# Patient Record
Sex: Female | Born: 1985 | Hispanic: Yes | Marital: Married | Smoking: Never smoker
Health system: Southern US, Community
[De-identification: ages and names within clinical notes are randomized; demographics above are authoritative.]

## PROBLEM LIST (undated history)

## (undated) ENCOUNTER — Inpatient Hospital Stay (HOSPITAL_COMMUNITY): Payer: Self-pay

## (undated) ENCOUNTER — Emergency Department (HOSPITAL_COMMUNITY): Admission: EM | Payer: MEDICAID | Source: Home / Self Care

## (undated) ENCOUNTER — Emergency Department (HOSPITAL_COMMUNITY): Discharge status: Against Medical Advice | Payer: Medicaid Other

## (undated) DIAGNOSIS — K5792 Diverticulitis of intestine, part unspecified, without perforation or abscess without bleeding: Secondary | ICD-10-CM

## (undated) DIAGNOSIS — T783XXA Angioneurotic edema, initial encounter: Secondary | ICD-10-CM

## (undated) DIAGNOSIS — R7303 Prediabetes: Secondary | ICD-10-CM

## (undated) DIAGNOSIS — N12 Tubulo-interstitial nephritis, not specified as acute or chronic: Secondary | ICD-10-CM

## (undated) DIAGNOSIS — L509 Urticaria, unspecified: Secondary | ICD-10-CM

## (undated) DIAGNOSIS — J45909 Unspecified asthma, uncomplicated: Secondary | ICD-10-CM

## (undated) DIAGNOSIS — R112 Nausea with vomiting, unspecified: Secondary | ICD-10-CM

## (undated) DIAGNOSIS — K859 Acute pancreatitis without necrosis or infection, unspecified: Secondary | ICD-10-CM

## (undated) HISTORY — DX: Angioneurotic edema, initial encounter: T78.3XXA

## (undated) HISTORY — PX: CHOLECYSTECTOMY: SHX55

## (undated) HISTORY — PX: TUBAL LIGATION: SHX77

## (undated) HISTORY — DX: Urticaria, unspecified: L50.9

---

## 2005-05-21 ENCOUNTER — Emergency Department (HOSPITAL_COMMUNITY): Admission: EM | Admit: 2005-05-21 | Discharge: 2005-05-21 | Payer: Self-pay | Admitting: Emergency Medicine

## 2007-02-14 ENCOUNTER — Emergency Department (HOSPITAL_COMMUNITY): Admission: EM | Admit: 2007-02-14 | Discharge: 2007-02-15 | Payer: Self-pay | Admitting: *Deleted

## 2007-08-28 ENCOUNTER — Emergency Department (HOSPITAL_COMMUNITY): Admission: EM | Admit: 2007-08-28 | Discharge: 2007-08-28 | Payer: Self-pay | Admitting: Emergency Medicine

## 2007-11-08 ENCOUNTER — Emergency Department (HOSPITAL_COMMUNITY): Admission: EM | Admit: 2007-11-08 | Discharge: 2007-11-09 | Payer: Self-pay | Admitting: Emergency Medicine

## 2007-11-09 ENCOUNTER — Inpatient Hospital Stay (HOSPITAL_COMMUNITY): Admission: AD | Admit: 2007-11-09 | Discharge: 2007-11-09 | Payer: Self-pay | Admitting: Obstetrics & Gynecology

## 2007-12-15 ENCOUNTER — Ambulatory Visit (HOSPITAL_COMMUNITY): Admission: RE | Admit: 2007-12-15 | Discharge: 2007-12-15 | Payer: Self-pay | Admitting: Obstetrics & Gynecology

## 2008-01-15 ENCOUNTER — Ambulatory Visit (HOSPITAL_COMMUNITY): Admission: RE | Admit: 2008-01-15 | Discharge: 2008-01-15 | Payer: Self-pay | Admitting: Obstetrics and Gynecology

## 2008-02-17 ENCOUNTER — Inpatient Hospital Stay (HOSPITAL_COMMUNITY): Admission: AD | Admit: 2008-02-17 | Discharge: 2008-02-17 | Payer: Self-pay | Admitting: Gynecology

## 2008-02-17 ENCOUNTER — Ambulatory Visit: Payer: Self-pay | Admitting: Gynecology

## 2008-02-26 ENCOUNTER — Ambulatory Visit: Payer: Self-pay | Admitting: Obstetrics & Gynecology

## 2008-03-04 ENCOUNTER — Ambulatory Visit: Payer: Self-pay | Admitting: Obstetrics and Gynecology

## 2008-03-05 ENCOUNTER — Inpatient Hospital Stay (HOSPITAL_COMMUNITY): Admission: AD | Admit: 2008-03-05 | Discharge: 2008-03-05 | Payer: Self-pay | Admitting: Family Medicine

## 2008-03-11 ENCOUNTER — Ambulatory Visit: Payer: Self-pay | Admitting: Obstetrics & Gynecology

## 2008-03-18 ENCOUNTER — Ambulatory Visit: Payer: Self-pay | Admitting: Obstetrics & Gynecology

## 2008-04-01 ENCOUNTER — Ambulatory Visit: Payer: Self-pay | Admitting: Obstetrics & Gynecology

## 2008-04-08 ENCOUNTER — Ambulatory Visit: Payer: Self-pay | Admitting: Obstetrics & Gynecology

## 2008-04-11 ENCOUNTER — Ambulatory Visit: Payer: Self-pay | Admitting: Gynecology

## 2008-04-11 ENCOUNTER — Inpatient Hospital Stay (HOSPITAL_COMMUNITY): Admission: AD | Admit: 2008-04-11 | Discharge: 2008-04-11 | Payer: Self-pay | Admitting: Gynecology

## 2008-04-15 ENCOUNTER — Ambulatory Visit: Payer: Self-pay | Admitting: Obstetrics & Gynecology

## 2008-04-22 ENCOUNTER — Inpatient Hospital Stay (HOSPITAL_COMMUNITY): Admission: AD | Admit: 2008-04-22 | Discharge: 2008-04-22 | Payer: Self-pay | Admitting: Obstetrics & Gynecology

## 2008-04-22 ENCOUNTER — Ambulatory Visit: Payer: Self-pay | Admitting: Obstetrics & Gynecology

## 2008-04-29 ENCOUNTER — Ambulatory Visit: Payer: Self-pay | Admitting: Obstetrics & Gynecology

## 2008-05-02 ENCOUNTER — Ambulatory Visit: Payer: Self-pay | Admitting: Obstetrics and Gynecology

## 2008-05-02 ENCOUNTER — Inpatient Hospital Stay (HOSPITAL_COMMUNITY): Admission: AD | Admit: 2008-05-02 | Discharge: 2008-05-02 | Payer: Self-pay | Admitting: Obstetrics & Gynecology

## 2008-05-06 ENCOUNTER — Ambulatory Visit: Payer: Self-pay | Admitting: Obstetrics & Gynecology

## 2008-05-09 ENCOUNTER — Inpatient Hospital Stay (HOSPITAL_COMMUNITY): Admission: AD | Admit: 2008-05-09 | Discharge: 2008-05-09 | Payer: Self-pay | Admitting: Family Medicine

## 2008-05-13 ENCOUNTER — Ambulatory Visit: Payer: Self-pay | Admitting: Obstetrics & Gynecology

## 2008-05-19 ENCOUNTER — Inpatient Hospital Stay (HOSPITAL_COMMUNITY): Admission: AD | Admit: 2008-05-19 | Discharge: 2008-05-21 | Payer: Self-pay | Admitting: Obstetrics & Gynecology

## 2008-05-19 ENCOUNTER — Ambulatory Visit: Payer: Self-pay | Admitting: Obstetrics & Gynecology

## 2008-07-26 ENCOUNTER — Emergency Department (HOSPITAL_COMMUNITY): Admission: EM | Admit: 2008-07-26 | Discharge: 2008-07-26 | Payer: Self-pay | Admitting: Emergency Medicine

## 2008-11-15 ENCOUNTER — Emergency Department (HOSPITAL_COMMUNITY): Admission: EM | Admit: 2008-11-15 | Discharge: 2008-11-15 | Payer: Self-pay | Admitting: Emergency Medicine

## 2008-12-09 ENCOUNTER — Emergency Department (HOSPITAL_COMMUNITY): Admission: EM | Admit: 2008-12-09 | Discharge: 2008-12-09 | Payer: Self-pay | Admitting: Emergency Medicine

## 2008-12-28 ENCOUNTER — Inpatient Hospital Stay (HOSPITAL_COMMUNITY): Admission: EM | Admit: 2008-12-28 | Discharge: 2008-12-31 | Payer: Self-pay | Admitting: Emergency Medicine

## 2008-12-28 ENCOUNTER — Ambulatory Visit: Payer: Self-pay | Admitting: Family Medicine

## 2008-12-28 ENCOUNTER — Emergency Department (HOSPITAL_COMMUNITY): Admission: EM | Admit: 2008-12-28 | Discharge: 2008-12-28 | Payer: Self-pay | Admitting: Emergency Medicine

## 2008-12-30 ENCOUNTER — Encounter (INDEPENDENT_AMBULATORY_CARE_PROVIDER_SITE_OTHER): Payer: Self-pay | Admitting: General Surgery

## 2008-12-30 ENCOUNTER — Encounter: Payer: Self-pay | Admitting: Family Medicine

## 2009-03-30 ENCOUNTER — Inpatient Hospital Stay (HOSPITAL_COMMUNITY): Admission: AD | Admit: 2009-03-30 | Discharge: 2009-03-30 | Payer: Self-pay | Admitting: Obstetrics & Gynecology

## 2009-04-07 ENCOUNTER — Inpatient Hospital Stay (HOSPITAL_COMMUNITY): Admission: AD | Admit: 2009-04-07 | Discharge: 2009-04-07 | Payer: Self-pay | Admitting: Obstetrics & Gynecology

## 2009-04-14 ENCOUNTER — Emergency Department (HOSPITAL_COMMUNITY): Admission: EM | Admit: 2009-04-14 | Discharge: 2009-04-15 | Payer: Self-pay | Admitting: Emergency Medicine

## 2009-04-15 ENCOUNTER — Ambulatory Visit: Payer: Self-pay | Admitting: Psychiatry

## 2009-04-15 ENCOUNTER — Inpatient Hospital Stay (HOSPITAL_COMMUNITY): Admission: AD | Admit: 2009-04-15 | Discharge: 2009-04-17 | Payer: Self-pay | Admitting: Psychiatry

## 2009-09-29 ENCOUNTER — Inpatient Hospital Stay (HOSPITAL_COMMUNITY): Admission: AD | Admit: 2009-09-29 | Discharge: 2009-09-29 | Payer: Self-pay | Admitting: Obstetrics and Gynecology

## 2009-11-30 ENCOUNTER — Inpatient Hospital Stay (HOSPITAL_COMMUNITY): Admission: AD | Admit: 2009-11-30 | Discharge: 2009-11-30 | Payer: Self-pay | Admitting: Obstetrics and Gynecology

## 2009-12-01 ENCOUNTER — Inpatient Hospital Stay (HOSPITAL_COMMUNITY): Admission: AD | Admit: 2009-12-01 | Discharge: 2009-12-01 | Payer: Self-pay | Admitting: Obstetrics & Gynecology

## 2009-12-03 ENCOUNTER — Ambulatory Visit: Payer: Self-pay | Admitting: Obstetrics and Gynecology

## 2009-12-03 ENCOUNTER — Inpatient Hospital Stay (HOSPITAL_COMMUNITY): Admission: AD | Admit: 2009-12-03 | Discharge: 2009-12-03 | Payer: Self-pay | Admitting: Obstetrics & Gynecology

## 2009-12-16 ENCOUNTER — Ambulatory Visit: Payer: Self-pay | Admitting: Obstetrics & Gynecology

## 2010-06-05 ENCOUNTER — Emergency Department (HOSPITAL_COMMUNITY): Admission: EM | Admit: 2010-06-05 | Discharge: 2010-06-05 | Payer: Self-pay | Admitting: Emergency Medicine

## 2010-08-30 ENCOUNTER — Inpatient Hospital Stay (HOSPITAL_COMMUNITY)
Admission: AD | Admit: 2010-08-30 | Discharge: 2010-08-30 | Payer: Self-pay | Source: Home / Self Care | Attending: Obstetrics & Gynecology | Admitting: Obstetrics & Gynecology

## 2010-11-28 LAB — URINALYSIS, ROUTINE W REFLEX MICROSCOPIC
Bilirubin Urine: NEGATIVE
Ketones, ur: NEGATIVE mg/dL
Leukocytes, UA: NEGATIVE
Nitrite: NEGATIVE
pH: 6 (ref 5.0–8.0)

## 2010-11-28 LAB — CBC
Hemoglobin: 12.8 g/dL (ref 12.0–15.0)
MCH: 30.8 pg (ref 26.0–34.0)
Platelets: 325 10*3/uL (ref 150–400)
RBC: 4.15 MIL/uL (ref 3.87–5.11)
WBC: 14.2 10*3/uL — ABNORMAL HIGH (ref 4.0–10.5)

## 2010-11-28 LAB — WET PREP, GENITAL
Clue Cells Wet Prep HPF POC: NONE SEEN
Trich, Wet Prep: NONE SEEN

## 2010-11-28 LAB — POCT PREGNANCY, URINE: Preg Test, Ur: NEGATIVE

## 2010-11-28 LAB — GC/CHLAMYDIA PROBE AMP, GENITAL: Chlamydia, DNA Probe: NEGATIVE

## 2010-11-30 LAB — URINALYSIS, ROUTINE W REFLEX MICROSCOPIC
Glucose, UA: NEGATIVE mg/dL
Ketones, ur: NEGATIVE mg/dL
Nitrite: NEGATIVE
Urobilinogen, UA: 0.2 mg/dL (ref 0.0–1.0)

## 2010-11-30 LAB — DIFFERENTIAL
Lymphs Abs: 3.1 10*3/uL (ref 0.7–4.0)
Monocytes Relative: 4 % (ref 3–12)
Neutro Abs: 5.5 10*3/uL (ref 1.7–7.7)
Neutrophils Relative %: 61 % (ref 43–77)

## 2010-11-30 LAB — URINE CULTURE

## 2010-11-30 LAB — BASIC METABOLIC PANEL
Calcium: 9 mg/dL (ref 8.4–10.5)
GFR calc Af Amer: >60 mL/min (ref >60)
GFR calc non Af Amer: >60 mL/min (ref >60)
Sodium: 140 mEq/L (ref 135–145)

## 2010-11-30 LAB — CBC
Hemoglobin: 13 g/dL (ref 12.0–15.0)
RBC: 4.21 MIL/uL (ref 3.87–5.11)
WBC: 9.1 10*3/uL (ref 4.0–10.5)

## 2010-12-03 LAB — DIFFERENTIAL
Eosinophils Relative: 1 % (ref 0–5)
Lymphocytes Relative: 32 % (ref 12–46)
Lymphs Abs: 2.7 10*3/uL (ref 0.7–4.0)
Neutro Abs: 5.1 10*3/uL (ref 1.7–7.7)

## 2010-12-03 LAB — CBC
HCT: 36.1 % (ref 36.0–46.0)
Hemoglobin: 12 g/dL (ref 12.0–15.0)
Platelets: 226 10*3/uL (ref 150–400)
WBC: 8.4 10*3/uL (ref 4.0–10.5)

## 2010-12-03 LAB — URINALYSIS, ROUTINE W REFLEX MICROSCOPIC
Glucose, UA: NEGATIVE mg/dL
Nitrite: NEGATIVE
Protein, ur: NEGATIVE mg/dL
pH: 5.5 (ref 5.0–8.0)

## 2010-12-03 LAB — WET PREP, GENITAL
Trich, Wet Prep: NONE SEEN
Yeast Wet Prep HPF POC: NONE SEEN

## 2010-12-03 LAB — GC/CHLAMYDIA PROBE AMP, GENITAL: GC Probe Amp, Genital: NEGATIVE

## 2010-12-06 LAB — POCT PREGNANCY, URINE: Preg Test, Ur: NEGATIVE

## 2010-12-10 LAB — URINALYSIS, ROUTINE W REFLEX MICROSCOPIC
Bilirubin Urine: NEGATIVE
Glucose, UA: NEGATIVE mg/dL
Ketones, ur: NEGATIVE mg/dL
Protein, ur: NEGATIVE mg/dL
pH: 6 (ref 5.0–8.0)

## 2010-12-10 LAB — ABO/RH: ABO/RH(D): O POS

## 2010-12-10 LAB — URINE CULTURE

## 2010-12-10 LAB — CBC
Hemoglobin: 12 g/dL (ref 12.0–15.0)
MCHC: 34 g/dL (ref 30.0–36.0)
MCV: 90.4 fL (ref 78.0–100.0)
RBC: 3.96 MIL/uL (ref 3.87–5.11)
RBC: 4.04 MIL/uL (ref 3.87–5.11)
WBC: 10.3 10*3/uL (ref 4.0–10.5)

## 2010-12-10 LAB — WET PREP, GENITAL
Clue Cells Wet Prep HPF POC: NONE SEEN
Trich, Wet Prep: NONE SEEN

## 2010-12-10 LAB — HCG, QUANTITATIVE, PREGNANCY: hCG, Beta Chain, Quant, S: 1779 m[IU]/mL — ABNORMAL HIGH (ref <5)

## 2010-12-10 LAB — GC/CHLAMYDIA PROBE AMP, GENITAL
Chlamydia, DNA Probe: NEGATIVE
GC Probe Amp, Genital: NEGATIVE

## 2010-12-10 LAB — URINE MICROSCOPIC-ADD ON

## 2010-12-10 LAB — POCT PREGNANCY, URINE: Preg Test, Ur: POSITIVE

## 2010-12-24 LAB — CBC
HCT: 35 % — ABNORMAL LOW (ref 36.0–46.0)
HCT: 33.9 % — ABNORMAL LOW (ref 36.0–46.0)
Hemoglobin: 11.6 g/dL — ABNORMAL LOW (ref 12.0–15.0)
MCV: 84.9 fL (ref 78.0–100.0)
MCV: 84.9 fL (ref 78.0–100.0)
Platelets: 263 10*3/uL (ref 150–400)
Platelets: 287 10*3/uL (ref 150–400)
RBC: 4.12 MIL/uL (ref 3.87–5.11)
RDW: 13.5 % (ref 11.5–15.5)
WBC: 10.2 10*3/uL (ref 4.0–10.5)
WBC: 7.8 10*3/uL (ref 4.0–10.5)
WBC: 8.5 10*3/uL (ref 4.0–10.5)

## 2010-12-24 LAB — WET PREP, GENITAL
Trich, Wet Prep: NONE SEEN
Yeast Wet Prep HPF POC: NONE SEEN

## 2010-12-24 LAB — URINALYSIS, ROUTINE W REFLEX MICROSCOPIC
Glucose, UA: NEGATIVE mg/dL
Glucose, UA: NEGATIVE mg/dL
Glucose, UA: NEGATIVE mg/dL
Ketones, ur: NEGATIVE mg/dL
Ketones, ur: NEGATIVE mg/dL
Nitrite: NEGATIVE
Protein, ur: NEGATIVE mg/dL
Protein, ur: NEGATIVE mg/dL
Specific Gravity, Urine: 1.027 (ref 1.005–1.030)
Urobilinogen, UA: 0.2 mg/dL (ref 0.0–1.0)
Urobilinogen, UA: 0.2 mg/dL (ref 0.0–1.0)
pH: 6.5 (ref 5.0–8.0)

## 2010-12-24 LAB — BASIC METABOLIC PANEL
Calcium: 8.9 mg/dL (ref 8.4–10.5)
GFR calc Af Amer: >60 mL/min (ref >60)
GFR calc non Af Amer: >60 mL/min (ref >60)
Glucose, Bld: 93 mg/dL (ref 70–99)
Potassium: 3.8 mEq/L (ref 3.5–5.1)
Sodium: 135 mEq/L (ref 135–145)

## 2010-12-24 LAB — URINE CULTURE

## 2010-12-24 LAB — DIFFERENTIAL
Basophils Relative: 0 % (ref 0–1)
Blasts: 0 %
Eosinophils Absolute: 0.1 10*3/uL (ref 0.0–0.7)
Eosinophils Relative: 2 % (ref 0–5)
Eosinophils Relative: 1 % (ref 0–5)
Lymphocytes Relative: 34 % (ref 12–46)
Lymphs Abs: 3.1 10*3/uL (ref 0.7–4.0)
Lymphs Abs: 2.6 10*3/uL (ref 0.7–4.0)
Metamyelocytes Relative: 0 %
Monocytes Absolute: 0.4 10*3/uL (ref 0.1–1.0)
Monocytes Relative: 6 % (ref 3–12)
Myelocytes: 0 %
Neutro Abs: 4.5 10*3/uL (ref 1.7–7.7)
Neutro Abs: 5 10*3/uL (ref 1.7–7.7)
Neutrophils Relative %: 63 % (ref 43–77)
Neutrophils Relative %: 59 % (ref 43–77)
nRBC: 0 /100 WBC

## 2010-12-24 LAB — ETHANOL: Alcohol, Ethyl (B): <5 mg/dL (ref 0–10)

## 2010-12-24 LAB — URINE MICROSCOPIC-ADD ON: RBC / HPF: NONE SEEN RBC/hpf (ref <3)

## 2010-12-27 LAB — COMPREHENSIVE METABOLIC PANEL
ALT: 48 U/L — ABNORMAL HIGH (ref 0–35)
AST: 80 U/L — ABNORMAL HIGH (ref 0–37)
Albumin: 3 g/dL — ABNORMAL LOW (ref 3.5–5.2)
Alkaline Phosphatase: 102 U/L (ref 39–117)
Alkaline Phosphatase: 76 U/L (ref 39–117)
BUN: 5 mg/dL — ABNORMAL LOW (ref 6–23)
BUN: 6 mg/dL (ref 6–23)
CO2: 27 mEq/L (ref 19–32)
CO2: 24 mEq/L (ref 19–32)
Calcium: 9.3 mg/dL (ref 8.4–10.5)
Chloride: 102 mEq/L (ref 96–112)
Chloride: 108 mEq/L (ref 96–112)
Chloride: 110 mEq/L (ref 96–112)
Creatinine, Ser: 0.8 mg/dL (ref 0.4–1.2)
GFR calc Af Amer: >60 mL/min (ref >60)
GFR calc non Af Amer: >60 mL/min (ref >60)
GFR calc non Af Amer: >60 mL/min (ref >60)
Glucose, Bld: 127 mg/dL — ABNORMAL HIGH (ref 70–99)
Glucose, Bld: 107 mg/dL — ABNORMAL HIGH (ref 70–99)
Potassium: 3.3 mEq/L — ABNORMAL LOW (ref 3.5–5.1)
Total Bilirubin: 0.1 mg/dL — ABNORMAL LOW (ref 0.3–1.2)
Total Bilirubin: 0.3 mg/dL (ref 0.3–1.2)
Total Bilirubin: 0.5 mg/dL (ref 0.3–1.2)

## 2010-12-27 LAB — CBC
HCT: 33.8 % — ABNORMAL LOW (ref 36.0–46.0)
HCT: 31.7 % — ABNORMAL LOW (ref 36.0–46.0)
Hemoglobin: 11.2 g/dL — ABNORMAL LOW (ref 12.0–15.0)
Hemoglobin: 10.6 g/dL — ABNORMAL LOW (ref 12.0–15.0)
MCHC: 33 g/dL (ref 30.0–36.0)
MCHC: 32.9 g/dL (ref 30.0–36.0)
MCHC: 33.9 g/dL (ref 30.0–36.0)
MCV: 86.6 fL (ref 78.0–100.0)
MCV: 84.8 fL (ref 78.0–100.0)
MCV: 85.5 fL (ref 78.0–100.0)
Platelets: 226 10*3/uL (ref 150–400)
Platelets: 324 10*3/uL (ref 150–400)
RBC: 3.91 MIL/uL (ref 3.87–5.11)
RBC: 3.7 MIL/uL — ABNORMAL LOW (ref 3.87–5.11)
RDW: 15 % (ref 11.5–15.5)
WBC: 15.5 10*3/uL — ABNORMAL HIGH (ref 4.0–10.5)
WBC: 6.5 10*3/uL (ref 4.0–10.5)
WBC: 7.3 10*3/uL (ref 4.0–10.5)
WBC: 9.9 10*3/uL (ref 4.0–10.5)

## 2010-12-27 LAB — BASIC METABOLIC PANEL
BUN: 15 mg/dL (ref 6–23)
Calcium: 9.8 mg/dL (ref 8.4–10.5)
Chloride: 106 mEq/L (ref 96–112)
Creatinine, Ser: 0.75 mg/dL (ref 0.4–1.2)

## 2010-12-27 LAB — URINALYSIS, ROUTINE W REFLEX MICROSCOPIC
Hgb urine dipstick: NEGATIVE
Nitrite: NEGATIVE
Protein, ur: NEGATIVE mg/dL
Specific Gravity, Urine: 1.036 — ABNORMAL HIGH (ref 1.005–1.030)
Urobilinogen, UA: 0.2 mg/dL (ref 0.0–1.0)

## 2010-12-27 LAB — DIFFERENTIAL
Basophils Relative: 0 % (ref 0–1)
Eosinophils Absolute: 0.2 10*3/uL (ref 0.0–0.7)
Lymphs Abs: 3.5 10*3/uL (ref 0.7–4.0)
Neutro Abs: 5.5 10*3/uL (ref 1.7–7.7)
Neutrophils Relative %: 56 % (ref 43–77)

## 2010-12-27 LAB — HEPATIC FUNCTION PANEL
Albumin: 4.1 g/dL (ref 3.5–5.2)
Total Protein: 7.8 g/dL (ref 6.0–8.3)

## 2010-12-27 LAB — MAGNESIUM: Magnesium: 1.8 mg/dL (ref 1.5–2.5)

## 2010-12-27 LAB — LIPASE, BLOOD: Lipase: 29 U/L (ref 11–59)

## 2010-12-27 LAB — POCT PREGNANCY, URINE: Preg Test, Ur: NEGATIVE

## 2010-12-28 LAB — COMPREHENSIVE METABOLIC PANEL
ALT: 32 U/L (ref 0–35)
ALT: 48 U/L — ABNORMAL HIGH (ref 0–35)
AST: 30 U/L (ref 0–37)
BUN: 15 mg/dL (ref 6–23)
CO2: 20 mEq/L (ref 19–32)
Calcium: 8.2 mg/dL — ABNORMAL LOW (ref 8.4–10.5)
Calcium: 9 mg/dL (ref 8.4–10.5)
GFR calc Af Amer: >60 mL/min (ref >60)
GFR calc non Af Amer: >60 mL/min (ref >60)
Glucose, Bld: 84 mg/dL (ref 70–99)
Sodium: 140 mEq/L (ref 135–145)
Sodium: 138 mEq/L (ref 135–145)
Total Protein: 6.5 g/dL (ref 6.0–8.3)

## 2010-12-28 LAB — LIPASE, BLOOD: Lipase: 22 U/L (ref 11–59)

## 2010-12-28 LAB — URINALYSIS, ROUTINE W REFLEX MICROSCOPIC
Ketones, ur: NEGATIVE mg/dL
Ketones, ur: NEGATIVE mg/dL
Nitrite: NEGATIVE
Nitrite: NEGATIVE
Protein, ur: NEGATIVE mg/dL
Protein, ur: NEGATIVE mg/dL
Urobilinogen, UA: 0.2 mg/dL (ref 0.0–1.0)

## 2010-12-28 LAB — CBC
HCT: 32 % — ABNORMAL LOW (ref 36.0–46.0)
Hemoglobin: 10.9 g/dL — ABNORMAL LOW (ref 12.0–15.0)
MCHC: 34.1 g/dL (ref 30.0–36.0)
MCHC: 34.2 g/dL (ref 30.0–36.0)
MCV: 85.1 fL (ref 78.0–100.0)
RBC: 3.76 MIL/uL — ABNORMAL LOW (ref 3.87–5.11)
RDW: 14.3 % (ref 11.5–15.5)

## 2010-12-28 LAB — URINE MICROSCOPIC-ADD ON

## 2010-12-28 LAB — DIFFERENTIAL
Basophils Relative: 0 % (ref 0–1)
Eosinophils Absolute: 0 10*3/uL (ref 0.0–0.7)
Eosinophils Absolute: 0 10*3/uL (ref 0.0–0.7)
Eosinophils Relative: 1 % (ref 0–5)
Lymphs Abs: 3.1 10*3/uL (ref 0.7–4.0)
Lymphs Abs: 2.1 10*3/uL (ref 0.7–4.0)
Monocytes Relative: 5 % (ref 3–12)
Neutrophils Relative %: 62 % (ref 43–77)
Neutrophils Relative %: 67 % (ref 43–77)
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
## 2011-01-30 NOTE — H&P (Signed)
NAMESTORM, DULSKI                   ACCOUNT NO.:  [id number]   MEDICAL RECORD NO.:  [id number]           PATIENT TYPE:   LOCATION:                                 FACILITY:   PHYSICIAN:  Nestor Ramp, MD        DATE OF BIRTH:  1986/05/24   DATE OF ADMISSION:  DATE OF DISCHARGE:                              HISTORY & PHYSICAL   PRIMARY CARE PHYSICIAN:  Unassigned, the patient does not have primary  care physician.   CHIEF COMPLAINT:  Abdominal pain, vomiting.   HISTORY OF PRESENT ILLNESS:  This is a 25 year old female with past  medical history significant for biliary colic, who presents with  complaints of severe abdominal pain x24 hours.  The patient states that  pain started at 4 p.m. yesterday.  It is located in right upper quadrant  and similar to prior episode of biliary colic.  The patient came to the  ED on morning of admission for abdominal pain, was to be discharged,  then started having yellow emesis.  Of note, the patient has  cholecystectomy scheduled for December 30, 2008, with [facility].  The patient  denies fever, bloody stools.  Does report diarrhea x3 days.  The patient  has had this recurrent right upper quadrant pain since the birth of her  last child.   PAST MEDICAL HISTORY:  Prior ED visits for biliary colic.   PAST SURGICAL HISTORY:  C section x2.   ALLERGIES:  No known drug allergies.   MEDICATIONS:  None.   SOCIAL HISTORY:  The patient lives with her 2 children, denies tobacco,  alcohol, or drug use.   FAMILY HISTORY:  Noncontributory.   REVIEW OF SYSTEMS:  Positive for chills, sweats, decreased appetite,  nausea, vomiting, diarrhea, abdominal pain.  Review of systems negative  for headache, sore throat, chest pain, edema, orthopnea, cough, dyspnea,  wheezing, sputum, dysphagia, hematemesis, bright red blood per rectum,  melena, rash, jaundice, or myalgia or neurological changes.   CODE STATUS:  Full code.   PHYSICAL EXAMINATION:  VITAL SIGNS:   Temperature 96.6-97.5, pulse 74-78,  respiratory rate 18-20, blood pressure 106-133/63-75, pulse ox 100% on  room air.  GENERAL:  NAD, lying down on bed, appears comfortable.  HEENT:  NCAT, EOMI, PERRL, no facial jaundice, no scleral icterus, no  sublingual jaundice, dry mucous membranes without erythema or exudate.  NECK:  No lymphadenopathy.  CARDIOVASCULAR:  Regular rate and rhythm.  No murmurs, rubs, or gallops.  No JVD.  No lower extremity edema.  A 2+ radial and pedal pulses.  LUNGS:  Clear to auscultation bilaterally.  ABDOMEN:  Diffusely tender with positive Murphy sign on exam, no  rebound, no guarding, normoactive bowel sounds, no organomegaly noted.  EXTREMITIES:  Warm and well perfused with pulses as above.  NEUROLOGIC:  Cranial nerves II through XII intact.  MUSCULOSKELETAL:  A 5/5 strength in all extremities.  SKIN:  No jaundice or rash noted.   LABORATORY DATA AND STUDIES:  Sodium 140, potassium 3.7, chloride 106,  bicarb 26, BUN 15, creatinine 0.75, glucose  106.  Alkaline phosphatase  120, AST 41, ALT 55, lipase 29, total bilirubin 0.4.  Urinalysis notable  for specific gravity of 1.036 and ketones of 15, otherwise, within  normal limits.  White blood cell count 9.9, hemoglobin 13.1, platelets  324.  Pregnancy test negative.  Abdominal ultrasound with  cholelithiasis.  No biliary ductal dilatation.  Fatty liver.   This is a 25 year old female with past medical history significant for  recurrent biliary colic, who presents with cholelithiasis with right  upper quadrant pain, nausea, and vomiting.  1. Cholelithiasis:  We will contact [facility] Surgery Service given that the      patient was scheduled for cholecystectomy on December 30, 2008.  We      will give morphine for pain.  Phenergan for nausea.  We will make      the patient n.p.o. with bowel rest for now.  No ductal dilatation      on ultrasound, no cholecystitis.  The Surgery to see and evaluate      for possibility of  cholecystectomy while the patient is in-house.  2. Fluids, electrolytes, and nutrition/gastrointestinal:  We will      maintain n.p.o. for possible surgery.  Electrolytes within normal      limits.  D5 half normal saline at 150 mL an hour.  3. Prophylaxis:  SCDs, Protonix.  4. Disposition:  Pending Surgery consult.  The patient may possibly be      transferred to the surgery service from our service given that she      currently does not have any medical issues that need to be managed      at this time.      Bobby Rumpf, MD  Electronically Signed      Nestor Ramp, MD  Electronically Signed    KC/MEDQ  D:  12/28/2008  T:  12/29/2008  Job:  [id number]

## 2011-01-30 NOTE — Op Note (Signed)
NAMEEMALIA, WITKOP           ACCOUNT NO.:  [id number]   MEDICAL RECORD NO.:  [id number]         PATIENT TYPE:  WINP   LOCATION:  WOC                           [facility]ACILITY:  [facility]   PHYSICIAN:  Allie Bossier, MD        DATE OF BIRTH:  April 28, 1986   DATE OF PROCEDURE:  05/19/2008  DATE OF DISCHARGE:  05/21/2008                               OPERATIVE REPORT   PREOPERATIVE DIAGNOSES:  1. Intrauterine pregnancy at 91 and 4/[redacted] weeks gestational age in      active labor.  2. History of classical cesarean section.   POSTOPERATIVE DIAGNOSES:  1. Intrauterine pregnancy at 7 and 4/[redacted] weeks gestational age in      active labor.  2. History of classical cesarean section.   PROCEDURE:  A repeat low-transverse cesarean section.   SURGEON:  Allie Bossier, MD   ASSISTANT:  Odie Sera, DO   ANESTHESIA:  Spinal and local.   INDICATIONS FOR PROCEDURE:  Lorraine [name] is a 25 year old gravida  3, para 0-1-1-0 who presented at 36-4/[redacted] weeks gestation in active labor.  Because of a history of classical cesarean section, it was recommended  that she have a repeat low-transverse cesarean section.  The risks and  benefits of the procedure were explained to the patient with the use of  an interpreter to include but not limited to bleeding, infection, or  injury to internal organs.  The patient agreed to proceed to cesarean  section.   DESCRIPTION OF PROCEDURE:  The patient was taken to the operating room  where spinal anesthesia was placed.  The patient was prepped and draped  in the usual sterile manner and a time-out was conducted.  Appropriate  anesthesia was confirmed.  A low-transverse Pfannenstiel incision was  made with a scalpel and carried down to the fascial layers.  The fascia  was incised in the midline with the scalpel.  The fascial incision was  extended laterally with scissors.  The fascia was bluntly separated from  the abdominal rectus muscles.  The rectus muscles were dissected  with  electrocautery at the midpoint transversely approximately one-third of  the way across.  The peritoneum was entered bluntly with a Kelly clamp.  Upon entering the peritoneum, the bladder blade was placed.  The lower  uterine segment was noted to be extremely thin and an occult dehiscence  of the prior uterine incision was noted.  A transverse uterine incision  was made with the scalpel and the final layers were bluntly entered with  the Kelly forceps.  Intact membranes were noted and ruptured noting  clear fluid.  The baby's head was elevated out of the pelvis and  required vacuum assistance to deliver the head.  The head was bulb  suctioned and then the anterior and posterior shoulder and rest of the  corpus were delivered in the usual manner.  The baby was bulb suctioned,  had a spontaneous cry.  The cord was clamped and cut and handed to the  NICU team.  The placenta was manually extracted intact and had a three-  vessel cord.  After the  placenta was extracted, the uterine incision was  closed in a running interlocking manner with 0 Monocryl suture.  Upon  closure of the uterine incision, good hemostasis was noted.  The ovaries  and tubes were inspected and noted be grossly normal.  Hemostasis of the  rectus musculature was noted.  The fascia was closed with a running non  interlocking PDS suture.  Upon closure of the fascia, the subcutaneous  layer was irrigated and noted to be hemostatic.  30 mL total of 0.5%  Marcaine was injected in the superior and inferior aspects of the  incision.  The skin was closed with a running subcuticular 2-0 Vicryl  suture.  Steri-Strips and a pressure dressing were placed. The foley  catheter drained clear urine throughout the case.   FINDINGS:  1. Clear amniotic fluid.  2. A viable infant.   SPECIMENS:  Placenta intact.   DISPOSITION:  To Labor and Delivery.   ESTIMATED BLOOD LOSS:  700 mL.   COMPLICATIONS:  None immediate.  The patient  tolerated the procedure  well and was taken to the PACU in good condition.      Odie Sera, DO      Allie Bossier, MD  Electronically Signed    MC/MEDQ  D:  05/19/2008  T:  05/20/2008  Job:  [id number]

## 2011-01-30 NOTE — Discharge Summary (Signed)
Lorraine [name], Lorraine [name]                   ACCOUNT NO.:  [id number]   MEDICAL RECORD NO.:  [id number]          PATIENT TYPE:  INP   LOCATION:  5123                         FACILITY:  [facility]   PHYSICIAN:  Paula Compton, MD        DATE OF BIRTH:  12-31-1985   DATE OF ADMISSION:  12/28/2008  DATE OF DISCHARGE:  12/31/2008                               DISCHARGE SUMMARY   PRIMARY CARE PHYSICIAN:  1. The patient will follow up with Dr. Asher Muir at [facility] pending approval of new patient status.  2. The patient will follow up with Dr. Freida Busman of Surgery in 2-3 weeks.   DISCHARGE DIAGNOSIS:  Cholelithiasis.   DISCHARGE MEDICATIONS:  Percocet 5/325 mg p.o. q.4 h. p.r.n. pain.   CONSULTS:  Surgery, Dr. Freida Busman.   LABORATORY DATA ON ADMISSION:  White blood cell 9.9, hemoglobin 13.1,  hematocrit 39.8, platelets 324 with normal differential.  Sodium 140,  potassium 3.7, chloride 106, CO2 26, glucose 106, BUN 15, creatinine  0.75, total bilirubin 0.4, alkaline phosphatase 120, AST 41, ALT 55,  total protein 7.8, albumin 4.1, calcium 9.8, lipase 29, new coag  negative.  Urinalysis notable for specific gravity of 1.036, ketones 15.  The patient's AST trended from 41 on admission to 34-80 post surgery.  The patient's ALT trended from 55-46 and 48-101 post surgery.  Remainder  of the patient's labs remained stable except for an increase in the  patient's white blood cell count 15.5 following surgery.   STUDIES:  Ultrasound of the abdomen on December 28, 2008 showed  cholelithiasis, no biliary ductal dilatation, fatty liver, no positive  ultrasound Murphy's sign, wall thickness of gallbladder 2.4 cm.  Intraoperative cholangiogram performed on December 30, 2008 showed negative  for retained common duct stone.   BRIEF HOSPITAL COURSE:  This is a 25 year old female who presented with  past medical history of biliary colic with known cholelithiasis,  scheduled for elective  cholecystectomy on December 30, 2008 presented with  nausea, vomiting, right upper quadrant pain.  Cholelithiasis.  The patient presented with nausea, vomiting and right  upper quadrant pain x1 day as above.  The patient scheduled for elective  cholecystectomy on December 30, 2008.  Surgery was consulted and  cholecystectomy was performed on December 30, 2008.  The patient was  started on Unasyn preoperatively.  The patient initially had some emesis  post procedure, however, tolerated p.o. intake well on the night and  morning following surgery and was discharged 1 day postop.  The patient  was discharged with Percocet for home medications for pain control.  The  patient was afebrile, vital signs stable for course of hospitalization.  The patient will follow up with Dr. Freida Busman in 2-3  weeks, was given instructions regarding no heavy lifting, was given  instructions regarding low fat diet.  The patient was discharged home in  stable and improved condition, afebrile, vital signs stable, tolerating  p.o. intake, ambulating well, and urinating.      Bobby Rumpf, MD  Electronically Signed  Paula Compton, MD  Electronically Signed    KC/MEDQ  D:  01/01/2009  T:  01/02/2009  Job:  [id number]

## 2011-01-30 NOTE — Discharge Summary (Signed)
Lorraine [name], Lorraine [name]           ACCOUNT NO.:  [id number]   MEDICAL RECORD NO.:  [id number]          PATIENT TYPE:  INP   LOCATION:  9119                          [facility]   PHYSICIAN:  Norton Blizzard, MD    DATE OF BIRTH:  11-Oct-1985   DATE OF ADMISSION:  05/19/2008  DATE OF DISCHARGE:  05/21/2008                               DISCHARGE SUMMARY   REASON FOR ADMISSION:  Cesarean section.   PRENATAL PROCEDURES:  1. Nonstress test.  2. Ultrasound.   INTRAPARTUM PROCEDURES:  Repeat low transverse cesarean section.   POSTPARTUM PROCEDURES:  None.   COMPLICATIONS:  None.   SIGNIFICANT FINDINGS:  This was a 25 year old G3, P0-2-1-2 for RLTCS on  May 19, 2008.  The patient had a history of classical cesarean  section.  The patient was at 28.2 EGA female infant.  No complications  during surgery, please refer to op note for further details.  The  patient was discharged to home on May 21, 2008, will follow up with  Health Department in 6 weeks.  Hemoglobin was 9.6 on May 21, 2008,  will send home on iron and stool softener.  The patient requested  Micronor for contraception.  Staples to be removed by baby love nurse at  earliest convenience on May 25, 2008.  The patient encouraged to  have pelvic rest x6 weeks.  We will prescribe ibuprofen p.r.n. for pain.  The patient is breast and bottle feeding.   PERTINENT LABS:  Blood type O positive.  Antibody negative.  GBS status  unknown.  Rubella immune.  Hemoglobin 9.6 on day of discharge.   DISCHARGE DIAGNOSIS:  Repeat low transverse cesarean section at 36.[redacted]  weeks EGA due to prior history of C-section.   DISCHARGE INSTRUCTIONS:  The patient was discharged to home in stable  condition.  The patient was given instructions for pelvic rest x6 weeks.  The patient will follow up with [facility] Department in 6  weeks for postpartum followup.      Bobby Rumpf, MD  Electronically Signed     ______________________________  Norton Blizzard, MD    KC/MEDQ  D:  06/25/2008  T:  06/26/2008  Job:  [id number]

## 2011-01-30 NOTE — Consult Note (Signed)
Lorraine [name], Lorraine [name]                   ACCOUNT NO.:  [id number]   MEDICAL RECORD NO.:  [id number]          PATIENT TYPE:  INP   LOCATION:  5123                         FACILITY:  [facility]   PHYSICIAN:  Velora Heckler, MD      DATE OF BIRTH:  May 11, 1986   DATE OF CONSULTATION:  12/28/2008  DATE OF DISCHARGE:                                 CONSULTATION   PRIMARY CARE PHYSICIAN:  Unassigned.   REQUESTING PHYSICIAN:  Roxine Caddy. Jennette Kettle, MD   CHIEF COMPLAINT:  Abdominal pain, nausea, and vomiting.   HISTORY OF PRESENT ILLNESS:  The patient is a 25 year old female with  multiple episodes of biliary colic in the past seen by Dr. Freida Busman at  [facility] with plans for a laparoscopic cholecystectomy  on December 30, 2008.  However, the patient presented to the emergency room  yesterday, December 28, 2008 with nausea, vomiting, and right upper  quadrant abdominal pain.  This episode is similar to previous episodes  of biliary colic.  The patient reports no fevers.  She had an ultrasound  which revealed cholelithiasis and a common bile duct that was within  normal limits.   PAST MEDICAL HISTORY:  The patient has no recorded past medical history,  however, on her history and physical from Dr. Eliot Ford office, there is a  mention that she had been taking glimepiride.  When I asked the patient  about this, she states she was taking glimepiride but ran out of it.  However, when I asked her if she has a history of diabetes or  gestational diabetes, she denies this.  So, I am not certain about her  full past medical history.   PAST SURGICAL HISTORY:  She has had two C-sections, one in 2006 and one  in 2009.   ALLERGIES:  She has no known drug allergies.   FAMILY MEDICAL HISTORY:  Significant for diabetes and peptic ulcer  disease.   SOCIAL HISTORY:  She lives with her husband and two children.  She is a  stay-at-home mother.  She does not smoke or drink any alcohol.   PHYSICAL EXAMINATION:  VITAL  SIGNS:  Temperature 98.4, pulse 78,  respirations 18, blood pressure 100/54, and O2 sat 97% on room air.  GENERAL:  She is sleepy, lying in bed.  ABDOMEN:  She is soft.  She has mild-to-moderate tenderness to palpation  throughout her abdomen, a little bit worse in the right upper quadrant  and epigastrium but no true Murphy sign.  She has positive bowel sounds.  CARDIOVASCULAR:  Regular rate and rhythm.  No murmur.  PULMONARY:  Clear to auscultation bilaterally.  EXTREMITIES:  No clubbing, cyanosis, or edema.   LABORATORY DATA AND STUDIES:  BMET:  Sodium 140, potassium 3.3, chloride  110, bicarb 24, BUN 6, creatinine 0.66, and glucose 107.  White blood  cell 6.5, hemoglobin 10.6, hematocrit 31.7, and platelets 229.  Total  bilirubin 0.5, alk phos 76, AST 36, ALT 46, lipase 29, total protein  5.6, albumin 3.0, and calcium 8.6.  Abdominal ultrasound done yesterday  showed:  1. Cholelithiasis.  2. No biliary ductal dilation.  3. Fatty liver.  4. Common bile duct measuring 4.5 mm.   ASSESSMENT AND PLAN:  A 25 year old female with cholelithiasis.  1. Cholelithiasis.  We will talk with Dr. Freida Busman this morning who was      originally scheduled to perform her laparoscopic cholecystectomy on      December 30, 2008 which is tomorrow.  We will plan for a laparoscopic      cholecystectomy and a possible intraoperative cholangiogram.  2. Hypokalemia.  We will replete with IV potassium.  3. Fluids, electrolytes, and nutrition.  Once we figure out when her      surgery will be scheduled, we will call her nurse back, give her an      update and update her diet.  If she is not having her surgery until      tomorrow, she should be able to eat this afternoon and then be      n.p.o. over the midnight.  4. Anemia.  Management per primary team.  5. Prophylaxis.  Management per primary team.      Asher Muir, MD  Electronically Signed      Velora Heckler, MD  Electronically Signed     SO/MEDQ  D:  12/29/2008  T:  12/29/2008  Job:  [id number]   cc:   Lennie Muckle, MD

## 2011-01-30 NOTE — Discharge Summary (Signed)
[name], [name]                   ACCOUNT NO.:  [id number]   MEDICAL RECORD NO.:  [id number]          PATIENT TYPE:  INP   LOCATION:  5123                         FACILITY:  [facility]   PHYSICIAN:  Lennie Muckle, MD      DATE OF BIRTH:  1986/05/06   DATE OF ADMISSION:  12/28/2008  DATE OF DISCHARGE:  12/30/2008                               DISCHARGE SUMMARY   [name] is a 25 year old female who had  been scheduled electively for  laparoscopic cholecystectomy on the 15th, she came in on the 13th, with  acute onset of abdominal pain.  This was unrelenting, she therefore, was  admitted with a biliary colic, possible cholecystitis.  She was placed  on Unasyn preoperatively, kept comfortable with IV narcotics and  successfully had a laparoscopic cholecystectomy on December 30, 2008,  uneventful postoperative course and is discharged home with Percocet for  pain.  Instructed to perform no heavy lifting and will follow up with me  in 2-3 weeks.   CONDITION ON DISCHARGE:  Improved.   FINAL DIAGNOSIS:  Biliary colic.      Lennie Muckle, MD  Electronically Signed     ALA/MEDQ  D:  12/30/2008  T:  12/31/2008  Job:  [id number]

## 2011-01-30 NOTE — Op Note (Signed)
NAMEKEYATTA, TOLLES                   ACCOUNT NO.:  [id number]   MEDICAL RECORD NO.:  [id number]          PATIENT TYPE:  INP   LOCATION:  5123                         FACILITY:  [facility]   PHYSICIAN:  Lennie Muckle, MD      DATE OF BIRTH:  August 26, 1986   DATE OF PROCEDURE:  12/30/2008  DATE OF DISCHARGE:                               OPERATIVE REPORT   PREOPERATIVE DIAGNOSIS:  Biliary colic.   POSTOPERATIVE DIAGNOSIS:  Biliary colic.   PROCEDURE:  Laparoscopic cholecystectomy with cholangiogram.   SURGEON:  Amber L. Freida Busman, MD   ASSISTANT:  None.   ANESTHESIA:  General endotracheal anesthesia.   FINDINGS:  Patent common bile duct, 1 stone within the gallbladder.   COMPLICATIONS:  No immediate complications.   DRAINS:  No drains were placed.   Minimal amount of blood loss.   INDICATIONS FOR PROCEDURE:  Ms. Carlynn Purl is a 25 year old female I had seen  in the office for frequent episodes of epigastric right upper quadrant  pain, diagnosis was consistent with biliary colic.  She had been  admitted on December 28, 2008, due to an acute episode of abdominal pain.  She had a slight rise in her liver enzymes and almost completely  normalized.  Due to the admission date being close to her hospital stay,  she was kept for IV antibiotics and IV narcotics.  She underwent  laparoscopic cholecystectomy on December 30, 2008.   DETAILS OF PROCEDURE:  Ms. Carlynn Purl was identified in the preoperative  holding area.  She had been on Unasyn preoperatively.  All questions  were answered.  She was then taken to the operating suite.  Once in the  operating room, she was placed in supine position.  After administration  of general endotracheal anesthesia, her abdomen was prepped and draped  in the usual sterile fashion.  A time-out procedure indicating the  patient and procedure were performed.  I placed a small incision above  the umbilicus, grasped this with a towel clip, and placed Veress needle  into the  abdominal cavity.  After adequate pneumoinsufflation, I placed  a 5-mm trocar using the OptiVu into the abdominal cavity.  All layers of  the abdominal wall were visualized upon entry.  I inspected the abdomen  and found no evidence of injury upon placement of Veress needle and the  trocar.  I then placed an 11-mm trocar at the epigastric region under  visualization with a camera.  Two 5-mm trocars were placed in the right  upper quadrant.  Gallbladder was then identified in the normal anatomic  position.  The patient was placed in reversed Trendelenburg right side  up.  Fundus of the gallbladder was retracted in the patient.  I grasped  the infundibulum away from the liver bed using electrocautery and the  [facility] forceps.  I dissected out critical view of the cystic duct and  the cystic artery.  I placed a clip distally on the cystic duct,  partially transected, and performed cholangiogram.  Flow went easily  into the duodenum.  Right and left hepatic ducts  were visualized.  I  then placed 3 clips proximally and transected the cystic duct after  removing the catheter.  I then clipped the cystic artery, which was  posterior.  I had continued dissecting the peritoneum and the  gallbladder with a hook electrocautery.  Gallbladder was placed in an  EndoCatch bag and removed from the epigastric region.  I inspected the  liver bed and found no evidence of bleeding or bile leakage.  I  irrigated approximately 500 mL of saline.  Final inspection of liver bed  revealed no bleeding.  No evidence of injury in the abdomen.  I closed  the fascial defect at the epigastric region using 0 Vicryl and using  laparoscopic suture passer.  Final inspection of the abdomen again  revealed no injury and no bleeding.  I removed the trocars after  releasing the pneumoperitoneum.  Gallbladder was passed off the  operative field and will be sent to the pathology.  The skin was closed  with 4-0 Monocryl.  Dermabond  placed for final dressing.  The patient  was extubated and transported to the postanesthesia care unit in stable  condition.  She will be discharged home later today.  Follow up with me  in approximately 2 or 3 weeks.      Lennie Muckle, MD  Electronically Signed     ALA/MEDQ  D:  12/30/2008  T:  12/31/2008  Job:  [id number]

## 2011-06-11 LAB — URINALYSIS, ROUTINE W REFLEX MICROSCOPIC
Ketones, ur: 40 — AB
Nitrite: NEGATIVE
Urobilinogen, UA: 0.2
pH: 6

## 2011-06-11 LAB — WET PREP, GENITAL: Yeast Wet Prep HPF POC: NONE SEEN

## 2011-06-14 LAB — D-DIMER, QUANTITATIVE: D-Dimer, Quant: 1.49 — ABNORMAL HIGH

## 2011-06-14 LAB — CBC
HCT: 30.9 — ABNORMAL LOW
Hemoglobin: 10.9 — ABNORMAL LOW
MCHC: 35.2
RBC: 3.34 — ABNORMAL LOW
RDW: 13.4

## 2011-06-14 LAB — COMPREHENSIVE METABOLIC PANEL
BUN: 5 — ABNORMAL LOW
CO2: 22
Calcium: 9.1
Creatinine, Ser: 0.52
GFR calc Af Amer: >60
GFR calc non Af Amer: >60
Glucose, Bld: 110 — ABNORMAL HIGH

## 2011-06-14 LAB — URINALYSIS, ROUTINE W REFLEX MICROSCOPIC
Bilirubin Urine: NEGATIVE
Glucose, UA: NEGATIVE
Glucose, UA: NEGATIVE
Hgb urine dipstick: NEGATIVE
Hgb urine dipstick: NEGATIVE
Ketones, ur: NEGATIVE
Ketones, ur: NEGATIVE
Protein, ur: NEGATIVE
Protein, ur: NEGATIVE
pH: 6.5

## 2011-06-14 LAB — FIBRINOGEN: Fibrinogen: 620 — ABNORMAL HIGH

## 2011-06-14 LAB — URINE MICROSCOPIC-ADD ON

## 2011-06-14 LAB — URINE CULTURE: Colony Count: 45000

## 2011-06-14 LAB — POCT URINALYSIS DIP (DEVICE)
Glucose, UA: NEGATIVE
Hgb urine dipstick: NEGATIVE
Ketones, ur: NEGATIVE
Nitrite: NEGATIVE
Operator id: 15968
Operator id: 297281
Operator id: 297281
Protein, ur: 30 — AB
Protein, ur: NEGATIVE
Protein, ur: NEGATIVE
Specific Gravity, Urine: 1.02
Specific Gravity, Urine: 1.025
Urobilinogen, UA: 0.2
Urobilinogen, UA: 0.2
Urobilinogen, UA: 1
pH: 6.5
pH: 6.5
pH: 6

## 2011-06-14 LAB — DIFFERENTIAL
Basophils Absolute: 0
Lymphocytes Relative: 17
Lymphs Abs: 1.9
Neutro Abs: 8.7 — ABNORMAL HIGH
Neutrophils Relative %: 79 — ABNORMAL HIGH

## 2011-06-14 LAB — FETAL FIBRONECTIN: Fetal Fibronectin: NEGATIVE

## 2011-06-14 LAB — LIPASE, BLOOD: Lipase: 18

## 2011-06-15 LAB — POCT URINALYSIS DIP (DEVICE)
Bilirubin Urine: NEGATIVE
Bilirubin Urine: NEGATIVE
Hgb urine dipstick: NEGATIVE
Ketones, ur: NEGATIVE
Ketones, ur: NEGATIVE
Ketones, ur: NEGATIVE
Ketones, ur: NEGATIVE
Operator id: 159681
Operator id: 297281
Protein, ur: 30 — AB
Protein, ur: NEGATIVE
Protein, ur: NEGATIVE
Protein, ur: 30 — AB
Protein, ur: NEGATIVE
Specific Gravity, Urine: 1.015
Specific Gravity, Urine: 1.015
Specific Gravity, Urine: 1.02
Specific Gravity, Urine: 1.025
Specific Gravity, Urine: 1.025
Urobilinogen, UA: 0.2
Urobilinogen, UA: 0.2
Urobilinogen, UA: 0.2
pH: 6.5
pH: 6.5
pH: 6.5
pH: 6
pH: 6

## 2011-06-15 LAB — URINALYSIS, ROUTINE W REFLEX MICROSCOPIC
Bilirubin Urine: NEGATIVE
Glucose, UA: NEGATIVE
Glucose, UA: NEGATIVE
Hgb urine dipstick: NEGATIVE
Hgb urine dipstick: NEGATIVE
Ketones, ur: NEGATIVE
Ketones, ur: NEGATIVE
Protein, ur: NEGATIVE
Protein, ur: NEGATIVE
Urobilinogen, UA: 0.2
pH: 6.5

## 2011-06-15 LAB — WET PREP, GENITAL
Clue Cells Wet Prep HPF POC: NONE SEEN
Trich, Wet Prep: NONE SEEN
Yeast Wet Prep HPF POC: NONE SEEN

## 2011-06-19 LAB — URINALYSIS, ROUTINE W REFLEX MICROSCOPIC
Nitrite: NEGATIVE
Specific Gravity, Urine: 1.014
Urobilinogen, UA: 1
pH: 6

## 2011-06-19 LAB — POCT I-STAT, CHEM 8
BUN: 13
Calcium, Ion: 1.13
Chloride: 106

## 2011-06-19 LAB — POCT PREGNANCY, URINE: Preg Test, Ur: NEGATIVE

## 2011-06-19 LAB — DIFFERENTIAL
Eosinophils Absolute: 0.1
Lymphocytes Relative: 31
Lymphs Abs: 3.1
Neutro Abs: 6.6
Neutrophils Relative %: 65

## 2011-06-19 LAB — HEPATIC FUNCTION PANEL
AST: 70 — ABNORMAL HIGH
Albumin: 3.8
Alkaline Phosphatase: 145 — ABNORMAL HIGH
Total Protein: 7.2

## 2011-06-19 LAB — LIPASE, BLOOD: Lipase: 36

## 2011-06-19 LAB — CBC
Platelets: 300
WBC: 10.2

## 2011-06-19 LAB — URINE CULTURE

## 2011-06-20 LAB — URINALYSIS, ROUTINE W REFLEX MICROSCOPIC
Glucose, UA: NEGATIVE
Ketones, ur: NEGATIVE
pH: 6.5

## 2011-06-20 LAB — CBC
HCT: 32.4 — ABNORMAL LOW
Hemoglobin: 10.7 — ABNORMAL LOW
Hemoglobin: 9.8 — ABNORMAL LOW
MCHC: 32.8
MCHC: 32.8
RBC: 3.3 — ABNORMAL LOW
RDW: 13.9
WBC: 8
WBC: 9.7

## 2011-06-20 LAB — TYPE AND SCREEN
ABO/RH(D): O POS
Antibody Screen: NEGATIVE

## 2011-06-20 LAB — DIFFERENTIAL
Eosinophils Relative: 1
Lymphocytes Relative: 27
Lymphs Abs: 2.2
Monocytes Absolute: 0.4
Monocytes Relative: 5

## 2012-09-09 ENCOUNTER — Ambulatory Visit (HOSPITAL_COMMUNITY): Payer: Self-pay

## 2012-09-09 ENCOUNTER — Emergency Department (HOSPITAL_COMMUNITY): Payer: Self-pay

## 2012-09-09 ENCOUNTER — Encounter (HOSPITAL_COMMUNITY): Payer: Self-pay | Admitting: *Deleted

## 2012-09-09 ENCOUNTER — Inpatient Hospital Stay (HOSPITAL_COMMUNITY)
Admission: EM | Admit: 2012-09-09 | Discharge: 2012-09-12 | DRG: 392 | Disposition: A | Discharge status: Home / Self Care | Payer: Medicaid Other | Attending: Internal Medicine | Admitting: Internal Medicine

## 2012-09-09 DIAGNOSIS — B373 Candidiasis of vulva and vagina: Secondary | ICD-10-CM

## 2012-09-09 DIAGNOSIS — E860 Dehydration: Secondary | ICD-10-CM | POA: Diagnosis present

## 2012-09-09 DIAGNOSIS — K5792 Diverticulitis of intestine, part unspecified, without perforation or abscess without bleeding: Secondary | ICD-10-CM

## 2012-09-09 DIAGNOSIS — R109 Unspecified abdominal pain: Secondary | ICD-10-CM

## 2012-09-09 DIAGNOSIS — R112 Nausea with vomiting, unspecified: Secondary | ICD-10-CM | POA: Diagnosis present

## 2012-09-09 DIAGNOSIS — R1115 Cyclical vomiting syndrome unrelated to migraine: Secondary | ICD-10-CM | POA: Diagnosis present

## 2012-09-09 DIAGNOSIS — B3731 Acute candidiasis of vulva and vagina: Secondary | ICD-10-CM | POA: Diagnosis present

## 2012-09-09 DIAGNOSIS — K5732 Diverticulitis of large intestine without perforation or abscess without bleeding: Principal | ICD-10-CM | POA: Diagnosis present

## 2012-09-09 DIAGNOSIS — B9689 Other specified bacterial agents as the cause of diseases classified elsewhere: Secondary | ICD-10-CM

## 2012-09-09 DIAGNOSIS — Z975 Presence of (intrauterine) contraceptive device: Secondary | ICD-10-CM

## 2012-09-09 LAB — COMPREHENSIVE METABOLIC PANEL
ALT: 21 U/L (ref 0–35)
Alkaline Phosphatase: 100 U/L (ref 39–117)
CO2: 26 mEq/L (ref 19–32)
Calcium: 9 mg/dL (ref 8.4–10.5)
GFR calc Af Amer: >90 mL/min (ref >90)
GFR calc non Af Amer: >90 mL/min (ref >90)
Glucose, Bld: 98 mg/dL (ref 70–99)
Potassium: 3.7 mEq/L (ref 3.5–5.1)
Sodium: 137 mEq/L (ref 135–145)
Total Bilirubin: 0.3 mg/dL (ref 0.3–1.2)

## 2012-09-09 LAB — URINALYSIS, MICROSCOPIC ONLY
Bilirubin Urine: NEGATIVE
Ketones, ur: NEGATIVE mg/dL
Nitrite: NEGATIVE
Protein, ur: NEGATIVE mg/dL
Urobilinogen, UA: 1 mg/dL (ref 0.0–1.0)

## 2012-09-09 LAB — CBC WITH DIFFERENTIAL/PLATELET
Eosinophils Relative: 1 % (ref 0–5)
Lymphocytes Relative: 26 % (ref 12–46)
Lymphs Abs: 2.8 10*3/uL (ref 0.7–4.0)
MCV: 89.2 fL (ref 78.0–100.0)
Platelets: 291 10*3/uL (ref 150–400)
RBC: 4.24 MIL/uL (ref 3.87–5.11)
WBC: 10.8 10*3/uL — ABNORMAL HIGH (ref 4.0–10.5)

## 2012-09-09 LAB — WET PREP, GENITAL: Trich, Wet Prep: NONE SEEN

## 2012-09-09 MED ORDER — LORAZEPAM 2 MG/ML IJ SOLN
1.0000 mg | Freq: Once | INTRAMUSCULAR | Status: AC
  Administered 2012-09-09: 1 mg via INTRAVENOUS
  Filled 2012-09-09: qty 1

## 2012-09-09 MED ORDER — METRONIDAZOLE IN NACL 5-0.79 MG/ML-% IV SOLN
500.0000 mg | Freq: Once | INTRAVENOUS | Status: AC
  Administered 2012-09-09: 500 mg via INTRAVENOUS
  Filled 2012-09-09: qty 100

## 2012-09-09 MED ORDER — CIPROFLOXACIN HCL 500 MG PO TABS: 500.0000 mg | ORAL_TABLET | Freq: Two times a day (BID) | ORAL | Status: DC

## 2012-09-09 MED ORDER — FLUCONAZOLE 150 MG PO TABS
150.0000 mg | ORAL_TABLET | Freq: Once | ORAL | Status: AC
  Administered 2012-09-09: 150 mg via ORAL
  Filled 2012-09-09: qty 1

## 2012-09-09 MED ORDER — METRONIDAZOLE 500 MG PO TABS: 500.0000 mg | ORAL_TABLET | Freq: Two times a day (BID) | ORAL | Status: DC

## 2012-09-09 MED ORDER — HYDROMORPHONE HCL PF 1 MG/ML IJ SOLN
1.0000 mg | Freq: Once | INTRAMUSCULAR | Status: AC
  Administered 2012-09-09: 1 mg via INTRAVENOUS
  Filled 2012-09-09: qty 1

## 2012-09-09 MED ORDER — HYDROCODONE-ACETAMINOPHEN 5-325 MG PO TABS: 1.0000 | ORAL_TABLET | ORAL | Status: DC | PRN

## 2012-09-09 MED ORDER — CIPROFLOXACIN IN D5W 400 MG/200ML IV SOLN
400.0000 mg | Freq: Once | INTRAVENOUS | Status: DC
  Filled 2012-09-09: qty 200

## 2012-09-09 MED ORDER — ONDANSETRON HCL 4 MG/2ML IJ SOLN
4.0000 mg | Freq: Once | INTRAMUSCULAR | Status: AC
  Administered 2012-09-09: 4 mg via INTRAVENOUS
  Filled 2012-09-09: qty 2

## 2012-09-09 MED ORDER — IOHEXOL 300 MG/ML  SOLN
100.0000 mL | Freq: Once | INTRAMUSCULAR | Status: AC | PRN
  Administered 2012-09-09: 100 mL via INTRAVENOUS

## 2012-09-09 MED ORDER — ONDANSETRON HCL 8 MG PO TABS: 8.0000 mg | ORAL_TABLET | Freq: Three times a day (TID) | ORAL | Status: DC | PRN

## 2012-09-09 NOTE — ED Notes (Signed)
Pt still with n/v PA informed. PA and RN in room with interpreter phone to talk with pt and pt's family about possible admission.

## 2012-09-09 NOTE — H&P (Signed)
[facility] History and Physical  Leafy Motsinger YNW:[id number] DOB: 1986-09-11    PCP:   No PCP.  Chief Complaint: Abdominal pain, nausea and vomiting.  HPI: Lorraine [name] is an 26 y.o. hispanic female, and hx is taken by me through the interpreter service, with no prior significant PMH on no chronic medication, amenorrheic due to her IUD, presents to the ER with 5 days Hx of abdominal pain, nausea, vomiting with no diarrhea.  She didn't have fever, chills, HA, coughs, SOB, or chest pain.  She has unremarkable BM, and has no black or bloody stool.  She denied vaginal discharge.  She never had similar episode, was n't taken any new medicine, had no distant travel, and has no ill contact.  She denied tobacco, alcohol nor drug use.  She has had 2 children and is married.  Evaluation in the ER showed normal WBC, unremarkable electrolytes, renal fx tests, lipase, and LFTs. An abd/pelvic CT showed moderate to severe bladder distention and 6cm segment of inflammed descending colon most consistent with diverticulitis.  She was given Cipro and Flagyl and hospitalist was asked to admit her for persistent vomiting.  Rewiew of Systems:  Constitutional: Negative for malaise, fever and chills. No significant weight loss or weight gain Eyes: Negative for eye pain, redness and discharge, diplopia, visual changes, or flashes of light. ENMT: Negative for ear pain, hoarseness, nasal congestion, sinus pressure and sore throat. No headaches; tinnitus, drooling, or problem swallowing. Cardiovascular: Negative for chest pain, palpitations, diaphoresis, dyspnea and peripheral edema. ; No orthopnea, PND Respiratory: Negative for cough, hemoptysis, wheezing and stridor. No pleuritic chestpain. Gastrointestinal: Negative for  constipation,  melena, blood in stool, hematemesis, jaundice and rectal bleeding.    Genitourinary: Negative for frequency, dysuria, incontinence,flank pain and  hematuria; Musculoskeletal: Negative for back pain and neck pain. Negative for swelling and trauma.;  Skin: . Negative for pruritus, rash, abrasions, bruising and skin lesion.; ulcerations Neuro: Negative for headache, lightheadedness and neck stiffness. Negative for weakness, altered level of consciousness , altered mental status, extremity weakness, burning feet, involuntary movement, seizure and syncope.  Psych: negative for anxiety, depression, insomnia, tearfulness, panic attacks, hallucinations, paranoia, suicidal or homicidal ideation    Past Medical History  Diagnosis Date  . No pertinent past medical history     Past Surgical History  Procedure Date  . Cholecystectomy     Medications:  HOME MEDS: Prior to Admission medications   Medication Sig Start Date End Date Taking? Authorizing Provider  ciprofloxacin (CIPRO) 500 MG tablet Take 1 tablet (500 mg total) by mouth 2 (two) times daily. One po bid x 7 days 09/09/12   Arthor Captain, PA-C  HYDROcodone-acetaminophen (NORCO) 5-325 MG per tablet Take 1-2 tablets by mouth every 4 (four) hours as needed for pain. 09/09/12   Arthor Captain, PA-C  metroNIDAZOLE (FLAGYL) 500 MG tablet Take 1 tablet (500 mg total) by mouth 2 (two) times daily. One po bid x 7 days 09/09/12   Arthor Captain, PA-C  ondansetron (ZOFRAN) 8 MG tablet Take 1 tablet (8 mg total) by mouth every 8 (eight) hours as needed for nausea. 09/09/12   Arthor Captain, PA-C     Allergies:  No Known Allergies  Social History:   reports that she has never smoked. She has never used smokeless tobacco. She reports that she does not drink alcohol or use illicit drugs.  Family History: History reviewed. No pertinent family history.   Physical Exam: Filed Vitals:   09/09/12 0944 09/09/12 1110  09/09/12 1925  BP: 127/71 108/48 125/81  Pulse: 85 77 119  Temp: 98 F (36.7 C)    TempSrc: Oral    Resp: 18 17 16   SpO2: 100% 100% 98%   Blood pressure 125/81, pulse 119,  temperature 98 F (36.7 C), temperature source Oral, resp. rate 16, SpO2 98.00%.  GEN:  Pleasant  patient lying in the stretcher in no acute distress; cooperative with exam. PSYCH:  alert and oriented x4; does not appear anxious or depressed; affect is appropriate. HEENT: Mucous membranes pink and anicteric; PERRLA; EOM intact; no cervical lymphadenopathy nor thyromegaly or carotid bruit; no JVD; There were no stridor. Neck is very supple. Breasts:: Not examined CHEST WALL: No tenderness CHEST: Normal respiration, clear to auscultation bilaterally.  HEART: Regular rate and rhythm.  There are no murmur, rub, or gallops.   BACK: No kyphosis or scoliosis; no CVA tenderness ABDOMEN: soft and slightly tender in LLQ.  no masses, no organomegaly, normal abdominal bowel sounds; no pannus; no intertriginous candida. There is no rebound and no distention. Rectal Exam: Not done EXTREMITIES: No bone or joint deformity; age-appropriate arthropathy of the hands and knees; no edema; no ulcerations.  There is no calf tenderness. Genitalia: not examined PULSES: 2+ and symmetric SKIN: Normal hydration no rash or ulceration CNS: Cranial nerves 2-12 grossly intact no focal lateralizing neurologic deficit.  Speech is fluent; uvula elevated with phonation, facial symmetry and tongue midline. DTR are normal bilaterally, cerebella exam is intact, barbinski is negative and strengths are equaled bilaterally.  No sensory loss.   Labs on Admission:  Basic Metabolic Panel:  Lab 09/09/12 7829  NA 137  K 3.7  CL 103  CO2 26  GLUCOSE 98  BUN 13  CREATININE 0.73  CALCIUM 9.0  MG --  PHOS --   Liver Function Tests:  Lab 09/09/12 1038  AST 15  ALT 21  ALKPHOS 100  BILITOT 0.3  PROT 7.2  ALBUMIN 3.7    Lab 09/09/12 1038  LIPASE 19  AMYLASE --   No results found for this basename: AMMONIA:5 in the last 168 hours CBC:  Lab 09/09/12 1038  WBC 10.8*  NEUTROABS 7.3  HGB 13.0  HCT 37.8  MCV 89.2  PLT  291   Cardiac Enzymes: No results found for this basename: CKTOTAL:5,CKMB:5,CKMBINDEX:5,TROPONINI:5 in the last 168 hours  CBG: No results found for this basename: GLUCAP:5 in the last 168 hours   Radiological Exams on Admission: Ct Abdomen Pelvis W Contrast  09/09/2012  *RADIOLOGY REPORT*  Clinical Data: 26 year old female left lower quadrant pain with vomiting.  CT ABDOMEN AND PELVIS WITH CONTRAST  Technique:  Multidetector CT imaging of the abdomen and pelvis was performed following the standard protocol during bolus administration of intravenous contrast.  Contrast: OMNIPAQUE IOHEXOL 300 MG/ML  SOLN  Comparison: Pelvic ultrasound 12/01/2009 and earlier.  Findings: Lung bases are clear.  No pericardial or pleural effusion. No osseous abnormality identified.  No pelvic free fluid.  IUD in place.  Uterus and adnexa otherwise normal.  The bladder is moderate to severely distended.  The rectum and distal sigmoid are within normal limits.  There is confluent inflammation in the mesentery surrounding the distal descending colon in the left lower quadrant, series 2 image 53, coronal image 57.  A segment of about 6 cm of colon is affected. There is evidence of diverticula in this region.  Mild diverticula in the proximal sigmoid which appears mostly spared.  Upstream left and transverse colon are  decompressed.  Right colon and appendix are normal.  Terminal ileum within normal limits.  No dilated small bowel.  Some left abdominal small bowel loops might be mildly secondarily affected.  Stomach and duodenum largely decompressed.  Gallbladder surgically absent.  Mildly decreased density throughout the liver.  Spleen, pancreas and adrenal glands are within normal limits.  Portal venous system and major arterial structures in the abdomen and pelvis are patent.  No abdominal free fluid or free air.  Both kidneys are within normal limits.  IMPRESSION: 1.  6 cm segment of inflamed distal descending colon and  mesentery most compatible with acute diverticulitis in this setting.  No free fluid or complicating features. 2.  Moderate to severely distended bladder.   Original Report Authenticated By: Erskine Speed, M.D.    Dg Abd Acute W/chest  09/09/2012  *RADIOLOGY REPORT*  Clinical Data: Abdominal pain  ACUTE ABDOMEN SERIES (ABDOMEN 2 VIEW & CHEST 1 VIEW)  Comparison: None.  Findings: Cardiomediastinal silhouette is unremarkable.  No acute infiltrate or pleural effusion.  No pulmonary edema.  There is nonspecific nonobstructive bowel gas pattern.  Post cholecystectomy surgical clips are noted.  IUD noted midline upper pelvis.  No free abdominal air  IMPRESSION: .  No active disease.  IUD midline pelvis.  Post cholecystectomy surgical clips.   Original Report Authenticated By: Natasha Mead, M.D.      Assessment/Plan Present on Admission:  . Abdominal pain . Nausea and vomiting . Dehydration  PLAN:  Will admit her for abdominal pain.  CT showed diverticulitis, but her clinical symptoms aren't exactly typical for this.  She should have regional colitis.  She doesn't have diarrhea at all however.  Will admit her for obs and tx her for presumptive Dx of diverticulitis with IV Cipro and Flagyl.  Will be sure she is able to void.  Will give IVF and Zofran for her nausea and vomiting.  She is stable and has been able to ambulate without abdominal pain.  I will obtain a urine pregnancy test just to be prudent.  Will admit her to general medical floor under [facility] service.  Thank you for allowing me to partake in the care of your nice patient.  Other plans as per orders.  Code Status: FULL Unk Lightning, MD. [facility] Pager [phone] 7pm to 7am.  09/09/2012, 9:52 PM

## 2012-09-09 NOTE — ED Provider Notes (Signed)
History     CSN: [id number]  Arrival date & time 09/09/12  6295   First MD Initiated Contact with Patient 09/09/12 1009      Chief Complaint  Patient presents with  . Flank Pain    (Consider location/radiation/quality/duration/timing/severity/associated sxs/prior treatment) HPI Comments: 26 year old female presents to emergency department with chief complaint of abdominal pain and flank pain.  Onset of pain was 5 days ago.  The pain has been worsening over the course.  She states that the pain is in the left lower quadrant and left flank.  It is constant but worse at times. She describes the pain as 10/10 ans states that it feels like a contraction during labor. Nohx constipation. Has associated Nausea Patient has Meridia IUD and does not get her periods.  SHe is sexyually active in a monoagamous relationsship. Previous hx c-section.  Denies fevers, chills, myalgias, arthralgias. Denies dysuria, flank pain, suprapubic pain, frequency, urgency, or hematuria.Denies history of UTI or Kidney stone.  Denies, vomiting, diarrhea or constipation, blood in stool. Denies vaginal symptoms.   Denies DOE, SOB, chest tightness or pressure, radiation to left arm, jaw or back, or diaphoresis. Denies headaches, light headedness, weakness, visual disturbances.    The history is provided by the patient. The history is limited by a language barrier. A language interpreter was used.    No past medical history on file.  No past surgical history on file.  No family history on file.  History  Substance Use Topics  . Smoking status: Not on file  . Smokeless tobacco: Not on file  . Alcohol Use: Not on file    OB History    No data available      Review of Systems Ten systems reviewed and are negative for acute change, except as noted in the HPI.    Allergies  Review of patient's allergies indicates not on file.  Home Medications  No current outpatient prescriptions on file.  BP 127/71   Pulse 85  Temp 98 F (36.7 C) (Oral)  Resp 18  SpO2 100%  Physical Exam  Constitutional: She is oriented to person, place, and time. She appears well-developed and well-nourished. She appears distressed.  HENT:  Head: Normocephalic and atraumatic.  Eyes: Conjunctivae normal are normal. No scleral icterus.  Neck: Normal range of motion.  Cardiovascular: Normal rate, regular rhythm and normal heart sounds.  Exam reveals no gallop and no friction rub.   No murmur heard. Pulmonary/Chest: Effort normal and breath sounds normal. No respiratory distress.  Abdominal: Soft. Bowel sounds are normal. She exhibits no distension and no mass. There is tenderness. There is guarding. There is no rebound.       +CVA tenderness bilaterally + heel strike   Neurological: She is alert and oriented to person, place, and time.  Skin: Skin is warm and dry. She is not diaphoretic.    ED Course  Procedures (including critical care time) Pelvic exam: normal external genitalia, vulva, vagina, cervix, uterus and adnexa, VULVA: normal appearing vulva with no masses, tenderness or lesions, VAGINA: normal appearing vagina with normal color and discharge, no lesions, CERVIX: normal appearing cervix without discharge or lesions, Blue IUD strings present and visible in the cervical OS, UTERUS: uterus is normal size, shape, consistency and nontender, ADNEXA: normal adnexa in size, nontender and no masses, exam chaperoned by Maple Hudson, A.  Labs Reviewed  CBC WITH DIFFERENTIAL - Abnormal; Notable for the following:    WBC 10.8 (*)     All  other components within normal limits  URINALYSIS, MICROSCOPIC ONLY - Abnormal; Notable for the following:    APPearance CLOUDY (*)     Leukocytes, UA SMALL (*)     All other components within normal limits  COMPREHENSIVE METABOLIC PANEL  LIPASE, BLOOD  WET PREP, GENITAL  GC/CHLAMYDIA PROBE AMP  URINE CULTURE   No results found.   No diagnosis found.    MDM  BP 127/71  Pulse  85  Temp 98 F (36.7 C) (Oral)  Resp 18  SpO2 100% Patient with sig. Pain and stable vital signs.  I am most concerned with kidney stone vs. UTI.   Patient has questionable perioteneal signs as she did c/o pain with heel strikes. Awaiting labs.   11:33 AM Patient with leukocytes present in Urine.  This may reflect kidney stone and inflammation aof the ureter vs. Intrabdominal process that is causeing inflammation of the Urinary tract. And mildly elevated white count. Normal pelvic exam.    4:26 PM Patient had difficulty taking down the contrast. CT scan shows acute diverticulitis without perforation. I have started IV abx.  I will also treat patient for vaginal yeast infection.  7:48 PM Filed Vitals:   09/09/12 0944 09/09/12 1110 09/09/12 1925  BP: 127/71 108/48 125/81  Pulse: 85 77 119  Temp: 98 F (36.7 C)    TempSrc: Oral    Resp: 18 17 16   SpO2: 100% 100% 98%   Patient continues to have intractable nausea. Heart rate is elevated.  I will consult With [facility] for admission.   I have spoken with Dr. Conley Rolls who has agreed to admit the patient for her intractable vomiting.  Arthor Captain, PA-C 09/10/12 [phone]

## 2012-09-09 NOTE — ED Notes (Signed)
Patient transported to CT 

## 2012-09-09 NOTE — ED Notes (Signed)
Left lower abd pain and left flank pain x 5 days.

## 2012-09-09 NOTE — ED Notes (Signed)
Radiology at bedside to bring oral contrast, will monitor.

## 2012-09-09 NOTE — ED Notes (Signed)
Informed [name] PA that pt's pain 10/10.  Instructed via telephone to reorder Dilaudid and zofran.

## 2012-09-10 DIAGNOSIS — K5792 Diverticulitis of intestine, part unspecified, without perforation or abscess without bleeding: Secondary | ICD-10-CM | POA: Diagnosis present

## 2012-09-10 LAB — GC/CHLAMYDIA PROBE AMP: CT Probe RNA: NEGATIVE

## 2012-09-10 MED ORDER — ONDANSETRON HCL 4 MG/2ML IJ SOLN
4.0000 mg | Freq: Four times a day (QID) | INTRAMUSCULAR | Status: DC | PRN
  Administered 2012-09-10: 4 mg via INTRAVENOUS
  Filled 2012-09-10: qty 2

## 2012-09-10 MED ORDER — LORAZEPAM 1 MG PO TABS
1.0000 mg | ORAL_TABLET | Freq: Three times a day (TID) | ORAL | Status: DC | PRN
  Filled 2012-09-10: qty 1

## 2012-09-10 MED ORDER — HYDROMORPHONE HCL PF 1 MG/ML IJ SOLN
0.5000 mg | INTRAMUSCULAR | Status: DC | PRN
  Administered 2012-09-10 – 2012-09-12 (×8): 1 mg via INTRAVENOUS
  Filled 2012-09-10 (×8): qty 1

## 2012-09-10 MED ORDER — KCL IN DEXTROSE-NACL 20-5-0.9 MEQ/L-%-% IV SOLN
INTRAVENOUS | Status: DC
  Administered 2012-09-10 – 2012-09-11 (×3): via INTRAVENOUS
  Administered 2012-09-11: 100 mL/h via INTRAVENOUS
  Filled 2012-09-10 (×9): qty 1000

## 2012-09-10 MED ORDER — LORAZEPAM 2 MG/ML IJ SOLN
1.0000 mg | Freq: Three times a day (TID) | INTRAMUSCULAR | Status: DC | PRN
  Administered 2012-09-10 – 2012-09-12 (×3): 1 mg via INTRAVENOUS
  Filled 2012-09-10 (×4): qty 1

## 2012-09-10 MED ORDER — METRONIDAZOLE IN NACL 5-0.79 MG/ML-% IV SOLN
500.0000 mg | Freq: Three times a day (TID) | INTRAVENOUS | Status: DC
  Administered 2012-09-10 – 2012-09-11 (×4): 500 mg via INTRAVENOUS
  Filled 2012-09-10 (×5): qty 100

## 2012-09-10 MED ORDER — PROMETHAZINE HCL 25 MG/ML IJ SOLN
12.5000 mg | INTRAMUSCULAR | Status: DC | PRN
  Administered 2012-09-10 – 2012-09-12 (×7): 12.5 mg via INTRAVENOUS
  Filled 2012-09-10 (×7): qty 1

## 2012-09-10 MED ORDER — CIPROFLOXACIN IN D5W 400 MG/200ML IV SOLN
400.0000 mg | Freq: Two times a day (BID) | INTRAVENOUS | Status: DC
  Administered 2012-09-10 – 2012-09-11 (×3): 400 mg via INTRAVENOUS
  Filled 2012-09-10 (×4): qty 200

## 2012-09-10 MED ORDER — ONDANSETRON HCL 4 MG PO TABS: 4.0000 mg | ORAL_TABLET | Freq: Four times a day (QID) | ORAL | Status: DC | PRN

## 2012-09-10 NOTE — Progress Notes (Signed)
[facility] PROGRESS NOTE  Mechell Girgis UJW:[id number] DOB: [birth year] DOA: 09/09/2012 PCP: No primary provider on file.  Brief narrative: Lorraine [name] is a 26 year old female with no significant past medical history who was admitted to the hospital on 09/09/2012 with acute diverticulitis. She has been placed on empiric Cipro and Flagyl.  Assessment/Plan: Principal Problem:  *Diverticulitis  Admitted and imaging studies showed acute diverticulitis.  On empiric Cipro and Flagyl. Active Problems:  Abdominal pain  Secondary to acute diverticulitis. Pain medication as needed.  Nausea and vomiting  Supportive care with IV fluids and antinausea medications.  Dehydration  Continue IV fluids.  IUD contraception   Code Status: Full Family Communication: Family updated at bedside. Disposition Plan: Home when stable.   Medical Consultants:  None.  Other Consultants:  None.  Anti-infectives:  Cipro 09/09/2012--->  Flagyl 09/09/2012--->  HPI/Subjective: Mrs. Paone still has some abdominal pain. No current complaints of nausea or vomiting, but has taken anti-nausea medications through the night. The nurses report that Zofran has been ineffective but Ativan has worked well. No complaints of diarrhea or blood in the stool.  Objective: Filed Vitals:   09/09/12 1925 09/10/12 0016 09/10/12 0033 09/10/12 0531  BP: 125/81 114/72 101/47 91/41  Pulse: 119 91 96 103  Temp:  97.7 F (36.5 C) 97.3 F (36.3 C) 98.7 F (37.1 C)  TempSrc:  Oral Oral Oral  Resp: 16  18 18   Height:   4' 11.06" (1.5 m)   Weight:   87.1 kg (192 lb 0.3 oz)   SpO2: 98% 99% 100% 97%    Intake/Output Summary (Last 24 hours) at 09/10/12 0732 Last data filed at 09/10/12 0400  Gross per 24 hour  Intake      0 ml  Output    950 ml  Net   -950 ml    Exam: Gen:  NAD Cardiovascular:  RRR, No M/R/G Respiratory:  Lungs CTAB Gastrointestinal:  Abdomen soft, mildly tender left  lower quadrant, + BS Extremities:  No C/E/C  Data Reviewed: Basic Metabolic Panel:  Lab 09/09/12 5621  NA 137  K 3.7  CL 103  CO2 26  GLUCOSE 98  BUN 13  CREATININE 0.73  CALCIUM 9.0  MG --  PHOS --   GFR Estimated Creatinine Clearance: 102.3 ml/min (by C-G formula based on Cr of 0.73). Liver Function Tests:  Lab 09/09/12 1038  AST 15  ALT 21  ALKPHOS 100  BILITOT 0.3  PROT 7.2  ALBUMIN 3.7    Lab 09/09/12 1038  LIPASE 19  AMYLASE --   CBC:  Lab 09/09/12 1038  WBC 10.8*  NEUTROABS 7.3  HGB 13.0  HCT 37.8  MCV 89.2  PLT 291   Microbiology Recent Results (from the past 240 hour(s))  WET PREP, GENITAL     Status: Abnormal   Collection Time   09/09/12 11:08 AM      Component Value Range Status Comment   Yeast Wet Prep HPF POC RARE (*) NONE SEEN Final    Trich, Wet Prep NONE SEEN  NONE SEEN Final    Clue Cells Wet Prep HPF POC RARE (*) NONE SEEN Final    WBC, Wet Prep HPF POC RARE (*) NONE SEEN Final   GC/CHLAMYDIA PROBE AMP     Status: Normal   Collection Time   09/09/12 11:08 AM      Component Value Range Status Comment   CT Probe RNA NEGATIVE  NEGATIVE Final    GC Probe RNA NEGATIVE  NEGATIVE Final      Procedures and Diagnostic Studies: Ct Abdomen Pelvis W Contrast  09/09/2012  *RADIOLOGY REPORT*  Clinical Data: 26 year old female left lower quadrant pain with vomiting.  CT ABDOMEN AND PELVIS WITH CONTRAST  Technique:  Multidetector CT imaging of the abdomen and pelvis was performed following the standard protocol during bolus administration of intravenous contrast.  Contrast: OMNIPAQUE IOHEXOL 300 MG/ML  SOLN  Comparison: Pelvic ultrasound 12/01/2009 and earlier.  Findings: Lung bases are clear.  No pericardial or pleural effusion. No osseous abnormality identified.  No pelvic free fluid.  IUD in place.  Uterus and adnexa otherwise normal.  The bladder is moderate to severely distended.  The rectum and distal sigmoid are within normal limits.   There is confluent inflammation in the mesentery surrounding the distal descending colon in the left lower quadrant, series 2 image 53, coronal image 57.  A segment of about 6 cm of colon is affected. There is evidence of diverticula in this region.  Mild diverticula in the proximal sigmoid which appears mostly spared.  Upstream left and transverse colon are decompressed.  Right colon and appendix are normal.  Terminal ileum within normal limits.  No dilated small bowel.  Some left abdominal small bowel loops might be mildly secondarily affected.  Stomach and duodenum largely decompressed.  Gallbladder surgically absent.  Mildly decreased density throughout the liver.  Spleen, pancreas and adrenal glands are within normal limits.  Portal venous system and major arterial structures in the abdomen and pelvis are patent.  No abdominal free fluid or free air.  Both kidneys are within normal limits.  IMPRESSION: 1.  6 cm segment of inflamed distal descending colon and mesentery most compatible with acute diverticulitis in this setting.  No free fluid or complicating features. 2.  Moderate to severely distended bladder.   Original Report Authenticated By: Erskine Speed, M.D.    Dg Abd Acute W/chest  09/09/2012  *RADIOLOGY REPORT*  Clinical Data: Abdominal pain  ACUTE ABDOMEN SERIES (ABDOMEN 2 VIEW & CHEST 1 VIEW)  Comparison: None.  Findings: Cardiomediastinal silhouette is unremarkable.  No acute infiltrate or pleural effusion.  No pulmonary edema.  There is nonspecific nonobstructive bowel gas pattern.  Post cholecystectomy surgical clips are noted.  IUD noted midline upper pelvis.  No free abdominal air  IMPRESSION: .  No active disease.  IUD midline pelvis.  Post cholecystectomy surgical clips.   Original Report Authenticated By: Natasha Mead, M.D.     Scheduled Meds:    . ciprofloxacin  400 mg Intravenous Once  . ciprofloxacin  400 mg Intravenous Q12H  . metronidazole  500 mg Intravenous Q8H   Continuous  Infusions:    . dextrose 5 % and 0.9 % NaCl with KCl 20 mEq/L 100 mL/hr at 09/10/12 0246    Time spent: 35 minutes.   LOS: 1 day   Roselina Burgueno  [facility] Pager [phone].  If 8PM-8AM, please contact night-coverage at [email or web address], password [facility] 09/10/2012, 7:32 AM

## 2012-09-11 LAB — URINE CULTURE

## 2012-09-11 MED ORDER — METRONIDAZOLE IN NACL 5-0.79 MG/ML-% IV SOLN
500.0000 mg | Freq: Three times a day (TID) | INTRAVENOUS | Status: DC
  Administered 2012-09-11 – 2012-09-12 (×3): 500 mg via INTRAVENOUS
  Filled 2012-09-11 (×4): qty 100

## 2012-09-11 MED ORDER — CIPROFLOXACIN HCL 500 MG PO TABS
500.0000 mg | ORAL_TABLET | Freq: Two times a day (BID) | ORAL | Status: DC
Start: 2012-09-11 — End: 2012-09-11
  Administered 2012-09-11: 500 mg via ORAL
  Filled 2012-09-11 (×3): qty 1

## 2012-09-11 MED ORDER — METRONIDAZOLE 500 MG PO TABS
500.0000 mg | ORAL_TABLET | Freq: Three times a day (TID) | ORAL | Status: DC
  Administered 2012-09-11: 500 mg via ORAL
  Filled 2012-09-11 (×4): qty 1

## 2012-09-11 MED ORDER — CIPROFLOXACIN IN D5W 400 MG/200ML IV SOLN
400.0000 mg | Freq: Two times a day (BID) | INTRAVENOUS | Status: DC
  Administered 2012-09-11 – 2012-09-12 (×2): 400 mg via INTRAVENOUS
  Filled 2012-09-11 (×2): qty 200

## 2012-09-11 MED ORDER — PROMETHAZINE HCL 25 MG PO TABS
12.5000 mg | ORAL_TABLET | Freq: Four times a day (QID) | ORAL | Status: DC | PRN
  Administered 2012-09-11 – 2012-09-12 (×2): 12.5 mg via ORAL
  Filled 2012-09-11 (×2): qty 1

## 2012-09-11 NOTE — Progress Notes (Signed)
[facility] PROGRESS NOTE  Lorraine [name] ZOX:[id number] DOB: 07/05/1986 DOA: 09/09/2012 PCP: No primary provider on file.  Brief narrative: Lorraine [name] is a 26 year old female with no significant past medical history who was admitted to the hospital on 09/09/2012 with acute diverticulitis. She has been placed on empiric Cipro and Flagyl.  Assessment/Plan: Principal Problem:  *Diverticulitis  Admitted and imaging studies showed acute diverticulitis.  On empiric Cipro and Flagyl. Switch to oral therapy, and if able to tolerate, can likely be discharged in the next 24 hours.  Case manager consulted to help with medication assistance. Active Problems:  Abdominal pain  Secondary to acute diverticulitis. Pain medication as needed.  Nausea and vomiting  Supportive care with IV fluids and antinausea medications.  Dehydration  Continue IV fluids.  IUD contraception   Code Status: Full Family Communication: Family updated at bedside. Disposition Plan: Home when stable.   Medical Consultants:  None.  Other Consultants:  None.  Anti-infectives:  Cipro 09/09/2012--->  Flagyl 09/09/2012--->  HPI/Subjective: Lorraine [name] is still having episodes of nausea requiring IV antinausea medications. No frank vomiting episodes. Still with some abdominal pain as well. No diarrhea or blood in the stools.  Objective: Filed Vitals:   09/10/12 0531 09/10/12 1347 09/10/12 2050 09/11/12 0530  BP: 91/41 103/61 116/80 106/81  Pulse: 103 83 93 86  Temp: 98.7 F (37.1 C) 98.3 F (36.8 C) 98.4 F (36.9 C) 98.4 F (36.9 C)  TempSrc: Oral Oral Oral Oral  Resp: 18 16 18 18   Height:      Weight:      SpO2: 97% 98% 98% 98%    Intake/Output Summary (Last 24 hours) at 09/11/12 1037 Last data filed at 09/11/12 0520  Gross per 24 hour  Intake    720 ml  Output      0 ml  Net    720 ml    Exam: Gen:  NAD Cardiovascular:  RRR, No M/R/G Respiratory:  Lungs  CTAB Gastrointestinal:  Abdomen soft, mildly tender left lower quadrant, + BS Extremities:  No C/E/C  Data Reviewed: Basic Metabolic Panel:  Lab 09/09/12 8119  NA 137  K 3.7  CL 103  CO2 26  GLUCOSE 98  BUN 13  CREATININE 0.73  CALCIUM 9.0  MG --  PHOS --   GFR Estimated Creatinine Clearance: 102.3 ml/min (by C-G formula based on Cr of 0.73). Liver Function Tests:  Lab 09/09/12 1038  AST 15  ALT 21  ALKPHOS 100  BILITOT 0.3  PROT 7.2  ALBUMIN 3.7    Lab 09/09/12 1038  LIPASE 19  AMYLASE --   CBC:  Lab 09/09/12 1038  WBC 10.8*  NEUTROABS 7.3  HGB 13.0  HCT 37.8  MCV 89.2  PLT 291   Microbiology Recent Results (from the past 240 hour(s))  URINE CULTURE     Status: Normal (Preliminary result)   Collection Time   09/09/12 10:22 AM      Component Value Range Status Comment   Specimen Description URINE, CLEAN CATCH   Final    Special Requests NONE   Final    Culture  Setup Time 09/09/2012 18:59   Final    Colony Count PENDING   Incomplete    Culture Culture reincubated for better growth   Final    Report Status PENDING   Incomplete   WET PREP, GENITAL     Status: Abnormal   Collection Time   09/09/12 11:08 AM      Component Value  Range Status Comment   Yeast Wet Prep HPF POC RARE (*) NONE SEEN Final    Trich, Wet Prep NONE SEEN  NONE SEEN Final    Clue Cells Wet Prep HPF POC RARE (*) NONE SEEN Final    WBC, Wet Prep HPF POC RARE (*) NONE SEEN Final   GC/CHLAMYDIA PROBE AMP     Status: Normal   Collection Time   09/09/12 11:08 AM      Component Value Range Status Comment   CT Probe RNA NEGATIVE  NEGATIVE Final    GC Probe RNA NEGATIVE  NEGATIVE Final      Procedures and Diagnostic Studies: Ct Abdomen Pelvis W Contrast  09/09/2012  *RADIOLOGY REPORT*  Clinical Data: 26 year old female left lower quadrant pain with vomiting.  CT ABDOMEN AND PELVIS WITH CONTRAST  Technique:  Multidetector CT imaging of the abdomen and pelvis was performed following  the standard protocol during bolus administration of intravenous contrast.  Contrast: OMNIPAQUE IOHEXOL 300 MG/ML  SOLN  Comparison: Pelvic ultrasound 12/01/2009 and earlier.  Findings: Lung bases are clear.  No pericardial or pleural effusion. No osseous abnormality identified.  No pelvic free fluid.  IUD in place.  Uterus and adnexa otherwise normal.  The bladder is moderate to severely distended.  The rectum and distal sigmoid are within normal limits.  There is confluent inflammation in the mesentery surrounding the distal descending colon in the left lower quadrant, series 2 image 53, coronal image 57.  A segment of about 6 cm of colon is affected. There is evidence of diverticula in this region.  Mild diverticula in the proximal sigmoid which appears mostly spared.  Upstream left and transverse colon are decompressed.  Right colon and appendix are normal.  Terminal ileum within normal limits.  No dilated small bowel.  Some left abdominal small bowel loops might be mildly secondarily affected.  Stomach and duodenum largely decompressed.  Gallbladder surgically absent.  Mildly decreased density throughout the liver.  Spleen, pancreas and adrenal glands are within normal limits.  Portal venous system and major arterial structures in the abdomen and pelvis are patent.  No abdominal free fluid or free air.  Both kidneys are within normal limits.  IMPRESSION: 1.  6 cm segment of inflamed distal descending colon and mesentery most compatible with acute diverticulitis in this setting.  No free fluid or complicating features. 2.  Moderate to severely distended bladder.   Original Report Authenticated By: Erskine Speed, M.D.    Dg Abd Acute W/chest  09/09/2012  *RADIOLOGY REPORT*  Clinical Data: Abdominal pain  ACUTE ABDOMEN SERIES (ABDOMEN 2 VIEW & CHEST 1 VIEW)  Comparison: None.  Findings: Cardiomediastinal silhouette is unremarkable.  No acute infiltrate or pleural effusion.  No pulmonary edema.  There is  nonspecific nonobstructive bowel gas pattern.  Post cholecystectomy surgical clips are noted.  IUD noted midline upper pelvis.  No free abdominal air  IMPRESSION: .  No active disease.  IUD midline pelvis.  Post cholecystectomy surgical clips.   Original Report Authenticated By: Natasha Mead, M.D.     Scheduled Meds:    . ciprofloxacin  500 mg Oral BID  . metroNIDAZOLE  500 mg Oral Q8H   Continuous Infusions:    . dextrose 5 % and 0.9 % NaCl with KCl 20 mEq/L 100 mL/hr (09/11/12 0547)    Time spent: 25 minutes.   LOS: 2 days   RAMA,CHRISTINA  [facility] Pager [phone].  If 8PM-8AM, please contact night-coverage at [email or web address],  password [facility] 09/11/2012, 10:37 AM

## 2012-09-11 NOTE — ED Provider Notes (Signed)
Medical screening examination/treatment/procedure(s) were performed by non-physician practitioner and as supervising physician I was immediately available for consultation/collaboration.   Mani Celestin B. Chevonne Bostrom, MD 09/11/12 0658 

## 2012-09-12 MED ORDER — ACETAMINOPHEN 325 MG PO TABS
650.0000 mg | ORAL_TABLET | ORAL | Status: DC | PRN
  Administered 2012-09-12 (×2): 650 mg via ORAL
  Filled 2012-09-12 (×2): qty 2

## 2012-09-12 MED ORDER — HYDROCODONE-ACETAMINOPHEN 5-325 MG PO TABS: 1.0000 | ORAL_TABLET | ORAL | Status: DC | PRN

## 2012-09-12 MED ORDER — METRONIDAZOLE 500 MG PO TABS: 500.0000 mg | ORAL_TABLET | Freq: Three times a day (TID) | ORAL | Status: DC

## 2012-09-12 MED ORDER — CIPROFLOXACIN HCL 500 MG PO TABS
500.0000 mg | ORAL_TABLET | Freq: Two times a day (BID) | ORAL | Status: DC
  Filled 2012-09-12 (×2): qty 1

## 2012-09-12 MED ORDER — PROMETHAZINE HCL 12.5 MG PO TABS: 12.5000 mg | ORAL_TABLET | Freq: Four times a day (QID) | ORAL | Status: DC | PRN

## 2012-09-12 MED ORDER — METRONIDAZOLE 500 MG PO TABS
500.0000 mg | ORAL_TABLET | Freq: Three times a day (TID) | ORAL | Status: DC
  Filled 2012-09-12 (×3): qty 1

## 2012-09-12 MED ORDER — OXYCODONE-ACETAMINOPHEN 5-325 MG PO TABS: 1.0000 | ORAL_TABLET | ORAL | Status: DC | PRN

## 2012-09-12 MED ORDER — CIPROFLOXACIN HCL 500 MG PO TABS: 500.0000 mg | ORAL_TABLET | Freq: Two times a day (BID) | ORAL | Status: DC

## 2012-09-12 MED ORDER — PROMETHAZINE HCL 25 MG RE SUPP: 25.0000 mg | Freq: Four times a day (QID) | RECTAL | Status: DC | PRN

## 2012-09-12 NOTE — Progress Notes (Signed)
Pt received discharge education from both I and Dr. Darnelle Catalan. Spanish translations were offered. Education was also given to pt's significant other. Pt verbalized understanding of teaching and did not have further questions post discharge education. Pt's health status had not changed since shift assessment. Pt was able to keep pills down without vomiting. Nausea treated with Phenergan.

## 2012-09-12 NOTE — Care Management Note (Signed)
    Page 1 of 1   09/12/2012     11:04:57 AM   CARE MANAGEMENT NOTE 09/12/2012  Patient:  Lorraine, [name]   Account Number:  [id number]  Date Initiated:  09/10/2012  Documentation initiated by:  PEARSON,COOKIE  Subjective/Objective Assessment:   pt admitted with cco abd pain, N, V     Action/Plan:   from home   Anticipated DC Date:  09/10/2012   Anticipated DC Plan:  HOME/SELF CARE  In-house referral  Financial Counselor  Interpreting Services      DC Planning Services  CM consult  MATCH Program      Choice offered to / List presented to:             Status of service:  Completed, signed off Medicare Important Message given?  NA - LOS <3 / Initial given by admissions (If response is "NO", the following Medicare IM given date fields will be blank) Date Medicare IM given:   Date Additional Medicare IM given:    Discharge Disposition:  HOME/SELF CARE  Per UR Regulation:  Reviewed for med. necessity/level of care/duration of stay  If discussed at Long Length of Stay Meetings, dates discussed:    Comments:  09-12-12 Lorraine Ishihara RN CM 1000 Spoke with patient at bedside with interpreter. Patient states unable to pay for meds. Discussed Match program with her and she agree to copay. Left Match letter with patient to assist with meds at d/c. She also expressed concern regarding cost of hospital stay. Provided her with contact information to billing office to discuss payment options.  09/10/12 MPearson, RN, BSN Chart reviewed.

## 2012-09-12 NOTE — Discharge Summary (Signed)
Physician Discharge Summary  Lorraine [name] GNF:[id number] DOB: [birth year] DOA: 09/09/2012  PCP: No primary provider on file.  Admit date: 09/09/2012 Discharge date: 09/12/2012  Recommendations for Outpatient Follow-up:  1. F/U at [facility] ED for non-resolution of symptoms.  Discharge Diagnoses:  Principal Problem:  *Diverticulitis Active Problems:   Abdominal pain   Nausea and vomiting   Dehydration   IUD contraception   Discharge Condition: Improved.  Diet recommendation: Regular.  History of present illness:  Ms. Borjon is a 26 year old female with no significant past medical history who was admitted to the hospital on 09/09/2012 with acute diverticulitis. She was placed on empiric Cipro and Flagyl.  Hospital Course by problem:  Principal Problem:  *Diverticulitis  Admitted and imaging studies showed acute diverticulitis.  On empiric Cipro and Flagyl. D/C on an additional 10 days of therapy.  Case manager consulted to help with medication assistance, instructed to get her prescriptions filled at the [facility] where her medications will be provided to her. Active Problems:  Abdominal pain  Secondary to acute diverticulitis. Pain medication as needed. Nausea and vomiting  Supportive care with IV fluids and antinausea medications. Dehydration  Hydrated. IUD contraception   Procedures:  None.  Consultations:  None.  Discharge Exam: Filed Vitals:   09/12/12 1432  BP: 91/49  Pulse: 74  Temp: 98.8 F (37.1 C)  Resp: 16   Filed Vitals:   09/11/12 1337 09/11/12 2108 09/12/12 0552 09/12/12 1432  BP: 117/69 107/67 92/57 91/49   Pulse: 87 95 59 74  Temp: 98.4 F (36.9 C) 98.6 F (37 C) 98 F (36.7 C) 98.8 F (37.1 C)  TempSrc: Oral Oral Oral   Resp: 18 18 18 16   Height:      Weight:      SpO2: 99% 99% 100% 99%    Gen:  NAD Cardiovascular:  RRR, No M/R/G Respiratory: Lungs CTAB Gastrointestinal: Abdomen soft, NT/ND with  normal active bowel sounds. Extremities: No C/E/C   Discharge Instructions  Discharge Orders    Future Orders Please Complete By Expires   Diet general      Increase activity slowly      Call MD for:  temperature >100.4      Call MD for:  persistant nausea and vomiting      Call MD for:  severe uncontrolled pain          Medication List     As of 09/12/2012  2:33 PM    TAKE these medications         ciprofloxacin 500 MG tablet   Commonly known as: CIPRO   Take 1 tablet (500 mg total) by mouth 2 (two) times daily. [employer] veces al dia por 10 dias.      HYDROcodone-acetaminophen 5-325 MG per tablet   Commonly known as: NORCO/VICODIN   Take 1-2 tablets by mouth every 4 (four) hours as needed for pain. Toma cuando neccesarios cada 4 horas por dolor.      metroNIDAZOLE 500 MG tablet   Commonly known as: FLAGYL   Take 1 tablet (500 mg total) by mouth 3 (three) times daily. Una pastilla cada 8 horas por 10 dias.  No toma alcohol con esta medicamento.      promethazine 12.5 MG tablet   Commonly known as: PHENERGAN   Take 1 tablet (12.5 mg total) by mouth every 6 (six) hours as needed. Una pastilla cada 6 horas cuando neccesitas por nausea  Follow-up Information    Follow up with [facility].   Contact information:   [address] [id number] mc [place] [address] [phone]          The results of significant diagnostics from this hospitalization (including imaging, microbiology, ancillary and laboratory) are listed below for reference.    Significant Diagnostic Studies: Ct Abdomen Pelvis W Contrast  09/09/2012  *RADIOLOGY REPORT*  Clinical Data: 26 year old female left lower quadrant pain with vomiting.  CT ABDOMEN AND PELVIS WITH CONTRAST  Technique:  Multidetector CT imaging of the abdomen and pelvis was performed following the standard protocol during bolus administration of intravenous  contrast.  Contrast: OMNIPAQUE IOHEXOL 300 MG/ML  SOLN  Comparison: Pelvic ultrasound 12/01/2009 and earlier.  Findings: Lung bases are clear.  No pericardial or pleural effusion. No osseous abnormality identified.  No pelvic free fluid.  IUD in place.  Uterus and adnexa otherwise normal.  The bladder is moderate to severely distended.  The rectum and distal sigmoid are within normal limits.  There is confluent inflammation in the mesentery surrounding the distal descending colon in the left lower quadrant, series 2 image 53, coronal image 57.  A segment of about 6 cm of colon is affected. There is evidence of diverticula in this region.  Mild diverticula in the proximal sigmoid which appears mostly spared.  Upstream left and transverse colon are decompressed.  Right colon and appendix are normal.  Terminal ileum within normal limits.  No dilated small bowel.  Some left abdominal small bowel loops might be mildly secondarily affected.  Stomach and duodenum largely decompressed.  Gallbladder surgically absent.  Mildly decreased density throughout the liver.  Spleen, pancreas and adrenal glands are within normal limits.  Portal venous system and major arterial structures in the abdomen and pelvis are patent.  No abdominal free fluid or free air.  Both kidneys are within normal limits.  IMPRESSION: 1.  6 cm segment of inflamed distal descending colon and mesentery most compatible with acute diverticulitis in this setting.  No free fluid or complicating features. 2.  Moderate to severely distended bladder.   Original Report Authenticated By: Erskine Speed, M.D.    Dg Abd Acute W/chest  09/09/2012  *RADIOLOGY REPORT*  Clinical Data: Abdominal pain  ACUTE ABDOMEN SERIES (ABDOMEN 2 VIEW & CHEST 1 VIEW)  Comparison: None.  Findings: Cardiomediastinal silhouette is unremarkable.  No acute infiltrate or pleural effusion.  No pulmonary edema.  There is nonspecific nonobstructive bowel gas pattern.  Post cholecystectomy  surgical clips are noted.  IUD noted midline upper pelvis.  No free abdominal air  IMPRESSION: .  No active disease.  IUD midline pelvis.  Post cholecystectomy surgical clips.   Original Report Authenticated By: Natasha Mead, M.D.     Microbiology: Recent Results (from the past 240 hour(s))  URINE CULTURE     Status: Normal   Collection Time   09/09/12 10:22 AM      Component Value Range Status Comment   Specimen Description URINE, CLEAN CATCH   Final    Special Requests NONE   Final    Culture  Setup Time 09/09/2012 18:59   Final    Colony Count 20,OOO COLONIES/ML   Final    Culture     Final    Value: Multiple bacterial morphotypes present, none predominant. Suggest appropriate recollection if clinically indicated.   Report Status 09/11/2012 FINAL   Final   WET PREP, GENITAL  Status: Abnormal   Collection Time   09/09/12 11:08 AM      Component Value Range Status Comment   Yeast Wet Prep HPF POC RARE (*) NONE SEEN Final    Trich, Wet Prep NONE SEEN  NONE SEEN Final    Clue Cells Wet Prep HPF POC RARE (*) NONE SEEN Final    WBC, Wet Prep HPF POC RARE (*) NONE SEEN Final   GC/CHLAMYDIA PROBE AMP     Status: Normal   Collection Time   09/09/12 11:08 AM      Component Value Range Status Comment   CT Probe RNA NEGATIVE  NEGATIVE Final    GC Probe RNA NEGATIVE  NEGATIVE Final      Labs: Basic Metabolic Panel:  Lab 09/09/12 1610  NA 137  K 3.7  CL 103  CO2 26  GLUCOSE 98  BUN 13  CREATININE 0.73  CALCIUM 9.0  MG --  PHOS --   Liver Function Tests:  Lab 09/09/12 1038  AST 15  ALT 21  ALKPHOS 100  BILITOT 0.3  PROT 7.2  ALBUMIN 3.7    Lab 09/09/12 1038  LIPASE 19  AMYLASE --   CBC:  Lab 09/09/12 1038  WBC 10.8*  NEUTROABS 7.3  HGB 13.0  HCT 37.8  MCV 89.2  PLT 291   Time coordinating discharge: 35 minutes.  Signed:  Nasia Cannan  Pager [phone] [facility] 09/12/2012, 2:33 PM

## 2012-12-13 DIAGNOSIS — M545 Low back pain, unspecified: Secondary | ICD-10-CM | POA: Insufficient documentation

## 2012-12-13 DIAGNOSIS — Z3202 Encounter for pregnancy test, result negative: Secondary | ICD-10-CM | POA: Insufficient documentation

## 2012-12-13 DIAGNOSIS — R509 Fever, unspecified: Secondary | ICD-10-CM | POA: Insufficient documentation

## 2012-12-13 DIAGNOSIS — R1031 Right lower quadrant pain: Secondary | ICD-10-CM | POA: Insufficient documentation

## 2012-12-13 DIAGNOSIS — R350 Frequency of micturition: Secondary | ICD-10-CM | POA: Insufficient documentation

## 2012-12-13 DIAGNOSIS — R110 Nausea: Secondary | ICD-10-CM | POA: Insufficient documentation

## 2012-12-13 DIAGNOSIS — R42 Dizziness and giddiness: Secondary | ICD-10-CM | POA: Insufficient documentation

## 2012-12-14 ENCOUNTER — Emergency Department (HOSPITAL_COMMUNITY): Payer: Self-pay

## 2012-12-14 ENCOUNTER — Emergency Department (HOSPITAL_COMMUNITY)
Admission: EM | Admit: 2012-12-14 | Discharge: 2012-12-14 | Disposition: A | Discharge status: Home / Self Care | Payer: Self-pay | Attending: Emergency Medicine | Admitting: Emergency Medicine

## 2012-12-14 ENCOUNTER — Encounter (HOSPITAL_COMMUNITY): Payer: Self-pay | Admitting: *Deleted

## 2012-12-14 DIAGNOSIS — R1111 Vomiting without nausea: Secondary | ICD-10-CM

## 2012-12-14 DIAGNOSIS — R1031 Right lower quadrant pain: Secondary | ICD-10-CM

## 2012-12-14 DIAGNOSIS — M545 Low back pain: Secondary | ICD-10-CM

## 2012-12-14 LAB — URINALYSIS, ROUTINE W REFLEX MICROSCOPIC
Bilirubin Urine: NEGATIVE
Specific Gravity, Urine: >1.046 — ABNORMAL HIGH (ref 1.005–1.030)
Urobilinogen, UA: 0.2 mg/dL (ref 0.0–1.0)
pH: 6 (ref 5.0–8.0)

## 2012-12-14 LAB — COMPREHENSIVE METABOLIC PANEL
Alkaline Phosphatase: 105 U/L (ref 39–117)
BUN: 15 mg/dL (ref 6–23)
Chloride: 104 mEq/L (ref 96–112)
GFR calc Af Amer: >90 mL/min (ref >90)
Glucose, Bld: 84 mg/dL (ref 70–99)
Potassium: 4.1 mEq/L (ref 3.5–5.1)
Total Bilirubin: 0.2 mg/dL — ABNORMAL LOW (ref 0.3–1.2)

## 2012-12-14 LAB — POCT PREGNANCY, URINE: Preg Test, Ur: NEGATIVE

## 2012-12-14 LAB — CBC WITH DIFFERENTIAL/PLATELET
Basophils Absolute: 0 10*3/uL (ref 0.0–0.1)
Lymphocytes Relative: 41 % (ref 12–46)
Lymphs Abs: 1.4 10*3/uL (ref 0.7–4.0)
Monocytes Relative: 5 % (ref 3–12)
Platelets: ADEQUATE 10*3/uL (ref 150–400)
RDW: 12.2 % (ref 11.5–15.5)
WBC Morphology: INCREASED
WBC: 3.4 10*3/uL — ABNORMAL LOW (ref 4.0–10.5)

## 2012-12-14 LAB — URINE MICROSCOPIC-ADD ON

## 2012-12-14 MED ORDER — HYDROCODONE-ACETAMINOPHEN 5-325 MG PO TABS: 1.0000 | ORAL_TABLET | Freq: Four times a day (QID) | ORAL | Status: DC | PRN

## 2012-12-14 MED ORDER — IBUPROFEN 600 MG PO TABS: 600.0000 mg | ORAL_TABLET | Freq: Four times a day (QID) | ORAL | Status: DC | PRN

## 2012-12-14 MED ORDER — IOHEXOL 300 MG/ML  SOLN
100.0000 mL | Freq: Once | INTRAMUSCULAR | Status: AC | PRN
  Administered 2012-12-14: 100 mL via INTRAVENOUS

## 2012-12-14 MED ORDER — HYDROMORPHONE HCL PF 1 MG/ML IJ SOLN
0.5000 mg | Freq: Once | INTRAMUSCULAR | Status: AC
  Administered 2012-12-14: 0.5 mg via INTRAVENOUS
  Filled 2012-12-14: qty 1

## 2012-12-14 MED ORDER — IOHEXOL 300 MG/ML  SOLN
50.0000 mL | Freq: Once | INTRAMUSCULAR | Status: AC | PRN
  Administered 2012-12-14: 50 mL via ORAL

## 2012-12-14 MED ORDER — SODIUM CHLORIDE 0.9 % IV BOLUS (SEPSIS)
1000.0000 mL | Freq: Once | INTRAVENOUS | Status: AC
  Administered 2012-12-14: 1000 mL via INTRAVENOUS

## 2012-12-14 MED ORDER — ONDANSETRON HCL 4 MG/2ML IJ SOLN
4.0000 mg | Freq: Once | INTRAMUSCULAR | Status: AC
  Administered 2012-12-14: 4 mg via INTRAVENOUS
  Filled 2012-12-14: qty 2

## 2012-12-14 NOTE — ED Notes (Signed)
Pt complains of back pain for 3 years but since yesterday the pain has increased and is unbearable. When she is lying or sitting the pain is not present however standing and walking makes the pain worses. She also states that the pain is worsen when she tilts her head forward. She does complain of dizziness at times with the pain. Pt notes that pain was not present before her second Caesarean. [place], [employer]

## 2012-12-14 NOTE — ED Notes (Signed)
Discharge instructions reviewed with pt via the language line for interpretation. MD at bedside to discuss medications and follow up information with pt at this time.

## 2012-12-14 NOTE — ED Notes (Signed)
Per pt. She c/o LBP. States this is chronic pain that is intermittent. Pain onset 5 years ago after having child. States every since she has had pain in her lower back. Pt states pain worsened today, recurrent onset on yesterday

## 2012-12-14 NOTE — ED Provider Notes (Signed)
History     CSN: [id number]  Arrival date & time 12/13/12  2353   First MD Initiated Contact with Patient 12/14/12 0119      Chief Complaint  Patient presents with  . Back Pain   History obtained using translator telephones are (Spanish)  HPI Lorraine [name] is a 27 y.o. female complaining of low back pain and right lower quadrant pain. Patient says she's had off and on back pain for the last 3 years but over the last couple days it's worsened, and is currently severe, 10 out of 10 pain, she says the pain is worse on standing and walking, motion makes the pain worse. Laying down and sitting help alleviate the pain. She's had some dizziness with the pain and says when she tilts her head forward that worsens the pain as well.  Patient's also had some nausea denies vomiting, diarrhea. No vaginal discharge, no dysuria, no fevers, chills, chest pain or shortness of breath. She does complain of frequency.   History reviewed. No pertinent past medical history.  Past Surgical History  Procedure Laterality Date  . Cesarean section      History reviewed. No pertinent family history.  History  Substance Use Topics  . Smoking status: Never Smoker   . Smokeless tobacco: Never Used  . Alcohol Use: No    OB History   Grav Para Term Preterm Abortions TAB SAB Ect Mult Living                  Review of Systems  Constitutional: Positive for fever and chills.       Yesterday   HENT: Negative for congestion, sore throat, rhinorrhea and neck pain.   Eyes: Negative for visual disturbance.  Respiratory: Negative for cough, chest tightness and shortness of breath.   Cardiovascular: Negative for chest pain and palpitations.  Gastrointestinal: Positive for nausea and abdominal pain. Negative for vomiting and diarrhea.  Endocrine: Negative for polydipsia and polyuria.  Genitourinary: Positive for frequency. Negative for dysuria, vaginal bleeding and vaginal discharge.  Musculoskeletal: Positive for  back pain.  Skin: Negative for pallor and rash.  Neurological: Negative for headaches.    Allergies  Review of patient's allergies indicates no known allergies.  Home Medications  No current outpatient prescriptions on file.  BP 120/81  Pulse 81  Temp(Src) 98.2 F (36.8 C) (Oral)  Resp 20  SpO2 99%  Physical Exam  Nursing notes reviewed.  Electronic medical record reviewed. VITAL SIGNS:   Filed Vitals:   12/14/12 0430 12/14/12 0735 12/14/12 0743 12/14/12 0925  BP: 117/61 115/65 115/56 106/63  Pulse: 82 92 79 67  Temp:   97.9 F (36.6 C)   TempSrc:   Oral   Resp: 12  16 16   SpO2: 98% 99% 100% 99%   CONSTITUTIONAL: Awake, oriented, appears non-toxic HENT: Atraumatic, normocephalic, oral mucosa pink and moist, airway patent. Nares patent without drainage. External ears normal. EYES: Conjunctiva clear, EOMI, PERRLA NECK: Trachea midline, non-tender, supple CARDIOVASCULAR: Normal heart rate, Normal rhythm, No murmurs, rubs, gallops PULMONARY/CHEST: Clear to auscultation, no rhonchi, wheezes, or rales. Symmetrical breath sounds. Non-tender. ABDOMINAL: Non-distended, soft, tender in the right lower cord and without rebound or guarding.  BS normal. BACK: No step-offs or deformities, nontender to palpation in the midline, tender to palpation in the paraspinous muscles bilaterally, no skin abnormalities. NEUROLOGIC: Non-focal, moving all four extremities, no gross sensory or motor deficits. EXTREMITIES: No clubbing, cyanosis, or edema SKIN: Warm, Dry, No erythema, No rash  ED Course  Procedures (including critical care time)  Labs Reviewed  URINALYSIS, ROUTINE W REFLEX MICROSCOPIC - Abnormal; Notable for the following:    Specific Gravity, Urine >1.046 (*)    Hgb urine dipstick LARGE (*)    Nitrite POSITIVE (*)    All other components within normal limits  COMPREHENSIVE METABOLIC PANEL - Abnormal; Notable for the following:    Total Bilirubin 0.2 (*)    All other  components within normal limits  CBC WITH DIFFERENTIAL - Abnormal; Notable for the following:    WBC 3.4 (*)    All other components within normal limits  URINE MICROSCOPIC-ADD ON - Abnormal; Notable for the following:    Squamous Epithelial / LPF MANY (*)    Bacteria, UA MANY (*)    All other components within normal limits  URINE CULTURE  LIPASE, BLOOD  POCT PREGNANCY, URINE   Ct Lumbar Spine Wo Contrast  12/14/2012  *RADIOLOGY REPORT*  Clinical Data: Back pain.  CT LUMBAR SPINE WITHOUT CONTRAST  Technique:  Multidetector CT imaging of the lumbar spine was performed without intravenous contrast administration. Multiplanar CT image reconstructions were also generated.  Comparison: No priors.  Findings: No acute displaced fractures or compression type fractures are noted in the lumbar spine.  Alignment is anatomic. No defects of the pars interarticularis are noted. There are no aggressive appearing lytic or blastic lesions noted in the visualized portions of the skeleton.  IMPRESSION: 1.  No acute abnormality of the lumbar spine to account for the patient's symptoms.   Original Report Authenticated By: Trudie Reed, M.D.    Ct Abdomen Pelvis W Contrast  12/14/2012  *RADIOLOGY REPORT*  Clinical Data: Right lower quadrant pain.  Evaluate for possible appendicitis.  CT ABDOMEN AND PELVIS WITH CONTRAST  Technique:  Multidetector CT imaging of the abdomen and pelvis was performed following the standard protocol during bolus administration of intravenous contrast.  Contrast: 50mL OMNIPAQUE IOHEXOL 300 MG/ML  SOLN, OMNIPAQUE IOHEXOL 300 MG/ML  SOLN  Comparison: No priors.  Findings:  Lung Bases: Minimal subsegmental atelectasis in the lower lobes of the lungs bilaterally.  Abdomen/Pelvis:  Status post cholecystectomy.  The appearance of the liver, pancreas, spleen, bilateral adrenal glands and bilateral kidneys is unremarkable.  Normal appendix.  No significant volume of ascites.  No  pneumoperitoneum.  No pathologic distension of small bowel.  There are a few colonic diverticula, without surrounding inflammatory changes to suggest acute diverticulitis at this time.  Tiny umbilical hernia containing only a small amount of omental fat incidentally noted.  Ovoid shaped 4.7 x 2.2 cm low attenuation lesion in the left adnexa likely represents an ovarian cyst.  Right ovary is unremarkable in appearance.  An IUD is present within the endometrial canal.  Urinary bladder is unremarkable in appearance.  Musculoskeletal: There are no aggressive appearing lytic or blastic lesions noted in the visualized portions of the skeleton.  IMPRESSION: 1.  Normal appendix. 2.  No acute findings to account the patient's history of right lower quadrant abdominal pain. 3.  4.7 x 2.2 cm left ovarian cyst. 4.  Mild colonic diverticulosis without evidence to suggest acute diverticulitis. 5.  Status post cholecystectomy. 6.  Additional incidental findings, as above.   Original Report Authenticated By: Trudie Reed, M.D.      1. Lumbar back pain   2. RLQ abdominal pain   3. Vomiting without nausea       MDM  Patient presents with low back pain is similar to prior  low back pain is worse, however she's also got right lower quadrant pain. Patient is status post cholecystectomy. CMP is unremarkable, patient does not have an elevated white count - in fact is slightly low at 3.4 do not think this is significant. Urinalysis is positive for hemoglobin showing 0-2 red blood cells, no leukocyte esterase, 3-6 white blood cells however many squamous epithelial cells I think this is a contaminated specimen and not indicative of a urinary tract infection. CT of the abdomen and pelvis with recons of the lumbar spine obtained, concern for back pathology as well as intra-abdominal pathology with back and right lower quadrant pain. Hasn't her radiology, normal appendix is seen, no acute findings were found to account for the  patient's history of right lower cord or pain. Patient does have a left ovarian cyst and mild colonic diverticulosis without any evidence of inflammation.   His pain is well-controlled she is feeling much better, she will be discharged home stable and good condition with pain medicine and nausea medicine. Patient to followup with primary care physician. Return to the ER for any concerning new symptoms or worsening current symptoms not controlled with medications. Discharge instructions were given explicit detail using a translator phone.        Jones Skene, MD 12/15/12 1610

## 2012-12-16 LAB — URINE CULTURE: Colony Count: >=100000

## 2012-12-17 ENCOUNTER — Telehealth (HOSPITAL_COMMUNITY): Payer: Self-pay | Admitting: Emergency Medicine

## 2012-12-17 NOTE — ED Notes (Signed)
Patient has +Urine culture. Chart sent to EDP office for review. °

## 2012-12-17 NOTE — ED Notes (Signed)
+   Urine Chart sent to EDP office for review. 

## 2012-12-20 ENCOUNTER — Telehealth (HOSPITAL_COMMUNITY): Payer: Self-pay | Admitting: Emergency Medicine

## 2013-01-10 ENCOUNTER — Emergency Department (HOSPITAL_COMMUNITY)
Admission: EM | Admit: 2013-01-10 | Discharge: 2013-01-10 | Disposition: A | Discharge status: Home / Self Care | Payer: Self-pay | Attending: Emergency Medicine | Admitting: Emergency Medicine

## 2013-01-10 ENCOUNTER — Encounter (HOSPITAL_COMMUNITY): Payer: Self-pay | Admitting: Physical Medicine and Rehabilitation

## 2013-01-10 ENCOUNTER — Emergency Department (HOSPITAL_COMMUNITY): Payer: Self-pay

## 2013-01-10 DIAGNOSIS — J3489 Other specified disorders of nose and nasal sinuses: Secondary | ICD-10-CM | POA: Insufficient documentation

## 2013-01-10 DIAGNOSIS — J029 Acute pharyngitis, unspecified: Secondary | ICD-10-CM | POA: Insufficient documentation

## 2013-01-10 DIAGNOSIS — R51 Headache: Secondary | ICD-10-CM | POA: Insufficient documentation

## 2013-01-10 DIAGNOSIS — R110 Nausea: Secondary | ICD-10-CM | POA: Insufficient documentation

## 2013-01-10 DIAGNOSIS — R6883 Chills (without fever): Secondary | ICD-10-CM | POA: Insufficient documentation

## 2013-01-10 DIAGNOSIS — J069 Acute upper respiratory infection, unspecified: Secondary | ICD-10-CM | POA: Insufficient documentation

## 2013-01-10 LAB — RAPID STREP SCREEN (MED CTR MEBANE ONLY): Streptococcus, Group A Screen (Direct): NEGATIVE

## 2013-01-10 MED ORDER — HYDROCOD POLST-CHLORPHEN POLST 10-8 MG/5ML PO LQCR
5.0000 mL | Freq: Once | ORAL | Status: AC
  Administered 2013-01-10: 5 mL via ORAL
  Filled 2013-01-10: qty 5

## 2013-01-10 MED ORDER — HYDROCODONE-ACETAMINOPHEN 7.5-325 MG/15ML PO SOLN: 10.0000 mL | Freq: Four times a day (QID) | ORAL | Status: DC | PRN

## 2013-01-10 NOTE — ED Provider Notes (Signed)
History     CSN: [id number]  Arrival date & time 01/10/13  1045   First MD Initiated Contact with Patient 01/10/13 1129      Chief Complaint  Patient presents with  . Cough  . Nasal Congestion    (Consider location/radiation/quality/duration/timing/severity/associated sxs/prior treatment) Patient is a 27 y.o. female presenting with cough. The history is provided by the patient.  Cough Cough characteristics:  Productive Severity:  Moderate Associated symptoms: headaches and sore throat   Associated symptoms: no chills, no fever and no shortness of breath   Associated symptoms comment:  Cough, chills, sore throat, congestion x 5 days. No sick contacts.   No past medical history on file.  Past Surgical History  Procedure Laterality Date  . Cesarean section      No family history on file.  History  Substance Use Topics  . Smoking status: Never Smoker   . Smokeless tobacco: Never Used  . Alcohol Use: No    OB History   Grav Para Term Preterm Abortions TAB SAB Ect Mult Living                  Review of Systems  Constitutional: Negative for fever and chills.  HENT: Positive for congestion, sore throat and sinus pressure. Negative for trouble swallowing.   Respiratory: Positive for cough. Negative for shortness of breath.   Cardiovascular: Negative.        Right sided chest pain with movement or cough.  Gastrointestinal: Positive for nausea. Negative for vomiting and abdominal pain.  Musculoskeletal: Negative.   Skin: Negative.   Neurological: Positive for headaches.    Allergies  Review of patient's allergies indicates no known allergies.  Home Medications  No current outpatient prescriptions on file.  BP 118/62  Pulse 89  Temp(Src) 98.3 F (36.8 C) (Oral)  SpO2 97%  Physical Exam  Constitutional: She is oriented to person, place, and time. She appears well-developed and well-nourished.  HENT:  Head: Normocephalic.  Mouth/Throat: Uvula is midline.  Mucous membranes are not dry. No edematous. Posterior oropharyngeal erythema present. No oropharyngeal exudate.  Eyes: Conjunctivae are normal. Pupils are equal, round, and reactive to light.  Neck: Normal range of motion. Neck supple.  Cardiovascular: Normal rate and regular rhythm.   Pulmonary/Chest: Effort normal and breath sounds normal. She has no wheezes. She has no rales. She exhibits tenderness.  Right lower chest wall tenderness to palpation.  Abdominal: Soft. Bowel sounds are normal. There is no tenderness. There is no rebound and no guarding.  Musculoskeletal: Normal range of motion.  Neurological: She is alert and oriented to person, place, and time. No cranial nerve deficit.  Skin: Skin is warm and dry. No rash noted.  Psychiatric: She has a normal mood and affect.    ED Course  Procedures (including critical care time)  Labs Reviewed  RAPID STREP SCREEN   Dg Chest 2 View  01/10/2013  *RADIOLOGY REPORT*  Clinical Data: Cough and shortness of breath  CHEST - 2 VIEW  Comparison: None  Findings: The heart size and mediastinal contours are within normal limits.  Both lungs are clear.  The visualized skeletal structures are unremarkable.  IMPRESSION: Negative exam.   Original Report Authenticated By: Signa Kell, M.D.      No diagnosis found. 1. URI   MDM  CXR, strep negative. Suspect viral process - recommend supportive care.        Arnoldo Hooker, PA-C 01/10/13 1312

## 2013-01-10 NOTE — ED Notes (Signed)
Pt presents to department for evaluation of cough, chest congestion, runny nose and fever. Ongoing x5 days. Respirations unlabored. Pt is alert and oriented x4. No signs of acute distress noted.

## 2013-01-10 NOTE — ED Provider Notes (Signed)
Medical screening examination/treatment/procedure(s) were performed by non-physician practitioner and as supervising physician I was immediately available for consultation/collaboration.   Feiga Nadel B. Bernette Mayers, MD 01/10/13 1544

## 2013-01-19 ENCOUNTER — Encounter (HOSPITAL_COMMUNITY): Payer: Self-pay | Admitting: *Deleted

## 2013-02-06 ENCOUNTER — Emergency Department (HOSPITAL_COMMUNITY): Payer: Self-pay

## 2013-02-06 ENCOUNTER — Emergency Department (HOSPITAL_COMMUNITY)
Admission: EM | Admit: 2013-02-06 | Discharge: 2013-02-06 | Disposition: A | Discharge status: Home / Self Care | Payer: MEDICAID | Attending: Emergency Medicine | Admitting: Emergency Medicine

## 2013-02-06 ENCOUNTER — Ambulatory Visit (HOSPITAL_COMMUNITY): Payer: Self-pay

## 2013-02-06 ENCOUNTER — Encounter (HOSPITAL_COMMUNITY): Payer: Self-pay | Admitting: Emergency Medicine

## 2013-02-06 DIAGNOSIS — Z3202 Encounter for pregnancy test, result negative: Secondary | ICD-10-CM | POA: Insufficient documentation

## 2013-02-06 DIAGNOSIS — R51 Headache: Secondary | ICD-10-CM | POA: Insufficient documentation

## 2013-02-06 DIAGNOSIS — Z9089 Acquired absence of other organs: Secondary | ICD-10-CM | POA: Insufficient documentation

## 2013-02-06 DIAGNOSIS — R112 Nausea with vomiting, unspecified: Secondary | ICD-10-CM | POA: Insufficient documentation

## 2013-02-06 DIAGNOSIS — R509 Fever, unspecified: Secondary | ICD-10-CM | POA: Insufficient documentation

## 2013-02-06 DIAGNOSIS — R1011 Right upper quadrant pain: Secondary | ICD-10-CM | POA: Insufficient documentation

## 2013-02-06 DIAGNOSIS — R0602 Shortness of breath: Secondary | ICD-10-CM | POA: Insufficient documentation

## 2013-02-06 LAB — COMPREHENSIVE METABOLIC PANEL
ALT: 28 U/L (ref 0–35)
CO2: 26 mEq/L (ref 19–32)
Calcium: 9.1 mg/dL (ref 8.4–10.5)
GFR calc Af Amer: >90 mL/min (ref >90)
GFR calc non Af Amer: >90 mL/min (ref >90)
Glucose, Bld: 106 mg/dL — ABNORMAL HIGH (ref 70–99)
Sodium: 138 mEq/L (ref 135–145)
Total Bilirubin: 0.3 mg/dL (ref 0.3–1.2)

## 2013-02-06 LAB — CBC WITH DIFFERENTIAL/PLATELET
Basophils Relative: 1 % (ref 0–1)
Eosinophils Absolute: 0.1 10*3/uL (ref 0.0–0.7)
HCT: 39.9 % (ref 36.0–46.0)
Hemoglobin: 13.9 g/dL (ref 12.0–15.0)
MCH: 31.2 pg (ref 26.0–34.0)
MCHC: 34.8 g/dL (ref 30.0–36.0)
MCV: 89.5 fL (ref 78.0–100.0)
Monocytes Absolute: 0.4 10*3/uL (ref 0.1–1.0)
Monocytes Relative: 5 % (ref 3–12)

## 2013-02-06 LAB — URINALYSIS, ROUTINE W REFLEX MICROSCOPIC
Bilirubin Urine: NEGATIVE
Protein, ur: NEGATIVE mg/dL
Specific Gravity, Urine: 1.029 (ref 1.005–1.030)
Urobilinogen, UA: 0.2 mg/dL (ref 0.0–1.0)

## 2013-02-06 LAB — URINE MICROSCOPIC-ADD ON

## 2013-02-06 MED ORDER — IOHEXOL 300 MG/ML  SOLN
50.0000 mL | Freq: Once | INTRAMUSCULAR | Status: AC | PRN
  Administered 2013-02-06: 50 mL via ORAL

## 2013-02-06 MED ORDER — IOHEXOL 300 MG/ML  SOLN
100.0000 mL | Freq: Once | INTRAMUSCULAR | Status: AC | PRN
  Administered 2013-02-06: 100 mL via INTRAVENOUS

## 2013-02-06 MED ORDER — HYDROMORPHONE HCL PF 1 MG/ML IJ SOLN
0.5000 mg | Freq: Once | INTRAMUSCULAR | Status: AC
  Administered 2013-02-06: 0.5 mg via INTRAVENOUS

## 2013-02-06 MED ORDER — ONDANSETRON HCL 4 MG/2ML IJ SOLN
4.0000 mg | Freq: Once | INTRAMUSCULAR | Status: AC
  Administered 2013-02-06: 4 mg via INTRAVENOUS
  Filled 2013-02-06: qty 2

## 2013-02-06 MED ORDER — GI COCKTAIL ~~LOC~~
30.0000 mL | Freq: Once | ORAL | Status: AC
  Administered 2013-02-06: 30 mL via ORAL
  Filled 2013-02-06: qty 30

## 2013-02-06 MED ORDER — OXYCODONE-ACETAMINOPHEN 5-325 MG PO TABS: 2.0000 | ORAL_TABLET | ORAL | Status: DC | PRN

## 2013-02-06 MED ORDER — MORPHINE SULFATE 4 MG/ML IJ SOLN
4.0000 mg | Freq: Once | INTRAMUSCULAR | Status: AC
  Administered 2013-02-06: 4 mg via INTRAVENOUS
  Filled 2013-02-06: qty 1

## 2013-02-06 MED ORDER — PROMETHAZINE HCL 25 MG PO TABS: 25.0000 mg | ORAL_TABLET | Freq: Four times a day (QID) | ORAL | Status: DC | PRN

## 2013-02-06 MED ORDER — SODIUM CHLORIDE 0.9 % IV SOLN
Freq: Once | INTRAVENOUS | Status: AC
  Administered 2013-02-06: 11:00:00 via INTRAVENOUS

## 2013-02-06 MED ORDER — ONDANSETRON 8 MG PO TBDP
8.0000 mg | ORAL_TABLET | Freq: Once | ORAL | Status: DC
  Filled 2013-02-06: qty 1

## 2013-02-06 MED ORDER — HYDROMORPHONE HCL PF 1 MG/ML IJ SOLN
INTRAMUSCULAR | Status: AC
  Administered 2013-02-06: 0.5 mg via INTRAVENOUS
  Filled 2013-02-06: qty 1

## 2013-02-06 MED ORDER — OMEPRAZOLE 20 MG PO CPDR: 20.0000 mg | DELAYED_RELEASE_CAPSULE | Freq: Every day | ORAL | Status: DC

## 2013-02-06 NOTE — ED Notes (Signed)
Pt threw up as she was leaving the room.  Spoke with PA.  Got zofran for pt, but pt already left.

## 2013-02-06 NOTE — ED Notes (Signed)
Patient transported to CT 

## 2013-02-06 NOTE — ED Notes (Signed)
PA speaking with pt. 

## 2013-02-06 NOTE — ED Provider Notes (Signed)
Medical screening examination/treatment/procedure(s) were performed by non-physician practitioner and as supervising physician I was immediately available for consultation/collaboration.   Trevonne Nyland, MD 02/06/13 1555 

## 2013-02-06 NOTE — ED Provider Notes (Signed)
Medical screening examination/treatment/procedure(s) were performed by non-physician practitioner and as supervising physician I was immediately available for consultation/collaboration.   Deetta Siegmann, MD 02/06/13 1555 

## 2013-02-06 NOTE — ED Provider Notes (Signed)
History     CSN: [id number]  Arrival date & time 02/06/13  0944   First MD Initiated Contact with Patient 02/06/13 1016      Chief Complaint  Patient presents with  . Abdominal Pain  . Headache  . Emesis    (Consider location/radiation/quality/duration/timing/severity/associated sxs/prior treatment) Patient is a 27 y.o. female presenting with abdominal pain. The history is provided by the patient. A language interpreter was used.  Abdominal Pain This is a chronic problem. The current episode started more than 1 month ago. The problem occurs daily. Associated symptoms include abdominal pain, a fever, nausea and vomiting. Pertinent negatives include no chest pain or chills. Associated symptoms comments: She complains of RUQ abdominal pain every day for the past 3 months. She reports onset of nausea and vomiting one week ago and fever yesterday. No diarrhea. She had a cholecystectomy in 2010. Marland Kitchen    Past Medical History  Diagnosis Date  . No pertinent past medical history     Past Surgical History  Procedure Laterality Date  . Cholecystectomy    . Cesarean section      History reviewed. No pertinent family history.  History  Substance Use Topics  . Smoking status: Never Smoker   . Smokeless tobacco: Never Used  . Alcohol Use: No    OB History   Grav Para Term Preterm Abortions TAB SAB Ect Mult Living                  Review of Systems  Constitutional: Positive for fever. Negative for chills.  Respiratory: Positive for shortness of breath.        She reports that deep breathing makes the pain in the RUQ worse.  Cardiovascular: Negative.  Negative for chest pain.  Gastrointestinal: Positive for nausea, vomiting and abdominal pain. Negative for diarrhea.  Genitourinary: Negative for dysuria.  Musculoskeletal: Negative.   Skin: Negative.     Allergies  Review of patient's allergies indicates no known allergies.  Home Medications   Current Outpatient Rx  Name   Route  Sig  Dispense  Refill  . ciprofloxacin (CIPRO) 500 MG tablet   Oral   Take 1 tablet (500 mg total) by mouth 2 (two) times daily. [employer] veces al dia por 10 dias.   20 tablet   0   . HYDROcodone-acetaminophen (HYCET) 7.5-325 mg/15 ml solution   Oral   Take 10 mLs by mouth 4 (four) times daily as needed for pain.   75 mL   0   . HYDROcodone-acetaminophen (NORCO) 5-325 MG per tablet   Oral   Take 1-2 tablets by mouth every 4 (four) hours as needed for pain. Toma cuando neccesarios cada 4 horas por dolor.   20 tablet   0   . metroNIDAZOLE (FLAGYL) 500 MG tablet   Oral   Take 1 tablet (500 mg total) by mouth 3 (three) times daily. Una pastilla cada 8 horas por 10 dias.  No toma alcohol con esta medicamento.   30 tablet   0   . promethazine (PHENERGAN) 12.5 MG tablet   Oral   Take 1 tablet (12.5 mg total) by mouth every 6 (six) hours as needed. Una pastilla cada 6 horas cuando neccesitas por nausea   30 tablet   0     BP 107/57  Pulse 70  Temp(Src) 97.8 F (36.6 C) (Oral)  Resp 12  SpO2 100%  Physical Exam  Constitutional: She is oriented to person, place, and time.  She appears well-developed and well-nourished.  HENT:  Head: Normocephalic.  Neck: Normal range of motion. Neck supple.  Cardiovascular: Normal rate and regular rhythm.   Pulmonary/Chest: Effort normal. She has no wheezes. She has no rales. She exhibits no tenderness.  Abdominal: Soft. Bowel sounds are normal. There is tenderness. There is no rebound and no guarding.  RUQ tenderness that is moderate with mild guarding. Soft, obese abdomen, hypoactive bowel sounds.   Musculoskeletal: Normal range of motion.  Neurological: She is alert and oriented to person, place, and time.  Skin: Skin is warm and dry. No rash noted.  Psychiatric: She has a normal mood and affect.    ED Course  Procedures (including critical care time)  Labs Reviewed  URINALYSIS, ROUTINE W REFLEX MICROSCOPIC -  Abnormal; Notable for the following:    APPearance CLOUDY (*)    Hgb urine dipstick TRACE (*)    All other components within normal limits  COMPREHENSIVE METABOLIC PANEL - Abnormal; Notable for the following:    Glucose, Bld 106 (*)    All other components within normal limits  URINE MICROSCOPIC-ADD ON - Abnormal; Notable for the following:    Bacteria, UA MANY (*)    All other components within normal limits  CBC WITH DIFFERENTIAL  LIPASE, BLOOD  POCT PREGNANCY, URINE   No results found.   No diagnosis found.    MDM  Pain no better with IV morphine, nausea improved. Labs/x-ray pending. Patient care transferred to [facility], PA-C. Discussed with Dr. Manus Gunning.        Arnoldo Hooker, PA-C 02/06/13 1126

## 2013-02-06 NOTE — ED Provider Notes (Signed)
Care assumed from [place], [place].  Lorraine [name] is a 27 y.o. female presents with RQU and pain for 3 mos, worsening in the past week with associated nausea and vomiting.  Pt is 4 years s/p cholecystectomy.  Pain also with pain on inspiration, but no SOB or cough.   On exam, pt with significant RUQ tenderness to palpation without rigidity and hypoactive bowel sounds.  Pt remains in pain on re-evaluation, but without further emesis in the department.  Plan: Pt pending CT abd.  If negative, will have her follow-up with GI. Will continue IV pain control.    2:13 PM Pt CT without acute intraabdominal pathology.  I personally reviewed the imaging tests through PACS system.  I reviewed available ER/hospitalization records through the EMR.  Pt pain control redosed.   Patient is nontoxic, nonseptic appearing, in no apparent distress.  Patient's pain and other symptoms adequately managed in emergency department.  Fluid bolus given.  Labs, imaging and vitals reviewed.  Patient does not meet the SIRS or Sepsis criteria.  On repeat exam patient does not have a surgical abdomin and there are nor peritoneal signs.  No indication of appendicitis, bowel obstruction, bowel perforation, cholecystitis, diverticulitis, PID or ectopic pregnancy.  Patient discharged home with symptomatic treatment and given strict instructions for follow-up with GI.  I have also discussed reasons to return immediately to the ER.  Patient expresses understanding and agrees with plan.      Dahlia Client Canisha Issac, PA-C 02/06/13 1451

## 2013-02-06 NOTE — ED Notes (Addendum)
Pt states she has R sided abd pain and ha.  Pt states she threw up yesterday.  Pt states she has had pain for 3 months.

## 2013-08-28 ENCOUNTER — Encounter (HOSPITAL_COMMUNITY): Payer: Self-pay | Admitting: Emergency Medicine

## 2013-08-28 DIAGNOSIS — R1031 Right lower quadrant pain: Secondary | ICD-10-CM | POA: Insufficient documentation

## 2013-08-28 DIAGNOSIS — Z9089 Acquired absence of other organs: Secondary | ICD-10-CM | POA: Insufficient documentation

## 2013-08-28 DIAGNOSIS — R197 Diarrhea, unspecified: Secondary | ICD-10-CM | POA: Insufficient documentation

## 2013-08-28 DIAGNOSIS — K5289 Other specified noninfective gastroenteritis and colitis: Secondary | ICD-10-CM | POA: Insufficient documentation

## 2013-08-28 DIAGNOSIS — R112 Nausea with vomiting, unspecified: Secondary | ICD-10-CM | POA: Insufficient documentation

## 2013-08-28 DIAGNOSIS — N83209 Unspecified ovarian cyst, unspecified side: Secondary | ICD-10-CM | POA: Insufficient documentation

## 2013-08-28 DIAGNOSIS — A499 Bacterial infection, unspecified: Secondary | ICD-10-CM | POA: Insufficient documentation

## 2013-08-28 DIAGNOSIS — N760 Acute vaginitis: Secondary | ICD-10-CM | POA: Insufficient documentation

## 2013-08-28 DIAGNOSIS — B9689 Other specified bacterial agents as the cause of diseases classified elsewhere: Secondary | ICD-10-CM | POA: Insufficient documentation

## 2013-08-28 DIAGNOSIS — D72829 Elevated white blood cell count, unspecified: Secondary | ICD-10-CM | POA: Insufficient documentation

## 2013-08-28 LAB — CBC WITH DIFFERENTIAL/PLATELET
Basophils Absolute: 0 10*3/uL (ref 0.0–0.1)
Eosinophils Absolute: 0.1 10*3/uL (ref 0.0–0.7)
Eosinophils Relative: 1 % (ref 0–5)
HCT: 38.9 % (ref 36.0–46.0)
Hemoglobin: 13.6 g/dL (ref 12.0–15.0)
Lymphs Abs: 4.2 10*3/uL — ABNORMAL HIGH (ref 0.7–4.0)
MCH: 31.9 pg (ref 26.0–34.0)
MCV: 91.1 fL (ref 78.0–100.0)
Monocytes Absolute: 0.7 10*3/uL (ref 0.1–1.0)
Platelets: 291 10*3/uL (ref 150–400)
RBC: 4.27 MIL/uL (ref 3.87–5.11)
RDW: 12.2 % (ref 11.5–15.5)

## 2013-08-28 LAB — POCT PREGNANCY, URINE: Preg Test, Ur: NEGATIVE

## 2013-08-28 NOTE — ED Notes (Addendum)
Pt. reports RLQ pain with nausea, vomitting and diarrhea onset 5 days ago , slight headache , denies dysuria or fever .

## 2013-08-29 ENCOUNTER — Encounter (HOSPITAL_COMMUNITY)
Admission: EM | Disposition: A | Discharge status: Home / Self Care | Payer: Self-pay | Source: Home / Self Care | Attending: Emergency Medicine

## 2013-08-29 ENCOUNTER — Inpatient Hospital Stay (HOSPITAL_COMMUNITY)
Admission: EM | Admit: 2013-08-29 | Discharge: 2013-08-29 | Disposition: A | Discharge status: Home / Self Care | Payer: Self-pay | Attending: Obstetrics & Gynecology | Admitting: Obstetrics & Gynecology

## 2013-08-29 ENCOUNTER — Encounter (HOSPITAL_COMMUNITY): Payer: Self-pay | Admitting: Obstetrics & Gynecology

## 2013-08-29 ENCOUNTER — Encounter (HOSPITAL_COMMUNITY): Payer: Self-pay

## 2013-08-29 ENCOUNTER — Emergency Department (HOSPITAL_COMMUNITY): Payer: Self-pay

## 2013-08-29 ENCOUNTER — Inpatient Hospital Stay (HOSPITAL_COMMUNITY): Payer: Self-pay

## 2013-08-29 DIAGNOSIS — R109 Unspecified abdominal pain: Secondary | ICD-10-CM

## 2013-08-29 DIAGNOSIS — A499 Bacterial infection, unspecified: Secondary | ICD-10-CM

## 2013-08-29 DIAGNOSIS — N83209 Unspecified ovarian cyst, unspecified side: Secondary | ICD-10-CM

## 2013-08-29 DIAGNOSIS — R112 Nausea with vomiting, unspecified: Secondary | ICD-10-CM

## 2013-08-29 DIAGNOSIS — B9689 Other specified bacterial agents as the cause of diseases classified elsewhere: Secondary | ICD-10-CM

## 2013-08-29 DIAGNOSIS — N760 Acute vaginitis: Secondary | ICD-10-CM

## 2013-08-29 DIAGNOSIS — K529 Noninfective gastroenteritis and colitis, unspecified: Secondary | ICD-10-CM

## 2013-08-29 HISTORY — DX: Diverticulitis of intestine, part unspecified, without perforation or abscess without bleeding: K57.92

## 2013-08-29 LAB — COMPREHENSIVE METABOLIC PANEL
ALT: 46 U/L — ABNORMAL HIGH (ref 0–35)
AST: 36 U/L (ref 0–37)
Albumin: 4.2 g/dL (ref 3.5–5.2)
BUN: 17 mg/dL (ref 6–23)
CO2: 28 mEq/L (ref 19–32)
Calcium: 9.5 mg/dL (ref 8.4–10.5)
Creatinine, Ser: 0.73 mg/dL (ref 0.50–1.10)
GFR calc Af Amer: >90 mL/min (ref >90)
GFR calc non Af Amer: >90 mL/min (ref >90)
Glucose, Bld: 85 mg/dL (ref 70–99)
Sodium: 140 mEq/L (ref 135–145)
Total Protein: 7.9 g/dL (ref 6.0–8.3)

## 2013-08-29 LAB — WET PREP, GENITAL
Trich, Wet Prep: NONE SEEN
Yeast Wet Prep HPF POC: NONE SEEN

## 2013-08-29 LAB — RPR: RPR Ser Ql: NONREACTIVE

## 2013-08-29 LAB — URINALYSIS, ROUTINE W REFLEX MICROSCOPIC
Glucose, UA: NEGATIVE mg/dL
Hgb urine dipstick: NEGATIVE
Leukocytes, UA: NEGATIVE
Protein, ur: NEGATIVE mg/dL
Specific Gravity, Urine: 1.028 (ref 1.005–1.030)
Urobilinogen, UA: 0.2 mg/dL (ref 0.0–1.0)

## 2013-08-29 LAB — HIV ANTIBODY (ROUTINE TESTING W REFLEX): HIV: NONREACTIVE

## 2013-08-29 SURGERY — LAPAROSCOPY, DIAGNOSTIC
Anesthesia: General

## 2013-08-29 MED ORDER — FENTANYL CITRATE 0.05 MG/ML IJ SOLN
50.0000 ug | INTRAMUSCULAR | Status: DC | PRN
  Administered 2013-08-29: 50 ug via INTRAVENOUS
  Filled 2013-08-29: qty 2

## 2013-08-29 MED ORDER — ROCURONIUM BROMIDE 100 MG/10ML IV SOLN
INTRAVENOUS | Status: AC
  Filled 2013-08-29: qty 1

## 2013-08-29 MED ORDER — ONDANSETRON HCL 4 MG/2ML IJ SOLN
4.0000 mg | Freq: Once | INTRAMUSCULAR | Status: DC
  Filled 2013-08-29: qty 2

## 2013-08-29 MED ORDER — ONDANSETRON HCL 4 MG/2ML IJ SOLN
4.0000 mg | Freq: Once | INTRAMUSCULAR | Status: AC
  Administered 2013-08-29: 4 mg via INTRAVENOUS
  Filled 2013-08-29: qty 2

## 2013-08-29 MED ORDER — DEXAMETHASONE SODIUM PHOSPHATE 10 MG/ML IJ SOLN
INTRAMUSCULAR | Status: AC
  Filled 2013-08-29: qty 1

## 2013-08-29 MED ORDER — FENTANYL CITRATE 0.05 MG/ML IJ SOLN
100.0000 ug | Freq: Once | INTRAMUSCULAR | Status: AC
  Administered 2013-08-29: 100 ug via INTRAVENOUS
  Filled 2013-08-29: qty 2

## 2013-08-29 MED ORDER — KETOROLAC TROMETHAMINE 60 MG/2ML IM SOLN
60.0000 mg | Freq: Once | INTRAMUSCULAR | Status: AC
  Administered 2013-08-29: 60 mg via INTRAMUSCULAR
  Filled 2013-08-29: qty 2

## 2013-08-29 MED ORDER — IOHEXOL 300 MG/ML  SOLN
100.0000 mL | Freq: Once | INTRAMUSCULAR | Status: AC | PRN
  Administered 2013-08-29: 100 mL via INTRAVENOUS

## 2013-08-29 MED ORDER — HYDROMORPHONE HCL PF 1 MG/ML IJ SOLN
1.0000 mg | Freq: Once | INTRAMUSCULAR | Status: AC
  Administered 2013-08-29: 1 mg via INTRAVENOUS
  Filled 2013-08-29: qty 1

## 2013-08-29 MED ORDER — ONDANSETRON 4 MG PO TBDP: 4.0000 mg | ORAL_TABLET | Freq: Three times a day (TID) | ORAL | Status: DC | PRN

## 2013-08-29 MED ORDER — IBUPROFEN 600 MG PO TABS: 600.0000 mg | ORAL_TABLET | Freq: Four times a day (QID) | ORAL | Status: DC | PRN

## 2013-08-29 MED ORDER — FENTANYL CITRATE 0.05 MG/ML IJ SOLN
INTRAMUSCULAR | Status: AC
  Filled 2013-08-29: qty 5

## 2013-08-29 MED ORDER — HYDROCODONE-ACETAMINOPHEN 5-325 MG PO TABS: 1.0000 | ORAL_TABLET | Freq: Four times a day (QID) | ORAL | Status: DC | PRN

## 2013-08-29 MED ORDER — FENTANYL CITRATE 0.05 MG/ML IJ SOLN
INTRAMUSCULAR | Status: AC
  Filled 2013-08-29: qty 2

## 2013-08-29 MED ORDER — LIDOCAINE HCL (CARDIAC) 20 MG/ML IV SOLN
INTRAVENOUS | Status: AC
  Filled 2013-08-29: qty 5

## 2013-08-29 MED ORDER — IOHEXOL 300 MG/ML  SOLN
20.0000 mL | INTRAMUSCULAR | Status: AC
  Administered 2013-08-29: 25 mL via ORAL

## 2013-08-29 MED ORDER — PROMETHAZINE HCL 25 MG/ML IJ SOLN
25.0000 mg | Freq: Once | INTRAMUSCULAR | Status: AC
  Administered 2013-08-29: 25 mg via INTRAVENOUS
  Filled 2013-08-29: qty 1

## 2013-08-29 MED ORDER — METRONIDAZOLE 500 MG PO TABS: 500.0000 mg | ORAL_TABLET | Freq: Two times a day (BID) | ORAL | Status: DC

## 2013-08-29 MED ORDER — GLYCOPYRROLATE 0.2 MG/ML IJ SOLN
INTRAMUSCULAR | Status: AC
  Filled 2013-08-29: qty 1

## 2013-08-29 MED ORDER — MIDAZOLAM HCL 2 MG/2ML IJ SOLN
INTRAMUSCULAR | Status: AC
  Filled 2013-08-29: qty 2

## 2013-08-29 MED ORDER — SODIUM CHLORIDE 0.9 % IV SOLN
INTRAVENOUS | Status: DC
  Administered 2013-08-29: 07:00:00 via INTRAVENOUS
  Administered 2013-08-29: 1000 mL via INTRAVENOUS

## 2013-08-29 MED ORDER — PROPOFOL 10 MG/ML IV EMUL
INTRAVENOUS | Status: AC
  Filled 2013-08-29: qty 20

## 2013-08-29 MED ORDER — ONDANSETRON HCL 4 MG/2ML IJ SOLN
4.0000 mg | Freq: Once | INTRAMUSCULAR | Status: AC
  Administered 2013-08-29: 4 mg via INTRAVENOUS
  Filled 2013-08-29 (×2): qty 2

## 2013-08-29 MED ORDER — PROMETHAZINE HCL 25 MG PO TABS: 25.0000 mg | ORAL_TABLET | Freq: Four times a day (QID) | ORAL | Status: DC | PRN

## 2013-08-29 MED ORDER — ONDANSETRON 4 MG PO TBDP
4.0000 mg | ORAL_TABLET | Freq: Four times a day (QID) | ORAL | Status: DC | PRN
  Administered 2013-08-29: 4 mg via ORAL
  Filled 2013-08-29: qty 1

## 2013-08-29 MED ORDER — ONDANSETRON HCL 4 MG/2ML IJ SOLN
INTRAMUSCULAR | Status: AC
  Filled 2013-08-29: qty 2

## 2013-08-29 MED ORDER — SODIUM CHLORIDE 0.9 % IV SOLN
Freq: Once | INTRAVENOUS | Status: AC
  Administered 2013-08-29: 13:00:00 via INTRAVENOUS

## 2013-08-29 SURGICAL SUPPLY — 25 items
BAG SPEC RTRVL LRG 6X4 10 (ENDOMECHANICALS)
CABLE HIGH FREQUENCY MONO STRZ (ELECTRODE) IMPLANT
CHLORAPREP W/TINT 26ML (MISCELLANEOUS) ×1 IMPLANT
CLOTH BEACON ORANGE TIMEOUT ST (SAFETY) ×1 IMPLANT
COVER MAYO STAND STRL (DRAPES) ×1 IMPLANT
GLOVE BIO SURGEON STRL SZ7 (GLOVE) ×1 IMPLANT
GLOVE BIOGEL PI IND STRL 7.0 (GLOVE) ×1 IMPLANT
GLOVE BIOGEL PI INDICATOR 7.0 (GLOVE)
GOWN PREVENTION PLUS LG XLONG (DISPOSABLE) ×2 IMPLANT
NEEDLE INSUFFLATION 120MM (ENDOMECHANICALS) ×1 IMPLANT
NS IRRIG 1000ML POUR BTL (IV SOLUTION) ×1 IMPLANT
PACK LAPAROSCOPY BASIN (CUSTOM PROCEDURE TRAY) ×1 IMPLANT
POUCH SPECIMEN RETRIEVAL 10MM (ENDOMECHANICALS) IMPLANT
PROTECTOR NERVE ULNAR (MISCELLANEOUS) ×1 IMPLANT
SCALPEL HARMONIC ACE (MISCELLANEOUS) IMPLANT
SET IRRIG TUBING LAPAROSCOPIC (IRRIGATION / IRRIGATOR) IMPLANT
SUT VIC AB 3-0 X1 27 (SUTURE) IMPLANT
SUT VICRYL 0 UR6 27IN ABS (SUTURE) ×2 IMPLANT
SUT VICRYL 4-0 PS2 18IN ABS (SUTURE) ×1 IMPLANT
TOWEL OR 17X24 6PK STRL BLUE (TOWEL DISPOSABLE) ×2 IMPLANT
TRAY FOLEY CATH 14FR (SET/KITS/TRAYS/PACK) ×1 IMPLANT
TROCAR XCEL NON-BLD 11X100MML (ENDOMECHANICALS) ×1 IMPLANT
TROCAR XCEL NON-BLD 5MMX100MML (ENDOMECHANICALS) IMPLANT
WARMER LAPAROSCOPE (MISCELLANEOUS) ×1 IMPLANT
WATER STERILE IRR 1000ML POUR (IV SOLUTION) ×1 IMPLANT

## 2013-08-29 NOTE — ED Provider Notes (Signed)
CSN: [id number]     Arrival date & time 08/28/13  2316 History   First MD Initiated Contact with Patient 08/29/13 0220     Chief Complaint  Patient presents with  . Abdominal Pain   (Consider location/radiation/quality/duration/timing/severity/associated sxs/prior Treatment) HPI History provided by patient. Spanish-speaking predominantly. Right lower quadrant pain onset 4-5 days ago gradually worsening. Moderate to severe and sharp in quality. No associated vaginal bleeding or discharge. Does have associated nausea and vomiting and some loose stools but no blood in emesis or stool. She denies history of same. Pain is not radiating. Past Medical History  Diagnosis Date  . No pertinent past medical history   . Diverticulitis    Past Surgical History  Procedure Laterality Date  . Cholecystectomy    . Cesarean section     No family history on file. History  Substance Use Topics  . Smoking status: Never Smoker   . Smokeless tobacco: Never Used  . Alcohol Use: No   OB History   Grav Para Term Preterm Abortions TAB SAB Ect Mult Living                 Review of Systems  Constitutional: Negative for fever and chills.  Respiratory: Negative for shortness of breath.   Cardiovascular: Negative for chest pain.  Gastrointestinal: Positive for vomiting and abdominal pain.  Genitourinary: Negative for dysuria.  Musculoskeletal: Negative for back pain, neck pain and neck stiffness.  Skin: Negative for rash.  Neurological: Negative for headaches.  All other systems reviewed and are negative.    Allergies  Review of patient's allergies indicates no known allergies.  Home Medications   Current Outpatient Rx  Name  Route  Sig  Dispense  Refill  . acetaminophen (TYLENOL) 500 MG tablet   Oral   Take 1,000 mg by mouth every 6 (six) hours as needed for mild pain or fever.          Marland Kitchen ibuprofen (ADVIL,MOTRIN) 200 MG tablet   Oral   Take 400 mg by mouth every 6 (six) hours as needed  for fever or mild pain.          BP 92/49  Pulse 62  Temp(Src) 99.9 F (37.7 C) (Oral)  Resp 18  SpO2 99% Physical Exam  Constitutional: She is oriented to person, place, and time. She appears well-developed and well-nourished.  HENT:  Head: Normocephalic and atraumatic.  Eyes: EOM are normal. Pupils are equal, round, and reactive to light.  Neck: Neck supple.  Cardiovascular: Normal rate, regular rhythm and intact distal pulses.   Pulmonary/Chest: Effort normal. No respiratory distress.  Abdominal: Soft. Bowel sounds are normal. She exhibits no distension. There is no rebound.  Tender right lower quadrant  Genitourinary:  Normal external GU, moderate white discharge. Right adnexal tenderness  Musculoskeletal: Normal range of motion. She exhibits no edema.  Neurological: She is alert and oriented to person, place, and time.  Skin: Skin is warm and dry.    ED Course  Procedures (including critical care time) Labs Review Labs Reviewed  WET PREP, GENITAL - Abnormal; Notable for the following:    Clue Cells Wet Prep HPF POC MODERATE (*)    WBC, Wet Prep HPF POC FEW (*)    All other components within normal limits  CBC WITH DIFFERENTIAL - Abnormal; Notable for the following:    WBC 11.7 (*)    Lymphs Abs 4.2 (*)    All other components within normal limits  COMPREHENSIVE METABOLIC PANEL -  Abnormal; Notable for the following:    ALT 46 (*)    Total Bilirubin 0.2 (*)    All other components within normal limits  URINALYSIS, ROUTINE W REFLEX MICROSCOPIC - Abnormal; Notable for the following:    APPearance CLOUDY (*)    All other components within normal limits  GC/CHLAMYDIA PROBE AMP  LIPASE, BLOOD  RPR  HIV ANTIBODY (ROUTINE TESTING)  POCT PREGNANCY, URINE   Imaging Review Ct Abdomen Pelvis W Contrast  08/29/2013   CLINICAL DATA:  Right lower quadrant abdominal pain, nausea, vomiting and diarrhea.  EXAM: CT ABDOMEN AND PELVIS WITH CONTRAST  TECHNIQUE: Multidetector CT  imaging of the abdomen and pelvis was performed using the standard protocol following bolus administration of intravenous contrast.  CONTRAST:  OMNIPAQUE IOHEXOL 300 MG/ML  SOLN  COMPARISON:  CT of the abdomen and pelvis performed 02/06/2013  FINDINGS: The visualized lung bases are clear.  The liver and spleen are unremarkable in appearance. The patient is status post cholecystectomy, with clips noted at the gallbladder fossa. The pancreas and adrenal glands are unremarkable.  The kidneys are unremarkable in appearance. There is no evidence of hydronephrosis. No renal or ureteral stones are seen. No perinephric stranding is appreciated.  No free fluid is identified. The small bowel is unremarkable in appearance. The stomach is within normal limits. No acute vascular abnormalities are seen.  The appendix is normal in caliber, without evidence for appendicitis. The colon is unremarkable in appearance.  The bladder is mildly distended and grossly unremarkable. The uterus is within normal limits. The intrauterine device is noted in expected position at the fundus of the uterus. The ovaries are slightly asymmetric, right larger than left, but no suspicious adnexal masses are seen. No inguinal lymphadenopathy is seen.  No acute osseous abnormalities are identified.  IMPRESSION: No acute abnormality seen within the abdomen or pelvis.   Electronically Signed   By: Roanna Raider M.D.   On: 08/29/2013 06:10   IV fluids. IV fentanyl and Zofran provided  Patient unable to hold down oral contrast has persistent vomiting. She has persistent right lower quadrant pain. CT reviewed as above without acute abnormality. Ultrasound ordered to evaluate ovarian flow. Continued IV narcotics pain control.  MDM  Diagnosis: Abdominal pain      Sunnie Nielsen, MD 08/29/13 [phone]

## 2013-08-29 NOTE — ED Notes (Addendum)
Used language line to interpret, interpreter # [id number].  Pt has had lower right abdominal pain x 5 days with nausea.  Yesterday began vomiting x6 and a little diarrhea reported.  Denies pregnancy, has IUD.

## 2013-08-29 NOTE — MAU Note (Signed)
Patient arrives to MAU via [facility] from [facility]; vomiting at bedside. Reports vomiting worse since pain med given. Pain remains 7/10, RLQ constant. Interpreter Roxie at bedside.

## 2013-08-29 NOTE — ED Provider Notes (Addendum)
9:40 AM Patient still having pain and nausea.  I explained the [place] results to them.  1:52 PM Patient still in pain, though she appears slightly better..  I discussed f/u instructions with the husband.   Gerhard Munch, MD 08/29/13 1352  2:34 PM An attempt to get the patient upright, she had recurrence of significant nausea and pain. With the refractory nature of her nausea, vomiting, abdominal pain, previously diagnosed ovarian cyst, I discussed her case with our gynecologist on call.  The patient has no fever, labs are largely reassuring, with her continued abdominal pain, ovarian cyst, she will be transferred to our women's facility for further evaluation and management. The patient and her husband are aware of this, agree to this plan.  Gerhard Munch, MD 08/29/13 1435

## 2013-08-29 NOTE — MAU Provider Note (Signed)
OB/GYN Attending MAU Note  Chief Complaint: Abdominal Pain   SUBJECTIVE HPI: Lorraine [name] is a 27 y.o. [id number] who was transferred from [facility] ER for possible diagnostic laparoscopy to rule out torsion in the setting a right ovarian cyst, persistent pain, nausea and vomiting.  Patient is Spanish-speaking only, a face-to-face Spanish interpreter is present for this encounter.  Patient reports that her symptoms started 5 days ago, and was characterized by nausea, vomiting and diarrhea.  She presented to [facility] ER early this morning around 0130 and was evaluated; she had RLQ pain on exam, WBC slightly elevated to 11.7, CT scan negative for appendicitis; U/S showed 2.4 cm right ovarian simple cyst without torsion.  She was treated with multiple doses of IV narcotics which helped her pain a little but made her feel more nauseated, as per her report. She reports that the vomiting exacerbated her abdominal pain, she has minimal pain when she was not vomiting.  Also reports continued loose bowel movements, no blood in stools noted.  No fevers, sick contacts, or other symptoms.  Past Medical History  Diagnosis Date  . Diverticulitis    OB History  Gravida Para Term Preterm AB SAB TAB Ectopic Multiple Living  4 2 2  2 2    2     # Outcome Date GA Lbr Len/2nd Weight Sex Delivery Anes PTL Lv  4 SAB           3 SAB           2 TRM      CS     1 TRM      CS        Past Surgical History  Procedure Laterality Date  . Cholecystectomy    . Cesarean section     History   Social History  . Marital Status: Married    Spouse Name: N/A    Number of Children: N/A  . Years of Education: N/A   Occupational History  . Not on file.   Social History Main Topics  . Smoking status: Never Smoker   . Smokeless tobacco: Never Used  . Alcohol Use: No  . Drug Use: No  . Sexual Activity: Not on file   Other Topics Concern  . Not on file   Social History Narrative   ** Merged History Encounter **       No  current facility-administered medications on file prior to encounter.   Current Outpatient Prescriptions on File Prior to Encounter  Medication Sig Dispense Refill  . acetaminophen (TYLENOL) 500 MG tablet Take 1,000 mg by mouth every 6 (six) hours as needed for mild pain or fever.        No Known Allergies  ROS: Pertinent items in HPI  OBJECTIVE Blood pressure 115/81, pulse 85, temperature 98.6 F (37 C), temperature source Oral, resp. rate 20, SpO2 98.00%. GENERAL: Well-developed, well-nourished female in no acute distress.  HEENT: Normocephalic, atraumatic HEART: Regular rate and regular RESP: Normal effort, no breathing difficult ABDOMEN: Soft, mild-moderate tenderness in RLQ, no rebound, no guarding PELVIC: Deferred EXTREMITIES: Nontender, no edema   LAB RESULTS Results for orders placed during the hospital encounter of 08/29/13 (from the past 24 hour(s))  CBC WITH DIFFERENTIAL     Status: Abnormal   Collection Time    08/28/13 11:29 PM      Result Value Range   WBC 11.7 (*) 4.0 - 10.5 K/uL   RBC 4.27  3.87 - 5.11 MIL/uL   Hemoglobin 13.6  12.0 - 15.0 g/dL   HCT 62.9  52.8 - 41.3 %   MCV 91.1  78.0 - 100.0 fL   MCH 31.9  26.0 - 34.0 pg   MCHC 35.0  30.0 - 36.0 g/dL   RDW 24.4  01.0 - 27.2 %   Platelets 291  150 - 400 K/uL   Neutrophils Relative % 58  43 - 77 %   Neutro Abs 6.7  1.7 - 7.7 K/uL   Lymphocytes Relative 36  12 - 46 %   Lymphs Abs 4.2 (*) 0.7 - 4.0 K/uL   Monocytes Relative 6  3 - 12 %   Monocytes Absolute 0.7  0.1 - 1.0 K/uL   Eosinophils Relative 1  0 - 5 %   Eosinophils Absolute 0.1  0.0 - 0.7 K/uL   Basophils Relative 0  0 - 1 %   Basophils Absolute 0.0  0.0 - 0.1 K/uL  COMPREHENSIVE METABOLIC PANEL     Status: Abnormal   Collection Time    08/28/13 11:29 PM      Result Value Range   Sodium 140  135 - 145 mEq/L   Potassium 3.6  3.5 - 5.1 mEq/L   Chloride 102  96 - 112 mEq/L   CO2 28  19 - 32 mEq/L   Glucose, Bld 85  70 - 99 mg/dL   BUN 17  6  - 23 mg/dL   Creatinine, Ser 5.36  0.50 - 1.10 mg/dL   Calcium 9.5  8.4 - 64.4 mg/dL   Total Protein 7.9  6.0 - 8.3 g/dL   Albumin 4.2  3.5 - 5.2 g/dL   AST 36  0 - 37 U/L   ALT 46 (*) 0 - 35 U/L   Alkaline Phosphatase 110  39 - 117 U/L   Total Bilirubin 0.2 (*) 0.3 - 1.2 mg/dL   GFR calc non Af Amer >90  >90 mL/min   GFR calc Af Amer >90  >90 mL/min  LIPASE, BLOOD     Status: None   Collection Time    08/28/13 11:29 PM      Result Value Range   Lipase 27  11 - 59 U/L  URINALYSIS, ROUTINE W REFLEX MICROSCOPIC     Status: Abnormal   Collection Time    08/28/13 11:43 PM      Result Value Range   Color, Urine YELLOW  YELLOW   APPearance CLOUDY (*) CLEAR   Specific Gravity, Urine 1.028  1.005 - 1.030   pH 6.0  5.0 - 8.0   Glucose, UA NEGATIVE  NEGATIVE mg/dL   Hgb urine dipstick NEGATIVE  NEGATIVE   Bilirubin Urine NEGATIVE  NEGATIVE   Ketones, ur NEGATIVE  NEGATIVE mg/dL   Protein, ur NEGATIVE  NEGATIVE mg/dL   Urobilinogen, UA 0.2  0.0 - 1.0 mg/dL   Nitrite NEGATIVE  NEGATIVE   Leukocytes, UA NEGATIVE  NEGATIVE  POCT PREGNANCY, URINE     Status: None   Collection Time    08/28/13 11:44 PM      Result Value Range   Preg Test, Ur NEGATIVE  NEGATIVE  RPR     Status: None   Collection Time    08/29/13  2:35 AM      Result Value Range   RPR NON REACTIVE  NON REACTIVE  HIV ANTIBODY (ROUTINE TESTING)     Status: None   Collection Time    08/29/13  2:35 AM      Result Value Range  HIV NON REACTIVE  NON REACTIVE  WET PREP, GENITAL     Status: Abnormal   Collection Time    08/29/13  6:36 AM      Result Value Range   Yeast Wet Prep HPF POC NONE SEEN  NONE SEEN   Trich, Wet Prep NONE SEEN  NONE SEEN   Clue Cells Wet Prep HPF POC MODERATE (*) NONE SEEN   WBC, Wet Prep HPF POC FEW (*) NONE SEEN    IMAGING 08/29/2013  TRANSABDOMINAL ULTRASOUND OF PELVIS  DOPPLER ULTRASOUND OF OVARIES  CLINICAL DATA:  Right lower quadrant pain nausea, vomiting and diarrhea.   TECHNIQUE:  Transabdominal ultrasound examination of the pelvis was performed including evaluation of the uterus, ovaries, adnexal regions, and pelvic cul-de-sac.  Color and duplex Doppler ultrasound was utilized to evaluate blood flow to the ovaries.  COMPARISON:  Current abdominal pelvic CT, 08/29/2013. Pelvic ultrasound, 09/29/2009.  FINDINGS: Uterus  Measurements: 7.6 cm x 4.7 cm x 4.7 cm. No fibroids or other mass visualized.  Endometrium  Thickness: 7 mm. Shadowing is noted from a well-positioned IUD in the endometrial canal. No endometrial mass or fluid.  Right ovary  Measurements: 4.4 cm x 2.1 cm x 2.5 cm. Multiple follicular cysts. There is a dominant cyst measuring 2.4 cm in greatest dimension. Cysts have a simple appearance.  Left ovary  Measurements: 3.4 cm x 1.5 cm x 1.5 cm. Normal appearance/no adnexal mass.  Pulsed Doppler evaluation demonstrates normal low-resistance arterial and venous waveforms in both ovaries.  IMPRESSION: 1. No ovarian torsion.  No acute findings. 2. Dominant, 2.4 cm right ovarian follicular cyst. No followup evaluation is indicated. 3. Exam otherwise unremarkable. No adnexal masses or free fluid. No findings to explain right lower quadrant pain.   Electronically Signed   By: Amie Portland M.D.   On: 08/29/2013 08:45    08/29/2013   CT ABDOMEN AND PELVIS WITH CONTRAST CLINICAL DATA:  Right lower quadrant abdominal pain, nausea, vomiting and diarrhea. TECHNIQUE: Multidetector CT imaging of the abdomen and pelvis was performed using the standard protocol following bolus administration of intravenous contrast.  CONTRAST:  OMNIPAQUE IOHEXOL 300 MG/ML  SOLN  COMPARISON:  CT of the abdomen and pelvis performed 02/06/2013  FINDINGS: The visualized lung bases are clear.  The liver and spleen are unremarkable in appearance. The patient is status post cholecystectomy, with clips noted at the gallbladder fossa. The pancreas and adrenal glands are unremarkable.  The kidneys are unremarkable in  appearance. There is no evidence of hydronephrosis. No renal or ureteral stones are seen. No perinephric stranding is appreciated.  No free fluid is identified. The small bowel is unremarkable in appearance. The stomach is within normal limits. No acute vascular abnormalities are seen.  The appendix is normal in caliber, without evidence for appendicitis. The colon is unremarkable in appearance.  The bladder is mildly distended and grossly unremarkable. The uterus is within normal limits. The intrauterine device is noted in expected position at the fundus of the uterus. The ovaries are slightly asymmetric, right larger than left, but no suspicious adnexal masses are seen. No inguinal lymphadenopathy is seen.  No acute osseous abnormalities are identified.  IMPRESSION: No acute abnormality seen within the abdomen or pelvis.   Electronically Signed   By: Roanna Raider M.D.   On: 08/29/2013 06:10   MAU COURSE After talking to patient at length with the Spanish interpreter, her presentation is not consistent with ovarian torsion or other acute gynecologic etiology.  Reviewed ultrasound images, the 2.4 cm cyst is simple and resembles a follicular cyst.  Given that she had nausea, vomiting and diarrhea for 5 days and mildly elevated WBC, this is likely consistent with ongoing gastroenteritis.  Patient also did report her symptoms were starting to improve, her pain is controlled if she is not vomiting.  There is no current urgent indication for surgery, patient is also very hesitant to undergo any diagnostic procedure. - Toradol 60mg  IM given around 1709 - Zofran 4 mg ODT given at 1716, patient's IV access was noted to be inflitrated and was discontinued  Patient reported feeling remarkably better; was discharged to home around 1750   ASSESSMENT 1. Gastroenteritis   2. Ovarian cyst   3. Bacterial vaginosis   4. Abdominal pain   5. Nausea and vomiting     PLAN Discharge home Vicodin, Zofran ODT,  Phenergan and Ibuprofen prescribed for patient Metronidazole prescribed for bacterial vaginosis She was told to return for any worsening symptoms       Follow-up Information   Follow up with [facility]. (Call to make appointment if symptoms worsen)    Contact information:   [address] [place] [address] [phone]       Medication List         acetaminophen 500 MG tablet  Commonly known as:  TYLENOL  Take 1,000 mg by mouth every 6 (six) hours as needed for mild pain or fever.     HYDROcodone-acetaminophen 5-325 MG per tablet  Commonly known as:  NORCO/VICODIN  Take 1-2 tablets by mouth every 6 (six) hours as needed.     ibuprofen 600 MG tablet  Commonly known as:  ADVIL,MOTRIN  Take 1 tablet (600 mg total) by mouth every 6 (six) hours as needed for fever or mild pain. Please take with food     metroNIDAZOLE 500 MG tablet  Commonly known as:  FLAGYL  Take 1 tablet (500 mg total) by mouth 2 (two) times daily.     ondansetron 4 MG disintegrating tablet  Commonly known as:  ZOFRAN ODT  Take 1 tablet (4 mg total) by mouth every 8 (eight) hours as needed for nausea or vomiting.     promethazine 25 MG tablet  Commonly known as:  PHENERGAN  Take 1 tablet (25 mg total) by mouth every 6 (six) hours as needed for nausea or vomiting.         Tereso Newcomer, MD 08/29/2013 5:49 PM

## 2013-08-29 NOTE — ED Notes (Signed)
Returned from radiology.  Pt sleeping when I entered the room;  No change in pain. Awaiting physician to perform pelvic exam.

## 2013-08-29 NOTE — Anesthesia Preprocedure Evaluation (Deleted)
Anesthesia Evaluation  Patient identified by MRN, date of birth, ID band Patient awake    Reviewed: Allergy & Precautions, H&P , NPO status , Patient's Chart, lab work & pertinent test results  Airway       Dental   Pulmonary neg pulmonary ROS,          Cardiovascular negative cardio ROS      Neuro/Psych negative neurological ROS  negative psych ROS   GI/Hepatic negative GI ROS, Neg liver ROS,   Endo/Other  negative endocrine ROS  Renal/GU negative Renal ROS  negative genitourinary   Musculoskeletal negative musculoskeletal ROS (+)   Abdominal (+) + obese,   Peds negative pediatric ROS (+)  Hematology negative hematology ROS (+)   Anesthesia Other Findings   Reproductive/Obstetrics negative OB ROS                           Anesthesia Physical Anesthesia Plan  ASA: II and emergent  Anesthesia Plan: General   Post-op Pain Management:    Induction: Intravenous, Rapid sequence and Cricoid pressure planned  Airway Management Planned: Oral ETT  Additional Equipment:   Intra-op Plan:   Post-operative Plan: Extubation in OR  Informed Consent: I have reviewed the patients History and Physical, chart, labs and discussed the procedure including the risks, benefits and alternatives for the proposed anesthesia with the patient or authorized representative who has indicated his/her understanding and acceptance.   Dental advisory given  Plan Discussed with: CRNA  Anesthesia Plan Comments:         Anesthesia Quick Evaluation

## 2013-08-29 NOTE — ED Notes (Signed)
Patient transported to CT via stretcher.

## 2013-08-29 NOTE — ED Notes (Signed)
Patient transported to Ultrasound 

## 2013-08-29 NOTE — Progress Notes (Addendum)
Faculty Practice OB/GYN Attending Note  Received call from Dr. Jeraldine Loots at [facility] ER about [name], 27 yo F with a 2.4 cm right ovarian cyst with no ultrasonographic evidence of torsion, but with persistent RLQ pain, nausea and vomiting since she presented over 15 hours ago.  CT has ruled out appendicitis or other etiology for her pain, she is afebrile and has a mildly elevated WBC of 11.7. Wet prep showed bacterial vaginosis, negative urinalysis.  She is not pregnant, and has an IUD in the appropriate position within the uterus.  Patient has been NPO since prior to presentation at [facility] ER. Her symptoms are still concerning for possible torsion; she will need diagnostic laparoscopy, possible ovarian cyst drainage or cystectomy.  Dr. Jeraldine Loots explained this to the patient and her husband; patient will be transferred here at [facility] for further evaluation and management.  MAU and OR notified.  Will discuss further details with patient and her husband when they arrive here at [facility].    Jaynie Collins, MD, FACOG Attending Obstetrician & Gynecologist Faculty Practice, [facility]

## 2013-08-29 NOTE — ED Notes (Signed)
Physician informed of low SBP.  Okay to give pt liter bolus NS, not to run at 136ml/hr.

## 2013-08-29 NOTE — ED Notes (Signed)
Pt unable to tolerate oral contrast. Drank approx 1/2 solution but vomited it up immediately.  CT notified.

## 2013-08-30 ENCOUNTER — Inpatient Hospital Stay: Admit: 2013-08-30 | Payer: Self-pay | Admitting: Obstetrics & Gynecology

## 2013-08-30 ENCOUNTER — Encounter (HOSPITAL_COMMUNITY)
Admission: EM | Disposition: A | Discharge status: Home / Self Care | Payer: Self-pay | Source: Home / Self Care | Attending: Emergency Medicine

## 2013-08-30 SURGERY — LAPAROSCOPY, DIAGNOSTIC
Anesthesia: General

## 2013-08-31 LAB — GC/CHLAMYDIA PROBE AMP: GC Probe RNA: NEGATIVE

## 2013-11-10 ENCOUNTER — Encounter (HOSPITAL_COMMUNITY): Payer: Self-pay | Admitting: Emergency Medicine

## 2013-11-10 DIAGNOSIS — R112 Nausea with vomiting, unspecified: Secondary | ICD-10-CM | POA: Insufficient documentation

## 2013-11-10 DIAGNOSIS — K5732 Diverticulitis of large intestine without perforation or abscess without bleeding: Secondary | ICD-10-CM | POA: Insufficient documentation

## 2013-11-10 DIAGNOSIS — R1031 Right lower quadrant pain: Secondary | ICD-10-CM | POA: Insufficient documentation

## 2013-11-10 LAB — CBC WITH DIFFERENTIAL/PLATELET
BASOS ABS: 0 10*3/uL (ref 0.0–0.1)
Basophils Relative: 0 % (ref 0–1)
EOS ABS: 0.1 10*3/uL (ref 0.0–0.7)
Eosinophils Relative: 1 % (ref 0–5)
HEMATOCRIT: 37.5 % (ref 36.0–46.0)
Hemoglobin: 13.1 g/dL (ref 12.0–15.0)
Lymphocytes Relative: 30 % (ref 12–46)
Lymphs Abs: 3 10*3/uL (ref 0.7–4.0)
MCH: 31.5 pg (ref 26.0–34.0)
MCHC: 34.9 g/dL (ref 30.0–36.0)
MCV: 90.1 fL (ref 78.0–100.0)
Monocytes Absolute: 0.5 10*3/uL (ref 0.1–1.0)
Monocytes Relative: 5 % (ref 3–12)
NEUTROS ABS: 6.5 10*3/uL (ref 1.7–7.7)
Neutrophils Relative %: 64 % (ref 43–77)
Platelets: 287 10*3/uL (ref 150–400)
RBC: 4.16 MIL/uL (ref 3.87–5.11)
RDW: 12 % (ref 11.5–15.5)
WBC: 10.2 10*3/uL (ref 4.0–10.5)

## 2013-11-10 LAB — URINALYSIS, ROUTINE W REFLEX MICROSCOPIC
Bilirubin Urine: NEGATIVE
Glucose, UA: NEGATIVE mg/dL
Hgb urine dipstick: NEGATIVE
KETONES UR: NEGATIVE mg/dL
LEUKOCYTES UA: NEGATIVE
Nitrite: NEGATIVE
PH: 6.5 (ref 5.0–8.0)
Protein, ur: NEGATIVE mg/dL
SPECIFIC GRAVITY, URINE: 1.027 (ref 1.005–1.030)
Urobilinogen, UA: 0.2 mg/dL (ref 0.0–1.0)

## 2013-11-10 LAB — LIPASE, BLOOD: Lipase: 27 U/L (ref 11–59)

## 2013-11-10 LAB — COMPREHENSIVE METABOLIC PANEL
ALT: 25 U/L (ref 0–35)
AST: 21 U/L (ref 0–37)
Albumin: 3.9 g/dL (ref 3.5–5.2)
Alkaline Phosphatase: 101 U/L (ref 39–117)
BUN: 15 mg/dL (ref 6–23)
CALCIUM: 9.1 mg/dL (ref 8.4–10.5)
CO2: 25 mEq/L (ref 19–32)
Chloride: 103 mEq/L (ref 96–112)
Creatinine, Ser: 0.82 mg/dL (ref 0.50–1.10)
GFR calc Af Amer: >90 mL/min (ref >90)
GFR calc non Af Amer: >90 mL/min (ref >90)
Glucose, Bld: 90 mg/dL (ref 70–99)
Potassium: 4 mEq/L (ref 3.7–5.3)
Sodium: 141 mEq/L (ref 137–147)
TOTAL PROTEIN: 7.6 g/dL (ref 6.0–8.3)
Total Bilirubin: <0.2 mg/dL — ABNORMAL LOW (ref 0.3–1.2)

## 2013-11-10 LAB — POC URINE PREG, ED: PREG TEST UR: NEGATIVE

## 2013-11-10 NOTE — ED Notes (Signed)
Pt. reports RUQ /RLQ and right back pain with nausea and vomitting onset 3 weeks ago , denies diarrhea /fever or chills. No urinary discomfort or vaginal discharge .

## 2013-11-11 ENCOUNTER — Emergency Department (HOSPITAL_COMMUNITY)
Admission: EM | Admit: 2013-11-11 | Discharge: 2013-11-11 | Disposition: A | Discharge status: Home / Self Care | Payer: Self-pay | Attending: Emergency Medicine | Admitting: Emergency Medicine

## 2013-11-11 ENCOUNTER — Emergency Department (HOSPITAL_COMMUNITY): Payer: Self-pay

## 2013-11-11 DIAGNOSIS — R109 Unspecified abdominal pain: Secondary | ICD-10-CM

## 2013-11-11 MED ORDER — IBUPROFEN 600 MG PO TABS: 600.0000 mg | ORAL_TABLET | Freq: Four times a day (QID) | ORAL | Status: DC | PRN

## 2013-11-11 MED ORDER — PROMETHAZINE HCL 25 MG PO TABS: 25.0000 mg | ORAL_TABLET | Freq: Four times a day (QID) | ORAL | Status: DC | PRN

## 2013-11-11 MED ORDER — ONDANSETRON HCL 4 MG/2ML IJ SOLN
4.0000 mg | Freq: Once | INTRAMUSCULAR | Status: AC
  Administered 2013-11-11: 4 mg via INTRAVENOUS
  Filled 2013-11-11: qty 2

## 2013-11-11 MED ORDER — HYDROMORPHONE HCL PF 1 MG/ML IJ SOLN
1.0000 mg | Freq: Once | INTRAMUSCULAR | Status: AC
  Administered 2013-11-11: 1 mg via INTRAVENOUS
  Filled 2013-11-11: qty 1

## 2013-11-11 MED ORDER — PROMETHAZINE HCL 25 MG/ML IJ SOLN
25.0000 mg | Freq: Four times a day (QID) | INTRAMUSCULAR | Status: DC | PRN
  Filled 2013-11-11: qty 1

## 2013-11-11 MED ORDER — SODIUM CHLORIDE 0.9 % IV BOLUS (SEPSIS)
1000.0000 mL | Freq: Once | INTRAVENOUS | Status: AC
  Administered 2013-11-11: 1000 mL via INTRAVENOUS

## 2013-11-11 NOTE — Discharge Instructions (Signed)
Dolor abdominal en adultos  (Abdominal Pain, Adult)  El dolor puede tener muchas causas. Normalmente la causa del dolor abdominal no es una enfermedad y mejorará sin tratamiento. Frecuentemente puede controlarse y tratarse en casa. Su médico le realizará un examen físico y posiblemente solicite análisis de sangre y radiografías para ayudar a determinar la gravedad de su dolor. Sin embargo, en muchos casos, debe transcurrir más tiempo antes de que se pueda encontrar una causa evidente del dolor. Antes de llegar a ese punto, es posible que su médico no sepa si necesita más pruebas o un tratamiento más profundo.  INSTRUCCIONES PARA EL CUIDADO EN EL HOGAR   Esté atento al dolor para ver si hay cambios. Las siguientes indicaciones ayudarán a aliviar cualquier molestia que pueda sentir:  · Tome solo medicamentos de venta libre o recetados, según las indicaciones del médico.  · No tome laxantes a menos que se lo haya indicado su médico.  · Pruebe con una dieta líquida absoluta (caldo, té o agua) según se lo indique su médico. Introduzca gradualmente una dieta normal, según su tolerancia.  SOLICITE ATENCIÓN MÉDICA SI:  · Tiene dolor abdominal sin explicación.  · Tiene dolor abdominal relacionado con náuseas o diarrea.  · Tiene dolor cuando orina o defeca.  · Experimenta dolor abdominal que lo despierta de noche.  · Tiene dolor abdominal que empeora o mejora cuando come alimentos.  · Tiene dolor abdominal que empeora cuando come alimentos grasosos.  SOLICITE ATENCIÓN MÉDICA DE INMEDIATO SI:   · El dolor no desaparece en un plazo máximo de 2 horas.  · Tiene fiebre.  · No deja de (vomitar).  · El dolor se siente solo en partes del abdomen, como el lado derecho o la parte inferior izquierda del abdomen.  · Evacúa materia fecal sanguinolenta o negra, de aspecto alquitranado.  ASEGÚRESE DE QUE:  · Comprende estas instrucciones.  · Controlará su afección.  · Recibirá ayuda de inmediato si no mejora o si empeora.  Document  Released: 09/03/2005 Document Revised: 06/24/2013  [employer]® Patient Information ©2014 [employer].

## 2013-11-11 NOTE — ED Provider Notes (Signed)
CSN: [id number]     Arrival date & time 11/10/13  2124 History   First MD Initiated Contact with Patient 11/11/13 0211     Chief Complaint  Patient presents with  . Abdominal Pain     (Consider location/radiation/quality/duration/timing/severity/associated sxs/prior Treatment) HPI History provided by patient. She is a limited historian due to to primarily Spanish-speaking but does provide adequate history. She has had about 3 weeks of right flank pain, radiates from right low back to her right side. Tonight pain is worse with associated nausea and vomiting. She denies any diarrhea or constipation. No fevers or chills. No hematuria, dysuria, urgency or frequency. No associated vaginal bleeding or discharge. Pain sharp in quality and moderate to severe at times. No known alleviating factors. She denies history of similar symptoms in the past. Past Medical History  Diagnosis Date  . Diverticulitis    Past Surgical History  Procedure Laterality Date  . Cholecystectomy    . Cesarean section     No family history on file. History  Substance Use Topics  . Smoking status: Never Smoker   . Smokeless tobacco: Never Used  . Alcohol Use: No   OB History   Grav Para Term Preterm Abortions TAB SAB Ect Mult Living   4 2 2  2  2   2      Review of Systems  Constitutional: Negative for fever and chills.  Respiratory: Negative for shortness of breath.   Cardiovascular: Negative for chest pain.  Gastrointestinal: Positive for nausea and vomiting. Negative for blood in stool.  Genitourinary: Positive for flank pain.  Musculoskeletal: Negative for neck pain.  Skin: Negative for rash.  Neurological: Negative for headaches.  All other systems reviewed and are negative.      Allergies  Review of patient's allergies indicates no known allergies.  Home Medications  No current outpatient prescriptions on file. BP 109/54  Pulse 92  Temp(Src) 98 F (36.7 C) (Oral)  Resp 14  Ht 4\' 11"   (1.499 m)  Wt 193 lb (87.544 kg)  BMI 38.96 kg/m2  SpO2 99% Physical Exam  Constitutional: She is oriented to person, place, and time. She appears well-developed and well-nourished.  HENT:  Head: Normocephalic and atraumatic.  Eyes: EOM are normal. Pupils are equal, round, and reactive to light. No scleral icterus.  Neck: Neck supple.  Cardiovascular: Normal rate, regular rhythm and intact distal pulses.   Pulmonary/Chest: Effort normal and breath sounds normal. No respiratory distress.  Abdominal: Soft. Bowel sounds are normal. She exhibits no distension. There is no rebound and no guarding.  No CVA tenderness. Localizes discomfort to right flank without reproducible tenderness  Musculoskeletal: Normal range of motion. She exhibits no edema.  Neurological: She is alert and oriented to person, place, and time.  Skin: Skin is warm and dry.    ED Course  Procedures (including critical care time) Labs Review Labs Reviewed  COMPREHENSIVE METABOLIC PANEL - Abnormal; Notable for the following:    Total Bilirubin <0.2 (*)    All other components within normal limits  URINALYSIS, ROUTINE W REFLEX MICROSCOPIC - Abnormal; Notable for the following:    APPearance CLOUDY (*)    All other components within normal limits  CBC WITH DIFFERENTIAL  LIPASE, BLOOD  POC URINE PREG, ED   Imaging Review Ct Abdomen Pelvis Wo Contrast  11/11/2013   CLINICAL DATA:  28 year old female with right abdominal and pelvic pain, nausea and vomiting.  EXAM: CT ABDOMEN AND PELVIS WITHOUT CONTRAST  TECHNIQUE: Multidetector  CT imaging of the abdomen and pelvis was performed following the standard protocol without intravenous contrast.  COMPARISON:  08/29/2013 and prior CTs  FINDINGS: Fatty infiltration of the liver is identified.  The spleen, pancreas, adrenal glands and kidneys are unremarkable.  Please note that parenchymal abnormalities may be missed without intravenous contrast.  The patient is status post  cholecystectomy.  There is no evidence of free fluid, enlarged lymph nodes, biliary dilation or abdominal aortic aneurysm.  The bowel, bladder and appendix are unremarkable. There is no evidence of bowel obstruction, abscess or pneumoperitoneum.  An IUD is present.  No acute or suspicious bony abnormalities are present.  IMPRESSION: No acute abnormalities.  Normal appendix.  Fatty infiltration of the liver.   Electronically Signed   By: Hassan Rowan M.D.   On: 11/11/2013 03:09   IV fluids. IV Dilaudid. IV Zofran. CT scan reviewed as above with patient, pain improved and nausea resolved  3:33 AM is tolerating by mouth fluids. Patient feels comfortable the plan discharge home and outpatient followup. Abdominal pain precautions provided. Prescription for Phenergan and Motrin as needed  MDM   Diagnosis: Abdominal pain, flank pain  Evaluated with urinalysis, labs and CT scan reviewed as above - no ureterolithiasis, normal appendix Improved with IV fluids and medications including IV narcotics Vital signs and nursing notes reviewed and considered    Teressa Lower, MD 11/11/13 [id number]

## 2013-12-20 ENCOUNTER — Emergency Department (HOSPITAL_COMMUNITY): Payer: Self-pay

## 2013-12-20 ENCOUNTER — Emergency Department (HOSPITAL_COMMUNITY)
Admission: EM | Admit: 2013-12-20 | Discharge: 2013-12-20 | Disposition: A | Discharge status: Home / Self Care | Payer: Self-pay | Attending: Emergency Medicine | Admitting: Emergency Medicine

## 2013-12-20 ENCOUNTER — Encounter (HOSPITAL_COMMUNITY): Payer: Self-pay | Admitting: Emergency Medicine

## 2013-12-20 DIAGNOSIS — N83209 Unspecified ovarian cyst, unspecified side: Secondary | ICD-10-CM | POA: Insufficient documentation

## 2013-12-20 DIAGNOSIS — R42 Dizziness and giddiness: Secondary | ICD-10-CM | POA: Insufficient documentation

## 2013-12-20 DIAGNOSIS — Z9889 Other specified postprocedural states: Secondary | ICD-10-CM | POA: Insufficient documentation

## 2013-12-20 DIAGNOSIS — Z8719 Personal history of other diseases of the digestive system: Secondary | ICD-10-CM | POA: Insufficient documentation

## 2013-12-20 DIAGNOSIS — N390 Urinary tract infection, site not specified: Secondary | ICD-10-CM | POA: Insufficient documentation

## 2013-12-20 DIAGNOSIS — Z3202 Encounter for pregnancy test, result negative: Secondary | ICD-10-CM | POA: Insufficient documentation

## 2013-12-20 DIAGNOSIS — Z9089 Acquired absence of other organs: Secondary | ICD-10-CM | POA: Insufficient documentation

## 2013-12-20 LAB — COMPREHENSIVE METABOLIC PANEL
ALK PHOS: 93 U/L (ref 39–117)
ALT: 36 U/L — ABNORMAL HIGH (ref 0–35)
AST: 27 U/L (ref 0–37)
Albumin: 3.8 g/dL (ref 3.5–5.2)
BUN: 13 mg/dL (ref 6–23)
CALCIUM: 9.4 mg/dL (ref 8.4–10.5)
CO2: 26 meq/L (ref 19–32)
Chloride: 103 mEq/L (ref 96–112)
Creatinine, Ser: 0.71 mg/dL (ref 0.50–1.10)
GLUCOSE: 64 mg/dL — AB (ref 70–99)
Potassium: 3.6 mEq/L — ABNORMAL LOW (ref 3.7–5.3)
SODIUM: 142 meq/L (ref 137–147)
Total Bilirubin: 0.2 mg/dL — ABNORMAL LOW (ref 0.3–1.2)
Total Protein: 7.3 g/dL (ref 6.0–8.3)

## 2013-12-20 LAB — CBC WITH DIFFERENTIAL/PLATELET
Basophils Absolute: 0 10*3/uL (ref 0.0–0.1)
Basophils Relative: 0 % (ref 0–1)
EOS PCT: 3 % (ref 0–5)
Eosinophils Absolute: 0.3 10*3/uL (ref 0.0–0.7)
HCT: 37.7 % (ref 36.0–46.0)
Hemoglobin: 12.9 g/dL (ref 12.0–15.0)
LYMPHS ABS: 3.4 10*3/uL (ref 0.7–4.0)
LYMPHS PCT: 39 % (ref 12–46)
MCH: 30.9 pg (ref 26.0–34.0)
MCHC: 34.2 g/dL (ref 30.0–36.0)
MCV: 90.2 fL (ref 78.0–100.0)
MONOS PCT: 6 % (ref 3–12)
Monocytes Absolute: 0.5 10*3/uL (ref 0.1–1.0)
Neutro Abs: 4.5 10*3/uL (ref 1.7–7.7)
Neutrophils Relative %: 52 % (ref 43–77)
PLATELETS: 261 10*3/uL (ref 150–400)
RBC: 4.18 MIL/uL (ref 3.87–5.11)
RDW: 12.2 % (ref 11.5–15.5)
WBC: 8.7 10*3/uL (ref 4.0–10.5)

## 2013-12-20 LAB — URINALYSIS, ROUTINE W REFLEX MICROSCOPIC
Bilirubin Urine: NEGATIVE
Glucose, UA: NEGATIVE mg/dL
Hgb urine dipstick: NEGATIVE
KETONES UR: NEGATIVE mg/dL
NITRITE: POSITIVE — AB
Protein, ur: NEGATIVE mg/dL
Specific Gravity, Urine: 1.014 (ref 1.005–1.030)
UROBILINOGEN UA: 0.2 mg/dL (ref 0.0–1.0)
pH: 7 (ref 5.0–8.0)

## 2013-12-20 LAB — PREGNANCY, URINE: PREG TEST UR: NEGATIVE

## 2013-12-20 LAB — URINE MICROSCOPIC-ADD ON

## 2013-12-20 LAB — WET PREP, GENITAL
Clue Cells Wet Prep HPF POC: NONE SEEN
Trich, Wet Prep: NONE SEEN
WBC, Wet Prep HPF POC: NONE SEEN
Yeast Wet Prep HPF POC: NONE SEEN

## 2013-12-20 MED ORDER — ONDANSETRON HCL 4 MG PO TABS: 4.0000 mg | ORAL_TABLET | Freq: Four times a day (QID) | ORAL | Status: DC

## 2013-12-20 MED ORDER — KETOROLAC TROMETHAMINE 30 MG/ML IJ SOLN
30.0000 mg | Freq: Once | INTRAMUSCULAR | Status: AC
  Administered 2013-12-20: 30 mg via INTRAVENOUS
  Filled 2013-12-20: qty 1

## 2013-12-20 MED ORDER — ONDANSETRON HCL 4 MG/2ML IJ SOLN
4.0000 mg | Freq: Once | INTRAMUSCULAR | Status: AC
  Administered 2013-12-20: 4 mg via INTRAVENOUS
  Filled 2013-12-20: qty 2

## 2013-12-20 MED ORDER — SODIUM CHLORIDE 0.9 % IV BOLUS (SEPSIS)
1000.0000 mL | Freq: Once | INTRAVENOUS | Status: AC
  Administered 2013-12-20: 1000 mL via INTRAVENOUS

## 2013-12-20 MED ORDER — CEPHALEXIN 500 MG PO CAPS: 500.0000 mg | ORAL_CAPSULE | Freq: Two times a day (BID) | ORAL | Status: DC

## 2013-12-20 MED ORDER — IBUPROFEN 600 MG PO TABS: 600.0000 mg | ORAL_TABLET | Freq: Four times a day (QID) | ORAL | Status: DC | PRN

## 2013-12-20 MED ORDER — DEXTROSE 5 % IV SOLN
1.0000 g | Freq: Once | INTRAVENOUS | Status: AC
  Administered 2013-12-20: 1 g via INTRAVENOUS
  Filled 2013-12-20: qty 10

## 2013-12-20 NOTE — ED Notes (Signed)
Language barrier.  Patient speaks little to no english.

## 2013-12-20 NOTE — ED Provider Notes (Signed)
TIME SEEN: 3:55 PM  CHIEF COMPLAINT: Lower abdominal pain, nausea, dizziness  HPI: Patient is a Spanish-speaking only female with history of diverticulitis, status post cholecystectomy and 2 C-sections who presents emergency department with "pain in my womb" for one week. She's had associated nausea and dizziness. Denies any fevers, chills, vomiting or diarrhea, bloody stools or melena, and dysuria or hematuria, urinary frequency urgency, vaginal bleeding or discharge. She had a Mirena IUD removed on 3/17 and that was the beginning of her last normal menstrual period.  ROS: See HPI Constitutional: no fever  Eyes: no drainage  ENT: no runny nose   Cardiovascular:  no chest pain  Resp: no SOB  GI: no vomiting GU: no dysuria Integumentary: no rash  Allergy: no hives  Musculoskeletal: no leg swelling  Neurological: no slurred speech ROS otherwise negative  PAST MEDICAL HISTORY/PAST SURGICAL HISTORY:  Past Medical History  Diagnosis Date  . Diverticulitis     MEDICATIONS:  Prior to Admission medications   Not on File    ALLERGIES:  No Known Allergies  SOCIAL HISTORY:  History  Substance Use Topics  . Smoking status: Never Smoker   . Smokeless tobacco: Never Used  . Alcohol Use: No    FAMILY HISTORY: No family history on file.  EXAM: BP 130/67  Pulse 81  Temp(Src) 98.1 F (36.7 C) (Oral)  Resp 20  Ht 4\' 11"  (1.499 m)  Wt 193 lb 9.6 oz (87.816 kg)  BMI 39.08 kg/m2  SpO2 100% CONSTITUTIONAL: Alert and oriented and responds appropriately to questions. Well-appearing; well-nourished HEAD: Normocephalic EYES: Conjunctivae clear, PERRL ENT: normal nose; no rhinorrhea; moist mucous membranes; pharynx without lesions noted NECK: Supple, no meningismus, no LAD  CARD: RRR; S1 and S2 appreciated; no murmurs, no clicks, no rubs, no gallops RESP: Normal chest excursion without splinting or tachypnea; breath sounds clear and equal bilaterally; no wheezes, no rhonchi, no  rales,  ABD/GI: Normal bowel sounds; non-distended; soft, mildly tender to palpation in the pelvic region bilaterally and suprapubically, no guarding or rebound, no peritoneal signs BACK:  The back appears normal and is non-tender to palpation, there is no CVA tenderness EXT: Normal ROM in all joints; non-tender to palpation; no edema; normal capillary refill; no cyanosis    SKIN: Normal color for age and race; warm NEURO: Moves all extremities equally PSYCH: The patient's mood and manner are appropriate. Grooming and personal hygiene are appropriate.  MEDICAL DECISION MAKING: This here with lower abdominal pain. Concern for torsion versus TOA versus PID versus UTI versus ectopic pregnancy. We'll obtain labs, urine, pelvic with cultures and transvaginal ultrasound with Doppler. We'll give IV fluids, antiemetics and pain medication.  ED PROGRESS: Patient's labs are unremarkable. Her urine shows a nitrite positive urinary tract infection. Urine culture pending. We'll give a dose of IV ceftriaxone.   8:41 PM  Pt is feeling much better after 2 L of IV fluids. Her wet prep is negative. Pelvic exam is unremarkable. Transvaginal ultrasound shows 2.4 cm right-sided ovarian cyst is unchanged compared to her prior ultrasound of 2014. There is normal flow to bilateral ovaries. Have discussed with patient that I feel she is safe to be discharged home. We'll discharge with prescription for Keflex, ibuprofen and Zofran. Have given return precautions. Patient verbalizes understanding is comfortable plan.  [place], DO 12/20/13 2043

## 2013-12-20 NOTE — ED Notes (Signed)
Pt c/o generalized abdominal pain x 1 week with nausea.

## 2013-12-20 NOTE — Discharge Instructions (Signed)
Quiste ovrico (Ovarian Cyst) Un quiste ovrico es una bolsa llena de lquido que se forma en el ovario. Los ovarios son los rganos pequeos que producen vulos en las mujeres. Se pueden formar varios tipos de [employer]. Benito Mccreedy no son cancerosos. Muchos de ellos no causan problemas y con frecuencia desaparecen solos. Algunos pueden provocar sntomas y requerir Clinical research associate. Los tipos ms comunes de quistes ovricos son los siguientes:  Quistes funcionales: estos quistes pueden aparecer todos los meses durante el ciclo menstrual. Esto es normal. Estos quistes suelen desaparecer con el prximo ciclo menstrual si la mujer no queda embarazada. En general, los quistes funcionales no tienen sntomas.  Endometriomas: estos quistes se forman a partir del tejido que recubre el tero. Tambin se denominan "quistes de chocolate" porque se llenan de sangre que se vuelve marrn. Este tipo de quiste puede Engineer, production en la zona inferior del abdomen durante la relacin sexual y con el perodo menstrual.  Cistoadenomas: este tipo se desarrolla a partir de las clulas que se [place] en el exterior del ovario. Estos quistes pueden ser muy grandes y causar dolor en la zona inferior del abdomen y durante la relacin sexual. Cindra Presume tipo de quiste puede girar sobre s mismo, cortar el suministro de Biochemist, clinical y causar un dolor intenso. Tambin se puede romper con facilidad y Stage manager.  Quistes dermoides: este tipo de quiste a veces se encuentra en ambos ovarios. Estos quistes pueden [employer] tipos de tejidos del organismo, como piel, dientes, pelo o Database administrator. Generalmente no tienen sntomas, a menos que sean muy grandes.  Quistes tecalutenicos: aparecen cuando se produce demasiada cantidad de cierta hormona (gonadotropina corinica humana) que estimula en exceso al ovario para que produzca vulos. Esto es ms frecuente despus de procedimientos que ayudan a la concepcin de un beb  (fertilizacin in vitro). CAUSAS   Los medicamentos para la fertilidad pueden provocar una afeccin mediante la cual se forman mltiples quistes de gran tamao en los ovarios. Esta se denomina sndrome de hiperestimulacin ovrica.  El sndrome del ovario poliqustico es una afeccin que puede causar desequilibrios hormonales, los cuales pueden dar como resultado quistes ovricos no funcionales. SIGNOS Y SNTOMAS  Muchos quistes ovricos no causan sntomas. Si se presentan sntomas, stos pueden ser:  Dolor o molestias en la pelvis.  Dolor en la parte baja del abdomen.  [place].  Aumento del permetro abdominal (hinchazn).  Perodos menstruales anormales.  Aumento del [employer] perodos [place].  Cese de los perodos menstruales sin estar embarazada. DIAGNSTICO  Estos quistes se descubren comnmente durante un examen de rutina o una exploracin ginecolgica anual. Es posible que se ordenen otros estudios para obtener ms informacin sobre el [place]. Estos estudios pueden ser:  Engineer, materials.  Radiografas de la pelvis.  Tomografa computada.  Resonancia magntica.  Anlis[place] de [place]. TRATAMIENTO  Muchos de los quistes ovricos desaparecen por s solos, sin tratamiento. Es probable que el mdico quiera controlar el quiste regularmente durante 2 o 28mses para ver si se produce algn cambio. En el caso de las mujeres en la menopausia, es particularmente importante controlar de cerca al quiste ya que el ndice de cncer de ovario en las mujeres menopusicas es ms alto. Cuando se requiere tClinical research associate este puede incluir cualquiera de los siguientes:  Un procedimiento para drenar el quiste (aspiracin). Esto se puede realizar mFamily Dollar Storesuso de uGuamgrande y uArgyle Tambin se puede hacer a travs de un procedimiento laparoscpico, En  este procedimiento, se inserta un tubo delgado que emite luz y que tiene una pequea cmara en un  extremo (laparoscopio) a travs de una pequea incisin.  Ciruga para extirpar el quiste completo. Esto se puede realizar mediante una ciruga laparoscpica o Ardelia Mems ciruga abierta, la cual implica realizar una incisin ms grande en la parte inferior del abdomen.  Tratamiento hormonal o pldoras anticonceptivas. Estos mtodos a veces se usan para ayudar a Writer. Bert[place] solo medicamentos de venta libre o recetados, segn las indicaciones del mdico.  Consulting civil engineer a las consultas de control con su mdico segn las indicaciones.  Hgase exmenes plvicos regulares y pruebas de [place]apan[place]ola[place]. SOLICITE ATENCIN MDICA SI:   Los perodos se atrasan, son irregulares, dolorosos o cesan.  El dolor plvico o abdominal no desaparece.  El abdomen se agranda o se hincha.  Siente presin en la vejiga o no puede vaciarla completamente.  Siente dolor durante las [employer].  Tiene una sensacin de hinchazn, presin o Manufacturing systems engineer.  Pierde peso sin razn aparente.  Siente un Pharmacist, hospital.  Est estreida.  Pierde el apetito.  Le aparece acn.  Nota un aumento del vello corporal y facial.  Elenore Rota de peso sin hacer modificaciones en su actividad fsica y en su dieta habitual.  Sospecha que est embarazada. SOLICITE ATENCIN MDICA DE INMEDIATO SI:   Siente cada vez ms dolor abdominal.  Tiene malestar estomacal (nuseas) y vomita.  Tiene fiebre que se presenta de [place] repentina.  Siente dolor abdominal al defecar.  Sus perodos menstruales son ms abundantes que lo habitual. Document Released: 06/13/2005 Document Revised: 06/24/2013 ExitCare Patient Information 2014 [place].  Infeccin urinaria  (Urinary Tract Infection)  La infeccin urinaria puede ocurrir en Clinical cytogeneticist del tracto urinario. El tracto urinario es un sistema de drenaje del cuerpo por el que se eliminan los desechos y  el exceso de [place]. El tracto urinario est formado por dos riones, dos urteres, la vejiga y Geologist, engineering. Los riones son rganos que tienen forma de frijol. Cada rin tiene aproximadamente el tamao del puo. Estn situados debajo de las [place], uno a cada lado de la columna vertebral CAUSAS  La causa de la infeccin son los microbios, que son organismos microscpicos, que incluyen hongos, virus, y bacterias. Estos organismos son tan pequeos que slo pueden verse a travs del microscopio. Las bacterias son los microorganismos que ms comnmente causan infecciones urinarias.  SNTOMAS  Los sntomas pueden variar segn la edad y el sexo del paciente y por la ubicacin de la infeccin. Los sntomas en las mujeres jvenes incluyen la necesidad frecuente e intensa de orinar y una sensacin dolorosa de ardor en la vejiga o en la uretra durante la miccin. Las mujeres y los hombres mayores podrn sentir cansancio, temblores y debilidad y Arts development officer musculares y Social research officer, government abdominal. Si tiene [place], puede significar que la infeccin est en los riones. Otros sntomas son dolor en la espalda o en los lados debajo de las [place], nuseas y vmitos.  DIAGNSTICO  Para diagnosticar una infeccin urinaria, el mdico le preguntar acerca de sus sntomas. [employer] una [place]. La muestra de orina se analiza para Hydrographic surveyor bacterias y glbulos blancos de Herbalist. Los glbulos blancos se forman en el organismo para ayudar a Radio broadcast assistant las infecciones.  TRATAMIENTO  Por lo general, las infecciones urinarias pueden tratarse con medicamentos. Debido a que la [place] de las infecciones son  causadas por bacterias, por lo general pueden tratarse con antibiticos. La eleccin del antibitico y la duracin del tratamiento depender de sus sntomas y el tipo de bacteria causante de la infeccin.  INSTRUCCIONES PARA EL CUIDADO EN EL HOGAR   Si le recetaron antibiticos, tmelos exactamente como su  mdico le indique. Termine el medicamento aunque se sienta mejor despus de haber tomado slo algunos.  Beba gran cantidad de lquido para mantener la orina de tono claro o color amarillo plido.  Evite la cafena, el t y las bebidas gaseosas. Estas sustancias irritan la vejiga.  Vaciar la vejiga con frecuencia. Evite retener la orina durante largos perodos.  Vace la vejiga antes y despus de Clinical biochemist.  Despus de mover el intestino, las mujeres deben higienizarse la regin perineal desde adelante hacia atrs. Use slo un papel tissue por vez. SOLICITE ATENCIN MDICA SI:   Siente dolor en la espalda.  Le sube la fiebre.  Los sntomas no mejoran luego de 3 das. SOLICITE ATENCIN MDICA DE INMEDIATO SI:   Siente dolor intenso en la espalda o en la zona inferior del abdomen.  Comienza a sentir escalofros.  Tiene nuseas o vmitos.  Tiene una sensacin continua de quemazn o molestias al [employer]. ASEGRESE DE QUE:   Comprende estas instrucciones.  Controlar su enfermedad.  Solicitar ayuda de inmediato si no mejora o empeora. Document Released: 06/13/2005 Document Revised: 05/28/2012 [facility] Patient Information 2014 [place], [place].

## 2013-12-21 LAB — GC/CHLAMYDIA PROBE AMP
CT Probe RNA: NEGATIVE
GC PROBE AMP APTIMA: NEGATIVE

## 2013-12-23 LAB — URINE CULTURE: Colony Count: >=100000

## 2013-12-26 ENCOUNTER — Encounter (HOSPITAL_COMMUNITY): Payer: Self-pay | Admitting: Family

## 2013-12-26 ENCOUNTER — Inpatient Hospital Stay (HOSPITAL_COMMUNITY): Payer: Self-pay

## 2013-12-26 ENCOUNTER — Inpatient Hospital Stay (HOSPITAL_COMMUNITY)
Admission: AD | Admit: 2013-12-26 | Discharge: 2013-12-26 | Disposition: A | Discharge status: Home / Self Care | Payer: Self-pay | Source: Ambulatory Visit | Attending: Obstetrics & Gynecology | Admitting: Obstetrics & Gynecology

## 2013-12-26 DIAGNOSIS — R109 Unspecified abdominal pain: Secondary | ICD-10-CM | POA: Insufficient documentation

## 2013-12-26 DIAGNOSIS — R112 Nausea with vomiting, unspecified: Secondary | ICD-10-CM

## 2013-12-26 DIAGNOSIS — N83209 Unspecified ovarian cyst, unspecified side: Secondary | ICD-10-CM | POA: Insufficient documentation

## 2013-12-26 LAB — URINALYSIS, ROUTINE W REFLEX MICROSCOPIC
BILIRUBIN URINE: NEGATIVE
Glucose, UA: NEGATIVE mg/dL
Hgb urine dipstick: NEGATIVE
Ketones, ur: NEGATIVE mg/dL
Leukocytes, UA: NEGATIVE
NITRITE: NEGATIVE
Protein, ur: NEGATIVE mg/dL
Specific Gravity, Urine: >1.03 — ABNORMAL HIGH (ref 1.005–1.030)
Urobilinogen, UA: 0.2 mg/dL (ref 0.0–1.0)
pH: 6 (ref 5.0–8.0)

## 2013-12-26 LAB — CBC WITH DIFFERENTIAL/PLATELET
BASOS PCT: 1 % (ref 0–1)
Basophils Absolute: 0.1 10*3/uL (ref 0.0–0.1)
EOS ABS: 0.4 10*3/uL (ref 0.0–0.7)
Eosinophils Relative: 5 % (ref 0–5)
HCT: 37.4 % (ref 36.0–46.0)
Hemoglobin: 12.9 g/dL (ref 12.0–15.0)
LYMPHS ABS: 3.4 10*3/uL (ref 0.7–4.0)
Lymphocytes Relative: 37 % (ref 12–46)
MCH: 31.5 pg (ref 26.0–34.0)
MCHC: 34.5 g/dL (ref 30.0–36.0)
MCV: 91.2 fL (ref 78.0–100.0)
Monocytes Absolute: 0.5 10*3/uL (ref 0.1–1.0)
Monocytes Relative: 6 % (ref 3–12)
Neutro Abs: 4.8 10*3/uL (ref 1.7–7.7)
Neutrophils Relative %: 52 % (ref 43–77)
Platelets: 251 10*3/uL (ref 150–400)
RBC: 4.1 MIL/uL (ref 3.87–5.11)
RDW: 12.2 % (ref 11.5–15.5)
WBC: 9.2 10*3/uL (ref 4.0–10.5)

## 2013-12-26 LAB — COMPREHENSIVE METABOLIC PANEL
ALBUMIN: 3.9 g/dL (ref 3.5–5.2)
ALK PHOS: 102 U/L (ref 39–117)
ALT: 41 U/L — AB (ref 0–35)
AST: 30 U/L (ref 0–37)
BUN: 21 mg/dL (ref 6–23)
CO2: 25 mEq/L (ref 19–32)
Calcium: 9.3 mg/dL (ref 8.4–10.5)
Chloride: 102 mEq/L (ref 96–112)
Creatinine, Ser: 0.78 mg/dL (ref 0.50–1.10)
GFR calc Af Amer: >90 mL/min (ref >90)
GFR calc non Af Amer: >90 mL/min (ref >90)
GLUCOSE: 99 mg/dL (ref 70–99)
POTASSIUM: 4.3 meq/L (ref 3.7–5.3)
SODIUM: 139 meq/L (ref 137–147)
TOTAL PROTEIN: 7 g/dL (ref 6.0–8.3)
Total Bilirubin: <0.2 mg/dL — ABNORMAL LOW (ref 0.3–1.2)

## 2013-12-26 LAB — HCG, QUANTITATIVE, PREGNANCY: hCG, Beta Chain, Quant, S: <1 m[IU]/mL (ref <5)

## 2013-12-26 MED ORDER — KETOROLAC TROMETHAMINE 60 MG/2ML IM SOLN
60.0000 mg | Freq: Once | INTRAMUSCULAR | Status: AC
  Administered 2013-12-26: 60 mg via INTRAMUSCULAR
  Filled 2013-12-26: qty 2

## 2013-12-26 MED ORDER — PROMETHAZINE HCL 25 MG/ML IJ SOLN
25.0000 mg | Freq: Once | INTRAMUSCULAR | Status: AC
  Administered 2013-12-26: 25 mg via INTRAMUSCULAR
  Filled 2013-12-26: qty 1

## 2013-12-26 MED ORDER — PROMETHAZINE HCL 12.5 MG PO TABS: 25.0000 mg | ORAL_TABLET | Freq: Four times a day (QID) | ORAL | Status: DC | PRN

## 2013-12-26 NOTE — MAU Note (Addendum)
27yo, G4P2, presents to MAU with c/o constant bilateral lower abdominal pain x 2 weeks; Reports N/V x 3 days; no diarrhea, some light-headedness. Reports fever, chills. Hx of diverticulitis a couple of years ago.  Denies pyuria, dysuria, vaginal changes. Reports some dyspareunia. Hx of cholecystomy in 2011. Denies hx of DM.

## 2013-12-26 NOTE — MAU Provider Note (Signed)
History     CSN: [id number]  Arrival date and time: 12/26/13 2505   First Provider Initiated Contact with Patient 12/26/13 0915      Chief Complaint  Patient presents with  . Abdominal Pain   HPI Lorraine [name] 28 y.o. Client comes to MAU today with persistent nausea and vomiting with diffuse lower abdominal pain.  History of IUD removed on 12-01-13.  Had bleeding for 6 days after removal and has not had a period since.  Has history of hospitalization for acute diverticulitis in 08-2012 and has had persistent lower abdominal pain since that time.  Has had CT scans on 09-09-12, 12-14-12, 02-06-13, 08-29-13, 11-11-12.  None have shown diverticulitis or any problems with the appendix.  Also has had ultrasounds on 08-29-13 and 12-20-13.  Was treated for UTI on 12-20-13 and given keflex to take which she has completed.  Is worried that she is pregnant.  Had positive and then negative pregnancy tests at home.    OB History   Grav Para Term Preterm Abortions TAB SAB Ect Mult Living   4 2 2  2  2   2       Past Medical History  Diagnosis Date  . Diverticulitis     Past Surgical History  Procedure Laterality Date  . Cholecystectomy    . Cesarean section      History reviewed. No pertinent family history.  History  Substance Use Topics  . Smoking status: Never Smoker   . Smokeless tobacco: Never Used  . Alcohol Use: No    Allergies: No Known Allergies  No prescriptions prior to admission    Review of Systems  Constitutional: Negative for fever.  Gastrointestinal: Positive for nausea, vomiting and abdominal pain. Negative for diarrhea and constipation.  Genitourinary:       No vaginal discharge. No vaginal bleeding. No dysuria.   Physical Exam   Blood pressure 108/61, temperature 98 F (36.7 C), temperature source Oral, resp. rate 16, height 4\' 11"  (1.499 m), weight 193 lb (87.544 kg), last menstrual period 12/01/2013.  Physical Exam  Nursing note and vitals  reviewed. Constitutional: She is oriented to person, place, and time. She appears well-developed and well-nourished.  Is very quiet and seemingly stoic.  After exam, began to brighten and ask several questions.  HENT:  Head: Normocephalic.  Eyes: EOM are normal.  Neck: Neck supple.  GI: Soft. Bowel sounds are normal. There is tenderness. There is no rebound and no guarding.  Genitourinary:  Speculum exam: Vagina - Small amount of creamy discharge, no odor Cervix - No contact bleeding Bimanual exam: Cervix closed Uterus tender, normal size Adnexa - Tender on right, much less tender on left, no masses bilaterally Tender over uterus in just above pubic symphysis GC/Chlam, wet prep done Chaperone present for exam.  Musculoskeletal: Normal range of motion.  Neurological: She is alert and oriented to person, place, and time.  Skin: Skin is warm and dry.  Psychiatric: She has a normal mood and affect.    MAU Course  Procedures Results for orders placed during the hospital encounter of 12/26/13 (from the past 24 hour(s))  URINALYSIS, ROUTINE W REFLEX MICROSCOPIC     Status: Abnormal   Collection Time    12/26/13  8:41 AM      Result Value Ref Range   Color, Urine YELLOW  YELLOW   APPearance CLEAR  CLEAR   Specific Gravity, Urine >1.030 (*) 1.005 - 1.030   pH 6.0  5.0 - 8.0  Glucose, UA NEGATIVE  NEGATIVE mg/dL   Hgb urine dipstick NEGATIVE  NEGATIVE   Bilirubin Urine NEGATIVE  NEGATIVE   Ketones, ur NEGATIVE  NEGATIVE mg/dL   Protein, ur NEGATIVE  NEGATIVE mg/dL   Urobilinogen, UA 0.2  0.0 - 1.0 mg/dL   Nitrite NEGATIVE  NEGATIVE   Leukocytes, UA NEGATIVE  NEGATIVE  CBC WITH DIFFERENTIAL     Status: None   Collection Time    12/26/13  9:48 AM      Result Value Ref Range   WBC 9.2  4.0 - 10.5 K/uL   RBC 4.10  3.87 - 5.11 MIL/uL   Hemoglobin 12.9  12.0 - 15.0 g/dL   HCT 37.4  36.0 - 46.0 %   MCV 91.2  78.0 - 100.0 fL   MCH 31.5  26.0 - 34.0 pg   MCHC 34.5  30.0 - 36.0  g/dL   RDW 12.2  11.5 - 15.5 %   Platelets 251  150 - 400 K/uL   Neutrophils Relative % 52  43 - 77 %   Neutro Abs 4.8  1.7 - 7.7 K/uL   Lymphocytes Relative 37  12 - 46 %   Lymphs Abs 3.4  0.7 - 4.0 K/uL   Monocytes Relative 6  3 - 12 %   Monocytes Absolute 0.5  0.1 - 1.0 K/uL   Eosinophils Relative 5  0 - 5 %   Eosinophils Absolute 0.4  0.0 - 0.7 K/uL   Basophils Relative 1  0 - 1 %   Basophils Absolute 0.1  0.0 - 0.1 K/uL  COMPREHENSIVE METABOLIC PANEL     Status: Abnormal   Collection Time    12/26/13  9:48 AM      Result Value Ref Range   Sodium 139  137 - 147 mEq/L   Potassium 4.3  3.7 - 5.3 mEq/L   Chloride 102  96 - 112 mEq/L   CO2 25  19 - 32 mEq/L   Glucose, Bld 99  70 - 99 mg/dL   BUN 21  6 - 23 mg/dL   Creatinine, Ser 0.78  0.50 - 1.10 mg/dL   Calcium 9.3  8.4 - 10.5 mg/dL   Total Protein 7.0  6.0 - 8.3 g/dL   Albumin 3.9  3.5 - 5.2 g/dL   AST 30  0 - 37 U/L   ALT 41 (*) 0 - 35 U/L   Alkaline Phosphatase 102  39 - 117 U/L   Total Bilirubin <0.2 (*) 0.3 - 1.2 mg/dL   GFR calc non Af Amer >90  >90 mL/min   GFR calc Af Amer >90  >90 mL/min  HCG, QUANTITATIVE, PREGNANCY     Status: None   Collection Time    12/26/13  9:48 AM      Result Value Ref Range   hCG, Beta Chain, Quant, S <1  <5 mIU/mL    MDM CLINICAL DATA: Lower abdominal pain.  EXAM:  TRANSVAGINAL ULTRASOUND OF PELVIS  TECHNIQUE:  Transvaginal ultrasound examination of the pelvis was performed  including evaluation of the uterus, ovaries, adnexal regions, and  pelvic cul-de-sac.  COMPARISON: 12/20/2013  FINDINGS:  Uterus  Measurements: 8.4 cm x 3.9 cm x 5.1 cm. No fibroids or other mass  visualized.  Endometrium  Thickness: 9.6 mm. No focal abnormality visualized.  Right ovary  Measurements: 4 cm x 3 cm x 2.8 cm. There are prominent physiologic  cysts. There are foci of increased echogenicity in shadowing  adjacent to  the right ovary likely gas within bowel. No adnexal  masses.  Left ovary   Measurements: 3.5 cm x 1.7 cm x 1.8 cm. Normal appearance/no adnexal  mass.  Other findings: No free fluid  IMPRESSION:  Normal transvaginal pelvic ultrasound. No findings to explain lower  abdominal pain.   1245  Reports pain is continuing and colicky.  States pain is now 3/10 and was 6/10 when she arrived.  Has had Toradol 60 mg IM and Phenergan 25 mg IM in MAU.  States she still has some nausea.  States she has used all the medication for nausea given to her at the ER.  Assessment and Plan  Abdominal pain Nausea and vomiting  Plan Make an appointment at [facility] as you need to establish with a medical provider who can see you for continued care. No infection is found today.  No problem is found today.   Would advise not becoming pregnant at this time until a solution can be found for your problems. Drink at least 8 8-oz glasses of water every day. May take over the counter Ibuprofen for pain. Rx phenergan 12.5 mg one PO q 6 hour PRN for nausea.   Worthington Cruzan L Cornelious Diven 12/26/2013, 9:49 AM

## 2013-12-26 NOTE — Discharge Instructions (Signed)
Make an appointment at [facility] as you need to establish with a medical provider who can see you for continued care. No infection is found today.  No problem is found today.   Would advise not becoming pregnant at this time until a solution can be found for your problems. Drink at least 8 8-oz glasses of water every day. May take over the counter Ibuprofen for pain. You have a small ovarian cyst which is normal when women are ovulating.  It is not a problem and may explain some of the pain on your right side.

## 2013-12-26 NOTE — MAU Provider Note (Signed)
Attestation of Attending Supervision of Advanced Practitioner (PA/CNM/NP): Evaluation and management procedures were performed by the Advanced Practitioner under my supervision and collaboration.  I have reviewed the Advanced Practitioner's note and chart, and I agree with the management and plan.  UGONNA  ANYANWU, MD, FACOG Attending Obstetrician & Gynecologist Faculty Practice, [facility]   

## 2013-12-28 LAB — POCT PREGNANCY, URINE: Preg Test, Ur: NEGATIVE

## 2014-01-08 ENCOUNTER — Emergency Department (HOSPITAL_COMMUNITY)
Admission: EM | Admit: 2014-01-08 | Discharge: 2014-01-09 | Disposition: A | Discharge status: Home / Self Care | Payer: Self-pay | Attending: Emergency Medicine | Admitting: Emergency Medicine

## 2014-01-08 ENCOUNTER — Emergency Department (HOSPITAL_COMMUNITY): Payer: Self-pay

## 2014-01-08 ENCOUNTER — Encounter (HOSPITAL_COMMUNITY): Payer: Self-pay | Admitting: Emergency Medicine

## 2014-01-08 DIAGNOSIS — J309 Allergic rhinitis, unspecified: Secondary | ICD-10-CM | POA: Insufficient documentation

## 2014-01-08 DIAGNOSIS — J302 Other seasonal allergic rhinitis: Secondary | ICD-10-CM

## 2014-01-08 DIAGNOSIS — Z3202 Encounter for pregnancy test, result negative: Secondary | ICD-10-CM | POA: Insufficient documentation

## 2014-01-08 DIAGNOSIS — R109 Unspecified abdominal pain: Secondary | ICD-10-CM | POA: Insufficient documentation

## 2014-01-08 DIAGNOSIS — R05 Cough: Secondary | ICD-10-CM

## 2014-01-08 DIAGNOSIS — H5789 Other specified disorders of eye and adnexa: Secondary | ICD-10-CM | POA: Insufficient documentation

## 2014-01-08 DIAGNOSIS — R059 Cough, unspecified: Secondary | ICD-10-CM

## 2014-01-08 DIAGNOSIS — R079 Chest pain, unspecified: Secondary | ICD-10-CM | POA: Insufficient documentation

## 2014-01-08 DIAGNOSIS — Z8719 Personal history of other diseases of the digestive system: Secondary | ICD-10-CM | POA: Insufficient documentation

## 2014-01-08 LAB — CBC WITH DIFFERENTIAL/PLATELET
BASOS ABS: 0.1 10*3/uL (ref 0.0–0.1)
Basophils Relative: 0 % (ref 0–1)
Eosinophils Absolute: 0.6 10*3/uL (ref 0.0–0.7)
Eosinophils Relative: 5 % (ref 0–5)
HCT: 37.7 % (ref 36.0–46.0)
HEMOGLOBIN: 13.1 g/dL (ref 12.0–15.0)
LYMPHS PCT: 34 % (ref 12–46)
Lymphs Abs: 3.9 10*3/uL (ref 0.7–4.0)
MCH: 31.3 pg (ref 26.0–34.0)
MCHC: 34.7 g/dL (ref 30.0–36.0)
MCV: 90 fL (ref 78.0–100.0)
MONOS PCT: 5 % (ref 3–12)
Monocytes Absolute: 0.6 10*3/uL (ref 0.1–1.0)
NEUTROS ABS: 6.3 10*3/uL (ref 1.7–7.7)
Neutrophils Relative %: 56 % (ref 43–77)
Platelets: 274 10*3/uL (ref 150–400)
RBC: 4.19 MIL/uL (ref 3.87–5.11)
RDW: 12.4 % (ref 11.5–15.5)
WBC: 11.4 10*3/uL — AB (ref 4.0–10.5)

## 2014-01-08 LAB — URINALYSIS, ROUTINE W REFLEX MICROSCOPIC
Bilirubin Urine: NEGATIVE
Glucose, UA: NEGATIVE mg/dL
HGB URINE DIPSTICK: NEGATIVE
Ketones, ur: NEGATIVE mg/dL
Leukocytes, UA: NEGATIVE
Nitrite: NEGATIVE
Protein, ur: NEGATIVE mg/dL
SPECIFIC GRAVITY, URINE: 1.027 (ref 1.005–1.030)
Urobilinogen, UA: 0.2 mg/dL (ref 0.0–1.0)
pH: 5.5 (ref 5.0–8.0)

## 2014-01-08 LAB — COMPREHENSIVE METABOLIC PANEL
ALK PHOS: 92 U/L (ref 39–117)
ALT: 41 U/L — ABNORMAL HIGH (ref 0–35)
AST: 32 U/L (ref 0–37)
Albumin: 4.1 g/dL (ref 3.5–5.2)
BILIRUBIN TOTAL: 0.2 mg/dL — AB (ref 0.3–1.2)
BUN: 15 mg/dL (ref 6–23)
CHLORIDE: 104 meq/L (ref 96–112)
CO2: 26 mEq/L (ref 19–32)
CREATININE: 0.72 mg/dL (ref 0.50–1.10)
Calcium: 10 mg/dL (ref 8.4–10.5)
GFR calc Af Amer: >90 mL/min (ref >90)
GFR calc non Af Amer: >90 mL/min (ref >90)
Glucose, Bld: 85 mg/dL (ref 70–99)
POTASSIUM: 3.6 meq/L — AB (ref 3.7–5.3)
Sodium: 143 mEq/L (ref 137–147)
Total Protein: 7.8 g/dL (ref 6.0–8.3)

## 2014-01-08 LAB — PREGNANCY, URINE: PREG TEST UR: NEGATIVE

## 2014-01-08 MED ORDER — ALBUTEROL SULFATE (2.5 MG/3ML) 0.083% IN NEBU
5.0000 mg | INHALATION_SOLUTION | Freq: Once | RESPIRATORY_TRACT | Status: AC
  Administered 2014-01-08: 5 mg via RESPIRATORY_TRACT
  Filled 2014-01-08: qty 6

## 2014-01-08 MED ORDER — IPRATROPIUM BROMIDE 0.02 % IN SOLN
0.5000 mg | Freq: Once | RESPIRATORY_TRACT | Status: AC
  Administered 2014-01-08: 0.5 mg via RESPIRATORY_TRACT
  Filled 2014-01-08: qty 2.5

## 2014-01-08 NOTE — ED Notes (Signed)
The pt has had a cough for 3 weeks non productive.  Maybe a  Temp.  She is also c/o abd pain. lmp march

## 2014-01-09 MED ORDER — GUAIFENESIN 100 MG/5ML PO SOLN: 5.0000 mL | Freq: Four times a day (QID) | ORAL | Status: DC | PRN

## 2014-01-09 MED ORDER — LORATADINE 10 MG PO TABS: 10.0000 mg | ORAL_TABLET | Freq: Every day | ORAL | Status: DC

## 2014-01-09 MED ORDER — ALBUTEROL SULFATE HFA 108 (90 BASE) MCG/ACT IN AERS: 1.0000 | INHALATION_SPRAY | RESPIRATORY_TRACT | Status: DC | PRN

## 2014-01-09 MED ORDER — FLUTICASONE PROPIONATE 50 MCG/ACT NA SUSP
2.0000 | Freq: Every day | NASAL | Status: DC
  Administered 2014-01-09: 2 via NASAL
  Filled 2014-01-09: qty 16

## 2014-01-09 MED ORDER — FLUTICASONE PROPIONATE 50 MCG/ACT NA SUSP: 2.0000 | Freq: Every day | NASAL | Status: DC

## 2014-01-09 MED ORDER — LORATADINE 10 MG PO TABS
10.0000 mg | ORAL_TABLET | Freq: Every day | ORAL | Status: DC
  Administered 2014-01-09: 10 mg via ORAL
  Filled 2014-01-09: qty 1

## 2014-01-09 MED ORDER — ALBUTEROL SULFATE HFA 108 (90 BASE) MCG/ACT IN AERS
1.0000 | INHALATION_SPRAY | RESPIRATORY_TRACT | Status: DC | PRN
  Administered 2014-01-09: 1 via RESPIRATORY_TRACT
  Filled 2014-01-09: qty 6.7

## 2014-01-09 MED ORDER — GUAIFENESIN 100 MG/5ML PO SOLN
5.0000 mL | Freq: Once | ORAL | Status: AC
  Administered 2014-01-09: 100 mg via ORAL
  Filled 2014-01-09: qty 5

## 2014-01-09 NOTE — ED Notes (Signed)
Discharge instructions reviewed with translator. Pt denies any further questions, demonstrated proper use of inhaler and nasal spray.

## 2014-01-09 NOTE — ED Provider Notes (Signed)
CSN: [id number]     Arrival date & time 01/08/14  2107 History   First MD Initiated Contact with Patient 01/08/14 2129     Chief Complaint  Patient presents with  . cough and abd pain      (Consider location/radiation/quality/duration/timing/severity/associated sxs/prior Treatment) HPI 28 yo female with 3 weeks of nasal congestion, sinus pressure, itchy watery eyes, cough, chest wall and abdominal pain.  No prior h/o same.  No medications tried prior to arrival.  Pt reports she is trying to get pregnant, and wants medications safe in pregnancy.  Pt is nonsmoker, no fever.  Cough is nonproductive.  No n/v/d.   Past Medical History  Diagnosis Date  . Diverticulitis    Past Surgical History  Procedure Laterality Date  . Cholecystectomy    . Cesarean section     No family history on file. History  Substance Use Topics  . Smoking status: Never Smoker   . Smokeless tobacco: Never Used  . Alcohol Use: No   OB History   Grav Para Term Preterm Abortions TAB SAB Ect Mult Living   4 2 2  2  2   2      Review of Systems  All other systems reviewed and are negative.     Allergies  Review of patient's allergies indicates no known allergies.  Home Medications   Prior to Admission medications   Medication Sig Start Date End Date Taking? Authorizing Provider  promethazine (PHENERGAN) 12.5 MG tablet Take 2 tablets (25 mg total) by mouth every 6 (six) hours as needed for nausea or vomiting. 12/26/13  Yes Virginia Rochester, NP   BP 140/95  Pulse 103  Temp(Src) 98 F (36.7 C) (Oral)  Resp 16  Wt 192 lb 4.8 oz (87.227 kg)  SpO2 98%  LMP 12/01/2013 Physical Exam  Nursing note and vitals reviewed. Constitutional: She is oriented to person, place, and time. She appears well-developed and well-nourished.  HENT:  Head: Normocephalic and atraumatic.  Right Ear: External ear normal.  Left Ear: External ear normal.  Mouth/Throat: Oropharynx is clear and moist.  Nasal congestion noted   Eyes: Conjunctivae and EOM are normal. Pupils are equal, round, and reactive to light.  Neck: Normal range of motion. Neck supple. No JVD present. No tracheal deviation present. No thyromegaly present.  Cardiovascular: Normal rate, regular rhythm, normal heart sounds and intact distal pulses.  Exam reveals no gallop and no friction rub.   No murmur heard. Pulmonary/Chest: Effort normal and breath sounds normal. No stridor. No respiratory distress. She has no wheezes. She has no rales. She exhibits no tenderness.  Dry cough noted  Abdominal: Soft. Bowel sounds are normal. She exhibits no distension and no mass. There is no tenderness. There is no rebound and no guarding.  Musculoskeletal: Normal range of motion. She exhibits no edema and no tenderness.  Lymphadenopathy:    She has no cervical adenopathy.  Neurological: She is alert and oriented to person, place, and time. She exhibits normal muscle tone. Coordination normal.  Skin: Skin is warm and dry. No rash noted. No erythema. No pallor.  Psychiatric: She has a normal mood and affect. Her behavior is normal. Judgment and thought content normal.    ED Course  Procedures (including critical care time) Labs Review Labs Reviewed  CBC WITH DIFFERENTIAL - Abnormal; Notable for the following:    WBC 11.4 (*)    All other components within normal limits  COMPREHENSIVE METABOLIC PANEL - Abnormal; Notable for the  following:    Potassium 3.6 (*)    ALT 41 (*)    Total Bilirubin 0.2 (*)    All other components within normal limits  URINALYSIS, ROUTINE W REFLEX MICROSCOPIC  PREGNANCY, URINE    Imaging Review Dg Chest 2 View  01/08/2014   CLINICAL DATA:  Difficulty breathing, cough  EXAM: CHEST  2 VIEW  COMPARISON:  02/06/2013  FINDINGS: Decreased lung volumes.  Minimal enlargement of cardiac silhouette.  Mediastinal contours and pulmonary vascularity normal.  Lungs clear.  No pleural effusion or pneumothorax.  Mild chronic elevation of right  diaphragm.  Bones unremarkable.  IMPRESSION: No acute abnormalities.  Decreased lung volumes with minimal enlargement of cardiac silhouette.   Electronically Signed   By: Lavonia Dana M.D.   On: 01/08/2014 23:16     EKG Interpretation None      MDM   Final diagnoses:  Seasonal allergies  Cough    28 yo female with normal workup and symptoms that seem consistent with seasonal allergies.  Will start patient on flonase, claritin.    Kalman Drape, MD 01/09/14 [phone]

## 2014-01-09 NOTE — Discharge Instructions (Signed)
Rinitis alrgica (Allergic Rhinitis) La rinitis alrgica ocurre cuando las membranas mucosas de la nariz responden a los alrgenos. Los alrgenos son las partculas que estn en el aire y que hacen que el cuerpo tenga una reaccin IT consultant. Esto hace que usted libere anticuerpos alrgicos. A travs de una cadena de eventos, estos finalmente hacen que usted libere histamina en la corriente sangunea. Aunque la funcin de la histamina es proteger al organismo, es esta liberacin de histamina lo que provoca malestar, como los estornudos frecuentes, la congestin y goteo y Engineer, petroleum.  CAUSAS  La causa de la rinitis Regulatory affairs officer (fiebre del heno) son los alrgenos del polen que pueden provenir del csped, los rboles y Human resources officer. La causa de la rinitis Stage manager (rinitis alrgica perenne) son los alrgenos como los caros del polvo domstico, la caspa de las mascotas y las esporas del moho.  SNTOMAS   Secrecin nasal (congestin).  Goteo y picazn nasales con estornudos y Industrial/product designer. DIAGNSTICO  Su mdico puede ayudarlo a Actor alrgeno o los alrgenos que desencadenan sus sntomas. Si usted y su mdico no pueden Teacher, adult education cul es el alrgeno, pueden hacerse anlisis de sangre o estudios de la piel. TRATAMIENTO  La rinitis alrgica no tiene [place], pero puede controlarse mediante lo siguiente:  Medicamentos y vacunas contra la alergia (inmunoterapia).  Prevencin del alrgeno. La fiebre del heno a menudo puede tratarse con antihistamnicos en las formas de pldoras o aerosol nasal. Los antihistamnicos bloquean los efectos de la histamina. Existen medicamentos de venta libre que pueden ayudar con la congestin nasal y la hinchazn alrededor de los ojos. Consulte a su mdico antes de tomar o administrarse este medicamento.  Si la prevencin del alrgeno o el medicamento recetado no dan resultado, existen muchos medicamentos nuevos que su mdico puede recetarle. Pueden  usarse medicamentos ms fuertes si las medidas iniciales no son efectivas. Pueden aplicarse inyecciones desensibilizantes si los medicamentos y la prevencin no funcionan. La desensibilizacin ocurre cuando un paciente recibe vacunas constantes hasta que el cuerpo se vuelve menos sensible al alrgeno. Asegrese de Chartered certified accountant seguimiento con su mdico si los problemas continan. INSTRUCCIONES PARA EL CUIDADO EN EL HOGAR No es posible evitar por completo los alrgenos, pero puede reducir los sntomas al tomar medidas para limitar su exposicin a ellos. Es muy til saber exactamente a qu es alrgico para que pueda evitar sus desencadenantes especficos. SOLICITE ATENCIN MDICA SI:   Lorraine [name].  Desarrolla una tos que no se detiene fcilmente (persistente).  Le falta el aire.  Comienza a tener sibilancias.  Los sntomas interfieren con las actividades diarias normales. Document Released: 06/13/2005 Document Revised: 06/24/2013 [facility] Patient Information 2014 [place], [place].  Tos - Adultos  (Cough, Adult)  La tos es un reflejo que ayuda a limpiar las vas areas y Patent examiner. Puede ayudar a curar el organismo o ser Ardelia Mems reaccin a un irritante. La tos puede durar [employer] 2  3 semanas (aguda) o puede durar ms de 8 semanas (crnica)  CAUSAS  Tos aguda:   Infecciones virales o bacterianas. Tos crnica.   Infecciones.  Alergias.  Asma.  Goteo post nasal.  El hbito de fumar.  Acidez o reflujo gstrico.  Algunos medicamentos.  Problemas pulmonares crnicos  Cncer. SNTOMAS   Tos.  Cristy Hilts.  Dolor en el pecho.  Aumento en el ritmo respiratorio.  Ruidos agudos al respirar (sibilancias).  Moco coloreado al toser (esputo). TRATAMIENTO   Un tos de causa bacteriana puede tratarse con antibiticos.  La  tos de origen viral debe seguir su curso y no responde a los antibiticos.  El mdico podr recomendar otros tratamientos si tiene tos crnica. INSTRUCCIONES PARA  EL CUIDADO EN EL HOGAR   Solo tome medicamentos que se pueden comprar sin receta o recetados para Conservation officer, historic buildings, Tree surgeon o fiebre, como le indica el mdico. Utilice antitusivos slo en la forma indicada por el mdico.  Use un vaporizador o humidificador de niebla fra en la habitacin para ayudar a aflojar las secreciones.  Duerma en posicin semi erguida si la tos empeora por la noche.  Descanse todo lo que pueda.  Si fuma, abandone el hbito. SOLICITE ATENCIN MDICA DE INMEDIATO SI:   Observa pus en el esputo.  La tos empeora.  No puede controlar la tos con antitusivos y no puede dormir debido a Presenter, broadcasting.  Comienza a escupir sangre al toser.  Tiene dificultad para respirar.  El dolor empeora o no puede controlarlo con los medicamentos.  Tiene fiebre. ASEGRESE DE QUE:   Comprende estas instrucciones.  Controlar su enfermedad.  Solicitar ayuda de inmediato si no mejora o si empeora. Document Released: 04/11/2011 Document Revised: 11/26/2011 [facility] Patient Information 2014 [place], [place].  Fiebre de heno (Hay Fever) La fiebre de heno se trata de una reaccin alrgica a determinadas partculas que se encuentran en el aire. La fiebre de heno no puede transmitirse de persona a Nurse, learning disability. Este trastorno no puede curarse Dietitian. CAUSAS La causa de la fiebre del heno es algn factor que desencadena una reaccin alrgica alergenos). A continuacin se indican algunos ejemplos de alrgenos:   La [place]en [place]olin.  Las plumas.  La caspa de los [place].  Polen del csped y de los arboles  El humo del cigarrillo.  El polvo del hogar.  La polucin. SNTOMAS  Estornudos.  Enrojecimiento y [employer].  Picazn en el paladar.  Lagrimeo.  Garganta spera y dolorida.  Dolor de [place].  Disminucin del sentido del olfato y del gusto. DIAGNSTICO  El Museum/gallery exhibitions officer un examen fsico y le har preguntas sobre sus sntomas.Podrn indicarle pruebas de  alergia para determinar exactamente qu causa la fiebre.  TRATAMIENTO  Los medicamentos de venta libre pueden ayudar a [employer]. Entre ellos se incluyen:  Antihistamnicos  Engineer, civil (consulting) la congestin nasal.  Si estos medicamentos no le Training and development officer, existen muchos otros nuevos que el profesional que lo asiste puede prescribirle.  Algunas personas se benefician con las inyecciones para la [place], cuando otros medicamentos no les [employer]. INSTRUCCIONES PARA EL CUIDADO EN EL HOGAR   En lo posible, evite los alrgenos que causan los sntomas.  Tome todos los [employer] como le indic el mdico. SOLICITE ATENCIN MDICA SI:   Tiene sntomas graves de [place] y los medicamentos que utiliza no lo mejoran.  El tratamiento fue efectivo una vez, pero tiene nuevos sntomas.  Siente congestin y presin en los senos nasales.  Le sube la fiebre o tiene dolor de [place].  Tiene una secrecin nasal espesa.  Sufre asma y la tos y las sibilancias empeoran. SOLICITE ATENCIN MDICA DE INMEDIATO SI:  Presenta hinchazn en la lengua o los labios.  Tiene problemas para respirar.  Se siente mareado o como si se estuviera por [employer].  Tiene sudor fro.  Tiene fiebre. Document Released: 09/03/2005 Document Revised: 11/26/2011 [facility] Patient Information 2014 [place], [place].

## 2014-01-09 NOTE — ED Notes (Signed)
Pharmacy sent note for missing Claritin.

## 2014-01-09 NOTE — ED Notes (Signed)
Pt states dry cough/sneezing/chest discomfort with coughing. Reports this for 3 weeks. Pressure to neck and sinus area as well. Abdominal pain only when coughing a lot.

## 2014-01-20 ENCOUNTER — Emergency Department (HOSPITAL_COMMUNITY): Payer: Self-pay

## 2014-01-20 ENCOUNTER — Encounter (HOSPITAL_COMMUNITY): Payer: Self-pay | Admitting: Emergency Medicine

## 2014-01-20 DIAGNOSIS — Z9089 Acquired absence of other organs: Secondary | ICD-10-CM | POA: Insufficient documentation

## 2014-01-20 DIAGNOSIS — R05 Cough: Secondary | ICD-10-CM | POA: Insufficient documentation

## 2014-01-20 DIAGNOSIS — R071 Chest pain on breathing: Secondary | ICD-10-CM | POA: Insufficient documentation

## 2014-01-20 DIAGNOSIS — N390 Urinary tract infection, site not specified: Secondary | ICD-10-CM | POA: Insufficient documentation

## 2014-01-20 DIAGNOSIS — Z8719 Personal history of other diseases of the digestive system: Secondary | ICD-10-CM | POA: Insufficient documentation

## 2014-01-20 DIAGNOSIS — Z79899 Other long term (current) drug therapy: Secondary | ICD-10-CM | POA: Insufficient documentation

## 2014-01-20 DIAGNOSIS — R059 Cough, unspecified: Secondary | ICD-10-CM | POA: Insufficient documentation

## 2014-01-20 DIAGNOSIS — O9989 Other specified diseases and conditions complicating pregnancy, childbirth and the puerperium: Secondary | ICD-10-CM | POA: Insufficient documentation

## 2014-01-20 DIAGNOSIS — O2390 Unspecified genitourinary tract infection in pregnancy, unspecified trimester: Secondary | ICD-10-CM | POA: Insufficient documentation

## 2014-01-20 LAB — URINALYSIS, ROUTINE W REFLEX MICROSCOPIC
Bilirubin Urine: NEGATIVE
GLUCOSE, UA: NEGATIVE mg/dL
HGB URINE DIPSTICK: NEGATIVE
Ketones, ur: 15 mg/dL — AB
Nitrite: POSITIVE — AB
Protein, ur: NEGATIVE mg/dL
Specific Gravity, Urine: 1.024 (ref 1.005–1.030)
Urobilinogen, UA: 0.2 mg/dL (ref 0.0–1.0)
pH: 6 (ref 5.0–8.0)

## 2014-01-20 LAB — CBC
HCT: 37.2 % (ref 36.0–46.0)
Hemoglobin: 12.9 g/dL (ref 12.0–15.0)
MCH: 31.2 pg (ref 26.0–34.0)
MCHC: 34.7 g/dL (ref 30.0–36.0)
MCV: 89.9 fL (ref 78.0–100.0)
PLATELETS: 260 10*3/uL (ref 150–400)
RBC: 4.14 MIL/uL (ref 3.87–5.11)
RDW: 12.3 % (ref 11.5–15.5)
WBC: 8.6 10*3/uL (ref 4.0–10.5)

## 2014-01-20 LAB — BASIC METABOLIC PANEL
BUN: 13 mg/dL (ref 6–23)
CALCIUM: 9.1 mg/dL (ref 8.4–10.5)
CO2: 22 mEq/L (ref 19–32)
CREATININE: 0.72 mg/dL (ref 0.50–1.10)
Chloride: 105 mEq/L (ref 96–112)
Glucose, Bld: 100 mg/dL — ABNORMAL HIGH (ref 70–99)
Potassium: 3.4 mEq/L — ABNORMAL LOW (ref 3.7–5.3)
SODIUM: 139 meq/L (ref 137–147)

## 2014-01-20 LAB — POC URINE PREG, ED: Preg Test, Ur: POSITIVE — AB

## 2014-01-20 LAB — I-STAT TROPONIN, ED: Troponin i, poc: 0 ng/mL (ref 0.00–0.08)

## 2014-01-20 LAB — URINE MICROSCOPIC-ADD ON

## 2014-01-20 NOTE — ED Notes (Signed)
Patient with chest pain and abdominal pain, nausea no vomiting.  Patient states she does have some lower back pain.  Patient states that the pain in her back is when she goes to sit or lay down.

## 2014-01-20 NOTE — ED Notes (Signed)
Patient interviewed in triage via Interpreter phone.`

## 2014-01-21 ENCOUNTER — Emergency Department (HOSPITAL_COMMUNITY)
Admission: EM | Admit: 2014-01-21 | Discharge: 2014-01-21 | Disposition: A | Discharge status: Home / Self Care | Payer: Self-pay | Attending: Emergency Medicine | Admitting: Emergency Medicine

## 2014-01-21 DIAGNOSIS — R05 Cough: Secondary | ICD-10-CM

## 2014-01-21 DIAGNOSIS — R0789 Other chest pain: Secondary | ICD-10-CM

## 2014-01-21 DIAGNOSIS — Z3490 Encounter for supervision of normal pregnancy, unspecified, unspecified trimester: Secondary | ICD-10-CM

## 2014-01-21 DIAGNOSIS — O2340 Unspecified infection of urinary tract in pregnancy, unspecified trimester: Secondary | ICD-10-CM

## 2014-01-21 DIAGNOSIS — R059 Cough, unspecified: Secondary | ICD-10-CM

## 2014-01-21 MED ORDER — NITROFURANTOIN MONOHYD MACRO 100 MG PO CAPS
100.0000 mg | ORAL_CAPSULE | Freq: Once | ORAL | Status: AC
  Administered 2014-01-21: 100 mg via ORAL
  Filled 2014-01-21: qty 1

## 2014-01-21 MED ORDER — DOXYLAMINE-PYRIDOXINE 10-10 MG PO TBEC: 2.0000 | DELAYED_RELEASE_TABLET | Freq: Every evening | ORAL | Status: DC | PRN

## 2014-01-21 MED ORDER — ACETAMINOPHEN 325 MG PO TABS
650.0000 mg | ORAL_TABLET | Freq: Once | ORAL | Status: AC
  Administered 2014-01-21: 650 mg via ORAL
  Filled 2014-01-21: qty 2

## 2014-01-21 MED ORDER — NITROFURANTOIN MONOHYD MACRO 100 MG PO CAPS: 100.0000 mg | ORAL_CAPSULE | Freq: Two times a day (BID) | ORAL | Status: DC

## 2014-01-21 MED ORDER — LORATADINE 10 MG PO TABS: 10.0000 mg | ORAL_TABLET | Freq: Every day | ORAL | Status: DC

## 2014-01-21 MED ORDER — GUAIFENESIN 100 MG/5ML PO SOLN
5.0000 mL | Freq: Once | ORAL | Status: AC
  Administered 2014-01-21: 100 mg via ORAL
  Filled 2014-01-21: qty 5

## 2014-01-21 MED ORDER — GUAIFENESIN 100 MG/5ML PO SOLN: 5.0000 mL | Freq: Four times a day (QID) | ORAL | Status: DC | PRN

## 2014-01-21 NOTE — Discharge Instructions (Signed)
Take medications as prescribed.  They are safe in pregnancy.  Tylenol may be taken for pain.  Drink plenty of fluids, eat small amounts as needed for nausea.  Follow up with an local OB doctor for further prenatal care.  Return to the ER for fevers, worsening lower abdominal pain, or if you are unable to keep down your medications.  Tome los medicamentos segn las indicaciones. Ellos son seguros Solicitor. Tylenol puede tomar para Conservation officer, historic buildings. Tome muchos lquidos , comer pequeas cantidades , segn sea necesario para las nuseas . Haga un seguimiento con un mdico obstetra local para obtener ms atencin prenatal. Regreso a la sala de emergencia para las fiebres , empeoramiento dolor abdominal bajo , o si usted es incapaz de Theatre manager bajos sus medicamentos.   Dolor de la pared torcica  (Chest Wall Pain)  El dolor en la pared torcica se siente en la zona de los huesos y msculos del trax. Podrn pasar hasta 6 semanas hasta que comience a mejorar. Podra pasar ms tiempo si es Customer service manager. Puede aparecer sin motivo. Otras veces, algunos factores como grmenes, lesiones, tos o ejercicios pueden Multimedia programmer. CUIDADOS EN EL HOGAR   Evite las actividades que lo cansen o le causen Social research officer, government. Trate de no usar los msculos del pecho, el vientre (abdomen) ni los msculos laterales. No levante objetos pesados.  Aplique hielo sobre la zona dolorida.  Ponga el hielo en una bolsa plstica.  Colquese una toalla entre la piel y la bolsa de hielo.  Deje el hielo durante 15 a 20 minutos durante los 2 primeros das.  Slo tome los medicamentos que le indique el mdico. SOLICITE AYUDA DE INMEDIATO SI:   Siente ms dolor o el dolor es muy molesto.  Tiene fiebre.  El dolor en el pecho [place].  Tiene nuevos sntomas.  Tiene malestar estomacal (nuseas) ovmitos.  Si se siente sudoroso o mareado.  Tiene tos con mucosidad (flema).  Tose y escupe sangre. ASEGRESE DE QUE:    Comprende estas instrucciones.  Controlar su enfermedad.  Solicitar ayuda de inmediato si no mejora o si empeora. Document Released: 08/23/2011 Document Revised: 11/26/2011 [facility] Patient Information 2014 [place], [place].  Tos - Adultos  (Cough, Adult)  La tos es un reflejo que ayuda a limpiar las vas areas y Patent examiner. Puede ayudar a curar el organismo o ser Ardelia Mems reaccin a un irritante. La tos puede durar [employer] 2  3 semanas (aguda) o puede durar ms de 8 semanas (crnica)  CAUSAS  Tos aguda:   Infecciones virales o bacterianas. Tos crnica.   Infecciones.  Alergias.  Asma.  Goteo post nasal.  El hbito de fumar.  Acidez o reflujo gstrico.  Algunos medicamentos.  Problemas pulmonares crnicos  Cncer. SNTOMAS   Tos.  Cristy Hilts.  Dolor en el pecho.  Aumento en el ritmo respiratorio.  Ruidos agudos al respirar (sibilancias).  Moco coloreado al toser (esputo). TRATAMIENTO   Un tos de causa bacteriana puede tratarse con antibiticos.  La tos de origen viral debe seguir su curso y no responde a los antibiticos.  El mdico podr recomendar otros tratamientos si tiene tos crnica. INSTRUCCIONES PARA EL CUIDADO EN EL HOGAR   Solo tome medicamentos que se pueden comprar sin receta o recetados para Conservation officer, historic buildings, Tree surgeon o fiebre, como le indica el mdico. Utilice antitusivos slo en la forma indicada por el mdico.  Use un vaporizador o humidificador de niebla fra en la habitacin para ayudar a aflojar las  secreciones.  Duerma en posicin semi erguida si la tos empeora por la noche.  Descanse todo lo que pueda.  Si fuma, abandone el hbito. SOLICITE ATENCIN MDICA DE INMEDIATO SI:   Observa pus en el esputo.  La tos empeora.  No puede controlar la tos con antitusivos y no puede dormir debido a Presenter, broadcasting.  Comienza a escupir sangre al toser.  Tiene dificultad para respirar.  El dolor empeora o no puede controlarlo con los medicamentos.  Tiene  fiebre. ASEGRESE DE QUE:   Comprende estas instrucciones.  Controlar su enfermedad.  Solicitar ayuda de inmediato si no mejora o si empeora. Document Released: 04/11/2011 Document Revised: 11/26/2011 [facility] Patient Information 2014 [place], [place].  cido flico en el embarazo (Folic Acid in Pregnancy) El cido flico es una vitamina B que ayuda a prevenir defectos del tubo neural. El tubo neural es la parte de un feto en desarrollo que se convierte en el cerebro y la mdula espinal. Cuando el tubo neural no se cierra como corresponde, el beb nace con un defecto del tubo neural. Los defectos del tubo neural incluyen espina bfida, hernia de la mdula espinal y la ausencia de [place] parte o de todo el cerebro (anencefalia).  Tome cido flico al menos 4semanas antes de quedar embarazada y [employer] primeros 95meses de [place], que es cuando se desarrolla el tubo neural. El cido flico est disponible en la mayora de las multivitaminas o como suplemento con cido flico solamente, y tambin lo contienen algunos alimentos. El consumo de la cantidad [place] de cido flico antes de la concepcin y durante el embarazo disminuye las probabilidades de Best boy un beb con un defecto del tubo neural. El hecho de tomar cido flico no influir en un defecto del tubo neural en el caso de que ya se haya desarrollado. DIAGNSTICO   Si el feto tiene un defecto del tubo neural, un anlisis de alfa fetoprotena (AFP) en la sangre o de lquido amnitico mostrar niveles altos de alfa fetoprotena. Este anlisis se hace a todas las Quarry manager.  Una ecografa puede detectar un defecto del tubo neural. LO QUE USTED PUEDE HACER:  Tome una multivitamina con al [employer] 0,48miligramos ([id number]) de cido flico [employer], al menos 4semanas antes de quedar embarazada y [place] primeras 12semanas de Media planner.  Si ya ha tenido un beb con un defecto del tubo neural, tome  31miligramos ([id number]) de cido flico [employer]. Tome esta cantidad desde 35mes antes de intentar quedar [place] y siga tomndola durante los primeros 24meses de [place]. Si tiene convulsiones o toma medicamentos para controlarlas, infrmeselo a su obstetra. Siga tomando el cido flico, excepto si le indican lo contrario.  CIDO FLICO EN LOS ALIMENTOS Siga una dieta saludable con alimentos que contengan cido flico, la forma natural de la vitamina. Algunos de estos alimentos son:  Cereales fortificados para el desayuno.  Lentejas.  Esprragos.  [place].  Vsceras (hgado).  Frijoles negros.  [place] (solo si no es IT consultant).  Brcoli.  Emiliano Dyer.  Jugo de naranja (es mejor el jugo concentrado).  Pastas y panes enriquecidos.  Priscille Loveless. CONSULTE A SU MDICO EN LOS SIGUIENTES CASOS:  Est en el primer trimestre de embarazo y tiene niveles altos de azcar en la sangre.  Est en el primer trimestre de embarazo y tiene fiebre alta. En casi todos los casos, un feto en el que se detecta un defecto del tubo neural necesitar atencin especializada, que puede no estar disponible  en todos los hospitales. Hable con su mdico sobre lo que es mejor para usted y su beb. Document Released: 06/24/2013 [facility] Patient Information 2014 [place], [place].  Embarazo, Psychologist, educational trimestre (Pregnancy, First Trimester) El primer trimestre son los primeros tres meses en los que el beb crece dentro suyo. Es importante seguir las indicaciones del profesional que lo asiste. CUIDADOS EN EL HOGAR  No fumar.  No beba alcohol.  Tomar slo los medicamentos que el mdico le haya indicado.  Ejercicios.  Consuma una dieta saludable. Consuma alimentos balanceados.  Puede tener relaciones sexuales si no hay problemas con el embarazo.  Ayuda para los nuseas matinales:  Coma galletitas saladas antes de levantarse por la [facility].  Coma 4  5 comidas pequeas por  da en vez de tres grandes.  Beba lquidos entre comidas, no durante ellas.  Concurra a la Publishing copy profesional que lo asiste de acuerdo a lo que le haya indicado.  A veces necesita vitaminas extra y hierro Solicitor. Pregunte al mdico si las necesita. SOLICITE AYUDA DE INMEDIATO SI:  La temperatura oral.  Percibe que tiene una prdida con olor ftido que proviene de la vagina.  Observa una hemorragia que proviene de la vagina.  Siente dolor intenso en el vientre o la espalda.  Vomita sangre. Vmitos que parecen borra de caf.  Pierde ms de 1 Kg en una semana.  Gana ms de 2 Kg en una semana.  Aumenta ms de 1 Kg en una semana Y nota los tobillos, los pies o las piernas hinchados.  Siente mareos o se desmaya.  Ha estado cerca de personas con rubola. , varicela, o la quinta enfermedad.  Presenta dolor de cabeza, diarrea, dolor al orinar o le falta la respiracin. Document Released: 11/30/2008 Document Revised: 11/26/2011 [facility] Patient Information 2014 [place], [place].  Medicamentos durante el embarazo  (Medicines During Pregnancy) Durante el embarazo, hay medicamentos que son seguros y otros no lo son. Entre los [employer] se [employer] recetados, los de [place], las cremas tpicas que se aplican en la piel y todos los medicamentos de hierbas. Los medicamentos se clasifican en clase A, B, C, o D. Los medicamentos de clase A y B han demostrado ser seguros en el Media planner. Los medicamentos de clase C tambin se consideran seguros durante el [place], pero slo deben usarse cuando son necesarios. Los medicamentos clase D no deben Product manager. Pueden ser dainos para un beb.  Lo mejor es Chief Executive Officer cantidad posible de medicamentos durante el [place]. Sin embargo, algunos son necesarios para cuidar la salud del beb y Therapist, nutritional. A veces, es ms peligroso dejar de tomar ciertos medicamentos que continuar usndolos. Este es el  caso de las personas que sufren enfermedades de larga duracin (crnicas) como el asma, la diabetes o la presin arterial alta (hipertensin). Si est embarazada y sufre una enfermedad crnica, llame a su mdico de inmediato. Lleve una lista de los medicamentos que [place] y sus dosis a las visitas mdicas. Si usted est planeando quedar embarazada, programe una cita con el mdico e infrmele acerca de los medicamentos que toma.  Por ltimo, anote el nmero de telfono de Midwife. Podr responder preguntas sobre la clase a que pertenece un medicamento y su seguridad. No podr darle asesoramiento en cuanto a si debe o no debe tomar un medicamento.  MEDICAMENTOS SEGUROS Y NO SEGUROS  Hay una larga lista de medicamentos que se consideran seguros para Barrister's clerk  embarazo. A continuacin se [place]. Para determinados medicamentos, consulte con su mdico.  Medicamentos para laalergia La loratadina, cetirizina y clorfeniramina son medicamentos seguros. Algunos aerosoles nasales que contienen corticoides son seguros. Consulte a su mdico acerca de las marcas especficas que son seguras.  Analgsicos El paracetamol y el paracetamol con codena son seguros. Todos los otros medicamentos anti-inflamatorios no esteroideos (AINE) no son seguros. Aqu se incluye el ibuprofeno.  Anticidos  Muchos anticidos de venta libre son seguros para tomar. Consulte a su mdico acerca de las marcas especficas que son seguras. La famotidina, ranitidina, lansoprazol estn permitidos. El omeprazol se considera seguro para tomar en el segundo trimestre.  Antibiticos  Hay varios antibiticos que Health visitor. Estos incluyen tetraciclina, quinolonas y sulfas, pero puede haber otros. Consulte a su mdico antes de tomar cualquier antibitico.  Antihistamnicos  Consulte a su mdico acerca de las marcas especficas que son seguras.  Medicamentos para el asma  La mayora de los inhaladores con  corticoides para el asma son seguros. Hable con su mdico para obtener informacin especfica.  Calcio  Los suplementos de calcio son seguros. No consuma calcio de ostras.  Medicamentos para la tos y el resfro  Es seguro que utilice productos que contienen guaifenesina o dextrometorfano. Consulte a su mdico acerca de las marcas especficas que son seguras. No es seguro tomar productos que contengan aspirina o ibuprofeno.  Medicamentos descongestivos Los productos que contienen pseudoefedrina son seguros para tomar en el segundo y Chartered certified accountant trimestre.  Antidepresivos  Consulte a su mdico acerca de estos medicamentos.  Antidiarreicos  Es seguro tomar loperamida. Consulte a su mdico acerca de las marcas especficas que son seguras. No es seguro tomar medicamentos antidiarreicos que contengan bismuto.  Gotas oftlmicas  Las gotas oftlmicas para la alergia deben limitarse.  Hierro  Es seguro usar en el embarazo ciertos medicamentos que contienen hierro para la anemia. Ellos requieren [employer].  Medicamentos para las nauseas  Es seguro tomar doxilamina y vitamina B6 segn las indicaciones. Si fuera necesario, existen otros medicamentos con receta disponibles.  Pldoras para dormir  Es seguro tomar difenhidramina y paracetamol con difenhidramina.  Corticoides  Las cremas con hidrocortisona son seguras si se usan como se indica. Los corticoides por va oral requieren prescripcin mdica. No es Sales executive crema para las hemorroides que contengan pramoxina o fenilefrina.  Laxantes  Es seguro tomar laxantes. Evite su uso diario o prolongado.  Medicamentos para la tiroides  Es importante que siga usando el medicamento para la tiroides. Necesita ser controlada por su mdico.  Medicamentos para uso vaginal  El mdico le recetar un medicamento si usted tiene una infeccin vaginal. Ciertos medicamentos antifngicos son seguros si sufre una infeccin de transmisin sexual  (ETS). Consulte a su mdico.  Document Released: 09/03/2005 Document Revised: 05/06/2013 [facility] Patient Information 2014 [place], [place].  Embarazo e infeccin del tracto urinario (Pregnancy and Urinary Tract Infection) Una infeccin urinaria (IU) puede ocurrir en Clinical cytogeneticist del tracto urinario. La infeccin urinaria puede [employer] utteres, los riones (pielonefritis), la vejiga (cistitis) y Geologist, engineering (uretritis). Todas las mujeres embarazadas deben ser estudiadas para diagnosticar la presencia de bacterias en el tracto urinario. La identificacin y el tratamiento de una infeccin urinaria disminuye el riesgo de un parto prematuro y de Actor infecciones ms graves en la madre y el beb. CAUSAS Las bacterias causan casi todas las infecciones urinarias.  FACTORES DE RIESGO Hay muchos factores que pueden aumentar sus probabilidades de Armed forces technical officer  infeccin urinaria (IU) durante el embarazo. Pueden ser:  Lucilla Edin uretra corta.  Falta de aseo y malos hbitos de higiene.  [place].  Obstruccin de la orina en el tracto urinario.  Problemas con los msculos o nervios plvicos.  Diabetes.  Obesidad.  Problemas en la vejiga despus de tener varios hijos.  Antecedentes de infeccin urinaria. SIGNOS Y SNTOMAS   Dolor, ardor o sensacin de ardor al [employer].  Sentir la necesidad de Garment/textile technologist de inmediato [place]).  Prdida del control vesical (incontinencia urinaria).  Orinar con ms frecuencia de lo comn en el embarazo.  Malestar en la zona inferior del abdomen o en la espalda.  Bennie Hind turbia.  Sangre en la orina (hematuria).  Cristy Hilts. Cuando se infectan los riones, los sntomas pueden ser:  Dolor de espalda.  Dolor lateral en el lado derecho ms que en el lado izquierdo.  Cristy Hilts.  Escalofros.  Nuseas.  Vmitos. DIAGNSTICO  Una infeccin del tracto urinario se suele diagnosticar a travs de la orina. A veces se realizan pruebas  y procedimientos adicionales. Estos pueden ser:  Steward Drone de los riones, los urteres, la vejiga y Geologist, engineering.  Observar la vejiga con un tubo que ilumina (cistoscopa). TRATAMIENTO Por lo general, las IU pueden tratarse con medicamentos antibiticos.  INSTRUCCIONES PARA EL CUIDADO EN EL HOGAR   Tome slo medicamentos de venta libre o recetados, segn las indicaciones del mdico. Si le han recetado antibiticos, tmelos segn las indicaciones. Tmelos todos, aunque se sienta mejor.  Beba suficiente lquido para Consulting civil engineer orina clara o de color amarillo plido.  No tenga relaciones sexuales hasta que la infeccin haya desaparecido o el mdico la autorice.  Asegrese de [place] hagan estudios para Hydrographic surveyor una infeccin urinaria durante el  [place]. Estas infecciones suelen reaparecer. Para prevenir una infeccin urinaria en el futuro  Practique buenos hbitos higinicos. Siempre debe limpiarse desde adelante hacia atrs. Use el tissue slo una vez.  No retenga la orina. Orine tan pronto como sea posible cuando tenga ganas.  No se haga duchas vaginales ni use desodorantes en aerosol.  Lave con agua tibia y jabn alrededor de la zona genital y el ano.  Vace la vejiga antes y despus de Clinical biochemist.  Use ropa interior con algodn en la entrepierna.  Evite la cafena y las bebidas gaseosas. Estas sustancias irritan la vejiga.  Beba jugo de arndanos o tome comprimidos de arndano. Esto puede disminuir el riesgo de sufrir una infeccin urinaria.  No beba alcohol.  Cumpla con las visitas de control y hgase todos los anlisis segn lo programado. SOLICITE ATENCIN MDICA SI:   Los sntomas empeoran.  Tiene fiebre an despus de 2 das [place].  Tiene una erupcin.  Siente que usted tiene problemas con los medicamentos recetados.  Tiene flujo vaginal anormal. SOLICITE ATENCIN MDICA DE INMEDIATO SI:   Siente dolor en la espalda o a los  lados.  Tiene escalofros.  Observa sangre en la orina.  Tiene nuseas o vmitos.  Siente contracciones en el tero.  Tiene una perdida de lquido en chorro por la vagina. ASEGRESE DE QUE:  Comprende estas instrucciones.   Controlar su afeccin.   Recibir ayuda de inmediato si no mejora o si empeora.  Document Released: 05/28/2012 Document Revised: 06/24/2013 [facility] Patient Information 2014 [place], [place].

## 2014-01-21 NOTE — ED Provider Notes (Signed)
CSN: [id number]     Arrival date & time 01/20/14  2200 History   First MD Initiated Contact with Patient 01/21/14 0140     Chief Complaint  Patient presents with  . Chest Pain  . Abdominal Pain     (Consider location/radiation/quality/duration/timing/severity/associated sxs/prior Treatment) HPI 28 year old female presents to emergency department from home with complaint of persistent cough and chest pain, lower abdominal and back pain.  She reports frequency and pain with urination.  She denies any fevers chills.  She has had nausea but no vomiting.  Cough has been ongoing for about a month.  Chest and abdominal pain ongoing for a week.  Last menstrual period was March 13.  Patient was seen by me 2 weeks ago, at that time she had nasal congestion sinus pressure cough chest wall and abdominal pain.  She reports she only took the medications prescribed at that time for a few days.  She reports she was concerned, as she is trying to get pregnant it may cause problems with her fetus.  She reports negative pregnancy test about 2-3 weeks ago. Past Medical History  Diagnosis Date  . Diverticulitis    Past Surgical History  Procedure Laterality Date  . Cholecystectomy    . Cesarean section     No family history on file. History  Substance Use Topics  . Smoking status: Never Smoker   . Smokeless tobacco: Never Used  . Alcohol Use: No   OB History   Grav Para Term Preterm Abortions TAB SAB Ect Mult Living   4 2 2  2  2   2      Review of Systems  See History of Present Illness; otherwise all other systems are reviewed and negative   Allergies  Review of patient's allergies indicates no known allergies.  Home Medications   Prior to Admission medications   Medication Sig Start Date End Date Taking? Authorizing Provider  albuterol (PROVENTIL HFA;VENTOLIN HFA) 108 (90 BASE) MCG/ACT inhaler Inhale 1-2 puffs into the lungs every 4 (four) hours as needed for wheezing or shortness of breath.  01/09/14   Kalman Drape, MD  Doxylamine-Pyridoxine 10-10 MG TBEC Take 2 tablets by mouth at bedtime as needed (morning sickness). 01/21/14   Kalman Drape, MD  guaiFENesin (ROBITUSSIN) 100 MG/5ML SOLN Take 5 mLs (100 mg total) by mouth every 6 (six) hours as needed for cough or to loosen phlegm. 01/21/14   Kalman Drape, MD  loratadine (CLARITIN) 10 MG tablet Take 1 tablet (10 mg total) by mouth daily. 01/21/14   Kalman Drape, MD  nitrofurantoin, macrocrystal-monohydrate, (MACROBID) 100 MG capsule Take 1 capsule (100 mg total) by mouth 2 (two) times daily. 01/21/14   Kalman Drape, MD   BP 101/62  Pulse 88  Temp(Src) 98.8 F (37.1 C) (Oral)  Resp 16  SpO2 99%  LMP 11/27/2013 Physical Exam  Nursing note and vitals reviewed. Constitutional: She is oriented to person, place, and time. She appears well-developed and well-nourished. No distress.  HENT:  Head: Normocephalic and atraumatic.  Right Ear: External ear normal.  Left Ear: External ear normal.  Nose: Nose normal.  Mouth/Throat: Oropharynx is clear and moist.  Eyes: Conjunctivae and EOM are normal. Pupils are equal, round, and reactive to light.  Neck: Normal range of motion. Neck supple. No JVD present. No tracheal deviation present. No thyromegaly present.  Cardiovascular: Normal rate, regular rhythm, normal heart sounds and intact distal pulses.  Exam reveals no gallop and no  friction rub.   No murmur heard. Pulmonary/Chest: Effort normal and breath sounds normal. No stridor. No respiratory distress. She has no wheezes. She has no rales. She exhibits tenderness.  Dry cough noted.  Patient has chest wall tenderness to palpation which she reports reproduces the pain that brought her to the ER tonight  Abdominal: Soft. Bowel sounds are normal. She exhibits no distension and no mass. There is tenderness. There is no rebound and no guarding.  Musculoskeletal: Normal range of motion. She exhibits tenderness (patient has SI joint tenderness  bilaterally, left greater than right.). She exhibits no edema.  Lymphadenopathy:    She has no cervical adenopathy.  Neurological: She is alert and oriented to person, place, and time. She has normal reflexes. She exhibits normal muscle tone. Coordination normal.  Skin: Skin is warm and dry. No rash noted. No erythema. No pallor.  Psychiatric: She has a normal mood and affect. Her behavior is normal. Judgment and thought content normal.    ED Course  Procedures (including critical care time) Labs Review Labs Reviewed  BASIC METABOLIC PANEL - Abnormal; Notable for the following:    Potassium 3.4 (*)    Glucose, Bld 100 (*)    All other components within normal limits  URINALYSIS, ROUTINE W REFLEX MICROSCOPIC - Abnormal; Notable for the following:    APPearance CLOUDY (*)    Ketones, ur 15 (*)    Nitrite POSITIVE (*)    Leukocytes, UA TRACE (*)    All other components within normal limits  URINE MICROSCOPIC-ADD ON - Abnormal; Notable for the following:    Squamous Epithelial / LPF FEW (*)    Bacteria, UA MANY (*)    All other components within normal limits  POC URINE PREG, ED - Abnormal; Notable for the following:    Preg Test, Ur POSITIVE (*)    All other components within normal limits  URINE CULTURE  CBC  I-STAT TROPOININ, ED    Imaging Review No results found.   EKG Interpretation None      MDM   Final diagnoses:  Cough  Pregnancy  Chest wall pain  UTI in pregnancy    28 year old female with persistent cough.  She has urinary tract infection.  Noted to be pregnant.  Will start [place].  Patient has been reassured that medications given to her are safe in pregnancy.  Patient to followup with OB for recheck of urine and continued pregnancy care.    Kalman Drape, MD 01/21/14 [phone]

## 2014-01-22 ENCOUNTER — Inpatient Hospital Stay (HOSPITAL_COMMUNITY)
Admission: AD | Admit: 2014-01-22 | Discharge: 2014-01-22 | Disposition: A | Discharge status: Home / Self Care | Payer: Self-pay | Source: Ambulatory Visit | Attending: Family Medicine | Admitting: Family Medicine

## 2014-01-22 ENCOUNTER — Inpatient Hospital Stay (HOSPITAL_COMMUNITY): Payer: Self-pay

## 2014-01-22 ENCOUNTER — Encounter (HOSPITAL_COMMUNITY): Payer: Self-pay

## 2014-01-22 DIAGNOSIS — Z3490 Encounter for supervision of normal pregnancy, unspecified, unspecified trimester: Secondary | ICD-10-CM

## 2014-01-22 DIAGNOSIS — O9989 Other specified diseases and conditions complicating pregnancy, childbirth and the puerperium: Principal | ICD-10-CM

## 2014-01-22 DIAGNOSIS — R109 Unspecified abdominal pain: Secondary | ICD-10-CM | POA: Insufficient documentation

## 2014-01-22 DIAGNOSIS — Z3201 Encounter for pregnancy test, result positive: Secondary | ICD-10-CM

## 2014-01-22 DIAGNOSIS — R1084 Generalized abdominal pain: Secondary | ICD-10-CM

## 2014-01-22 DIAGNOSIS — M545 Low back pain, unspecified: Secondary | ICD-10-CM | POA: Insufficient documentation

## 2014-01-22 DIAGNOSIS — O99891 Other specified diseases and conditions complicating pregnancy: Secondary | ICD-10-CM | POA: Insufficient documentation

## 2014-01-22 LAB — CBC
HEMATOCRIT: 35.7 % — AB (ref 36.0–46.0)
HEMOGLOBIN: 12.2 g/dL (ref 12.0–15.0)
MCH: 31 pg (ref 26.0–34.0)
MCHC: 34.2 g/dL (ref 30.0–36.0)
MCV: 90.6 fL (ref 78.0–100.0)
Platelets: 256 10*3/uL (ref 150–400)
RBC: 3.94 MIL/uL (ref 3.87–5.11)
RDW: 12.3 % (ref 11.5–15.5)
WBC: 9.5 10*3/uL (ref 4.0–10.5)

## 2014-01-22 LAB — HCG, QUANTITATIVE, PREGNANCY: hCG, Beta Chain, Quant, S: 409 m[IU]/mL — ABNORMAL HIGH (ref <5)

## 2014-01-22 LAB — WET PREP, GENITAL
CLUE CELLS WET PREP: NONE SEEN
TRICH WET PREP: NONE SEEN
YEAST WET PREP: NONE SEEN

## 2014-01-22 MED ORDER — OXYCODONE-ACETAMINOPHEN 5-325 MG PO TABS
1.0000 | ORAL_TABLET | Freq: Once | ORAL | Status: AC
  Administered 2014-01-22: 1 via ORAL
  Filled 2014-01-22: qty 1

## 2014-01-22 MED ORDER — PROMETHAZINE HCL 25 MG PO TABS
25.0000 mg | ORAL_TABLET | Freq: Once | ORAL | Status: AC
  Administered 2014-01-22: 25 mg via ORAL
  Filled 2014-01-22: qty 1

## 2014-01-22 NOTE — MAU Note (Signed)
Pt states has had pain for past 2 weeks however in past two days, pain has worsened. Pain is constant. Pain is worse with movement.

## 2014-01-22 NOTE — Discharge Instructions (Signed)
Dolor abdominal en el embarazo  (Abdominal Pain During Pregnancy)  El dolor abdominal es frecuente durante el embarazo. Generalmente no causa ningún daño. El dolor abdominal puede tener numerosas causas. Algunas causas son más graves que otras. Ciertas causas de dolor abdominal durante el embarazo se diagnostican fácilmente. A veces, se tarda un tiempo para llegar al diagnóstico. Otras veces la causa no se conoce. El dolor abdominal puede estar relacionado con alguna alteración del embarazo, o puede deberse a una causa totalmente diferente. Por este motivo, siempre consulte a su médico cuando sienta molestias abdominales.  INSTRUCCIONES PARA EL CUIDADO EN EL HOGAR   Esté atenta al dolor para ver si hay cambios. Las siguientes indicaciones ayudarán a aliviar cualquier molestia que pueda sentir:  · No tenga relaciones sexuales y no coloque nada dentro de la vagina hasta que los síntomas hayan desaparecido completamente.  · Descanse todo lo que pueda hasta que el dolor se le haya calmado.  · Si siente náuseas, beba líquidos claros. Evite los alimentos sólidos mientras sienta malestar o tenga náuseas.  · Tome sólo medicamentos de venta libre o recetados, según las indicaciones del médico.  · Cumpla con todas las visitas de control, según le indique su médico.  SOLICITE ATENCIÓN MÉDICA DE INMEDIATO SI:  · Tiene un sangrado, pérdida de líquidos o elimina tejidos por la vagina.  · El dolor o los cólicos aumentan.  · Tiene vómitos persistentes.  · Comienza a sentir dolor al orinar u observa sangre.  · Tiene fiebre.  · Nota que los movimientos del bebé disminuyen.  · Siente intensa debilidad o se marea.  · Tiene dificultad para respirar con o sin dolor abdominal.  · Siente un dolor de cabeza intenso junto al dolor abdominal.  · Tiene una secreción vaginal anormal con dolor abdominal.  · Tiene diarrea persistente.  · El dolor abdominal sigue o empeora aún después de hacer reposo.  ASEGÚRESE DE QUE:   · Comprende estas  instrucciones.  · Controlará su afección.  · Recibirá ayuda de inmediato si no mejora o si empeora.  Document Released: 09/03/2005 Document Revised: 06/24/2013  Exit[employer]® Patient Information ©2014 [employer].

## 2014-01-22 NOTE — MAU Note (Signed)
Patient states she has had low back pain and a headache since yesterday. Was seen at [facility] ED for a cough and had a positive pregnancy test. States she has nausea, no vomiting. Denies bleeding or discharge.

## 2014-01-22 NOTE — MAU Provider Note (Signed)
History     CSN: [id number]  Arrival date and time: 01/22/14 1609   First Provider Initiated Contact with Patient 01/22/14 1650      Chief Complaint  Patient presents with  . Back Pain  . Headache  . Nausea   HPI Comments: Lorraine [name] 28 y.o. [redacted]w[redacted]d [id number] presents to MAU with abdominal pains that have been ongoing for 2 weeks. She describes pain as "6-7" on 1/10 scale. She was seen at [facility] on 5/7 for abdominal pains and low back  pains and diagnosed with UTI and started on Macrobid. She feels that is improving and the burning sensations are improved. There are no cultures backs as yet. She has had some nausea and no vomiting. She had negative GC/Chlamydia on 12/20/13. Blood type is O positive. LMP 11/27/13 and she is sure of that date because she had her Manuella Ghazi IUD pulled that day and she bled for 4 days. Spanish Interpreter present for all conversations.   Back Pain Associated symptoms include abdominal pain and headaches.  Headache  Associated symptoms include abdominal pain, back pain and nausea.      Past Medical History  Diagnosis Date  . Diverticulitis     Past Surgical History  Procedure Laterality Date  . Cholecystectomy    . Cesarean section      History reviewed. No pertinent family history.  History  Substance Use Topics  . Smoking status: Never Smoker   . Smokeless tobacco: Never Used  . Alcohol Use: No    Allergies: No Known Allergies  Prescriptions prior to admission  Medication Sig Dispense Refill  . albuterol (PROVENTIL HFA;VENTOLIN HFA) 108 (90 BASE) MCG/ACT inhaler Inhale 1-2 puffs into the lungs every 4 (four) hours as needed for wheezing or shortness of breath.  1 Inhaler  0  . Doxylamine-Pyridoxine 10-10 MG TBEC Take 2 tablets by mouth at bedtime as needed (morning sickness).  30 tablet  0  . guaiFENesin (ROBITUSSIN) 100 MG/5ML SOLN Take 5 mLs (100 mg total) by mouth every 6 (six) hours as needed for cough or to loosen phlegm.  1200 mL  0  .  loratadine (CLARITIN) 10 MG tablet Take 1 tablet (10 mg total) by mouth daily.  30 tablet  0  . nitrofurantoin, macrocrystal-monohydrate, (MACROBID) 100 MG capsule Take 1 capsule (100 mg total) by mouth 2 (two) times daily.  10 capsule  0    Review of Systems  Constitutional: Negative.   Eyes: Negative.   Respiratory: Negative.   Cardiovascular: Negative.   Gastrointestinal: Positive for nausea and abdominal pain.  Genitourinary: Negative.   Musculoskeletal: Positive for back pain.  Skin: Negative.   Neurological: Positive for headaches.  Psychiatric/Behavioral: Negative.    Physical Exam   Blood pressure 124/59, pulse 83, temperature 98.7 F (37.1 C), resp. rate 16, height 5' (1.524 m), weight 88.089 kg (194 lb 3.2 oz), last menstrual period 11/27/2013.  Physical Exam  Constitutional: She is oriented to person, place, and time. She appears well-developed and well-nourished. No distress.  HENT:  Head: Normocephalic and atraumatic.  Eyes: Pupils are equal, round, and reactive to light.  Cardiovascular: Normal rate and regular rhythm.   Respiratory: Effort normal and breath sounds normal.  GI: Soft. Bowel sounds are normal. She exhibits no distension. There is no tenderness. There is no rebound and no guarding.  Genitourinary:  Genital:External: negative Vaginal:small amount white discharge Cervix:Closed/ thick Bimanual:Nontender   Musculoskeletal: Normal range of motion.  Neurological: She is alert and oriented to person,  place, and time.  Skin: Skin is warm and dry.  Psychiatric: She has a normal mood and affect. Her behavior is normal. Judgment and thought content normal.   Results for orders placed during the hospital encounter of 01/22/14 (from the past 24 hour(s))  CBC     Status: Abnormal   Collection Time    01/22/14  5:07 PM      Result Value Ref Range   WBC 9.5  4.0 - 10.5 K/uL   RBC 3.94  3.87 - 5.11 MIL/uL   Hemoglobin 12.2  12.0 - 15.0 g/dL   HCT 35.7 (*)  36.0 - 46.0 %   MCV 90.6  78.0 - 100.0 fL   MCH 31.0  26.0 - 34.0 pg   MCHC 34.2  30.0 - 36.0 g/dL   RDW 12.3  11.5 - 15.5 %   Platelets 256  150 - 400 K/uL  HCG, QUANTITATIVE, PREGNANCY     Status: Abnormal   Collection Time    01/22/14  5:08 PM      Result Value Ref Range   hCG, Beta Chain, Quant, S 409 (*) <5 mIU/mL  WET PREP, GENITAL     Status: Abnormal   Collection Time    01/22/14  5:13 PM      Result Value Ref Range   Yeast Wet Prep HPF POC NONE SEEN  NONE SEEN   Trich, Wet Prep NONE SEEN  NONE SEEN   Clue Cells Wet Prep HPF POC NONE SEEN  NONE SEEN   WBC, Wet Prep HPF POC FEW (*) NONE SEEN   US Ob Comp Less 14 Wks  01/22/2014   CLINICAL DATA:  Pain for 2 weeks, worse last 2 days, quantitative beta HCG 409, last menstrual period 11/27/2013  EXAM: OBSTETRIC <14 WK [place] AND TRANSVAGINAL OB US  TECHNIQUE: Both transabdominal and transvaginal ultrasound examinations were performed for complete evaluation of the gestation as well as the maternal uterus, adnexal regions, and pelvic cul-de-sac. Transvaginal technique was performed to assess early pregnancy.  COMPARISON:  12/26/2013  FINDINGS: Intrauterine gestational sac: None visualized  Yolk sac:  Not applicable  Embryo:  Not applicable  Cardiac Activity: Not applicable  Maternal uterus/adnexae: Endometrium is thickened to 18 mm with multiple tiny cystic spaces within it. Right ovary demonstrates a simple cyst that measures 2 cm and another simple cyst that measures 18 mm. Left ovary not visualized. Prominent vessels noted in both adnexal regions. Trace free fluid identified in the pelvis.  IMPRESSION: Evidence of probable corpus luteum right ovary with endometrial thickening. No evidence of intrauterine gestation. No specific evidence of ectopic pregnancy. Differential diagnostic possibilities include early pregnancy too early to image, nonvisualized ectopic pregnancy, or missed spontaneous abortion. Recommend follow-up US in 10-14 days for  definitive diagnosis.   Electronically Signed   By: Skipper Cliche M.D.   On: 01/22/2014 19:16   US Ob Transvaginal  01/22/2014   CLINICAL DATA:  Pain for 2 weeks, worse last 2 days, quantitative beta HCG 409, last menstrual period 11/27/2013  EXAM: OBSTETRIC <14 WK [place] AND TRANSVAGINAL OB US  TECHNIQUE: Both transabdominal and transvaginal ultrasound examinations were performed for complete evaluation of the gestation as well as the maternal uterus, adnexal regions, and pelvic cul-de-sac. Transvaginal technique was performed to assess early pregnancy.  COMPARISON:  12/26/2013  FINDINGS: Intrauterine gestational sac: None visualized  Yolk sac:  Not applicable  Embryo:  Not applicable  Cardiac Activity: Not applicable  Maternal uterus/adnexae: Endometrium is thickened to  18 mm with multiple tiny cystic spaces within it. Right ovary demonstrates a simple cyst that measures 2 cm and another simple cyst that measures 18 mm. Left ovary not visualized. Prominent vessels noted in both adnexal regions. Trace free fluid identified in the pelvis.  IMPRESSION: Evidence of probable corpus luteum right ovary with endometrial thickening. No evidence of intrauterine gestation. No specific evidence of ectopic pregnancy. Differential diagnostic possibilities include early pregnancy too early to image, nonvisualized ectopic pregnancy, or missed spontaneous abortion. Recommend follow-up US in 10-14 days for definitive diagnosis.   Electronically Signed   By: Skipper Cliche M.D.   On: 01/22/2014 19:16    MAU Course  Procedures  MDM Wet prep, GC, Chlamydia, CBC, UA, U/S, Quant Percocet/ phenergan / pain down to "3"   Assessment and Plan   A: Early Pregnancy  P: Above orders Return in 48 hours for repeat BHCG Ectopic precautions  Connye Burkitt Lorraine [name] 01/22/2014, 4:59 PM

## 2014-01-23 LAB — URINE CULTURE

## 2014-01-23 LAB — GC/CHLAMYDIA PROBE AMP
CT Probe RNA: NEGATIVE
GC Probe RNA: NEGATIVE

## 2014-01-23 NOTE — MAU Provider Note (Signed)
Attestation of Attending Supervision of Advanced Practitioner (PA/CNM/NP): Evaluation and management procedures were performed by the Advanced Practitioner under my supervision and collaboration.  I have reviewed the Advanced Practitioner's note and chart, and I agree with the management and plan.  [name], MD [facility] Attending 01/23/2014 9:13 AM

## 2014-01-24 ENCOUNTER — Inpatient Hospital Stay (HOSPITAL_COMMUNITY)
Admission: AD | Admit: 2014-01-24 | Discharge: 2014-01-24 | Disposition: A | Discharge status: Home / Self Care | Payer: Self-pay | Source: Ambulatory Visit | Attending: Obstetrics & Gynecology | Admitting: Obstetrics & Gynecology

## 2014-01-24 ENCOUNTER — Telehealth (HOSPITAL_BASED_OUTPATIENT_CLINIC_OR_DEPARTMENT_OTHER): Payer: Self-pay | Admitting: Emergency Medicine

## 2014-01-24 DIAGNOSIS — O9989 Other specified diseases and conditions complicating pregnancy, childbirth and the puerperium: Principal | ICD-10-CM

## 2014-01-24 DIAGNOSIS — O99891 Other specified diseases and conditions complicating pregnancy: Secondary | ICD-10-CM | POA: Insufficient documentation

## 2014-01-24 DIAGNOSIS — O26899 Other specified pregnancy related conditions, unspecified trimester: Secondary | ICD-10-CM

## 2014-01-24 DIAGNOSIS — R109 Unspecified abdominal pain: Secondary | ICD-10-CM

## 2014-01-24 LAB — HCG, QUANTITATIVE, PREGNANCY: hCG, Beta Chain, Quant, S: 1197 m[IU]/mL — ABNORMAL HIGH (ref <5)

## 2014-01-24 NOTE — MAU Note (Signed)
Pt presents to MAU for repeat BHCG. States some lower abdominal cramping, denies any vaginal bleeding

## 2014-01-24 NOTE — Telephone Encounter (Signed)
Post ED Visit - Positive Culture Follow-up  Culture report reviewed by antimicrobial stewardship pharmacist: []  Lorraine [name], Pharm.D., BCPS [x]  Lorraine [name], Pharm.D., BCPS []  Lorraine [name], Pharm.D., BCPS []  Lorraine [name], Pharm.D., BCPS, AAHIVP []  Lorraine [name], Pharm.D., BCPS, AAHIVP []  Lorraine [name], Pharm.D.  Positive urine culture Treated with Macrobid, organism sensitive to the same and no further patient follow-up is required at this time.  [name] 01/24/2014, 10:01 AM

## 2014-01-24 NOTE — MAU Provider Note (Signed)
  History     CSN: [id number]  Arrival date and time: 01/24/14 1701   None     Chief Complaint  Patient presents with  . Follow-up   HPI  Pt is a 28 yo G5P2022 at [redacted]w[redacted]d wks IUP here for follow-up BHCG.  First seen on 01/22/14 for abdominal pain.  BHCG was 409, hgb 12.2, wet prep and GC/CT neg.  Ultrasound did not show IUP or adnexal mass.  Pt reports continued cramping, denies vaginal bleedign.    Past Medical History  Diagnosis Date  . Diverticulitis     Past Surgical History  Procedure Laterality Date  . Cholecystectomy    . Cesarean section      No family history on file.  History  Substance Use Topics  . Smoking status: Never Smoker   . Smokeless tobacco: Never Used  . Alcohol Use: No    Allergies: No Known Allergies  Prescriptions prior to admission  Medication Sig Dispense Refill  . acetaminophen (TYLENOL) 325 MG tablet Take 325 mg by mouth every 6 (six) hours as needed for fever.      Marland Kitchen albuterol (PROVENTIL HFA;VENTOLIN HFA) 108 (90 BASE) MCG/ACT inhaler Inhale 1-2 puffs into the lungs every 4 (four) hours as needed for wheezing or shortness of breath.  1 Inhaler  0  . Doxylamine-Pyridoxine 10-10 MG TBEC Take 2 tablets by mouth at bedtime as needed (morning sickness).  30 tablet  0  . guaiFENesin (ROBITUSSIN) 100 MG/5ML SOLN Take 5 mLs (100 mg total) by mouth every 6 (six) hours as needed for cough or to loosen phlegm.  1200 mL  0  . loratadine (CLARITIN) 10 MG tablet Take 1 tablet (10 mg total) by mouth daily.  30 tablet  0  . nitrofurantoin, macrocrystal-monohydrate, (MACROBID) 100 MG capsule Take 1 capsule (100 mg total) by mouth 2 (two) times daily.  10 capsule  0  . Prenatal Vit-Fe Fumarate-FA (PRENATAL MULTIVITAMIN) TABS tablet Take 1 tablet by mouth daily at 12 noon.        Review of Systems  Gastrointestinal: Positive for abdominal pain (cramping).  All other systems reviewed and are negative.  Physical Exam   Blood pressure 142/76, pulse 106, resp.  rate 18, last menstrual period 11/27/2013.  Physical Exam  Constitutional: She is oriented to person, place, and time. She appears well-developed and well-nourished. No distress.  HENT:  Head: Normocephalic.  Neck: Normal range of motion. Neck supple.  Neurological: She is alert and oriented to person, place, and time. She has normal reflexes.  Skin: Skin is warm and dry.    MAU Course  Procedures  Results for orders placed during the hospital encounter of 01/24/14 (from the past 24 hour(s))  HCG, QUANTITATIVE, PREGNANCY     Status: Abnormal   Collection Time    01/24/14  5:10 PM      Result Value Ref Range   hCG, Beta Chain, Quant, S 1197 (*) <5 mIU/mL   Consulted with Dr. Elonda Husky > obtain ultrasound in 7-10 days.   Assessment and Plan  28 yo G5P2022 at [redacted]w[redacted]d wks Pregnancy  Plan: Discharge to home Ultrasound in 7-10 days Reviewed ectopic precautions  Joelyn Oms 01/24/2014, 5:36 PM

## 2014-01-24 NOTE — Discharge Instructions (Signed)
Dolor abdominal en el embarazo  (Abdominal Pain During Pregnancy)  El dolor abdominal es frecuente durante el embarazo. Generalmente no causa ningún daño. El dolor abdominal puede tener numerosas causas. Algunas causas son más graves que otras. Ciertas causas de dolor abdominal durante el embarazo se diagnostican fácilmente. A veces, se tarda un tiempo para llegar al diagnóstico. Otras veces la causa no se conoce. El dolor abdominal puede estar relacionado con alguna alteración del embarazo, o puede deberse a una causa totalmente diferente. Por este motivo, siempre consulte a su médico cuando sienta molestias abdominales.  INSTRUCCIONES PARA EL CUIDADO EN EL HOGAR   Esté atenta al dolor para ver si hay cambios. Las siguientes indicaciones ayudarán a aliviar cualquier molestia que pueda sentir:  · No tenga relaciones sexuales y no coloque nada dentro de la vagina hasta que los síntomas hayan desaparecido completamente.  · Descanse todo lo que pueda hasta que el dolor se le haya calmado.  · Si siente náuseas, beba líquidos claros. Evite los alimentos sólidos mientras sienta malestar o tenga náuseas.  · Tome sólo medicamentos de venta libre o recetados, según las indicaciones del médico.  · Cumpla con todas las visitas de control, según le indique su médico.  SOLICITE ATENCIÓN MÉDICA DE INMEDIATO SI:  · Tiene un sangrado, pérdida de líquidos o elimina tejidos por la vagina.  · El dolor o los cólicos aumentan.  · Tiene vómitos persistentes.  · Comienza a sentir dolor al orinar u observa sangre.  · Tiene fiebre.  · Nota que los movimientos del bebé disminuyen.  · Siente intensa debilidad o se marea.  · Tiene dificultad para respirar con o sin dolor abdominal.  · Siente un dolor de cabeza intenso junto al dolor abdominal.  · Tiene una secreción vaginal anormal con dolor abdominal.  · Tiene diarrea persistente.  · El dolor abdominal sigue o empeora aún después de hacer reposo.  ASEGÚRESE DE QUE:   · Comprende estas  instrucciones.  · Controlará su afección.  · Recibirá ayuda de inmediato si no mejora o si empeora.  Document Released: 09/03/2005 Document Revised: 06/24/2013  Exit[employer]® Patient Information ©2014 [employer].

## 2014-01-28 ENCOUNTER — Ambulatory Visit (HOSPITAL_COMMUNITY)
Admission: RE | Admit: 2014-01-28 | Discharge: 2014-01-28 | Disposition: A | Discharge status: Home / Self Care | Payer: Self-pay | Source: Ambulatory Visit | Attending: Family | Admitting: Family

## 2014-01-28 DIAGNOSIS — N831 Corpus luteum cyst of ovary, unspecified side: Secondary | ICD-10-CM | POA: Insufficient documentation

## 2014-01-28 DIAGNOSIS — O26899 Other specified pregnancy related conditions, unspecified trimester: Secondary | ICD-10-CM

## 2014-01-28 DIAGNOSIS — O34599 Maternal care for other abnormalities of gravid uterus, unspecified trimester: Secondary | ICD-10-CM | POA: Insufficient documentation

## 2014-01-28 DIAGNOSIS — Z3689 Encounter for other specified antenatal screening: Secondary | ICD-10-CM | POA: Insufficient documentation

## 2014-01-28 DIAGNOSIS — R109 Unspecified abdominal pain: Secondary | ICD-10-CM

## 2014-02-05 ENCOUNTER — Inpatient Hospital Stay (HOSPITAL_COMMUNITY)
Admission: AD | Admit: 2014-02-05 | Discharge: 2014-02-05 | Disposition: A | Discharge status: Home / Self Care | Payer: Medicaid Other | Source: Ambulatory Visit | Attending: Obstetrics & Gynecology | Admitting: Obstetrics & Gynecology

## 2014-02-05 ENCOUNTER — Encounter (HOSPITAL_COMMUNITY): Payer: Self-pay | Admitting: *Deleted

## 2014-02-05 DIAGNOSIS — R109 Unspecified abdominal pain: Secondary | ICD-10-CM | POA: Insufficient documentation

## 2014-02-05 DIAGNOSIS — E860 Dehydration: Secondary | ICD-10-CM

## 2014-02-05 DIAGNOSIS — O211 Hyperemesis gravidarum with metabolic disturbance: Secondary | ICD-10-CM | POA: Insufficient documentation

## 2014-02-05 DIAGNOSIS — O210 Mild hyperemesis gravidarum: Secondary | ICD-10-CM

## 2014-02-05 DIAGNOSIS — O219 Vomiting of pregnancy, unspecified: Secondary | ICD-10-CM

## 2014-02-05 LAB — URINALYSIS, ROUTINE W REFLEX MICROSCOPIC
BILIRUBIN URINE: NEGATIVE
Glucose, UA: NEGATIVE mg/dL
HGB URINE DIPSTICK: NEGATIVE
KETONES UR: 15 mg/dL — AB
Leukocytes, UA: NEGATIVE
Nitrite: NEGATIVE
Protein, ur: NEGATIVE mg/dL
Specific Gravity, Urine: >1.03 — ABNORMAL HIGH (ref 1.005–1.030)
UROBILINOGEN UA: 0.2 mg/dL (ref 0.0–1.0)
pH: 6.5 (ref 5.0–8.0)

## 2014-02-05 LAB — COMPREHENSIVE METABOLIC PANEL
ALT: 27 U/L (ref 0–35)
AST: 25 U/L (ref 0–37)
Albumin: 3.8 g/dL (ref 3.5–5.2)
Alkaline Phosphatase: 73 U/L (ref 39–117)
BUN: 11 mg/dL (ref 6–23)
CALCIUM: 9.8 mg/dL (ref 8.4–10.5)
CO2: 23 mEq/L (ref 19–32)
CREATININE: 0.72 mg/dL (ref 0.50–1.10)
Chloride: 101 mEq/L (ref 96–112)
GFR calc Af Amer: >90 mL/min (ref >90)
GLUCOSE: 85 mg/dL (ref 70–99)
Potassium: 3.9 mEq/L (ref 3.7–5.3)
SODIUM: 137 meq/L (ref 137–147)
TOTAL PROTEIN: 7.4 g/dL (ref 6.0–8.3)
Total Bilirubin: 0.2 mg/dL — ABNORMAL LOW (ref 0.3–1.2)

## 2014-02-05 LAB — CBC
HCT: 36.6 % (ref 36.0–46.0)
Hemoglobin: 12.8 g/dL (ref 12.0–15.0)
MCH: 31.5 pg (ref 26.0–34.0)
MCHC: 35 g/dL (ref 30.0–36.0)
MCV: 90.1 fL (ref 78.0–100.0)
Platelets: 273 10*3/uL (ref 150–400)
RBC: 4.06 MIL/uL (ref 3.87–5.11)
RDW: 12.4 % (ref 11.5–15.5)
WBC: 8.9 10*3/uL (ref 4.0–10.5)

## 2014-02-05 MED ORDER — PROMETHAZINE HCL 12.5 MG PO TABS: 12.5000 mg | ORAL_TABLET | Freq: Four times a day (QID) | ORAL | Status: DC | PRN

## 2014-02-05 MED ORDER — LACTATED RINGERS IV BOLUS (SEPSIS)
1000.0000 mL | Freq: Once | INTRAVENOUS | Status: AC
  Administered 2014-02-05: 1000 mL via INTRAVENOUS

## 2014-02-05 MED ORDER — PROMETHAZINE HCL 25 MG/ML IJ SOLN
12.5000 mg | Freq: Once | INTRAMUSCULAR | Status: AC
  Administered 2014-02-05: 12.5 mg via INTRAVENOUS
  Filled 2014-02-05: qty 1

## 2014-02-05 NOTE — MAU Provider Note (Signed)
History     CSN: [id number]  Arrival date and time: 02/05/14 1506   First Provider Initiated Contact with Patient 02/05/14 1604      Chief Complaint  Patient presents with  . Abdominal Pain   HPI  [name] is a 28 y.o. female [id number] [redacted]w[redacted]d who presents with left sided abdominal pain. The pain started 2 days ago; the patient cant think of anything that brought the pain on. The pain is worse when she changes positions; the patient denies pain when she is laying down or lying still.  The pain is described as constant and not really strong. The patient has been vomiting for the past 2 days; each time the vomiting occurred only in the morning. The has had nothing to eat today; " the food gives me nausea". " I have had a little bit of water". The patient has not been taking anything for the nausea.  She has not started prenatal care; she has been referred to the clinic downstairs.  Pt has tried tylenol without relief.  Pt has had a normal [place] in this pregnancy.   OB History   Grav Para Term Preterm Abortions TAB SAB Ect Mult Living   5 2 2  2  2   2       Past Medical History  Diagnosis Date  . Diverticulitis     Past Surgical History  Procedure Laterality Date  . Cholecystectomy    . Cesarean section      No family history on file.  History  Substance Use Topics  . Smoking status: Never Smoker   . Smokeless tobacco: Never Used  . Alcohol Use: No    Allergies: No Known Allergies  Prescriptions prior to admission  Medication Sig Dispense Refill  . acetaminophen (TYLENOL) 325 MG tablet Take 325 mg by mouth every 6 (six) hours as needed for fever.      Marland Kitchen albuterol (PROVENTIL HFA;VENTOLIN HFA) 108 (90 BASE) MCG/ACT inhaler Inhale 1-2 puffs into the lungs every 4 (four) hours as needed for wheezing or shortness of breath.  1 Inhaler  0  . Doxylamine-Pyridoxine 10-10 MG TBEC Take 2 tablets by mouth at bedtime as needed (morning sickness).  30 tablet  0  .  guaiFENesin (ROBITUSSIN) 100 MG/5ML SOLN Take 5 mLs (100 mg total) by mouth every 6 (six) hours as needed for cough or to loosen phlegm.  1200 mL  0  . loratadine (CLARITIN) 10 MG tablet Take 1 tablet (10 mg total) by mouth daily.  30 tablet  0  . nitrofurantoin, macrocrystal-monohydrate, (MACROBID) 100 MG capsule Take 1 capsule (100 mg total) by mouth 2 (two) times daily.  10 capsule  0  . Prenatal Vit-Fe Fumarate-FA (PRENATAL MULTIVITAMIN) TABS tablet Take 1 tablet by mouth daily at 12 noon.       Results for orders placed during the hospital encounter of 02/05/14 (from the past 48 hour(s))  URINALYSIS, ROUTINE W REFLEX MICROSCOPIC     Status: Abnormal   Collection Time    02/05/14  3:10 PM      Result Value Ref Range   Color, Urine YELLOW  YELLOW   APPearance CLEAR  CLEAR   Specific Gravity, Urine >1.030 (*) 1.005 - 1.030   pH 6.5  5.0 - 8.0   Glucose, UA NEGATIVE  NEGATIVE mg/dL   Hgb urine dipstick NEGATIVE  NEGATIVE   Bilirubin Urine NEGATIVE  NEGATIVE   Ketones, ur 15 (*) NEGATIVE mg/dL   Protein, ur NEGATIVE  NEGATIVE mg/dL   Urobilinogen, UA 0.2  0.0 - 1.0 mg/dL   Nitrite NEGATIVE  NEGATIVE   Leukocytes, UA NEGATIVE  NEGATIVE   Comment: MICROSCOPIC NOT DONE ON URINES WITH NEGATIVE PROTEIN, BLOOD, LEUKOCYTES, NITRITE, OR GLUCOSE <1000 mg/dL.  CBC     Status: None   Collection Time    02/05/14  4:42 PM      Result Value Ref Range   WBC 8.9  4.0 - 10.5 K/uL   RBC 4.06  3.87 - 5.11 MIL/uL   Hemoglobin 12.8  12.0 - 15.0 g/dL   HCT 36.6  36.0 - 46.0 %   MCV 90.1  78.0 - 100.0 fL   MCH 31.5  26.0 - 34.0 pg   MCHC 35.0  30.0 - 36.0 g/dL   RDW 12.4  11.5 - 15.5 %   Platelets 273  150 - 400 K/uL    Review of Systems  Constitutional: Negative for fever and chills.  Gastrointestinal: Positive for nausea, vomiting and abdominal pain (+Left lower-left mid quadrant pain ). Negative for diarrhea and constipation.  Genitourinary: Negative for dysuria, urgency and frequency.       No  vaginal discharge. No vaginal bleeding. No dysuria.    Physical Exam   Blood pressure 114/54, pulse 76, temperature 98.6 F (37 C), resp. rate 18, last menstrual period 11/27/2013.  Physical Exam  Constitutional: She appears well-developed and well-nourished. No distress.  HENT:  Head: Normocephalic.  Eyes: Pupils are equal, round, and reactive to light.  Neck: Normal range of motion.  GI: There is tenderness in the left upper quadrant and left lower quadrant.    Skin: She is not diaphoretic.    MAU Course  Procedures None  MDM  LR bolus > Phenergan CBC CMP Report given to D. Analicia Skibinski CNM who resumes care of the patient.   Assessment and Plan   Lorraine Hillock Rasch, NP  02/05/2014, 4:04 PM   1830 feels better and retaining fluids; has not vomited Interpreter here.   Results for orders placed during the hospital encounter of 02/05/14 (from the past 24 hour(s))  URINALYSIS, ROUTINE W REFLEX MICROSCOPIC     Status: Abnormal   Collection Time    02/05/14  3:10 PM      Result Value Ref Range   Color, Urine YELLOW  YELLOW   APPearance CLEAR  CLEAR   Specific Gravity, Urine >1.030 (*) 1.005 - 1.030   pH 6.5  5.0 - 8.0   Glucose, UA NEGATIVE  NEGATIVE mg/dL   Hgb urine dipstick NEGATIVE  NEGATIVE   Bilirubin Urine NEGATIVE  NEGATIVE   Ketones, ur 15 (*) NEGATIVE mg/dL   Protein, ur NEGATIVE  NEGATIVE mg/dL   Urobilinogen, UA 0.2  0.0 - 1.0 mg/dL   Nitrite NEGATIVE  NEGATIVE   Leukocytes, UA NEGATIVE  NEGATIVE  CBC     Status: None   Collection Time    02/05/14  4:42 PM      Result Value Ref Range   WBC 8.9  4.0 - 10.5 K/uL   RBC 4.06  3.87 - 5.11 MIL/uL   Hemoglobin 12.8  12.0 - 15.0 g/dL   HCT 36.6  36.0 - 46.0 %   MCV 90.1  78.0 - 100.0 fL   MCH 31.5  26.0 - 34.0 pg   MCHC 35.0  30.0 - 36.0 g/dL   RDW 12.4  11.5 - 15.5 %   Platelets 273  150 - 400 K/uL  COMPREHENSIVE METABOLIC PANEL  Status: Abnormal   Collection Time    02/05/14  4:42 PM      Result  Value Ref Range   Sodium 137  137 - 147 mEq/L   Potassium 3.9  3.7 - 5.3 mEq/L   Chloride 101  96 - 112 mEq/L   CO2 23  19 - 32 mEq/L   Glucose, Bld 85  70 - 99 mg/dL   BUN 11  6 - 23 mg/dL   Creatinine, Ser 0.72  0.50 - 1.10 mg/dL   Calcium 9.8  8.4 - 10.5 mg/dL   Total Protein 7.4  6.0 - 8.3 g/dL   Albumin 3.8  3.5 - 5.2 g/dL   AST 25  0 - 37 U/L   ALT 27  0 - 35 U/L   Alkaline Phosphatase 73  39 - 117 U/L   Total Bilirubin 0.2 (*) 0.3 - 1.2 mg/dL   GFR calc non Af Amer >90  >90 mL/min   GFR calc Af Amer >90  >90 mL/min   ASSESSMENT: 1. Nausea and vomiting in pregnancy prior to [redacted] weeks gestation   2. Dehydration, mild   G5P2022 at [redacted]w[redacted]d  PLAN: Discussed anti-nausea measures. Will Rx Phenergan. Reassured re 2# weight loss, nl labs and abd pain probably MSK d/t vomiting,   Medication List         acetaminophen 325 MG tablet  Commonly known as:  TYLENOL  Take 325 mg by mouth every 6 (six) hours as needed for fever.     albuterol 108 (90 BASE) MCG/ACT inhaler  Commonly known as:  PROVENTIL HFA;VENTOLIN HFA  Inhale 1-2 puffs into the lungs every 4 (four) hours as needed for wheezing or shortness of breath.     Doxylamine-Pyridoxine 10-10 MG Tbec  Take 2 tablets by mouth at bedtime as needed (morning sickness).     loratadine 10 MG tablet  Commonly known as:  CLARITIN  Take 1 tablet (10 mg total) by mouth daily.     prenatal multivitamin Tabs tablet  Take 1 tablet by mouth daily at 12 noon.     promethazine 12.5 MG tablet  Commonly known as:  PHENERGAN  Take 1 tablet (12.5 mg total) by mouth every 6 (six) hours as needed for nausea or vomiting.       Follow-up Information   Follow up with [facility] On 02/16/2014. (Keep your scheduled prenatal appointment)    Contact information:   [address] [place] [address]

## 2014-02-05 NOTE — MAU Note (Signed)
Pt reports having ULQ/side pain that radiates towards the front for the past 2 days.Pain is constant and dull. C/o burning with urination. Felt feverish yesterday.Denies and vag bleeding or discharge.

## 2014-02-05 NOTE — MAU Provider Note (Signed)
Attestation of Attending Supervision of Advanced Practitioner (CNM/NP): Evaluation and management procedures were performed by the Advanced Practitioner under my supervision and collaboration.  I have reviewed the Advanced Practitioner's note and chart, and I agree with the management and plan.  Estel Tonelli Harraway-Smith 7:05 PM

## 2014-02-05 NOTE — Discharge Instructions (Signed)
Nuseas matinales (Morning Sickness) Se denominan nuseas matinales a las ganas de vomitar (nuseas) durante el Media planner. Esta sensacin puede estar acompaada o no de vmitos. Aparecen por la maana, pero puede ser un problema a lo largo de [employer]. Las [employer] son ms frecuentes Forensic scientist trimestre, Armed forces training and education officer pueden continuar durante todo el [place]. Aunque son molestas, generalmente no causan ningn dao, excepto que presente vmitos continuos e intensos (hiperemesis gravdica). Este problema requiere un tratamiento ms intenso.  CAUSAS  La causa de las nuseas matinales no se conoce completamente pero parecen estar relacionadas con los cambios hormonales que ocurren durante el [place]. FACTORES DE RIESGO Usted tendr mayor riesgo si:  Sufra de nuseas o vmitos antes de [place].  Tuvo nuseas matinales durante los embarazos previos.  Est embarazada de ms de un beb, por ejemplo mellizos. TRATAMIENTO  No utilice ningn medicamento (prescripto, de venta libre ni hierbas) para este problema sin consultar con su mdico. El mdico tambin podr Consulting civil engineer o recomendar:  Suplementos de vitamina B6.  Medicamentos para las nauseas  La medicina herbal llamada jengibre. [place] slo medicamentos de venta libre o recetados, segn las indicaciones del mdico.  Tomar un multivitamnico antes de Product/process development scientist puede prevenir o disminuir la gravedad de las nuseas matinales en la mayora de las mujeres.  Coma un trozo de [place] seca o una cracker sin sal antes de levantarse de la cama por la maana.  Coma 5 o 6 comidas pequeas por da.  Consuma alimentos blandos y secos (arroz, papas asadas). Los alimentos ricos en hidratos de carbono generalmente ayudan.  No  beba lquidos con las comidas. [place] comidas.  Evite los alimentos muy grasos o condimentados.  Pdale a otra persona que cocine para usted  si [employer] de algn alimento le provoca nuseas o vmitos.  Si tiene ganas de vomitar despus de tomar las vitaminas prenatales, tmelas a la noche o con una colacin.  Tome colaciones de alimentos proteicos entre comidas si siente apetito.  Coma gelatina sin azcar de postre.  Una pulsera de acupresin ( que se utiliza para Tree surgeon en viajes) puede ser de [place].  La acupuntura puede ayudarla.  No fume.  Consiga un humidificador para Medical sales representative de su casa libre de [place].  Trate de respirar aire fresco. SOLICITE ATENCIN MDICA SI:   Los remedios caseros no funcionan y Paediatric nurse.  Se siente mareada o sufre un desmayo.  Pierde peso. SOLICITE ATENCIN MDICA DE INMEDIATO SI:   Tiene nuseas y vmitos de manera persistente y no puede controlarlos.  Pierde el conocimiento (se desmaya). Document Released: 12/20/2008 Document Revised: 05/06/2013 [facility] Patient Information 2014 [place], [place].  Deshidratacin, Adulto (Dehydration, Adult) Se denomina deshidratacin cuando se pierden ms lquidos de los que se ingieren. Los rganos [employer] riones, el cerebro y el corazn no pueden funcionar sin la [place] cantidad de lquidos y Press photographer. Cualquier prdida de lquidos del organismo puede causar deshidratacin.  CAUSAS  Vmitos.  Diarrea.  Sudoracin excesiva.  Eliminacin excesiva de orina.  Cristy Hilts. SNTOMAS Deshidratacin leve  Sed.  Labios resecos.  Sequedad leve de la mucosa bucal. Deshidratacin moderada   La boca est muy seca.  Ojos hundidos.  La piel no vuelve rpidamente a su lugar cuando se suelta luego de pellizcarla ligeramente.  Elmon Else y disminucin de la produccin de [place].  Disminucin de la cantidad de lgrimas.  Dolor de [place]. Deshidratacin  grave  La boca est muy seca.  Sed extrema.  Pulso rpido y dbil (ms de 100 por minuto en reposo).  Manos y pies fros.  Prdida de la capacidad para  transpirar, independientemente del calor y la temperatura.  Respiracin rpida.  Labios azulados.  Confusin y Statistician.  Dificultad para despertarse.  Poca produccin de [place].  Falta de lgrimas. DIAGNSTICO El profesional diagnosticar deshidratacin basndose en los sntomas y el examen fsico. Las pruebas de sangre y [place] ayudarn a Firefighter el diagnstico. La evaluacin diagnstica suele identificar tambin las causas de la deshidratacin. [place] deshidratacin leve o moderada generalmente puede hacerse en el hogar mediante el aumento de la cantidad de los lquidos que se beben. Es mejor beber pequeas cantidades de lquidos con mayor frecuencia. Beber demasiado de una sola vez puede hacer que el vmito empeore. Siga las instrucciones para el cuidado en el hogar que se indican a continuacin.  La deshidratacin grave debe tratarse en el hospital en el que probablemente le administren lquidos por va intravenosa (IV) que contienen agua y Brewing technologist.  INSTRUCCIONES PARA EL CUIDADO DOMICILIARIO  Pida instrucciones especficas a su mdico con respecto a la rehidratacin.  Debe ingerir gran cantidad de lquido para mantener la orina de tono claro o color amarillo plido.  Beba pequeas cantidades con frecuencia si tiene nuseas y vmitos.  [place]imntese como lo hace normalmente.  Evite:  Alimentos o bebidas que Animal nutritionist.  Gaseosas.  Jugos.  Lquidos muy calientes o fros.  Bebidas con cafena.  Alimentos muy grasos.  Alcohol.  [place] Asters demasiado a la vez.  Postres de gelatina.  Lave bien sus manos para evitar las bacterias o los virus se diseminen.  Slo tome medicamentos de venta libre o recetados para Glass blower/designer, las molestias o bajar la fiebre segn las indicaciones de su mdico.  Consulte a su mdico si puede seguir tomando todos sus medicamentos recetados o de Radio broadcast assistant.  Concurra puntualmente a  las citas de control con el mdico. SOLICITE ATENCIN MDICA SI:  Tiene dolor abdominal y este aumenta o permanece en un rea (se localiza).  Aparece una erupcin, rigidez en el cuello o dolor de cabeza intensos.  Est irritable, somnoliento o le cuesta despertarse.  Se siente dbil, mareado tiene mucha sed. SOLICITE ATENCIN MDICA DE INMEDIATO SI:  No puede retener lquidos o empeora a pesar del tratamiento.  Tiene episodios frecuentes de vmitos o diarrea.  Observa sangre o una sustancia verde (bilis) en el vmito.  Lollie Marrow en la materia fecal, o las heces son negras y de aspecto alquitranado.  No ha orinado durante 6 a 8 horas, o slo ha [place] cantidad [place] de [place].  Tiene fiebre.  Se desmaya. ASEGRESE QUE:  Comprende estas instrucciones.  Controlar su enfermedad.  Solicitar ayuda inmediatamente si no mejora o si empeora. Document Released: 09/03/2005 Document Revised: 11/26/2011 [facility] Patient Information 2014 [place], [place].

## 2014-02-16 ENCOUNTER — Ambulatory Visit (INDEPENDENT_AMBULATORY_CARE_PROVIDER_SITE_OTHER): Payer: Medicaid Other | Admitting: Obstetrics and Gynecology

## 2014-02-16 ENCOUNTER — Encounter: Payer: Self-pay | Admitting: Obstetrics and Gynecology

## 2014-02-16 VITALS — BP 118/76 | HR 77 | Temp 96.9°F | Wt 192.3 lb

## 2014-02-16 DIAGNOSIS — Z331 Pregnant state, incidental: Secondary | ICD-10-CM

## 2014-02-16 LAB — POCT URINALYSIS DIP (DEVICE)
Bilirubin Urine: NEGATIVE
Glucose, UA: NEGATIVE mg/dL
KETONES UR: NEGATIVE mg/dL
LEUKOCYTES UA: NEGATIVE
Nitrite: NEGATIVE
PROTEIN: NEGATIVE mg/dL
SPECIFIC GRAVITY, URINE: 1.015 (ref 1.005–1.030)
Urobilinogen, UA: 0.2 mg/dL (ref 0.0–1.0)
pH: 7 (ref 5.0–8.0)

## 2014-02-16 NOTE — Progress Notes (Signed)
Patient here for new ob and reports certain LMP. Based on LMP patient should be 11 weeks. 5/14 ultrasound demonstrated a GS, yolk sac but no fetal pole. Patient reports some cramping and spotting on occasions. Bedside ultrasound with transabdominal probe demonstrates a gestational sac, yolk sac and fetal pole not visualized. Will schedule for viability ultrasound prior to initiating prenatal care. All questions were answered and patient verbalized understanding

## 2014-02-16 NOTE — Progress Notes (Signed)
Patient here for initial visit. Reports occasional cramping.

## 2014-02-19 ENCOUNTER — Ambulatory Visit (HOSPITAL_COMMUNITY)
Admission: RE | Admit: 2014-02-19 | Discharge: 2014-02-19 | Disposition: A | Discharge status: Home / Self Care | Payer: Self-pay | Source: Ambulatory Visit | Attending: Obstetrics and Gynecology | Admitting: Obstetrics and Gynecology

## 2014-02-19 DIAGNOSIS — Z331 Pregnant state, incidental: Secondary | ICD-10-CM

## 2014-02-19 DIAGNOSIS — Z3689 Encounter for other specified antenatal screening: Secondary | ICD-10-CM | POA: Insufficient documentation

## 2014-02-19 DIAGNOSIS — O34599 Maternal care for other abnormalities of gravid uterus, unspecified trimester: Secondary | ICD-10-CM | POA: Insufficient documentation

## 2014-02-19 DIAGNOSIS — N83209 Unspecified ovarian cyst, unspecified side: Secondary | ICD-10-CM | POA: Insufficient documentation

## 2014-02-19 DIAGNOSIS — O3410 Maternal care for benign tumor of corpus uteri, unspecified trimester: Secondary | ICD-10-CM | POA: Insufficient documentation

## 2014-02-19 DIAGNOSIS — D259 Leiomyoma of uterus, unspecified: Secondary | ICD-10-CM | POA: Insufficient documentation

## 2014-02-27 ENCOUNTER — Encounter (HOSPITAL_COMMUNITY): Payer: Self-pay | Admitting: Emergency Medicine

## 2014-02-27 ENCOUNTER — Emergency Department (HOSPITAL_COMMUNITY)
Admission: EM | Admit: 2014-02-27 | Discharge: 2014-02-27 | Disposition: A | Discharge status: Home / Self Care | Payer: Self-pay | Attending: Emergency Medicine | Admitting: Emergency Medicine

## 2014-02-27 DIAGNOSIS — M549 Dorsalgia, unspecified: Secondary | ICD-10-CM | POA: Insufficient documentation

## 2014-02-27 DIAGNOSIS — Z8719 Personal history of other diseases of the digestive system: Secondary | ICD-10-CM | POA: Insufficient documentation

## 2014-02-27 DIAGNOSIS — R1013 Epigastric pain: Secondary | ICD-10-CM | POA: Insufficient documentation

## 2014-02-27 DIAGNOSIS — R51 Headache: Secondary | ICD-10-CM | POA: Insufficient documentation

## 2014-02-27 DIAGNOSIS — O210 Mild hyperemesis gravidarum: Secondary | ICD-10-CM | POA: Insufficient documentation

## 2014-02-27 DIAGNOSIS — Z79899 Other long term (current) drug therapy: Secondary | ICD-10-CM | POA: Insufficient documentation

## 2014-02-27 DIAGNOSIS — O9989 Other specified diseases and conditions complicating pregnancy, childbirth and the puerperium: Secondary | ICD-10-CM | POA: Insufficient documentation

## 2014-02-27 LAB — URINALYSIS, ROUTINE W REFLEX MICROSCOPIC
Bilirubin Urine: NEGATIVE
GLUCOSE, UA: NEGATIVE mg/dL
Hgb urine dipstick: NEGATIVE
KETONES UR: NEGATIVE mg/dL
LEUKOCYTES UA: NEGATIVE
Nitrite: NEGATIVE
Protein, ur: NEGATIVE mg/dL
SPECIFIC GRAVITY, URINE: 1.029 (ref 1.005–1.030)
Urobilinogen, UA: 0.2 mg/dL (ref 0.0–1.0)
pH: 7 (ref 5.0–8.0)

## 2014-02-27 MED ORDER — ONDANSETRON HCL 4 MG PO TABS: 4.0000 mg | ORAL_TABLET | Freq: Four times a day (QID) | ORAL | Status: DC

## 2014-02-27 MED ORDER — RANITIDINE HCL 150 MG PO TABS: 150.0000 mg | ORAL_TABLET | Freq: Two times a day (BID) | ORAL | Status: DC

## 2014-02-27 NOTE — ED Notes (Signed)
Patient is being seen at [facility] hospital for her prenatal care. She states this is like her previous pregnancies.

## 2014-02-27 NOTE — ED Notes (Signed)
Family at bedside. 

## 2014-02-27 NOTE — ED Notes (Signed)
Pt arrives with c/o back pain, headache, back pain and abdominal pain. Pt is [redacted] weeks pregnant. States she had pain similar to this with previous pregnancies. Pain has gotten worse over the past day.

## 2014-02-27 NOTE — Discharge Instructions (Signed)
Náuseas matinales  ( Morning Sickness)  Se denominan náuseas matinales a las ganas de vomitar (náuseas) durante el embarazo. Comienza a sentir náuseas y devuelve (vomita). Siente náuseas por la mañQuinteria, pero puede continuar sintiéndolas durante todo el día. Algunas mujeres experimentan náuseas intensas y no pueden detener los vómitos (hiperemesis gravídica).  CUIDADOS EN EL HOGAR  · Tome sólo los medicamentos según le haya indicado el médico.  · Tome las multivitaminas como le indicó el médico. Tomar multivitaminas antes de quedar embarazada puede detener o disminuir el malestar de las náuseas matinales.  · Coma un trozo de tostada seca o crackers sin sal antes de levantarse de la cama por la mañLashannon.  · Coma 5 o 6 comidas pequeñas por día.  · Consuma alimentos blandos y secos como arroz y patatas asadas.  · No  beba líquidos con las comidas. Tome líquidos entre las comidas.  · No coma alimentos fritos, grasos o muy condimentados.  · Pídale a alguna persona que cocine para usted, si el olor de las comidas le da náuseas o ganas de vomitar.  · Si tiene ganas de vomitar después de tomar las vitaminas prenatales, tómelas a la noche o con una colación.  · Consuma proteínas cuando necesite una colación (frutas secas, yogur, queso).  · Coma gelatina sin azúcar de postre.  · Use un brazalete de los usados para el mareo marítimo (pulsera de acupresión).  · Vaya a un especialista en colocar agujas en puntos seleccionados del cuerpo (acupuntura)para sentirse mejor.  · No fume.  · Consiga un humidificador para mantener el aire de su casa libre de olores.  · Trate de respirar aire fresco.  SOLICITE AYUDA SI:  · Necesita medicamentos para sentirse mejor.  · Se siente mareada o sufre un desmayo.  · Pierde peso.  SOLICITE AYUDA DE INMEDIATO SI:   · Tiene malestar estomacal y no puede dejar de vomitar.  · Pierde el conocimiento (se desmaya).  Document Released: 09/03/2005 Document Revised: 05/06/2013  ExitCare® Patient Information  ©2014 ExitCare, LLC.

## 2014-02-27 NOTE — ED Provider Notes (Signed)
CSN: [id number]     Arrival date & time 02/27/14  1941 History   First MD Initiated Contact with Patient 02/27/14 2009     Chief Complaint  Patient presents with  . Back Pain  . Headache  . Abdominal Pain     (Consider location/radiation/quality/duration/timing/severity/associated sxs/prior Treatment) HPI Comments: Pt for the last week has had headache, epigastric pain, nausea and back pain.  She denies dysuria, pelvic pain, vaginal bleeding. She is [redacted] weeks pregnant with a known IUP based on an ultrasound [facility]. She denies fever, neck stiffness or other complaints. She currently is taking prenatal vitamins.  The history is provided by the patient. The history is limited by a language barrier. A language interpreter was used.    Past Medical History  Diagnosis Date  . Diverticulitis    Past Surgical History  Procedure Laterality Date  . Cholecystectomy    . Cesarean section     Family History  Problem Relation Age of Onset  . Hyperlipidemia Father    History  Substance Use Topics  . Smoking status: Never Smoker   . Smokeless tobacco: Never Used  . Alcohol Use: No   OB History   Grav Para Term Preterm Abortions TAB SAB Ect Mult Living   5 2 2  2  2   2      Review of Systems  All other systems reviewed and are negative.     Allergies  Review of patient's allergies indicates no known allergies.  Home Medications   Prior to Admission medications   Medication Sig Start Date End Date Taking? Authorizing Provider  acetaminophen (TYLENOL) 325 MG tablet Take 325 mg by mouth every 6 (six) hours as needed for fever.   Yes Historical Provider, MD  Prenatal Vit-Fe Fumarate-FA (PRENATAL MULTIVITAMIN) TABS tablet Take 1 tablet by mouth daily at 12 noon.   Yes Historical Provider, MD  promethazine (PHENERGAN) 12.5 MG tablet Take 1 tablet (12.5 mg total) by mouth every 6 (six) hours as needed for nausea or vomiting. 02/05/14  Yes Deirdre C Poe, CNM   BP  112/58  Pulse 90  Temp(Src) 98.5 F (36.9 C) (Oral)  Resp 14  SpO2 100%  LMP 11/27/2013 Physical Exam  Nursing note and vitals reviewed. Constitutional: She is oriented to person, place, and time. She appears well-developed and well-nourished. No distress.  HENT:  Head: Normocephalic and atraumatic.  Mouth/Throat: Oropharynx is clear and moist.  Eyes: Conjunctivae and EOM are normal. Pupils are equal, round, and reactive to light.  Neck: Normal range of motion. Neck supple. No spinous process tenderness and no muscular tenderness present.  Cardiovascular: Normal rate, regular rhythm and intact distal pulses.   No murmur heard. Pulmonary/Chest: Effort normal and breath sounds normal. No respiratory distress. She has no wheezes. She has no rales.  Abdominal: Soft. She exhibits no distension. There is tenderness in the epigastric area. There is no rebound, no guarding and no CVA tenderness.  Musculoskeletal: Normal range of motion. She exhibits no edema and no tenderness.  Neurological: She is alert and oriented to person, place, and time.  Skin: Skin is warm and dry. No rash noted. No erythema.  Psychiatric: She has a normal mood and affect. Her behavior is normal.    ED Course  Procedures (including critical care time) Labs Review Labs Reviewed  URINALYSIS, ROUTINE W REFLEX MICROSCOPIC - Abnormal; Notable for the following:    APPearance CLOUDY (*)    All other components within  normal limits    Imaging Review No results found.   EKG Interpretation None      MDM   Final diagnoses:  Morning sickness    Patient here with no IUP at approximately 9 weeks complaining of mild back pain, epigastric pain, headache, nausea. She denies any fever and has no focal abdominal pain except mild epigastric pain on exam. She states she felt similar symptoms early on in her prior 2 pregnancies. She denies any vaginal bleeding. Bedside ultrasound shows an IUP with visualized heart flicker.   Urine without signs of infection feel patient's symptoms are most likely related to morning sickness. She was given Zofran and Zantac and instructed to followup with [facility] for [facility] care    Blanchie Dessert, MD 02/27/14 2209

## 2014-03-02 ENCOUNTER — Encounter: Payer: Self-pay | Admitting: Obstetrics and Gynecology

## 2014-03-02 ENCOUNTER — Other Ambulatory Visit (HOSPITAL_COMMUNITY): Payer: Self-pay

## 2014-03-02 ENCOUNTER — Ambulatory Visit (INDEPENDENT_AMBULATORY_CARE_PROVIDER_SITE_OTHER): Payer: Self-pay | Admitting: Obstetrics and Gynecology

## 2014-03-02 VITALS — BP 115/71 | HR 86 | Temp 98.6°F | Wt 191.1 lb

## 2014-03-02 DIAGNOSIS — Z3481 Encounter for supervision of other normal pregnancy, first trimester: Secondary | ICD-10-CM

## 2014-03-02 DIAGNOSIS — O09899 Supervision of other high risk pregnancies, unspecified trimester: Secondary | ICD-10-CM

## 2014-03-02 DIAGNOSIS — O09219 Supervision of pregnancy with history of pre-term labor, unspecified trimester: Secondary | ICD-10-CM

## 2014-03-02 DIAGNOSIS — O3421 Maternal care for scar from previous cesarean delivery: Secondary | ICD-10-CM

## 2014-03-02 DIAGNOSIS — Z3480 Encounter for supervision of other normal pregnancy, unspecified trimester: Secondary | ICD-10-CM

## 2014-03-02 DIAGNOSIS — O34219 Maternal care for unspecified type scar from previous cesarean delivery: Secondary | ICD-10-CM | POA: Insufficient documentation

## 2014-03-02 LAB — POCT URINALYSIS DIP (DEVICE)
Glucose, UA: NEGATIVE mg/dL
Hgb urine dipstick: NEGATIVE
Leukocytes, UA: NEGATIVE
Nitrite: NEGATIVE
Protein, ur: 30 mg/dL — AB
Specific Gravity, Urine: >=1.03 (ref 1.005–1.030)
Urobilinogen, UA: 0.2 mg/dL (ref 0.0–1.0)
pH: 6 (ref 5.0–8.0)

## 2014-03-02 LAB — OB RESULTS CONSOLE GBS: STREP GROUP B AG: NEGATIVE

## 2014-03-02 NOTE — Progress Notes (Signed)
   Subjective:    Lorraine [name] is a [id number] [redacted]w[redacted]d being seen today for her first obstetrical visit.  Her obstetrical history is significant for 2 prior cesarean sections and history of preterm delivery at 27 weeks. Patient reports that she was told that there was a nuchal cord at time of an ultrasound which prompted preterm delivery of her first born who weighted 6 lbs. Her second child was born by repeat cesarean section at full term. Patient does intend to breast feed. Pregnancy history fully reviewed.  Patient reports no complaints.  Filed Vitals:   03/02/14 0801  BP: 115/71  Pulse: 86  Temp: 98.6 F (37 C)  Weight: 191 lb 1.6 oz (86.682 kg)    HISTORY: OB History  Gravida Para Term Preterm AB SAB TAB Ectopic Multiple Living  5 2 1 1 2 2    2     # Outcome Date GA Lbr Len/2nd Weight Sex Delivery Anes PTL Lv  5 CUR           4 TRM 05/19/08     CS     3 PRE 02/25/05 [redacted]w[redacted]d    CS     2 SAB           1 SAB              Past Medical History  Diagnosis Date  . Diverticulitis    Past Surgical History  Procedure Laterality Date  . Cholecystectomy    . Cesarean section     Family History  Problem Relation Age of Onset  . Hyperlipidemia Father   . Diabetes Father      Exam    Uterus:     Pelvic Exam:    Perineum: Normal Perineum   Vulva: normal   Vagina:  normal mucosa, normal discharge   pH:    Cervix: closed and long   Adnexa: no mass, fullness, tenderness   Bony Pelvis: android  System: Breast:  normal appearance, no masses or tenderness   Skin: normal coloration and turgor, no rashes    Neurologic: oriented, no focal deficits   Extremities: normal strength, tone, and muscle mass   HEENT extra ocular movement intact   Mouth/Teeth mucous membranes moist, pharynx normal without lesions, good dentition   Neck supple and no masses   Cardiovascular: regular rate and rhythm   Respiratory:  chest clear, no wheezing, crepitations, rhonchi, normal symmetric air  entry   Abdomen: soft, non-tender; bowel sounds normal; no masses,  no organomegaly   Urinary:       Assessment:    Pregnancy: [id number] Patient Active Problem List   Diagnosis Date Noted  . Previous cesarean section complicating pregnancy 01/60/1093  . Supervision of other normal pregnancy, antepartum 03/02/2014  . Diverticulitis 09/10/2012  . Abdominal pain 09/09/2012  . Nausea and vomiting 09/09/2012  . Dehydration 09/09/2012  . IUD contraception 09/09/2012        Plan:     Initial labs drawn. Prenatal vitamins. Problem list reviewed and updated. Genetic Screening discussed First Screen: requested.  Ultrasound discussed; fetal survey: requested.  Follow up in 4 weeks. 50% of 30 min visit spent on counseling and coordination of care.     Lorraine [name] 03/02/2014

## 2014-03-02 NOTE — Patient Instructions (Signed)
Embarazo  Primer trimestre  (Pregnancy - First Trimester)  Durante el acto sexual, millones de espermatozoides entran en la vagina. Slo 1 espermatozoide penetra y fertiliza al vulo mientras se encuentra en la trompa de [place]opio. Una semana ms tarde, el vulo fertilizado se implanta en la pared del tero. Un embrin comienza a desarrollarse para ser un beb. A las 6 a 8 semanas se forman los ojos y la cara y los latidos del corazn se pueden ver en la ecografa. Al final de las 12 semanas (primer trimestre) todos los rganos del beb estn formados. Ahora que est embarazada, querr hacer todo lo que est a su alcance para tener un beb sano. Dos de las cosas ms importantes son: Lucilla Edin buena atencin prenatal y seguir las indicaciones del profesional que la asiste. La atencin prenatal incluye toda la asistencia mdica que usted recibe antes del nacimiento del beb. Se lleva a cabo para prevenir y tratar problemas durante el embarazo y [place]. [place] visitas prenatales se ONEOK, la presin arterial y se solicitan anlisis de [place]. Esto se hace para asegurarse de que usted est sana y el embarazo progrese normalmente.  Una mujer Adelino 25 a Emerson. Sin embargo, si usted tiene sobrepeso o [place], su mdico Chartered certified accountant.  El podr hacerle preguntas y responder todas las que usted le haga.  Durante los exmenes prenatales se solicitan anlisis de [place], cultivos del [place], un [place] y otros anlisis necesarios. Estas pruebas se realizan para controlar su salud y la del beb. Se recomienda que se haga la prueba para el diagnstico del VIH, con su autorizacin. Este es el virus que causa el Tripp. Estas pruebas se realizan porque existen medicamentos que podran administrarle para prevenir que el beb nazca con esta infeccin, si usted estuviera infectada y no lo supiera. Los C.H. Robinson Worldwide de sangre  tambin se Insurance underwriter para Actor tipo de [place], si tuvo infecciones previas y Chief Technology Officer sus niveles en la sangre (hemoglobina).  Tener un recuento bajo de hemoglobina (anemia) es comn durante Water quality scientist. Para prevenirla, se administran hierro y vitaminas. En una etapa ms avanzada del [place], le indicarn exmenes de sangre para saber si tiene diabetes, junto con otros anlisis, en caso de que Presidio.  Es necesario que se haga las pruebas para asegurarse de que usted y el beb estn bien. CAMBIOS DURANTE EL PRIMER TRIMESTRE  Su organismo atravesar numerosos cambios durante el [place]. Estos pueden variar de Ardelia Mems persona a otra. Converse con el mdico acerca los cambios que usted nota y que la preocupan. Ellos son:   El perodo menstrual se detiene.  El vulo y los espermatozoides llevan los genes que determinan cmo seremos. Sus genes y los de su pareja forman el beb. Los genes del varn determinan si ser un nio o una nia.  La circunferencia de la cintura va a ir aumentando y podr sentirse hinchada.  Puede tener Higher education careers adviser (nuseas) y vmitos. Si no puede controlar los vmitos, consulte a su mdico.  Sus mamas comenzarn a agrandarse y sensibilizarse.  Los pezones pueden sobresalir ms y ser ms oscuros.  Tendr necesidad de orinar ms. El dolor al orinar puede significar que usted tiene una infeccin de la vejiga.  Se cansar con facilidad.  Prdida del apetito.  Sentir un fuerte deseo de consumir ciertos alimentos.  Al principio, usted puede ganar o perder un par de kilos.  Podr tener cambios emocionales de un da a otro (entusiasmo por estar embarazada o preocupacin por Water quality scientist y el beb).  Tendr sueos ms vvidos y extraos. INSTRUCCIONES PARA EL CUIDADO EN EL HOGAR   Es muy importante evitar el cigarrillo, el alcohol y los frmacos no recetados Solicitor. Estas sustancias afectan la formacin y el desarrollo del beb.  Evite los productos qumicos durante el embarazo para Tourist information centre manager salud del beb.  Comience las consultas prenatales alrededor de la 12 semana de [place]. Generalmente se programan cada mes al principio y se hacen ms frecuentes en los 2 ltimos meses antes del parto. Cumpla con las citas de control. Siga las indicaciones del mdico con respecto al uso de [place], los anlisis y pruebas de [place], los ejercicios y Engineer, technical sales.  Durante el embarazo debe obtener nutrientes para usted y para su beb. Consuma alimentos balanceados. Elija alimentos como carne, pescado, [place] y otros productos lcteos descremados, vegetales, frutas, panes integrales y cereales. El Viacom informar cul es el aumento de peso ideal.  Las nuseas matinales pueden aliviarse si come algunas galletitas saladas en la cama. Coma dos galletitas antes de levantarse por la maana. Tambin puede comer galletitas sin sal.  Hacer 4 o 5 comidas pequeas en lugar de 3 comidas grandes por da tambin puede Kinder Morgan Energy nuseas y los vmitos.  Beber lquidos Lehman Brothers comidas en lugar de tomarlos durante las comidas tambin puede ayudar a Actor las nuseas y los vmitos.  Puede continuar teniendo Office Depot durante todo el embarazo si no hay otros problemas. Los problemas pueden ser una prdida precoz (prematura) de lquido amnitico, sangrado vaginal, o dolor en el vientre (abdominal).  [place], si no tiene restricciones. Consulte con su mdico o terapeuta fsico si no est segura de algunos de sus ejercicios. El mayor aumento de peso se producir en los ltimos 2 trimestres del Media planner. El ejercicio le ayudar a:  Engineer, technical sales.  Mantenerse en forma.  Prepararse para el parto.  La ayudar a perder el peso del embarazo despus de que nazca su beb.  Use un buen sostn o como los que se usan para hacer deportes para Public house manager la sensibilidad de las [place]. Tambin puede serle  til si lo [place] mientras duerme.  Consulte cuando puede comenzar con las clases de pre parto. Comience con las clases cuando estn disponibles.  No utilice la baera con agua caliente, baos turcos y saunas.   Colquese el cinturn de seguridad cuando conduzca. Este la proteger a usted y al beb en caso de accidente.  Evite comer carne cruda y el contacto con los utensilios y desperdicios de los gatos. Estos elementos contienen grmenes que pueden causar defectos de nacimiento en el beb.  El primer trimestre es un buen momento para visitar a su dentista y Leisure centre manager. Es importante mantener los dientes limpios. Use un cepillo de dientes suave y cepllese con ms suavidad durante el Solectron Corporation.  Pida ayuda si tienen necesidades financieras, teraputicas o nutricionales. El profesional podr ayudarla con respecto a estas necesidades, o derivarla a otros especialistas.  No tome medicamentos o hierbas excepto aquellos que Firefighter.  Informe a su mdico si sufre violencia familiar mental o fsica.  Haga una lista de nmeros de telfono de Freight forwarder de la familia, los amigos, el hospital y los departamentos de polica y bomberos.  Escriba sus preguntas. Llvelas cuando concurra a su visita prenatal.  No se  haga duchas vaginales.  No cruce las piernas.  Si usted tiene que estar parada por largos perodos de [place], gire los pies o de pequeos pasos en crculo.  Es posible que tenga ms secreciones vaginales que puedan requerir una toalla higinica. No use tampones o toallas higinicas perfumadas. EL CONSUMO [place] vitaminas para la etapa prenatal tal como se le indic. Las vitaminas deben contener un miligramo de cido flico. Guarde todas las vitaminas fuera del alcance de los nios. La ingestin de slo un par de vitaminas o tabletas que contengan hierro pueden ocasionar la American Electric Power en un beb o en un nio  pequeo.  Evite el uso de Unisys Corporation, incluyendo hierbas, medicamentos de [place], sin receta o que no hayan sido sugeridos por su mdico. Slo tome medicamentos de venta libre o medicamentos recetados para Conservation officer, historic buildings, Health and safety inspector o fiebre como lo indique su mdico. No tome aspirina, ibuprofeno o naproxeno excepto que su mdico se lo indique.  Infrmele al profesional si consume medicamentos de hierbas.  El alcohol se relaciona con ciertos defectos congnitos. Incluye el sndrome de alcoholismo fetal. Debe evitar absolutamente el consumo de alcohol, en cualquier forma. El fumar causar baja tasa de natalidad y bebs prematuros.  Las drogas ilegales o de la calle son muy perjudiciales para el beb. Estn absolutamente prohibidas. Un beb que nace de Safeco Corporation, ser adicto al nacer. Ese beb tendr los mismos sntomas de abstinencia que un adulto.  Informe a su mdico acerca de los medicamentos que ha tomado y el motivo por el que los tom. SOLICITE ATENCIN MDICA SI:  Tiene preguntas o preocupaciones relacionadas con el embarazo. Es mejor que llame para formular las preguntas si no puede esperar hasta la prxima visita, que sentirse preocupada por ellas.  SOLICITE ATENCIN MDICA DE INMEDIATO SI:   La temperatura oral le sube a ms de 38,9 C (102 F) o lo que su mdico le indique.  Tiene una prdida de lquido por la vagina (canal de parto). Si sospecha una ruptura de las [place], tmese la temperatura y llame al profesional para informarlo sobre esto.  Observa unas pequeas manchas o una hemorragia vaginal. Notifique al profesional acerca de la cantidad y de cuntos apsitos est utilizando.  Presenta un olor desagradable en la secrecin vaginal y observa un cambio en el color.  Contina con las nuseas y no obtiene alivio de los remedios indicados. Vomita sangre o algo similar a la borra del caf.  Pierde ms de 2 libras (1 Kg) en una semana.  Aumenta ms de 1 Kg  en una semana y nota el rostro, las manos, los pies o las piernas hinchados.  Aumenta ms de 2,5 Kg en una semana (aunque no tenga las manos, pies, piernas o el rostro hinchados).  Ha estado expuesta a la rubola y no ha sufrido la enfermedad.  Ha estado expuesta a la quinta enfermedad o a la varicela.  Siente dolor en el vientre (abdominal). Las BlueLinx en el ligamento redondo son Ardelia Mems causa benigna frecuente de dolor abdominal durante el embarazo. El profesional que la asiste deber evaluarla.  Presenta dolor de Doreene Nest, diarrea, dolor al orinar o le falta la respiracin.  Se cae, se ve involucrada en un accidente automovilstico o sufre algn tipo de traumatismo.  En su hogar hay violencia mental o fsica. Document Released: 06/13/2005 Document Revised: 05/28/2012 [facility] Patient Information 2014 [place], [place].  Lactancia materna (Breastfeeding) Decidir  amamantar es una de las mejores elecciones que puede hacer por usted y su beb. El cambio hormonal durante el Media planner produce el desarrollo del tejido mamario y [place] la cantidad y el tamao de los conductos galactforos. Estas hormonas tambin permiten que las protenas, los azcares y las grasas de la sangre produzcan la Northeast Utilities materna en las glndulas productoras de [place]. Las hormonas impiden que la leche materna sea liberada antes del nacimiento del beb, adems de impulsar el flujo de leche luego del nacimiento. Una vez que ha comenzado a Economist, Freight forwarder beb, as Therapist, occupational succin o Social research officer, government, pueden estimular la liberacin de [place] de las glndulas productoras de [place].  LOS BENEFICIOS DE AMAMANTAR Para el beb  La primera leche (calostro) ayuda al mejor funcionamiento del sistema digestivo del beb.  La leche tiene anticuerpos que ayudan a Chemical engineer las infecciones en el beb.  El beb tiene una menor incidencia de asma, alergias y del sndrome de muerte sbita del lactante.  Los nutrientes en la [place]  materna son mejores para el beb que la [place] maternizada y estn preparados exclusivamente para cubrir las necesidades del beb.  La leche materna mejora el desarrollo cerebral del beb.  Es menos probable que el beb desarrolle otras enfermedades, como obesidad infantil, asma o diabetes mellitus de tipo 2. Para usted   La lactancia materna favorece el desarrollo de un vnculo muy especial entre la madre y el beb.  Es conveniente. Siempre est disponible a la temperatura correcta y es [place].  La lactancia materna ayuda a quemar caloras y a perder el peso ganado durante el [place].  Favorece la contraccin del tero al tamao que tena antes del embarazo de manera ms rpida y disminuye el sangrado (loquios) despus del parto.  La lactancia materna contribuye a reducir Catering manager de desarrollar diabetes mellitus de tipo 2, osteoporosis o cncer de mama o de ovario en el futuro. SIGNOS DE QUE EL BEB EST HAMBRIENTO Primeros signos de hambre  Aumenta su estado de [place].  Se estira.  Mueve la cabeza de un lado a otro.  Mueve la cabeza y abre la boca cuando se le toca la mejilla o la comisura de la boca (reflejo de bsqueda).  Kemmerer vocalizaciones, tales como sonidos de succin, se relame los labios, emite arrullos, suspiros, o chirridos.  Mueve la [employer]s [employer] Stores boca.  Se chupa con ganas los dedos o las manos. Signos tardos de Hartford Financial.  Llora de manera intermitente. Signos de BJ's Wholesale signos de hambre extrema requerirn que lo calme y lo consuele antes de que el beb pueda alimentarse adecuadamente. No espere a que se manifiesten los siguientes signos de hambre extrema para comenzar a Economist:   Air cabin crew.  Llanto intenso y fuerte.   Gritos. INFORMACIN BSICA SOBRE LA LACTANCIA MATERNA Iniciacin de la lactancia materna  Encuentre un lugar cmodo para sentarse o acostarse, con un buen respaldo para el cuello y la  espalda.  Coloque una almohada o una manta enrollada debajo del beb para acomodarlo a la altura de la mama (si est sentada). Las almohadas para Economist se han diseado especialmente a fin de servir de apoyo para los brazos y el beb Kellogg.  Asegrese de que el abdomen del beb est frente al suyo.  Masajee suavemente la mama. Con las yemas de los dedos, masajee la pared del pecho hacia el pezn en un movimiento circular. Esto estimula el flujo de [place]. Es  posible que deba continuar este movimiento mientras amamanta si la leche fluye lentamente.  Sostenga la mama con el pulgar por arriba del pezn y los otros 4 dedos por debajo de la mama. Asegrese de que los dedos se encuentren lejos del pezn y de la boca del beb.  Empuje suavemente los labios del beb con el pezn o con el dedo.  Cuando la boca del beb se abra lo suficiente, acrquelo rpidamente a la mama e introduzca todo el pezn y la zona oscura que lo rodea (areola), tanto como sea posible, dentro de la boca del beb.  Debe haber ms areola visible por arriba del labio superior del beb que por debajo del labio inferior.  La lengua del beb debe estar entre la enca inferior y la Kappa.  Asegrese de que la boca del beb est en la posicin correcta alrededor del pezn (prendida). Los labios del beb deben crear un sello sobre la mama, doblndose hacia afuera (invertidos).  Es comn que el beb succione durante 2 a 3 minutos para que comience el flujo de [place]. Cmo debe prenderse Es muy importante que le ensee al beb cmo prenderse adecuadamente a la mama. Si el beb no se prende adecuadamente, puede causarle dolor en el pezn y reducir la produccin de [place], y hacer que el beb tenga un escaso aumento de [place]. Adems, si el beb no se prende adecuadamente al pezn, puede tragar aire durante la alimentacin. Esto puede causarle molestias al beb. Hacer eructar al beb al Eliezer Lofts de mama puede  ayudarlo a liberar el aire. Sin embargo, ensearle al beb cmo prenderse a la mama adecuadamente es la mejor manera de evitar que se sienta molesto por tragar Administrator, sports se alimenta. Signos de que el beb se ha prendido adecuadamente al pezn:   Hall Busing o succiona de modo silencioso, sin causarle dolor.  Se escucha que traga cada 3 o 4 succiones.   Hay movimientos musculares por arriba y por delante de sus odos al Mining engineer. Signos de que el beb no se ha prendido Product manager al pezn:   Hace ruidos de succin o de chasquido mientras se alimenta.  Dolor en el pezn. Si cree que el beb no se prendi correctamente, deslice el dedo en la comisura de la boca y Micron Technology las encas del beb para interrumpir la succin. Intente comenzar a amamantar nuevamente. Signos de Economist Signos del beb:   Disminucin gradual en el nmero de succiones o cese completo de la succin.  Se duerme.  Relaja el cuerpo.  Retiene una pequea cantidad de [place] en su boca.  Se desprende solo del pecho. Signos que presenta usted:  Las mamas han aumentado la firmeza, el peso y el tamao 1 a 3 horas despus de Economist.  Estn ms blandas inmediatamente despus de amamantar.  Un aumento del volumen de [place], y tambin el cambio de su consistencia y color se producen hacia el quinto da de Therapist, nutritional.  Los pezones no duelen, ni estn agrietados ni sangran. Signos de que su beb recibe la cantidad de leche suficiente  Moja al menos 3 paales en 24 horas. La orina debe ser clara y de color amarillo plido a los [place].  Defeca al menos 3 veces en 24 horas a los 5 das de vida. La materia fecal debe ser blanda y [place].  Defeca al menos 3 veces en 24 horas a los 7 das de vida. La materia fecal debe ser  grumosa y Designer, fashion/clothing.  No registra una prdida de peso mayor del 10% del peso al nacer durante los primeros [place].  Aumenta de peso un  promedio de 4 a 7onzas (120 a 269ml) por semana despus de los [place].  Aumenta de [place], [place], de Los Veteranos I consistente a Proofreader de los 5 das de vida, sin Museum/gallery curator prdida de peso despus de las 2 semanas de vida. Despus de alimentarse, es posible que el beb regurgite una pequea cantidad. Esto es frecuente. FRECUENCIA Y DURACIN DE LA LACTANCIA MATERNA El amamantamiento frecuente la ayudar a producir ms [place] y a Warden/ranger de Social research officer, government en los pezones e hinchazn en las [place]. Alimente al beb cuando muestre signos de hambre o si siente la necesidad de reducir la congestin de las [place]. Esto se denomina "lactancia a demanda". Evite el uso del chupete mientras trabaja para establecer la lactancia (las primeras 4 a 6 semanas despus del nacimiento del beb). Despus de este perodo, podr ofrecerle un chupete. Las investigaciones demostraron que el uso del chupete durante el primer ao de vida del beb disminuye el riesgo de desarrollar el sndrome de muerte sbita del lactante (SMSL). Permita que el nio se alimente en cada mama todo lo que desee. Contine amamantando al beb hasta que haya terminado de alimentarse. Cuando el beb se desprende o se queda dormido mientras se est alimentando de la primera mama, ofrzcale la segunda. Debido a que, con frecuencia, los recin Land O'Lakes las primeras semanas de vida, es posible que deba despertar a su beb para alimentarlo. Los horarios de Writer de un beb a otro. Sin embargo, las siguientes reglas pueden servir como gua para ayudarle a Engineer, materials que el beb se alimenta adecuadamente:  Se puede amamantar a los recin nacidos (bebs de 4 semanas o menos de vida) cada 1 a 3 horas.  No deben transcurrir ms de 3 horas durante el da o 5 horas durante la noche sin que se amamante a los recin nacidos.  Debe amamantar al beb 8 veces como mnimo, en un perodo de 24 horas, hasta que comience a introducir  slidos en su dieta, a los 6 meses de vida aproximadamente. Russell extraccin y Recruitment consultant de la leche materna le permiten asegurarse de que el beb se alimente exclusivamente de [place], aun en momentos en los que no puede amamantar. Esto tiene especial importancia si debe regresar al [place]ne en el perodo en que an est amamantando o si no puede estar presente en los momentos en que el beb debe alimentarse. Su asesor en lactancia puede orientarla sobre cunto tiempo es [place].  El sacaleche es un aparato que le permite extraer leche de la mama a un recipiente estril. Luego, la leche materna extrada puede almacenarse en un refrigerador o freezer. Algunos sacaleches son Theodore Demark, James Ivanoff otros son elctricos. Consulte a su asesor en lactancia qu tipo ser ms conveniente para usted. Los sacaleches se pueden comprar, sin embargo, algunos hospitales y grupos de apoyo a la lactancia materna alquilan Production assistant, radio. Un asesor en lactancia puede ensearle cmo extraer OfficeMax Incorporated, en caso de que prefiera no usar un sacaleche.  CMO CUIDAR LAS MAMAS DURANTE LA LACTANCIA MATERNA Los pezones se secan, agrietan y duelen durante la Therapist, nutritional. Las siguientes recomendaciones pueden ayudarle a Theatre manager las YRC Worldwide y sanas:  Art therapist usar jabn en los pezones.  Use un sostn de soporte. Aunque no  son esenciales, las camisetas sin mangas o los sostenes especiales para Economist estn diseados para acceder fcilmente a las mamas, para Economist sin tener que quitarse todo el sostn o la camiseta. Evite usar sostenes con aro o sostenes muy ajustados.  Seque al aire sus pezones durante 3 a 84minutos despus de amamantar al beb.  Utilice solo apsitos de Chiropodist sostn para Tax adviser las prdidas de [place]. La prdida de un poco de Owens Corning tomas es normal.  Utilice lanolina sobre los pezones  luego de Economist. La lanolina ayuda a mantener la humedad normal de la piel. Si [place] lanolina pura, no tiene que lavarse los pezones antes de Research scientist (life sciences) al beb. La lanolina pura no es txica para el beb. Adems, puede extraer Cisco algunas gotas de [place] materna y Community education officer suavemente esa Franklin Resources, para que la [place] se seque al aire. Durante las primeras semanas despus de dar a luz, algunas mujeres pueden experimentar hinchazn en las mamas (congestin [place]). La congestin puede hacer que sienta las mamas pesadas, calientes y sensibles al tacto. El pico de la congestin ocurre dentro de los 3 a 5 das despus del Luke. Las siguientes recomendaciones pueden ayudarle a Public house manager la congestin:  Vace por completo las mamas al Reynolds American o Printmaker. Puede aplicar calor hmedo en las mamas (en la ducha o con toallas hmedas para manos) antes de Economist o extraer Northeast Utilities. Esto aumenta la circulacin y [place] a que la [place]. Si el beb no vaca por completo las mamas cuando lo amamanta, extraiga la Solen restante despus de que haya finalizado.  Use un sostn ajustado (para amamantar o comn) o camiseta sin mangas durante 1 o 2 das para indicar al cuerpo que disminuya ligeramente la produccin de [place].  Aplique compresas de hielo Erie Insurance Group, a menos que le resulte demasiado incmodo.  Asegrese de que el beb se encuentre en la posicin correcta mientras lo alimenta. Si la congestin persiste luego de 48 horas o despus de seguir estas recomendaciones, comunquese con su mdico o un Lobbyist. RECOMENDACIONES GENERALES PARA EL CUIDADO DE LA SALUD DURANTE LA LACTANCIA MATERNA  Consuma alimentos saludables. Alterne comidas y colaciones, comiendo 3 de cada Glass blower/designer. Dado que lo que come Solectron Corporation, es posible que algunas comidas hagan que su beb se vuelva ms irritable de lo habitual. Evite comer este tipo de alimentos, si percibe que afectan de  manera negativa al beb.  Beba leche, jugos de fruta y agua para Engineer, water su sed (aproximadamente [place]).  Descanse con frecuencia, reljese y tome sus vitaminas prenatales para evitar la fatiga, el estrs y la anemia.  Contine con los autocontroles de la mama.  Evite masticar y fumar tabaco.  Evite el consumo de alcohol y drogas. Algunos medicamentos, que pueden ser perjudiciales para el beb, pueden pasar a travs de la SLM Corporation. Es importante que consulte a su mdico antes de Medical sales representative, incluidos todos los medicamentos recetados y de [place], as como los suplementos vitamnicos y herbales. Puede quedar embarazada durante la lactancia. Si desea controlar la natalidad, consulte a su mdico cules son las opciones ms seguras para el beb. SOLICITE ATENCIN MDICA SI:   Usted siente que quiere dejar de Economist o se siente frustrada con la lactancia.  Siente dolor en las mamas o en los pezones.  Sus pezones estn agrietados o Control and instrumentation engineer.  Sus pechos estn irritados, sensibles o calientes.  Tiene un rea hinchada en cualquiera de las mamas.  Siente escalofros o fiebre.  Tiene nuseas o vmitos.  Presenta una secrecin de otro lquido distinto de la leche materna de los pezones.  Sus mamas no se llenan antes de [employer] al beb para el 5. da despus del parto.  Se siente triste y deprimida.  El beb est demasiado somnoliento como para comer bien.  El beb tiene problemas para dormir.  Moja menos de 3 paales en 24 horas.  Defeca menos de 3 veces en 24 horas.  La piel del beb o la parte blanca de sus ojos est amarilla.  El beb no ha aumentado de [place] a los [place]. SOLICITE ATENCIN MDICA DE INMEDIATO SI:   El beb est muy cansado (aletargado) y no se despierta para comer.  Le sube la fiebre sin causa. Document Released: 09/03/2005 Document Revised: 12/29/2012 [facility] Patient Information 2014 [place],  [place].

## 2014-03-02 NOTE — Addendum Note (Signed)
Addended by: Novella Olive on: 03/02/2014 09:11 AM   Modules accepted: Orders

## 2014-03-02 NOTE — Progress Notes (Signed)
Initial visit today. New OB labs to be drawn. Early 1hr gtt as well.  Reports had pap at [facility] in March and that the pap was normal.

## 2014-03-03 LAB — OBSTETRIC PANEL
ANTIBODY SCREEN: NEGATIVE
BASOS PCT: 0 % (ref 0–1)
Basophils Absolute: 0 10*3/uL (ref 0.0–0.1)
Eosinophils Absolute: 0.1 10*3/uL (ref 0.0–0.7)
Eosinophils Relative: 1 % (ref 0–5)
HCT: 34.9 % — ABNORMAL LOW (ref 36.0–46.0)
HEMOGLOBIN: 11.9 g/dL — AB (ref 12.0–15.0)
HEP B S AG: NEGATIVE
LYMPHS PCT: 29 % (ref 12–46)
Lymphs Abs: 2.6 10*3/uL (ref 0.7–4.0)
MCH: 30.5 pg (ref 26.0–34.0)
MCHC: 34.1 g/dL (ref 30.0–36.0)
MCV: 89.5 fL (ref 78.0–100.0)
MONO ABS: 0.4 10*3/uL (ref 0.1–1.0)
MONOS PCT: 4 % (ref 3–12)
NEUTROS ABS: 5.8 10*3/uL (ref 1.7–7.7)
NEUTROS PCT: 66 % (ref 43–77)
Platelets: 298 10*3/uL (ref 150–400)
RBC: 3.9 MIL/uL (ref 3.87–5.11)
RDW: 13.1 % (ref 11.5–15.5)
RH TYPE: POSITIVE
RUBELLA: 17.4 {index} — AB (ref <0.90)
WBC: 8.8 10*3/uL (ref 4.0–10.5)

## 2014-03-03 LAB — HIV ANTIBODY (ROUTINE TESTING W REFLEX): HIV 1&2 Ab, 4th Generation: NONREACTIVE

## 2014-03-03 LAB — GLUCOSE TOLERANCE, 1 HOUR (50G) W/O FASTING: Glucose, 1 Hour GTT: 123 mg/dL (ref 70–140)

## 2014-03-04 ENCOUNTER — Encounter: Payer: Self-pay | Admitting: Obstetrics and Gynecology

## 2014-03-04 LAB — HEMOGLOBINOPATHY EVALUATION
HEMOGLOBIN OTHER: 0 %
HGB A2 QUANT: 2.6 % (ref 2.2–3.2)
HGB A: 97.4 % (ref 96.8–97.8)
HGB F QUANT: 0 % (ref 0.0–2.0)
HGB S QUANTITAION: 0 %

## 2014-03-04 LAB — PRESCRIPTION MONITORING PROFILE (19 PANEL)
AMPHETAMINE/METH: NEGATIVE ng/mL
BUPRENORPHINE, URINE: NEGATIVE ng/mL
Barbiturate Screen, Urine: NEGATIVE ng/mL
Benzodiazepine Screen, Urine: NEGATIVE ng/mL
CANNABINOID SCRN UR: NEGATIVE ng/mL
CARISOPRODOL, URINE: NEGATIVE ng/mL
COCAINE METABOLITES: NEGATIVE ng/mL
Creatinine, Urine: 440.65 mg/dL (ref >20.0)
FENTANYL URINE: NEGATIVE ng/mL
MDMA URINE: NEGATIVE ng/mL
METHADONE SCREEN, URINE: NEGATIVE ng/mL
METHAQUALONE SCREEN (URINE): NEGATIVE ng/mL
Meperidine, Ur: NEGATIVE ng/mL
Nitrites, Initial: NEGATIVE ug/mL
Opiate Screen, Urine: NEGATIVE ng/mL
Oxycodone Screen, Ur: NEGATIVE ng/mL
PHENCYCLIDINE, UR: NEGATIVE ng/mL
Propoxyphene: NEGATIVE ng/mL
TAPENTADOLUR: NEGATIVE ng/mL
Tramadol Scrn, Ur: NEGATIVE ng/mL
Zolpidem, Urine: NEGATIVE ng/mL
pH, Initial: 6.5 pH (ref 4.5–8.9)

## 2014-03-05 ENCOUNTER — Other Ambulatory Visit: Payer: Self-pay | Admitting: Obstetrics & Gynecology

## 2014-03-05 DIAGNOSIS — Z3682 Encounter for antenatal screening for nuchal translucency: Secondary | ICD-10-CM

## 2014-03-05 LAB — CULTURE, OB URINE: Colony Count: 30000

## 2014-03-15 ENCOUNTER — Inpatient Hospital Stay (HOSPITAL_COMMUNITY)
Admission: AD | Admit: 2014-03-15 | Discharge: 2014-03-15 | Disposition: A | Discharge status: Home / Self Care | Payer: Self-pay | Source: Ambulatory Visit | Attending: Obstetrics & Gynecology | Admitting: Obstetrics & Gynecology

## 2014-03-15 ENCOUNTER — Encounter (HOSPITAL_COMMUNITY): Payer: Self-pay | Admitting: *Deleted

## 2014-03-15 DIAGNOSIS — R112 Nausea with vomiting, unspecified: Secondary | ICD-10-CM | POA: Insufficient documentation

## 2014-03-15 DIAGNOSIS — O34219 Maternal care for unspecified type scar from previous cesarean delivery: Secondary | ICD-10-CM

## 2014-03-15 DIAGNOSIS — R1031 Right lower quadrant pain: Secondary | ICD-10-CM | POA: Insufficient documentation

## 2014-03-15 LAB — CBC WITH DIFFERENTIAL/PLATELET
BASOS ABS: 0 10*3/uL (ref 0.0–0.1)
Basophils Relative: 0 % (ref 0–1)
Eosinophils Absolute: 0.1 10*3/uL (ref 0.0–0.7)
Eosinophils Relative: 1 % (ref 0–5)
HCT: 33.7 % — ABNORMAL LOW (ref 36.0–46.0)
Hemoglobin: 11.6 g/dL — ABNORMAL LOW (ref 12.0–15.0)
LYMPHS ABS: 2.4 10*3/uL (ref 0.7–4.0)
Lymphocytes Relative: 27 % (ref 12–46)
MCH: 31.4 pg (ref 26.0–34.0)
MCHC: 34.4 g/dL (ref 30.0–36.0)
MCV: 91.3 fL (ref 78.0–100.0)
MONO ABS: 0.5 10*3/uL (ref 0.1–1.0)
MONOS PCT: 5 % (ref 3–12)
Neutro Abs: 6 10*3/uL (ref 1.7–7.7)
Neutrophils Relative %: 67 % (ref 43–77)
PLATELETS: 208 10*3/uL (ref 150–400)
RBC: 3.69 MIL/uL — ABNORMAL LOW (ref 3.87–5.11)
RDW: 12.2 % (ref 11.5–15.5)
WBC: 9 10*3/uL (ref 4.0–10.5)

## 2014-03-15 LAB — URINALYSIS, ROUTINE W REFLEX MICROSCOPIC
Bilirubin Urine: NEGATIVE
Glucose, UA: NEGATIVE mg/dL
HGB URINE DIPSTICK: NEGATIVE
Ketones, ur: NEGATIVE mg/dL
Leukocytes, UA: NEGATIVE
Nitrite: NEGATIVE
PROTEIN: NEGATIVE mg/dL
Specific Gravity, Urine: 1.025 (ref 1.005–1.030)
Urobilinogen, UA: 0.2 mg/dL (ref 0.0–1.0)
pH: 6 (ref 5.0–8.0)

## 2014-03-15 LAB — COMPREHENSIVE METABOLIC PANEL
ALBUMIN: 3.3 g/dL — AB (ref 3.5–5.2)
ALT: 17 U/L (ref 0–35)
AST: 20 U/L (ref 0–37)
Alkaline Phosphatase: 72 U/L (ref 39–117)
BUN: 9 mg/dL (ref 6–23)
CO2: 21 mEq/L (ref 19–32)
CREATININE: 0.65 mg/dL (ref 0.50–1.10)
Calcium: 9.5 mg/dL (ref 8.4–10.5)
Chloride: 100 mEq/L (ref 96–112)
GFR calc Af Amer: >90 mL/min (ref >90)
Glucose, Bld: 119 mg/dL — ABNORMAL HIGH (ref 70–99)
Potassium: 3.7 mEq/L (ref 3.7–5.3)
Sodium: 135 mEq/L — ABNORMAL LOW (ref 137–147)
Total Bilirubin: <0.2 mg/dL — ABNORMAL LOW (ref 0.3–1.2)
Total Protein: 7.1 g/dL (ref 6.0–8.3)

## 2014-03-15 LAB — WET PREP, GENITAL
CLUE CELLS WET PREP: NONE SEEN
TRICH WET PREP: NONE SEEN
Yeast Wet Prep HPF POC: NONE SEEN

## 2014-03-15 MED ORDER — TRAMADOL HCL 50 MG PO TABS
50.0000 mg | ORAL_TABLET | Freq: Once | ORAL | Status: AC
  Administered 2014-03-15: 50 mg via ORAL
  Filled 2014-03-15: qty 1

## 2014-03-15 MED ORDER — TRAMADOL-ACETAMINOPHEN 37.5-325 MG PO TABS: 1.0000 | ORAL_TABLET | Freq: Four times a day (QID) | ORAL | Status: DC | PRN

## 2014-03-15 MED ORDER — TRAMADOL-ACETAMINOPHEN 37.5-325 MG PO TABS: 1.0000 | ORAL_TABLET | Freq: Once | ORAL | Status: DC

## 2014-03-15 NOTE — Discharge Instructions (Signed)
Apendicitis (Appendicitis) Se llama apendicitis a la inflamacin del apndice. La inflamacin puede causar la ruptura (perforacin) y la acumulacin de pus. (absceso).  CAUSAS No siempre hay una causa evidente de apendicitis. A veces se produce por una obstruccin en el apndice. Las causas de la obstruccin pueden ser:  Elliot Gault pequea y dura de materia fecal del tamao de una arveja (fecalito).  Ganglios linfticos agrandados en el apndice. SINTOMAS  Dolor alrededor del ombligo que se irradia hacia la zona derecha del abdomen. El dolor puede ser cada vez ms intenso y agudo a medida que el tiempo pasa.  Sensibilidad en la zona inferior derecha del abdomen. El dolor empeora si tose o realiza algn movimiento brusco.  Ganas de vomitar (nuseas).  Vmitos.  Prdida del apetito.  Cristy Hilts.  Constipacin.  Diarrea.  Malestar general. DIAGNSTICO  Examen fsico.  Anlisis de sangre.  Anlisis de [place].  Una radiografa o tomografa computada para confirmar el diagnstico. TRATAMIENTO Una vez que el diagnstico de apendicitis se ha realizado, se procede a extirparlo lo antes posible. Este procedimiento se denomina apendicectoma. En una apendicectoma abierta se realiza un corte (incisin) en la zona inferior derecha del abdomen y se extirpa el apndice. En una apendicectoma laparoscpica, se realizan 3 incisiones pequeas. Para extirpar el apndice, se utiliza un instrumento largo y delgado y Carlota Raspberry. La mayora de los pacientes vuelve a su casa en 24 a 48 horas despus de la [place].  En algunas situaciones, el apndice puede perforarse nuevamente y formarse un absceso. El absceso puede tener una "pared" alrededor y observarse en una tomografa computada. En este caso, le colocarn un drenaje en el absceso para que drene el lquido y podr recibir un tratamiento con antibiticos para [employer] grmenes. Estos medicamentos se administran a travs de un tubo en la vena  (IV). Una vez que el absceso se ha eliminado, podr o no ser [employer] apendicectoma. Puede ser necesario que permanezca en el hospital durante ms de 48 horas.  Document Released: 09/03/2005 Document Revised: 12/29/2012 [facility] Patient Information 2015 [place], [place]. This information is not intended to replace advice given to you by your health care provider. Make sure you discuss any questions you have with your health care provider.

## 2014-03-15 NOTE — MAU Provider Note (Signed)
History     CSN: [id number]  Arrival date and time: 03/15/14 1332   First Provider Initiated Contact with Patient 03/15/14 1516      Chief Complaint  Patient presents with  . Abdominal Pain  . Emesis During Pregnancy   Abdominal Pain This is a recurrent problem. The current episode started yesterday. The onset quality is gradual. The problem occurs intermittently. Duration: pt has had this same pain in the past, before becoming pregnant.  It has been more persistant over the past few days. The problem has been unchanged. The pain is located in the RLQ. The pain is mild. The quality of the pain is aching and dull. The abdominal pain does not radiate. Associated symptoms include nausea. Pertinent negatives include no anorexia, constipation, diarrhea, dysuria, fever, flatus or vomiting. The pain is aggravated by movement and palpation. The pain is relieved by recumbency. She has tried nothing for the symptoms.  Declines pain medication at present time.   Past Medical History  Diagnosis Date  . Diverticulitis     Past Surgical History  Procedure Laterality Date  . Cholecystectomy    . Cesarean section      Family History  Problem Relation Age of Onset  . Hyperlipidemia Father   . Diabetes Father     History  Substance Use Topics  . Smoking status: Never Smoker   . Smokeless tobacco: Never Used  . Alcohol Use: No    Allergies: No Known Allergies  No prescriptions prior to admission    Review of Systems  Constitutional: Negative for fever.  HENT: Negative.   Eyes: Negative.   Respiratory: Negative.   Cardiovascular: Negative.   Gastrointestinal: Positive for nausea and abdominal pain. Negative for vomiting, diarrhea, constipation, anorexia and flatus.  Genitourinary: Negative for dysuria.  Musculoskeletal: Negative.   Skin: Negative.   Neurological: Negative.   Psychiatric/Behavioral: Negative.    Physical Exam   Blood pressure 127/60, pulse 81, temperature  98.4 F (36.9 C), temperature source Oral, resp. rate 16, height 5\' 1"  (1.549 m), weight 87 kg (191 lb 12.8 oz), last menstrual period 11/27/2013.  Physical Exam  Constitutional: She is oriented to person, place, and time. She appears well-developed and well-nourished. No distress.  HENT:  Head: Normocephalic.  Cardiovascular: Normal rate and regular rhythm.   Respiratory: Effort normal and breath sounds normal.  GI: Soft. Bowel sounds are normal. She exhibits no distension and no mass. There is tenderness in the right lower quadrant. There is no rebound and no guarding.    Tenderness to moderate palpation right middle quadrant  Genitourinary: Vagina normal. No vaginal discharge found.  Musculoskeletal: Normal range of motion.  Neurological: She is alert and oriented to person, place, and time.  Skin: Skin is warm and dry. She is not diaphoretic.  Psychiatric: She has a normal mood and affect.   Results for orders placed during the hospital encounter of 03/15/14 (from the past 24 hour(s))  URINALYSIS, ROUTINE W REFLEX MICROSCOPIC     Status: Abnormal   Collection Time    03/15/14  1:55 PM      Result Value Ref Range   Color, Urine YELLOW  YELLOW   APPearance HAZY (*) CLEAR   Specific Gravity, Urine 1.025  1.005 - 1.030   pH 6.0  5.0 - 8.0   Glucose, UA NEGATIVE  NEGATIVE mg/dL   Hgb urine dipstick NEGATIVE  NEGATIVE   Bilirubin Urine NEGATIVE  NEGATIVE   Ketones, ur NEGATIVE  NEGATIVE mg/dL  Protein, ur NEGATIVE  NEGATIVE mg/dL   Urobilinogen, UA 0.2  0.0 - 1.0 mg/dL   Nitrite NEGATIVE  NEGATIVE   Leukocytes, UA NEGATIVE  NEGATIVE  CBC WITH DIFFERENTIAL     Status: Abnormal   Collection Time    03/15/14  2:22 PM      Result Value Ref Range   WBC 9.0  4.0 - 10.5 K/uL   RBC 3.69 (*) 3.87 - 5.11 MIL/uL   Hemoglobin 11.6 (*) 12.0 - 15.0 g/dL   HCT 33.7 (*) 36.0 - 46.0 %   MCV 91.3  78.0 - 100.0 fL   MCH 31.4  26.0 - 34.0 pg   MCHC 34.4  30.0 - 36.0 g/dL   RDW 12.2  11.5 -  15.5 %   Platelets 208  150 - 400 K/uL   Neutrophils Relative % 67  43 - 77 %   Lymphocytes Relative 27  12 - 46 %   Monocytes Relative 5  3 - 12 %   Eosinophils Relative 1  0 - 5 %   Basophils Relative 0  0 - 1 %   Neutro Abs 6.0  1.7 - 7.7 K/uL   Lymphs Abs 2.4  0.7 - 4.0 K/uL   Monocytes Absolute 0.5  0.1 - 1.0 K/uL   Eosinophils Absolute 0.1  0.0 - 0.7 K/uL   Basophils Absolute 0.0  0.0 - 0.1 K/uL   Smear Review MORPHOLOGY UNREMARKABLE    COMPREHENSIVE METABOLIC PANEL     Status: Abnormal   Collection Time    03/15/14  2:22 PM      Result Value Ref Range   Sodium 135 (*) 137 - 147 mEq/L   Potassium 3.7  3.7 - 5.3 mEq/L   Chloride 100  96 - 112 mEq/L   CO2 21  19 - 32 mEq/L   Glucose, Bld 119 (*) 70 - 99 mg/dL   BUN 9  6 - 23 mg/dL   Creatinine, Ser 0.65  0.50 - 1.10 mg/dL   Calcium 9.5  8.4 - 10.5 mg/dL   Total Protein 7.1  6.0 - 8.3 g/dL   Albumin 3.3 (*) 3.5 - 5.2 g/dL   AST 20  0 - 37 U/L   ALT 17  0 - 35 U/L   Alkaline Phosphatase 72  39 - 117 U/L   Total Bilirubin <0.2 (*) 0.3 - 1.2 mg/dL   GFR calc non Af Amer >90  >90 mL/min   GFR calc Af Amer >90  >90 mL/min  WET PREP, GENITAL     Status: Abnormal   Collection Time    03/15/14  3:30 PM      Result Value Ref Range   Yeast Wet Prep HPF POC NONE SEEN  NONE SEEN   Trich, Wet Prep NONE SEEN  NONE SEEN   Clue Cells Wet Prep HPF POC NONE SEEN  NONE SEEN   WBC, Wet Prep HPF POC FEW (*) NONE SEEN     MAU Course  Procedures  MDM:  No evidence of acute appendicitis Possibly r/t diverticulitis     Assessment and Plan  Warning s/s of appendicitis given Rx Tramadol prn  CRESENZO-DISHMAN,FRANCES 03/15/2014, 4:59 PM

## 2014-03-15 NOTE — MAU Note (Signed)
Patient states she has had right lower abdominal pain for 2 days. States she has vomiting every morning and nausea all the time. Denies bleeding or discharge.

## 2014-03-16 LAB — GC/CHLAMYDIA PROBE AMP
CT Probe RNA: NEGATIVE
GC Probe RNA: NEGATIVE

## 2014-03-25 ENCOUNTER — Encounter (HOSPITAL_COMMUNITY): Payer: Self-pay

## 2014-03-25 ENCOUNTER — Ambulatory Visit (HOSPITAL_COMMUNITY)
Admission: RE | Admit: 2014-03-25 | Discharge: 2014-03-25 | Disposition: A | Discharge status: Home / Self Care | Payer: Self-pay | Source: Ambulatory Visit | Attending: Obstetrics & Gynecology | Admitting: Obstetrics & Gynecology

## 2014-03-25 ENCOUNTER — Other Ambulatory Visit: Payer: Self-pay

## 2014-03-25 ENCOUNTER — Other Ambulatory Visit (HOSPITAL_COMMUNITY): Payer: Self-pay

## 2014-03-25 DIAGNOSIS — Z3682 Encounter for antenatal screening for nuchal translucency: Secondary | ICD-10-CM

## 2014-03-25 DIAGNOSIS — Z36 Encounter for antenatal screening of mother: Secondary | ICD-10-CM | POA: Insufficient documentation

## 2014-03-30 ENCOUNTER — Ambulatory Visit (INDEPENDENT_AMBULATORY_CARE_PROVIDER_SITE_OTHER): Payer: Self-pay | Admitting: Obstetrics & Gynecology

## 2014-03-30 ENCOUNTER — Encounter: Payer: Self-pay | Admitting: Obstetrics & Gynecology

## 2014-03-30 VITALS — BP 104/65 | HR 83 | Temp 98.1°F | Wt 190.8 lb

## 2014-03-30 DIAGNOSIS — Z3481 Encounter for supervision of other normal pregnancy, first trimester: Secondary | ICD-10-CM

## 2014-03-30 DIAGNOSIS — Z3480 Encounter for supervision of other normal pregnancy, unspecified trimester: Secondary | ICD-10-CM

## 2014-03-30 LAB — POCT URINALYSIS DIP (DEVICE)
Bilirubin Urine: NEGATIVE
Glucose, UA: NEGATIVE mg/dL
Hgb urine dipstick: NEGATIVE
KETONES UR: NEGATIVE mg/dL
Leukocytes, UA: NEGATIVE
Nitrite: NEGATIVE
PH: 6 (ref 5.0–8.0)
PROTEIN: NEGATIVE mg/dL
SPECIFIC GRAVITY, URINE: 1.02 (ref 1.005–1.030)
Urobilinogen, UA: 0.2 mg/dL (ref 0.0–1.0)

## 2014-03-30 NOTE — Progress Notes (Signed)
Reports occasional mild cramping.

## 2014-03-30 NOTE — Patient Instructions (Signed)
Pruebas genticas. (Genetic Testing) Esta prueba se realiza para encontrar muchos tipos diferentes de enfermedades con gentica anormal. La prueba puede ser muy costosa y generalmente tienen cobertura del seguro. Su mdico le har saber por qu las pruebas de que se est haciendo y Librarian, academic de las pruebas en su caso. Puede hablar con su mdico si debe o no proceder con la prueba. Esta prueba va a buscar los genes del cncer de mama que se pueden identificar en una persona que pueda tener una mayor probabilidad de cncer de seno. Tambin es bueno para las pruebas de cncer de colon gentica, pruebas de enfermedades cardiovasculares, pruebas de la enfermedad de [place] - Isabell Jarvis ( desarrollo de retraso mental en los primeros meses de vida ), las pruebas genticas fibrosis [place] . PREPARACIN PARA EL ESTUDIO No hay una preparacin para esta prueba. HALLAZGOS NORMALES: No hay mutuacin gentica  Los rangos para los resultados normales pueden variar entre diferentes laboratorios y hospitales. Consulte siempre con su mdico despus de Psychologist, counselling estudio para Armed forces logistics/support/administrative officer significado de los [place] y si los valores se consideran "dentro de los lmites normales". SIGNIFICADO DEL ESTUDIO El mdico leer los resultados y Teacher, English as a foreign language con usted la importancia y el significado de los [place], as como las opciones de tratamiento y la necesidad de pruebas adicionales, si fuera necesario. OBTENCIN DE LOS RESULTADOS DE LAS PRUEBAS Es su responsabilidad retirar el resultado del [place]. Consulte en el laboratorio cuando y cmo podr [employer]. Document Released: 06/24/2013 [facility] Patient Information 2015 [place]. This information is not intended to replace advice given to you by your health care provider. Make sure you discuss any questions you have with your health care provider.

## 2014-03-30 NOTE — Progress Notes (Signed)
Pt c/o mild cramping.  She is worried about her lack of weight gain.  needs anatomy scan ~18 weeks- ordered. Genetic testing next visit

## 2014-04-14 ENCOUNTER — Encounter (HOSPITAL_COMMUNITY): Payer: Self-pay | Admitting: Emergency Medicine

## 2014-04-14 ENCOUNTER — Emergency Department (HOSPITAL_COMMUNITY)
Admission: EM | Admit: 2014-04-14 | Discharge: 2014-04-14 | Disposition: A | Discharge status: Home / Self Care | Payer: Self-pay | Attending: Emergency Medicine | Admitting: Emergency Medicine

## 2014-04-14 ENCOUNTER — Emergency Department (HOSPITAL_COMMUNITY): Payer: Self-pay

## 2014-04-14 DIAGNOSIS — R0789 Other chest pain: Secondary | ICD-10-CM

## 2014-04-14 DIAGNOSIS — Z8719 Personal history of other diseases of the digestive system: Secondary | ICD-10-CM | POA: Insufficient documentation

## 2014-04-14 DIAGNOSIS — R071 Chest pain on breathing: Secondary | ICD-10-CM | POA: Insufficient documentation

## 2014-04-14 DIAGNOSIS — R079 Chest pain, unspecified: Secondary | ICD-10-CM | POA: Insufficient documentation

## 2014-04-14 DIAGNOSIS — R109 Unspecified abdominal pain: Secondary | ICD-10-CM | POA: Insufficient documentation

## 2014-04-14 DIAGNOSIS — R1030 Lower abdominal pain, unspecified: Secondary | ICD-10-CM

## 2014-04-14 DIAGNOSIS — Z9089 Acquired absence of other organs: Secondary | ICD-10-CM | POA: Insufficient documentation

## 2014-04-14 LAB — CBC WITH DIFFERENTIAL/PLATELET
BASOS PCT: 0 % (ref 0–1)
Basophils Absolute: 0 10*3/uL (ref 0.0–0.1)
EOS ABS: 0.1 10*3/uL (ref 0.0–0.7)
EOS PCT: 1 % (ref 0–5)
HEMATOCRIT: 33 % — AB (ref 36.0–46.0)
HEMOGLOBIN: 11.2 g/dL — AB (ref 12.0–15.0)
LYMPHS ABS: 2.7 10*3/uL (ref 0.7–4.0)
Lymphocytes Relative: 30 % (ref 12–46)
MCH: 30.7 pg (ref 26.0–34.0)
MCHC: 33.9 g/dL (ref 30.0–36.0)
MCV: 90.4 fL (ref 78.0–100.0)
MONO ABS: 0.6 10*3/uL (ref 0.1–1.0)
MONOS PCT: 6 % (ref 3–12)
Neutro Abs: 5.8 10*3/uL (ref 1.7–7.7)
Neutrophils Relative %: 63 % (ref 43–77)
Platelets: 236 10*3/uL (ref 150–400)
RBC: 3.65 MIL/uL — AB (ref 3.87–5.11)
RDW: 12.3 % (ref 11.5–15.5)
WBC: 9.2 10*3/uL (ref 4.0–10.5)

## 2014-04-14 LAB — URINALYSIS, ROUTINE W REFLEX MICROSCOPIC
Bilirubin Urine: NEGATIVE
Glucose, UA: NEGATIVE mg/dL
Hgb urine dipstick: NEGATIVE
Ketones, ur: 40 mg/dL — AB
Leukocytes, UA: NEGATIVE
Nitrite: NEGATIVE
PROTEIN: NEGATIVE mg/dL
Specific Gravity, Urine: 1.019 (ref 1.005–1.030)
UROBILINOGEN UA: 0.2 mg/dL (ref 0.0–1.0)
pH: 6.5 (ref 5.0–8.0)

## 2014-04-14 LAB — COMPREHENSIVE METABOLIC PANEL
ALT: 15 U/L (ref 0–35)
ANION GAP: 14 (ref 5–15)
AST: 17 U/L (ref 0–37)
Albumin: 3.2 g/dL — ABNORMAL LOW (ref 3.5–5.2)
Alkaline Phosphatase: 89 U/L (ref 39–117)
BUN: 8 mg/dL (ref 6–23)
CO2: 21 mEq/L (ref 19–32)
CREATININE: 0.57 mg/dL (ref 0.50–1.10)
Calcium: 9.4 mg/dL (ref 8.4–10.5)
Chloride: 101 mEq/L (ref 96–112)
GFR calc Af Amer: >90 mL/min (ref >90)
GFR calc non Af Amer: >90 mL/min (ref >90)
GLUCOSE: 96 mg/dL (ref 70–99)
POTASSIUM: 3.5 meq/L — AB (ref 3.7–5.3)
Sodium: 136 mEq/L — ABNORMAL LOW (ref 137–147)
TOTAL PROTEIN: 7 g/dL (ref 6.0–8.3)
Total Bilirubin: <0.2 mg/dL — ABNORMAL LOW (ref 0.3–1.2)

## 2014-04-14 LAB — I-STAT TROPONIN, ED
Troponin i, poc: 0 ng/mL (ref 0.00–0.08)
Troponin i, poc: 0 ng/mL (ref 0.00–0.08)

## 2014-04-14 LAB — D-DIMER, QUANTITATIVE (NOT AT ARMC): D DIMER QUANT: 0.63 ug{FEU}/mL — AB (ref 0.00–0.48)

## 2014-04-14 LAB — LIPASE, BLOOD: LIPASE: 24 U/L (ref 11–59)

## 2014-04-14 NOTE — ED Notes (Signed)
Patient transported to X-ray 

## 2014-04-14 NOTE — ED Provider Notes (Signed)
CSN: [id number]     Arrival date & time 04/14/14  0706 History   First MD Initiated Contact with Patient 04/14/14 [phone]     Chief Complaint  Patient presents with  . Chest Pain     (Consider location/radiation/quality/duration/timing/severity/associated sxs/prior Treatment) HPI 28 year old female approximately 66 weeks estimated gestational age with a documented intrauterine pregnancy presents with 2 hours of a sharp stabbing pleuritic positional anterior chest pain without radiation or associated symptoms such as fever cough shortness of breath and also has 2 days of intermittent suprapubic and mild crampy lower abdominal pain lasting a few minutes at a time no dysuria no vaginal bleeding no vaginal discharge no nausea no vomiting no diarrhea. Her abdominal pain is very mild in her chest pain is moderate. Her abdominal pain has been intermittent since yesterday lasting only a few minutes at a time. There is no treatment prior to arrival. She has no history of blood clots previously. PERC negative. She has no exertional chest pain. Past Medical History  Diagnosis Date  . Diverticulitis    Past Surgical History  Procedure Laterality Date  . Cholecystectomy    . Cesarean section     Family History  Problem Relation Age of Onset  . Hyperlipidemia Father   . Diabetes Father    History  Substance Use Topics  . Smoking status: Never Smoker   . Smokeless tobacco: Never Used  . Alcohol Use: No   OB History   Grav Para Term Preterm Abortions TAB SAB Ect Mult Living   5 2 1 1 2  2   2      Review of Systems 10 Systems reviewed and are negative for acute change except as noted in the HPI.   Allergies  Review of patient's allergies indicates no known allergies.  Home Medications   Prior to Admission medications   Medication Sig Start Date End Date Taking? Authorizing Provider  acetaminophen (TYLENOL) 325 MG tablet Take 325 mg by mouth every 6 (six) hours as needed for moderate pain.     Yes Historical Provider, MD   BP 95/50  Pulse 78  Temp(Src) 98.3 F (36.8 C) (Oral)  Resp 16  SpO2 96%  LMP 11/27/2013 Physical Exam  Nursing note and vitals reviewed. Constitutional:  Awake, alert, nontoxic appearance.  HENT:  Head: Atraumatic.  Eyes: Right eye exhibits no discharge. Left eye exhibits no discharge.  Neck: Neck supple.  Cardiovascular: Normal rate and regular rhythm.   No murmur heard. Pulmonary/Chest: Effort normal and breath sounds normal. No respiratory distress. She has no wheezes. She has no rales. She exhibits tenderness.  Reproducible anterior chest wall tenderness without rash  Abdominal: Soft. Bowel sounds are normal. She exhibits no distension and no mass. There is tenderness. There is no rebound and no guarding.  Minimal suprapubic tenderness only; the rest of the abdomen is nontender  Musculoskeletal: She exhibits no tenderness.  Baseline ROM, no obvious new focal weakness.  Neurological: She is alert.  Mental status and motor strength appears baseline for patient and situation.  Skin: No rash noted.  Psychiatric: She has a normal mood and affect.    ED Course  Procedures (including critical care time) Chaperone present for bimanual examination no tenderness and no bleeding or discharge noted on examination glove; unable to palpate cervix. 91 PERC negative with pregnancy-adjusted negative D-dimer so doubt PE. Positive fetal heart motion and fetal movement on POCUS. Patient informed of clinical course, understand medical decision-making process, and agree with plan. Labs  Review Labs Reviewed  CBC WITH DIFFERENTIAL - Abnormal; Notable for the following:    RBC 3.65 (*)    Hemoglobin 11.2 (*)    HCT 33.0 (*)    All other components within normal limits  COMPREHENSIVE METABOLIC PANEL - Abnormal; Notable for the following:    Sodium 136 (*)    Potassium 3.5 (*)    Albumin 3.2 (*)    Total Bilirubin <0.2 (*)    All other components within  normal limits  URINALYSIS, ROUTINE W REFLEX MICROSCOPIC - Abnormal; Notable for the following:    APPearance HAZY (*)    Ketones, ur 40 (*)    All other components within normal limits  D-DIMER, QUANTITATIVE - Abnormal; Notable for the following:    D-Dimer, Quant 0.63 (*)    All other components within normal limits  LIPASE, BLOOD  I-STAT TROPOININ, ED  Randolm Idol, ED    Imaging Review Dg Chest 2 View  04/14/2014   CLINICAL DATA:  Pregnant.  Chest pain of 1 day duration.  EXAM: CHEST  2 VIEW  COMPARISON:  01/08/2014  FINDINGS: Abdominal shielding was performed. Heart size is normal. Mediastinal shadows are normal. The lungs are clear. No effusions. No bony abnormalities.  IMPRESSION: Normal chest   Electronically Signed   By: Nelson Chimes M.D.   On: 04/14/2014 07:56     EKG Interpretation   Date/Time:  Wednesday April 14 2014 07:28:15 EDT Ventricular Rate:  88 PR Interval:  145 QRS Duration: 96 QT Interval:  358 QTC Calculation: 433 R Axis:   45 Text Interpretation:  Sinus rhythm Low voltage, precordial leads No  significant change since last tracing Confirmed by [facility]  MD, Jenny Reichmann  [phone]) on 04/14/2014 7:35:34 AM      MDM   Final diagnoses:  Chest wall pain  Lower abdominal pain    I doubt any other EMC precluding discharge at this time including, but not necessarily limited to the following:AMI, PE, SBI.    Babette Relic, MD 04/14/14 2225

## 2014-04-14 NOTE — Discharge Instructions (Signed)
You have been diagnosed by your caregiver as having chest wall pain. SEEK IMMEDIATE MEDICAL ATTENTION IF: You develop a fever.  Your chest pains become severe or intolerable.  You develop new, unexplained symptoms (problems).  You develop shortness of breath, nausea, vomiting, sweating or feel light headed.  You develop a new cough or you cough up blood.  Abdominal (belly) pain can be caused by many things. Your caregiver performed an examination and possibly ordered blood/urine tests and imaging (CT scan, x-rays, ultrasound). Many cases can be observed and treated at home after initial evaluation in the emergency department. Even though you are being discharged home, abdominal pain can be unpredictable. Therefore, you need a repeated exam if your pain does not resolve, returns, or worsens. Most patients with abdominal pain don't have to be admitted to the hospital or have surgery, but serious problems like appendicitis and gallbladder attacks can start out as nonspecific pain. Many abdominal conditions cannot be diagnosed in one visit, so follow-up evaluations are very important. SEEK IMMEDIATE MEDICAL ATTENTION IF: The pain does not go away or becomes severe.  A temperature above 101 develops.  Repeated vomiting occurs (multiple episodes).  The pain becomes localized to portions of the abdomen. The right side could possibly be appendicitis. In an adult, the left lower portion of the abdomen could be colitis or diverticulitis.  Blood is being passed in stools or vomit (bright red or black tarry stools).  Return also if you develop chest pain, difficulty breathing, dizziness or fainting, or become confused, poorly responsive, or inconsolable (young children). Dolor abdominal en el embarazo (Abdominal Pain During Pregnancy) El dolor abdominal es frecuente durante el embarazo. Generalmente no causa ningn dao. El dolor abdominal puede tener numerosas causas. Algunas causas son ms graves que otras.  Ciertas causas de dolor abdominal durante el embarazo se diagnostican fcilmente. A veces, se tarda un tiempo para llegar al diagnstico. Otras veces la causa no se conoce. El dolor abdominal puede estar relacionado con [place] alteracin del [place], o puede deberse a una causa totalmente diferente. Por este motivo, siempre consulte a su mdico cuando sienta molestias abdominales. INSTRUCCIONES PARA EL CUIDADO EN EL HOGAR  Est atenta al dolor para ver si hay cambios. Las siguientes indicaciones ayudarn a Writer Ryder System pueda sentir:  No Consulting civil engineer sexuales y no coloque nada dentro de la vagina hasta que los sntomas hayan desaparecido completamente.  Descanse todo lo que pueda [employer] Life Insurance dolor se le haya calmado.  Si siente nuseas, beba lquidos claros. Evite los alimentos slidos mientras sienta malestar o tenga nuseas.  Tome slo medicamentos de venta libre o recetados, segn las indicaciones del mdico.  Cumpla con todas las visitas de control, segn le indique su mdico. SOLICITE ATENCIN MDICA DE INMEDIATO SI:  Tiene un sangrado, prdida de lquidos o elimina tejidos por la vagina.  El dolor o los clicos [place].  Tiene vmitos persistentes.  Comienza a Education officer, environmental al orinar u [employer].  Tiene fiebre.  Nota que los movimientos del beb disminuyen.  Siente intensa debilidad o se marea.  Tiene dificultad para respirar con o sin dolor abdominal.  Siente un dolor de cabeza intenso junto al dolor abdominal.  Wilma Flavin secrecin vaginal anormal con dolor abdominal.  Tiene diarrea persistente.  El dolor abdominal sigue o empeora an despus de Magazine features editor. ASEGRESE DE QUE:   Comprende estas instrucciones.  Controlar su afeccin.  Recibir ayuda de inmediato si no mejora o si empeora. Document Released:  09/03/2005 Document Revised: 06/24/2013 ExitCare Patient Information 2015 [place], [place]. This information is not intended to  replace advice given to you by your health care provider. Make sure you discuss any questions you have with your health care provider.  Dolor abdominal en las mujeres (Abdominal Pain, Women) El dolor abdominal (en el estmago, la pelvis o el vientre) puede tener muchas causas. Es importante que le informe a su mdico:  La ubicacin del Social research officer, government.  Viene y se va, o persiste todo el tiempo?  Hay situaciones que English as a second language teacher (comer ciertos alimentos, la actividad fsica)?  Tiene otros sntomas asociados al dolor (fiebre, nuseas, vmitos, diarrea)? Todo es de gran ayuda cuando se trata de hallar la causa del dolor. CAUSAS  Estmago: Infecciones por virus o bacterias, o lcera.  Intestino: Apendicitis (apndice inflamado), ileitis regional (enfermedad de Crohn), colitis ulcerosa (colon inflamado), sndrome del colon irritable, diverticulitis (inflamacin de los divertculos del colon) o cncer de estmago oo intestino.  Enfermedades de la vescula biliar o clculos.  Enfermedades renales, clculos o infecciones en el rin.  Infeccin o cncer del pncreas.  Fibromialgia (trastorno doloroso)  Enfermedades de los rganos femeninos:  Uterus: tero: fibroma (tumor no canceroso) o infeccin  Trompas de [place]: infeccin o embarazo ectpico  En los ovarios, quistes o tumores.  Adherencias plvicas (tejido cicatrizal).  Endometriosis (el tejido que cubre el tero se desarrolla en la pelvis y los rganos plvicos).  Sndrome de Occupational psychologist (los rganos femeninos se llenan de sangre antes del periodo menstrual(  Dolor durante el periodo menstrual.  Dolor durante la ovulacin (al producir vulos).  Dolor al usar el DIU (dispositivo intrauterino para el control de la natalidad)  Programmer, systems los rganos femeninos.  Dolor funcional (no est originado en una enfermedad, puede mejorar sin tratamiento).  Dolor de origen psicolgico  Depresin. DIAGNSTICO Su mdico  decidir la gravedad del dolor a travs del examen fsico  Anlisis de sangre  Radiografas  Ecografas  TC (tomografa computada, tipo especial de radiografas).  IMR (resonancia magntica)  Cultivos, en el caso una infeccin  Colon por enema de bario (se inserta una sustancia de contraste en el intestino grueso para mejorar la observacin con rayos X.)  Colonoscopa (observacin del intestino con un tubo luminoso).  Laparoscopa (examen del interior del abdomen con un tubo que tiene [employer]).  Ciruga exploratoria abdominal mayor (se observa el abdomen realizando una gran incisin). TRATAMIENTO El tratamiento depender de la causa del problema.   Muchos de estos casos pueden controlarse y tratarse en casa.  Medicamentos de venta libre indicados por el mdico.  Medicamentos con receta.  Antibiticos, en caso de infeccin  Pldoras anticonceptivas, en el caso de perodos dolorosos o dolor al ovular.  Tratamiento hormonal, para la endometriosis  Inyecciones para bloqueo nervioso selectivo.  Fisioterapia.  Antidepresivos.  Consejos por parte de un psclogo o psiquiatra.  Ciruga mayor o menor. INSTRUCCIONES PARA EL CUIDADO DOMICILIARIO  No tome ni administre laxantes a menos que se lo haya indicado su mdico.  Tome analgsicos de venta libre slo si se lo ha indicado el profesional que lo asiste. No tome aspirina, ya que puede causar [employer] o hemorragias.  Consuma una dieta lquida (caldo o agua) segn lo indicado por el mdico. Progrese lentamente a una dieta blanda, segn la tolerancia, si el dolor se relaciona con el estmago o el intestino.  Tenga un termmetro y tmese la temperatura varias veces al da.  Haga reposo en la cama  y [place], si esto Ship broker.  Evite las relaciones sexuales, Higher education careers adviser.  Evite las situaciones estresantes.  Cumpla con las visitas y los anlisis de control, segn las indicaciones de su  mdico.  Si el dolor no se [place] con los medicamentos o la [place], [place] tratar con:  Acupuntura.  Ejercicios de relajacin (yoga, meditacin).  Terapia grupal.  Psicoterapia. SOLICITE ATENCIN MDICA SI:  Nota que ciertos Writer de [place].  El tratamiento indicado para Lexicographer no Engineer, civil (consulting).  Necesita analgsicos ms fuertes.  Quiere que le retiren el DIU.  Si se siente confundido o desfalleciente.  Presenta nuseas o vmitos.  Aparece una erupcin cutnea.  Sufre efectos adversos o una reaccin alrgica debido a los medicamentos que toma. SOLICITE ATENCIN MDICA DE INMEDIATO SI:  El dolor persiste o se agrava.  Tiene fiebre.  Siente el dolor slo en algunos sectores del abdomen. Si se localiza en la zona derecha, posiblemente podra tratarse de apendicitis. En un adulto, si se localiza en la regin inferior izquierda del abdomen, podra tratarse de colitis o diverticulitis.  Hay sangre en las heces (deposiciones de color rojo brillante o negro alquitranado), con o sin vmitos.  Usted presenta sangre en la orina.  Siente escalofros con o sin fiebre.  Se desmaya. ASEGRESE QUE:   Comprende estas instrucciones.  Controlar su enfermedad.  Solicitar ayuda de inmediato si no mejora o si empeora. Document Released: 12/20/2008 Document Revised: 11/26/2011 [facility] Patient Information 2015 [place]. This information is not intended to replace advice given to you by your health care provider. Make sure you discuss any questions you have with your health care provider.

## 2014-04-14 NOTE — ED Notes (Signed)
Pt c/o chest pain since this morning when she woke up.  States it is centrally located and is also having low abd pain.  States that the abd pain started yesterday.  Describes chest pain as constant and abd pain as off and on.  Denies NVD.  Denies vaginal discharge.  Denies dysuria.

## 2014-04-25 ENCOUNTER — Inpatient Hospital Stay (HOSPITAL_COMMUNITY)
Admission: AD | Admit: 2014-04-25 | Discharge: 2014-04-25 | Disposition: A | Discharge status: Home / Self Care | Payer: Self-pay | Source: Ambulatory Visit | Attending: Family Medicine | Admitting: Family Medicine

## 2014-04-25 ENCOUNTER — Encounter (HOSPITAL_COMMUNITY): Payer: Self-pay | Admitting: *Deleted

## 2014-04-25 DIAGNOSIS — O9989 Other specified diseases and conditions complicating pregnancy, childbirth and the puerperium: Principal | ICD-10-CM

## 2014-04-25 DIAGNOSIS — R0781 Pleurodynia: Secondary | ICD-10-CM

## 2014-04-25 DIAGNOSIS — R1031 Right lower quadrant pain: Secondary | ICD-10-CM | POA: Insufficient documentation

## 2014-04-25 DIAGNOSIS — N949 Unspecified condition associated with female genital organs and menstrual cycle: Secondary | ICD-10-CM

## 2014-04-25 DIAGNOSIS — O99891 Other specified diseases and conditions complicating pregnancy: Secondary | ICD-10-CM | POA: Insufficient documentation

## 2014-04-25 DIAGNOSIS — R079 Chest pain, unspecified: Secondary | ICD-10-CM | POA: Insufficient documentation

## 2014-04-25 LAB — CBC WITH DIFFERENTIAL/PLATELET
Basophils Absolute: 0 10*3/uL (ref 0.0–0.1)
Basophils Relative: 0 % (ref 0–1)
EOS ABS: 0.1 10*3/uL (ref 0.0–0.7)
EOS PCT: 1 % (ref 0–5)
HCT: 31.3 % — ABNORMAL LOW (ref 36.0–46.0)
Hemoglobin: 10.6 g/dL — ABNORMAL LOW (ref 12.0–15.0)
Lymphocytes Relative: 33 % (ref 12–46)
Lymphs Abs: 3.5 10*3/uL (ref 0.7–4.0)
MCH: 30.9 pg (ref 26.0–34.0)
MCHC: 33.9 g/dL (ref 30.0–36.0)
MCV: 91.3 fL (ref 78.0–100.0)
Monocytes Absolute: 0.5 10*3/uL (ref 0.1–1.0)
Monocytes Relative: 5 % (ref 3–12)
Neutro Abs: 6.3 10*3/uL (ref 1.7–7.7)
Neutrophils Relative %: 61 % (ref 43–77)
PLATELETS: 240 10*3/uL (ref 150–400)
RBC: 3.43 MIL/uL — ABNORMAL LOW (ref 3.87–5.11)
RDW: 12.7 % (ref 11.5–15.5)
WBC: 10.4 10*3/uL (ref 4.0–10.5)

## 2014-04-25 LAB — URINALYSIS, ROUTINE W REFLEX MICROSCOPIC
Bilirubin Urine: NEGATIVE
GLUCOSE, UA: NEGATIVE mg/dL
HGB URINE DIPSTICK: NEGATIVE
Ketones, ur: NEGATIVE mg/dL
Leukocytes, UA: NEGATIVE
Nitrite: NEGATIVE
Protein, ur: NEGATIVE mg/dL
SPECIFIC GRAVITY, URINE: 1.015 (ref 1.005–1.030)
Urobilinogen, UA: 0.2 mg/dL (ref 0.0–1.0)
pH: 7.5 (ref 5.0–8.0)

## 2014-04-25 MED ORDER — GI COCKTAIL ~~LOC~~
30.0000 mL | Freq: Once | ORAL | Status: AC
  Administered 2014-04-25: 30 mL via ORAL
  Filled 2014-04-25: qty 30

## 2014-04-25 NOTE — MAU Provider Note (Signed)
History     CSN: [id number]  Arrival date and time: 04/25/14 0020   First Provider Initiated Contact with Patient 04/25/14 0340      No chief complaint on file.  HPI This is a 28 y.o. female at [redacted]w[redacted]d who presents with two somatic complaints. She is primarily worried about rib pain on the right lower costal area. States this is near where she had pain with her gallbladder (removed), and wonders if it could be that problem again. Denies nausea or vomiting. The other complaint is Right groin/abd area. C/w round ligament pain. Denies contractions or bleeding.  She does have a hx of diverticulitis and C/S.   Has an appt in clinic this Tuesday.   Was seen in ED a week ago for chest pain with full work up that was negative. States this pain is no longer there.   OB History   Grav Para Term Preterm Abortions TAB SAB Ect Mult Living   5 2 1 1 2  2   2       Past Medical History  Diagnosis Date  . Diverticulitis     Past Surgical History  Procedure Laterality Date  . Cholecystectomy    . Cesarean section      Family History  Problem Relation Age of Onset  . Hyperlipidemia Father   . Diabetes Father     History  Substance Use Topics  . Smoking status: Never Smoker   . Smokeless tobacco: Never Used  . Alcohol Use: No    Allergies: No Known Allergies  Prescriptions prior to admission  Medication Sig Dispense Refill  . acetaminophen (TYLENOL) 325 MG tablet Take 325 mg by mouth every 6 (six) hours as needed for moderate pain.         Review of Systems  Constitutional: Negative for fever, chills and malaise/fatigue.  Respiratory: Negative for cough, sputum production, shortness of breath and wheezing.   Cardiovascular:       Chest wall pain, right lower costal margin   Gastrointestinal: Positive for abdominal pain (RLQ). Negative for heartburn, nausea, vomiting, diarrhea and constipation.  Genitourinary: Negative for dysuria.  Neurological: Negative for headaches.    Physical Exam   Blood pressure 109/54, pulse 79, temperature 98.2 F (36.8 C), temperature source Oral, resp. rate 18, height 5' (1.524 m), weight 86.546 kg (190 lb 12.8 oz), last menstrual period 11/27/2013.  Physical Exam  Constitutional: She is oriented to person, place, and time. She appears well-developed and well-nourished. No distress.  HENT:  Head: Normocephalic.  Cardiovascular: Normal rate.   Respiratory: Effort normal. She exhibits tenderness (over lower right rib).  GI: Soft. She exhibits no distension. There is tenderness (slight over Right Round ligament). There is no rebound and no guarding.  Genitourinary: Vagina normal and uterus normal. No vaginal discharge found.  Cervix long and closed   Musculoskeletal: Normal range of motion.  Neurological: She is alert and oriented to person, place, and time.  Skin: Skin is warm and dry.  Psychiatric: She has a normal mood and affect.    MAU Course  Procedures  MDM Results for orders placed during the hospital encounter of 04/25/14 (from the past 24 hour(s))  URINALYSIS, ROUTINE W REFLEX MICROSCOPIC     Status: None   Collection Time    04/25/14 12:38 AM      Result Value Ref Range   Color, Urine YELLOW  YELLOW   APPearance CLEAR  CLEAR   Specific Gravity, Urine 1.015  1.005 - 1.030  pH 7.5  5.0 - 8.0   Glucose, UA NEGATIVE  NEGATIVE mg/dL   Hgb urine dipstick NEGATIVE  NEGATIVE   Bilirubin Urine NEGATIVE  NEGATIVE   Ketones, ur NEGATIVE  NEGATIVE mg/dL   Protein, ur NEGATIVE  NEGATIVE mg/dL   Urobilinogen, UA 0.2  0.0 - 1.0 mg/dL   Nitrite NEGATIVE  NEGATIVE   Leukocytes, UA NEGATIVE  NEGATIVE   CBC    Component Value Date/Time   WBC 10.4 04/25/2014 0425   RBC 3.43* 04/25/2014 0425   HGB 10.6* 04/25/2014 0425   HCT 31.3* 04/25/2014 0425   PLT 240 04/25/2014 0425   MCV 91.3 04/25/2014 0425   MCH 30.9 04/25/2014 0425   MCHC 33.9 04/25/2014 0425   RDW 12.7 04/25/2014 0425   LYMPHSABS 3.5 04/25/2014 0425   MONOABS 0.5  04/25/2014 0425   EOSABS 0.1 04/25/2014 0425   BASOSABS 0.0 04/25/2014 0425      Assessment and Plan  A:  SIUP at [redacted]w[redacted]d       Right costal tenderness, probably due to softening of cartilage      Round ligament pain      No evidence of preterm labor  P:  Discussed issues and comfort measures      May use Tylenol for pain      Reassured there is no evidence of problems with gallbladder      Followup in office this Tuesday  [facility] 04/25/2014, 4:06 AM

## 2014-04-25 NOTE — Progress Notes (Signed)
No chest pain currently.

## 2014-04-25 NOTE — MAU Note (Signed)
Having pain upper right quadrant of stomach. Some pain in mid chest like I can't breathe which has been coming and going for a wk. Pressure and tightening in chest. Occ pain RLQ of abd

## 2014-04-25 NOTE — MAU Provider Note (Signed)
Attestation of Attending Supervision of Advanced Practitioner (PA/CNM/NP): Evaluation and management procedures were performed by the Advanced Practitioner under my supervision and collaboration.  I have reviewed the Advanced Practitioner's note and chart, and I agree with the management and plan.  [name], MD [facility] Attending 04/25/2014 7:14 AM

## 2014-04-25 NOTE — Discharge Instructions (Signed)
Dolor de pecho (no especfico) (Chest Pain (Nonspecific)) Suele ser difcil diagnosticar la causa del dolor de [place]. Siempre hay una posibilidad de que el dolor podra estar relacionado con algo grave, como un ataque al corazn o un cogulo sanguneo en los pulmones. Debe concurrir a las visitas de control con el mdico. CUIDADOS EN EL HOGAR  Si le dieron antibiticos, tmelos como se lo haya indicado el mdico. Finalice el medicamento, aunque comience a Sports administrator.  [address], no haga actividades que provoquen dolor de [place]. Contine con las actividades fsicas como se lo haya indicado el mdico.  No use productos que contengan tabaco, que incluyen cigarrillos, tabaco para Higher education careers adviser y Psychologist, sport and exercise.  Evite el consumo de alcohol.  Tome los medicamentos solamente como se lo haya indicado el mdico.  Siga las sugerencias del mdico en lo que respecta a ms pruebas, si el dolor de pecho no desaparece.  Concurra a todas las visitas que concert con el mdico. SOLICITE AYUDA SI:  El dolor de pecho no desaparece, incluso despus del tratamiento.  Tiene una erupcin cutnea con ampollas en el pecho.  Tiene fiebre. SOLICITE AYUDA DE INMEDIATO SI:   Aumenta el dolor de pecho o el dolor se irradia hacia el brazo, el cuello, la [place], la espalda o el vientre (abdomen).  Le falta el aire.  Tose ms de lo normal o tose con sangre.  Siente un dolor muy intenso en la espalda o el vientre.  Tiene malestar estomacal (nuseas) o vomita.  Se siente muy dbil.  Pierde el conocimiento (se desmaya).  Tiene escalofros. Esto es Engineer, maintenance (IT). No espere a ver que los problemas desaparezcan. Llame a los servicios de emergencia locales (911 en [place]). No conduzca por sus propios medios [employer] hospital. ASEGRESE DE QUE:   Comprende estas instrucciones.  Controlar su afeccin.  Recibir ayuda de inmediato si no mejora o si empeora. Document  Released: 11/30/2008 Document Revised: 09/08/2013 [facility] Patient Information 2015 [place]. This information is not intended to replace advice given to you by your health care provider. Make sure you discuss any questions you have with your health care provider.   Segundo trimestre de Media planner (Second Trimester of Pregnancy) El segundo trimestre va desde la semana13 hasta la 66, desde el cuarto hasta el sexto mes, y suele ser el momento en el que mejor se siente. Su organismo se ha adaptado a Public relations account executive y comienza a Print production planner. En general, las nuseas matutinas han disminuido o han desaparecido completamente, p El segundo trimestre es tambin la poca en la que el feto se desarrolla rpidamente. Hacia el final del sexto mes, el feto mide aproximadamente 9pulgadas (23cm) y pesa alrededor de 1 libras (700g). Es probable que sienta que el beb se Software engineer (da pataditas) entre las 64 y 20semanas del Media planner. CAMBIOS EN EL ORGANISMO Su organismo atraviesa por muchos cambios durante el [place], y estos varan de Ardelia Mems mujer a Theatre manager.   Seguir [employer]. Notar que la parte baja del abdomen sobresale.  Podrn aparecer las primeras Apache Corporation caderas, el abdomen y las [place].  Es posible que tenga dolores de cabeza que pueden aliviarse con los medicamentos que su mdico autorice.  Tal vez tenga necesidad de orinar con ms frecuencia porque el feto est ejerciendo presin Field seismologist.  Debido al Glennis Brink podr sentir Victorio Palm estomacal con frecuencia.  Puede estar estreida, ya que ciertas hormonas enlentecen los movimientos de los msculos que [place]  los desechos a travs de los intestinos.  Pueden aparecer hemorroides o abultarse e hincharse las venas (venas varicosas).  Puede tener dolor de espalda que se debe al Southern Company de peso y a que las hormonas del Scientist, research (life sciences) las articulaciones entre los huesos de la pelvis, y Civil Service fast streamer consecuencia de la  modificacin del peso y los msculos que mantienen el equilibrio.  Las [employer] seguirn creciendo y Teaching laboratory technician.  Las Production manager y estar sensibles al cepillado y al hilo dental.  Pueden aparecer zonas oscuras o manchas (cloasma, mscara del Media planner) en el rostro que probablemente se atenuarn despus del nacimiento del beb.  Es posible que se forme una lnea oscura desde el ombligo hasta la zona del pubis (linea nigra) que probablemente se atenuarn despus del nacimiento del beb.  Tal vez haya cambios en el cabello que pueden incluir su engrosamiento, crecimiento rpido y cambios en la textura. Adems, a algunas mujeres se les cae el cabello durante o despus del embarazo, o tienen el cabello seco o fino. Lo ms probable es que el cabello se le normalice despus del nacimiento del beb. QU DEBE ESPERAR EN LAS CONSULTAS PRENATALES Durante una visita prenatal de rutina:  La pesarn para asegurarse de que usted y el feto estn creciendo normalmente.  Le tomarn la presin arterial.  Le medirn el abdomen para controlar el desarrollo del beb.  Se escucharn los latidos cardacos fetales.  Se evaluarn los resultados de los estudios solicitados en visitas anteriores. El mdico puede preguntarle lo siguiente:  Cmo se siente.  Si siente los movimientos del beb.  Si ha tenido sntomas anormales, como prdida de lquido, Platinum, dolores de cabeza intensos o clicos abdominales.  Si tiene [employer]oco. Otros estudios que podrn realizarse durante el segundo trimestre incluyen lo siguiente:  Anlisis de sangre para detectar:  Concentraciones de hierro bajas (anemia).  Diabetes gestacional (entre la semana 24 y la 24).  Anticuerpos Rh.  Anlisis de orina para detectar infecciones, diabetes o protenas en la orina.  Una ecografa para confirmar que el beb crece y se desarrolla correctamente.  Una amniocentesis para diagnosticar posibles problemas  genticos.  Estudios del feto para descartar espina bfida y sndrome de [place]. INSTRUCCIONES PARA EL CUIDADO EN EL HOGAR   Evite fumar, consumir hierbas, beber alcohol y tomar frmacos que no le hayan recetado. Estas sustancias qumicas afectan la formacin y el desarrollo del beb.  [place] mdico en relacin con el uso de medicamentos. Durante el embarazo, hay medicamentos que son seguros de tomar y otros que no.  Haga actividad fsica solo en la forma indicada por el mdico. Sentir clicos uterinos es un buen signo para Ambulance person actividad fsica.  Contine comiendo alimentos que sanos con regularidad.  Use un sostn que le brinde buen soporte si le Nordstrom.  No se d baos de inmersin en agua caliente, baos turcos ni saunas.  Colquese el cinturn de seguridad cuando conduzca.  No coma carne cruda ni queso sin cocinar; evite el contacto con las bandejas sanitarias de los gatos y la tierra que estos animales usan. Estos elementos contienen grmenes que pueden causar defectos congnitos en el beb.  [place].  Si est estreida, pruebe un laxante suave (si el mdico lo autoriza). Consuma ms alimentos ricos en fibra, como vegetales y frutas frescos y Psychologist, prison and probation services. Beba gran cantidad de lquido para mantener la orina de tono claro o color amarillo plido.  Dese baos de asiento  con agua tibia para Best boy o las molestias causadas por las hemorroides. Use una crema para las hemorroides si el mdico la autoriza.  Si tiene venas varicosas, use medias de descanso. Eleve los pies durante 70minutos, 3 o 4veces por da. Limite la cantidad de sal en su dieta.  No levante objetos pesados, use zapatos de tacones bajos y mantenga una buena postura.  Descanse con las piernas elevadas si tiene calambres o dolor de cintura.  Visite a su dentista si an no lo ha [place] manager. Use un cepillo de dientes blando para  higienizarse los dientes y psese el hilo dental con suavidad.  Puede seguir [employer], a menos que el mdico le indique lo contrario.  Concurra a todas las visitas prenatales segn las indicaciones de su mdico. SOLICITE ATENCIN MDICA SI:   Natale Milch.  Siente clicos leves, presin en la pelvis o dolor persistente en el abdomen.  Tiene nuseas, vmitos o diarrea persistentes.  Tiene secrecin vaginal con mal olor.  Siente dolor al [employer]. SOLICITE ATENCIN MDICA DE INMEDIATO SI:   Tiene fiebre.  Tiene una prdida de lquido por la vagina.  Tiene sangrado o pequeas prdidas vaginales.  Siente dolor intenso o clicos en el abdomen.  Sube o baja de peso rpidamente.  Tiene dificultad para respirar y siente dolor de pecho.  Sbitamente se le hinchan mucho el rostro, las [place], los tobillos, los pies o las piernas.  No ha sentido los movimientos del beb durante Leone Brand.  Siente un dolor de cabeza intenso que no se alivia con medicamentos.  Hay cambios en la visin. Document Released: 06/13/2005 Document Revised: 09/08/2013 [facility] Patient Information 2015 [place]. This information is not intended to replace advice given to you by your health care provider. Make sure you discuss any questions you have with your health care provider.

## 2014-04-27 ENCOUNTER — Encounter: Payer: Self-pay | Admitting: Obstetrics and Gynecology

## 2014-04-27 ENCOUNTER — Ambulatory Visit (INDEPENDENT_AMBULATORY_CARE_PROVIDER_SITE_OTHER): Payer: Self-pay | Admitting: Obstetrics and Gynecology

## 2014-04-27 VITALS — BP 114/58 | HR 74 | Wt 190.2 lb

## 2014-04-27 DIAGNOSIS — O09892 Supervision of other high risk pregnancies, second trimester: Secondary | ICD-10-CM

## 2014-04-27 DIAGNOSIS — Z3480 Encounter for supervision of other normal pregnancy, unspecified trimester: Secondary | ICD-10-CM

## 2014-04-27 DIAGNOSIS — Z3482 Encounter for supervision of other normal pregnancy, second trimester: Secondary | ICD-10-CM

## 2014-04-27 DIAGNOSIS — O09219 Supervision of pregnancy with history of pre-term labor, unspecified trimester: Secondary | ICD-10-CM

## 2014-04-27 DIAGNOSIS — O3421 Maternal care for scar from previous cesarean delivery: Secondary | ICD-10-CM

## 2014-04-27 DIAGNOSIS — O09212 Supervision of pregnancy with history of pre-term labor, second trimester: Secondary | ICD-10-CM

## 2014-04-27 DIAGNOSIS — O34219 Maternal care for unspecified type scar from previous cesarean delivery: Secondary | ICD-10-CM

## 2014-04-27 LAB — POCT URINALYSIS DIP (DEVICE)
Bilirubin Urine: NEGATIVE
GLUCOSE, UA: NEGATIVE mg/dL
HGB URINE DIPSTICK: NEGATIVE
Ketones, ur: NEGATIVE mg/dL
Leukocytes, UA: NEGATIVE
NITRITE: NEGATIVE
Protein, ur: NEGATIVE mg/dL
Specific Gravity, Urine: 1.01 (ref 1.005–1.030)
UROBILINOGEN UA: 0.2 mg/dL (ref 0.0–1.0)
pH: 7 (ref 5.0–8.0)

## 2014-04-27 NOTE — Progress Notes (Signed)
Pt reports pain in her side and some pelvic pain.

## 2014-04-27 NOTE — Progress Notes (Signed)
Patient is doing well without complaints. Patient opted to wait s/p ultrasound on 8/18 before performing quad screen

## 2014-05-04 ENCOUNTER — Ambulatory Visit (HOSPITAL_COMMUNITY)
Admission: RE | Admit: 2014-05-04 | Discharge: 2014-05-04 | Disposition: A | Discharge status: Home / Self Care | Payer: Self-pay | Source: Ambulatory Visit | Attending: Obstetrics & Gynecology | Admitting: Obstetrics & Gynecology

## 2014-05-04 DIAGNOSIS — Z3481 Encounter for supervision of other normal pregnancy, first trimester: Secondary | ICD-10-CM

## 2014-05-04 DIAGNOSIS — Z1389 Encounter for screening for other disorder: Secondary | ICD-10-CM

## 2014-05-04 DIAGNOSIS — O99210 Obesity complicating pregnancy, unspecified trimester: Secondary | ICD-10-CM

## 2014-05-04 DIAGNOSIS — Z3689 Encounter for other specified antenatal screening: Secondary | ICD-10-CM | POA: Insufficient documentation

## 2014-05-04 DIAGNOSIS — E669 Obesity, unspecified: Secondary | ICD-10-CM

## 2014-05-04 DIAGNOSIS — Z363 Encounter for antenatal screening for malformations: Secondary | ICD-10-CM

## 2014-05-05 ENCOUNTER — Telehealth: Payer: Self-pay

## 2014-05-05 DIAGNOSIS — Z3482 Encounter for supervision of other normal pregnancy, second trimester: Secondary | ICD-10-CM

## 2014-05-05 NOTE — Telephone Encounter (Signed)
F/u U/S scheduled for 06/09/14 at 1015. Called patient with Lorraine [name] and informed her of appointment date, time and location. Pt. Verbalized understanding. No questions or concerns.

## 2014-05-05 NOTE — Telephone Encounter (Signed)
Message copied by Geanie Logan on Wed May 05, 2014  4:17 PM ------      Message from: Lavonia Drafts      Created: Wed May 05, 2014 11:03 AM       Please schedule f/u sono in 4-6 weeks to complete anatomy            Thx,            clh-S ------

## 2014-05-13 ENCOUNTER — Telehealth: Payer: Self-pay | Admitting: *Deleted

## 2014-05-13 NOTE — Telephone Encounter (Signed)
Lorraine [name] called and reported she had gotten lice on her head from her children. Wanted to know what to take. Discussed with Dr. Harolyn Rutherford and per her instructions had interpreter tell patient may take over the counter product containing permethrin  Like Nix. 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  Patient voices understanding.

## 2014-05-17 ENCOUNTER — Inpatient Hospital Stay (HOSPITAL_COMMUNITY)
Admission: AD | Admit: 2014-05-17 | Discharge: 2014-05-18 | Disposition: A | Discharge status: Home / Self Care | Payer: Self-pay | Source: Ambulatory Visit | Attending: Obstetrics and Gynecology | Admitting: Obstetrics and Gynecology

## 2014-05-17 ENCOUNTER — Encounter (HOSPITAL_COMMUNITY): Payer: Self-pay | Admitting: *Deleted

## 2014-05-17 DIAGNOSIS — O9989 Other specified diseases and conditions complicating pregnancy, childbirth and the puerperium: Principal | ICD-10-CM

## 2014-05-17 DIAGNOSIS — O09892 Supervision of other high risk pregnancies, second trimester: Secondary | ICD-10-CM

## 2014-05-17 DIAGNOSIS — IMO0002 Reserved for concepts with insufficient information to code with codable children: Secondary | ICD-10-CM | POA: Insufficient documentation

## 2014-05-17 DIAGNOSIS — O09212 Supervision of pregnancy with history of pre-term labor, second trimester: Secondary | ICD-10-CM

## 2014-05-17 DIAGNOSIS — O34219 Maternal care for unspecified type scar from previous cesarean delivery: Secondary | ICD-10-CM

## 2014-05-17 DIAGNOSIS — Z3482 Encounter for supervision of other normal pregnancy, second trimester: Secondary | ICD-10-CM

## 2014-05-17 DIAGNOSIS — J069 Acute upper respiratory infection, unspecified: Secondary | ICD-10-CM | POA: Insufficient documentation

## 2014-05-17 DIAGNOSIS — O99891 Other specified diseases and conditions complicating pregnancy: Secondary | ICD-10-CM | POA: Insufficient documentation

## 2014-05-17 LAB — URINALYSIS, ROUTINE W REFLEX MICROSCOPIC
Bilirubin Urine: NEGATIVE
Glucose, UA: NEGATIVE mg/dL
HGB URINE DIPSTICK: NEGATIVE
Ketones, ur: NEGATIVE mg/dL
Leukocytes, UA: NEGATIVE
Nitrite: NEGATIVE
PROTEIN: NEGATIVE mg/dL
Specific Gravity, Urine: 1.01 (ref 1.005–1.030)
Urobilinogen, UA: 0.2 mg/dL (ref 0.0–1.0)
pH: 7.5 (ref 5.0–8.0)

## 2014-05-17 NOTE — Progress Notes (Signed)
I assisted Dr Angelica Chessman MD with a explanation  Of care plan and discharge papers too By Juliann Mule Spanish Interpreter

## 2014-05-17 NOTE — Discharge Instructions (Signed)
You were seen at [facility] for a viral infection. Your symptoms will improve within the next week. You should stay well hydrated and rest. You may take tylenol for fever or aches. You may use dextromethorphan as needed for cough. Please follow-up with your prrenatal provider as scheduled. Please call for sooner appointment if your symptoms worsen or fail to improve.

## 2014-05-17 NOTE — MAU Provider Note (Signed)
History     CSN: [id number]  Arrival date and time: 05/17/14 2217   None    HPI 28 year old [id number] at 21.0 by first trimester ultrasound who presents to MAU for evaluation of general malaise. History was obtained using Spanish interpreter. Usual state of health until yesterday when she developed general malaise, cough, and cold like symptoms. Felt feverish, but no documented fevers. Took tylenol earlier today. No known sick contacts, although has two small school age children at home. Denies shortness of breath, abdominal pain, nausea, vomiting, flank pain, dysuria, vaginal discharge or other concerning symptoms. +Fetal movement. No loss of fluid, contractions, vaginal bleeding.   OB History   Grav Para Term Preterm Abortions TAB SAB Ect Mult Living   5 2 1 1 2  2   2       Past Medical History  Diagnosis Date  . Diverticulitis     Past Surgical History  Procedure Laterality Date  . Cholecystectomy    . Cesarean section      Family History  Problem Relation Age of Onset  . Hyperlipidemia Father   . Diabetes Father     History  Substance Use Topics  . Smoking status: Never Smoker   . Smokeless tobacco: Never Used  . Alcohol Use: No    Allergies: No Known Allergies  Prescriptions prior to admission  Medication Sig Dispense Refill  . acetaminophen (TYLENOL) 325 MG tablet Take 325 mg by mouth every 6 (six) hours as needed for moderate pain.       . Prenatal Vit-Fe Fumarate-FA (MULTIVITAMIN-PRENATAL) 27-0.8 MG TABS tablet Take 1 tablet by mouth daily at 12 noon.        Review of Systems  Constitutional: Positive for chills and malaise/fatigue. Negative for fever and diaphoresis.  HENT: Positive for congestion. Negative for ear discharge, ear pain and sore throat.   Eyes: Negative.   Respiratory: Positive for cough. Negative for hemoptysis, sputum production, shortness of breath and wheezing.   Cardiovascular: Negative for chest pain, palpitations, orthopnea and leg  swelling.  Gastrointestinal: Negative.  Negative for nausea, vomiting, abdominal pain, diarrhea and constipation.  Genitourinary: Negative for dysuria, urgency and frequency.  Musculoskeletal: Positive for myalgias.  Skin: Negative for rash.  Neurological: Positive for headaches. Negative for dizziness and weakness.  Endo/Heme/Allergies: Negative.   All other systems reviewed and are negative.  Physical Exam   Blood pressure 102/50, pulse 88, temperature 98.8 F (37.1 C), temperature source Oral, resp. rate 20, height 4\' 11"  (1.499 m), weight 192 lb 8 oz (87.317 kg), last menstrual period 11/27/2013.  Physical Exam  Nursing note and vitals reviewed. Constitutional: She is oriented to person, place, and time. She appears well-developed and well-nourished. No distress.  HENT:  Head: Normocephalic and atraumatic.  Eyes: Conjunctivae are normal.  Neck: Neck supple.  Cardiovascular: Normal rate, regular rhythm, normal heart sounds and intact distal pulses.   No murmur heard. Respiratory: Effort normal and breath sounds normal. No respiratory distress. She has no wheezes. She has no rales. She exhibits no tenderness.  GI: Soft. She exhibits no distension. There is no tenderness. There is no rebound and no guarding.  Lymphadenopathy:    She has no cervical adenopathy.  Neurological: She is alert and oriented to person, place, and time.  Skin: Skin is warm and dry. No rash noted.  Psychiatric: She has a normal mood and affect. Her behavior is normal.    MAU Course  Procedures  MDM FHTs obtained and wnl  UA neg  Assessment and Plan  28 year old [id number] at 21.0 by first trimester ultrasound who presents with upper respiratory tract infection, likely viral in nature. Afebrile with stable vital signs. Non toxic appearing. Able to tolerate PO. FHTs obtained. - Stable for discharge to home.  - Recommend tylenol 1000 mg every 8 hours as needed for fever/pain; dextromethophan as needed for  cough.  - Recommend rest, plenty of hydration.  - Follow up in clinic as scheduled in 1 week or sooner if symptoms worsen or fail to improve.    Shelbie Hutching 05/17/2014, 11:45 PM

## 2014-05-17 NOTE — MAU Note (Addendum)
WITH INTERPRETER- ALEXANDRA-  PT SAYS   SHE HAS FEVER YESTERDAY-    NO THERM,     FEELS ACHING-  STARTED YESTERDAY-    TOOK TYLENOL TODAY AT 4PM-   1 REG TAB.   [facility]-   CLINIC.      DENIES V/D - BUT HAS COUGH - STARTED YESTERDAY-   DRY COUGH..   DOES NOT FEEL   GOOD

## 2014-05-25 ENCOUNTER — Encounter: Payer: Self-pay | Admitting: Obstetrics and Gynecology

## 2014-05-25 ENCOUNTER — Encounter: Payer: Self-pay | Admitting: Obstetrics & Gynecology

## 2014-05-25 ENCOUNTER — Ambulatory Visit (INDEPENDENT_AMBULATORY_CARE_PROVIDER_SITE_OTHER): Payer: Self-pay | Admitting: Obstetrics and Gynecology

## 2014-05-25 VITALS — BP 110/61 | HR 91 | Temp 98.3°F | Wt 190.6 lb

## 2014-05-25 DIAGNOSIS — Z3480 Encounter for supervision of other normal pregnancy, unspecified trimester: Secondary | ICD-10-CM

## 2014-05-25 DIAGNOSIS — Z3482 Encounter for supervision of other normal pregnancy, second trimester: Secondary | ICD-10-CM

## 2014-05-25 DIAGNOSIS — O09219 Supervision of pregnancy with history of pre-term labor, unspecified trimester: Secondary | ICD-10-CM

## 2014-05-25 DIAGNOSIS — O09892 Supervision of other high risk pregnancies, second trimester: Secondary | ICD-10-CM

## 2014-05-25 DIAGNOSIS — O3421 Maternal care for scar from previous cesarean delivery: Secondary | ICD-10-CM

## 2014-05-25 DIAGNOSIS — O09212 Supervision of pregnancy with history of pre-term labor, second trimester: Secondary | ICD-10-CM

## 2014-05-25 DIAGNOSIS — O34219 Maternal care for unspecified type scar from previous cesarean delivery: Secondary | ICD-10-CM

## 2014-05-25 LAB — POCT URINALYSIS DIP (DEVICE)
Bilirubin Urine: NEGATIVE
Glucose, UA: NEGATIVE mg/dL
HGB URINE DIPSTICK: NEGATIVE
Leukocytes, UA: NEGATIVE
Nitrite: NEGATIVE
PROTEIN: NEGATIVE mg/dL
Specific Gravity, Urine: 1.02 (ref 1.005–1.030)
Urobilinogen, UA: 0.2 mg/dL (ref 0.0–1.0)
pH: 6.5 (ref 5.0–8.0)

## 2014-05-25 NOTE — Progress Notes (Signed)
Patient is doing well without complaints. F/U anatomy ultrasound already scheduled. Third trimester labs next visit

## 2014-05-25 NOTE — Progress Notes (Signed)
C/o of occasional pelvic pressure and lower back pain.

## 2014-05-29 ENCOUNTER — Emergency Department (HOSPITAL_COMMUNITY)
Admission: EM | Admit: 2014-05-29 | Discharge: 2014-05-29 | Disposition: A | Discharge status: Home / Self Care | Payer: Self-pay | Attending: Emergency Medicine | Admitting: Emergency Medicine

## 2014-05-29 ENCOUNTER — Encounter (HOSPITAL_COMMUNITY): Payer: Self-pay | Admitting: Emergency Medicine

## 2014-05-29 DIAGNOSIS — R1031 Right lower quadrant pain: Secondary | ICD-10-CM | POA: Insufficient documentation

## 2014-05-29 DIAGNOSIS — Z8719 Personal history of other diseases of the digestive system: Secondary | ICD-10-CM | POA: Insufficient documentation

## 2014-05-29 DIAGNOSIS — O26899 Other specified pregnancy related conditions, unspecified trimester: Secondary | ICD-10-CM

## 2014-05-29 DIAGNOSIS — Z79899 Other long term (current) drug therapy: Secondary | ICD-10-CM | POA: Insufficient documentation

## 2014-05-29 DIAGNOSIS — O9989 Other specified diseases and conditions complicating pregnancy, childbirth and the puerperium: Secondary | ICD-10-CM | POA: Insufficient documentation

## 2014-05-29 LAB — CBC WITH DIFFERENTIAL/PLATELET
BASOS PCT: 0 % (ref 0–1)
Basophils Absolute: 0 10*3/uL (ref 0.0–0.1)
Eosinophils Absolute: 0 10*3/uL (ref 0.0–0.7)
Eosinophils Relative: 0 % (ref 0–5)
HCT: 29.1 % — ABNORMAL LOW (ref 36.0–46.0)
Hemoglobin: 9.8 g/dL — ABNORMAL LOW (ref 12.0–15.0)
Lymphocytes Relative: 22 % (ref 12–46)
Lymphs Abs: 2.1 10*3/uL (ref 0.7–4.0)
MCH: 30.6 pg (ref 26.0–34.0)
MCHC: 33.7 g/dL (ref 30.0–36.0)
MCV: 90.9 fL (ref 78.0–100.0)
Monocytes Absolute: 0.4 10*3/uL (ref 0.1–1.0)
Monocytes Relative: 4 % (ref 3–12)
NEUTROS PCT: 74 % (ref 43–77)
Neutro Abs: 6.7 10*3/uL (ref 1.7–7.7)
PLATELETS: 225 10*3/uL (ref 150–400)
RBC: 3.2 MIL/uL — ABNORMAL LOW (ref 3.87–5.11)
RDW: 12.6 % (ref 11.5–15.5)
WBC: 9.2 10*3/uL (ref 4.0–10.5)

## 2014-05-29 LAB — COMPREHENSIVE METABOLIC PANEL WITH GFR
ALT: 11 U/L (ref 0–35)
AST: 17 U/L (ref 0–37)
Albumin: 2.7 g/dL — ABNORMAL LOW (ref 3.5–5.2)
Alkaline Phosphatase: 88 U/L (ref 39–117)
Anion gap: 15 (ref 5–15)
BUN: 5 mg/dL — ABNORMAL LOW (ref 6–23)
CO2: 20 meq/L (ref 19–32)
Calcium: 8.9 mg/dL (ref 8.4–10.5)
Chloride: 102 meq/L (ref 96–112)
Creatinine, Ser: 0.53 mg/dL (ref 0.50–1.10)
GFR calc Af Amer: >90 mL/min
GFR calc non Af Amer: >90 mL/min
Glucose, Bld: 89 mg/dL (ref 70–99)
Potassium: 3.7 meq/L (ref 3.7–5.3)
Sodium: 137 meq/L (ref 137–147)
Total Bilirubin: <0.2 mg/dL — ABNORMAL LOW (ref 0.3–1.2)
Total Protein: 6.4 g/dL (ref 6.0–8.3)

## 2014-05-29 LAB — URINALYSIS, ROUTINE W REFLEX MICROSCOPIC
Bilirubin Urine: NEGATIVE
Glucose, UA: NEGATIVE mg/dL
Hgb urine dipstick: NEGATIVE
Ketones, ur: 40 mg/dL — AB
Leukocytes, UA: NEGATIVE
NITRITE: NEGATIVE
PH: 7 (ref 5.0–8.0)
Protein, ur: NEGATIVE mg/dL
SPECIFIC GRAVITY, URINE: 1.008 (ref 1.005–1.030)
Urobilinogen, UA: 0.2 mg/dL (ref 0.0–1.0)

## 2014-05-29 MED ORDER — ACETAMINOPHEN 325 MG PO TABS
650.0000 mg | ORAL_TABLET | Freq: Once | ORAL | Status: AC
  Administered 2014-05-29: 650 mg via ORAL
  Filled 2014-05-29: qty 2

## 2014-05-29 NOTE — ED Notes (Signed)
Rapid OB RN at bedside.  

## 2014-05-29 NOTE — Progress Notes (Addendum)
Pt presents with c/o uc's for the past 2 days. No vaginal bleeding or leaking of fluid. Fetal heart tones 160 BPM by doppler.Pt has had to previous c/s and 2 prior losses at 8 weeks and 12 weeks. She has a due date of 09/29/14 which puts her at 22 3/7 weeks. Toco applied. Using spanish interpreter through [employer]. ID [id number].Pt is for a repeat c/s at 39 weeks.

## 2014-05-29 NOTE — Progress Notes (Signed)
Spoke with Dr. Tawnya Crook. Pt is obstetrically cleared and can be dc'd home when she is finished with her work up.

## 2014-05-29 NOTE — Discharge Instructions (Signed)
Dolor abdominal en el embarazo °(Abdominal Pain During Pregnancy) °El dolor abdominal es frecuente durante el embarazo. Generalmente no causa ningún daño. El dolor abdominal puede tener numerosas causas. Algunas causas son más graves que otras. Ciertas causas de dolor abdominal durante el embarazo se diagnostican fácilmente. A veces, se tarda un tiempo para llegar al diagnóstico. Otras veces la causa no se conoce. El dolor abdominal puede estar relacionado con alguna alteración del embarazo, o puede deberse a una causa totalmente diferente. Por este motivo, siempre consulte a su médico cuando sienta molestias abdominales. °INSTRUCCIONES PARA EL CUIDADO EN EL HOGAR  °Esté atenta al dolor para ver si hay cambios. Las siguientes indicaciones ayudarán a aliviar cualquier molestia que pueda sentir: °· No tenga relaciones sexuales y no coloque nada dentro de la vagina hasta que los síntomas hayan desaparecido completamente. °· Descanse todo lo que pueda hasta que el dolor se le haya calmado. °· Si siente náuseas, beba líquidos claros. Evite los alimentos sólidos mientras sienta malestar o tenga náuseas. °· Tome sólo medicamentos de venta libre o recetados, según las indicaciones del médico. °· Cumpla con todas las visitas de control, según le indique su médico. °SOLICITE ATENCIÓN MÉDICA DE INMEDIATO SI: °· Tiene un sangrado, pérdida de líquidos o elimina tejidos por la vagina. °· El dolor o los cólicos aumentan. °· Tiene vómitos persistentes. °· Comienza a sentir dolor al orinar u observa sangre. °· Tiene fiebre. °· Nota que los movimientos del bebé disminuyen. °· Siente intensa debilidad o se marea. °· Tiene dificultad para respirar con o sin dolor abdominal. °· Siente un dolor de cabeza intenso junto al dolor abdominal. °· Tiene una secreción vaginal anormal con dolor abdominal. °· Tiene diarrea persistente. °· El dolor abdominal sigue o empeora aún después de hacer reposo. °ASEGÚRESE DE QUE:  °· Comprende estas  instrucciones. °· Controlará su afección. °· Recibirá ayuda de inmediato si no mejora o si empeora. °Document Released: 09/03/2005 Document Revised: 06/24/2013 °ExitCare® Patient Information ©2015 [employer]. This information is not intended to replace advice given to you by your health care provider. Make sure you discuss any questions you have with your health care provider. ° °

## 2014-05-29 NOTE — Progress Notes (Signed)
Using Spanish interpreter ID [phone] through [employer]. Pt told that her baby is fine, and she is not having any contractions. She was also instructed to come to [facility] if she continues to have pain or any other problems.

## 2014-05-29 NOTE — ED Provider Notes (Signed)
CSN: [id number]     Arrival date & time 05/29/14  1209 History   First MD Initiated Contact with Patient 05/29/14 1309     Chief Complaint  Patient presents with  . Abdominal Pain     (Consider location/radiation/quality/duration/timing/severity/associated sxs/prior Treatment) Patient is a 28 y.o. female presenting with abdominal pain. The history is provided by the patient. A language interpreter was used.  Abdominal Pain Pain location:  RLQ Pain quality: sharp   Pain radiates to:  Does not radiate Pain severity:  Moderate Duration:  2 days Timing:  Intermittent Progression:  Waxing and waning (lats 5-6 mins, several times an hour) Chronicity:  New (though has presented for same during this pregnancy on chart reviwe) Relieved by:  Nothing Worsened by:  Nothing tried Ineffective treatments:  None tried Associated symptoms: nausea and vaginal discharge (cleaar, unchanged)   Associated symptoms: no anorexia, no chest pain, no chills, no constipation, no cough, no diarrhea, no dysuria, no fatigue, no fever, no hematuria, no shortness of breath, no sore throat, no vaginal bleeding and no vomiting   Risk factors: pregnancy     Past Medical History  Diagnosis Date  . Diverticulitis    Past Surgical History  Procedure Laterality Date  . Cholecystectomy    . Cesarean section     Family History  Problem Relation Age of Onset  . Hyperlipidemia Father   . Diabetes Father    History  Substance Use Topics  . Smoking status: Never Smoker   . Smokeless tobacco: Never Used  . Alcohol Use: No   OB History   Grav Para Term Preterm Abortions TAB SAB Ect Mult Living   5 2 1 1 2  2   2      Review of Systems  Constitutional: Negative for fever, chills, diaphoresis, activity change, appetite change and fatigue.  HENT: Negative for congestion, facial swelling, rhinorrhea and sore throat.   Eyes: Negative for photophobia and discharge.  Respiratory: Negative for cough, chest tightness  and shortness of breath.   Cardiovascular: Negative for chest pain, palpitations and leg swelling.  Gastrointestinal: Positive for nausea and abdominal pain. Negative for vomiting, diarrhea, constipation and anorexia.  Endocrine: Negative for polydipsia and polyuria.  Genitourinary: Positive for vaginal discharge (cleaar, unchanged). Negative for dysuria, frequency, hematuria, vaginal bleeding, difficulty urinating and pelvic pain.  Musculoskeletal: Negative for arthralgias, back pain, neck pain and neck stiffness.  Skin: Negative for color change and wound.  Allergic/Immunologic: Negative for immunocompromised state.  Neurological: Negative for facial asymmetry, weakness, numbness and headaches.  Hematological: Does not bruise/bleed easily.  Psychiatric/Behavioral: Negative for confusion and agitation.      Allergies  Review of patient's allergies indicates no known allergies.  Home Medications   Prior to Admission medications   Medication Sig Start Date End Date Taking? Authorizing Provider  acetaminophen (TYLENOL) 325 MG tablet Take 325 mg by mouth every 6 (six) hours as needed for moderate pain.    Yes Historical Provider, MD  Prenatal Vit-Fe Fumarate-FA (MULTIVITAMIN-PRENATAL) 27-0.8 MG TABS tablet Take 1 tablet by mouth daily at 12 noon.   Yes Historical Provider, MD   BP 103/49  Pulse 80  Temp(Src) 98.7 F (37.1 C) (Oral)  Resp 16  SpO2 100%  LMP 11/27/2013 Physical Exam  Constitutional: She is oriented to person, place, and time. She appears well-developed and well-nourished. No distress.  HENT:  Head: Normocephalic and atraumatic.  Mouth/Throat: No oropharyngeal exudate.  Eyes: Pupils are equal, round, and reactive to light.  Neck: Normal range of motion. Neck supple.  Cardiovascular: Normal rate, regular rhythm and normal heart sounds.  Exam reveals no gallop and no friction rub.   No murmur heard. Pulmonary/Chest: Effort normal and breath sounds normal. No  respiratory distress. She has no wheezes. She has no rales.  Abdominal: Soft. Bowel sounds are normal. She exhibits no distension and no mass. There is tenderness in the right lower quadrant. There is no rigidity, no rebound and no guarding.    Musculoskeletal: Normal range of motion. She exhibits no edema and no tenderness.  Neurological: She is alert and oriented to person, place, and time.  Skin: Skin is warm and dry.  Psychiatric: She has a normal mood and affect.    ED Course  Procedures (including critical care time) Labs Review Labs Reviewed  CBC WITH DIFFERENTIAL - Abnormal; Notable for the following:    RBC 3.20 (*)    Hemoglobin 9.8 (*)    HCT 29.1 (*)    All other components within normal limits  COMPREHENSIVE METABOLIC PANEL - Abnormal; Notable for the following:    BUN 5 (*)    Albumin 2.7 (*)    Total Bilirubin <0.2 (*)    All other components within normal limits  URINALYSIS, ROUTINE W REFLEX MICROSCOPIC - Abnormal; Notable for the following:    Ketones, ur 40 (*)    All other components within normal limits  URINE CULTURE    Imaging Review No results found.   EKG Interpretation None     EMERGENCY DEPARTMENT [place] PREGNANCY "Study: Limited Ultrasound of the Pelvis for Pregnancy"  INDICATIONS:Pregnancy(required) and Pelvic pain Multiple views of the uterus and pelvic cavity were obtained in real-time with a multi-frequency probe.  APPROACH:Transabdominal   PERFORMED BY: Myself  IMAGES ARCHIVED?: Yes  LIMITATIONS: Decompressed bladder  PREGNANCY FREE FLUID: None  ADNEXAL FINDINGS:Left ovary not seen and Right ovary not seen  PREGNANCY FINDINGS: Fetal heart activity seen  INTERPRETATION: Viable intrauterine pregnancy  GESTATIONAL AGE, ESTIMATE: 154  FETAL HEART RATE: 25w by BPD, 22 by dates     MDM   Final diagnoses:  Pregnancy with abdominal pain of right lower quadrant, antepartum    Pt is a 28 y.o. female with Pmhx as above, G3P2002  at 22 weeks, or who presents with 2 days of intermittent right lower quadrant pain described as sharp/stabbing without radiation and without associated symptoms. Pain lasts 5-6 mins and occurs several times an hour. No aggravating or alleviating symptoms. Patient reports she has not had a history of similar other has been seen at the MAU for similar during this pregnancy in chart review. On physical exam vital signs are stable and she is in no acute distress. She has right lower quadrant tenderness palpation without rebound or guarding. Abdomen Is gravid.   POCUS reassuring, no acute lab or UA findings. Specially WBC nml. Presentation is not c/w acute surgical emergency such as appendicitis, diverticulitis, SBO, and also no c/w labor. She is safe to d/c home w/ trial of tylenol and BP f/u. Return precautions given for new or worsening symptoms including worsening pain, fever, loss of fluids, vaginal bleeding.          Ernestina Patches, MD 05/30/14 [id number]

## 2014-05-29 NOTE — ED Notes (Addendum)
Pt reports for the past two days she has had right, lower abdominal pain which she describes as a "stabbing pain". Pt reports the pain started two days ago as a mild pain, however worsened today. Pt reports being [redacted] weeks pregnant. Pt denies vaginal bleeding or discharge. Pt is A/O x4, in NAD, and vitals are WDL. Pt reports a history of diverticulitis. Pt reports a gallbladder removal in 2010. Triage was completed using pacific interpreter - spanish.

## 2014-05-29 NOTE — ED Notes (Addendum)
Pt reports that she receives prenatal care at [facility]. Pt denies vomiting but is nauseated. She reports lower abdominal pain and contractions that started yesterday. Also c/o white to clear vaginal discharge but denies bleeding. Interpreter phones used for communication as patient is spanish speaking.

## 2014-05-29 NOTE — Progress Notes (Signed)
Spoke with Dr. Harolyn Rutherford. Pt is 22 3/[redacted] weeks pregnant with c/o of lower abd pain for 2 days. No vag bleeding, no leaking of fluid. FHT by doppler 160 BPM. No uc's noted on FM. Abd soft to palpation since toco applied at 1250. Bedside U/S done by Dr. Tawnya Crook. Pos fetal movement and pos cardiac motion. Urinalysis and blood work to be done. Pt is obstetrically cleared and can be d/c when the ED MD is finished with her work up.

## 2014-05-29 NOTE — ED Notes (Signed)
MD at bedside. 

## 2014-05-29 NOTE — Progress Notes (Signed)
Dr. Tawnya Crook in. Bedside u/s done. Using spanish interpreter through [employer].Pos fetal movement. Pos cardiac motion.

## 2014-05-30 LAB — URINE CULTURE

## 2014-06-01 ENCOUNTER — Telehealth: Payer: Self-pay | Admitting: General Practice

## 2014-06-01 ENCOUNTER — Inpatient Hospital Stay (HOSPITAL_COMMUNITY)
Admission: AD | Admit: 2014-06-01 | Discharge: 2014-06-02 | Disposition: A | Discharge status: Home / Self Care | Payer: Self-pay | Source: Ambulatory Visit | Attending: Obstetrics & Gynecology | Admitting: Obstetrics & Gynecology

## 2014-06-01 ENCOUNTER — Encounter (HOSPITAL_COMMUNITY): Payer: Self-pay | Admitting: *Deleted

## 2014-06-01 DIAGNOSIS — B373 Candidiasis of vulva and vagina: Secondary | ICD-10-CM | POA: Insufficient documentation

## 2014-06-01 DIAGNOSIS — O2390 Unspecified genitourinary tract infection in pregnancy, unspecified trimester: Secondary | ICD-10-CM | POA: Insufficient documentation

## 2014-06-01 DIAGNOSIS — Z3482 Encounter for supervision of other normal pregnancy, second trimester: Secondary | ICD-10-CM

## 2014-06-01 DIAGNOSIS — R109 Unspecified abdominal pain: Secondary | ICD-10-CM | POA: Insufficient documentation

## 2014-06-01 DIAGNOSIS — B3731 Acute candidiasis of vulva and vagina: Secondary | ICD-10-CM | POA: Insufficient documentation

## 2014-06-01 LAB — URINALYSIS, ROUTINE W REFLEX MICROSCOPIC
BILIRUBIN URINE: NEGATIVE
Glucose, UA: NEGATIVE mg/dL
HGB URINE DIPSTICK: NEGATIVE
KETONES UR: NEGATIVE mg/dL
Leukocytes, UA: NEGATIVE
Nitrite: NEGATIVE
PH: 7 (ref 5.0–8.0)
Protein, ur: NEGATIVE mg/dL
Specific Gravity, Urine: 1.015 (ref 1.005–1.030)
Urobilinogen, UA: 1 mg/dL (ref 0.0–1.0)

## 2014-06-01 NOTE — Telephone Encounter (Signed)
Patient called in to front office to [facility] stating she occasionally has a cramp in her belly that goes into her back and a spot on her belly would get hard sometimes. Asked how often patient feels this sensation. Patient states she doesn't know. Told patient to monitor when she gets that feeling to see if she is having regular contractions. Told patient that if she is having contractions or severe pain to go to MAU for further evaluation. Patient verbalized understanding and had no other questions

## 2014-06-01 NOTE — MAU Provider Note (Signed)
History     CSN: [id number]  Arrival date and time: 06/01/14 2241   First Provider Initiated Contact with Patient 06/01/14 2336      Chief Complaint  Patient presents with  . Abdominal Pain   HPI  This is a 28 yo G5P1122 at [place] 23.1 by [redacted]w[redacted]d sono who presents to MAU for evaluation of lower abdominal pain. Onset of symptoms 4 days ago. Reports pain has worsened since onset and located in the lower abdomen region. Worse with walking. No vaginal bleeding. Reports clear vaginal discharge. Has had a dry cough with nausea, no fevers or vomiting. Good fetal movement. No leakage of fluid,contractions, or dysuria.  Past Medical History  Diagnosis Date  . Diverticulitis     Past Surgical History  Procedure Laterality Date  . Cholecystectomy    . Cesarean section      Family History  Problem Relation Age of Onset  . Hyperlipidemia Father   . Diabetes Father     History  Substance Use Topics  . Smoking status: Never Smoker   . Smokeless tobacco: Never Used  . Alcohol Use: No    Allergies: No Known Allergies  Prescriptions prior to admission  Medication Sig Dispense Refill  . acetaminophen (TYLENOL) 325 MG tablet Take 325 mg by mouth every 6 (six) hours as needed for moderate pain.       . Prenatal Vit-Fe Fumarate-FA (MULTIVITAMIN-PRENATAL) 27-0.8 MG TABS tablet Take 1 tablet by mouth daily at 12 noon.        Review of Systems  Constitutional: Negative for fever and chills.  Respiratory: Positive for cough. Negative for hemoptysis, sputum production and shortness of breath.   Gastrointestinal: Positive for nausea and abdominal pain. Negative for vomiting.  Genitourinary: Negative for dysuria.  Musculoskeletal: Negative for back pain.   Physical Exam   Blood pressure 121/61, pulse 102, temperature 98.2 F (36.8 C), temperature source Oral, resp. rate 18, last menstrual period 11/27/2013.  Physical Exam  Constitutional: She is oriented to person, place, and time. She  appears well-developed and well-nourished.  Eyes: Conjunctivae and EOM are normal.  Neck: Normal range of motion.  Cardiovascular: Normal rate, regular rhythm and normal heart sounds.   Respiratory: Effort normal and breath sounds normal. No respiratory distress. She has no wheezes.  GI: Soft. Bowel sounds are normal. There is no tenderness. There is no rebound and no guarding.  Genitourinary: Vagina normal and uterus normal.  No fundal tenderness SSE: normal appearing cervical discharge noted in posterior fornix, no bleeding or lesions SVE: closed/thick/high  Musculoskeletal: She exhibits no edema.  Neurological: She is alert and oriented to person, place, and time.  Skin: Skin is warm and dry.   TOCO: no contractions FHTs: baseline FHR in the 150s with moderate variability, 10X10 accelerations, no decelerations MAU Course  Procedures    UA neg Wet prep with evidence of vaginal candidiasis.  Reviewed previous workup performed in the ER 3 days ago and discussed lab results with patient.  Assessment and Plan   This is a 28 yo G5P1122 at [place] 23.1 by [redacted]w[redacted]d sono who presents to MAU for evaluation of lower abdominal pain.  1. IUP at 23.1. -Not in labor. -PTL precautions given.  2. FWB. -Category I strip. -Fetal kick counts precautions reviewed.  3. Abdominal pain.  -Suspect some component of round ligament pain. -Encouraged tylenol prn. -PTL precautions addressed.  4. Candidiasis. Given rx for diflucan.  Carman Ching, MD, PGY3 06/01/2014, 11:54 PM   I  have seen the patient with the resident/student and agree with the above.  Mathis Bud

## 2014-06-01 NOTE — MAU Note (Signed)
Pt states that she began to experience abdominal pain 4 days ago but today it is more intense lastein this way since the afternoon. Pt denies bleeding, says that she is having a small amount of clear discharge.

## 2014-06-02 LAB — WET PREP, GENITAL
CLUE CELLS WET PREP: NONE SEEN
TRICH WET PREP: NONE SEEN
WBC WET PREP: NONE SEEN

## 2014-06-02 LAB — RAPID HIV SCREEN (WH-MAU): SUDS RAPID HIV SCREEN: NONREACTIVE

## 2014-06-02 MED ORDER — FLUCONAZOLE 150 MG PO TABS: 150.0000 mg | ORAL_TABLET | Freq: Every day | ORAL | Status: DC

## 2014-06-03 LAB — GC/CHLAMYDIA PROBE AMP
CT Probe RNA: NEGATIVE
GC PROBE AMP APTIMA: NEGATIVE

## 2014-06-09 ENCOUNTER — Ambulatory Visit (HOSPITAL_COMMUNITY)
Admission: RE | Admit: 2014-06-09 | Discharge: 2014-06-09 | Disposition: A | Discharge status: Home / Self Care | Payer: Self-pay | Source: Ambulatory Visit | Attending: Obstetrics & Gynecology | Admitting: Obstetrics & Gynecology

## 2014-06-09 DIAGNOSIS — Z3689 Encounter for other specified antenatal screening: Secondary | ICD-10-CM | POA: Insufficient documentation

## 2014-06-09 DIAGNOSIS — O99212 Obesity complicating pregnancy, second trimester: Secondary | ICD-10-CM

## 2014-06-09 DIAGNOSIS — O99210 Obesity complicating pregnancy, unspecified trimester: Secondary | ICD-10-CM

## 2014-06-09 DIAGNOSIS — Z3482 Encounter for supervision of other normal pregnancy, second trimester: Secondary | ICD-10-CM

## 2014-06-09 DIAGNOSIS — O358XX0 Maternal care for other (suspected) fetal abnormality and damage, not applicable or unspecified: Secondary | ICD-10-CM | POA: Insufficient documentation

## 2014-06-09 DIAGNOSIS — E669 Obesity, unspecified: Secondary | ICD-10-CM | POA: Insufficient documentation

## 2014-06-17 ENCOUNTER — Encounter (HOSPITAL_COMMUNITY): Payer: Self-pay | Admitting: *Deleted

## 2014-06-17 ENCOUNTER — Telehealth: Payer: Self-pay | Admitting: *Deleted

## 2014-06-17 ENCOUNTER — Inpatient Hospital Stay (HOSPITAL_COMMUNITY)
Admission: AD | Admit: 2014-06-17 | Discharge: 2014-06-17 | Disposition: A | Discharge status: Home / Self Care | Payer: Self-pay | Source: Ambulatory Visit | Attending: Obstetrics & Gynecology | Admitting: Obstetrics & Gynecology

## 2014-06-17 DIAGNOSIS — K219 Gastro-esophageal reflux disease without esophagitis: Secondary | ICD-10-CM | POA: Insufficient documentation

## 2014-06-17 DIAGNOSIS — Z9049 Acquired absence of other specified parts of digestive tract: Secondary | ICD-10-CM | POA: Insufficient documentation

## 2014-06-17 DIAGNOSIS — R1013 Epigastric pain: Secondary | ICD-10-CM | POA: Insufficient documentation

## 2014-06-17 DIAGNOSIS — O99612 Diseases of the digestive system complicating pregnancy, second trimester: Secondary | ICD-10-CM

## 2014-06-17 LAB — CBC
HCT: 30.6 % — ABNORMAL LOW (ref 36.0–46.0)
HEMOGLOBIN: 10.3 g/dL — AB (ref 12.0–15.0)
MCH: 30.9 pg (ref 26.0–34.0)
MCHC: 33.7 g/dL (ref 30.0–36.0)
MCV: 91.9 fL (ref 78.0–100.0)
Platelets: 247 10*3/uL (ref 150–400)
RBC: 3.33 MIL/uL — AB (ref 3.87–5.11)
RDW: 12.9 % (ref 11.5–15.5)
WBC: 9.6 10*3/uL (ref 4.0–10.5)

## 2014-06-17 LAB — COMPREHENSIVE METABOLIC PANEL
ALK PHOS: 99 U/L (ref 39–117)
ALT: 13 U/L (ref 0–35)
AST: 18 U/L (ref 0–37)
Albumin: 2.9 g/dL — ABNORMAL LOW (ref 3.5–5.2)
Anion gap: 13 (ref 5–15)
BUN: 9 mg/dL (ref 6–23)
CO2: 21 meq/L (ref 19–32)
Calcium: 9.3 mg/dL (ref 8.4–10.5)
Chloride: 104 mEq/L (ref 96–112)
Creatinine, Ser: 0.56 mg/dL (ref 0.50–1.10)
GLUCOSE: 97 mg/dL (ref 70–99)
Potassium: 3.5 mEq/L — ABNORMAL LOW (ref 3.7–5.3)
SODIUM: 138 meq/L (ref 137–147)
Total Bilirubin: <0.2 mg/dL — ABNORMAL LOW (ref 0.3–1.2)
Total Protein: 6.9 g/dL (ref 6.0–8.3)

## 2014-06-17 LAB — URINALYSIS, ROUTINE W REFLEX MICROSCOPIC
BILIRUBIN URINE: NEGATIVE
Glucose, UA: NEGATIVE mg/dL
HGB URINE DIPSTICK: NEGATIVE
Ketones, ur: NEGATIVE mg/dL
Leukocytes, UA: NEGATIVE
NITRITE: NEGATIVE
PH: 6 (ref 5.0–8.0)
Protein, ur: NEGATIVE mg/dL
Specific Gravity, Urine: 1.02 (ref 1.005–1.030)
UROBILINOGEN UA: 0.2 mg/dL (ref 0.0–1.0)

## 2014-06-17 MED ORDER — FAMOTIDINE 20 MG PO TABS: 20.0000 mg | ORAL_TABLET | Freq: Two times a day (BID) | ORAL | Status: DC

## 2014-06-17 MED ORDER — GI COCKTAIL ~~LOC~~
30.0000 mL | Freq: Three times a day (TID) | ORAL | Status: DC | PRN
  Administered 2014-06-17: 30 mL via ORAL
  Filled 2014-06-17: qty 30

## 2014-06-17 NOTE — Telephone Encounter (Signed)
Khaliya called front desk and transferred to nurse. Spoke with [place] with interpreter Anastasio Auerbach.  C/o having pain that started yesterday and she thinks is contractions- states is more frequent at night.Also c/o urinary frequency scant amounts - denies pain with urination- c/o brownish urine.  Asked patient if it feels like diverticulitis flare up and she states no, is totally different.  We discussed could be uti, but with c/o contractions need to rule out preterm labor- has history of preterm delivery due to nuchal cord 27 wk.  Advised pateint to come to MAU for evaluation. Shyonna agrees with plain.

## 2014-06-17 NOTE — MAU Provider Note (Signed)
Attestation of Attending Supervision of Advanced Practitioner (PA/CNM/NP): Evaluation and management procedures were performed by the Advanced Practitioner under my supervision and collaboration.  I have reviewed the Advanced Practitioner's note and chart, and I agree with the management and plan.  Nickey Canedo, MD, FACOG Attending Obstetrician & Gynecologist Faculty Practice, [facility]   

## 2014-06-17 NOTE — MAU Note (Signed)
Frequency of urination, pressure after urination- no pain

## 2014-06-17 NOTE — Discharge Instructions (Signed)
Reflujo gastroesofágico - Adultos  °(Gastroesophageal Reflux Disease, Adult) ° El reflujo gastroesofágico ocurre cuando el ácido del estómago pasa al esófago. Cuando el ácido entra en contacto con el esófago, el ácido provoca dolor (inflamación) en el esófago. Con el tiempo, pueden formarse pequeños agujeros (úlceras) en el revestimiento del esófago. °CAUSAS  °· Exceso de peso corporal. Esto aplica presión sobre el estómago, lo que hace que el ácido del estómago suba hacia el esófago. °· El hábito de fumar Aumenta la producción de ácido en el estómago. °· El consumo de alcohol. Provoca disminución de la presión en el esfínter esofágico inferior (válvula o anillo de músculo entre el esófago y el estómago), permitiendo que el ácido del estómago suba hacia el esófago. °· Cenas a última hora del día y estómago lleno. Aumenta la presión y la producción de ácido en el estómago. °· Malformación en el esfínter esofágico inferior. °A menudo no se halla causa.  °SÍNTOMAS  °· Ardor y dolor en la parte inferior del pecho detrás del esternón y en la zona media del estómago. Puede ocurrir dos veces por semana o más a menudo. °· Dificultad para tragar. °· Dolor de garganta. °· Tos seca. °· Síntomas similares al asma que incluyen sensación de opresión en el pecho, falta de aire y sibilancias. °DIAGNÓSTICO  °El médico diagnosticará el problema basándose en los síntomas. En algunos casos, se indican radiografías y otras pruebas para verificar si hay complicaciones o para comprobar el estado del estómago y el esófago.  °TRATAMIENTO  °El médico le indicará medicamentos de venta libre o recetados para ayudar a disminuir la producción de ácido. Consulte con su médico antes de empezar o agregar cualquier medicamento nuevo.  °INSTRUCCIONES PARA EL CUIDADO EN EL HOGAR  °· Modifique los factores que pueda cambiar. Consulte con su médico para solicitar orientación relacionada con la pérdida de peso, dejar de fumar y el consumo de  alcohol. °· Evite las comidas y bebidas que empeoran los problemas, como: °¨ Bebidas con cafeína o alcohólicas. °¨ Chocolate. °¨ Sabores a menta. °¨ Ajo y cebolla. °¨ Comidas muy condimentadas. °¨ Cítricos como naranjas, limones o limas. °¨ Alimentos que contengan tomate, como salsas, chile y pizza. °¨ Alimentos fritos y grasos. °· Evite acostarse durante 3 horas antes de irse a dormir o antes de tomar una siesta. °· Haga comidas pequeñas durante el día en lugar de 3 comidas abundantes. °· Use ropas sueltas. No use nada apretado alrededor de la cintura que cause presión en el estómago. °· Levante (eleve) la cabecera de la cama 6 a 8 pulgadas (15 a 20 cm) con bloques de madera. Usar almohadas extra no ayuda. °· Solo tome medicamentos que se pueden comprar sin receta o recetados para el dolor, malestar o fiebre, como le indica el médico. °· No tome aspirina, ibuprofeno ni antiinflamatorios no esteroides. °SOLICITE ATENCIÓN MÉDICA DE INMEDIATO SI:  °· Siente dolor en los brazos, el cuello, la mandíbula, los dientes o la espalda. °· El dolor aumenta o cambia la intensidad o la duranción. °· Tiene náuseas, vómitos o sudoración(diaforesis). °· Siente falta de aire o dolor en el pecho, o se desmaya. °· Vomita y el vómito tiene sangre, es de color verde, amarillo, negro o es similar a la borra del café o tiene sangre. °· Las heces son rojas, sanguinolentas o negras. °Estos síntomas pueden ser signos de otros problemas, como enfermedades cardíacas, hemorragias gástrias o sangrado esofágico.  °ASEGÚRESE DE QUE:  °· Comprende estas instrucciones. °· Controlará su enfermedad. °·   Solicitar ayuda de inmediato si no mejora o si empeora. Document Released: 06/13/2005 Document Revised: 11/26/2011 [facility] Patient Information 2015 [place]. This information is not intended to replace advice given to you by your health care provider. Make sure you discuss any questions you have with your health care provider.   Opciones  de alimentos para pacientes con reflujo gastroesofgico (Food Choices for Gastroesophageal Reflux Disease) Cuando se tiene reflujo gastroesofgico (ERGE), los alimentos que se ingieren y los hbitos de alimentacin son muy importantes. Elegir los alimentos adecuados puede ayudar a Public house manager las molestias ocasionadas por el [place]. [place]?  Elija las frutas, los vegetales, los cereales integrales, los productos lcteos, la carne de [place], de pescado y de ave con bajo contenido de grasas.  Limite las grasas, como los [name], los aderezos para [place], la [place], los frutos secos y Publishing copy.  Lleve un registro de las comidas para identificar los alimentos que ocasionan sntomas.  Evite los alimentos que le ocasionen reflujo. Pueden ser distintos para cada persona.  Haga comidas pequeas con frecuencia en lugar de tres comidas Kellogg.  Coma lentamente, en un clima distendido.  Limite el consumo de alimentos fritos.  Cocine los alimentos utilizando mtodos que no sean la fritura.  Evite el consumo alcohol.  Evite beber grandes cantidades de lquidos con las comidas.  Evite agacharse o recostarse hasta despus de 2 o 3horas de haber comido. QU ALIMENTOS NO SE RECOMIENDAN? Los siguientes son algunos alimentos y bebidas que pueden empeorar los sntomas: Astronomer. Jugo de tomate. Salsa de tomate y espagueti. Ajes. Cebolla y [place]. Rbano picante. Frutas Naranjas, pomelos y limn (fruta y [place]). Carnes Carnes de [place], de pescado y de ave con gran contenido de grasas. Esto incluye los perros calientes, las [place], el [place], la salchicha, el salame y el tocino. Lcteos Leche entera y [place]. [employer]. Crema. [place]. Helados. Queso crema.  Bebidas Caf y t negro, con o sin cafena Bebidas gaseosas o energizantes. Condimentos Salsa picante. Salsa barbacoa.  Dulces/postres Chocolate y cacao. [place]. Menta y  mentol. Grasas y [employer] con alto contenido de grasas, incluidas las papas fritas. Otros [place]. Especias picantes, como la [employer], la pimienta blanca, la pimienta roja, la pimienta de cayena, el curry en [place], los clavos de [place], el jengibre y el [place] en polvo. Los artculos mencionados arriba pueden no ser [employer] de las bebidas y los alimentos que se Higher education careers adviser. Comunquese con el nutricionista para recibir ms informacin. Document Released: 06/13/2005 Document Revised: 09/08/2013 [facility] Patient Information 2015 [place]. This information is not intended to replace advice given to you by your health care provider. Make sure you discuss any questions you have with your health care provider.

## 2014-06-17 NOTE — MAU Provider Note (Signed)
History    CSN: [id number]  Arrival date and time: 06/17/14 5631   First Provider Initiated Contact with Patient 06/17/14 1302      Chief Complaint  Patient presents with  . Abdominal Pain   HPI Lorraine [name] is a 28 y.o. [id number] at [redacted]w[redacted]d who presents with epigastric pain since yesterday morning. Was initially intermittent, progressed to be more constant. Did not try anything to alleviate the pain. Nothing noticed that made it better or worse.No chest pain. Some SOB. No fevers or chills. Some nausea no vomiting. No diarrhea.  Reports good fetal movements. No contractions. No loss of fluid or vaginal bleeding.  OB History   Grav Para Term Preterm Abortions TAB SAB Ect Mult Living   5 2 1 1 2  2   2       Past Medical History  Diagnosis Date  . Diverticulitis     Past Surgical History  Procedure Laterality Date  . Cholecystectomy    . Cesarean section      Family History  Problem Relation Age of Onset  . Hyperlipidemia Father   . Diabetes Father     History  Substance Use Topics  . Smoking status: Never Smoker   . Smokeless tobacco: Never Used  . Alcohol Use: No    Allergies: No Known Allergies  Prescriptions prior to admission  Medication Sig Dispense Refill  . acetaminophen (TYLENOL) 325 MG tablet Take 325 mg by mouth every 6 (six) hours as needed for moderate pain.       . Prenatal Vit-Fe Fumarate-FA (MULTIVITAMIN-PRENATAL) 27-0.8 MG TABS tablet Take 1 tablet by mouth daily at 12 noon.        ROS Physical Exam   Blood pressure 117/54, pulse 92, temperature 98.7 F (37.1 C), temperature source Oral, resp. rate 18, height 4\' 11"  (1.499 m), weight 87.091 kg (192 lb), last menstrual period 11/27/2013.  Physical Exam  Nursing note and vitals reviewed. Constitutional: She is oriented to person, place, and time. She appears well-developed and well-nourished.  HENT:  Head: Normocephalic and atraumatic.  Eyes: Pupils are equal, round, and reactive to  light.  Cardiovascular: Normal rate and regular rhythm.   No murmur heard. Respiratory: Breath sounds normal. She has no wheezes.  GI:  Mild tenderness to palpation in epigastric area. Negative Murphy's. No hepatosplenomegaly noted.   Musculoskeletal: She exhibits no edema.  Neurological: She is alert and oriented to person, place, and time.  Skin: Skin is warm and dry.   FHR: 150, reassuring for gestational age.   Results for orders placed during the hospital encounter of 06/17/14 (from the past 24 hour(s))  URINALYSIS, ROUTINE W REFLEX MICROSCOPIC     Status: Abnormal   Collection Time    06/17/14 10:35 AM      Result Value Ref Range   Color, Urine YELLOW  YELLOW   APPearance HAZY (*) CLEAR   Specific Gravity, Urine 1.020  1.005 - 1.030   pH 6.0  5.0 - 8.0   Glucose, UA NEGATIVE  NEGATIVE mg/dL   Hgb urine dipstick NEGATIVE  NEGATIVE   Bilirubin Urine NEGATIVE  NEGATIVE   Ketones, ur NEGATIVE  NEGATIVE mg/dL   Protein, ur NEGATIVE  NEGATIVE mg/dL   Urobilinogen, UA 0.2  0.0 - 1.0 mg/dL   Nitrite NEGATIVE  NEGATIVE   Leukocytes, UA NEGATIVE  NEGATIVE  CBC     Status: Abnormal   Collection Time    06/17/14 12:23 PM      Result Value  Ref Range   WBC 9.6  4.0 - 10.5 K/uL   RBC 3.33 (*) 3.87 - 5.11 MIL/uL   Hemoglobin 10.3 (*) 12.0 - 15.0 g/dL   HCT 30.6 (*) 36.0 - 46.0 %   MCV 91.9  78.0 - 100.0 fL   MCH 30.9  26.0 - 34.0 pg   MCHC 33.7  30.0 - 36.0 g/dL   RDW 12.9  11.5 - 15.5 %   Platelets 247  150 - 400 K/uL  COMPREHENSIVE METABOLIC PANEL     Status: Abnormal   Collection Time    06/17/14 12:23 PM      Result Value Ref Range   Sodium 138  137 - 147 mEq/L   Potassium 3.5 (*) 3.7 - 5.3 mEq/L   Chloride 104  96 - 112 mEq/L   CO2 21  19 - 32 mEq/L   Glucose, Bld 97  70 - 99 mg/dL   BUN 9  6 - 23 mg/dL   Creatinine, Ser 0.56  0.50 - 1.10 mg/dL   Calcium 9.3  8.4 - 10.5 mg/dL   Total Protein 6.9  6.0 - 8.3 g/dL   Albumin 2.9 (*) 3.5 - 5.2 g/dL   AST 18  0 - 37 U/L    ALT 13  0 - 35 U/L   Alkaline Phosphatase 99  39 - 117 U/L   Total Bilirubin <0.2 (*) 0.3 - 1.2 mg/dL   GFR calc non Af Amer >90  >90 mL/min   GFR calc Af Amer >90  >90 mL/min   Anion gap 13  5 - 15    MAU Course  Procedures  Recieved GI cocktail in the MAU which partially alleviated her pain.   Assessment and Plan  Presentation consistent with gastric reflux. Patient is s/p cholecystectomy. CBC, CMP and UA unremarkable. Reassuring fetal tracings. Will prescribe pepcid and discharge home.  Lorraine [name] 06/17/2014, 2:17 PM   I was consulted RE: the POC and agree with above.  [place], CNM 06/17/2014 9:24 PM

## 2014-06-17 NOTE — MAU Note (Signed)
Pain in upper abd, down to lower abd.  Started yesterday morning. Was coming and going, became constant last night.

## 2014-06-22 ENCOUNTER — Encounter (HOSPITAL_COMMUNITY): Payer: Self-pay | Admitting: *Deleted

## 2014-06-22 ENCOUNTER — Inpatient Hospital Stay (HOSPITAL_COMMUNITY)
Admission: AD | Admit: 2014-06-22 | Discharge: 2014-06-22 | Disposition: A | Discharge status: Home / Self Care | Payer: Self-pay | Source: Ambulatory Visit | Attending: Obstetrics & Gynecology | Admitting: Obstetrics & Gynecology

## 2014-06-22 ENCOUNTER — Encounter: Payer: Self-pay | Admitting: Family Medicine

## 2014-06-22 DIAGNOSIS — O26893 Other specified pregnancy related conditions, third trimester: Secondary | ICD-10-CM

## 2014-06-22 DIAGNOSIS — R109 Unspecified abdominal pain: Secondary | ICD-10-CM | POA: Insufficient documentation

## 2014-06-22 DIAGNOSIS — K219 Gastro-esophageal reflux disease without esophagitis: Secondary | ICD-10-CM | POA: Insufficient documentation

## 2014-06-22 DIAGNOSIS — O9989 Other specified diseases and conditions complicating pregnancy, childbirth and the puerperium: Secondary | ICD-10-CM | POA: Insufficient documentation

## 2014-06-22 DIAGNOSIS — S93402A Sprain of unspecified ligament of left ankle, initial encounter: Secondary | ICD-10-CM | POA: Insufficient documentation

## 2014-06-22 DIAGNOSIS — W108XXA Fall (on) (from) other stairs and steps, initial encounter: Secondary | ICD-10-CM

## 2014-06-22 MED ORDER — GI COCKTAIL ~~LOC~~
30.0000 mL | Freq: Once | ORAL | Status: AC
  Administered 2014-06-22: 30 mL via ORAL
  Filled 2014-06-22: qty 30

## 2014-06-22 MED ORDER — ACETAMINOPHEN 325 MG PO TABS
650.0000 mg | ORAL_TABLET | Freq: Once | ORAL | Status: AC
  Administered 2014-06-22: 650 mg via ORAL
  Filled 2014-06-22: qty 2

## 2014-06-22 NOTE — Progress Notes (Signed)
Patient was standing on both feet equally when assisted with changing into gown.

## 2014-06-22 NOTE — MAU Provider Note (Signed)
First Provider Initiated Contact with Patient 06/22/14 1209      Chief Complaint:  Ankle Pain and Abdominal Pain   Lorraine [name] is  28 y.o. [id number] at [redacted]w[redacted]d presents complaining of Ankle Pain and Abdominal Pain   Patient states that she was walking off a step this morning and "twisted" her ankle and fell on her knees. Denies abdominal trauma. Denies loss of consciousness. Reports diffuse abdominal pain since fall. Was unable to bear weight after fall and reports significant pain in left ankle with some swelling. Has not tried anything for pain.  Reports that her abdominal pain is somewhat different than the same pain she presented with last week. Says that she has been taking pepcid at home which as helped some. No nausea, vomiting, constipation, or diarrhea. Last BM yesterday. Last ate yesterday. No fevers or chills.  Contractions: none  Vaginal Bleeding: none LOF: none Fetal Movement: active   Obstetrical/Gynecological History: OB History   Grav Para Term Preterm Abortions TAB SAB Ect Mult Living   5 2 1 1 2  2   2      Past Medical History: Past Medical History  Diagnosis Date  . Diverticulitis     Past Surgical History: Past Surgical History  Procedure Laterality Date  . Cholecystectomy    . Cesarean section      Family History: Family History  Problem Relation Age of Onset  . Hyperlipidemia Father   . Diabetes Father     Social History: History  Substance Use Topics  . Smoking status: Never Smoker   . Smokeless tobacco: Never Used  . Alcohol Use: No    Allergies: No Known Allergies  Meds:  Prescriptions prior to admission  Medication Sig Dispense Refill  . acetaminophen (TYLENOL) 325 MG tablet Take 325 mg by mouth every 6 (six) hours as needed for moderate pain.       . famotidine (PEPCID) 20 MG tablet Take 1 tablet (20 mg total) by mouth 2 (two) times daily.  60 tablet  1  . Prenatal Vit-Fe Fumarate-FA (MULTIVITAMIN-PRENATAL) 27-0.8 MG TABS tablet  Take 1 tablet by mouth daily at 12 noon.        Review of Systems -   Review of Systems as per HPI, otherwise all systems reviewed and are negative.     Physical Exam  Blood pressure 102/56, pulse 74, temperature 98.3 F (36.8 C), temperature source Oral, resp. rate 18, last menstrual period 11/27/2013. GENERAL: Well-developed, well-nourished female in no acute distress.  LUNGS: Clear to auscultation bilaterally.  HEART: Regular rate and rhythm. ABDOMEN: Soft, nontender, nondistended, gravid. Mildly diffusely tender EXTREMITIES: Nontender, no edema, 2+ distal pulses. Left ankle moderately tender to palpation along lateral and medial malleoli. No appreciable edema or erythema. Good range of motion of passive flexion with minimal pain elicited.  DTR's 2+ FHT:  Baseline rate 150 bpm   Variability moderate  Accelerations present   Decelerations none    Labs: No results found for this or any previous visit (from the past 24 hour(s)). Imaging Studies:    Assessment: Lorraine [name] is  28 y.o. [id number] at [redacted]w[redacted]d presents with mild left ankle sprain and mild abdominal pain likely secondary to GER or PUD. Patient's pain improved with GI cocktail and tylenol in the MAU. FHT: Category I  Plan: #Mild Left Ankle Sprain: No imaging indicated. Recommended rest, elevation, ice, and tylenol prn #GER: Continue home pepcid #OB: FHT reassuring, expectant management  Discharge Home   Dimas Chyle 06/22/2014 2:52  PM  OB fellow attestation:  I have seen and examined this patient; I agree with above documentation in the resident's note.   Lorraine [name] is a 28 y.o. [id number] reporting ankle pain and abdominal pain following fall.  Patient rolled left ankle, fell to knees, did not sustain any abdominal trauma.  Seen in MAU 10/1 for midepigastric pain, today's pain is slightly worse than previous pain.  Required wheel chair to be escorted to room, however was able to stand without difficulty with  tech to undress.  +FM, denies LOF, VB, contractions, vaginal discharge.  PE: BP 102/56  Pulse 74  Temp(Src) 98.3 F (36.8 C) (Oral)  Resp 18  LMP 11/27/2013 Gen: calm comfortable, NAD Resp: normal effort, no distress Abd: gravid Left lower extr: left lateral malleolus mildly tender to palpation, no swelling at all, no erythema, normal ROM but is tender with ROM  ROS, labs, PMH reviewed NST reactive  Plan: - fetal kick counts reinforced, preterm labor precautions - continue routine follow up in OB clinic - no signs/symptoms of preterm labor, prolonged monitoring very normal and reassuring.  Continue with pepcid for reflux. - prn ACE wrap for ankle sprain  Merla Riches, MD 5:50 PM

## 2014-06-22 NOTE — Discharge Instructions (Signed)
Dolor abdominal durante el embarazo (Abdominal Pain During Pregnancy) El dolor de vientre (abdominal) es habitual durante el embarazo. Generalmente no se trata de un problema grave. Otras veces puede ser un signo de que algo no anda bien. Siempre comunquese con su mdico si tiene dolor abdominal. CUIDADOS EN EL HOGAR Controle el dolor para ver si hay cambios. Las indicaciones que siguen pueden ayudarla a sentirse mejor:  Optician, dispensing (relaciones sexuales) ni se coloque nada dentro de la vagina hasta que se sienta mejor.  Haga reposo hasta que el dolor se calme.  Si siente ganas de vomitar (nuseas ) beba lquidos claros. No consuma alimentos slidos hasta que se sienta mejor.  Slo tome los medicamentos que le haya indicado su mdico.  Cumpla con las visitas al mdico segn las indicaciones. SOLICITE AYUDA DE INMEDIATO SI:   Tiene un sangrado, pierde lquido o elimina trozos de tejido por la vagina.  Siente ms dolor o clicos.  Comienza a vomitar.  Siente dolor al orinar u observa sangre en la orina.  Tiene fiebre.  No siente que el beb se mueva mucho.  Se siente muy dbil o cree que va a desmayarse.  Tiene dificultad para respirar con o sin dolor en el vientre.  Siente un dolor de cabeza muy intenso y Social research officer, government en el vientre.  Observa que sale un lquido por la vagina y tiene dolor abdominal.  La materia fecal es lquida (diarrea).  El dolor en el viente no desaparece, o empeora, luego de hacer reposo. ASEGRESE DE QUE:   Comprende estas instrucciones.  Controlar su afeccin.  Recibir ayuda de inmediato si no mejora o si empeora. Document Released: 05/16/2011 Document Revised: 05/06/2013 [facility] Patient Information 2015 [place]. This information is not intended to replace advice given to you by your health care provider. Make sure you discuss any questions you have with your health care provider.

## 2014-06-22 NOTE — MAU Note (Signed)
Pt states she twisted her L foot & fell on her knees approx 20 minutes, did not fall on abdomen.  Unable to bear weight, no edema noted in triage.  Having upper & lower abd pain, baby hasn'[t moved in last 30 minutes, denies bleeding.

## 2014-06-29 ENCOUNTER — Ambulatory Visit (INDEPENDENT_AMBULATORY_CARE_PROVIDER_SITE_OTHER): Payer: Self-pay | Admitting: Family Medicine

## 2014-06-29 VITALS — BP 112/61 | HR 92 | Temp 98.1°F | Wt 195.2 lb

## 2014-06-29 DIAGNOSIS — Z23 Encounter for immunization: Secondary | ICD-10-CM

## 2014-06-29 DIAGNOSIS — Z3492 Encounter for supervision of normal pregnancy, unspecified, second trimester: Secondary | ICD-10-CM

## 2014-06-29 DIAGNOSIS — Z3482 Encounter for supervision of other normal pregnancy, second trimester: Secondary | ICD-10-CM

## 2014-06-29 LAB — CBC
HCT: 31.6 % — ABNORMAL LOW (ref 36.0–46.0)
Hemoglobin: 10.7 g/dL — ABNORMAL LOW (ref 12.0–15.0)
MCH: 29.8 pg (ref 26.0–34.0)
MCHC: 33.9 g/dL (ref 30.0–36.0)
MCV: 88 fL (ref 78.0–100.0)
PLATELETS: 291 10*3/uL (ref 150–400)
RBC: 3.59 MIL/uL — ABNORMAL LOW (ref 3.87–5.11)
RDW: 13.1 % (ref 11.5–15.5)
WBC: 7.5 10*3/uL (ref 4.0–10.5)

## 2014-06-29 LAB — POCT URINALYSIS DIP (DEVICE)
Bilirubin Urine: NEGATIVE
GLUCOSE, UA: NEGATIVE mg/dL
Ketones, ur: NEGATIVE mg/dL
Leukocytes, UA: NEGATIVE
Nitrite: NEGATIVE
PH: 7 (ref 5.0–8.0)
PROTEIN: NEGATIVE mg/dL
Specific Gravity, Urine: 1.015 (ref 1.005–1.030)
UROBILINOGEN UA: 0.2 mg/dL (ref 0.0–1.0)

## 2014-06-29 MED ORDER — TETANUS-DIPHTH-ACELL PERTUSSIS 5-2.5-18.5 LF-MCG/0.5 IM SUSP: 0.5000 mL | Freq: Once | INTRAMUSCULAR | Status: DC

## 2014-06-29 NOTE — Patient Instructions (Signed)
Third Trimester of Pregnancy The third trimester is from week 29 through week 42, months 7 through 9. The third trimester is a time when the fetus is growing rapidly. At the end of the ninth month, the fetus is about 20 inches in length and weighs 6-10 pounds.  BODY CHANGES Your body goes through many changes during pregnancy. The changes vary from woman to woman.   Your weight will continue to increase. You can expect to gain 25-35 pounds (11-16 kg) by the end of the pregnancy.  You may begin to get stretch marks on your hips, abdomen, and breasts.  You may urinate more often because the fetus is moving lower into your pelvis and pressing on your bladder.  You may develop or continue to have heartburn as a result of your pregnancy.  You may develop constipation because certain hormones are causing the muscles that push waste through your intestines to slow down.  You may develop hemorrhoids or swollen, bulging veins (varicose veins).  You may have pelvic pain because of the weight gain and pregnancy hormones relaxing your joints between the bones in your pelvis. Backaches may result from overexertion of the muscles supporting your posture.  You may have changes in your hair. These can include thickening of your hair, rapid growth, and changes in texture. Some women also have hair loss during or after pregnancy, or hair that feels dry or thin. Your hair will most likely return to normal after your baby is born.  Your breasts will continue to grow and be tender. A yellow discharge may leak from your breasts called colostrum.  Your belly button may stick out.  You may feel short of breath because of your expanding uterus.  You may notice the fetus "dropping," or moving lower in your abdomen.  You may have a bloody mucus discharge. This usually occurs a few days to a week before labor begins.  Your cervix becomes thin and soft (effaced) near your due date. WHAT TO EXPECT AT YOUR PRENATAL  EXAMS  You will have prenatal exams every 2 weeks until week 36. Then, you will have weekly prenatal exams. During a routine prenatal visit:  You will be weighed to make sure you and the fetus are growing normally.  Your blood pressure is taken.  Your abdomen will be measured to track your baby's growth.  The fetal heartbeat will be listened to.  Any test results from the previous visit will be discussed.  You may have a cervical check near your due date to see if you have effaced. At around 36 weeks, your caregiver will check your cervix. At the same time, your caregiver will also perform a test on the secretions of the vaginal tissue. This test is to determine if a type of bacteria, Group B streptococcus, is present. Your caregiver will explain this further. Your caregiver may ask you:  What your birth plan is.  How you are feeling.  If you are feeling the baby move.  If you have had any abnormal symptoms, such as leaking fluid, bleeding, severe headaches, or abdominal cramping.  If you have any questions. Other tests or screenings that may be performed during your third trimester include:  Blood tests that check for low iron levels (anemia).  Fetal testing to check the health, activity level, and growth of the fetus. Testing is done if you have certain medical conditions or if there are problems during the pregnancy. FALSE LABOR You may feel small, irregular contractions that   eventually go away. These are called Braxton Hicks contractions, or false labor. Contractions may last for hours, days, or even weeks before true labor sets in. If contractions come at regular intervals, intensify, or become painful, it is best to be seen by your caregiver.  SIGNS OF LABOR   Menstrual-like cramps.  Contractions that are 5 minutes apart or less.  Contractions that start on the top of the uterus and spread down to the lower abdomen and back.  A sense of increased pelvic pressure or back  pain.  A watery or bloody mucus discharge that comes from the vagina. If you have any of these signs before the 37th week of pregnancy, call your caregiver right away. You need to go to the hospital to get checked immediately. HOME CARE INSTRUCTIONS   Avoid all smoking, herbs, alcohol, and unprescribed drugs. These chemicals affect the formation and growth of the baby.  Follow your caregiver's instructions regarding medicine use. There are medicines that are either safe or unsafe to take during pregnancy.  Exercise only as directed by your caregiver. Experiencing uterine cramps is a good sign to stop exercising.  Continue to eat regular, healthy meals.  Wear a good support bra for breast tenderness.  Do not use hot tubs, steam rooms, or saunas.  Wear your seat belt at all times when driving.  Avoid raw meat, uncooked cheese, cat litter boxes, and soil used by cats. These carry germs that can cause birth defects in the baby.  Take your prenatal vitamins.  Try taking a stool softener (if your caregiver approves) if you develop constipation. Eat more high-fiber foods, such as fresh vegetables or fruit and whole grains. Drink plenty of fluids to keep your urine clear or pale yellow.  Take warm sitz baths to soothe any pain or discomfort caused by hemorrhoids. Use hemorrhoid cream if your caregiver approves.  If you develop varicose veins, wear support hose. Elevate your feet for 15 minutes, 3-4 times a day. Limit salt in your diet.  Avoid heavy lifting, wear low heal shoes, and practice good posture.  Rest a lot with your legs elevated if you have leg cramps or low back pain.  Visit your dentist if you have not gone during your pregnancy. Use a soft toothbrush to brush your teeth and be gentle when you floss.  A sexual relationship may be continued unless your caregiver directs you otherwise.  Do not travel far distances unless it is absolutely necessary and only with the approval  of your caregiver.  Take prenatal classes to understand, practice, and ask questions about the labor and delivery.  Make a trial run to the hospital.  Pack your hospital bag.  Prepare the baby's nursery.  Continue to go to all your prenatal visits as directed by your caregiver. SEEK MEDICAL CARE IF:  You are unsure if you are in labor or if your water has broken.  You have dizziness.  You have mild pelvic cramps, pelvic pressure, or nagging pain in your abdominal area.  You have persistent nausea, vomiting, or diarrhea.  You have a bad smelling vaginal discharge.  You have pain with urination. SEEK IMMEDIATE MEDICAL CARE IF:   You have a fever.  You are leaking fluid from your vagina.  You have spotting or bleeding from your vagina.  You have severe abdominal cramping or pain.  You have rapid weight loss or gain.  You have shortness of breath with chest pain.  You notice sudden or extreme swelling   of your face, hands, ankles, feet, or legs.  You have not felt your baby move in over an hour.  You have severe headaches that do not go away with medicine.  You have vision changes. Document Released: 08/28/2001 Document Revised: 09/08/2013 Document Reviewed: 11/04/2012 [employer] Patient Information 2015 [employer]. This information is not intended to replace advice given to you by your health care provider. Make sure you discuss any questions you have with your health care provider.  

## 2014-06-29 NOTE — Progress Notes (Signed)
Rudy interpreter ID # [id number], 28 wk labs,  TDap, declined flu vaccine

## 2014-06-29 NOTE — Progress Notes (Signed)
Occasional ctx.  Good fetal activity.  Mild lower right rib pain, worse with movement.  28 week labs today.  Denies leaking fluid, bleeding, spotting.

## 2014-06-30 ENCOUNTER — Encounter: Payer: Self-pay | Admitting: Family Medicine

## 2014-06-30 LAB — HIV ANTIBODY (ROUTINE TESTING W REFLEX): HIV 1&2 Ab, 4th Generation: NONREACTIVE

## 2014-06-30 LAB — RPR

## 2014-06-30 LAB — GLUCOSE TOLERANCE, 1 HOUR (50G) W/O FASTING: Glucose, 1 Hour GTT: 125 mg/dL (ref 70–140)

## 2014-07-07 ENCOUNTER — Encounter: Payer: Self-pay | Admitting: Advanced Practice Midwife

## 2014-07-09 ENCOUNTER — Inpatient Hospital Stay (HOSPITAL_COMMUNITY): Payer: Self-pay

## 2014-07-09 ENCOUNTER — Inpatient Hospital Stay (HOSPITAL_COMMUNITY)
Admission: AD | Admit: 2014-07-09 | Discharge: 2014-07-09 | Disposition: A | Discharge status: Home / Self Care | Payer: Self-pay | Source: Ambulatory Visit | Attending: Obstetrics & Gynecology | Admitting: Obstetrics & Gynecology

## 2014-07-09 ENCOUNTER — Encounter (HOSPITAL_COMMUNITY): Payer: Self-pay | Admitting: General Practice

## 2014-07-09 DIAGNOSIS — Z3A28 28 weeks gestation of pregnancy: Secondary | ICD-10-CM | POA: Insufficient documentation

## 2014-07-09 DIAGNOSIS — R110 Nausea: Secondary | ICD-10-CM | POA: Insufficient documentation

## 2014-07-09 DIAGNOSIS — O26892 Other specified pregnancy related conditions, second trimester: Secondary | ICD-10-CM | POA: Insufficient documentation

## 2014-07-09 DIAGNOSIS — R109 Unspecified abdominal pain: Secondary | ICD-10-CM | POA: Insufficient documentation

## 2014-07-09 LAB — COMPREHENSIVE METABOLIC PANEL
ALBUMIN: 2.7 g/dL — AB (ref 3.5–5.2)
ALK PHOS: 123 U/L — AB (ref 39–117)
ALT: 11 U/L (ref 0–35)
AST: 20 U/L (ref 0–37)
Anion gap: 14 (ref 5–15)
BUN: 8 mg/dL (ref 6–23)
CO2: 21 mEq/L (ref 19–32)
Calcium: 8.8 mg/dL (ref 8.4–10.5)
Chloride: 105 mEq/L (ref 96–112)
Creatinine, Ser: 0.56 mg/dL (ref 0.50–1.10)
GFR calc Af Amer: >90 mL/min (ref >90)
GFR calc non Af Amer: >90 mL/min (ref >90)
Glucose, Bld: 94 mg/dL (ref 70–99)
POTASSIUM: 3.9 meq/L (ref 3.7–5.3)
SODIUM: 140 meq/L (ref 137–147)
Total Bilirubin: <0.2 mg/dL — ABNORMAL LOW (ref 0.3–1.2)
Total Protein: 6.7 g/dL (ref 6.0–8.3)

## 2014-07-09 LAB — CBC WITH DIFFERENTIAL/PLATELET
BASOS PCT: 0 % (ref 0–1)
Basophils Absolute: 0 10*3/uL (ref 0.0–0.1)
EOS ABS: 0.1 10*3/uL (ref 0.0–0.7)
Eosinophils Relative: 1 % (ref 0–5)
HCT: 32.3 % — ABNORMAL LOW (ref 36.0–46.0)
Hemoglobin: 10.6 g/dL — ABNORMAL LOW (ref 12.0–15.0)
Lymphocytes Relative: 23 % (ref 12–46)
Lymphs Abs: 1.9 10*3/uL (ref 0.7–4.0)
MCH: 30.2 pg (ref 26.0–34.0)
MCHC: 32.8 g/dL (ref 30.0–36.0)
MCV: 92 fL (ref 78.0–100.0)
Monocytes Absolute: 0.4 10*3/uL (ref 0.1–1.0)
Monocytes Relative: 5 % (ref 3–12)
NEUTROS PCT: 72 % (ref 43–77)
Neutro Abs: 6.1 10*3/uL (ref 1.7–7.7)
PLATELETS: 236 10*3/uL (ref 150–400)
RBC: 3.51 MIL/uL — ABNORMAL LOW (ref 3.87–5.11)
RDW: 12.7 % (ref 11.5–15.5)
WBC: 8.5 10*3/uL (ref 4.0–10.5)

## 2014-07-09 LAB — FETAL FIBRONECTIN: Fetal Fibronectin: NEGATIVE

## 2014-07-09 LAB — URINALYSIS, ROUTINE W REFLEX MICROSCOPIC
Bilirubin Urine: NEGATIVE
GLUCOSE, UA: NEGATIVE mg/dL
HGB URINE DIPSTICK: NEGATIVE
Ketones, ur: 15 mg/dL — AB
Leukocytes, UA: NEGATIVE
Nitrite: NEGATIVE
Protein, ur: NEGATIVE mg/dL
SPECIFIC GRAVITY, URINE: 1.02 (ref 1.005–1.030)
Urobilinogen, UA: 0.2 mg/dL (ref 0.0–1.0)
pH: 6 (ref 5.0–8.0)

## 2014-07-09 MED ORDER — LACTATED RINGERS IV BOLUS (SEPSIS)
500.0000 mL | Freq: Once | INTRAVENOUS | Status: AC
  Administered 2014-07-09: 11:00:00 via INTRAVENOUS

## 2014-07-09 MED ORDER — PROMETHAZINE HCL 25 MG/ML IJ SOLN
12.5000 mg | Freq: Four times a day (QID) | INTRAMUSCULAR | Status: DC | PRN
  Administered 2014-07-09: 12.5 mg via INTRAVENOUS
  Filled 2014-07-09: qty 1

## 2014-07-09 MED ORDER — GI COCKTAIL ~~LOC~~
30.0000 mL | Freq: Once | ORAL | Status: AC
  Administered 2014-07-09: 30 mL via ORAL
  Filled 2014-07-09: qty 30

## 2014-07-09 MED ORDER — HYDROMORPHONE HCL 1 MG/ML IJ SOLN
1.0000 mg | Freq: Once | INTRAMUSCULAR | Status: AC
  Administered 2014-07-09: 1 mg via INTRAVENOUS
  Filled 2014-07-09: qty 1

## 2014-07-09 MED ORDER — ONDANSETRON HCL 4 MG PO TABS: 4.0000 mg | ORAL_TABLET | Freq: Three times a day (TID) | ORAL | Status: DC | PRN

## 2014-07-09 NOTE — Discharge Instructions (Signed)
Dolor abdominal durante el embarazo (Abdominal Pain During Pregnancy) El dolor de vientre (abdominal) es habitual durante el embarazo. Generalmente no se trata de un problema grave. Otras veces puede ser un signo de que algo no anda bien. Siempre comunquese con su mdico si tiene dolor abdominal. CUIDADOS EN EL HOGAR Controle el dolor para ver si hay cambios. Las indicaciones que siguen pueden ayudarla a sentirse mejor:  Optician, dispensing (relaciones sexuales) ni se coloque nada dentro de la vagina hasta que se sienta mejor.  Haga reposo hasta que el dolor se calme.  Si siente ganas de vomitar (nuseas ) beba lquidos claros. No consuma alimentos slidos hasta que se sienta mejor.  Slo tome los medicamentos que le haya indicado su mdico.  Cumpla con las visitas al mdico segn las indicaciones. SOLICITE AYUDA DE INMEDIATO SI:   Tiene un sangrado, pierde lquido o elimina trozos de tejido por la vagina.  Siente ms dolor o clicos.  Comienza a vomitar.  Siente dolor al orinar u observa sangre en la orina.  Tiene fiebre.  No siente que el beb se mueva mucho.  Se siente muy dbil o cree que va a desmayarse.  Tiene dificultad para respirar con o sin dolor en el vientre.  Siente un dolor de cabeza muy intenso y Social research officer, government en el vientre.  Observa que sale un lquido por la vagina y tiene dolor abdominal.  La materia fecal es lquida (diarrea).  El dolor en el viente no desaparece, o empeora, luego de hacer reposo. ASEGRESE DE QUE:   Comprende estas instrucciones.  Controlar su afeccin.  Recibir ayuda de inmediato si no mejora o si empeora. Document Released: 05/16/2011 Document Revised: 05/06/2013 [facility] Patient Information 2015 [place]. This information is not intended to replace advice given to you by your health care provider. Make sure you discuss any questions you have with your health care provider.

## 2014-07-09 NOTE — MAU Note (Signed)
Pt presents to MAU for c/o upper abdominal pain on the right side. Pt states the pain began on Wednesday night. Patient states the pain the was coming and going on Wednesday night and Thursday but today the pain has become worse. Pt states the pain is coming and going but at its strongest its a 6 on the pain scale. Pt states the strong pain comes about every ten minutes.

## 2014-07-09 NOTE — MAU Provider Note (Signed)
Chief Complaint:  Abdominal Pain and Emesis   Lorraine [name] is a 28 y.o.  [id number] with IUP at [redacted]w[redacted]d presenting for Abdominal Pain and Emesis . Patient states she has been having  none contractions, none vaginal bleeding, intact membranes, with active fetal movement.   She states that for the past 2 days she has had increased right sided abdominal pain accompanied by nausea and vomiting since yesterday.  She denies fever, dysuria, abnormal bowel movements.Had sexual relations on Tuesday w/o any difficulties.  Menstrual History: OB History   Grav Para Term Preterm Abortions TAB SAB Ect Mult Living   5 2 1 1 2  2   2        Patient's last menstrual period was 11/27/2013.      Past Medical History  Diagnosis Date  . Diverticulitis     Past Surgical History  Procedure Laterality Date  . Cholecystectomy    . Cesarean section      Family History  Problem Relation Age of Onset  . Hyperlipidemia Father   . Diabetes Father     History  Substance Use Topics  . Smoking status: Never Smoker   . Smokeless tobacco: Never Used  . Alcohol Use: No     No Known Allergies  Facility-administered medications prior to admission  Medication Dose Route Frequency Provider Last Rate Last Dose  . Tdap (BOOSTRIX) injection 0.5 mL  0.5 mL Intramuscular Once Truett Mainland, DO       Prescriptions prior to admission  Medication Sig Dispense Refill  . acetaminophen (TYLENOL) 325 MG tablet Take 325 mg by mouth every 6 (six) hours as needed for moderate pain.       . famotidine (PEPCID) 20 MG tablet Take 1 tablet (20 mg total) by mouth 2 (two) times daily.  60 tablet  1  . Prenatal Vit-Fe Fumarate-FA (MULTIVITAMIN-PRENATAL) 27-0.8 MG TABS tablet Take 1 tablet by mouth daily at 12 noon.        Review of Systems - Negative except for what is mentioned in HPI.  Physical Exam  Blood pressure 125/56, pulse 87, temperature 98 F (36.7 C), temperature source Oral, resp. rate 20, last menstrual  period 11/27/2013. GENERAL: Well-developed, well-nourished female in no acute distress.  LUNGS: Clear to auscultation bilaterally.  HEART: Regular rate and rhythm. ABDOMEN: Soft, nontender, nondistended, gravid. No rebound, guarding. GU: Slight CVA tenderness on R side.  EXTREMITIES: Nontender, no edema, 2+ distal pulses. Dilation: Closed Effacement (%): Thick Station: -3 Exam by:: Dr. Derrill Kay   FHT:  Baseline rate 140 bpm   Variability moderate  Accelerations present   Decelerations none Contractions: None  Labs: Results for orders placed during the hospital encounter of 07/09/14 (from the past 24 hour(s))  CBC WITH DIFFERENTIAL   Collection Time    07/09/14 10:32 AM      Result Value Ref Range   WBC 8.5  4.0 - 10.5 K/uL   RBC 3.51 (*) 3.87 - 5.11 MIL/uL   Hemoglobin 10.6 (*) 12.0 - 15.0 g/dL   HCT 32.3 (*) 36.0 - 46.0 %   MCV 92.0  78.0 - 100.0 fL   MCH 30.2  26.0 - 34.0 pg   MCHC 32.8  30.0 - 36.0 g/dL   RDW 12.7  11.5 - 15.5 %   Platelets 236  150 - 400 K/uL   Neutrophils Relative % 72  43 - 77 %   Neutro Abs 6.1  1.7 - 7.7 K/uL   Lymphocytes Relative 23  12 - 46 %   Lymphs Abs 1.9  0.7 - 4.0 K/uL   Monocytes Relative 5  3 - 12 %   Monocytes Absolute 0.4  0.1 - 1.0 K/uL   Eosinophils Relative 1  0 - 5 %   Eosinophils Absolute 0.1  0.0 - 0.7 K/uL   Basophils Relative 0  0 - 1 %   Basophils Absolute 0.0  0.0 - 0.1 K/uL  COMPREHENSIVE METABOLIC PANEL   Collection Time    07/09/14 10:32 AM      Result Value Ref Range   Sodium 140  137 - 147 mEq/L   Potassium 3.9  3.7 - 5.3 mEq/L   Chloride 105  96 - 112 mEq/L   CO2 21  19 - 32 mEq/L   Glucose, Bld 94  70 - 99 mg/dL   BUN 8  6 - 23 mg/dL   Creatinine, Ser 0.56  0.50 - 1.10 mg/dL   Calcium 8.8  8.4 - 10.5 mg/dL   Total Protein 6.7  6.0 - 8.3 g/dL   Albumin 2.7 (*) 3.5 - 5.2 g/dL   AST 20  0 - 37 U/L   ALT 11  0 - 35 U/L   Alkaline Phosphatase 123 (*) 39 - 117 U/L   Total Bilirubin <0.2 (*) 0.3 - 1.2 mg/dL   GFR  calc non Af Amer >90  >90 mL/min   GFR calc Af Amer >90  >90 mL/min   Anion gap 14  5 - 15  URINALYSIS, ROUTINE W REFLEX MICROSCOPIC   Collection Time    07/09/14 11:30 AM      Result Value Ref Range   Color, Urine YELLOW  YELLOW   APPearance CLEAR  CLEAR   Specific Gravity, Urine 1.020  1.005 - 1.030   pH 6.0  5.0 - 8.0   Glucose, UA NEGATIVE  NEGATIVE mg/dL   Hgb urine dipstick NEGATIVE  NEGATIVE   Bilirubin Urine NEGATIVE  NEGATIVE   Ketones, ur 15 (*) NEGATIVE mg/dL   Protein, ur NEGATIVE  NEGATIVE mg/dL   Urobilinogen, UA 0.2  0.0 - 1.0 mg/dL   Nitrite NEGATIVE  NEGATIVE   Leukocytes, UA NEGATIVE  NEGATIVE  FETAL FIBRONECTIN   Collection Time    07/09/14 12:14 PM      Result Value Ref Range   Fetal Fibronectin NEGATIVE  NEGATIVE    Imaging Studies:  US Renal  07/09/2014   CLINICAL DATA:  Right upper quadrant and flank pain since July 07, 2014.  EXAM: RENAL/URINARY TRACT ULTRASOUND COMPLETE  COMPARISON:  None.  FINDINGS: Right Kidney:  Length: 10.7 cm. Echogenicity within normal limits. No mass visualized. There is prominent right renal pelvis.  Left Kidney:  Length: 11.5 cm. Echogenicity within normal limits. No mass or hydronephrosis visualized.  Bladder:  Appears normal for degree of bladder distention. A left ureteral jet is identified. No right ureteral jet visualized.  IMPRESSION: Prominent right renal pelvis, this is nonspecific. No dilated right ureter identified. There is no right ureteral jets noted in the bladder. Consider further evaluation with CT urogram to assess right collecting system if clinically indicated.   Electronically Signed   By: Abelardo Diesel M.D.   On: 07/09/2014 13:49    Assessment: Lorraine [name] is  28 y.o. [id number] at [redacted]w[redacted]d presents with abdominal pain and nausea.  Plan:  #Abdominal pain/Nausea --Pt had cholecystectomy after prior pregnancy --Labs wnl --Renal u/s w lack of r ureteral jet. No ureteral dilation. R renal dilation most  likely due to normal pregnancy change. Cannot r/o calculus. However clinical suspicion low given negative labs, atypical clinical presentation, and U/S results. If pt returns w intensified pain, consider low dose CT Urogram to r/o calculus.  --Pt has hx of diverticulitis however currently afebrile, normal BMs, and normals labs decrease suspicion for flare up. --Given location of pain appendicitis cannot be r/o, however given normal labs, afebrile, and negative LRQ exam suspicion is low.  --[place] negative for abcess. Do not suspect pyelonephritis given lack of fever, dysuria, and completely normal UA. --After IV hydration and zofran/dilaudid, pt reported feeling better.  --Most likely viral gastroenteritis --Advised to continue to hydrate at home w gatorade, pedialyte, etc., and to return if pain/vomiting worsen.  #Gestation of [redacted]w[redacted]d --Continue w scheduled prenatal care  #FWB --NST reactive Collene Gobble 10/23/20153:10 PM  Seen also by me DIscussed at length with Dr Roselie Awkward.   Normal labs and [place] reviewed. This may represent gastroenteritis which may be aggravating her diverticulosis.  Precautions reviewed.  Has appt coming up with clinic. So will followup then.   AGree with note

## 2014-07-09 NOTE — MAU Note (Signed)
Pain in upper abd and back.  Has been vomiting.  Denies bleeding or leaking.

## 2014-07-10 LAB — URINE CULTURE: SPECIAL REQUESTS: NORMAL

## 2014-07-12 ENCOUNTER — Emergency Department (HOSPITAL_COMMUNITY): Payer: Self-pay

## 2014-07-12 ENCOUNTER — Encounter (HOSPITAL_COMMUNITY): Payer: Self-pay | Admitting: Emergency Medicine

## 2014-07-12 ENCOUNTER — Inpatient Hospital Stay (HOSPITAL_COMMUNITY)
Admission: EM | Admit: 2014-07-12 | Discharge: 2014-07-15 | DRG: 781 | Disposition: A | Discharge status: Home / Self Care | Payer: Self-pay | Attending: Obstetrics and Gynecology | Admitting: Obstetrics and Gynecology

## 2014-07-12 DIAGNOSIS — N949 Unspecified condition associated with female genital organs and menstrual cycle: Secondary | ICD-10-CM

## 2014-07-12 DIAGNOSIS — R109 Unspecified abdominal pain: Secondary | ICD-10-CM

## 2014-07-12 DIAGNOSIS — O26899 Other specified pregnancy related conditions, unspecified trimester: Secondary | ICD-10-CM

## 2014-07-12 DIAGNOSIS — R52 Pain, unspecified: Secondary | ICD-10-CM

## 2014-07-12 DIAGNOSIS — Z833 Family history of diabetes mellitus: Secondary | ICD-10-CM

## 2014-07-12 DIAGNOSIS — O9989 Other specified diseases and conditions complicating pregnancy, childbirth and the puerperium: Principal | ICD-10-CM | POA: Diagnosis present

## 2014-07-12 DIAGNOSIS — O09892 Supervision of other high risk pregnancies, second trimester: Secondary | ICD-10-CM

## 2014-07-12 DIAGNOSIS — N10 Acute tubulo-interstitial nephritis: Secondary | ICD-10-CM

## 2014-07-12 DIAGNOSIS — N12 Tubulo-interstitial nephritis, not specified as acute or chronic: Secondary | ICD-10-CM | POA: Diagnosis present

## 2014-07-12 DIAGNOSIS — O34219 Maternal care for unspecified type scar from previous cesarean delivery: Secondary | ICD-10-CM

## 2014-07-12 DIAGNOSIS — R1031 Right lower quadrant pain: Secondary | ICD-10-CM

## 2014-07-12 DIAGNOSIS — Z3482 Encounter for supervision of other normal pregnancy, second trimester: Secondary | ICD-10-CM

## 2014-07-12 DIAGNOSIS — O09212 Supervision of pregnancy with history of pre-term labor, second trimester: Secondary | ICD-10-CM

## 2014-07-12 DIAGNOSIS — O3421 Maternal care for scar from previous cesarean delivery: Secondary | ICD-10-CM

## 2014-07-12 DIAGNOSIS — Z3A29 29 weeks gestation of pregnancy: Secondary | ICD-10-CM

## 2014-07-12 LAB — URINALYSIS, ROUTINE W REFLEX MICROSCOPIC
Bilirubin Urine: NEGATIVE
Glucose, UA: NEGATIVE mg/dL
Hgb urine dipstick: NEGATIVE
Ketones, ur: NEGATIVE mg/dL
Leukocytes, UA: NEGATIVE
Nitrite: NEGATIVE
Protein, ur: NEGATIVE mg/dL
Specific Gravity, Urine: 1.016 (ref 1.005–1.030)
Urobilinogen, UA: 0.2 mg/dL (ref 0.0–1.0)
pH: 6.5 (ref 5.0–8.0)

## 2014-07-12 LAB — COMPREHENSIVE METABOLIC PANEL WITH GFR
ALT: 10 U/L (ref 0–35)
AST: 17 U/L (ref 0–37)
Albumin: 2.7 g/dL — ABNORMAL LOW (ref 3.5–5.2)
Alkaline Phosphatase: 121 U/L — ABNORMAL HIGH (ref 39–117)
Anion gap: 15 (ref 5–15)
BUN: 9 mg/dL (ref 6–23)
CO2: 19 meq/L (ref 19–32)
Calcium: 9 mg/dL (ref 8.4–10.5)
Chloride: 103 meq/L (ref 96–112)
Creatinine, Ser: 0.58 mg/dL (ref 0.50–1.10)
GFR calc Af Amer: >90 mL/min
GFR calc non Af Amer: >90 mL/min
Glucose, Bld: 94 mg/dL (ref 70–99)
Potassium: 4 meq/L (ref 3.7–5.3)
Sodium: 137 meq/L (ref 137–147)
Total Bilirubin: <0.2 mg/dL — ABNORMAL LOW (ref 0.3–1.2)
Total Protein: 6.9 g/dL (ref 6.0–8.3)

## 2014-07-12 LAB — CBC WITH DIFFERENTIAL/PLATELET
Basophils Absolute: 0 10*3/uL (ref 0.0–0.1)
Basophils Relative: 0 % (ref 0–1)
Eosinophils Absolute: 0.1 10*3/uL (ref 0.0–0.7)
Eosinophils Relative: 1 % (ref 0–5)
HCT: 32.9 % — ABNORMAL LOW (ref 36.0–46.0)
Hemoglobin: 10.9 g/dL — ABNORMAL LOW (ref 12.0–15.0)
Lymphocytes Relative: 25 % (ref 12–46)
Lymphs Abs: 2.3 10*3/uL (ref 0.7–4.0)
MCH: 30.7 pg (ref 26.0–34.0)
MCHC: 33.1 g/dL (ref 30.0–36.0)
MCV: 92.7 fL (ref 78.0–100.0)
Monocytes Absolute: 0.4 10*3/uL (ref 0.1–1.0)
Monocytes Relative: 5 % (ref 3–12)
Neutro Abs: 6.3 10*3/uL (ref 1.7–7.7)
Neutrophils Relative %: 69 % (ref 43–77)
Platelets: 238 10*3/uL (ref 150–400)
RBC: 3.55 MIL/uL — ABNORMAL LOW (ref 3.87–5.11)
RDW: 12.6 % (ref 11.5–15.5)
WBC: 9.1 10*3/uL (ref 4.0–10.5)

## 2014-07-12 LAB — WET PREP, GENITAL: Clue Cells Wet Prep HPF POC: NONE SEEN

## 2014-07-12 LAB — HIV ANTIBODY (ROUTINE TESTING W REFLEX): HIV 1&2 Ab, 4th Generation: NONREACTIVE

## 2014-07-12 MED ORDER — MORPHINE SULFATE 4 MG/ML IJ SOLN
4.0000 mg | INTRAMUSCULAR | Status: DC | PRN
  Administered 2014-07-12 – 2014-07-15 (×10): 4 mg via INTRAVENOUS
  Filled 2014-07-12 (×10): qty 1

## 2014-07-12 MED ORDER — PRENATAL 27-0.8 MG PO TABS: 1.0000 | ORAL_TABLET | Freq: Every day | ORAL | Status: DC

## 2014-07-12 MED ORDER — MORPHINE SULFATE 4 MG/ML IJ SOLN
4.0000 mg | Freq: Once | INTRAMUSCULAR | Status: AC
  Administered 2014-07-12: 4 mg via INTRAVENOUS
  Filled 2014-07-12: qty 1

## 2014-07-12 MED ORDER — LACTATED RINGERS IV SOLN
INTRAVENOUS | Status: DC
  Administered 2014-07-12: 11:00:00 via INTRAVENOUS

## 2014-07-12 MED ORDER — ACETAMINOPHEN 325 MG PO TABS: 325.0000 mg | ORAL_TABLET | Freq: Four times a day (QID) | ORAL | Status: DC | PRN

## 2014-07-12 MED ORDER — FAMOTIDINE 20 MG PO TABS
20.0000 mg | ORAL_TABLET | Freq: Two times a day (BID) | ORAL | Status: DC
  Administered 2014-07-12 – 2014-07-15 (×6): 20 mg via ORAL
  Filled 2014-07-12 (×6): qty 1

## 2014-07-12 MED ORDER — ONDANSETRON HCL 4 MG/2ML IJ SOLN
4.0000 mg | Freq: Three times a day (TID) | INTRAMUSCULAR | Status: DC | PRN
  Administered 2014-07-12 – 2014-07-15 (×5): 4 mg via INTRAVENOUS
  Filled 2014-07-12 (×6): qty 2

## 2014-07-12 MED ORDER — METOCLOPRAMIDE HCL 5 MG/ML IJ SOLN
10.0000 mg | Freq: Once | INTRAMUSCULAR | Status: AC
  Administered 2014-07-12: 10 mg via INTRAVENOUS
  Filled 2014-07-12: qty 2

## 2014-07-12 MED ORDER — ACETAMINOPHEN 325 MG PO TABS
650.0000 mg | ORAL_TABLET | ORAL | Status: DC | PRN
  Administered 2014-07-14: 650 mg via ORAL
  Filled 2014-07-12: qty 2

## 2014-07-12 MED ORDER — CALCIUM CARBONATE ANTACID 500 MG PO CHEW: 2.0000 | CHEWABLE_TABLET | ORAL | Status: DC | PRN

## 2014-07-12 MED ORDER — DEXTROSE 5 % IV SOLN
1.0000 g | Freq: Two times a day (BID) | INTRAVENOUS | Status: DC
  Administered 2014-07-12 – 2014-07-15 (×7): 1 g via INTRAVENOUS
  Filled 2014-07-12 (×8): qty 10

## 2014-07-12 MED ORDER — LACTATED RINGERS IV BOLUS (SEPSIS)
500.0000 mL | Freq: Once | INTRAVENOUS | Status: AC
  Administered 2014-07-12: 500 mL via INTRAVENOUS

## 2014-07-12 MED ORDER — PRENATAL MULTIVITAMIN CH
1.0000 | ORAL_TABLET | Freq: Every day | ORAL | Status: DC
  Administered 2014-07-14: 1 via ORAL
  Filled 2014-07-12: qty 1

## 2014-07-12 MED ORDER — DOCUSATE SODIUM 100 MG PO CAPS
100.0000 mg | ORAL_CAPSULE | Freq: Every day | ORAL | Status: DC
  Administered 2014-07-14: 100 mg via ORAL
  Filled 2014-07-12: qty 1

## 2014-07-12 MED ORDER — SODIUM CHLORIDE 0.9 % IV SOLN
INTRAVENOUS | Status: DC
  Administered 2014-07-12 – 2014-07-15 (×8): via INTRAVENOUS

## 2014-07-12 MED ORDER — ZOLPIDEM TARTRATE 5 MG PO TABS: 5.0000 mg | ORAL_TABLET | Freq: Every evening | ORAL | Status: DC | PRN

## 2014-07-12 NOTE — Progress Notes (Signed)
Report given to Sherlyn Lick,  Charge nurse [facility], MAU. Waiting for [facility].

## 2014-07-12 NOTE — ED Notes (Signed)
Rapid response and MD at bedside.

## 2014-07-12 NOTE — ED Notes (Addendum)
Pt states she was just discharged from the hospital on Sat after being tx for an abdominal infection.  States she has been taking the pepcid they precribed but continue to have RLQ pain.  Hx of diverticulitis per chart.Translator Louis used on Medical illustrator.

## 2014-07-12 NOTE — Progress Notes (Signed)
Dr. Burman Blacksmith at bedside. Says he plans to do an MRI to r/o appendicitis. Says he plans to speak with the attending OB.

## 2014-07-12 NOTE — Progress Notes (Signed)
Spoke with Dr. Elly Modena. Pt is a G5 with 2 living children, previous C/S, previous cholecystectomy, and hx of diverticulitis. Pt denies vag bleeding, leaking of fluid. FHR is 145 BPM, mod variability, accels, no decels. Pt is having uc's every 4-6 min and I have received an order for LR, 580ml bolus and then 177ml/hr from the ED MD. Pt has had labs done here and an in and out cath was sent. Pt was seen on 07/09/14 at [facility], MAU for c/o rt sided pain, N&V. CBC with Diff, CMP, urinalysis was done and her labs were NL. Pt was afebrile then and she is afebrile now. Pt is going to have an MRI to rule out appedicitis and then she can go to [facility] to R/O preterm labor.

## 2014-07-12 NOTE — Progress Notes (Signed)
Dr. Burman Blacksmith in. MRI neg for appendicitis. States he is going to speak with Dr. Elly Modena. I told him that I am not seeiing any uc's and the FHR is reactive. Category 1 tracing.

## 2014-07-12 NOTE — MAU Note (Signed)
Pt presents to mau via stretcher by [facility]. Report that patient had been at [facility] for hours being evaluated for abdominal pain. Pt was seen on this unit several days prior for the same pain on her right side but today the pain is somewhat lower and extends into the right lower quadrant.

## 2014-07-12 NOTE — ED Provider Notes (Signed)
CSN: [id number]     Arrival date & time 07/12/14  9735 History   First MD Initiated Contact with Patient 07/12/14 0720     Chief Complaint  Patient presents with  . Abdominal Pain   History is obtained from medical interpreter using [place] language line patient speaks no [place]  (Consider location/radiation/quality/duration/timing/severity/associated sxs/prior Treatment) Patient is a 28 y.o. female presenting with abdominal pain.  Abdominal Pain  Complains of right lower quadrant pain nonradiating onset 5 days ago, became worse 2 days ago. Patient seen at [facility] 2 days ago treated with intravenous Zofran and Dilaudid, released. Since release pain became worse. It is nonradiating. Last bowel movement this morning, normal. She denies urinary symptoms no vaginal discharge no vaginal bleeding. Pain is not made better or worse by anything. She has been treating herself with Tylenol and Pepcid, with minimal relief. Appetite is diminished. No nausea vomiting or diarrhea. No other associated symptoms. Pain is sharp and severe at present Past Medical History  Diagnosis Date  . Diverticulitis    Past Surgical History  Procedure Laterality Date  . Cholecystectomy    . Cesarean section     Family History  Problem Relation Age of Onset  . Hyperlipidemia Father   . Diabetes Father    History  Substance Use Topics  . Smoking status: Never Smoker   . Smokeless tobacco: Never Used  . Alcohol Use: No   OB History   Grav Para Term Preterm Abortions TAB SAB Ect Mult Living   5 2 1 1 2  2   2      Review of Systems  Gastrointestinal: Positive for abdominal pain.  Genitourinary:       [redacted] weeks pregnant. Reports Pregnancy going normally.  All other systems reviewed and are negative.     Allergies  Review of patient's allergies indicates no known allergies.  Home Medications   Prior to Admission medications   Medication Sig Start Date End Date Taking? Authorizing Provider   acetaminophen (TYLENOL) 325 MG tablet Take 325 mg by mouth every 6 (six) hours as needed for moderate pain.     Historical Provider, MD  famotidine (PEPCID) 20 MG tablet Take 1 tablet (20 mg total) by mouth 2 (two) times daily. 06/17/14 06/17/15  Olegario Messier, NP  ondansetron (ZOFRAN) 4 MG tablet Take 1 tablet (4 mg total) by mouth every 8 (eight) hours as needed for nausea or vomiting. 07/09/14   Collene Gobble, MD  Prenatal Vit-Fe Fumarate-FA (MULTIVITAMIN-PRENATAL) 27-0.8 MG TABS tablet Take 1 tablet by mouth daily at 12 noon.    Historical Provider, MD   BP 106/58  Pulse 88  Temp(Src) 98 F (36.7 C) (Oral)  Resp 20  SpO2 100%  LMP 11/27/2013 Physical Exam  Nursing note and vitals reviewed. Constitutional: She appears well-developed and well-nourished. She appears distressed.  Mild distress appears uncomfortable  HENT:  Head: Normocephalic and atraumatic.  Eyes: Conjunctivae are normal. Pupils are equal, round, and reactive to light.  Neck: Neck supple. No tracheal deviation present. No thyromegaly present.  Cardiovascular: Normal rate and regular rhythm.   No murmur heard. Pulmonary/Chest: Effort normal and breath sounds normal.  Abdominal: Soft. Bowel sounds are normal. She exhibits no distension. There is tenderness.  Tender right lower quadrant  Musculoskeletal: Normal range of motion. She exhibits no edema and no tenderness.  Neurological: She is alert. Coordination normal.  Skin: Skin is warm and dry. No rash noted.  Psychiatric: She has a normal mood  and affect.    ED Course  Procedures (including critical care time) Labs Review Labs Reviewed - No data to display  Imaging Review No results found.   EKG Interpretation None     8:40 AM rapid response nurse reports to me the patient having mild uterine contractions 4-6 minutes apart. She requests lactated Ringer's intravenous bolus and infusion, ordered by me. Patient reports right lower quadrant pain not improved  after treatment with intravenous morphine. Additional morphine ordered by me 11:15 AM patient had no further uterine contractions after treatment with intravenous fluids. Pain is slightly improved after treatment with intravenous opioids. She did vomit one time. No further vomiting after treatment with intravenous Reglan. Results for orders placed during the hospital encounter of 07/12/14  URINALYSIS, ROUTINE W REFLEX MICROSCOPIC      Result Value Ref Range   Color, Urine YELLOW  YELLOW   APPearance CLEAR  CLEAR   Specific Gravity, Urine 1.016  1.005 - 1.030   pH 6.5  5.0 - 8.0   Glucose, UA NEGATIVE  NEGATIVE mg/dL   Hgb urine dipstick NEGATIVE  NEGATIVE   Bilirubin Urine NEGATIVE  NEGATIVE   Ketones, ur NEGATIVE  NEGATIVE mg/dL   Protein, ur NEGATIVE  NEGATIVE mg/dL   Urobilinogen, UA 0.2  0.0 - 1.0 mg/dL   Nitrite NEGATIVE  NEGATIVE   Leukocytes, UA NEGATIVE  NEGATIVE  COMPREHENSIVE METABOLIC PANEL      Result Value Ref Range   Sodium 137  137 - 147 mEq/L   Potassium 4.0  3.7 - 5.3 mEq/L   Chloride 103  96 - 112 mEq/L   CO2 19  19 - 32 mEq/L   Glucose, Bld 94  70 - 99 mg/dL   BUN 9  6 - 23 mg/dL   Creatinine, Ser 0.58  0.50 - 1.10 mg/dL   Calcium 9.0  8.4 - 10.5 mg/dL   Total Protein 6.9  6.0 - 8.3 g/dL   Albumin 2.7 (*) 3.5 - 5.2 g/dL   AST 17  0 - 37 U/L   ALT 10  0 - 35 U/L   Alkaline Phosphatase 121 (*) 39 - 117 U/L   Total Bilirubin <0.2 (*) 0.3 - 1.2 mg/dL   GFR calc non Af Amer >90  >90 mL/min   GFR calc Af Amer >90  >90 mL/min   Anion gap 15  5 - 15  CBC WITH DIFFERENTIAL      Result Value Ref Range   WBC 9.1  4.0 - 10.5 K/uL   RBC 3.55 (*) 3.87 - 5.11 MIL/uL   Hemoglobin 10.9 (*) 12.0 - 15.0 g/dL   HCT 32.9 (*) 36.0 - 46.0 %   MCV 92.7  78.0 - 100.0 fL   MCH 30.7  26.0 - 34.0 pg   MCHC 33.1  30.0 - 36.0 g/dL   RDW 12.6  11.5 - 15.5 %   Platelets 238  150 - 400 K/uL   Neutrophils Relative % 69  43 - 77 %   Neutro Abs 6.3  1.7 - 7.7 K/uL   Lymphocytes  Relative 25  12 - 46 %   Lymphs Abs 2.3  0.7 - 4.0 K/uL   Monocytes Relative 5  3 - 12 %   Monocytes Absolute 0.4  0.1 - 1.0 K/uL   Eosinophils Relative 1  0 - 5 %   Eosinophils Absolute 0.1  0.0 - 0.7 K/uL   Basophils Relative 0  0 - 1 %   Basophils  Absolute 0.0  0.0 - 0.1 K/uL   Mr Pelvis Wo Contrast  07/12/2014   CLINICAL DATA:  Twenty-nine weeks pregnant patient with right lower quadrant pain. Patient experienced nausea and vomiting. Prior history of diverticulitis.  EXAM: MRI ABDOMEN AND PELVIS WITHOUT CONTRAST  TECHNIQUE: Multiplanar multisequence MR imaging of the abdomen and pelvis was performed. No intravenous contrast was administered.  COMPARISON:  Ultrasound 07/09/2014.  CT abdomen 11/11/2013  FINDINGS: MRI ABDOMEN FINDINGS  Limited view of the liver is unremarkable. Patient status post cholecystectomy. The common bile duct is normal caliber. Pancreas is normal. Spleen is normal. Adrenal glands normal.  There is mild hydronephrosis and hydroureter of the right kidney. This degree of obstruction is felt to be within normal limits for hydronephrosis of pregnancy. No obstructing calculus is identified although MRI is less sensitive than CT for detecting calculus.  The stomach, small bowel, and cecum normal. The terminal ileum is normal. The appendix is not clearly identified; however there is no evidence of inflammation in the right lower quadrant to suggest acute appendicitis. There are diverticula of the left colon without evidence of acute inflammation.  Abdominal aorta is normal caliber. No retroperitoneal periportal lymphadenopathy.  MRI PELVIS FINDINGS  Gravid uterus. The placenta appears normal in a the posterior location. Fetus is grossly normal with cephalic presentation. Bladder is normal. The right ovary is normal size measuring 3.5 by 4.5 x 2.3 cm. The left ovary all is not well demonstrated. Left ovarian tissue may be present on image 38 of series 6. No significant free fluid in the  pelvis.  IMPRESSION: 1. No evidence acute appendicitis. Appendix is not identified; however there are no secondary signs of appendicitis. 2. Mild hydronephrosis and hydroureter on the right is felt to represent physiologic hydronephrosis of pregnancy. 3. Normal right ovary.  Left ovarian tissue is difficult to define. 4. Left colon diverticulosis without diverticulitis.   Electronically Signed   By: Suzy Bouchard M.D.   On: 07/12/2014 10:32   Mr Abdomen Wo Contrast  07/12/2014   CLINICAL DATA:  Twenty-nine weeks pregnant patient with right lower quadrant pain. Patient experienced nausea and vomiting. Prior history of diverticulitis.  EXAM: MRI ABDOMEN AND PELVIS WITHOUT CONTRAST  TECHNIQUE: Multiplanar multisequence MR imaging of the abdomen and pelvis was performed. No intravenous contrast was administered.  COMPARISON:  Ultrasound 07/09/2014.  CT abdomen 11/11/2013  FINDINGS: MRI ABDOMEN FINDINGS  Limited view of the liver is unremarkable. Patient status post cholecystectomy. The common bile duct is normal caliber. Pancreas is normal. Spleen is normal. Adrenal glands normal.  There is mild hydronephrosis and hydroureter of the right kidney. This degree of obstruction is felt to be within normal limits for hydronephrosis of pregnancy. No obstructing calculus is identified although MRI is less sensitive than CT for detecting calculus.  The stomach, small bowel, and cecum normal. The terminal ileum is normal. The appendix is not clearly identified; however there is no evidence of inflammation in the right lower quadrant to suggest acute appendicitis. There are diverticula of the left colon without evidence of acute inflammation.  Abdominal aorta is normal caliber. No retroperitoneal periportal lymphadenopathy.  MRI PELVIS FINDINGS  Gravid uterus. The placenta appears normal in a the posterior location. Fetus is grossly normal with cephalic presentation. Bladder is normal. The right ovary is normal size  measuring 3.5 by 4.5 x 2.3 cm. The left ovary all is not well demonstrated. Left ovarian tissue may be present on image 38 of series 6. No significant free  fluid in the pelvis.  IMPRESSION: 1. No evidence acute appendicitis. Appendix is not identified; however there are no secondary signs of appendicitis. 2. Mild hydronephrosis and hydroureter on the right is felt to represent physiologic hydronephrosis of pregnancy. 3. Normal right ovary.  Left ovarian tissue is difficult to define. 4. Left colon diverticulosis without diverticulitis.   Electronically Signed   By: Suzy Bouchard M.D.   On: 07/12/2014 10:32   US Renal  07/09/2014   CLINICAL DATA:  Right upper quadrant and flank pain since July 07, 2014.  EXAM: RENAL/URINARY TRACT ULTRASOUND COMPLETE  COMPARISON:  None.  FINDINGS: Right Kidney:  Length: 10.7 cm. Echogenicity within normal limits. No mass visualized. There is prominent right renal pelvis.  Left Kidney:  Length: 11.5 cm. Echogenicity within normal limits. No mass or hydronephrosis visualized.  Bladder:  Appears normal for degree of bladder distention. A left ureteral jet is identified. No right ureteral jet visualized.  IMPRESSION: Prominent right renal pelvis, this is nonspecific. No dilated right ureter identified. There is no right ureteral jets noted in the bladder. Consider further evaluation with CT urogram to assess right collecting system if clinically indicated.   Electronically Signed   By: Abelardo Diesel M.D.   On: 07/09/2014 13:49    MDM  I spoke with Dr. Elly Modena who requests transfer to maternity admissions unit [facility].Appendicits ruled out by MRI Patient is in agreement with transfer Diagnosis abdominal pain Final diagnoses:  None        Orlie Dakin, MD 07/12/14 1123

## 2014-07-12 NOTE — Progress Notes (Signed)
Dr. Burman Blacksmith in. Says he has spoken with Dr. Elly Modena and the pt is going to be sent to [facility], [facility] for further evaluation.

## 2014-07-12 NOTE — Progress Notes (Signed)
Spoke with Dr. Burman Blacksmith. Pt is having uc's ? Every 4-6 min. The rt sided pain seems to be bothering the pt more than the uc's. Order received for 559ml bolus of lr and then infuse at 121ml/hr.

## 2014-07-12 NOTE — ED Notes (Signed)
Rapid response OB called per EDP

## 2014-07-12 NOTE — H&P (Signed)
Lorraine [name] is a 28 y.o. [id number] at 107w0d admitted for abdominal pain of unclear etiology, suspect pylenephritis.     History of Present Illness: 28 year old [id number] at [redacted]w[redacted]d with severe abdominal pain of unclear etiology. She has been seen multiple times in the last several days and etiology of pain has not been elucidated. Symptoms initially began several days ago with right sided abdominal pain associated with nausea and vomiting. Was seen in [facility]U on 10/23 with negative labwork and ultrasound. Felt symptoms were likely secondary to viral gastroenteritis, so discharged to home. Symptoms persisted so seen at [facility] earlier today. Again negative lab work up. MRI was unremarkable, but given continued severe pain (requiring morphine), patient was sent to [facility] for further evaluation. Patient describes the pain as localized to RLQ with radiation to right flank. Pain is severe in nature, worse with movement. Felt feverish yesterday, but no documented fevers. Associated nausea, last episodes of emesis was earlier today. Denies hematuria, dysuria, vaginal discharge, diarrhea, constipation, or other concerning symptoms.   Patient reports the fetal movement as active. Patient reports uterine contraction  activity as irregular Patient reports  vaginal bleeding as none. Patient describes fluid per vagina as None.  Patient Active Problem List   Diagnosis Date Noted  . Pyelonephritis 07/12/2014  . Previous cesarean section complicating pregnancy 77/82/4235  . Supervision of other normal pregnancy, antepartum 03/02/2014  . H/O preterm delivery, currently pregnant 03/02/2014  . Diverticulitis 09/10/2012  . Abdominal pain 09/09/2012  . Nausea and vomiting 09/09/2012  . Dehydration 09/09/2012  . IUD contraception 09/09/2012   Past Medical History: Past Medical History  Diagnosis Date  . Diverticulitis     Past Surgical History: Past Surgical History  Procedure Laterality Date  .  Cholecystectomy    . Cesarean section      Obstetrical History: OB History   Grav Para Term Preterm Abortions TAB SAB Ect Mult Living   5 2 1 1 2  2   2       Gynecological History: negative  Social History: History   Social History  . Marital Status: Married    Spouse Name: N/A    Number of Children: N/A  . Years of Education: N/A   Social History Main Topics  . Smoking status: Never Smoker   . Smokeless tobacco: Never Used  . Alcohol Use: No  . Drug Use: No  . Sexual Activity: Yes    Birth Control/ Protection: None   Other Topics Concern  . None   Social History Narrative   ** Merged History Encounter **        Family History: Family History  Problem Relation Age of Onset  . Hyperlipidemia Father   . Diabetes Father     Allergies: No Known Allergies  Prescriptions prior to admission  Medication Sig Dispense Refill  . acetaminophen (TYLENOL) 325 MG tablet Take 325 mg by mouth every 6 (six) hours as needed for moderate pain.       . famotidine (PEPCID) 20 MG tablet Take 20 mg by mouth 2 (two) times daily.      . Prenatal Vit-Fe Fumarate-FA (MULTIVITAMIN-PRENATAL) 27-0.8 MG TABS tablet Take 1 tablet by mouth daily at 12 noon.        Review of Systems  Review of Systems  Negative except as listed per HPI.    Vitals:  Blood pressure 111/76, pulse 80, temperature 98.4 F (36.9 C), temperature source Oral, resp. rate 18, height 5' (1.524 m), weight 192  lb (87.091 kg), last menstrual period 11/27/2013, SpO2 100.00%. Physical Examination:  General appearance - in mild to moderate distress Abdomen: gravid and fundal height  is size equals dates Pelvic Exam:normal external genitalia, vulva, vagina, cervix, uterus and adnexa.  Cervix: Evaluated by digital exam. and found to be closed/ Long/Floating and fetal presentation is cephalic. No cervical motion tenderness.  Extremities: extremities normal, atraumatic, no cyanosis or edema Membranes:intact Fetal  Monitoring:Baseline: 145 bpm, Variability: Good {> 6 bpm), Accelerations: Reactive and Decelerations: Absent   Labs:  Results for orders placed during the hospital encounter of 07/12/14 (from the past 24 hour(s))  URINALYSIS, ROUTINE W REFLEX MICROSCOPIC   Collection Time    07/12/14  7:39 AM      Result Value Ref Range   Color, Urine YELLOW  YELLOW   APPearance CLEAR  CLEAR   Specific Gravity, Urine 1.016  1.005 - 1.030   pH 6.5  5.0 - 8.0   Glucose, UA NEGATIVE  NEGATIVE mg/dL   Hgb urine dipstick NEGATIVE  NEGATIVE   Bilirubin Urine NEGATIVE  NEGATIVE   Ketones, ur NEGATIVE  NEGATIVE mg/dL   Protein, ur NEGATIVE  NEGATIVE mg/dL   Urobilinogen, UA 0.2  0.0 - 1.0 mg/dL   Nitrite NEGATIVE  NEGATIVE   Leukocytes, UA NEGATIVE  NEGATIVE  COMPREHENSIVE METABOLIC PANEL   Collection Time    07/12/14  7:50 AM      Result Value Ref Range   Sodium 137  137 - 147 mEq/L   Potassium 4.0  3.7 - 5.3 mEq/L   Chloride 103  96 - 112 mEq/L   CO2 19  19 - 32 mEq/L   Glucose, Bld 94  70 - 99 mg/dL   BUN 9  6 - 23 mg/dL   Creatinine, Ser 0.58  0.50 - 1.10 mg/dL   Calcium 9.0  8.4 - 10.5 mg/dL   Total Protein 6.9  6.0 - 8.3 g/dL   Albumin 2.7 (*) 3.5 - 5.2 g/dL   AST 17  0 - 37 U/L   ALT 10  0 - 35 U/L   Alkaline Phosphatase 121 (*) 39 - 117 U/L   Total Bilirubin <0.2 (*) 0.3 - 1.2 mg/dL   GFR calc non Af Amer >90  >90 mL/min   GFR calc Af Amer >90  >90 mL/min   Anion gap 15  5 - 15  CBC WITH DIFFERENTIAL   Collection Time    07/12/14  7:50 AM      Result Value Ref Range   WBC 9.1  4.0 - 10.5 K/uL   RBC 3.55 (*) 3.87 - 5.11 MIL/uL   Hemoglobin 10.9 (*) 12.0 - 15.0 g/dL   HCT 32.9 (*) 36.0 - 46.0 %   MCV 92.7  78.0 - 100.0 fL   MCH 30.7  26.0 - 34.0 pg   MCHC 33.1  30.0 - 36.0 g/dL   RDW 12.6  11.5 - 15.5 %   Platelets 238  150 - 400 K/uL   Neutrophils Relative % 69  43 - 77 %   Neutro Abs 6.3  1.7 - 7.7 K/uL   Lymphocytes Relative 25  12 - 46 %   Lymphs Abs 2.3  0.7 - 4.0 K/uL    Monocytes Relative 5  3 - 12 %   Monocytes Absolute 0.4  0.1 - 1.0 K/uL   Eosinophils Relative 1  0 - 5 %   Eosinophils Absolute 0.1  0.0 - 0.7 K/uL   Basophils Relative  0  0 - 1 %   Basophils Absolute 0.0  0.0 - 0.1 K/uL  WET PREP, GENITAL   Collection Time    07/12/14  2:45 PM      Result Value Ref Range   Yeast Wet Prep HPF POC FEW (*) NONE SEEN   Trich, Wet Prep   (*) NONE SEEN   Value: Multiple bacterial morphotypes present, none predominant. Suggest appropriate recollection if clinically indicated.   Clue Cells Wet Prep HPF POC NONE SEEN  NONE SEEN   WBC, Wet Prep HPF POC FEW (*) NONE SEEN  HIV ANTIBODY (ROUTINE TESTING)   Collection Time    07/12/14  2:47 PM      Result Value Ref Range   HIV 1&2 Ab, 4th Generation NONREACTIVE  NONREACTIVE    Imaging Studies: Mr Pelvis Wo Contrast  07/12/2014   CLINICAL DATA:  Twenty-nine weeks pregnant patient with right lower quadrant pain. Patient experienced nausea and vomiting. Prior history of diverticulitis.  EXAM: MRI ABDOMEN AND PELVIS WITHOUT CONTRAST  TECHNIQUE: Multiplanar multisequence MR imaging of the abdomen and pelvis was performed. No intravenous contrast was administered.  COMPARISON:  Ultrasound 07/09/2014.  CT abdomen 11/11/2013  FINDINGS: MRI ABDOMEN FINDINGS  Limited view of the liver is unremarkable. Patient status post cholecystectomy. The common bile duct is normal caliber. Pancreas is normal. Spleen is normal. Adrenal glands normal.  There is mild hydronephrosis and hydroureter of the right kidney. This degree of obstruction is felt to be within normal limits for hydronephrosis of pregnancy. No obstructing calculus is identified although MRI is less sensitive than CT for detecting calculus.  The stomach, small bowel, and cecum normal. The terminal ileum is normal. The appendix is not clearly identified; however there is no evidence of inflammation in the right lower quadrant to suggest acute appendicitis. There are  diverticula of the left colon without evidence of acute inflammation.  Abdominal aorta is normal caliber. No retroperitoneal periportal lymphadenopathy.  MRI PELVIS FINDINGS  Gravid uterus. The placenta appears normal in a the posterior location. Fetus is grossly normal with cephalic presentation. Bladder is normal. The right ovary is normal size measuring 3.5 by 4.5 x 2.3 cm. The left ovary all is not well demonstrated. Left ovarian tissue may be present on image 38 of series 6. No significant free fluid in the pelvis.  IMPRESSION: 1. No evidence acute appendicitis. Appendix is not identified; however there are no secondary signs of appendicitis. 2. Mild hydronephrosis and hydroureter on the right is felt to represent physiologic hydronephrosis of pregnancy. 3. Normal right ovary.  Left ovarian tissue is difficult to define. 4. Left colon diverticulosis without diverticulitis.   Electronically Signed   By: Suzy Bouchard M.D.   On: 07/12/2014 10:32   Mr Abdomen Wo Contrast  07/12/2014   CLINICAL DATA:  Twenty-nine weeks pregnant patient with right lower quadrant pain. Patient experienced nausea and vomiting. Prior history of diverticulitis.  EXAM: MRI ABDOMEN AND PELVIS WITHOUT CONTRAST  TECHNIQUE: Multiplanar multisequence MR imaging of the abdomen and pelvis was performed. No intravenous contrast was administered.  COMPARISON:  Ultrasound 07/09/2014.  CT abdomen 11/11/2013  FINDINGS: MRI ABDOMEN FINDINGS  Limited view of the liver is unremarkable. Patient status post cholecystectomy. The common bile duct is normal caliber. Pancreas is normal. Spleen is normal. Adrenal glands normal.  There is mild hydronephrosis and hydroureter of the right kidney. This degree of obstruction is felt to be within normal limits for hydronephrosis of pregnancy. No obstructing calculus is  identified although MRI is less sensitive than CT for detecting calculus.  The stomach, small bowel, and cecum normal. The terminal ileum is  normal. The appendix is not clearly identified; however there is no evidence of inflammation in the right lower quadrant to suggest acute appendicitis. There are diverticula of the left colon without evidence of acute inflammation.  Abdominal aorta is normal caliber. No retroperitoneal periportal lymphadenopathy.  MRI PELVIS FINDINGS  Gravid uterus. The placenta appears normal in a the posterior location. Fetus is grossly normal with cephalic presentation. Bladder is normal. The right ovary is normal size measuring 3.5 by 4.5 x 2.3 cm. The left ovary all is not well demonstrated. Left ovarian tissue may be present on image 38 of series 6. No significant free fluid in the pelvis.  IMPRESSION: 1. No evidence acute appendicitis. Appendix is not identified; however there are no secondary signs of appendicitis. 2. Mild hydronephrosis and hydroureter on the right is felt to represent physiologic hydronephrosis of pregnancy. 3. Normal right ovary.  Left ovarian tissue is difficult to define. 4. Left colon diverticulosis without diverticulitis.   Electronically Signed   By: Suzy Bouchard M.D.   On: 07/12/2014 10:32   US Renal  07/09/2014   CLINICAL DATA:  Right upper quadrant and flank pain since July 07, 2014.  EXAM: RENAL/URINARY TRACT ULTRASOUND COMPLETE  COMPARISON:  None.  FINDINGS: Right Kidney:  Length: 10.7 cm. Echogenicity within normal limits. No mass visualized. There is prominent right renal pelvis.  Left Kidney:  Length: 11.5 cm. Echogenicity within normal limits. No mass or hydronephrosis visualized.  Bladder:  Appears normal for degree of bladder distention. A left ureteral jet is identified. No right ureteral jet visualized.  IMPRESSION: Prominent right renal pelvis, this is nonspecific. No dilated right ureter identified. There is no right ureteral jets noted in the bladder. Consider further evaluation with CT urogram to assess right collecting system if clinically indicated.   Electronically  Signed   By: Abelardo Diesel M.D.   On: 07/09/2014 13:49    . cefTRIAXone (ROCEPHIN)  IV  1 g Intravenous Q12H  . [START ON 07/13/2014] docusate sodium  100 mg Oral Daily  . famotidine  20 mg Oral BID  . [START ON 07/13/2014] prenatal multivitamin  1 tablet Oral Q1200   I have reviewed the patient's current medications.   ASSESSMENT: Patient Active Problem List   Diagnosis Date Noted  . Pyelonephritis 07/12/2014  . Previous cesarean section complicating pregnancy 03/50/0938  . Supervision of other normal pregnancy, antepartum 03/02/2014  . H/O preterm delivery, currently pregnant 03/02/2014  . Diverticulitis 09/10/2012  . Abdominal pain 09/09/2012  . Nausea and vomiting 09/09/2012  . Dehydration 09/09/2012  . IUD contraception 09/09/2012   28 year old [id number] at [redacted]w[redacted]d with several days of abdominal pain with unclear etiology. Given severity of pain will admit to antenatal. Will treat as possible pyelonephritis given history and examination.   #Pyelonephritis, suspected - CTX 1 gm q12 - GC/CT pending - LR @125  cc/hr - Zofran prn - Morphine prn - Urine culture pending  #FWB.  - Reactive and reassuring  - NST per shift  Shelbie Hutching MD

## 2014-07-12 NOTE — ED Notes (Signed)
Patient transported to MRI 

## 2014-07-12 NOTE — Progress Notes (Signed)
EFM dc'd. Pt transferred to [facility]U by [facility].

## 2014-07-12 NOTE — Progress Notes (Addendum)
Pt presents with rt sided abd pain. [id number]. ED RN at pt's bedside says that pt does not speak very good english and that they have been using an interpreter over the phone. Says pt denies any vaginal bleeding or leaking of fluid. Pt is [redacted] weeks pregnant. Labs have been drawn, in and out cath done, pt denies any urinary symptoms. I am waiting for them to start her iv before i place the fetal monitor. Pt was seen on 07/09/14 at [facility] with c/o rt sided abd pain and N&V. CBC with Diff, CMP, urinalysis was nl. Pt was afebrile. Pt is a previous C/S, has had a cholecystectomy, and has a hx of diverticulitis.

## 2014-07-12 NOTE — Progress Notes (Signed)
Spoke with Dr. Elly Modena. FHR is a category 1 tracing, no uc's. Pt is still in pain and has received a total of 12mg  of IV morphine. Pt is nauseted and has recently received reglan . Dr. Burman Blacksmith will be calling her.

## 2014-07-13 ENCOUNTER — Encounter (HOSPITAL_COMMUNITY): Payer: Self-pay | Admitting: *Deleted

## 2014-07-13 ENCOUNTER — Encounter: Payer: Self-pay | Admitting: Obstetrics & Gynecology

## 2014-07-13 DIAGNOSIS — O2302 Infections of kidney in pregnancy, second trimester: Secondary | ICD-10-CM

## 2014-07-13 DIAGNOSIS — Z3A29 29 weeks gestation of pregnancy: Secondary | ICD-10-CM

## 2014-07-13 LAB — LIPASE, BLOOD: Lipase: 20 U/L (ref 11–59)

## 2014-07-13 LAB — URINE CULTURE
Colony Count: NO GROWTH
Culture: NO GROWTH

## 2014-07-13 LAB — GC/CHLAMYDIA PROBE AMP
CT PROBE, AMP APTIMA: NEGATIVE
GC PROBE AMP APTIMA: NEGATIVE

## 2014-07-13 NOTE — Progress Notes (Signed)
Patient ID: Lorraine [name], female   DOB: June 28, 1986, 28 y.o.   MRN: [id number] [place]) NOTE  Lorraine [name] is a 28 y.o. [id number] at [redacted]w[redacted]d  who is admitted for evaluation of abdominal pain.    Fetal presentation is cephalic. Length of Stay:  1  Days  Date of admission:07/12/2014  Subjective: Patient reports persistent right sided abdominal pain which is more localized to the RLQ. The pain is Lydiann Bonifas and gets worst with contractions. Morphine seems to help a little with the pain but does not eliminate it. She reports having a normal bowel movement yesterday evening. She had an episode of emesis following dinner yesterday. Patient reports the fetal movement as active. Patient reports uterine contraction  activity as irregular. Patient reports  vaginal bleeding as none. Patient describes fluid per vagina as None.  Vitals:  Blood pressure 98/53, pulse 79, temperature 98.4 F (36.9 C), temperature source Oral, resp. rate 16, height 5' (1.524 m), weight 192 lb (87.091 kg), last menstrual period 11/27/2013, SpO2 100.00%. Filed Vitals:   07/12/14 1133 07/12/14 1205 07/12/14 1900 07/13/14 0308  BP:  112/63 111/76 98/53  Pulse: 80 80 80 79  Temp:  97.9 F (36.6 C) 98.4 F (36.9 C)   TempSrc:  Oral Oral   Resp: 19 18 18 16   Height:   5' (1.524 m)   Weight:   192 lb (87.091 kg)   SpO2: 100%      Physical Examination:  General appearance - alert, well appearing, and in no distress Fundal Height:  size equals dates Pelvic Exam:  examination not indicated Cervical Exam: Not evaluated.  Extremities: Homans sign is negative, no sign of DVT with DTRs 2+ bilaterally Membranes:intact Abdomen: soft, gravid, tenderness in RLQ, no rebound, no guarding  Fetal Monitoring:  Baseline: 150 bpm, Variability: Good {> 6 bpm), Accelerations: Non-reactive but appropriate for gestational age, Decelerations: Absent and [place]: irregular contractions     Labs:  Results for  orders placed during the hospital encounter of 07/12/14 (from the past 24 hour(s))  URINALYSIS, ROUTINE W REFLEX MICROSCOPIC   Collection Time    07/12/14  7:39 AM      Result Value Ref Range   Color, Urine YELLOW  YELLOW   APPearance CLEAR  CLEAR   Specific Gravity, Urine 1.016  1.005 - 1.030   pH 6.5  5.0 - 8.0   Glucose, UA NEGATIVE  NEGATIVE mg/dL   Hgb urine dipstick NEGATIVE  NEGATIVE   Bilirubin Urine NEGATIVE  NEGATIVE   Ketones, ur NEGATIVE  NEGATIVE mg/dL   Protein, ur NEGATIVE  NEGATIVE mg/dL   Urobilinogen, UA 0.2  0.0 - 1.0 mg/dL   Nitrite NEGATIVE  NEGATIVE   Leukocytes, UA NEGATIVE  NEGATIVE  COMPREHENSIVE METABOLIC PANEL   Collection Time    07/12/14  7:50 AM      Result Value Ref Range   Sodium 137  137 - 147 mEq/L   Potassium 4.0  3.7 - 5.3 mEq/L   Chloride 103  96 - 112 mEq/L   CO2 19  19 - 32 mEq/L   Glucose, Bld 94  70 - 99 mg/dL   BUN 9  6 - 23 mg/dL   Creatinine, Ser 0.58  0.50 - 1.10 mg/dL   Calcium 9.0  8.4 - 10.5 mg/dL   Total Protein 6.9  6.0 - 8.3 g/dL   Albumin 2.7 (*) 3.5 - 5.2 g/dL   AST 17  0 - 37 U/L   ALT 10  0 - 35 U/L   Alkaline Phosphatase 121 (*) 39 - 117 U/L   Total Bilirubin <0.2 (*) 0.3 - 1.2 mg/dL   GFR calc non Af Amer >90  >90 mL/min   GFR calc Af Amer >90  >90 mL/min   Anion gap 15  5 - 15  CBC WITH DIFFERENTIAL   Collection Time    07/12/14  7:50 AM      Result Value Ref Range   WBC 9.1  4.0 - 10.5 K/uL   RBC 3.55 (*) 3.87 - 5.11 MIL/uL   Hemoglobin 10.9 (*) 12.0 - 15.0 g/dL   HCT 32.9 (*) 36.0 - 46.0 %   MCV 92.7  78.0 - 100.0 fL   MCH 30.7  26.0 - 34.0 pg   MCHC 33.1  30.0 - 36.0 g/dL   RDW 12.6  11.5 - 15.5 %   Platelets 238  150 - 400 K/uL   Neutrophils Relative % 69  43 - 77 %   Neutro Abs 6.3  1.7 - 7.7 K/uL   Lymphocytes Relative 25  12 - 46 %   Lymphs Abs 2.3  0.7 - 4.0 K/uL   Monocytes Relative 5  3 - 12 %   Monocytes Absolute 0.4  0.1 - 1.0 K/uL   Eosinophils Relative 1  0 - 5 %   Eosinophils Absolute  0.1  0.0 - 0.7 K/uL   Basophils Relative 0  0 - 1 %   Basophils Absolute 0.0  0.0 - 0.1 K/uL  WET PREP, GENITAL   Collection Time    07/12/14  2:45 PM      Result Value Ref Range   Yeast Wet Prep HPF POC FEW (*) NONE SEEN   Trich, Wet Prep   (*) NONE SEEN   Value: Multiple bacterial morphotypes present, none predominant. Suggest appropriate recollection if clinically indicated.   Clue Cells Wet Prep HPF POC NONE SEEN  NONE SEEN   WBC, Wet Prep HPF POC FEW (*) NONE SEEN  HIV ANTIBODY (ROUTINE TESTING)   Collection Time    07/12/14  2:47 PM      Result Value Ref Range   HIV 1&2 Ab, 4th Generation NONREACTIVE  NONREACTIVE    Imaging Studies:    07/12/2014 MRI IMPRESSION:  1. No evidence acute appendicitis. Appendix is not identified;  however there are no secondary signs of appendicitis.  2. Mild hydronephrosis and hydroureter on the right is felt to  represent physiologic hydronephrosis of pregnancy.  3. Normal right ovary. Left ovarian tissue is difficult to define.  4. Left colon diverticulosis without diverticulitis.   Medications:  Scheduled . cefTRIAXone (ROCEPHIN)  IV  1 g Intravenous Q12H  . docusate sodium  100 mg Oral Daily  . famotidine  20 mg Oral BID  . prenatal multivitamin  1 tablet Oral Q1200   I have reviewed the patient's current medications.  ASSESSMENT: [id number] [redacted]w[redacted]d Estimated Date of Delivery: 09/27/14  Patient Active Problem List   Diagnosis Date Noted  . Previous cesarean section complicating pregnancy 56/97/9480    Priority: Medium  . Supervision of other normal pregnancy, antepartum 03/02/2014    Priority: Medium  . H/O preterm delivery, currently pregnant 03/02/2014    Priority: Medium  . Pyelonephritis 07/12/2014  . Diverticulitis 09/10/2012  . Abdominal pain 09/09/2012  . Nausea and vomiting 09/09/2012  . Dehydration 09/09/2012  . IUD contraception 09/09/2012    PLAN: Continue treatment for presumed pyelonephritis Consider general  surgery consult, even with  negative MRI given persistent pain since 10/23 Continue current care  Uthman Mroczkowski 07/13/2014,6:39 AM

## 2014-07-13 NOTE — Progress Notes (Signed)
Spoke with pt using house interpreter

## 2014-07-13 NOTE — Consult Note (Signed)
Reason for Consult:  RLQ pain, concern for appendicitis Referring Physician: Dr. Marijean Heath Direnzo is an 28 y.o. female.  HPI: She is 29 weeks 5. She had the onset of right lower quadrant pain 6 days ago. She had 2 episodes of nausea and vomiting with the pain.  She presented to [facility] 4 days ago and was evaluated.  At that time, she had a normal white blood cell count with a normal differential, a normal urinalysis, and ultrasound demonstrated a prominent right renal pelvis.  It was felt that she had gastroenteritis. She felt better after IV fluid hydration, dilaudid, and Zofran.  She presented to [facility] yesterday because the pain was worse. It was sharp in nature. He was in the right lower quadrant/pelvic area. The pain did not get better with Pepcid or Tylenol. She had a decreased appetite. She was not having nausea, vomiting, or diarrhea at this time. She is not having any dysuria. She had a normal bowel movement yesterday before coming to the emergency department.  She wants again had a normal white blood cell count with normal differential. Urinalysis was normal. MRI was performed which did not demonstrate any evidence of a right lower quadrant inflammatory process. The appendix was not definitely visualized.  Overall, there is no evidence of acute appendicitis. She does have some mild right hydroureter which is felt to be "physiologic hydronephrosis of pregnancy." Right ovary is felt to be normal. Left colon diverticulosis is noted without evidence of diverticulitis. She was admitted to [facility]. We were asked to see her to evaluate her for acute appendicitis.  Past Medical History  Diagnosis Date  . Diverticulitis     Past Surgical History  Procedure Laterality Date  . Cholecystectomy    . Cesarean section      Family History  Problem Relation Age of Onset  . Hyperlipidemia Father   . Diabetes Father     Social History:  reports that she has never smoked.  She has never used smokeless tobacco. She reports that she does not drink alcohol or use illicit drugs.  Allergies: No Known Allergies  Current facility-administered medications:0.9 %  sodium chloride infusion, , Intravenous, Continuous, Peggy Constant, MD, Last Rate: 125 mL/hr at 07/13/14 1551;  acetaminophen (TYLENOL) tablet 650 mg, 650 mg, Oral, Q4H PRN, Shelbie Hutching, MD;  calcium carbonate (TUMS - dosed in mg elemental calcium) chewable tablet 400 mg of elemental calcium, 2 tablet, Oral, Q4H PRN, Shelbie Hutching, MD cefTRIAXone (ROCEPHIN) 1 g in dextrose 5 % 50 mL IVPB, 1 g, Intravenous, Q12H, Shelbie Hutching, MD, 1 g at 07/13/14 6568;  docusate sodium (COLACE) capsule 100 mg, 100 mg, Oral, Daily, Shelbie Hutching, MD;  famotidine (PEPCID) tablet 20 mg, 20 mg, Oral, BID, Shelbie Hutching, MD, 20 mg at 07/13/14 1202;  morphine 4 MG/ML injection 4 mg, 4 mg, Intravenous, Q4H PRN, Shelbie Hutching, MD, 4 mg at 07/13/14 1339 ondansetron (ZOFRAN) injection 4 mg, 4 mg, Intravenous, Q8H PRN, Shelbie Hutching, MD, 4 mg at 07/12/14 2146;  prenatal multivitamin tablet 1 tablet, 1 tablet, Oral, Q1200, Shelbie Hutching, MD;  zolpidem (AMBIEN) tablet 5 mg, 5 mg, Oral, QHS PRN, Shelbie Hutching, MD  Results for orders placed during the hospital encounter of 07/12/14 (from the past 48 hour(s))  URINALYSIS, ROUTINE W REFLEX MICROSCOPIC     Status: None   Collection Time    07/12/14  7:39 AM      Result Value Ref Range   Color, Urine YELLOW  YELLOW   APPearance CLEAR  CLEAR   Specific Gravity, Urine 1.016  1.005 - 1.030   pH 6.5  5.0 - 8.0   Glucose, UA NEGATIVE  NEGATIVE mg/dL   Hgb urine dipstick NEGATIVE  NEGATIVE   Bilirubin Urine NEGATIVE  NEGATIVE   Ketones, ur NEGATIVE  NEGATIVE mg/dL   Protein, ur NEGATIVE  NEGATIVE mg/dL   Urobilinogen, UA 0.2  0.0 - 1.0 mg/dL   Nitrite NEGATIVE  NEGATIVE   Leukocytes, UA NEGATIVE  NEGATIVE   Comment: MICROSCOPIC NOT DONE ON URINES WITH NEGATIVE PROTEIN,  BLOOD, LEUKOCYTES, NITRITE, OR GLUCOSE <1000 mg/dL.  URINE CULTURE     Status: None   Collection Time    07/12/14  7:39 AM      Result Value Ref Range   Specimen Description URINE, CATHETERIZED     Special Requests NONE     Culture  Setup Time       Value: 07/12/2014 15:37     Performed at [facility]   Colony Count       Value: NO GROWTH     Performed at [facility]   Culture       Value: NO GROWTH     Performed at [facility]   Report Status 07/13/2014 FINAL    COMPREHENSIVE METABOLIC PANEL     Status: Abnormal   Collection Time    07/12/14  7:50 AM      Result Value Ref Range   Sodium 137  137 - 147 mEq/L   Potassium 4.0  3.7 - 5.3 mEq/L   Chloride 103  96 - 112 mEq/L   CO2 19  19 - 32 mEq/L   Glucose, Bld 94  70 - 99 mg/dL   BUN 9  6 - 23 mg/dL   Creatinine, Ser 8.59  0.50 - 1.10 mg/dL   Calcium 9.0  8.4 - 17.2 mg/dL   Total Protein 6.9  6.0 - 8.3 g/dL   Albumin 2.7 (*) 3.5 - 5.2 g/dL   AST 17  0 - 37 U/L   ALT 10  0 - 35 U/L   Alkaline Phosphatase 121 (*) 39 - 117 U/L   Total Bilirubin <0.2 (*) 0.3 - 1.2 mg/dL   GFR calc non Af Amer >90  >90 mL/min   GFR calc Af Amer >90  >90 mL/min   Comment: (NOTE)     The eGFR has been calculated using the CKD EPI equation.     This calculation has not been validated in all clinical situations.     eGFR's persistently <90 mL/min signify possible Chronic Kidney     Disease.   Anion gap 15  5 - 15  CBC WITH DIFFERENTIAL     Status: Abnormal   Collection Time    07/12/14  7:50 AM      Result Value Ref Range   WBC 9.1  4.0 - 10.5 K/uL   RBC 3.55 (*) 3.87 - 5.11 MIL/uL   Hemoglobin 10.9 (*) 12.0 - 15.0 g/dL   HCT 14.6 (*) 19.6 - 89.1 %   MCV 92.7  78.0 - 100.0 fL   MCH 30.7  26.0 - 34.0 pg   MCHC 33.1  30.0 - 36.0 g/dL   RDW 01.9  19.9 - 18.1 %   Platelets 238  150 - 400 K/uL   Neutrophils Relative % 69  43 - 77 %   Neutro Abs 6.3  1.7 - 7.7 K/uL   Lymphocytes  Relative 25  12 - 46 %   Lymphs Abs  2.3  0.7 - 4.0 K/uL   Monocytes Relative 5  3 - 12 %   Monocytes Absolute 0.4  0.1 - 1.0 K/uL   Eosinophils Relative 1  0 - 5 %   Eosinophils Absolute 0.1  0.0 - 0.7 K/uL   Basophils Relative 0  0 - 1 %   Basophils Absolute 0.0  0.0 - 0.1 K/uL  GC/CHLAMYDIA PROBE AMP     Status: None   Collection Time    07/12/14  2:45 PM      Result Value Ref Range   CT Probe RNA NEGATIVE  NEGATIVE   GC Probe RNA NEGATIVE  NEGATIVE   Comment: (NOTE)                                                                                               **Normal Reference Range: Negative**          Assay performed using the Gen-Probe APTIMA COMBO2 (R) Assay.     Acceptable specimen types for this assay include APTIMA Swabs (Unisex,     endocervical, urethral, or vaginal), first void urine, and ThinPrep     liquid based cytology samples.     Performed at [place]     Status: Abnormal   Collection Time    07/12/14  2:45 PM      Result Value Ref Range   Yeast Wet Prep HPF POC FEW (*) NONE SEEN   Trich, Wet Prep   (*) NONE SEEN   Value: Multiple bacterial morphotypes present, none predominant. Suggest appropriate recollection if clinically indicated.   Clue Cells Wet Prep HPF POC NONE SEEN  NONE SEEN   WBC, Wet Prep HPF POC FEW (*) NONE SEEN   Comment: MODERATE BACTERIA SEEN  HIV ANTIBODY (ROUTINE TESTING)     Status: None   Collection Time    07/12/14  2:47 PM      Result Value Ref Range   HIV 1&2 Ab, 4th Generation NONREACTIVE  NONREACTIVE   Comment: (NOTE)     A NONREACTIVE HIV Ag/Ab result does not exclude HIV infection since     the time frame for seroconversion is variable. If acute HIV infection     is suspected, a HIV-1 RNA Qualitative TMA test is recommended.     HIV-1/2 Antibody Diff         Not indicated.     HIV-1 RNA, Qual TMA           Not indicated.     PLEASE NOTE: This information has been disclosed to you from records     whose confidentiality may be  protected by state law. If your state     requires such protection, then the state law prohibits you from making     any further disclosure of the information without the specific written     consent of the person to whom it pertains, or as otherwise permitted     by law. A general authorization for the release of medical  or other     information is NOT sufficient for this purpose.     The performance of this assay has not been clinically validated in     patients less than 28 years old.     Performed at [facility]    Mr Pelvis Wo Contrast  07/12/2014   CLINICAL DATA:  Twenty-nine weeks pregnant patient with right lower quadrant pain. Patient experienced nausea and vomiting. Prior history of diverticulitis.  EXAM: MRI ABDOMEN AND PELVIS WITHOUT CONTRAST  TECHNIQUE: Multiplanar multisequence MR imaging of the abdomen and pelvis was performed. No intravenous contrast was administered.  COMPARISON:  Ultrasound 07/09/2014.  CT abdomen 11/11/2013  FINDINGS: MRI ABDOMEN FINDINGS  Limited view of the liver is unremarkable. Patient status post cholecystectomy. The common bile duct is normal caliber. Pancreas is normal. Spleen is normal. Adrenal glands normal.  There is mild hydronephrosis and hydroureter of the right kidney. This degree of obstruction is felt to be within normal limits for hydronephrosis of pregnancy. No obstructing calculus is identified although MRI is less sensitive than CT for detecting calculus.  The stomach, small bowel, and cecum normal. The terminal ileum is normal. The appendix is not clearly identified; however there is no evidence of inflammation in the right lower quadrant to suggest acute appendicitis. There are diverticula of the left colon without evidence of acute inflammation.  Abdominal aorta is normal caliber. No retroperitoneal periportal lymphadenopathy.  MRI PELVIS FINDINGS  Gravid uterus. The placenta appears normal in a the posterior location. Fetus is grossly  normal with cephalic presentation. Bladder is normal. The right ovary is normal size measuring 3.5 by 4.5 x 2.3 cm. The left ovary all is not well demonstrated. Left ovarian tissue may be present on image 38 of series 6. No significant free fluid in the pelvis.  IMPRESSION: 1. No evidence acute appendicitis. Appendix is not identified; however there are no secondary signs of appendicitis. 2. Mild hydronephrosis and hydroureter on the right is felt to represent physiologic hydronephrosis of pregnancy. 3. Normal right ovary.  Left ovarian tissue is difficult to define. 4. Left colon diverticulosis without diverticulitis.   Electronically Signed   By: Suzy Bouchard M.D.   On: 07/12/2014 10:32   Mr Abdomen Wo Contrast  07/12/2014   CLINICAL DATA:  Twenty-nine weeks pregnant patient with right lower quadrant pain. Patient experienced nausea and vomiting. Prior history of diverticulitis.  EXAM: MRI ABDOMEN AND PELVIS WITHOUT CONTRAST  TECHNIQUE: Multiplanar multisequence MR imaging of the abdomen and pelvis was performed. No intravenous contrast was administered.  COMPARISON:  Ultrasound 07/09/2014.  CT abdomen 11/11/2013  FINDINGS: MRI ABDOMEN FINDINGS  Limited view of the liver is unremarkable. Patient status post cholecystectomy. The common bile duct is normal caliber. Pancreas is normal. Spleen is normal. Adrenal glands normal.  There is mild hydronephrosis and hydroureter of the right kidney. This degree of obstruction is felt to be within normal limits for hydronephrosis of pregnancy. No obstructing calculus is identified although MRI is less sensitive than CT for detecting calculus.  The stomach, small bowel, and cecum normal. The terminal ileum is normal. The appendix is not clearly identified; however there is no evidence of inflammation in the right lower quadrant to suggest acute appendicitis. There are diverticula of the left colon without evidence of acute inflammation.  Abdominal aorta is normal  caliber. No retroperitoneal periportal lymphadenopathy.  MRI PELVIS FINDINGS  Gravid uterus. The placenta appears normal in a the posterior location. Fetus is grossly normal with  cephalic presentation. Bladder is normal. The right ovary is normal size measuring 3.5 by 4.5 x 2.3 cm. The left ovary all is not well demonstrated. Left ovarian tissue may be present on image 38 of series 6. No significant free fluid in the pelvis.  IMPRESSION: 1. No evidence acute appendicitis. Appendix is not identified; however there are no secondary signs of appendicitis. 2. Mild hydronephrosis and hydroureter on the right is felt to represent physiologic hydronephrosis of pregnancy. 3. Normal right ovary.  Left ovarian tissue is difficult to define. 4. Left colon diverticulosis without diverticulitis.   Electronically Signed   By: Suzy Bouchard M.D.   On: 07/12/2014 10:32    Review of Systems  Constitutional: Negative for fever and chills.  HENT: Negative for sore throat.   Gastrointestinal: Positive for abdominal pain. Negative for diarrhea.  Genitourinary: Negative for dysuria.   Blood pressure 110/60, pulse 75, temperature 98.1 F (36.7 C), temperature source Oral, resp. rate 18, height 5' (1.524 m), weight 192 lb (87.091 kg), last menstrual period 11/27/2013, SpO2 100.00%. Physical Exam  Constitutional:  Well-developed, well-nourished pregnant female.  Eyes: EOM are normal. No scleral icterus.  Neck: Neck supple.  GI: Bowel sounds are normal. There is tenderness (right pelvic regionlateral to the symphysis pubis. This tenderness is rated a 7 out of 10. Mild tenderness at McBurney's point and just above it which is rated a 2out of 10.).  Palpable uterus above the umbilicus.  Lymphadenopathy:    She has no cervical adenopathy.    Assessment/Plan: Right lower quadrant/pelvic pain. Normal white cell count and differential twice. Normal urinalysis. No MRI evidence of an acute inflammatory process in the right  lower quadrant. Location of pain and tenderness are atypical for appendicitis at this gestational age.  I feel she does not have acute appendicitis.  Rec:  No acute surgical intervention needed. Even though the right hydronephrosis is considered physiologic for her pregnancy state, it may be helpful to get a urology opinion.  Inella Kuwahara J 07/13/2014, 3:48 PM

## 2014-07-14 ENCOUNTER — Inpatient Hospital Stay (HOSPITAL_COMMUNITY): Payer: Self-pay

## 2014-07-14 NOTE — Progress Notes (Signed)
Patient ID: Lorraine [name], female   DOB: 1986-04-21, 27 y.o.   MRN: [id number]  FACULTY PRACTICE ANTEPARTUM NOTE  Lorraine [name] is a 28 y.o. [id number] at [redacted]w[redacted]d  who is admitted for right lower quadrant pain.   Fetal presentation is cephalic. Length of Stay:  2  Days  Subjective:  She reports improved slightly abdominal pain and has required less pain medicine per nursing.  Pain worse with contractions and movement.  Still has poor appetite.  Patient reports good fetal movement.  She reports uterine contractions, which she estimates to come every 5 minutes.  She denies bleeding and no loss of fluid per vagina.  Vitals:  Blood pressure 106/57, pulse 79, temperature 98.2 F (36.8 C), temperature source Oral, resp. rate 18, height 5' (1.524 m), weight 192 lb (87.091 kg), last menstrual period 11/27/2013, SpO2 100.00%. Physical Examination:  General appearance - alert, well appearing, and in no distress Abdomen - soft, tender in the RLQ and over bladder.   Fundal Height:  size equals dates Pelvic Exam:  normal external genitalia, vulva, vagina, cervix, uterus and adnexa Cervical Exam: Evaluated by digital exam., Dilation: 0cm, 50%/-2 and fetal presentation is cephalicExtremities: extremities normal, atraumatic, no cyanosis or edema   Fetal Monitoring:  Baseline: 150 bpm, Variability: Good {> 6 bpm), Accelerations: 10x10 and Decelerations: Absent  Labs:  No results found for this or any previous visit (from the past 24 hour(s)).  Imaging Studies:    MRI: IMPRESSION:  1. No evidence acute appendicitis. Appendix is not identified;  however there are no secondary signs of appendicitis.  2. Mild hydronephrosis and hydroureter on the right is felt to  represent physiologic hydronephrosis of pregnancy.  3. Normal right ovary. Left ovarian tissue is difficult to define.  4. Left colon diverticulosis without diverticulitis.  Renal US: IMPRESSION:  Prominent right renal pelvis, this is  nonspecific. No dilated right  ureter identified. There is no right ureteral jets noted in the  bladder. Consider further evaluation with CT urogram to assess right  collecting system if clinically indicated.   Medications:  Scheduled . cefTRIAXone (ROCEPHIN)  IV  1 g Intravenous Q12H  . docusate sodium  100 mg Oral Daily  . famotidine  20 mg Oral BID  . prenatal multivitamin  1 tablet Oral Q1200   I have reviewed the patient's current medications.  ASSESSMENT: Patient Active Problem List   Diagnosis Date Noted  . Pyelonephritis 07/12/2014  . Previous cesarean section complicating pregnancy 27/25/3664  . Supervision of other normal pregnancy, antepartum 03/02/2014  . H/O preterm delivery, currently pregnant 03/02/2014  . Diverticulitis 09/10/2012  . Abdominal pain 09/09/2012  . Nausea and vomiting 09/09/2012  . Dehydration 09/09/2012  . IUD contraception 09/09/2012    PLAN: Patient placed on monitor to evaluate contractions Will get fetal US to evaluate placenta and baby. Transvaginal US to evaluate for right sided ureterolithiasis.  Continue pain management. Continue routine antenatal care.   Lorraine [name] Lorraine [name] 07/14/2014,7:00 AM

## 2014-07-15 MED ORDER — IBUPROFEN 600 MG PO TABS: 600.0000 mg | ORAL_TABLET | Freq: Four times a day (QID) | ORAL | Status: DC | PRN

## 2014-07-15 MED ORDER — OXYCODONE-ACETAMINOPHEN 5-325 MG PO TABS: 1.0000 | ORAL_TABLET | ORAL | Status: DC | PRN

## 2014-07-15 MED ORDER — OXYCODONE-ACETAMINOPHEN 5-325 MG PO TABS
1.0000 | ORAL_TABLET | ORAL | Status: DC | PRN
  Administered 2014-07-15: 2 via ORAL
  Filled 2014-07-15: qty 2

## 2014-07-15 MED ORDER — CYCLOBENZAPRINE HCL 10 MG PO TABS: 10.0000 mg | ORAL_TABLET | Freq: Three times a day (TID) | ORAL | Status: DC | PRN

## 2014-07-15 MED ORDER — KETOROLAC TROMETHAMINE 15 MG/ML IJ SOLN
15.0000 mg | Freq: Once | INTRAMUSCULAR | Status: AC
  Administered 2014-07-15: 15 mg via INTRAVENOUS
  Filled 2014-07-15: qty 1

## 2014-07-15 NOTE — Progress Notes (Signed)
I stopped by the patient's room to check her needs, by Juliann Mule, Interpreter

## 2014-07-15 NOTE — Progress Notes (Signed)
Patient ID: Lorraine [name], female   DOB: June 28, 1986, 28 y.o.   MRN: [id number] FACULTY PRACTICE ANTEPARTUM NOTE  Addelynn Batte is a 28 y.o. [id number] at [redacted]w[redacted]d  who is admitted for right lower quadrant pain.  Fetal presentation is cephalic.  Length of Stay:3  Days  Subjective:  Pt continues to complain of anterior just right of the midline abdominal pain.  Several doses of MSO4 N/V x 3, normal BM  Sonogram reveal clear jets normal preg hydro MRI negative.  Vitals: Blood pressure 111/37, pulse 98, temperature 98.2 F (36.8 C), temperature source Oral, resp. rate 20, height 5' (1.524 m), weight 194 lb 6.4 oz (88.179 kg), last menstrual period 11/27/2013, SpO2 100.00%.  Filed Vitals:   07/14/14 1537 07/14/14 2000 07/15/14 0531 07/15/14 0734  BP: 101/54 121/68 97/54 111/37  Pulse: 81 73 88 98  Temp: 98 F (36.7 C) 98 F (36.7 C) 98.3 F (36.8 C) 98.2 F (36.8 C)  TempSrc: Oral Oral Oral Oral  Resp: 18 18 18 20   Height:      Weight:      SpO2:         Physical Examination:  General appearance - alert, well appearing, and in no distress  Abdomen - soft, tender in the RLQ and radiating to the right in a band like fashion Fundal Height: size equals dates  Pelvic Exam:deferred  Cervical Exam: deferred  Fetal Monitoring: Baseline: 150 bpm, Variability: Good {> 6 bpm), Accelerations: 10x10 and Decelerations: Absent  Labs:  No new labs  Imaging Studies:  MRI:  IMPRESSION:  IMPRESSION:  Prominent right renal pelvis, this is nonspecific. No dilated right  ureter identified. There is no right ureteral jets noted in the  bladder. Consider further evaluation with CT urogram to assess right  collecting system if clinically indicated.  Medications: Scheduled  .  cefTRIAXone (ROCEPHIN) IV  1 g  Intravenous  Q12H   .  docusate sodium  100 mg  Oral  Daily   .  famotidine  20 mg  Oral  BID   .  prenatal multivitamin  1 tablet  Oral  Q1200   I have reviewed the patient's current  medications.  ASSESSMENT:  [redacted]w[redacted]d  RLQ pain, midline radiating to the right  My assessment is most consistent with musculoskeletal pain, given the absence of any other lab or imaging findings and the physical exam findings except for her nausea  Patient Active Problem List    Diagnosis  Date Noted   .  Pyelonephritis  07/12/2014   .  Previous cesarean section complicating pregnancy  71/24/5809   .  Supervision of other normal pregnancy, antepartum  03/02/2014   .  H/O preterm delivery, currently pregnant  03/02/2014   .  Diverticulitis  09/10/2012   .  Abdominal pain  09/09/2012   .  Nausea and vomiting  09/09/2012   .  Dehydration  09/09/2012   .  IUD contraception  09/09/2012   PLAN:  Observation, transition to oral pain meds and see if a k pad will help, if it does suspect MS pain

## 2014-07-15 NOTE — Plan of Care (Signed)
Problem: Consults Goal: Birthing Suites Patient Information Press F2 to bring up selections list  Outcome: Not Applicable Date Met:  94/80/16  Pt < [redacted] weeks EGA

## 2014-07-15 NOTE — Discharge Summary (Signed)
Antenatal Physician Discharge Summary  Patient ID: Lorraine [name] MRN: [id number] DOB/AGE: 28/04/1986 28 y.o.  Admit date: 07/12/2014 Discharge date: 07/15/2014  Admission Diagnoses: abdominal pain in pregnancy  Discharge Diagnoses: same suspect musculoskeletal  Prenatal Procedures: NST and ultrasound  Intrapartum Procedures: same  Consults: general surgery   Significant Diagnostic Studies:  Results for orders placed during the hospital encounter of 07/12/14 (from the past 168 hour(s))  URINE CULTURE   Collection Time    07/12/14  7:39 AM      Result Value Ref Range   Specimen Description URINE, CATHETERIZED     Special Requests NONE     Culture  Setup Time       Value: 07/12/2014 15:37     Performed at [facility]       Value: NO GROWTH     Performed at [facility]   Culture       Value: NO GROWTH     Performed at [facility]   Report Status 07/13/2014 FINAL    URINALYSIS, ROUTINE W REFLEX MICROSCOPIC   Collection Time    07/12/14  7:39 AM      Result Value Ref Range   Color, Urine YELLOW  YELLOW   APPearance CLEAR  CLEAR   Specific Gravity, Urine 1.016  1.005 - 1.030   pH 6.5  5.0 - 8.0   Glucose, UA NEGATIVE  NEGATIVE mg/dL   Hgb urine dipstick NEGATIVE  NEGATIVE   Bilirubin Urine NEGATIVE  NEGATIVE   Ketones, ur NEGATIVE  NEGATIVE mg/dL   Protein, ur NEGATIVE  NEGATIVE mg/dL   Urobilinogen, UA 0.2  0.0 - 1.0 mg/dL   Nitrite NEGATIVE  NEGATIVE   Leukocytes, UA NEGATIVE  NEGATIVE  COMPREHENSIVE METABOLIC PANEL   Collection Time    07/12/14  7:50 AM      Result Value Ref Range   Sodium 137  137 - 147 mEq/L   Potassium 4.0  3.7 - 5.3 mEq/L   Chloride 103  96 - 112 mEq/L   CO2 19  19 - 32 mEq/L   Glucose, Bld 94  70 - 99 mg/dL   BUN 9  6 - 23 mg/dL   Creatinine, Ser 0.58  0.50 - 1.10 mg/dL   Calcium 9.0  8.4 - 10.5 mg/dL   Total Protein 6.9  6.0 - 8.3 g/dL   Albumin 2.7 (*) 3.5 - 5.2 g/dL   AST 17  0 - 37 U/L    ALT 10  0 - 35 U/L   Alkaline Phosphatase 121 (*) 39 - 117 U/L   Total Bilirubin <0.2 (*) 0.3 - 1.2 mg/dL   GFR calc non Af Amer >90  >90 mL/min   GFR calc Af Amer >90  >90 mL/min   Anion gap 15  5 - 15  CBC WITH DIFFERENTIAL   Collection Time    07/12/14  7:50 AM      Result Value Ref Range   WBC 9.1  4.0 - 10.5 K/uL   RBC 3.55 (*) 3.87 - 5.11 MIL/uL   Hemoglobin 10.9 (*) 12.0 - 15.0 g/dL   HCT 32.9 (*) 36.0 - 46.0 %   MCV 92.7  78.0 - 100.0 fL   MCH 30.7  26.0 - 34.0 pg   MCHC 33.1  30.0 - 36.0 g/dL   RDW 12.6  11.5 - 15.5 %   Platelets 238  150 - 400 K/uL   Neutrophils Relative % 69  43 - 77 %   Neutro Abs 6.3  1.7 - 7.7 K/uL   Lymphocytes Relative 25  12 - 46 %   Lymphs Abs 2.3  0.7 - 4.0 K/uL   Monocytes Relative 5  3 - 12 %   Monocytes Absolute 0.4  0.1 - 1.0 K/uL   Eosinophils Relative 1  0 - 5 %   Eosinophils Absolute 0.1  0.0 - 0.7 K/uL   Basophils Relative 0  0 - 1 %   Basophils Absolute 0.0  0.0 - 0.1 K/uL  GC/CHLAMYDIA PROBE AMP   Collection Time    07/12/14  2:45 PM      Result Value Ref Range   CT Probe RNA NEGATIVE  NEGATIVE   GC Probe RNA NEGATIVE  NEGATIVE  WET PREP, GENITAL   Collection Time    07/12/14  2:45 PM      Result Value Ref Range   Yeast Wet Prep HPF POC FEW (*) NONE SEEN   Trich, Wet Prep   (*) NONE SEEN   Value: Multiple bacterial morphotypes present, none predominant. Suggest appropriate recollection if clinically indicated.   Clue Cells Wet Prep HPF POC NONE SEEN  NONE SEEN   WBC, Wet Prep HPF POC FEW (*) NONE SEEN  HIV ANTIBODY (ROUTINE TESTING)   Collection Time    07/12/14  2:47 PM      Result Value Ref Range   HIV 1&2 Ab, 4th Generation NONREACTIVE  NONREACTIVE  LIPASE, BLOOD   Collection Time    07/12/14  2:53 PM      Result Value Ref Range   Lipase 20  11 - 59 U/L  Results for orders placed during the hospital encounter of 07/09/14 (from the past 168 hour(s))  CBC WITH DIFFERENTIAL   Collection Time    07/09/14 10:32 AM       Result Value Ref Range   WBC 8.5  4.0 - 10.5 K/uL   RBC 3.51 (*) 3.87 - 5.11 MIL/uL   Hemoglobin 10.6 (*) 12.0 - 15.0 g/dL   HCT 32.3 (*) 36.0 - 46.0 %   MCV 92.0  78.0 - 100.0 fL   MCH 30.2  26.0 - 34.0 pg   MCHC 32.8  30.0 - 36.0 g/dL   RDW 12.7  11.5 - 15.5 %   Platelets 236  150 - 400 K/uL   Neutrophils Relative % 72  43 - 77 %   Neutro Abs 6.1  1.7 - 7.7 K/uL   Lymphocytes Relative 23  12 - 46 %   Lymphs Abs 1.9  0.7 - 4.0 K/uL   Monocytes Relative 5  3 - 12 %   Monocytes Absolute 0.4  0.1 - 1.0 K/uL   Eosinophils Relative 1  0 - 5 %   Eosinophils Absolute 0.1  0.0 - 0.7 K/uL   Basophils Relative 0  0 - 1 %   Basophils Absolute 0.0  0.0 - 0.1 K/uL  COMPREHENSIVE METABOLIC PANEL   Collection Time    07/09/14 10:32 AM      Result Value Ref Range   Sodium 140  137 - 147 mEq/L   Potassium 3.9  3.7 - 5.3 mEq/L   Chloride 105  96 - 112 mEq/L   CO2 21  19 - 32 mEq/L   Glucose, Bld 94  70 - 99 mg/dL   BUN 8  6 - 23 mg/dL   Creatinine, Ser 0.56  0.50 - 1.10 mg/dL   Calcium 8.8  8.4 - 10.5 mg/dL   Total Protein 6.7  6.0 - 8.3 g/dL   Albumin 2.7 (*) 3.5 - 5.2 g/dL   AST 20  0 - 37 U/L   ALT 11  0 - 35 U/L   Alkaline Phosphatase 123 (*) 39 - 117 U/L   Total Bilirubin <0.2 (*) 0.3 - 1.2 mg/dL   GFR calc non Af Amer >90  >90 mL/min   GFR calc Af Amer >90  >90 mL/min   Anion gap 14  5 - 15  URINE CULTURE   Collection Time    07/09/14 11:30 AM      Result Value Ref Range   Specimen Description URINE, CLEAN CATCH     Special Requests Normal     Culture  Setup Time       Value: 07/09/2014 15:10     Performed at [facility]       Value: >=100,000 COLONIES/ML     Performed at [facility]   Culture       Value: Multiple bacterial morphotypes present, none predominant. Suggest appropriate recollection if clinically indicated.     Performed at [facility]   Report Status 07/10/2014 FINAL    URINALYSIS, ROUTINE W REFLEX MICROSCOPIC    Collection Time    07/09/14 11:30 AM      Result Value Ref Range   Color, Urine YELLOW  YELLOW   APPearance CLEAR  CLEAR   Specific Gravity, Urine 1.020  1.005 - 1.030   pH 6.0  5.0 - 8.0   Glucose, UA NEGATIVE  NEGATIVE mg/dL   Hgb urine dipstick NEGATIVE  NEGATIVE   Bilirubin Urine NEGATIVE  NEGATIVE   Ketones, ur 15 (*) NEGATIVE mg/dL   Protein, ur NEGATIVE  NEGATIVE mg/dL   Urobilinogen, UA 0.2  0.0 - 1.0 mg/dL   Nitrite NEGATIVE  NEGATIVE   Leukocytes, UA NEGATIVE  NEGATIVE  FETAL FIBRONECTIN   Collection Time    07/09/14 12:14 PM      Result Value Ref Range   Fetal Fibronectin NEGATIVE  NEGATIVE    Treatments: pain medications  Hospital Course:  This is a 28 y.o. [id number] with IUP at [redacted]w[redacted]d admitted for abdominal/pelvic pain on her right side thought to be musculoskeletal in origin.  She is improved from admission but, still has pain. She was ruled out for pyelonephritis.  She was evaluated for nephrolithiasis but, it seemed less likely given normal sono of the kidneys and only pregnancy related hydronephrosis.  Pt had no fetal issues while in the hospital. Fetal HR was Cat 1.  She had no ctx.  No leaking of fluid and no bleeding.  Today she was transitioned to po pain meds and a heating pain made the pain somewhat better.  She had N/V earlier that resolved and she was able to tolerate food with no difficulty.  She was observed, fetal heart rate monitoring remained reassuring, and she had no signs/symptoms of progressing pain or other maternal-fetal concerns.  She had a general surgery consult and was not thought to be a surgical candidate. She was deemed stable for discharge to home with outpatient follow up. Of note, she reported that she had a similar pain in her prior pregnancy that was relieved after a cholecystectomy but, it was higher in the abdomen.  She did not have any objections to discharge. (entire history and exam done with the assistance of a Spanish  interpreter)  Discharge Exam: BP  111/59  Pulse 78  Temp(Src) 98 F (36.7 C) (Oral)  Resp 20  Ht 5' (1.524 m)  Wt 194 lb 6.4 oz (88.179 kg)  BMI 37.97 kg/m2  SpO2 100%  LMP 11/27/2013 General appearance: alert and no distress GI: gravid; soft, No rebound or guarding.  Tenderness to palpation over abd wall on the right lower quad.  CBC    Component Value Date/Time   WBC 9.1 07/12/2014 0750   RBC 3.55* 07/12/2014 0750   HGB 10.9* 07/12/2014 0750   HCT 32.9* 07/12/2014 0750   PLT 238 07/12/2014 0750   MCV 92.7 07/12/2014 0750   MCH 30.7 07/12/2014 0750   MCHC 33.1 07/12/2014 0750   RDW 12.6 07/12/2014 0750   LYMPHSABS 2.3 07/12/2014 0750   MONOABS 0.4 07/12/2014 0750   EOSABS 0.1 07/12/2014 0750   BASOSABS 0.0 07/12/2014 0750     Discharge Condition: good  Disposition: 01-Home or Self Care  Discharge Instructions   Discharge activity:  No Restrictions    Complete by:  As directed      Discharge diet:  No restrictions    Complete by:  As directed      Fetal Kick Count:  Lie on our left side for one hour after a meal, and count the number of times your baby kicks.  If it is less than 5 times, get up, move around and drink some juice.  Repeat the test 30 minutes later.  If it is still less than 5 kicks in an hour, notify your doctor.    Complete by:  As directed      No sexual activity restrictions    Complete by:  As directed             Medication List         acetaminophen 325 MG tablet  Commonly known as:  TYLENOL  Take 325 mg by mouth every 6 (six) hours as needed for moderate pain.     cyclobenzaprine 10 MG tablet  Commonly known as:  FLEXERIL  Take 1 tablet (10 mg total) by mouth 3 (three) times daily as needed for muscle spasms.     famotidine 20 MG tablet  Commonly known as:  PEPCID  Take 20 mg by mouth 2 (two) times daily.     ibuprofen 600 MG tablet  Commonly known as:  ADVIL,MOTRIN  Take 1 tablet (600 mg total) by mouth every 6 (six) hours as  needed. For 3 days ONLY with food.     multivitamin-prenatal 27-0.8 MG Tabs tablet  Take 1 tablet by mouth daily at 12 noon.     oxyCODONE-acetaminophen 5-325 MG per tablet  Commonly known as:  PERCOCET/ROXICET  Take 1-2 tablets by mouth every 4 (four) hours as needed for moderate pain.           Follow-up Information   Follow up with [facility] In 1 week.      SignedLavonia Drafts M.D. 07/15/2014, 3:25 PM

## 2014-07-15 NOTE — Discharge Instructions (Signed)
Dolor abdominal en el embarazo (Abdominal Pain During Pregnancy) El dolor abdominal es frecuente durante el embarazo. Generalmente no causa ningn dao. El dolor abdominal puede tener numerosas causas. Algunas causas son ms graves que otras. Ciertas causas de dolor abdominal durante el embarazo se diagnostican fcilmente. A veces, se tarda un tiempo para llegar al diagnstico. Otras veces la causa no se conoce. El dolor abdominal puede estar relacionado con [place] alteracin del [place], o puede deberse a una causa totalmente diferente. Por este motivo, siempre consulte a su mdico cuando sienta molestias abdominales. INSTRUCCIONES PARA EL CUIDADO EN EL HOGAR  Est atenta al dolor para ver si hay cambios. Las siguientes indicaciones ayudarn a [employer] pueda sentir:  No Consulting civil engineer sexuales y no coloque nada dentro de la vagina hasta que los sntomas hayan desaparecido completamente.  Descanse todo lo que pueda [employer] dolor se le haya calmado.  Si siente nuseas, beba lquidos claros. Evite los alimentos slidos mientras sienta malestar o tenga nuseas.  Tome slo medicamentos de venta libre o recetados, segn las indicaciones del mdico.  Cumpla con todas las visitas de control, segn le indique su mdico. SOLICITE ATENCIN MDICA DE INMEDIATO SI:  Tiene un sangrado, prdida de lquidos o elimina tejidos por la vagina.  El dolor o los clicos [place].  Tiene vmitos persistentes.  Comienza a Education officer, environmental al orinar u [employer].  Tiene fiebre.  Nota que los movimientos del beb disminuyen.  Siente intensa debilidad o se marea.  Tiene dificultad para respirar con o sin dolor abdominal.  Siente un dolor de cabeza intenso junto al dolor abdominal.  Wilma Flavin secrecin vaginal anormal con dolor abdominal.  Tiene diarrea persistente.  El dolor abdominal sigue o empeora an despus de Magazine features editor. ASEGRESE DE QUE:   Comprende estas  instrucciones.  Controlar su afeccin.  Recibir ayuda de inmediato si no mejora o si empeora. Document Released: 09/03/2005 Document Revised: 06/24/2013 [facility] Patient Information 2015 [place], [place]. This information is not intended to replace advice given to you by your health care provider. Make sure you discuss any questions you have with your health care provider. Evaluacin de los movimientos fetales  (Fetal Movement Counts) Nombre del paciente: __________________________________________________ Karalee Height estimada: ____________________ Allyne Gee de los movimientos fetales es muy recomendable en los embarazos de alto riesgo, pero tambin es una buena idea que lo hagan todas las [place]. El Buyer, retail que comience a contarlos a las 28 semanas de [place]. Los movimientos fetales suelen aumentar:   Despus de Music therapist.  Despus de la actividad fsica.  Despus de comer o beber [employer] o fro.  En reposo. Preste atencin cuando sienta que el beb est ms activo. Esto le ayudar a notar un patrn de ciclos de vigilia y sueo de su beb y cules son los factores que contribuyen a un aumento de los movimientos fetales. Es importante llevar a cabo un recuento de movimientos fetales, al mismo tiempo cada da, cuando el beb normalmente est ms activo.  [place] 1. Busque un lugar tranquilo y cmodo para sentarse o recostarse sobre el lado izquierdo. Al recostarse sobre su lado izquierdo, le proporciona una mejor circulacin de [place] y oxgeno al beb. 2. Anote el da y la hora en una hoja de papel o en un diario. 3. Comience contando las pataditas, revoloteos, chasquidos, vueltas o pinchazos en un perodo de 2 horas. Debe sentir al menos 10 movimientos en 2  horas. 4. Si no siente 10 movimientos en 2 horas, espere 2  3 horas y cuente de nuevo. Busque cambios en el patrn o si no cuenta lo suficiente en 2 horas. SOLICITE  ATENCIN MDICA SI:   Siente menos de 10 pataditas en 2 horas, en dos intentos.  No hay movimientos durante una hora.  El patrn se modifica o le lleva ms [place] contar las 10 pataditas.  Siente que el beb no se mueve como lo hace habitualmente. Fecha: ____________ Movimientos: ____________ Eda Paschal inicio: ____________ Eda Paschal finalizacin: ____________  Toma Copier: ____________ Movimientos: ____________ Eda Paschal inicio: ____________ Eda Paschal finalizacin: ____________  Toma Copier: ____________ Movimientos: ____________ Eda Paschal inicio: ____________ Eda Paschal finalizacin: ____________  Toma Copier: ____________ Movimientos: ____________ Eda Paschal inicio: ____________ Eda Paschal finalizacin: ____________  Toma Copier: ____________ Movimientos: ____________ Eda Paschal inicio: ____________ Eda Paschal finalizacin: ____________  Toma Copier: ____________ Movimientos: ____________ Mellody Drown de inicio: ____________ Mellody Drown de finalizacin: ____________  Toma Copier: ____________ Movimientos: ____________ Mellody Drown de inicio: ____________ Mellody Drown de finalizacin: ____________  Toma Copier: ____________ Movimientos: ____________ Mellody Drown de inicio: ____________ Mellody Drown de finalizacin: ____________  Toma Copier: ____________ Movimientos: ____________ Mellody Drown de inicio: ____________ Mellody Drown de finalizacin: ____________  Toma Copier: ____________ Movimientos: ____________ Mellody Drown de inicio: ____________ Mellody Drown de finalizacin: ____________  Toma Copier: ____________ Movimientos: ____________ Mellody Drown de inicio: ____________ Mellody Drown de finalizacin: ____________  Toma Copier: ____________ Movimientos: ____________ Mellody Drown de inicio: ____________ Mellody Drown de finalizacin: ____________  Toma Copier: ____________ Movimientos: ____________ Mellody Drown de inicio: ____________ Mellody Drown de finalizacin: ____________  Toma Copier: ____________ Movimientos: ____________ Mellody Drown de inicio: ____________ Mellody Drown de finalizacin: ____________  Toma Copier: ____________ Movimientos: ____________ Mellody Drown de inicio: ____________ Mellody Drown de finalizacin: ____________    Toma Copier: ____________ Movimientos: ____________ Mellody Drown de inicio: ____________ Mellody Drown de finalizacin: ____________  Toma Copier: ____________ Movimientos: ____________ Mellody Drown de inicio: ____________ Mellody Drown de finalizacin: ____________  Toma Copier: ____________ Movimientos: ____________ Mellody Drown de inicio: ____________ Mellody Drown de finalizacin: ____________  Toma Copier: ____________ Movimientos: ____________ Mellody Drown de inicio: ____________ Mellody Drown de finalizacin: ____________  Toma Copier: ____________ Movimientos: ____________ Mellody Drown de inicio: ____________ Mellody Drown de finalizacin: ____________  Toma Copier: ____________ Movimientos: ____________ Mellody Drown de inicio: ____________ Mellody Drown de finalizacin: ____________  Toma Copier: ____________ Movimientos: ____________ Mellody Drown de inicio: ____________ Mellody Drown de finalizacin: ____________  Toma Copier: ____________ Movimientos: ____________ Mellody Drown de inicio: ____________ Mellody Drown de finalizacin: ____________  Toma Copier: ____________ Movimientos: ____________ Mellody Drown de inicio: ____________ Mellody Drown de finalizacin: ____________  Toma Copier: ____________ Movimientos: ____________ Mellody Drown de inicio: ____________ Mellody Drown de finalizacin: ____________  Toma Copier: ____________ Movimientos: ____________ Mellody Drown de inicio: ____________ Mellody Drown de finalizacin: ____________  Toma Copier: ____________ Movimientos: ____________ Mellody Drown de inicio: ____________ Mellody Drown de finalizacin: ____________  Toma Copier: ____________ Movimientos: ____________ Mellody Drown de inicio: ____________ Mellody Drown de finalizacin: ____________  Toma Copier: ____________ Movimientos: ____________ Mellody Drown de inicio: ____________ Mellody Drown de finalizacin: ____________  Toma Copier: ____________ Movimientos: ____________ Mellody Drown de inicio: ____________ Mellody Drown de finalizacin: ____________  Toma Copier: ____________ Movimientos: ____________ Mellody Drown de inicio: ____________ Mellody Drown de finalizacin: ____________  Toma Copier: ____________ Movimientos: ____________ Mellody Drown de inicio: ____________ Mellody Drown de finalizacin: ____________  Toma Copier: ____________ Movimientos:  ____________ Mellody Drown de inicio: ____________ Mellody Drown de finalizacin: ____________  Toma Copier: ____________ Movimientos: ____________ Mellody Drown de inicio: ____________ Mellody Drown de finalizacin: ____________  Toma Copier: ____________ Movimientos: ____________ Mellody Drown de inicio: ____________ Mellody Drown de finalizacin: ____________  Toma Copier: ____________ Movimientos: ____________ Mellody Drown de inicio: ____________ Mellody Drown de finalizacin: ____________  Toma Copier: ____________ Movimientos: ____________ Mellody Drown de inicio: ____________ Mellody Drown de finalizacin: ____________  Toma Copier: ____________ Movimientos: ____________ Mellody Drown de inicio: ____________ Mellody Drown de finalizacin: ____________  Toma Copier: ____________ Movimientos: ____________ Mellody Drown de inicio: ____________ Mellody Drown de finalizacin: ____________  Toma Copier: ____________ Movimientos: ____________  [place]ora de inicio: ____________ Mellody Drown de finalizacin: ____________  Toma Copier: ____________ Movimientos: ____________ Eda Paschal inicio: ____________ Eda Paschal finalizacin: ____________  Toma Copier: ____________ Movimientos: ____________ Eda Paschal inicio: ____________ Eda Paschal finalizacin: ____________  Toma Copier: ____________ Movimientos: ____________ Eda Paschal inicio: ____________ Eda Paschal finalizacin: ____________  Toma Copier: ____________ Movimientos: ____________ Eda Paschal inicio: ____________ Eda Paschal finalizacin: ____________  Toma Copier: ____________ Movimientos: ____________ Eda Paschal inicio: ____________ Mellody Drown de finalizacin: ____________  Toma Copier: ____________ Movimientos: ____________ Mellody Drown de inicio: ____________ Mellody Drown de finalizacin: ____________  Toma Copier: ____________ Movimientos: ____________ Mellody Drown de inicio: ____________ Mellody Drown de finalizacin: ____________  Toma Copier: ____________ Movimientos: ____________ Mellody Drown de inicio: ____________ Mellody Drown de finalizacin: ____________  Toma Copier: ____________ Movimientos: ____________ Mellody Drown de inicio: ____________ Mellody Drown de finalizacin: ____________  Toma Copier: ____________ Movimientos: ____________ Mellody Drown de inicio: ____________  Mellody Drown de finalizacin: ____________  Toma Copier: ____________ Movimientos: ____________ Eda Paschal inicio: ____________ Mellody Drown de finalizacin: ____________  Toma Copier: ____________ Movimientos: ____________ Eda Paschal inicio: ____________ Mellody Drown de finalizacin: ____________  Toma Copier: ____________ Movimientos: ____________ Eda Paschal inicio: ____________ Mellody Drown de finalizacin: ____________  Toma Copier: ____________ Movimientos: ____________ Eda Paschal inicio: ____________ Eda Paschal finalizacin: ____________  Toma Copier: ____________ Movimientos: ____________ Eda Paschal inicio: ____________ Eda Paschal finalizacin: ____________  Toma Copier: ____________ Movimientos: ____________ Eda Paschal inicio: ____________ Mellody Drown de finalizacin: ____________  Document Released: 12/11/2007 Document Revised: 08/20/2012 [employer] Patient Information 2015 [place], [place]. This information is not intended to replace advice given to you by your health care provider. Make sure you discuss any questions you have with your health care provider.

## 2014-07-17 ENCOUNTER — Inpatient Hospital Stay (HOSPITAL_COMMUNITY)
Admission: AD | Admit: 2014-07-17 | Discharge: 2014-07-17 | Disposition: A | Discharge status: Home / Self Care | Payer: Self-pay | Source: Ambulatory Visit | Attending: Obstetrics and Gynecology | Admitting: Obstetrics and Gynecology

## 2014-07-17 ENCOUNTER — Encounter (HOSPITAL_COMMUNITY): Payer: Self-pay

## 2014-07-17 DIAGNOSIS — R109 Unspecified abdominal pain: Secondary | ICD-10-CM | POA: Insufficient documentation

## 2014-07-17 DIAGNOSIS — O26893 Other specified pregnancy related conditions, third trimester: Secondary | ICD-10-CM | POA: Insufficient documentation

## 2014-07-17 DIAGNOSIS — O26899 Other specified pregnancy related conditions, unspecified trimester: Secondary | ICD-10-CM

## 2014-07-17 DIAGNOSIS — O9989 Other specified diseases and conditions complicating pregnancy, childbirth and the puerperium: Secondary | ICD-10-CM

## 2014-07-17 DIAGNOSIS — Z3A29 29 weeks gestation of pregnancy: Secondary | ICD-10-CM | POA: Insufficient documentation

## 2014-07-17 LAB — URINALYSIS, ROUTINE W REFLEX MICROSCOPIC
Bilirubin Urine: NEGATIVE
Glucose, UA: NEGATIVE mg/dL
Hgb urine dipstick: NEGATIVE
Ketones, ur: NEGATIVE mg/dL
LEUKOCYTES UA: NEGATIVE
NITRITE: NEGATIVE
Protein, ur: NEGATIVE mg/dL
Specific Gravity, Urine: 1.02 (ref 1.005–1.030)
Urobilinogen, UA: 0.2 mg/dL (ref 0.0–1.0)
pH: 6.5 (ref 5.0–8.0)

## 2014-07-17 LAB — COMPREHENSIVE METABOLIC PANEL
ALK PHOS: 126 U/L — AB (ref 39–117)
ALT: 11 U/L (ref 0–35)
AST: 16 U/L (ref 0–37)
Albumin: 2.8 g/dL — ABNORMAL LOW (ref 3.5–5.2)
Anion gap: 11 (ref 5–15)
BUN: 6 mg/dL (ref 6–23)
CO2: 23 meq/L (ref 19–32)
Calcium: 9.3 mg/dL (ref 8.4–10.5)
Chloride: 101 mEq/L (ref 96–112)
Creatinine, Ser: 0.58 mg/dL (ref 0.50–1.10)
GFR calc non Af Amer: >90 mL/min (ref >90)
GLUCOSE: 93 mg/dL (ref 70–99)
POTASSIUM: 4.3 meq/L (ref 3.7–5.3)
Sodium: 135 mEq/L — ABNORMAL LOW (ref 137–147)
Total Bilirubin: <0.2 mg/dL — ABNORMAL LOW (ref 0.3–1.2)
Total Protein: 6.8 g/dL (ref 6.0–8.3)

## 2014-07-17 LAB — CBC
HCT: 33.9 % — ABNORMAL LOW (ref 36.0–46.0)
HEMOGLOBIN: 11.2 g/dL — AB (ref 12.0–15.0)
MCH: 30.2 pg (ref 26.0–34.0)
MCHC: 33 g/dL (ref 30.0–36.0)
MCV: 91.4 fL (ref 78.0–100.0)
Platelets: 256 10*3/uL (ref 150–400)
RBC: 3.71 MIL/uL — ABNORMAL LOW (ref 3.87–5.11)
RDW: 12.7 % (ref 11.5–15.5)
WBC: 8.7 10*3/uL (ref 4.0–10.5)

## 2014-07-17 MED ORDER — HYDROMORPHONE HCL 2 MG/ML IJ SOLN
2.0000 mg | Freq: Once | INTRAMUSCULAR | Status: AC
  Administered 2014-07-17: 2 mg via INTRAMUSCULAR
  Filled 2014-07-17: qty 1

## 2014-07-17 MED ORDER — HYDROMORPHONE HCL 2 MG PO TABS: 2.0000 mg | ORAL_TABLET | Freq: Four times a day (QID) | ORAL | Status: DC | PRN

## 2014-07-17 MED ORDER — OXYCODONE-ACETAMINOPHEN 5-325 MG PO TABS: 1.0000 | ORAL_TABLET | Freq: Once | ORAL | Status: DC

## 2014-07-17 NOTE — MAU Provider Note (Signed)
History     CSN: [id number]  Arrival date and time: 07/17/14 1311   First Provider Initiated Contact with Patient 07/17/14 1358      Chief Complaint  Patient presents with  . Abdominal Pain   HPI  Pt is a 28 yo G5P1122 at [redacted]w[redacted]d wks IUP here with report of continued right sided abdominal pain. Pain is described as a  "pinching pain".  Pain is rated a 7/10.   Pain increases with fetal movement.  +nausea and vomiting x 3.  No UTI symptoms.  Last bowel movement this am.  Denies vaginal bleeding, contractions, or leaking of fluid.     Pt was admitted and discharged home on 07/15/14 for abdominal/pelvic pain on her right side thought to be musculoskeletal in origin.  She was ruled out for pyelonephritis. She was evaluated for nephrolithiasis but, it seemed less likely given normal sono of the kidneys and only pregnancy related hydronephrosis. MR negative for appendicitis.  Pt had no fetal issues while in the hospital. Fetal HR was Cat 1. She had no ctx. No leaking of fluid and no bleeding. She transitioned to po pain meds and a heating pad made the pain somewhat better. She had N/V earlier that resolved and she was able to tolerate food with no difficulty. She was observed, fetal heart rate monitoring remained reassuring, and she had no signs/symptoms of progressing pain or other maternal-fetal concerns. She had a general surgery consult and was not thought to be a surgical candidate. She was deemed stable for discharge to home with outpatient follow up. Of note, she reported that she had a similar pain in her prior pregnancy that was relieved after a cholecystectomy but, it was higher in the abdomen. She did not have any objections to discharge.   Past Medical History  Diagnosis Date  . Diverticulitis     Past Surgical History  Procedure Laterality Date  . Cholecystectomy    . Cesarean section      Family History  Problem Relation Age of Onset  . Hyperlipidemia Father   . Diabetes  Father     History  Substance Use Topics  . Smoking status: Never Smoker   . Smokeless tobacco: Never Used  . Alcohol Use: No    Allergies: No Known Allergies  Prescriptions prior to admission  Medication Sig Dispense Refill  . cyclobenzaprine (FLEXERIL) 10 MG tablet Take 1 tablet (10 mg total) by mouth 3 (three) times daily as needed for muscle spasms.  30 tablet  0  . oxyCODONE-acetaminophen (PERCOCET/ROXICET) 5-325 MG per tablet Take 1-2 tablets by mouth every 4 (four) hours as needed for moderate pain.  30 tablet  0  . Prenatal Vit-Fe Fumarate-FA (MULTIVITAMIN-PRENATAL) 27-0.8 MG TABS tablet Take 1 tablet by mouth daily at 12 noon.        Review of Systems  Constitutional: Positive for chills. Negative for fever.  HENT: Positive for sore throat.   Respiratory: Negative for cough.   Gastrointestinal: Positive for nausea, vomiting (x2 in 24 hours) and abdominal pain. Negative for diarrhea and constipation.  Genitourinary: Negative for dysuria, urgency, frequency and hematuria.  Musculoskeletal:       Bilat hip pain   Physical Exam   Height 5' (1.524 m), weight 88.678 kg (195 lb 8 oz), last menstrual period 11/27/2013. Marland Kitchen Physical Exam  Constitutional: She is oriented to person, place, and time. She appears well-developed and well-nourished.  Appears uncomfortable  HENT:  Head: Normocephalic.  Neck: Normal range of motion.  Neck supple.  Cardiovascular: Normal rate, regular rhythm and normal heart sounds.   Respiratory: Effort normal and breath sounds normal.  GI: Soft. There is tenderness (RLQ) in the right lower quadrant. There is no rigidity, no rebound and no guarding.  Genitourinary: No bleeding around the vagina. Vaginal discharge (mucusy) found.  Neurological: She is alert and oriented to person, place, and time.  Skin: Skin is warm and dry.   Dilation: Closed Effacement (%): Thick Cervical Position: Posterior Exam by:: Reina Fuse, CNM   FHR 150's, +accels T[place]  - irregular  MAU Course  Procedures  Results for orders placed during the hospital encounter of 07/17/14 (from the past 24 hour(s))  URINALYSIS, ROUTINE W REFLEX MICROSCOPIC     Status: None   Collection Time    07/17/14  1:21 PM      Result Value Ref Range   Color, Urine YELLOW  YELLOW   APPearance CLEAR  CLEAR   Specific Gravity, Urine 1.020  1.005 - 1.030   pH 6.5  5.0 - 8.0   Glucose, UA NEGATIVE  NEGATIVE mg/dL   Hgb urine dipstick NEGATIVE  NEGATIVE   Bilirubin Urine NEGATIVE  NEGATIVE   Ketones, ur NEGATIVE  NEGATIVE mg/dL   Protein, ur NEGATIVE  NEGATIVE mg/dL   Urobilinogen, UA 0.2  0.0 - 1.0 mg/dL   Nitrite NEGATIVE  NEGATIVE   Leukocytes, UA NEGATIVE  NEGATIVE  CBC     Status: Abnormal   Collection Time    07/17/14  2:36 PM      Result Value Ref Range   WBC 8.7  4.0 - 10.5 K/uL   RBC 3.71 (*) 3.87 - 5.11 MIL/uL   Hemoglobin 11.2 (*) 12.0 - 15.0 g/dL   HCT 33.9 (*) 36.0 - 46.0 %   MCV 91.4  78.0 - 100.0 fL   MCH 30.2  26.0 - 34.0 pg   MCHC 33.0  30.0 - 36.0 g/dL   RDW 12.7  11.5 - 15.5 %   Platelets 256  150 - 400 K/uL  COMPREHENSIVE METABOLIC PANEL     Status: Abnormal   Collection Time    07/17/14  2:36 PM      Result Value Ref Range   Sodium 135 (*) 137 - 147 mEq/L   Potassium 4.3  3.7 - 5.3 mEq/L   Chloride 101  96 - 112 mEq/L   CO2 23  19 - 32 mEq/L   Glucose, Bld 93  70 - 99 mg/dL   BUN 6  6 - 23 mg/dL   Creatinine, Ser 0.58  0.50 - 1.10 mg/dL   Calcium 9.3  8.4 - 10.5 mg/dL   Total Protein 6.8  6.0 - 8.3 g/dL   Albumin 2.8 (*) 3.5 - 5.2 g/dL   AST 16  0 - 37 U/L   ALT 11  0 - 35 U/L   Alkaline Phosphatase 126 (*) 39 - 117 U/L   Total Bilirubin <0.2 (*) 0.3 - 1.2 mg/dL   GFR calc non Af Amer >90  >90 mL/min   GFR calc Af Amer >90  >90 mL/min   Anion gap 11  5 - 15    1700 Consulted with Dr. Glo Herring > reviewed HPI/prior admission/exam/labs/OB hx > Dr. Carmell Austria reviewed fetal monitoring strips, prior admission note, current results and pain level   > discharge to home with follow-up in office  1705 Pt reports decrease in pain with 2 mg dilaudid IM, reported 4/10  Assessment and Plan  28  yo [id number] at [redacted]w[redacted]d wks IUP Abdominal Pain in Pregnancy - normal exam/labs Category I FHR Tracing  Plan: Discharge to home RX Dilaudid 2 mg po #15 Follow-up in office next week Reviewed pregnancy precautions  Sharyon [facility] N 07/17/2014, 2:01 PM

## 2014-07-17 NOTE — Discharge Instructions (Signed)
Dolor abdominal en el embarazo °(Abdominal Pain During Pregnancy) °El dolor abdominal es frecuente durante el embarazo. Generalmente no causa ningún daño. El dolor abdominal puede tener numerosas causas. Algunas causas son más graves que otras. Ciertas causas de dolor abdominal durante el embarazo se diagnostican fácilmente. A veces, se tarda un tiempo para llegar al diagnóstico. Otras veces la causa no se conoce. El dolor abdominal puede estar relacionado con alguna alteración del embarazo, o puede deberse a una causa totalmente diferente. Por este motivo, siempre consulte a su médico cuando sienta molestias abdominales. °INSTRUCCIONES PARA EL CUIDADO EN EL HOGAR  °Esté atenta al dolor para ver si hay cambios. Las siguientes indicaciones ayudarán a aliviar cualquier molestia que pueda sentir: °· No tenga relaciones sexuales y no coloque nada dentro de la vagina hasta que los síntomas hayan desaparecido completamente. °· Descanse todo lo que pueda hasta que el dolor se le haya calmado. °· Si siente náuseas, beba líquidos claros. Evite los alimentos sólidos mientras sienta malestar o tenga náuseas. °· Tome sólo medicamentos de venta libre o recetados, según las indicaciones del médico. °· Cumpla con todas las visitas de control, según le indique su médico. °SOLICITE ATENCIÓN MÉDICA DE INMEDIATO SI: °· Tiene un sangrado, pérdida de líquidos o elimina tejidos por la vagina. °· El dolor o los cólicos aumentan. °· Tiene vómitos persistentes. °· Comienza a sentir dolor al orinar u observa sangre. °· Tiene fiebre. °· Nota que los movimientos del bebé disminuyen. °· Siente intensa debilidad o se marea. °· Tiene dificultad para respirar con o sin dolor abdominal. °· Siente un dolor de cabeza intenso junto al dolor abdominal. °· Tiene una secreción vaginal anormal con dolor abdominal. °· Tiene diarrea persistente. °· El dolor abdominal sigue o empeora aún después de hacer reposo. °ASEGÚRESE DE QUE:  °· Comprende estas  instrucciones. °· Controlará su afección. °· Recibirá ayuda de inmediato si no mejora o si empeora. °Document Released: 09/03/2005 Document Revised: 06/24/2013 °ExitCare® Patient Information ©2015 [employer]. This information is not intended to replace advice given to you by your health care provider. Make sure you discuss any questions you have with your health care provider. ° °

## 2014-07-17 NOTE — Progress Notes (Signed)
Will come for bedside triage/eval.

## 2014-07-17 NOTE — MAU Note (Signed)
Pt presents to MAU with complaints of pain in her right lower side, she was evaluated on Monday for same pain. Denies any vaginal bleeding or LOF.

## 2014-07-18 NOTE — Progress Notes (Signed)
Assisted RN with interpretation of discharge instructions.  [place]

## 2014-07-18 NOTE — Progress Notes (Signed)
Assisted RN with interpretation of assessment. [place]

## 2014-07-19 ENCOUNTER — Ambulatory Visit (INDEPENDENT_AMBULATORY_CARE_PROVIDER_SITE_OTHER): Payer: Self-pay | Admitting: Family Medicine

## 2014-07-19 ENCOUNTER — Encounter (HOSPITAL_COMMUNITY): Payer: Self-pay

## 2014-07-19 VITALS — BP 114/65 | HR 98 | Temp 98.0°F | Wt 192.9 lb

## 2014-07-19 DIAGNOSIS — Z23 Encounter for immunization: Secondary | ICD-10-CM

## 2014-07-19 DIAGNOSIS — Z3483 Encounter for supervision of other normal pregnancy, third trimester: Secondary | ICD-10-CM

## 2014-07-19 LAB — POCT URINALYSIS DIP (DEVICE)
Bilirubin Urine: NEGATIVE
Glucose, UA: NEGATIVE mg/dL
Hgb urine dipstick: NEGATIVE
Ketones, ur: NEGATIVE mg/dL
LEUKOCYTES UA: NEGATIVE
Nitrite: NEGATIVE
PH: 6 (ref 5.0–8.0)
PROTEIN: NEGATIVE mg/dL
Specific Gravity, Urine: 1.02 (ref 1.005–1.030)
Urobilinogen, UA: 0.2 mg/dL (ref 0.0–1.0)

## 2014-07-19 NOTE — Patient Instructions (Signed)
Dolor abdominal durante el embarazo (Abdominal Pain During Pregnancy) El dolor de vientre (abdominal) es habitual durante el embarazo. Generalmente no se trata de un problema grave. Otras veces puede ser un signo de que algo no anda bien. Siempre comunquese con su mdico si tiene dolor abdominal. CUIDADOS EN EL HOGAR Controle el dolor para ver si hay cambios. Las indicaciones que siguen pueden ayudarla a sentirse mejor:  Optician, dispensing (relaciones sexuales) ni se coloque nada dentro de la vagina hasta que se sienta mejor.  Haga reposo hasta que el dolor se calme.  Si siente ganas de vomitar (nuseas ) beba lquidos claros. No consuma alimentos slidos hasta que se sienta mejor.  Slo tome los medicamentos que le haya indicado su mdico.  Cumpla con las visitas al mdico segn las indicaciones. SOLICITE AYUDA DE INMEDIATO SI:   Tiene un sangrado, pierde lquido o elimina trozos de tejido por la vagina.  Siente ms dolor o clicos.  Comienza a vomitar.  Siente dolor al orinar u observa sangre en la orina.  Tiene fiebre.  No siente que el beb se mueva mucho.  Se siente muy dbil o cree que va a desmayarse.  Tiene dificultad para respirar con o sin dolor en el vientre.  Siente un dolor de cabeza muy intenso y Social research officer, government en el vientre.  Observa que sale un lquido por la vagina y tiene dolor abdominal.  La materia fecal es lquida (diarrea).  El dolor en el viente no desaparece, o empeora, luego de hacer reposo. ASEGRESE DE QUE:   Comprende estas instrucciones.  Controlar su afeccin.  Recibir ayuda de inmediato si no mejora o si empeora. Document Released: 05/16/2011 Document Revised: 05/06/2013 [facility] Patient Information 2015 [place]. This information is not intended to replace advice given to you by your health care provider. Make sure you discuss any questions you have with your health care provider.

## 2014-07-19 NOTE — Progress Notes (Signed)
Spanish Interpreter Cristi Roughley Flu vaccine

## 2014-07-19 NOTE — Progress Notes (Signed)
Patient is 28 y.o. [id number] [redacted]w[redacted]d.  +FM, denies LOF, VB, vaginal discharge.  +contractions: up to 4-5x in 1 hour, usually worse at night. RLQ pain: evaluated in MAU, rx'd dilaudid 2mg  PO #15, still has and occasionally takes but reports still feels some discomfort.  Has some nausea but usually associated with pain medication, otherwise tolerating PO well. - pain is constant, worse with "hard stomach" and movement, some pain relief when she showers.  Pain sometimes disturbs sleep => unclear what pain related to, advise warm compresses and no pain med refills at this time.  Pt agreeable with plan.

## 2014-07-21 ENCOUNTER — Encounter: Payer: Self-pay | Admitting: Obstetrics & Gynecology

## 2014-07-28 ENCOUNTER — Telehealth: Payer: Self-pay | Admitting: *Deleted

## 2014-07-28 NOTE — Telephone Encounter (Addendum)
Pt left message in Spanish. Her call will need to be returned with an interpreter.   Called pt back with [facility] # [phone] @ [phone] and discussed her concerns. She stated that she has been having some white vaginal discharge and pelvic pain when walking sometimes. She wants to know if these things are normal. She denies vaginal irritation, bleeding or odor. She also denies regular UC's. I advised pt that she is experiencing normal changes as her pregnancy progresses. She was advised to come to MAU if she develops heavy bleeding or regular UC's which are every 5 min for more than an hour. Pt voiced understanding and had no additional questions.

## 2014-07-31 ENCOUNTER — Inpatient Hospital Stay (HOSPITAL_COMMUNITY)
Admission: AD | Admit: 2014-07-31 | Discharge: 2014-07-31 | Disposition: A | Discharge status: Home / Self Care | Payer: Self-pay | Source: Ambulatory Visit | Attending: Obstetrics & Gynecology | Admitting: Obstetrics & Gynecology

## 2014-07-31 ENCOUNTER — Encounter (HOSPITAL_COMMUNITY): Payer: Self-pay | Admitting: *Deleted

## 2014-07-31 ENCOUNTER — Inpatient Hospital Stay (HOSPITAL_COMMUNITY): Payer: Medicaid Other

## 2014-07-31 DIAGNOSIS — R509 Fever, unspecified: Secondary | ICD-10-CM | POA: Insufficient documentation

## 2014-07-31 DIAGNOSIS — B349 Viral infection, unspecified: Secondary | ICD-10-CM

## 2014-07-31 DIAGNOSIS — O26813 Pregnancy related exhaustion and fatigue, third trimester: Secondary | ICD-10-CM | POA: Insufficient documentation

## 2014-07-31 DIAGNOSIS — Z3A31 31 weeks gestation of pregnancy: Secondary | ICD-10-CM | POA: Insufficient documentation

## 2014-07-31 DIAGNOSIS — O4703 False labor before 37 completed weeks of gestation, third trimester: Secondary | ICD-10-CM

## 2014-07-31 DIAGNOSIS — R05 Cough: Secondary | ICD-10-CM | POA: Insufficient documentation

## 2014-07-31 LAB — CBC
HCT: 36.3 % (ref 36.0–46.0)
Hemoglobin: 12.3 g/dL (ref 12.0–15.0)
MCH: 30.8 pg (ref 26.0–34.0)
MCHC: 33.9 g/dL (ref 30.0–36.0)
MCV: 91 fL (ref 78.0–100.0)
PLATELETS: 274 10*3/uL (ref 150–400)
RBC: 3.99 MIL/uL (ref 3.87–5.11)
RDW: 12.8 % (ref 11.5–15.5)
WBC: 10.4 10*3/uL (ref 4.0–10.5)

## 2014-07-31 LAB — URINALYSIS, ROUTINE W REFLEX MICROSCOPIC
BILIRUBIN URINE: NEGATIVE
Glucose, UA: NEGATIVE mg/dL
Hgb urine dipstick: NEGATIVE
KETONES UR: NEGATIVE mg/dL
Leukocytes, UA: NEGATIVE
Nitrite: NEGATIVE
PH: 7 (ref 5.0–8.0)
Protein, ur: NEGATIVE mg/dL
Specific Gravity, Urine: 1.015 (ref 1.005–1.030)
Urobilinogen, UA: 0.2 mg/dL (ref 0.0–1.0)

## 2014-07-31 LAB — WET PREP, GENITAL
CLUE CELLS WET PREP: NONE SEEN
TRICH WET PREP: NONE SEEN

## 2014-07-31 LAB — FETAL FIBRONECTIN: Fetal Fibronectin: NEGATIVE

## 2014-07-31 MED ORDER — ONDANSETRON HCL 4 MG/2ML IJ SOLN
4.0000 mg | Freq: Once | INTRAMUSCULAR | Status: AC
  Administered 2014-07-31: 4 mg via INTRAVENOUS
  Filled 2014-07-31: qty 2

## 2014-07-31 MED ORDER — LACTATED RINGERS IV BOLUS (SEPSIS)
1000.0000 mL | Freq: Once | INTRAVENOUS | Status: AC
  Administered 2014-07-31: 1000 mL via INTRAVENOUS

## 2014-07-31 MED ORDER — ALBUTEROL SULFATE (2.5 MG/3ML) 0.083% IN NEBU: 2.5000 mg | INHALATION_SOLUTION | RESPIRATORY_TRACT | Status: DC

## 2014-07-31 MED ORDER — NIFEDIPINE 10 MG PO CAPS: 10.0000 mg | ORAL_CAPSULE | Freq: Three times a day (TID) | ORAL | Status: DC | PRN

## 2014-07-31 MED ORDER — NIFEDIPINE 10 MG PO CAPS
20.0000 mg | ORAL_CAPSULE | Freq: Once | ORAL | Status: AC
  Administered 2014-07-31: 20 mg via ORAL
  Filled 2014-07-31: qty 2

## 2014-07-31 NOTE — MAU Provider Note (Signed)
History     CSN: [id number]  Arrival date and time: 07/31/14 1727   First Provider Initiated Contact with Patient 07/31/14 1945      Chief Complaint  Patient presents with  . Abdominal Pain   HPI   Patient is 28 y.o. [id number] [redacted]w[redacted]d here with complaints of contractions, pelvic pressure, wheezing/fever this morning tmax 101.0. - no hx of wheezing before, did have this morning which resolved spontaneously.  Has not taken anything for fever, overall feels malaise, also notes a mild cough.  Has some nausea as well. +FM, denies LOF, VB, reports + vaginal discharge.   Past Medical History  Diagnosis Date  . Diverticulitis     Past Surgical History  Procedure Laterality Date  . Cholecystectomy    . Cesarean section      Family History  Problem Relation Age of Onset  . Hyperlipidemia Father   . Diabetes Father     History  Substance Use Topics  . Smoking status: Never Smoker   . Smokeless tobacco: Never Used  . Alcohol Use: No    Allergies: No Known Allergies  Prescriptions prior to admission  Medication Sig Dispense Refill Last Dose  . HYDROmorphone (DILAUDID) 2 MG tablet Take 1 tablet (2 mg total) by mouth every 6 (six) hours as needed for severe pain. 15 tablet 0   . Prenatal Vit-Fe Fumarate-FA (MULTIVITAMIN-PRENATAL) 27-0.8 MG TABS tablet Take 1 tablet by mouth daily at 12 noon.   07/17/2014 at Unknown time    Review of Systems  Constitutional: Negative for fever and chills.  Respiratory: Positive for cough, shortness of breath and wheezing.   Cardiovascular: Negative for chest pain and leg swelling.  Gastrointestinal: Positive for nausea. Negative for heartburn, vomiting and diarrhea.  Genitourinary: Negative for dysuria, urgency, frequency and hematuria.  Neurological:       No headache   Physical Exam   Blood pressure 116/62, pulse 82, temperature 98.7 F (37.1 C), temperature source Oral, resp. rate 18, weight 195 lb (88.451 kg), last menstrual period  11/27/2013, SpO2 100 %.  Physical Exam  Constitutional: She is oriented to person, place, and time. She appears well-developed and well-nourished.  HENT:  Head: Normocephalic and atraumatic.  Eyes: Conjunctivae and EOM are normal.  Neck: Normal range of motion.  Cardiovascular: Normal rate, regular rhythm and normal heart sounds.   Respiratory: Effort normal and breath sounds normal. No respiratory distress. She has no wheezes.  GI: Soft. Bowel sounds are normal. She exhibits no distension. There is no tenderness.  Musculoskeletal: Normal range of motion. She exhibits no edema.  Neurological: She is alert and oriented to person, place, and time.  Skin: Skin is warm and dry. No erythema.  Dilation: Closed Effacement (%): Thick Cervical Position: Posterior Station: -3 Exam by:: Dr. Deniece Ree   MAU Course  Procedures  MDM Fever, cough: CBC, UA, CXR 2 view Contractions: place IV, 1L LR bolus for contractions, procardia 20mg  x 1, FFN, wet prep NST reactive: with intermittently regular contractions q 2-51min  Labs: Results for orders placed or performed during the hospital encounter of 07/31/14 (from the past 24 hour(s))  Urinalysis, Routine w reflex microscopic   Collection Time: 07/31/14  5:45 PM  Result Value Ref Range   Color, Urine YELLOW YELLOW   APPearance CLEAR CLEAR   Specific Gravity, Urine 1.015 1.005 - 1.030   pH 7.0 5.0 - 8.0   Glucose, UA NEGATIVE NEGATIVE mg/dL   Hgb urine dipstick NEGATIVE NEGATIVE   Bilirubin Urine  NEGATIVE NEGATIVE   Ketones, ur NEGATIVE NEGATIVE mg/dL   Protein, ur NEGATIVE NEGATIVE mg/dL   Urobilinogen, UA 0.2 0.0 - 1.0 mg/dL   Nitrite NEGATIVE NEGATIVE   Leukocytes, UA NEGATIVE NEGATIVE  Wet prep, genital   Collection Time: 07/31/14  7:49 PM  Result Value Ref Range   Yeast Wet Prep HPF POC FEW (A) NONE SEEN   Trich, Wet Prep NONE SEEN NONE SEEN   Clue Cells Wet Prep HPF POC NONE SEEN NONE SEEN   WBC, Wet Prep HPF POC FEW (A) NONE SEEN   Fetal fibronectin   Collection Time: 07/31/14  7:49 PM  Result Value Ref Range   Fetal Fibronectin NEGATIVE NEGATIVE  CBC   Collection Time: 07/31/14  8:01 PM  Result Value Ref Range   WBC 10.4 4.0 - 10.5 K/uL   RBC 3.99 3.87 - 5.11 MIL/uL   Hemoglobin 12.3 12.0 - 15.0 g/dL   HCT 36.3 36.0 - 46.0 %   MCV 91.0 78.0 - 100.0 fL   MCH 30.8 26.0 - 34.0 pg   MCHC 33.9 30.0 - 36.0 g/dL   RDW 12.8 11.5 - 15.5 %   Platelets 274 150 - 400 K/uL    Imaging Studies:    Assessment and Plan  Patient is 28 y.o. [id number] 104w5d reporting malaise/fever/cough and contractions  Care transitioned to [facility] at 2015 who will follow up labs and cxr to rule out pneumonia or other source of infection.  Will also follow up contractions and threatened preterm labor.   ACOSTA,KRISTY [name] 07/31/2014, 8:45 PM   Chest X-Ray There is no evidence of focal airspace disease, pulmonary edema, suspicious pulmonary nodule/mass, pleural effusion, or pneumothorax. No acute bony abnormalities are identified.  IMPRESSION: No evidence of acute cardiopulmonary disease.  2210 Reexamined patient.  Lungs - CTAB, O2 Sat 100.  Cervix - closed/thick.  Pt reports resolved wheezing.  Feels occasional contraction.  Assessment: 28 yo G5P1122 at [redacted]w[redacted]d wks IUP Viral Illness Category I FHR Tracing Preterm Contractions - no cervical change, neg FFN  Plan: Discharge to home OTC Tylenol Cold and Flu RX Procardia 10 (#5) mg prn for discomfort with contractions; Reemphasized to return to MAU for >6 contractions per hour  Gwen Pounds, CNM

## 2014-07-31 NOTE — MAU Note (Signed)
Pain in lower abd.  Pain comes and goes, worse today.  Was given medication for the pain, but it is not taking the pain away.

## 2014-07-31 NOTE — MAU Note (Addendum)
Reports fever of 101 this morning, took tylenol and it went down.  Denies cough or sore throat.  Has had some shortness of breath and wheazing. Baby active

## 2014-08-01 NOTE — Progress Notes (Signed)
Assisted RN with interpretation of triage. [place]

## 2014-08-01 NOTE — Progress Notes (Signed)
Assisted RN and Radiology Tech with interpretation of X-ray directions [place]

## 2014-08-01 NOTE — Progress Notes (Signed)
Assisted RN with interpretation of discharge instructions  [place]

## 2014-08-01 NOTE — Progress Notes (Signed)
Assisted RN with an assessment Spanish Interpreter - Benita Laurin Coder

## 2014-08-01 NOTE — Progress Notes (Signed)
Assisted registration with interpretation of patient information.  [place]

## 2014-08-01 NOTE — Progress Notes (Signed)
Assisted RN with assessment interpretation.  [place]

## 2014-08-02 ENCOUNTER — Inpatient Hospital Stay (HOSPITAL_COMMUNITY)
Admission: AD | Admit: 2014-08-02 | Discharge: 2014-08-05 | DRG: 781 | Disposition: A | Discharge status: Home / Self Care | Payer: Self-pay | Source: Ambulatory Visit | Attending: Obstetrics and Gynecology | Admitting: Obstetrics and Gynecology

## 2014-08-02 ENCOUNTER — Inpatient Hospital Stay (HOSPITAL_COMMUNITY): Payer: Self-pay

## 2014-08-02 ENCOUNTER — Telehealth: Payer: Self-pay | Admitting: *Deleted

## 2014-08-02 ENCOUNTER — Encounter (HOSPITAL_COMMUNITY): Payer: Self-pay | Admitting: *Deleted

## 2014-08-02 DIAGNOSIS — Z975 Presence of (intrauterine) contraceptive device: Secondary | ICD-10-CM

## 2014-08-02 DIAGNOSIS — O468X3 Other antepartum hemorrhage, third trimester: Principal | ICD-10-CM | POA: Diagnosis present

## 2014-08-02 DIAGNOSIS — O09219 Supervision of pregnancy with history of pre-term labor, unspecified trimester: Secondary | ICD-10-CM

## 2014-08-02 DIAGNOSIS — O2303 Infections of kidney in pregnancy, third trimester: Secondary | ICD-10-CM | POA: Diagnosis present

## 2014-08-02 DIAGNOSIS — O09212 Supervision of pregnancy with history of pre-term labor, second trimester: Secondary | ICD-10-CM

## 2014-08-02 DIAGNOSIS — O09892 Supervision of other high risk pregnancies, second trimester: Secondary | ICD-10-CM

## 2014-08-02 DIAGNOSIS — O09899 Supervision of other high risk pregnancies, unspecified trimester: Secondary | ICD-10-CM

## 2014-08-02 DIAGNOSIS — O368130 Decreased fetal movements, third trimester, not applicable or unspecified: Secondary | ICD-10-CM | POA: Diagnosis present

## 2014-08-02 DIAGNOSIS — O34219 Maternal care for unspecified type scar from previous cesarean delivery: Secondary | ICD-10-CM

## 2014-08-02 DIAGNOSIS — Z3A32 32 weeks gestation of pregnancy: Secondary | ICD-10-CM | POA: Diagnosis present

## 2014-08-02 DIAGNOSIS — O99213 Obesity complicating pregnancy, third trimester: Secondary | ICD-10-CM | POA: Diagnosis present

## 2014-08-02 DIAGNOSIS — N12 Tubulo-interstitial nephritis, not specified as acute or chronic: Secondary | ICD-10-CM | POA: Diagnosis present

## 2014-08-02 DIAGNOSIS — Z3483 Encounter for supervision of other normal pregnancy, third trimester: Secondary | ICD-10-CM

## 2014-08-02 DIAGNOSIS — O09213 Supervision of pregnancy with history of pre-term labor, third trimester: Secondary | ICD-10-CM

## 2014-08-02 DIAGNOSIS — Z3480 Encounter for supervision of other normal pregnancy, unspecified trimester: Secondary | ICD-10-CM

## 2014-08-02 DIAGNOSIS — K5792 Diverticulitis of intestine, part unspecified, without perforation or abscess without bleeding: Secondary | ICD-10-CM | POA: Diagnosis present

## 2014-08-02 DIAGNOSIS — O4690 Antepartum hemorrhage, unspecified, unspecified trimester: Secondary | ICD-10-CM | POA: Diagnosis present

## 2014-08-02 DIAGNOSIS — O3421 Maternal care for scar from previous cesarean delivery: Secondary | ICD-10-CM | POA: Diagnosis present

## 2014-08-02 DIAGNOSIS — E860 Dehydration: Secondary | ICD-10-CM | POA: Diagnosis present

## 2014-08-02 DIAGNOSIS — O368190 Decreased fetal movements, unspecified trimester, not applicable or unspecified: Secondary | ICD-10-CM | POA: Diagnosis present

## 2014-08-02 DIAGNOSIS — Z6838 Body mass index (BMI) 38.0-38.9, adult: Secondary | ICD-10-CM

## 2014-08-02 DIAGNOSIS — N939 Abnormal uterine and vaginal bleeding, unspecified: Secondary | ICD-10-CM | POA: Diagnosis present

## 2014-08-02 DIAGNOSIS — O42919 Preterm premature rupture of membranes, unspecified as to length of time between rupture and onset of labor, unspecified trimester: Secondary | ICD-10-CM | POA: Insufficient documentation

## 2014-08-02 LAB — RAPID URINE DRUG SCREEN, HOSP PERFORMED
Amphetamines: NOT DETECTED
BENZODIAZEPINES: NOT DETECTED
Barbiturates: NOT DETECTED
COCAINE: NOT DETECTED
Opiates: NOT DETECTED
Tetrahydrocannabinol: NOT DETECTED

## 2014-08-02 LAB — CBC WITH DIFFERENTIAL/PLATELET
BASOS ABS: 0 10*3/uL (ref 0.0–0.1)
BASOS PCT: 0 % (ref 0–1)
Eosinophils Absolute: 0.1 10*3/uL (ref 0.0–0.7)
Eosinophils Relative: 1 % (ref 0–5)
HCT: 33.7 % — ABNORMAL LOW (ref 36.0–46.0)
HEMOGLOBIN: 11 g/dL — AB (ref 12.0–15.0)
Lymphocytes Relative: 21 % (ref 12–46)
Lymphs Abs: 1.8 10*3/uL (ref 0.7–4.0)
MCH: 29.7 pg (ref 26.0–34.0)
MCHC: 32.6 g/dL (ref 30.0–36.0)
MCV: 91.1 fL (ref 78.0–100.0)
Monocytes Absolute: 0.4 10*3/uL (ref 0.1–1.0)
Monocytes Relative: 5 % (ref 3–12)
NEUTROS ABS: 5.9 10*3/uL (ref 1.7–7.7)
NEUTROS PCT: 73 % (ref 43–77)
Platelets: 247 10*3/uL (ref 150–400)
RBC: 3.7 MIL/uL — ABNORMAL LOW (ref 3.87–5.11)
RDW: 12.7 % (ref 11.5–15.5)
WBC: 8.2 10*3/uL (ref 4.0–10.5)

## 2014-08-02 LAB — AMNISURE RUPTURE OF MEMBRANE (ROM) NOT AT ARMC: Amnisure ROM: POSITIVE

## 2014-08-02 LAB — PROTIME-INR
INR: 0.96 (ref 0.00–1.49)
Prothrombin Time: 12.9 seconds (ref 11.6–15.2)

## 2014-08-02 LAB — URINALYSIS, ROUTINE W REFLEX MICROSCOPIC
Bilirubin Urine: NEGATIVE
Glucose, UA: NEGATIVE mg/dL
KETONES UR: NEGATIVE mg/dL
LEUKOCYTES UA: NEGATIVE
Nitrite: NEGATIVE
Protein, ur: NEGATIVE mg/dL
Specific Gravity, Urine: 1.015 (ref 1.005–1.030)
Urobilinogen, UA: 0.2 mg/dL (ref 0.0–1.0)
pH: 7 (ref 5.0–8.0)

## 2014-08-02 LAB — URINE MICROSCOPIC-ADD ON: WBC, UA: NONE SEEN WBC/hpf (ref <3)

## 2014-08-02 LAB — GROUP B STREP BY PCR: Group B strep by PCR: NEGATIVE

## 2014-08-02 LAB — WET PREP, GENITAL
CLUE CELLS WET PREP: NONE SEEN
TRICH WET PREP: NONE SEEN
WBC WET PREP: NONE SEEN

## 2014-08-02 LAB — TYPE AND SCREEN
ABO/RH(D): O POS
ANTIBODY SCREEN: NEGATIVE

## 2014-08-02 LAB — OB RESULTS CONSOLE GC/CHLAMYDIA
CHLAMYDIA, DNA PROBE: NEGATIVE
Gonorrhea: NEGATIVE

## 2014-08-02 MED ORDER — PRENATAL MULTIVITAMIN CH
1.0000 | ORAL_TABLET | Freq: Every day | ORAL | Status: DC
  Administered 2014-08-03 – 2014-08-05 (×3): 1 via ORAL
  Filled 2014-08-02 (×4): qty 1

## 2014-08-02 MED ORDER — CALCIUM CARBONATE ANTACID 500 MG PO CHEW
2.0000 | CHEWABLE_TABLET | ORAL | Status: DC | PRN
  Filled 2014-08-02: qty 2

## 2014-08-02 MED ORDER — SODIUM CHLORIDE 0.9 % IV SOLN
250.0000 mg | Freq: Four times a day (QID) | INTRAVENOUS | Status: DC
  Administered 2014-08-02 – 2014-08-03 (×4): 250 mg via INTRAVENOUS
  Filled 2014-08-02 (×6): qty 5

## 2014-08-02 MED ORDER — FLUCONAZOLE 150 MG PO TABS
150.0000 mg | ORAL_TABLET | Freq: Every day | ORAL | Status: AC
  Administered 2014-08-03: 150 mg via ORAL
  Filled 2014-08-02: qty 1

## 2014-08-02 MED ORDER — ZOLPIDEM TARTRATE 5 MG PO TABS
5.0000 mg | ORAL_TABLET | Freq: Every evening | ORAL | Status: DC | PRN
  Administered 2014-08-03 – 2014-08-04 (×2): 5 mg via ORAL
  Filled 2014-08-02 (×2): qty 1

## 2014-08-02 MED ORDER — BETAMETHASONE SOD PHOS & ACET 6 (3-3) MG/ML IJ SUSP
12.0000 mg | Freq: Once | INTRAMUSCULAR | Status: AC
  Administered 2014-08-03: 12 mg via INTRAMUSCULAR
  Filled 2014-08-02: qty 2

## 2014-08-02 MED ORDER — DOCUSATE SODIUM 100 MG PO CAPS
100.0000 mg | ORAL_CAPSULE | Freq: Every day | ORAL | Status: DC
  Administered 2014-08-03 – 2014-08-05 (×3): 100 mg via ORAL
  Filled 2014-08-02 (×4): qty 1

## 2014-08-02 MED ORDER — BETAMETHASONE SOD PHOS & ACET 6 (3-3) MG/ML IJ SUSP
12.0000 mg | Freq: Once | INTRAMUSCULAR | Status: AC
  Administered 2014-08-02: 12 mg via INTRAMUSCULAR
  Filled 2014-08-02: qty 2

## 2014-08-02 MED ORDER — ERYTHROMYCIN BASE 250 MG PO TABS: 250.0000 mg | ORAL_TABLET | Freq: Four times a day (QID) | ORAL | Status: DC

## 2014-08-02 MED ORDER — AMOXICILLIN 500 MG PO CAPS: 500.0000 mg | ORAL_CAPSULE | Freq: Three times a day (TID) | ORAL | Status: DC

## 2014-08-02 MED ORDER — SODIUM CHLORIDE 0.9 % IV SOLN
2.0000 g | Freq: Four times a day (QID) | INTRAVENOUS | Status: DC
  Administered 2014-08-02 – 2014-08-03 (×4): 2 g via INTRAVENOUS
  Filled 2014-08-02 (×6): qty 2000

## 2014-08-02 MED ORDER — ACETAMINOPHEN 325 MG PO TABS: 650.0000 mg | ORAL_TABLET | ORAL | Status: DC | PRN

## 2014-08-02 NOTE — MAU Note (Signed)
Patient taken to a room in MAU. Wiped with tissue and bright red blood on tissue, no free bleeding noted.

## 2014-08-02 NOTE — Telephone Encounter (Signed)
Patient called and left message on nurse line that baby is not moving. Called patient back with spanish interpreter Eyvonne Mechanic, and patient stated that she felt a slight movement but much less than usual. I advised that she go to MAU to be evaluated. Patient is agreeable to this.

## 2014-08-02 NOTE — H&P (Signed)
FACULTY PRACTICE ANTEPARTUM ADMISSION HISTORY AND PHYSICAL NOTE   History of Present Illness: Lorraine [name] is a 28 y.o. [id number] at [redacted]w[redacted]d admitted for vaginal bleeding in setting of likely PPROM.    Pt seen in MAU on 11/14 with with complaints of contractions, pelvic pressure, wheezing/fever this morning tmax 101.0. In MAU noted to be with contractions q 2-4 minutes, but no cervical change. Had w/up for fever with neg CXR and normal U/A. Discharged home with procardia (which she did not fill) and OTC tylenol and flu (which she has not been taking).   Reports since being at home, she has no longer had fever, but has continued to have ongoing "mucous" discharge, more than normal. Also reports ongoing cramping and discomfort. This AM, states she has not felt fetal movement since ~ 11am and called clinic, who recommended coming to MAU for eval. In MAU, went to bathroom and wiped and noted a quarter sized bright red blood clot on toilet paper.  Patient reports the fetal movement as decreased . Patient reports uterine contraction  activity as irregular  Patient reports  vaginal bleeding as spotting. Patient describes fluid per vagina as Other mucous, no large gush.   Patient Active Problem List   Diagnosis Date Noted  . Vaginal bleeding 08/02/2014  . Preterm premature rupture of membranes (PPROM) with unknown onset of labor 08/02/2014  . Decreased fetal movement 08/02/2014  . Pyelonephritis 07/12/2014  . Previous cesarean section complicating pregnancy 82/95/6213  . Supervision of other normal pregnancy, antepartum 03/02/2014  . H/O preterm delivery, currently pregnant 03/02/2014  . Diverticulitis 09/10/2012  . Abdominal pain 09/09/2012  . Nausea and vomiting 09/09/2012  . Dehydration 09/09/2012  . IUD contraception 09/09/2012     Past Medical History  Diagnosis Date  . Diverticulitis      Past Surgical History  Procedure Laterality Date  . Cholecystectomy    . Cesarean  section       OB History    Gravida Para Term Preterm AB TAB SAB Ectopic Multiple Living   5 2 1 1 2  2   2       History   Social History  . Marital Status: Married    Spouse Name: N/A    Number of Children: N/A  . Years of Education: N/A   Social History Main Topics  . Smoking status: Never Smoker   . Smokeless tobacco: Never Used  . Alcohol Use: No  . Drug Use: No  . Sexual Activity: Yes    Birth Control/ Protection: None   Other Topics Concern  . None   Social History Narrative   ** Merged History Encounter **        Family History  Problem Relation Age of Onset  . Hyperlipidemia Mother   . Diabetes Maternal Uncle   . Diabetes Paternal Grandmother     No Known Allergies  Prescriptions prior to admission  Medication Sig Dispense Refill Last Dose  . HYDROmorphone (DILAUDID) 2 MG tablet Take 1 tablet (2 mg total) by mouth every 6 (six) hours as needed for severe pain. 15 tablet 0 Past Month at Unknown time  . NIFEdipine (PROCARDIA) 10 MG capsule Take 1 capsule (10 mg total) by mouth every 8 (eight) hours as needed. 6 capsule 0 Past Week at Unknown time  . Prenatal Vit-Fe Fumarate-FA (MULTIVITAMIN-PRENATAL) 27-0.8 MG TABS tablet Take 1 tablet by mouth daily at 12 noon.   08/02/2014 at Unknown time    . ampicillin (OMNIPEN) IV  2 g Intravenous Q6H   Followed by  . [START ON 08/04/2014] amoxicillin  500 mg Oral Q8H  . [START ON 08/03/2014] betamethasone acetate-betamethasone sodium phosphate  12 mg Intramuscular Once  . [START ON 08/03/2014] docusate sodium  100 mg Oral Daily  . erythromycin  250 mg Intravenous Q6H   Followed by  . [START ON 08/05/2014] erythromycin  250 mg Oral Q6H  . [START ON 08/03/2014] prenatal multivitamin  1 tablet Oral Q1200   I have reviewed the patient's current medications.  Review of Systems - Negative except fever on 11/14, mild cough.  Vitals:  BP 115/62 mmHg  Pulse 83  Temp(Src) 98.4 F (36.9 C) (Oral)  Resp 20  Ht 5'  (1.524 m)  Wt 195 lb (88.451 kg)  BMI 38.08 kg/m2  LMP 11/27/2013 Physical Examination:  General appearance - alert, well appearing, and in no distress, acyanotic, in no respiratory distress and well hydrated Abdomen: gravid, non tender, and soft and fundal height  is size equals dates Pelvic Exam:VULVA: normal appearing vulva with no masses, tenderness or lesions, VAGINA: vaginal discharge - white, CERVIX: normal appearing cervix without discharge or lesions, with quarter-sized blood clot extending from cervical os, UTERUS: uterus is normal size, shape, consistency and nontender, WET MOUNT done - results: hyphae Cervix: Evaluated by sterile speculum exam., Evaluated by digital exam., Position: posterior, Dilation: 0cm, Thickness: 3 cm and Consistency: soft and found to be 3 cm/ Long/Floating and fetal presentation is unsure. Extremities: extremities normal, atraumatic, no cyanosis or edema with DTRs 2+ bilaterally Membranes: sterile speculum exam without pooling Fetal Monitoring:Baseline: 150 bpm, Variability: Good {> 6 bpm), Accelerations: Reactive and Decelerations: Absent TOCO: irritability Labs:  Results for orders placed or performed during the hospital encounter of 08/02/14 (from the past 24 hour(s))  Wet prep, genital   Collection Time: 08/02/14  5:35 PM  Result Value Ref Range   Yeast Wet Prep HPF POC FEW (A) NONE SEEN   Trich, Wet Prep NONE SEEN NONE SEEN   Clue Cells Wet Prep HPF POC NONE SEEN NONE SEEN   WBC, Wet Prep HPF POC NONE SEEN NONE SEEN  Group B strep by PCR   Collection Time: 08/02/14  5:35 PM  Result Value Ref Range   Group B strep by PCR NEGATIVE NEGATIVE  Amnisure rupture of membrane (rom)   Collection Time: 08/02/14  5:45 PM  Result Value Ref Range   Amnisure ROM POSITIVE   CBC with Differential   Collection Time: 08/02/14  6:06 PM  Result Value Ref Range   WBC 8.2 4.0 - 10.5 K/uL   RBC 3.70 (L) 3.87 - 5.11 MIL/uL   Hemoglobin 11.0 (L) 12.0 - 15.0 g/dL    HCT 33.7 (L) 36.0 - 46.0 %   MCV 91.1 78.0 - 100.0 fL   MCH 29.7 26.0 - 34.0 pg   MCHC 32.6 30.0 - 36.0 g/dL   RDW 12.7 11.5 - 15.5 %   Platelets 247 150 - 400 K/uL   Neutrophils Relative % 73 43 - 77 %   Neutro Abs 5.9 1.7 - 7.7 K/uL   Lymphocytes Relative 21 12 - 46 %   Lymphs Abs 1.8 0.7 - 4.0 K/uL   Monocytes Relative 5 3 - 12 %   Monocytes Absolute 0.4 0.1 - 1.0 K/uL   Eosinophils Relative 1 0 - 5 %   Eosinophils Absolute 0.1 0.0 - 0.7 K/uL   Basophils Relative 0 0 - 1 %   Basophils Absolute 0.0 0.0 -  0.1 K/uL  Protime-INR   Collection Time: 08/02/14  6:22 PM  Result Value Ref Range   Prothrombin Time 12.9 11.6 - 15.2 seconds   INR 0.96 0.00 - 1.49  Type and screen   Collection Time: 08/02/14  7:51 PM  Result Value Ref Range   ABO/RH(D) O POS    Antibody Screen NEG    Sample Expiration 08/05/2014   Urinalysis, Routine w reflex microscopic   Collection Time: 08/02/14 10:20 PM  Result Value Ref Range   Color, Urine YELLOW YELLOW   APPearance CLEAR CLEAR   Specific Gravity, Urine 1.015 1.005 - 1.030   pH 7.0 5.0 - 8.0   Glucose, UA NEGATIVE NEGATIVE mg/dL   Hgb urine dipstick TRACE (A) NEGATIVE   Bilirubin Urine NEGATIVE NEGATIVE   Ketones, ur NEGATIVE NEGATIVE mg/dL   Protein, ur NEGATIVE NEGATIVE mg/dL   Urobilinogen, UA 0.2 0.0 - 1.0 mg/dL   Nitrite NEGATIVE NEGATIVE   Leukocytes, UA NEGATIVE NEGATIVE  Urine microscopic-add on   Collection Time: 08/02/14 10:20 PM  Result Value Ref Range   Squamous Epithelial / LPF RARE RARE   WBC, UA  <3 WBC/hpf    NO FORMED ELEMENTS SEEN ON URINE MICROSCOPIC EXAMINATION   RBC / HPF 0-2 <3 RBC/hpf   Bacteria, UA FEW (A) RARE    Imaging Studies: Dg Chest 2 View  07/31/2014   CLINICAL DATA:  28 year old pregnant female with wheezing.  EXAM: CHEST  2 VIEW  COMPARISON:  04/14/2014 chest radiograph  FINDINGS: The cardiomediastinal silhouette is unremarkable.  Mild peribronchial thickening is unchanged.  There is no evidence of  focal airspace disease, pulmonary edema, suspicious pulmonary nodule/mass, pleural effusion, or pneumothorax. No acute bony abnormalities are identified.  IMPRESSION: No evidence of acute cardiopulmonary disease.   Electronically Signed   By: Hassan Rowan M.D.   On: 07/31/2014 21:05   Mr Pelvis Wo Contrast  07/12/2014   CLINICAL DATA:  Twenty-nine weeks pregnant patient with right lower quadrant pain. Patient experienced nausea and vomiting. Prior history of diverticulitis.  EXAM: MRI ABDOMEN AND PELVIS WITHOUT CONTRAST  TECHNIQUE: Multiplanar multisequence MR imaging of the abdomen and pelvis was performed. No intravenous contrast was administered.  COMPARISON:  Ultrasound 07/09/2014.  CT abdomen 11/11/2013  FINDINGS: MRI ABDOMEN FINDINGS  Limited view of the liver is unremarkable. Patient status post cholecystectomy. The common bile duct is normal caliber. Pancreas is normal. Spleen is normal. Adrenal glands normal.  There is mild hydronephrosis and hydroureter of the right kidney. This degree of obstruction is felt to be within normal limits for hydronephrosis of pregnancy. No obstructing calculus is identified although MRI is less sensitive than CT for detecting calculus.  The stomach, small bowel, and cecum normal. The terminal ileum is normal. The appendix is not clearly identified; however there is no evidence of inflammation in the right lower quadrant to suggest acute appendicitis. There are diverticula of the left colon without evidence of acute inflammation.  Abdominal aorta is normal caliber. No retroperitoneal periportal lymphadenopathy.  MRI PELVIS FINDINGS  Gravid uterus. The placenta appears normal in a the posterior location. Fetus is grossly normal with cephalic presentation. Bladder is normal. The right ovary is normal size measuring 3.5 by 4.5 x 2.3 cm. The left ovary all is not well demonstrated. Left ovarian tissue may be present on image 38 of series 6. No significant free fluid in the pelvis.   IMPRESSION: 1. No evidence acute appendicitis. Appendix is not identified; however there are no secondary  signs of appendicitis. 2. Mild hydronephrosis and hydroureter on the right is felt to represent physiologic hydronephrosis of pregnancy. 3. Normal right ovary.  Left ovarian tissue is difficult to define. 4. Left colon diverticulosis without diverticulitis.   Electronically Signed   By: Suzy Bouchard M.D.   On: 07/12/2014 10:32   Mr Abdomen Wo Contrast  07/12/2014   CLINICAL DATA:  Twenty-nine weeks pregnant patient with right lower quadrant pain. Patient experienced nausea and vomiting. Prior history of diverticulitis.  EXAM: MRI ABDOMEN AND PELVIS WITHOUT CONTRAST  TECHNIQUE: Multiplanar multisequence MR imaging of the abdomen and pelvis was performed. No intravenous contrast was administered.  COMPARISON:  Ultrasound 07/09/2014.  CT abdomen 11/11/2013  FINDINGS: MRI ABDOMEN FINDINGS  Limited view of the liver is unremarkable. Patient status post cholecystectomy. The common bile duct is normal caliber. Pancreas is normal. Spleen is normal. Adrenal glands normal.  There is mild hydronephrosis and hydroureter of the right kidney. This degree of obstruction is felt to be within normal limits for hydronephrosis of pregnancy. No obstructing calculus is identified although MRI is less sensitive than CT for detecting calculus.  The stomach, small bowel, and cecum normal. The terminal ileum is normal. The appendix is not clearly identified; however there is no evidence of inflammation in the right lower quadrant to suggest acute appendicitis. There are diverticula of the left colon without evidence of acute inflammation.  Abdominal aorta is normal caliber. No retroperitoneal periportal lymphadenopathy.  MRI PELVIS FINDINGS  Gravid uterus. The placenta appears normal in a the posterior location. Fetus is grossly normal with cephalic presentation. Bladder is normal. The right ovary is normal size measuring 3.5  by 4.5 x 2.3 cm. The left ovary all is not well demonstrated. Left ovarian tissue may be present on image 38 of series 6. No significant free fluid in the pelvis.  IMPRESSION: 1. No evidence acute appendicitis. Appendix is not identified; however there are no secondary signs of appendicitis. 2. Mild hydronephrosis and hydroureter on the right is felt to represent physiologic hydronephrosis of pregnancy. 3. Normal right ovary.  Left ovarian tissue is difficult to define. 4. Left colon diverticulosis without diverticulitis.   Electronically Signed   By: Suzy Bouchard M.D.   On: 07/12/2014 10:32   US Ob Limited  07/14/2014   OBSTETRICAL ULTRASOUND: This exam was performed within a [facility]. The OB US report was generated in the AS system, and faxed to the ordering physician.   This report is available in the [facility]. See the AS Obstetric US report via the Image Link.  US Pelvis Limited  07/14/2014   CLINICAL DATA:  Hydronephrosis, [redacted] weeks pregnant, evaluate for ureteral calculus  EXAM: US PELVIS LIMITED  TECHNIQUE: Ultrasound examination of the pelvic soft tissues was performed in the area of clinical concern.  COMPARISON:  Renal ultrasound dated 07/09/2014  FINDINGS: Bladder is within normal limits. Bilateral bladder jets are visualized.  Distal right ureter is not dilated as it enters the UVJ.  No distal right ureteral calculus is seen.  IMPRESSION: Bilateral bladder jets are visualized.  No distal right ureteral calculus is seen.   Electronically Signed   By: Julian Hy M.D.   On: 07/14/2014 10:38   US Renal  07/09/2014   CLINICAL DATA:  Right upper quadrant and flank pain since July 07, 2014.  EXAM: RENAL/URINARY TRACT ULTRASOUND COMPLETE  COMPARISON:  None.  FINDINGS: Right Kidney:  Length: 10.7 cm. Echogenicity within normal limits. No mass visualized.  There is prominent right renal pelvis.  Left Kidney:  Length: 11.5 cm. Echogenicity within normal limits. No  mass or hydronephrosis visualized.  Bladder:  Appears normal for degree of bladder distention. A left ureteral jet is identified. No right ureteral jet visualized.  IMPRESSION: Prominent right renal pelvis, this is nonspecific. No dilated right ureter identified. There is no right ureteral jets noted in the bladder. Consider further evaluation with CT urogram to assess right collecting system if clinically indicated.   Electronically Signed   By: Abelardo Diesel M.D.   On: 07/09/2014 13:49     Assessment and Plan: Patient Active Problem List   Diagnosis Date Noted  . Vaginal bleeding 08/02/2014  . Preterm premature rupture of membranes (PPROM) with unknown onset of labor 08/02/2014  . Decreased fetal movement 08/02/2014  . Pyelonephritis 07/12/2014  . Previous cesarean section complicating pregnancy 65/68/1275  . Supervision of other normal pregnancy, antepartum 03/02/2014  . H/O preterm delivery, currently pregnant 03/02/2014  . Diverticulitis 09/10/2012  . Abdominal pain 09/09/2012  . Nausea and vomiting 09/09/2012  . Dehydration 09/09/2012  . IUD contraception 09/09/2012   28 yo [id number] @ [redacted]w[redacted]d by 1st trimester [place] who presents with decreased fetal movement and found to have vaginal bleeding in setting of PPROM.  #) PPROM (POA) - Amnisure positive, but no pooling on exam. AFI 14.21 with MVP 6.36 cm. Pt reports increase in vaginal discharge over past week, but describes as "mucous," no gush of fluid. + fever to 101 F on 11/14 without clear source, but afebrile since. - Admit for latency abx with ampicillin 2 g q 6 hrs x 8 doses followed by amoxicillin 500 mg q 8 hours x 15 doses; erythromyocin 250 mg q 6 hrs x 8 doses followed by erythromyocin 250 mg q 8 hours x 20 doses (day 1 11/16) - Neuroprotective magnesium not indicated given GA of [redacted]w[redacted]d - CTM, plan on repeat amnisure in 48 hours given low suspicion for PPROM. If positive, then plan for delivery at 67 ws, if signs of infection, or  maternal-fetal indication - GBS neg   #) Vaginal Bleeding (POA) - Pt with onset of bright red vaginal bleeding in MAU this evening. Endorses preceeding cramping and dec FM. + blood clot coming from cervical os in SVE. Cervix c/t/h. Posterior placenta on [place] w/ no evidence of abruption. Potential bleeding in setting of PPROM vs PTL (although no cervical dilation) vs abruption. Much less likely vaginitis with only few yeast on wet prep. Blood type O+, no rhogham needed - CBC q day,T&S - Wet prep w/ few yeast, tx w/ fluconazole 150 mg PO x 1 - GC/Ct pending - Utox pending - Pad counts  #) PNC - UTD. - GBS neg - Tdap 10/13, flu 11/2 - MOD: c-section given h/o c-section x 2  #) FWB - BMX x 1 given, repeat in 24 hours - NST reactive; repeat q shift - BPP 8/8 for dec FM - growth scan ordered for 11/17 - If PPROM confirmed, plan for delivery at 34 weeks vis repeat LTCS or sooner if evidence of infection or maternal/fetal indication   Josephine Cables, MD OB fellow Faculty Practice, [place]

## 2014-08-03 ENCOUNTER — Inpatient Hospital Stay (HOSPITAL_COMMUNITY): Payer: Medicaid Other

## 2014-08-03 DIAGNOSIS — N939 Abnormal uterine and vaginal bleeding, unspecified: Secondary | ICD-10-CM

## 2014-08-03 DIAGNOSIS — O3421 Maternal care for scar from previous cesarean delivery: Secondary | ICD-10-CM

## 2014-08-03 DIAGNOSIS — O4690 Antepartum hemorrhage, unspecified, unspecified trimester: Secondary | ICD-10-CM | POA: Diagnosis present

## 2014-08-03 DIAGNOSIS — O42919 Preterm premature rupture of membranes, unspecified as to length of time between rupture and onset of labor, unspecified trimester: Secondary | ICD-10-CM

## 2014-08-03 DIAGNOSIS — Z3A32 32 weeks gestation of pregnancy: Secondary | ICD-10-CM | POA: Diagnosis present

## 2014-08-03 DIAGNOSIS — O99213 Obesity complicating pregnancy, third trimester: Secondary | ICD-10-CM | POA: Diagnosis present

## 2014-08-03 LAB — CBC
HEMATOCRIT: 32.5 % — AB (ref 36.0–46.0)
Hemoglobin: 10.7 g/dL — ABNORMAL LOW (ref 12.0–15.0)
MCH: 29.8 pg (ref 26.0–34.0)
MCHC: 32.9 g/dL (ref 30.0–36.0)
MCV: 90.5 fL (ref 78.0–100.0)
PLATELETS: 253 10*3/uL (ref 150–400)
RBC: 3.59 MIL/uL — AB (ref 3.87–5.11)
RDW: 12.7 % (ref 11.5–15.5)
WBC: 9.7 10*3/uL (ref 4.0–10.5)

## 2014-08-03 LAB — COMPREHENSIVE METABOLIC PANEL
ALBUMIN: 2.7 g/dL — AB (ref 3.5–5.2)
ALT: 14 U/L (ref 0–35)
AST: 20 U/L (ref 0–37)
Alkaline Phosphatase: 147 U/L — ABNORMAL HIGH (ref 39–117)
Anion gap: 13 (ref 5–15)
BUN: 8 mg/dL (ref 6–23)
CALCIUM: 9.2 mg/dL (ref 8.4–10.5)
CO2: 19 meq/L (ref 19–32)
Chloride: 104 mEq/L (ref 96–112)
Creatinine, Ser: 0.59 mg/dL (ref 0.50–1.10)
GFR calc Af Amer: >90 mL/min (ref >90)
Glucose, Bld: 130 mg/dL — ABNORMAL HIGH (ref 70–99)
Potassium: 4.5 mEq/L (ref 3.7–5.3)
SODIUM: 136 meq/L — AB (ref 137–147)
Total Bilirubin: <0.2 mg/dL — ABNORMAL LOW (ref 0.3–1.2)
Total Protein: 6.3 g/dL (ref 6.0–8.3)

## 2014-08-03 LAB — GC/CHLAMYDIA PROBE AMP
CT Probe RNA: NEGATIVE
GC Probe RNA: NEGATIVE

## 2014-08-03 LAB — AMNISURE RUPTURE OF MEMBRANE (ROM) NOT AT ARMC: Amnisure ROM: NEGATIVE

## 2014-08-03 MED ORDER — PROMETHAZINE HCL 25 MG/ML IJ SOLN
25.0000 mg | Freq: Four times a day (QID) | INTRAMUSCULAR | Status: DC | PRN
  Administered 2014-08-03 – 2014-08-04 (×2): 25 mg via INTRAVENOUS
  Filled 2014-08-03 (×2): qty 1

## 2014-08-03 MED ORDER — PRENATAL MULTIVITAMIN CH: 1.0000 | ORAL_TABLET | Freq: Every day | ORAL | Status: DC

## 2014-08-03 MED ORDER — PRENATAL 27-0.8 MG PO TABS: 1.0000 | ORAL_TABLET | Freq: Every day | ORAL | Status: DC

## 2014-08-03 MED ORDER — LACTATED RINGERS IV SOLN
INTRAVENOUS | Status: DC
  Administered 2014-08-03 – 2014-08-05 (×5): via INTRAVENOUS

## 2014-08-03 MED ORDER — BUTORPHANOL TARTRATE 1 MG/ML IJ SOLN
2.0000 mg | Freq: Once | INTRAMUSCULAR | Status: AC
  Administered 2014-08-03: 2 mg via INTRAVENOUS
  Filled 2014-08-03: qty 2

## 2014-08-03 MED ORDER — LACTATED RINGERS IV SOLN
INTRAVENOUS | Status: DC
  Administered 2014-08-02: 22:00:00 via INTRAVENOUS

## 2014-08-03 NOTE — Evaluation (Signed)
[employer] interpreters language line used for MFM appointment.

## 2014-08-03 NOTE — Plan of Care (Signed)
Problem: Consults Goal: Antepartum Patient Education Outcome: Completed/Met Date Met:  08/03/14

## 2014-08-03 NOTE — Progress Notes (Signed)
Patient ID: Lorraine [name], female   DOB: [birth year], 28 y.o.   MRN: [id number] [place]) NOTE  Lorraine [name] is a 28 y.o. [id number] at [redacted]w[redacted]d  who is admitted for PROM, and 3rd trimester vaginal bleeding.    Fetal presentation is unsure. Length of Stay:  1  Days  Date of admission:08/02/2014  Subjective: Patient reports persistent leakage of fluid. She states an improvement in her vaginal bleeding- much less than admission and only noted when wiping Patient reports the fetal movement as active. Patient reports uterine contraction  activity as none. Patient reports  vaginal bleeding as scant staining. Patient describes fluid per vagina as Clear.  Vitals:  Blood pressure 108/52, pulse 85, temperature 98.1 F (36.7 C), temperature source Oral, resp. rate 20, height 5' (1.524 m), weight 195 lb (88.451 kg), last menstrual period 11/27/2013. Filed Vitals:   08/02/14 1621 08/02/14 2118 08/03/14 0011  BP: 118/70 115/62 108/52  Pulse: 93 83 85  Temp: 98.8 F (37.1 C) 98.4 F (36.9 C) 98.1 F (36.7 C)  TempSrc: Oral Oral Oral  Resp: 16 20 20   Height:  5' (1.524 m)   Weight:  195 lb (88.451 kg)    Physical Examination:  General appearance - alert, well appearing, and in no distress Fundal Height:  size equals dates Pelvic Exam:  examination not indicated Cervical Exam: Not evaluated.  Abdomen: soft, gravid, non tender Extremities: Homans sign is negative, no sign of DVT with DTRs 2+ bilaterally Membranes: presumed rupured  Fetal Monitoring:  Baseline: 150 bpm, Variability: Good {> 6 bpm), Accelerations: Reactive, Decelerations: Absent and [place]oco: irregular contractions     Labs:  Results for orders placed or performed during the hospital encounter of 08/02/14 (from the past 24 hour(s))  Wet prep, genital   Collection Time: 08/02/14  5:35 PM  Result Value Ref Range   Yeast Wet Prep HPF POC FEW (A) NONE SEEN   Trich, Wet Prep NONE SEEN NONE SEEN   Clue Cells Wet Prep HPF POC NONE SEEN NONE SEEN   WBC, Wet Prep HPF POC NONE SEEN NONE SEEN  Group B strep by PCR   Collection Time: 08/02/14  5:35 PM  Result Value Ref Range   Group B strep by PCR NEGATIVE NEGATIVE  Amnisure rupture of membrane (rom)   Collection Time: 08/02/14  5:45 PM  Result Value Ref Range   Amnisure ROM POSITIVE   CBC with Differential   Collection Time: 08/02/14  6:06 PM  Result Value Ref Range   WBC 8.2 4.0 - 10.5 K/uL   RBC 3.70 (L) 3.87 - 5.11 MIL/uL   Hemoglobin 11.0 (L) 12.0 - 15.0 g/dL   HCT 33.7 (L) 36.0 - 46.0 %   MCV 91.1 78.0 - 100.0 fL   MCH 29.7 26.0 - 34.0 pg   MCHC 32.6 30.0 - 36.0 g/dL   RDW 12.7 11.5 - 15.5 %   Platelets 247 150 - 400 K/uL   Neutrophils Relative % 73 43 - 77 %   Neutro Abs 5.9 1.7 - 7.7 K/uL   Lymphocytes Relative 21 12 - 46 %   Lymphs Abs 1.8 0.7 - 4.0 K/uL   Monocytes Relative 5 3 - 12 %   Monocytes Absolute 0.4 0.1 - 1.0 K/uL   Eosinophils Relative 1 0 - 5 %   Eosinophils Absolute 0.1 0.0 - 0.7 K/uL   Basophils Relative 0 0 - 1 %   Basophils Absolute 0.0 0.0 - 0.1 K/uL  Protime-INR  Collection Time: 08/02/14  6:22 PM  Result Value Ref Range   Prothrombin Time 12.9 11.6 - 15.2 seconds   INR 0.96 0.00 - 1.49  Type and screen   Collection Time: 08/02/14  7:51 PM  Result Value Ref Range   ABO/RH(D) O POS    Antibody Screen NEG    Sample Expiration 08/05/2014   Urine rapid drug screen (hosp performed)   Collection Time: 08/02/14 10:20 PM  Result Value Ref Range   Opiates NONE DETECTED NONE DETECTED   Cocaine NONE DETECTED NONE DETECTED   Benzodiazepines NONE DETECTED NONE DETECTED   Amphetamines NONE DETECTED NONE DETECTED   Tetrahydrocannabinol NONE DETECTED NONE DETECTED   Barbiturates NONE DETECTED NONE DETECTED  Urinalysis, Routine w reflex microscopic   Collection Time: 08/02/14 10:20 PM  Result Value Ref Range   Color, Urine YELLOW YELLOW   APPearance CLEAR CLEAR   Specific Gravity, Urine 1.015  1.005 - 1.030   pH 7.0 5.0 - 8.0   Glucose, UA NEGATIVE NEGATIVE mg/dL   Hgb urine dipstick TRACE (A) NEGATIVE   Bilirubin Urine NEGATIVE NEGATIVE   Ketones, ur NEGATIVE NEGATIVE mg/dL   Protein, ur NEGATIVE NEGATIVE mg/dL   Urobilinogen, UA 0.2 0.0 - 1.0 mg/dL   Nitrite NEGATIVE NEGATIVE   Leukocytes, UA NEGATIVE NEGATIVE  Urine microscopic-add on   Collection Time: 08/02/14 10:20 PM  Result Value Ref Range   Squamous Epithelial / LPF RARE RARE   WBC, UA  <3 WBC/hpf    NO FORMED ELEMENTS SEEN ON URINE MICROSCOPIC EXAMINATION   RBC / HPF 0-2 <3 RBC/hpf   Bacteria, UA FEW (A) RARE  CBC   Collection Time: 08/03/14  5:15 AM  Result Value Ref Range   WBC 9.7 4.0 - 10.5 K/uL   RBC 3.59 (L) 3.87 - 5.11 MIL/uL   Hemoglobin 10.7 (L) 12.0 - 15.0 g/dL   HCT 32.5 (L) 36.0 - 46.0 %   MCV 90.5 78.0 - 100.0 fL   MCH 29.8 26.0 - 34.0 pg   MCHC 32.9 30.0 - 36.0 g/dL   RDW 12.7 11.5 - 15.5 %   Platelets 253 150 - 400 K/uL    Imaging Studies:      Medications:  Scheduled . ampicillin (OMNIPEN) IV  2 g Intravenous Q6H   Followed by  . [START ON 08/04/2014] amoxicillin  500 mg Oral Q8H  . betamethasone acetate-betamethasone sodium phosphate  12 mg Intramuscular Once  . docusate sodium  100 mg Oral Daily  . erythromycin  250 mg Intravenous Q6H   Followed by  . [START ON 08/05/2014] erythromycin  250 mg Oral Q6H  . fluconazole  150 mg Oral Daily  . prenatal multivitamin  1 tablet Oral Q1200  . prenatal multivitamin  1 tablet Oral Q1200   I have reviewed the patient's current medications.  ASSESSMENT: Lorraine [name] [redacted]w[redacted]d Estimated Date of Delivery: 09/27/14  Patient Active Problem List   Diagnosis Date Noted  . Previous cesarean section complicating pregnancy 60/06/9322    Priority: Medium  . Supervision of other normal pregnancy, antepartum 03/02/2014    Priority: Medium  . H/O preterm delivery, currently pregnant 03/02/2014    Priority: Medium  . Vaginal bleeding 08/02/2014  .  Preterm premature rupture of membranes (PPROM) with unknown onset of labor 08/02/2014  . Decreased fetal movement 08/02/2014  . Pyelonephritis 07/12/2014  . Diverticulitis 09/10/2012  . Abdominal pain 09/09/2012  . Nausea and vomiting 09/09/2012  . Dehydration 09/09/2012  . IUD contraception 09/09/2012  PLAN: 28 yo [id number] at [redacted]w[redacted]d admitted with presumed PPROM and vaginal bleeding in 3rd trimester - Continue latency antibiotics - Continue 2nd dose of betamethasone - Given AFI 14, may consider repeating amniosure - If confirmed rupture, delivery at 34 weeks by repeat cesarean section or with any signs of chorioamnionitis  Morgan Keinath 08/03/2014,6:24 AM

## 2014-08-03 NOTE — Progress Notes (Signed)
UR completed 

## 2014-08-03 NOTE — Progress Notes (Signed)
Pt sitting up eating and smiling. Instructed her to call if needs more pain meds.

## 2014-08-03 NOTE — Plan of Care (Signed)
Problem: Consults Goal: Orientation to unit: [place] (smoking, visitation, chaplain services, helpline)  Outcome: Completed/Met Date Met:  08/03/14

## 2014-08-03 NOTE — Progress Notes (Signed)
Patient ID: Lorraine [name], female   DOB: 10-25-1985, 28 y.o.   MRN: [id number]  S:  Called to patient room by RN for report of contractions by patient.  Patient reports feeling intermittent pain occuring every 2-3 minutes.  Reports decreased bleeding.  Denies pain with palpation of abdomen.    O:   Filed Vitals:   08/03/14 1147  BP: 117/61  Pulse: 103  Temp: 98.1 F (36.7 C)  Resp: 20   General - A&Ox3; pt grimacing and breathing heavier with contractions Abdomen - NT with palpation; uterine contractions palpate moderate Pelvis - Negative fluid seen, no bleeding seen Dilation: Closed Effacement (%): Thick Cervical Position: Posterior Presentation: Vertex Exam by:: Sharyon Medicus, CNM  A: 28 yo G5P1122 at [redacted]w[redacted]d wks IUP ? PPROM Preterm Contractions  P: Repeat amnisure Stadol IM for pain Consulted with Dr. Roselie Awkward - agrees with plan of care  Gwen Pounds, CNM

## 2014-08-03 NOTE — Progress Notes (Signed)
I assisted Anderson Malta, RN with explanation of care plan.  [place] Interpreter.

## 2014-08-03 NOTE — Plan of Care (Signed)
Problem: Phase I Progression Outcomes Goal: Diabetic Assessment Outcome: Not Applicable Date Met:  53/01/04

## 2014-08-04 DIAGNOSIS — O2303 Infections of kidney in pregnancy, third trimester: Secondary | ICD-10-CM

## 2014-08-04 DIAGNOSIS — O468X3 Other antepartum hemorrhage, third trimester: Secondary | ICD-10-CM

## 2014-08-04 DIAGNOSIS — O3421 Maternal care for scar from previous cesarean delivery: Secondary | ICD-10-CM

## 2014-08-04 DIAGNOSIS — K5792 Diverticulitis of intestine, part unspecified, without perforation or abscess without bleeding: Secondary | ICD-10-CM

## 2014-08-04 MED ORDER — NIFEDIPINE 10 MG PO CAPS: 10.0000 mg | ORAL_CAPSULE | Freq: Three times a day (TID) | ORAL | Status: DC

## 2014-08-04 MED ORDER — BUTORPHANOL TARTRATE 1 MG/ML IJ SOLN
2.0000 mg | Freq: Once | INTRAMUSCULAR | Status: AC
  Administered 2014-08-04: 2 mg via INTRAVENOUS
  Filled 2014-08-04: qty 2

## 2014-08-04 MED ORDER — NIFEDIPINE 10 MG PO CAPS
10.0000 mg | ORAL_CAPSULE | Freq: Four times a day (QID) | ORAL | Status: DC | PRN
  Administered 2014-08-04: 10 mg via ORAL
  Filled 2014-08-04: qty 1

## 2014-08-04 NOTE — Progress Notes (Signed)
I assisted Dr Roselie Awkward with explanation of care plan.  [place]  Interpreter.

## 2014-08-04 NOTE — Progress Notes (Signed)
I stopped by patient's room to check her needs, by Juliann Mule Interpreter

## 2014-08-04 NOTE — Progress Notes (Signed)
Patient ID: Lorraine [name], female   DOB: 08-Jan-1986, 28 y.o.   MRN: [id number]  Called to patient bedside due to pain.  Having some cramping pain with sharper, more severe pain about every half hour.  No contractions on monitor.  Denies vaginal bleeding.    Dilation: Closed Effacement (%): Thick Cervical Position: Posterior Presentation: Vertex Exam by:: Michaell Grider  Will give procardia to try to decrease cramping.  Lorraine Boston, DO Attending [facility], [place]

## 2014-08-04 NOTE — Progress Notes (Signed)
Patient ID: Lorraine [name], female   DOB: 14-Nov-1985, 28 y.o.   MRN: [id number] [place]) NOTE  Lorraine [name] is a 28 y.o. X8P3825 at [redacted]w[redacted]d by best clinical estimate who is admitted for vaginal bleeding.   Fetal presentation is cephalic. Length of Stay:  2  Days  Subjective: Still light bleeding, occcasional painful contractions Patient reports the fetal movement as active. Patient reports uterine contraction  activity as occasional. Patient reports  vaginal bleeding as spotting. Patient describes fluid per vagina as None.  Vitals:  Blood pressure 110/60, pulse 77, temperature 98.5 F (36.9 C), temperature source Oral, resp. rate 20, height 5' (1.524 m), weight 88.451 kg (195 lb), last menstrual period 11/27/2013, SpO2 96 %. Physical Examination:  General appearance - alert, well appearing, and in no distress Heart - normal rate and regular rhythm Abdomen - soft, nontender, nondistended Fundal Height:  size equals dates Cervical Exam: Not evaluated.  Extremities: extremities normal, atraumatic, no cyanosis or edema and Homans sign is negative, no sign of DVT  Membranes:intact, negative amnisure 11/17  Fetal Heart Rate A      Mode  External [EFM on and rec.] filed at 08/04/2014 0505    Baseline Rate (A)  150 bpm filed at 08/04/2014 0700    Variability  <5 BPM, 6-25 BPM filed at 08/04/2014 0700    Accelerations  None filed at 08/04/2014 0700    Decelerations  None filed at 08/04/2014 0700      Labs:  Results for orders placed or performed during the hospital encounter of 08/02/14 (from the past 24 hour(s))  Amnisure rupture of membrane (rom)   Collection Time: 08/03/14  2:38 PM  Result Value Ref Range   Amnisure ROM NEGATIVE     Imaging Studies:     Currently EPIC will not allow sonographic studies to automatically populate into notes.  In the meantime, copy and paste results into note or free text.  Medications:  Scheduled .  docusate sodium  100 mg Oral Daily  . prenatal multivitamin  1 tablet Oral Q1200   I have reviewed the patient's current medications.  ASSESSMENT: Patient Active Problem List   Diagnosis Date Noted  . Vaginal bleeding in pregnancy 08/03/2014  . Vaginal bleeding during pregnancy, antepartum   . [redacted] weeks gestation of pregnancy   . Obesity affecting pregnancy in third trimester, antepartum   . Vaginal bleeding 08/02/2014  . Preterm premature rupture of membranes (PPROM) with unknown onset of labor 08/02/2014  . Decreased fetal movement 08/02/2014  . Pyelonephritis 07/12/2014  . Previous cesarean section complicating pregnancy 05/39/7673  . Supervision of other normal pregnancy, antepartum 03/02/2014  . H/O preterm delivery, currently pregnant 03/02/2014  . Diverticulitis 09/10/2012  . Abdominal pain 09/09/2012  . Nausea and vomiting 09/09/2012  . Dehydration 09/09/2012  . IUD contraception 09/09/2012    PLAN: Observe for S/sx increased bleeding or PTL  ARNOLD,JAMES 08/04/2014,7:49 AM

## 2014-08-04 NOTE — Plan of Care (Signed)
Problem: Consults Goal: Birthing Suites Patient Information Press F2 to bring up selections list   Pt < [redacted] weeks EGA  Problem: Phase I Progression Outcomes Goal: No significant worsening bleeding/cervix change/vag drainage No significant worsening in vaginal bleeding, cervical change, or vaginal drainage.  Outcome: Progressing Denies bleeding/leaking at this time. Goal: Pain controlled with appropriate interventions Outcome: Progressing  Problem: Phase II Progression Outcomes Goal: Electronic fetal monitoring(Doppler,Continuous,Intermittent) EFM (Doppler, Continuous, Intermittent)  Outcome: Progressing Continuous due to abd. Pain on days and till 1:00 AM 08/04/14. Goal: Tolerating diet Outcome: Progressing Reg. Goal: Mattress in use Mattress in use ( Eggcrate, Med/Surg, Regular, Birthing Suite, Other)  Outcome: Progressing Regular mattress.

## 2014-08-05 DIAGNOSIS — Z3A32 32 weeks gestation of pregnancy: Secondary | ICD-10-CM

## 2014-08-05 DIAGNOSIS — O4690 Antepartum hemorrhage, unspecified, unspecified trimester: Secondary | ICD-10-CM

## 2014-08-05 LAB — AMNISURE RUPTURE OF MEMBRANE (ROM) NOT AT ARMC: Amnisure ROM: NEGATIVE

## 2014-08-05 NOTE — Progress Notes (Signed)
E Royal here to assist with dc instructions to pt in spanish no questions or concerns noted

## 2014-08-05 NOTE — Discharge Summary (Signed)
Physician Discharge Summary  Patient ID: Lorraine [name] MRN: [id number] DOB/AGE: 14-Jul-1986 28 y.o.  Admit date: 08/02/2014 Discharge date: 08/05/2014  Admission Diagnoses:lite vaginal bleeding x 2  Rule out ROM.  Discharge Diagnoses: resolved vaginal bleeding. Membrane rupture , ruled out. Principal Problem:   Vaginal bleeding during pregnancy, antepartum Active Problems:   Previous cesarean section complicating pregnancy   Supervision of other normal pregnancy, antepartum   H/O preterm delivery, currently pregnant   Pyelonephritis   [redacted] weeks gestation of pregnancy   Obesity affecting pregnancy in third trimester, antepartum   Discharged Condition: Stable  Hospital Course: Lorraine [name] is a 28 y.o. Q3F3545 at [redacted]w[redacted]d admitted for vaginal bleeding in setting of likely PPROM. In MAU, went to bathroom and wiped and noted a quarter sized bright red blood clot on toilet paper. Initial amnisure positive, surprisingly, and u/s shows normal fluid volume.  She was admitted and observed closely and treated for possible PPROM.  Exam on 11/17 showed normal secretions, closed long thick cervix, and repeat amnisure negative. She reported further LOF on 08/05/14 and had negative exam and Amnisure.  No furthe bleeding noted, closed cervix on exam, no signs of PTL. Reassuring FHT. She was deemed stable for discharge to home.    Consults: None  Significant Diagnostic Studies: labs:  CBC Latest Ref Rng 08/03/2014 08/02/2014 07/31/2014  WBC 4.0 - 10.5 K/uL 9.7 8.2 10.4  Hemoglobin 12.0 - 15.0 g/dL 10.7(L) 11.0(L) 12.3  Hematocrit 36.0 - 46.0 % 32.5(L) 33.7(L) 36.3  Platelets 150 - 400 K/uL 253 247 274    Treatments: Procardia, BMZ  Disposition: 01-Home or Self Care      Discharge Instructions    Call MD for:  severe uncontrolled pain    Complete by:  As directed      Diet - low sodium heart healthy    Complete by:  As directed      Discharge instructions    Complete by:  As  directed   Resume care at your regular provider     Increase activity slowly    Complete by:  As directed      Sexual Activity Restrictions    Complete by:  As directed   None x 2 wk            Medication List    TAKE these medications        HYDROmorphone 2 MG tablet  Commonly known as:  DILAUDID  Take 1 tablet (2 mg total) by mouth every 6 (six) hours as needed for severe pain.     multivitamin-prenatal 27-0.8 MG Tabs tablet  Take 1 tablet by mouth daily at 12 noon.     NIFEdipine 10 MG capsule  Commonly known as:  PROCARDIA  Take 1 capsule (10 mg total) by mouth every 8 (eight) hours as needed.       Follow-up Information    [facility] In 1 week.   Why:  If symptoms worsen      Signed: Delia Slatten A 08/05/2014, 3:16 PM

## 2014-08-05 NOTE — Progress Notes (Signed)
I assisted Dr Heide Spark and nurse with a procedure, by Juliann Mule, Interpreter

## 2014-08-05 NOTE — Progress Notes (Signed)
[facility] Attending Note  Subjective:  Called to evaluate patient with abdominal pain and possible further LOF after evaluation and discharge earlier today by Dr. Glo Herring. Patient's earlier recorded contractions not consistent with anything palpated on exam.  FHR reassuring, no vaginal bleeding. Good FM.  Patient is Spanish-speaking only, Spanish interpreter present for this encounter.  Admitted on 08/02/2014 for Vaginal bleeding during pregnancy, antepartum, ?PPROM.  Positive Amnisure on admission on 08/02/24, negative on 08/03/14   Objective:  Blood pressure 116/69, pulse 82, temperature 98.1 F (36.7 C), temperature source Oral, resp. rate 24, height 5' (1.524 m), weight 197 lb 14.4 oz (89.767 kg), last menstrual period 11/27/2013, SpO2 96 %. FHT  Baseline 155 bpm, moderate variability, +accelerations, no decelerations Toco: irregular Abdomen: NT gravid fundus, soft Pelvic: No pooling in vagina, no blood seen. Amnisure sample obtained. Cervix closed/thick/long  Assessment & Plan:  28 y.o. B2I2035 at [redacted]w[redacted]d admitted for vaginal bleeding, ?PPROM Will collect another Aminsure sample today and follow up results. No evidence of PPROM or PTL on exam, no bleeding. If Amnisure is negative, PPROM will effectively be ruled out and patient will be discharged to home as originally planned. Continue close observation for now.  Verita Schneiders, MD, [place] Attending [place], [facility]

## 2014-08-05 NOTE — Progress Notes (Signed)
Pt with no ctx palpated or on monitor while RN in room. Pt with activity on toco while alone in room. Not characteristic of contraction. RN observed pt pushing on toco when returning to room. No ctx palpated. One ctx with exam by Dr Glo Herring at [phone].

## 2014-08-05 NOTE — Progress Notes (Signed)
Pt states pain starts at the top and goes to the bottom of stomach has pressure and feels like contractions and has a lot of pressure

## 2014-08-05 NOTE — Progress Notes (Signed)
Patient ID: Lorraine [name], female   DOB: 22-Jan-1986, 28 y.o.   MRN: [id number] [place]) NOTE  Lorraine [name] is a 28 y.o. P3X9024 at [redacted]w[redacted]d by early ultrasound who is admitted for 2 small episodes of light bleeding when she wipes. On admit, she was tested for ROM , had + amnisure, and was rechecked as amnisure negative. U/S shows normal fluid volume.   Fetal presentation is cephalic. Length of Stay:  3  Days  Subjective: The patient notes occasional mild upper abdominal discomfort which she describes as tightening. Patient reports the fetal movement as active. Patient reports uterine contraction  activity as irregular, every 30 minutes. Patient reports  vaginal bleeding as none.at this time Patient describes fluid per vagina as None.  Vitals:  Blood pressure 100/55, pulse 80, temperature 98.4 F (36.9 C), temperature source Oral, resp. rate 20, height 5' (1.524 m), weight 89.767 kg (197 lb 14.4 oz), last menstrual period 11/27/2013, SpO2 96 %. Physical Examination:  General appearance - alert, well appearing, and in no distress and anxious Heart - normal rate and regular rhythm Abdomen - soft, nontender, nondistended Fundal Height:  size equals dates Cervical Exam: sterile speculum and found to be long closed with normal vaginal secretions/ Long/Floating and fetal presentation is cephalic. Extremities: extremities normal, atraumatic, no cyanosis or edema and Homans sign is negative, no sign of DVT with DTRs 2+ bilaterally Membranes:intact  Fetal Monitoring:  Baseline: 145 bpm and Variability: Fair (1-6 bpm)   contractions not seen on efm no decels Labs:  Results for orders placed or performed during the hospital encounter of 08/02/14 (from the past 24 hour(s))  Type and screen   Collection Time: 08/05/14 12:10 AM  Result Value Ref Range   ABO/RH(D) O POS    Antibody Screen NEG    Sample Expiration 08/08/2014     Imaging Studies:     Currently EPIC will not allow sonographic studies to automatically populate into notes.  In the meantime, copy and paste results into note or free text.  Medications:  Scheduled . docusate sodium  100 mg Oral Daily  . prenatal multivitamin  1 tablet Oral Q1200   I have reviewed the patient's current medications.  ASSESSMENT: Patient Active Problem List   Diagnosis Date Noted  . Vaginal bleeding in pregnancy 08/03/2014  . Vaginal bleeding during pregnancy, antepartum   . [redacted] weeks gestation of pregnancy   . Obesity affecting pregnancy in third trimester, antepartum   . Vaginal bleeding 08/02/2014  . Preterm premature rupture of membranes (PPROM) with unknown onset of labor 08/02/2014  . Decreased fetal movement 08/02/2014  . Pyelonephritis 07/12/2014  . Previous cesarean section complicating pregnancy 09/73/5329  . Supervision of other normal pregnancy, antepartum 03/02/2014  . H/O preterm delivery, currently pregnant 03/02/2014  . Diverticulitis 09/10/2012  . Abdominal pain 09/09/2012  . Nausea and vomiting 09/09/2012  . Dehydration 09/09/2012  . IUD contraception 09/09/2012    PLAN: Continuous monitor this a.m. Consider discharge today.  Draco Malczewski V 08/05/2014,5:44 AM

## 2014-08-05 NOTE — Discharge Instructions (Signed)
Informacin sobre el parto prematuro  (Preterm Labor Information) El parto prematuro comienza antes de la semana 37 de embarazo. La duracin de un embarazo normal es de 39 a 41 semanas.  CAUSAS  Generalmente no hay una causa que pueda identificarse del motivo por el que una mujer comienza un trabajo de parto prematuro. Sin embargo, una de las causas conocidas ms frecuentes son las infecciones. Las infecciones del tero, el cuello, la vagina, el lquido amnitico, la vejiga, los riones y hasta de los pulmones (neumona) pueden hacer que el trabajo de parto se inicie. Otras causas que pueden sospecharse son:   Infecciones urogenitales, como infecciones por hongos y vaginosis bacteriana.   Anormalidades uterinas (forma del tero, sptum uterino, fibromas, hemorragias en la placenta).   Un cuello que ha sido operado (puede ser que no permanezca cerrado).   Malformaciones del feto.   Gestaciones mltiples (mellizos, trillizos y ms).   Ruptura del saco amnitico.  FACTORES DE RIESGO   Historia previa de parto prematuro.   Tener ruptura prematura de las membranas (RPM).   La placenta cubre la abertura del cuello (placenta previa).   La placenta se separa del tero (abrupcin placentaria).   El cuello es demasiado dbil para contener al beb en el tero (cuello incompetente).   Hay mucho lquido en el saco amnitico (polihidramnios).   Consumo de drogas o hbito de fumar durante el embarazo.   No aumentar de peso lo suficiente durante el embarazo.   Mujeres menores de 18 aos o mayores de 35 aos.   Nivel socioeconmico bajo.   Pertenecer a la raza afroamericana. SNTOMAS  Los signos y sntomas del trabajo de parto prematuro son:   Clicos similares a los menstruales, dolor abdominal o dolor de espalda.  Contracciones uterinas regulares, tan frecuentes como seis por hora, sin importar su intensidad (pueden ser suaves o dolorosas).  Contracciones que comienzan  en la parte superior del tero y se expanden hacia abajo, a la zona inferior del abdomen y la espalda.   Sensacin de aumento de presin en la pelvis.   Aparece una secrecin acuosa o sanguinolenta por la vagina.  TRATAMIENTO  Segn el tiempo del embarazo y otras circunstancias, el mdico puede indicar reposo en cama. Si es necesario, le indicarn medicamentos para detener las contracciones y para madurar los pulmones del feto. Si el trabajo de parto se inicia antes de las 34 semanas de embarazo, se recomienda la hospitalizacin. El tratamiento depende de las condiciones en que se encuentren usted y el feto.  QU DEBE HACER SI PIENSA QUE EST EN TRABAJO DE PARTO PREMATURO?  Comunquese con su mdico inmediatamente. Debe concurrir al hospital para ser controlada inmediatamente.  CMO PUEDE EVITAR EL TRABAJO DE PARTO PREMATURO EN FUTUROS EMBARAZOS?  Usted debe:   Si fuma, abandonar el hbito.  Mantener un peso saludable y evitar sustancias qumicas y drogas innecesarias.  Controlar todo tipo de infeccin.  Informe a su mdico si tiene una historia conocida de trabajo de parto prematuro. Document Released: 12/11/2007 Document Revised: 05/06/2013 ExitCare Patient Information 2015 [employer]. This information is not intended to replace advice given to you by your health care provider. Make sure you discuss any questions you have with your health care provider.  

## 2014-08-06 ENCOUNTER — Telehealth: Payer: Self-pay | Admitting: *Deleted

## 2014-08-06 ENCOUNTER — Inpatient Hospital Stay (HOSPITAL_COMMUNITY)
Admission: AD | Admit: 2014-08-06 | Discharge: 2014-08-08 | DRG: 782 | Disposition: A | Discharge status: Home / Self Care | Payer: Self-pay | Source: Ambulatory Visit | Attending: Obstetrics and Gynecology | Admitting: Obstetrics and Gynecology

## 2014-08-06 ENCOUNTER — Encounter (HOSPITAL_COMMUNITY): Payer: Self-pay | Admitting: *Deleted

## 2014-08-06 DIAGNOSIS — R58 Hemorrhage, not elsewhere classified: Secondary | ICD-10-CM

## 2014-08-06 DIAGNOSIS — Z3483 Encounter for supervision of other normal pregnancy, third trimester: Secondary | ICD-10-CM

## 2014-08-06 DIAGNOSIS — Z833 Family history of diabetes mellitus: Secondary | ICD-10-CM

## 2014-08-06 DIAGNOSIS — O09212 Supervision of pregnancy with history of pre-term labor, second trimester: Secondary | ICD-10-CM

## 2014-08-06 DIAGNOSIS — O3421 Maternal care for scar from previous cesarean delivery: Secondary | ICD-10-CM

## 2014-08-06 DIAGNOSIS — Z3A32 32 weeks gestation of pregnancy: Secondary | ICD-10-CM | POA: Diagnosis present

## 2014-08-06 DIAGNOSIS — O4693 Antepartum hemorrhage, unspecified, third trimester: Secondary | ICD-10-CM

## 2014-08-06 DIAGNOSIS — O09892 Supervision of other high risk pregnancies, second trimester: Secondary | ICD-10-CM

## 2014-08-06 DIAGNOSIS — O34219 Maternal care for unspecified type scar from previous cesarean delivery: Secondary | ICD-10-CM

## 2014-08-06 LAB — CBC
HCT: 32.8 % — ABNORMAL LOW (ref 36.0–46.0)
Hemoglobin: 11 g/dL — ABNORMAL LOW (ref 12.0–15.0)
MCH: 30.7 pg (ref 26.0–34.0)
MCHC: 33.5 g/dL (ref 30.0–36.0)
MCV: 91.6 fL (ref 78.0–100.0)
PLATELETS: 236 10*3/uL (ref 150–400)
RBC: 3.58 MIL/uL — ABNORMAL LOW (ref 3.87–5.11)
RDW: 13 % (ref 11.5–15.5)
WBC: 11.2 10*3/uL — AB (ref 4.0–10.5)

## 2014-08-06 LAB — URINE MICROSCOPIC-ADD ON

## 2014-08-06 LAB — TYPE AND SCREEN
ABO/RH(D): O POS
ABO/RH(D): O POS
ANTIBODY SCREEN: NEGATIVE
Antibody Screen: NEGATIVE

## 2014-08-06 LAB — URINALYSIS, ROUTINE W REFLEX MICROSCOPIC
Bilirubin Urine: NEGATIVE
Glucose, UA: NEGATIVE mg/dL
Ketones, ur: NEGATIVE mg/dL
LEUKOCYTES UA: NEGATIVE
Nitrite: NEGATIVE
Protein, ur: NEGATIVE mg/dL
SPECIFIC GRAVITY, URINE: 1.015 (ref 1.005–1.030)
Urobilinogen, UA: 0.2 mg/dL (ref 0.0–1.0)
pH: 7 (ref 5.0–8.0)

## 2014-08-06 MED ORDER — ACETAMINOPHEN 325 MG PO TABS: 650.0000 mg | ORAL_TABLET | ORAL | Status: DC | PRN

## 2014-08-06 MED ORDER — PRENATAL MULTIVITAMIN CH
1.0000 | ORAL_TABLET | Freq: Every day | ORAL | Status: DC
  Administered 2014-08-07: 1 via ORAL
  Filled 2014-08-06: qty 1

## 2014-08-06 MED ORDER — DOCUSATE SODIUM 100 MG PO CAPS
100.0000 mg | ORAL_CAPSULE | Freq: Every day | ORAL | Status: DC
  Administered 2014-08-07: 100 mg via ORAL
  Filled 2014-08-06: qty 1

## 2014-08-06 MED ORDER — CALCIUM CARBONATE ANTACID 500 MG PO CHEW: 2.0000 | CHEWABLE_TABLET | ORAL | Status: DC | PRN

## 2014-08-06 MED ORDER — SODIUM CHLORIDE 0.9 % IJ SOLN
3.0000 mL | Freq: Two times a day (BID) | INTRAMUSCULAR | Status: DC
  Administered 2014-08-06 – 2014-08-07 (×4): 3 mL via INTRAVENOUS

## 2014-08-06 MED ORDER — HYDROMORPHONE HCL 2 MG PO TABS
2.0000 mg | ORAL_TABLET | Freq: Four times a day (QID) | ORAL | Status: DC | PRN
  Administered 2014-08-06 – 2014-08-07 (×3): 2 mg via ORAL
  Filled 2014-08-06 (×3): qty 1

## 2014-08-06 MED ORDER — SODIUM CHLORIDE 0.9 % IV SOLN: 250.0000 mL | INTRAVENOUS | Status: DC | PRN

## 2014-08-06 MED ORDER — NIFEDIPINE 10 MG PO CAPS
10.0000 mg | ORAL_CAPSULE | Freq: Three times a day (TID) | ORAL | Status: DC | PRN
Start: 2014-08-06 — End: 2014-08-07
  Administered 2014-08-06 – 2014-08-07 (×3): 10 mg via ORAL
  Filled 2014-08-06 (×3): qty 1

## 2014-08-06 MED ORDER — SODIUM CHLORIDE 0.9 % IJ SOLN: 3.0000 mL | INTRAMUSCULAR | Status: DC | PRN

## 2014-08-06 MED ORDER — ZOLPIDEM TARTRATE 5 MG PO TABS
5.0000 mg | ORAL_TABLET | Freq: Every evening | ORAL | Status: DC | PRN
  Administered 2014-08-07: 5 mg via ORAL
  Filled 2014-08-06: qty 1

## 2014-08-06 NOTE — Progress Notes (Signed)
Pt stating through the interpreter that she id leaking vaginal discharge/fluid from vagina.  Pt at current doesn't have a pad on and no fluid noted at perineum.  Pt instructed to empty bladder and start wearing a pad so that the nurse can monitor.

## 2014-08-06 NOTE — MAU Note (Signed)
Pt presents to MAU with complaints of bright red vaginal bleeding starting this morning and it was like her menstrual cycle but now is only having vaginal spotting. States that she was discharged yesterday from antenatal and has not got her meds filled to take for the contractions

## 2014-08-06 NOTE — Plan of Care (Signed)
Problem: Consults Goal: Antepartum Patient Education Outcome: Completed/Met Date Met:  08/06/14 Goal: Birthing Suites Patient Information Press F2 to bring up selections list  Pt < [redacted] weeks EGA Goal: Skin Care Protocol Initiated - if Braden Score 18 or less If consults are not indicated, leave blank or document N/A Outcome: Completed/Met Date Met:  08/06/14 Goal: Case Manager Consult Outcome: Not Applicable Date Met:  63/84/66

## 2014-08-06 NOTE — Progress Notes (Signed)
I assisted Pam, RN with questions. [place]  Interpreter.

## 2014-08-06 NOTE — Telephone Encounter (Addendum)
Pt left message in Spanish. Interpreter - Larkin Ina listened to message and stated that pt reports she is having some light bleeding and wants to know if she needs to come to the hospital. Pt also stated that she was discharged from the hospital earlier this week. Per chart review, she is currently receiving evaluation @ Maternity Admissions. No return call to pt will be needed.

## 2014-08-06 NOTE — H&P (Signed)
Lorraine [name] is a G14P1122 28 y.o. female with hx of 2 Caesarian sections presenting for vaginal bleeding with EGA of [redacted]w[redacted]d. Patient was recently admitted on 11/16 to antenatal unit at [facility] and discharged on 11/19 for vaginal bleeding.During the course of her hospital stay, patient had an initial positive amnisure and u/s was normal. She was admitted and monitored for possible PPROM. Patient received 2 doses of betamethasone. Repeat Amnisure on 11/17 and 11/19 were negatve. FHT were reassuring. Patient was given nicardipine and hydromorphone, however patient did not fill the prescription.   Patient states the vaginal bleeding started this morning and was similar to her normal periods but now spotting. She states the blood was bright red and dark in nature.  She states she is having irregular, strong contractions that are about 7-8 min apart.   E Royal was utilized as an Astronomer for this patient interaction.   Clinic [facility]  Dating first trimester ultrasound  Genetic Screen declined   Anatomic [place] Normal growth 62nd%ile. rec serial sonos for growth  GTT Early: 123 Third trimester: 125  TDaP vaccine 06/29/14  Flu vaccine Declined 06/29/14, then agreed to 07/19/14  GBS   Contraception IUD  [employer] breast  Circumcision Boy, outpt circ  Pediatrician   Support Person      Maternal Medical History:  Reason for admission: Vaginal bleeding.   Contractions: Frequency: irregular.   Perceived severity is strong.    Fetal activity: Perceived fetal activity is normal.    Prenatal complications: Bleeding.     OB History    Gravida Para Term Preterm AB TAB SAB Ectopic Multiple Living   5 2 1 1 2  2   2      Past Medical History  Diagnosis Date  . Diverticulitis    Past Surgical History  Procedure Laterality Date  . Cholecystectomy    . Cesarean section     Family History: family history includes Diabetes in her maternal uncle and  paternal grandmother; Hyperlipidemia in her mother. Social History:  reports that she has never smoked. She has never used smokeless tobacco. She reports that she does not drink alcohol or use illicit drugs.   Prenatal Transfer Tool  Maternal Diabetes: No Genetic Screening: Declined Maternal Ultrasounds/Referrals: Normal Fetal Ultrasounds or other Referrals:  None Maternal Substance Abuse:  No Significant Maternal Medications:  None Significant Maternal Lab Results:  None Other Comments:  None  Review of Systems  Constitutional: Negative for fever and chills.  Respiratory: Positive for shortness of breath.   Cardiovascular: Positive for chest pain and leg swelling.       Pressure like chest pain in sternum area that does not radiate anywhere.   Gastrointestinal: Positive for abdominal pain.       Upper quadrant pain that radiates down   Neurological: Positive for tingling and headaches.       Numbness and tingling in hands and feet       Blood pressure 122/71, pulse 91, temperature 98.2 F (36.8 C), resp. rate 18, height 5' (1.524 m), weight 90.266 kg (199 lb), last menstrual period 11/27/2013. Maternal Exam:  Abdomen: Patient reports generalized tenderness.  Introitus: Normal vulva. Vagina is positive for vaginal discharge.  Ferning test: not done.  Nitrazine test: not done. Amniotic fluid character: not assessed. No vaginal bleeding    Cervix: Cervix evaluated by sterile speculum exam and digital exam.     Physical Exam  Constitutional: She is oriented to person, place, and time. She appears well-developed  and well-nourished.  HENT:  Head: Normocephalic.  Neck: Normal range of motion.  Cardiovascular: Normal rate, regular rhythm and normal heart sounds.   Respiratory: Effort normal and breath sounds normal. No respiratory distress.  GI: Soft. Bowel sounds are normal. There is generalized tenderness.  Genitourinary: Vaginal discharge found.  Neurological: She is  alert and oriented to person, place, and time.  Skin: Skin is warm and dry.  No blood noted on spec exam; cx closed/thick FHR 150s, +accels, no decels, occ mi variables Irreg ctx- no pattern  Prenatal labs: ABO, Rh: --/--/O POS (11/19 0010) Antibody: NEG (11/19 0010) Rubella: 17.40 (06/16 0836) RPR: NON REAC (10/13 1653)  HBsAg: NEGATIVE (06/16 0836)  HIV: NONREACTIVE (10/26 1447)  GBS: Negative (06/16 0000)   Assessment/Plan: IUP@32 .4wks Pt report of vag bldg  Admit to Antenatal Already rec'd BMZ Continuous monitoring and pad count Consider a 7-day admission  Ervin Knack 08/06/2014, 9:56 AM   I have seen and examined this patient and I agree with the above. Serita Grammes CNM 12:06 AM 08/07/2014

## 2014-08-07 ENCOUNTER — Inpatient Hospital Stay (HOSPITAL_COMMUNITY): Payer: Medicaid Other

## 2014-08-07 DIAGNOSIS — O3421 Maternal care for scar from previous cesarean delivery: Secondary | ICD-10-CM

## 2014-08-07 DIAGNOSIS — G629 Polyneuropathy, unspecified: Secondary | ICD-10-CM

## 2014-08-07 DIAGNOSIS — Z3A32 32 weeks gestation of pregnancy: Secondary | ICD-10-CM

## 2014-08-07 DIAGNOSIS — O468X3 Other antepartum hemorrhage, third trimester: Secondary | ICD-10-CM

## 2014-08-07 DIAGNOSIS — O99213 Obesity complicating pregnancy, third trimester: Secondary | ICD-10-CM

## 2014-08-07 LAB — AMNISURE RUPTURE OF MEMBRANE (ROM) NOT AT ARMC: Amnisure ROM: NEGATIVE

## 2014-08-07 MED ORDER — OXYCODONE-ACETAMINOPHEN 5-325 MG PO TABS
1.0000 | ORAL_TABLET | Freq: Four times a day (QID) | ORAL | Status: DC | PRN
  Administered 2014-08-07 – 2014-08-08 (×4): 2 via ORAL
  Filled 2014-08-07 (×4): qty 2

## 2014-08-07 NOTE — Plan of Care (Signed)
Problem: Consults Goal: Orientation to unit: [place] (smoking, visitation, chaplain services, helpline)  Outcome: Completed/Met Date Met:  08/07/14

## 2014-08-07 NOTE — Progress Notes (Signed)
Pt sleeping soundly when RN entered room in no apparent distress. Rn had to awaken pt to remove monitors pt them started moaning like she was in pain.

## 2014-08-07 NOTE — Progress Notes (Signed)
Patient ID: Lorraine [name], female   DOB: [birth year], 28 y.o.   MRN: [id number] [place]) NOTE  Lorraine [name] is a 28 y.o. [id number] at [redacted]w[redacted]d  who is admitted for vaginal bleeding/suspected abruption.   Length of Stay:  1  Days  Subjective: Pt c/o continue abdominal pain Patient reports the fetal movement as active. Patient reports uterine contraction  activity as irregular, every 10-20 minutes. Patient reports  vaginal bleeding as scant staining. Patient describes fluid per vagina as None.  Vitals:  Blood pressure 111/61, pulse 109, temperature 98.5 F (36.9 C), temperature source Oral, resp. rate 20, height 5' (1.524 m), weight 199 lb (90.266 kg), last menstrual period 11/27/2013. Physical Examination:  General appearance - alert, well appearing, and in mild distress Abdomen - gravid, mild diffuse tenderness, no rebound, no gurading Extremities - Homan's sign negative bilaterally Cervix - closed/60/-2  Fetal Monitoring:  Baseline: 150 bpm, Variability: Good {> 6 bpm), Accelerations: Reactive and Decelerations: Absent  Labs:  Results for orders placed or performed during the hospital encounter of 08/06/14 (from the past 24 hour(s))  Urinalysis, Routine w reflex microscopic   Collection Time: 08/06/14  8:10 AM  Result Value Ref Range   Color, Urine YELLOW YELLOW   APPearance CLEAR CLEAR   Specific Gravity, Urine 1.015 1.005 - 1.030   pH 7.0 5.0 - 8.0   Glucose, UA NEGATIVE NEGATIVE mg/dL   Hgb urine dipstick MODERATE (A) NEGATIVE   Bilirubin Urine NEGATIVE NEGATIVE   Ketones, ur NEGATIVE NEGATIVE mg/dL   Protein, ur NEGATIVE NEGATIVE mg/dL   Urobilinogen, UA 0.2 0.0 - 1.0 mg/dL   Nitrite NEGATIVE NEGATIVE   Leukocytes, UA NEGATIVE NEGATIVE  Urine microscopic-add on   Collection Time: 08/06/14  8:10 AM  Result Value Ref Range   Squamous Epithelial / LPF FEW (A) RARE   RBC / HPF 0-2 <3 RBC/hpf   Bacteria, UA RARE RARE  CBC on admission    Collection Time: 08/06/14 10:12 AM  Result Value Ref Range   WBC 11.2 (H) 4.0 - 10.5 K/uL   RBC 3.58 (L) 3.87 - 5.11 MIL/uL   Hemoglobin 11.0 (L) 12.0 - 15.0 g/dL   HCT 32.8 (L) 36.0 - 46.0 %   MCV 91.6 78.0 - 100.0 fL   MCH 30.7 26.0 - 34.0 pg   MCHC 33.5 30.0 - 36.0 g/dL   RDW 13.0 11.5 - 15.5 %   Platelets 236 150 - 400 K/uL  Type and screen   Collection Time: 08/06/14  2:30 PM  Result Value Ref Range   ABO/RH(D) O POS    Antibody Screen NEG    Sample Expiration 08/09/2014     Medications:  Scheduled . docusate sodium  100 mg Oral Daily  . prenatal multivitamin  1 tablet Oral Q1200  . sodium chloride  3 mL Intravenous Q12H   I have reviewed the patient's current medications.  ASSESSMENT: Patient Active Problem List   Diagnosis Date Noted  . Third trimester bleeding, antepartum 08/06/2014  . Vaginal bleeding during pregnancy, antepartum   . [redacted] weeks gestation of pregnancy   . Obesity affecting pregnancy in third trimester, antepartum   . Pyelonephritis 07/12/2014  . Previous cesarean section complicating pregnancy 24/05/7352  . Supervision of other normal pregnancy, antepartum 03/02/2014  . H/O preterm delivery, currently pregnant 03/02/2014  . Diverticulitis 09/10/2012  . Abdominal pain 09/09/2012  . Nausea and vomiting 09/09/2012  . Dehydration 09/09/2012  . IUD contraception 09/09/2012    PLAN: Lorraine [name]  is a 28 y.o. W7P7106 at [redacted]w[redacted]d  who is admitted for vaginal bleeding/suspected abruption.   Pt was recently discharged and was readmitted.  No blood seen on this admission.  Unsure o f pain.  ?early labor.  Procardia d/c due suspected abruption  Percocet for pain [place] to determine etiology of pain. Betamethasone already given with last admission Repeat amniosure  LEGGETT,KELLY H. 08/07/2014,5:43 AM

## 2014-08-07 NOTE — Progress Notes (Signed)
Pt asking to eat breakfast.

## 2014-08-08 MED ORDER — GI COCKTAIL ~~LOC~~
30.0000 mL | Freq: Once | ORAL | Status: AC
  Administered 2014-08-08: 30 mL via ORAL
  Filled 2014-08-08: qty 30

## 2014-08-08 MED ORDER — OXYCODONE-ACETAMINOPHEN 5-325 MG PO TABS: 1.0000 | ORAL_TABLET | Freq: Four times a day (QID) | ORAL | Status: DC | PRN

## 2014-08-08 NOTE — Progress Notes (Signed)
Req interpreter to help with pts D/C home

## 2014-08-08 NOTE — Progress Notes (Signed)
Patient ID: Lorraine [name], female   DOB: July 04, 1986, 28 y.o.   MRN: [id number] [place]) NOTE  Lorraine [name] is a 28 y.o. [id number] at [redacted]w[redacted]d, who is admitted for abdominal pain, alleged bleeding at home, not confirmed on evaluation here, [place] 11/21.   Fetal presentation is cephalic. Length of Stay:  2  Days  Subjective: PT Slept thru the night after percocet at [place], but again complains of discomfort , will again monitor: The pain is primarily in the LUQ, constant, steady, not improved by position changes, or by walking, or by food consumption.  I will try tx with GI cocktail this a.m. Patient reports the fetal movement as active. Patient reports uterine contraction  activity as none. Patient reports  vaginal bleeding as none. Patient describes fluid per vagina as None. AMNISURE YESTERDAY NEGATIVE  U/s : NEGATIVE FOR BLEEDING OR ABRUPTION. Vitals:  Blood pressure 120/61, pulse 110, temperature 98.3 F (36.8 C), temperature source Oral, resp. rate 18, height 5' (1.524 m), weight 90.266 kg (199 lb), last menstrual period 11/27/2013. Physical Examination:  General appearance - oriented to person, place, and time, overweight, ill-appearing and GRIMMACING, due to discomfort in LUQ primarily, some bilateral LQ pain consistent with round ligament and pelvic support pain Heart - normal rate and regular rhythm Abdomen - soft, nontender, nondistended Fundal Height:  size equals dates Cervical Exam: Not evaluated. and yesterday was found to be closed and fetal presentation is cephalic. Extremities: extremities normal, atraumatic, no cyanosis or edema and Homans sign is negative, no sign of DVT with DTRs 2+ bilaterally Membranes:intact No bleeding noted by nursing in 24 hours. Fetal Monitoring:  To repeat now  Labs:  Results for orders placed or performed during the hospital encounter of 08/06/14 (from the past 24 hour(s))  Amnisure rupture of  membrane (rom)   Collection Time: 08/07/14  8:00 AM  Result Value Ref Range   Amnisure ROM NEGATIVE    Urine dipstick shows negative for all components.  Micro exam: negative for WBC's or RBC's and rare+ bacteria.  Imaging Studies:    NO evidence of abruption or pl. Previa. Medications:  Scheduled . docusate sodium  100 mg Oral Daily  . prenatal multivitamin  1 tablet Oral Q1200  . sodium chloride  3 mL Intravenous Q12H   I have reviewed the patient's current medications.  ASSESSMENT: Chronic abdominal LUQ pain, not felt related to pregnancy, No evidence of bleeding or obstetric problem   Patient Active Problem List   Diagnosis Date Noted  . Third trimester bleeding, antepartum 08/06/2014  . Vaginal bleeding during pregnancy, antepartum   . [redacted] weeks gestation of pregnancy   . Obesity affecting pregnancy in third trimester, antepartum   . Pyelonephritis 07/12/2014  . Previous cesarean section complicating pregnancy 66/29/4765  . Supervision of other normal pregnancy, antepartum 03/02/2014  . H/O preterm delivery, currently pregnant 03/02/2014  . Diverticulitis 09/10/2012  . Abdominal pain 09/09/2012  . Nausea and vomiting 09/09/2012  . Dehydration 09/09/2012  . IUD contraception 09/09/2012    PLAN: GI cocktail given , monitoring restarted, will reassess 1-2 hours,  Currently I feel outpt care indicated.  Maxximus Gotay V 08/08/2014,5:51 AM

## 2014-08-08 NOTE — Progress Notes (Signed)
Checked up on patient needs.  [place]

## 2014-08-08 NOTE — Progress Notes (Signed)
Checked on patients needs and ordered dinner and breakfast. Lorraine [name]

## 2014-08-08 NOTE — Progress Notes (Signed)
Assisted RN with interpretation of patient evaluation.  [place]

## 2014-08-13 ENCOUNTER — Encounter (HOSPITAL_COMMUNITY): Payer: Self-pay

## 2014-08-13 ENCOUNTER — Inpatient Hospital Stay (HOSPITAL_COMMUNITY)
Admission: AD | Admit: 2014-08-13 | Discharge: 2014-08-14 | Disposition: A | Discharge status: Home / Self Care | Payer: Medicaid Other | Source: Ambulatory Visit | Attending: Obstetrics & Gynecology | Admitting: Obstetrics & Gynecology

## 2014-08-13 DIAGNOSIS — R0789 Other chest pain: Secondary | ICD-10-CM | POA: Insufficient documentation

## 2014-08-13 DIAGNOSIS — O4703 False labor before 37 completed weeks of gestation, third trimester: Secondary | ICD-10-CM

## 2014-08-13 DIAGNOSIS — J9801 Acute bronchospasm: Secondary | ICD-10-CM | POA: Insufficient documentation

## 2014-08-13 DIAGNOSIS — Z3483 Encounter for supervision of other normal pregnancy, third trimester: Secondary | ICD-10-CM

## 2014-08-13 DIAGNOSIS — R062 Wheezing: Secondary | ICD-10-CM

## 2014-08-13 DIAGNOSIS — O09212 Supervision of pregnancy with history of pre-term labor, second trimester: Secondary | ICD-10-CM

## 2014-08-13 DIAGNOSIS — O26893 Other specified pregnancy related conditions, third trimester: Secondary | ICD-10-CM | POA: Insufficient documentation

## 2014-08-13 DIAGNOSIS — O34219 Maternal care for unspecified type scar from previous cesarean delivery: Secondary | ICD-10-CM

## 2014-08-13 DIAGNOSIS — Z3A33 33 weeks gestation of pregnancy: Secondary | ICD-10-CM | POA: Insufficient documentation

## 2014-08-13 DIAGNOSIS — O09892 Supervision of other high risk pregnancies, second trimester: Secondary | ICD-10-CM

## 2014-08-13 LAB — URINALYSIS, ROUTINE W REFLEX MICROSCOPIC
BILIRUBIN URINE: NEGATIVE
Glucose, UA: NEGATIVE mg/dL
HGB URINE DIPSTICK: NEGATIVE
Ketones, ur: NEGATIVE mg/dL
Leukocytes, UA: NEGATIVE
Nitrite: NEGATIVE
PROTEIN: NEGATIVE mg/dL
Specific Gravity, Urine: 1.02 (ref 1.005–1.030)
Urobilinogen, UA: 0.2 mg/dL (ref 0.0–1.0)
pH: 6.5 (ref 5.0–8.0)

## 2014-08-13 MED ORDER — ACETAMINOPHEN 500 MG PO TABS
1000.0000 mg | ORAL_TABLET | Freq: Once | ORAL | Status: AC
  Administered 2014-08-13: 1000 mg via ORAL
  Filled 2014-08-13: qty 2

## 2014-08-13 MED ORDER — ALBUTEROL SULFATE (2.5 MG/3ML) 0.083% IN NEBU
2.5000 mg | INHALATION_SOLUTION | Freq: Once | RESPIRATORY_TRACT | Status: AC
  Administered 2014-08-13: 2.5 mg via RESPIRATORY_TRACT
  Filled 2014-08-13: qty 3

## 2014-08-13 MED ORDER — SODIUM CHLORIDE 0.9 % IV BOLUS (SEPSIS)
1000.0000 mL | Freq: Once | INTRAVENOUS | Status: AC
  Administered 2014-08-13: 1000 mL via INTRAVENOUS

## 2014-08-13 MED ORDER — NIFEDIPINE 10 MG PO CAPS
20.0000 mg | ORAL_CAPSULE | Freq: Once | ORAL | Status: AC
  Administered 2014-08-13: 20 mg via ORAL
  Filled 2014-08-13: qty 2

## 2014-08-13 MED ORDER — ALBUTEROL SULFATE HFA 108 (90 BASE) MCG/ACT IN AERS: 4.0000 | INHALATION_SPRAY | Freq: Four times a day (QID) | RESPIRATORY_TRACT | Status: DC | PRN

## 2014-08-13 NOTE — MAU Provider Note (Signed)
History     CSN: [id number]  Arrival date and time: 08/13/14 1854   None     Chief Complaint  Patient presents with  . Emesis During Pregnancy  . Abdominal Pain  . Headache   HPI Abdominal pain. Started two weeks ago and was mild. Now pain is worse. Pain start epigastric and radiates to inguinal area. Crampy pain. Pain is occuring every 5-6 minutes. Patient has been taking pain medication but has not helped since yesterday. Patient also reports chest pressure with shortness of breath that occurred last night. Patient was laying down in bed. Symptoms lasted 15 minutes. No history of heart disease. No family cardiac history. Nothing alleviated the pressure. Changing positions helps sometimes but it comes back. Occurred twice. History of reflux. No current chest pain. Also associated with a frontal throbbing headache. Has had increased coughing, which her husband thinks is a big reason she has her chest pain. No vaginal discharge, bleeding or LOF. Reports she has fetal movement but has not been counting kicks. Reports no constipation.   OB History    Gravida Para Term Preterm AB TAB SAB Ectopic Multiple Living   5 2 1 1 2  2   2       Past Medical History  Diagnosis Date  . Diverticulitis     Past Surgical History  Procedure Laterality Date  . Cholecystectomy    . Cesarean section    . Cesarean section N/A 2006    Family History  Problem Relation Age of Onset  . Hyperlipidemia Mother   . Diabetes Maternal Uncle   . Diabetes Paternal Grandmother     History  Substance Use Topics  . Smoking status: Never Smoker   . Smokeless tobacco: Never Used  . Alcohol Use: No    Allergies: No Known Allergies  Prescriptions prior to admission  Medication Sig Dispense Refill Last Dose  . oxyCODONE-acetaminophen (PERCOCET/ROXICET) 5-325 MG per tablet Take 1-2 tablets by mouth every 6 (six) hours as needed for moderate pain or severe pain. 30 tablet 0 prn  . Prenatal Vit-Fe  Fumarate-FA (MULTIVITAMIN-PRENATAL) 27-0.8 MG TABS tablet Take 1 tablet by mouth daily at 12 noon.   08/12/2014 at Unknown time  . HYDROmorphone (DILAUDID) 2 MG tablet Take 1 tablet (2 mg total) by mouth every 6 (six) hours as needed for severe pain. 15 tablet 0 prn  . NIFEdipine (PROCARDIA) 10 MG capsule Take 1 capsule (10 mg total) by mouth every 8 (eight) hours as needed. (Patient taking differently: Take 10 mg by mouth every 8 (eight) hours as needed (for contractions). ) 6 capsule 0 Past Week at Unknown time    Review of Systems  Constitutional: Positive for fever (subjective).  Respiratory: Positive for cough and shortness of breath.   Cardiovascular: Positive for chest pain.  Gastrointestinal: Positive for abdominal pain. Negative for constipation and blood in stool.  Genitourinary: Negative for dysuria.  Neurological: Positive for headaches.   Physical Exam   Blood pressure 124/67, pulse 103, temperature 98.3 F (36.8 C), temperature source Oral, resp. rate 18, last menstrual period 11/27/2013.  Physical Exam  Vitals reviewed. Constitutional: She is oriented to person, place, and time. She appears well-developed and well-nourished.  Neck: Normal range of motion.  Cardiovascular: Normal rate, regular rhythm and normal heart sounds.   Respiratory: Effort normal. She has wheezes (diffuse). She exhibits tenderness.  GI: Soft. There is tenderness (mild in left lower quadrant).  Musculoskeletal: Normal range of motion. She exhibits edema. She exhibits  no tenderness.  Neurological: She is alert and oriented to person, place, and time.  Skin: Skin is warm and dry.    MAU Course  Procedures  MDM  - Albuterol neb 2.5mg /ml neb treatments x2 - Nifedipine 20mg  x1 - 1L NS bolus x1 - Tylenol 1000mg  x1  EKG: NSR  FHT: 145, moderate variability, +accels, no decels. Category 1 Contractions: infrequent  Assessment and Plan   Lorraine [name] is a 28 y.o. [id number] at [redacted]w[redacted]d with  contractions and bronchospasm  #Bronchospasm: no history of asthma. Wheezing improved with albuterol treatments. Most likely contributing to cough. - albuterol MDI 4puffs q6hrs PRN for wheezing  #Atypical chest pain: reproducible chest pain. Most likely chostochondritis related to increase in coughing. EKG NSR. Atypical presentation overall and do not suspect ACS related event - Recommend continue Tylenol prn - Suspect treating bronchospasm will treat cough and will help prevent worsening chest wall pain  #Contractions: infrequent. Cervix closed. Has already received betamethasone injection x2. - labor precautions discussed with patient   Cordelia Poche 08/13/2014, 9:05 PM   OB fellow attestation:  I have seen and examined this patient; I agree with above documentation in the resident's note.   Lorraine [name] is a 28 y.o. [id number] reporting abdominal pain, headache, shortness of breath +FM, denies LOF, VB, vaginal discharge.  PE: BP 120/68 mmHg  Pulse 99  Temp(Src) 98.4 F (36.9 C) (Oral)  Resp 18  SpO2 97%  LMP 11/27/2013 Gen: calm comfortable, NAD Resp: normal effort, no distress, +wheezing Abd: gravid  ROS, labs, PMH reviewed NST reactive  Patient is 28 y.o. [id number] [redacted]w[redacted]d with hx of multiple admissions for abdominal pain reporting headache, abdominal pain, shortness of breath likely secondary to asthma flare, preterm contraction patterns  Plan:  - fetal kick counts reinforced, preterm labor precautions - initially suspected irregular contraction pattern, started peripheral IV and give 1L bolus - given procardia 20mg  x 1, contractions resolved - tylenol 1,000mg  for headache - albuterol neb x 2 for asthma flare => wheezing improved, if persist would consider prednisone 40mg  x 1 week, however as symptoms improved greatly with 2 nebs steroids deferred - abdominal pain likely 2/2 contractions, improved with IV fluids  Lorraine Haverstock [name], MD 8:36 AM

## 2014-08-13 NOTE — MAU Note (Signed)
Urine in lab 

## 2014-08-13 NOTE — Discharge Instructions (Signed)
Broncoespasmo (Bronchospasm) El broncoespasmo se produce cuando los conductos que transportan el aire desde y [employer] pulmones (vas respiratorias) sufren un espasmo o se Investment banker, corporate. Durante un broncoespasmo es Engineer, maintenance. Esto se debe a que las vas respiratorias se Investment banker, corporate. El broncoespasmo puede ser desencadenado por: 1. Alergias. Puede ser a animales, polen, alimentos o moho. 2. Infeccin. Esta es una causa frecuente de broncoespasmo. 3. Actividad fsica. 4. Agentes irritantes. Por ejemplo, polucin, humo de cigarrillos, olores fuertes, aerosoles y vapores de [place]. 5. Los cambios climticos. 6. Estrs. 7. Estar emocionado. CUIDADOS EN EL HOGAR   Cuente siempre con un plan para pedir ayuda. Sepa cundo debe llamar al mdico y a los servicios de emergencia de su localidad (911 en [place] Sepa dnde puede acceder a un servicio de emergencias.  Solo tome los medicamentos que le haya indicado su mdico.  Si le indicaron el uso de un inhalador o nebulizador, consulte a su mdico para que le explique cmo usarlo correctamente. Siempre use un espaciador con Forensic psychologist, si le proporcionaron uno  Mantenga la calma durante el ataque. Trate de relajarse y respire ms lentamente.  Controle el ambiente de su casa:  Cambie el filtro de la calefaccin y el aire acondicionado al menos una vez al mes.  Limite el uso de hogares o estufas a lea.  No fume. No permita que fumen en su casa.  Evite la exposicin a perfumes y fragancias.  Elimine las plagas (como cucarachas y ratones) y sus excrementos.  Elimine las plantas si observa moho en ellas.  Mantenga su casa limpia y [place].  Reemplace las alfombras por pisos de [place], baldosas o vinilo. Las alfombras pueden retener la caspa de los animales y [place].  Use almohadas, mantas y cubre colchones antialrgicos.  [place] sbanas y las mantas todas las semanas con agua caliente. Squelas en [place].  Use mantas  de polister o algodn.  Lvese las manos con frecuencia. SOLICITE AYUDA SI:  Tiene dolores musculares.  Siente dolor en el pecho.  El catarro espeso que elimina (esputo) cambia de un color claro o blanco a un color amarillo, verde, gris o sanguinolento.  El catarro espeso que elimina se hace ms espeso.  Tiene algn problema que pueda relacionarse con los medicamentos que est tomando como:  Una erupcin cutnea.  Picazn.  Hinchazn.  Problemas para respirar. SOLICITE AYUDA DE INMEDIATO SI:  No puede respirar normalmente.  No puede dejar de toser.  El tratamiento no lo ayuda a Fish farm manager.  Siente un dolor muy intenso en el pecho. ASEGRESE DE QUE:   Comprende estas instrucciones.  Controlar su afeccin.  Recibir ayuda de inmediato si no mejora o si empeora. Document Released: 10/06/2010 Document Revised: 09/08/2013 [facility] Patient Information 2015 [place]. This information is not intended to replace advice given to you by your health care provider. Make sure you discuss any questions you have with your health care provider.   Informacin sobre Nurse, learning disability  (Preterm Labor Information) El parto prematuro comienza antes de la semana 9 de [place]. La duracin de un embarazo normal es de 39 a 41 semanas.  CAUSAS  Generalmente no hay una causa que pueda identificarse del motivo por el que una mujer comienza un trabajo de parto prematuro. Sin embargo, una de las causas conocidas ms frecuentes son las infecciones. Las infecciones del tero, el cuello, la vagina, el lquido amnitico, la vejiga, los riones y Nurse, children's de los pulmones (neumona) pueden hacer que  el trabajo de parto se inicie. Otras causas que pueden sospecharse son:  8. Infecciones urogenitales, como infecciones por hongos y vaginosis bacteriana.  9. Anormalidades uterinas (forma del tero, sptum uterino, fibromas, hemorragias en la placenta).  10. Un cuello que ha sido operado (puede  ser que no permanezca cerrado).  11. Malformaciones del feto.  12. Gestaciones mltiples (mellizos, trillizos y ms).  Jette previa de parto prematuro.   Tener ruptura prematura de las membranas (RPM).   La placenta cubre la abertura del cuello (placenta previa).   La placenta se separa del tero (abrupcin placentaria).   El cuello es demasiado dbil para contener al beb en el tero (cuello incompetente).   Hay mucho lquido en el saco amnitico (polihidramnios).   Consumo de drogas o hbito de fumar durante Water quality scientist.   No aumentar de peso lo suficiente durante el [employer].   Mujeres menores de 18 aos o mayores de 35 aos.   Nivel socioeconmico bajo.   Pertenecer a Advertising copywriter. SNTOMAS  Los signos y sntomas del trabajo de parto prematuro son:   Wellsite geologist similares a los Agricultural engineer, dolor abdominal o dolor de espalda.  Contracciones uterinas regulares, tan frecuentes como seis por hora, sin importar su intensidad (pueden ser suaves o dolorosas).  Contracciones que comienzan en la parte superior del tero y se expanden hacia abajo, a la zona inferior del abdomen y la espalda.   Sensacin de aumento de presin en la pelvis.   Aparece una secrecin acuosa o sanguinolenta por la vagina.  TRATAMIENTO  Segn el tiempo del embarazo y otras [place], el mdico puede indicar reposo en cama. Si es necesario, le indicarn medicamentos para Scientist, water quality las contracciones y para Neurosurgeon los pulmones del feto. Si el trabajo de parto se inicia antes de las 34 semanas de [place], se recomienda la hospitalizacin. El tratamiento depende de las condiciones en que se encuentren usted y el feto.  QU DEBE HACER SI PIENSA QUE EST EN TRABAJO DE PARTO PREMATURO?  Comunquese con su mdico inmediatamente. Debe concurrir al hospital para ser controlada inmediatamente.  [place] EN FUTUROS EMBARAZOS?  Usted debe:   Si fuma, abandonar el hbito.  Mantener un peso saludable y evitar sustancias qumicas y drogas innecesarias.  Controlar todo tipo de infeccin.  Informe a su mdico si tiene una historia conocida de trabajo de parto prematuro. Document Released: 12/11/2007 Document Revised: 05/06/2013 [facility] Patient Information 2015 [place]. This information is not intended to replace advice given to you by your health care provider. Make sure you discuss any questions you have with your health care provider. Cmo usar Educational psychologist (How to Use an Inhaler) Es muy importante que sepa usar su [place]. Una buena tcnica garantizar que el medicamento llegue a los pulmones.  CMO USAR UN INHALADOR: 65. Retire la tapa del inhalador. 15. Si esta es la primera vez que [place] el [place], debe prepararlo. Sacuda el inhalador durante 5segundos. Libere cuatro descargas en el aire, lejos del rostro. Si tiene preguntas, pdale ayuda al mdico. 16. Sacuda el inhalador durante 5segundos. 12. Gire el inhalador de modo que la botella quede por encima de la boquilla. [place] ndice por encima de la botella. El pulgar debe sujetar la parte inferior del inhalador. [place]. 20. Sostenga el inhalador lejos de la boca (un ancho de 2 dedos) o coloque los labios  alrededor de la boquilla. Pregntele al mdico de qu manera debe usar Forensic psychologist. 21. Exhale la mayor cantidad de aire que pueda. 55. Inhale y presione la botella hacia abajo 1vez para Product/process development scientist. Sentir cmo el medicamento ingresa a la boca y Patent examiner. 23. Siga inspirando profundamente, muy despacio. Trate de llenar los pulmones. 24. Despus de que haya inspirado profundamente, contenga la respiracin durante 10segundos. Esto ayudar a que el medicamento se asiente en los pulmones. Si no puede contener la respiracin durante 10segundos, contngala  cuanto ms pueda antes de exhalar. 25. Exhale lentamente a travs de los labios fruncidos. Ponga los labios como cuando silba. 26. Si el mdico le ha indicado que debe aspirar ms de 1 descarga, espere de 15 a 30segundos como mnimo entre Counsellor. Esto ayudar a que [employer] del [place]. No use el inhalador ms veces de las que el [employer]. 27. Vuelva a colocar la tapa en el inhalador. [place] instrucciones que vienen en la caja para Air cabin crew. Si [place] ms de Educational psychologist, pregntele al mdico qu inhaladores debe usar y en qu orden. Pdale al mdico que lo ayude a determinar cundo Health and safety inspector a Biomedical engineer.  Si [place] un inhalador con corticoides, enjuguese siempre la boca con agua despus de la ltima descarga, hgase grgaras y escupa el agua. No trague el agua. SOLICITE AYUDA SI:  El medicamento del Tax inspector solo lo ayuda parcialmente a Scientist, water quality las sibilancias y las dificultades para Ambulance person.  Tiene dificultad para Water quality scientist.  Experimenta un leve aumento de la expectoracin espesa (flema). SOLICITE AYUDA DE INMEDIATO SI:  El medicamento del inhalador no ayuda a Scientist, water quality las sibilancias o la dificultad para respirar, o bien, si siente opresin en el pecho.  Siente mareos, dolor de cabeza o una frecuencia cardaca acelerada.  Siente escalofros, fiebre o sudores nocturnos.  Experimenta un aumento considerable de la expectoracin espesa o si observa sangre en dicha expectoracin espesa. ASEGRESE DE QUE:   Comprende estas instrucciones.  Controlar su afeccin.  Recibir ayuda de inmediato si no mejora o si empeora. Document Released: 10/06/2010 Document Revised: 06/24/2013 [facility] Patient Information 2015 [place]. This information is not intended to replace advice given to you by your health care provider. Make sure you discuss any questions you have with your health  care provider. How to Use an Inhaler Using your inhaler correctly is very important. Good technique will make sure that the medicine reaches your lungs.  HOW TO USE AN INHALER: 29. Take the cap off the inhaler. 30. If this is the first time using your inhaler, you need to prime it. Shake the inhaler for 5 seconds. Release four puffs into the air, away from your face. Ask your doctor for help if you have questions. 31. Shake the inhaler for 5 seconds. 32. Turn the inhaler so the bottle is above the mouthpiece. 33. Put your pointer finger on top of the bottle. Your thumb holds the bottom of the inhaler. 34. Open your mouth. 35. Either hold the inhaler away from your mouth (the width of 2 fingers) or place your lips tightly around the mouthpiece. Ask your doctor which way to use your inhaler. 36. Breathe out as much air as possible. 37. Breathe in and push down on the bottle 1 time to release the medicine. You will feel the medicine go in your mouth and throat. 38. Continue to take a deep  breath in very slowly. Try to fill your lungs. 39. After you have breathed in completely, hold your breath for 10 seconds. This will help the medicine to settle in your lungs. If you cannot hold your breath for 10 seconds, hold it for as long as you can before you breathe out. 40. Breathe out slowly, through pursed lips. Whistling is an example of pursed lips. 41. If your doctor has told you to take more than 1 puff, wait at least 15-30 seconds between puffs. This will help you get the best results from your medicine. Do not use the inhaler more than your doctor tells you to. 42. Put the cap back on the inhaler. 43. Follow the directions from your doctor or from the inhaler package about cleaning the inhaler. If you use more than one inhaler, ask your doctor which inhalers to use and what order to use them in. Ask your doctor to help you figure out when you will need to refill your inhaler.  If you use a steroid  inhaler, always rinse your mouth with water after your last puff, gargle and spit out the water. Do not swallow the water. GET HELP IF:  The inhaler medicine only partially helps to stop wheezing or shortness of breath.  You are having trouble using your inhaler.  You have some increase in thick spit (phlegm). GET HELP RIGHT AWAY IF:  The inhaler medicine does not help your wheezing or shortness of breath or you have tightness in your chest.  You have dizziness, headaches, or fast heart rate.  You have chills, fever, or night sweats.  You have a large increase of thick spit, or your thick spit is bloody. MAKE SURE YOU:   Understand these instructions.  Will watch your condition.  Will get help right away if you are not doing well or get worse. Document Released: 06/12/2008 Document Revised: 06/24/2013 Document Reviewed: 04/02/2013 [facility] Patient Information 2015 [place], [place]. This information is not intended to replace advice given to you by your health care provider. Make sure you discuss any questions you have with your health care provider. How to Use an Inhaler Using your inhaler correctly is very important. Good technique will make sure that the medicine reaches your lungs.  HOW TO USE AN INHALER: 44. Take the cap off the inhaler. 45. If this is the first time using your inhaler, you need to prime it. Shake the inhaler for 5 seconds. Release four puffs into the air, away from your face. Ask your doctor for help if you have questions. 46. Shake the inhaler for 5 seconds. 47. Turn the inhaler so the bottle is above the mouthpiece. 48. Put your pointer finger on top of the bottle. Your thumb holds the bottom of the inhaler. 49. Open your mouth. 50. Either hold the inhaler away from your mouth (the width of 2 fingers) or place your lips tightly around the mouthpiece. Ask your doctor which way to use your inhaler. 51. Breathe out as much air as possible. 52. Breathe in and  push down on the bottle 1 time to release the medicine. You will feel the medicine go in your mouth and throat. 53. Continue to take a deep breath in very slowly. Try to fill your lungs. 54. After you have breathed in completely, hold your breath for 10 seconds. This will help the medicine to settle in your lungs. If you cannot hold your breath for 10 seconds, hold it for as long as you can  before you breathe out. 55. Breathe out slowly, through pursed lips. Whistling is an example of pursed lips. 56. If your doctor has told you to take more than 1 puff, wait at least 15-30 seconds between puffs. This will help you get the best results from your medicine. Do not use the inhaler more than your doctor tells you to. 57. Put the cap back on the inhaler. 58. Follow the directions from your doctor or from the inhaler package about cleaning the inhaler. If you use more than one inhaler, ask your doctor which inhalers to use and what order to use them in. Ask your doctor to help you figure out when you will need to refill your inhaler.  If you use a steroid inhaler, always rinse your mouth with water after your last puff, gargle and spit out the water. Do not swallow the water. GET HELP IF:  The inhaler medicine only partially helps to stop wheezing or shortness of breath.  You are having trouble using your inhaler.  You have some increase in thick spit (phlegm). GET HELP RIGHT AWAY IF:  The inhaler medicine does not help your wheezing or shortness of breath or you have tightness in your chest.  You have dizziness, headaches, or fast heart rate.  You have chills, fever, or night sweats.  You have a large increase of thick spit, or your thick spit is bloody. MAKE SURE YOU:   Understand these instructions.  Will watch your condition.  Will get help right away if you are not doing well or get worse. Document Released: 06/12/2008 Document Revised: 06/24/2013 Document Reviewed:  04/02/2013 [facility] Patient Information 2015 [place], [place]. This information is not intended to replace advice given to you by your health care provider. Make sure you discuss any questions you have with your health care provider.

## 2014-08-13 NOTE — MAU Note (Addendum)
Was having abdominal pain a week ago and received pain med from here.  Having abd pain yesterday and took pain med but didn't help.  Vomited 3 times since yesterday.  Nausated now.  Baby not moving as often.  No bleeding.  Yesterday had some leaking clear but may be urine.  No diarrhea.  No fever.  Has received Betamethasone 2 injections, 2 weeks ago.  Also reports when lying down has a pressure feeling in her chest, nothing she does relieves it, it just lasts for 15 min and then goes away.

## 2014-08-14 DIAGNOSIS — Z3A33 33 weeks gestation of pregnancy: Secondary | ICD-10-CM

## 2014-08-14 DIAGNOSIS — R0789 Other chest pain: Secondary | ICD-10-CM

## 2014-08-14 DIAGNOSIS — J9801 Acute bronchospasm: Secondary | ICD-10-CM

## 2014-08-14 DIAGNOSIS — O4703 False labor before 37 completed weeks of gestation, third trimester: Secondary | ICD-10-CM

## 2014-08-16 ENCOUNTER — Telehealth: Payer: Self-pay | Admitting: *Deleted

## 2014-08-16 ENCOUNTER — Encounter (HOSPITAL_COMMUNITY): Payer: Self-pay | Admitting: *Deleted

## 2014-08-16 ENCOUNTER — Inpatient Hospital Stay (HOSPITAL_COMMUNITY)
Admission: AD | Admit: 2014-08-16 | Discharge: 2014-08-16 | Disposition: A | Discharge status: Home / Self Care | Payer: Medicaid Other | Source: Ambulatory Visit | Attending: Obstetrics & Gynecology | Admitting: Obstetrics & Gynecology

## 2014-08-16 ENCOUNTER — Ambulatory Visit (INDEPENDENT_AMBULATORY_CARE_PROVIDER_SITE_OTHER): Payer: Medicaid Other | Admitting: Family Medicine

## 2014-08-16 VITALS — BP 125/81 | HR 91 | Temp 97.9°F | Wt 199.6 lb

## 2014-08-16 DIAGNOSIS — K602 Anal fissure, unspecified: Secondary | ICD-10-CM | POA: Insufficient documentation

## 2014-08-16 DIAGNOSIS — Z3A32 32 weeks gestation of pregnancy: Secondary | ICD-10-CM

## 2014-08-16 DIAGNOSIS — O34219 Maternal care for unspecified type scar from previous cesarean delivery: Secondary | ICD-10-CM

## 2014-08-16 DIAGNOSIS — K5909 Other constipation: Secondary | ICD-10-CM

## 2014-08-16 DIAGNOSIS — K5900 Constipation, unspecified: Secondary | ICD-10-CM | POA: Insufficient documentation

## 2014-08-16 DIAGNOSIS — Z3483 Encounter for supervision of other normal pregnancy, third trimester: Secondary | ICD-10-CM

## 2014-08-16 DIAGNOSIS — R1032 Left lower quadrant pain: Secondary | ICD-10-CM | POA: Insufficient documentation

## 2014-08-16 DIAGNOSIS — O9989 Other specified diseases and conditions complicating pregnancy, childbirth and the puerperium: Secondary | ICD-10-CM | POA: Insufficient documentation

## 2014-08-16 DIAGNOSIS — O3421 Maternal care for scar from previous cesarean delivery: Secondary | ICD-10-CM

## 2014-08-16 DIAGNOSIS — Z3A34 34 weeks gestation of pregnancy: Secondary | ICD-10-CM | POA: Insufficient documentation

## 2014-08-16 LAB — URINALYSIS, ROUTINE W REFLEX MICROSCOPIC
Bilirubin Urine: NEGATIVE
Glucose, UA: NEGATIVE mg/dL
Hgb urine dipstick: NEGATIVE
Ketones, ur: NEGATIVE mg/dL
LEUKOCYTES UA: NEGATIVE
Nitrite: NEGATIVE
PH: 6.5 (ref 5.0–8.0)
Protein, ur: NEGATIVE mg/dL
SPECIFIC GRAVITY, URINE: 1.02 (ref 1.005–1.030)
Urobilinogen, UA: 0.2 mg/dL (ref 0.0–1.0)

## 2014-08-16 LAB — POCT URINALYSIS DIP (DEVICE)
BILIRUBIN URINE: NEGATIVE
GLUCOSE, UA: NEGATIVE mg/dL
Hgb urine dipstick: NEGATIVE
KETONES UR: NEGATIVE mg/dL
LEUKOCYTES UA: NEGATIVE
NITRITE: NEGATIVE
PH: 6 (ref 5.0–8.0)
Protein, ur: NEGATIVE mg/dL
Specific Gravity, Urine: 1.02 (ref 1.005–1.030)
Urobilinogen, UA: 0.2 mg/dL (ref 0.0–1.0)

## 2014-08-16 NOTE — Progress Notes (Signed)
Lorraine [name] Spanish Interpreter Pt complains of upper abdominal pain that radiates down the left side of the abdomen yesterday, describes as a stabbing pain

## 2014-08-16 NOTE — Progress Notes (Signed)
C/o abdominal pain last bleeding was last week--she has been hospitalized x 2 with bleeding and with abd. Pain.  She reports some associated nausea and epigastric and left sided abdominal pain--unclear etiology--w/u negative to date--advised warning signs to come to hospital. Needs C-section posted with BTL Spanish interpreter: Cristie Hem used

## 2014-08-16 NOTE — Patient Instructions (Addendum)
Tener una circuncisin realizada en el hospital cuesta aproximadamente $ 480. Esto tendr que ser pagado en su totalidad antes de la circuncisin se realiza. Hay lugares para tener la circuncisin realizada en forma ambulatoria, que son ms baratos.  Las circuncisiones Proveedor Telfono Precio -------------------------------------------------- ---------------------------- [facility] [phone] $ 480 por [place] [phone] $ 244 por 4 semanas [facility] [phone] $ 175 por 2 semanas [facility] [phone] $ 250 por [place] [phone] $ 150 por 4 semanas    [place] materna (Breastfeeding) Decidir Economist es una de las mejores elecciones que puede hacer por usted y su beb. El cambio hormonal durante el Media planner produce el desarrollo del tejido mamario y [place] la cantidad y el tamao de los conductos galactforos. Estas hormonas tambin permiten que las protenas, los azcares y las grasas de la sangre produzcan la Northeast Utilities materna en las glndulas productoras de [place]. Las hormonas impiden que la leche materna sea liberada antes del nacimiento del beb, adems de impulsar el flujo de leche luego del nacimiento. Una vez que ha comenzado a Economist, Freight forwarder beb, as Therapist, occupational succin o Social research officer, government, pueden estimular la liberacin de [place] de las glndulas productoras de [place].  LOS BENEFICIOS DE AMAMANTAR Para el beb  La primera leche (calostro) ayuda a Garment/textile technologist funcionamiento del sistema digestivo del beb.  La leche tiene anticuerpos que ayudan a Chemical engineer las infecciones en el beb.  El beb tiene una menor incidencia de asma, alergias y del sndrome de muerte sbita del lactante.  Los nutrientes en la [place] materna son mejores para el beb que la [place] maternizada y estn preparados exclusivamente para cubrir las necesidades del beb.  La leche materna mejora el desarrollo cerebral del beb.  Es menos probable que el beb desarrolle otras enfermedades, como obesidad  infantil, asma o diabetes mellitus de tipo 2. Para usted   La lactancia materna favorece el desarrollo de un vnculo muy especial entre la madre y el beb.  Es conveniente. La leche materna siempre est disponible a la Tree surgeon y es [place].  La lactancia materna ayuda a quemar caloras y a perder el peso ganado durante el [place].  Favorece la contraccin del tero al tamao que tena antes del embarazo de manera ms rpida y disminuye el sangrado (loquios) despus del parto.  La lactancia materna contribuye a reducir Catering manager de desarrollar diabetes mellitus de tipo 2, osteoporosis o cncer de mama o de ovario en el futuro. SIGNOS DE QUE EL BEB EST HAMBRIENTO Primeros signos de hambre  Aumenta su estado de [place].  Se estira.  Mueve la cabeza de un lado a otro.  Mueve la cabeza y abre la boca cuando se le toca la mejilla o la comisura de la boca (reflejo de bsqueda).  [place] vocalizaciones, tales como sonidos de succin, se relame los labios, emite arrullos, suspiros, o chirridos.  Mueve la [employer] boca.  Se chupa con ganas los dedos o las manos. Signos tardos de [employer].  Llora de manera intermitente. Signos de [employer]oles[employer] signos de hambre extrema requerirn que lo calme y lo consuele antes de que el beb pueda alimentarse adecuadamente. No espere a que se manifiesten los siguientes signos de hambre extrema para comenzar a Economist:   Air cabin crew.  Llanto intenso y fuerte.   Gritos. INFORMACIN BSICA SOBRE LA LACTANCIA MATERNA Iniciacin de la lactancia materna  Encuentre un lugar cmodo para sentarse o acostarse, con un  buen respaldo para el cuello y la espalda.  Coloque una almohada o una manta enrollada debajo del beb para acomodarlo a la altura de la mama (si est sentada). Las almohadas para Economist se han diseado especialmente a fin de servir de apoyo para los brazos y el beb Boeing.  Asegrese de que el abdomen del beb est frente al suyo.  Masajee suavemente la mama. Con las yemas de los dedos, masajee la pared del pecho hacia el pezn en un movimiento circular. Esto estimula el flujo de [place]. Es posible que Oceanographer este movimiento mientras amamanta si la leche fluye lentamente.  Sostenga la mama con el pulgar por arriba del pezn y los otros 4 dedos por debajo de la mama. Asegrese de que los dedos se encuentren lejos del pezn y de la boca del beb.  Empuje suavemente los labios del beb con el pezn o con el dedo.  Cuando la boca del beb se abra lo suficiente, acrquelo rpidamente a la mama e introduzca todo el pezn y la zona oscura que lo rodea (areola), tanto como sea posible, dentro de la boca del beb.  Debe haber ms areola visible por arriba del labio superior del beb que por debajo del labio inferior.  La lengua del beb debe estar entre la enca inferior y la [place].  Asegrese de que la boca del beb est en la posicin correcta alrededor del pezn (prendida). Los labios del beb deben crear un sello sobre la mama y estar doblados hacia afuera (invertidos).  Es comn que el beb succione durante 2 a 3 minutos para que comience el flujo de [place]. Cmo debe prenderse Es muy importante que le ensee al beb cmo prenderse adecuadamente a la mama. Si el beb no se prende adecuadamente, puede causarle dolor en el pezn y reducir la produccin de [place], y hacer que el beb tenga un escaso aumento de [place]. Adems, si el beb no se prende adecuadamente al pezn, puede tragar aire durante la alimentacin. Esto puede causarle molestias al beb. Hacer eructar al beb al Eliezer Lofts de mama puede ayudarlo a liberar el aire. Sin embargo, ensearle al beb cmo prenderse a la mama adecuadamente es la mejor manera de evitar que se sienta molesto por tragar Administrator, sports se alimenta. Signos de que el beb se ha prendido adecuadamente al pezn:    Hall Busing o succiona de modo silencioso, sin causarle dolor.  Se escucha que traga cada 3 o 4 succiones.   Hay movimientos musculares por arriba y por delante de sus odos al Mining engineer. Signos de que el beb no se ha prendido Product manager al pezn:   Hace ruidos de succin o de chasquido mientras se alimenta.  Siente dolor en el pezn. Si cree que el beb no se prendi correctamente, deslice el dedo en la comisura de la boca y [employer] Technology las encas del beb para interrumpir la succin. Intente comenzar a amamantar nuevamente. Signos de Economist Signos del beb:   Disminuye gradualmente el nmero de succiones o cesa la succin por completo.  Se duerme.  Relaja el cuerpo.  Retiene una pequea cantidad de [employer] boca.  Se desprende solo del pecho. Signos que presenta usted:  Las mamas han aumentado la firmeza, el peso y el tamao 1 a 3 horas despus de Economist.  Estn ms blandas inmediatamente despus de amamantar.  Un aumento del volumen de [place], y tambin un cambio en su consistencia y color se  producen hacia el [place] de Therapist, nutritional.  Los pezones no duelen, ni estn agrietados ni sangran. Signos de que su beb recibe la cantidad de leche suficiente  Moja al menos 3 paales en 24 horas. La orina debe ser clara y de color amarillo plido a los [place].  Defeca al menos 3 veces en 24 horas a los 5 das de vida. La materia fecal debe ser blanda y [place].  Defeca al menos 3 veces en 24 horas a los 7 das de vida. La materia fecal debe ser grumosa y [place].  No registra una prdida de peso mayor del 10% del peso al nacer durante los primeros [place].  Aumenta de peso un promedio de 4 a 7onzas (113 a 198g) por semana despus de los [place].  Aumenta de [place], [place], de [place] uniforme a Proofreader de los 5 das de vida, sin Museum/gallery curator prdida de peso despus de las 2semanas de vida. Despus de  alimentarse, es posible que el beb regurgite una pequea cantidad. Esto es frecuente. FRECUENCIA Y DURACIN DE LA LACTANCIA MATERNA El amamantamiento frecuente la ayudar a producir ms [place] y a Warden/ranger de Social research officer, government en los pezones e hinchazn en las [place]. Alimente al beb cuando muestre signos de hambre o si siente la necesidad de reducir la congestin de las [place]. Esto se denomina "lactancia a demanda". Evite el uso del chupete mientras trabaja para establecer la lactancia (las primeras 4 a 6 semanas despus del nacimiento del beb). Despus de este perodo, podr ofrecerle un chupete. Las investigaciones demostraron que el uso del chupete durante el primer ao de vida del beb disminuye el riesgo de desarrollar el sndrome de muerte sbita del lactante (SMSL). Permita que el nio se alimente en cada mama todo lo que desee. Contine amamantando al beb hasta que haya terminado de alimentarse. Cuando el beb se desprende o se queda dormido mientras se est alimentando de la primera mama, ofrzcale la segunda. Debido a que, con frecuencia, los recin [place] [employer] las primeras semanas de vida, es posible que deba despertar al beb para alimentarlo. Los horarios de Writer de un beb a otro. Sin embargo, las siguientes reglas pueden servir como gua para ayudarla a Engineer, materials que el beb se alimenta adecuadamente:  Se puede amamantar a los recin nacidos (bebs de 4 semanas o menos de vida) cada 1 a 3 horas.  No deben transcurrir ms de 3 horas durante el da o 5 horas durante la noche sin que se amamante a los recin nacidos.  Debe amamantar al beb 8 veces como mnimo en un perodo de 24 horas, hasta que comience a introducir slidos en su dieta, a los 6 meses de vida aproximadamente. [place] extraccin y Recruitment consultant de la leche materna le permiten asegurarse de que el beb se alimente exclusivamente de [place], aun en momentos en  los que no puede amamantar. Esto tiene especial importancia si debe regresar al [place] en el perodo en que an est amamantando o si no puede estar presente en los momentos en que el beb debe alimentarse. Su asesor en lactancia puede orientarla sobre cunto tiempo es [place].  El sacaleche es un aparato que le permite extraer leche de la mama a un recipiente estril. Luego, la leche materna extrada puede almacenarse en un refrigerador o Pension scheme manager. Algunos sacaleches son Theodore Demark, James Ivanoff otros son elctricos. Consulte a su asesor en  lactancia qu tipo ser ms conveniente para usted. Los sacaleches se pueden comprar; sin embargo, algunos hospitales y grupos de apoyo a la lactancia materna alquilan Production assistant, radio. Un asesor en lactancia puede ensearle cmo extraer [employer], en caso de que prefiera no usar un sacaleche.  CMO CUIDAR LAS MAMAS DURANTE LA LACTANCIA MATERNA Los pezones se secan, agrietan y duelen durante la Therapist, nutritional. Las siguientes recomendaciones pueden ayudarla a Theatre manager las [employer] y sanas:  Art therapist usar jabn en los pezones.  Use un sostn de soporte. Aunque no son esenciales, las camisetas sin mangas o los sostenes especiales para Economist estn diseados para acceder fcilmente a las mamas, para Economist sin tener que quitarse todo el sostn o la camiseta. Evite usar sostenes con aro o sostenes muy ajustados.  Seque al aire sus pezones durante 3 a 71minutos despus de amamantar al beb.  Utilice solo apsitos de Chiropodist sostn para Tax adviser las prdidas de [place]. La prdida de un poco de [employer]ning tomas es normal.  Utilice lanolina sobre los pezones luego de Economist. La lanolina ayuda a mantener la humedad normal de la piel. Si [place] lanolina pura, no tiene que lavarse los pezones antes de volver a Research scientist (life sciences) al beb. La lanolina pura no es txica para el beb. Adems, puede extraer  Cisco algunas gotas de [place] materna y Community education officer suavemente esa [employer], para que la [place] se seque al aire. Durante las primeras semanas despus de dar a luz, algunas mujeres pueden experimentar hinchazn en las mamas (congestin [place]). La congestin puede hacer que sienta las mamas pesadas, calientes y sensibles al tacto. El pico de la congestin ocurre dentro de los 3 a 5 das despus del [place]. Las siguientes recomendaciones pueden ayudarla a Public house manager la congestin:  Vace por completo las mamas al [place]. Puede aplicar calor hmedo en las mamas (en la ducha o con toallas hmedas para manos) antes de Economist o extraer Northeast Utilities. Esto aumenta la circulacin y [place] a que la [place]. Si el beb no vaca por completo las mamas cuando lo amamanta, extraiga la [place] restante despus de que haya finalizado.  Use un sostn ajustado (para amamantar o comn) o una camiseta sin mangas durante 1 o 2 das para indicar al cuerpo que disminuya ligeramente la produccin de [place].  Aplique compresas de hielo [employer], a menos que le resulte demasiado incmodo.  Asegrese de que el beb est prendido y se encuentre en la posicin correcta mientras lo alimenta. Si la congestin persiste luego de 48 horas o despus de seguir estas recomendaciones, comunquese con su mdico o un Lobbyist. RECOMENDACIONES GENERALES PARA EL CUIDADO DE LA SALUD DURANTE LA LACTANCIA MATERNA  Consuma alimentos saludables. [place] comidas y colaciones, y coma 3 de cada una por da. Dado que lo que come Solectron Corporation, es posible que algunas comidas hagan que su beb se vuelva ms irritable de lo habitual. Evite comer este tipo de alimentos si percibe que afectan de manera negativa al beb.  Beba leche, jugos de fruta y agua para Engineer, water su sed (aproximadamente [place]).  Descanse con frecuencia, reljese y tome sus vitaminas prenatales para evitar la  fatiga, el estrs y la anemia.  Contine con los autocontroles de la mama.  Evite masticar y fumar tabaco.  Evite el consumo de alcohol y drogas. Algunos medicamentos, que pueden ser perjudiciales para el beb, pueden pasar a Lawerance Cruel  de la [employer]. Es importante que consulte a su mdico antes de Medical sales representative, incluidos todos los medicamentos recetados y de [place], as como los suplementos vitamnicos y herbales. Puede quedar embarazada durante la lactancia. Si desea controlar la natalidad, consulte a su mdico cules son las opciones ms seguras para el beb. SOLICITE ATENCIN MDICA SI:   Usted siente que quiere dejar de Economist o se siente frustrada con la lactancia.  Siente dolor en las mamas o en los pezones.  Sus pezones estn agrietados o Control and instrumentation engineer.  Sus pechos estn irritados, sensibles o calientes.  Tiene un rea hinchada en cualquiera de las mamas.  Siente escalofros o fiebre.  Tiene nuseas o vmitos.  Presenta una secrecin de otro lquido distinto de la leche materna de los pezones.  Sus mamas no se llenan antes de Economist al beb para el quinto da despus del [place].  Se siente triste y deprimida.  El beb est demasiado somnoliento como para comer bien.  El beb tiene problemas para dormir.  Moja menos de 3 paales en 24 horas.  Defeca menos de 3 veces en 24 horas.  La piel del beb o la parte blanca de los ojos se vuelven amarillentas.  El beb no ha aumentado de [place] a los [place]. SOLICITE ATENCIN MDICA DE INMEDIATO SI:   El beb est muy cansado Engineer, manufacturing) y no se quiere despertar para comer.  Le sube la fiebre sin causa. Document Released: 09/03/2005 Document Revised: 09/08/2013 [facility] Patient Information 2015 [place]. This information is not intended to replace advice given to you by your health care provider. Make sure you discuss any questions you have with your health care provider.  Informacin  sobre Nurse, learning disability  (Preterm Labor Information) El parto prematuro comienza antes de la semana 48 de [place]. La duracin de un embarazo normal es de 39 a 41 semanas.  CAUSAS  Generalmente no hay una causa que pueda identificarse del motivo por el que una mujer comienza un trabajo de parto prematuro. Sin embargo, una de las causas conocidas ms frecuentes son las infecciones. Las infecciones del tero, el cuello, la vagina, el lquido [place], la vejiga, los riones y Nurse, children's de los pulmones (neumona) pueden hacer que el trabajo de parto se inicie. Otras causas que pueden sospecharse son:   Infecciones urogenitales, como infecciones por hongos y vaginosis bacteriana.   Anormalidades uterinas (forma del tero, sptum uterino, fibromas, hemorragias en la placenta).   Un cuello que ha sido operado (puede ser que no permanezca cerrado).   Malformaciones del feto.   Gestaciones mltiples (mellizos, trillizos y ms).   [place] previa de parto prematuro.   Tener ruptura prematura de las membranas (RPM).   La placenta cubre la abertura del cuello (placenta previa).   La placenta se separa del tero (abrupcin placentaria).   El cuello es demasiado dbil para contener al beb en el tero (cuello incompetente).   Hay mucho lquido en el saco amnitico (polihidramnios).   Consumo de drogas o hbito de fumar durante Water quality scientist.   No aumentar de peso lo suficiente durante el So[employer]ron Corporation.   Mujeres menores de 18 aos o mayores de 35 aos.   Nivel socioeconmico bajo.   Pertenecer a Advertising copywriter. SNTOMAS  Los signos y sntomas del trabajo de parto prematuro son:   Wellsite geologist similares a los Agricultural engineer, dolor abdominal o dolor de espalda.  Contracciones uterinas regulares,  tan frecuentes como seis por hora, sin importar su intensidad (pueden ser suaves o dolorosas).  Contracciones que comienzan en la parte  superior del tero y se expanden hacia abajo, a la zona inferior del abdomen y la espalda.   Sensacin de aumento de presin en la pelvis.   Aparece una secrecin acuosa o sanguinolenta por la vagina.  TRATAMIENTO  Segn el tiempo del embarazo y otras [place], el mdico puede indicar reposo en cama. Si es necesario, le indicarn medicamentos para Scientist, water quality las contracciones y para Neurosurgeon los pulmones del feto. Si el trabajo de parto se inicia antes de las 34 semanas de [place], se recomienda la hospitalizacin. El tratamiento depende de las condiciones en que se encuentren usted y el feto.  QU DEBE HACER SI PIENSA QUE EST EN TRABAJO DE PARTO PREMATURO?  Comunquese con su mdico inmediatamente. Debe concurrir al hospital para ser controlada inmediatamente.  [place] EN FUTUROS EMBARAZOS?  Usted debe:   Si fuma, abandonar el hbito.  Mantener un peso saludable y evitar sustancias qumicas y drogas innecesarias.  Controlar todo tipo de infeccin.  Informe a su mdico si tiene una historia conocida de trabajo de parto prematuro. Document Released: 12/11/2007 Document Revised: 05/06/2013 [facility] Patient Information 2015 [place]. This information is not intended to replace advice given to you by your health care provider. Make sure you discuss any questions you have with your health care provider.

## 2014-08-16 NOTE — MAU Note (Signed)
Patient states she has been having upper mid to left abdominal pain for about 1 1/2 weeks with bowel movements. Has been constipated and has a little bleeding with BM;'s. Taking Miralax but is not helping. Nausea and vomiting today when trying to eat. Reports good fetal movement.

## 2014-08-16 NOTE — Telephone Encounter (Signed)
Patient called and complained of having constipation with some blood from her rectum when she wipes. States that she also has been treated for diverticulitus in the past. I advised patient to try using miralax to help with her constipation and if she didn't get relief to call us back. If she has symptoms of diverticulitus also advised her to call back or go to MAU after hours. Patient agreeable to this.

## 2014-08-16 NOTE — Discharge Instructions (Signed)
Estreñimiento °(Constipation) °Se llama constipación cuando: °· Elimina heces (defeca) menos de 3 veces por semana. °· Tiene dificultad para defecar. °· Las heces son secas y duras o son más grandes que lo normal. °CUIDADOS EN EL HOGAR  °· Consuma alimentos con alto contenido de fibra. Por ejemplo, frutas, vegetales, porotos y cereales integrales, como el arroz integral. °· Evite los alimentos ricos en grasas y azúcar. Estos incluyen patatas fritas, hamburguesas, galletas, dulces y refrescos. °· Si no consume suficientes alimentos ricos en fibras, tome productos que tengan agregado de fibra (suplementos). °· Beba suficiente líquido para mantener el pis (orina) claro o de color amarillo pálido. °· Haga ejercicio en forma regular, o como lo indique su médico. °· Vaya al baño cuando sienta la necesidad de defecar. No se aguante las ganas. °· Solo tome los medicamentos que le haya indicado su médico. No tome medicamentos que le ayuden a defecar (laxantes) sin antes consultarlo con su médico. °SOLICITE AYUDA DE INMEDIATO SI:  °· Observa sangre brillante en las heces (materia fecal). °· El estreñimiento dura más de 4 días o empeora. °· Tiene dolor en el vientre (abdominal) o el trasero (recto). °· Las heces son delgadas (como un lápiz). °· Pierde peso de manera inexplicable. °ASEGÚRESE DE QUE:  °· Comprende estas instrucciones. °· Controlará su afección. °· Recibirá ayuda de inmediato si no mejora o si empeora. °Document Released: 10/06/2010 Document Revised: 09/08/2013 °ExitCare® Patient Information ©2015 Exit[employer], [employer]. This information is not intended to replace advice given to you by your health care provider. Make sure you discuss any questions you have with your health care provider. ° °

## 2014-08-16 NOTE — MAU Provider Note (Cosign Needed)
History     CSN: [id number]  Arrival date and time: 08/16/14 1737   None     Chief Complaint  Patient presents with  . Abdominal Pain  . Constipation   HPI  Jeremiah Tarpley is a 28 y.o. [id number] at [redacted]w[redacted]d who presents to maternity admissions for evaluation of left sided abdominal pain. Has a long standing history of constipation and feels like her symptoms are returning. Last normal bowel movement was Friday. Since that time she continues to have daily bowel movements but they are small, hard and difficult to pass. Noticed a small amount of blood on toilet tissue when wiping earlier today. Has not continued to bleed. Took one dose of miralax yesterday without improvement. Denies fevers, chills, chest pain, shortness of breath, nausea, vomiting, dysuria, or other concerning symptoms.   +Fetal movement. Denies uterine contractions, loss of fluid, vaginal bleeding.   Past Medical History  Diagnosis Date  . Diverticulitis     Past Surgical History  Procedure Laterality Date  . Cholecystectomy    . Cesarean section    . Cesarean section N/A 2006    Family History  Problem Relation Age of Onset  . Hyperlipidemia Mother   . Diabetes Maternal Uncle   . Diabetes Paternal Grandmother     History  Substance Use Topics  . Smoking status: Never Smoker   . Smokeless tobacco: Never Used  . Alcohol Use: No    Allergies: No Known Allergies  Prescriptions prior to admission  Medication Sig Dispense Refill Last Dose  . albuterol (PROVENTIL HFA;VENTOLIN HFA) 108 (90 BASE) MCG/ACT inhaler Inhale 4 puffs into the lungs every 6 (six) hours as needed for wheezing or shortness of breath. 1 Inhaler 2 08/16/2014 at Unknown time  . HYDROmorphone (DILAUDID) 2 MG tablet Take 1 tablet (2 mg total) by mouth every 6 (six) hours as needed for severe pain. 15 tablet 0 08/15/2014 at Unknown time  . polyethylene glycol (MIRALAX / GLYCOLAX) packet Take 17 g by mouth as needed for mild constipation.    08/15/2014 at Unknown time  . Prenatal Vit-Fe Fumarate-FA (MULTIVITAMIN-PRENATAL) 27-0.8 MG TABS tablet Take 1 tablet by mouth daily at 12 noon.   08/16/2014 at Unknown time  . oxyCODONE-acetaminophen (PERCOCET/ROXICET) 5-325 MG per tablet Take 1-2 tablets by mouth every 6 (six) hours as needed for moderate pain or severe pain. (Patient not taking: Reported on 08/16/2014) 30 tablet 0 Taking    Review of Systems  Constitutional: Negative.  Negative for fever, chills and malaise/fatigue.  HENT: Negative.  Negative for congestion and sore throat.   Eyes: Negative.  Negative for double vision and photophobia.  Respiratory: Negative.  Negative for cough, shortness of breath and wheezing.   Cardiovascular: Negative.  Negative for chest pain and leg swelling.  Gastrointestinal: Positive for constipation. Negative for nausea, vomiting, abdominal pain and diarrhea.  Genitourinary: Negative.  Negative for dysuria, urgency, frequency and hematuria.  Musculoskeletal: Negative.  Negative for myalgias.  Skin: Negative.   Neurological: Negative.  Negative for weakness and headaches.  Psychiatric/Behavioral: Negative.   All other systems reviewed and are negative.  Physical Exam   Blood pressure 119/68, pulse 78, temperature 98.4 F (36.9 C), temperature source Oral, resp. rate 18, last menstrual period 11/27/2013, SpO2 100 %.  Physical Exam  Nursing note and vitals reviewed. Constitutional: She is oriented to person, place, and time. She appears well-developed and well-nourished. No distress.  HENT:  Head: Normocephalic and atraumatic.  Cardiovascular: Normal rate.   Respiratory: Effort  normal.  GI: Soft. She exhibits no distension. There is no tenderness. There is no rebound and no guarding.  Genitourinary: Rectal exam shows fissure.  Neurological: She is alert and oriented to person, place, and time.  Skin: Skin is warm and dry.  Psychiatric: She has a normal mood and affect. Her behavior is  normal.    MAU Course  Procedures  MDM NST reactive  Assessment and Plan  Mathilda Maguire is a 28 y.o. [id number] at [redacted]w[redacted]d who presents with left lower quadrant abdominal pain and constipation for the past several days. No red flag symptoms on history or examination.   # Constipation - Small rectal fissure noted on examination. Hemostatic.  - Recommend miralax 17 gm in 8 oz of fluid daily.  - Follow up in clinic in 1 week as scheduled.  - Seek care sooner if she develops severe abdominal pain, intractable nausea/vomiting, inability to keep down fluids, not passing gas, rectal bleeding, or other concerning symptoms.  - Not in labor.  - Reviewed PTL/FM precautions.   Shelbie Hutching 08/16/2014, 7:08 PM

## 2014-08-18 ENCOUNTER — Emergency Department (INDEPENDENT_AMBULATORY_CARE_PROVIDER_SITE_OTHER)
Admission: EM | Admit: 2014-08-18 | Discharge: 2014-08-18 | Disposition: A | Discharge status: Nursing Home (Includes ICF) | Payer: Self-pay | Source: Home / Self Care | Attending: Emergency Medicine | Admitting: Emergency Medicine

## 2014-08-18 ENCOUNTER — Encounter (HOSPITAL_COMMUNITY): Payer: Self-pay | Admitting: *Deleted

## 2014-08-18 ENCOUNTER — Inpatient Hospital Stay (HOSPITAL_COMMUNITY)
Admission: AD | Admit: 2014-08-18 | Discharge: 2014-08-18 | Disposition: A | Discharge status: Home / Self Care | Payer: Medicaid Other | Source: Ambulatory Visit | Attending: Obstetrics & Gynecology | Admitting: Obstetrics & Gynecology

## 2014-08-18 DIAGNOSIS — O26899 Other specified pregnancy related conditions, unspecified trimester: Secondary | ICD-10-CM

## 2014-08-18 DIAGNOSIS — O99613 Diseases of the digestive system complicating pregnancy, third trimester: Secondary | ICD-10-CM

## 2014-08-18 DIAGNOSIS — R112 Nausea with vomiting, unspecified: Secondary | ICD-10-CM

## 2014-08-18 DIAGNOSIS — O9989 Other specified diseases and conditions complicating pregnancy, childbirth and the puerperium: Secondary | ICD-10-CM

## 2014-08-18 DIAGNOSIS — K5900 Constipation, unspecified: Secondary | ICD-10-CM

## 2014-08-18 DIAGNOSIS — K625 Hemorrhage of anus and rectum: Secondary | ICD-10-CM | POA: Insufficient documentation

## 2014-08-18 DIAGNOSIS — Z3A34 34 weeks gestation of pregnancy: Secondary | ICD-10-CM | POA: Insufficient documentation

## 2014-08-18 DIAGNOSIS — K5909 Other constipation: Secondary | ICD-10-CM

## 2014-08-18 DIAGNOSIS — R109 Unspecified abdominal pain: Secondary | ICD-10-CM

## 2014-08-18 DIAGNOSIS — K648 Other hemorrhoids: Secondary | ICD-10-CM

## 2014-08-18 HISTORY — DX: Tubulo-interstitial nephritis, not specified as acute or chronic: N12

## 2014-08-18 MED ORDER — DOCUSATE SODIUM 100 MG PO CAPS: 100.0000 mg | ORAL_CAPSULE | Freq: Two times a day (BID) | ORAL | Status: DC | PRN

## 2014-08-18 NOTE — MAU Note (Addendum)
Was seen here 2 days ago for rectal bleeding. States she is still having rectal bleeding this AM. Was sent from Urgent Care for evaluation because she told them she was bleeding. C/O pain in upper abdomen and L side.

## 2014-08-18 NOTE — ED Notes (Addendum)
Pt  Is   [redacted]  Weeks  Pregnant    C/o  abd  Pain  Low   Cramping  In nature      Had   Some what  She  Describes  As   Rectal  Bleeding  For  sev  Days           Has had constipation    As well  As vomiting  As  Well

## 2014-08-18 NOTE — Discharge Instructions (Signed)
Hemorroides  (Hemorrhoids) Las hemorroides son venas inflamadas alrededor del recto o del ano. Hay dos tipos de hemorroides:   Hemorroides internas. Se forman en las venas del interior del recto. Pueden abultarse hacia el exterior e irritarse y Secretary/administrator.  Hemorroides externas. Se producen en las venas externas al ano y pueden sentirse como un bulto o zona hinchada dura y dolorosa cerca del ano. CAUSAS   Embarazo.   Obesidad.   Constipacin o diarrea.   Dificultad para mover el intestino.   Permanecer sentado durante largos perodos en el inodoro.  Levantar objetos pesados u otras actividades que impliquen esfuerzo.  Sexo anal. SNTOMAS   Dolor.   Picazn o irritacin anal.   Sangrado rectal.   Prdida fecal.   Inflamacin anal.   Uno o ms bultos en la zona anal.  DIAGNSTICO  El mdico puede diagnosticar las hemorroides mediante un examen visual. Otros estudios o anlisis que se pueden realizar son:   Examen de la zona rectal con Ardelia Mems mano enguantada (examen digital rectal).   Examen del canal anal utilizando un pequeo tubo (endoscopio).   Anlisis de sangre si ha perdido [place] cantidad significativa de [place].  Un estudio para observar el interior del colon (sigmoideoscopa o colonoscopa). TRATAMIENTO  La mayora de las hemorroides pueden tratarse en casa. Sin embargo, si los sntomas no mejoran o tiene Nurse, children's, el mdico puede Optometrist un procedimiento para disminuir las hemorroides o extirparlas completamente. Los tratamientos posibles son:   Colocacin de una banda de goma en la base de la hemorroide para cortar la circulacin (ligadura con Forensic psychologist).   Inyeccin de una sustancia qumica para Neurosurgeon las hemorroides (escleroterapia).   Utilizacin de un instrumento para quemar las hemorroides (terapia con luz infrarroja).   Extirpacin quirrgica de la hemorroides (hemorroidectoma).   Colocacin de grapas en la hemorroides para  bloquear el flujo de sangre a los tejidos (engrapado de hemorroides).  INSTRUCCIONES PARA EL CUIDADO EN EL HOGAR   Consuma alimentos con fibra, como cereales integrales, legumbres, frutos secos, frutas y verduras. Pregntele a su mdico acerca de tomar productos con fibra aadida en ellos (suplementos defibra).  Aumente la ingestin de lquidos. Beba gran cantidad de lquido para mantener la orina de tono claro o color amarillo plido.   Haga ejercicios regularmente.   Vaya al bao cuando sienta la necesidad de mover el intestino. No espere.   Evite hacer fuerza al mover el intestino.   Mantenga la zona anal limpia y seca. Use papel higinico hmedo o toallitas humedecidas despus de mover el intestino.   Puede usar o Midwife segn las indicaciones algunas cremas especiales y supositorios.   Tome slo medicamentos de venta libre o recetados, segn las indicaciones del mdico.   Tome baos de asiento tibios durante 15-20 minutos, 3-4 veces por da para Glass blower/designer y las [place].   Coloque una bolsa de hielo sobre las hemorroides si le duelen o se hinchan. Usar compresas de [employer] baos de asiento puede ser [place].   Ponga el hielo en una bolsa plstica.   Colquese una toalla entre la piel y la bolsa de hielo.   Deje el hielo durante 15 - 20 minutos y aplquelo 3 - 4 veces por Training and development officer.   No utilice una almohada en forma de aro ni se siente en el inodoro durante perodos prolongados. Esto aumenta la afluencia de sangre y Conservation officer, historic buildings.  SOLICITE ATENCIN MDICA SI:   Aumenta el dolor y la hinchazn y no  puede controlarlo con la medicacin o con un tratamiento.  Tiene un sangrado que no puede controlar.  No puede mover el intestino.  Siente dolor o tiene inflamacin fuera de la zona de las hemorroides. ASEGRESE DE QUE:   Comprende estas instrucciones.  Controlar su enfermedad.  Recibir ayuda de inmediato si no mejora o si empeora. Document  Released: 09/03/2005 Document Revised: 05/06/2013 [employer] Patient Information 2015 [employer]. This information is not intended to replace advice given to you by your health care provider. Make sure you discuss any questions you have with your health care provider.  

## 2014-08-18 NOTE — ED Provider Notes (Signed)
CSN: [id number]     Arrival date & time 08/18/14  1139 History   First MD Initiated Contact with Patient 08/18/14 1234     Chief Complaint  Patient presents with  . Abdominal Pain   (Consider location/radiation/quality/duration/timing/severity/associated sxs/prior Treatment) HPI            28 year old female, [redacted] weeks pregnant, presents for evaluation of abdominal pain. She has had abdominal pain for about 10 days. 2 days after the abdominal pain started she had vaginal bleeding. This was evaluated with an ultrasound did not reveal a source of her bleeding. Since then the abdominal pain has worsened. Also she has constipation, she had not had a bowel movement in 5 days. Any attempted bowel movement results and rectal pain and slight bleeding. She has had vomiting a few times over the last 2 days, this is abnormal, she has not had any vomiting since very early in the pregnancy. She was seen for this issue at [facility] hospital 2 days ago and was told to follow-up if it did not get better, she states she does not like [facility] so she came here. She thinks that she may have diverticulitis, she had this once in the past.  Past Medical History  Diagnosis Date  . Diverticulitis    Past Surgical History  Procedure Laterality Date  . Cholecystectomy    . Cesarean section    . Cesarean section N/A 2006   Family History  Problem Relation Age of Onset  . Hyperlipidemia Mother   . Diabetes Maternal Uncle   . Diabetes Paternal Grandmother    History  Substance Use Topics  . Smoking status: Never Smoker   . Smokeless tobacco: Never Used  . Alcohol Use: No   OB History    Gravida Para Term Preterm AB TAB SAB Ectopic Multiple Living   5 2 1 1 2  2   2      Review of Systems  Constitutional: Positive for fever (subjective fever at home yesterday). Negative for chills.  Respiratory: Negative for shortness of breath.   Cardiovascular: Negative for chest pain.  Gastrointestinal: Positive  for nausea, vomiting and constipation. Negative for abdominal pain and diarrhea.  Genitourinary: Positive for vaginal bleeding.  All other systems reviewed and are negative.   Allergies  Review of patient's allergies indicates no known allergies.  Home Medications   Prior to Admission medications   Medication Sig Start Date End Date Taking? Authorizing Provider  albuterol (PROVENTIL HFA;VENTOLIN HFA) 108 (90 BASE) MCG/ACT inhaler Inhale 4 puffs into the lungs every 6 (six) hours as needed for wheezing or shortness of breath. 08/13/14   Cordelia Poche, MD  HYDROmorphone (DILAUDID) 2 MG tablet Take 1 tablet (2 mg total) by mouth every 6 (six) hours as needed for severe pain. 07/17/14   Gwen Pounds, CNM  polyethylene glycol (MIRALAX / GLYCOLAX) packet Take 17 g by mouth as needed for mild constipation.    Historical Provider, MD  Prenatal Vit-Fe Fumarate-FA (MULTIVITAMIN-PRENATAL) 27-0.8 MG TABS tablet Take 1 tablet by mouth daily at 12 noon.    Historical Provider, MD   BP 120/79 mmHg  Pulse 82  Temp(Src) 98.1 F (36.7 C) (Oral)  Resp 14  SpO2 100%  LMP 11/27/2013 Physical Exam  Constitutional: She is oriented to person, place, and time. Vital signs are normal. She appears well-developed and well-nourished. No distress.  HENT:  Head: Normocephalic and atraumatic.  Cardiovascular: Normal rate, regular rhythm and normal heart sounds.   Pulmonary/Chest:  Effort normal and breath sounds normal. No respiratory distress.  Abdominal: There is tenderness (diffuse, worse at the superior portion of the fundus and in the left lower quadrant). There is no CVA tenderness.  Neurological: She is alert and oriented to person, place, and time. She has normal strength. Coordination normal.  Skin: Skin is warm and dry. No rash noted. She is not diaphoretic.  Psychiatric: She has a normal mood and affect. Judgment normal.  Nursing note and vitals reviewed.   ED Course  Procedures (including critical  care time) Labs Review Labs Reviewed - No data to display  Imaging Review No results found.   MDM   1. Abdominal pain affecting pregnancy   2. Non-intractable vomiting with nausea, vomiting of unspecified type   3. Other constipation    The patient has continued pain since having an episode of vaginal bleeding, her ultrasound was normal at that time, however I feel this needs to be repeated. Also constipation with vomiting is concerning for ileus or obstruction. She needs further evaluation at [facility] hospital. I spoken with the provider at [facility] hospital, they're expecting this patient, she will go to [facility] right now.     Liam Graham, PA-C 08/18/14 1319

## 2014-08-23 ENCOUNTER — Inpatient Hospital Stay (HOSPITAL_COMMUNITY): Payer: Medicaid Other

## 2014-08-23 ENCOUNTER — Encounter (HOSPITAL_COMMUNITY): Payer: Self-pay | Admitting: General Practice

## 2014-08-23 ENCOUNTER — Encounter: Payer: Self-pay | Admitting: Obstetrics and Gynecology

## 2014-08-23 ENCOUNTER — Inpatient Hospital Stay (HOSPITAL_COMMUNITY)
Admission: AD | Admit: 2014-08-23 | Discharge: 2014-08-23 | Disposition: A | Discharge status: Home / Self Care | Payer: Medicaid Other | Source: Ambulatory Visit | Attending: Family Medicine | Admitting: Family Medicine

## 2014-08-23 ENCOUNTER — Ambulatory Visit (INDEPENDENT_AMBULATORY_CARE_PROVIDER_SITE_OTHER): Payer: Medicaid Other | Admitting: Obstetrics and Gynecology

## 2014-08-23 VITALS — BP 130/78 | HR 86 | Wt 203.0 lb

## 2014-08-23 DIAGNOSIS — O09893 Supervision of other high risk pregnancies, third trimester: Secondary | ICD-10-CM

## 2014-08-23 DIAGNOSIS — Z3483 Encounter for supervision of other normal pregnancy, third trimester: Secondary | ICD-10-CM

## 2014-08-23 DIAGNOSIS — O212 Late vomiting of pregnancy: Secondary | ICD-10-CM | POA: Insufficient documentation

## 2014-08-23 DIAGNOSIS — O09213 Supervision of pregnancy with history of pre-term labor, third trimester: Secondary | ICD-10-CM

## 2014-08-23 DIAGNOSIS — Z3A35 35 weeks gestation of pregnancy: Secondary | ICD-10-CM | POA: Insufficient documentation

## 2014-08-23 DIAGNOSIS — O3421 Maternal care for scar from previous cesarean delivery: Secondary | ICD-10-CM

## 2014-08-23 DIAGNOSIS — O34219 Maternal care for unspecified type scar from previous cesarean delivery: Secondary | ICD-10-CM

## 2014-08-23 DIAGNOSIS — O4703 False labor before 37 completed weeks of gestation, third trimester: Secondary | ICD-10-CM

## 2014-08-23 DIAGNOSIS — R059 Cough, unspecified: Secondary | ICD-10-CM

## 2014-08-23 DIAGNOSIS — N949 Unspecified condition associated with female genital organs and menstrual cycle: Secondary | ICD-10-CM | POA: Insufficient documentation

## 2014-08-23 DIAGNOSIS — O9989 Other specified diseases and conditions complicating pregnancy, childbirth and the puerperium: Secondary | ICD-10-CM | POA: Insufficient documentation

## 2014-08-23 DIAGNOSIS — R05 Cough: Secondary | ICD-10-CM | POA: Insufficient documentation

## 2014-08-23 DIAGNOSIS — R509 Fever, unspecified: Secondary | ICD-10-CM | POA: Insufficient documentation

## 2014-08-23 LAB — POCT URINALYSIS DIP (DEVICE)
BILIRUBIN URINE: NEGATIVE
GLUCOSE, UA: NEGATIVE mg/dL
KETONES UR: NEGATIVE mg/dL
LEUKOCYTES UA: NEGATIVE
Nitrite: NEGATIVE
Protein, ur: NEGATIVE mg/dL
Specific Gravity, Urine: 1.015 (ref 1.005–1.030)
Urobilinogen, UA: 0.2 mg/dL (ref 0.0–1.0)
pH: 6 (ref 5.0–8.0)

## 2014-08-23 LAB — CBC
HCT: 33.3 % — ABNORMAL LOW (ref 36.0–46.0)
Hemoglobin: 11.1 g/dL — ABNORMAL LOW (ref 12.0–15.0)
MCH: 30.2 pg (ref 26.0–34.0)
MCHC: 33.3 g/dL (ref 30.0–36.0)
MCV: 90.5 fL (ref 78.0–100.0)
Platelets: 222 10*3/uL (ref 150–400)
RBC: 3.68 MIL/uL — AB (ref 3.87–5.11)
RDW: 13.5 % (ref 11.5–15.5)
WBC: 10.6 10*3/uL — ABNORMAL HIGH (ref 4.0–10.5)

## 2014-08-23 LAB — URINALYSIS, ROUTINE W REFLEX MICROSCOPIC
Bilirubin Urine: NEGATIVE
Glucose, UA: NEGATIVE mg/dL
Hgb urine dipstick: NEGATIVE
Ketones, ur: NEGATIVE mg/dL
Leukocytes, UA: NEGATIVE
NITRITE: NEGATIVE
PH: 6.5 (ref 5.0–8.0)
Protein, ur: NEGATIVE mg/dL
SPECIFIC GRAVITY, URINE: 1.015 (ref 1.005–1.030)
UROBILINOGEN UA: 0.2 mg/dL (ref 0.0–1.0)

## 2014-08-23 LAB — BASIC METABOLIC PANEL
ANION GAP: 13 (ref 5–15)
BUN: 10 mg/dL (ref 6–23)
CALCIUM: 9.4 mg/dL (ref 8.4–10.5)
CO2: 20 mEq/L (ref 19–32)
Chloride: 103 mEq/L (ref 96–112)
Creatinine, Ser: 0.58 mg/dL (ref 0.50–1.10)
GFR calc Af Amer: >90 mL/min (ref >90)
Glucose, Bld: 95 mg/dL (ref 70–99)
Potassium: 4 mEq/L (ref 3.7–5.3)
SODIUM: 136 meq/L — AB (ref 137–147)

## 2014-08-23 LAB — INFLUENZA PANEL BY PCR (TYPE A & B)
H1N1 flu by pcr: NOT DETECTED
INFLAPCR: NEGATIVE
Influenza B By PCR: NEGATIVE

## 2014-08-23 MED ORDER — BENZONATATE 100 MG PO CAPS
100.0000 mg | ORAL_CAPSULE | Freq: Three times a day (TID) | ORAL | Status: DC | PRN
  Administered 2014-08-23: 100 mg via ORAL
  Filled 2014-08-23 (×2): qty 1

## 2014-08-23 MED ORDER — PROMETHAZINE HCL 25 MG PO TABS: 25.0000 mg | ORAL_TABLET | Freq: Four times a day (QID) | ORAL | Status: DC | PRN

## 2014-08-23 MED ORDER — LACTATED RINGERS IV BOLUS (SEPSIS)
1000.0000 mL | Freq: Once | INTRAVENOUS | Status: AC
  Administered 2014-08-23: 1000 mL via INTRAVENOUS

## 2014-08-23 MED ORDER — BENZONATATE 100 MG PO CAPS: 100.0000 mg | ORAL_CAPSULE | Freq: Three times a day (TID) | ORAL | Status: DC | PRN

## 2014-08-23 MED ORDER — ONDANSETRON HCL 4 MG/2ML IJ SOLN
4.0000 mg | Freq: Once | INTRAMUSCULAR | Status: AC
  Administered 2014-08-23: 4 mg via INTRAVENOUS
  Filled 2014-08-23: qty 2

## 2014-08-23 NOTE — MAU Provider Note (Cosign Needed)
History     CSN: [id number]  Arrival date and time: 08/23/14 7564   None     Chief Complaint  Patient presents with  . Contractions   HPI  Referred from clinic to rule out preterm labor after reporting contractions.  Cough: x1 week, using inhaler very regularly up to every 6 hours with minimal improvement, reports fever tmax 100 few days ago.  Also some nausea/vomiting with last emesis clear this morning.    Has some pelvic pressure and pain she thinks might be contractions  Past Medical History  Diagnosis Date  . Diverticulitis   . Pyelonephritis     Past Surgical History  Procedure Laterality Date  . Cholecystectomy    . Cesarean section    . Cesarean section N/A 2006    Family History  Problem Relation Age of Onset  . Hyperlipidemia Mother   . Diabetes Maternal Uncle   . Diabetes Paternal Grandmother     History  Substance Use Topics  . Smoking status: Never Smoker   . Smokeless tobacco: Never Used  . Alcohol Use: No    Allergies: No Known Allergies  Prescriptions prior to admission  Medication Sig Dispense Refill Last Dose  . Prenatal Vit-Fe Fumarate-FA (MULTIVITAMIN-PRENATAL) 27-0.8 MG TABS tablet Take 1 tablet by mouth daily at 12 noon.   08/22/2014 at Unknown time  . albuterol (PROVENTIL HFA;VENTOLIN HFA) 108 (90 BASE) MCG/ACT inhaler Inhale 4 puffs into the lungs every 6 (six) hours as needed for wheezing or shortness of breath. 1 Inhaler 2 prn  . HYDROmorphone (DILAUDID) 2 MG tablet Take 1 tablet (2 mg total) by mouth every 6 (six) hours as needed for severe pain. (Patient not taking: Reported on 08/23/2014) 15 tablet 0 Not Taking    Review of Systems  Constitutional: Negative for fever and chills.  Respiratory: Positive for cough and shortness of breath. Negative for wheezing.   Cardiovascular: Negative for chest pain and leg swelling.  Gastrointestinal: Positive for nausea and vomiting. Negative for heartburn and diarrhea.  Genitourinary:  Negative for dysuria, urgency, frequency and hematuria.  Neurological:       No headache   Physical Exam   Blood pressure 120/78, pulse 98, resp. rate 99, height 5' 0.43" (1.535 m), weight 203 lb 12.8 oz (92.443 kg), last menstrual period 11/27/2013, SpO2 99 %.  Physical Exam  Constitutional: She appears well-developed and well-nourished.  Moderately ill appearing  HENT:  Head: Normocephalic and atraumatic.  Eyes: Conjunctivae and EOM are normal.  Neck: Normal range of motion.  Cardiovascular: Normal rate.   Respiratory: Effort normal. No respiratory distress.  GI: Soft. She exhibits no distension. There is no tenderness.  Musculoskeletal: Normal range of motion. She exhibits no edema.  Neurological: She is alert.  Skin: Skin is warm and dry. No erythema.    MAU Course  Procedures  MDM NST reactive, contractions q2-q61min, very irregular with uterine irritability.  Difficult to trace 2/2 pt coughing and moving toco.  Labs: Results for orders placed or performed during the hospital encounter of 08/23/14 (from the past 24 hour(s))  Urinalysis, Routine w reflex microscopic   Collection Time: 08/23/14  9:05 AM  Result Value Ref Range   Color, Urine YELLOW YELLOW   APPearance CLEAR CLEAR   Specific Gravity, Urine 1.015 1.005 - 1.030   pH 6.5 5.0 - 8.0   Glucose, UA NEGATIVE NEGATIVE mg/dL   Hgb urine dipstick NEGATIVE NEGATIVE   Bilirubin Urine NEGATIVE NEGATIVE   Ketones, ur NEGATIVE NEGATIVE  mg/dL   Protein, ur NEGATIVE NEGATIVE mg/dL   Urobilinogen, UA 0.2 0.0 - 1.0 mg/dL   Nitrite NEGATIVE NEGATIVE   Leukocytes, UA NEGATIVE NEGATIVE  CBC   Collection Time: 08/23/14 10:30 AM  Result Value Ref Range   WBC 10.6 (H) 4.0 - 10.5 K/uL   RBC 3.68 (L) 3.87 - 5.11 MIL/uL   Hemoglobin 11.1 (L) 12.0 - 15.0 g/dL   HCT 33.3 (L) 36.0 - 46.0 %   MCV 90.5 78.0 - 100.0 fL   MCH 30.2 26.0 - 34.0 pg   MCHC 33.3 30.0 - 36.0 g/dL   RDW 13.5 11.5 - 15.5 %   Platelets 222 150 - 400  K/uL  Basic metabolic panel   Collection Time: 08/23/14 10:30 AM  Result Value Ref Range   Sodium 136 (L) 137 - 147 mEq/L   Potassium 4.0 3.7 - 5.3 mEq/L   Chloride 103 96 - 112 mEq/L   CO2 20 19 - 32 mEq/L   Glucose, Bld 95 70 - 99 mg/dL   BUN 10 6 - 23 mg/dL   Creatinine, Ser 0.58 0.50 - 1.10 mg/dL   Calcium 9.4 8.4 - 10.5 mg/dL   GFR calc non Af Amer >90 >90 mL/min   GFR calc Af Amer >90 >90 mL/min   Anion gap 13 5 - 15  Results for orders placed or performed in visit on 08/23/14 (from the past 24 hour(s))  POCT urinalysis dip (device)   Collection Time: 08/23/14  8:14 AM  Result Value Ref Range   Glucose, UA NEGATIVE NEGATIVE mg/dL   Bilirubin Urine NEGATIVE NEGATIVE   Ketones, ur NEGATIVE NEGATIVE mg/dL   Specific Gravity, Urine 1.015 1.005 - 1.030   Hgb urine dipstick TRACE (A) NEGATIVE   pH 6.0 5.0 - 8.0   Protein, ur NEGATIVE NEGATIVE mg/dL   Urobilinogen, UA 0.2 0.0 - 1.0 mg/dL   Nitrite NEGATIVE NEGATIVE   Leukocytes, UA NEGATIVE NEGATIVE    Imaging Studies:  Dg Chest 2 View  08/23/2014   CLINICAL DATA:  Cough for 4 weeks.  Fever.  [redacted] weeks pregnant.  EXAM: CHEST  2 VIEW  COMPARISON:  07/31/2014  FINDINGS: The heart size and mediastinal contours are within normal limits. Both lungs are clear. The visualized skeletal structures are unremarkable.  IMPRESSION: No active cardiopulmonary disease.   Electronically Signed   By: Earle Gell M.D.   On: 08/23/2014 10:19   Dg Chest 2 View  07/31/2014   CLINICAL DATA:  28 year old pregnant female with wheezing.  EXAM: CHEST  2 VIEW  COMPARISON:  04/14/2014 chest radiograph  FINDINGS: The cardiomediastinal silhouette is unremarkable.  Mild peribronchial thickening is unchanged.  There is no evidence of focal airspace disease, pulmonary edema, suspicious pulmonary nodule/mass, pleural effusion, or pneumothorax. No acute bony abnormalities are identified.  IMPRESSION: No evidence of acute cardiopulmonary disease.   Electronically  Signed   By: Hassan Rowan M.D.   On: 07/31/2014 21:05   US Ob Limited  08/07/2014   OBSTETRICAL ULTRASOUND: This exam was performed within a [place] [facility]. The OB US report was generated in the AS system, and faxed to the ordering physician.   This report is available in the [facility]. See the AS Obstetric US report via the Image Link.  US Ob Limited  08/03/2014   OBSTETRICAL ULTRASOUND: This exam was performed within a [place] [facility] Department. The OB US report was generated in the AS system, and faxed to the ordering physician.  This report is available in the [facility]. See the AS Obstetric US report via the Image Link.  US Ob Follow Up  08/03/2014   OBSTETRICAL ULTRASOUND: This exam was performed within a [place] [facility] Department. The OB US report was generated in the AS system, and faxed to the ordering physician.   This report is available in the [facility]. See the AS Obstetric US report via the Image Link.  US Fetal Bpp W/o Non Stress  08/03/2014   OBSTETRICAL ULTRASOUND: This exam was performed within a [place] [facility] Department. The OB US report was generated in the AS system, and faxed to the ordering physician.   This report is available in the [facility]. See the AS Obstetric US report via the Image Link.     Assessment and Plan  Patient is 28 y.o. [id number] [redacted]w[redacted]d reporting cough, nausea/vomiting, pelvic pressure concerning for preterm labor and influenza - preterm labor precautions => exam by me and Malachy Mood RN to confirm cervix is closed - peripheral IV, 1L LR bolus and 4mg  zofran => reported feeling better - cough: cxr 2 v normal, CBC normal, viral PCR pending.  If normal will discharge. - oral rehydration encouraged   ACOSTA,KRISTY [name] 08/23/2014, 11:54 AM   Care transferred to Dr. Angelica Chessman at 1230pm, discharge pending influenza PCR, may discharge with phenergan po rx, tessalon perles  Assumed care from Dr. Deniece Ree at  1230pm. Influenza PCR negative. NST reactive. Feeling better after fluids, zofran here. Work up negative and reassuring. Plan to discharge home with phenergan PO, Tessalon Perles. Rx sent to pharmacy. Encouraged oral rehydration. FM/PTL precautions reviewed.   Shelbie Hutching 08/23/2014, 1:53 PM

## 2014-08-23 NOTE — Discharge Instructions (Signed)
Tos - Adultos  (Cough, Adult)  La tos es un reflejo. Ayuda a limpiar la garganta y las vas areas. Ayuda a que el organismo se cure. La tos puede durar entre 2  3 semanas (aguda) o puede durar ms de 8 semanas (crnica) Las causas ms frecuentes son las infecciones, las alergias o los resfros.  CUIDADOS EN EL HOGAR   Slo tome la medicacin segn las indicaciones.  Si le indican medicamentos (antibiticos), tmelos tal como se le indic. Tmelos todos, aunque se sienta mejor.  Coloque un vaporizador o humidificador de niebla fra en su casa. Esto puede ayudar a Software engineer (secreciones).  Duerma de modo que quede casi sentado (semi erguido). Use almohadas para lograr esta posicin. Esto ayuda a disminuir la tos.  Descanse todo lo que pueda.  Si fuma, abandone el hbito. SOLICITE AYUDA DE INMEDIATO SI:   Observa una secrecin de color blanco amarillento (pus) en el moco.  La tos empeora.  El medicamento no disminuye la tos y no puede dormir.  Tose y escupe sangre.  Tiene dificultad para respirar.  Siente un dolor ms intenso y los medicamentos no [employer].  Tiene fiebre. ASEGRESE DE QUE:   Comprende estas instrucciones.  Controlar su enfermedad.  Solicitar ayuda de inmediato si no mejora o si empeora. Document Released: 05/17/2011 Document Revised: 11/26/2011 [facility] Patient Information 2015 [place]. This information is not intended to replace advice given to you by your health care provider. Make sure you discuss any questions you have with your health care provider.

## 2014-08-23 NOTE — Progress Notes (Signed)
Patient complaining of contractions since yesterday evening every 5- 8 minutes. Patient states she is uncomfortable with a lot of pelvic pressure. Patient sent to MAU to rule out preterm labor. Patient scheduled for repeat c-section with BTL 09/21/2014. If discharged, RTC in 1 week for cultures

## 2014-08-23 NOTE — Progress Notes (Signed)
I assisted Malachy Mood, RN with questions.  [place]  Interpreter.

## 2014-08-24 ENCOUNTER — Inpatient Hospital Stay (HOSPITAL_COMMUNITY)
Admission: AD | Admit: 2014-08-24 | Discharge: 2014-08-31 | DRG: 782 | Disposition: A | Discharge status: Home / Self Care | Payer: Medicaid Other | Source: Ambulatory Visit | Attending: Obstetrics and Gynecology | Admitting: Obstetrics and Gynecology

## 2014-08-24 DIAGNOSIS — O09893 Supervision of other high risk pregnancies, third trimester: Secondary | ICD-10-CM

## 2014-08-24 DIAGNOSIS — O4690 Antepartum hemorrhage, unspecified, unspecified trimester: Secondary | ICD-10-CM | POA: Diagnosis present

## 2014-08-24 DIAGNOSIS — O4593 Premature separation of placenta, unspecified, third trimester: Secondary | ICD-10-CM | POA: Diagnosis present

## 2014-08-24 DIAGNOSIS — O99213 Obesity complicating pregnancy, third trimester: Secondary | ICD-10-CM | POA: Diagnosis present

## 2014-08-24 DIAGNOSIS — O09213 Supervision of pregnancy with history of pre-term labor, third trimester: Secondary | ICD-10-CM

## 2014-08-24 DIAGNOSIS — Z9049 Acquired absence of other specified parts of digestive tract: Secondary | ICD-10-CM | POA: Diagnosis present

## 2014-08-24 DIAGNOSIS — O4693 Antepartum hemorrhage, unspecified, third trimester: Principal | ICD-10-CM | POA: Diagnosis present

## 2014-08-24 DIAGNOSIS — O3421 Maternal care for scar from previous cesarean delivery: Secondary | ICD-10-CM | POA: Diagnosis present

## 2014-08-24 DIAGNOSIS — Z3483 Encounter for supervision of other normal pregnancy, third trimester: Secondary | ICD-10-CM

## 2014-08-24 DIAGNOSIS — Z3A36 36 weeks gestation of pregnancy: Secondary | ICD-10-CM | POA: Diagnosis not present

## 2014-08-24 DIAGNOSIS — Z3A35 35 weeks gestation of pregnancy: Secondary | ICD-10-CM | POA: Diagnosis present

## 2014-08-24 DIAGNOSIS — Z6839 Body mass index (BMI) 39.0-39.9, adult: Secondary | ICD-10-CM

## 2014-08-24 DIAGNOSIS — Z302 Encounter for sterilization: Secondary | ICD-10-CM | POA: Diagnosis not present

## 2014-08-24 DIAGNOSIS — O34219 Maternal care for unspecified type scar from previous cesarean delivery: Secondary | ICD-10-CM

## 2014-08-25 ENCOUNTER — Encounter (HOSPITAL_COMMUNITY): Payer: Self-pay | Admitting: *Deleted

## 2014-08-25 DIAGNOSIS — O4690 Antepartum hemorrhage, unspecified, unspecified trimester: Secondary | ICD-10-CM | POA: Diagnosis present

## 2014-08-25 LAB — URINE MICROSCOPIC-ADD ON

## 2014-08-25 LAB — URINALYSIS, ROUTINE W REFLEX MICROSCOPIC
Bilirubin Urine: NEGATIVE
GLUCOSE, UA: NEGATIVE mg/dL
Ketones, ur: NEGATIVE mg/dL
Leukocytes, UA: NEGATIVE
Nitrite: NEGATIVE
Protein, ur: NEGATIVE mg/dL
SPECIFIC GRAVITY, URINE: 1.015 (ref 1.005–1.030)
Urobilinogen, UA: 0.2 mg/dL (ref 0.0–1.0)
pH: 7.5 (ref 5.0–8.0)

## 2014-08-25 LAB — TYPE AND SCREEN
ABO/RH(D): O POS
Antibody Screen: NEGATIVE

## 2014-08-25 LAB — WET PREP, GENITAL
Clue Cells Wet Prep HPF POC: NONE SEEN
Trich, Wet Prep: NONE SEEN
WBC, Wet Prep HPF POC: NONE SEEN
YEAST WET PREP: NONE SEEN

## 2014-08-25 MED ORDER — DOCUSATE SODIUM 100 MG PO CAPS
100.0000 mg | ORAL_CAPSULE | Freq: Every day | ORAL | Status: DC
  Administered 2014-08-26 – 2014-08-31 (×6): 100 mg via ORAL
  Filled 2014-08-25 (×6): qty 1

## 2014-08-25 MED ORDER — CALCIUM CARBONATE ANTACID 500 MG PO CHEW: 2.0000 | CHEWABLE_TABLET | ORAL | Status: DC | PRN

## 2014-08-25 MED ORDER — ALBUTEROL SULFATE (2.5 MG/3ML) 0.083% IN NEBU: 3.0000 mL | INHALATION_SOLUTION | Freq: Four times a day (QID) | RESPIRATORY_TRACT | Status: DC | PRN

## 2014-08-25 MED ORDER — PROMETHAZINE HCL 25 MG PO TABS: 25.0000 mg | ORAL_TABLET | Freq: Four times a day (QID) | ORAL | Status: DC | PRN

## 2014-08-25 MED ORDER — ZOLPIDEM TARTRATE 5 MG PO TABS
5.0000 mg | ORAL_TABLET | Freq: Every evening | ORAL | Status: DC | PRN
  Administered 2014-08-26 – 2014-08-27 (×2): 5 mg via ORAL
  Filled 2014-08-25 (×2): qty 1

## 2014-08-25 MED ORDER — PRENATAL MULTIVITAMIN CH
1.0000 | ORAL_TABLET | Freq: Every day | ORAL | Status: DC
  Administered 2014-08-25 – 2014-08-31 (×7): 1 via ORAL
  Filled 2014-08-25 (×7): qty 1

## 2014-08-25 MED ORDER — BENZONATATE 100 MG PO CAPS
100.0000 mg | ORAL_CAPSULE | Freq: Three times a day (TID) | ORAL | Status: DC | PRN
  Administered 2014-08-25: 100 mg via ORAL
  Filled 2014-08-25 (×2): qty 1

## 2014-08-25 MED ORDER — CYCLOBENZAPRINE HCL 5 MG PO TABS
5.0000 mg | ORAL_TABLET | Freq: Every evening | ORAL | Status: DC | PRN
  Administered 2014-08-25 – 2014-08-29 (×6): 5 mg via ORAL
  Filled 2014-08-25 (×8): qty 1

## 2014-08-25 MED ORDER — ACETAMINOPHEN 325 MG PO TABS
650.0000 mg | ORAL_TABLET | ORAL | Status: DC | PRN
  Administered 2014-08-27 – 2014-08-31 (×5): 650 mg via ORAL
  Filled 2014-08-25 (×5): qty 2

## 2014-08-25 NOTE — MAU Note (Addendum)
PT SAYS  SHE STARTED HURTING      SINCE Sunday  -  WENT TO CLINIC ON Monday  -  THEN SENT  HER HERE  ON Monday-  THEN SHE WENT  HOME.    TODAY AT 530PM-  WHEN SHE WIPED -   REDDISH - B ROWN.   DENIES HSV AND MRSA.     VE ON Monday-   ?           WITH  VIRIA-  INTERPRETER

## 2014-08-25 NOTE — Progress Notes (Signed)
I stopped by to check on patient's needs.  [name] Interpreter. °

## 2014-08-25 NOTE — Progress Notes (Signed)
I stopped by patient's room and ordered her breakfast, by Juliann Mule, Interpreter

## 2014-08-25 NOTE — Progress Notes (Signed)
Eda, Spanish interpreter in to assist.  Pt c/o pulling pain in lower abd and denies bright red VB. PT does state that she is leaking fluid but no fluid noted at perineum.  Peripad placed on pt and fetal monitors applied

## 2014-08-25 NOTE — Progress Notes (Signed)
Interpreter in room. Question and concerns that patient had answered. Patient smiling and receptive.

## 2014-08-25 NOTE — Progress Notes (Signed)
I assisted Elmyra Ricks RN and Ardath Sax RN,  with some questions, by Juliann Mule , Interpreter

## 2014-08-25 NOTE — Progress Notes (Signed)
Ur chart review completed.  

## 2014-08-25 NOTE — Plan of Care (Signed)
Problem: Consults Goal: Antepartum Patient Education Outcome: Completed/Met Date Met:  08/25/14 Goal: Orientation to unit: [place] (smoking, visitation, chaplain services, helpline) Outcome: Completed/Met Date Met:  08/25/14  Problem: Phase I Progression Outcomes Goal: No significant worsening bleeding/cervix change/vag drainage No significant worsening in vaginal bleeding, cervical change, or vaginal drainage. Outcome: Progressing Goal: LOS < 4 days Outcome: Progressing Goal: Contractions < 5-6/hour Outcome: Progressing Goal: Maintains reassuring Fetal Heart Rate Outcome: Completed/Met Date Met:  08/25/14 Goal: Pain controlled with appropriate interventions Outcome: Progressing Goal: OOB as tolerated unless otherwise ordered Outcome: Completed/Met Date Met:  08/25/14 Goal: Initial discharge plan identified Outcome: Completed/Met Date Met:  08/25/14 Goal: Hemodynamically stable Outcome: Progressing  Problem: Phase II Progression Outcomes Goal: Electronic fetal monitoring(Doppler,Continuous,Intermittent) EFM (Doppler, Continuous, Intermittent) Outcome: Completed/Met Date Met:  08/25/14 Goal: Labs/tests as ordered Labs/tests as ordered (Magnesium level, CBG's, CBC, CMET, 24 hr Urine, Amniocentesis, Ultrasound, Other) Outcome: Completed/Met Date Met:  08/25/14 Goal: Medications as ordered (see description) Medications as ordered (Magnesium Sulfate, Steroids, PO Tocolysis, Antibiotics, Terbutaline Pump, Other) Outcome: Completed/Met Date Met:  08/25/14

## 2014-08-25 NOTE — Plan of Care (Signed)
Problem: Phase I Progression Outcomes Goal: Pain controlled with appropriate interventions Outcome: Progressing     

## 2014-08-25 NOTE — H&P (Signed)
Sydelle Sherfield is a 28 y.o. female [phone] at [redacted]w[redacted]d with h/o 2 previous Cesarean sections presenting for vaginal bleeding.  Patient recently admitted to antenatal at [facility] from 11/16-11/19 (during which time she received 2 doses of betamethasone) and again from 11/20-11/23 for similar complaints.    Patient reports brown-tinged vaginal discharge with small blood clot since this afternoon.  She also has had back pain since Sunday that is worsening.  Pelvic pain present. +FM, no LOF.  +dysuria, + nausea, + cough that is improving.   Clinic  [facility]  Dating  first trimester ultrasound  Genetic Screen declined          Anatomic [place]  Normal growth 62nd%ile.  rec serial sonos for growth  GTT Early:   123            Third trimester: 125  TDaP vaccine 06/29/14  Flu vaccine Declined 06/29/14, then agreed to 07/19/14  GBS   Contraception  IUD  Baby Food  breast  Circumcision Boy, outpt circ  Pediatrician   Support Person      Maternal Medical History:  Reason for admission: Nausea.     OB History    Gravida Para Term Preterm AB TAB SAB Ectopic Multiple Living   5 2 1 1 2  2   2      Past Medical History  Diagnosis Date  . Diverticulitis   . Pyelonephritis    Past Surgical History  Procedure Laterality Date  . Cholecystectomy    . Cesarean section    . Cesarean section N/A 2006   Family History: family history includes Diabetes in her maternal uncle and paternal grandmother; Hyperlipidemia in her mother. Social History:  reports that she has never smoked. She has never used smokeless tobacco. She reports that she does not drink alcohol or use illicit drugs.    Review of Systems  Constitutional: Negative for fever, chills and weight loss.  Eyes: Negative for blurred vision.  Respiratory: Positive for cough. Negative for hemoptysis and shortness of breath.   Cardiovascular: Negative for chest pain and leg swelling.  Gastrointestinal: Positive for nausea. Negative for vomiting.   Genitourinary: Positive for dysuria.  Musculoskeletal: Positive for back pain.  Neurological: Negative for headaches.    Dilation: Closed Exam by:: DR Taytum Scheck Blood pressure 122/71, pulse 83, temperature 99.6 F (37.6 C), temperature source Oral, resp. rate 20, height 5' (1.524 m), weight 92.534 kg (204 lb), last menstrual period 11/27/2013, SpO2 99 %. Maternal Exam:  Uterine Assessment: Contraction strength is mild.  Contraction frequency is irregular.   Abdomen: Surgical scars: low transverse.   Introitus: Normal vulva. Normal vagina.  Ferning test: not done.  Nitrazine test: not done. Amniotic fluid character: not assessed.  Cervix: Cervix evaluated by sterile speculum exam and digital exam.     Fetal Exam Fetal Monitor Review: Baseline rate: 150.  Variability: moderate (6-25 bpm).   Pattern: accelerations present and no decelerations.    Fetal State Assessment: Category I - tracings are normal.     Physical Exam  Constitutional: She is oriented to person, place, and time. She appears well-developed and well-nourished. No distress.  HENT:  Head: Normocephalic and atraumatic.  Cardiovascular: Normal rate.   Respiratory: Effort normal. No respiratory distress.  Genitourinary: Vagina normal.  Cervix closed, brownish discharge present on SSE  Musculoskeletal: She exhibits no edema or tenderness.  Neurological: She is alert and oriented to person, place, and time.  Skin: Skin is warm and dry.    Prenatal  labs: ABO, Rh: --/--/O POS (11/20 1430) Antibody: NEG (11/20 1430) Rubella: 17.40 (06/16 0836) RPR: NON REAC (10/13 1653)  HBsAg: NEGATIVE (06/16 0836)  HIV: NONREACTIVE (10/26 1447)  GBS: Negative (06/16 0000)   Assessment/Plan: Malai Lady is a 28 y.o. [id number] at [redacted]w[redacted]d here for vaginal bleeding.    - Admit to antenatal for observation of vaginal bleeding. - Consent for repeat CS - Wet prep, GC/Chlamydia sent - UA pending as patient reports dysuria -  s/p BMZ x2 (11/16)  Lavon Paganini 08/25/2014, 1:12 AM  OB fellow attestation:  I have seen and examined this patient; I agree with above documentation in the resident's note.   Nikyah Lackman is a 28 y.o. [id number] here for vaginal bleeding  PE: BP 132/71 mmHg  Pulse 85  Temp(Src) 98.6 F (37 C) (Oral)  Resp 18  Ht 5' (1.524 m)  Wt 204 lb (92.534 kg)  BMI 39.84 kg/m2  SpO2 99%  LMP 11/27/2013 Gen: calm comfortable, NAD Resp: normal effort, no distress Abd: gravid SPE: light brown discharge, no active bleeding or blood clots in vault Dilation: Closed Exam by:: DR Stefano Trulson   ROS, labs, PMH reviewed  Plan: MOC: rLTCS ID: GBS culture ordered FWB: cat I Labor: if indicated pt would need repeat cesarean section, I consented her for LTCS/BTS Anticipate 5d admission to monitoring vaginal bleeding, r/o abruption.  This is pt's second admission for this complaint. Likely discharge if no complicating factors 20/25  Keyairra Kolinski [name] 08/25/2014, 8:06 AM

## 2014-08-25 NOTE — Progress Notes (Signed)
Patient ID: Lorraine [name], female   DOB: March 17, 1986, 28 y.o.   MRN: [id number] Patient is complaining of contractions every 10 minutes. She denies any episodes of vaginal bleeding since admission. She reports good fetal movement and denies leakage of fluid  VE: closed/thick/-3  A/P 28 yo [id number] at [redacted]w[redacted]d admitted with third trimester vaginal bleeding - Continue close monitoring - Will keep NPO for now as patient is for a repeat cesarean section with BTL - Continue antepartum care

## 2014-08-25 NOTE — Progress Notes (Signed)
Confirmed with Dr. B that patient was meant to be antenatal admission. Patient brought to antenatal and care transferred to Ardath Sax RN.

## 2014-08-26 DIAGNOSIS — O4693 Antepartum hemorrhage, unspecified, third trimester: Principal | ICD-10-CM

## 2014-08-26 LAB — GC/CHLAMYDIA PROBE AMP
CT Probe RNA: NEGATIVE
GC Probe RNA: NEGATIVE

## 2014-08-26 NOTE — Progress Notes (Signed)
I assisted  Amy,RN with questions. [place]   Interpreter.

## 2014-08-26 NOTE — Progress Notes (Signed)
I assisted Dr Harolyn Rutherford with some questions, by Juliann Mule, Interpreter

## 2014-08-26 NOTE — Progress Notes (Signed)
I assisted Lorraine Janus RN with some questions, I asssited patient with some questions as well, by Juliann Mule, Interpreter

## 2014-08-26 NOTE — Progress Notes (Signed)
Patient ID: Lorraine [name], female   DOB: 1986-07-04, 28 y.o.   MRN: [id number] Patient is Spanish-speaking only, Spanish interpreter present for this encounter. Patient is complaining of rare contractions.  She denies any episodes of vaginal bleeding since admission. She reports good fetal movement and denies leakage of fluid   Blood pressure 125/74, pulse 97, temperature 98.4 F (36.9 C), temperature source Oral, resp. rate 16, height 5' (1.524 m), weight 204 lb (92.534 kg), last menstrual period 11/27/2013, SpO2 99 %.  Reactive NST, no contractions Gen - NAD Abd - NT, ND, gravid Ext - No c/c/e  A/P 28 yo [id number] at [redacted]w[redacted]d admitted with third trimester vaginal bleeding; two previous cesarean sections - Continue close monitoring - Continue antepartum care  Osborne Oman, MD

## 2014-08-27 NOTE — Progress Notes (Signed)
I assisted Genia Hotter with some questions, by Juliann Mule Interpreter

## 2014-08-27 NOTE — Progress Notes (Signed)
I assisted Maudie Mercury, RN with questions. [place]  Interpreter.

## 2014-08-27 NOTE — Progress Notes (Signed)
I assisted  Dr. Roselie Awkward with questions.  [place]  Interpreter.

## 2014-08-27 NOTE — Progress Notes (Signed)
[place]) NOTE  Lorraine [name] is a 28 y.o. [id number] at [redacted]w[redacted]d by best clinical estimate who is admitted for vaginal bleeding.     Length of Stay:  3  Days  Subjective: Scant bloody discharge Patient reports the fetal movement as active. Patient reports uterine contraction  activity as none. Patient reports  vaginal bleeding as scant staining. Patient describes fluid per vagina as None.  Vitals:  Blood pressure 112/69, pulse 97, temperature 98.1 F (36.7 C), temperature source Oral, resp. rate 20, height 5' (1.524 m), weight 92.534 kg (204 lb), last menstrual period 11/27/2013, SpO2 99 %. Physical Examination:  General appearance - alert, well appearing, and in no distress Heart - normal rate and regular rhythm Abdomen - soft, nontender, nondistended Fundal Height:  size equals dates  Extremities: extremities normal,  Membranes:intact   Fetal Heart Rate A      Mode  External [removed] filed at 08/27/2014 0613    Baseline Rate (A)  135 bpm filed at 08/27/2014 0613    Variability  6-25 BPM filed at 08/27/2014 0613    Accelerations  15 x 15 filed at 08/27/2014 6433    Decelerations  Variable filed at 08/27/2014 2951    Multiple birth?  No filed at 08/25/2014 0100     Labs:    Medications:  Scheduled . docusate sodium  100 mg Oral Daily  . prenatal multivitamin  1 tablet Oral Q1200   I have reviewed the patient's current medications.  ASSESSMENT: Patient Active Problem List   Diagnosis Date Noted  . Vaginal bleeding in pregnancy 08/25/2014  . Vaginal bleeding during pregnancy, antepartum   . Obesity affecting pregnancy in third trimester, antepartum   . Pyelonephritis 07/12/2014  . Previous cesarean section complicating pregnancy 88/41/6606  . Supervision of other normal pregnancy, antepartum 03/02/2014  . H/O preterm delivery, currently pregnant 03/02/2014  . Diverticulitis 09/10/2012    PLAN: Expectant management  watching for recurrent bleeding  Lavelle Berland 08/27/2014,7:58 AM

## 2014-08-27 NOTE — Progress Notes (Signed)
I stopped by to check on patient's needs.  Lorraine [name] Interpreter. °

## 2014-08-28 LAB — TYPE AND SCREEN
ABO/RH(D): O POS
Antibody Screen: NEGATIVE

## 2014-08-28 NOTE — Plan of Care (Signed)
Problem: Phase I Progression Outcomes Goal: No significant worsening bleeding/cervix change/vag drainage No significant worsening in vaginal bleeding, cervical change, or vaginal drainage.  Outcome: Progressing Pt. Remains stable. Goal: Contractions < 5-6/hour Outcome: Progressing 5 contractions during NST last evening.

## 2014-08-28 NOTE — Progress Notes (Addendum)
I assisted Dr Glo Herring this morning with some questions and Patty RN during the night, by Juliann Mule Interpreter

## 2014-08-28 NOTE — Progress Notes (Signed)
Interpreter in to check on pt and pt was sleeping, but when awakened said that she was going to walk around in the room to see if it helped her feel better.

## 2014-08-28 NOTE — Progress Notes (Signed)
Patient ID: Lorraine [name], female   DOB: Mar 07, 1986, 28 y.o.   MRN: [id number] [place]) NOTE  Lorraine [name] is a 28 y.o. K2H0623 at [redacted]w[redacted]d  who is admitted for third trimester bleeding, on 08/25/14. This is the third admission. She continues to have mild cramping, and a minmimal lite brown d/c. Marland Kitchen   Fetal presentation is cephalic. Length of Stay:  4  Days  Subjective: Pt mildly uncomfortable in low back, also in suprapubic area, also reports vague leg discomforts, as well as "a little bit of "pain" with examination of cervix, but there is no body reaction to exam Patient reports the fetal movement as active. Patient reports uterine contraction  activity as irregular, every 6-10 minutes. Patient reports  vaginal bleeding as scant staining. Patient describes fluid per vagina as None.  Vitals:  Blood pressure 120/76, pulse 94, temperature 98.3 F (36.8 C), temperature source Oral, resp. rate 20, height 5' (1.524 m), weight 204 lb (92.534 kg), last menstrual period 11/27/2013, SpO2 96 %. Physical Examination:  General appearance - alert, well appearing, and in no distress, overweight and anxious Heart - normal rate and regular rhythm Abdomen - soft, nontender, nondistended Fundal Height:  size equals dates Cervical Exam: Evaluated by digital exam. and found to be <0.5 cm/ 50%/-3 and fetal presentation is cephalic. Extremities: extremities normal, atraumatic, no cyanosis or edema and Homans sign is negative, no sign of DVT with DTRs 2+ bilaterally Membranes:intact  Fetal Monitoring:  Baseline: 145 bpm, Variability: Good {> 6 bpm), Accelerations: Reactive and Decelerations: Absent  Labs:  No results found for this or any previous visit (from the past 24 hour(s)).  Imaging Studies:     Currently EPIC will not allow sonographic studies to automatically populate into notes.  In the meantime, copy and paste results into note or free text.  Medications:   Scheduled . docusate sodium  100 mg Oral Daily  . prenatal multivitamin  1 tablet Oral Q1200   I have reviewed the patient's current medications.  ASSESSMENT: Patient Active Problem List   Diagnosis Date Noted  . Vaginal bleeding in pregnancy 08/25/2014  . Vaginal bleeding during pregnancy, antepartum   . Obesity affecting pregnancy in third trimester, antepartum   . Pyelonephritis 07/12/2014  . Previous cesarean section complicating pregnancy 76/28/3151  . Supervision of other normal pregnancy, antepartum 03/02/2014  . H/O preterm delivery, currently pregnant 03/02/2014  . Diverticulitis 09/10/2012    PLAN: Continued inpatient management until 7 days without bleeding then resume outpatient care. Her peak cesarean section is currently scheduled for 09/21/2014. Tubal ligation is planned at time of cesarean and she has signed tubal papers  Ziaire Hagos V 08/28/2014,7:11 AM

## 2014-08-29 NOTE — Progress Notes (Signed)
Interpreter called, pt complaining of pain

## 2014-08-29 NOTE — Progress Notes (Signed)
Checked on patients needs Spanish Interpreter - Benita Laurin Coder

## 2014-08-29 NOTE — Progress Notes (Signed)
Checked on patients needs. [place]

## 2014-08-29 NOTE — Progress Notes (Signed)
Assisted Dr Glo Herring with interpretation of patient assessment Spanish Interpreter - Waynard Edwards

## 2014-08-29 NOTE — Progress Notes (Signed)
Checked on patients needs and ordered lunch for patient.  [place]

## 2014-08-29 NOTE — Progress Notes (Signed)
Patient ID: Lorraine [name], female   DOB: 12-09-1985, 28 y.o.   MRN: [id number] [place]) NOTE  Lorraine [name] is a 28 y.o. F3O3291 at [redacted]w[redacted]d by best clinical estimate who is admitted for vaginal bleeding.   Fetal presentation is unsure. Length of Stay:  5  Days  Subjective: Reports worsening contractions Patient reports the fetal movement as active. Patient reports uterine contraction  activity as regular, every 7 minutes. Patient reports  vaginal bleeding as scant staining. Patient describes fluid per vagina as Other bloody.  Vitals:  Blood pressure 119/68, pulse 89, temperature 97.9 F (36.6 C), temperature source Axillary, resp. rate 20, height 5' (1.524 m), weight 204 lb (92.534 kg), last menstrual period 11/27/2013, SpO2 96 %. Physical Examination:  General appearance - alert, well appearing, and in no distress Abdomen - gravid NT Fundal Height:  size equals dates Cervical Exam: Evaluated by digital exam. and found to be closed/ 25%/Floating and fetal presentation is unsure. Extremities: Homans sign is negative, no sign of DVT  Membranes:intact  Fetal Monitoring:  Baseline: 150 bpm, Variability: Good {> 6 bpm), Accelerations: Reactive and Decelerations: Absent  Labs:  Results for orders placed or performed during the hospital encounter of 08/24/14 (from the past 24 hour(s))  Type and screen   Collection Time: 08/28/14  4:05 PM  Result Value Ref Range   ABO/RH(D) O POS    Antibody Screen NEG    Sample Expiration 08/31/2014     Medications:  Scheduled . docusate sodium  100 mg Oral Daily  . prenatal multivitamin  1 tablet Oral Q1200   I have reviewed the patient's current medications.  ASSESSMENT: Patient Active Problem List   Diagnosis Date Noted  . Supervision of other normal pregnancy, antepartum 03/02/2014    Priority: High  . Vaginal bleeding during pregnancy, antepartum     Priority: Medium  . Obesity affecting pregnancy  in third trimester, antepartum     Priority: Medium  . Previous cesarean section complicating pregnancy 91/66/0600    Priority: Medium  . H/O preterm delivery, currently pregnant 03/02/2014    Priority: Medium  . Vaginal bleeding in pregnancy 08/25/2014  . Pyelonephritis 07/12/2014  . Diverticulitis 09/10/2012    PLAN: Continue inpatient management until 7 days post bleeding.  Consider discussion with MFM around timing of delivery with chronic abruption.   Lorraine Jude, MD 08/29/2014,7:44 AM

## 2014-08-29 NOTE — Progress Notes (Signed)
Interpreter in and pt c/o waking up and being in pain.  Pt assisted to BR to empty bladder and pt denies having any LOF or VB, none noted at perineum.

## 2014-08-30 ENCOUNTER — Encounter: Payer: Self-pay | Admitting: Obstetrics & Gynecology

## 2014-08-30 DIAGNOSIS — O3421 Maternal care for scar from previous cesarean delivery: Secondary | ICD-10-CM

## 2014-08-30 NOTE — Progress Notes (Signed)
Pt sitting on the side of the bed eating her breakfast

## 2014-08-30 NOTE — Progress Notes (Signed)
Interpreter assisting with assessment

## 2014-08-30 NOTE — Progress Notes (Signed)
Patient ID: Lorraine [name], female   DOB: [birth year], 28 y.o.   MRN: [id number] [place]) NOTE  Lorraine [name] is a 28 y.o. C5E5277 at [redacted]w[redacted]d by best clinical estimate who is admitted for vaginal bleeding.   Fetal presentation is unsure. Length of Stay:  6  Days  Subjective: Reports irregular contractions accompanied with pelvic pressure. Patient asked if she was going to stay hospitalized until delivery, as she doesn't want to be in and out of the hospital. Patient reports the fetal movement as active. Patient reports uterine contraction  activity as irregular. Patient reports  vaginal bleeding as scant staining. Patient describes fluid per vagina as Other bloody.  Vitals:  Blood pressure 119/62, pulse 90, temperature 98.5 F (36.9 C), temperature source Oral, resp. rate 24, height 5' (1.524 m), weight 204 lb (92.534 kg), last menstrual period 11/27/2013, SpO2 100 %. Physical Examination:  General appearance - alert, well appearing, and in no distress Abdomen - gravid NT Fundal Height:  size equals dates Cervical Exam: Not indicated Extremities: Homans sign is negative, no sign of DVT  Membranes:intact  Fetal Monitoring:  Baseline: 150 bpm, Variability: Good {> 6 bpm), Accelerations: Reactive and Decelerations: Absent  [place]- irregular contraction q 5-10 minutes  Labs:  No results found for this or any previous visit (from the past 24 hour(s)).  Medications:  Scheduled . docusate sodium  100 mg Oral Daily  . prenatal multivitamin  1 tablet Oral Q1200   I have reviewed the patient's current medications.  ASSESSMENT: Patient Active Problem List   Diagnosis Date Noted  . Previous cesarean section complicating pregnancy 82/42/3536    Priority: Medium  . Supervision of other normal pregnancy, antepartum 03/02/2014    Priority: Medium  . H/O preterm delivery, currently pregnant 03/02/2014    Priority: Medium  . Vaginal bleeding in pregnancy  08/25/2014  . Vaginal bleeding during pregnancy, antepartum   . Obesity affecting pregnancy in third trimester, antepartum   . Pyelonephritis 07/12/2014  . Diverticulitis 09/10/2012    PLAN: Continue inpatient management until 7 days post bleeding.   Consider discussion with MFM around timing of delivery with chronic abruption.  Continue current care  Tylesha Gibeault, MD 08/30/2014,6:51 AM

## 2014-08-30 NOTE — Progress Notes (Signed)
Stopped by to check on patient's needs and ordered her Lunch.  [place]  Interpreter.

## 2014-08-30 NOTE — Progress Notes (Signed)
I assisted Pam, RN with questions. [place] Interpreter.

## 2014-08-31 ENCOUNTER — Inpatient Hospital Stay (HOSPITAL_COMMUNITY): Payer: Self-pay

## 2014-08-31 DIAGNOSIS — Z3A36 36 weeks gestation of pregnancy: Secondary | ICD-10-CM | POA: Insufficient documentation

## 2014-08-31 DIAGNOSIS — O4593 Premature separation of placenta, unspecified, third trimester: Secondary | ICD-10-CM | POA: Insufficient documentation

## 2014-08-31 NOTE — Progress Notes (Signed)
I stopped to check on patient's needs and ordered her lunch. [place] Interpreter.

## 2014-08-31 NOTE — Progress Notes (Signed)
Patient ID: Lorraine [name], female   DOB: [birth year], 28 y.o.   MRN: [id number] FACULTY PRACTICE ANTEPARTUM NOTE  Lorraine [name] is a 28 y.o. [id number] at [redacted]w[redacted]d  who is admitted for vaginal bleeding 6 days ago.   Fetal presentation is cephalic. Length of Stay:  7  Days  Subjective:  Patient reports good fetal movement.  She reports a couple uterine contractions, brown discharge and no loss of fluid per vagina.  Patient uncomfortable with mild back pain, but declines medication.  Patient also seen with her husband, who accuses our practice of treating her differently due to her lack of insurance.  States that we aren't doing anything to help his wife.  I assured the patient and her husband that it is our normal observation to watch patients who have vaginal bleeding for 7 days to assure no further episodes and that we closely monitor the baby.  I also explained that as the baby's heart tracing is reassuring, I have no further reason to deliver the baby sooner.  Vitals:  Blood pressure 112/63, pulse 85, temperature 98.1 F (36.7 C), temperature source Oral, resp. rate 18, height 5' (1.524 m), weight 204 lb (92.534 kg), last menstrual period 11/27/2013, SpO2 100 %. Physical Examination:  General appearance - alert, well appearing, and in no distress Chest - clear to auscultation, no wheezes, rales or rhonchi, symmetric air entry Heart - normal rate, regular rhythm, normal S1, S2, no murmurs, rubs, clicks or gallops Abdomen - soft, nontender, nondistended, no masses or organomegaly Fundal Height:  size equals dates Extremities: extremities normal, atraumatic, no cyanosis or edema  Membranes:intact  Fetal Monitoring:  Baseline: 150 bpm, Variability: Good {> 6 bpm), Accelerations: Reactive and Decelerations: Absent  Labs:  No results found for this or any previous visit (from the past 24 hour(s)).  Imaging Studies:    none   Medications:  Scheduled . docusate sodium  100 mg Oral Daily  .  prenatal multivitamin  1 tablet Oral Q1200   I have reviewed the patient's current medications.  ASSESSMENT: Patient Active Problem List   Diagnosis Date Noted  . Vaginal bleeding in pregnancy 08/25/2014  . Vaginal bleeding during pregnancy, antepartum   . Obesity affecting pregnancy in third trimester, antepartum   . Pyelonephritis 07/12/2014  . Previous cesarean section complicating pregnancy 08/67/6195  . Supervision of other normal pregnancy, antepartum 03/02/2014  . H/O preterm delivery, currently pregnant 03/02/2014  . Diverticulitis 09/10/2012    PLAN: Continue observation until tomorrow.   If continues to be reassuring, plan on discharge tomorrow. Continue routine antenatal care.   STINSON, JACOB JEHIEL 08/31/2014,6:27 AM

## 2014-08-31 NOTE — Progress Notes (Signed)
I assisted Debbie, RN with explanation of care plan.  Notus  Interpreter.

## 2014-08-31 NOTE — Progress Notes (Signed)
I stopped by patients room to check on her needs, Juliann Mule , Interpreter

## 2014-08-31 NOTE — Discharge Summary (Signed)
Physician Discharge Summary  Patient ID: Lorraine [name] MRN: [id number] DOB/AGE: 1986-01-15 28 y.o.  Admit date: 08/24/2014 Discharge date: 08/31/2014  Admission Diagnoses:  Discharge Diagnoses:  Active Problems:   Vaginal bleeding in pregnancy   Discharged Condition: good  Hospital Course: admitted with reported small amount of dark vaginal bleeding, no blood/clots in vault but did have light brown vaginal discharge at that time.  Patient had been admitted multiple times for similar complaints.  After 7 days of observation she was discharged.  BPP obtained day of discharge pending.  Discussed case with MFM, decided to schedule repeat cesarean section (#3) at [place] 2/2 suspected chronic abruption  Abruption precautions emphasized with patient, she is agreeable to discharge.  Eda discussed plan of care with patient, she was agreeable, no questions; abruption precautions again re-emphasized.  Consults: MFM  Significant Diagnostic Studies: BPP, NST  Treatments: observation  Discharge Exam: Blood pressure 120/56, pulse 88, temperature 98.3 F (36.8 C), temperature source Oral, resp. rate 18, height 5' (1.524 m), weight 204 lb (92.534 kg), last menstrual period 11/27/2013, SpO2 100 %. General appearance: alert and cooperative Back: symmetric, no curvature Resp: no respiratory distress, normal effort Cardio: regular rate GI: soft nontender Extremities: extremities normal, atraumatic, no cyanosis or edema  Disposition: 01-Home or Self Care  Discharge Instructions    Diet - low sodium heart healthy    Complete by:  As directed      Discharge instructions    Complete by:  As directed   Return for any worsening of current symptoms, such as vaginal bleeding, worsening in pain     Increase activity slowly    Complete by:  As directed             Medication List    TAKE these medications        albuterol 108 (90 BASE) MCG/ACT inhaler  Commonly known as:  PROVENTIL  HFA;VENTOLIN HFA  Inhale 4 puffs into the lungs every 6 (six) hours as needed for wheezing or shortness of breath.     benzonatate 100 MG capsule  Commonly known as:  TESSALON  Take 1 capsule (100 mg total) by mouth 3 (three) times daily as needed for cough.     multivitamin-prenatal 27-0.8 MG Tabs tablet  Take 1 tablet by mouth daily at 12 noon.     promethazine 25 MG tablet  Commonly known as:  PHENERGAN  Take 1 tablet (25 mg total) by mouth every 6 (six) hours as needed for nausea or vomiting.           Follow-up Information    Follow up On 09/07/2014.   Why:  for scheduled cesarean section with bilateral tubal ligation at 9AM, please arrive before 7:30AM      Signed: Kamylah Manzo [name] 08/31/2014, 12:33 PM

## 2014-08-31 NOTE — Discharge Instructions (Signed)
Desprendimiento de la placenta (Placental Abruption) La placenta es el rgano que satisface las necesidades de nutricin del beb no nacido (feto). El beb recibe el suministro de sangre y los nutrientes a travs de la placenta. y es su sistema de soporte vital. Se encuentra adherida a la parte interna del tero hasta despus del nacimiento del beb.  El desprendimiento de la placenta se produce cuando esta se separa parcial o completamente del tero antes de que el beb nazca. Esto no sucede con frecuencia, pero puede ocurrir en cualquier momento despus de las 20semanas de gestacin. Es posible que una pequea separacin no cause problemas, pero una de gran magnitud puede ser peligrosa para usted y para el beb. [place] veces, se desconoce la causa del desprendimiento de la placenta. Aunque poco frecuente, el desprendimiento de la placenta puede producirse por las siguientes causas:   Una lesin abdominal.  El cambio de posicin del beb de las nalgas primero (presentacin de nalgas) o la posicin lateral (transversal) a la cabeza primero (ceflica).  Parir el primero de varios bebs (gemelos, trillizos o ms).  La prdida repentina del lquido amnitico (ruptura prematura de las [place]).  El cordn umbilical anormalmente corto. FACTORES DE RIESGO Algunos factores de riesgo hacen ms probable el desprendimiento de la placenta, por ejemplo:  Tener antecedentes de desprendimiento de la placenta.  Tener la presin arterial elevada (hipertensin).  Fumar.  Consumir alcohol.  Tener problemas de coagulacin.  Tener exceso de lquido amnitico.  Risk manager tenido embarazos mltiples (gemelos, trillizos o ms).  Tener convulsiones.  Tener diabetes mellitus.  Haber tenido ms de cuatro hijos.  Tener 35 aos o ms.  Consumir drogas.  Sufrir lesiones abdominales. SIGNOS Y SNTOMAS  Es posible que un desprendimiento pequeo de la placenta no provoque sntomas. Si  tiene sntomas, estos pueden incluir:  Dolor abdominal leve.  Sangrado vaginal leve. Los sntomas del desprendimiento grave de la placenta dependen de la magnitud de la separacin y de la etapa del Media planner. Los sntomas pueden incluir:   Dolor de tero repentino.  Dolor abdominal.  Hemorragia vaginal.  tero dolorido con la palpacin.  Dolor abdominal intenso con la palpacin.  Contracciones uterinas continuas.  Dolor de espalda.  Debilidad, mareos. DIAGNSTICO  Se sospecha que hay desprendimiento de la placenta cuando una embarazada tiene dolor uterino repentino. El mdico controlar si hay mucho dolor de tero con la palpacin, si este est duro y [place], y si la frecuencia o el ritmo cardaco del beb son anormales. Se realizar una ecografa (llamada comnmente ultrasonido). Tambin se harn anlisis de sangre para asegurarse de que haya suficientes glbulos rojos sanos y que no haya problemas de coagulacin sangunea ni signos de [place] gran prdida de [place]. TRATAMIENTO  Generalmente, el desprendimiento de la placenta es una emergencia y requiere tratamiento de inmediato. El tratamiento depender de lo siguiente:   La cantidad de sangrado.  Si usted o el beb estn sufriendo.  La etapa del embarazo.  La madurez del beb. El tratamiento para la separacin parcial de la placenta es el reposo en cama y la observacin minuciosa. Adems, es posible que deba recibir una transfusin de [place] o lquidos a travs de una va intravenosa. El tratamiento para la separacin completa de la placenta es el [place]. Tal vez le hagan una cesrea si hay sufrimiento fetal. [place] los medicamentos solamente como se lo haya indicado el mdico.  Pida ayuda en casa  antes y despus del parto, especialmente si le hicieron una cesrea o perdi [employer].  Descanse y duerma lo suficiente.  No tenga relaciones sexuales hasta que el mdico la  autorice.  No use tampones ni se haga lavados vaginales hasta que el mdico la autorice. SOLICITE ATENCIN MDICA SI:  Tiene un ligero sangrado o manchado vaginal.  Sufre cualquier tipo de traumatismo, por ejemplo, una cada o una sacudida durante un accidente.  Tiene dificultades para no consumir drogas o alcohol, o para no fumar. SOLICITE ATENCIN MDICA DE INMEDIATO SI:  Tiene una hemorragia vaginal abundante.  Siente dolor abdominal.  Siente contracciones continuas en el tero.  Tiene el tero duro y dolorido con Secretary/administrator.  No siente los movimientos del beb, o el beb se mueve muy poco. ASEGRESE DE QUE:  Comprende estas instrucciones.  Controlar su afeccin.  Recibir ayuda de inmediato si no mejora o si empeora. Document Released: 02/20/2008 Document Revised: 09/08/2013 [facility] Patient Information 2015 [place]. This information is not intended to replace advice given to you by your health care provider. Make sure you discuss any questions you have with your health care provider.

## 2014-08-31 NOTE — Progress Notes (Signed)
UR completed 

## 2014-08-31 NOTE — Progress Notes (Signed)
Patient was assessed and questions was answered using an interpreter.

## 2014-08-31 NOTE — Progress Notes (Signed)
Patient was given discharge instruction using the in house interpreter.  She voices understanding of when to return to MAU, dial 911 and community resource  Numbers.  She was advised of upcoming appointments.

## 2014-09-01 ENCOUNTER — Other Ambulatory Visit (HOSPITAL_COMMUNITY): Payer: Self-pay

## 2014-09-01 NOTE — Discharge Summary (Signed)
Obstetric Discharge Lorraine [name] admission 11/20 to 08/08/2014 Reason for Admission: observation/evaluation for vaginal bleeding at 32w4 day Lorraine [name] is a [id number] 28 y.o. female with hx of 2 Caesarian sections presenting for vaginal bleeding with EGA of [redacted]w[redacted]d. Patient was recently admitted on 11/16 to antenatal unit at [facility] and discharged on 11/19 for vaginal bleeding.During the course of her hospital stay, patient had an initial positive amnisure and u/s was normal. She was admitted and monitored for possible PPROM. Patient received 2 doses of betamethasone. Repeat Amnisure on 11/17 and 11/19 were negatve. FHT were reassuring. Patient was given nicardipine and hydromorphone, however patient did not fill the prescription.   Patient states the vaginal bleeding started this morning and was similar to her normal periods but now spotting. She states the blood was bright red and dark in nature. She states she is having irregular, strong contractions that are about 7-8 min apart.  Examination at admission could NOT identify any bleeding ,Blood pressure 122/71, pulse 91, temperature 98.2 F (36.8 C), resp. rate 18, height 5' (1.524 m), weight 90.266 kg (199 lb), last menstrual period 11/27/2013. Maternal Exam:  Abdomen: Patient reports generalized tenderness.  Introitus: Normal vulva. Vagina is positive for vaginal discharge.  Ferning test: not done.  Nitrazine test: not done. Amniotic fluid character: not assessed. No vaginal bleeding  Cervix: Cervix evaluated by sterile speculum exam and digital exam.  She also complained of RUQ pain, chest pressure at end of sternum, and tingling and numbness of hands and feet.  Prenatal Procedures: ultrasound, periodic fetal monitoring She had continued complaints of discomfort and intermittent irregular contractions every 10-20 minutes, with category I FHR. amniosure was repeated and negative.   Repeat vaginal exams showed no blood per vagina, and pain  complaints migrated to the LUQ.  U/s was negative for abruption or clots. HEMOGLOBIN  Date Value Ref Range Status  08/23/2014 11.1* 12.0 - 15.0 g/dL Final   HCT  Date Value Ref Range Status  08/23/2014 33.3* 36.0 - 46.0 % Final   Discharge exam: Subjective: PT Slept thru the night after percocet at [place], but again complains of discomfort , will again monitor: The pain is primarily in the LUQ, constant, steady, not improved by position changes, or by walking, or by food consumption. I will try tx with GI cocktail this a.m. Patient reports the fetal movement as active. Patient reports uterine contraction activity as none. Patient reports vaginal bleeding as none. Patient describes fluid per vagina as None. AMNISURE YESTERDAY NEGATIVE U/s : NEGATIVE FOR BLEEDING OR ABRUPTION. Vitals: Blood pressure 120/61, pulse 110, temperature 98.3 F (36.8 C), temperature source Oral, resp. rate 18, height 5' (1.524 m), weight 90.266 kg (199 lb), last menstrual period 11/27/2013. Physical Examination: General appearance - oriented to person, place, and time, overweight, ill-appearing and GRIMMACING, due to discomfort in LUQ primarily, some bilateral LQ pain consistent with round ligament and pelvic support pain Heart - normal rate and regular rhythm Abdomen - soft, nontender, nondistended Fundal Height: size equals dates Cervical Exam: Not evaluated. and yesterday was found to be closed and fetal presentation is cephalic. Extremities: extremities normal, atraumatic, no cyanosis or edema and Homans sign is negative, no sign of DVT with DTRs 2+ bilaterally Membranes:intact No bleeding noted by nursing in 24 hours.  Discharge Diagnoses:  Pregnancy 32 w6 days  Recurrent abdominal pain, variable location                                         Third trimester bleeding, antepartum                                          Prior cesarean x 2 complicating  pregnancy                                          History of preterm delivery                                          H/o preterm delivery  Discharge Information:followup  [facility] this week  Activity: pelvic rest Diet: routine Medications: procardia, percocet,prenatal vits Condition: stable Instructions: refer to practice specific booklet and return for any recurrent bleeding, followup [place] this week Discharge to: home Follow-up Information    Follow up with [facility]. Schedule an appointment as soon as possible for a visit in 1 week.   Specialty:  Obstetrics and Gynecology   Why:  If symptoms worsen, As needed   Contact information:   [address] [id number] [place] V 09/01/2014, 7:50 AM -11/22

## 2014-09-03 ENCOUNTER — Inpatient Hospital Stay (HOSPITAL_COMMUNITY): Payer: Medicaid Other | Admitting: Anesthesiology

## 2014-09-03 ENCOUNTER — Inpatient Hospital Stay (HOSPITAL_COMMUNITY)
Admission: AD | Admit: 2014-09-03 | Discharge: 2014-09-06 | DRG: 765 | Disposition: A | Discharge status: Home / Self Care | Payer: Medicaid Other | Source: Ambulatory Visit | Attending: Obstetrics & Gynecology | Admitting: Obstetrics & Gynecology

## 2014-09-03 ENCOUNTER — Encounter (HOSPITAL_COMMUNITY): Payer: Self-pay | Admitting: *Deleted

## 2014-09-03 ENCOUNTER — Encounter (HOSPITAL_COMMUNITY)
Admission: AD | Disposition: A | Discharge status: Home / Self Care | Payer: Self-pay | Source: Ambulatory Visit | Attending: Obstetrics & Gynecology

## 2014-09-03 DIAGNOSIS — Z302 Encounter for sterilization: Secondary | ICD-10-CM

## 2014-09-03 DIAGNOSIS — O42919 Preterm premature rupture of membranes, unspecified as to length of time between rupture and onset of labor, unspecified trimester: Secondary | ICD-10-CM

## 2014-09-03 DIAGNOSIS — Z3A36 36 weeks gestation of pregnancy: Secondary | ICD-10-CM | POA: Diagnosis present

## 2014-09-03 DIAGNOSIS — O42913 Preterm premature rupture of membranes, unspecified as to length of time between rupture and onset of labor, third trimester: Secondary | ICD-10-CM | POA: Diagnosis present

## 2014-09-03 DIAGNOSIS — O4593 Premature separation of placenta, unspecified, third trimester: Principal | ICD-10-CM | POA: Diagnosis present

## 2014-09-03 DIAGNOSIS — O4693 Antepartum hemorrhage, unspecified, third trimester: Secondary | ICD-10-CM | POA: Diagnosis present

## 2014-09-03 DIAGNOSIS — O34219 Maternal care for unspecified type scar from previous cesarean delivery: Secondary | ICD-10-CM

## 2014-09-03 DIAGNOSIS — R52 Pain, unspecified: Secondary | ICD-10-CM

## 2014-09-03 DIAGNOSIS — Z833 Family history of diabetes mellitus: Secondary | ICD-10-CM | POA: Diagnosis not present

## 2014-09-03 DIAGNOSIS — O3421 Maternal care for scar from previous cesarean delivery: Secondary | ICD-10-CM | POA: Diagnosis present

## 2014-09-03 DIAGNOSIS — Z98891 History of uterine scar from previous surgery: Secondary | ICD-10-CM

## 2014-09-03 DIAGNOSIS — N939 Abnormal uterine and vaginal bleeding, unspecified: Secondary | ICD-10-CM

## 2014-09-03 LAB — TYPE AND SCREEN
ABO/RH(D): O POS
ANTIBODY SCREEN: NEGATIVE

## 2014-09-03 LAB — CBC
HCT: 33 % — ABNORMAL LOW (ref 36.0–46.0)
HEMOGLOBIN: 11.2 g/dL — AB (ref 12.0–15.0)
MCH: 30.7 pg (ref 26.0–34.0)
MCHC: 33.9 g/dL (ref 30.0–36.0)
MCV: 90.4 fL (ref 78.0–100.0)
Platelets: 241 10*3/uL (ref 150–400)
RBC: 3.65 MIL/uL — ABNORMAL LOW (ref 3.87–5.11)
RDW: 13.6 % (ref 11.5–15.5)
WBC: 9.9 10*3/uL (ref 4.0–10.5)

## 2014-09-03 LAB — AMNISURE RUPTURE OF MEMBRANE (ROM) NOT AT ARMC: AMNISURE: POSITIVE

## 2014-09-03 LAB — SURGICAL PCR SCREEN
MRSA, PCR: NEGATIVE
Staphylococcus aureus: NEGATIVE

## 2014-09-03 SURGERY — Surgical Case
Anesthesia: Epidural

## 2014-09-03 MED ORDER — NALBUPHINE HCL 10 MG/ML IJ SOLN
5.0000 mg | Freq: Once | INTRAMUSCULAR | Status: AC | PRN
Start: 2014-09-03 — End: 2014-09-03

## 2014-09-03 MED ORDER — TETANUS-DIPHTH-ACELL PERTUSSIS 5-2.5-18.5 LF-MCG/0.5 IM SUSP: 0.5000 mL | Freq: Once | INTRAMUSCULAR | Status: DC

## 2014-09-03 MED ORDER — DOCUSATE SODIUM 100 MG PO CAPS
100.0000 mg | ORAL_CAPSULE | Freq: Every day | ORAL | Status: DC
  Filled 2014-09-03: qty 1

## 2014-09-03 MED ORDER — CITRIC ACID-SODIUM CITRATE 334-500 MG/5ML PO SOLN
ORAL | Status: AC
  Administered 2014-09-03: 30 mL
  Filled 2014-09-03: qty 15

## 2014-09-03 MED ORDER — PHENYLEPHRINE 8 MG IN D5W 100 ML (0.08MG/ML) PREMIX OPTIME
INJECTION | INTRAVENOUS | Status: DC | PRN
  Administered 2014-09-03: 60 ug/min via INTRAVENOUS

## 2014-09-03 MED ORDER — PHENYLEPHRINE 8 MG IN D5W 100 ML (0.08MG/ML) PREMIX OPTIME
INJECTION | INTRAVENOUS | Status: AC
  Filled 2014-09-03: qty 100

## 2014-09-03 MED ORDER — SCOPOLAMINE 1 MG/3DAYS TD PT72
1.0000 | MEDICATED_PATCH | Freq: Once | TRANSDERMAL | Status: DC
  Administered 2014-09-03: 1.5 mg via TRANSDERMAL

## 2014-09-03 MED ORDER — LANOLIN HYDROUS EX OINT: 1.0000 "application " | TOPICAL_OINTMENT | CUTANEOUS | Status: DC | PRN

## 2014-09-03 MED ORDER — KETOROLAC TROMETHAMINE 30 MG/ML IJ SOLN: 30.0000 mg | Freq: Four times a day (QID) | INTRAMUSCULAR | Status: DC | PRN

## 2014-09-03 MED ORDER — CEFAZOLIN SODIUM-DEXTROSE 2-3 GM-% IV SOLR
2.0000 g | Freq: Once | INTRAVENOUS | Status: AC
  Administered 2014-09-03: 2 g via INTRAVENOUS
  Filled 2014-09-03: qty 50

## 2014-09-03 MED ORDER — DIPHENHYDRAMINE HCL 25 MG PO CAPS
25.0000 mg | ORAL_CAPSULE | ORAL | Status: DC | PRN
  Filled 2014-09-03: qty 1

## 2014-09-03 MED ORDER — ONDANSETRON HCL 4 MG/2ML IJ SOLN: 4.0000 mg | INTRAMUSCULAR | Status: DC | PRN

## 2014-09-03 MED ORDER — ONDANSETRON HCL 4 MG/2ML IJ SOLN
INTRAMUSCULAR | Status: DC | PRN
  Administered 2014-09-03: 4 mg via INTRAVENOUS

## 2014-09-03 MED ORDER — FENTANYL CITRATE 0.05 MG/ML IJ SOLN
INTRAMUSCULAR | Status: AC
  Filled 2014-09-03: qty 2

## 2014-09-03 MED ORDER — DIPHENHYDRAMINE HCL 50 MG/ML IJ SOLN: 12.5000 mg | INTRAMUSCULAR | Status: DC | PRN

## 2014-09-03 MED ORDER — NALBUPHINE HCL 10 MG/ML IJ SOLN: 5.0000 mg | INTRAMUSCULAR | Status: DC | PRN

## 2014-09-03 MED ORDER — MEASLES, MUMPS & RUBELLA VAC ~~LOC~~ INJ: 0.5000 mL | INJECTION | Freq: Once | SUBCUTANEOUS | Status: DC

## 2014-09-03 MED ORDER — SIMETHICONE 80 MG PO CHEW
80.0000 mg | CHEWABLE_TABLET | ORAL | Status: DC
  Administered 2014-09-03 – 2014-09-05 (×3): 80 mg via ORAL
  Filled 2014-09-03 (×3): qty 1

## 2014-09-03 MED ORDER — LACTATED RINGERS IV SOLN
INTRAVENOUS | Status: DC
  Administered 2014-09-03 (×4): via INTRAVENOUS

## 2014-09-03 MED ORDER — SCOPOLAMINE 1 MG/3DAYS TD PT72
MEDICATED_PATCH | TRANSDERMAL | Status: AC
  Filled 2014-09-03: qty 1

## 2014-09-03 MED ORDER — PRENATAL MULTIVITAMIN CH
1.0000 | ORAL_TABLET | Freq: Every day | ORAL | Status: DC
  Administered 2014-09-04 – 2014-09-05 (×2): 1 via ORAL
  Filled 2014-09-03 (×2): qty 1

## 2014-09-03 MED ORDER — SIMETHICONE 80 MG PO CHEW: 80.0000 mg | CHEWABLE_TABLET | ORAL | Status: DC | PRN

## 2014-09-03 MED ORDER — DIBUCAINE 1 % RE OINT: 1.0000 "application " | TOPICAL_OINTMENT | RECTAL | Status: DC | PRN

## 2014-09-03 MED ORDER — MORPHINE SULFATE (PF) 0.5 MG/ML IJ SOLN
INTRAMUSCULAR | Status: DC | PRN
  Administered 2014-09-03: .1 mg via INTRATHECAL

## 2014-09-03 MED ORDER — LACTATED RINGERS IV SOLN
INTRAVENOUS | Status: DC
  Administered 2014-09-03 – 2014-09-04 (×2): via INTRAVENOUS

## 2014-09-03 MED ORDER — FENTANYL CITRATE 0.05 MG/ML IJ SOLN
50.0000 ug | INTRAMUSCULAR | Status: DC | PRN
Start: 2014-09-03 — End: 2014-09-06
  Administered 2014-09-03: 50 ug via INTRAVENOUS
  Filled 2014-09-03: qty 2

## 2014-09-03 MED ORDER — FENTANYL CITRATE 0.05 MG/ML IJ SOLN
INTRAMUSCULAR | Status: DC | PRN
  Administered 2014-09-03: 25 ug via INTRATHECAL

## 2014-09-03 MED ORDER — BUPIVACAINE IN DEXTROSE 0.75-8.25 % IT SOLN
INTRATHECAL | Status: AC
  Filled 2014-09-03: qty 2

## 2014-09-03 MED ORDER — OXYTOCIN 10 UNIT/ML IJ SOLN
40.0000 [IU] | INTRAVENOUS | Status: DC | PRN
  Administered 2014-09-03: 40 [IU] via INTRAVENOUS

## 2014-09-03 MED ORDER — KETOROLAC TROMETHAMINE 30 MG/ML IJ SOLN
30.0000 mg | Freq: Once | INTRAMUSCULAR | Status: AC
  Administered 2014-09-03: 30 mg via INTRAMUSCULAR

## 2014-09-03 MED ORDER — IBUPROFEN 600 MG PO TABS
600.0000 mg | ORAL_TABLET | Freq: Four times a day (QID) | ORAL | Status: DC
Start: 2014-09-04 — End: 2014-09-06
  Administered 2014-09-03 – 2014-09-06 (×10): 600 mg via ORAL
  Filled 2014-09-03 (×10): qty 1

## 2014-09-03 MED ORDER — MEPERIDINE HCL 25 MG/ML IJ SOLN: 6.2500 mg | INTRAMUSCULAR | Status: DC | PRN

## 2014-09-03 MED ORDER — OXYTOCIN 40 UNITS IN LACTATED RINGERS INFUSION - SIMPLE MED: 62.5000 mL/h | INTRAVENOUS | Status: AC

## 2014-09-03 MED ORDER — ACETAMINOPHEN 325 MG PO TABS: 650.0000 mg | ORAL_TABLET | ORAL | Status: DC | PRN

## 2014-09-03 MED ORDER — CITRIC ACID-SODIUM CITRATE 334-500 MG/5ML PO SOLN: 30.0000 mL | Freq: Once | ORAL | Status: DC

## 2014-09-03 MED ORDER — OXYCODONE-ACETAMINOPHEN 5-325 MG PO TABS
2.0000 | ORAL_TABLET | ORAL | Status: DC | PRN
  Filled 2014-09-03: qty 2

## 2014-09-03 MED ORDER — CALCIUM CARBONATE ANTACID 500 MG PO CHEW: 2.0000 | CHEWABLE_TABLET | ORAL | Status: DC | PRN

## 2014-09-03 MED ORDER — DIPHENHYDRAMINE HCL 25 MG PO CAPS: 25.0000 mg | ORAL_CAPSULE | Freq: Four times a day (QID) | ORAL | Status: DC | PRN

## 2014-09-03 MED ORDER — MENTHOL 3 MG MT LOZG
1.0000 | LOZENGE | OROMUCOSAL | Status: DC | PRN
  Administered 2014-09-05: 3 mg via ORAL
  Filled 2014-09-03: qty 9

## 2014-09-03 MED ORDER — ONDANSETRON HCL 4 MG/2ML IJ SOLN
INTRAMUSCULAR | Status: AC
  Filled 2014-09-03: qty 2

## 2014-09-03 MED ORDER — NALOXONE HCL 1 MG/ML IJ SOLN: 1.0000 ug/kg/h | INTRAVENOUS | Status: DC | PRN

## 2014-09-03 MED ORDER — SODIUM CHLORIDE 0.9 % IJ SOLN: 3.0000 mL | INTRAMUSCULAR | Status: DC | PRN

## 2014-09-03 MED ORDER — ZOLPIDEM TARTRATE 5 MG PO TABS: 5.0000 mg | ORAL_TABLET | Freq: Every evening | ORAL | Status: DC | PRN

## 2014-09-03 MED ORDER — OXYCODONE-ACETAMINOPHEN 5-325 MG PO TABS
1.0000 | ORAL_TABLET | ORAL | Status: DC | PRN
  Administered 2014-09-04 – 2014-09-06 (×7): 1 via ORAL
  Filled 2014-09-03 (×6): qty 1

## 2014-09-03 MED ORDER — SENNOSIDES-DOCUSATE SODIUM 8.6-50 MG PO TABS
2.0000 | ORAL_TABLET | ORAL | Status: DC
  Administered 2014-09-03 – 2014-09-05 (×3): 2 via ORAL
  Filled 2014-09-03 (×3): qty 2

## 2014-09-03 MED ORDER — NALOXONE HCL 0.4 MG/ML IJ SOLN: 0.4000 mg | INTRAMUSCULAR | Status: DC | PRN

## 2014-09-03 MED ORDER — PRENATAL MULTIVITAMIN CH: 1.0000 | ORAL_TABLET | Freq: Every day | ORAL | Status: DC

## 2014-09-03 MED ORDER — OXYTOCIN 10 UNIT/ML IJ SOLN
INTRAMUSCULAR | Status: AC
  Filled 2014-09-03: qty 4

## 2014-09-03 MED ORDER — KETOROLAC TROMETHAMINE 30 MG/ML IJ SOLN
INTRAMUSCULAR | Status: AC
  Filled 2014-09-03: qty 1

## 2014-09-03 MED ORDER — ONDANSETRON HCL 4 MG PO TABS: 4.0000 mg | ORAL_TABLET | ORAL | Status: DC | PRN

## 2014-09-03 MED ORDER — CEFAZOLIN SODIUM-DEXTROSE 2-3 GM-% IV SOLR
INTRAVENOUS | Status: AC
  Filled 2014-09-03: qty 50

## 2014-09-03 MED ORDER — FENTANYL CITRATE 0.05 MG/ML IJ SOLN: 25.0000 ug | INTRAMUSCULAR | Status: DC | PRN

## 2014-09-03 MED ORDER — MORPHINE SULFATE 0.5 MG/ML IJ SOLN
INTRAMUSCULAR | Status: AC
  Filled 2014-09-03: qty 10

## 2014-09-03 MED ORDER — NALBUPHINE HCL 10 MG/ML IJ SOLN: 5.0000 mg | Freq: Once | INTRAMUSCULAR | Status: AC | PRN

## 2014-09-03 MED ORDER — WITCH HAZEL-GLYCERIN EX PADS: 1.0000 "application " | MEDICATED_PAD | CUTANEOUS | Status: DC | PRN

## 2014-09-03 MED ORDER — ONDANSETRON HCL 4 MG/2ML IJ SOLN: 4.0000 mg | Freq: Three times a day (TID) | INTRAMUSCULAR | Status: DC | PRN

## 2014-09-03 SURGICAL SUPPLY — 34 items
BRR ADH 6X5 SEPRAFILM 1 SHT (MISCELLANEOUS) ×1
CLAMP CORD UMBIL (MISCELLANEOUS) IMPLANT
CLIP FILSHIE TUBAL LIGA STRL (Clip) ×2 IMPLANT
CLOTH BEACON ORANGE TIMEOUT ST (SAFETY) ×3 IMPLANT
DRAIN JACKSON PRT FLT 7MM (DRAIN) IMPLANT
DRAPE SHEET LG 3/4 BI-LAMINATE (DRAPES) IMPLANT
DRSG OPSITE POSTOP 4X10 (GAUZE/BANDAGES/DRESSINGS) ×3 IMPLANT
DURAPREP 26ML APPLICATOR (WOUND CARE) ×3 IMPLANT
ELECT REM PT RETURN 9FT ADLT (ELECTROSURGICAL) ×3
ELECTRODE REM PT RTRN 9FT ADLT (ELECTROSURGICAL) ×1 IMPLANT
EVACUATOR SILICONE 100CC (DRAIN) IMPLANT
EXTRACTOR VACUUM M CUP 4 TUBE (SUCTIONS) IMPLANT
EXTRACTOR VACUUM M CUP 4' TUBE (SUCTIONS)
GLOVE BIO SURGEON STRL SZ7 (GLOVE) ×3 IMPLANT
GLOVE BIOGEL PI IND STRL 7.0 (GLOVE) ×1 IMPLANT
GLOVE BIOGEL PI INDICATOR 7.0 (GLOVE) ×2
GOWN STRL REUS W/TWL LRG LVL3 (GOWN DISPOSABLE) ×6 IMPLANT
KIT ABG SYR 3ML LUER SLIP (SYRINGE) IMPLANT
NDL HYPO 25X5/8 SAFETYGLIDE (NEEDLE) ×1 IMPLANT
NEEDLE HYPO 25X5/8 SAFETYGLIDE (NEEDLE) ×3 IMPLANT
NS IRRIG 1000ML POUR BTL (IV SOLUTION) ×3 IMPLANT
PACK C SECTION WH (CUSTOM PROCEDURE TRAY) ×3 IMPLANT
PAD ABD 7.5X8 STRL (GAUZE/BANDAGES/DRESSINGS) ×2 IMPLANT
PAD OB MATERNITY 4.3X12.25 (PERSONAL CARE ITEMS) ×3 IMPLANT
RTRCTR C-SECT PINK 25CM LRG (MISCELLANEOUS) ×3 IMPLANT
SEPRAFILM MEMBRANE 5X6 (MISCELLANEOUS) ×2 IMPLANT
SPONGE DRAIN TRACH 4X4 STRL 2S (GAUZE/BANDAGES/DRESSINGS) ×4 IMPLANT
SUT PLAIN 2 0 XLH (SUTURE) ×2 IMPLANT
SUT VIC AB 0 CTX 36 (SUTURE) ×15
SUT VIC AB 0 CTX36XBRD ANBCTRL (SUTURE) ×5 IMPLANT
SUT VIC AB 4-0 KS 27 (SUTURE) ×3 IMPLANT
TOWEL OR 17X24 6PK STRL BLUE (TOWEL DISPOSABLE) ×3 IMPLANT
TRAY FOLEY CATH 14FR (SET/KITS/TRAYS/PACK) ×3 IMPLANT
WATER STERILE IRR 1000ML POUR (IV SOLUTION) ×3 IMPLANT

## 2014-09-03 NOTE — Progress Notes (Signed)
Pt to OR via stretcher.

## 2014-09-03 NOTE — MAU Note (Addendum)
Pt reports she had onset of vaginal bleeding about 20 minutes ago. Onset of pain after bleeding started.

## 2014-09-03 NOTE — Progress Notes (Signed)
Amnisure done & to be sent to lab

## 2014-09-03 NOTE — Anesthesia Preprocedure Evaluation (Signed)
Anesthesia Evaluation  Patient identified by MRN, date of birth, ID band Patient awake    Reviewed: Allergy & Precautions, H&P , Patient's Chart, lab work & pertinent test results  Airway Mallampati: II  TM Distance: >3 FB Neck ROM: full    Dental no notable dental hx.    Pulmonary asthma (No recent increae in inhaler use) ,  breath sounds clear to auscultation  Pulmonary exam normal       Cardiovascular Exercise Tolerance: Good Rhythm:regular Rate:Normal     Neuro/Psych    GI/Hepatic   Endo/Other  Morbid obesity  Renal/GU      Musculoskeletal   Abdominal   Peds  Hematology   Anesthesia Other Findings   Reproductive/Obstetrics                             Anesthesia Physical Anesthesia Plan  ASA: III  Anesthesia Plan: Spinal, Epidural and Combined Spinal and Epidural   Post-op Pain Management:    Induction:   Airway Management Planned:   Additional Equipment:   Intra-op Plan:   Post-operative Plan:   Informed Consent: I have reviewed the patients History and Physical, chart, labs and discussed the procedure including the risks, benefits and alternatives for the proposed anesthesia with the patient or authorized representative who has indicated his/her understanding and acceptance.   Dental Advisory Given  Plan Discussed with: CRNA  Anesthesia Plan Comments: (Lab work confirmed with CRNA in room. Platelets okay. Discussed spinal anesthetic, and patient consents to the procedure:  included risk of possible headache,backache, failed block, allergic reaction, and nerve injury. This patient was asked if she had any questions or concerns before the procedure started. )        Anesthesia Quick Evaluation

## 2014-09-03 NOTE — Lactation Note (Signed)
This note was copied from the chart of Lorraine [name]. Lactation Consultation Note Initial visit with Spanish interpreter per mom's request.  Mom has 3 older children that she did not breastfeed, but wants to breast feed this baby.  Baby is STS on mom's chest post bath with audible grunting sounds and pink skin tone.  Reported to [place] my concerns about grunting.  Discussed with mom baby may not be ready to breastfeed right now, but to attempt with feeding cues.  Baby breast fed about 2 hours ago for 20 minutes with latch score of "7". [facility] resources given and discussed.  Encouraged to feed with early cues on demand.  Early newborn behavior discussed.  Hand expression demonstrated no colostrum visible at this time.  Mom to call for assist as needed.    Patient Name: Lorraine Jackeline Gutknecht YOVZC'H Date: 09/03/2014 Reason for consult: Initial assessment   Maternal Data Has patient been taught Hand Expression?: Yes Does the patient have breastfeeding experience prior to this delivery?: No  Feeding Feeding Type: Breast Fed Length of feed: 20 min  LATCH Score/Interventions Latch: Repeated attempts needed to sustain latch, nipple held in mouth throughout feeding, stimulation needed to elicit sucking reflex. Intervention(s): Adjust position;Assist with latch;Breast massage  Audible Swallowing: A few with stimulation Intervention(s): Hand expression Intervention(s): Hand expression;Alternate breast massage  Type of Nipple: Everted at rest and after stimulation  Comfort (Breast/Nipple): Soft / non-tender     Hold (Positioning): Assistance needed to correctly position infant at breast and maintain latch. Intervention(s): Support Pillows;Position options;Skin to skin  LATCH Score: 7  Lactation Tools Discussed/Used     Consult Status Consult Status: Follow-up Date: 09/04/14 Follow-up type: In-patient    [place], [place] 09/03/2014, 11:06 PM

## 2014-09-03 NOTE — Anesthesia Postprocedure Evaluation (Signed)
  Anesthesia Post-op Note  Patient: Lorraine [name]  Procedure(s) Performed: Procedure(s): CESAREAN SECTION (N/A)  Patient Location: Mother/Baby  Anesthesia Type:Spinal and Epidural  Level of Consciousness: awake  Airway and Oxygen Therapy: Patient Spontanous Breathing  Post-op Pain: mild  Post-op Assessment: Patient's Cardiovascular Status Stable and Respiratory Function Stable  Post-op Vital Signs: stable  Last Vitals:  Filed Vitals:   09/03/14 1815  BP: 118/56  Pulse: 74  Temp: 36.7 C  Resp: 18    Complications: No apparent anesthesia complications

## 2014-09-03 NOTE — Transfer of Care (Signed)
Immediate Anesthesia Transfer of Care Note  Patient: Lorraine [name]  Procedure(s) Performed: Procedure(s): CESAREAN SECTION (N/A)  Patient Location: PACU  Anesthesia Type:Spinal  Level of Consciousness: awake, alert  and oriented  Airway & Oxygen Therapy: Patient Spontanous Breathing  Post-op Assessment: Report given to PACU RN and Post -op Vital signs reviewed and stable  Post vital signs: Reviewed and stable  Complications: No apparent anesthesia complications

## 2014-09-03 NOTE — H&P (Signed)
Lorraine [name] is a 28 y.o. [id number] at [redacted]w[redacted]d admitted for vaginal bleeding.  She has had several admissions for presumed chronic abruption, and was, in fact, dc'd home 3 days ago.  She had been over a week w/o bleeding until she got up tonight to use the bathroom and noticed "a lot" of bright red VB with "clots" in the toilet.  Having some mild ctx.Marland Kitchen   0 Fetal presentation is cephalic.   Patient reports the fetal movement as active. Patient reports uterine contraction  activity as irregular, every 5-10 minutes. Patient reports  vaginal bleeding as "a lot with clots". Patient describes fluid per vagina as None.  Patient Active Problem List   Diagnosis Date Noted  . [redacted] weeks gestation of pregnancy   . Placental abruption in third trimester   . Vaginal bleeding in pregnancy 08/25/2014  . Vaginal bleeding during pregnancy, antepartum   . Obesity affecting pregnancy in third trimester, antepartum   . Pyelonephritis 07/12/2014  . Previous cesarean section complicating pregnancy 75/64/3329  . Supervision of other normal pregnancy, antepartum 03/02/2014  . H/O preterm delivery, currently pregnant 03/02/2014  . Diverticulitis 09/10/2012   Past Medical History: Past Medical History  Diagnosis Date  . Diverticulitis   . Pyelonephritis     Past Surgical History: Past Surgical History  Procedure Laterality Date  . Cholecystectomy    . Cesarean section    . Cesarean section N/A 2006    Obstetrical History: OB History    Gravida Para Term Preterm AB TAB SAB Ectopic Multiple Living   5 2 1 1 2  2   2         Social History: History   Social History  . Marital Status: Married    Spouse Name: N/A    Number of Children: N/A  . Years of Education: N/A   Social History Main Topics  . Smoking status: Never Smoker   . Smokeless tobacco: Never Used  . Alcohol Use: No  . Drug Use: No  . Sexual Activity: Yes    Birth Control/ Protection: None   Other Topics Concern  .  None   Social History Narrative   ** Merged History Encounter **        Family History: Family History  Problem Relation Age of Onset  . Hyperlipidemia Mother   . Diabetes Maternal Uncle   . Diabetes Paternal Grandmother     Allergies: No Known Allergies  Prescriptions prior to admission  Medication Sig Dispense Refill Last Dose  . albuterol (PROVENTIL HFA;VENTOLIN HFA) 108 (90 BASE) MCG/ACT inhaler Inhale 4 puffs into the lungs every 6 (six) hours as needed for wheezing or shortness of breath. 1 Inhaler 2 Past Week at Unknown time  . benzonatate (TESSALON) 100 MG capsule Take 1 capsule (100 mg total) by mouth 3 (three) times daily as needed for cough. 20 capsule 0 09/02/2014 at Unknown time  . Prenatal Vit-Fe Fumarate-FA (MULTIVITAMIN-PRENATAL) 27-0.8 MG TABS tablet Take 1 tablet by mouth daily at 12 noon.   09/02/2014 at Unknown time  . promethazine (PHENERGAN) 25 MG tablet Take 1 tablet (25 mg total) by mouth every 6 (six) hours as needed for nausea or vomiting. 30 tablet 0 Past Week at Unknown time   Review of Systems   Constitutional: Negative for fever and chills Eyes: Negative for visual disturbances Respiratory: Negative for shortness of breath, dyspnea Cardiovascular: Negative for chest pain or palpitations  Gastrointestinal: Negative for vomiting, diarrhea and constipation Genitourinary: Negative for dysuria  and urgency Musculoskeletal: Negative for back pain, joint pain, myalgias  Neurological: Negative for dizziness and headaches    Vitals:  Last menstrual period 11/27/2013. Physical Examination:  General appearance - alert, well appearing, and in no distress Mental status - alert, oriented to person, place, and time Abdomen: gravid and non-tender and fundal height  is size equals dates Pelvic Exam:SSE:  Quarter sized clot removed from os; no active bleeding.  Had a 2inchX1inch stain on pad Cervix: Evaluated by digital exam and found to be  closed/short Extremities: no edema with DTRs 2+ Membranes:intact Fetal Monitoring:Baseline: 140 bpm, Variability: Good {> 6 bpm), Accelerations: Reactive and Decelerations: Absent   Labs:  No results found for this or any previous visit (from the past 24 hour(s)).  I have reviewed the patient's current medications.   ASSESSMENT: Chronic abruption, moderate bleeding episode.  PLAN: admit, NPO; if stabilizes, csection at 37 weeks per Dr Elly Modena

## 2014-09-03 NOTE — Progress Notes (Signed)
I assisted Felicia, RN with questions.  [place]  Interpreter.

## 2014-09-03 NOTE — Progress Notes (Signed)
I assisted Dr. Gala Romney with explanation of care plan.  [place] Interpreter.

## 2014-09-03 NOTE — Progress Notes (Signed)
Pt c/o leaking fluid.  RN in room to assess, small amt clear fluid noted on perineum.

## 2014-09-03 NOTE — Progress Notes (Signed)
Eda Royal, interpreter, in pt room prior to pt leaving for surgery and will remain in OR for duration of surgery (until pt goes to PACU)

## 2014-09-03 NOTE — Op Note (Addendum)
Lorraine [name] PROCEDURE DATE: 09/03/2014  PREOPERATIVE DIAGNOSIS: Intrauterine pregnancy at  [redacted]w[redacted]d weeks gestation with PPROM and 2 prior cesarean sections, undesired fertility.  POSTOPERATIVE DIAGNOSIS: The same  PROCEDURE:    Low Transverse Cesarean Section and bilateral tubal ligation with Filshie clips  SURGEON:  Dr. Silas [place]  ASSISTANT: Dr. Parks Ranger  INDICATIONS: Lorraine [name] is a 28 y.o. [id number] at [redacted]w[redacted]d with PPROM.  The risks of cesarean section discussed with the patient included but were not limited to: bleeding which may require transfusion or reoperation; infection which may require antibiotics; injury to bowel, bladder, ureters or other surrounding organs; injury to the fetus; need for additional procedures including hysterectomy in the event of a life-threatening hemorrhage; placental abnormalities wth subsequent pregnancies, incisional problems, thromboembolic phenomenon and other postoperative/anesthesia complications. The patient concurred with the proposed plan, giving informed written consent for the procedure.    FINDINGS:  Viable female  infant in cephalic presentation, 7,00 Apgars, weight to be determined in 1 hour, clear amniotic fluid.  Intact placenta, three vessel cord.  Grossly normal uterus, ovaries and fallopian tubes. .   ANESTHESIA:    Spinal  ESTIMATED BLOOD LOSS: 800cc  SPECIMENS: Placenta sent to pathology  COMPLICATIONS: None immediate  PROCEDURE IN DETAIL:  The patient received intravenous antibiotics and had sequential compression devices applied to her lower extremities.  Spinal anesthesia was administered and was found to be adequate. She was then placed in a dorsal supine position with a leftward tilt, and prepped and draped in a sterile manner.  A foley catheter was placed into her bladder and attached to constant gravity.  After an adequate timeout was performed, a Pfannenstiel skin incision was made with scalpel and carried  through to the underlying layer of fascia. The fascia was incised in the midline and this incision was extended bilaterally using the Mayo scissors. Kocher clamps were applied to the superior aspect of the fascial incision and the underlying rectus muscles were dissected off bluntly. The inferior aspect had dense adhesions and no dissection was done to gain access to the peritoneal cavity. The rectus muscles were separated in the midline bluntly and the peritoneum was entered bluntly.   A transverse hysterotomy was made with a scalpel and extended bilaterally bluntly. The bladder blade was then removed. The infant was successfully delivered, and cord was clamped and cut and infant was handed over to awaiting neonatology team. Uterine massage was then administered and the placenta delivered intact with three-vessel cord. The uterus was cleared of clot and debris.  The hysterotomy was closed with 0 vicryl and the incision was noted to be hemostatic off tension.  Each fallopian tube was identified and followed out to its fimbriated end.  A Filshie clip was placed across each fallopian tube in the proximal 1/3.The peritoneum and rectus muscles were noted to be hemostatic.  The fascia was closed with 0-Vicryl in a running fashion with good restoration of anatomy.  The subcutaneus tissue was copiously irrigated and re approximated with 2-0 plain suture.  The skin was closed with 4-0 Vicryl in a subcuticular fashion.    Pt tolerated the procedure will.  All counts were correct x2.  Pt went to the recovery room in stable condition.

## 2014-09-03 NOTE — Anesthesia Procedure Notes (Signed)
Epidural Patient location during procedure: OB  Preanesthetic Checklist Completed: patient identified, site marked, surgical consent, pre-op evaluation, timeout performed, IV checked, risks and benefits discussed and monitors and equipment checked  Epidural Patient position: sitting Prep: site prepped and draped and DuraPrep Patient monitoring: continuous pulse ox and blood pressure Approach: midline Injection technique: LOR air  Needle:  Needle type: Tuohy  Needle gauge: 17 G Needle length: 9 cm and 9 Needle insertion depth: 7 cm Catheter type: closed end flexible Catheter size: 19 Gauge Catheter at skin depth: 14 cm Test dose: negative  Assessment Events: blood not aspirated, injection not painful, no injection resistance, negative IV test and no paresthesia  Additional Notes Spinal Dosage in OR; Sprotte thru touhy  Bupivicaine ml       1.6 PFMS04   mcg        100 Fentanyl mcg            25

## 2014-09-03 NOTE — Progress Notes (Addendum)
I assisted Lynnette RN wth some questions  I was present during the pelvic exam with Mylo Red- Dishmon CNM, by Juliann Mule Interpreter

## 2014-09-03 NOTE — Progress Notes (Signed)
Patient ID: Lorraine [name], female   DOB: May 09, 1986, 28 y.o.   MRN: [id number]  Pt c/o leakage of fluid (patient not bleeding after clot removed early this morning).  RN sees fluid on perineum and sheet and collected amniosure which is positive.  Pt also having painful contractions q4-6 minutes.  Pt has had 2 prior c/s and desires repeat with BTL.   The risks of cesarean section discussed with the patient included but were not limited to: bleeding which may require transfusion or reoperation; infection which may require antibiotics; injury to bowel, bladder, ureters or other surrounding organs; injury to the fetus; need for additional procedures including hysterectomy in the event of a life-threatening hemorrhage; placental abnormalities wth subsequent pregnancies, incisional problems, thromboembolic phenomenon and other postoperative/anesthesia complications. The patient concurred with the proposed plan, giving informed written consent for the procedure.  Patient desires permanent sterilization.  Other reversible forms of contraception were discussed with patient; she declines all other modalities. Risks of procedure discussed with patient including but not limited to: risk of regret, permanence of method, bleeding, infection, injury to surrounding organs and need for additional procedures.  Failure risk of 0.5-1% with increased risk of ectopic gestation if pregnancy occurs was also discussed with patient.    Patient verbalized understanding of these risks and wants to proceed with sterilization.  Written informed consent obtained.  To OR when ready.  Eda Royal interpreting.

## 2014-09-04 LAB — CBC
HEMATOCRIT: 27.8 % — AB (ref 36.0–46.0)
Hemoglobin: 9.3 g/dL — ABNORMAL LOW (ref 12.0–15.0)
MCH: 30 pg (ref 26.0–34.0)
MCHC: 33.5 g/dL (ref 30.0–36.0)
MCV: 89.7 fL (ref 78.0–100.0)
PLATELETS: 195 10*3/uL (ref 150–400)
RBC: 3.1 MIL/uL — AB (ref 3.87–5.11)
RDW: 13.8 % (ref 11.5–15.5)
WBC: 8.6 10*3/uL (ref 4.0–10.5)

## 2014-09-04 LAB — RPR

## 2014-09-04 NOTE — Progress Notes (Signed)
I assisted Casey Burkitt with some questions, and explanation for her plan of care. I was present at the time Caryl Pina explain to bottle feed the baby. By Juliann Mule Interpreter

## 2014-09-04 NOTE — Progress Notes (Signed)
Checked on patient needs and ordered patient dinner. [place]

## 2014-09-04 NOTE — Progress Notes (Signed)
Assisted RN with shift change assessment. [place]

## 2014-09-04 NOTE — Lactation Note (Signed)
This note was copied from the chart of Lorraine Tiauna Perez-Matiano. Lactation Consultation Note  Mother is concerned because she can not express any milk.  Reviewed with her timeline for milk production and expression.  I reviewed hand expression with her but no colostrum was expressed.  We reviewed  late preterm behavior and the feeding plan which is to BF at least every 3 hours, follow-up with 30 ml of formula in a bottle and post pump for 10 minutes. Follow-up tomorrow.  Patient Name: Lorraine [name] Date: 09/04/2014     Maternal Data    Feeding Feeding Type: Breast Fed Nipple Type: Slow - flow Length of feed: 10 min  LATCH Score/Interventions Latch: Too sleepy or reluctant, no latch achieved, no sucking elicited.  Audible Swallowing: None  Type of Nipple: Everted at rest and after stimulation  Comfort (Breast/Nipple): Soft / non-tender     Hold (Positioning): No assistance needed to correctly position infant at breast.  LATCH Score: 6  Lactation Tools Discussed/Used     Consult Status Consult Status: Follow-up Date: 09/05/14 Follow-up type: In-patient    Lorraine [name] 09/04/2014, 2:59 PM

## 2014-09-04 NOTE — Progress Notes (Signed)
I assisted Lorraine [name] with some questions about baby, also I ordered patients breakfast, By Juliann Mule

## 2014-09-04 NOTE — Progress Notes (Signed)
Checked on patients needs. Patient was in shower.  Refused dinner other than small snack, visitors had brought patient food.  [place]

## 2014-09-04 NOTE — Progress Notes (Signed)
Post Partum Day 1 Subjective: no complaints, up ad lib, voiding, tolerating PO and + flatus  Objective: Blood pressure 108/66, pulse 91, temperature 98.3 F (36.8 C), temperature source Oral, resp. rate 20, last menstrual period 11/27/2013, SpO2 95 %.  Physical Exam:  General: alert, cooperative, appears stated age and no distress Lochia: appropriate Uterine Fundus: firm Incision: healing well, no significant drainage, no dehiscence, no significant erythema DVT Evaluation: No evidence of DVT seen on physical exam. Negative Homan's sign.   Recent Labs  09/03/14 0510 09/04/14 0600  HGB 11.2* 9.3*  HCT 33.0* 27.8*    Assessment/Plan: Plan for discharge tomorrow, pt doing well.  Pain controlled with pain meds.     LOS: 1 day   Remus Blake 09/04/2014, 9:49 AM

## 2014-09-05 NOTE — Progress Notes (Signed)
Jud with Lactation with interpretation of breastfeeding instructions.  [place]

## 2014-09-05 NOTE — Progress Notes (Signed)
Assisted RN with interpretation of patient questions.  [place]

## 2014-09-05 NOTE — Progress Notes (Signed)
Checked in on patients needs along with RN.  [place]

## 2014-09-05 NOTE — Lactation Note (Signed)
This note was copied from the chart of Lorraine [name]. Lactation Consultation Note  Patient Name: Lorraine Chanese Hartsough [id number] Date: 09/05/2014 Reason for consult: Follow-up assessment;Infant weight loss;Infant < 6lbs;Late preterm infant Mom is both breastfeeding and supplementing baby with 19-calorie formula due to baby's LPI status at 36 weeks and weight loss 6% and >48 hours.  Mom speaks Spanish and husband also present, with interpreter, Benita.  Mom has Baby and Me and written feeding guidelines in Spanish but amounts of supplement based on LPI policy are slightly greater ([place] wrote changes to volumes based on day of life)   Mom states she breastfed and supplemented with 20 ml's of formula after recent feeding less than an hour ago and baby asleep in arms of FOB.  LC, via interpreter, reviewed verbal and written plan of mom cue feeding at breast and supplementing 20-30 ml's at least q3h after breastfeeding, then DEBP for 15 minutes combined with breast massage and hand expression.  LC demonstrated hand expression but mom's breasts are still soft and scant drops visible with hand expression.  LC discussed supply and demand for milk production and need to stimulate with DEBP at least q3h, even if no milk yet flowing.  Mom received WIC during pregnancy and day-shift LC can arrange for [facility] pump prior to discharge or [place] (briefly discussed options).  Mom denies any other questions.  Mom does report feeling breast tenderness and darkening of areolas and nipples during pregnancy which LC informed mom is a sign that her breasts were preparing to make milk for her baby.   Maternal Data Formula Feeding for Exclusion: No (LPI status requires supplement based on weight loss)  Feeding Feeding Type: Breast Milk Length of feed: 20 min  LATCH Score/Interventions         LATCH score=8 at night-shift feeding assessment per RN             Lactation Tools Discussed/Used WIC Program:  Yes Pump Review: Setup, frequency, and cleaning;Milk Storage (Baby and Me, page 16)   Consult Status Consult Status: Follow-up Date: 09/06/14 Follow-up type: In-patient    Junious Dresser [facility] 09/05/2014, 6:34 PM

## 2014-09-05 NOTE — Progress Notes (Signed)
Assisted RN with checking on patient needs and ordering dinner.  Lorraine [name]

## 2014-09-05 NOTE — Progress Notes (Signed)
Subjective: Postpartum Day 2: Cesarean Delivery Patient reports tolerating PO, + flatus and no problems voiding.    Objective: Vital signs in last 24 hours: Temp:  [98.3 F (36.8 C)-98.6 F (37 C)] 98.6 F (37 C) (12/20 0550) Pulse Rate:  [87-107] 87 (12/20 0550) Resp:  [18-20] 18 (12/20 0550) BP: (108-126)/(66-84) 115/72 mmHg (12/20 0550) SpO2:  [95 %-97 %] 97 % (12/19 1430)  Physical Exam:  General: alert and no distress Lochia: appropriate Uterine Fundus: firm Incision: honeycomb dressing in place.   DVT Evaluation: No evidence of DVT seen on physical exam.   Recent Labs  09/03/14 0510 09/04/14 0600  HGB 11.2* 9.3*  HCT 33.0* 27.8*    Assessment/Plan: Status post Cesarean section. Doing well postoperatively.  Continue current care Mother to be discharged with baby later today or tomorrow.  HARRAWAY-SMITH, Brendyn Mclaren 09/05/2014, 8:01 AM

## 2014-09-06 ENCOUNTER — Other Ambulatory Visit (HOSPITAL_COMMUNITY): Payer: Self-pay

## 2014-09-06 ENCOUNTER — Encounter (HOSPITAL_COMMUNITY): Payer: Self-pay | Admitting: *Deleted

## 2014-09-06 DIAGNOSIS — Z98891 History of uterine scar from previous surgery: Secondary | ICD-10-CM

## 2014-09-06 MED ORDER — DOCUSATE SODIUM 100 MG PO CAPS: 100.0000 mg | ORAL_CAPSULE | Freq: Two times a day (BID) | ORAL | Status: DC | PRN

## 2014-09-06 MED ORDER — MENTHOL 3 MG MT LOZG: 1.0000 | LOZENGE | OROMUCOSAL | Status: DC | PRN

## 2014-09-06 MED ORDER — OXYCODONE-ACETAMINOPHEN 5-325 MG PO TABS: 1.0000 | ORAL_TABLET | Freq: Four times a day (QID) | ORAL | Status: DC | PRN

## 2014-09-06 MED ORDER — IBUPROFEN 600 MG PO TABS: 600.0000 mg | ORAL_TABLET | Freq: Four times a day (QID) | ORAL | Status: DC

## 2014-09-06 NOTE — Progress Notes (Signed)
I assisted Heather, RN with questions and Rod Holler with Lactation. [place]  Interpreter.

## 2014-09-06 NOTE — Discharge Summary (Signed)
Obstetric Discharge Summary Reason for Admission: rLTCS for PPROM Prenatal Procedures: ultrasound Intrapartum Procedures: cesarean: low cervical, transverse and tubal ligation Postpartum Procedures: none Complications-Operative and Postpartum: none HEMOGLOBIN  Date Value Ref Range Status  09/04/2014 9.3* 12.0 - 15.0 g/dL Final    Comment:    REPEATED TO VERIFY DELTA CHECK NOTED    HCT  Date Value Ref Range Status  09/04/2014 27.8* 36.0 - 46.0 % Final    Physical Exam:  General: alert, cooperative and no distress Lochia: appropriate Uterine Fundus: firm Incision: no significant drainage, honeycomb dressing in place DVT Evaluation: No evidence of DVT seen on physical exam. Negative Homan's sign. No cords or calf tenderness. No significant calf/ankle edema.  Discharge Diagnoses: Term Pregnancy-delivered  Discharge Information: Date: 09/06/2014 Activity: pelvic rest Diet: routine Medications: Ibuprofen, Colace and Percocet Condition: stable Instructions: refer to practice specific booklet Discharge to: home Follow-up Information    Follow up with [facility].   Specialty:  Obstetrics and Gynecology   Why:  In 4-6 weeks for postpartum visit   Contact information:   [place] [phone]      Newborn Data: Live born female  Birth Weight: 6 lb 2.2 oz (2784 g) APGAR: 9, 10  Home with mother.  LEFTWICH-KIRBY, Toryn Dewalt 09/06/2014, 7:55 AM

## 2014-09-06 NOTE — Discharge Instructions (Signed)
Parto por cesrea - Cuidados posteriores  (Cesarean Delivery, Care After) Siga estas instrucciones durante las prximas semanas. Estas indicaciones le proporcionan informacin general acerca de cmo deber cuidarse despus del procedimiento. El mdico tambin podr darle instrucciones ms especficas. El tratamiento se ha planificado de acuerdo a las prcticas mdicas actuales, pero a veces se producen problemas. Comunquese con el mdico si tiene algn problema o tiene dudas cuando vuelva a su casa.  INSTRUCCIONES PARA EL CUIDADO EN EL HOGAR  Tome slo medicamentos de venta libre o recetados, segn las indicaciones del mdico.  No beba alcohol, especialmente si est amamantando o toma analgsicos.  Nomastique tabaco ni fume.  Contine con un adecuado cuidado perineal. El buen cuidado perineal incluye:  Higienizarse de adelante hacia atrs.  Mantener la zona perineal limpia.  Controlar diariamente el corte (incisin) y observar si aumenta el enrojecimiento, si supura, se hincha o se separa la piel.  Limpie la incisin suavemente con jabn y agua todos los das, y luego squela dando golpecitos. Si el mdico la autoriza, deje la incisin al descubierto. Use un apsito (vendaje) si drena lquido o la incisin parece irritada. Si las pequeas tiras [facility] que cruzan la incisin no se caen dentro de los 7 das, retrelas suavemente.  Abrace una almohada al toser o estornudar hasta que la incisin se cure. Esto ayuda a Best boy.  No conduzca vehculos ni opere maquinarias hasta que el mdico la autorice.  Dchese, lvese el cabello y tome baos de inmersin segn las indicaciones de su mdico.  Utilice un sostn que le ajuste bien y que brinde buen soporte a sus Glass blower/designer.  Limite el uso de bombachas de sostn o medias panty.  Beba suficiente lquido para Consulting civil engineer orina clara o de color amarillo plido.  Consuma todos los das alimentos ricos en fibra como cereales y panes  [place], arroz, frijoles y frutas frescas y verduras. Estos alimentos pueden ayudarla a prevenir o Cytogeneticist.  Reanude las actividades como subir escaleras, conducir automviles, levantar objetos pesados, hacer ejercicios o viajar cuando le indique su mdico.  Hable con su mdico acerca de reanudar la actividad sexual. Volver a la actividad sexual depende del riesgo de infeccin, la velocidad de la curacin y la comodidad y su deseo de Financial controller.  Trate de que alguien la ayude con las actividades del hogar y con el recin nacido al menos durante algunos das despus de salir del hospital.  Descanse todo lo que pueda. Trate de descansar o tomar una siesta mientras el beb est durmiendo.  Aumente sus actividades gradualmente.  Cumpla con todos los controles programados para despus del [place]. Es muy importante asistir a todas las visitas de Nurse, adult. En estas visitas, su mdico va a controlarla para asegurarse de que est sanando fsica y emocionalmente. SOLICITE ATENCIN MDICA SI:   Elimina cogulos grandes por la vagina. Guarde algunos cogulos para mostrarle al mdico.  Tiene una secrecin con feo olor que proviene de la vagina.  Tiene dificultad para orinar.  Orina con frecuencia.  Siente dolor al [employer].  Nota un cambio en sus movimientos intestinales.  Aumenta el enrojecimiento, el dolor o la hinchazn en la zona de la incisin.  Observa que supura pus en la incisin.  La incisin se abre.  Sus [employer] duelen, estn duras o enrojecidas.  Sufre un dolor intenso de [place].  Tiene visin borrosa o ve manchas.  Se siente triste o deprimida.  Tiene pensamientos acerca de lastimarse o daar al  recién nacido. °· Tiene preguntas acerca de su cuidado, la atención del recién nacido o acerca de los medicamentos. °· Se siente mareada o sufre un desmayo. °· Tiene una erupción. °· Siente dolor u observa enrojecimiento o hinchazón en el sitio en que  estaba la vía intravenosa (IV). °· Tiene náuseas o vómitos. °· Usted dejó de amamantar al bebé y no ha tenido su período menstrual dentro de las 12 semanas siguientes. °· No amamanta al bebé y no tuvo su período menstrual en las últimas 12 semanas. °· Tiene fiebre. °SOLICITE ATENCIÓN MÉDICA DE INMEDIATO SI:  °· Siente dolor persistente. °· Siente dolor en el pecho. °· Le falta el aire. °· Se desmaya. °· Siente dolor en la pierna. °· Siente dolor en el estómago. °· El sangrado vaginal satura dos o más apósitos en 1 hora. °ASEGÚRESE DE QUE:  °· Comprende estas instrucciones. °· Controlará su enfermedad. °· Recibirá ayuda de inmediato si no mejora o si empeora. °Document Released: 09/03/2005 Document Revised: 01/18/2014 °ExitCare® Patient Information ©2015 [employer]. This information is not intended to replace advice given to you by your health care provider. Make sure you discuss any questions you have with your health care provider. ° °

## 2014-09-06 NOTE — Lactation Note (Addendum)
This note was copied from the chart of Lorraine [name]. Lactation Consultation Note [place] interpreter present. Upon entering the room FOB giving baby formula bottle. Baby has not breastfed since 82. Suggest mother put baby to the breast q3 every feeding to establish her milk supply and then provide supplement. Provided volume guidelines in spanish and reviewed with parents. Reviewed engorgement care. Faxed form to [facility] for rental pump.   Patient Name: Lorraine Chantale Leugers ACZYS'A Date: 09/06/2014     Maternal Data    Feeding    LATCH Score/Interventions                      Lactation Tools Discussed/Used     Consult Status      Vivianne Master [facility] 09/06/2014, 9:23 AM

## 2014-09-07 ENCOUNTER — Encounter (HOSPITAL_COMMUNITY): Payer: Self-pay | Admitting: Obstetrics & Gynecology

## 2014-09-07 ENCOUNTER — Encounter (HOSPITAL_COMMUNITY): Admission: RE | Payer: Self-pay | Source: Ambulatory Visit

## 2014-09-07 ENCOUNTER — Inpatient Hospital Stay (HOSPITAL_COMMUNITY): Admission: RE | Admit: 2014-09-07 | Payer: MEDICAID | Source: Ambulatory Visit | Admitting: Obstetrics & Gynecology

## 2014-09-07 SURGERY — Surgical Case
Anesthesia: Regional | Laterality: Bilateral

## 2014-09-15 ENCOUNTER — Encounter (HOSPITAL_COMMUNITY): Payer: Self-pay | Admitting: *Deleted

## 2014-09-30 ENCOUNTER — Encounter (HOSPITAL_COMMUNITY): Payer: Self-pay | Admitting: Obstetrics & Gynecology

## 2014-10-18 ENCOUNTER — Ambulatory Visit: Payer: Self-pay | Admitting: Obstetrics & Gynecology

## 2014-10-22 ENCOUNTER — Ambulatory Visit (INDEPENDENT_AMBULATORY_CARE_PROVIDER_SITE_OTHER): Payer: Self-pay | Admitting: Family

## 2014-10-22 ENCOUNTER — Encounter: Payer: Self-pay | Admitting: Family

## 2014-10-22 VITALS — BP 119/60 | HR 68 | Temp 98.0°F | Wt 195.5 lb

## 2014-10-22 DIAGNOSIS — Z8751 Personal history of pre-term labor: Secondary | ICD-10-CM

## 2014-10-22 NOTE — Progress Notes (Signed)
Alis Herrrera used as interperter for this encounter.

## 2014-10-22 NOTE — Progress Notes (Signed)
  Subjective:     Lorraine [name] is a 29 y.o. female who presents for a postpartum visit. She is 6 weeks postpartum following a low cervical transverse Cesarean section. I have fully reviewed the prenatal and intrapartum course. The delivery was at 24 gestational weeks due to PPROM. Outcome: repeat cesarean section, low transverse incision. Anesthesia: spinal. Postpartum course has been unremarkable. Baby's course has been unremarkable. Baby is feeding by both breast and bottle - Enfamil AR. Bleeding no bleeding. Bleeding stopped x 3 weeks.  Bowel function is normal. Bladder function is normal. Patient is not sexually active. Contraception method is tubal ligation. Postpartum depression screening: negative.  The following portions of the patient's history were reviewed and updated as appropriate: allergies, current medications, past family history, past medical history, past social history, past surgical history and problem list.  Review of Systems Pertinent items are noted in HPI.   Objective:   General:  alert, cooperative and appears stated age   Breasts:  inspection negative, no nipple discharge or bleeding, no masses or nodularity palpable  Lungs: clear to auscultation bilaterally  Heart:  regular rate and rhythm, S1, S2 normal, no murmur, click, rub or gallop  Abdomen: soft, non-tender; bowel sounds normal; no masses,  no organomegaly; incision site well healed, no signs of infection  Pelvic exam not indicated - not bleeding, no pain at vaginal area Assessment:   Normal postpartum exam. Pap smear not done at today's visit.   Plan:    1. Contraception: tubal ligation 2. Resume intercourse 3. Follow up as needed.    Venia Carbon Michiel Cowboy, CNM

## 2014-10-23 ENCOUNTER — Encounter (HOSPITAL_COMMUNITY): Payer: Self-pay | Admitting: Emergency Medicine

## 2014-10-23 ENCOUNTER — Emergency Department (HOSPITAL_COMMUNITY)
Admission: EM | Admit: 2014-10-23 | Discharge: 2014-10-24 | Disposition: A | Discharge status: Home / Self Care | Payer: Self-pay | Attending: Emergency Medicine | Admitting: Emergency Medicine

## 2014-10-23 DIAGNOSIS — Z8742 Personal history of other diseases of the female genital tract: Secondary | ICD-10-CM | POA: Insufficient documentation

## 2014-10-23 DIAGNOSIS — Z3202 Encounter for pregnancy test, result negative: Secondary | ICD-10-CM | POA: Insufficient documentation

## 2014-10-23 DIAGNOSIS — Z79899 Other long term (current) drug therapy: Secondary | ICD-10-CM | POA: Insufficient documentation

## 2014-10-23 DIAGNOSIS — R112 Nausea with vomiting, unspecified: Secondary | ICD-10-CM | POA: Insufficient documentation

## 2014-10-23 DIAGNOSIS — Z8719 Personal history of other diseases of the digestive system: Secondary | ICD-10-CM | POA: Insufficient documentation

## 2014-10-23 DIAGNOSIS — R000 Tachycardia, unspecified: Secondary | ICD-10-CM | POA: Insufficient documentation

## 2014-10-23 DIAGNOSIS — Z9851 Tubal ligation status: Secondary | ICD-10-CM | POA: Insufficient documentation

## 2014-10-23 DIAGNOSIS — Z9089 Acquired absence of other organs: Secondary | ICD-10-CM | POA: Insufficient documentation

## 2014-10-23 DIAGNOSIS — R1031 Right lower quadrant pain: Secondary | ICD-10-CM | POA: Insufficient documentation

## 2014-10-23 DIAGNOSIS — R6883 Chills (without fever): Secondary | ICD-10-CM | POA: Insufficient documentation

## 2014-10-23 DIAGNOSIS — Z9889 Other specified postprocedural states: Secondary | ICD-10-CM | POA: Insufficient documentation

## 2014-10-23 LAB — COMPREHENSIVE METABOLIC PANEL
ALBUMIN: 4.1 g/dL (ref 3.5–5.2)
ALT: 52 U/L — AB (ref 0–35)
ANION GAP: 6 (ref 5–15)
AST: 44 U/L — AB (ref 0–37)
Alkaline Phosphatase: 105 U/L (ref 39–117)
BILIRUBIN TOTAL: 0.5 mg/dL (ref 0.3–1.2)
BUN: 16 mg/dL (ref 6–23)
CALCIUM: 9.2 mg/dL (ref 8.4–10.5)
CO2: 31 mmol/L (ref 19–32)
Chloride: 104 mmol/L (ref 96–112)
Creatinine, Ser: 0.74 mg/dL (ref 0.50–1.10)
GFR calc non Af Amer: >90 mL/min (ref >90)
GLUCOSE: 94 mg/dL (ref 70–99)
POTASSIUM: 3.8 mmol/L (ref 3.5–5.1)
SODIUM: 141 mmol/L (ref 135–145)
Total Protein: 7.4 g/dL (ref 6.0–8.3)

## 2014-10-23 LAB — CBC WITH DIFFERENTIAL/PLATELET
Basophils Absolute: 0 10*3/uL (ref 0.0–0.1)
Basophils Relative: 0 % (ref 0–1)
EOS PCT: 1 % (ref 0–5)
Eosinophils Absolute: 0.1 10*3/uL (ref 0.0–0.7)
HCT: 37.3 % (ref 36.0–46.0)
Hemoglobin: 12.5 g/dL (ref 12.0–15.0)
LYMPHS PCT: 18 % (ref 12–46)
Lymphs Abs: 1.5 10*3/uL (ref 0.7–4.0)
MCH: 28.9 pg (ref 26.0–34.0)
MCHC: 33.5 g/dL (ref 30.0–36.0)
MCV: 86.3 fL (ref 78.0–100.0)
Monocytes Absolute: 0.3 10*3/uL (ref 0.1–1.0)
Monocytes Relative: 3 % (ref 3–12)
NEUTROS ABS: 6.1 10*3/uL (ref 1.7–7.7)
Neutrophils Relative %: 78 % — ABNORMAL HIGH (ref 43–77)
PLATELETS: 326 10*3/uL (ref 150–400)
RBC: 4.32 MIL/uL (ref 3.87–5.11)
RDW: 13.9 % (ref 11.5–15.5)
WBC: 7.9 10*3/uL (ref 4.0–10.5)

## 2014-10-23 LAB — URINALYSIS, ROUTINE W REFLEX MICROSCOPIC
Bilirubin Urine: NEGATIVE
GLUCOSE, UA: NEGATIVE mg/dL
Hgb urine dipstick: NEGATIVE
Ketones, ur: NEGATIVE mg/dL
LEUKOCYTES UA: NEGATIVE
NITRITE: NEGATIVE
PH: 7 (ref 5.0–8.0)
Protein, ur: NEGATIVE mg/dL
SPECIFIC GRAVITY, URINE: 1.03 (ref 1.005–1.030)
UROBILINOGEN UA: 0.2 mg/dL (ref 0.0–1.0)

## 2014-10-23 LAB — LIPASE, BLOOD: LIPASE: 31 U/L (ref 11–59)

## 2014-10-23 LAB — POC URINE PREG, ED: PREG TEST UR: NEGATIVE

## 2014-10-23 MED ORDER — ONDANSETRON HCL 4 MG/2ML IJ SOLN
4.0000 mg | Freq: Once | INTRAMUSCULAR | Status: AC
  Administered 2014-10-23: 4 mg via INTRAVENOUS
  Filled 2014-10-23: qty 2

## 2014-10-23 MED ORDER — IOHEXOL 300 MG/ML  SOLN
25.0000 mL | Freq: Once | INTRAMUSCULAR | Status: AC | PRN
  Administered 2014-10-23: 25 mL via ORAL

## 2014-10-23 MED ORDER — HYDROMORPHONE HCL 1 MG/ML IJ SOLN
1.0000 mg | Freq: Once | INTRAMUSCULAR | Status: AC
  Administered 2014-10-23: 1 mg via INTRAVENOUS
  Filled 2014-10-23: qty 1

## 2014-10-23 MED ORDER — MORPHINE SULFATE 4 MG/ML IJ SOLN
4.0000 mg | Freq: Once | INTRAMUSCULAR | Status: AC
  Administered 2014-10-23: 4 mg via INTRAVENOUS
  Filled 2014-10-23: qty 1

## 2014-10-23 MED ORDER — PROMETHAZINE HCL 25 MG/ML IJ SOLN
25.0000 mg | Freq: Once | INTRAMUSCULAR | Status: AC
  Administered 2014-10-23: 25 mg via INTRAVENOUS
  Filled 2014-10-23: qty 1

## 2014-10-23 NOTE — ED Notes (Signed)
[place] interpreter line used to obtain the following. Patient here with complaint of right upper and lower abdominal pain accompanied by vomiting, chills, and body aches. Denies diarrhea. Recent pregnancy and has not had period since March of last year. C-section was required for child birth this time as previously reported via medical history.

## 2014-10-23 NOTE — ED Provider Notes (Signed)
CSN: [id number]     Arrival date & time 10/23/14  2105 History   First MD Initiated Contact with Patient 10/23/14 2141     Chief Complaint  Patient presents with  . Chills  . Abdominal Pain  . Emesis     (Consider location/radiation/quality/duration/timing/severity/associated sxs/prior Treatment) HPI Comments: Patient is a 29 year old female who is about 1 month post partum who presents with abdominal pain that started yesterday. The pain is located in the RLQ and radiates throughout her entire abdomen. The pain is described as cramping and severe. The pain started gradually and progressively worsened since the onset. No alleviating/aggravating factors. The patient has tried nothing for symptoms without relief. Associated symptoms include nausea, chills and vomiting. Patient denies fever, headache, diarrhea, chest pain, SOB, dysuria, constipation, abnormal vaginal bleeding/discharge. Patient had a c-section on September 13, 8365 without complications. Patient reports previous cholecystectomy. LMP March 2015 due to recent pregnancy.      Past Medical History  Diagnosis Date  . Diverticulitis   . Pyelonephritis    Past Surgical History  Procedure Laterality Date  . Cholecystectomy    . Cesarean section    . Cesarean section N/A 2006  . Cesarean section N/A 09/03/2014    Procedure: CESAREAN SECTION;  Surgeon: Guss Bunde, MD;  Location: [facility];  Service: Obstetrics;  Laterality: N/A;  . Tubal ligation     Family History  Problem Relation Age of Onset  . Hyperlipidemia Mother   . Diabetes Maternal Uncle   . Diabetes Paternal Grandmother    History  Substance Use Topics  . Smoking status: Never Smoker   . Smokeless tobacco: Never Used  . Alcohol Use: No   OB History    Gravida Para Term Preterm AB TAB SAB Ectopic Multiple Living   5 3 1 2 2  2  1 2      Review of Systems  Constitutional: Positive for chills. Negative for fever and fatigue.  HENT: Negative for trouble  swallowing.   Eyes: Negative for visual disturbance.  Respiratory: Negative for shortness of breath.   Cardiovascular: Negative for chest pain and palpitations.  Gastrointestinal: Positive for nausea, vomiting and abdominal pain. Negative for diarrhea.  Genitourinary: Negative for dysuria and difficulty urinating.  Musculoskeletal: Negative for arthralgias and neck pain.  Skin: Negative for color change.  Neurological: Negative for dizziness and weakness.  Psychiatric/Behavioral: Negative for dysphoric mood.      Allergies  Review of patient's allergies indicates no known allergies.  Home Medications   Prior to Admission medications   Medication Sig Start Date End Date Taking? Authorizing Provider  albuterol (PROVENTIL HFA;VENTOLIN HFA) 108 (90 BASE) MCG/ACT inhaler Inhale 4 puffs into the lungs every 6 (six) hours as needed for wheezing or shortness of breath. 08/13/14  Yes Cordelia Poche, MD  Prenatal Vit-Fe Fumarate-FA (MULTIVITAMIN-PRENATAL) 27-0.8 MG TABS tablet Take 1 tablet by mouth daily at 12 noon.   Yes Historical Provider, MD  benzonatate (TESSALON) 100 MG capsule Take 1 capsule (100 mg total) by mouth 3 (three) times daily as needed for cough. Patient not taking: Reported on 10/22/2014 08/23/14   Shelbie Hutching, MD  docusate sodium (COLACE) 100 MG capsule Take 1 capsule (100 mg total) by mouth 2 (two) times daily as needed. Patient not taking: Reported on 10/22/2014 09/06/14   Lattie Haw A Leftwich-Kirby, CNM  ibuprofen (ADVIL,MOTRIN) 600 MG tablet Take 1 tablet (600 mg total) by mouth every 6 (six) hours. Patient not taking: Reported on 10/22/2014 09/06/14  Kathie Dike Leftwich-Kirby, CNM  oxyCODONE-acetaminophen (PERCOCET/ROXICET) 5-325 MG per tablet Take 1-2 tablets by mouth every 6 (six) hours as needed (for pain scale less than 7). Patient not taking: Reported on 10/22/2014 09/06/14   Kathie Dike Leftwich-Kirby, CNM  promethazine (PHENERGAN) 25 MG tablet Take 1 tablet (25 mg total) by mouth  every 6 (six) hours as needed for nausea or vomiting. Patient not taking: Reported on 10/22/2014 08/23/14   Shelbie Hutching, MD   BP 114/90 mmHg  Pulse 115  Temp(Src) 98.7 F (37.1 C) (Oral)  Resp 22  SpO2 100%  LMP 12/15/2013 (Exact Date) Physical Exam  Constitutional: She is oriented to person, place, and time. She appears well-developed and well-nourished. No distress.  HENT:  Head: Normocephalic and atraumatic.  Eyes: Conjunctivae and EOM are normal.  Neck: Normal range of motion.  Cardiovascular: Normal rate and regular rhythm.  Exam reveals no gallop and no friction rub.   No murmur heard. Pulmonary/Chest: Effort normal and breath sounds normal. She has no wheezes. She has no rales. She exhibits no tenderness.  Abdominal: Soft. She exhibits no distension. There is tenderness. There is no rebound and no guarding.  RLQ tenderness to palpation. No other focal tenderness or peritoneal signs. Well-healed c-section scar of suprapubic area.   Musculoskeletal: Normal range of motion.  Neurological: She is alert and oriented to person, place, and time. Coordination normal.  Speech is goal-oriented. Moves limbs without ataxia.   Skin: Skin is warm and dry.  Psychiatric: She has a normal mood and affect. Her behavior is normal.  Nursing note and vitals reviewed.   ED Course  Procedures (including critical care time) Labs Review Labs Reviewed  CBC WITH DIFFERENTIAL/PLATELET - Abnormal; Notable for the following:    Neutrophils Relative % 78 (*)    All other components within normal limits  COMPREHENSIVE METABOLIC PANEL - Abnormal; Notable for the following:    AST 44 (*)    ALT 52 (*)    All other components within normal limits  BASIC METABOLIC PANEL - Abnormal; Notable for the following:    Calcium 7.5 (*)    All other components within normal limits  LIPASE, BLOOD  URINALYSIS, ROUTINE W REFLEX MICROSCOPIC  D-DIMER, QUANTITATIVE  POC URINE PREG, ED  I-STAT CG4 LACTIC ACID, ED   I-STAT CG4 LACTIC ACID, ED    Imaging Review Ct Angio Chest Pe W/cm &/or Wo Cm  10/24/2014   CLINICAL DATA:  Tachycardia. Recent postpartum. Concern for pulmonary embolus.  EXAM: CT ANGIOGRAPHY CHEST WITH CONTRAST  TECHNIQUE: Multidetector CT imaging of the chest was performed using the standard protocol during bolus administration of intravenous contrast. Multiplanar CT image reconstructions and MIPs were obtained to evaluate the vascular anatomy.  CONTRAST:  42mL OMNIPAQUE IOHEXOL 350 MG/ML SOLN  COMPARISON:  None.  FINDINGS: There are no filling defects within the pulmonary arteries to suggest pulmonary embolus. The thoracic aorta is normal in caliber without dissection. The heart size is normal. There is no pleural or pericardial effusion. No mediastinal or hilar adenopathy.  There are hypoventilatory changes at the lung bases. Punctate calcified granuloma in the superior segment left lower lobe. No consolidation, pulmonary nodule or mass.  Upper abdomen evaluated earlier this day by CT, no acute abnormality.  No osseous abnormalities.  Review of the MIP images confirms the above findings.  IMPRESSION: 1. No pulmonary embolus. 2. Hypoventilatory change, no acute intrathoracic process. Punctate calcified granuloma in the left lung.   Electronically Signed   By:  Jeb Levering M.D.   On: 10/24/2014 06:04   Ct Abdomen Pelvis W Contrast  10/24/2014   CLINICAL DATA:  Right upper and lower quadrant abdominal pain. Vomiting, chills, body aches. Recent Caesarean section.  EXAM: CT ABDOMEN AND PELVIS WITH CONTRAST  TECHNIQUE: Multidetector CT imaging of the abdomen and pelvis was performed using the standard protocol following bolus administration of intravenous contrast.  CONTRAST:  25mL OMNIPAQUE IOHEXOL 300 MG/ML SOLN, 156mL OMNIPAQUE IOHEXOL 300 MG/ML SOLN  COMPARISON:  None.  FINDINGS: Mild motion artifact in the lung bases. Punctate granuloma in the left lower lobe.  Clips in the gallbladder fossa from  cholecystectomy. The liver, spleen, adrenal glands, and pancreas are normal. There is no biliary dilatation. Kidneys demonstrate symmetric enhancement without hydronephrosis or perinephric stranding. No localizing renal abnormality.  There are no dilated or thickened bowel loops. Terminal ileum is normal. The appendix is normal. There scattered colonic diverticula involving the descending colon without diverticulitis. No free air, free fluid, or intra-abdominal fluid collection. Tiny uncomplicated fat containing umbilical hernia.  Within the pelvis the urinary bladder is minimally distended. Uterus is at the upper limits of normal in size, tubal ligation clips are seen. There is a 3.7 cm cyst in the left ovary. The right ovary is normal. There is no pelvic free fluid. No pelvic fluid collection. No evidence pelvic inflammation. Postsurgical change in the lower anterior abdominal wall post Cesarean section. There is no subcutaneous fluid collection.  There are no acute or suspicious osseous abnormalities. Punctate bone island in the left acetabulum.  IMPRESSION: 1. No acute abnormality in the abdomen/pelvis. 2. Mild diverticulosis in the descending colon without diverticulitis. 3. Post cholecystectomy.  No biliary dilatation. 4. Left ovarian cyst.   Electronically Signed   By: Jeb Levering M.D.   On: 10/24/2014 01:35     EKG Interpretation None      MDM   Final diagnoses:  Tachycardia  Right lower quadrant abdominal pain    11:14 PM Labs and urinalysis pending. CT abdomen pelvis pending to rule out appendicitis.   Patient signed out to Antonietta Breach, PA-C.   Alvina Chou, PA-C 10/24/14 [place], MD 10/26/14 [phone]

## 2014-10-24 ENCOUNTER — Emergency Department (HOSPITAL_COMMUNITY): Payer: Self-pay

## 2014-10-24 ENCOUNTER — Encounter (HOSPITAL_COMMUNITY): Payer: Self-pay | Admitting: Radiology

## 2014-10-24 LAB — BASIC METABOLIC PANEL
Anion gap: 6 (ref 5–15)
BUN: 10 mg/dL (ref 6–23)
CALCIUM: 7.5 mg/dL — AB (ref 8.4–10.5)
CHLORIDE: 110 mmol/L (ref 96–112)
CO2: 23 mmol/L (ref 19–32)
CREATININE: 0.62 mg/dL (ref 0.50–1.10)
GFR calc non Af Amer: >90 mL/min (ref >90)
Glucose, Bld: 98 mg/dL (ref 70–99)
Potassium: 3.6 mmol/L (ref 3.5–5.1)
Sodium: 139 mmol/L (ref 135–145)

## 2014-10-24 LAB — D-DIMER, QUANTITATIVE: D-Dimer, Quant: 0.41 ug/mL-FEU (ref 0.00–0.48)

## 2014-10-24 LAB — I-STAT CG4 LACTIC ACID, ED
LACTIC ACID, VENOUS: 1.01 mmol/L (ref 0.5–2.0)
LACTIC ACID, VENOUS: 0.69 mmol/L (ref 0.5–2.0)

## 2014-10-24 MED ORDER — SODIUM CHLORIDE 0.9 % IV BOLUS (SEPSIS)
1000.0000 mL | Freq: Once | INTRAVENOUS | Status: AC
  Administered 2014-10-24: 1000 mL via INTRAVENOUS

## 2014-10-24 MED ORDER — ONDANSETRON HCL 4 MG PO TABS: 4.0000 mg | ORAL_TABLET | Freq: Three times a day (TID) | ORAL | Status: DC | PRN

## 2014-10-24 MED ORDER — OXYCODONE-ACETAMINOPHEN 5-325 MG PO TABS: 1.0000 | ORAL_TABLET | Freq: Four times a day (QID) | ORAL | Status: DC | PRN

## 2014-10-24 MED ORDER — SODIUM CHLORIDE 0.9 % IV BOLUS (SEPSIS)
2000.0000 mL | Freq: Once | INTRAVENOUS | Status: AC
Start: 2014-10-24 — End: 2014-10-24
  Administered 2014-10-24: 2000 mL via INTRAVENOUS

## 2014-10-24 MED ORDER — IOHEXOL 350 MG/ML SOLN
75.0000 mL | Freq: Once | INTRAVENOUS | Status: AC | PRN
  Administered 2014-10-24: 75 mL via INTRAVENOUS

## 2014-10-24 MED ORDER — HYDROMORPHONE HCL 1 MG/ML IJ SOLN
1.0000 mg | Freq: Once | INTRAMUSCULAR | Status: AC
  Administered 2014-10-24: 1 mg via INTRAVENOUS
  Filled 2014-10-24: qty 1

## 2014-10-24 MED ORDER — IOHEXOL 300 MG/ML  SOLN
100.0000 mL | Freq: Once | INTRAMUSCULAR | Status: AC | PRN
  Administered 2014-10-24: 100 mL via INTRAVENOUS

## 2014-10-24 MED ORDER — ONDANSETRON HCL 4 MG/2ML IJ SOLN
4.0000 mg | Freq: Once | INTRAMUSCULAR | Status: AC
  Administered 2014-10-24: 4 mg via INTRAVENOUS
  Filled 2014-10-24: qty 2

## 2014-10-24 NOTE — Discharge Instructions (Signed)
Dolor abdominal (Abdominal Pain) El dolor puede tener muchas causas. Normalmente la causa del dolor abdominal no es una enfermedad y Teacher, English as a foreign language sin Clinical research associate. Frecuentemente puede controlarse y tratarse en casa. Su mdico le Chartered certified accountant examen fsico y posiblemente solicite anlisis de sangre y radiografas para ayudar a Teacher, adult education la gravedad de su dolor. Sin embargo, en [employer], debe transcurrir ms tiempo antes de que se pueda Pension scheme manager una causa evidente del dolor. Antes de llegar a ese punto, es posible que su mdico no sepa si necesita ms pruebas o un tratamiento ms profundo. INSTRUCCIONES PARA EL CUIDADO EN EL HOGAR  Est atento al dolor para ver si hay cambios. Las siguientes indicaciones ayudarn a Chief Strategy Officer que pueda sentir:  [place] solo medicamentos de venta libre o recetados, segn las indicaciones del mdico.  No tome laxantes a menos que se lo haya indicado su mdico.  Pruebe con Ardelia Mems dieta lquida absoluta (caldo, t o agua) segn se lo indique su mdico. Introduzca gradualmente una dieta normal, segn su tolerancia. SOLICITE ATENCIN MDICA SI:  Tiene dolor abdominal sin explicacin.  Tiene dolor abdominal relacionado con nuseas o diarrea.  Tiene dolor cuando orina o defeca.  Experimenta dolor abdominal que lo despierta de noche.  Tiene dolor abdominal que empeora o mejora cuando come alimentos.  Tiene dolor abdominal que empeora cuando come alimentos grasosos.  Tiene fiebre. SOLICITE ATENCIN MDICA DE INMEDIATO SI:   El dolor no desaparece en un plazo mximo de 2horas.  No deja de (vomitar).  El Social research officer, government se siente solo en partes del abdomen, como el lado derecho o la parte inferior izquierda del abdomen.  Evaca materia fecal sanguinolenta o negra, de aspecto alquitranado. ASEGRESE DE QUE:  Comprende estas instrucciones.  Controlar su afeccin.  Recibir ayuda de inmediato si no mejora o si empeora. Document Released: 09/03/2005  Document Revised: 09/08/2013 [facility] Patient Information 2015 [place]. This information is not intended to replace advice given to you by your health care provider. Make sure you discuss any questions you have with your health care provider.   Taquicardia no especfica (Tachycardia, Nonspecific)  La taquicardia es un ritmo cardaco ms rpido de lo normal (ms de 100 latidos por minuto). En los adultos, la frecuencia cardaca normal es entre 60 y 100 veces por minuto. Puede ser un resultado de una respuesta normal al ejercicio o al estrs. No necesariamente significa que algo est mal. Sin embargo, a veces, cuando el corazn palpita demasiado rpido puede que no sea capaz de bombear suficiente sangre al resto del organismo. Esto puede causar Monsanto Company, dificultad para respirar, mareos e incluso desmayos. La taquicardia no especfica significa que se desconocen la causa o el patrn especfico de la taquicardia.  CAUSAS Puede ser inofensiva o puede deberse a una causa subyacente ms grave. Las posibles causas de la taquicardia son:   Actividad fsica o esfuerzo.  Cristy Hilts.  Dolor o traumatismos.  Infeccin.  Prdida de lquidos (deshidratacin).  Hiperactividad de la tiroides.  Prdida de glbulos rojos (anemia).  Ansiedad y estrs.  Alcohol.  Cafena.  Productos con tabaco.  Pastillas para adelgazar.  Drogas ilegales.  Enfermedades cardacas. SNTOMAS  Latidos cardacos rpidos o irregulares (palpitaciones).  Percepcin repentina de los latidos del corazn (conciencia cardiaca).  Mareos.  Cansancio (fatiga).  Falta de aire.  Dolor en el pecho.  Nuseas.  Desmayos DIAGNSTICO Su mdico le har un examen fsico y tomar su historia clnica. En algunos casos, podr consultar a un especialista del  corazn (cardilogo). El mdico tambin podr pedirle:   Anlisis de [place].  Electrocardiograma. Esta prueba registra la actividad elctrica del  corazn.  Monitoreo cardaco. TRATAMIENTO El tratamiento depender de la causa de la taquicardia. El objetivo es tratar la causa subyacente. Los mtodos de tratamiento pueden ser:   Reposicin de lquidos o de sangre a travs de una va intravenosa (IV) para moderar la deshidratacin o anemia graves.  Nuevos medicamentos o cambios en los medicamentos que toma actualmente.  Cambios en la dieta y [place].  Tratamiento para ciertas infecciones.  Mtodos para Programmer, multimedia o relajacin. INSTRUCCIONES PARA EL CUIDADO DOMICILIARIO  Descanso.  Beba gran cantidad de lquido para mantener la orina de tono claro o color amarillo plido.  No fume.  Evite:  Cafena.  Nicotina.  Alcohol.  Chocolate.  Algunos estimulantes que contienen las pldoras para dietas de venta libre o pldoras que ayudan a mantenerse despierto.  Situaciones que provocan ansiedad o estrs.  Consumo de drogas ilegales como la marihuana, la fenciclidina (PCP) y la cocana.  Tome slo la medicacin que le indic el [place] visitas de control, segn le indique su mdico. SOLICITE ATENCIN MDICA DE INMEDIATO SI:  Siente dolor en el pecho, en la parte superior del brazo, en la mandbula o el cuello.  Se siente mareado, aturdido o se desmaya.  Tiene palpitaciones que no desaparecen.  Vomita, tiene diarrea o elimina sangre con las heces.  Su piel est fra, plida y hmeda.  Tiene fiebre que no desaparece con el reposo, el lquido y los medicamentos. ASEGRESE QUE:  Comprende estas instrucciones.  Controlar su enfermedad.  Solicitar ayuda de inmediato si no mejora o empeora. Document Released: 09/03/2005 Document Revised: 11/26/2011 [facility] Patient Information 2015 [place]. This information is not intended to replace advice given to you by your health care provider. Make sure you discuss any questions you have with your health care provider.  Dolor de  pecho (no especfico) (Chest Pain (Nonspecific)) Con frecuencia es difcil dar un diagnstico especfico de la causa del dolor de [place]. Siempre hay una posibilidad de que el dolor podra estar relacionado con algo grave, como un ataque al corazn o un cogulo sanguneo en los pulmones. Debe someterse a controles con el mdico para ms evaluaciones. CAUSAS   Acidez.  Neumona o bronquitis.  Ansiedad o estrs.  Inflamacin de la zona que rodea al corazn (pericarditis) o a los pulmones (pleuritis o pleuresa).  Un cogulo sanguneo en el pulmn.  Colapso de un pulmn (neumotrax), que puede aparecer de [employer] repentina por s solo (neumotrax espontneo) o debido a un traumatismo en el trax.  Culebrilla (virus del herpes zster). La pared torcica est compuesta por huesos, msculos y Database administrator. Cualquiera de estos puede ser la fuente del dolor.  Puede haber una contusin en los huesos debido a una lesin.  Puede haber un esguince en los msculos o el cartlago ocasionado por la tos o por [place].  El cartlago puede verse afectado por una inflamacin y Engineer, production (costocondritis). DIAGNSTICO  Ileene Hutchinson se necesiten anlisis de laboratorio u otros estudios para Animator causa del Social research officer, government. Adems, puede indicarle que se haga una prueba llamada electrocadiograma (ECG) ambulatorio. El ECG registra los patrones de los latidos cardacos durante 24horas. Adems, pueden hacerle otros estudios, por ejemplo:  Ecocardiograma transtorcico (ETT). Durante IT trainer, se usan ondas sonoras para evaluar el flujo de la sangre a travs del corazn.  Ecocardiograma transesofgico (ETE).  Monitoreo cardaco. Permite que el mdico controle la frecuencia y el ritmo cardaco en tiempo real.  Monitor Holter. Es un dispositivo porttil que Albertson's latidos cardacos y [place] a Retail buyer las arritmias cardacas. Le permite al [employer] registrar la actividad [place], si es necesario.  Pruebas de estrs por ejercicio o por medicamentos que aceleran los latidos cardacos. TRATAMIENTO   El tratamiento depende de la causa del dolor de Waucoma. El tratamiento puede incluir:  Inhibidores de la acidez estomacal.  Antiinflamatorios.  Analgsicos para las enfermedades inflamatorias.  Antibiticos, si hay una infeccin.  Podrn aconsejarle que modifique su estilo de vida. Esto incluye dejar de fumar y evitar el alcohol, la cafena y el chocolate.  Pueden aconsejarle que mantenga la cabeza levantada (elevada) cuando duerme. Esto reduce la probabilidad de que el cido retroceda del estmago al esfago. En la [employer], el dolor de pecho no especfico mejorar en el trmino de 2 a 3das, con reposo y [employer].  INSTRUCCIONES PARA EL CUIDADO EN EL HOGAR   Si le prescriben antibiticos, tmelos tal como se le indic. Termnelos aunque comience a sentirse mejor.  [address] no haga actividades fsicas que provoquen dolor de [place]. Contine con las actividades fsicas tal como se le indic  No consuma ningn producto que contenga tabaco, incluidos cigarrillos, tabaco de Higher education careers adviser o cigarrillos electrnicos.  Evite el consumo de alcohol.  Tome los medicamentos solamente como se lo haya indicado el mdico.  Siga las sugerencias del mdico en lo que respecta a las pruebas adicionales, si el dolor de pecho no desaparece.  Concurra a todas las visitas de control programadas. Si no lo hace, podra desarrollar problemas permanentes (crnicos) relacionados con el dolor. Si hay algn problema para concurrir a una cita, llame para reprogramarla. SOLICITE ATENCIN MDICA SI:   El dolor de pecho no desaparece, incluso despus del tratamiento.  Tiene una erupcin cutnea con ampollas en el pecho.  Tiene fiebre. SOLICITE ATENCIN MDICA DE [employer] SI:   Aumenta el dolor de pecho o este se irradia hacia el brazo, el cuello,  la [place], la espalda o el abdomen.  Le falta el aire.  La tos empeora, o expectora sangre.  Siente dolor intenso en la espalda o el abdomen.  Se siente nauseoso o vomita.  Siente debilidad intensa.  Se desmaya.  Tiene escalofros. Esto es Engineer, maintenance (IT). No espere a ver si el dolor se pasa. Obtenga ayuda mdica de inmediato. Llame a los servicios de emergencia locales (911 en [place]). No conduzca por sus propios medios [employer] hospital. ASEGRESE DE QUE:   Comprende estas instrucciones.  Controlar su afeccin.  Recibir ayuda de inmediato si no mejora o si empeora. Document Released: 09/03/2005 Document Revised: 09/08/2013 [facility] Patient Information 2015 [place]. This information is not intended to replace advice given to you by your health care provider. Make sure you discuss any questions you have with your health care provider.   Emergency Department Resource Guide 1) Find a Doctor and Pay Out of Pocket Although you won't have to find out who is covered by your insurance plan, it is a good idea to ask around and get recommendations. You will then need to call the office and see if the doctor you have chosen will accept you as a new patient and what types of options they offer for patients who are self-pay. Some doctors offer discounts or will set up payment plans for their patients  who do not have insurance, but you will need to ask so you aren't surprised when you get to your appointment.  2) Contact Your Local Health Department Not all health departments have doctors that can see patients for sick visits, but many do, so it is worth a call to see if yours does. If you don't know where your local health department is, you can check in your phone book. The CDC also has a tool to help you locate your state's health department, and many state websites also have listings of all of their local health departments.  3) Find a [facility] If your illness  is not likely to be very severe or complicated, you may want to try a walk in clinic. These are popping up all over the country in pharmacies, drugstores, and shopping centers. They're usually staffed by nurse practitioners or physician assistants that have been trained to treat common illnesses and complaints. They're usually fairly quick and inexpensive. However, if you have serious medical issues or chronic medical problems, these are probably not your best option.  No Primary Care Doctor: - Call Health Connect at  [phone] - they can help you locate a primary care doctor that  accepts your insurance, provides certain services, etc. - Physician Referral Service- [phone]  Chronic Pain Problems: Organization         Address  Phone   Notes  [facility]  [phone] Patients need to be referred by their primary care doctor.   Medication Assistance: Organization         Address  Phone   Notes  [facility] Medication [facility] [place] [place], [place] [address] [phone] --Must be a resident of [facility] -- Must have NO insurance coverage whatsoever (no Medicaid/ Medicare, etc.) -- The pt. MUST have a primary care doctor that directs their care regularly and follows them in the community   MedAssist  [phone]   [employer]  [phone]    Agencies that provide inexpensive medical care: Organization         Address  Phone   Notes  [place]  [phone]   [facility]    [phone]   [facility] [place], [place] [address] [phone]   [place] [address] [place] [phone]   Planned Parenthood    [phone]   [facility]    [phone]   [place] and [place] [address], [place] Phone:  [phone], Fax:  [phone] Hours of Operation:  9 am - 6  pm, M-F.  Also accepts Medicaid/Medicare and self-pay.  [facility] for [place], [address], [place] Phone: [phone], Fax: [phone]. Hours of Operation:  8:30 am - 5:30 pm, M-F.  Also accepts Medicaid and self-pay.  [facility] [place] [address] [place] Phone: [phone]   [place], [place], [place] [phone], Ext. [phone] Mondays & Thursdays: 7-9 AM.  First 15 patients are seen on a first come, first serve basis.    [place] Providers:  Organization         Address  Phone   Notes  [facility] [place] [address], [place] [phone] Also accepts self-pay patients.  [facility] Family  Practice [place], [place]  [phone]   [place], [address], [place] [phone]   [facility] [address], [place] [phone]   Lucianne Lei [address], [place]   [phone] Only accepts [place] Access [place] patients after they have their name applied to their card.   Self-Pay (no insurance) in [facility]:  Organization         Address  Phone   Notes  Sickle Cell Patients, [facility] [place] [phone]   [facility] [place] [phone]   [facility] [place]  [place], [place], [place] [phone]   [facility]/Dr. Osei-Bonsu  [address] [place] or [address], [place] [phone] Phone number for both [place] and [place] locations is the same.  Urgent Medical and [facility] [address] [place] [phone]   [facility] [address], [place] or [address] [phone]   [facility] [address], [place] [phone], phone; [phone], fax Sees patients 1st and 3rd Saturday of every month.  Must not qualify for public or private insurance (i.e. Medicaid, Medicare, [place] Health Choice, Veterans' Benefits)  Household income should be no more than 200% of the poverty level The clinic cannot treat you if you are pregnant or think you are pregnant  Sexually transmitted diseases are not treated at the clinic.    Dental Care: Organization         Address  Phone  Notes  [facility] Department of [facility] [place] [phone] Accepts children up to age 61 who are enrolled in [place] or [place]; pregnant women with a Medicaid card; and children who have applied for Medicaid or [place] Health Choice, but were declined, whose parents can pay a reduced fee at time of service.  [facility] Department of [facility]  [address], [place] [phone] Accepts children up to age 60 who are enrolled in [place] or [place]; pregnant women with a Medicaid card; and children who have applied for Medicaid or [place] Health Choice, but were declined, whose parents can pay a reduced fee at time of service.  [place] Adult Dental Access PROGRAM  [place] [phone] Patients are seen by appointment only. Walk-ins are not accepted. [place] will see patients 29 years of age and older. Monday - Tuesday (8am-5pm) Most Wednesdays (8:30-5pm) $30 per visit, cash only  [facility] Adult Dental Access PROGRAM  [address], [facility] [phone] Patients are seen by appointment only. Walk-ins are not accepted. [place] will see patients 52 years of age and older. One Wednesday Evening (Monthly: Volunteer Based).  $30 per visit, cash only  [place]  [phone] for adults; Children under age 11, call Graduate Pediatric Dentistry at [phone]. Children aged 71-14, please call [phone] to request a pediatric application.  Dental services are provided in all areas of dental care including fillings, crowns and bridges, complete and partial dentures, implants, gum treatment, root canals, and extractions. Preventive care is also provided. Treatment is provided to both adults and children.  Patients are selected via a lottery and there is often a waiting list.   [facility] [address], [place]  [phone] [email or web address]   [facility] [address] [place], [place] [phone], Ext. [phone] Second and Fourth Thursday of each month, opens at 6:30 AM; Clinic ends at 9 AM.  Patients are seen on a first-come first-served basis, and a limited number are seen during each clinic.   [facility]  [address] Hillard Danker [place], [place] [phone]   Eligibility Requirements You must have lived in [place], [place], or [place] counties for at least the last three months.   You cannot be eligible for state or federal sponsored [employer], including [employer], [place], or [employer].   You generally cannot be eligible for healthcare insurance through your employer.    How to apply: Eligibility screenings are held every Tuesday and Wednesday afternoon from 1:00 pm until 4:00 pm. You do not need an appointment for the interview!  [facility] [address] [place], [place]   [place]  [place] Department  [place]  [phone]    Behavioral Health Resources in the Community: Intensive Outpatient Programs Organization         Address  Phone  Notes  [place] Nicholson. [address], [place], [place] [phone]   [facility] [address] [place], [place]   ADS: Alcohol & Drug Svcs  [address] [place], [place]   [place] [address]  [place], [place] or [phone]   Substance Abuse Resources Organization         Address  Phone  Notes  Alcohol and Drug Services  [phone]   [place]  [phone]   [facility] [place]   Chinita Pester  [phone]   Residential & Outpatient Substance Abuse Program  [phone]   Psychological Services Organization         Address  Phone  Notes  [facility] [place]  [place]  [phone]   [place] [address] [place] or [phone]    Mobile Crisis Teams Organization         Address  Phone  Notes  [facility]rapeutic Alternatives, Mobile Crisis Care Unit  [phone]   Assertive Psychotherapeutic Services  [address] [place], [place]   [place] Levels [address] [place] [phone]    Self-Help/Support Groups Organization         Address  Phone             Notes  [place]. of [place] - variety of support groups  [place] Call for more information  Narcotics Anonymous (NA), [facility] [address], [employer] [place]  2 meetings at this location   Special educational needs teacher         Address  Phone  Notes  ASAP Residential Treatment [place],    [place]  [phone]   [facility]  [address], [place] [address], [place], [place]   [place] [address], [place] [phone] Admissions: 8am-3pm M-F  Incentives Substance [place] [address]    [place], [place]   The [place]  [address] Jacinto Reap [place], [place]   [facility] [address],  [place], [place]   [facility] - Intensive Outpatient [address] Kristeen [address], [place], [place]   [facility] ([place].,  [place], [place] [phone] or [phone]   [facility]idential Treatment Services (RTS) [address] [place] [address] [place], [place] Accepts Medicaid  [facility] [place] [address],  [place] [phone] Substance Abuse/Addiction Treatment   [facility] Organization         Address  Phone  Notes  [facility]  [phone]   Domenic Schwab, PhD [address] [place]lis [place], [place]   [phone] or [phone]   [address] [facility] [facility] [place], [place] [phone]   [address], [place], [place] [phone] Insurance/Medicaid/sponsorship through [facility] and Families [address], [address]                                    [place], [place] [phone] [place] [address], [place] [phone]    Dr. Adele Schilder  [phone]   [facility] [place] Dept. 1) [address], [place] 2) [place] 3)  [place] [address], [place] [phone]  [phone]   [place] [phone] or [phone] (After Hours)

## 2014-10-24 NOTE — ED Provider Notes (Signed)
Patient was signed out to me by Antonietta Breach, PA-C   Patient is a 29 year old female 2 months postpartum C-section who was seen and evaluated in the ER today for right sided abdominal pain and chest pain. Workup in the ER was largely unremarkable including a negative CT abdomen pelvis, negative CT angiogram of chest for rule out of PE. Patient was being monitored and given fluid rehydration to monitor renal function after going through to contrast studies by CT. Plan signed out to me is to reevaluate and discharge after rehydration and if renal function is intact.   After aggressive rehydration, patient's creatinine and BUN are normal, patient states she is feeling better, requesting to eat and drink and requesting to go home. Patient hemodynamically stable at this point, and will be discharged with instruction to follow up with a primary care provider and provided with a resource guide to help her find one. Patient discharged with symptomatic therapy. Return precautions discussed, patient encouraged to call or return to the ER should she have any questions or concerns.  BP 119/86 mmHg  Pulse 100  Temp(Src) 98.2 F (36.8 C) (Oral)  Resp 20  SpO2 97%  LMP 12/15/2013 (Exact Date)  Signed,  Dahlia Bailiff, PA-C 8:38 AM    Carrie Mew, PA-C 46/96/29 5284  Delora Fuel, MD 13/24/40 1027  Delora Fuel, MD 25/36/64 4034

## 2014-10-24 NOTE — ED Notes (Signed)
Lab at the bedside 

## 2014-10-24 NOTE — ED Notes (Signed)
Patient transported to CT 

## 2014-10-24 NOTE — ED Provider Notes (Signed)
0200 - Patient care assumed from [facility], PA-C at shift change. Patient with CT pending for evaluation of abdominal pain. This was found to be normal; negative for appendicitis. Labs without leukocytosis. No fever today. BP stable. Concern is of tachycardia. HR 126 during my encounter in exam room. Will hydrate with 1L IVF and reassess. Patient has already received 500cc IVF.  0340 - Patient with persistent tachycardia despite IVF. Will continue to hydrate. Given recent pregnancy and C-section will add D dimer. Lactate also added. Patient reporting some mild pleuritic-type chest pain. She has been without tachypnea, dyspnea, or hypoxia while in ED. CT reviewed. Though timing of contrast is poor for CT angio; no saddle embolus visualized on CT abd/pelvis.  2957 - Patient seen by Dr. Roxanne Mins. He agrees tachycardia and pleuritic chest pain are concerning for PE in this patient's case. Have discussed dosing of contrast with Radiologist, Dr. Marisue Humble. Plan to continue IVF hydration. Will proceed with CT angio after patient has received another liter bolus. Will repeat kidney function after completion of CT angio in addition to rehydration following this study.  0600 - Patient returned from CT angio. Results pending. Rehydration initiated. Will continue to hydrate to preserve kidney function. Repeat BMP ordered. Patient signed out to Dahlia Bailiff, PA-C who will f/u on imaging and disposition appropriately. Anticipate d/c with pain medication and antiemetics if CT neg and repeat kidney function is reassuring.   Filed Vitals:   10/24/14 0300 10/24/14 0430 10/24/14 0500 10/24/14 0600  BP: 128/77 119/72 118/72 107/59  Pulse: 126 115 117 115  Temp:      TempSrc:      Resp:      SpO2: 96% 96% 98% 98%     Antonietta Breach, PA-C 47/34/03 [id number]  Delora Fuel, MD 43/83/81 [id number]

## 2014-10-24 NOTE — ED Notes (Signed)
Pt transported to radiology.

## 2014-10-30 ENCOUNTER — Encounter (HOSPITAL_COMMUNITY): Payer: Self-pay | Admitting: Nurse Practitioner

## 2014-10-30 ENCOUNTER — Emergency Department (HOSPITAL_COMMUNITY): Payer: Medicaid Other

## 2014-10-30 ENCOUNTER — Inpatient Hospital Stay (HOSPITAL_COMMUNITY)
Admission: EM | Admit: 2014-10-30 | Discharge: 2014-11-01 | DRG: 690 | Disposition: A | Discharge status: Home / Self Care | Payer: Medicaid Other | Attending: Internal Medicine | Admitting: Internal Medicine

## 2014-10-30 DIAGNOSIS — R109 Unspecified abdominal pain: Secondary | ICD-10-CM | POA: Diagnosis present

## 2014-10-30 DIAGNOSIS — R112 Nausea with vomiting, unspecified: Secondary | ICD-10-CM | POA: Diagnosis present

## 2014-10-30 DIAGNOSIS — R1011 Right upper quadrant pain: Secondary | ICD-10-CM | POA: Insufficient documentation

## 2014-10-30 DIAGNOSIS — J45909 Unspecified asthma, uncomplicated: Secondary | ICD-10-CM | POA: Diagnosis present

## 2014-10-30 DIAGNOSIS — R1110 Vomiting, unspecified: Secondary | ICD-10-CM

## 2014-10-30 DIAGNOSIS — R102 Pelvic and perineal pain: Secondary | ICD-10-CM

## 2014-10-30 DIAGNOSIS — N12 Tubulo-interstitial nephritis, not specified as acute or chronic: Principal | ICD-10-CM | POA: Diagnosis present

## 2014-10-30 LAB — CBC WITH DIFFERENTIAL/PLATELET
BASOS ABS: 0 10*3/uL (ref 0.0–0.1)
Basophils Relative: 0 % (ref 0–1)
EOS ABS: 0.1 10*3/uL (ref 0.0–0.7)
Eosinophils Relative: 1 % (ref 0–5)
HCT: 37.8 % (ref 36.0–46.0)
Hemoglobin: 12.2 g/dL (ref 12.0–15.0)
Lymphocytes Relative: 41 % (ref 12–46)
Lymphs Abs: 2.6 10*3/uL (ref 0.7–4.0)
MCH: 28.4 pg (ref 26.0–34.0)
MCHC: 32.3 g/dL (ref 30.0–36.0)
MCV: 88.1 fL (ref 78.0–100.0)
MONO ABS: 0.4 10*3/uL (ref 0.1–1.0)
Monocytes Relative: 5 % (ref 3–12)
NEUTROS ABS: 3.4 10*3/uL (ref 1.7–7.7)
Neutrophils Relative %: 53 % (ref 43–77)
Platelets: 329 10*3/uL (ref 150–400)
RBC: 4.29 MIL/uL (ref 3.87–5.11)
RDW: 13.6 % (ref 11.5–15.5)
WBC: 6.5 10*3/uL (ref 4.0–10.5)

## 2014-10-30 LAB — URINALYSIS, ROUTINE W REFLEX MICROSCOPIC
Bilirubin Urine: NEGATIVE
GLUCOSE, UA: NEGATIVE mg/dL
Hgb urine dipstick: NEGATIVE
Ketones, ur: NEGATIVE mg/dL
LEUKOCYTES UA: NEGATIVE
Nitrite: POSITIVE — AB
Protein, ur: NEGATIVE mg/dL
Specific Gravity, Urine: 1.02 (ref 1.005–1.030)
UROBILINOGEN UA: 0.2 mg/dL (ref 0.0–1.0)
pH: 6 (ref 5.0–8.0)

## 2014-10-30 LAB — COMPREHENSIVE METABOLIC PANEL
ALBUMIN: 3.9 g/dL (ref 3.5–5.2)
ALK PHOS: 115 U/L (ref 39–117)
ALT: 58 U/L — AB (ref 0–35)
ANION GAP: 6 (ref 5–15)
AST: 40 U/L — ABNORMAL HIGH (ref 0–37)
BUN: 9 mg/dL (ref 6–23)
CO2: 27 mmol/L (ref 19–32)
Calcium: 9.1 mg/dL (ref 8.4–10.5)
Chloride: 105 mmol/L (ref 96–112)
Creatinine, Ser: 0.71 mg/dL (ref 0.50–1.10)
GFR calc Af Amer: >90 mL/min (ref >90)
GFR calc non Af Amer: >90 mL/min (ref >90)
GLUCOSE: 110 mg/dL — AB (ref 70–99)
Potassium: 4.2 mmol/L (ref 3.5–5.1)
SODIUM: 138 mmol/L (ref 135–145)
Total Bilirubin: 0.3 mg/dL (ref 0.3–1.2)
Total Protein: 7.1 g/dL (ref 6.0–8.3)

## 2014-10-30 LAB — WET PREP, GENITAL
Clue Cells Wet Prep HPF POC: NONE SEEN
Trich, Wet Prep: NONE SEEN
WBC, Wet Prep HPF POC: NONE SEEN

## 2014-10-30 LAB — POC URINE PREG, ED: PREG TEST UR: NEGATIVE

## 2014-10-30 LAB — URINE MICROSCOPIC-ADD ON

## 2014-10-30 MED ORDER — OXYCODONE-ACETAMINOPHEN 5-325 MG PO TABS
1.0000 | ORAL_TABLET | Freq: Once | ORAL | Status: AC
  Administered 2014-10-30: 1 via ORAL
  Filled 2014-10-30: qty 1

## 2014-10-30 MED ORDER — CIPROFLOXACIN IN D5W 400 MG/200ML IV SOLN
400.0000 mg | Freq: Once | INTRAVENOUS | Status: AC
  Administered 2014-10-31: 400 mg via INTRAVENOUS
  Filled 2014-10-30: qty 200

## 2014-10-30 MED ORDER — ONDANSETRON HCL 4 MG/2ML IJ SOLN
4.0000 mg | Freq: Once | INTRAMUSCULAR | Status: AC
  Administered 2014-10-30: 4 mg via INTRAVENOUS
  Filled 2014-10-30: qty 2

## 2014-10-30 MED ORDER — IOHEXOL 300 MG/ML  SOLN
100.0000 mL | Freq: Once | INTRAMUSCULAR | Status: AC | PRN
  Administered 2014-10-30: 100 mL via INTRAVENOUS

## 2014-10-30 MED ORDER — HYDROMORPHONE HCL 1 MG/ML IJ SOLN
1.0000 mg | Freq: Once | INTRAMUSCULAR | Status: AC
  Administered 2014-10-30: 1 mg via INTRAVENOUS
  Filled 2014-10-30: qty 1

## 2014-10-30 MED ORDER — SODIUM CHLORIDE 0.9 % IV BOLUS (SEPSIS)
1000.0000 mL | Freq: Once | INTRAVENOUS | Status: AC
  Administered 2014-10-30: 1000 mL via INTRAVENOUS

## 2014-10-30 MED ORDER — MORPHINE SULFATE 4 MG/ML IJ SOLN
6.0000 mg | Freq: Once | INTRAMUSCULAR | Status: AC
  Administered 2014-10-30: 6 mg via INTRAVENOUS
  Filled 2014-10-30: qty 2

## 2014-10-30 MED ORDER — IOHEXOL 300 MG/ML  SOLN
25.0000 mL | Freq: Once | INTRAMUSCULAR | Status: AC | PRN
  Administered 2014-10-30: 25 mL via ORAL

## 2014-10-30 NOTE — ED Notes (Signed)
Beside preg result was negative

## 2014-10-30 NOTE — ED Provider Notes (Signed)
CSN: [id number]     Arrival date & time 10/30/14  1447 History   First MD Initiated Contact with Patient 10/30/14 1605     Chief Complaint  Patient presents with  . Abdominal Pain     (Consider location/radiation/quality/duration/timing/severity/associated sxs/prior Treatment) Patient is a 29 y.o. female presenting with abdominal pain. A language interpreter was used.  Abdominal Pain Patient presents to the emergency department with right lower abdominal pain.  The patient states has been ongoing for the past 8 days.  She states that she was seen in the emergency department 6 days ago for similar complaints.  Patient had a CT scan at that time did not show any abnormalities.  She was given IV fluids and was feeling some better.  Patient states she did not have any chest pain, shortness of breath, weakness, dizziness, headache, blurred vision, back pain, neck pain, dysuria, incontinence, diarrhea, fever, cough, runny nose sore throat, or syncope.  Patient states she did not take any medications prior to arrival.  Nothing seems make her condition better, but palpation makes the pain worse  Past Medical History  Diagnosis Date  . Diverticulitis   . Pyelonephritis    Past Surgical History  Procedure Laterality Date  . Cholecystectomy    . Cesarean section    . Cesarean section N/A 2006  . Cesarean section N/A 09/03/2014    Procedure: CESAREAN SECTION;  Surgeon: Guss Bunde, MD;  Location: [facility];  Service: Obstetrics;  Laterality: N/A;  . Tubal ligation     Family History  Problem Relation Age of Onset  . Hyperlipidemia Mother   . Diabetes Maternal Uncle   . Diabetes Paternal Grandmother    History  Substance Use Topics  . Smoking status: Never Smoker   . Smokeless tobacco: Never Used  . Alcohol Use: No   OB History    Gravida Para Term Preterm AB TAB SAB Ectopic Multiple Living   5 3 1 2 2  2  1 2      Review of Systems  Gastrointestinal: Positive for abdominal pain.    All other systems negative except as documented in the HPI. All pertinent positives and negatives as reviewed in the HPI.    Allergies  Review of patient's allergies indicates no known allergies.  Home Medications   Prior to Admission medications   Medication Sig Start Date End Date Taking? Authorizing Provider  acetaminophen (TYLENOL) 500 MG tablet Take 1,000 mg by mouth every 6 (six) hours as needed (pain).   Yes Historical Provider, MD  albuterol (PROVENTIL HFA;VENTOLIN HFA) 108 (90 BASE) MCG/ACT inhaler Inhale 4 puffs into the lungs every 6 (six) hours as needed for wheezing or shortness of breath. 08/13/14  Yes Cordelia Poche, MD  Prenatal Vit-Fe Fumarate-FA (MULTIVITAMIN-PRENATAL) 27-0.8 MG TABS tablet Take 1 tablet by mouth daily.    Yes Historical Provider, MD  PRESCRIPTION MEDICATION Take 1 tablet by mouth daily as needed (nausea/vomiting).   Yes Historical Provider, MD  benzonatate (TESSALON) 100 MG capsule Take 1 capsule (100 mg total) by mouth 3 (three) times daily as needed for cough. Patient not taking: Reported on 10/22/2014 08/23/14   Shelbie Hutching, MD  docusate sodium (COLACE) 100 MG capsule Take 1 capsule (100 mg total) by mouth 2 (two) times daily as needed. Patient not taking: Reported on 10/22/2014 09/06/14   Lattie Haw A Leftwich-Kirby, CNM  ibuprofen (ADVIL,MOTRIN) 600 MG tablet Take 1 tablet (600 mg total) by mouth every 6 (six) hours. Patient not taking: Reported  on 10/22/2014 09/06/14   Lattie Haw A Leftwich-Kirby, CNM  ondansetron (ZOFRAN) 4 MG tablet Take 1 tablet (4 mg total) by mouth every 8 (eight) hours as needed for nausea or vomiting. Patient not taking: Reported on 10/30/2014 10/24/14   Carrie Mew, PA-C  oxyCODONE-acetaminophen (PERCOCET) 5-325 MG per tablet Take 1-2 tablets by mouth every 6 (six) hours as needed. Patient not taking: Reported on 10/30/2014 10/24/14   Carrie Mew, PA-C  oxyCODONE-acetaminophen (PERCOCET/ROXICET) 5-325 MG per tablet Take 1-2 tablets by  mouth every 6 (six) hours as needed (for pain scale less than 7). Patient not taking: Reported on 10/22/2014 09/06/14   Kathie Dike Leftwich-Kirby, CNM  promethazine (PHENERGAN) 25 MG tablet Take 1 tablet (25 mg total) by mouth every 6 (six) hours as needed for nausea or vomiting. Patient not taking: Reported on 10/22/2014 08/23/14   Shelbie Hutching, MD   BP 115/96 mmHg  Pulse 76  Temp(Src) 98 F (36.7 C) (Oral)  Resp 18  SpO2 100%  LMP 12/15/2013 (Exact Date) Physical Exam  Constitutional: She is oriented to person, place, and time. She appears well-developed and well-nourished. No distress.  HENT:  Head: Normocephalic and atraumatic.  Mouth/Throat: Oropharynx is clear and moist.  Eyes: Pupils are equal, round, and reactive to light.  Neck: Normal range of motion. Neck supple.  Cardiovascular: Normal rate, regular rhythm and normal heart sounds.  Exam reveals no gallop and no friction rub.   No murmur heard. Pulmonary/Chest: Effort normal and breath sounds normal. No respiratory distress.  Abdominal: Soft. Normal appearance and bowel sounds are normal. She exhibits no distension. There is tenderness. There is no rebound, no guarding and no CVA tenderness. No hernia.    Genitourinary: Vagina normal and uterus normal. There is no tenderness or lesion on the right labia. There is no tenderness or lesion on the left labia. Cervix exhibits motion tenderness and discharge. Cervix exhibits no friability. Right adnexum displays tenderness. Left adnexum displays no tenderness. No erythema or tenderness in the vagina. No vaginal discharge found.  Neurological: She is alert and oriented to person, place, and time. She exhibits normal muscle tone. Coordination normal.  Skin: Skin is warm and dry. No rash noted. No erythema.  Nursing note and vitals reviewed.   ED Course  Procedures (including critical care time) Labs Review Labs Reviewed  COMPREHENSIVE METABOLIC PANEL - Abnormal; Notable for the  following:    Glucose, Bld 110 (*)    AST 40 (*)    ALT 58 (*)    All other components within normal limits  URINALYSIS, ROUTINE W REFLEX MICROSCOPIC - Abnormal; Notable for the following:    Nitrite POSITIVE (*)    All other components within normal limits  URINE MICROSCOPIC-ADD ON - Abnormal; Notable for the following:    Bacteria, UA MANY (*)    All other components within normal limits  WET PREP, GENITAL  CBC WITH DIFFERENTIAL/PLATELET  HIV ANTIBODY (ROUTINE TESTING)  POC URINE PREG, ED  GC/CHLAMYDIA PROBE AMP ([place])    Imaging Review US Transvaginal Non-ob  10/30/2014   CLINICAL DATA:  Acute onset of right lower quadrant abdominal pain for 2 days, with nausea and vomiting. Diarrhea, fever and chills. Two months postpartum by C-section. Initial encounter.  EXAM: TRANSABDOMINAL AND TRANSVAGINAL ULTRASOUND OF PELVIS  DOPPLER ULTRASOUND OF OVARIES  TECHNIQUE: Both transabdominal and transvaginal ultrasound examinations of the pelvis were performed. Transabdominal technique was performed for global imaging of the pelvis including uterus, ovaries, adnexal regions, and  pelvic cul-de-sac.  It was necessary to proceed with endovaginal exam following the transabdominal exam to visualize the uterus and ovaries in greater detail. Color and duplex Doppler ultrasound was utilized to evaluate blood flow to the ovaries.  COMPARISON:  Pelvic ultrasound performed 08/31/2014, and CT of the abdomen and pelvis performed earlier today at 6:25 p.m.  FINDINGS: Uterus  Measurements: 11.1 x 3.9 x 5.0 cm. No fibroids or suspicious mass visualized. Nabothian cysts are noted at the cervix.  Endometrium  Thickness: 1.3 cm. Minimal cystic foci are seen at the endometrial echo complex, nonspecific in appearance. Trace fluid is seen in the cervical canal.  Right ovary  Measurements: 3.5 x 2.3 x 3.4 cm. Normal appearance/no adnexal mass.  Left ovary  Measurements: 4.1 x 4.0 x 4.6 cm. There is a 3.7 x 2.8 x 3.4 cm  mostly anechoic cyst at the left ovary, with a 9 mm daughter cyst noted inside the cyst, demonstrating a thin stalk. Limited Doppler evaluation demonstrates no associated blood flow.  Pulsed Doppler evaluation of both ovaries demonstrates normal low-resistance arterial and venous waveforms.  Other findings  A small amount of free fluid is seen within the pelvis.  IMPRESSION: 1. 3.7 x 2.8 x 3.4 cm mostly anechoic cyst at the left ovary, with a 9 mm daughter cyst, demonstrating a thin stalk to the daughter cyst. This is most likely a simple cyst, though follow-up pelvic ultrasound is recommended in 2-3 months to ensure stability or resolution. 2. No evidence of ovarian torsion. 3. Minimal cystic foci at the endometrial echo complex are nonspecific and may be related to recent pregnancy. Uterus otherwise unremarkable.   Electronically Signed   By: Garald Balding M.D.   On: 10/30/2014 22:12   US Pelvis Complete  10/30/2014   CLINICAL DATA:  Acute onset of right lower quadrant abdominal pain for 2 days, with nausea and vomiting. Diarrhea, fever and chills. Two months postpartum by C-section. Initial encounter.  EXAM: TRANSABDOMINAL AND TRANSVAGINAL ULTRASOUND OF PELVIS  DOPPLER ULTRASOUND OF OVARIES  TECHNIQUE: Both transabdominal and transvaginal ultrasound examinations of the pelvis were performed. Transabdominal technique was performed for global imaging of the pelvis including uterus, ovaries, adnexal regions, and pelvic cul-de-sac.  It was necessary to proceed with endovaginal exam following the transabdominal exam to visualize the uterus and ovaries in greater detail. Color and duplex Doppler ultrasound was utilized to evaluate blood flow to the ovaries.  COMPARISON:  Pelvic ultrasound performed 08/31/2014, and CT of the abdomen and pelvis performed earlier today at 6:25 p.m.  FINDINGS: Uterus  Measurements: 11.1 x 3.9 x 5.0 cm. No fibroids or suspicious mass visualized. Nabothian cysts are noted at the cervix.   Endometrium  Thickness: 1.3 cm. Minimal cystic foci are seen at the endometrial echo complex, nonspecific in appearance. Trace fluid is seen in the cervical canal.  Right ovary  Measurements: 3.5 x 2.3 x 3.4 cm. Normal appearance/no adnexal mass.  Left ovary  Measurements: 4.1 x 4.0 x 4.6 cm. There is a 3.7 x 2.8 x 3.4 cm mostly anechoic cyst at the left ovary, with a 9 mm daughter cyst noted inside the cyst, demonstrating a thin stalk. Limited Doppler evaluation demonstrates no associated blood flow.  Pulsed Doppler evaluation of both ovaries demonstrates normal low-resistance arterial and venous waveforms.  Other findings  A small amount of free fluid is seen within the pelvis.  IMPRESSION: 1. 3.7 x 2.8 x 3.4 cm mostly anechoic cyst at the left ovary, with a 9 mm  daughter cyst, demonstrating a thin stalk to the daughter cyst. This is most likely a simple cyst, though follow-up pelvic ultrasound is recommended in 2-3 months to ensure stability or resolution. 2. No evidence of ovarian torsion. 3. Minimal cystic foci at the endometrial echo complex are nonspecific and may be related to recent pregnancy. Uterus otherwise unremarkable.   Electronically Signed   By: Garald Balding M.D.   On: 10/30/2014 22:12   Ct Abdomen Pelvis W Contrast  10/30/2014   CLINICAL DATA:  Right lower quadrant pain with nausea, vomiting, and fevers  EXAM: CT ABDOMEN AND PELVIS WITH CONTRAST  TECHNIQUE: Multidetector CT imaging of the abdomen and pelvis was performed using the standard protocol following bolus administration of intravenous contrast. Oral contrast was also administered.  CONTRAST:  159mL OMNIPAQUE IOHEXOL 300 MG/ML  SOLN  COMPARISON:  October 24, 2014  FINDINGS: Lung bases are clear.  Liver is enlarged, measuring 20.8 cm in length. No focal liver lesions are identified. Gallbladder is surgically absent. There is no appreciable biliary duct dilatation.  Spleen, pancreas, and adrenals appear normal. Kidneys bilaterally show  no mass or hydronephrosis on either side. There is no renal or ureteral calculus on either side.  In the pelvis, the urinary bladder is midline with normal wall thickness. There are tubal ligation clips bilaterally. There is a cyst in the left ovary measuring 3.3 x 3.3 cm, minimally smaller than on recent prior study. There is no new pelvic mass. There is no free pelvic fluid. Appendix appears normal. There is scarring in the anterior pelvic wall consistent with recent cesarian section. There are descending and sigmoid colon diverticula without diverticulitis.  There is no bowel obstruction. No free air or portal venous air. There is a small ventral hernia containing only fat.  There is no ascites, adenopathy, or abscess in the abdomen or pelvis. Aorta appears normal. There are no blastic or lytic bone lesions.  IMPRESSION: Enlarged liver without focal lesion.  Gallbladder absent.  Left ovarian cyst, slightly smaller compared to recent prior study.  Left colonic diverticula without diverticulitis.  Appendix appears normal. No bowel obstruction. No renal or ureteral calculus. No hydronephrosis. Small ventral hernia present containing only fat.   Electronically Signed   By: Lowella Grip III M.D.   On: 10/30/2014 18:35   [place] Art/ven Flow Abd Pelv Doppler  10/30/2014   CLINICAL DATA:  Acute onset of right lower quadrant abdominal pain for 2 days, with nausea and vomiting. Diarrhea, fever and chills. Two months postpartum by C-section. Initial encounter.  EXAM: TRANSABDOMINAL AND TRANSVAGINAL ULTRASOUND OF PELVIS  DOPPLER ULTRASOUND OF OVARIES  TECHNIQUE: Both transabdominal and transvaginal ultrasound examinations of the pelvis were performed. Transabdominal technique was performed for global imaging of the pelvis including uterus, ovaries, adnexal regions, and pelvic cul-de-sac.  It was necessary to proceed with endovaginal exam following the transabdominal exam to visualize the uterus and ovaries in greater  detail. Color and duplex Doppler ultrasound was utilized to evaluate blood flow to the ovaries.  COMPARISON:  Pelvic ultrasound performed 08/31/2014, and CT of the abdomen and pelvis performed earlier today at 6:25 p.m.  FINDINGS: Uterus  Measurements: 11.1 x 3.9 x 5.0 cm. No fibroids or suspicious mass visualized. Nabothian cysts are noted at the cervix.  Endometrium  Thickness: 1.3 cm. Minimal cystic foci are seen at the endometrial echo complex, nonspecific in appearance. Trace fluid is seen in the cervical canal.  Right ovary  Measurements: 3.5 x 2.3 x 3.4 cm. Normal  appearance/no adnexal mass.  Left ovary  Measurements: 4.1 x 4.0 x 4.6 cm. There is a 3.7 x 2.8 x 3.4 cm mostly anechoic cyst at the left ovary, with a 9 mm daughter cyst noted inside the cyst, demonstrating a thin stalk. Limited Doppler evaluation demonstrates no associated blood flow.  Pulsed Doppler evaluation of both ovaries demonstrates normal low-resistance arterial and venous waveforms.  Other findings  A small amount of free fluid is seen within the pelvis.  IMPRESSION: 1. 3.7 x 2.8 x 3.4 cm mostly anechoic cyst at the left ovary, with a 9 mm daughter cyst, demonstrating a thin stalk to the daughter cyst. This is most likely a simple cyst, though follow-up pelvic ultrasound is recommended in 2-3 months to ensure stability or resolution. 2. No evidence of ovarian torsion. 3. Minimal cystic foci at the endometrial echo complex are nonspecific and may be related to recent pregnancy. Uterus otherwise unremarkable.   Electronically Signed   By: Garald Balding M.D.   On: 10/30/2014 22:12   She will be treated for pyelonephritis.  Her CT scan evidence ultrasounds were negative.  The patient had adnexal tenderness on the right.  Patient be referred back to her GYN doctor, told to return here for any worsening in her condition.  Her vital signs have remained stable  MDM   Final diagnoses:  Pelvic pain in female        Brent General, PA-C 10/31/14 [place], MD 10/31/14 0500

## 2014-10-30 NOTE — ED Notes (Signed)
She c/o abd pain, n/v, fevers, body aches for past 8 days. She is 2 months postpartum

## 2014-10-31 ENCOUNTER — Encounter (HOSPITAL_COMMUNITY): Payer: Self-pay

## 2014-10-31 DIAGNOSIS — R112 Nausea with vomiting, unspecified: Secondary | ICD-10-CM | POA: Diagnosis present

## 2014-10-31 DIAGNOSIS — J4520 Mild intermittent asthma, uncomplicated: Secondary | ICD-10-CM

## 2014-10-31 DIAGNOSIS — N12 Tubulo-interstitial nephritis, not specified as acute or chronic: Principal | ICD-10-CM

## 2014-10-31 DIAGNOSIS — R1011 Right upper quadrant pain: Secondary | ICD-10-CM

## 2014-10-31 DIAGNOSIS — J45909 Unspecified asthma, uncomplicated: Secondary | ICD-10-CM | POA: Diagnosis present

## 2014-10-31 MED ORDER — MORPHINE SULFATE 2 MG/ML IJ SOLN
1.0000 mg | INTRAMUSCULAR | Status: DC | PRN
  Administered 2014-10-31 – 2014-11-01 (×5): 1 mg via INTRAVENOUS
  Filled 2014-10-31 (×5): qty 1

## 2014-10-31 MED ORDER — ACETAMINOPHEN 325 MG PO TABS: 650.0000 mg | ORAL_TABLET | Freq: Four times a day (QID) | ORAL | Status: DC | PRN

## 2014-10-31 MED ORDER — ONDANSETRON HCL 4 MG PO TABS: 4.0000 mg | ORAL_TABLET | Freq: Four times a day (QID) | ORAL | Status: DC | PRN

## 2014-10-31 MED ORDER — ONDANSETRON HCL 4 MG PO TABS: 4.0000 mg | ORAL_TABLET | Freq: Four times a day (QID) | ORAL | Status: DC

## 2014-10-31 MED ORDER — OXYCODONE-ACETAMINOPHEN 5-325 MG PO TABS: 1.0000 | ORAL_TABLET | Freq: Four times a day (QID) | ORAL | Status: DC | PRN

## 2014-10-31 MED ORDER — ALUM & MAG HYDROXIDE-SIMETH 200-200-20 MG/5ML PO SUSP: 30.0000 mL | Freq: Four times a day (QID) | ORAL | Status: DC | PRN

## 2014-10-31 MED ORDER — SODIUM CHLORIDE 0.9 % IV SOLN
INTRAVENOUS | Status: DC
  Administered 2014-10-31 – 2014-11-01 (×3): via INTRAVENOUS

## 2014-10-31 MED ORDER — CHLORHEXIDINE GLUCONATE 0.12 % MT SOLN
15.0000 mL | Freq: Two times a day (BID) | OROMUCOSAL | Status: DC
  Administered 2014-11-01: 15 mL via OROMUCOSAL
  Filled 2014-10-31: qty 15

## 2014-10-31 MED ORDER — ACETAMINOPHEN 650 MG RE SUPP: 650.0000 mg | Freq: Four times a day (QID) | RECTAL | Status: DC | PRN

## 2014-10-31 MED ORDER — OXYCODONE HCL 5 MG PO TABS
5.0000 mg | ORAL_TABLET | ORAL | Status: DC | PRN
  Administered 2014-11-01: 5 mg via ORAL
  Filled 2014-10-31 (×2): qty 1

## 2014-10-31 MED ORDER — DEXTROSE 5 % IV SOLN
1.0000 g | INTRAVENOUS | Status: DC
  Administered 2014-10-31 – 2014-11-01 (×2): 1 g via INTRAVENOUS
  Filled 2014-10-31 (×2): qty 10

## 2014-10-31 MED ORDER — SODIUM CHLORIDE 0.9 % IV BOLUS (SEPSIS)
1000.0000 mL | Freq: Once | INTRAVENOUS | Status: AC
  Administered 2014-10-31: 1000 mL via INTRAVENOUS

## 2014-10-31 MED ORDER — ONDANSETRON HCL 4 MG/2ML IJ SOLN
4.0000 mg | Freq: Four times a day (QID) | INTRAMUSCULAR | Status: DC | PRN
  Administered 2014-11-01: 4 mg via INTRAVENOUS
  Filled 2014-10-31: qty 2

## 2014-10-31 MED ORDER — SODIUM CHLORIDE 0.9 % IV BOLUS (SEPSIS)
500.0000 mL | Freq: Once | INTRAVENOUS | Status: AC
  Administered 2014-10-31: 500 mL via INTRAVENOUS

## 2014-10-31 MED ORDER — METOCLOPRAMIDE HCL 5 MG/ML IJ SOLN
10.0000 mg | Freq: Once | INTRAMUSCULAR | Status: AC
  Administered 2014-10-31: 10 mg via INTRAVENOUS
  Filled 2014-10-31: qty 2

## 2014-10-31 MED ORDER — ONDANSETRON HCL 4 MG/2ML IJ SOLN
4.0000 mg | Freq: Once | INTRAMUSCULAR | Status: AC
  Administered 2014-10-31: 4 mg via INTRAVENOUS
  Filled 2014-10-31: qty 2

## 2014-10-31 MED ORDER — CETYLPYRIDINIUM CHLORIDE 0.05 % MT LIQD
7.0000 mL | Freq: Two times a day (BID) | OROMUCOSAL | Status: DC
  Administered 2014-10-31: 7 mL via OROMUCOSAL

## 2014-10-31 MED ORDER — CIPROFLOXACIN HCL 500 MG PO TABS: 500.0000 mg | ORAL_TABLET | Freq: Two times a day (BID) | ORAL | Status: DC

## 2014-10-31 MED ORDER — PROMETHAZINE HCL 25 MG/ML IJ SOLN
12.5000 mg | Freq: Once | INTRAMUSCULAR | Status: AC
  Administered 2014-10-31: 12.5 mg via INTRAVENOUS
  Filled 2014-10-31: qty 1

## 2014-10-31 MED ORDER — FLUCONAZOLE IN SODIUM CHLORIDE 200-0.9 MG/100ML-% IV SOLN
200.0000 mg | Freq: Once | INTRAVENOUS | Status: AC
  Administered 2014-10-31: 200 mg via INTRAVENOUS
  Filled 2014-10-31: qty 100

## 2014-10-31 NOTE — Discharge Instructions (Signed)
Return here as needed.  Follow-up with your primary care doctor, increase your fluid intake and rest as much as possible °

## 2014-10-31 NOTE — Progress Notes (Signed)
UR completed 

## 2014-10-31 NOTE — Progress Notes (Signed)
I have seen and examined [name] at bedside and reviewed her chart in the presence of her husband. She was admitted earlier this morning by Dr. Arnoldo [name] for intractable nausea and vomiting and is being treated for suspected pyelonephritis not apparent on CT abdomen and pelvis. Of note is that patient also had wet prep positive for yeast. Will therefore give dose of fluconazole in addition to ceftriaxone/IV fluids initiated by Dr. Arnoldo [name]. If she continues to do well,  will likely discharge her home in a.m. Please refer to Dr. Arnoldo [name] comprehensive assessment and care plan.

## 2014-10-31 NOTE — ED Notes (Signed)
Breast pump placed at bedside and Maricela, RN, provided education.

## 2014-10-31 NOTE — ED Notes (Signed)
This nurse discussed pumping/expressing milk to maintain a good supply; pt declined at this time.

## 2014-10-31 NOTE — H&P (Addendum)
[facility] Admission History and Physical       [name] QMV:[id number] DOB: 1986-08-22 DOA: 10/30/2014  Referring physician: EDP  PCP: No PCP Per Patient  Specialists:   Chief Complaint:  ABD Pain Nausea and Vomiting  HPI: Lorraine [name] is a 29 y.o. female who presents to the ED with complaints of RLQ ABD pain with Nausea and Vomiting and fevers x 8 days.   She reports having a few loose stools as well.   She was evaluated in the ED and was found to have A +UA, and A CT scan of the ABD /Pelvis and Ultrasound Studies were also performed and revealed a physiologic ovarian cysts but no pathology.    A Urine Culture was sent and she was administered IV Cipro, however she required multiple doses of Anti-emetics with minimal relief and was retained for admission.      The patient is mainly spanish speaking and the Interpretor translation line was used to assist with the interview.   Of note the patient had a C-Section Delivery 2 months ago and  Breast feeds her baby.     Review of Systems:  Constitutional: No Weight Loss, No Weight Gain, Night Sweats, +Fevers, +Chills, Dizziness, Light Headedness, Fatigue, or Generalized Weakness HEENT: No Headaches, Difficulty Swallowing,Tooth/Dental Problems,Sore Throat,  No Sneezing, Rhinitis, Ear Ache, Nasal Congestion, or Post Nasal Drip,  Cardio-vascular:  No Chest pain, Orthopnea, PND, Edema in Lower Extremities, Anasarca, Dizziness, Palpitations  Resp: No Dyspnea, No DOE, No Productive Cough, No Non-Productive Cough, No Hemoptysis, No Wheezing.    GI: No Heartburn, Indigestion, +Abdominal Pain, +Nausea, +Vomiting, Diarrhea, Constipation, Hematemesis, Hematochezia, Melena, Change in Bowel Habits,  Loss of Appetite  GU: No Dysuria, No Change in Color of Urine, No Urgency or Urinary Frequency, No Flank pain.  Musculoskeletal: No Joint Pain or Swelling, No Decreased Range of Motion, No Back Pain.  Neurologic: No Syncope, No Seizures,  Muscle Weakness, Paresthesia, Vision Disturbance or Loss, No Diplopia, No Vertigo, No Difficulty Walking,  Skin: No Rash or Lesions. Psych: No Change in Mood or Affect, No Depression or Anxiety, No Memory loss, No Confusion, or Hallucinations   Past Medical History  Diagnosis Date  . Diverticulitis   . Pyelonephritis           Past Surgical History  Procedure Laterality Date  . Cholecystectomy    . Cesarean section    . Cesarean section N/A 2006  . Cesarean section N/A 09/03/2014    Procedure: CESAREAN SECTION;  Surgeon: Guss Bunde, MD;  Location: [facility];  Service: Obstetrics;  Laterality: N/A;  . Tubal ligation        Prior to Admission medications   Medication Sig Start Date End Date Taking? Authorizing Provider  acetaminophen (TYLENOL) 500 MG tablet Take 1,000 mg by mouth every 6 (six) hours as needed (pain).   Yes Historical Provider, MD  albuterol (PROVENTIL HFA;VENTOLIN HFA) 108 (90 BASE) MCG/ACT inhaler Inhale 4 puffs into the lungs every 6 (six) hours as needed for wheezing or shortness of breath. 08/13/14  Yes Cordelia Poche, MD  Prenatal Vit-Fe Fumarate-FA (MULTIVITAMIN-PRENATAL) 27-0.8 MG TABS tablet Take 1 tablet by mouth daily.    Yes Historical Provider, MD  PRESCRIPTION MEDICATION Take 1 tablet by mouth daily as needed (nausea/vomiting).   Yes Historical Provider, MD  benzonatate (TESSALON) 100 MG capsule Take 1 capsule (100 mg total) by mouth 3 (three) times daily as needed for cough. Patient not taking: Reported on 10/22/2014 08/23/14  Shelbie Hutching, MD  ciprofloxacin (CIPRO) 500 MG tablet Take 1 tablet (500 mg total) by mouth 2 (two) times daily. 10/31/14   Resa Miner Lawyer, PA-C  docusate sodium (COLACE) 100 MG capsule Take 1 capsule (100 mg total) by mouth 2 (two) times daily as needed. Patient not taking: Reported on 10/22/2014 09/06/14   Lattie Haw A Leftwich-Kirby, CNM  ibuprofen (ADVIL,MOTRIN) 600 MG tablet Take 1 tablet (600 mg total) by mouth every 6 (six)  hours. Patient not taking: Reported on 10/22/2014 09/06/14   Lattie Haw A Leftwich-Kirby, CNM  ondansetron (ZOFRAN) 4 MG tablet Take 1 tablet (4 mg total) by mouth every 6 (six) hours. 10/31/14   Brent General, PA-C  oxyCODONE-acetaminophen (PERCOCET/ROXICET) 5-325 MG per tablet Take 1 tablet by mouth every 6 (six) hours as needed for severe pain. 10/31/14   Resa Miner Lawyer, PA-C  promethazine (PHENERGAN) 25 MG tablet Take 1 tablet (25 mg total) by mouth every 6 (six) hours as needed for nausea or vomiting. Patient not taking: Reported on 10/22/2014 08/23/14   Shelbie Hutching, MD     No Known Allergies  Social History:  reports that she has never smoked. She has never used smokeless tobacco. She reports that she does not drink alcohol or use illicit drugs.    Family History  Problem Relation Age of Onset  . Hyperlipidemia Mother   . Diabetes Maternal Uncle   . Diabetes Paternal Grandmother        Physical Exam:  GEN:  Pleasant Obese  29 y.o. Hispanic female examined and in no acute distress; cooperative with exam Filed Vitals:   10/31/14 0341 10/31/14 0345 10/31/14 0415 10/31/14 0430  BP:  128/77 101/83 111/70  Pulse:  76 68 83  Temp: 98 F (36.7 C)     TempSrc: Oral     Resp:      SpO2:  100% 100% 100%   Blood pressure 111/70, pulse 83, temperature 98 F (36.7 C), temperature source Oral, resp. rate 17, last menstrual period 12/15/2013, SpO2 100 %, currently breastfeeding. PSYCH: She is alert and oriented x4; does not appear anxious does not appear depressed; affect is normal HEENT: Normocephalic and Atraumatic, Mucous membranes pink; PERRLA; EOM intact; Fundi:  Benign;  No scleral icterus, Nares: Patent, Oropharynx: Clear, Fair Dentition,    Neck:  FROM, No Cervical Lymphadenopathy nor Thyromegaly or Carotid Bruit; No JVD; Breasts:: Not examined CHEST WALL: No tenderness CHEST: Normal respiration, clear to auscultation bilaterally HEART: Regular rate and rhythm; no murmurs  rubs or gallops BACK: No kyphosis or scoliosis; No CVA tenderness ABDOMEN: Positive Bowel Sounds, Well  Healed C-Section Scar, Obese, Soft Non-Tender, No Rebound or Guarding; No Masses, No Organomegaly Rectal Exam: Not done EXTREMITIES: No Cyanosis, Clubbing, or Edema; No Ulcerations. Genitalia: not examined PULSES: 2+ and symmetric SKIN: Normal hydration no rash or ulceration CNS:  Alert and Oriented x 4, No Focal Deficits Vascular: pulses palpable throughout    Labs on Admission:  Basic Metabolic Panel:  Recent Labs Lab 10/30/14 1517  NA 138  K 4.2  CL 105  CO2 27  GLUCOSE 110*  BUN 9  CREATININE 0.71  CALCIUM 9.1   Liver Function Tests:  Recent Labs Lab 10/30/14 1517  AST 40*  ALT 58*  ALKPHOS 115  BILITOT 0.3  PROT 7.1  ALBUMIN 3.9   No results for input(s): LIPASE, AMYLASE in the last 168 hours. No results for input(s): AMMONIA in the last 168 hours. CBC:  Recent Labs Lab 10/30/14 1517  WBC 6.5  NEUTROABS 3.4  HGB 12.2  HCT 37.8  MCV 88.1  PLT 329   Cardiac Enzymes: No results for input(s): CKTOTAL, CKMB, CKMBINDEX, TROPONINI in the last 168 hours.  BNP (last 3 results) No results for input(s): BNP in the last 8760 hours.  ProBNP (last 3 results) No results for input(s): PROBNP in the last 8760 hours.  CBG: No results for input(s): GLUCAP in the last 168 hours.  Radiological Exams on Admission: US Transvaginal Non-ob  10/30/2014   CLINICAL DATA:  Acute onset of right lower quadrant abdominal pain for 2 days, with nausea and vomiting. Diarrhea, fever and chills. Two months postpartum by C-section. Initial encounter.  EXAM: TRANSABDOMINAL AND TRANSVAGINAL ULTRASOUND OF PELVIS  DOPPLER ULTRASOUND OF OVARIES  TECHNIQUE: Both transabdominal and transvaginal ultrasound examinations of the pelvis were performed. Transabdominal technique was performed for global imaging of the pelvis including uterus, ovaries, adnexal regions, and pelvic cul-de-sac.  It  was necessary to proceed with endovaginal exam following the transabdominal exam to visualize the uterus and ovaries in greater detail. Color and duplex Doppler ultrasound was utilized to evaluate blood flow to the ovaries.  COMPARISON:  Pelvic ultrasound performed 08/31/2014, and CT of the abdomen and pelvis performed earlier today at 6:25 p.m.  FINDINGS: Uterus  Measurements: 11.1 x 3.9 x 5.0 cm. No fibroids or suspicious mass visualized. Nabothian cysts are noted at the cervix.  Endometrium  Thickness: 1.3 cm. Minimal cystic foci are seen at the endometrial echo complex, nonspecific in appearance. Trace fluid is seen in the cervical canal.  Right ovary  Measurements: 3.5 x 2.3 x 3.4 cm. Normal appearance/no adnexal mass.  Left ovary  Measurements: 4.1 x 4.0 x 4.6 cm. There is a 3.7 x 2.8 x 3.4 cm mostly anechoic cyst at the left ovary, with a 9 mm daughter cyst noted inside the cyst, demonstrating a thin stalk. Limited Doppler evaluation demonstrates no associated blood flow.  Pulsed Doppler evaluation of both ovaries demonstrates normal low-resistance arterial and venous waveforms.  Other findings  A small amount of free fluid is seen within the pelvis.  IMPRESSION: 1. 3.7 x 2.8 x 3.4 cm mostly anechoic cyst at the left ovary, with a 9 mm daughter cyst, demonstrating a thin stalk to the daughter cyst. This is most likely a simple cyst, though follow-up pelvic ultrasound is recommended in 2-3 months to ensure stability or resolution. 2. No evidence of ovarian torsion. 3. Minimal cystic foci at the endometrial echo complex are nonspecific and may be related to recent pregnancy. Uterus otherwise unremarkable.   Electronically Signed   By: Garald Balding M.D.   On: 10/30/2014 22:12   US Pelvis Complete  10/30/2014   CLINICAL DATA:  Acute onset of right lower quadrant abdominal pain for 2 days, with nausea and vomiting. Diarrhea, fever and chills. Two months postpartum by C-section. Initial encounter.  EXAM:  TRANSABDOMINAL AND TRANSVAGINAL ULTRASOUND OF PELVIS  DOPPLER ULTRASOUND OF OVARIES  TECHNIQUE: Both transabdominal and transvaginal ultrasound examinations of the pelvis were performed. Transabdominal technique was performed for global imaging of the pelvis including uterus, ovaries, adnexal regions, and pelvic cul-de-sac.  It was necessary to proceed with endovaginal exam following the transabdominal exam to visualize the uterus and ovaries in greater detail. Color and duplex Doppler ultrasound was utilized to evaluate blood flow to the ovaries.  COMPARISON:  Pelvic ultrasound performed 08/31/2014, and CT of the abdomen and pelvis performed earlier today at 6:25 p.m.  FINDINGS: Uterus  Measurements:  11.1 x 3.9 x 5.0 cm. No fibroids or suspicious mass visualized. Nabothian cysts are noted at the cervix.  Endometrium  Thickness: 1.3 cm. Minimal cystic foci are seen at the endometrial echo complex, nonspecific in appearance. Trace fluid is seen in the cervical canal.  Right ovary  Measurements: 3.5 x 2.3 x 3.4 cm. Normal appearance/no adnexal mass.  Left ovary  Measurements: 4.1 x 4.0 x 4.6 cm. There is a 3.7 x 2.8 x 3.4 cm mostly anechoic cyst at the left ovary, with a 9 mm daughter cyst noted inside the cyst, demonstrating a thin stalk. Limited Doppler evaluation demonstrates no associated blood flow.  Pulsed Doppler evaluation of both ovaries demonstrates normal low-resistance arterial and venous waveforms.  Other findings  A small amount of free fluid is seen within the pelvis.  IMPRESSION: 1. 3.7 x 2.8 x 3.4 cm mostly anechoic cyst at the left ovary, with a 9 mm daughter cyst, demonstrating a thin stalk to the daughter cyst. This is most likely a simple cyst, though follow-up pelvic ultrasound is recommended in 2-3 months to ensure stability or resolution. 2. No evidence of ovarian torsion. 3. Minimal cystic foci at the endometrial echo complex are nonspecific and may be related to recent pregnancy. Uterus  otherwise unremarkable.   Electronically Signed   By: Garald Balding M.D.   On: 10/30/2014 22:12   Ct Abdomen Pelvis W Contrast  10/30/2014   CLINICAL DATA:  Right lower quadrant pain with nausea, vomiting, and fevers  EXAM: CT ABDOMEN AND PELVIS WITH CONTRAST  TECHNIQUE: Multidetector CT imaging of the abdomen and pelvis was performed using the standard protocol following bolus administration of intravenous contrast. Oral contrast was also administered.  CONTRAST:  1103mL OMNIPAQUE IOHEXOL 300 MG/ML  SOLN  COMPARISON:  October 24, 2014  FINDINGS: Lung bases are clear.  Liver is enlarged, measuring 20.8 cm in length. No focal liver lesions are identified. Gallbladder is surgically absent. There is no appreciable biliary duct dilatation.  Spleen, pancreas, and adrenals appear normal. Kidneys bilaterally show no mass or hydronephrosis on either side. There is no renal or ureteral calculus on either side.  In the pelvis, the urinary bladder is midline with normal wall thickness. There are tubal ligation clips bilaterally. There is a cyst in the left ovary measuring 3.3 x 3.3 cm, minimally smaller than on recent prior study. There is no new pelvic mass. There is no free pelvic fluid. Appendix appears normal. There is scarring in the anterior pelvic wall consistent with recent cesarian section. There are descending and sigmoid colon diverticula without diverticulitis.  There is no bowel obstruction. No free air or portal venous air. There is a small ventral hernia containing only fat.  There is no ascites, adenopathy, or abscess in the abdomen or pelvis. Aorta appears normal. There are no blastic or lytic bone lesions.  IMPRESSION: Enlarged liver without focal lesion.  Gallbladder absent.  Left ovarian cyst, slightly smaller compared to recent prior study.  Left colonic diverticula without diverticulitis.  Appendix appears normal. No bowel obstruction. No renal or ureteral calculus. No hydronephrosis. Small ventral  hernia present containing only fat.   Electronically Signed   By: Lowella Grip III M.D.   On: 10/30/2014 18:35   [place] Art/ven Flow Abd Pelv Doppler  10/30/2014   CLINICAL DATA:  Acute onset of right lower quadrant abdominal pain for 2 days, with nausea and vomiting. Diarrhea, fever and chills. Two months postpartum by C-section. Initial encounter.  EXAM: TRANSABDOMINAL AND  TRANSVAGINAL ULTRASOUND OF PELVIS  DOPPLER ULTRASOUND OF OVARIES  TECHNIQUE: Both transabdominal and transvaginal ultrasound examinations of the pelvis were performed. Transabdominal technique was performed for global imaging of the pelvis including uterus, ovaries, adnexal regions, and pelvic cul-de-sac.  It was necessary to proceed with endovaginal exam following the transabdominal exam to visualize the uterus and ovaries in greater detail. Color and duplex Doppler ultrasound was utilized to evaluate blood flow to the ovaries.  COMPARISON:  Pelvic ultrasound performed 08/31/2014, and CT of the abdomen and pelvis performed earlier today at 6:25 p.m.  FINDINGS: Uterus  Measurements: 11.1 x 3.9 x 5.0 cm. No fibroids or suspicious mass visualized. Nabothian cysts are noted at the cervix.  Endometrium  Thickness: 1.3 cm. Minimal cystic foci are seen at the endometrial echo complex, nonspecific in appearance. Trace fluid is seen in the cervical canal.  Right ovary  Measurements: 3.5 x 2.3 x 3.4 cm. Normal appearance/no adnexal mass.  Left ovary  Measurements: 4.1 x 4.0 x 4.6 cm. There is a 3.7 x 2.8 x 3.4 cm mostly anechoic cyst at the left ovary, with a 9 mm daughter cyst noted inside the cyst, demonstrating a thin stalk. Limited Doppler evaluation demonstrates no associated blood flow.  Pulsed Doppler evaluation of both ovaries demonstrates normal low-resistance arterial and venous waveforms.  Other findings  A small amount of free fluid is seen within the pelvis.  IMPRESSION: 1. 3.7 x 2.8 x 3.4 cm mostly anechoic cyst at the left ovary, with  a 9 mm daughter cyst, demonstrating a thin stalk to the daughter cyst. This is most likely a simple cyst, though follow-up pelvic ultrasound is recommended in 2-3 months to ensure stability or resolution. 2. No evidence of ovarian torsion. 3. Minimal cystic foci at the endometrial echo complex are nonspecific and may be related to recent pregnancy. Uterus otherwise unremarkable.   Electronically Signed   By: Garald Balding M.D.   On: 10/30/2014 22:12        Assessment/Plan:   29 y.o. female with  Principal Problem:   1.    Pyelonephritis   Urine C+S sent   IV Cipro x 1 given in ED    Changed to Rocephin since she is Breast Feeding      Active Problems:   2.    Nausea and vomiting   PRN IV Zofran     3.    Abdominal pain   PRN IV Morphine     4.    Asthma- in remission   Albuterol MDI QID PRN     5.    DVT Prophylaxis    SCDs      Code Status:   FULL CODE Family Communication:    Husband at Bedside Disposition Plan:    Inpatient     Time spent:  [place] [facility] Pager [phone]   If [place] Please Contact the Day Rounding Team MD for [facility]  If 7PM-7AM, Please Contact Night-Floor Coverage  [email or web address] Password [facility] 10/31/2014, 6:25 AM     ADDENDUM:   Patient was seen and examined on 10/31/2014

## 2014-10-31 NOTE — ED Notes (Signed)
This nurse discussed pumping/expressing milk to maintain good supply.  Pt declined at this time.

## 2014-11-01 LAB — CBC
HCT: 33.3 % — ABNORMAL LOW (ref 36.0–46.0)
HEMOGLOBIN: 11 g/dL — AB (ref 12.0–15.0)
MCH: 28.4 pg (ref 26.0–34.0)
MCHC: 33 g/dL (ref 30.0–36.0)
MCV: 85.8 fL (ref 78.0–100.0)
Platelets: 323 10*3/uL (ref 150–400)
RBC: 3.88 MIL/uL (ref 3.87–5.11)
RDW: 13.7 % (ref 11.5–15.5)
WBC: 5.8 10*3/uL (ref 4.0–10.5)

## 2014-11-01 LAB — BASIC METABOLIC PANEL
Anion gap: 8 (ref 5–15)
BUN: 6 mg/dL (ref 6–23)
CO2: 27 mmol/L (ref 19–32)
Calcium: 8.4 mg/dL (ref 8.4–10.5)
Chloride: 105 mmol/L (ref 96–112)
Creatinine, Ser: 0.69 mg/dL (ref 0.50–1.10)
Glucose, Bld: 84 mg/dL (ref 70–99)
Potassium: 3.2 mmol/L — ABNORMAL LOW (ref 3.5–5.1)
Sodium: 140 mmol/L (ref 135–145)

## 2014-11-01 LAB — HIV ANTIBODY (ROUTINE TESTING W REFLEX): HIV Screen 4th Generation wRfx: NONREACTIVE

## 2014-11-01 LAB — GC/CHLAMYDIA PROBE AMP (~~LOC~~) NOT AT ARMC
Chlamydia: NEGATIVE
Neisseria Gonorrhea: NEGATIVE

## 2014-11-01 MED ORDER — ACETAMINOPHEN 500 MG PO TABS: 500.0000 mg | ORAL_TABLET | Freq: Four times a day (QID) | ORAL | Status: DC | PRN

## 2014-11-01 MED ORDER — POTASSIUM CHLORIDE 20 MEQ/15ML (10%) PO SOLN
40.0000 meq | Freq: Once | ORAL | Status: AC
  Administered 2014-11-01: 40 meq via ORAL
  Filled 2014-11-01 (×2): qty 30

## 2014-11-01 MED ORDER — CIPROFLOXACIN HCL 500 MG PO TABS: 500.0000 mg | ORAL_TABLET | Freq: Two times a day (BID) | ORAL | Status: DC

## 2014-11-01 MED ORDER — ONDANSETRON HCL 4 MG PO TABS: 4.0000 mg | ORAL_TABLET | Freq: Four times a day (QID) | ORAL | Status: DC

## 2014-11-01 MED ORDER — OXYCODONE-ACETAMINOPHEN 5-325 MG PO TABS
1.0000 | ORAL_TABLET | Freq: Four times a day (QID) | ORAL | Status: DC | PRN
Start: 2014-11-01 — End: 2014-11-18

## 2014-11-01 NOTE — Discharge Summary (Signed)
Lorraine [name], is a 29 y.o. female  DOB 1986/02/05  MRN [id number].  Admission date:  10/30/2014  Admitting Physician  Theressa Millard, MD  Discharge Date:  11/01/2014   Primary MD  No PCP Per Patient  Recommendations for primary care physician for things to follow:  Please follow LFTs and complete resolution of abdominal pain with antibiotics.   Admission Diagnosis  Abdominal pain, right upper quadrant [R10.11] Pyelonephritis [N12] Pelvic pain in female [R10.2] Abdominal pain [R10.9] Intractable vomiting with nausea, vomiting of unspecified type [R11.10]   Discharge Diagnosis  Abdominal pain, right upper quadrant [R10.11] Pyelonephritis [N12] Pelvic pain in female [R10.2] Abdominal pain [R10.9] Intractable vomiting with nausea, vomiting of unspecified type [R11.10]   Lorraine [name] is a 29 year old female who had a vaginal delivery recently, and was admitted for intractable nausea and vomiting and was placed on antibiotics for suspected pyelonephritis not apparent on CT abdomen and pelvis. UA suggested UTI but rather unconvincingly. Of note is that patient also had wet prep positive for yeast and she therefore received dose of fluconazole in addition to ceftriaxone/IV fluids. She is tolerating food and reports the pain is better and vomiting resolved. Will therefore discharge her on Ciprofloxacin to complete 5 more days. She should follow with ObGyn as scheduled.   Discharge Condition Stable.  Consults obtained  None  Follow UP Follow-up Information    Follow up with [facility].   Specialty:  Emergency Medicine   Contact information:   [address] [id number] [place] [phone]        Discharge Instructions  and  Discharge Medications  Discharge Instructions    Diet - low sodium heart healthy    Complete by:  As directed      Increase activity slowly    Complete by:  As directed             Medication List    STOP taking these medications        PRESCRIPTION MEDICATION     promethazine 25 MG tablet  Commonly known as:  PHENERGAN      TAKE these medications        acetaminophen 500 MG tablet  Commonly known as:  TYLENOL  Take 1 tablet (500 mg total) by mouth every 6 (six) hours as needed (pain).     albuterol 108 (90 BASE) MCG/ACT inhaler  Commonly known as:  PROVENTIL HFA;VENTOLIN HFA  Inhale 4 puffs into the lungs every 6 (six) hours as needed for wheezing or shortness of breath.     benzonatate 100 MG capsule  Commonly known as:  TESSALON  Take 1 capsule (100 mg total) by mouth 3 (three) times daily as needed for cough.     ciprofloxacin 500 MG tablet  Commonly known as:  CIPRO  Take 1 tablet (500 mg total) by mouth 2 (two) times daily.     docusate sodium 100 MG  capsule  Commonly known as:  COLACE  Take 1 capsule (100 mg total) by mouth 2 (two) times daily as needed.     ibuprofen 600 MG tablet  Commonly known as:  ADVIL,MOTRIN  Take 1 tablet (600 mg total) by mouth every 6 (six) hours.     multivitamin-prenatal 27-0.8 MG Tabs tablet  Take 1 tablet by mouth daily.     ondansetron 4 MG tablet  Commonly known as:  ZOFRAN  Take 1 tablet (4 mg total) by mouth every 6 (six) hours.     oxyCODONE-acetaminophen 5-325 MG per tablet  Commonly known as:  PERCOCET/ROXICET  Take 1 tablet by mouth every 6 (six) hours as needed for severe pain.        Diet and Activity recommendation: See Discharge Instructions above  Major procedures and Radiology Reports - PLEASE review detailed and final reports for all details, in brief -    Ct Angio Chest Pe W/cm &/or Wo Cm  10/24/2014   CLINICAL DATA:  Tachycardia. Recent postpartum. Concern for  pulmonary embolus.  EXAM: CT ANGIOGRAPHY CHEST WITH CONTRAST  TECHNIQUE: Multidetector CT imaging of the chest was performed using the standard protocol during bolus administration of intravenous contrast. Multiplanar CT image reconstructions and MIPs were obtained to evaluate the vascular anatomy.  CONTRAST:  36mL OMNIPAQUE IOHEXOL 350 MG/ML SOLN  COMPARISON:  None.  FINDINGS: There are no filling defects within the pulmonary arteries to suggest pulmonary embolus. The thoracic aorta is normal in caliber without dissection. The heart size is normal. There is no pleural or pericardial effusion. No mediastinal or hilar adenopathy.  There are hypoventilatory changes at the lung bases. Punctate calcified granuloma in the superior segment left lower lobe. No consolidation, pulmonary nodule or mass.  Upper abdomen evaluated earlier this day by CT, no acute abnormality.  No osseous abnormalities.  Review of the MIP images confirms the above findings.  IMPRESSION: 1. No pulmonary embolus. 2. Hypoventilatory change, no acute intrathoracic process. Punctate calcified granuloma in the left lung.   Electronically Signed   By: Jeb Levering M.D.   On: 10/24/2014 06:04   US Transvaginal Non-ob  10/30/2014   CLINICAL DATA:  Acute onset of right lower quadrant abdominal pain for 2 days, with nausea and vomiting. Diarrhea, fever and chills. Two months postpartum by C-section. Initial encounter.  EXAM: TRANSABDOMINAL AND TRANSVAGINAL ULTRASOUND OF PELVIS  DOPPLER ULTRASOUND OF OVARIES  TECHNIQUE: Both transabdominal and transvaginal ultrasound examinations of the pelvis were performed. Transabdominal technique was performed for global imaging of the pelvis including uterus, ovaries, adnexal regions, and pelvic cul-de-sac.  It was necessary to proceed with endovaginal exam following the transabdominal exam to visualize the uterus and ovaries in greater detail. Color and duplex Doppler ultrasound was utilized to evaluate blood  flow to the ovaries.  COMPARISON:  Pelvic ultrasound performed 08/31/2014, and CT of the abdomen and pelvis performed earlier today at 6:25 p.m.  FINDINGS: Uterus  Measurements: 11.1 x 3.9 x 5.0 cm. No fibroids or suspicious mass visualized. Nabothian cysts are noted at the cervix.  Endometrium  Thickness: 1.3 cm. Minimal cystic foci are seen at the endometrial echo complex, nonspecific in appearance. Trace fluid is seen in the cervical canal.  Right ovary  Measurements: 3.5 x 2.3 x 3.4 cm. Normal appearance/no adnexal mass.  Left ovary  Measurements: 4.1 x 4.0 x 4.6 cm. There is a 3.7 x 2.8 x 3.4 cm mostly anechoic cyst at the left ovary, with a 9 mm daughter  cyst noted inside the cyst, demonstrating a thin stalk. Limited Doppler evaluation demonstrates no associated blood flow.  Pulsed Doppler evaluation of both ovaries demonstrates normal low-resistance arterial and venous waveforms.  Other findings  A small amount of free fluid is seen within the pelvis.  IMPRESSION: 1. 3.7 x 2.8 x 3.4 cm mostly anechoic cyst at the left ovary, with a 9 mm daughter cyst, demonstrating a thin stalk to the daughter cyst. This is most likely a simple cyst, though follow-up pelvic ultrasound is recommended in 2-3 months to ensure stability or resolution. 2. No evidence of ovarian torsion. 3. Minimal cystic foci at the endometrial echo complex are nonspecific and may be related to recent pregnancy. Uterus otherwise unremarkable.   Electronically Signed   By: Garald Balding M.D.   On: 10/30/2014 22:12   US Pelvis Complete  10/30/2014   CLINICAL DATA:  Acute onset of right lower quadrant abdominal pain for 2 days, with nausea and vomiting. Diarrhea, fever and chills. Two months postpartum by C-section. Initial encounter.  EXAM: TRANSABDOMINAL AND TRANSVAGINAL ULTRASOUND OF PELVIS  DOPPLER ULTRASOUND OF OVARIES  TECHNIQUE: Both transabdominal and transvaginal ultrasound examinations of the pelvis were performed. Transabdominal  technique was performed for global imaging of the pelvis including uterus, ovaries, adnexal regions, and pelvic cul-de-sac.  It was necessary to proceed with endovaginal exam following the transabdominal exam to visualize the uterus and ovaries in greater detail. Color and duplex Doppler ultrasound was utilized to evaluate blood flow to the ovaries.  COMPARISON:  Pelvic ultrasound performed 08/31/2014, and CT of the abdomen and pelvis performed earlier today at 6:25 p.m.  FINDINGS: Uterus  Measurements: 11.1 x 3.9 x 5.0 cm. No fibroids or suspicious mass visualized. Nabothian cysts are noted at the cervix.  Endometrium  Thickness: 1.3 cm. Minimal cystic foci are seen at the endometrial echo complex, nonspecific in appearance. Trace fluid is seen in the cervical canal.  Right ovary  Measurements: 3.5 x 2.3 x 3.4 cm. Normal appearance/no adnexal mass.  Left ovary  Measurements: 4.1 x 4.0 x 4.6 cm. There is a 3.7 x 2.8 x 3.4 cm mostly anechoic cyst at the left ovary, with a 9 mm daughter cyst noted inside the cyst, demonstrating a thin stalk. Limited Doppler evaluation demonstrates no associated blood flow.  Pulsed Doppler evaluation of both ovaries demonstrates normal low-resistance arterial and venous waveforms.  Other findings  A small amount of free fluid is seen within the pelvis.  IMPRESSION: 1. 3.7 x 2.8 x 3.4 cm mostly anechoic cyst at the left ovary, with a 9 mm daughter cyst, demonstrating a thin stalk to the daughter cyst. This is most likely a simple cyst, though follow-up pelvic ultrasound is recommended in 2-3 months to ensure stability or resolution. 2. No evidence of ovarian torsion. 3. Minimal cystic foci at the endometrial echo complex are nonspecific and may be related to recent pregnancy. Uterus otherwise unremarkable.   Electronically Signed   By: Garald Balding M.D.   On: 10/30/2014 22:12   Ct Abdomen Pelvis W Contrast  10/30/2014   CLINICAL DATA:  Right lower quadrant pain with nausea,  vomiting, and fevers  EXAM: CT ABDOMEN AND PELVIS WITH CONTRAST  TECHNIQUE: Multidetector CT imaging of the abdomen and pelvis was performed using the standard protocol following bolus administration of intravenous contrast. Oral contrast was also administered.  CONTRAST:  17mL OMNIPAQUE IOHEXOL 300 MG/ML  SOLN  COMPARISON:  October 24, 2014  FINDINGS: Lung bases are clear.  Liver  is enlarged, measuring 20.8 cm in length. No focal liver lesions are identified. Gallbladder is surgically absent. There is no appreciable biliary duct dilatation.  Spleen, pancreas, and adrenals appear normal. Kidneys bilaterally show no mass or hydronephrosis on either side. There is no renal or ureteral calculus on either side.  In the pelvis, the urinary bladder is midline with normal wall thickness. There are tubal ligation clips bilaterally. There is a cyst in the left ovary measuring 3.3 x 3.3 cm, minimally smaller than on recent prior study. There is no new pelvic mass. There is no free pelvic fluid. Appendix appears normal. There is scarring in the anterior pelvic wall consistent with recent cesarian section. There are descending and sigmoid colon diverticula without diverticulitis.  There is no bowel obstruction. No free air or portal venous air. There is a small ventral hernia containing only fat.  There is no ascites, adenopathy, or abscess in the abdomen or pelvis. Aorta appears normal. There are no blastic or lytic bone lesions.  IMPRESSION: Enlarged liver without focal lesion.  Gallbladder absent.  Left ovarian cyst, slightly smaller compared to recent prior study.  Left colonic diverticula without diverticulitis.  Appendix appears normal. No bowel obstruction. No renal or ureteral calculus. No hydronephrosis. Small ventral hernia present containing only fat.   Electronically Signed   By: Lowella Grip III M.D.   On: 10/30/2014 18:35   Ct Abdomen Pelvis W Contrast  10/24/2014   CLINICAL DATA:  Right upper and lower  quadrant abdominal pain. Vomiting, chills, body aches. Recent Caesarean section.  EXAM: CT ABDOMEN AND PELVIS WITH CONTRAST  TECHNIQUE: Multidetector CT imaging of the abdomen and pelvis was performed using the standard protocol following bolus administration of intravenous contrast.  CONTRAST:  82mL OMNIPAQUE IOHEXOL 300 MG/ML SOLN, 170mL OMNIPAQUE IOHEXOL 300 MG/ML SOLN  COMPARISON:  None.  FINDINGS: Mild motion artifact in the lung bases. Punctate granuloma in the left lower lobe.  Clips in the gallbladder fossa from cholecystectomy. The liver, spleen, adrenal glands, and pancreas are normal. There is no biliary dilatation. Kidneys demonstrate symmetric enhancement without hydronephrosis or perinephric stranding. No localizing renal abnormality.  There are no dilated or thickened bowel loops. Terminal ileum is normal. The appendix is normal. There scattered colonic diverticula involving the descending colon without diverticulitis. No free air, free fluid, or intra-abdominal fluid collection. Tiny uncomplicated fat containing umbilical hernia.  Within the pelvis the urinary bladder is minimally distended. Uterus is at the upper limits of normal in size, tubal ligation clips are seen. There is a 3.7 cm cyst in the left ovary. The right ovary is normal. There is no pelvic free fluid. No pelvic fluid collection. No evidence pelvic inflammation. Postsurgical change in the lower anterior abdominal wall post Cesarean section. There is no subcutaneous fluid collection.  There are no acute or suspicious osseous abnormalities. Punctate bone island in the left acetabulum.  IMPRESSION: 1. No acute abnormality in the abdomen/pelvis. 2. Mild diverticulosis in the descending colon without diverticulitis. 3. Post cholecystectomy.  No biliary dilatation. 4. Left ovarian cyst.   Electronically Signed   By: Jeb Levering M.D.   On: 10/24/2014 01:35   [place] Art/ven Flow Abd Pelv Doppler  10/30/2014   CLINICAL DATA:  Acute onset  of right lower quadrant abdominal pain for 2 days, with nausea and vomiting. Diarrhea, fever and chills. Two months postpartum by C-section. Initial encounter.  EXAM: TRANSABDOMINAL AND TRANSVAGINAL ULTRASOUND OF PELVIS  DOPPLER ULTRASOUND OF OVARIES  TECHNIQUE: Both transabdominal  and transvaginal ultrasound examinations of the pelvis were performed. Transabdominal technique was performed for global imaging of the pelvis including uterus, ovaries, adnexal regions, and pelvic cul-de-sac.  It was necessary to proceed with endovaginal exam following the transabdominal exam to visualize the uterus and ovaries in greater detail. Color and duplex Doppler ultrasound was utilized to evaluate blood flow to the ovaries.  COMPARISON:  Pelvic ultrasound performed 08/31/2014, and CT of the abdomen and pelvis performed earlier today at 6:25 p.m.  FINDINGS: Uterus  Measurements: 11.1 x 3.9 x 5.0 cm. No fibroids or suspicious mass visualized. Nabothian cysts are noted at the cervix.  Endometrium  Thickness: 1.3 cm. Minimal cystic foci are seen at the endometrial echo complex, nonspecific in appearance. Trace fluid is seen in the cervical canal.  Right ovary  Measurements: 3.5 x 2.3 x 3.4 cm. Normal appearance/no adnexal mass.  Left ovary  Measurements: 4.1 x 4.0 x 4.6 cm. There is a 3.7 x 2.8 x 3.4 cm mostly anechoic cyst at the left ovary, with a 9 mm daughter cyst noted inside the cyst, demonstrating a thin stalk. Limited Doppler evaluation demonstrates no associated blood flow.  Pulsed Doppler evaluation of both ovaries demonstrates normal low-resistance arterial and venous waveforms.  Other findings  A small amount of free fluid is seen within the pelvis.  IMPRESSION: 1. 3.7 x 2.8 x 3.4 cm mostly anechoic cyst at the left ovary, with a 9 mm daughter cyst, demonstrating a thin stalk to the daughter cyst. This is most likely a simple cyst, though follow-up pelvic ultrasound is recommended in 2-3 months to ensure stability or  resolution. 2. No evidence of ovarian torsion. 3. Minimal cystic foci at the endometrial echo complex are nonspecific and may be related to recent pregnancy. Uterus otherwise unremarkable.   Electronically Signed   By: Garald Balding M.D.   On: 10/30/2014 22:12    Micro Results   Recent Results (from the past 240 hour(s))  Urine culture     Status: None (Preliminary result)   Collection Time: 10/30/14  2:56 PM  Result Value Ref Range Status   Specimen Description URINE, CATHETERIZED  Final   Special Requests ADDED 0414 10/31/14  Final   Colony Count   Final    >=100,000 COLONIES/ML Performed at [facility]    Culture   Final    ESCHERICHIA COLI Performed at [facility]    Report Status PENDING  Incomplete  Wet prep, genital     Status: Abnormal   Collection Time: 10/30/14 10:41 PM  Result Value Ref Range Status   Yeast Wet Prep HPF POC FEW (A) NONE SEEN Final   Trich, Wet Prep NONE SEEN NONE SEEN Final   Clue Cells Wet Prep HPF POC NONE SEEN NONE SEEN Final   WBC, Wet Prep HPF POC NONE SEEN NONE SEEN Final       Today   Subjective:   Lorraine [name] today has no headache,no chest abdominal pain,no new weakness tingling or numbness, feels much better wants to go home today.   Objective:   Blood pressure 122/66, pulse 67, temperature 98.6 F (37 C), temperature source Oral, resp. rate 16, height 5' (1.524 m), weight 88.678 kg (195 lb 8 oz), last menstrual period 12/15/2013, SpO2 100 %, currently breastfeeding.   Intake/Output Summary (Last 24 hours) at 11/01/14 1604 Last data filed at 11/01/14 1359  Gross per 24 hour  Intake    462 ml  Output  0 ml  Net    462 ml    Exam Awake Alert, Oriented x 3, No new F.N deficits, Normal affect [place].AT,PERRAL Supple Neck,No JVD, No cervical lymphadenopathy appriciated.  Symmetrical Chest wall movement, Good air movement bilaterally, CTAB RRR,No Gallops,Rubs or new Murmurs, No Parasternal Heave +ve  B.Sounds, Abd Soft, Non tender, No organomegaly appriciated, No rebound -guarding or rigidity. No Cyanosis, Clubbing or edema, No new Rash or bruise  Data Review   CBC w Diff: Lab Results  Component Value Date   WBC 5.8 11/01/2014   HGB 11.0* 11/01/2014   HCT 33.3* 11/01/2014   PLT 323 11/01/2014   LYMPHOPCT 41 10/30/2014   BANDSPCT 0 03/30/2009   MONOPCT 5 10/30/2014   EOSPCT 1 10/30/2014   BASOPCT 0 10/30/2014    CMP: Lab Results  Component Value Date   NA 140 11/01/2014   K 3.2* 11/01/2014   CL 105 11/01/2014   CO2 27 11/01/2014   BUN 6 11/01/2014   CREATININE 0.69 11/01/2014   PROT 7.1 10/30/2014   ALBUMIN 3.9 10/30/2014   BILITOT 0.3 10/30/2014   ALKPHOS 115 10/30/2014   AST 40* 10/30/2014   ALT 58* 10/30/2014  .   Total Time in preparing paper work, data evaluation and todays exam - 25 minutes  Sherissa Tenenbaum M.D on 11/01/2014 at 4:04 PM  [facility] Office  [phone]

## 2014-11-02 LAB — URINE CULTURE

## 2014-11-04 ENCOUNTER — Emergency Department (INDEPENDENT_AMBULATORY_CARE_PROVIDER_SITE_OTHER)
Admission: EM | Admit: 2014-11-04 | Discharge: 2014-11-04 | Disposition: A | Discharge status: Home / Self Care | Payer: Self-pay | Source: Home / Self Care | Attending: Family Medicine | Admitting: Family Medicine

## 2014-11-04 ENCOUNTER — Encounter (HOSPITAL_COMMUNITY): Payer: Self-pay | Admitting: *Deleted

## 2014-11-04 DIAGNOSIS — R1031 Right lower quadrant pain: Secondary | ICD-10-CM

## 2014-11-04 LAB — OCCULT BLOOD, POC DEVICE: FECAL OCCULT BLD: POSITIVE — AB

## 2014-11-04 LAB — POCT URINALYSIS DIP (DEVICE)
Bilirubin Urine: NEGATIVE
GLUCOSE, UA: NEGATIVE mg/dL
Hgb urine dipstick: NEGATIVE
Ketones, ur: NEGATIVE mg/dL
Leukocytes, UA: NEGATIVE
NITRITE: NEGATIVE
Protein, ur: NEGATIVE mg/dL
SPECIFIC GRAVITY, URINE: 1.015 (ref 1.005–1.030)
UROBILINOGEN UA: 0.2 mg/dL (ref 0.0–1.0)
pH: 6.5 (ref 5.0–8.0)

## 2014-11-04 LAB — POCT PREGNANCY, URINE: Preg Test, Ur: NEGATIVE

## 2014-11-04 MED ORDER — POLYETHYLENE GLYCOL 3350 17 GM/SCOOP PO POWD: 17.0000 g | Freq: Every day | ORAL | Status: DC

## 2014-11-04 NOTE — ED Provider Notes (Signed)
Lorraine [name] is a 29 y.o. female who presents to Urgent Care today for Abdominal pain. Patient was discharged from the hospital on February 15 after diagnosis of pyelonephritis. Since discharge she is continued to experience abdominal pain. The pain is right lower quadrant. No fevers or chills vomiting or diarrhea. Patient is having normal bowel movements. She had one episode of bright red blood per rectum 2 days ago. She has finished her Cipro for pyelonephritis.   Past Medical History  Diagnosis Date  . Diverticulitis   . Pyelonephritis    Past Surgical History  Procedure Laterality Date  . Cholecystectomy    . Cesarean section    . Cesarean section N/A 2006  . Cesarean section N/A 09/03/2014    Procedure: CESAREAN SECTION;  Surgeon: Guss Bunde, MD;  Location: [facility];  Service: Obstetrics;  Laterality: N/A;  . Tubal ligation     History  Substance Use Topics  . Smoking status: Never Smoker   . Smokeless tobacco: Never Used  . Alcohol Use: No   ROS as above Medications: No current facility-administered medications for this encounter.   Current Outpatient Prescriptions  Medication Sig Dispense Refill  . acetaminophen (TYLENOL) 500 MG tablet Take 1 tablet (500 mg total) by mouth every 6 (six) hours as needed (pain). 30 tablet 0  . albuterol (PROVENTIL HFA;VENTOLIN HFA) 108 (90 BASE) MCG/ACT inhaler Inhale 4 puffs into the lungs every 6 (six) hours as needed for wheezing or shortness of breath. 1 Inhaler 2  . ibuprofen (ADVIL,MOTRIN) 600 MG tablet Take 1 tablet (600 mg total) by mouth every 6 (six) hours. 30 tablet 0  . oxyCODONE-acetaminophen (PERCOCET/ROXICET) 5-325 MG per tablet Take 1 tablet by mouth every 6 (six) hours as needed for severe pain. 15 tablet 0  . Prenatal Vit-Fe Fumarate-FA (MULTIVITAMIN-PRENATAL) 27-0.8 MG TABS tablet Take 1 tablet by mouth daily.     . polyethylene glycol powder (GLYCOLAX/MIRALAX) powder Take 17 g by mouth daily. 850 g 1   No Known  Allergies   Exam:  BP 123/84 mmHg  Pulse 66  Temp(Src) 98.1 F (36.7 C) (Oral)  Resp 18  SpO2 98%  LMP 12/15/2013 (Exact Date)  Breastfeeding? Yes Gen: Well NAD HEENT: EOMI,  MMM Lungs: Normal work of breathing. CTABL Heart: RRR no MRG Abd: NABS, Soft. Nondistended, palpation right lower quadrant without rebound or guarding. No masses palpated. Nontender otherwise. No CV angle tenderness to percussion Exts: Brisk capillary refill, warm and well perfused.  Rectal: No external hemorrhoids normal-appearing anus. Rectal exam with firm stool in the vault. No fissures or internal hemorrhoids noted.  \  Point-of-care Hemoccult positive  Results for orders placed or performed during the hospital encounter of 11/04/14 (from the past 24 hour(s))  POCT urinalysis dip (device)     Status: None   Collection Time: 11/04/14  5:48 PM  Result Value Ref Range   Glucose, UA NEGATIVE NEGATIVE mg/dL   Bilirubin Urine NEGATIVE NEGATIVE   Ketones, ur NEGATIVE NEGATIVE mg/dL   Specific Gravity, Urine 1.015 1.005 - 1.030   Hgb urine dipstick NEGATIVE NEGATIVE   pH 6.5 5.0 - 8.0   Protein, ur NEGATIVE NEGATIVE mg/dL   Urobilinogen, UA 0.2 0.0 - 1.0 mg/dL   Nitrite NEGATIVE NEGATIVE   Leukocytes, UA NEGATIVE NEGATIVE  Pregnancy, urine POC     Status: None   Collection Time: 11/04/14  5:53 PM  Result Value Ref Range   Preg Test, Ur NEGATIVE NEGATIVE   No results found.  Assessment and Plan: 29 y.o. female with abdominal pain. Unclear etiology. Patient had extensive workup recently which does not explain the cause of her current pain. Plan to treat with miralax for probable constipation based on rectal exam findings. Follow-up with PCP. Return to emergency room as needed  Discussed warning signs or symptoms. Please see discharge instructions. Patient expresses understanding.     Gregor Hams, MD 11/04/14 [phone]

## 2014-11-04 NOTE — ED Notes (Signed)
D/C from the hospital 2/15. C/o abdominal pain.  Recently delivered and is doing breast and bottle feeding.  No vaginal bleeding.  Last BM today. BM's are regular.  She bled on Tues after BM.  No hx. of hemorrhoids.

## 2014-11-04 NOTE — Discharge Instructions (Signed)
Gracias por venir hoy.    Dolor abdominal en las mujeres (Abdominal Pain, Women) El dolor abdominal (en el estmago, la pelvis o el vientre) puede tener muchas causas. Es importante que le informe a su mdico:  La ubicacin del Social research officer, government.  Viene y se va, o persiste todo el tiempo?  Hay situaciones que English as a second language teacher (comer ciertos alimentos, la actividad fsica)?  Tiene otros sntomas asociados al dolor (fiebre, nuseas, vmitos, diarrea)? Todo es de gran ayuda cuando se trata de hallar la causa del dolor. CAUSAS  Estmago: Infecciones por virus o bacterias, o lcera.  Intestino: Apendicitis (apndice inflamado), ileitis regional (enfermedad de Crohn), colitis ulcerosa (colon inflamado), sndrome del colon irritable, diverticulitis (inflamacin de los divertculos del colon) o cncer de estmago oo intestino.  Enfermedades de la vescula biliar o clculos.  Enfermedades renales, clculos o infecciones en el rin.  Infeccin o cncer del pncreas.  Fibromialgia (trastorno doloroso)  Enfermedades de los rganos femeninos:  Uterus: tero: fibroma (tumor no canceroso) o infeccin  Trompas de Falopio: infeccin o embarazo ectpico  En los ovarios, quistes o tumores.  Adherencias plvicas (tejido cicatrizal).  Endometriosis (el tejido que cubre el tero se desarrolla en la pelvis y los rganos plvicos).  Sndrome de Occupational psychologist (los rganos femeninos se llenan de sangre antes del periodo menstrual(  Dolor durante el periodo menstrual.  Dolor durante la ovulacin (al producir vulos).  Dolor al usar el DIU (dispositivo intrauterino para el control de la natalidad)  Programmer, systems los rganos femeninos.  Dolor funcional (no est originado en una enfermedad, puede mejorar sin tratamiento).  Dolor de origen psicolgico  Depresin. DIAGNSTICO Su mdico decidir la gravedad del dolor a travs del examen fsico  Anlisis de  sangre  Radiografas  Ecografas  TC (tomografa computada, tipo especial de radiografas).  IMR (resonancia magntica)  Cultivos, en el caso una infeccin  Colon por enema de bario (se inserta una sustancia de contraste en el intestino grueso para mejorar la observacin con rayos X.)  Colonoscopa (observacin del intestino con un tubo luminoso).  Laparoscopa (examen del interior del abdomen con un tubo que tiene [employer]).  Ciruga exploratoria abdominal mayor (se observa el abdomen realizando una gran incisin). TRATAMIENTO El tratamiento depender de la causa del problema.   Muchos de estos casos pueden controlarse y tratarse en casa.  Medicamentos de venta libre indicados por el mdico.  Medicamentos con receta.  Antibiticos, en caso de infeccin  Pldoras anticonceptivas, en el caso de perodos dolorosos o dolor al ovular.  Tratamiento hormonal, para la endometriosis  Inyecciones para bloqueo nervioso selectivo.  Fisioterapia.  Antidepresivos.  Consejos por parte de un psclogo o psiquiatra.  Ciruga mayor o menor. INSTRUCCIONES PARA EL CUIDADO DOMICILIARIO  No tome ni administre laxantes a menos que se lo haya indicado su mdico.  Tome analgsicos de venta libre slo si se lo ha indicado el profesional que lo asiste. No tome aspirina, ya que puede causar [employer] o hemorragias.  Consuma una dieta lquida (caldo o agua) segn lo indicado por el mdico. Progrese lentamente a una dieta blanda, segn la tolerancia, si el dolor se relaciona con el estmago o el intestino.  Tenga un termmetro y tmese la temperatura varias veces al da.  Haga reposo en la cama y [place], si esto Ship broker.  Evite las relaciones sexuales, Higher education careers adviser.  Evite las situaciones estresantes.  Cumpla con las visitas y los anlisis de control, segn las indicaciones  de su mdico.  Si el dolor no se [employer] o la [place], [place]  tratar con:  Acupuntura.  Ejercicios de relajacin (yoga, meditacin).  Terapia grupal.  Psicoterapia. SOLICITE ATENCIN MDICA SI:  Nota que ciertos Writer de [place].  El tratamiento indicado para Lexicographer no Engineer, civil (consulting).  Necesita analgsicos ms fuertes.  Quiere que le retiren el DIU.  Si se siente confundido o desfalleciente.  Presenta nuseas o vmitos.  Aparece una erupcin cutnea.  Sufre efectos adversos o una reaccin alrgica debido a los medicamentos que toma. SOLICITE ATENCIN MDICA DE INMEDIATO SI:  El dolor persiste o se agrava.  Tiene fiebre.  Siente el dolor slo en algunos sectores del abdomen. Si se localiza en la zona derecha, posiblemente podra tratarse de apendicitis. En un adulto, si se localiza en la regin inferior izquierda del abdomen, podra tratarse de colitis o diverticulitis.  Hay sangre en las heces (deposiciones de color rojo brillante o negro alquitranado), con o sin vmitos.  Usted presenta sangre en la orina.  Siente escalofros con o sin fiebre.  Se desmaya. ASEGRESE QUE:   Comprende estas instrucciones.  Controlar su enfermedad.  Solicitar ayuda de inmediato si no mejora o si empeora. Document Released: 12/20/2008 Document Revised: 11/26/2011 [facility] Patient Information 2015 [place]. This information is not intended to replace advice given to you by your health care provider. Make sure you discuss any questions you have with your health care provider.   Estreimiento (Constipation) Estreimiento significa que una persona tiene menos de tres evacuaciones en una semana, dificultad para defecar, o que las heces son secas, duras, o ms grandes que lo normal. A medida que envejecemos el estreimiento es ms comn. Si intenta curar el estreimiento con medicamentos que producen la evacuacin de las heces (laxantes), el problema puede empeorar. El uso prolongado de laxantes  puede hacer que los msculos del colon se debiliten. Una dieta baja en fibra, no tomar suficientes lquidos y el uso de ciertos medicamentos pueden Agricultural engineer.  CAUSAS   Ciertos medicamentos, como los antidepresivos, analgsicos, suplementos de hierro, anticidos y diurticos.  Algunas enfermedades, como la diabetes, el sndrome del colon irritable, enfermedad de la tiroides, o depresin.  No beber suficiente agua.  No consumir suficientes alimentos ricos en fibra.  Situaciones de estrs o viajes.  Falta de actividad fsica o de ejercicio.  Ignorar la necesidad sbita de Landscape architect.  Uso en exceso de laxantes. SIGNOS Y SNTOMAS   Defecar menos de tres veces por semana.  Dificultad para defecar.  Tener las heces secas y duras, o ms grandes que las normales.  Sensacin de estar lleno o hinchado.  Dolor en la parte baja del abdomen.  No sentir alivio despus de defecar. DIAGNSTICO  El mdico le har una historia clnica y un examen fsico. Pueden hacerle exmenes adicionales para el estreimiento grave. Estos estudios pueden ser:  Un radiografa con enema de bario para examinar el recto, el colon y, en algunos casos, el intestino delgado.  Una sigmoidoscopia para examinar el colon inferior.  Una colonoscopia para examinar todo el colon. TRATAMIENTO  El tratamiento depender de la gravedad del estreimiento y de la causa. Algunos tratamientos nutricionales son beber ms lquidos y comer ms alimentos ricos en fibra. El cambio en el estilo de vida incluye hacer ejercicios de [place] regular. Si estas recomendaciones para Animator dieta y en el estilo de vida no ayudan, el [employer]  puede indicar el uso de laxantes de venta libre para ayudarlo a Landscape architect. Los medicamentos recetados se pueden prescribir si los medicamentos de venta libre no lo [place].  INSTRUCCIONES PARA EL CUIDADO EN EL HOGAR   Consuma alimentos con alto contenido de [place], como frutas,  vegetales, cereales integrales y porotos.  Limite los alimentos procesados ricos en grasas y azcar, como las papas fritas, hamburguesas, galletas, dulces y refrescos.  Puede agregar un suplemento de fibra a su dieta si no obtiene lo suficiente de los alimentos.  Beba suficiente lquido para Consulting civil engineer orina clara o de color amarillo plido.  Haga ejercicio regularmente o segn las indicaciones del mdico.  Vaya al bao cuando sienta la necesidad de ir. No se aguante las ganas.  Tome solo medicamentos de venta libre o recetados, segn las indicaciones del mdico. No tome otros medicamentos para el estreimiento sin consultarlo antes con su mdico. SOLICITE ATENCIN MDICA DE INMEDIATO SI:   Observa sangre brillante en las heces.  El estreimiento dura ms de 4 das o [place].  Siente dolor abdominal o rectal.  Las heces son delgadas como un lpiz.  Pierde peso de [place] inexplicable. ASEGRESE DE QUE:   Comprende estas instrucciones.  Controlar su afeccin.  Recibir ayuda de inmediato si no mejora o si empeora. Document Released: 09/23/2007 Document Revised: 09/08/2013 [facility] Patient Information 2015 [place]. This information is not intended to replace advice given to you by your health care provider. Make sure you discuss any questions you have with your health care provider.

## 2014-11-12 ENCOUNTER — Encounter (HOSPITAL_COMMUNITY): Payer: Self-pay | Admitting: Emergency Medicine

## 2014-11-12 ENCOUNTER — Emergency Department (HOSPITAL_COMMUNITY)
Admission: EM | Admit: 2014-11-12 | Discharge: 2014-11-12 | Disposition: A | Discharge status: Home / Self Care | Payer: Self-pay | Attending: Emergency Medicine | Admitting: Emergency Medicine

## 2014-11-12 DIAGNOSIS — Z79899 Other long term (current) drug therapy: Secondary | ICD-10-CM | POA: Insufficient documentation

## 2014-11-12 DIAGNOSIS — Z9889 Other specified postprocedural states: Secondary | ICD-10-CM | POA: Insufficient documentation

## 2014-11-12 DIAGNOSIS — R110 Nausea: Secondary | ICD-10-CM | POA: Insufficient documentation

## 2014-11-12 DIAGNOSIS — Z3202 Encounter for pregnancy test, result negative: Secondary | ICD-10-CM | POA: Insufficient documentation

## 2014-11-12 DIAGNOSIS — R1031 Right lower quadrant pain: Secondary | ICD-10-CM | POA: Insufficient documentation

## 2014-11-12 DIAGNOSIS — R109 Unspecified abdominal pain: Secondary | ICD-10-CM

## 2014-11-12 DIAGNOSIS — Z8742 Personal history of other diseases of the female genital tract: Secondary | ICD-10-CM | POA: Insufficient documentation

## 2014-11-12 DIAGNOSIS — Z8719 Personal history of other diseases of the digestive system: Secondary | ICD-10-CM | POA: Insufficient documentation

## 2014-11-12 DIAGNOSIS — Z9089 Acquired absence of other organs: Secondary | ICD-10-CM | POA: Insufficient documentation

## 2014-11-12 DIAGNOSIS — M545 Low back pain: Secondary | ICD-10-CM | POA: Insufficient documentation

## 2014-11-12 DIAGNOSIS — Z9851 Tubal ligation status: Secondary | ICD-10-CM | POA: Insufficient documentation

## 2014-11-12 LAB — CBC WITH DIFFERENTIAL/PLATELET
BASOS ABS: 0.1 10*3/uL (ref 0.0–0.1)
Basophils Relative: 1 % (ref 0–1)
Eosinophils Absolute: 0.1 10*3/uL (ref 0.0–0.7)
Eosinophils Relative: 1 % (ref 0–5)
HEMATOCRIT: 38 % (ref 36.0–46.0)
HEMOGLOBIN: 12.2 g/dL (ref 12.0–15.0)
LYMPHS ABS: 2.7 10*3/uL (ref 0.7–4.0)
LYMPHS PCT: 32 % (ref 12–46)
MCH: 27.9 pg (ref 26.0–34.0)
MCHC: 32.1 g/dL (ref 30.0–36.0)
MCV: 87 fL (ref 78.0–100.0)
MONOS PCT: 4 % (ref 3–12)
Monocytes Absolute: 0.4 10*3/uL (ref 0.1–1.0)
Neutro Abs: 5.3 10*3/uL (ref 1.7–7.7)
Neutrophils Relative %: 62 % (ref 43–77)
PLATELETS: 326 10*3/uL (ref 150–400)
RBC: 4.37 MIL/uL (ref 3.87–5.11)
RDW: 13.6 % (ref 11.5–15.5)
WBC: 8.6 10*3/uL (ref 4.0–10.5)

## 2014-11-12 LAB — COMPREHENSIVE METABOLIC PANEL
ALBUMIN: 4.1 g/dL (ref 3.5–5.2)
ALT: 60 U/L — AB (ref 0–35)
AST: 49 U/L — ABNORMAL HIGH (ref 0–37)
Alkaline Phosphatase: 132 U/L — ABNORMAL HIGH (ref 39–117)
Anion gap: 7 (ref 5–15)
BUN: 11 mg/dL (ref 6–23)
CO2: 28 mmol/L (ref 19–32)
Calcium: 9.4 mg/dL (ref 8.4–10.5)
Chloride: 106 mmol/L (ref 96–112)
Creatinine, Ser: 0.76 mg/dL (ref 0.50–1.10)
GFR calc Af Amer: >90 mL/min (ref >90)
GLUCOSE: 120 mg/dL — AB (ref 70–99)
Potassium: 3.5 mmol/L (ref 3.5–5.1)
SODIUM: 141 mmol/L (ref 135–145)
TOTAL PROTEIN: 7.6 g/dL (ref 6.0–8.3)

## 2014-11-12 LAB — URINALYSIS, ROUTINE W REFLEX MICROSCOPIC
Bilirubin Urine: NEGATIVE
Glucose, UA: NEGATIVE mg/dL
Hgb urine dipstick: NEGATIVE
Ketones, ur: 15 mg/dL — AB
LEUKOCYTES UA: NEGATIVE
Nitrite: NEGATIVE
PH: 7 (ref 5.0–8.0)
PROTEIN: NEGATIVE mg/dL
SPECIFIC GRAVITY, URINE: 1.01 (ref 1.005–1.030)
UROBILINOGEN UA: 0.2 mg/dL (ref 0.0–1.0)

## 2014-11-12 LAB — POC URINE PREG, ED: Preg Test, Ur: NEGATIVE

## 2014-11-12 MED ORDER — OXYCODONE-ACETAMINOPHEN 5-325 MG PO TABS
1.0000 | ORAL_TABLET | Freq: Once | ORAL | Status: AC
  Administered 2014-11-12: 1 via ORAL
  Filled 2014-11-12: qty 1

## 2014-11-12 MED ORDER — SODIUM CHLORIDE 0.9 % IV BOLUS (SEPSIS)
1000.0000 mL | Freq: Once | INTRAVENOUS | Status: AC
  Administered 2014-11-12: 1000 mL via INTRAVENOUS

## 2014-11-12 NOTE — Discharge Instructions (Signed)
Return to the emergency room with worsening of symptoms, new symptoms or with symptoms that are concerning , especially fevers, abdominal pain in one area, unable to keep down fluids, blood in stool or vomit, severe pain, you feel faint, lightheaded or pass out. Tylenol for discomfort at home. Call to make appointment with gastroenterology as soon as possible for further workup of your abdominal pain. Call number above. Read below information and follow recommendations.   Dolor abdominal en las mujeres (Abdominal Pain, Women) El dolor abdominal (en el estmago, la pelvis o el vientre) puede tener muchas causas. Es importante que le informe a su mdico:  La ubicacin del Social research officer, government.  Viene y se va, o persiste todo el tiempo?  Hay situaciones que English as a second language teacher (comer ciertos alimentos, la actividad fsica)?  Tiene otros sntomas asociados al dolor (fiebre, nuseas, vmitos, diarrea)? Todo es de gran ayuda cuando se trata de hallar la causa del dolor. CAUSAS  Estmago: Infecciones por virus o bacterias, o lcera.  Intestino: Apendicitis (apndice inflamado), ileitis regional (enfermedad de Crohn), colitis ulcerosa (colon inflamado), sndrome del colon irritable, diverticulitis (inflamacin de los divertculos del colon) o cncer de estmago oo intestino.  Enfermedades de la vescula biliar o clculos.  Enfermedades renales, clculos o infecciones en el rin.  Infeccin o cncer del pncreas.  Fibromialgia (trastorno doloroso)  Enfermedades de los rganos femeninos:  Uterus: tero: fibroma (tumor no canceroso) o infeccin  Trompas de Falopio: infeccin o embarazo ectpico  En los ovarios, quistes o tumores.  Adherencias plvicas (tejido cicatrizal).  Endometriosis (el tejido que cubre el tero se desarrolla en la pelvis y los rganos plvicos).  Sndrome de Occupational psychologist (los rganos femeninos se llenan de sangre antes del periodo menstrual(  Dolor durante el periodo  menstrual.  Dolor durante la ovulacin (al producir vulos).  Dolor al usar el DIU (dispositivo intrauterino para el control de la natalidad)  Programmer, systems los rganos femeninos.  Dolor funcional (no est originado en una enfermedad, puede mejorar sin tratamiento).  Dolor de origen psicolgico  Depresin. DIAGNSTICO Su mdico decidir la gravedad del dolor a travs del examen fsico  Anlisis de sangre  Radiografas  Ecografas  TC (tomografa computada, tipo especial de radiografas).  IMR (resonancia magntica)  Cultivos, en el caso una infeccin  Colon por enema de bario (se inserta una sustancia de contraste en el intestino grueso para mejorar la observacin con rayos X.)  Colonoscopa (observacin del intestino con un tubo luminoso).  Laparoscopa (examen del interior del abdomen con un tubo que tiene [employer]).  Ciruga exploratoria abdominal mayor (se observa el abdomen realizando una gran incisin). TRATAMIENTO El tratamiento depender de la causa del problema.   Muchos de estos casos pueden controlarse y tratarse en casa.  Medicamentos de venta libre indicados por el mdico.  Medicamentos con receta.  Antibiticos, en caso de infeccin  Pldoras anticonceptivas, en el caso de perodos dolorosos o dolor al ovular.  Tratamiento hormonal, para la endometriosis  Inyecciones para bloqueo nervioso selectivo.  Fisioterapia.  Antidepresivos.  Consejos por parte de un psclogo o psiquiatra.  Ciruga mayor o menor. INSTRUCCIONES PARA EL CUIDADO DOMICILIARIO  No tome ni administre laxantes a menos que se lo haya indicado su mdico.  Tome analgsicos de venta libre slo si se lo ha indicado el profesional que lo asiste. No tome aspirina, ya que puede causar [employer] o hemorragias.  Consuma una dieta lquida (caldo o agua) segn lo indicado por el mdico. Progrese lentamente  a una [employer], segn la tolerancia, si el dolor se relaciona  con el estmago o el intestino.  Tenga un termmetro y tmese la temperatura varias veces al da.  Haga reposo en la cama y [place], si esto Ship broker.  Evite las relaciones sexuales, Higher education careers adviser.  Evite las situaciones estresantes.  Cumpla con las visitas y los anlisis de control, segn las indicaciones de su mdico.  Si el dolor no se [place] con los medicamentos o la [place], [place] tratar con:  Acupuntura.  Ejercicios de relajacin (yoga, meditacin).  Terapia grupal.  Psicoterapia. SOLICITE ATENCIN MDICA SI:  Nota que ciertos Writer de [place].  El tratamiento indicado para Lexicographer no Engineer, civil (consulting).  Necesita analgsicos ms fuertes.  Quiere que le retiren el DIU.  Si se siente confundido o desfalleciente.  Presenta nuseas o vmitos.  Aparece una erupcin cutnea.  Sufre efectos adversos o una reaccin alrgica debido a los medicamentos que toma. SOLICITE ATENCIN MDICA DE INMEDIATO SI:  El dolor persiste o se agrava.  Tiene fiebre.  Siente el dolor slo en algunos sectores del abdomen. Si se localiza en la zona derecha, posiblemente podra tratarse de apendicitis. En un adulto, si se localiza en la regin inferior izquierda del abdomen, podra tratarse de colitis o diverticulitis.  Hay sangre en las heces (deposiciones de color rojo brillante o negro alquitranado), con o sin vmitos.  Usted presenta sangre en la orina.  Siente escalofros con o sin fiebre.  Se desmaya. ASEGRESE QUE:   Comprende estas instrucciones.  Controlar su enfermedad.  Solicitar ayuda de inmediato si no mejora o si empeora. Document Released: 12/20/2008 Document Revised: 11/26/2011 [facility] Patient Information 2015 [place]. This information is not intended to replace advice given to you by your health care provider. Make sure you discuss any questions you have with your health care provider.

## 2014-11-12 NOTE — ED Provider Notes (Signed)
CSN: [id number]     Arrival date & time 11/12/14  1614 History   First MD Initiated Contact with Patient 11/12/14 1809     Chief Complaint  Patient presents with  . Flank Pain  . Abdominal Pain     (Consider location/radiation/quality/duration/timing/severity/associated sxs/prior Treatment) The history is provided by the patient. A language interpreter was used.    Lorraine [name] is a 29 y.o. female with PMH of pyelonephritis, diverticulitis presenting with persistent right low back and flank pain. Patient was diagnosed with pyelonephritis and completed her Cipro course 3 days ago. Patient states the pain has persisted and is achy with acute worsening last 3 days. Pain comes and goes. She denies any hematuria, cloudy urine or foul odor No urinary symptoms. She does endorse some nausea. No fevers chills. She endorses nausea but no vomiting. No changes in her stool. Surgical history includes tubal ligation cesarean as well as cholecystectomy. No pelvic complaints.   Past Medical History  Diagnosis Date  . Diverticulitis   . Pyelonephritis    Past Surgical History  Procedure Laterality Date  . Cholecystectomy    . Cesarean section    . Cesarean section N/A 2006  . Cesarean section N/A 09/03/2014    Procedure: CESAREAN SECTION;  Surgeon: Guss Bunde, MD;  Location: [facility];  Service: Obstetrics;  Laterality: N/A;  . Tubal ligation     Family History  Problem Relation Age of Onset  . Hyperlipidemia Mother   . Diabetes Maternal Uncle   . Diabetes Paternal Grandmother    History  Substance Use Topics  . Smoking status: Never Smoker   . Smokeless tobacco: Never Used  . Alcohol Use: No   OB History    Gravida Para Term Preterm AB TAB SAB Ectopic Multiple Living   5 3 1 2 2  2  1 2      Review of Systems 10 Systems reviewed and are negative for acute change except as noted in the HPI.    Allergies  Review of patient's allergies indicates no known allergies.  Home  Medications   Prior to Admission medications   Medication Sig Start Date End Date Taking? Authorizing Provider  acetaminophen (TYLENOL) 500 MG tablet Take 1 tablet (500 mg total) by mouth every 6 (six) hours as needed (pain). 11/01/14  Yes Simbiso Ranga, MD  albuterol (PROVENTIL HFA;VENTOLIN HFA) 108 (90 BASE) MCG/ACT inhaler Inhale 4 puffs into the lungs every 6 (six) hours as needed for wheezing or shortness of breath. 08/13/14  Yes Cordelia Poche, MD  ibuprofen (ADVIL,MOTRIN) 600 MG tablet Take 1 tablet (600 mg total) by mouth every 6 (six) hours. 09/06/14  Yes Lisa A Leftwich-Kirby, CNM  oxyCODONE-acetaminophen (PERCOCET/ROXICET) 5-325 MG per tablet Take 1 tablet by mouth every 6 (six) hours as needed for severe pain. 11/01/14  Yes Simbiso Ranga, MD  polyethylene glycol powder (GLYCOLAX/MIRALAX) powder Take 17 g by mouth daily. 11/04/14  Yes Gregor Hams, MD   BP 108/65 mmHg  Pulse 93  Temp(Src) 98 F (36.7 C) (Oral)  Resp 18  SpO2 100%  LMP 12/15/2013 (Exact Date) Physical Exam  Constitutional: She appears well-developed and well-nourished. No distress.  HENT:  Head: Normocephalic and atraumatic.  Mouth/Throat: Oropharynx is clear and moist.  Eyes: Conjunctivae and EOM are normal. Right eye exhibits no discharge. Left eye exhibits no discharge.  Cardiovascular: Normal rate and regular rhythm.   Pulmonary/Chest: Effort normal and breath sounds normal. No respiratory distress. She has no wheezes.  Abdominal:  Soft. Bowel sounds are normal. She exhibits no distension.  Right flank tenderness to palpation. RLQ abdominal tenderness without rebound, rigidity or guarding.  Neurological: She is alert. She exhibits normal muscle tone. Coordination normal.  Skin: Skin is warm and dry. She is not diaphoretic.  Nursing note and vitals reviewed.   ED Course  Procedures (including critical care time) Labs Review Labs Reviewed  COMPREHENSIVE METABOLIC PANEL - Abnormal; Notable for the following:     Glucose, Bld 120 (*)    AST 49 (*)    ALT 60 (*)    Alkaline Phosphatase 132 (*)    Total Bilirubin <0.1 (*)    All other components within normal limits  URINALYSIS, ROUTINE W REFLEX MICROSCOPIC - Abnormal; Notable for the following:    Ketones, ur 15 (*)    All other components within normal limits  CBC WITH DIFFERENTIAL/PLATELET  POC URINE PREG, ED    Imaging Review No results found.   EKG Interpretation None      MDM   Final diagnoses:  Right sided abdominal pain  Nausea   Patient presenting with persistent right-sided abdominal pain as well as nausea. Patient has had 2 CTs in the past month that have been unremarkable. Patient was admitted early this month for the same pain. She was diagnosed with pyelonephritis. Patient has finished her Cipro course and urine without infection today. Evidence of dehydration. Bolus given. VSS. Patient with right-sided abdominal tenderness without rebound, rigidity, guarding. No evidence of peritonitis. Patient currently breast-feeding. Discussed the risks and benefits of Percocet and that is excreted in the breast milk and can have effects on her child. Patient voices understanding and accepts the risks and feels she needs the medication. This was through a Optometrist. Patient's lab work reassuring. Elevated LFTs but are stable from prior. Patient with decreased tenderness in her abdominal exam on repeat. Concern for pt's radiation exposure with two CT's in this month last one 10/30/13 and patient's abdominal pain likely chronic in nature. Discussed this with patient who agrees to forgo further imaging. I doubt acute abdominal process. Pt tolerating fluids in ED. Patient to follow-up with gastroenterology for further workup.  Discussed return precautions with patient. Discussed all results and patient verbalizes understanding and agrees with plan.  Case has been discussed with Dr. Darl Householder who agrees with the above plan and to discharge.   Filed  Vitals:   11/12/14 1945  BP: 108/65  Pulse: 93  Temp:   Resp: [id number], PA-C 11/13/14 0047  Wandra Arthurs, MD 11/13/14 1504

## 2014-11-12 NOTE — ED Notes (Signed)
Pt c/o right sided flank and abd pain; pt recently discharged for pyelonephritis; pt 2 months postpartum

## 2014-11-15 DIAGNOSIS — R1011 Right upper quadrant pain: Secondary | ICD-10-CM | POA: Insufficient documentation

## 2014-11-18 ENCOUNTER — Emergency Department (HOSPITAL_COMMUNITY)
Admission: EM | Admit: 2014-11-18 | Discharge: 2014-11-18 | Disposition: A | Discharge status: Home / Self Care | Payer: Medicaid Other | Attending: Emergency Medicine | Admitting: Emergency Medicine

## 2014-11-18 ENCOUNTER — Encounter (HOSPITAL_COMMUNITY): Payer: Self-pay | Admitting: Emergency Medicine

## 2014-11-18 DIAGNOSIS — Z9851 Tubal ligation status: Secondary | ICD-10-CM | POA: Insufficient documentation

## 2014-11-18 DIAGNOSIS — R109 Unspecified abdominal pain: Secondary | ICD-10-CM

## 2014-11-18 DIAGNOSIS — Z79899 Other long term (current) drug therapy: Secondary | ICD-10-CM | POA: Insufficient documentation

## 2014-11-18 DIAGNOSIS — J45909 Unspecified asthma, uncomplicated: Secondary | ICD-10-CM | POA: Insufficient documentation

## 2014-11-18 DIAGNOSIS — N119 Chronic tubulo-interstitial nephritis, unspecified: Secondary | ICD-10-CM

## 2014-11-18 DIAGNOSIS — Z8719 Personal history of other diseases of the digestive system: Secondary | ICD-10-CM | POA: Insufficient documentation

## 2014-11-18 DIAGNOSIS — Z3202 Encounter for pregnancy test, result negative: Secondary | ICD-10-CM | POA: Insufficient documentation

## 2014-11-18 DIAGNOSIS — Z792 Long term (current) use of antibiotics: Secondary | ICD-10-CM | POA: Insufficient documentation

## 2014-11-18 DIAGNOSIS — Z9049 Acquired absence of other specified parts of digestive tract: Secondary | ICD-10-CM | POA: Insufficient documentation

## 2014-11-18 HISTORY — DX: Unspecified asthma, uncomplicated: J45.909

## 2014-11-18 LAB — CBC WITH DIFFERENTIAL/PLATELET
Basophils Absolute: 0 10*3/uL (ref 0.0–0.1)
Basophils Relative: 1 % (ref 0–1)
Eosinophils Absolute: 0.2 10*3/uL (ref 0.0–0.7)
Eosinophils Relative: 2 % (ref 0–5)
HEMATOCRIT: 36.7 % (ref 36.0–46.0)
HEMOGLOBIN: 11.9 g/dL — AB (ref 12.0–15.0)
LYMPHS PCT: 37 % (ref 12–46)
Lymphs Abs: 3.1 10*3/uL (ref 0.7–4.0)
MCH: 28.9 pg (ref 26.0–34.0)
MCHC: 32.4 g/dL (ref 30.0–36.0)
MCV: 89.1 fL (ref 78.0–100.0)
MONO ABS: 0.5 10*3/uL (ref 0.1–1.0)
Monocytes Relative: 5 % (ref 3–12)
NEUTROS ABS: 4.7 10*3/uL (ref 1.7–7.7)
Neutrophils Relative %: 55 % (ref 43–77)
Platelets: 309 10*3/uL (ref 150–400)
RBC: 4.12 MIL/uL (ref 3.87–5.11)
RDW: 13.7 % (ref 11.5–15.5)
WBC: 8.5 10*3/uL (ref 4.0–10.5)

## 2014-11-18 LAB — COMPREHENSIVE METABOLIC PANEL
ALBUMIN: 4.1 g/dL (ref 3.5–5.2)
ALT: 55 U/L — ABNORMAL HIGH (ref 0–35)
ANION GAP: 5 (ref 5–15)
AST: 40 U/L — ABNORMAL HIGH (ref 0–37)
Alkaline Phosphatase: 108 U/L (ref 39–117)
BUN: 15 mg/dL (ref 6–23)
CHLORIDE: 109 mmol/L (ref 96–112)
CO2: 26 mmol/L (ref 19–32)
Calcium: 9.2 mg/dL (ref 8.4–10.5)
Creatinine, Ser: 0.68 mg/dL (ref 0.50–1.10)
GFR calc Af Amer: >90 mL/min (ref >90)
GFR calc non Af Amer: >90 mL/min (ref >90)
GLUCOSE: 91 mg/dL (ref 70–99)
POTASSIUM: 3.6 mmol/L (ref 3.5–5.1)
Sodium: 140 mmol/L (ref 135–145)
Total Bilirubin: 0.4 mg/dL (ref 0.3–1.2)
Total Protein: 7.5 g/dL (ref 6.0–8.3)

## 2014-11-18 LAB — URINALYSIS, ROUTINE W REFLEX MICROSCOPIC
BILIRUBIN URINE: NEGATIVE
GLUCOSE, UA: NEGATIVE mg/dL
HGB URINE DIPSTICK: NEGATIVE
Ketones, ur: NEGATIVE mg/dL
Nitrite: NEGATIVE
PH: 6 (ref 5.0–8.0)
PROTEIN: NEGATIVE mg/dL
Specific Gravity, Urine: 1.027 (ref 1.005–1.030)
UROBILINOGEN UA: 0.2 mg/dL (ref 0.0–1.0)

## 2014-11-18 LAB — URINE MICROSCOPIC-ADD ON

## 2014-11-18 LAB — POC URINE PREG, ED: Preg Test, Ur: NEGATIVE

## 2014-11-18 MED ORDER — SODIUM CHLORIDE 0.9 % IV BOLUS (SEPSIS)
1000.0000 mL | Freq: Once | INTRAVENOUS | Status: AC
  Administered 2014-11-18: 1000 mL via INTRAVENOUS

## 2014-11-18 MED ORDER — LEVOFLOXACIN 750 MG PO TABS: 750.0000 mg | ORAL_TABLET | Freq: Every day | ORAL | Status: DC

## 2014-11-18 MED ORDER — OXYCODONE-ACETAMINOPHEN 5-325 MG PO TABS
2.0000 | ORAL_TABLET | Freq: Once | ORAL | Status: AC
  Administered 2014-11-18: 2 via ORAL
  Filled 2014-11-18: qty 2

## 2014-11-18 MED ORDER — OXYCODONE-ACETAMINOPHEN 5-325 MG PO TABS: 1.0000 | ORAL_TABLET | ORAL | Status: DC | PRN

## 2014-11-18 NOTE — ED Provider Notes (Signed)
CSN: [id number]     Arrival date & time 11/18/14  1902 History   First MD Initiated Contact with Patient 11/18/14 1925     Chief Complaint  Patient presents with  . Flank Pain     (Consider location/radiation/quality/duration/timing/severity/associated sxs/prior Treatment) Patient is a 29 y.o. female presenting with flank pain. The history is provided by the patient and medical records. No language interpreter was used.  Flank Pain Associated symptoms include abdominal pain. Pertinent negatives include no chest pain, diaphoresis, fatigue, fever, headaches, nausea, rash or vomiting.    Lorraine [name] is a 29 y.o. female  with a hx of pyelonephritis, asthma presents to the Emergency Department complaining of gradual, persistent, progressively worsening right flank pain onset several weeks ago, but worsening again in the last 2 days.  Pt reports dx of pyelonephritis with intermittent flank pain both before and after this..  She reports she completed a course of cipro with only minimal relief.  Patient is 10 weeks postpartum and she reports that this pain began after the delivery of her son. Associated symptoms include dysuria and nausea without vomiting or diarrhea. Patient reports she has been taking Percocet for the pain with only minimal relief. Nothing seems to make her symptoms better or worse. Patient reports she does not have a primary care physician and has not followed up with anyone since she was diagnosed with this problem.  Patient reports that her menses has not yet returned since her son was born.  Past Medical History  Diagnosis Date  . Diverticulitis   . Pyelonephritis   . Pyelonephritis   . Asthma    Past Surgical History  Procedure Laterality Date  . Cholecystectomy    . Cesarean section    . Cesarean section N/A 2006  . Cesarean section N/A 09/03/2014    Procedure: CESAREAN SECTION;  Surgeon: Guss Bunde, MD;  Location: [facility];  Service: Obstetrics;  Laterality:  N/A;  . Tubal ligation     Family History  Problem Relation Age of Onset  . Hyperlipidemia Mother   . Diabetes Maternal Uncle   . Diabetes Paternal Grandmother    History  Substance Use Topics  . Smoking status: Never Smoker   . Smokeless tobacco: Never Used  . Alcohol Use: No   OB History    Gravida Para Term Preterm AB TAB SAB Ectopic Multiple Living   5 3 1 2 2  2  1 2      Review of Systems  Constitutional: Negative for fever, diaphoresis, appetite change and fatigue.  Respiratory: Negative for shortness of breath.   Cardiovascular: Negative for chest pain.  Gastrointestinal: Positive for abdominal pain. Negative for nausea, vomiting, diarrhea, constipation and blood in stool.  Genitourinary: Positive for dysuria and flank pain. Negative for urgency, frequency, hematuria and difficulty urinating.  Musculoskeletal: Negative for back pain.  Skin: Negative for rash.  Neurological: Negative for headaches.  All other systems reviewed and are negative.     Allergies  Review of patient's allergies indicates no known allergies.  Home Medications   Prior to Admission medications   Medication Sig Start Date End Date Taking? Authorizing Provider  acetaminophen (TYLENOL) 500 MG tablet Take 1 tablet (500 mg total) by mouth every 6 (six) hours as needed (pain). 11/01/14  Yes Simbiso Ranga, MD  albuterol (PROVENTIL HFA;VENTOLIN HFA) 108 (90 BASE) MCG/ACT inhaler Inhale 4 puffs into the lungs every 6 (six) hours as needed for wheezing or shortness of breath. 08/13/14  Yes Deidre Ala  Lonny Prude, MD  ibuprofen (ADVIL,MOTRIN) 600 MG tablet Take 1 tablet (600 mg total) by mouth every 6 (six) hours. Patient taking differently: Take 600 mg by mouth every 6 (six) hours as needed for moderate pain (pain).  09/06/14  Yes Lisa A Leftwich-Kirby, CNM  polyethylene glycol powder (GLYCOLAX/MIRALAX) powder Take 17 g by mouth daily. Patient taking differently: Take 17 g by mouth daily as needed for mild  constipation (constipation).  11/04/14  Yes Gregor Hams, MD  levofloxacin (LEVAQUIN) 750 MG tablet Take 1 tablet (750 mg total) by mouth daily. X 7 days 11/18/14   Jarrett Soho Cordney Barstow, PA-C  oxyCODONE-acetaminophen (PERCOCET/ROXICET) 5-325 MG per tablet Take 1-2 tablets by mouth every 4 (four) hours as needed for severe pain. 11/18/14   Kennedey Digilio, PA-C   BP 115/95 mmHg  Pulse 75  Temp(Src) 98.1 F (36.7 C) (Oral)  Resp 20  SpO2 100%  LMP 12/15/2013 (Exact Date) Physical Exam  Constitutional: She appears well-developed and well-nourished. No distress.  HENT:  Head: Normocephalic and atraumatic.  Mouth/Throat: Oropharynx is clear and moist. No oropharyngeal exudate.  Cardiovascular: Normal rate, regular rhythm, normal heart sounds and intact distal pulses.   Pulmonary/Chest: Effort normal and breath sounds normal. No respiratory distress. She has no wheezes.  Abdominal: Soft. Bowel sounds are normal. She exhibits no distension and no mass. There is tenderness (RLQ). There is CVA tenderness (right). There is no rebound and no guarding.  Mild RLQ abd tenderness with moderate Right flank pain NO rebound, peritoneal signs or rigidity  Musculoskeletal: Normal range of motion. She exhibits no edema.  Neurological: She is alert.  Skin: Skin is warm and dry. No rash noted. She is not diaphoretic.  Psychiatric: She has a normal mood and affect.  Nursing note and vitals reviewed.   ED Course  Procedures (including critical care time) Labs Review Labs Reviewed  CBC WITH DIFFERENTIAL/PLATELET - Abnormal; Notable for the following:    Hemoglobin 11.9 (*)    All other components within normal limits  COMPREHENSIVE METABOLIC PANEL - Abnormal; Notable for the following:    AST 40 (*)    ALT 55 (*)    All other components within normal limits  URINALYSIS, ROUTINE W REFLEX MICROSCOPIC - Abnormal; Notable for the following:    APPearance CLOUDY (*)    Leukocytes, UA SMALL (*)    All other  components within normal limits  URINE MICROSCOPIC-ADD ON - Abnormal; Notable for the following:    Squamous Epithelial / LPF FEW (*)    All other components within normal limits  POC URINE PREG, ED    Imaging Review No results found.   EKG Interpretation None      MDM   Final diagnoses:  Right flank pain  Chronic pyelonephritis    Lorraine [name] resents the emergency department with complaints of right flank and right lower quadrant abdominal pain. Patient has been seen 6 times for these complaints in the last few weeks. She was admitted at one point and diagnosed with pyelonephritis. She has finished a course of Cipro without relief. Patient was last seen on 11/13/2014 and discharged home with Percocet. At that time her urine did not show any evidence of infection.  Today patient is afebrile, non-tachycardic and without hypotension. Her pregnancy test is negative and she is without a leukocytosis. Urinalysis is with small leukocytes and 3-6 white blood cells. This is a catheterized sample.  Question chronic pyelonephritis at this point versus chronic pain. Will discharge home with pain  control and course of Levaquin. Patient has been seen by case management who recommends that [facility] also been given [facility].  Patient reports that she uses formula for her infant when she is taking the pain medicine. She is counseled to continue doing so as to prevent him from hitting large doses of pain medicine.  I have personally reviewed patient's vitals, nursing note and any pertinent labs or imaging.  I performed an undressed physical exam.    It has been determined that no acute conditions requiring further emergency intervention are present at this time. The patient/guardian have been advised of the diagnosis and plan. I reviewed all labs and imaging including any potential incidental findings. We have discussed signs  and symptoms that warrant return to the ED and they are listed in the discharge instructions.    Vital signs are stable at discharge.   BP 115/95 mmHg  Pulse 75  Temp(Src) 98.1 F (36.7 C) (Oral)  Resp 20  SpO2 100%  LMP 12/15/2013 (Exact Date)   The patient was discussed with and seen by Dr. Vanita Panda who agrees with the treatment plan.     Jarrett Soho Rulon Abdalla, PA-C 11/19/14 0010  Carmin Muskrat, MD 11/19/14 [phone]

## 2014-11-18 NOTE — Discharge Instructions (Signed)
1. Medications: levaquin, percocet, usual home medications 2. Treatment: rest, drink plenty of fluids,  3. Follow Up: [facility] in 2-3 days for discussion of your diagnoses and further evaluation after today's visit; Please return to the ER for worsening symptoms, persistent vomiting or other concerns    Dolor en el flanco  (Flank Pain) El dolor en el flanco se refiere a aquel dolor que se localiza en un lado del cuerpo, entre la zona superior del abdomen y Counsellor. Puede aparecer en un perodo corto de tiempo (agudo) o puede durar mucho tiempo o ser recurrente (crnico). Puede ser leve o intenso. Puede tener diferentes causas.  CAUSAS  Algunas de las causas ms comunes son:   Esguince muscular.   Espasmos musculares.   Problemas en la columna vertebral (enfermedad de los discos).   Infeccin en el pulmn (neumona).   Lquido en los pulmones (edema pulmonar).   Infeccin renal.   Piedras en el rin.   Una erupcin cutnea muy dolorosa causada por el virus de la varicela (herpes zoster).  Enfermedad de la vescula biliar. INSTRUCCIONES PARA EL CUIDADO EN EL HOGAR  Los cuidados en el hogar dependern de la causa del dolor. En general:   Haga reposo segn las indicaciones del mdico.  Debe ingerir gran cantidad de lquido para mantener la orina de tono claro o color amarillo plido.  Tome slo medicamentos de venta libre o recetados, segn las indicaciones del mdico. Algunos medicamentos ayudan a Best boy.  Informe al mdico si tiene algn Risk manager.  Concurra a las visitas de control, segn las indicaciones. SOLICITE ATENCIN MDICA DE INMEDIATO SI:   El dolor no se alivia con los [employer].   Tiene sntomas nuevos o que empeoran.  El dolor [place].   Siente dolor abdominal.   Le falta el aire.   Tiene nuseas o vmitos persistentes.   El abdomen se hincha.   Sufre  mareos o se desmaya.   Observa sangre en la orina.  Tiene fiebre o sntomas persistentes durante ms de 2  3 das.  Tiene fiebre y los sntomas empeoran repentinamente. ASEGRESE DE QUE:   Comprende estas instrucciones.  Controlar su enfermedad.  Solicitar ayuda de inmediato si no mejora o si empeora. Document Released: 12/11/2007 Document Revised: 05/28/2012 [employer] Patient Information 2015 [place]. This information is not intended to replace advice given to you by your health care provider. Make sure you discuss any questions you have with your health care provider.    Emergency Department Resource Guide 1) Find a Doctor and Pay Out of Pocket Although you won't have to find out who is covered by your insurance plan, it is a good idea to ask around and get recommendations. You will then need to call the office and see if the doctor you have chosen will accept you as a new patient and what types of options they offer for patients who are self-pay. Some doctors offer discounts or will set up payment plans for their patients who do not have insurance, but you will need to ask so you aren't surprised when you get to your appointment.  2) Contact Your Local Health Department Not all health departments have doctors that can see patients for sick visits, but many do, so it is worth a call to see if yours does. If you don't know where your local health department is, you can check in your phone book. The CDC  also has a tool to help you locate your state's health department, and many state websites also have listings of all of their local health departments.  3) Find a [facility] If your illness is not likely to be very severe or complicated, you may want to try a walk in clinic. These are popping up all over the country in pharmacies, drugstores, and shopping centers. They're usually staffed by nurse practitioners or physician assistants that have been trained to treat common  illnesses and complaints. They're usually fairly quick and inexpensive. However, if you have serious medical issues or chronic medical problems, these are probably not your best option.  No Primary Care Doctor: - Call Health Connect at  [phone] - they can help you locate a primary care doctor that  accepts your insurance, provides certain services, etc. - Physician Referral Service- [phone]  Chronic Pain Problems: Organization         Address  Phone   Notes  [facility]  [phone] Patients need to be referred by their primary care doctor.   Medication Assistance: Organization         Address  Phone   Notes  [facility] Medication [facility] [address] [place], [place] [address] [phone] --Must be a resident of [facility] -- Must have NO insurance coverage whatsoever (no Medicaid/ Medicare, etc.) -- The pt. MUST have a primary care doctor that directs their care regularly and follows them in the community   MedAssist  [phone]   [employer]  [phone]    Agencies that provide inexpensive medical care: Organization         Address  Phone   Notes  [place]  [phone]   [facility]    [phone]   [facility] [place], [place] [address] [phone]   [place] [address] [place] [phone]   Planned Parenthood    [phone]   [facility]    [phone]   [place] and [place] [address], [place] Phone:  [phone], Fax:  [phone] Hours of Operation:  9 am - 6 pm, M-F.  Also accepts Medicaid/Medicare and self-pay.  [facility] for [address], [address], [place] Phone: [phone], Fax: [phone]. Hours of Operation:  8:30 am - 5:30 pm, M-F.  Also accepts Medicaid and self-pay.  [facility] [place]  [address] [place] Phone: [phone]   [place], [place], [place] [phone], Ext. [phone] Mondays & Thursdays: 7-9 AM.  First 15 patients are seen on a first come, first serve basis.    [place] Providers:  Organization         Address  Phone   Notes  [facility] [address],  [phone] Also accepts self-pay patients.  [facility] [address], [place]  [phone]   [place], [address], [place] [phone]   [facility] [address] [place] [phone]   Lucianne Lei [address], [place]   [phone] Only accepts [place] Access [place] patients after they have their name applied to their card.   Self-Pay (no insurance)  in [facility]:  Organization         Address  Phone   Notes  Sickle Cell Patients, [facility] [place] [phone]   [facility] [place] [phone]   [facility] [place]  [address], [address], [place] [phone]   [facility]/Dr. Osei-Bonsu  [address], [place] or [address], [place] [phone] Phone number for both [place] and [place] locations is the same.  [facility] [address], [place] [phone]   [facility] [address] [place] or [address] [phone]   [facility] [address], [place] [phone], phone; [phone], fax Sees patients 1st and 3rd Saturday of every month.  Must not qualify for public or private insurance (i.e. Medicaid, Medicare, [place] Health Choice, Veterans' Benefits)  Household income should be no more than 200% of the  poverty level The clinic cannot treat you if you are pregnant or think you are pregnant  Sexually transmitted diseases are not treated at the clinic.    Dental Care: Organization         Address  Phone  Notes  [facility] [place] [phone] Accepts children up to age 33 who are enrolled in [place] or [place]; pregnant women with a Medicaid card; and children who have applied for Medicaid or [place] Health Choice, but were declined, whose parents can pay a reduced fee at time of service.  [facility]  [address], [place] [phone] Accepts children up to age 39 who are enrolled in [place] or [place]; pregnant women with a Medicaid card; and children who have applied for Medicaid or [place] Health Choice, but were declined, whose parents can pay a reduced fee at time of service.  [place] Adult Dental Access PROGRAM  [place] [phone] Patients are seen by appointment only. Walk-ins are not accepted. [place] will see patients 68 years of age and older. Monday - Tuesday (8am-5pm) Most Wednesdays (8:30-5pm) $30 per visit, cash only  [facility] Adult Dental Access PROGRAM  [address], [facility] [place] [phone] Patients are seen by appointment only. Walk-ins are not accepted. [place] will see patients 16 years of age and older. One Wednesday Evening (Monthly: Volunteer Based).  $30 per visit, cash only  [place]  [phone] for adults; Children under age 54, call Graduate Pediatric Dentistry at [phone]. Children aged 85-14, please call [phone] to request a pediatric application.  Dental services are provided in all areas of dental care including fillings, crowns and bridges, complete and partial dentures, implants, gum treatment, root canals, and extractions.  Preventive care is also provided. Treatment is provided to both adults and children. Patients are selected via a lottery and there is often a waiting list.   [facility] [address], [place]  [phone] [email or web address]   [facility] [address] [place], [place] [phone], Ext. [phone] Second and Fourth Thursday of each month, opens at 6:30 AM; Clinic ends at 9 AM.  Patients are seen on a first-come first-served basis, and a limited number are seen during  each clinic.   [facility]  [address] Hillard Danker [place], [place] [phone]   Eligibility Requirements You must have lived in [place], [place], or [place] counties for at least the last three months.   You cannot be eligible for state or federal sponsored [employer], including [employer], [place], or [employer].   You generally cannot be eligible for healthcare insurance through your employer.    How to apply: Eligibility screenings are held every Tuesday and Wednesday afternoon from 1:00 pm until 4:00 pm. You do not need an appointment for the interview!  [facility] [address], [place], [place]   [place]  [place] Department  [place]  [phone]    Behavioral Health Resources in the Community: Intensive Outpatient Programs Organization         Address  Phone  Notes  [place]. [address], [place], [place] [phone]   [facility] Outpatient [address] [place], [place]   ADS: Alcohol & Drug Svcs [address] [place], [place]   [place] [address]  [place], [place] or [phone]   Substance Abuse Resources Organization         Address  Phone  Notes  Alcohol and Drug Services  [phone]   [place]  [phone]   The [place]   Chinita Pester  [phone]   Residential & Outpatient Substance Abuse Program  [phone]   Psychological Services Organization         Address  Phone  Notes  [facility]  [place]  [phone]   [place] [address] [place] or [phone]    Mobile Crisis Teams Organization         Address  Phone  Notes  [facility]atives, Mobile Crisis Care Unit  [phone]   Assertive Psychotherapeutic Services  [address] [place], [place]   [place] [address], [place] [phone]    Self-Help/Support Groups Organization         Address  Phone             Notes  [place]. of [name] - variety of support groups  [place] Call for more information  Narcotics Anonymous (NA), [facility]aring Services [address], [employer] [place]  2 meetings at this location   Special educational needs teacher         Address  Phone  Notes  ASAP Residential Treatment [place],    [place]  [phone]   [facility]  [address] [place] 712458, [place], [place]   [place], [place] [phone] Admissions: 8am-3pm M-F  Incentives Substance [place] [address]    [place], [place] [phone]   [facility] [address] Jadene Pierini [place], [place]   [facility] [address],  [place], [place]   [facility]ight Programs - Intensive Outpatient [address], [place], [place]   [facility] ([place].,  [place], [place] or [phone]   Residential Treatment Services (RTS) [address], [place], [place] Accepts Medicaid  [facility] [place] [address],  [place]  [phone] Substance Abuse/Addiction Treatment   [facility] Organization         Address  Phone  Notes  [facility]  [phone]   Domenic Schwab, PhD [address] Arlis Porta [place], [place]   [phone] or [phone]   [place], [place] [phone]   [address], [place], [place] [phone] Insurance/Medicaid/sponsorship through [facility] and Families [address]                                    [place], [place] [phone] [place] [address], [place] [phone]    Dr. Adele Schilder  [phone]   [facility] Dept. 1) [address] [place] 2) [place] 3)  [place] [address], [place] [phone]  [phone]   [place] [phone] or [phone] (After Hours)

## 2014-11-18 NOTE — Progress Notes (Signed)
EDCM spoke to patient at bedside with use of interpreter phone for Spanish speaking patient.  Patient confirms she does not have Medicaid insurance, also without a pcp.  [facility] provided patient with pamphlet to [facility].  Instructed patient to use walk in clinic at [facility] for follow if discharged.  Informed patient of services provided at [facility] and walk in clinic times.  Patient [facility].  Patient thankful for resources.  No further EDCM neds at this time.

## 2014-11-18 NOTE — ED Notes (Signed)
[facility]  Dx w/ pyelonephritis.  States she took all her abx but feels worse.  Rt sided flank pain.  Dysuria.  Nausea, no vomiting.  Diarrhea.

## 2014-12-03 ENCOUNTER — Emergency Department (HOSPITAL_COMMUNITY)
Admission: EM | Admit: 2014-12-03 | Discharge: 2014-12-03 | Disposition: A | Discharge status: Home / Self Care | Payer: Medicaid Other | Attending: Emergency Medicine | Admitting: Emergency Medicine

## 2014-12-03 ENCOUNTER — Encounter (HOSPITAL_COMMUNITY): Payer: Self-pay | Admitting: *Deleted

## 2014-12-03 DIAGNOSIS — N939 Abnormal uterine and vaginal bleeding, unspecified: Secondary | ICD-10-CM | POA: Insufficient documentation

## 2014-12-03 DIAGNOSIS — Z79899 Other long term (current) drug therapy: Secondary | ICD-10-CM | POA: Insufficient documentation

## 2014-12-03 DIAGNOSIS — Z9851 Tubal ligation status: Secondary | ICD-10-CM | POA: Insufficient documentation

## 2014-12-03 DIAGNOSIS — N390 Urinary tract infection, site not specified: Secondary | ICD-10-CM | POA: Insufficient documentation

## 2014-12-03 DIAGNOSIS — J45909 Unspecified asthma, uncomplicated: Secondary | ICD-10-CM | POA: Insufficient documentation

## 2014-12-03 DIAGNOSIS — Z8719 Personal history of other diseases of the digestive system: Secondary | ICD-10-CM | POA: Insufficient documentation

## 2014-12-03 LAB — CBC WITH DIFFERENTIAL/PLATELET
BASOS ABS: 0 10*3/uL (ref 0.0–0.1)
BASOS PCT: 1 % (ref 0–1)
Eosinophils Absolute: 0.1 10*3/uL (ref 0.0–0.7)
Eosinophils Relative: 2 % (ref 0–5)
HCT: 36.7 % (ref 36.0–46.0)
HEMOGLOBIN: 12.1 g/dL (ref 12.0–15.0)
Lymphocytes Relative: 38 % (ref 12–46)
Lymphs Abs: 2.3 10*3/uL (ref 0.7–4.0)
MCH: 28.5 pg (ref 26.0–34.0)
MCHC: 33 g/dL (ref 30.0–36.0)
MCV: 86.4 fL (ref 78.0–100.0)
MONOS PCT: 4 % (ref 3–12)
Monocytes Absolute: 0.3 10*3/uL (ref 0.1–1.0)
NEUTROS PCT: 55 % (ref 43–77)
Neutro Abs: 3.5 10*3/uL (ref 1.7–7.7)
PLATELETS: 280 10*3/uL (ref 150–400)
RBC: 4.25 MIL/uL (ref 3.87–5.11)
RDW: 13.7 % (ref 11.5–15.5)
WBC: 6.2 10*3/uL (ref 4.0–10.5)

## 2014-12-03 LAB — COMPREHENSIVE METABOLIC PANEL
ALT: 56 U/L — ABNORMAL HIGH (ref 0–35)
AST: 40 U/L — ABNORMAL HIGH (ref 0–37)
Albumin: 3.9 g/dL (ref 3.5–5.2)
Alkaline Phosphatase: 99 U/L (ref 39–117)
Anion gap: 7 (ref 5–15)
BUN: 9 mg/dL (ref 6–23)
CO2: 25 mmol/L (ref 19–32)
Calcium: 9 mg/dL (ref 8.4–10.5)
Chloride: 107 mmol/L (ref 96–112)
Creatinine, Ser: 0.69 mg/dL (ref 0.50–1.10)
GFR calc Af Amer: >90 mL/min (ref >90)
GLUCOSE: 118 mg/dL — AB (ref 70–99)
POTASSIUM: 3.5 mmol/L (ref 3.5–5.1)
SODIUM: 139 mmol/L (ref 135–145)
Total Bilirubin: 0.4 mg/dL (ref 0.3–1.2)
Total Protein: 7 g/dL (ref 6.0–8.3)

## 2014-12-03 LAB — URINE MICROSCOPIC-ADD ON

## 2014-12-03 LAB — WET PREP, GENITAL
CLUE CELLS WET PREP: NONE SEEN
Trich, Wet Prep: NONE SEEN
WBC, Wet Prep HPF POC: NONE SEEN
Yeast Wet Prep HPF POC: NONE SEEN

## 2014-12-03 LAB — URINALYSIS, ROUTINE W REFLEX MICROSCOPIC
BILIRUBIN URINE: NEGATIVE
Glucose, UA: NEGATIVE mg/dL
Hgb urine dipstick: NEGATIVE
KETONES UR: NEGATIVE mg/dL
Leukocytes, UA: NEGATIVE
Nitrite: POSITIVE — AB
PH: 6.5 (ref 5.0–8.0)
Protein, ur: NEGATIVE mg/dL
SPECIFIC GRAVITY, URINE: 1.021 (ref 1.005–1.030)
UROBILINOGEN UA: 0.2 mg/dL (ref 0.0–1.0)

## 2014-12-03 LAB — I-STAT BETA HCG BLOOD, ED (MC, WL, AP ONLY): I-stat hCG, quantitative: <5 m[IU]/mL (ref <5)

## 2014-12-03 MED ORDER — OXYCODONE-ACETAMINOPHEN 5-325 MG PO TABS
1.0000 | ORAL_TABLET | Freq: Once | ORAL | Status: AC
Start: 2014-12-03 — End: 2014-12-03
  Administered 2014-12-03: 1 via ORAL
  Filled 2014-12-03: qty 1

## 2014-12-03 MED ORDER — CEPHALEXIN 500 MG PO CAPS: 500.0000 mg | ORAL_CAPSULE | Freq: Three times a day (TID) | ORAL | Status: DC

## 2014-12-03 NOTE — Discharge Instructions (Signed)
Take the prescribed medication as directed. [facility]. Return to the ED for new or worsening symptoms.

## 2014-12-03 NOTE — ED Provider Notes (Signed)
CSN: [id number]     Arrival date & time 12/03/14  1252 History   First MD Initiated Contact with Patient 12/03/14 1557     Chief Complaint  Patient presents with  . Vaginal Bleeding     (Consider location/radiation/quality/duration/timing/severity/associated sxs/prior Treatment) Patient is a 29 y.o. female presenting with vaginal bleeding. The history is provided by the patient and medical records. The history is limited by a language barrier. A language interpreter was used.  Vaginal Bleeding Associated symptoms: abdominal pain     This is a 29 y.o. F with PMH significant for diverticulitis, asthma, presenting to the ED for lower abdominal pain and vaginal bleeding.  Patient states pain is a cramping with intermittent sharp stabbing pains in her suprapubic region.  States this is the first time she has had vaginal bleeding since the birth of her last child approx 1 year ago.  She states currently symptoms are somewhat worse than her prior menstrual cycles.  She also notes some dysuria.  She denies vaginal discharge.  States after last birth she thinks she had a tubal ligation but is not entirely sure.  She is not using any other type of contraception.  States bowel movements have been regular.  No fever, chills, sweats.  VSS.  Past Medical History  Diagnosis Date  . Diverticulitis   . Pyelonephritis   . Pyelonephritis   . Asthma    Past Surgical History  Procedure Laterality Date  . Cholecystectomy    . Cesarean section    . Cesarean section N/A 2006  . Cesarean section N/A 09/03/2014    Procedure: CESAREAN SECTION;  Surgeon: Guss Bunde, MD;  Location: [facility];  Service: Obstetrics;  Laterality: N/A;  . Tubal ligation     Family History  Problem Relation Age of Onset  . Hyperlipidemia Mother   . Diabetes Maternal Uncle   . Diabetes Paternal Grandmother    History  Substance Use Topics  . Smoking status: Never Smoker   . Smokeless tobacco: Never Used  . Alcohol Use: No    OB History    Gravida Para Term Preterm AB TAB SAB Ectopic Multiple Living   5 3 1 2 2  2  1 2      Review of Systems  Gastrointestinal: Positive for abdominal pain.  Genitourinary: Positive for vaginal bleeding.  All other systems reviewed and are negative.     Allergies  Review of patient's allergies indicates no known allergies.  Home Medications   Prior to Admission medications   Medication Sig Start Date End Date Taking? Authorizing Provider  acetaminophen (TYLENOL) 500 MG tablet Take 1 tablet (500 mg total) by mouth every 6 (six) hours as needed (pain). 11/01/14   Simbiso Ranga, MD  albuterol (PROVENTIL HFA;VENTOLIN HFA) 108 (90 BASE) MCG/ACT inhaler Inhale 4 puffs into the lungs every 6 (six) hours as needed for wheezing or shortness of breath. 08/13/14   Mariel Aloe, MD  ibuprofen (ADVIL,MOTRIN) 600 MG tablet Take 1 tablet (600 mg total) by mouth every 6 (six) hours. Patient taking differently: Take 600 mg by mouth every 6 (six) hours as needed for moderate pain (pain).  09/06/14   Lattie Haw A Leftwich-Kirby, CNM  levofloxacin (LEVAQUIN) 750 MG tablet Take 1 tablet (750 mg total) by mouth daily. X 7 days 11/18/14   Jarrett Soho Muthersbaugh, PA-C  oxyCODONE-acetaminophen (PERCOCET/ROXICET) 5-325 MG per tablet Take 1-2 tablets by mouth every 4 (four) hours as needed for severe pain. 11/18/14   Jarrett Soho Muthersbaugh, PA-C  polyethylene glycol powder (GLYCOLAX/MIRALAX) powder Take 17 g by mouth daily. Patient taking differently: Take 17 g by mouth daily as needed for mild constipation (constipation).  11/04/14   Gregor Hams, MD   BP 135/93 mmHg  Pulse 79  Temp(Src) 98.5 F (36.9 C) (Oral)  Resp 18  SpO2 99%  LMP 11/28/2014   Physical Exam  Constitutional: She is oriented to person, place, and time. She appears well-developed and well-nourished. No distress.  HENT:  Head: Normocephalic and atraumatic.  Mouth/Throat: Oropharynx is clear and moist.  Eyes: Conjunctivae and EOM are  normal. Pupils are equal, round, and reactive to light.  Neck: Normal range of motion.  Cardiovascular: Normal rate, regular rhythm and normal heart sounds.   Pulmonary/Chest: Effort normal and breath sounds normal. No respiratory distress. She has no wheezes.  Abdominal: Soft. Bowel sounds are normal. There is tenderness in the suprapubic area. There is no guarding and no CVA tenderness.  Abdomen soft, non-distended, mild tenderness in her suprapubic region, no rebound or guarding  Genitourinary: No erythema or tenderness in the vagina. Bleeding: mild. No foreign body around the vagina. No vaginal discharge found.  Normal female external genitalia without visible lesions; small amount of blood noted in vaginal vault without clots or membranous tissue; cervical os closed without friability; no appreciable discharge; no adnexal or CMT  Musculoskeletal: Normal range of motion.  Neurological: She is alert and oriented to person, place, and time.  Skin: Skin is warm and dry. She is not diaphoretic.  Psychiatric: She has a normal mood and affect.  Nursing note and vitals reviewed.   ED Course  Procedures (including critical care time) Labs Review Labs Reviewed  COMPREHENSIVE METABOLIC PANEL - Abnormal; Notable for the following:    Glucose, Bld 118 (*)    AST 40 (*)    ALT 56 (*)    All other components within normal limits  URINALYSIS, ROUTINE W REFLEX MICROSCOPIC - Abnormal; Notable for the following:    APPearance CLOUDY (*)    Nitrite POSITIVE (*)    All other components within normal limits  URINE MICROSCOPIC-ADD ON - Abnormal; Notable for the following:    Bacteria, UA MANY (*)    All other components within normal limits  WET PREP, GENITAL  CBC WITH DIFFERENTIAL/PLATELET  I-STAT BETA HCG BLOOD, ED ([facility], [facility], [facility] ONLY)  GC/CHLAMYDIA PROBE AMP ([place])    Imaging Review No results found.   EKG Interpretation None      MDM   Final diagnoses:  Vaginal bleeding  UTI  (lower urinary tract infection)   29 year old female with lower abdominal pain and vaginal bleeding. On exam, patient afebrile and nontoxic in appearance. She does have some mild tenderness in her suprapubic region correlating with her reported dysuria. Pelvic exam was performed, mild amount of blood in vaginal vault without clots or membranous tissue. Cervical os is closed and appears normal. There is no adnexal or cervical motion tenderness. Vaginal bleeding is mostly likely from her resumed menstrual cycle. Lab work is overall reassuring, preg test negative. UA nitrite positive.  No flank pain or fever to suggest pyelonephritis or infected stone.  Will treat with keflex.  Patient given [facility]U at [facility] clinic.  Discussed plan with patient via language interpreter, she acknowledged understanding and agreed with plan of care.  Return precautions given for new or worsening symptoms.  Larene Pickett, PA-C 12/03/14 2127  Tanna Furry, MD 12/04/14 2221

## 2014-12-03 NOTE — ED Notes (Signed)
Pt reports lower abd pain with moderate vaginal bleeding x 6 days. Last menstrual was one year ago due to recent pregnancy and breastfeeding. No acute distress noted at this time.

## 2014-12-06 LAB — GC/CHLAMYDIA PROBE AMP (~~LOC~~) NOT AT ARMC
Chlamydia: NEGATIVE
Neisseria Gonorrhea: NEGATIVE

## 2014-12-19 ENCOUNTER — Emergency Department (HOSPITAL_COMMUNITY)
Admission: EM | Admit: 2014-12-19 | Discharge: 2014-12-19 | Disposition: A | Discharge status: Home / Self Care | Payer: Medicaid Other | Attending: Emergency Medicine | Admitting: Emergency Medicine

## 2014-12-19 ENCOUNTER — Encounter (HOSPITAL_COMMUNITY): Payer: Self-pay

## 2014-12-19 DIAGNOSIS — Z79899 Other long term (current) drug therapy: Secondary | ICD-10-CM | POA: Diagnosis not present

## 2014-12-19 DIAGNOSIS — J45909 Unspecified asthma, uncomplicated: Secondary | ICD-10-CM | POA: Diagnosis not present

## 2014-12-19 DIAGNOSIS — R1030 Lower abdominal pain, unspecified: Secondary | ICD-10-CM | POA: Diagnosis present

## 2014-12-19 DIAGNOSIS — Z8719 Personal history of other diseases of the digestive system: Secondary | ICD-10-CM | POA: Diagnosis not present

## 2014-12-19 DIAGNOSIS — Z3202 Encounter for pregnancy test, result negative: Secondary | ICD-10-CM | POA: Diagnosis not present

## 2014-12-19 DIAGNOSIS — N12 Tubulo-interstitial nephritis, not specified as acute or chronic: Secondary | ICD-10-CM | POA: Insufficient documentation

## 2014-12-19 DIAGNOSIS — Z792 Long term (current) use of antibiotics: Secondary | ICD-10-CM | POA: Insufficient documentation

## 2014-12-19 LAB — CBC WITH DIFFERENTIAL/PLATELET
BASOS ABS: 0 10*3/uL (ref 0.0–0.1)
BASOS PCT: 0 % (ref 0–1)
EOS PCT: 1 % (ref 0–5)
Eosinophils Absolute: 0.2 10*3/uL (ref 0.0–0.7)
HCT: 35.5 % — ABNORMAL LOW (ref 36.0–46.0)
Hemoglobin: 11.8 g/dL — ABNORMAL LOW (ref 12.0–15.0)
Lymphocytes Relative: 18 % (ref 12–46)
Lymphs Abs: 2.3 10*3/uL (ref 0.7–4.0)
MCH: 28.4 pg (ref 26.0–34.0)
MCHC: 33.2 g/dL (ref 30.0–36.0)
MCV: 85.5 fL (ref 78.0–100.0)
Monocytes Absolute: 0.9 10*3/uL (ref 0.1–1.0)
Monocytes Relative: 7 % (ref 3–12)
NEUTROS ABS: 9.7 10*3/uL — AB (ref 1.7–7.7)
Neutrophils Relative %: 74 % (ref 43–77)
Platelets: 276 10*3/uL (ref 150–400)
RBC: 4.15 MIL/uL (ref 3.87–5.11)
RDW: 14.3 % (ref 11.5–15.5)
WBC: 13.1 10*3/uL — ABNORMAL HIGH (ref 4.0–10.5)

## 2014-12-19 LAB — BASIC METABOLIC PANEL
Anion gap: 6 (ref 5–15)
BUN: 10 mg/dL (ref 6–23)
CO2: 23 mmol/L (ref 19–32)
Calcium: 8.7 mg/dL (ref 8.4–10.5)
Chloride: 106 mmol/L (ref 96–112)
Creatinine, Ser: 0.87 mg/dL (ref 0.50–1.10)
GFR calc Af Amer: >90 mL/min (ref >90)
GFR calc non Af Amer: 90 mL/min — ABNORMAL LOW (ref >90)
Glucose, Bld: 101 mg/dL — ABNORMAL HIGH (ref 70–99)
POTASSIUM: 3.1 mmol/L — AB (ref 3.5–5.1)
Sodium: 135 mmol/L (ref 135–145)

## 2014-12-19 LAB — URINALYSIS, ROUTINE W REFLEX MICROSCOPIC
BILIRUBIN URINE: NEGATIVE
Glucose, UA: NEGATIVE mg/dL
Hgb urine dipstick: NEGATIVE
KETONES UR: 15 mg/dL — AB
Nitrite: NEGATIVE
Protein, ur: NEGATIVE mg/dL
SPECIFIC GRAVITY, URINE: 1.019 (ref 1.005–1.030)
UROBILINOGEN UA: 0.2 mg/dL (ref 0.0–1.0)
pH: 6 (ref 5.0–8.0)

## 2014-12-19 LAB — WET PREP, GENITAL
TRICH WET PREP: NONE SEEN
WBC WET PREP: NONE SEEN
YEAST WET PREP: NONE SEEN

## 2014-12-19 LAB — URINE MICROSCOPIC-ADD ON

## 2014-12-19 LAB — POC URINE PREG, ED: PREG TEST UR: NEGATIVE

## 2014-12-19 MED ORDER — ONDANSETRON HCL 4 MG/2ML IJ SOLN
4.0000 mg | Freq: Once | INTRAMUSCULAR | Status: AC
  Administered 2014-12-19: 4 mg via INTRAVENOUS
  Filled 2014-12-19: qty 2

## 2014-12-19 MED ORDER — CEFTRIAXONE SODIUM 1 G IJ SOLR
1.0000 g | Freq: Once | INTRAMUSCULAR | Status: AC
  Administered 2014-12-19: 1 g via INTRAVENOUS
  Filled 2014-12-19: qty 10

## 2014-12-19 MED ORDER — MORPHINE SULFATE 4 MG/ML IJ SOLN
4.0000 mg | Freq: Once | INTRAMUSCULAR | Status: AC
  Administered 2014-12-19: 4 mg via INTRAVENOUS
  Filled 2014-12-19: qty 1

## 2014-12-19 MED ORDER — OXYCODONE-ACETAMINOPHEN 5-325 MG PO TABS
2.0000 | ORAL_TABLET | Freq: Once | ORAL | Status: AC
  Administered 2014-12-19: 2 via ORAL
  Filled 2014-12-19: qty 2

## 2014-12-19 MED ORDER — OXYCODONE-ACETAMINOPHEN 5-325 MG PO TABS: 1.0000 | ORAL_TABLET | Freq: Four times a day (QID) | ORAL | Status: DC | PRN

## 2014-12-19 MED ORDER — POTASSIUM CHLORIDE CRYS ER 20 MEQ PO TBCR
40.0000 meq | EXTENDED_RELEASE_TABLET | Freq: Once | ORAL | Status: AC
  Administered 2014-12-19: 40 meq via ORAL
  Filled 2014-12-19: qty 2

## 2014-12-19 MED ORDER — KETOROLAC TROMETHAMINE 30 MG/ML IJ SOLN
30.0000 mg | Freq: Once | INTRAMUSCULAR | Status: AC
  Administered 2014-12-19: 30 mg via INTRAVENOUS
  Filled 2014-12-19: qty 1

## 2014-12-19 MED ORDER — ONDANSETRON HCL 4 MG PO TABS: 4.0000 mg | ORAL_TABLET | Freq: Four times a day (QID) | ORAL | Status: DC

## 2014-12-19 NOTE — ED Provider Notes (Signed)
CSN: [id number]     Arrival date & time 12/19/14  0804 History   First MD Initiated Contact with Patient 12/19/14 [phone]     Chief Complaint  Patient presents with  . Flank Pain     (Consider location/radiation/quality/duration/timing/severity/associated sxs/prior Treatment) HPI Comments: Patient presents with bilateral flank pain and dysuria for the past 2 weeks.  Symptoms gradually worsening.  She also reports associated increased urinary frequency.  She reports that the flank pain does not radiate.  She reports associated urinary urgency and frequency.  Denies hematuria.  She reports that she has a fever last evening, but never checked her temperature with a thermometer.  She reports associated nausea, but denies vomiting or diarrhea.  Denies vaginal discharge.  She has not taken anything for symptoms prior to arrival.  She reports history of UTI and Pyelonephritis and reports that pain feels similar.    Patient is a 29 y.o. female presenting with flank pain. The history is provided by the patient. A language interpreter was used (medical phone interpretor used).  Flank Pain    Past Medical History  Diagnosis Date  . Diverticulitis   . Pyelonephritis   . Pyelonephritis   . Asthma    Past Surgical History  Procedure Laterality Date  . Cholecystectomy    . Cesarean section    . Cesarean section N/A 2006  . Cesarean section N/A 09/03/2014    Procedure: CESAREAN SECTION;  Surgeon: Guss Bunde, MD;  Location: [facility];  Service: Obstetrics;  Laterality: N/A;  . Tubal ligation     Family History  Problem Relation Age of Onset  . Hyperlipidemia Mother   . Diabetes Maternal Uncle   . Diabetes Paternal Grandmother    History  Substance Use Topics  . Smoking status: Never Smoker   . Smokeless tobacco: Never Used  . Alcohol Use: No   OB History    Gravida Para Term Preterm AB TAB SAB Ectopic Multiple Living   5 3 1 2 2  2  1 2      Review of Systems  Genitourinary: Positive  for flank pain.  All other systems reviewed and are negative.     Allergies  Review of patient's allergies indicates no known allergies.  Home Medications   Prior to Admission medications   Medication Sig Start Date End Date Taking? Authorizing Provider  acetaminophen (TYLENOL) 500 MG tablet Take 1 tablet (500 mg total) by mouth every 6 (six) hours as needed (pain). 11/01/14   Simbiso Ranga, MD  albuterol (PROVENTIL HFA;VENTOLIN HFA) 108 (90 BASE) MCG/ACT inhaler Inhale 4 puffs into the lungs every 6 (six) hours as needed for wheezing or shortness of breath. 08/13/14   Mariel Aloe, MD  cephALEXin (KEFLEX) 500 MG capsule Take 1 capsule (500 mg total) by mouth 3 (three) times daily. 12/03/14   Larene Pickett, PA-C  ibuprofen (ADVIL,MOTRIN) 600 MG tablet Take 1 tablet (600 mg total) by mouth every 6 (six) hours. Patient taking differently: Take 600 mg by mouth every 6 (six) hours as needed for moderate pain (pain).  09/06/14   Lattie Haw A Leftwich-Kirby, CNM  levofloxacin (LEVAQUIN) 750 MG tablet Take 1 tablet (750 mg total) by mouth daily. X 7 days 11/18/14   Jarrett Soho Muthersbaugh, PA-C  oxyCODONE-acetaminophen (PERCOCET/ROXICET) 5-325 MG per tablet Take 1-2 tablets by mouth every 4 (four) hours as needed for severe pain. 11/18/14   Hannah Muthersbaugh, PA-C  polyethylene glycol powder (GLYCOLAX/MIRALAX) powder Take 17 g by mouth daily. Patient  taking differently: Take 17 g by mouth daily as needed for mild constipation (constipation).  11/04/14   Gregor Hams, MD   BP 122/77 mmHg  Pulse 108  Temp(Src) 97.6 F (36.4 C) (Oral)  Resp 22  SpO2 97%  LMP 11/28/2014 Physical Exam  Constitutional: She appears well-developed and well-nourished.  HENT:  Head: Normocephalic and atraumatic.  Mouth/Throat: Oropharynx is clear and moist.  Neck: Normal range of motion. Neck supple.  Cardiovascular: Normal rate, regular rhythm and normal heart sounds.   Pulmonary/Chest: Effort normal and breath sounds  normal.  Abdominal: Soft. Bowel sounds are normal. She exhibits no distension and no mass. There is tenderness in the suprapubic area. There is CVA tenderness. There is no rebound and no guarding.  Bilateral CVA tenderness  Genitourinary: Cervix exhibits no motion tenderness, no discharge and no friability. Right adnexum displays no mass, no tenderness and no fullness. Left adnexum displays no mass, no tenderness and no fullness.  Musculoskeletal: Normal range of motion.  Neurological: She is alert.  Skin: Skin is warm and dry.  Psychiatric: She has a normal mood and affect.  Nursing note and vitals reviewed.   ED Course  Procedures (including critical care time) Labs Review Labs Reviewed  URINALYSIS, ROUTINE W REFLEX MICROSCOPIC  CBC WITH DIFFERENTIAL/PLATELET  BASIC METABOLIC PANEL  POC URINE PREG, ED    Imaging Review No results found.   EKG Interpretation None     11:52 AM Discussed results with the patient using the language interpretor phone.  Patient appears stable for discharge.  However, she now reports vaginal discharge, which she initially denied.  Patient offered pelvic and reports that she would like to have the pelvic performed.  1:35 PM Patient is now actively vomiting in the ED.  Will order Zofran and reassess. 2:18 PM Patient reports mild improvement in nausea.  Will order one more dose of Zofran and then will reassess and fluid challenge.  3:25 PM Patient tolerating PO liquids. MDM   Final diagnoses:  None   Patient with a history of Pyelonephritis presents today with nausea, vomiting, bilateral flank pain, and urinary symptoms.  UA showing possible UTI.  Urine cultured.  Patient given iV Rocephin in the ED.  Previous urine cultures reviewed and showed sensitivity to Cipro.  Patient started on Cipro.  Labs unremarkable.  Urine pregnancy negative.  Nausea controlled in the ED.  Patient able to tolerate PO liquids.  Patient has been seen in the ED for similar  presentation in the past.  Flank pain was thought to be chronic at that time.  Pelvic exam showing no abnormal discharge.  No CMT or adnexal tenderness.   Feel that the patient is stable for discharge.  Patient again instructed to follow up with PCP.  Return precautions given.   Hyman Bible, PA-C 12/20/14 2132  Blanchie Dessert, MD 12/23/14 [phone]

## 2014-12-19 NOTE — ED Notes (Signed)
bilat flank pain with UTI symptoms for 2 weeks, nausea no vomiting, subjective fever last night per interpreter from language line

## 2014-12-19 NOTE — ED Notes (Signed)
Ambulated to bathroom without difficulty

## 2014-12-19 NOTE — ED Notes (Signed)
Pt is actively vomiting

## 2014-12-19 NOTE — ED Notes (Signed)
Pt reports left flank pain with dysuria x 2 weeks, progressively worsening. Reports subjective fever last night. NAD.

## 2014-12-19 NOTE — Discharge Instructions (Signed)
Pielonefritis - Adultos  (Pyelonephritis, Adult)  La pielonefritis es una infeccin del rin. Puede curarse rpidamente o durar [employer].  CUIDADOS EN EL HOGAR   Tome los medicamentos (antibiticos) tal como se le indic. Finalice la prescripcin completa, aunque se sienta mejor.  Cumpla con los controles mdicos segn las indicaciones.  Beba gran cantidad de lquido para mantener la orina de tono claro o color amarillo plido.  Slo tome la medicacin segn las indicaciones. SOLICITE AYUDA DE INMEDIATO SI:   Tiene fiebre o sntomas que persisten durante ms de 2-3 das.  Tiene fiebre y los sntomas empeoran.  No puede tomar los medicamentos o beber lquidos segn las indicaciones.  Siente escalofros o tiene fiebre.  Se siente dbil o se desvanece (se desmaya).  No mejora luego de 2 das. ASEGRESE DE QUE:   Comprende estas instrucciones.  Controlar su enfermedad.  Solicitar ayuda de inmediato si no mejora o empeora. Document Released: 09/03/2005 Document Revised: 03/04/2012 [facility] Patient Information 2015 [place], [place]. This information is not intended to replace advice given to you by your health care provider. Make sure you discuss any questions you have with your health care provider.

## 2014-12-20 LAB — URINE CULTURE: Colony Count: >=100000

## 2014-12-20 LAB — GC/CHLAMYDIA PROBE AMP (~~LOC~~) NOT AT ARMC
Chlamydia: NEGATIVE
NEISSERIA GONORRHEA: NEGATIVE

## 2014-12-28 ENCOUNTER — Inpatient Hospital Stay (HOSPITAL_COMMUNITY)
Admission: AD | Admit: 2014-12-28 | Discharge: 2014-12-28 | Discharge status: Against Medical Advice | Payer: Medicaid Other | Source: Ambulatory Visit | Attending: Family Medicine | Admitting: Family Medicine

## 2014-12-28 ENCOUNTER — Encounter (HOSPITAL_COMMUNITY): Payer: Self-pay | Admitting: Student

## 2014-12-28 DIAGNOSIS — R1030 Lower abdominal pain, unspecified: Secondary | ICD-10-CM | POA: Insufficient documentation

## 2014-12-28 DIAGNOSIS — N939 Abnormal uterine and vaginal bleeding, unspecified: Secondary | ICD-10-CM | POA: Insufficient documentation

## 2014-12-28 DIAGNOSIS — M545 Low back pain: Secondary | ICD-10-CM | POA: Insufficient documentation

## 2014-12-28 LAB — URINALYSIS, ROUTINE W REFLEX MICROSCOPIC
Bilirubin Urine: NEGATIVE
Glucose, UA: NEGATIVE mg/dL
KETONES UR: NEGATIVE mg/dL
Nitrite: NEGATIVE
PH: 5.5 (ref 5.0–8.0)
Protein, ur: NEGATIVE mg/dL
Specific Gravity, Urine: 1.02 (ref 1.005–1.030)
Urobilinogen, UA: 0.2 mg/dL (ref 0.0–1.0)

## 2014-12-28 LAB — URINE MICROSCOPIC-ADD ON

## 2014-12-28 LAB — CBC WITH DIFFERENTIAL/PLATELET
BASOS PCT: 0 % (ref 0–1)
Basophils Absolute: 0 10*3/uL (ref 0.0–0.1)
EOS PCT: 3 % (ref 0–5)
Eosinophils Absolute: 0.2 10*3/uL (ref 0.0–0.7)
HEMATOCRIT: 35.8 % — AB (ref 36.0–46.0)
HEMOGLOBIN: 11.8 g/dL — AB (ref 12.0–15.0)
LYMPHS ABS: 2.9 10*3/uL (ref 0.7–4.0)
Lymphocytes Relative: 30 % (ref 12–46)
MCH: 28.2 pg (ref 26.0–34.0)
MCHC: 33 g/dL (ref 30.0–36.0)
MCV: 85.4 fL (ref 78.0–100.0)
MONO ABS: 0.5 10*3/uL (ref 0.1–1.0)
Monocytes Relative: 5 % (ref 3–12)
NEUTROS PCT: 63 % (ref 43–77)
Neutro Abs: 6.1 10*3/uL (ref 1.7–7.7)
Platelets: 315 10*3/uL (ref 150–400)
RBC: 4.19 MIL/uL (ref 3.87–5.11)
RDW: 13.7 % (ref 11.5–15.5)
WBC: 9.7 10*3/uL (ref 4.0–10.5)

## 2014-12-28 LAB — POCT PREGNANCY, URINE: Preg Test, Ur: NEGATIVE

## 2014-12-28 NOTE — MAU Note (Signed)
Pt not in waiting room when called to be seen.

## 2014-12-28 NOTE — MAU Note (Signed)
Not in lobby

## 2014-12-28 NOTE — MAU Note (Signed)
Pt reports she has been bleeding for 3 days, reports lower abd and lower back pain x 3 days. Some dysuria. Pt reports she had a baby in December and did not have a period until this month and she has had bleeding x 2 this month. She was seen at [facility] ED 2 weeks ago and they told her to follow up here if she continued to bleed.

## 2014-12-28 NOTE — MAU Note (Signed)
Pt not in lobby or in other waiting rooms.

## 2015-02-05 ENCOUNTER — Encounter (HOSPITAL_COMMUNITY): Payer: Self-pay | Admitting: Emergency Medicine

## 2015-02-05 ENCOUNTER — Emergency Department (HOSPITAL_COMMUNITY): Payer: Medicaid Other

## 2015-02-05 ENCOUNTER — Emergency Department (HOSPITAL_COMMUNITY)
Admission: EM | Admit: 2015-02-05 | Discharge: 2015-02-05 | Disposition: A | Discharge status: Home / Self Care | Payer: Medicaid Other | Attending: Emergency Medicine | Admitting: Emergency Medicine

## 2015-02-05 DIAGNOSIS — J45901 Unspecified asthma with (acute) exacerbation: Secondary | ICD-10-CM | POA: Insufficient documentation

## 2015-02-05 DIAGNOSIS — Z79899 Other long term (current) drug therapy: Secondary | ICD-10-CM | POA: Insufficient documentation

## 2015-02-05 DIAGNOSIS — Z8719 Personal history of other diseases of the digestive system: Secondary | ICD-10-CM | POA: Insufficient documentation

## 2015-02-05 DIAGNOSIS — R0789 Other chest pain: Secondary | ICD-10-CM | POA: Insufficient documentation

## 2015-02-05 DIAGNOSIS — Z87448 Personal history of other diseases of urinary system: Secondary | ICD-10-CM | POA: Insufficient documentation

## 2015-02-05 LAB — I-STAT CHEM 8, ED
BUN: 18 mg/dL (ref 6–20)
Calcium, Ion: 1.22 mmol/L (ref 1.12–1.23)
Chloride: 107 mmol/L (ref 101–111)
Creatinine, Ser: 0.8 mg/dL (ref 0.44–1.00)
GLUCOSE: 103 mg/dL — AB (ref 65–99)
HEMATOCRIT: 37 % (ref 36.0–46.0)
Hemoglobin: 12.6 g/dL (ref 12.0–15.0)
Potassium: 3.6 mmol/L (ref 3.5–5.1)
SODIUM: 143 mmol/L (ref 135–145)
TCO2: 23 mmol/L (ref 0–100)

## 2015-02-05 LAB — CBC WITH DIFFERENTIAL/PLATELET
BASOS ABS: 0 10*3/uL (ref 0.0–0.1)
Basophils Relative: 0 % (ref 0–1)
EOS PCT: 2 % (ref 0–5)
Eosinophils Absolute: 0.2 10*3/uL (ref 0.0–0.7)
HCT: 36.3 % (ref 36.0–46.0)
HEMOGLOBIN: 11.8 g/dL — AB (ref 12.0–15.0)
Lymphocytes Relative: 39 % (ref 12–46)
Lymphs Abs: 3.4 10*3/uL (ref 0.7–4.0)
MCH: 27.9 pg (ref 26.0–34.0)
MCHC: 32.5 g/dL (ref 30.0–36.0)
MCV: 85.8 fL (ref 78.0–100.0)
MONOS PCT: 5 % (ref 3–12)
Monocytes Absolute: 0.4 10*3/uL (ref 0.1–1.0)
NEUTROS PCT: 54 % (ref 43–77)
Neutro Abs: 4.7 10*3/uL (ref 1.7–7.7)
PLATELETS: 299 10*3/uL (ref 150–400)
RBC: 4.23 MIL/uL (ref 3.87–5.11)
RDW: 13.6 % (ref 11.5–15.5)
WBC: 8.7 10*3/uL (ref 4.0–10.5)

## 2015-02-05 LAB — I-STAT TROPONIN, ED: Troponin i, poc: 0 ng/mL (ref 0.00–0.08)

## 2015-02-05 MED ORDER — KETOROLAC TROMETHAMINE 30 MG/ML IJ SOLN
30.0000 mg | Freq: Once | INTRAMUSCULAR | Status: AC
  Administered 2015-02-05: 30 mg via INTRAMUSCULAR
  Filled 2015-02-05: qty 1

## 2015-02-05 MED ORDER — PREDNISONE 20 MG PO TABS
60.0000 mg | ORAL_TABLET | ORAL | Status: AC
  Administered 2015-02-05: 60 mg via ORAL
  Filled 2015-02-05: qty 3

## 2015-02-05 MED ORDER — PREDNISONE 20 MG PO TABS: 40.0000 mg | ORAL_TABLET | Freq: Every day | ORAL | Status: AC

## 2015-02-05 MED ORDER — ALBUTEROL SULFATE HFA 108 (90 BASE) MCG/ACT IN AERS
1.0000 | INHALATION_SPRAY | RESPIRATORY_TRACT | Status: DC
  Administered 2015-02-05: 1 via RESPIRATORY_TRACT
  Filled 2015-02-05: qty 6.7

## 2015-02-05 NOTE — ED Provider Notes (Signed)
CSN: [id number]     Arrival date & time 02/05/15  2115 History   First MD Initiated Contact with Patient 02/05/15 2234     Chief Complaint  Patient presents with  . Chest Pain    HPI  Patient presents with concern of chest pain, mild dyspnea. Symptoms began about one week ago, have been intermittent, occurring without clear precipitant. Symptoms do seem to occur more often at night, when she is sleeping, and she actually states that there is no pain with exertion, no dyspnea with exertion. Pain is focally about the left upper chest, pinching, with occasional radiation to the left shoulder. No lightheadedness, syncope, nausea, vomiting. Patient was well prior to onset of symptoms. She acknowledges a history of asthma, diagnosed during recent pregnancy. Her child is 73 months old. During this episode, no medication taken for pain relief. No ongoing fever, chills. No cough. Patient does not smoke.   Past Medical History  Diagnosis Date  . Diverticulitis   . Pyelonephritis   . Pyelonephritis   . Asthma    Past Surgical History  Procedure Laterality Date  . Cholecystectomy    . Cesarean section    . Cesarean section N/A 2006  . Cesarean section N/A 09/03/2014    Procedure: CESAREAN SECTION;  Surgeon: Guss Bunde, MD;  Location: [facility];  Service: Obstetrics;  Laterality: N/A;  . Tubal ligation     Family History  Problem Relation Age of Onset  . Hyperlipidemia Mother   . Diabetes Maternal Uncle   . Diabetes Paternal Grandmother    History  Substance Use Topics  . Smoking status: Never Smoker   . Smokeless tobacco: Never Used  . Alcohol Use: No   OB History    Gravida Para Term Preterm AB TAB SAB Ectopic Multiple Living   5 3 1 2 2  2  1 2      Review of Systems  Constitutional:       Per HPI, otherwise negative  HENT:       Per HPI, otherwise negative  Respiratory:       Per HPI, otherwise negative  Cardiovascular:       Per HPI, otherwise negative   Gastrointestinal: Negative for vomiting.  Endocrine:       Negative aside from HPI  Genitourinary:       Neg aside from HPI   Musculoskeletal:       Per HPI, otherwise negative  Skin: Negative.   Neurological: Negative for syncope.      Allergies  Review of patient's allergies indicates no known allergies.  Home Medications   Prior to Admission medications   Medication Sig Start Date End Date Taking? Authorizing Provider  acetaminophen (TYLENOL) 500 MG tablet Take 1 tablet (500 mg total) by mouth every 6 (six) hours as needed (pain). 11/01/14   Simbiso Ranga, MD  albuterol (PROVENTIL HFA;VENTOLIN HFA) 108 (90 BASE) MCG/ACT inhaler Inhale 4 puffs into the lungs every 6 (six) hours as needed for wheezing or shortness of breath. 08/13/14   Mariel Aloe, MD  cephALEXin (KEFLEX) 500 MG capsule Take 1 capsule (500 mg total) by mouth 3 (three) times daily. Patient not taking: Reported on 12/19/2014 12/03/14   Larene Pickett, PA-C  ibuprofen (ADVIL,MOTRIN) 600 MG tablet Take 1 tablet (600 mg total) by mouth every 6 (six) hours. Patient taking differently: Take 600 mg by mouth every 6 (six) hours as needed for moderate pain (pain).  09/06/14   Elvera Maria,  CNM  levofloxacin (LEVAQUIN) 750 MG tablet Take 1 tablet (750 mg total) by mouth daily. X 7 days Patient not taking: Reported on 12/19/2014 11/18/14   Jarrett Soho Muthersbaugh, PA-C  ondansetron (ZOFRAN) 4 MG tablet Take 1 tablet (4 mg total) by mouth every 6 (six) hours. 12/19/14   Hyman Bible, PA-C  oxyCODONE-acetaminophen (PERCOCET/ROXICET) 5-325 MG per tablet Take 1-2 tablets by mouth every 6 (six) hours as needed for severe pain. 12/19/14   Heather Laisure, PA-C  polyethylene glycol powder (GLYCOLAX/MIRALAX) powder Take 17 g by mouth daily. Patient taking differently: Take 17 g by mouth daily as needed for mild constipation (constipation).  11/04/14   Gregor Hams, MD   BP 117/70 mmHg  Pulse 77  Temp(Src) 98.1 F (36.7 C) (Oral)   Resp 17  Ht 5' (1.524 m)  Wt 196 lb 9.6 oz (89.177 kg)  BMI 38.40 kg/m2  SpO2 99%  LMP 01/22/2015 Physical Exam  Constitutional: She is oriented to person, place, and time. She appears well-developed and well-nourished. No distress.  HENT:  Head: Normocephalic and atraumatic.  Eyes: Conjunctivae and EOM are normal.  Cardiovascular: Normal rate and regular rhythm.   Pulmonary/Chest: Effort normal and breath sounds normal. No stridor. No respiratory distress.  Abdominal: She exhibits no distension.  Musculoskeletal: She exhibits no edema.  Neurological: She is alert and oriented to person, place, and time. No cranial nerve deficit.  Skin: Skin is warm and dry.  Psychiatric: She has a normal mood and affect.  Nursing note and vitals reviewed.   ED Course  Procedures (including critical care time) Labs Review Labs Reviewed  CBC WITH DIFFERENTIAL/PLATELET - Abnormal; Notable for the following:    Hemoglobin 11.8 (*)    All other components within normal limits  I-STAT CHEM 8, ED - Abnormal; Notable for the following:    Glucose, Bld 103 (*)    All other components within normal limits  I-STAT TROPOININ, ED    Imaging Review Dg Chest 2 View  02/05/2015   CLINICAL DATA:  Acute onset of left upper chest pain and difficulty taking deep breaths. Initial encounter.  EXAM: CHEST  2 VIEW  COMPARISON:  Chest radiograph performed 08/23/2014, and CTA of the chest performed 10/24/2014  FINDINGS: The lungs are well-aerated and clear. There is no evidence of focal opacification, pleural effusion or pneumothorax.  The heart is borderline normal in size. No acute osseous abnormalities are seen. Clips are noted within the right upper quadrant, reflecting prior cholecystectomy.  IMPRESSION: No acute cardiopulmonary process seen.   Electronically Signed   By: Garald Balding M.D.   On: 02/05/2015 22:54     EKG Interpretation   Date/Time:  Saturday Feb 05 2015 21:24:41 EDT Ventricular Rate:  83 PR  Interval:  148 QRS Duration: 88 QT Interval:  374 QTC Calculation: 439 R Axis:   64 Text Interpretation:  Normal sinus rhythm Normal ECG Sinus rhythm Normal  ECG Confirmed by Carmin Muskrat  MD ([id number]) on 02/05/2015 10:35:28 PM     Pulse oximetry 99% room air normal Cardiac 75 sinus normal  On repeat exam after receiving medication, albuterol inhaler, the patient remains in generally good condition.  MDM   Healthy appearing young female presents with one week of intermittent left-sided chest pain. EKG, labs, absence of risk factors all reassuring for the low suspicion of ongoing coronary ischemia. Patient has no risk factors for PE. Patient does have recent diagnosis of asthma, and this presentation seems most likely inflammatory in nature.  With no evidence for distress, the patient the patient was started on a course of bronchodilator, steroids, discharged in stable condition to follow-up with primary care.     Carmin Muskrat, MD 02/05/15 [phone]

## 2015-02-05 NOTE — ED Notes (Signed)
MD at bedside. 

## 2015-02-05 NOTE — Discharge Instructions (Signed)
Su dolor es de Musician / los pulmones.  Toma la medicina por los proximos dias.  Necesita [place] el Albuterol cuatro veces al dia [employer].  Duespues, puede usar el Albuterol [employer].  Es muy importante que have una cita con su doctor para saber ques Ud. esta mejorando.  Regresa aqui si hay algo diferente o que parece peligroso.   Dolor de pecho (no especfico) (Chest Pain (Nonspecific)) Con frecuencia es difcil dar un diagnstico especfico de la causa del dolor de [place]. Siempre hay una posibilidad de que el dolor podra estar relacionado con algo grave, como un ataque al corazn o un cogulo sanguneo en los pulmones. Debe someterse a controles con el mdico para ms evaluaciones. CAUSAS   Acidez.  Neumona o bronquitis.  Ansiedad o estrs.  Inflamacin de la zona que rodea al corazn (pericarditis) o a los pulmones (pleuritis o pleuresa).  Un cogulo sanguneo en el pulmn.  Colapso de un pulmn (neumotrax), que puede aparecer de [employer] repentina por s solo (neumotrax espontneo) o debido a un traumatismo en el trax.  Culebrilla (virus del herpes zster). La pared torcica est compuesta por huesos, msculos y Database administrator. Cualquiera de estos puede ser la fuente del dolor.  Puede haber una contusin en los huesos debido a una lesin.  Puede haber un esguince en los msculos o el cartlago ocasionado por la tos o por [place].  El cartlago puede verse afectado por una inflamacin y Engineer, production (costocondritis). DIAGNSTICO  Ileene Hutchinson se necesiten anlisis de laboratorio u otros estudios para Animator causa del Social research officer, government. Adems, puede indicarle que se haga una prueba llamada electrocadiograma (ECG) ambulatorio. El ECG registra los patrones de los latidos cardacos durante 24horas. Adems, pueden hacerle otros estudios, por ejemplo:  Ecocardiograma transtorcico (ETT). Durante IT trainer, se usan ondas sonoras para evaluar el flujo  de la sangre a travs del corazn.  Ecocardiograma transesofgico (ETE).  Monitoreo cardaco. Permite que el mdico controle la frecuencia y el ritmo cardaco en tiempo real.  Monitor Holter. Es un dispositivo porttil que [employer] latidos cardacos y [place] a Retail buyer las arritmias cardacas. Le permite al [employer] registrar la actividad [place], si es necesario.  Pruebas de estrs por ejercicio o por medicamentos que aceleran los latidos cardacos. TRATAMIENTO   El tratamiento depende de la causa del dolor de [place]. El tratamiento puede incluir:  Inhibidores de la acidez estomacal.  Antiinflamatorios.  Analgsicos para las enfermedades inflamatorias.  Antibiticos, si hay una infeccin.  Podrn aconsejarle que modifique su estilo de vida. Esto incluye dejar de fumar y evitar el alcohol, la cafena y el chocolate.  Pueden aconsejarle que mantenga la cabeza levantada (elevada) cuando duerme. Esto reduce la probabilidad de que el cido retroceda del estmago al esfago. En la [employer], el dolor de pecho no especfico mejorar en el trmino de 2 a 3das, con reposo y [employer].  INSTRUCCIONES PARA EL CUIDADO EN EL HOGAR   Si le prescriben antibiticos, tmelos tal como se le indic. Termnelos aunque comience a sentirse mejor.  [address] no haga actividades fsicas que provoquen dolor de [place]. Contine con las actividades fsicas tal como se le indic  No consuma ningn producto que contenga tabaco, incluidos cigarrillos, tabaco de Higher education careers adviser o cigarrillos electrnicos.  Evite el consumo de alcohol.  Tome los medicamentos solamente como se lo haya indicado el mdico.  Siga las sugerencias del mdico en lo que respecta a las  pruebas adicionales, si el dolor de pecho no desaparece.  Concurra a todas las visitas de control programadas. Si no lo hace, podra desarrollar problemas permanentes (crnicos) relacionados con  el dolor. Si hay algn problema para concurrir a una cita, llame para reprogramarla. SOLICITE ATENCIN MDICA SI:   El dolor de pecho no desaparece, incluso despus del tratamiento.  Tiene una erupcin cutnea con ampollas en el pecho.  Tiene fiebre. SOLICITE ATENCIN MDICA DE [employer] SI:   Aumenta el dolor de pecho o este se irradia hacia el brazo, el cuello, la [place], la espalda o el abdomen.  Le falta el aire.  La tos empeora, o expectora sangre.  Siente dolor intenso en la espalda o el abdomen.  Se siente nauseoso o vomita.  Siente debilidad intensa.  Se desmaya.  Tiene escalofros. Esto es Engineer, maintenance (IT). No espere a ver si el dolor se pasa. Obtenga ayuda mdica de inmediato. Llame a los servicios de emergencia locales (911 en [place]). No conduzca por sus propios medios [employer] hospital. ASEGRESE DE QUE:   Comprende estas instrucciones.  Controlar su afeccin.  Recibir ayuda de inmediato si no mejora o si empeora. Document Released: 09/03/2005 Document Revised: 09/08/2013 [facility] Patient Information 2015 [place]. This information is not intended to replace advice given to you by your health care provider. Make sure you discuss any questions you have with your health care provider.

## 2015-02-05 NOTE — ED Notes (Addendum)
Pt st's she has had left upper chest pain off and on x's 1 week.  St's feels like she can't take a deep breath. Pt denies having cough or cold symptoms.  Interpreter phone used for triage

## 2015-02-17 ENCOUNTER — Encounter (HOSPITAL_COMMUNITY): Payer: Self-pay | Admitting: Emergency Medicine

## 2015-02-17 ENCOUNTER — Emergency Department (HOSPITAL_COMMUNITY)
Admission: EM | Admit: 2015-02-17 | Discharge: 2015-02-17 | Disposition: A | Discharge status: Home / Self Care | Payer: Self-pay | Attending: Emergency Medicine | Admitting: Emergency Medicine

## 2015-02-17 DIAGNOSIS — Z87442 Personal history of urinary calculi: Secondary | ICD-10-CM | POA: Insufficient documentation

## 2015-02-17 DIAGNOSIS — R1110 Vomiting, unspecified: Secondary | ICD-10-CM | POA: Insufficient documentation

## 2015-02-17 DIAGNOSIS — R51 Headache: Secondary | ICD-10-CM | POA: Insufficient documentation

## 2015-02-17 DIAGNOSIS — Z792 Long term (current) use of antibiotics: Secondary | ICD-10-CM | POA: Insufficient documentation

## 2015-02-17 DIAGNOSIS — J45909 Unspecified asthma, uncomplicated: Secondary | ICD-10-CM | POA: Insufficient documentation

## 2015-02-17 DIAGNOSIS — Z3202 Encounter for pregnancy test, result negative: Secondary | ICD-10-CM | POA: Insufficient documentation

## 2015-02-17 DIAGNOSIS — Z8719 Personal history of other diseases of the digestive system: Secondary | ICD-10-CM | POA: Insufficient documentation

## 2015-02-17 DIAGNOSIS — Z79899 Other long term (current) drug therapy: Secondary | ICD-10-CM | POA: Insufficient documentation

## 2015-02-17 DIAGNOSIS — R519 Headache, unspecified: Secondary | ICD-10-CM

## 2015-02-17 LAB — URINALYSIS, ROUTINE W REFLEX MICROSCOPIC
Bilirubin Urine: NEGATIVE
Glucose, UA: NEGATIVE mg/dL
Hgb urine dipstick: NEGATIVE
Ketones, ur: NEGATIVE mg/dL
Leukocytes, UA: NEGATIVE
Nitrite: POSITIVE — AB
PH: 6.5 (ref 5.0–8.0)
Protein, ur: NEGATIVE mg/dL
Specific Gravity, Urine: 1.015 (ref 1.005–1.030)
UROBILINOGEN UA: 0.2 mg/dL (ref 0.0–1.0)

## 2015-02-17 LAB — COMPREHENSIVE METABOLIC PANEL
ALT: 50 U/L (ref 14–54)
ANION GAP: 9 (ref 5–15)
AST: 46 U/L — AB (ref 15–41)
Albumin: 3.9 g/dL (ref 3.5–5.0)
Alkaline Phosphatase: 89 U/L (ref 38–126)
BILIRUBIN TOTAL: 0.4 mg/dL (ref 0.3–1.2)
BUN: 11 mg/dL (ref 6–20)
CALCIUM: 9.1 mg/dL (ref 8.9–10.3)
CO2: 24 mmol/L (ref 22–32)
Chloride: 104 mmol/L (ref 101–111)
Creatinine, Ser: 0.78 mg/dL (ref 0.44–1.00)
GFR calc non Af Amer: >60 mL/min (ref >60)
Glucose, Bld: 102 mg/dL — ABNORMAL HIGH (ref 65–99)
Potassium: 3.4 mmol/L — ABNORMAL LOW (ref 3.5–5.1)
Sodium: 137 mmol/L (ref 135–145)
TOTAL PROTEIN: 7.1 g/dL (ref 6.5–8.1)

## 2015-02-17 LAB — CBC WITH DIFFERENTIAL/PLATELET
Basophils Absolute: 0 10*3/uL (ref 0.0–0.1)
Basophils Relative: 0 % (ref 0–1)
Eosinophils Absolute: 0.1 10*3/uL (ref 0.0–0.7)
Eosinophils Relative: 1 % (ref 0–5)
HEMATOCRIT: 36.4 % (ref 36.0–46.0)
Hemoglobin: 12.2 g/dL (ref 12.0–15.0)
LYMPHS ABS: 2.8 10*3/uL (ref 0.7–4.0)
Lymphocytes Relative: 27 % (ref 12–46)
MCH: 28.4 pg (ref 26.0–34.0)
MCHC: 33.5 g/dL (ref 30.0–36.0)
MCV: 84.8 fL (ref 78.0–100.0)
Monocytes Absolute: 0.4 10*3/uL (ref 0.1–1.0)
Monocytes Relative: 4 % (ref 3–12)
NEUTROS ABS: 6.9 10*3/uL (ref 1.7–7.7)
Neutrophils Relative %: 68 % (ref 43–77)
Platelets: 287 10*3/uL (ref 150–400)
RBC: 4.29 MIL/uL (ref 3.87–5.11)
RDW: 13.6 % (ref 11.5–15.5)
WBC: 10.3 10*3/uL (ref 4.0–10.5)

## 2015-02-17 LAB — URINE MICROSCOPIC-ADD ON

## 2015-02-17 LAB — POC URINE PREG, ED: Preg Test, Ur: NEGATIVE

## 2015-02-17 MED ORDER — DIPHENHYDRAMINE HCL 50 MG/ML IJ SOLN
25.0000 mg | Freq: Once | INTRAMUSCULAR | Status: AC
  Administered 2015-02-17: 25 mg via INTRAVENOUS
  Filled 2015-02-17: qty 1

## 2015-02-17 MED ORDER — IBUPROFEN 600 MG PO TABS: 600.0000 mg | ORAL_TABLET | Freq: Four times a day (QID) | ORAL | Status: DC | PRN

## 2015-02-17 MED ORDER — DIPHENHYDRAMINE HCL 25 MG PO TABS: 25.0000 mg | ORAL_TABLET | Freq: Four times a day (QID) | ORAL | Status: DC

## 2015-02-17 MED ORDER — METOCLOPRAMIDE HCL 10 MG PO TABS: 10.0000 mg | ORAL_TABLET | Freq: Four times a day (QID) | ORAL | Status: DC

## 2015-02-17 MED ORDER — KETOROLAC TROMETHAMINE 30 MG/ML IJ SOLN
30.0000 mg | Freq: Once | INTRAMUSCULAR | Status: AC
  Administered 2015-02-17: 30 mg via INTRAVENOUS
  Filled 2015-02-17: qty 1

## 2015-02-17 MED ORDER — SODIUM CHLORIDE 0.9 % IV SOLN
1000.0000 mL | Freq: Once | INTRAVENOUS | Status: AC
  Administered 2015-02-17: 1000 mL via INTRAVENOUS

## 2015-02-17 MED ORDER — METOCLOPRAMIDE HCL 5 MG/ML IJ SOLN
10.0000 mg | Freq: Once | INTRAMUSCULAR | Status: AC
  Administered 2015-02-17: 10 mg via INTRAVENOUS
  Filled 2015-02-17: qty 2

## 2015-02-17 NOTE — ED Notes (Signed)
Per interpretor pt reports a severe HA that started today with associated with dizziness, lightheadedness, & light sensitivity. Pt also reports she has vomiting 3-4 X today and has right sided flank pain, denies difficulty urinating or painful urination.

## 2015-02-17 NOTE — ED Provider Notes (Addendum)
CSN: [id number]     Arrival date & time 02/17/15  1823 History   First MD Initiated Contact with Patient 02/17/15 2047     Chief Complaint  Patient presents with  . Headache  . Emesis     (Consider location/radiation/quality/duration/timing/severity/associated sxs/prior Treatment) HPI Patient states she has a generalized headache. She states it started yesterday. She reports she has vomited about 5 times today. She denies any difficulty with her vision. She denies nasal drainage or discharge. She denies sore throat. She denies fever. She reports she's tried Tylenol for pain.  She denies any abdominal pain.  Past Medical History  Diagnosis Date  . Diverticulitis   . Pyelonephritis   . Pyelonephritis   . Asthma    Past Surgical History  Procedure Laterality Date  . Cholecystectomy    . Cesarean section    . Cesarean section N/A 2006  . Cesarean section N/A 09/03/2014    Procedure: CESAREAN SECTION;  Surgeon: Guss Bunde, MD;  Location: [facility];  Service: Obstetrics;  Laterality: N/A;  . Tubal ligation     Family History  Problem Relation Age of Onset  . Hyperlipidemia Mother   . Diabetes Maternal Uncle   . Diabetes Paternal Grandmother    History  Substance Use Topics  . Smoking status: Never Smoker   . Smokeless tobacco: Never Used  . Alcohol Use: No   OB History    Gravida Para Term Preterm AB TAB SAB Ectopic Multiple Living   5 3 1 2 2  2  1 2      Review of Systems 10 Systems reviewed and are negative for acute change except as noted in the HPI.    Allergies  Review of patient's allergies indicates no known allergies.  Home Medications   Prior to Admission medications   Medication Sig Start Date End Date Taking? Authorizing Provider  acetaminophen (TYLENOL) 500 MG tablet Take 1 tablet (500 mg total) by mouth every 6 (six) hours as needed (pain). 11/01/14   Simbiso Ranga, MD  albuterol (PROVENTIL HFA;VENTOLIN HFA) 108 (90 BASE) MCG/ACT inhaler Inhale 4  puffs into the lungs every 6 (six) hours as needed for wheezing or shortness of breath. 08/13/14   Mariel Aloe, MD  cephALEXin (KEFLEX) 500 MG capsule Take 1 capsule (500 mg total) by mouth 3 (three) times daily. Patient not taking: Reported on 12/19/2014 12/03/14   Larene Pickett, PA-C  diphenhydrAMINE (BENADRYL) 25 MG tablet Take 1 tablet (25 mg total) by mouth every 6 (six) hours. 02/17/15   Charlesetta Shanks, MD  ibuprofen (ADVIL,MOTRIN) 600 MG tablet Take 1 tablet (600 mg total) by mouth every 6 (six) hours. Patient taking differently: Take 600 mg by mouth every 6 (six) hours as needed for moderate pain (pain).  09/06/14   Lisa A Leftwich-Kirby, CNM  ibuprofen (ADVIL,MOTRIN) 600 MG tablet Take 1 tablet (600 mg total) by mouth every 6 (six) hours as needed. 02/17/15   Charlesetta Shanks, MD  levofloxacin (LEVAQUIN) 750 MG tablet Take 1 tablet (750 mg total) by mouth daily. X 7 days Patient not taking: Reported on 12/19/2014 11/18/14   Jarrett Soho Muthersbaugh, PA-C  metoCLOPramide (REGLAN) 10 MG tablet Take 1 tablet (10 mg total) by mouth every 6 (six) hours. 02/17/15   Charlesetta Shanks, MD  ondansetron (ZOFRAN) 4 MG tablet Take 1 tablet (4 mg total) by mouth every 6 (six) hours. 12/19/14   Hyman Bible, PA-C  oxyCODONE-acetaminophen (PERCOCET/ROXICET) 5-325 MG per tablet Take 1-2 tablets by mouth  every 6 (six) hours as needed for severe pain. 12/19/14   Heather Laisure, PA-C  polyethylene glycol powder (GLYCOLAX/MIRALAX) powder Take 17 g by mouth daily. Patient taking differently: Take 17 g by mouth daily as needed for mild constipation (constipation).  11/04/14   Gregor Hams, MD   BP 109/67 mmHg  Pulse 73  Temp(Src) 98.3 F (36.8 C) (Oral)  Resp 22  Ht 5' (1.524 m)  Wt 195 lb (88.451 kg)  BMI 38.08 kg/m2  SpO2 99%  LMP 02/03/2015 (Exact Date)  Breastfeeding? Yes Physical Exam  Constitutional: She is oriented to person, place, and time. She appears well-developed and well-nourished.  HENT:  Head:  Normocephalic and atraumatic.  Eyes: EOM are normal. Pupils are equal, round, and reactive to light.  Neck: Neck supple.  Cardiovascular: Normal rate, regular rhythm, normal heart sounds and intact distal pulses.   Pulmonary/Chest: Effort normal and breath sounds normal.  Abdominal: Soft. Bowel sounds are normal. She exhibits no distension. There is no tenderness.  Musculoskeletal: Normal range of motion. She exhibits no edema.  Neurological: She is alert and oriented to person, place, and time. She has normal strength. Coordination normal. GCS eye subscore is 4. GCS verbal subscore is 5. GCS motor subscore is 6.  Skin: Skin is warm, dry and intact.  Psychiatric: She has a normal mood and affect.    ED Course  Procedures (including critical care time) Labs Review Labs Reviewed  URINALYSIS, ROUTINE W REFLEX MICROSCOPIC (NOT AT [facility]) - Abnormal; Notable for the following:    APPearance CLOUDY (*)    Nitrite POSITIVE (*)    All other components within normal limits  COMPREHENSIVE METABOLIC PANEL - Abnormal; Notable for the following:    Potassium 3.4 (*)    Glucose, Bld 102 (*)    AST 46 (*)    All other components within normal limits  URINE MICROSCOPIC-ADD ON - Abnormal; Notable for the following:    Squamous Epithelial / LPF FEW (*)    Bacteria, UA FEW (*)    All other components within normal limits  URINE CULTURE  CBC WITH DIFFERENTIAL/PLATELET  POC URINE PREG, ED    Imaging Review No results found.   EKG Interpretation None     Patient improved after treatment. Recheck 23:00. Mental status is clear. Patient is nontoxic. Neurologic examination normal. The patient now feels ready to eat. MDM   Final diagnoses:  Acute nonintractable headache, unspecified headache type   Patient presents a generalized headache with no neurologic symptoms or signs of acute infectious etiology. She has improved with symptomatic treatment and at this time I feel safe for discharge and  continued symptomatic treatment with follow-up. The patient's counseled on the need for follow-up and returning if worsening or changing symptoms.    Charlesetta Shanks, MD 02/17/15 [id number]  Charlesetta Shanks, MD 03/01/15 [phone]

## 2015-02-17 NOTE — Discharge Instructions (Signed)
Dolor de cabeza general sin causa  (General Headache Without Cause)   EL dolor de cabeza es un dolor o malestar que se siente en la zona de la cabeza o del cuello. Puede no tener una causa especfica. Hay muchas causas y tipos de dolores de [place]. Los ms comunes son:   Cefalea tensional.  Cefaleas migraosas.  Cefalea en brotes.  Cefaleas diarias crnicas. INSTRUCCIONES PARA EL CUIDADO EN EL HOGAR   [place] con todas las citas programadas con su mdico o con el especialista al que lo hayan derivado.  Slo tome medicamentos de venta libre o recetados para Glass blower/designer o Health and safety inspector, segn las indicaciones de su mdico.  Cuando sienta dolor de cabeza acustese en un cuarto oscuro y tranquilo.  Lleve un registro diario para Neurosurgeon lo que Engineer, mining. Por ejemplo, escriba:  Lo que come y bebe.  Cunto tiempo duerme.  Todo cambio en la dieta o medicamentos.  Intente con masajes u otras tcnicas de relajacin.  Colquese compresas de hielo o calor en la cabeza y en el cuello. selos 3 a 4 veces por da de 15 a 20 minutos por vez, o como sea necesario.  Limite las situaciones de estrs.  Sintese con la espalda recta y no  tense los msculos.  Si fuma, deje de hacerlo.  Limite el consumo de bebidas alcohlicas.  Consuma menos cantidad de cafena o deje de tomarla.  Coma y duerma en horarios regulares.  Duerma entre 7 y 31 horas o como lo indique su [place] luces tenues si le molestan las luces brillantes y Actor dolor de [place]. SOLICITE ATENCIN MDICA SI:   Tiene problemas con los Haematologist.  Los medicamentos no Teaching laboratory technician.  El dolor de cabeza que senta habitualmente es diferente.  Tiene nuseas o vmitos. SOLICITE ATENCIN MDICA DE INMEDIATO SI:   El dolor se hace cada vez ms intenso.  Tiene fiebre.  Presenta rigidez en el cuello.  Sufre prdida de la visin.  Presenta debilidad muscular o prdida  del control muscular.  Comienza a perder el equilibrio o tiene problemas para caminar.  Sufre mareos o se desmaya.  Tiene sntomas graves que son diferentes a los primeros sntomas. ASEGRESE DE QUE:   Comprende estas instrucciones.  Controlar su enfermedad.  Solicitar ayuda de inmediato si no mejora o empeora. Document Released: 06/13/2005 Document Revised: 03/04/2012 [facility] Patient Information 2015 [place]. This information is not intended to replace advice given to you by your health care provider. Make sure you discuss any questions you have with your health care provider.

## 2015-02-19 LAB — URINE CULTURE: Colony Count: 50000

## 2015-02-24 ENCOUNTER — Encounter (HOSPITAL_COMMUNITY): Payer: Self-pay | Admitting: Obstetrics & Gynecology

## 2015-03-10 ENCOUNTER — Encounter (HOSPITAL_COMMUNITY): Payer: Self-pay | Admitting: Emergency Medicine

## 2015-03-10 ENCOUNTER — Emergency Department (HOSPITAL_COMMUNITY)
Admission: EM | Admit: 2015-03-10 | Discharge: 2015-03-11 | Disposition: A | Discharge status: Home / Self Care | Payer: Medicaid Other | Attending: Emergency Medicine | Admitting: Emergency Medicine

## 2015-03-10 DIAGNOSIS — J45909 Unspecified asthma, uncomplicated: Secondary | ICD-10-CM | POA: Insufficient documentation

## 2015-03-10 DIAGNOSIS — Z79899 Other long term (current) drug therapy: Secondary | ICD-10-CM | POA: Insufficient documentation

## 2015-03-10 DIAGNOSIS — N12 Tubulo-interstitial nephritis, not specified as acute or chronic: Secondary | ICD-10-CM | POA: Insufficient documentation

## 2015-03-10 DIAGNOSIS — N898 Other specified noninflammatory disorders of vagina: Secondary | ICD-10-CM | POA: Insufficient documentation

## 2015-03-10 DIAGNOSIS — Z3202 Encounter for pregnancy test, result negative: Secondary | ICD-10-CM | POA: Insufficient documentation

## 2015-03-10 DIAGNOSIS — Z8719 Personal history of other diseases of the digestive system: Secondary | ICD-10-CM | POA: Insufficient documentation

## 2015-03-10 LAB — COMPREHENSIVE METABOLIC PANEL
ALT: 53 U/L (ref 14–54)
AST: 36 U/L (ref 15–41)
Albumin: 3.9 g/dL (ref 3.5–5.0)
Alkaline Phosphatase: 107 U/L (ref 38–126)
Anion gap: 8 (ref 5–15)
BILIRUBIN TOTAL: 0.4 mg/dL (ref 0.3–1.2)
BUN: 11 mg/dL (ref 6–20)
CHLORIDE: 103 mmol/L (ref 101–111)
CO2: 27 mmol/L (ref 22–32)
Calcium: 9.3 mg/dL (ref 8.9–10.3)
Creatinine, Ser: 0.81 mg/dL (ref 0.44–1.00)
GFR calc Af Amer: >60 mL/min (ref >60)
GFR calc non Af Amer: >60 mL/min (ref >60)
Glucose, Bld: 99 mg/dL (ref 65–99)
Potassium: 3.2 mmol/L — ABNORMAL LOW (ref 3.5–5.1)
SODIUM: 138 mmol/L (ref 135–145)
Total Protein: 7.3 g/dL (ref 6.5–8.1)

## 2015-03-10 LAB — CBC WITH DIFFERENTIAL/PLATELET
Basophils Absolute: 0 10*3/uL (ref 0.0–0.1)
Basophils Relative: 1 % (ref 0–1)
Eosinophils Absolute: 0.2 10*3/uL (ref 0.0–0.7)
Eosinophils Relative: 2 % (ref 0–5)
HCT: 38.1 % (ref 36.0–46.0)
HEMOGLOBIN: 12.6 g/dL (ref 12.0–15.0)
LYMPHS PCT: 22 % (ref 12–46)
Lymphs Abs: 1.8 10*3/uL (ref 0.7–4.0)
MCH: 28.5 pg (ref 26.0–34.0)
MCHC: 33.1 g/dL (ref 30.0–36.0)
MCV: 86.2 fL (ref 78.0–100.0)
MONOS PCT: 4 % (ref 3–12)
Monocytes Absolute: 0.3 10*3/uL (ref 0.1–1.0)
NEUTROS ABS: 6 10*3/uL (ref 1.7–7.7)
Neutrophils Relative %: 71 % (ref 43–77)
Platelets: 293 10*3/uL (ref 150–400)
RBC: 4.42 MIL/uL (ref 3.87–5.11)
RDW: 13.2 % (ref 11.5–15.5)
WBC: 8.4 10*3/uL (ref 4.0–10.5)

## 2015-03-10 LAB — URINE MICROSCOPIC-ADD ON

## 2015-03-10 LAB — URINALYSIS, ROUTINE W REFLEX MICROSCOPIC
Bilirubin Urine: NEGATIVE
Glucose, UA: NEGATIVE mg/dL
HGB URINE DIPSTICK: NEGATIVE
Ketones, ur: 15 mg/dL — AB
Nitrite: POSITIVE — AB
Protein, ur: NEGATIVE mg/dL
Specific Gravity, Urine: 1.031 — ABNORMAL HIGH (ref 1.005–1.030)
Urobilinogen, UA: 0.2 mg/dL (ref 0.0–1.0)
pH: 6.5 (ref 5.0–8.0)

## 2015-03-10 LAB — WET PREP, GENITAL
Clue Cells Wet Prep HPF POC: NONE SEEN
Trich, Wet Prep: NONE SEEN
YEAST WET PREP: NONE SEEN

## 2015-03-10 LAB — POC URINE PREG, ED: Preg Test, Ur: NEGATIVE

## 2015-03-10 MED ORDER — ONDANSETRON HCL 4 MG PO TABS: 4.0000 mg | ORAL_TABLET | Freq: Three times a day (TID) | ORAL | Status: DC | PRN

## 2015-03-10 MED ORDER — OXYCODONE-ACETAMINOPHEN 5-325 MG PO TABS: 1.0000 | ORAL_TABLET | ORAL | Status: DC | PRN

## 2015-03-10 MED ORDER — ONDANSETRON HCL 4 MG/2ML IJ SOLN
4.0000 mg | Freq: Once | INTRAMUSCULAR | Status: AC
  Administered 2015-03-10: 4 mg via INTRAVENOUS
  Filled 2015-03-10: qty 2

## 2015-03-10 MED ORDER — CEPHALEXIN 500 MG PO CAPS: 500.0000 mg | ORAL_CAPSULE | Freq: Four times a day (QID) | ORAL | Status: DC

## 2015-03-10 MED ORDER — SODIUM CHLORIDE 0.9 % IV BOLUS (SEPSIS)
1000.0000 mL | Freq: Once | INTRAVENOUS | Status: AC
  Administered 2015-03-10: 1000 mL via INTRAVENOUS

## 2015-03-10 MED ORDER — DEXTROSE 5 % IV SOLN
1.0000 g | Freq: Once | INTRAVENOUS | Status: AC
  Administered 2015-03-10: 1 g via INTRAVENOUS
  Filled 2015-03-10: qty 10

## 2015-03-10 MED ORDER — HYDROMORPHONE HCL 1 MG/ML IJ SOLN
1.0000 mg | Freq: Once | INTRAMUSCULAR | Status: AC
  Administered 2015-03-10: 1 mg via INTRAVENOUS
  Filled 2015-03-10: qty 1

## 2015-03-10 MED ORDER — MORPHINE SULFATE 4 MG/ML IJ SOLN
4.0000 mg | Freq: Once | INTRAMUSCULAR | Status: AC
  Administered 2015-03-10: 4 mg via INTRAVENOUS
  Filled 2015-03-10: qty 1

## 2015-03-10 MED ORDER — METOCLOPRAMIDE HCL 5 MG/ML IJ SOLN
10.0000 mg | Freq: Once | INTRAMUSCULAR | Status: AC
  Administered 2015-03-10: 10 mg via INTRAVENOUS
  Filled 2015-03-10: qty 2

## 2015-03-10 MED ORDER — POTASSIUM CHLORIDE CRYS ER 20 MEQ PO TBCR
40.0000 meq | EXTENDED_RELEASE_TABLET | Freq: Once | ORAL | Status: AC
  Administered 2015-03-11: 40 meq via ORAL
  Filled 2015-03-10: qty 2

## 2015-03-10 NOTE — Discharge Instructions (Signed)
Read the information below.  Use the prescribed medication as directed.  Please discuss all new medications with your pharmacist.  Do not take additional tylenol while taking the prescribed pain medication to avoid overdose.  You may return to the Emergency Department at any time for worsening condition or any new symptoms that concern you.    If you develop high fevers, worsening abdominal pain, uncontrolled vomiting, or are unable to tolerate fluids by mouth, return to the ER for a recheck.    Lea la siguiente informacin . Usar el medicamento recetado como se indica. Por favor discutir todos los medicamentos nuevos con su farmacutico . No tome Tylenol adicional mientras est tomando el medicamento para el dolor prescrita para evitar sobredosis. Usted puede volver a la sala de urgencias en cualquier momento por cualquier condicin o nuevos sntomas que le preocupan empeoramiento . Si desarrolla fiebre alta, empeoramiento del dolor abdominal, vmitos incontrolados , o es incapaz de Tree surgeon lquidos por va oral , volver a la sala de emergencias de volver a examinar .    Pielonefritis - Adultos  (Pyelonephritis, Adult)  La pielonefritis es una infeccin del rin. Puede curarse rpidamente o durar [employer].  CUIDADOS EN EL HOGAR   Tome los medicamentos (antibiticos) tal como se le indic. Finalice la prescripcin completa, aunque se sienta mejor.  Cumpla con los controles mdicos segn las indicaciones.  Beba gran cantidad de lquido para mantener la orina de tono claro o color amarillo plido.  Slo tome la medicacin segn las indicaciones. SOLICITE AYUDA DE INMEDIATO SI:   Tiene fiebre o sntomas que persisten durante ms de 2-3 das.  Tiene fiebre y los sntomas empeoran.  No puede tomar los medicamentos o beber lquidos segn las indicaciones.  Siente escalofros o tiene fiebre.  Se siente dbil o se desvanece (se desmaya).  No mejora luego de 2 das. ASEGRESE DE QUE:    Comprende estas instrucciones.  Controlar su enfermedad.  Solicitar ayuda de inmediato si no mejora o empeora. Document Released: 09/03/2005 Document Revised: 03/04/2012 [facility] Patient Information 2015 [place], [place]. This information is not intended to replace advice given to you by your health care provider. Make sure you discuss any questions you have with your health care provider.    Emergency Department Resource Guide 1) Find a Doctor and Pay Out of Pocket Although you won't have to find out who is covered by your insurance plan, it is a good idea to ask around and get recommendations. You will then need to call the office and see if the doctor you have chosen will accept you as a new patient and what types of options they offer for patients who are self-pay. Some doctors offer discounts or will set up payment plans for their patients who do not have insurance, but you will need to ask so you aren't surprised when you get to your appointment.  2) Contact Your Local Health Department Not all health departments have doctors that can see patients for sick visits, but many do, so it is worth a call to see if yours does. If you don't know where your local health department is, you can check in your phone book. The CDC also has a tool to help you locate your state's health department, and many state websites also have listings of all of their local health departments.  3) Find a [facility] [facility] If your illness is not likely to be very severe or complicated, you may want to try a walk in  clinic. These are popping up all over the country in pharmacies, drugstores, and shopping centers. They're usually staffed by nurse practitioners or physician assistants that have been trained to treat common illnesses and complaints. They're usually fairly quick and inexpensive. However, if you have serious medical issues or chronic medical problems, these are probably not your best option.  No Primary  Care Doctor: - Call Health Connect at  [phone] - they can help you locate a primary care doctor that  accepts your insurance, provides certain services, etc. - Physician Referral Service- [phone]  Chronic Pain Problems: Organization         Address  Phone   Notes  [facility]  [phone] Patients need to be referred by their primary care doctor.   Medication Assistance: Organization         Address  Phone   Notes  [facility] Medication [facility] [address] [place], [place] [address] [phone] --Must be a resident of [facility] -- Must have NO insurance coverage whatsoever (no Medicaid/ Medicare, etc.) -- The pt. MUST have a primary care doctor that directs their care regularly and follows them in the community   MedAssist  [phone]   [employer]  [phone]    Agencies that provide inexpensive medical care: Organization         Address  Phone   Notes  [place]  [phone]   [facility]    [phone]   [facility] [place], [place] [address] [phone]   [place] [address] [place] [phone]   Planned Parenthood    [phone]   [facility]    [phone]   [place] and [place] [address], [place] Phone:  [phone], Fax:  [phone] Hours of Operation:  9 am - 6 pm, M-F.  Also accepts Medicaid/Medicare and self-pay.  [facility] for [place], [address], [place] Phone: [phone], Fax: [phone]. Hours of Operation:  8:30 am - 5:30 pm, M-F.  Also accepts Medicaid and self-pay.  [facility] [address], [place] Phone: [phone]   [place], [place], [place] [phone], Ext. [phone] Mondays & Thursdays: 7-9 AM.  First 15 patients are seen on a first  come, first serve basis.    [place] Providers:  Organization         Address  Phone   Notes  [facility] [address], [place] [phone] Also accepts self-pay patients.  [facility] [address], [place]  [phone]   [place], [address], [place] [phone]   [facility] [address] [place] [phone]   Lucianne Lei [address], [place]   [phone] Only accepts [place] Access [place] patients after they have their name applied to their card.   Self-Pay (no insurance) in [facility]:  Organization         Address  Phone   Notes  Sickle Cell Patients, [facility] [place] [phone]   [facility] [place] [phone]   [facility] [place]  [address], [place] [phone]   [facility]/Dr. Osei-Bonsu  [address], [place] or [address], [place] [phone] Phone number for both [place] and [place] locations is the same.  Urgent Medical and [facility] [address] [place] [phone]   [facility] [address] [place] or [address] [phone]   [facility] [address], [place] [phone], phone; [phone], fax Sees patients 1st and 3rd Saturday of every month.  Must not qualify for public or private insurance (i.e. Medicaid, Medicare, [place] Health Choice, Veterans' Benefits)  Household income should be no more than 200% of the poverty level The clinic cannot treat you if you are pregnant or think you are pregnant  Sexually transmitted diseases are not treated at the clinic.    Dental Care: Organization          Address  Phone  Notes  [facility] Department of [facility] [phone] Accepts children up to age 29 who are enrolled in [place] or [place]; pregnant women with a Medicaid card; and children who have applied for Medicaid or [place] Health Choice, but were declined, whose parents can pay a reduced fee at time of service.  [facility] Department of [facility]  [address], [place] [phone] Accepts children up to age 19 who are enrolled in [place] or [place]; pregnant women with a Medicaid card; and children who have applied for Medicaid or  Health Choice, but were declined, whose parents can pay a reduced fee at time of service.  [place] Adult Dental Access PROGRAM  [place] [phone] Patients are seen by appointment only. Walk-ins are not accepted. [place] will see patients 82 years of age and older. Monday - Tuesday (8am-5pm) Most Wednesdays (8:30-5pm) $30 per visit, cash only  [facility] Adult Dental Access PROGRAM  [address], [facility] [phone] Patients are seen by appointment only. Walk-ins are not accepted. [place] will see patients 34 years of age and older. One Wednesday Evening (Monthly: Volunteer Based).  $30 per visit, cash only  [place]  [phone] for adults; Children under age 65, call Graduate Pediatric Dentistry at [phone]. Children aged 68-14, please call [phone] to request a pediatric application.  Dental services are provided in all areas of dental care including fillings, crowns and bridges, complete and partial dentures, implants, gum treatment, root canals, and extractions. Preventive care is also provided. Treatment is provided to both adults and children. Patients are selected via a lottery and there is often a waiting list.   [facility] [address] [place]  [phone] [email or web address]   [facility] [address] [employer], [place] [phone], Ext. [phone] Second and Fourth Thursday of each month, opens at 6:30 AM; Clinic ends at 9 AM.  Patients are seen on a first-come first-served basis, and a limited number are seen during each clinic.   [facility]  [address] Hillard Danker [place], [place] [phone]   Eligibility Requirements You must have lived in [place], [place], or [place] counties for at least the last three months.   You cannot be eligible for state or federal sponsored [employer], including  [employer], [place], or [employer].   You generally cannot be eligible for healthcare insurance through your employer.    How to apply: Eligibility screenings are held every Tuesday and Wednesday afternoon from 1:00 pm until 4:00 pm. You do not need an appointment for the interview!  [facility] [address], [place], [place]   [place]  [place] Department  [place]  [phone]    Behavioral Health Resources in the Community: Intensive Outpatient Programs Organization         Address  Phone  Notes  [place]. [address] [place], [place] [phone]   [facility] [address], [place], [place]   ADS: Alcohol & Drug Svcs [address] [place], [place]   [place] [address],  [place], [place] or [phone]   Substance Abuse Resources Organization         Address  Phone  Notes  Alcohol and Drug Services  [phone]   [place]  [phone]   [facility] [place]   Chinita Pester  [phone]   Residential & Outpatient Substance Abuse Program  [phone]   Psychological  Services Organization         Address  Phone  Notes  [facility]  [place]  [phone]   [place] [address] [place] or [phone]    Mobile Crisis Teams Organization         Address  Phone  Notes  [facility], Mobile Crisis Care Unit  [phone]   Assertive Psychotherapeutic Services  [address] [place], [place]   [place] [address] [place] [phone]    Self-Help/Support Groups Organization         Address  Phone             Notes  [place]. of [place] - variety of support groups  [place] Call for more information  Narcotics Anonymous (NA), Caring Services [address], [employer] [place]  2 meetings at this location   Special educational needs teacher         Address  Phone  Notes  [facility]idential [facility] [place],    [place]  [phone]   [facility]  [address] [place] 629476, [place], [place]   [place], [place] [phone] Admissions: 8am-3pm M-F  Incentives Substance [place] [address]    [place], [place] [phone]   [facility] [address] [place], [place], [place]   [facility] [address],  [place], [place]   Insight Programs - Intensive Outpatient [address] Kristeen [place] [address], [place], [place]   [facility] ([place].,  [place], [place] [phone] or [phone]   [facility]idential Treatment Services (RTS) [address], [place], [place] Accepts Medicaid  [facility] [place] [address]  [place] [phone] Substance Abuse/Addiction Treatment   [facility] Organization         Address  Phone  Notes  [facility]  [phone]   Domenic Schwab, PhD [address] [place], [place]   [phone] or [phone]  [facility]   [address] [place], [place] [phone]   [address], [place], [place] [phone] Insurance/Medicaid/sponsorship through [facility] and Families [address] [place], [place] [phone] [place], [place] [phone]    Dr. Adele Schilder  [phone]   [facility]  [place] Dept. 1) [address], [place] 2) [place] 3)  [place] [address], [place] [phone]  [phone]   [place] [phone] or [phone] (After Hours)

## 2015-03-10 NOTE — ED Provider Notes (Signed)
CSN: [id number]     Arrival date & time 03/10/15  2118 History  This chart was scribed for non-physician practitioner,Jacquiline Zurcher Delma Freeze, working with Davonna Belling, MD, by Helane Gunther ED Scribe. This patient was seen in room TR02C/TR02C and the patient's care was started at 9:49 PM      Chief Complaint  Patient presents with  . Back Pain  . Dysuria   The history is provided by the patient. No language interpreter was used.   HPI Comments: Lorraine [name] is a 29 y.o. female who presents to the Emergency Department complaining of constant, waxing and waning, sharp pain in the right lower back, onset 1 day ago. She describes it as internal, and exacerbated by movement. She reports associated chills (today), nausea, vomiting, diarrhea, dysuria, urinary frequency, and abnormal yellowish-white vaginal discharge. She reports her LNMP 2 weeks ago. Usually she menstruates for 5-6 days, but this month she reports only 3 days. She reports a past surgical history of 2 C-sections, tubal ligation, and a cholecystectomy. She denies sick contacts and recent travel. She is not breast feeding. She denies hematemesis, bloody stool, hematuria, and vaginal bleeding.   Past Medical History  Diagnosis Date  . Diverticulitis   . Pyelonephritis   . Pyelonephritis   . Asthma    Past Surgical History  Procedure Laterality Date  . Cholecystectomy    . Cesarean section    . Cesarean section N/A 2006  . Cesarean section N/A 09/03/2014    Procedure: CESAREAN SECTION;  Surgeon: Guss Bunde, MD;  Location: [facility];  Service: Obstetrics;  Laterality: N/A;  . Tubal ligation     Family History  Problem Relation Age of Onset  . Hyperlipidemia Mother   . Diabetes Maternal Uncle   . Diabetes Paternal Grandmother    History  Substance Use Topics  . Smoking status: Never Smoker   . Smokeless tobacco: Never Used  . Alcohol Use: No   OB History    This patient's OB History needs to be verified. Open OB  History to review and resolve any issues.   Gravida Para Term Preterm AB TAB SAB Ectopic Multiple Living   5 3 1 2 2  2  1 2      Review of Systems  Constitutional: Positive for chills. Negative for fever.  Respiratory: Negative for cough and shortness of breath.   Cardiovascular: Negative for chest pain.  Gastrointestinal: Positive for nausea, vomiting, abdominal pain and diarrhea. Negative for constipation and blood in stool.  Genitourinary: Positive for dysuria, frequency and vaginal discharge. Negative for hematuria and vaginal bleeding.  Musculoskeletal: Negative for myalgias.  Skin: Negative for rash.  Allergic/Immunologic: Negative for immunocompromised state.  Hematological: Does not bruise/bleed easily.  Psychiatric/Behavioral: Negative for self-injury.  All other systems reviewed and are negative.   Allergies  Review of patient's allergies indicates no known allergies.  Home Medications   Prior to Admission medications   Medication Sig Start Date End Date Taking? Authorizing Provider  acetaminophen (TYLENOL) 500 MG tablet Take 1 tablet (500 mg total) by mouth every 6 (six) hours as needed (pain). 11/01/14   Simbiso Ranga, MD  albuterol (PROVENTIL HFA;VENTOLIN HFA) 108 (90 BASE) MCG/ACT inhaler Inhale 4 puffs into the lungs every 6 (six) hours as needed for wheezing or shortness of breath. 08/13/14   Mariel Aloe, MD  cephALEXin (KEFLEX) 500 MG capsule Take 1 capsule (500 mg total) by mouth 3 (three) times daily. Patient not taking: Reported on 12/19/2014 12/03/14  Larene Pickett, PA-C  diphenhydrAMINE (BENADRYL) 25 MG tablet Take 1 tablet (25 mg total) by mouth every 6 (six) hours. 02/17/15   Charlesetta Shanks, MD  ibuprofen (ADVIL,MOTRIN) 600 MG tablet Take 1 tablet (600 mg total) by mouth every 6 (six) hours. Patient taking differently: Take 600 mg by mouth every 6 (six) hours as needed for moderate pain (pain).  09/06/14   Lisa A Leftwich-Kirby, CNM  ibuprofen (ADVIL,MOTRIN)  600 MG tablet Take 1 tablet (600 mg total) by mouth every 6 (six) hours as needed. 02/17/15   Charlesetta Shanks, MD  levofloxacin (LEVAQUIN) 750 MG tablet Take 1 tablet (750 mg total) by mouth daily. X 7 days Patient not taking: Reported on 12/19/2014 11/18/14   Jarrett Soho Muthersbaugh, PA-C  metoCLOPramide (REGLAN) 10 MG tablet Take 1 tablet (10 mg total) by mouth every 6 (six) hours. 02/17/15   Charlesetta Shanks, MD  ondansetron (ZOFRAN) 4 MG tablet Take 1 tablet (4 mg total) by mouth every 6 (six) hours. 12/19/14   Hyman Bible, PA-C  oxyCODONE-acetaminophen (PERCOCET/ROXICET) 5-325 MG per tablet Take 1-2 tablets by mouth every 6 (six) hours as needed for severe pain. 12/19/14   Heather Laisure, PA-C  polyethylene glycol powder (GLYCOLAX/MIRALAX) powder Take 17 g by mouth daily. Patient taking differently: Take 17 g by mouth daily as needed for mild constipation (constipation).  11/04/14   Gregor Hams, MD   BP 121/67 mmHg  Pulse 101  Temp(Src) 98.6 F (37 C) (Oral)  Resp 16  SpO2 99%  LMP 02/13/2015 Physical Exam  Constitutional: She appears well-developed and well-nourished. No distress.  HENT:  Head: Normocephalic and atraumatic.  Neck: Neck supple.  Cardiovascular:  Tachycardic  Pulmonary/Chest: Effort normal.  Abdominal: Soft. She exhibits no distension and no mass. There is tenderness. There is no rebound and no guarding.  Tenderness throughout right flank, RLQ, and right suprapubic area  Musculoskeletal:  Right CVA tenderness  Neurological: She is alert.  Skin: She is not diaphoretic.  Psychiatric: She has a normal mood and affect. Her behavior is normal.  Nursing note and vitals reviewed.   ED Course  Procedures  DIAGNOSTIC STUDIES: Oxygen Saturation is 99% on RA, normal by my interpretation.    COORDINATION OF CARE: 10:00 PM - Discussed plans to order labs antiemetic and pain medication. Pt advised of plan for treatment and pt agrees.  Labs Review Labs Reviewed  WET PREP, GENITAL  - Abnormal; Notable for the following:    WBC, Wet Prep HPF POC FEW (*)    All other components within normal limits  COMPREHENSIVE METABOLIC PANEL - Abnormal; Notable for the following:    Potassium 3.2 (*)    All other components within normal limits  URINALYSIS, ROUTINE W REFLEX MICROSCOPIC (NOT AT [facility]) - Abnormal; Notable for the following:    APPearance CLOUDY (*)    Specific Gravity, Urine 1.031 (*)    Ketones, ur 15 (*)    Nitrite POSITIVE (*)    Leukocytes, UA SMALL (*)    All other components within normal limits  URINE MICROSCOPIC-ADD ON - Abnormal; Notable for the following:    Squamous Epithelial / LPF MANY (*)    Bacteria, UA FEW (*)    All other components within normal limits  URINE CULTURE  CBC WITH DIFFERENTIAL/PLATELET  HIV ANTIBODY (ROUTINE TESTING)  POC URINE PREG, ED  GC/CHLAMYDIA PROBE AMP ([place]) NOT AT [facility]    Imaging Review No results found.   EKG Interpretation None  MDM   Final diagnoses:  Pyelonephritis    Afebrile, nontoxic patient with right flank pain, suprapubic pain, dysuria, urinary frequency, abnormal vaginal discharge, N/V/D that began yesterday.  Labs unremarkable.  UA appears infected, nitrite positive, 11-20 WBC.  Suspect pyleonephritis.  Pt has hx same. Rocephin IV given.   Pelvic exam unremarkable.  Wet prep negative.  Anticipate D/C home with percocet, zofran, keflex.  Urine culture pending.  Pt signed out to [employer], PA-C, at end of shift pending IV abx, PO trial.   Discussed result, findings, treatment, and follow up  with patient.  Pt given return precautions.  Pt verbalizes understanding and agrees with plan.       I personally performed the services described in this documentation, which was scribed in my presence. The recorded information has been reviewed and is accurate.   Clayton Bibles, PA-C 03/10/15 2347  Davonna Belling, MD 03/13/15 [phone]

## 2015-03-10 NOTE — ED Notes (Signed)
Pt. reports right lower back pain with dysuria , low grade fever and chills onset this week . Denies hematuria or injury.

## 2015-03-11 LAB — GC/CHLAMYDIA PROBE AMP (~~LOC~~) NOT AT ARMC
Chlamydia: NEGATIVE
Neisseria Gonorrhea: NEGATIVE

## 2015-03-11 LAB — HIV ANTIBODY (ROUTINE TESTING W REFLEX): HIV Screen 4th Generation wRfx: NONREACTIVE

## 2015-03-11 MED ORDER — OXYCODONE-ACETAMINOPHEN 5-325 MG PO TABS
2.0000 | ORAL_TABLET | Freq: Once | ORAL | Status: AC
  Administered 2015-03-11: 2 via ORAL
  Filled 2015-03-11: qty 2

## 2015-03-11 MED ORDER — ONDANSETRON 4 MG PO TBDP
8.0000 mg | ORAL_TABLET | Freq: Once | ORAL | Status: AC
  Administered 2015-03-11: 8 mg via ORAL
  Filled 2015-03-11: qty 2

## 2015-03-11 NOTE — ED Provider Notes (Signed)
Patient signed out to me at shift change. Tolerating oral intake, but does still feel nauseated.  Will give a dose of oral zofran and percocet prior to discharge.  Antibiotics have finished. Patient will be discharged with pain medicine and Keflex and Zofran for symptomatic relief. Return precautions given. Patient is stable and ready for discharge.    Montine Circle, PA-C 03/11/15 [id number]  Linton Flemings, MD 03/11/15 0230

## 2015-03-13 LAB — URINE CULTURE: Culture: >=100000

## 2015-03-14 ENCOUNTER — Telehealth (HOSPITAL_COMMUNITY): Payer: Self-pay

## 2015-03-14 NOTE — Telephone Encounter (Signed)
Post ED Visit - Positive Culture Follow-up  Culture report reviewed by antimicrobial stewardship pharmacist: []  Wes Dulaney, Pharm.D., BCPS []  Heide Guile, Pharm.D., BCPS []  Alycia Rossetti, Pharm.D., BCPS []  [place], Pharm.D., BCPS, AAHIVP [x]  Legrand Como, Pharm.D., BCPS, AAHIVP []  Isac Sarna, Pharm.D., BCPS  Positive urine culture Treated with cephalexin, organism sensitive to the same and no further patient follow-up is required at this time.  Ileene Musa 03/14/2015, 5:43 PM

## 2015-03-15 ENCOUNTER — Encounter (HOSPITAL_COMMUNITY): Payer: Self-pay | Admitting: Emergency Medicine

## 2015-03-15 ENCOUNTER — Emergency Department (HOSPITAL_COMMUNITY)
Admission: EM | Admit: 2015-03-15 | Discharge: 2015-03-15 | Disposition: A | Discharge status: Home / Self Care | Payer: Medicaid Other | Attending: Emergency Medicine | Admitting: Emergency Medicine

## 2015-03-15 DIAGNOSIS — R109 Unspecified abdominal pain: Secondary | ICD-10-CM

## 2015-03-15 DIAGNOSIS — Z3202 Encounter for pregnancy test, result negative: Secondary | ICD-10-CM | POA: Insufficient documentation

## 2015-03-15 DIAGNOSIS — J45909 Unspecified asthma, uncomplicated: Secondary | ICD-10-CM | POA: Insufficient documentation

## 2015-03-15 DIAGNOSIS — M25511 Pain in right shoulder: Secondary | ICD-10-CM | POA: Insufficient documentation

## 2015-03-15 DIAGNOSIS — N390 Urinary tract infection, site not specified: Secondary | ICD-10-CM | POA: Insufficient documentation

## 2015-03-15 DIAGNOSIS — Z8719 Personal history of other diseases of the digestive system: Secondary | ICD-10-CM | POA: Insufficient documentation

## 2015-03-15 DIAGNOSIS — Z79899 Other long term (current) drug therapy: Secondary | ICD-10-CM | POA: Insufficient documentation

## 2015-03-15 LAB — COMPREHENSIVE METABOLIC PANEL
ALT: 50 U/L (ref 14–54)
AST: 34 U/L (ref 15–41)
Albumin: 3.8 g/dL (ref 3.5–5.0)
Alkaline Phosphatase: 102 U/L (ref 38–126)
Anion gap: 7 (ref 5–15)
BUN: 11 mg/dL (ref 6–20)
CALCIUM: 8.9 mg/dL (ref 8.9–10.3)
CO2: 28 mmol/L (ref 22–32)
Chloride: 103 mmol/L (ref 101–111)
Creatinine, Ser: 0.68 mg/dL (ref 0.44–1.00)
GLUCOSE: 86 mg/dL (ref 65–99)
Potassium: 3.9 mmol/L (ref 3.5–5.1)
Sodium: 138 mmol/L (ref 135–145)
TOTAL PROTEIN: 7 g/dL (ref 6.5–8.1)
Total Bilirubin: 0.1 mg/dL — ABNORMAL LOW (ref 0.3–1.2)

## 2015-03-15 LAB — POC URINE PREG, ED: PREG TEST UR: NEGATIVE

## 2015-03-15 LAB — CBC WITH DIFFERENTIAL/PLATELET
Basophils Absolute: 0.1 10*3/uL (ref 0.0–0.1)
Basophils Relative: 1 % (ref 0–1)
EOS ABS: 0.1 10*3/uL (ref 0.0–0.7)
Eosinophils Relative: 1 % (ref 0–5)
HCT: 35.9 % — ABNORMAL LOW (ref 36.0–46.0)
Hemoglobin: 11.9 g/dL — ABNORMAL LOW (ref 12.0–15.0)
LYMPHS ABS: 3.3 10*3/uL (ref 0.7–4.0)
Lymphocytes Relative: 33 % (ref 12–46)
MCH: 28.5 pg (ref 26.0–34.0)
MCHC: 33.1 g/dL (ref 30.0–36.0)
MCV: 85.9 fL (ref 78.0–100.0)
Monocytes Absolute: 0.5 10*3/uL (ref 0.1–1.0)
Monocytes Relative: 5 % (ref 3–12)
NEUTROS ABS: 6.1 10*3/uL (ref 1.7–7.7)
NEUTROS PCT: 60 % (ref 43–77)
Platelets: 284 10*3/uL (ref 150–400)
RBC: 4.18 MIL/uL (ref 3.87–5.11)
RDW: 13.3 % (ref 11.5–15.5)
WBC: 10 10*3/uL (ref 4.0–10.5)

## 2015-03-15 LAB — URINALYSIS, ROUTINE W REFLEX MICROSCOPIC
Bilirubin Urine: NEGATIVE
Glucose, UA: NEGATIVE mg/dL
HGB URINE DIPSTICK: NEGATIVE
Ketones, ur: NEGATIVE mg/dL
Leukocytes, UA: NEGATIVE
Nitrite: NEGATIVE
Protein, ur: NEGATIVE mg/dL
Specific Gravity, Urine: 1.02 (ref 1.005–1.030)
Urobilinogen, UA: 0.2 mg/dL (ref 0.0–1.0)
pH: 7 (ref 5.0–8.0)

## 2015-03-15 MED ORDER — OXYCODONE-ACETAMINOPHEN 5-325 MG PO TABS
2.0000 | ORAL_TABLET | Freq: Once | ORAL | Status: AC
Start: 2015-03-15 — End: 2015-03-15
  Administered 2015-03-15: 2 via ORAL
  Filled 2015-03-15: qty 2

## 2015-03-15 MED ORDER — KETOROLAC TROMETHAMINE 60 MG/2ML IM SOLN
60.0000 mg | Freq: Once | INTRAMUSCULAR | Status: AC
  Administered 2015-03-15: 60 mg via INTRAMUSCULAR
  Filled 2015-03-15: qty 2

## 2015-03-15 MED ORDER — TRAMADOL HCL 50 MG PO TABS: 50.0000 mg | ORAL_TABLET | Freq: Four times a day (QID) | ORAL | Status: DC | PRN

## 2015-03-15 NOTE — Discharge Instructions (Signed)
Dolor en el flanco  (Flank Pain) El dolor en el flanco se refiere a aquel dolor que se localiza en un lado del cuerpo, entre la zona superior del abdomen y Counsellor. Puede aparecer en un perodo corto de tiempo (agudo) o puede durar mucho tiempo o ser recurrente (crnico). Puede ser leve o intenso. Puede tener diferentes causas.  CAUSAS  Algunas de las causas ms comunes son:   Esguince muscular.   Espasmos musculares.   Problemas en la columna vertebral (enfermedad de los discos).   Infeccin en el pulmn (neumona).   Lquido en los pulmones (edema pulmonar).   Infeccin renal.   Piedras en el rin.   Una erupcin cutnea muy dolorosa causada por el virus de la varicela (herpes zoster).  Enfermedad de la vescula biliar. INSTRUCCIONES PARA EL CUIDADO EN EL HOGAR  Los cuidados en el hogar dependern de la causa del dolor. En general:   Haga reposo segn las indicaciones del mdico.  Debe ingerir gran cantidad de lquido para mantener la orina de tono claro o color amarillo plido.  Tome slo medicamentos de venta libre o recetados, segn las indicaciones del mdico. Algunos medicamentos ayudan a Best boy.  Informe al mdico si tiene algn Risk manager.  Concurra a las visitas de control, segn las indicaciones. SOLICITE ATENCIN MDICA DE INMEDIATO SI:   El dolor no se alivia con los [employer].   Tiene sntomas nuevos o que empeoran.  El dolor [place].   Siente dolor abdominal.   Le falta el aire.   Tiene nuseas o vmitos persistentes.   El abdomen se hincha.   Sufre mareos o se desmaya.   Observa sangre en la orina.  Tiene fiebre o sntomas persistentes durante ms de 2  3 das.  Tiene fiebre y los sntomas empeoran repentinamente. ASEGRESE DE QUE:   Comprende estas instrucciones.  Controlar su enfermedad.  Solicitar ayuda de inmediato si no mejora o si empeora. Document Released: 12/11/2007 Document Revised:  05/28/2012 [facility] Patient Information 2015 [place]. This information is not intended to replace advice given to you by your health care provider. Make sure you discuss any questions you have with your health care provider.  Infeccin urinaria  (Urinary Tract Infection)  La infeccin urinaria puede ocurrir en Clinical cytogeneticist del tracto urinario. El tracto urinario es un sistema de drenaje del cuerpo por el que se eliminan los desechos y el exceso de [place]. El tracto urinario est formado por dos riones, dos urteres, la vejiga y Geologist, engineering. Los riones son rganos que tienen forma de frijol. Cada rin tiene aproximadamente el tamao del puo. Estn situados debajo de las [place], uno a cada lado de la columna vertebral CAUSAS  La causa de la infeccin son los microbios, que son organismos microscpicos, que incluyen hongos, virus, y bacterias. Estos organismos son tan pequeos que slo pueden verse a travs del microscopio. Las bacterias son los microorganismos que ms comnmente causan infecciones urinarias.  SNTOMAS  Los sntomas pueden variar segn la edad y el sexo del paciente y por la ubicacin de la infeccin. Los sntomas en las mujeres jvenes incluyen la necesidad frecuente e intensa de orinar y una sensacin dolorosa de ardor en la vejiga o en la uretra durante la miccin. Las mujeres y los hombres mayores podrn sentir cansancio, temblores y debilidad y Arts development officer musculares y Social research officer, government abdominal. Si tiene [place], puede significar que la infeccin est en los riones. Otros sntomas son dolor en la espalda  o en los lados debajo de las costillas, nuseas y vmitos.  DIAGNSTICO  Para diagnosticar una infeccin urinaria, el mdico le preguntar acerca de sus sntomas. [employer] una [place] de [place]. La muestra de orina se analiza para Hydrographic surveyor bacterias y glbulos blancos de Herbalist. Los glbulos blancos se forman en el organismo para ayudar a Radio broadcast assistant las infecciones.    TRATAMIENTO  Por lo general, las infecciones urinarias pueden tratarse con medicamentos. Debido a que la [employer] de las infecciones son causadas por bacterias, por lo general pueden tratarse con antibiticos. La eleccin del antibitico y la duracin del tratamiento depender de sus sntomas y el tipo de bacteria causante de la infeccin.  INSTRUCCIONES PARA EL CUIDADO EN EL HOGAR   Si le recetaron antibiticos, tmelos exactamente como su mdico le indique. Termine el medicamento aunque se sienta mejor despus de haber tomado slo algunos.  Beba gran cantidad de lquido para mantener la orina de tono claro o color amarillo plido.  Evite la cafena, el t y las bebidas gaseosas. Estas sustancias irritan la vejiga.  Vaciar la vejiga con frecuencia. Evite retener la orina durante largos perodos.  Vace la vejiga antes y despus de Clinical biochemist.  Despus de mover el intestino, las mujeres deben higienizarse la regin perineal desde adelante hacia atrs. Use slo un papel tissue por vez. SOLICITE ATENCIN MDICA SI:   Siente dolor en la espalda.  Le sube la fiebre.  Los sntomas no mejoran luego de 3 das. SOLICITE ATENCIN MDICA DE INMEDIATO SI:   Siente dolor intenso en la espalda o en la zona inferior del abdomen.  Comienza a sentir escalofros.  Tiene nuseas o vmitos.  Tiene una sensacin continua de quemazn o molestias al [employer]. ASEGRESE DE QUE:   Comprende estas instrucciones.  Controlar su enfermedad.  Solicitar ayuda de inmediato si no mejora o empeora. Document Released: 06/13/2005 Document Revised: 05/28/2012 [facility] Patient Information 2015 [place]. This information is not intended to replace advice given to you by your health care provider. Make sure you discuss any questions you have with your health care provider.

## 2015-03-15 NOTE — ED Provider Notes (Signed)
CSN: [id number]     Arrival date & time 03/15/15  1905 History   First MD Initiated Contact with Patient 03/15/15 2140     Chief Complaint  Patient presents with  . Flank Pain     (Consider location/radiation/quality/duration/timing/severity/associated sxs/prior Treatment) HPI Comments: Patient presents to the emergency department for evaluation of right-sided flank pain. Patient reports that she has been expressing ongoing pain in the right side of her back into the right lower abdomen area for 1 week. She has been experiencing pain with urination. No blood in her urine.  Patient is a 29 y.o. female presenting with flank pain.  Flank Pain    Past Medical History  Diagnosis Date  . Diverticulitis   . Pyelonephritis   . Pyelonephritis   . Asthma    Past Surgical History  Procedure Laterality Date  . Cholecystectomy    . Cesarean section    . Cesarean section N/A 2006  . Cesarean section N/A 09/03/2014    Procedure: CESAREAN SECTION;  Surgeon: Guss Bunde, MD;  Location: [facility];  Service: Obstetrics;  Laterality: N/A;  . Tubal ligation     Family History  Problem Relation Age of Onset  . Hyperlipidemia Mother   . Diabetes Maternal Uncle   . Diabetes Paternal Grandmother    History  Substance Use Topics  . Smoking status: Never Smoker   . Smokeless tobacco: Never Used  . Alcohol Use: No   OB History    This patient's OB History needs to be verified. Open OB History to review and resolve any issues.   Gravida Para Term Preterm AB TAB SAB Ectopic Multiple Living   5 3 1 2 2  2  1 2      Review of Systems  Genitourinary: Positive for dysuria and flank pain.  All other systems reviewed and are negative.     Allergies  Review of patient's allergies indicates no known allergies.  Home Medications   Prior to Admission medications   Medication Sig Start Date End Date Taking? Authorizing Provider  acetaminophen (TYLENOL) 500 MG tablet Take 1 tablet (500 mg  total) by mouth every 6 (six) hours as needed (pain). 11/01/14   Simbiso Ranga, MD  albuterol (PROVENTIL HFA;VENTOLIN HFA) 108 (90 BASE) MCG/ACT inhaler Inhale 4 puffs into the lungs every 6 (six) hours as needed for wheezing or shortness of breath. 08/13/14   Mariel Aloe, MD  cephALEXin (KEFLEX) 500 MG capsule Take 1 capsule (500 mg total) by mouth 4 (four) times daily. 03/10/15   Clayton Bibles, PA-C  diphenhydrAMINE (BENADRYL) 25 MG tablet Take 1 tablet (25 mg total) by mouth every 6 (six) hours. 02/17/15   Charlesetta Shanks, MD  ibuprofen (ADVIL,MOTRIN) 600 MG tablet Take 1 tablet (600 mg total) by mouth every 6 (six) hours. Patient taking differently: Take 600 mg by mouth every 6 (six) hours as needed for moderate pain (pain).  09/06/14   Lisa A Leftwich-Kirby, CNM  ibuprofen (ADVIL,MOTRIN) 600 MG tablet Take 1 tablet (600 mg total) by mouth every 6 (six) hours as needed. 02/17/15   Charlesetta Shanks, MD  metoCLOPramide (REGLAN) 10 MG tablet Take 1 tablet (10 mg total) by mouth every 6 (six) hours. 02/17/15   Charlesetta Shanks, MD  ondansetron (ZOFRAN) 4 MG tablet Take 1 tablet (4 mg total) by mouth every 8 (eight) hours as needed for nausea or vomiting. 03/10/15   Clayton Bibles, PA-C  oxyCODONE-acetaminophen (PERCOCET/ROXICET) 5-325 MG per tablet Take 1-2 tablets by mouth  every 4 (four) hours as needed for moderate pain or severe pain. 03/10/15   Clayton Bibles, PA-C  polyethylene glycol powder (GLYCOLAX/MIRALAX) powder Take 17 g by mouth daily. Patient taking differently: Take 17 g by mouth daily as needed for mild constipation (constipation).  11/04/14   Gregor Hams, MD   BP 100/47 mmHg  Pulse 80  Temp(Src) 98.9 F (37.2 C) (Oral)  Resp 20  SpO2 100%  LMP 02/13/2015 Physical Exam  Constitutional: She is oriented to person, place, and time. She appears well-developed and well-nourished. No distress.  HENT:  Head: Normocephalic and atraumatic.  Right Ear: Hearing normal.  Left Ear: Hearing normal.  Nose: Nose  normal.  Mouth/Throat: Oropharynx is clear and moist and mucous membranes are normal.  Eyes: Conjunctivae and EOM are normal. Pupils are equal, round, and reactive to light.  Neck: Normal range of motion. Neck supple.  Cardiovascular: Regular rhythm, S1 normal and S2 normal.  Exam reveals no gallop and no friction rub.   No murmur heard. Pulmonary/Chest: Effort normal and breath sounds normal. No respiratory distress. She exhibits no tenderness.  Abdominal: Soft. Normal appearance and bowel sounds are normal. There is no hepatosplenomegaly. There is no tenderness. There is no rebound, no guarding, no tenderness at McBurney's point and negative Murphy's sign. No hernia.  Musculoskeletal: Normal range of motion.       Right shoulder: She exhibits tenderness.       Arms: Neurological: She is alert and oriented to person, place, and time. She has normal strength. No cranial nerve deficit or sensory deficit. Coordination normal. GCS eye subscore is 4. GCS verbal subscore is 5. GCS motor subscore is 6.  Skin: Skin is warm, dry and intact. No rash noted. No cyanosis.  Psychiatric: She has a normal mood and affect. Her speech is normal and behavior is normal. Thought content normal.  Nursing note and vitals reviewed.   ED Course  Procedures (including critical care time) Labs Review Labs Reviewed  CBC WITH DIFFERENTIAL/PLATELET - Abnormal; Notable for the following:    Hemoglobin 11.9 (*)    HCT 35.9 (*)    All other components within normal limits  COMPREHENSIVE METABOLIC PANEL - Abnormal; Notable for the following:    Total Bilirubin 0.1 (*)    All other components within normal limits  URINALYSIS, ROUTINE W REFLEX MICROSCOPIC (NOT AT [facility] [place]) - Abnormal; Notable for the following:    APPearance HAZY (*)    All other components within normal limits  POC URINE PREG, ED    Imaging Review No results found.   EKG Interpretation None      MDM   Final diagnoses:  None   right flank  pain  UTI  Resents to the emergency room with complaints of right-sided flank pain. Reviewing her records reveals that she was seen in the ER 4 days ago and diagnosed with urinary tract infection. She was treated with Rocephin and outpatient antibiotic. Urinalysis is improved. Reviewing her records further revealed multiple visits with similar presentation. She has had multiple CAT scans, none of which have shown any pathology. I do not believe she has a kidney stone. She does not require any imaging at this time, as abdominal exam is benign. There is no tenderness in the lower abdomen or pelvic region. No concern for acute appendicitis. Patient to minister toward on Percocet here in the ER, will be discharged with analgesia, continue antibiotic.    Orpah Greek, MD 03/15/15 [phone]

## 2015-03-15 NOTE — ED Notes (Signed)
Pt c/o right flank pain and right lower abd pain onset 1 week ago.  Was seen here and dx with uti, given antibiotics but sts not getting any better

## 2015-05-14 ENCOUNTER — Encounter (HOSPITAL_COMMUNITY): Payer: Self-pay | Admitting: Emergency Medicine

## 2015-05-14 ENCOUNTER — Emergency Department (HOSPITAL_COMMUNITY)
Admission: EM | Admit: 2015-05-14 | Discharge: 2015-05-14 | Disposition: A | Discharge status: Home / Self Care | Payer: Self-pay | Attending: Emergency Medicine | Admitting: Emergency Medicine

## 2015-05-14 ENCOUNTER — Emergency Department (HOSPITAL_COMMUNITY): Payer: Self-pay

## 2015-05-14 DIAGNOSIS — Z8719 Personal history of other diseases of the digestive system: Secondary | ICD-10-CM | POA: Insufficient documentation

## 2015-05-14 DIAGNOSIS — Z8742 Personal history of other diseases of the female genital tract: Secondary | ICD-10-CM | POA: Insufficient documentation

## 2015-05-14 DIAGNOSIS — R1031 Right lower quadrant pain: Secondary | ICD-10-CM | POA: Insufficient documentation

## 2015-05-14 DIAGNOSIS — J45909 Unspecified asthma, uncomplicated: Secondary | ICD-10-CM | POA: Insufficient documentation

## 2015-05-14 DIAGNOSIS — M546 Pain in thoracic spine: Secondary | ICD-10-CM | POA: Insufficient documentation

## 2015-05-14 DIAGNOSIS — Z87440 Personal history of urinary (tract) infections: Secondary | ICD-10-CM | POA: Insufficient documentation

## 2015-05-14 DIAGNOSIS — R300 Dysuria: Secondary | ICD-10-CM | POA: Insufficient documentation

## 2015-05-14 DIAGNOSIS — R350 Frequency of micturition: Secondary | ICD-10-CM | POA: Insufficient documentation

## 2015-05-14 DIAGNOSIS — R1011 Right upper quadrant pain: Secondary | ICD-10-CM | POA: Insufficient documentation

## 2015-05-14 DIAGNOSIS — R1110 Vomiting, unspecified: Secondary | ICD-10-CM | POA: Insufficient documentation

## 2015-05-14 DIAGNOSIS — Z792 Long term (current) use of antibiotics: Secondary | ICD-10-CM | POA: Insufficient documentation

## 2015-05-14 DIAGNOSIS — R102 Pelvic and perineal pain: Secondary | ICD-10-CM | POA: Insufficient documentation

## 2015-05-14 DIAGNOSIS — Z79899 Other long term (current) drug therapy: Secondary | ICD-10-CM | POA: Insufficient documentation

## 2015-05-14 LAB — CBC WITH DIFFERENTIAL/PLATELET
BASOS ABS: 0 10*3/uL (ref 0.0–0.1)
Basophils Relative: 1 % (ref 0–1)
Eosinophils Absolute: 0.2 10*3/uL (ref 0.0–0.7)
Eosinophils Relative: 2 % (ref 0–5)
HEMATOCRIT: 36.3 % (ref 36.0–46.0)
HEMOGLOBIN: 11.9 g/dL — AB (ref 12.0–15.0)
LYMPHS PCT: 37 % (ref 12–46)
Lymphs Abs: 2.8 10*3/uL (ref 0.7–4.0)
MCH: 28.5 pg (ref 26.0–34.0)
MCHC: 32.8 g/dL (ref 30.0–36.0)
MCV: 87.1 fL (ref 78.0–100.0)
Monocytes Absolute: 0.7 10*3/uL (ref 0.1–1.0)
Monocytes Relative: 9 % (ref 3–12)
NEUTROS ABS: 4 10*3/uL (ref 1.7–7.7)
Neutrophils Relative %: 51 % (ref 43–77)
Platelets: 279 10*3/uL (ref 150–400)
RBC: 4.17 MIL/uL (ref 3.87–5.11)
RDW: 13.3 % (ref 11.5–15.5)
WBC: 7.7 10*3/uL (ref 4.0–10.5)

## 2015-05-14 LAB — URINALYSIS, ROUTINE W REFLEX MICROSCOPIC
Bilirubin Urine: NEGATIVE
Glucose, UA: NEGATIVE mg/dL
Hgb urine dipstick: NEGATIVE
Ketones, ur: NEGATIVE mg/dL
LEUKOCYTES UA: NEGATIVE
Nitrite: NEGATIVE
Protein, ur: NEGATIVE mg/dL
Specific Gravity, Urine: 1.022 (ref 1.005–1.030)
UROBILINOGEN UA: 0.2 mg/dL (ref 0.0–1.0)
pH: 6.5 (ref 5.0–8.0)

## 2015-05-14 LAB — I-STAT CHEM 8, ED
BUN: 13 mg/dL (ref 6–20)
CHLORIDE: 104 mmol/L (ref 101–111)
CREATININE: 0.6 mg/dL (ref 0.44–1.00)
Calcium, Ion: 1.22 mmol/L (ref 1.12–1.23)
GLUCOSE: 86 mg/dL (ref 65–99)
HCT: 39 % (ref 36.0–46.0)
Hemoglobin: 13.3 g/dL (ref 12.0–15.0)
POTASSIUM: 3.3 mmol/L — AB (ref 3.5–5.1)
Sodium: 141 mmol/L (ref 135–145)
TCO2: 25 mmol/L (ref 0–100)

## 2015-05-14 LAB — WET PREP, GENITAL
Clue Cells Wet Prep HPF POC: NONE SEEN
Trich, Wet Prep: NONE SEEN
WBC, Wet Prep HPF POC: NONE SEEN
YEAST WET PREP: NONE SEEN

## 2015-05-14 MED ORDER — NAPROXEN 375 MG PO TABS: 375.0000 mg | ORAL_TABLET | Freq: Two times a day (BID) | ORAL | Status: DC

## 2015-05-14 MED ORDER — KETOROLAC TROMETHAMINE 30 MG/ML IJ SOLN
30.0000 mg | Freq: Once | INTRAMUSCULAR | Status: AC
  Administered 2015-05-14: 30 mg via INTRAVENOUS
  Filled 2015-05-14: qty 1

## 2015-05-14 MED ORDER — IBUPROFEN 600 MG PO TABS: 600.0000 mg | ORAL_TABLET | Freq: Three times a day (TID) | ORAL | Status: DC | PRN

## 2015-05-14 NOTE — ED Notes (Signed)
MD at bedside. 

## 2015-05-14 NOTE — ED Notes (Signed)
Added on urine culture.

## 2015-05-14 NOTE — ED Provider Notes (Signed)
CSN: [id number]     Arrival date & time 05/14/15  0818 History   First MD Initiated Contact with Patient 05/14/15 [phone]     Chief Complaint  Patient presents with  . Flank Pain     (Consider location/radiation/quality/duration/timing/severity/associated sxs/prior Treatment) HPI Comments: Patient presents with right flank pain. History is obtained through the patient's daughter who is interpreting. She states that she's had a 4 day history of pain to her right mid back radiating to her right lower abdomen. She's had some burning on urination and urinary frequency. She denies any known fevers. There is no nausea or vomiting other than she had one episode of vomiting yesterday. She's had similar symptoms in the past with a UTI. She denies any history of kidney stones. She denies any vaginal bleeding or discharge. She does have a past history of a left ovarian cyst. Her last ultrasound in the ED showed a complex appearing cyst that was recommended to get outpatient follow-up with a repeat ultrasound in 3-4 months. I did question the patient about this and she states that she has followed up with the doctor and had a repeat ultrasound since that time. Patient had recurrent episodes of pain to her abdomen, particularly in the right flank area. She's had multiple CT scans over the last 2 years as well as pelvic ultrasounds which are unremarkable other than the ovarian cyst.  Patient is a 29 y.o. female presenting with flank pain.  Flank Pain Associated symptoms include abdominal pain. Pertinent negatives include no chest pain, no headaches and no shortness of breath.    Past Medical History  Diagnosis Date  . Diverticulitis   . Pyelonephritis   . Pyelonephritis   . Asthma    Past Surgical History  Procedure Laterality Date  . Cholecystectomy    . Cesarean section    . Cesarean section N/A 2006  . Cesarean section N/A 09/03/2014    Procedure: CESAREAN SECTION;  Surgeon: Guss Bunde, MD;   Location: [facility];  Service: Obstetrics;  Laterality: N/A;  . Tubal ligation     Family History  Problem Relation Age of Onset  . Hyperlipidemia Mother   . Diabetes Maternal Uncle   . Diabetes Paternal Grandmother    Social History  Substance Use Topics  . Smoking status: Never Smoker   . Smokeless tobacco: Never Used  . Alcohol Use: No   OB History    This patient's OB History needs to be verified. Open OB History to review and resolve any issues.   Gravida Para Term Preterm AB TAB SAB Ectopic Multiple Living   5 3 1 2 2  2  1 2      Review of Systems  Constitutional: Negative for fever, chills, diaphoresis and fatigue.  HENT: Negative for congestion, rhinorrhea and sneezing.   Eyes: Negative.   Respiratory: Negative for cough, chest tightness and shortness of breath.   Cardiovascular: Negative for chest pain and leg swelling.  Gastrointestinal: Positive for nausea, vomiting and abdominal pain. Negative for diarrhea and blood in stool.  Genitourinary: Positive for dysuria and flank pain. Negative for frequency, hematuria, vaginal bleeding, vaginal discharge and difficulty urinating.  Musculoskeletal: Negative for back pain and arthralgias.  Skin: Negative for rash.  Neurological: Negative for dizziness, speech difficulty, weakness, numbness and headaches.      Allergies  Review of patient's allergies indicates no known allergies.  Home Medications   Prior to Admission medications   Medication Sig Start Date End Date Taking?  Authorizing Provider  acetaminophen (TYLENOL) 500 MG tablet Take 1 tablet (500 mg total) by mouth every 6 (six) hours as needed (pain). 11/01/14   Simbiso Ranga, MD  albuterol (PROVENTIL HFA;VENTOLIN HFA) 108 (90 BASE) MCG/ACT inhaler Inhale 4 puffs into the lungs every 6 (six) hours as needed for wheezing or shortness of breath. 08/13/14   Mariel Aloe, MD  cephALEXin (KEFLEX) 500 MG capsule Take 1 capsule (500 mg total) by mouth 4 (four) times daily.  03/10/15   Clayton Bibles, PA-C  diphenhydrAMINE (BENADRYL) 25 MG tablet Take 1 tablet (25 mg total) by mouth every 6 (six) hours. 02/17/15   Charlesetta Shanks, MD  ibuprofen (ADVIL,MOTRIN) 600 MG tablet Take 1 tablet (600 mg total) by mouth every 8 (eight) hours as needed for moderate pain. 05/14/15   Malvin Johns, MD  metoCLOPramide (REGLAN) 10 MG tablet Take 1 tablet (10 mg total) by mouth every 6 (six) hours. 02/17/15   Charlesetta Shanks, MD  ondansetron (ZOFRAN) 4 MG tablet Take 1 tablet (4 mg total) by mouth every 8 (eight) hours as needed for nausea or vomiting. 03/10/15   Clayton Bibles, PA-C  oxyCODONE-acetaminophen (PERCOCET/ROXICET) 5-325 MG per tablet Take 1-2 tablets by mouth every 4 (four) hours as needed for moderate pain or severe pain. 03/10/15   Clayton Bibles, PA-C  polyethylene glycol powder (GLYCOLAX/MIRALAX) powder Take 17 g by mouth daily. Patient taking differently: Take 17 g by mouth daily as needed for mild constipation (constipation).  11/04/14   Gregor Hams, MD  traMADol (ULTRAM) 50 MG tablet Take 1 tablet (50 mg total) by mouth every 6 (six) hours as needed. 03/15/15   Orpah Greek, MD   BP 111/60 mmHg  Pulse 62  Temp(Src) 97.8 F (36.6 C) (Oral)  Resp 12  SpO2 100%  LMP 04/13/2015 Physical Exam  Constitutional: She is oriented to person, place, and time. She appears well-developed and well-nourished.  HENT:  Head: Normocephalic and atraumatic.  Eyes: Pupils are equal, round, and reactive to light.  Neck: Normal range of motion. Neck supple.  Cardiovascular: Normal rate, regular rhythm and normal heart sounds.   Pulmonary/Chest: Effort normal and breath sounds normal. No respiratory distress. She has no wheezes. She has no rales. She exhibits no tenderness.  Abdominal: Soft. Bowel sounds are normal. There is tenderness (+tenderness to right flank and right lower abdomen.  no pain to RUQ). There is no rebound and no guarding.  Genitourinary:  No significant abnormal appearing  discharge, no bleeding, no CMT, +right adnexal tenderness  Musculoskeletal: Normal range of motion. She exhibits no edema.  Lymphadenopathy:    She has no cervical adenopathy.  Neurological: She is alert and oriented to person, place, and time.  Skin: Skin is warm and dry. No rash noted.  Psychiatric: She has a normal mood and affect.    ED Course  Procedures (including critical care time) Labs Review Labs Reviewed  CBC WITH DIFFERENTIAL/PLATELET - Abnormal; Notable for the following:    Hemoglobin 11.9 (*)    All other components within normal limits  I-STAT CHEM 8, ED - Abnormal; Notable for the following:    Potassium 3.3 (*)    All other components within normal limits  WET PREP, GENITAL  URINE CULTURE  URINALYSIS, ROUTINE W REFLEX MICROSCOPIC (NOT AT [facility])  HIV ANTIBODY (ROUTINE TESTING)  RPR  POC URINE PREG, ED  GC/CHLAMYDIA PROBE AMP ([place]) NOT AT [facility]    Imaging Review US Transvaginal Non-ob  05/14/2015  CLINICAL DATA:  Right lower quadrant pain and right flank pain for 1 day.  EXAM: TRANSABDOMINAL AND TRANSVAGINAL ULTRASOUND OF PELVIS  DOPPLER ULTRASOUND OF OVARIES  TECHNIQUE: Both transabdominal and transvaginal ultrasound examinations of the pelvis were performed. Transabdominal technique was performed for global imaging of the pelvis including uterus, ovaries, adnexal regions, and pelvic cul-de-sac.  It was necessary to proceed with endovaginal exam following the transabdominal exam to visualize the uterus, endometrium and ovaries to better advantage. Color and duplex Doppler ultrasound was utilized to evaluate blood flow to the ovaries.  COMPARISON:  10/30/2014  FINDINGS: Uterus  Measurements: 7.5 x 4.2 x 5.8 cm. No fibroids or other mass visualized.  Endometrium  Thickness: 9.5 mm.  No focal abnormality visualized.  Right ovary  Measurements: 3.9 x 2.8 x 3.8 cm. Normal appearance/no adnexal mass.  Left ovary  Measurements: 3.8 x 1.7 x 3.1 cm. Normal appearance/no  adnexal mass.  Pulsed Doppler evaluation of both ovaries demonstrates normal low-resistance arterial and venous waveforms.  Other findings  No free fluid.  IMPRESSION: Normal transabdominal and endovaginal pelvic ultrasound. No ovarian torsion.   Electronically Signed   By: Lajean Manes M.D.   On: 05/14/2015 12:38   US Pelvis Complete  05/14/2015   CLINICAL DATA:  Right lower quadrant pain and right flank pain for 1 day.  EXAM: TRANSABDOMINAL AND TRANSVAGINAL ULTRASOUND OF PELVIS  DOPPLER ULTRASOUND OF OVARIES  TECHNIQUE: Both transabdominal and transvaginal ultrasound examinations of the pelvis were performed. Transabdominal technique was performed for global imaging of the pelvis including uterus, ovaries, adnexal regions, and pelvic cul-de-sac.  It was necessary to proceed with endovaginal exam following the transabdominal exam to visualize the uterus, endometrium and ovaries to better advantage. Color and duplex Doppler ultrasound was utilized to evaluate blood flow to the ovaries.  COMPARISON:  10/30/2014  FINDINGS: Uterus  Measurements: 7.5 x 4.2 x 5.8 cm. No fibroids or other mass visualized.  Endometrium  Thickness: 9.5 mm.  No focal abnormality visualized.  Right ovary  Measurements: 3.9 x 2.8 x 3.8 cm. Normal appearance/no adnexal mass.  Left ovary  Measurements: 3.8 x 1.7 x 3.1 cm. Normal appearance/no adnexal mass.  Pulsed Doppler evaluation of both ovaries demonstrates normal low-resistance arterial and venous waveforms.  Other findings  No free fluid.  IMPRESSION: Normal transabdominal and endovaginal pelvic ultrasound. No ovarian torsion.   Electronically Signed   By: Lajean Manes M.D.   On: 05/14/2015 12:38   [place] Art/ven Flow Abd Pelv Doppler  05/14/2015   CLINICAL DATA:  Right lower quadrant pain and right flank pain for 1 day.  EXAM: TRANSABDOMINAL AND TRANSVAGINAL ULTRASOUND OF PELVIS  DOPPLER ULTRASOUND OF OVARIES  TECHNIQUE: Both transabdominal and transvaginal ultrasound examinations of  the pelvis were performed. Transabdominal technique was performed for global imaging of the pelvis including uterus, ovaries, adnexal regions, and pelvic cul-de-sac.  It was necessary to proceed with endovaginal exam following the transabdominal exam to visualize the uterus, endometrium and ovaries to better advantage. Color and duplex Doppler ultrasound was utilized to evaluate blood flow to the ovaries.  COMPARISON:  10/30/2014  FINDINGS: Uterus  Measurements: 7.5 x 4.2 x 5.8 cm. No fibroids or other mass visualized.  Endometrium  Thickness: 9.5 mm.  No focal abnormality visualized.  Right ovary  Measurements: 3.9 x 2.8 x 3.8 cm. Normal appearance/no adnexal mass.  Left ovary  Measurements: 3.8 x 1.7 x 3.1 cm. Normal appearance/no adnexal mass.  Pulsed Doppler evaluation of both ovaries demonstrates normal low-resistance  arterial and venous waveforms.  Other findings  No free fluid.  IMPRESSION: Normal transabdominal and endovaginal pelvic ultrasound. No ovarian torsion.   Electronically Signed   By: Lajean Manes M.D.   On: 05/14/2015 12:38   I have personally reviewed and evaluated these images and lab results as part of my medical decision-making.   EKG Interpretation None      MDM   Final diagnoses:  Pelvic pain in female    Patient presents with right flank pain. Her urine is unremarkable. It was sent for culture. Pelvic exam was done which does not show any significant discharge however she did have tenderness in her right adnexa. A pelvic ultrasound was done which is normal. There is no evidence of torsion or cysts.  She's had similar unexplained pain in the past although at times it's been related to UTIs. I will give her referral to women's outpatient clinic and advised to return here for symptoms worsen. Discharge instructions were given via the Spanish interpreter phone.    Malvin Johns, MD 05/14/15 [phone]

## 2015-05-14 NOTE — ED Notes (Signed)
Pt/translator(daughter) stated, She's having right flank pain since yesterday. And it hurts when she pees.

## 2015-05-14 NOTE — Discharge Instructions (Signed)
Dolor plvico  (Pelvic Pain)  Las causa del dolor plvico en la mujer pueden ser muchas y pueden tener su origen en diferentes lugares. El dolor plvico es el que aparece en la mitad inferior del abdomen y [place] caderas. Puede aparecer durante en un perodo corto de tiempo (agudo)o puede ser recurrente (crnico). Esta afeccin puede estar relacionada con trastornos que afectan a los rganos reproductivos femeninos (ginecolgica), pero tambin puede deberse a problemas en la vejiga, clculos renales, complicaciones intestinales, o problemas musculares o esquelticos. Es Materials engineer ayuda de inmediato, sobre todo si ha sido intenso, [place], o ha aparecido de Geographical information systems officer sbita como un dolor inusual. Tambin es importante obtener ayuda de inmediato, ya que algunos tipos de dolor plvico puede poner en peligro la vida.  CAUSAS  A continuacin veremos algunas de las causas del dolor plvico. Las causas pueden clasificarse de diferentes modos.   Ginecolgica.  Enfermedad inflamatoria plvica.  Infecciones de transmisin sexual.  Quiste de ovario o torsin de un ligamento ovrico ( torsin ovrica).  La membrana que recubre internamente al tero desarrollndose fuera del tero (endometriosis).  Fibromas, quistes o tumores.  Ovulacin.  Embarazo.  Embarazo fuera del tero (embarazo ectpico).  Aborto espontneo.  Trabajo de [place].  Desprendimiento de la placenta o ruptura del tero.  Infecciones.  Infeccin uterina (endometritis).  Infeccin de la vejiga.  Diverticulitis.  Aborto relacionado con una infeccin uterina (aborto sptico).  Vejiga.  Inflamacin de la vejiga (cistitis).  Clculos renales.  Gastrointenstinal.  Estreimiento.  Diverticulitis.  Neurolgico.  Traumatismos.  Sentir dolor plvico debido a causas mentales o emocionales (psicosomtico).  Tumores en el intestino o en la pelvis. EVALUACIN  El mdico har una historia clnica detallada  segn sus sntomas. Incluir los cambios recientes en su salud, una cuidadosa historia ginecolgica de sus periodos (menstruaciones) y Ardelia Mems historia de su actividad sexual. Los antecedentes familiares y la historia clnica tambin son importantes. Su mdico podr indicar un examen plvico. El examen plvico ayudar a identificar la ubicacin y la gravedad del Social research officer, government. Tambin ayudar a [employer] rganos que pueden estar involucrados. Romie Minus identificar la causa del dolor plvico y tratarlo adecuadamente, el mdico puede indicar estudios. Estas pruebas pueden ser:   Test de embarazo.  Ecografa plvica.  Radiografa del abdomen.  Un anlisis de [place] o la evaluacin de la secrecin vaginal.  Anlisis de [place]. INSTRUCCIONES PARA EL CUIDADO EN EL HOGAR   Solo tome medicamentos de venta libre o recetados para Conservation officer, historic buildings, Tree surgeon o fiebre, segn las indicaciones del mdico.   Haga reposo segn las indicaciones del mdico.   Consuma una dieta balanceada.   Beba gran cantidad de lquido para mantener la orina de tono claro o amarillo plido.   Evite las relaciones sexuales, Higher education careers adviser.   Aplique compresas calientes o fras en la zona baja del abdomen segn cual le calme el dolor.   Evite las situaciones estresantes.   Lleve un registro del dolor plvico. Anote cundo comenz, dnde se Midwife y si hay cosas que parecen estar asociadas con el dolor, como algn alimento o su ciclo menstrual.  Concurra a las visitas de control con el mdico, segn las indicaciones.  SOLICITE ATENCIN MDICA SI:   Los medicamentos no Buyer, retail.  Tiene flujo vaginal anormal. SOLICITE ATENCIN MDICA DE INMEDIATO SI:   Tiene un sangrado abundante por la vagina.   El dolor plvico aumenta.   Se siente mareada o sufre un desmayo.  Siente escalofros.   Siente dolor intenso al Garment/textile technologist u observa sangre en la orina.   [place] que no puede  controlar.   Tiene fiebre o sntomas que persisten durante ms de 3 das.  Tiene fiebre y los sntomas empeoran.   Ha sido abusada fsica o sexualmente.  ASEGRESE DE QUE:   Comprende estas instrucciones.  Controlar su enfermedad.  Solicitar ayuda de inmediato si no mejora o si empeora. Document Released: 11/30/2008 Document Revised: 01/18/2014 [facility] Patient Information 2015 [place]. This information is not intended to replace advice given to you by your health care provider. Make sure you discuss any questions you have with your health care provider.

## 2015-05-15 LAB — RPR: RPR Ser Ql: NONREACTIVE

## 2015-05-15 LAB — HIV ANTIBODY (ROUTINE TESTING W REFLEX): HIV Screen 4th Generation wRfx: NONREACTIVE

## 2015-05-16 LAB — URINE CULTURE

## 2015-05-16 LAB — GC/CHLAMYDIA PROBE AMP (~~LOC~~) NOT AT ARMC
Chlamydia: NEGATIVE
NEISSERIA GONORRHEA: NEGATIVE

## 2015-05-18 ENCOUNTER — Emergency Department (HOSPITAL_COMMUNITY): Payer: Self-pay

## 2015-05-18 ENCOUNTER — Encounter (HOSPITAL_COMMUNITY): Payer: Self-pay | Admitting: Family Medicine

## 2015-05-18 ENCOUNTER — Emergency Department (HOSPITAL_COMMUNITY)
Admission: EM | Admit: 2015-05-18 | Discharge: 2015-05-18 | Disposition: A | Discharge status: Home / Self Care | Payer: Self-pay | Attending: Emergency Medicine | Admitting: Emergency Medicine

## 2015-05-18 DIAGNOSIS — R079 Chest pain, unspecified: Secondary | ICD-10-CM

## 2015-05-18 DIAGNOSIS — Z8719 Personal history of other diseases of the digestive system: Secondary | ICD-10-CM | POA: Insufficient documentation

## 2015-05-18 DIAGNOSIS — Z3202 Encounter for pregnancy test, result negative: Secondary | ICD-10-CM | POA: Insufficient documentation

## 2015-05-18 DIAGNOSIS — R55 Syncope and collapse: Secondary | ICD-10-CM | POA: Insufficient documentation

## 2015-05-18 DIAGNOSIS — R0789 Other chest pain: Secondary | ICD-10-CM | POA: Insufficient documentation

## 2015-05-18 DIAGNOSIS — R1084 Generalized abdominal pain: Secondary | ICD-10-CM | POA: Insufficient documentation

## 2015-05-18 DIAGNOSIS — J45901 Unspecified asthma with (acute) exacerbation: Secondary | ICD-10-CM | POA: Insufficient documentation

## 2015-05-18 DIAGNOSIS — Z87448 Personal history of other diseases of urinary system: Secondary | ICD-10-CM | POA: Insufficient documentation

## 2015-05-18 DIAGNOSIS — R61 Generalized hyperhidrosis: Secondary | ICD-10-CM | POA: Insufficient documentation

## 2015-05-18 DIAGNOSIS — R112 Nausea with vomiting, unspecified: Secondary | ICD-10-CM | POA: Insufficient documentation

## 2015-05-18 LAB — CBC
HCT: 37.4 % (ref 36.0–46.0)
Hemoglobin: 12.3 g/dL (ref 12.0–15.0)
MCH: 28.4 pg (ref 26.0–34.0)
MCHC: 32.9 g/dL (ref 30.0–36.0)
MCV: 86.4 fL (ref 78.0–100.0)
PLATELETS: 299 10*3/uL (ref 150–400)
RBC: 4.33 MIL/uL (ref 3.87–5.11)
RDW: 13.2 % (ref 11.5–15.5)
WBC: 9.3 10*3/uL (ref 4.0–10.5)

## 2015-05-18 LAB — D-DIMER, QUANTITATIVE (NOT AT ARMC)

## 2015-05-18 LAB — URINALYSIS, ROUTINE W REFLEX MICROSCOPIC
BILIRUBIN URINE: NEGATIVE
Glucose, UA: NEGATIVE mg/dL
HGB URINE DIPSTICK: NEGATIVE
KETONES UR: 40 mg/dL — AB
Leukocytes, UA: NEGATIVE
Nitrite: NEGATIVE
PROTEIN: NEGATIVE mg/dL
Specific Gravity, Urine: 1.017 (ref 1.005–1.030)
UROBILINOGEN UA: 0.2 mg/dL (ref 0.0–1.0)
pH: 6.5 (ref 5.0–8.0)

## 2015-05-18 LAB — I-STAT TROPONIN, ED
TROPONIN I, POC: 0 ng/mL (ref 0.00–0.08)
TROPONIN I, POC: 0 ng/mL (ref 0.00–0.08)

## 2015-05-18 LAB — BASIC METABOLIC PANEL
Anion gap: 10 (ref 5–15)
BUN: 8 mg/dL (ref 6–20)
CO2: 25 mmol/L (ref 22–32)
CREATININE: 0.73 mg/dL (ref 0.44–1.00)
Calcium: 9.1 mg/dL (ref 8.9–10.3)
Chloride: 103 mmol/L (ref 101–111)
Glucose, Bld: 101 mg/dL — ABNORMAL HIGH (ref 65–99)
POTASSIUM: 3.2 mmol/L — AB (ref 3.5–5.1)
SODIUM: 138 mmol/L (ref 135–145)

## 2015-05-18 LAB — LIPASE, BLOOD: LIPASE: 16 U/L — AB (ref 22–51)

## 2015-05-18 LAB — HEPATIC FUNCTION PANEL
ALT: 65 U/L — ABNORMAL HIGH (ref 14–54)
AST: 57 U/L — ABNORMAL HIGH (ref 15–41)
Albumin: 4.1 g/dL (ref 3.5–5.0)
Alkaline Phosphatase: 95 U/L (ref 38–126)
TOTAL PROTEIN: 7.6 g/dL (ref 6.5–8.1)
Total Bilirubin: 0.5 mg/dL (ref 0.3–1.2)

## 2015-05-18 LAB — PREGNANCY, URINE: PREG TEST UR: NEGATIVE

## 2015-05-18 MED ORDER — MORPHINE SULFATE (PF) 4 MG/ML IV SOLN
4.0000 mg | Freq: Once | INTRAVENOUS | Status: AC
  Administered 2015-05-18: 4 mg via INTRAVENOUS
  Filled 2015-05-18: qty 1

## 2015-05-18 MED ORDER — SODIUM CHLORIDE 0.9 % IV BOLUS (SEPSIS)
1000.0000 mL | Freq: Once | INTRAVENOUS | Status: AC
  Administered 2015-05-18: 1000 mL via INTRAVENOUS

## 2015-05-18 MED ORDER — IOHEXOL 300 MG/ML  SOLN
100.0000 mL | Freq: Once | INTRAMUSCULAR | Status: AC | PRN
  Administered 2015-05-18: 100 mL via INTRAVENOUS

## 2015-05-18 MED ORDER — KETOROLAC TROMETHAMINE 30 MG/ML IJ SOLN
30.0000 mg | Freq: Once | INTRAMUSCULAR | Status: AC
  Administered 2015-05-18: 30 mg via INTRAVENOUS
  Filled 2015-05-18: qty 1

## 2015-05-18 MED ORDER — ONDANSETRON HCL 4 MG/2ML IJ SOLN
4.0000 mg | Freq: Once | INTRAMUSCULAR | Status: AC
  Administered 2015-05-18: 4 mg via INTRAVENOUS
  Filled 2015-05-18: qty 2

## 2015-05-18 NOTE — Discharge Instructions (Signed)
Dolor abdominal (Abdominal Pain) El dolor puede tener muchas causas. Normalmente la causa del dolor abdominal no es una enfermedad y Teacher, English as a foreign language sin Clinical research associate. Frecuentemente puede controlarse y tratarse en casa. Su mdico le Chartered certified accountant examen fsico y posiblemente solicite anlisis de sangre y radiografas para ayudar a Teacher, adult education la gravedad de su dolor. Sin embargo, en [employer], debe transcurrir ms tiempo antes de que se pueda Pension scheme manager una causa evidente del dolor. Antes de llegar a ese punto, es posible que su mdico no sepa si necesita ms pruebas o un tratamiento ms profundo. INSTRUCCIONES PARA EL CUIDADO EN EL HOGAR  Est atento al dolor para ver si hay cambios. Las siguientes indicaciones ayudarn a Chief Strategy Officer que pueda sentir:  [place] solo medicamentos de venta libre o recetados, segn las indicaciones del mdico.  No tome laxantes a menos que se lo haya indicado su mdico.  Pruebe con Ardelia Mems dieta lquida absoluta (caldo, t o agua) segn se lo indique su mdico. Introduzca gradualmente una dieta normal, segn su tolerancia. SOLICITE ATENCIN MDICA SI:  Tiene dolor abdominal sin explicacin.  Tiene dolor abdominal relacionado con nuseas o diarrea.  Tiene dolor cuando orina o defeca.  Experimenta dolor abdominal que lo despierta de noche.  Tiene dolor abdominal que empeora o mejora cuando come alimentos.  Tiene dolor abdominal que empeora cuando come alimentos grasosos.  Tiene fiebre. SOLICITE ATENCIN MDICA DE INMEDIATO SI:   El dolor no desaparece en un plazo mximo de 2horas.  No deja de (vomitar).  El Social research officer, government se siente solo en partes del abdomen, como el lado derecho o la parte inferior izquierda del abdomen.  Evaca materia fecal sanguinolenta o negra, de aspecto alquitranado. ASEGRESE DE QUE:  Comprende estas instrucciones.  Controlar su afeccin.  Recibir ayuda de inmediato si no mejora o si empeora. Document Released: 09/03/2005  Document Revised: 09/08/2013 [facility] Patient Information 2015 [place]. This information is not intended to replace advice given to you by your health care provider. Make sure you discuss any questions you have with your health care provider.

## 2015-05-18 NOTE — ED Notes (Signed)
Per pt sts chest pain since yesterday. sts the pain is constant. Denies cough, fever

## 2015-05-18 NOTE — ED Notes (Signed)
Patient transported to CT 

## 2015-05-18 NOTE — ED Notes (Signed)
Patient vomiting at this time. MD notified

## 2015-05-18 NOTE — ED Provider Notes (Signed)
CSN: [id number]     Arrival date & time 05/18/15  1352 History   First MD Initiated Contact with Patient 05/18/15 1547     Chief Complaint  Patient presents with  . Chest Pain     (Consider location/radiation/quality/duration/timing/severity/associated sxs/prior Treatment) Patient is a 29 y.o. female presenting with chest pain.  Chest Pain Pain location:  L chest Pain quality: pressure   Radiates to: right flank. Duration:  2 days Timing:  Constant Progression:  Worsening Chronicity:  New Relieved by:  None tried Worsened by:  Deep breathing Ineffective treatments:  None tried Associated symptoms: diaphoresis, nausea, near-syncope, shortness of breath and vomiting   Associated symptoms: no abdominal pain, no cough, no fever and no headache   Risk factors: no diabetes mellitus, no high cholesterol, no hypertension and no immobilization   Risk factors comment:  Asthma   Past Medical History  Diagnosis Date  . Diverticulitis   . Pyelonephritis   . Pyelonephritis   . Asthma    Past Surgical History  Procedure Laterality Date  . Cholecystectomy    . Cesarean section    . Cesarean section N/A 2006  . Cesarean section N/A 09/03/2014    Procedure: CESAREAN SECTION;  Surgeon: Guss Bunde, MD;  Location: [facility];  Service: Obstetrics;  Laterality: N/A;  . Tubal ligation     Family History  Problem Relation Age of Onset  . Hyperlipidemia Mother   . Diabetes Maternal Uncle   . Diabetes Paternal Grandmother    Social History  Substance Use Topics  . Smoking status: Never Smoker   . Smokeless tobacco: Never Used  . Alcohol Use: No   OB History    This patient's OB History needs to be verified. Open OB History to review and resolve any issues.   Gravida Para Term Preterm AB TAB SAB Ectopic Multiple Living   5 3 1 2 2  2  1 2      Review of Systems  Constitutional: Positive for diaphoresis. Negative for fever and chills.  HENT: Negative for congestion and sore  throat.   Eyes: Negative for visual disturbance.  Respiratory: Positive for chest tightness and shortness of breath. Negative for cough and wheezing.   Cardiovascular: Positive for chest pain and near-syncope.  Gastrointestinal: Positive for nausea and vomiting. Negative for abdominal pain, diarrhea and constipation.  Genitourinary: Negative for dysuria, difficulty urinating and vaginal pain.  Musculoskeletal: Negative for myalgias and arthralgias.  Skin: Negative for rash.  Neurological: Negative for syncope and headaches.  Psychiatric/Behavioral: Negative for behavioral problems.  All other systems reviewed and are negative.     Allergies  Review of patient's allergies indicates no known allergies.  Home Medications   Prior to Admission medications   Medication Sig Start Date End Date Taking? Authorizing Provider  albuterol (PROVENTIL HFA;VENTOLIN HFA) 108 (90 BASE) MCG/ACT inhaler Inhale 4 puffs into the lungs every 6 (six) hours as needed for wheezing or shortness of breath. 08/13/14  Yes Mariel Aloe, MD  ibuprofen (ADVIL,MOTRIN) 600 MG tablet Take 1 tablet (600 mg total) by mouth every 8 (eight) hours as needed for moderate pain. 05/14/15  Yes Malvin Johns, MD   BP 117/58 mmHg  Pulse 86  Temp(Src) 98.3 F (36.8 C)  Resp 34  SpO2 100%  LMP 04/13/2015 Physical Exam  Constitutional: She is oriented to person, place, and time. She appears well-developed and well-nourished. No distress.  HENT:  Head: Normocephalic and atraumatic.  Eyes: EOM are normal.  Neck:  Normal range of motion.  Cardiovascular: Normal rate, regular rhythm and normal heart sounds.   No murmur heard. Pulmonary/Chest: Effort normal and breath sounds normal. No respiratory distress. She has no wheezes.  Abdominal: Soft. There is no splenomegaly or hepatomegaly. There is tenderness. There is CVA tenderness and positive Murphy's sign. There is no rigidity, no rebound and no guarding.  Musculoskeletal: She  exhibits no edema.  Neurological: She is alert and oriented to person, place, and time.  Skin: She is not diaphoretic.  Psychiatric: She has a normal mood and affect. Her behavior is normal.    ED Course  Procedures (including critical care time) Labs Review Labs Reviewed  BASIC METABOLIC PANEL - Abnormal; Notable for the following:    Potassium 3.2 (*)    Glucose, Bld 101 (*)    All other components within normal limits  URINALYSIS, ROUTINE W REFLEX MICROSCOPIC (NOT AT [facility]) - Abnormal; Notable for the following:    Ketones, ur 40 (*)    All other components within normal limits  HEPATIC FUNCTION PANEL - Abnormal; Notable for the following:    AST 57 (*)    ALT 65 (*)    Bilirubin, Direct <0.1 (*)    All other components within normal limits  LIPASE, BLOOD - Abnormal; Notable for the following:    Lipase 16 (*)    All other components within normal limits  URINE CULTURE  CBC  D-DIMER, QUANTITATIVE (NOT AT [facility])  PREGNANCY, URINE  I-STAT TROPOININ, ED  Randolm Idol, ED    Imaging Review Dg Chest 2 View  05/18/2015   CLINICAL DATA:  Chest pain for 2 days.  EXAM: CHEST  2 VIEW  COMPARISON:  02/05/2015  FINDINGS: Cardiomediastinal silhouette is normal. Mediastinal contours appear intact. Rounded lobulated soft tissue densities project over the expected location of the bilateral hilar regions, seen on the lateral view only.  There is no evidence of focal airspace consolidation, pleural effusion or pneumothorax.  Osseous structures are without acute abnormality. Soft tissues are grossly normal.  IMPRESSION: Lobulated soft tissue densities project over the expected location of bilateral hilar regions, seen on the lateral view only. This may be due to overlapping shadows, however it may represent hilar lymphadenopathy. If hilar lymphadenopathy is suggested, CT of the chest may be considered for further evaluation.   Electronically Signed   By: Fidela Salisbury M.D.   On: 05/18/2015  15:23   Ct Chest W Contrast  05/18/2015   CLINICAL DATA:  Acute onset of generalized chest and abdominal pain. Nausea and vomiting. Initial encounter.  EXAM: CT CHEST, ABDOMEN, AND PELVIS WITH CONTRAST  TECHNIQUE: Multidetector CT imaging of the chest, abdomen and pelvis was performed following the standard protocol during bolus administration of intravenous contrast.  CONTRAST:  130mL OMNIPAQUE IOHEXOL 300 MG/ML  SOLN  COMPARISON:  Pelvic ultrasound performed 05/14/2015, and CT of the abdomen and pelvis from 10/30/2014. CTA of the chest performed 10/24/2014  FINDINGS: CT CHEST FINDINGS  The lungs appear essentially clear bilaterally. No focal consolidation, pleural effusion or pneumothorax is seen. No masses are identified.  The mediastinum is unremarkable in appearance. No mediastinal lymphadenopathy is seen. No pericardial effusion is identified. The great vessels are grossly unremarkable in appearance. The thyroid gland is unremarkable. No axillary lymphadenopathy is appreciated.  No acute osseous abnormalities are identified.  CT ABDOMEN AND PELVIS FINDINGS  The liver and spleen are unremarkable in appearance. The patient is status post cholecystectomy, with clips noted along the gallbladder fossa.  The pancreas and adrenal glands are unremarkable.  The kidneys are unremarkable in appearance. There is no evidence of hydronephrosis. No renal or ureteral stones are seen. No perinephric stranding is appreciated.  No free fluid is identified. The small bowel is unremarkable in appearance. The stomach is within normal limits. No acute vascular abnormalities are seen.  The appendix is normal in caliber, without evidence of appendicitis. Mild diverticulosis is noted along the descending and proximal sigmoid colon, without evidence of diverticulitis.  The bladder is mildly distended and grossly unremarkable. The uterus is within normal limits. The ovaries are grossly symmetric. No suspicious adnexal masses are seen.  The patient is status post bilateral tubal ligation. No inguinal lymphadenopathy is seen.  No acute osseous abnormalities are identified.  IMPRESSION: 1. No acute abnormality seen within the chest, abdomen or pelvis. 2. Mild diverticulosis along the descending and proximal sigmoid colon, without evidence of diverticulitis.   Electronically Signed   By: Garald Balding M.D.   On: 05/18/2015 22:12   Ct Abdomen Pelvis W Contrast  05/18/2015   CLINICAL DATA:  Acute onset of generalized chest and abdominal pain. Nausea and vomiting. Initial encounter.  EXAM: CT CHEST, ABDOMEN, AND PELVIS WITH CONTRAST  TECHNIQUE: Multidetector CT imaging of the chest, abdomen and pelvis was performed following the standard protocol during bolus administration of intravenous contrast.  CONTRAST:  121mL OMNIPAQUE IOHEXOL 300 MG/ML  SOLN  COMPARISON:  Pelvic ultrasound performed 05/14/2015, and CT of the abdomen and pelvis from 10/30/2014. CTA of the chest performed 10/24/2014  FINDINGS: CT CHEST FINDINGS  The lungs appear essentially clear bilaterally. No focal consolidation, pleural effusion or pneumothorax is seen. No masses are identified.  The mediastinum is unremarkable in appearance. No mediastinal lymphadenopathy is seen. No pericardial effusion is identified. The great vessels are grossly unremarkable in appearance. The thyroid gland is unremarkable. No axillary lymphadenopathy is appreciated.  No acute osseous abnormalities are identified.  CT ABDOMEN AND PELVIS FINDINGS  The liver and spleen are unremarkable in appearance. The patient is status post cholecystectomy, with clips noted along the gallbladder fossa. The pancreas and adrenal glands are unremarkable.  The kidneys are unremarkable in appearance. There is no evidence of hydronephrosis. No renal or ureteral stones are seen. No perinephric stranding is appreciated.  No free fluid is identified. The small bowel is unremarkable in appearance. The stomach is within normal  limits. No acute vascular abnormalities are seen.  The appendix is normal in caliber, without evidence of appendicitis. Mild diverticulosis is noted along the descending and proximal sigmoid colon, without evidence of diverticulitis.  The bladder is mildly distended and grossly unremarkable. The uterus is within normal limits. The ovaries are grossly symmetric. No suspicious adnexal masses are seen. The patient is status post bilateral tubal ligation. No inguinal lymphadenopathy is seen.  No acute osseous abnormalities are identified.  IMPRESSION: 1. No acute abnormality seen within the chest, abdomen or pelvis. 2. Mild diverticulosis along the descending and proximal sigmoid colon, without evidence of diverticulitis.   Electronically Signed   By: Garald Balding M.D.   On: 05/18/2015 22:12   I have personally reviewed and evaluated these images and lab results as part of my medical decision-making.   EKG Interpretation None      MDM   Final diagnoses:  Chest pain, unspecified chest pain type  Generalized abdominal pain     Patient is a 29 year old female with past medical history sent in for cholecystectomy, pyelonephritis requiring hospitalization, asthma that presents  with 2 weeks of right flank pain and 2 days of left chest pain. Patient was seen here 5 days ago for similar symptoms. At that time patient had a negative ultrasound with reassuring labs and a clean urine. Patient denies any dysuria fever or chills. On arrival to the ED today the patient is afebrile stable vital signs. Patient appears very comfortable. Patient states that her left chest pain that started 2 days ago was pressure in sensation and is pleuritic. Patient currently is in no respiratory distress. Patient denies any recent travel leg swelling history of cancer surgery. The patient is a well-developed low risk. On physical exam patient has no wheezing otherwise clear lung sounds and normal heart sounds. Patient has diffuse  abdominal tenderness worse in the right upper quadrant with bilateral CVA tenderness. We will check screening labs. Patient's EKG is normal sinus rhythm without evidence of ischemia and there is no S1Q3T3 or sinus tachycardia. Patient's chest x-ray significant for questionable hilar lymphadenopathy. Patient's CT chest abdomen pelvis were unremarkable with the exception of diverticulosis in the descending colon. Patient able to tolerate by mouth. Patient's urine was unremarkable pregnancy was negative. Given the patient's normal workup and improvement of symptoms and she'll patient is safe for discharge. Return precautions given. Patient given information to follow to establish care with PCP.   Renne Musca, MD 05/18/15 2325  Wandra Arthurs, MD 05/19/15 2117

## 2015-05-20 LAB — URINE CULTURE

## 2015-06-13 ENCOUNTER — Emergency Department (INDEPENDENT_AMBULATORY_CARE_PROVIDER_SITE_OTHER)
Admission: EM | Admit: 2015-06-13 | Discharge: 2015-06-13 | Disposition: A | Discharge status: Home / Self Care | Payer: Self-pay | Source: Home / Self Care | Attending: Family Medicine | Admitting: Family Medicine

## 2015-06-13 ENCOUNTER — Encounter (HOSPITAL_COMMUNITY): Payer: Self-pay | Admitting: Family Medicine

## 2015-06-13 DIAGNOSIS — F419 Anxiety disorder, unspecified: Secondary | ICD-10-CM

## 2015-06-13 DIAGNOSIS — R0789 Other chest pain: Secondary | ICD-10-CM

## 2015-06-13 MED ORDER — OMEPRAZOLE 40 MG PO CPDR: 40.0000 mg | DELAYED_RELEASE_CAPSULE | Freq: Every day | ORAL | Status: DC

## 2015-06-13 MED ORDER — HYDROXYZINE PAMOATE 100 MG PO CAPS: 100.0000 mg | ORAL_CAPSULE | Freq: Three times a day (TID) | ORAL | Status: DC | PRN

## 2015-06-13 MED ORDER — GI COCKTAIL ~~LOC~~
30.0000 mL | Freq: Once | ORAL | Status: AC
  Administered 2015-06-13: 30 mL via ORAL

## 2015-06-13 MED ORDER — GI COCKTAIL ~~LOC~~
ORAL | Status: AC
  Filled 2015-06-13: qty 30

## 2015-06-13 NOTE — ED Provider Notes (Signed)
CSN: [id number]     Arrival date & time 06/13/15  1307 History   First MD Initiated Contact with Patient 06/13/15 1414     Chief Complaint  Patient presents with  . Back Pain  . Chest Pain   (Consider location/radiation/quality/duration/timing/severity/associated sxs/prior Treatment) HPI  CP: CP on R side. Ongoing 2 weeks.  Pt states she feels it is due to her feeling anxious Comes and goes.  Non-positional 30 min episodes.  Nothing makes it better Worse when feels anxious and w/ some deep breathing Routine has not changed Feels agitation w/ CP.  No known family h/o heart disease Currently in CP and started when pt got to waiting room 1hr 78min ago    Now w/ cough   Past Medical History  Diagnosis Date  . Diverticulitis   . Pyelonephritis   . Pyelonephritis   . Asthma    Past Surgical History  Procedure Laterality Date  . Cholecystectomy    . Cesarean section    . Cesarean section N/A 2006  . Cesarean section N/A 09/03/2014    Procedure: CESAREAN SECTION;  Surgeon: Guss Bunde, MD;  Location: [facility];  Service: Obstetrics;  Laterality: N/A;  . Tubal ligation     Family History  Problem Relation Age of Onset  . Hyperlipidemia Mother   . Diabetes Maternal Uncle   . Diabetes Paternal Grandmother    Social History  Substance Use Topics  . Smoking status: Never Smoker   . Smokeless tobacco: Never Used  . Alcohol Use: No   OB History    This patient's OB History needs to be verified. Open OB History to review and resolve any issues.   Gravida Para Term Preterm AB TAB SAB Ectopic Multiple Living   5 3 1 2 2  2  1 2      Review of Systems Per HPI with all other pertinent systems negative.   Allergies  Review of patient's allergies indicates no known allergies.  Home Medications   Prior to Admission medications   Medication Sig Start Date End Date Taking? Authorizing Provider  albuterol (PROVENTIL HFA;VENTOLIN HFA) 108 (90 BASE) MCG/ACT inhaler Inhale  4 puffs into the lungs every 6 (six) hours as needed for wheezing or shortness of breath. 08/13/14  Yes Mariel Aloe, MD  hydrOXYzine (VISTARIL) 100 MG capsule Take 1 capsule (100 mg total) by mouth 3 (three) times daily as needed for anxiety. 06/13/15   Waldemar Dickens, MD  ibuprofen (ADVIL,MOTRIN) 600 MG tablet Take 1 tablet (600 mg total) by mouth every 8 (eight) hours as needed for moderate pain. 05/14/15   Malvin Johns, MD  omeprazole (PRILOSEC) 40 MG capsule Take 1 capsule (40 mg total) by mouth daily. 06/13/15   Waldemar Dickens, MD   Meds Ordered and Administered this Visit   Medications  gi cocktail (Maalox,Lidocaine,Donnatal) (30 mLs Oral Given 06/13/15 1455)    BP 115/76 mmHg  Pulse 69  Temp(Src) 98.1 F (36.7 C) (Oral)  Resp 16  SpO2 98%  LMP 04/13/2015 (LMP Unknown) No data found.   Physical Exam Physical Exam  Constitutional: oriented to person, place, and time. appears well-developed and well-nourished. No distress.  HENT:  Head: Normocephalic and atraumatic.  Eyes: EOMI. PERRL.  Neck: Normal range of motion.  Cardiovascular: RRR, no m/r/g, 2+ distal pulses,  Pulmonary/Chest: Effort normal and breath sounds normal. No respiratory distress.  Abdominal: Soft. Bowel sounds are normal. NonTTP, no distension.  Musculoskeletal: Chest pain reproducible on palpation of  sternal and right upper chest wall.  Neurological: alert and oriented to person, place, and time.  Skin: Skin is warm. No rash noted. non diaphoretic.  Psychiatric: normal mood and affect. behavior is normal. Judgment and thought content normal.   ED Course  Procedures (including critical care time)  Labs Review Labs Reviewed - No data to display  Imaging Review No results found.   Visual Acuity Review  Right Eye Distance:   Left Eye Distance:   Bilateral Distance:    Right Eye Near:   Left Eye Near:    Bilateral Near:         MDM   1. Chest pain, non-cardiac   2. Anxiety     EKG  nml Unlikely cardiac GI cocktail w/ some improvement Likely multifactorial including GI and MSK and axiety  prilosec and motrin and hydroxyzine   Waldemar Dickens, MD 06/13/15 1459

## 2015-06-13 NOTE — Discharge Instructions (Signed)
Your chest pain is not from your heart Please take the prilosec for excess acid production in your stomach Please use ibuprofen 600mg  for bone and muscle pain in your chest Please use the hydroxyzine for anxiety   Su dolor en el pecho no es de tu corazn Por favor tome el Prilosec para la produccin de Location manager de cido en el estmago Por favor, use 600 mg de ibuprofeno para Conservation officer, historic buildings seo y muscular en el pecho Utilice el hidroxizina para la ansiedad

## 2015-06-13 NOTE — ED Notes (Addendum)
Pt has been suffering from mid upper chest pain and right mid back pain for two weeks.  Pt also has a cough, but she states it is not related to the chest pain. Pt information was obtained using the interpreter phone, interpreter # [phone], with Dr. Marily Memos present for assessment.

## 2015-06-20 ENCOUNTER — Ambulatory Visit: Payer: Self-pay

## 2015-06-20 ENCOUNTER — Ambulatory Visit: Payer: Self-pay | Attending: Internal Medicine

## 2015-07-06 ENCOUNTER — Emergency Department (HOSPITAL_COMMUNITY)
Admission: EM | Admit: 2015-07-06 | Discharge: 2015-07-06 | Disposition: A | Discharge status: Home / Self Care | Payer: Self-pay | Attending: Emergency Medicine | Admitting: Emergency Medicine

## 2015-07-06 ENCOUNTER — Encounter (HOSPITAL_COMMUNITY): Payer: Self-pay | Admitting: *Deleted

## 2015-07-06 DIAGNOSIS — J45909 Unspecified asthma, uncomplicated: Secondary | ICD-10-CM | POA: Insufficient documentation

## 2015-07-06 DIAGNOSIS — B9689 Other specified bacterial agents as the cause of diseases classified elsewhere: Secondary | ICD-10-CM

## 2015-07-06 DIAGNOSIS — N760 Acute vaginitis: Secondary | ICD-10-CM | POA: Insufficient documentation

## 2015-07-06 DIAGNOSIS — Z8719 Personal history of other diseases of the digestive system: Secondary | ICD-10-CM | POA: Insufficient documentation

## 2015-07-06 DIAGNOSIS — N12 Tubulo-interstitial nephritis, not specified as acute or chronic: Secondary | ICD-10-CM | POA: Insufficient documentation

## 2015-07-06 DIAGNOSIS — Z79899 Other long term (current) drug therapy: Secondary | ICD-10-CM | POA: Insufficient documentation

## 2015-07-06 DIAGNOSIS — Z3202 Encounter for pregnancy test, result negative: Secondary | ICD-10-CM | POA: Insufficient documentation

## 2015-07-06 LAB — WET PREP, GENITAL
Trich, Wet Prep: NONE SEEN
Yeast Wet Prep HPF POC: NONE SEEN

## 2015-07-06 LAB — CBC WITH DIFFERENTIAL/PLATELET
BASOS ABS: 0 10*3/uL (ref 0.0–0.1)
Basophils Relative: 1 %
Eosinophils Absolute: 0.1 10*3/uL (ref 0.0–0.7)
Eosinophils Relative: 1 %
HEMATOCRIT: 38.4 % (ref 36.0–46.0)
Hemoglobin: 12.4 g/dL (ref 12.0–15.0)
LYMPHS PCT: 39 %
Lymphs Abs: 3.2 10*3/uL (ref 0.7–4.0)
MCH: 28.9 pg (ref 26.0–34.0)
MCHC: 32.3 g/dL (ref 30.0–36.0)
MCV: 89.5 fL (ref 78.0–100.0)
MONO ABS: 0.4 10*3/uL (ref 0.1–1.0)
Monocytes Relative: 5 %
NEUTROS ABS: 4.5 10*3/uL (ref 1.7–7.7)
Neutrophils Relative %: 54 %
Platelets: 280 10*3/uL (ref 150–400)
RBC: 4.29 MIL/uL (ref 3.87–5.11)
RDW: 13.1 % (ref 11.5–15.5)
WBC: 8.2 10*3/uL (ref 4.0–10.5)

## 2015-07-06 LAB — URINALYSIS, ROUTINE W REFLEX MICROSCOPIC
Bilirubin Urine: NEGATIVE
GLUCOSE, UA: NEGATIVE mg/dL
Ketones, ur: NEGATIVE mg/dL
Nitrite: POSITIVE — AB
PH: 6 (ref 5.0–8.0)
PROTEIN: 100 mg/dL — AB
Specific Gravity, Urine: 1.017 (ref 1.005–1.030)
Urobilinogen, UA: 0.2 mg/dL (ref 0.0–1.0)

## 2015-07-06 LAB — URINE MICROSCOPIC-ADD ON

## 2015-07-06 LAB — BASIC METABOLIC PANEL
ANION GAP: 9 (ref 5–15)
BUN: 11 mg/dL (ref 6–20)
CALCIUM: 9.1 mg/dL (ref 8.9–10.3)
CHLORIDE: 104 mmol/L (ref 101–111)
CO2: 27 mmol/L (ref 22–32)
Creatinine, Ser: 0.79 mg/dL (ref 0.44–1.00)
GFR calc Af Amer: >60 mL/min (ref >60)
GFR calc non Af Amer: >60 mL/min (ref >60)
GLUCOSE: 108 mg/dL — AB (ref 65–99)
POTASSIUM: 3.4 mmol/L — AB (ref 3.5–5.1)
Sodium: 140 mmol/L (ref 135–145)

## 2015-07-06 LAB — POC URINE PREG, ED: Preg Test, Ur: NEGATIVE

## 2015-07-06 MED ORDER — DEXTROSE 5 % IV SOLN
1.0000 g | Freq: Once | INTRAVENOUS | Status: AC
  Administered 2015-07-06: 1 g via INTRAVENOUS
  Filled 2015-07-06: qty 10

## 2015-07-06 MED ORDER — METRONIDAZOLE 500 MG PO TABS: 500.0000 mg | ORAL_TABLET | Freq: Two times a day (BID) | ORAL | Status: DC

## 2015-07-06 MED ORDER — KETOROLAC TROMETHAMINE 30 MG/ML IJ SOLN
30.0000 mg | Freq: Once | INTRAMUSCULAR | Status: AC
  Administered 2015-07-06: 30 mg via INTRAVENOUS
  Filled 2015-07-06: qty 1

## 2015-07-06 MED ORDER — METHOCARBAMOL 500 MG PO TABS
1000.0000 mg | ORAL_TABLET | Freq: Once | ORAL | Status: AC
  Administered 2015-07-06: 1000 mg via ORAL
  Filled 2015-07-06: qty 2

## 2015-07-06 MED ORDER — SODIUM CHLORIDE 0.9 % IV BOLUS (SEPSIS)
1000.0000 mL | Freq: Once | INTRAVENOUS | Status: AC
  Administered 2015-07-06: 1000 mL via INTRAVENOUS

## 2015-07-06 MED ORDER — METRONIDAZOLE 500 MG PO TABS
500.0000 mg | ORAL_TABLET | Freq: Once | ORAL | Status: AC
  Administered 2015-07-06: 500 mg via ORAL
  Filled 2015-07-06: qty 1

## 2015-07-06 MED ORDER — CEPHALEXIN 500 MG PO CAPS: 500.0000 mg | ORAL_CAPSULE | Freq: Two times a day (BID) | ORAL | Status: DC

## 2015-07-06 MED ORDER — ONDANSETRON HCL 4 MG/2ML IJ SOLN
4.0000 mg | Freq: Once | INTRAMUSCULAR | Status: AC
  Administered 2015-07-06: 4 mg via INTRAVENOUS
  Filled 2015-07-06: qty 2

## 2015-07-06 MED ORDER — METHOCARBAMOL 500 MG PO TABS: 500.0000 mg | ORAL_TABLET | Freq: Four times a day (QID) | ORAL | Status: DC | PRN

## 2015-07-06 NOTE — ED Notes (Signed)
Pt arrives from home and has c/o bilateral flank pain, frequent urination with burning and has a white thick d/c.

## 2015-07-06 NOTE — ED Notes (Signed)
Pt is in stable condition upon d/c and ambulates from ED. 

## 2015-07-06 NOTE — ED Provider Notes (Signed)
  Face-to-face evaluation   History: Patient complains of intermittent right flank pain since yesterday, similar to prior episodes of urinary tract infection. She also has a vaginal discharge, white in color.  Physical exam: Alert, calm, cooperative. Nontoxic appearance.  Medical screening examination/treatment/procedure(s) were conducted as a shared visit with non-physician practitioner(s) and myself.  I personally evaluated the patient during the encounter  Daleen Bo, MD 07/07/15 2354

## 2015-07-06 NOTE — Discharge Instructions (Signed)
Para el control del dolor puede tomar hasta 800 mg de Motrin (tambin conocido como el ibuprofeno). Eso es por lo general [place], 3 veces al [employer]. Tomar con alimentos para Ship broker irritacin del estmago  Tambin puede tomar Tylenol 975mg  (acetaminofeno) (esto es 3 sobre las pldoras de venta libre) cuatro veces al Training and development officer. No beba alcohol o combinado con otros medicamentos que contienen acetaminofeno como ingrediente (Lea las etiquetas!).  Para el dolor irruptivo puede tomar Robaxin. No beba alcohol, conducir o manejar maquinaria pesada al tomar Robaxin.  Tome sus antibiticos como se indica y Nurse, children's su finalizacin. Nunca se debera tener ningn antibiticos sobrantes! Introducir lquidos y [employer] hidratado.  Cualquier uso de antibiticos puede reducir la eficacia de los anticonceptivos hormonales. Por favor, utilice una copia de seguridad mtodo anticonceptivo.  No dude en volver a la sala de emergencias de cualquier nuevo empeoramiento ni respecto de los sntomas.  Por favor, obtener la atencin primaria utilizando la gua de recursos a continuacin. Que sepan que te vieron en la sala de emergencia y Deno Etienne a necesitar para obtener los registros para su posterior manejo ambulatorio.  For pain control you may take up to 800mg  of Motrin (also known as ibuprofen). That is usually 4 over the counter pills,  3 times a day. Take with food to minimize stomach irritation   You can also take  tylenol (acetaminophen) 975mg  (this is 3 over the counter pills) four times a day. Do not drink alcohol or combine with other medications that have acetaminophen as an ingredient (Read the labels!).    For breakthrough pain you may take Robaxin. Do not drink alcohol, drive or operate heavy machinery when taking Robaxin.  Take your antibiotics as directed and to completion. You should never have any leftover antibiotics! Push fluids and stay well hydrated.   Any antibiotic  use can reduce the efficacy of hormonal birth control. Please use back up method of contraception.   Do not hesitate to return to the emergency room for any new, worsening or concerning symptoms.  Please obtain primary care using resource guide below. Let them know that you were seen in the emergency room and that they will need to obtain records for further outpatient management.    Vaginosis bacteriana (Bacterial Vaginosis) La vaginosis bacteriana es una infeccin vaginal que perturba el equilibrio normal de las bacterias que se encuentran en la vagina. Es el resultado de un crecimiento excesivo de ciertas bacterias. Esta es la infeccin vaginal ms frecuente en mujeres en edad reproductiva. El tratamiento es importante para prevenir complicaciones, especialmente en mujeres embarazadas, dado que puede causar un parto prematuro. CAUSAS  La vaginosis bacteriana se origina por un aumento de bacterias nocivas que, generalmente, estn presentes en cantidades ms pequeas en la vagina. Varios tipos diferentes de bacterias pueden causar esta afeccin. Sin embargo, la causa de su desarrollo no se comprende totalmente. [place] o comportamientos pueden exponerlo a un mayor riesgo de desarrollar vaginosis bacteriana, entre los que se incluyen:  Tener una nueva pareja sexual o mltiples parejas sexuales.  Las duchas vaginales  El uso del DIU (dispositivo intrauterino) como mtodo anticonceptivo. El contagio no se produce en baos, por ropas de cama, en piscinas o por contacto con objetos. SIGNOS Y SNTOMAS  Algunas mujeres que padecen vaginosis bacteriana no presentan signos ni sntomas. Los sntomas ms comunes son:  Secrecin vaginal de color grisceo.  Secrecin vaginal con olor similar al [employer],  especialmente despus de Retail banker.  Picazn o sensacin de ardor en la vagina o la vulva.  Ardor o dolor al [employer]. DIAGNSTICO  Su mdico  analizar su historia clnica y le examinar la vagina para detectar signos de vaginosis bacteriana. Puede tomarle Truddie Coco de flujo vaginal. Su mdico examinar esta muestra con un microscopio para controlar las bacterias y clulas anormales. Tambin puede realizarse un anlisis del pH vaginal.  TRATAMIENTO  La vaginosis bacteriana puede tratarse con antibiticos, en forma de comprimidos o de crema vaginal. Puede indicarse una segunda tanda de antibiticos si la afeccin se repite despus del tratamiento. Debido a que la vaginosis bacteriana aumenta el riesgo de contraer enfermedades de transmisin sexual, el tratamiento puede ayudar a reducir el riesgo de clamidia, [place], VIH y herpes. [place] solo medicamentos de venta libre o recetados, segn las indicaciones del mdico.  Si le han recetado antibiticos, tmelos como se le indic. Asegrese de que finaliza la prescripcin completa aunque se sienta mejor.  Comunique a sus compaeros sexuales que sufre una infeccin vaginal. Deben consultar a su mdico y recibir tratamiento si tienen problemas, como picazn o una erupcin cutnea leve.  Durante el [place], es importante que siga estas indicaciones:  Visual merchandiser relaciones sexuales o use preservativos de la forma correcta.  No se haga duchas vaginales.  Evite consumir alcohol como se lo haya indicado el mdico.  Community education officer se lo haya indicado el mdico. SOLICITE ATENCIN MDICA SI:   Sus sntomas no mejoran despus de 3 das de [place].  Aumenta la secrecin o Conservation officer, historic buildings.  Tiene fiebre. ASEGRESE DE QUE:      Pielonefritis en los adultos (Pyelonephritis, Adult) La pielonefritis es una infeccin del rin. Los riones son los rganos que filtran la sangre y [employer] residuos del torrente sanguneo a travs de la orina. La orina pasa desde los riones, a travs de los urteres, Water engineer la vejiga. Hay dos tipos  principales de pielonefritis: Infecciones que se inician rpidamente sin sntomas previos (pielonefritis aguda). Infecciones que persisten durante un perodo prolongado (pielonefritis crnica). En la [employer], la infeccin desaparece con el tratamiento y no causa otros problemas. Las infecciones ms graves o crnicas a veces pueden propagarse al torrente sanguneo u ocasionar otros problemas en los riones. CAUSAS Por lo general, entre las causas de esta afeccin, se incluyen las siguientes: Bacterias que pasan desde la vejiga al rin a travs de la orina infectada. La orina de la vejiga puede infectarse por bacterias relacionadas con estas causas: Infeccin en la vejiga (cistitis). Inflamacin de la prstata (prostatitis). Relaciones sexuales en las mujeres. Bacterias que pasan del torrente sanguneo al rin. FACTORES DE RIESGO Es ms probable que esta afeccin se manifieste en: Las embarazadas. Las personas de edad avanzada. Los diabticos. Las personas que tienen clculos en los riones o la vejiga. Las personas que tienen otras anomalas en el rin o los urteres. Las personas que tienen una sonda vesical. Las personas con Hotel manager. Las personas que son sexualmente activas. Las mujeres que usan espermicidas. Las personas que han tenido una infeccin previa en las vas [place]. SNTOMAS Los sntomas de esta afeccin incluyen lo siguiente: Ganas frecuentes de Garment/textile technologist. Necesidad intensa o persistente de Garment/textile technologist. Sensacin de ardor o escozor al [employer]. Dolor abdominal. Dolor de espalda. Dolor al costado del cuerpo o en la fosa lumbar. Cristy Hilts. Escalofros. Sangre en la [place] u [place]. Nuseas. Vmitos. DIAGNSTICO Esta afeccin  se puede diagnosticar en funcin de lo siguiente: Examen fsico e historia clnica. Anlisis de [place]. Anlisis de [place]. Tambin pueden [employer] de diagnstico por imgenes de los riones, por [place], una ecografa o una  tomografa computarizada. TRATAMIENTO El tratamiento de esta afeccin puede depender de la gravedad de la infeccin. Si la infeccin es leve y se detecta rpidamente, pueden administrarle antibiticos por va oral. Deber tomar lquido para permanecer hidratado. Si la infeccin es ms grave, es posible que deban hospitalizarlo para administrarle antibiticos directamente en una vena a travs de una va intravenosa (IV). Quizs tambin deban administrarle lquidos a travs de una va intravenosa si no se encuentra bien hidratado. Despus de la hospitalizacin, es posible que deba tomar antibiticos durante un [place]. Podrn prescribirle otros tratamientos segn la causa de la infeccin. [employer] los medicamentos de venta libre y los recetados solamente como se lo haya indicado el mdico. Si le recetaron un antibitico, tmelo como se lo haya indicado el mdico. No deje de tomar los antibiticos aunque comience a Sports administrator. Instrucciones generales Beba suficiente lquido para mantener la orina clara o de color amarillo plido. Evite la cafena, el t y las bebidas gaseosas. Estas sustancias irritan la vejiga. Orine con frecuencia. Evite retener la orina durante largos perodos. Orine antes y despus [place]. Despus de defecar, las mujeres deben higienizarse la regin perineal desde adelante hacia atrs. Use cada trozo de papel higinico solo una vez. Concurra a todas las visitas de control como se lo haya indicado el mdico. Esto es importante. SOLICITE ATENCIN MDICA SI: Los sntomas no mejoran despus de 2das de tratamiento. Los sntomas empeoran. Tiene fiebre. SOLICITE ATENCIN MDICA DE INMEDIATO SI: No puede tomar los antibiticos ni ingerir lquidos. Comienza a sentir escalofros. Vomita. Siente un dolor intenso en la espalda o en la fosa lumbar. Se desmaya o siente una debilidad extrema.   Esta informacin  no tiene Marine scientist el consejo del mdico. Asegrese de hacerle al mdico cualquier pregunta que tenga.   Document Released: 06/13/2005 Document Revised: 05/25/2015 Elsevier Interactive Patient Education 2016 [place] instrucciones.  Controlar su afeccin.  Recibir ayuda de inmediato si no mejora o si empeora. PARA OBTENER MS INFORMACIN  Centros para el control y la prevencin de Probation officer for Disease Control and Prevention, CDC): AppraiserFraud.fi Asociacin Estadounidense de la Salud Sexual (American Sexual Health Association, SHA): www.ashastd.org    Esta informacin no tiene Marine scientist el consejo del mdico. Asegrese de hacerle al mdico cualquier pregunta que tenga.   Document Released: 12/11/2007 Document Revised: 09/24/2014 Elsevier Interactive Patient Education [employer].

## 2015-07-06 NOTE — ED Provider Notes (Signed)
CSN: [id number]     Arrival date & time 07/06/15  0354 History   First MD Initiated Contact with Patient 07/06/15 0957     Chief Complaint  Patient presents with  . Back Pain     (Consider location/radiation/quality/duration/timing/severity/associated sxs/prior Treatment) The history is limited by a language barrier. A language interpreter was used.    Blood pressure 118/72, pulse 84, temperature 98.1 F (36.7 C), temperature source Oral, resp. rate 16, height 5' (1.524 m), weight 192 lb (87.091 kg), last menstrual period 04/13/2015, SpO2 100 %, currently breastfeeding.  Lorraine [name] is a 29 y.o. female with PMH of diverticulitis, pyelonephritis, and asthma presents with back pain and burning with urination. The patient states that the pain and dysuria started one week ago. The pain is a constant cramping pain with associated pressure. Denies alleviating or aggravating factors. She reports some radiation of pain to the right lower abdomen. Reports associated nausea, vomiting, frequency, subjective fever, and chills. Denies chest pain, SOB, cough, diarrhea, or constipation. She also reports some white vaginal discharge and vaginal itching that has been present for about 3 weeks now.    Past Medical History  Diagnosis Date  . Diverticulitis   . Pyelonephritis   . Pyelonephritis   . Asthma    Past Surgical History  Procedure Laterality Date  . Cholecystectomy    . Cesarean section    . Cesarean section N/A 2006  . Cesarean section N/A 09/03/2014    Procedure: CESAREAN SECTION;  Surgeon: Guss Bunde, MD;  Location: [facility];  Service: Obstetrics;  Laterality: N/A;  . Tubal ligation     Family History  Problem Relation Age of Onset  . Hyperlipidemia Mother   . Diabetes Maternal Uncle   . Diabetes Paternal Grandmother    Social History  Substance Use Topics  . Smoking status: Never Smoker   . Smokeless tobacco: Never Used  . Alcohol Use: No   OB History    This  patient's OB History needs to be verified. Open OB History to review and resolve any issues.   Gravida Para Term Preterm AB TAB SAB Ectopic Multiple Living   5 3 1 2 2  2  1 2      Review of Systems  10 systems reviewed and found to be negative, except as noted in the HPI.   Allergies  Review of patient's allergies indicates no known allergies.  Home Medications   Prior to Admission medications   Medication Sig Start Date End Date Taking? Authorizing Provider  albuterol (PROVENTIL HFA;VENTOLIN HFA) 108 (90 BASE) MCG/ACT inhaler Inhale 4 puffs into the lungs every 6 (six) hours as needed for wheezing or shortness of breath. 08/13/14  Yes Mariel Aloe, MD  hydrOXYzine (VISTARIL) 100 MG capsule Take 1 capsule (100 mg total) by mouth 3 (three) times daily as needed for anxiety. 06/13/15  Yes Waldemar Dickens, MD  ibuprofen (ADVIL,MOTRIN) 600 MG tablet Take 1 tablet (600 mg total) by mouth every 8 (eight) hours as needed for moderate pain. 05/14/15  Yes Malvin Johns, MD  omeprazole (PRILOSEC) 40 MG capsule Take 1 capsule (40 mg total) by mouth daily. 06/13/15  Yes Waldemar Dickens, MD   BP 118/72 mmHg  Pulse 84  Temp(Src) 98.1 F (36.7 C) (Oral)  Resp 16  Ht 5' (1.524 m)  Wt 192 lb (87.091 kg)  BMI 37.50 kg/m2  SpO2 100%  LMP 04/13/2015 (LMP Unknown) Physical Exam  Constitutional: She is oriented to person, place,  and time. She appears well-developed and well-nourished. No distress.  HENT:  Head: Normocephalic and atraumatic.  Mouth/Throat: Oropharynx is clear and moist.  Eyes: Conjunctivae and EOM are normal. Pupils are equal, round, and reactive to light.  Neck: Normal range of motion.  Cardiovascular: Normal rate, regular rhythm and intact distal pulses.   Pulmonary/Chest: Effort normal and breath sounds normal.  Abdominal: Soft. There is no tenderness.  Mild, diffuse tenderness to palpation with no guarding or rebound.  Murphy sign negative, no tenderness to palpation over  McBurney's point, Rovsings, Psoas and obturator all negative.   Genitourinary:  Mild right-sided CVA tenderness to percussion.  Pelvic exam is chaperoned by [place]: No rashes or lesions, there is a scant amount of dark blood from the cervical os. No cervical motion or adnexal tenderness.  Musculoskeletal: Normal range of motion.  Neurological: She is alert and oriented to person, place, and time.  Skin: She is not diaphoretic.  Psychiatric: She has a normal mood and affect.  Nursing note and vitals reviewed.   ED Course  Procedures (including critical care time) Labs Review Labs Reviewed  WET PREP, GENITAL  URINALYSIS, ROUTINE W REFLEX MICROSCOPIC (NOT AT [facility])  CBC WITH DIFFERENTIAL/PLATELET  BASIC METABOLIC PANEL  RPR  HIV ANTIBODY (ROUTINE TESTING)  POC URINE PREG, ED  GC/CHLAMYDIA PROBE AMP ([place]) NOT AT [facility]    Imaging Review No results found. I have personally reviewed and evaluated these images and lab results as part of my medical decision-making.   EKG Interpretation None      MDM   Final diagnoses:  None    Filed Vitals:   07/06/15 1111 07/06/15 1130 07/06/15 1200 07/06/15 1230  BP: 117/87 121/75 122/58 106/66  Pulse: 78 76 74 73  Temp:      TempSrc:      Resp: 14     Height:      Weight:      SpO2: 100% 100% 99% 100%    Medications  methocarbamol (ROBAXIN) tablet 1,000 mg (not administered)  sodium chloride 0.9 % bolus 1,000 mL (1,000 mLs Intravenous New Bag/Given 07/06/15 1128)  ketorolac (TORADOL) 30 MG/ML injection 30 mg (30 mg Intravenous Given 07/06/15 1128)  ondansetron (ZOFRAN) injection 4 mg (4 mg Intravenous Given 07/06/15 1128)  cefTRIAXone (ROCEPHIN) 1 g in dextrose 5 % 50 mL IVPB (0 g Intravenous Stopped 07/06/15 1158)  metroNIDAZOLE (FLAGYL) tablet 500 mg (500 mg Oral Given 07/06/15 1222)    Lorraine [name] is 29 y.o. female presenting with dysuria, right-sided flank pain and CVA tenderness to  percussion also low back pain and vaginal discharge. Abdominal exam is benign. Patient afebrile, normal vital signs. Pelvic exam with no significant abnormality. Urinalysis is consistent with infection, treated as a pyelonephritis. Wet prep shows many clue cells, this is likely the source of her vaginal discharge. Patient is started on Flagyl, Keflex. Given Robaxin for pain control. Patient is not breast-feeding, we talked about the importance of not taking these antibiotics will breast-feeding patient verbalizes her understanding.  Evaluation does not show pathology that would require ongoing emergent intervention or inpatient treatment. Pt is hemodynamically stable and mentating appropriately. Discussed findings and plan with patient/guardian, who agrees with care plan. All questions answered. Return precautions discussed and outpatient follow up given.   New Prescriptions   CEPHALEXIN (KEFLEX) 500 MG CAPSULE    Take 1 capsule (500 mg total) by mouth 2 (two) times daily.   METHOCARBAMOL (ROBAXIN) 500 MG TABLET  Take 1 tablet (500 mg total) by mouth every 6 (six) hours as needed for muscle spasms.   METRONIDAZOLE (FLAGYL) 500 MG TABLET    Take 1 tablet (500 mg total) by mouth 2 (two) times daily.         Monico Blitz, PA-C 07/06/15 [place], MD 07/07/15 [phone]

## 2015-07-07 ENCOUNTER — Inpatient Hospital Stay (HOSPITAL_COMMUNITY)
Admission: EM | Admit: 2015-07-07 | Discharge: 2015-07-11 | DRG: 690 | Disposition: A | Discharge status: Home / Self Care | Payer: Self-pay | Attending: Internal Medicine | Admitting: Internal Medicine

## 2015-07-07 ENCOUNTER — Encounter (HOSPITAL_COMMUNITY): Payer: Self-pay | Admitting: Emergency Medicine

## 2015-07-07 DIAGNOSIS — Z8349 Family history of other endocrine, nutritional and metabolic diseases: Secondary | ICD-10-CM

## 2015-07-07 DIAGNOSIS — B9789 Other viral agents as the cause of diseases classified elsewhere: Secondary | ICD-10-CM | POA: Diagnosis present

## 2015-07-07 DIAGNOSIS — Z833 Family history of diabetes mellitus: Secondary | ICD-10-CM

## 2015-07-07 DIAGNOSIS — J45909 Unspecified asthma, uncomplicated: Secondary | ICD-10-CM | POA: Diagnosis present

## 2015-07-07 DIAGNOSIS — N760 Acute vaginitis: Secondary | ICD-10-CM | POA: Diagnosis present

## 2015-07-07 DIAGNOSIS — R197 Diarrhea, unspecified: Secondary | ICD-10-CM | POA: Insufficient documentation

## 2015-07-07 DIAGNOSIS — R109 Unspecified abdominal pain: Secondary | ICD-10-CM

## 2015-07-07 DIAGNOSIS — K760 Fatty (change of) liver, not elsewhere classified: Secondary | ICD-10-CM | POA: Diagnosis present

## 2015-07-07 DIAGNOSIS — N12 Tubulo-interstitial nephritis, not specified as acute or chronic: Principal | ICD-10-CM | POA: Diagnosis present

## 2015-07-07 DIAGNOSIS — R112 Nausea with vomiting, unspecified: Secondary | ICD-10-CM | POA: Diagnosis present

## 2015-07-07 DIAGNOSIS — E876 Hypokalemia: Secondary | ICD-10-CM | POA: Diagnosis present

## 2015-07-07 DIAGNOSIS — B9689 Other specified bacterial agents as the cause of diseases classified elsewhere: Secondary | ICD-10-CM | POA: Insufficient documentation

## 2015-07-07 LAB — CBC WITH DIFFERENTIAL/PLATELET
BASOS ABS: 0 10*3/uL (ref 0.0–0.1)
BASOS PCT: 0 %
EOS PCT: 0 %
Eosinophils Absolute: 0 10*3/uL (ref 0.0–0.7)
HCT: 37.3 % (ref 36.0–46.0)
Hemoglobin: 12.3 g/dL (ref 12.0–15.0)
Lymphocytes Relative: 16 %
Lymphs Abs: 1.7 10*3/uL (ref 0.7–4.0)
MCH: 28.9 pg (ref 26.0–34.0)
MCHC: 33 g/dL (ref 30.0–36.0)
MCV: 87.6 fL (ref 78.0–100.0)
MONO ABS: 0.4 10*3/uL (ref 0.1–1.0)
Monocytes Relative: 4 %
Neutro Abs: 8.7 10*3/uL — ABNORMAL HIGH (ref 1.7–7.7)
Neutrophils Relative %: 80 %
PLATELETS: 264 10*3/uL (ref 150–400)
RBC: 4.26 MIL/uL (ref 3.87–5.11)
RDW: 12.8 % (ref 11.5–15.5)
WBC: 10.9 10*3/uL — ABNORMAL HIGH (ref 4.0–10.5)

## 2015-07-07 LAB — I-STAT BETA HCG BLOOD, ED (MC, WL, AP ONLY): I-stat hCG, quantitative: <5 m[IU]/mL (ref <5)

## 2015-07-07 LAB — URINALYSIS, ROUTINE W REFLEX MICROSCOPIC
BILIRUBIN URINE: NEGATIVE
Glucose, UA: NEGATIVE mg/dL
Hgb urine dipstick: NEGATIVE
KETONES UR: NEGATIVE mg/dL
NITRITE: NEGATIVE
PH: 6 (ref 5.0–8.0)
Protein, ur: NEGATIVE mg/dL
Specific Gravity, Urine: 1.02 (ref 1.005–1.030)
UROBILINOGEN UA: 0.2 mg/dL (ref 0.0–1.0)

## 2015-07-07 LAB — HIV ANTIBODY (ROUTINE TESTING W REFLEX): HIV Screen 4th Generation wRfx: NONREACTIVE

## 2015-07-07 LAB — URINE MICROSCOPIC-ADD ON

## 2015-07-07 LAB — COMPREHENSIVE METABOLIC PANEL
ALBUMIN: 4 g/dL (ref 3.5–5.0)
ALK PHOS: 114 U/L (ref 38–126)
ALT: 41 U/L (ref 14–54)
AST: 31 U/L (ref 15–41)
Anion gap: 10 (ref 5–15)
BILIRUBIN TOTAL: 0.4 mg/dL (ref 0.3–1.2)
BUN: 8 mg/dL (ref 6–20)
CALCIUM: 9.3 mg/dL (ref 8.9–10.3)
CO2: 23 mmol/L (ref 22–32)
Chloride: 103 mmol/L (ref 101–111)
Creatinine, Ser: 0.9 mg/dL (ref 0.44–1.00)
GFR calc Af Amer: >60 mL/min (ref >60)
GFR calc non Af Amer: >60 mL/min (ref >60)
GLUCOSE: 110 mg/dL — AB (ref 65–99)
Potassium: 3.1 mmol/L — ABNORMAL LOW (ref 3.5–5.1)
Sodium: 136 mmol/L (ref 135–145)
Total Protein: 7.4 g/dL (ref 6.5–8.1)

## 2015-07-07 LAB — GC/CHLAMYDIA PROBE AMP (~~LOC~~) NOT AT ARMC
CHLAMYDIA, DNA PROBE: NEGATIVE
NEISSERIA GONORRHEA: NEGATIVE

## 2015-07-07 LAB — RPR: RPR: NONREACTIVE

## 2015-07-07 LAB — I-STAT CG4 LACTIC ACID, ED: Lactic Acid, Venous: 1.87 mmol/L (ref 0.5–2.0)

## 2015-07-07 MED ORDER — ONDANSETRON 4 MG PO TBDP
ORAL_TABLET | ORAL | Status: AC
  Filled 2015-07-07: qty 2

## 2015-07-07 MED ORDER — ONDANSETRON 4 MG PO TBDP
8.0000 mg | ORAL_TABLET | Freq: Once | ORAL | Status: AC
  Administered 2015-07-07: 8 mg via ORAL

## 2015-07-07 MED ORDER — IBUPROFEN 200 MG PO TABS
ORAL_TABLET | ORAL | Status: AC
  Filled 2015-07-07: qty 3

## 2015-07-07 MED ORDER — ENOXAPARIN SODIUM 40 MG/0.4ML ~~LOC~~ SOLN
40.0000 mg | SUBCUTANEOUS | Status: DC
  Administered 2015-07-08 – 2015-07-11 (×4): 40 mg via SUBCUTANEOUS
  Filled 2015-07-07 (×4): qty 0.4

## 2015-07-07 MED ORDER — DEXTROSE 5 % IV SOLN
1.0000 g | Freq: Once | INTRAVENOUS | Status: AC
  Administered 2015-07-07: 1 g via INTRAVENOUS
  Filled 2015-07-07: qty 10

## 2015-07-07 MED ORDER — ALBUTEROL SULFATE HFA 108 (90 BASE) MCG/ACT IN AERS
1.0000 | INHALATION_SPRAY | Freq: Four times a day (QID) | RESPIRATORY_TRACT | Status: DC | PRN
  Filled 2015-07-07: qty 6.7

## 2015-07-07 MED ORDER — ONDANSETRON HCL 4 MG/2ML IJ SOLN
4.0000 mg | Freq: Once | INTRAMUSCULAR | Status: AC
  Administered 2015-07-07: 4 mg via INTRAVENOUS
  Filled 2015-07-07: qty 2

## 2015-07-07 MED ORDER — HYDROMORPHONE HCL 1 MG/ML IJ SOLN
1.0000 mg | Freq: Once | INTRAMUSCULAR | Status: AC
  Administered 2015-07-07: 1 mg via INTRAVENOUS
  Filled 2015-07-07: qty 1

## 2015-07-07 MED ORDER — IBUPROFEN 400 MG PO TABS
600.0000 mg | ORAL_TABLET | Freq: Once | ORAL | Status: AC
  Administered 2015-07-07: 600 mg via ORAL

## 2015-07-07 MED ORDER — SODIUM CHLORIDE 0.9 % IV SOLN
Freq: Once | INTRAVENOUS | Status: AC
  Administered 2015-07-07: 23:00:00 via INTRAVENOUS

## 2015-07-07 MED ORDER — ONDANSETRON HCL 4 MG/2ML IJ SOLN
4.0000 mg | Freq: Four times a day (QID) | INTRAMUSCULAR | Status: DC | PRN
  Administered 2015-07-08 – 2015-07-09 (×4): 4 mg via INTRAVENOUS
  Filled 2015-07-07 (×5): qty 2

## 2015-07-07 MED ORDER — ONDANSETRON HCL 4 MG PO TABS
4.0000 mg | ORAL_TABLET | Freq: Four times a day (QID) | ORAL | Status: DC | PRN
Start: 2015-07-07 — End: 2015-07-09

## 2015-07-07 NOTE — ED Provider Notes (Signed)
Patient speaks little [place]. History is obtained from professional interpreter using specific language line .Patient seen here yesterday and diagnosed with pyelonephritis complains of back pain nausea and vomiting. She's been unable to hold down the medicine prescribed yesterday including antibiotics due to vomiting.  Orlie Dakin, MD 07/07/15 2242

## 2015-07-07 NOTE — ED Notes (Signed)
Pt seen yesterday and dx with pyelonephritis. Pt sts she is unable to keep medications down d/t nv. Pt fever is getting worse.

## 2015-07-07 NOTE — ED Provider Notes (Signed)
CSN: [id number]     Arrival date & time 07/07/15  2104 History   First MD Initiated Contact with Patient 07/07/15 2144     Chief Complaint  Patient presents with  . Back Pain     (Consider location/radiation/quality/duration/timing/severity/associated sxs/prior Treatment) HPI Comments: Patient was seen in this emergency department yesterday, diagnosed with pyelonephritis which she's had in the past.  She was discharged home with Keflex.  Muscle relaxer, Tylenol, ibuprofen.  Returns with persistent nausea, vomiting, pain, and increasing fever. Is taking multiple doses of Tylenol and ibuprofen every 4-6 hours as well as the muscle relaxant.  She's been unable to eat or drink anything due to nausea and vomiting  Patient is a 29 y.o. female presenting with back pain. The history is provided by the patient.  Back Pain Location:  Lumbar spine Quality:  Aching Radiates to:  Does not radiate Pain severity:  Moderate Pain is:  Same all the time Onset quality:  Gradual Timing:  Constant Progression:  Worsening Chronicity:  New Context comment:  Pyelonephritis Relieved by:  Nothing Worsened by:  Nothing tried Ineffective treatments:  Ibuprofen and muscle relaxants Associated symptoms: fever   Associated symptoms: no dysuria   Associated symptoms comment:  Nausea and vomiting   Past Medical History  Diagnosis Date  . Diverticulitis   . Pyelonephritis   . Pyelonephritis   . Asthma    Past Surgical History  Procedure Laterality Date  . Cholecystectomy    . Cesarean section    . Cesarean section N/A 2006  . Cesarean section N/A 09/03/2014    Procedure: CESAREAN SECTION;  Surgeon: Guss Bunde, MD;  Location: [facility];  Service: Obstetrics;  Laterality: N/A;  . Tubal ligation     Family History  Problem Relation Age of Onset  . Hyperlipidemia Mother   . Diabetes Maternal Uncle   . Diabetes Paternal Grandmother    Social History  Substance Use Topics  . Smoking status: Never  Smoker   . Smokeless tobacco: Never Used  . Alcohol Use: No   OB History    This patient's OB History needs to be verified. Open OB History to review and resolve any issues.   Gravida Para Term Preterm AB TAB SAB Ectopic Multiple Living   5 3 1 2 2  2  1 2      Review of Systems  Constitutional: Positive for fever and chills.  Respiratory: Negative for shortness of breath.   Gastrointestinal: Positive for nausea and vomiting.  Genitourinary: Positive for frequency and flank pain. Negative for dysuria.  Musculoskeletal: Positive for back pain.  All other systems reviewed and are negative.     Allergies  Review of patient's allergies indicates no known allergies.  Home Medications   Prior to Admission medications   Medication Sig Start Date End Date Taking? Authorizing Provider  albuterol (PROVENTIL HFA;VENTOLIN HFA) 108 (90 BASE) MCG/ACT inhaler Inhale 4 puffs into the lungs every 6 (six) hours as needed for wheezing or shortness of breath. 08/13/14  Yes Mariel Aloe, MD  cephALEXin (KEFLEX) 500 MG capsule Take 1 capsule (500 mg total) by mouth 2 (two) times daily. 07/06/15  Yes Nicole Pisciotta, PA-C  hydrOXYzine (VISTARIL) 100 MG capsule Take 1 capsule (100 mg total) by mouth 3 (three) times daily as needed for anxiety. 06/13/15  Yes Waldemar Dickens, MD  ibuprofen (ADVIL,MOTRIN) 600 MG tablet Take 1 tablet (600 mg total) by mouth every 8 (eight) hours as needed for moderate pain. 05/14/15  Yes Malvin Johns, MD  methocarbamol (ROBAXIN) 500 MG tablet Take 1 tablet (500 mg total) by mouth every 6 (six) hours as needed for muscle spasms. 07/06/15  Yes Nicole Pisciotta, PA-C  metroNIDAZOLE (FLAGYL) 500 MG tablet Take 1 tablet (500 mg total) by mouth 2 (two) times daily. 07/06/15  Yes Nicole Pisciotta, PA-C  omeprazole (PRILOSEC) 40 MG capsule Take 1 capsule (40 mg total) by mouth daily. 06/13/15  Yes Waldemar Dickens, MD   BP 121/65 mmHg  Pulse 92  Temp(Src) 98.3 F (36.8 C) (Oral)   Resp 20  Ht 5' (1.524 m)  Wt 191 lb (86.637 kg)  BMI 37.30 kg/m2  SpO2 96%  LMP 06/23/2015 Physical Exam  Constitutional: She is oriented to person, place, and time. She appears well-developed and well-nourished.  HENT:  Head: Normocephalic.  Eyes: Pupils are equal, round, and reactive to light.  Neck: Normal range of motion.  Cardiovascular: Regular rhythm.  Tachycardia present.   Pulmonary/Chest: Effort normal.  Abdominal: Soft. She exhibits no distension. There is no tenderness.  Musculoskeletal: Normal range of motion. She exhibits no edema.  Neurological: She is alert and oriented to person, place, and time.  Skin: Skin is warm and dry.  Psychiatric: She has a normal mood and affect.  Nursing note and vitals reviewed.   ED Course  Procedures (including critical care time) Labs Review Labs Reviewed  COMPREHENSIVE METABOLIC PANEL - Abnormal; Notable for the following:    Potassium 3.1 (*)    Glucose, Bld 110 (*)    All other components within normal limits  CBC WITH DIFFERENTIAL/PLATELET - Abnormal; Notable for the following:    WBC 10.9 (*)    Neutro Abs 8.7 (*)    All other components within normal limits  URINALYSIS, ROUTINE W REFLEX MICROSCOPIC (NOT AT [facility]) - Abnormal; Notable for the following:    APPearance CLOUDY (*)    Leukocytes, UA SMALL (*)    All other components within normal limits  URINE MICROSCOPIC-ADD ON - Abnormal; Notable for the following:    Squamous Epithelial / LPF FEW (*)    All other components within normal limits  CULTURE, BLOOD (ROUTINE X 2)  CULTURE, BLOOD (ROUTINE X 2)  URINE CULTURE  BASIC METABOLIC PANEL  MAGNESIUM  I-STAT BETA HCG BLOOD, ED ([facility], [facility], [facility] ONLY)  I-STAT CG4 LACTIC ACID, ED  I-STAT CG4 LACTIC ACID, ED    Imaging Review No results found. I have personally reviewed and evaluated these images and lab results as part of my medical decision-making.   EKG Interpretation None     Will admit for outpatient therapy  failure diagnosis pyelonephritis.  I started IV fluids, IV Rocephin and Zofran for symptom control MDM   Final diagnoses:  Pyelonephritis         [name], NP 07/08/15 1761  Orlie Dakin, MD 07/08/15 2334

## 2015-07-08 ENCOUNTER — Observation Stay (HOSPITAL_COMMUNITY): Payer: Self-pay

## 2015-07-08 LAB — URINE CULTURE
Culture: >=100000
Culture: 2000

## 2015-07-08 LAB — BASIC METABOLIC PANEL
ANION GAP: 9 (ref 5–15)
BUN: 10 mg/dL (ref 6–20)
CHLORIDE: 104 mmol/L (ref 101–111)
CO2: 24 mmol/L (ref 22–32)
CREATININE: 0.86 mg/dL (ref 0.44–1.00)
Calcium: 8.6 mg/dL — ABNORMAL LOW (ref 8.9–10.3)
GFR calc non Af Amer: >60 mL/min (ref >60)
Glucose, Bld: 121 mg/dL — ABNORMAL HIGH (ref 65–99)
POTASSIUM: 3.3 mmol/L — AB (ref 3.5–5.1)
SODIUM: 137 mmol/L (ref 135–145)

## 2015-07-08 LAB — MAGNESIUM: MAGNESIUM: 1.9 mg/dL (ref 1.7–2.4)

## 2015-07-08 MED ORDER — SODIUM CHLORIDE 0.9 % IV SOLN
INTRAVENOUS | Status: AC
  Administered 2015-07-08: 11:00:00 via INTRAVENOUS

## 2015-07-08 MED ORDER — HYDROMORPHONE HCL 1 MG/ML IJ SOLN
0.5000 mg | INTRAMUSCULAR | Status: DC | PRN
  Administered 2015-07-08 – 2015-07-09 (×4): 0.5 mg via INTRAVENOUS
  Filled 2015-07-08 (×4): qty 1

## 2015-07-08 MED ORDER — CIPROFLOXACIN IN D5W 400 MG/200ML IV SOLN: 500.0000 mg | Freq: Two times a day (BID) | INTRAVENOUS | Status: DC

## 2015-07-08 MED ORDER — CIPROFLOXACIN IN D5W 400 MG/200ML IV SOLN: 400.0000 mg | Freq: Two times a day (BID) | INTRAVENOUS | Status: DC

## 2015-07-08 MED ORDER — SODIUM CHLORIDE 0.9 % IV SOLN
Freq: Once | INTRAVENOUS | Status: AC
  Administered 2015-07-08: 01:00:00 via INTRAVENOUS

## 2015-07-08 MED ORDER — DEXTROSE 5 % IV SOLN
1.0000 g | INTRAVENOUS | Status: DC
  Filled 2015-07-08: qty 10

## 2015-07-08 MED ORDER — SODIUM CHLORIDE 0.9 % IV BOLUS (SEPSIS): 1000.0000 mL | INTRAVENOUS | Status: DC

## 2015-07-08 MED ORDER — PROMETHAZINE HCL 25 MG/ML IJ SOLN
25.0000 mg | Freq: Four times a day (QID) | INTRAMUSCULAR | Status: DC | PRN
  Administered 2015-07-08: 25 mg via INTRAVENOUS
  Filled 2015-07-08: qty 1

## 2015-07-08 MED ORDER — POTASSIUM CHLORIDE 10 MEQ/100ML IV SOLN
10.0000 meq | INTRAVENOUS | Status: AC
  Administered 2015-07-08 (×5): 10 meq via INTRAVENOUS
  Filled 2015-07-08 (×5): qty 100

## 2015-07-08 MED ORDER — CIPROFLOXACIN IN D5W 400 MG/200ML IV SOLN
400.0000 mg | Freq: Two times a day (BID) | INTRAVENOUS | Status: DC
  Administered 2015-07-08 – 2015-07-10 (×5): 400 mg via INTRAVENOUS
  Filled 2015-07-08 (×4): qty 200

## 2015-07-08 MED ORDER — ALBUTEROL SULFATE (2.5 MG/3ML) 0.083% IN NEBU: 3.0000 mL | INHALATION_SOLUTION | RESPIRATORY_TRACT | Status: DC | PRN

## 2015-07-08 MED ORDER — ALBUTEROL SULFATE (2.5 MG/3ML) 0.083% IN NEBU: 3.0000 mL | INHALATION_SOLUTION | Freq: Four times a day (QID) | RESPIRATORY_TRACT | Status: DC | PRN

## 2015-07-08 MED ORDER — CIPROFLOXACIN IN D5W 400 MG/200ML IV SOLN
400.0000 mg | Freq: Two times a day (BID) | INTRAVENOUS | Status: DC
  Administered 2015-07-08: 400 mg via INTRAVENOUS
  Filled 2015-07-08: qty 200

## 2015-07-08 MED ORDER — METRONIDAZOLE IN NACL 5-0.79 MG/ML-% IV SOLN
500.0000 mg | Freq: Two times a day (BID) | INTRAVENOUS | Status: DC
  Administered 2015-07-08 – 2015-07-10 (×6): 500 mg via INTRAVENOUS
  Filled 2015-07-08 (×7): qty 100

## 2015-07-08 MED ORDER — PROMETHAZINE HCL 25 MG/ML IJ SOLN
12.5000 mg | Freq: Four times a day (QID) | INTRAMUSCULAR | Status: DC | PRN
  Administered 2015-07-08: 25 mg via INTRAVENOUS
  Filled 2015-07-08 (×3): qty 1

## 2015-07-08 MED ORDER — HYDROMORPHONE HCL 1 MG/ML IJ SOLN
0.5000 mg | Freq: Four times a day (QID) | INTRAMUSCULAR | Status: DC | PRN
  Administered 2015-07-08 (×2): 0.5 mg via INTRAVENOUS
  Filled 2015-07-08 (×2): qty 1

## 2015-07-08 MED ORDER — SODIUM CHLORIDE 0.9 % IV BOLUS (SEPSIS)
1000.0000 mL | Freq: Once | INTRAVENOUS | Status: AC
  Administered 2015-07-08: 1000 mL via INTRAVENOUS

## 2015-07-08 NOTE — H&P (Signed)
Date: 07/08/2015               Patient Name:  Lorraine [name] MRN: [id number]  DOB: Aug 11, 1986 Age / Sex: 29 y.o., female   PCP: No Pcp Per Patient         Medical Service: Internal Medicine Teaching Service         Attending Physician: Dr. Sid Falcon, MD    First Contact: Dr. [name] Pager: [phone]  Second Contact: Dr. Osa Craver Pager: [phone]       After Hours (After 5p/  First Contact Pager: [phone]  weekends / holidays): Second Contact Pager: [phone]   Chief Complaint: Flank Pain   History of Present Illness:   Patient is a 29 year old woman with a PMH of asthma who presents with flank pain, fever, and chills. Patient was Spanish-speaking, so history was obtained primarily by Dr. Posey Pronto. This all started one week ago, when she had pain and burning, urination, some flank pain, and white discharge. She had gone to the ED on 10/19, and was found to have UA consistent with a UTI, and a wet prep with clue cells. She was given IV ceftriaxone and prescribed Keflex and Metronidazole. However, she developed fever, nausea and vomiting 10/20, was unable to keep any of the pills down, and they decided to return to the ED. She has chest pain, but says it is reproducible with palpation. She has felt like her heart was racing we well. She endorses a dry cough, but no shortness of breath, rarely needing to use her inhaler.  She denies any headache, blackouts, skin changes, sore throat, new weakness, or vision changes. She had a previous episode of pyelonephritis in February 2016,  For which she received IV ceftriaxone and was discharged on Ciprofloxacin. Urine cultures eventually grew E coli that was resistant to ceftriaxone by sensitive for ciprofloxacin. She has had three C sections, with her most recent birth in December 2015. She has had a tubal ligation, and says she had a "bladder surgery" in 2010, but cannot provide any more details. In the ED, she had mild leukocytosis to 10.9  and UA showing positive more small leuks only. Blood and urine cultures were drawn. She was given a dose of ceftriaxone.   Meds: Current Facility-Administered Medications  Medication Dose Route Frequency Provider Last Rate Last Dose  . albuterol (PROVENTIL) (2.5 MG/3ML) 0.083% nebulizer solution 3 mL  3 mL Inhalation Q6H PRN Sid Falcon, MD      . enoxaparin (LOVENOX) injection 40 mg  40 mg Subcutaneous Q24H Rushil Patel V, MD      . HYDROmorphone (DILAUDID) injection 0.5 mg  0.5 mg Intravenous Q6H PRN Riccardo Dubin, MD   0.5 mg at 07/08/15 0403  . ibuprofen (ADVIL,MOTRIN) 200 MG tablet           . ondansetron (ZOFRAN) tablet 4 mg  4 mg Oral Q6H PRN Rushil Sherrye Payor, MD       Or  . ondansetron (ZOFRAN) injection 4 mg  4 mg Intravenous Q6H PRN Rushil Sherrye Payor, MD   4 mg at 07/08/15 0403  . potassium chloride 10 mEq in 100 mL IVPB  10 mEq Intravenous Q1 Hr x 5 Rushil Patel V, MD   10 mEq at 07/08/15 0345  . promethazine (PHENERGAN) injection 25 mg  25 mg Intravenous Q6H PRN Liberty Handy, MD        Allergies: Allergies as of 07/07/2015  . (No Known Allergies)  Past Medical History  Diagnosis Date  . Diverticulitis   . Pyelonephritis   . Pyelonephritis   . Asthma    Past Surgical History  Procedure Laterality Date  . Cholecystectomy    . Cesarean section    . Cesarean section N/A 2006  . Cesarean section N/A 09/03/2014    Procedure: CESAREAN SECTION;  Surgeon: Guss Bunde, MD;  Location: [facility];  Service: Obstetrics;  Laterality: N/A;  . Tubal ligation     Family History  Problem Relation Age of Onset  . Hyperlipidemia Mother   . Diabetes Maternal Uncle   . Diabetes Paternal Grandmother    Social History   Social History  . Marital Status: Married    Spouse Name: N/A  . Number of Children: N/A  . Years of Education: N/A   Occupational History  . Not on file.   Social History Main Topics  . Smoking status: Never Smoker   . Smokeless tobacco: Never Used  .  Alcohol Use: No  . Drug Use: No  . Sexual Activity: Yes    Birth Control/ Protection: Surgical, None   Other Topics Concern  . Not on file   Social History Narrative   ** Merged History Encounter **        Review of Systems: Negative except per HPI.  Physical Exam: Blood pressure 121/65, pulse 92, temperature 98.3 F (36.8 C), temperature source Oral, resp. rate 20, height 5' (1.524 m), weight 191 lb (86.637 kg), last menstrual period 06/23/2015, SpO2 96 %, currently breastfeeding. General: Uncomfortable woman lying in bed HEENT: Moist mucous membranes. EOMI. PERRL. No tonsillar erythema. Cardiovascular: RRR. No m/r/g Pulmonary: Clear to auscultation bilaterally Abdomen: Diffuse tenderness to palpation. Normal bowel sounds  Back: CVA tenderness Skin: Mildly diaphoretic. No rashes Neurological: 5/5 strength in all extremities. Tongue midline, face symmetric  Lab results: Basic Metabolic Panel:  Recent Labs  07/06/15 1015 07/07/15 2125  NA 140 136  K 3.4* 3.1*  CL 104 103  CO2 27 23  GLUCOSE 108* 110*  BUN 11 8  CREATININE 0.79 0.90  CALCIUM 9.1 9.3   Liver Function Tests:  Recent Labs  07/07/15 2125  AST 31  ALT 41  ALKPHOS 114  BILITOT 0.4  PROT 7.4  ALBUMIN 4.0   CBC:  Recent Labs  07/06/15 1015 07/07/15 2125  WBC 8.2 10.9*  NEUTROABS 4.5 8.7*  HGB 12.4 12.3  HCT 38.4 37.3  MCV 89.5 87.6  PLT 280 264   Urinalysis:  Recent Labs  07/06/15 1005 07/07/15 2228  COLORURINE YELLOW YELLOW  LABSPEC 1.017 1.020  PHURINE 6.0 6.0  GLUCOSEU NEGATIVE NEGATIVE  HGBUR LARGE* NEGATIVE  BILIRUBINUR NEGATIVE NEGATIVE  KETONESUR NEGATIVE NEGATIVE  PROTEINUR 100* NEGATIVE  UROBILINOGEN 0.2 0.2  NITRITE POSITIVE* NEGATIVE  LEUKOCYTESUR LARGE* SMALL*    Assessment & Plan by Problem:  Pyelonephritis: Pyelonephritis noted on 10/19 has progressed, and she has been unable to take her antibiotics. UA somewhat improved, likely due to dose of ceftriaxone  on 10/19.  - IV ceftriaxone, await cultures and sensitivities - previous culture resistant to ceftriaxone - zofran, if cannot tolerate, IV phenergan - dilaudid 0.5 mg q6h prn  Asthma:  Controlled. Albuterol q6h prn  DVT Prophylaxis: Lovenox [place]  Dispo: Disposition is deferred at this time, awaiting improvement of current medical problems. Anticipated discharge in approximately 2-3 day(s).   The patient does not have a current PCP (No Pcp Per Patient) and does not need an [facility] hospital follow-up appointment  after discharge.  The patient does have transportation limitations that hinder transportation to clinic appointments.  Signed: Liberty Handy, MD 07/08/2015, 5:06 AM

## 2015-07-08 NOTE — Progress Notes (Signed)
Patient ID: Lorraine [name], female   DOB: 11-30-1985, 29 y.o.   MRN: [id number]   Subjective: [name] back pain, nausea, and dysuria are slightly improved today. Her last bowel movement was 2 days ago. She was able to eat some jello without a problem. She understands the plan for the day and has no other complaints.  Objective: Vital signs in last 24 hours: Filed Vitals:   07/08/15 0010 07/08/15 0555 07/08/15 0804 07/08/15 1313  BP: 121/65 119/67 109/63 123/84  Pulse: 92 84 71 85  Temp: 98.3 F (36.8 C) 97.9 F (36.6 C) 97.5 F (36.4 C) 97.9 F (36.6 C)  TempSrc: Oral Oral Oral Oral  Resp: 20 18  16   Height: 5' (1.524 m)     Weight:      SpO2: 96% 100%  100%   General: resting in bed HEENT: no scleral icterus. EOMI Cardiac: RRR, no rubs, murmurs or gallops Pulm: breathing well CTAB Abd: BS normoactive, slightly tender in right lower quadrant, with +CVA tenderness on right flank Ext: warm and well perfused, no pedal edema Skin: no rash Neuro: alert and oriented X3, cranial nerves II-XII grossly intact  Lab Results: Basic Metabolic Panel:  Recent Labs Lab 07/07/15 2125 07/08/15 0517  NA 136 137  K 3.1* 3.3*  CL 103 104  CO2 23 24  GLUCOSE 110* 121*  BUN 8 10  CREATININE 0.90 0.86  CALCIUM 9.3 8.6*  MG  --  1.9   Micro Results: Recent Results (from the past 240 hour(s))  Urine culture     Status: None   Collection Time: 07/06/15 10:05 AM  Result Value Ref Range Status   Specimen Description URINE, RANDOM  Final   Special Requests ADDED [id number]  Final   Culture >=100,000 COLONIES/mL ESCHERICHIA COLI  Final   Report Status 07/08/2015 FINAL  Final   Organism ID, Bacteria ESCHERICHIA COLI  Final      Susceptibility   Escherichia coli - MIC*    AMPICILLIN >=32 RESISTANT Resistant     CEFAZOLIN <=4 SENSITIVE Sensitive     CEFTRIAXONE <=1 SENSITIVE Sensitive     CIPROFLOXACIN 0.5 SENSITIVE Sensitive     GENTAMICIN <=1 SENSITIVE Sensitive    IMIPENEM <=0.25 SENSITIVE Sensitive     NITROFURANTOIN <=16 SENSITIVE Sensitive     TRIMETH/SULFA <=20 SENSITIVE Sensitive     AMPICILLIN/SULBACTAM 16 INTERMEDIATE Intermediate     PIP/TAZO <=4 SENSITIVE Sensitive     * >=100,000 COLONIES/mL ESCHERICHIA COLI  Wet prep, genital     Status: Abnormal   Collection Time: 07/06/15 10:27 AM  Result Value Ref Range Status   Yeast Wet Prep HPF POC NONE SEEN NONE SEEN Final   Trich, Wet Prep NONE SEEN NONE SEEN Final   Clue Cells Wet Prep HPF POC MANY (A) NONE SEEN Final   WBC, Wet Prep HPF POC FEW (A) NONE SEEN Final  Urine culture     Status: None (Preliminary result)   Collection Time: 07/07/15 10:29 PM  Result Value Ref Range Status   Specimen Description URINE, CLEAN CATCH  Final   Special Requests NONE  Final   Culture NO GROWTH < 12 HOURS  Final   Report Status PENDING  Incomplete   Studies/Results: Dg Abd 1 View  07/08/2015  CLINICAL DATA:  Abdominal pain, vomiting, fever, and chills. EXAM: ABDOMEN - 1 VIEW COMPARISON:  None. FINDINGS: No evidence of dilated bowel loops. Scattered air-fluid walls are seen within the transverse portion of colon. This is  nonspecific but can be seen with enterocolitis. Right upper quadrant surgical clips seen as well as clips from previous bilateral tubal ligation. IMPRESSION: Nonobstructive bowel gas pattern. Colonic air-fluid levels are nonspecific but can be seen with enterocolitis. Electronically Signed   By: Earle Gell M.D.   On: 07/08/2015 13:25   Medications: I have reviewed the patient's current medications. Scheduled Meds: . ciprofloxacin  400 mg Intravenous Q12H  . enoxaparin (LOVENOX) injection  40 mg Subcutaneous Q24H  . metronidazole  500 mg Intravenous Q12H  . sodium chloride  1,000 mL Intravenous Q1H  . sodium chloride  1,000 mL Intravenous Once   Continuous Infusions: . sodium chloride 150 mL/hr at 07/08/15 1317   PRN Meds:.albuterol, HYDROmorphone (DILAUDID) injection, ondansetron  **OR** ondansetron (ZOFRAN) IV, promethazine   Assessment/Plan:  Pyelonephritis: Clinically improving slowly. Her cultures from 2 days ago showed E. Coli sensitive to Ceftriaxone and quinolones, but we'll go with ciprofloxacin as she has a history of ceftriaxone-resistant E. Coli. -Continue IV ciprofloxacin for now -Dilaudid 0.5mg  every 3 hours as needed -Bolused 1L today -Continue NS at 150cc/hr  Nausea and vomiting: I think this is all from the pyelo but SBO should remain on the differential as she has had multiple C-sections. -Continue IV phenergan -Low index of suspicion to get KUB -Advance diet as tolerated  Dispo: Disposition is deferred at this time, awaiting improvement of current medical problems.   The patient does not have a current PCP (No Pcp Per Patient) and does need an [facility] hospital follow-up appointment after discharge.  The patient does not know have transportation limitations that hinder transportation to clinic appointments.  .Services Needed at time of discharge: Y = Yes, Blank = No PT:   OT:   RN:   Equipment:   Other:       Lorraine Chance, MD 07/08/2015, 1:39 PM

## 2015-07-08 NOTE — Care Management Note (Addendum)
Case Management Note  Patient Details  Name: Lorraine [name] MRN: [id number] Date of Birth: [birth year]  Subjective/Objective:        Date: 07/08/15 Spoke with patient at the bedside with interpreter. Introduced self as Tourist information centre manager and explained role in discharge planning and how to be reached. Verified patient lives in town,  with family,  . Expressed potential need for no other DME. Verified patient anticipates to go home with family at time of discharge and will have full-time  supervision by family  at this time to best of their knowledge. Patient confirmed  needing help with their medication. Patient  is driven by  Spouse or friend to MD appointments. Verified [facility]   Plan: CM will continue to follow for discharge planning and [facility] resources.             Action/Plan:   Expected Discharge Date:                  Expected Discharge Plan:  Home/Self Care  In-House Referral:     Discharge planning Services  CM Consult, [facility], Medication Assistance, Follow-up appt scheduled  Post Acute Care Choice:    Choice offered to:     DME Arranged:    DME Agency:     HH Arranged:    HH Agency:     Status of Service:  In process, will continue to follow  Medicare Important Message Given:    Date Medicare IM Given:    Medicare IM give by:    Date Additional Medicare IM Given:    Additional Medicare Important Message give by:     If discussed at [place] of Stay Meetings, dates discussed:    Additional Comments:  Zenon Mayo, RN 07/08/2015, 4:41 PM

## 2015-07-08 NOTE — Progress Notes (Signed)
Interpreter Lesle Chris for [place] manager

## 2015-07-09 ENCOUNTER — Inpatient Hospital Stay (HOSPITAL_COMMUNITY): Payer: Self-pay

## 2015-07-09 ENCOUNTER — Telehealth (HOSPITAL_COMMUNITY): Payer: Self-pay

## 2015-07-09 DIAGNOSIS — N760 Acute vaginitis: Secondary | ICD-10-CM

## 2015-07-09 DIAGNOSIS — A499 Bacterial infection, unspecified: Secondary | ICD-10-CM

## 2015-07-09 DIAGNOSIS — B9689 Other specified bacterial agents as the cause of diseases classified elsewhere: Secondary | ICD-10-CM | POA: Insufficient documentation

## 2015-07-09 DIAGNOSIS — N12 Tubulo-interstitial nephritis, not specified as acute or chronic: Principal | ICD-10-CM

## 2015-07-09 DIAGNOSIS — E876 Hypokalemia: Secondary | ICD-10-CM

## 2015-07-09 LAB — BASIC METABOLIC PANEL
Anion gap: 7 (ref 5–15)
BUN: 5 mg/dL — AB (ref 6–20)
CALCIUM: 8.6 mg/dL — AB (ref 8.9–10.3)
CO2: 28 mmol/L (ref 22–32)
CREATININE: 0.85 mg/dL (ref 0.44–1.00)
Chloride: 99 mmol/L — ABNORMAL LOW (ref 101–111)
GFR calc non Af Amer: >60 mL/min (ref >60)
GLUCOSE: 102 mg/dL — AB (ref 65–99)
Potassium: 3.2 mmol/L — ABNORMAL LOW (ref 3.5–5.1)
Sodium: 134 mmol/L — ABNORMAL LOW (ref 135–145)

## 2015-07-09 LAB — MRSA PCR SCREENING: MRSA by PCR: NEGATIVE

## 2015-07-09 LAB — CBC
HCT: 35.5 % — ABNORMAL LOW (ref 36.0–46.0)
Hemoglobin: 11.8 g/dL — ABNORMAL LOW (ref 12.0–15.0)
MCH: 29.3 pg (ref 26.0–34.0)
MCHC: 33.2 g/dL (ref 30.0–36.0)
MCV: 88.1 fL (ref 78.0–100.0)
PLATELETS: 264 10*3/uL (ref 150–400)
RBC: 4.03 MIL/uL (ref 3.87–5.11)
RDW: 12.8 % (ref 11.5–15.5)
WBC: 8.1 10*3/uL (ref 4.0–10.5)

## 2015-07-09 LAB — MAGNESIUM: MAGNESIUM: 1.9 mg/dL (ref 1.7–2.4)

## 2015-07-09 MED ORDER — POTASSIUM CHLORIDE CRYS ER 20 MEQ PO TBCR
40.0000 meq | EXTENDED_RELEASE_TABLET | Freq: Two times a day (BID) | ORAL | Status: AC
  Administered 2015-07-09 (×2): 40 meq via ORAL
  Filled 2015-07-09 (×2): qty 2

## 2015-07-09 MED ORDER — PROMETHAZINE HCL 25 MG/ML IJ SOLN
12.5000 mg | INTRAMUSCULAR | Status: DC | PRN
  Administered 2015-07-10: 25 mg via INTRAVENOUS
  Administered 2015-07-11: 12.5 mg via INTRAVENOUS
  Filled 2015-07-09 (×2): qty 1

## 2015-07-09 MED ORDER — HYDROMORPHONE HCL 1 MG/ML IJ SOLN
1.0000 mg | INTRAMUSCULAR | Status: DC | PRN
  Administered 2015-07-09 – 2015-07-10 (×3): 1 mg via INTRAVENOUS
  Filled 2015-07-09 (×2): qty 1

## 2015-07-09 NOTE — Telephone Encounter (Signed)
Post ED Visit - Positive Culture Follow-up  Culture report reviewed by antimicrobial stewardship pharmacist:  []  Heide Guile, Pharm.D., BCPS []  Alycia Rossetti, Pharm.D., BCPS []  [place], Pharm.D., BCPS, AAHIVP []  Legrand Como, Pharm.D., BCPS, AAHIVP []  [employer], Pharm.D. [x]  Milus Glazier, [place].D.  Positive urine culture Treated with cephalexin, organism sensitive to the same and no further patient follow-up is required at this time.  Ileene Musa 07/09/2015, 11:02 AM

## 2015-07-09 NOTE — Progress Notes (Signed)
Subjective:  Patient was seen and examined this morning. She was interviewed using a Spanish interpreter phone. Patient admits to nausea, one episode of non-bloody vomiting overnight, and persistent bilateral lower back pain. She also has pain in her abdomen under her ribs. Both areas are dull and achy, 6/10 pain. She admits to chills as well. The pain medication and nausea medication work for a little while, but then the symptoms come back. This is similar to when she had a UTI in February, but her symptoms are much worse this time.   Patient has not had a bowel movement in 2 days; however, she does not feel constipated. She does not think she is passing gas.    Objective: Filed Vitals:   07/08/15 1313 07/08/15 1810 07/08/15 2149 07/09/15 0615  BP: 123/84  116/65 111/64  Pulse: 85  97 80  Temp: 97.9 F (36.6 C) 99 F (37.2 C) 99.6 F (37.6 C) 97.9 F (36.6 C)  TempSrc: Oral Oral Oral Oral  Resp: 16  19 20   Height:      Weight:      SpO2: 100%  99% 96%   General: Vital signs reviewed.  Patient is obese, in mild acute distress and cooperative with exam.  Cardiovascular: RRR, S1 normal, S2 normal, no murmurs, gallops, or rubs. Pulmonary/Chest: Clear to auscultation bilaterally, no wheezes, rales, or rhonchi. Abdominal: Soft, mildly tender to palpation in RUQ, RLQ and LUQ, non-distended, hypoactive BS, no masses, organomegaly, or guarding present. +CVA tenderness bilaterally Extremities: No lower extremity edema bilaterally, pulses symmetric and intact bilaterally. Neurological: A&O x3 Skin: Warm, dry and intact. No rashes or erythema.   Weight change:   Intake/Output Summary (Last 24 hours) at 07/09/15 1022 Last data filed at 07/09/15 0847  Gross per 24 hour  Intake 1453.34 ml  Output    300 ml  Net 1153.34 ml    Lab Results: Basic Metabolic Panel:  Recent Labs Lab 07/08/15 0517 07/09/15 0437  NA 137 134*  K 3.3* 3.2*  CL 104 99*  CO2 24 28  GLUCOSE 121* 102*    BUN 10 5*  CREATININE 0.86 0.85  CALCIUM 8.6* 8.6*  MG 1.9 1.9   Liver Function Tests:  Recent Labs Lab 07/07/15 2125  AST 31  ALT 41  ALKPHOS 114  BILITOT 0.4  PROT 7.4  ALBUMIN 4.0   CBC:  Recent Labs Lab 07/06/15 1015 07/07/15 2125 07/09/15 0437  WBC 8.2 10.9* 8.1  NEUTROABS 4.5 8.7*  --   HGB 12.4 12.3 11.8*  HCT 38.4 37.3 35.5*  MCV 89.5 87.6 88.1  PLT 280 264 264   Urine Drug Screen: Drugs of Abuse     Component Value Date/Time   LABOPIA NONE DETECTED 08/02/2014 2220   LABOPIA NEG 03/02/2014 0836   COCAINSCRNUR NONE DETECTED 08/02/2014 2220   COCAINSCRNUR NEG 03/02/2014 0836   LABBENZ NONE DETECTED 08/02/2014 2220   LABBENZ NEG 03/02/2014 0836   AMPHETMU NONE DETECTED 08/02/2014 2220   AMPHETMU NEG 03/02/2014 0836   THCU NONE DETECTED 08/02/2014 2220   THCU NEG 03/02/2014 0836   LABBARB NONE DETECTED 08/02/2014 2220   LABBARB NEG 03/02/2014 0836    Urinalysis:  Recent Labs Lab 07/06/15 1005 07/07/15 2228  COLORURINE YELLOW YELLOW  LABSPEC 1.017 1.020  PHURINE 6.0 6.0  GLUCOSEU NEGATIVE NEGATIVE  HGBUR LARGE* NEGATIVE  BILIRUBINUR NEGATIVE NEGATIVE  KETONESUR NEGATIVE NEGATIVE  PROTEINUR 100* NEGATIVE  UROBILINOGEN 0.2 0.2  NITRITE POSITIVE* NEGATIVE  LEUKOCYTESUR LARGE* SMALL*  Micro Results: Recent Results (from the past 240 hour(s))  Urine culture     Status: None   Collection Time: 07/06/15 10:05 AM  Result Value Ref Range Status   Specimen Description URINE, RANDOM  Final   Special Requests ADDED [id number]  Final   Culture >=100,000 COLONIES/mL ESCHERICHIA COLI  Final   Report Status 07-14-15 FINAL  Final   Organism ID, Bacteria ESCHERICHIA COLI  Final      Susceptibility   Escherichia coli - MIC*    AMPICILLIN >=32 RESISTANT Resistant     CEFAZOLIN <=4 SENSITIVE Sensitive     CEFTRIAXONE <=1 SENSITIVE Sensitive     CIPROFLOXACIN 0.5 SENSITIVE Sensitive     GENTAMICIN <=1 SENSITIVE Sensitive     IMIPENEM <=0.25  SENSITIVE Sensitive     NITROFURANTOIN <=16 SENSITIVE Sensitive     TRIMETH/SULFA <=20 SENSITIVE Sensitive     AMPICILLIN/SULBACTAM 16 INTERMEDIATE Intermediate     PIP/TAZO <=4 SENSITIVE Sensitive     * >=100,000 COLONIES/mL ESCHERICHIA COLI  Wet prep, genital     Status: Abnormal   Collection Time: 07/06/15 10:27 AM  Result Value Ref Range Status   Yeast Wet Prep HPF POC NONE SEEN NONE SEEN Final   Trich, Wet Prep NONE SEEN NONE SEEN Final   Clue Cells Wet Prep HPF POC MANY (A) NONE SEEN Final   WBC, Wet Prep HPF POC FEW (A) NONE SEEN Final  Culture, blood (routine x 2)     Status: None (Preliminary result)   Collection Time: 07/07/15  9:25 PM  Result Value Ref Range Status   Specimen Description BLOOD RIGHT ARM  Final   Special Requests BOTTLES DRAWN AEROBIC AND ANAEROBIC 5CC  Final   Culture NO GROWTH 1 DAY  Final   Report Status PENDING  Incomplete  Culture, blood (routine x 2)     Status: None (Preliminary result)   Collection Time: 07/07/15  9:30 PM  Result Value Ref Range Status   Specimen Description BLOOD LEFT ARM  Final   Special Requests BOTTLES DRAWN AEROBIC AND ANAEROBIC 5CC  Final   Culture NO GROWTH 1 DAY  Final   Report Status PENDING  Incomplete  Urine culture     Status: None   Collection Time: 07/07/15 10:29 PM  Result Value Ref Range Status   Specimen Description URINE, CLEAN CATCH  Final   Special Requests NONE  Final   Culture 2,000 COLONIES/mL INSIGNIFICANT GROWTH  Final   Report Status 14-Jul-2015 FINAL  Final  MRSA PCR Screening     Status: None   Collection Time: 14-Jul-2015 10:11 PM  Result Value Ref Range Status   MRSA by PCR NEGATIVE NEGATIVE Final    Comment:        The GeneXpert MRSA Assay (FDA approved for NASAL specimens only), is one component of a comprehensive MRSA colonization surveillance program. It is not intended to diagnose MRSA infection nor to guide or monitor treatment for MRSA infections.    Studies/Results: Dg Abd 1  View  07/14/2015  CLINICAL DATA:  Abdominal pain, vomiting, fever, and chills. EXAM: ABDOMEN - 1 VIEW COMPARISON:  None. FINDINGS: No evidence of dilated bowel loops. Scattered air-fluid walls are seen within the transverse portion of colon. This is nonspecific but can be seen with enterocolitis. Right upper quadrant surgical clips seen as well as clips from previous bilateral tubal ligation. IMPRESSION: Nonobstructive bowel gas pattern. Colonic air-fluid levels are nonspecific but can be seen with enterocolitis. Electronically Signed  By: Earle Gell M.D.   On: 07/08/2015 13:25   Medications:  I have reviewed the patient's current medications. Prior to Admission:  Prescriptions prior to admission  Medication Sig Dispense Refill Last Dose  . albuterol (PROVENTIL HFA;VENTOLIN HFA) 108 (90 BASE) MCG/ACT inhaler Inhale 4 puffs into the lungs every 6 (six) hours as needed for wheezing or shortness of breath. 1 Inhaler 2 Past Week at Unknown time  . cephALEXin (KEFLEX) 500 MG capsule Take 1 capsule (500 mg total) by mouth 2 (two) times daily. 28 capsule 0 07/07/2015 at Unknown time  . hydrOXYzine (VISTARIL) 100 MG capsule Take 1 capsule (100 mg total) by mouth 3 (three) times daily as needed for anxiety. 30 capsule 0 07/07/2015 at Unknown time  . ibuprofen (ADVIL,MOTRIN) 600 MG tablet Take 1 tablet (600 mg total) by mouth every 8 (eight) hours as needed for moderate pain. 15 tablet 0 07/07/2015 at Unknown time  . methocarbamol (ROBAXIN) 500 MG tablet Take 1 tablet (500 mg total) by mouth every 6 (six) hours as needed for muscle spasms. 13 tablet 0 07/07/2015 at Unknown time  . metroNIDAZOLE (FLAGYL) 500 MG tablet Take 1 tablet (500 mg total) by mouth 2 (two) times daily. 14 tablet 0 07/07/2015 at Unknown time  . omeprazole (PRILOSEC) 40 MG capsule Take 1 capsule (40 mg total) by mouth daily. 30 capsule 0 07/07/2015 at Unknown time   Scheduled Meds: . ciprofloxacin  400 mg Intravenous Q12H  .  enoxaparin (LOVENOX) injection  40 mg Subcutaneous Q24H  . metronidazole  500 mg Intravenous Q12H  . potassium chloride  40 mEq Oral BID   Continuous Infusions:  PRN Meds:.albuterol, HYDROmorphone (DILAUDID) injection, promethazine Assessment/Plan: Active Problems:   Pyelonephritis  Pyelonephritis: Patient continues to have nausea, vomiting and abdominal pain. KUB was negative for obstruction. She has been afebrile for > 24 hours, WBC has trended down from 10.9 to 8.1 this morning. UCx from 10/19 grew E. Coli sensitive to Ceftriaxone. Patient appears to be improving clinically, but she continues to have uncontrolled pain with nausea. We will check a bilateral renal ultrasound to rule out abscess and nephrolithiasis. UCx and BCx from 10/21 NGTD.  -Renal ultrasound -Clear liquid diet, ADAT -Continue IV Ciprofloxacin  -Phenergan 12.5 mg - 25 mg IV Q4H prn  -Increase Dilaudid to 1 mg Q4H prn  -Repeat CBC tomorrow am  Bacterial Vaginosis: Positive for BV when tested in the ED on 10/19. She was unable to tolerate po medications due to nausea/vomiting. Patient started on Flagyl IV. -Metronidazole 500 mg IV BID (started 10/21; end date: 10/27)  Hypokalemia: Potassium 3.2 this morning.  -KDur 40 mEq x 2 -Repeat BMET tomorrow am  DVT/PE ppx: Lovenox SQ QD  Dispo: Disposition is deferred at this time, awaiting improvement of current medical problems.  Anticipated discharge in approximately 1-2 day(s).   The patient does not have a current PCP (No Pcp Per Patient) and does need an [facility] hospital follow-up appointment after discharge.  The patient does not have transportation limitations that hinder transportation to clinic appointments.  .Services Needed at time of discharge: Y = Yes, Blank = No PT:   OT:   RN:   Equipment:   Other:     LOS: 1 day   Lorraine Craver, DO PGY-1 Internal Medicine Resident Pager # [phone] 07/09/2015 10:22 AM

## 2015-07-10 DIAGNOSIS — R197 Diarrhea, unspecified: Secondary | ICD-10-CM

## 2015-07-10 LAB — BASIC METABOLIC PANEL
Anion gap: 10 (ref 5–15)
BUN: 7 mg/dL (ref 6–20)
CHLORIDE: 103 mmol/L (ref 101–111)
CO2: 25 mmol/L (ref 22–32)
CREATININE: 0.8 mg/dL (ref 0.44–1.00)
Calcium: 8.9 mg/dL (ref 8.9–10.3)
GFR calc Af Amer: >60 mL/min (ref >60)
GFR calc non Af Amer: >60 mL/min (ref >60)
Glucose, Bld: 100 mg/dL — ABNORMAL HIGH (ref 65–99)
POTASSIUM: 3.9 mmol/L (ref 3.5–5.1)
Sodium: 138 mmol/L (ref 135–145)

## 2015-07-10 LAB — CBC
HEMATOCRIT: 37.6 % (ref 36.0–46.0)
HEMOGLOBIN: 12.5 g/dL (ref 12.0–15.0)
MCH: 28.9 pg (ref 26.0–34.0)
MCHC: 33.2 g/dL (ref 30.0–36.0)
MCV: 87 fL (ref 78.0–100.0)
Platelets: 281 10*3/uL (ref 150–400)
RBC: 4.32 MIL/uL (ref 3.87–5.11)
RDW: 12.7 % (ref 11.5–15.5)
WBC: 7.2 10*3/uL (ref 4.0–10.5)

## 2015-07-10 LAB — C DIFFICILE QUICK SCREEN W PCR REFLEX
C DIFFICILE (CDIFF) TOXIN: NEGATIVE
C Diff antigen: NEGATIVE
C Diff interpretation: NEGATIVE

## 2015-07-10 MED ORDER — HYDROMORPHONE HCL 1 MG/ML IJ SOLN
1.0000 mg | INTRAMUSCULAR | Status: DC | PRN
  Administered 2015-07-10 – 2015-07-11 (×2): 1 mg via INTRAVENOUS
  Filled 2015-07-10 (×2): qty 1

## 2015-07-10 MED ORDER — SODIUM CHLORIDE 0.9 % IV SOLN
INTRAVENOUS | Status: DC
  Administered 2015-07-10 – 2015-07-11 (×3): via INTRAVENOUS

## 2015-07-10 MED ORDER — KETOROLAC TROMETHAMINE 30 MG/ML IJ SOLN: 30.0000 mg | Freq: Four times a day (QID) | INTRAMUSCULAR | Status: DC | PRN

## 2015-07-10 MED ORDER — KETOROLAC TROMETHAMINE 30 MG/ML IJ SOLN
30.0000 mg | Freq: Once | INTRAMUSCULAR | Status: AC
  Administered 2015-07-10: 30 mg via INTRAVENOUS
  Filled 2015-07-10: qty 1

## 2015-07-10 NOTE — Progress Notes (Signed)
Subjective:  Patient was seen and examined this morning. She was interviewed using a Spanish interpreter phone. Patient reports 2 episodes of non-bloody vomiting last night. She continues to have abdominal pain and flank pain 6/10. Pain medication brings it down to a 5/10. She denies any fever or chills. She has not been able to tolerate any oral intake.   Objective: Filed Vitals:   07/09/15 0615 07/09/15 1336 07/09/15 2251 07/10/15 0520  BP: 111/64 120/66 115/67 106/55  Pulse: 80 83 89 80  Temp: 97.9 F (36.6 C) 98.7 F (37.1 C) 99.6 F (37.6 C) 98.3 F (36.8 C)  TempSrc: Oral Oral Oral Oral  Resp: 20 18 18 18   Height:      Weight:      SpO2: 96% 95% 100% 100%   General: Vital signs reviewed.  Patient is obese, in mild acute distress and cooperative with exam.  Cardiovascular: RRR, S1 normal, S2 normal Pulmonary/Chest: Clear to auscultation bilaterally, no wheezes, rales, or rhonchi. Abdominal: Soft, obese, mildly tender to palpation in RUQ and LUQ, non-distended, normoactive BS, no guarding present. +CVA tenderness bilaterally Extremities: No lower extremity edema bilaterally, pulses symmetric and intact bilaterally.     Weight change:   Intake/Output Summary (Last 24 hours) at 07/10/15 0945 Last data filed at 07/10/15 0942  Gross per 24 hour  Intake    950 ml  Output      0 ml  Net    950 ml    Lab Results: Basic Metabolic Panel:  Recent Labs Lab 07/08/15 0517 07/09/15 0437 07/10/15 0346  NA 137 134* 138  K 3.3* 3.2* 3.9  CL 104 99* 103  CO2 24 28 25   GLUCOSE 121* 102* 100*  BUN 10 5* 7  CREATININE 0.86 0.85 0.80  CALCIUM 8.6* 8.6* 8.9  MG 1.9 1.9  --    Liver Function Tests:  Recent Labs Lab 07/07/15 2125  AST 31  ALT 41  ALKPHOS 114  BILITOT 0.4  PROT 7.4  ALBUMIN 4.0   CBC:  Recent Labs Lab 07/06/15 1015 07/07/15 2125 07/09/15 0437 07/10/15 0346  WBC 8.2 10.9* 8.1 7.2  NEUTROABS 4.5 8.7*  --   --   HGB 12.4 12.3 11.8* 12.5  HCT  38.4 37.3 35.5* 37.6  MCV 89.5 87.6 88.1 87.0  PLT 280 264 264 281   Urine Drug Screen: Drugs of Abuse     Component Value Date/Time   LABOPIA NONE DETECTED 08/02/2014 2220   LABOPIA NEG 03/02/2014 0836   COCAINSCRNUR NONE DETECTED 08/02/2014 2220   COCAINSCRNUR NEG 03/02/2014 0836   LABBENZ NONE DETECTED 08/02/2014 2220   LABBENZ NEG 03/02/2014 0836   AMPHETMU NONE DETECTED 08/02/2014 2220   AMPHETMU NEG 03/02/2014 0836   THCU NONE DETECTED 08/02/2014 2220   THCU NEG 03/02/2014 0836   LABBARB NONE DETECTED 08/02/2014 2220   LABBARB NEG 03/02/2014 0836    Urinalysis:  Recent Labs Lab 07/06/15 1005 07/07/15 2228  COLORURINE YELLOW YELLOW  LABSPEC 1.017 1.020  PHURINE 6.0 6.0  GLUCOSEU NEGATIVE NEGATIVE  HGBUR LARGE* NEGATIVE  BILIRUBINUR NEGATIVE NEGATIVE  KETONESUR NEGATIVE NEGATIVE  PROTEINUR 100* NEGATIVE  UROBILINOGEN 0.2 0.2  NITRITE POSITIVE* NEGATIVE  LEUKOCYTESUR LARGE* SMALL*   Micro Results: Recent Results (from the past 240 hour(s))  Urine culture     Status: None   Collection Time: 07/06/15 10:05 AM  Result Value Ref Range Status   Specimen Description URINE, RANDOM  Final   Special Requests ADDED [id number]  Final   Culture >=100,000 COLONIES/mL ESCHERICHIA COLI  Final   Report Status 07/08/2015 FINAL  Final   Organism ID, Bacteria ESCHERICHIA COLI  Final      Susceptibility   Escherichia coli - MIC*    AMPICILLIN >=32 RESISTANT Resistant     CEFAZOLIN <=4 SENSITIVE Sensitive     CEFTRIAXONE <=1 SENSITIVE Sensitive     CIPROFLOXACIN 0.5 SENSITIVE Sensitive     GENTAMICIN <=1 SENSITIVE Sensitive     IMIPENEM <=0.25 SENSITIVE Sensitive     NITROFURANTOIN <=16 SENSITIVE Sensitive     TRIMETH/SULFA <=20 SENSITIVE Sensitive     AMPICILLIN/SULBACTAM 16 INTERMEDIATE Intermediate     PIP/TAZO <=4 SENSITIVE Sensitive     * >=100,000 COLONIES/mL ESCHERICHIA COLI  Wet prep, genital     Status: Abnormal   Collection Time: 07/06/15 10:27 AM  Result  Value Ref Range Status   Yeast Wet Prep HPF POC NONE SEEN NONE SEEN Final   Trich, Wet Prep NONE SEEN NONE SEEN Final   Clue Cells Wet Prep HPF POC MANY (A) NONE SEEN Final   WBC, Wet Prep HPF POC FEW (A) NONE SEEN Final  Culture, blood (routine x 2)     Status: None (Preliminary result)   Collection Time: 07/07/15  9:25 PM  Result Value Ref Range Status   Specimen Description BLOOD RIGHT ARM  Final   Special Requests BOTTLES DRAWN AEROBIC AND ANAEROBIC 5CC  Final   Culture NO GROWTH 2 DAYS  Final   Report Status PENDING  Incomplete  Culture, blood (routine x 2)     Status: None (Preliminary result)   Collection Time: 07/07/15  9:30 PM  Result Value Ref Range Status   Specimen Description BLOOD LEFT ARM  Final   Special Requests BOTTLES DRAWN AEROBIC AND ANAEROBIC 5CC  Final   Culture NO GROWTH 2 DAYS  Final   Report Status PENDING  Incomplete  Urine culture     Status: None   Collection Time: 07/07/15 10:29 PM  Result Value Ref Range Status   Specimen Description URINE, CLEAN CATCH  Final   Special Requests NONE  Final   Culture 2,000 COLONIES/mL INSIGNIFICANT GROWTH  Final   Report Status 07/08/2015 FINAL  Final  MRSA PCR Screening     Status: None   Collection Time: 07/08/15 10:11 PM  Result Value Ref Range Status   MRSA by PCR NEGATIVE NEGATIVE Final    Comment:        The GeneXpert MRSA Assay (FDA approved for NASAL specimens only), is one component of a comprehensive MRSA colonization surveillance program. It is not intended to diagnose MRSA infection nor to guide or monitor treatment for MRSA infections.    Studies/Results: Dg Abd 1 View  07/08/2015  CLINICAL DATA:  Abdominal pain, vomiting, fever, and chills. EXAM: ABDOMEN - 1 VIEW COMPARISON:  None. FINDINGS: No evidence of dilated bowel loops. Scattered air-fluid walls are seen within the transverse portion of colon. This is nonspecific but can be seen with enterocolitis. Right upper quadrant surgical clips seen  as well as clips from previous bilateral tubal ligation. IMPRESSION: Nonobstructive bowel gas pattern. Colonic air-fluid levels are nonspecific but can be seen with enterocolitis. Electronically Signed   By: Earle Gell M.D.   On: 07/08/2015 13:25   US Renal  07/09/2015  CLINICAL DATA:  Pyelonephritis.  Right flank pain for 1 week. EXAM: RENAL / URINARY TRACT ULTRASOUND COMPLETE COMPARISON:  CT abdomen and pelvis 05/18/2015 FINDINGS: Right Kidney: Length: 10.3  cm. Echogenicity within normal limits. No mass or hydronephrosis visualized. Left Kidney: Length: 11.2 cm. Echogenicity within normal limits. No mass or hydronephrosis visualized. Bladder: Appears normal for degree of bladder distention. The liver is incidentally noted to be diffusely hyperechoic, incompletely evaluated but suggestive of steatosis. IMPRESSION: 1. Unremarkable appearance of the kidneys. 2. Hepatic steatosis. Electronically Signed   By: Logan Bores M.D.   On: 07/09/2015 12:21   Medications:  I have reviewed the patient's current medications. Prior to Admission:  Prescriptions prior to admission  Medication Sig Dispense Refill Last Dose  . albuterol (PROVENTIL HFA;VENTOLIN HFA) 108 (90 BASE) MCG/ACT inhaler Inhale 4 puffs into the lungs every 6 (six) hours as needed for wheezing or shortness of breath. 1 Inhaler 2 Past Week at Unknown time  . cephALEXin (KEFLEX) 500 MG capsule Take 1 capsule (500 mg total) by mouth 2 (two) times daily. 28 capsule 0 07/07/2015 at Unknown time  . hydrOXYzine (VISTARIL) 100 MG capsule Take 1 capsule (100 mg total) by mouth 3 (three) times daily as needed for anxiety. 30 capsule 0 07/07/2015 at Unknown time  . ibuprofen (ADVIL,MOTRIN) 600 MG tablet Take 1 tablet (600 mg total) by mouth every 8 (eight) hours as needed for moderate pain. 15 tablet 0 07/07/2015 at Unknown time  . methocarbamol (ROBAXIN) 500 MG tablet Take 1 tablet (500 mg total) by mouth every 6 (six) hours as needed for muscle spasms.  13 tablet 0 07/07/2015 at Unknown time  . metroNIDAZOLE (FLAGYL) 500 MG tablet Take 1 tablet (500 mg total) by mouth 2 (two) times daily. 14 tablet 0 07/07/2015 at Unknown time  . omeprazole (PRILOSEC) 40 MG capsule Take 1 capsule (40 mg total) by mouth daily. 30 capsule 0 07/07/2015 at Unknown time   Scheduled Meds: . ciprofloxacin  400 mg Intravenous Q12H  . enoxaparin (LOVENOX) injection  40 mg Subcutaneous Q24H  . metronidazole  500 mg Intravenous Q12H   Continuous Infusions: . sodium chloride 100 mL/hr at 07/10/15 0909   PRN Meds:.albuterol, HYDROmorphone (DILAUDID) injection, ketorolac, promethazine Assessment/Plan: Active Problems:   Pyelonephritis   Hypokalemia   Bacterial vaginosis  Pyelonephritis: Patient continues to have nausea, vomiting and abdominal pain and has been unable to tolerate food by mouth. Renal ultrasound was negative for hydronephrosis or abscess. She has been afebrile for > 48 hours, WBC has trended down from 10.9> 8.1 > 7.1 this morning. UCx from 10/19 grew E. Coli sensitive to Ceftriaxone, but given patient has a history of UTIs resistant to Ceftriaxone, we are treating with ciprofloxacin. UCx and BCx from 10/20 NGTD. I'm not sure why patient hasn't improved symptomatically. I will try to adjust her pain medication.  -Clear liquid diet, ADAT -Continue IV Ciprofloxacin as patient cannot tolerate po -Phenergan 12.5 mg - 25 mg IV Q4H prn  -Increase Dilaudid to 1 mg Q3H prn  -Toradol 30 mg IV Q8H prn  Bacterial Vaginosis: Positive for BV when tested in the ED on 10/19. She was unable to tolerate po medications due to nausea/vomiting. Patient started on Flagyl IV. -Metronidazole 500 mg IV BID (started 10/21; end date: 10/27)  Watery Diarrhea: Patient reports 3 episodes of loose, non-malodorous watery stools overnight. She denies any melena or hematochezia. Doubt C. Diff infection, but given treatment with Ciprofloxacin, there is concern. Will test for C.  Diff. -C. Diff PCR  Hypokalemia: Resolved. Potassium 3.9 this morning.  -Repeat BMET tomorrow am  DVT/PE ppx: Lovenox SQ QD  Dispo: Disposition is deferred at this time,  awaiting improvement of current medical problems.  Anticipated discharge in approximately 1-2 day(s).   The patient does not have a current PCP (No Pcp Per Patient) and does need an [facility] hospital follow-up appointment after discharge.  The patient does not have transportation limitations that hinder transportation to clinic appointments.   LOS: 2 days   Osa Craver, DO PGY-1 Internal Medicine Resident Pager # [phone] 07/10/2015 9:45 AM

## 2015-07-11 LAB — BASIC METABOLIC PANEL
Anion gap: 6 (ref 5–15)
BUN: 8 mg/dL (ref 6–20)
CHLORIDE: 107 mmol/L (ref 101–111)
CO2: 25 mmol/L (ref 22–32)
Calcium: 8.5 mg/dL — ABNORMAL LOW (ref 8.9–10.3)
Creatinine, Ser: 0.72 mg/dL (ref 0.44–1.00)
GFR calc Af Amer: >60 mL/min (ref >60)
GFR calc non Af Amer: >60 mL/min (ref >60)
GLUCOSE: 126 mg/dL — AB (ref 65–99)
POTASSIUM: 3.5 mmol/L (ref 3.5–5.1)
Sodium: 138 mmol/L (ref 135–145)

## 2015-07-11 LAB — HEMOGLOBIN A1C
Hgb A1c MFr Bld: 6 % — ABNORMAL HIGH (ref 4.8–5.6)
Mean Plasma Glucose: 126 mg/dL

## 2015-07-11 MED ORDER — CIPROFLOXACIN HCL 500 MG PO TABS
500.0000 mg | ORAL_TABLET | Freq: Two times a day (BID) | ORAL | Status: DC
  Administered 2015-07-11: 500 mg via ORAL
  Filled 2015-07-11: qty 1

## 2015-07-11 MED ORDER — ONDANSETRON 8 MG PO TBDP
8.0000 mg | ORAL_TABLET | Freq: Once | ORAL | Status: AC
  Administered 2015-07-11: 8 mg via ORAL
  Filled 2015-07-11: qty 1

## 2015-07-11 MED ORDER — PROMETHAZINE HCL 25 MG PO TABS: 25.0000 mg | ORAL_TABLET | ORAL | Status: DC | PRN

## 2015-07-11 MED ORDER — ONDANSETRON 8 MG PO TBDP: 8.0000 mg | ORAL_TABLET | Freq: Three times a day (TID) | ORAL | Status: DC | PRN

## 2015-07-11 MED ORDER — OXYCODONE HCL 5 MG PO TABS: 5.0000 mg | ORAL_TABLET | ORAL | Status: DC | PRN

## 2015-07-11 MED ORDER — OXYCODONE HCL 5 MG PO TABS
5.0000 mg | ORAL_TABLET | ORAL | Status: DC | PRN
Start: 2015-07-11 — End: 2015-08-15

## 2015-07-11 MED ORDER — ACETAMINOPHEN 325 MG PO TABS: 650.0000 mg | ORAL_TABLET | Freq: Four times a day (QID) | ORAL | Status: DC | PRN

## 2015-07-11 MED ORDER — CIPROFLOXACIN HCL 500 MG PO TABS: 500.0000 mg | ORAL_TABLET | Freq: Two times a day (BID) | ORAL | Status: DC

## 2015-07-11 MED ORDER — OXYCODONE-ACETAMINOPHEN 5-325 MG PO TABS: 1.0000 | ORAL_TABLET | Freq: Four times a day (QID) | ORAL | Status: DC | PRN

## 2015-07-11 MED ORDER — HYDROMORPHONE HCL 1 MG/ML IJ SOLN: 0.5000 mg | INTRAMUSCULAR | Status: DC | PRN

## 2015-07-11 NOTE — Care Management Note (Signed)
Case Management Note  Patient Details  Name: Lorraine [name] MRN: [id number] Date of Birth: 1986/06/07  Subjective/Objective:          Patient for dc today, patient will go to [facility] to get meds, cipro, and zofran which is on the $4 list.  No other needs.          Action/Plan:   Expected Discharge Date:                  Expected Discharge Plan:  Home/Self Care  In-House Referral:     Discharge planning Services  CM Consult, [facility], Medication Assistance, Follow-up appt scheduled  Post Acute Care Choice:    Choice offered to:     DME Arranged:    DME Agency:     HH Arranged:    [place] Agency:     Status of Service:  Completed, signed off  Medicare Important Message Given:    Date Medicare IM Given:    Medicare IM give by:    Date Additional Medicare IM Given:    Additional Medicare Important Message give by:     If discussed at [place] of Stay Meetings, dates discussed:    Additional Comments:  Zenon Mayo, RN 07/11/2015, 3:36 PM

## 2015-07-11 NOTE — Discharge Instructions (Signed)
°  TOMA CIPROFLOXACIN 500 MG (UNA PASTILLA) DOS VECES CADA DIA PARA CINCO DIAS MAS  TOMA ZOFRAN PARA NAUSEA   TOMA OXYCODONE PARA DOLOR

## 2015-07-11 NOTE — Discharge Summary (Signed)
Name: Lorraine [name] MRN: [id number] DOB: 05/19/1986 29 y.o. PCP: No Pcp Per Patient  Date of Admission: 07/07/2015  9:30 PM Date of Discharge: 07/11/2015 Attending Physician: Sid Falcon, MD  Discharge Diagnosis: 1. Pyelonephritis 2. Bacterial vaginosis  Discharge Medications:   Medication List    STOP taking these medications        cephALEXin 500 MG capsule  Commonly known as:  KEFLEX     metroNIDAZOLE 500 MG tablet  Commonly known as:  FLAGYL      TAKE these medications        albuterol 108 (90 BASE) MCG/ACT inhaler  Commonly known as:  PROVENTIL HFA;VENTOLIN HFA  Inhale 4 puffs into the lungs every 6 (six) hours as needed for wheezing or shortness of breath.     ciprofloxacin 500 MG tablet  Commonly known as:  CIPRO  Take 1 tablet (500 mg total) by mouth 2 (two) times daily.     hydrOXYzine 100 MG capsule  Commonly known as:  VISTARIL  Take 1 capsule (100 mg total) by mouth 3 (three) times daily as needed for anxiety.     ibuprofen 600 MG tablet  Commonly known as:  ADVIL,MOTRIN  Take 1 tablet (600 mg total) by mouth every 8 (eight) hours as needed for moderate pain.     methocarbamol 500 MG tablet  Commonly known as:  ROBAXIN  Take 1 tablet (500 mg total) by mouth every 6 (six) hours as needed for muscle spasms.     omeprazole 40 MG capsule  Commonly known as:  PRILOSEC  Take 1 capsule (40 mg total) by mouth daily.     ondansetron 8 MG disintegrating tablet  Commonly known as:  ZOFRAN-ODT  Take 1 tablet (8 mg total) by mouth every 8 (eight) hours as needed for nausea or vomiting.     oxyCODONE 5 MG immediate release tablet  Commonly known as:  Oxy IR/ROXICODONE  Take 1 tablet (5 mg total) by mouth every 4 (four) hours as needed for severe pain or breakthrough pain.        Disposition and follow-up:   [name] was discharged from [facility] in Good condition.  At the hospital follow up visit please  address:  Resolution of her pyelonephritis and bacterial vaginosis  Follow-up Appointments:     Follow-up Information    Follow up with [facility] On 07/15/2015.   Why:  9:30 for hospital follow up   Contact information:   [address] [place] [address] [phone]      Procedures Performed:  Dg Abd 1 View  07/08/2015  CLINICAL DATA:  Abdominal pain, vomiting, fever, and chills. EXAM: ABDOMEN - 1 VIEW COMPARISON:  None. FINDINGS: No evidence of dilated bowel loops. Scattered air-fluid walls are seen within the transverse portion of colon. This is nonspecific but can be seen with enterocolitis. Right upper quadrant surgical clips seen as well as clips from previous bilateral tubal ligation. IMPRESSION: Nonobstructive bowel gas pattern. Colonic air-fluid levels are nonspecific but can be seen with enterocolitis. Electronically Signed   By: Earle Gell M.D.   On: 07/08/2015 13:25   US Renal  07/09/2015  CLINICAL DATA:  Pyelonephritis.  Right flank pain for 1 week. EXAM: RENAL / URINARY TRACT ULTRASOUND COMPLETE COMPARISON:  CT abdomen and pelvis 05/18/2015 FINDINGS: Right Kidney: Length: 10.3 cm. Echogenicity within normal limits. No mass or hydronephrosis visualized. Left Kidney: Length: 11.2 cm. Echogenicity within normal  limits. No mass or hydronephrosis visualized. Bladder: Appears normal for degree of bladder distention. The liver is incidentally noted to be diffusely hyperechoic, incompletely evaluated but suggestive of steatosis. IMPRESSION: 1. Unremarkable appearance of the kidneys. 2. Hepatic steatosis. Electronically Signed   By: Logan Bores M.D.   On: 07/09/2015 12:21    Admission HPI:  Patient is a 29 year old woman with a PMH of asthma who presents with flank pain, fever, and chills. Patient was Spanish-speaking, so history was obtained primarily by Dr. Posey Pronto. This all started one week ago, when she had pain and burning,  urination, some flank pain, and white discharge. She had gone to the ED on 10/19, and was found to have UA consistent with a UTI, and a wet prep with clue cells. She was given IV ceftriaxone and prescribed Keflex and Metronidazole. However, she developed fever, nausea and vomiting 10/20, was unable to keep any of the pills down, and they decided to return to the ED. She has chest pain, but says it is reproducible with palpation. She has felt like her heart was racing we well. She endorses a dry cough, but no shortness of breath, rarely needing to use her inhaler. She denies any headache, blackouts, skin changes, sore throat, new weakness, or vision changes. She had a previous episode of pyelonephritis in February 2016, For which she received IV ceftriaxone and was discharged on Ciprofloxacin. Urine cultures eventually grew E coli that was resistant to ceftriaxone by sensitive for ciprofloxacin. She has had three C sections, with her most recent birth in December 2015. She has had a tubal ligation, and says she had a "bladder surgery" in 2010, but cannot provide any more details. In the ED, she had mild leukocytosis to 10.9 and UA showing positive more small leuks only. Blood and urine cultures were drawn. She was given a dose of ceftriaxone.   Hospital Course by problem list:   1. Pyelonephritis: She presented with left flank pain and dysuria after not being able to take her antibiotics due to intractable nausea. She was given IV ciprofloxacin because a prior E coli UTI was Ceftriaxone-resistant. She pain improved slowly but her nausea persisted. On 10/23, a renal ultrasound was negative for abscess. Her nausea improved and she was able to tolerate oral intake, so she was discharged home with oral ciprofloxacin for a total of 10 day course with sublingual Zofran.  2. Bacterial vaginosis: She was originally treated with metronidazole, but there was concern this may be contributing to her nausea, so she was  changed to oral clindamycin and completed a 5 day course.  Discharge Vitals:   BP 98/60 mmHg  Pulse 85  Temp(Src) 98.3 F (36.8 C) (Oral)  Resp 18  Ht 5' (1.524 m)  Wt 86.637 kg (191 lb)  BMI 37.30 kg/m2  SpO2 97%  LMP 06/23/2015  Discharge Labs:  Results for orders placed or performed during the hospital encounter of 07/07/15 (from the past 24 hour(s))  C difficile quick scan w PCR reflex     Status: None   Collection Time: 07/10/15  6:20 PM  Result Value Ref Range   C Diff antigen NEGATIVE NEGATIVE   C Diff toxin NEGATIVE NEGATIVE   C Diff interpretation Negative for toxigenic C. difficile   Basic metabolic panel     Status: Abnormal   Collection Time: 07/11/15  5:27 AM  Result Value Ref Range   Sodium 138 135 - 145 mmol/L   Potassium 3.5 3.5 - 5.1  mmol/L   Chloride 107 101 - 111 mmol/L   CO2 25 22 - 32 mmol/L   Glucose, Bld 126 (H) 65 - 99 mg/dL   BUN 8 6 - 20 mg/dL   Creatinine, Ser 0.72 0.44 - 1.00 mg/dL   Calcium 8.5 (L) 8.9 - 10.3 mg/dL   GFR calc non Af Amer >60 >60 mL/min   GFR calc Af Amer >60 >60 mL/min   Anion gap 6 5 - 15   Signed: [name], MD 07/11/2015, 12:33 PM

## 2015-07-11 NOTE — Progress Notes (Signed)
Interpreter Lesle Chris   for [employer]

## 2015-07-11 NOTE — Progress Notes (Signed)
Patient ID: Lorraine [name], female   DOB: 05-20-1986, 29 y.o.   MRN: [id number]   Subjective: Lorraine [name] was feeling much better this morning and was ready to go home. Her flank pain is much better but she's still having some nausea after eating. The dissolving Zofran helped quite a bit.  Objective: Vital signs in last 24 hours: Filed Vitals:   07/10/15 0520 07/10/15 1403 07/10/15 2228 07/11/15 0516  BP: 106/55 93/51 117/73 98/60  Pulse: 80 62 85 85  Temp: 98.3 F (36.8 C) 98.3 F (36.8 C) 98.2 F (36.8 C) 98.3 F (36.8 C)  TempSrc: Oral Oral Oral Oral  Resp: 18 19 18 18   Height:      Weight:      SpO2: 100% 100% 98% 97%   General: resting in bed HEENT: no scleral icterus. EOMI Cardiac: RRR, no rubs, murmurs or gallops Pulm: breathing well CTAB Abd: BS normoactive, slightly tender in right lower quadrant, but improved from prior exam Ext: warm and well perfused, no pedal edema Skin: no rash Neuro: alert and oriented X3, cranial nerves II-XII grossly intact  Lab Results: Basic Metabolic Panel:  Recent Labs Lab 07/08/15 0517 07/09/15 0437 07/10/15 0346 07/11/15 0527  NA 137 134* 138 138  K 3.3* 3.2* 3.9 3.5  CL 104 99* 103 107  CO2 24 28 25 25   GLUCOSE 121* 102* 100* 126*  BUN 10 5* 7 8  CREATININE 0.86 0.85 0.80 0.72  CALCIUM 8.6* 8.6* 8.9 8.5*  MG 1.9 1.9  --   --    C. Diff negative  Medications: I have reviewed the patient's current medications. Scheduled Meds: . ciprofloxacin  500 mg Oral BID  . enoxaparin (LOVENOX) injection  40 mg Subcutaneous Q24H   Continuous Infusions: . sodium chloride 100 mL/hr at 07/11/15 0853   PRN Meds:.acetaminophen, albuterol, HYDROmorphone (DILAUDID) injection, oxyCODONE, promethazine  Assessment/Plan:  Pyelonephritis: Clinically improving and she's tolerating oral antibiotics. -Continue oral ciprofloxacin (stop date 10/31) -Acetaminophen as needed for pain  Nausea and vomiting: Improved and eating normal  diet now. I stopped her metronidazole because I was afraid this was contributing to her nausea. -Will discharge with sublingual Zofran  Bacterial vaginosis: Not contributing to pyelo but we'll treat with Clindamycin -Start clindamycin (stop date 10/28)  Dispo: Discharge today.  The patient does not have a current PCP (No Pcp Per Patient) and does not need an [facility] hospital follow-up appointment after discharge.  The patient does not know have transportation limitations that hinder transportation to clinic appointments.  .Services Needed at time of discharge: Y = Yes, Blank = No PT:   OT:   RN:   Equipment:   Other:     LOS: 3 days   [name], MD 07/11/2015, 12:12 PM

## 2015-07-11 NOTE — Progress Notes (Addendum)
Lorraine [name] to be D/C'd Home per MD order.  Discussed with the patient and all questions fully answered.  VSS, Skin clean, dry and intact without evidence of skin break down, no evidence of skin tears noted. IV catheter discontinued intact. Site without signs and symptoms of complications. Dressing and pressure applied.  An After Visit Summary was printed and given to the patient. Patient received prescription.  D/c education completed with patient/family including follow up instructions, medication list, d/c activities limitations if indicated, with other d/c instructions as indicated by MD - patient able to verbalize understanding, all questions fully answered.   Patient instructed to return to ED, call 911, or call MD for any changes in condition.   Patient escorted via [place], and D/C home via private auto.    Paged Spanish interpreter to help with discharge instructions  Malcolm Metro, RN 07/11/2015 1:08 PM

## 2015-07-13 LAB — CULTURE, BLOOD (ROUTINE X 2)
Culture: NO GROWTH
Culture: NO GROWTH

## 2015-07-14 ENCOUNTER — Other Ambulatory Visit: Payer: Self-pay | Admitting: Internal Medicine

## 2015-07-15 ENCOUNTER — Encounter: Payer: Self-pay | Admitting: Family Medicine

## 2015-07-15 ENCOUNTER — Ambulatory Visit: Payer: MEDICAID | Attending: Family Medicine | Admitting: Family Medicine

## 2015-07-15 VITALS — BP 117/78 | HR 71 | Temp 97.8°F | Resp 16 | Ht 60.0 in | Wt 194.0 lb

## 2015-07-15 DIAGNOSIS — N12 Tubulo-interstitial nephritis, not specified as acute or chronic: Secondary | ICD-10-CM

## 2015-07-15 DIAGNOSIS — R7309 Other abnormal glucose: Secondary | ICD-10-CM | POA: Insufficient documentation

## 2015-07-15 DIAGNOSIS — R7303 Prediabetes: Secondary | ICD-10-CM

## 2015-07-15 DIAGNOSIS — N10 Acute tubulo-interstitial nephritis: Secondary | ICD-10-CM | POA: Insufficient documentation

## 2015-07-15 LAB — POCT URINALYSIS DIPSTICK
BILIRUBIN UA: NEGATIVE
Blood, UA: NEGATIVE
Glucose, UA: NEGATIVE
KETONES UA: NEGATIVE
LEUKOCYTES UA: NEGATIVE
Nitrite, UA: NEGATIVE
PROTEIN UA: NEGATIVE
Spec Grav, UA: 1.02
Urobilinogen, UA: 0.2
pH, UA: 6

## 2015-07-15 NOTE — Progress Notes (Signed)
  Pt's here for HFU - Pyelonephritis pt rates pain in lower back rated at 3/10.   Pt describes pain as constant aching pain in her lower back.

## 2015-07-15 NOTE — Progress Notes (Signed)
CC: Follow-up from hospitalization (07/07/15-07/11/15)  HPI: Lorraine [name] is a 29 y.o. female who was recently hospitalized at [facility] ED from 07/07/15-07/11/15 for acute pyelonephritis.  She had previously presented to [facility] ED with dysuria, flank pain, vaginal discharge for which she received IV ceftriaxone, Keflex and metronidazole for subsequently developed fever, nausea and vomiting the next day for which she represented to the ED again. On presentation her WBC was 10.9, UA was positive for small leukocytes. She was placed on IV ciprofloxacin (due to previous urine culture which grew Escherichia coli resistant to ceftriaxone). She was placed on clindamycin for bacterial vaginosis (as nausea was thought to be secondary to metronidazole). Urine culture and blood culture were no growth to date. Renal ultrasound was unremarkable except for hepatic steatosis. Her condition improved and she was subsequently discharged with oral ciprofloxacin.  Interval history: She still complains of persisting dysuria and mild bilateral flank pain but denies nausea, vomiting or fever. Patient has No headache, No chest pain, No abdominal pain - No Nausea, No new weakness tingling or numbness, No Cough - SOB.  No Known Allergies Past Medical History  Diagnosis Date  . Diverticulitis   . Pyelonephritis   . Pyelonephritis   . Asthma    Current Outpatient Prescriptions on File Prior to Visit  Medication Sig Dispense Refill  . albuterol (PROVENTIL HFA;VENTOLIN HFA) 108 (90 BASE) MCG/ACT inhaler Inhale 4 puffs into the lungs every 6 (six) hours as needed for wheezing or shortness of breath. 1 Inhaler 2  . ciprofloxacin (CIPRO) 500 MG tablet Take 1 tablet (500 mg total) by mouth 2 (two) times daily. 10 tablet 0  . hydrOXYzine (VISTARIL) 100 MG capsule Take 1 capsule (100 mg total) by mouth 3 (three) times daily as needed for anxiety. 30 capsule 0  . ibuprofen (ADVIL,MOTRIN) 600 MG tablet Take 1  tablet (600 mg total) by mouth every 8 (eight) hours as needed for moderate pain. 15 tablet 0  . methocarbamol (ROBAXIN) 500 MG tablet Take 1 tablet (500 mg total) by mouth every 6 (six) hours as needed for muscle spasms. 13 tablet 0  . omeprazole (PRILOSEC) 40 MG capsule Take 1 capsule (40 mg total) by mouth daily. 30 capsule 0  . ondansetron (ZOFRAN-ODT) 8 MG disintegrating tablet Take 1 tablet (8 mg total) by mouth every 8 (eight) hours as needed for nausea or vomiting. 20 tablet 0  . oxyCODONE (OXY IR/ROXICODONE) 5 MG immediate release tablet Take 1 tablet (5 mg total) by mouth every 4 (four) hours as needed for severe pain or breakthrough pain. 10 tablet 0   No current facility-administered medications on file prior to visit.   Family History  Problem Relation Age of Onset  . Hyperlipidemia Mother   . Diabetes Maternal Uncle   . Diabetes Paternal Grandmother    Social History   Social History  . Marital Status: Married    Spouse Name: N/A  . Number of Children: N/A  . Years of Education: N/A   Occupational History  . Not on file.   Social History Main Topics  . Smoking status: Never Smoker   . Smokeless tobacco: Never Used  . Alcohol Use: No  . Drug Use: No  . Sexual Activity: Yes    Birth Control/ Protection: Surgical, None   Other Topics Concern  . Not on file   Social History Narrative   ** Merged History Encounter **        Review of Systems: Constitutional: Negative  for fever, chills, diaphoresis, activity change, appetite change and fatigue. HENT: Negative for ear pain, nosebleeds, congestion, facial swelling, rhinorrhea, neck pain, neck stiffness and ear discharge.  Eyes: Negative for pain, discharge, redness, itching and visual disturbance. Respiratory: Negative for cough, choking, chest tightness, shortness of breath, wheezing and stridor.  Cardiovascular: Negative for chest pain, palpitations and leg swelling. Gastrointestinal: Negative for abdominal  distention. Genitourinary: Positive for dysuria  Musculoskeletal: Bilateral flank pain, negative for joint swelling, arthralgias and gait problem Neurological: Negative for dizziness, tremors, seizures, syncope, facial asymmetry, speech difficulty, weakness, light-headedness, numbness and headaches.  Hematological: Negative for adenopathy. Does not bruise/bleed easily. Psychiatric/Behavioral: Negative for hallucinations, behavioral problems, confusion, dysphoric mood, decreased concentration and agitation.    Objective:   Filed Vitals:   07/15/15 0922  BP: 117/78  Pulse: 71  Temp: 97.8 F (36.6 C)  Resp: 16    Physical Exam: Constitutional: Patient appears well-developed and well-nourished. No distress. HENT: Normocephalic, atraumatic, External right and left ear normal. Oropharynx is clear and moist.  Eyes: Conjunctivae and EOM are normal. PERRLA, no scleral icterus. Neck: Normal ROM. Neck supple. No JVD. No tracheal deviation. No thyromegaly. CVS: RRR, S1/S2 +, no murmurs, no gallops, no carotid bruit.  Pulmonary: Effort and breath sounds normal, no stridor, rhonchi, wheezes, rales.  Abdominal: Soft. BS +,  no distension, tenderness, rebound or guarding.  Musculoskeletal: Normal range of motion. Positive CVA tenderness bilaterally.  Lymphadenopathy: No lymphadenopathy noted, cervical, inguinal or axillary Neuro: Alert. Normal reflexes, muscle tone coordination. No cranial nerve deficit. Skin: Skin is warm and dry. No rash noted. Not diaphoretic. No erythema. No pallor. Psychiatric: Normal mood and affect. Behavior, judgment, thought content normal.  Lab Results  Component Value Date   WBC 7.2 07/10/2015   HGB 12.5 07/10/2015   HCT 37.6 07/10/2015   MCV 87.0 07/10/2015   PLT 281 07/10/2015   Lab Results  Component Value Date   CREATININE 0.72 07/11/2015   BUN 8 07/11/2015   NA 138 07/11/2015   K 3.5 07/11/2015   CL 107 07/11/2015   CO2 25 07/11/2015    Lab Results    Component Value Date   HGBA1C 6.0* 07/09/2015      Assessment and plan:  Acute pyelonephritis: Completed course of ciprofloxacin. Due to persisting symptoms I have sent off a UA which is negative in the clinic but will also send a urine culture. Return precautions have been discussed.  Prediabetes: Labs revealed A1c of 6.0. Provided education and lifestyle management including dietary and exercise regimens to prevent development of diabetes.  This note has been created with Surveyor, quantity. Any transcriptional errors are unintentional.         Arnoldo Morale, MD. [facility] [phone] 07/15/2015, 12:40 PM

## 2015-07-16 LAB — URINE CULTURE

## 2015-07-21 ENCOUNTER — Telehealth: Payer: Self-pay

## 2015-07-21 NOTE — Telephone Encounter (Signed)
-----   Message from Arnoldo Morale, MD sent at 07/18/2015 11:26 AM EDT ----- Urine culture result does not suggest retreating her previously treated UTI; continue fluids and cranberry juice.

## 2015-07-21 NOTE — Telephone Encounter (Signed)
Pt returned my call. I spoke with [facility] interpreter [phone]. Interpreter gave pt her lab results and pt verbalized that she understood with no further questions.

## 2015-07-21 NOTE — Telephone Encounter (Signed)
CMA called [employer], spoke to [place]. Cory Roughen left a message for pt to return my call asap.

## 2015-08-15 ENCOUNTER — Emergency Department (HOSPITAL_COMMUNITY)
Admission: EM | Admit: 2015-08-15 | Discharge: 2015-08-15 | Disposition: A | Discharge status: Home / Self Care | Payer: Self-pay | Attending: Emergency Medicine | Admitting: Emergency Medicine

## 2015-08-15 ENCOUNTER — Encounter (HOSPITAL_COMMUNITY): Payer: Self-pay | Admitting: Family Medicine

## 2015-08-15 DIAGNOSIS — Z8719 Personal history of other diseases of the digestive system: Secondary | ICD-10-CM | POA: Insufficient documentation

## 2015-08-15 DIAGNOSIS — M545 Low back pain: Secondary | ICD-10-CM | POA: Insufficient documentation

## 2015-08-15 DIAGNOSIS — Z87448 Personal history of other diseases of urinary system: Secondary | ICD-10-CM | POA: Insufficient documentation

## 2015-08-15 DIAGNOSIS — N390 Urinary tract infection, site not specified: Secondary | ICD-10-CM | POA: Insufficient documentation

## 2015-08-15 DIAGNOSIS — J45909 Unspecified asthma, uncomplicated: Secondary | ICD-10-CM | POA: Insufficient documentation

## 2015-08-15 LAB — URINALYSIS, ROUTINE W REFLEX MICROSCOPIC
BILIRUBIN URINE: NEGATIVE
GLUCOSE, UA: NEGATIVE mg/dL
HGB URINE DIPSTICK: NEGATIVE
KETONES UR: NEGATIVE mg/dL
NITRITE: NEGATIVE
PROTEIN: NEGATIVE mg/dL
Specific Gravity, Urine: 1.011 (ref 1.005–1.030)
pH: 6 (ref 5.0–8.0)

## 2015-08-15 LAB — CBC WITH DIFFERENTIAL/PLATELET
BASOS ABS: 0 10*3/uL (ref 0.0–0.1)
BASOS PCT: 0 %
Eosinophils Absolute: 0.3 10*3/uL (ref 0.0–0.7)
Eosinophils Relative: 3 %
HEMATOCRIT: 36.1 % (ref 36.0–46.0)
HEMOGLOBIN: 12 g/dL (ref 12.0–15.0)
LYMPHS PCT: 33 %
Lymphs Abs: 2.7 10*3/uL (ref 0.7–4.0)
MCH: 29.7 pg (ref 26.0–34.0)
MCHC: 33.2 g/dL (ref 30.0–36.0)
MCV: 89.4 fL (ref 78.0–100.0)
MONOS PCT: 6 %
Monocytes Absolute: 0.5 10*3/uL (ref 0.1–1.0)
NEUTROS ABS: 4.8 10*3/uL (ref 1.7–7.7)
NEUTROS PCT: 58 %
Platelets: 283 10*3/uL (ref 150–400)
RBC: 4.04 MIL/uL (ref 3.87–5.11)
RDW: 13 % (ref 11.5–15.5)
WBC: 8.2 10*3/uL (ref 4.0–10.5)

## 2015-08-15 LAB — URINE MICROSCOPIC-ADD ON

## 2015-08-15 LAB — BASIC METABOLIC PANEL
ANION GAP: 7 (ref 5–15)
BUN: 7 mg/dL (ref 6–20)
CALCIUM: 9.2 mg/dL (ref 8.9–10.3)
CHLORIDE: 109 mmol/L (ref 101–111)
CO2: 25 mmol/L (ref 22–32)
Creatinine, Ser: 0.74 mg/dL (ref 0.44–1.00)
GFR calc non Af Amer: >60 mL/min (ref >60)
Glucose, Bld: 108 mg/dL — ABNORMAL HIGH (ref 65–99)
POTASSIUM: 3.5 mmol/L (ref 3.5–5.1)
Sodium: 141 mmol/L (ref 135–145)

## 2015-08-15 LAB — I-STAT BETA HCG BLOOD, ED (MC, WL, AP ONLY)

## 2015-08-15 MED ORDER — CEPHALEXIN 500 MG PO CAPS: 500.0000 mg | ORAL_CAPSULE | Freq: Four times a day (QID) | ORAL | Status: DC

## 2015-08-15 MED ORDER — KETOROLAC TROMETHAMINE 30 MG/ML IJ SOLN
30.0000 mg | Freq: Once | INTRAMUSCULAR | Status: AC
  Administered 2015-08-15: 30 mg via INTRAVENOUS
  Filled 2015-08-15: qty 1

## 2015-08-15 MED ORDER — SODIUM CHLORIDE 0.9 % IV BOLUS (SEPSIS)
1000.0000 mL | Freq: Once | INTRAVENOUS | Status: AC
  Administered 2015-08-15: 1000 mL via INTRAVENOUS

## 2015-08-15 MED ORDER — ONDANSETRON HCL 4 MG/2ML IJ SOLN
4.0000 mg | Freq: Once | INTRAMUSCULAR | Status: AC
  Administered 2015-08-15: 4 mg via INTRAVENOUS
  Filled 2015-08-15: qty 2

## 2015-08-15 NOTE — ED Notes (Signed)
Attempted to call for a Spanish interpretor with no success, will retry.

## 2015-08-15 NOTE — ED Provider Notes (Signed)
CSN: [id number]     Arrival date & time 08/15/15  [id number] History   First MD Initiated Contact with Patient 08/15/15 [phone]     Chief Complaint  Patient presents with  . Flank Pain     (Consider location/radiation/quality/duration/timing/severity/associated sxs/prior Treatment) HPI   Lorraine [name] is a 29 y.o. female presents for evaluation of right flank pain, radiating to the right abdomen, present for 1 day. She has dysuria and urinary frequency without hematuria. She denies fever, chills, nausea, vomiting, weakness or dizziness. She was hospitalized about 2 months ago with pyelonephritis. She states that the treatment given at that time improved her symptoms. She's not followed up with a primary care doctor yet because she does not have one. There are no other known modifying factors.   Past Medical History  Diagnosis Date  . Diverticulitis   . Pyelonephritis   . Pyelonephritis   . Asthma    Past Surgical History  Procedure Laterality Date  . Cholecystectomy    . Cesarean section    . Cesarean section N/A 2006  . Cesarean section N/A 09/03/2014    Procedure: CESAREAN SECTION;  Surgeon: Guss Bunde, MD;  Location: [place];  Service: Obstetrics;  Laterality: N/A;  . Tubal ligation     Family History  Problem Relation Age of Onset  . Hyperlipidemia Mother   . Diabetes Maternal Uncle   . Diabetes Paternal Grandmother    Social History  Substance Use Topics  . Smoking status: Never Smoker   . Smokeless tobacco: Never Used  . Alcohol Use: No   OB History    This patient's OB History needs to be verified. Open OB History to review and resolve any issues.   Gravida Para Term Preterm AB TAB SAB Ectopic Multiple Living   5 3 1 2 2  2  1 2      Review of Systems  All other systems reviewed and are negative.     Allergies  Review of patient's allergies indicates no known allergies.  Home Medications   Prior to Admission medications   Medication Sig Start Date  End Date Taking? Authorizing Provider  albuterol (PROVENTIL HFA;VENTOLIN HFA) 108 (90 BASE) MCG/ACT inhaler Inhale 4 puffs into the lungs every 6 (six) hours as needed for wheezing or shortness of breath. 08/13/14  Yes Mariel Aloe, MD   BP 105/51 mmHg  Pulse 68  Temp(Src) 98.1 F (36.7 C)  Resp 18  SpO2 99% Physical Exam  Constitutional: She is oriented to person, place, and time. She appears well-developed and well-nourished.  HENT:  Head: Normocephalic and atraumatic.  Right Ear: External ear normal.  Left Ear: External ear normal.  Eyes: Conjunctivae and EOM are normal. Pupils are equal, round, and reactive to light.  Neck: Normal range of motion and phonation normal. Neck supple.  Cardiovascular: Normal rate, regular rhythm and normal heart sounds.   Pulmonary/Chest: Effort normal and breath sounds normal. She exhibits no bony tenderness.  Abdominal: Soft. She exhibits no distension. There is no tenderness. There is no guarding.  Genitourinary:  Mild right costovertebral angle tenderness with percussion.  Musculoskeletal: Normal range of motion.  Mild right lower lumbar tenderness.  Neurological: She is alert and oriented to person, place, and time. No cranial nerve deficit or sensory deficit. She exhibits normal muscle tone. Coordination normal.  Skin: Skin is warm, dry and intact.  Psychiatric: She has a normal mood and affect. Her behavior is normal. Judgment and thought content normal.  Nursing  note and vitals reviewed.   ED Course  Procedures (including critical care time)  Medications  sodium chloride 0.9 % bolus 1,000 mL (0 mLs Intravenous Stopped 08/15/15 1146)  ondansetron (ZOFRAN) injection 4 mg (4 mg Intravenous Given 08/15/15 1206)  ketorolac (TORADOL) 30 MG/ML injection 30 mg (30 mg Intravenous Given 08/15/15 1206)    Patient Vitals for the past 24 hrs:  BP Temp Temp src Pulse Resp SpO2  08/15/15 1439 (!) 108/50 mmHg - - - - -  08/15/15 1437 - 97.8 F  (36.6 C) Oral - 16 -  08/15/15 1431 - - - 76 - 98 %  08/15/15 1330 (!) 105/51 mmHg - - 68 - 99 %  08/15/15 1315 101/65 mmHg - - (!) 56 - 99 %  08/15/15 1230 98/64 mmHg - - 61 - 98 %  08/15/15 1200 115/76 mmHg - - 64 - 99 %  08/15/15 1145 (!) 106/49 mmHg - - 76 - 99 %  08/15/15 1130 104/68 mmHg - - 67 - 99 %  08/15/15 1100 104/75 mmHg - - 90 - 98 %  08/15/15 1045 103/65 mmHg - - 62 - 99 %  08/15/15 1030 103/56 mmHg - - 80 - 99 %  08/15/15 1015 106/62 mmHg - - 77 - 99 %  08/15/15 1000 103/55 mmHg - - 72 - 99 %  08/15/15 0945 118/76 mmHg 98.1 F (36.7 C) - 73 18 98 %    2:40 PM Reevaluation with update and discussion. After initial assessment and treatment, an updated evaluation reveals patient remains fairly comfortable. Findings discussed with patient and husband, all questions were answered. Lorraine [name]    Labs Review Labs Reviewed  BASIC METABOLIC PANEL - Abnormal; Notable for the following:    Glucose, Bld 108 (*)    All other components within normal limits  URINALYSIS, ROUTINE W REFLEX MICROSCOPIC (NOT AT [facility]) - Abnormal; Notable for the following:    APPearance HAZY (*)    Leukocytes, UA SMALL (*)    All other components within normal limits  URINE MICROSCOPIC-ADD ON - Abnormal; Notable for the following:    Squamous Epithelial / LPF 0-5 (*)    Bacteria, UA RARE (*)    All other components within normal limits  URINE CULTURE  CBC WITH DIFFERENTIAL/PLATELET  I-STAT BETA HCG BLOOD, ED ([facility], [facility], [facility] ONLY)    Imaging Review No results found. I have personally reviewed and evaluated these images and lab results as part of my medical decision-making.   EKG Interpretation None      MDM   Final diagnoses:  Urinary tract infection without hematuria, site unspecified     UTI without complicating features. Doubt Kidney stone or Pyelonephritis.  Nursing Notes Reviewed/ Care Coordinated Applicable Imaging Reviewed Interpretation of Laboratory Data incorporated  into ED treatment  The patient appears reasonably screened and/or stabilized for discharge and I doubt any other medical condition or other [facility] requiring further screening, evaluation, or treatment in the ED at this time prior to discharge.  Plan: Home Medications- Keflex; Home Treatments- Rest, fluids; return here if the recommended treatment, does not improve the symptoms; Recommended follow up- PCP 1-2 weeks, Resources given   Daleen Bo, MD 08/15/15 2027

## 2015-08-15 NOTE — ED Notes (Signed)
Pt here for right flank pain and vomiting that started this am,

## 2015-08-15 NOTE — ED Notes (Signed)
Attempted to call for Spanish interpretor again with no success. Wellington Hampshire, Charge RN notified.

## 2015-08-15 NOTE — Discharge Instructions (Signed)
Infeccin urinaria  (Urinary Tract Infection)  La infeccin urinaria puede ocurrir en Clinical cytogeneticist del tracto urinario. El tracto urinario es un sistema de drenaje del cuerpo por el que se eliminan los desechos y el exceso de [place]. El tracto urinario est formado por dos riones, dos urteres, la vejiga y Geologist, engineering. Los riones son rganos que tienen forma de frijol. Cada rin tiene aproximadamente el tamao del puo. Estn situados debajo de las [place], uno a cada lado de la columna vertebral CAUSAS  La causa de la infeccin son los microbios, que son organismos microscpicos, que incluyen hongos, virus, y bacterias. Estos organismos son tan pequeos que slo pueden verse a travs del microscopio. Las bacterias son los microorganismos que ms comnmente causan infecciones urinarias.  SNTOMAS  Los sntomas pueden variar segn la edad y el sexo del paciente y por la ubicacin de la infeccin. Los sntomas en las mujeres jvenes incluyen la necesidad frecuente e intensa de orinar y una sensacin dolorosa de ardor en la vejiga o en la uretra durante la miccin. Las mujeres y los hombres mayores podrn sentir cansancio, temblores y debilidad y Arts development officer musculares y Social research officer, government abdominal. Si tiene [place], puede significar que la infeccin est en los riones. Otros sntomas son dolor en la espalda o en los lados debajo de las [place], nuseas y vmitos.  DIAGNSTICO  Para diagnosticar una infeccin urinaria, el mdico le preguntar acerca de sus sntomas. [employer] Mutual una [place] de [place]. La muestra de orina se analiza para Hydrographic surveyor bacterias y glbulos blancos de Herbalist. Los glbulos blancos se forman en el organismo para ayudar a Radio broadcast assistant las infecciones.  TRATAMIENTO  Por lo general, las infecciones urinarias pueden tratarse con medicamentos. Debido a que la State Farm de las infecciones son causadas por bacterias, por lo general pueden tratarse con antibiticos. La eleccin del  antibitico y la duracin del tratamiento depender de sus sntomas y el tipo de bacteria causante de la infeccin.  INSTRUCCIONES PARA EL CUIDADO EN EL HOGAR   Si le recetaron antibiticos, tmelos exactamente como su mdico le indique. Termine el medicamento aunque se sienta mejor despus de haber tomado slo algunos.  Beba gran cantidad de lquido para mantener la orina de tono claro o color amarillo plido.  Evite la cafena, el t y las bebidas gaseosas. Estas sustancias irritan la vejiga.  Vaciar la vejiga con frecuencia. Evite retener la orina durante largos perodos.  Vace la vejiga antes y despus de Clinical biochemist.  Despus de mover el intestino, las mujeres deben higienizarse la regin perineal desde adelante hacia atrs. Use slo un papel tissue por vez. SOLICITE ATENCIN MDICA SI:   Siente dolor en la espalda.  Le sube la fiebre.  Los sntomas no mejoran luego de 3 das. SOLICITE ATENCIN MDICA DE INMEDIATO SI:   Siente dolor intenso en la espalda o en la zona inferior del abdomen.  Comienza a sentir escalofros.  Tiene nuseas o vmitos.  Tiene una sensacin continua de quemazn o molestias al [employer]. ASEGRESE DE QUE:   Comprende estas instrucciones.  Controlar su enfermedad.  Solicitar ayuda de inmediato si no mejora o empeora.   Esta informacin no tiene Marine scientist el consejo del mdico. Asegrese de hacerle al mdico cualquier pregunta que tenga.   Document Released: 06/13/2005 Document Revised: 05/28/2012 Elsevier Interactive Patient Education Nation[employer] Mutual Insurance.  Emergency Department Resource Guide 1) Find a Doctor and Pay Out of Pocket Although you won't have to find out who  is covered by your insurance plan, it is a good idea to ask around and get recommendations. You will then need to call the office and see if the doctor you have chosen will accept you as a new patient and what types of options they offer for patients who  are self-pay. Some doctors offer discounts or will set up payment plans for their patients who do not have insurance, but you will need to ask so you aren't surprised when you get to your appointment.  2) Contact Your Local Health Department Not all health departments have doctors that can see patients for sick visits, but many do, so it is worth a call to see if yours does. If you don't know where your local health department is, you can check in your phone book. The CDC also has a tool to help you locate your state's health department, and many state websites also have listings of all of their local health departments.  3) Find a [facility] If your illness is not likely to be very severe or complicated, you may want to try a walk in clinic. These are popping up all over the country in pharmacies, drugstores, and shopping centers. They're usually staffed by nurse practitioners or physician assistants that have been trained to treat common illnesses and complaints. They're usually fairly quick and inexpensive. However, if you have serious medical issues or chronic medical problems, these are probably not your best option.  No Primary Care Doctor: - Call Health Connect at  [phone] - they can help you locate a primary care doctor that  accepts your insurance, provides certain services, etc. - Physician Referral Service- [phone]  Chronic Pain Problems: Organization         Address  Phone   Notes  [facility]  [phone] Patients need to be referred by their primary care doctor.   Medication Assistance: Organization         Address  Phone   Notes  [facility] Medication [facility] [address] [place], [place] [address] [phone] --Must be a resident of [facility] -- Must have NO insurance coverage whatsoever (no Medicaid/ Medicare, etc.) -- The pt. MUST have a primary care doctor that directs their care regularly and follows  them in the community   MedAssist  [phone]   [employer]  [phone]    Agencies that provide inexpensive medical care: Organization         Address  Phone   Notes  [place]  [phone]   [facility]    [phone]   [facility] [place], [place] [address] [phone]   [place] [address], [place] [phone]   Planned Parenthood    [phone]   [facility]    [phone]   [place] and [address], [place] Phone:  [phone], Fax:  [phone] Hours of Operation:  9 am - 6 pm, M-F.  Also accepts Medicaid/Medicare and self-pay.  [facility] for [place] [address], [address], [place] Phone: [phone], Fax: [phone]. Hours of Operation:  8:30 am - 5:30 pm, M-F.  Also accepts Medicaid and self-pay.  [facility] [place] [address], [place] Phone: [phone]   [place], [place], [place] [phone],  Ext. [phone] Mondays & Thursdays: 7-9 AM.  First 15 patients are seen on a first come, first serve basis.    [place]itro Providers:  Organization         Address  Phone   Notes  [facility] [address], [place] [phone] Also accepts self-pay patients.  [facility] [id number] [place], [place]  [phone]   [place], [address], [place] [phone]   [facility] [address] [place] [phone]   Lucianne Lei [address], [place]   [phone] Only accepts [place] Access [place] patients after they have their name applied to their card.   Self-Pay (no insurance) in [facility]:  Organization         Address  Phone   Notes  Sickle Cell  Patients, [facility] Internal Medicine [place] [phone]   [facility]gent Care [place] [phone]   [facility] [place]  [place], [place], [place] [phone]   [facility]/Dr. Osei-Bonsu  [address], [place] or [address], [place] [phone] Phone number for both [place] and [place] locations is the same.  Urgent Medical and [facility] [address] [place] [phone]   [facility] [address], [place] or [address] [phone]   [facility] [address], [place] [phone], phone; [phone], fax Sees patients 1st and 3rd Saturday of every month.  Must not qualify for public or private insurance (i.e. Medicaid, Medicare, [place] Health Choice, Veterans' Benefits)  Household income should be no more than 200% of the poverty level The clinic cannot treat you if you are pregnant or think you are pregnant  Sexually transmitted diseases are not treated at the clinic.    Dental Care: Organization         Address  Phone  Notes  [facility] Department of [facility] [place] [phone] Accepts children up to age 44 who are enrolled in [place] or [place]; pregnant women with a Medicaid card; and children who have applied for Medicaid or [place] Health Choice, but were declined, whose parents can pay a reduced fee at time of service.  [facility] Department of [facility]  [address], [place] [phone] Accepts children up to age 82 who are enrolled in [place] or [place]; pregnant women with a Medicaid card; and children who have applied for Medicaid or  Health Choice, but were declined, whose parents can pay a reduced fee at time of service.  [place] Adult Dental Access  PROGRAM  [place] [phone] Patients are seen by appointment only. Walk-ins are not accepted. [place] will see patients 28 years of age and older. Monday - Tuesday (8am-5pm) Most Wednesdays (8:30-5pm) $30 per visit, cash only  [facility] Adult Dental Access PROGRAM  [address], [facility] [phone] Patients are seen by appointment only. Walk-ins are not accepted. [place] will see patients 65 years of age and older. One Wednesday Evening (Monthly: Volunteer Based).  $30 per visit, cash only  [place]  [phone]  for adults; Children under age 12, call Graduate Pediatric Dentistry at [phone]. Children aged 59-14, please call [phone] to request a pediatric application.  Dental services are provided in all areas of dental care including fillings, crowns and bridges, complete and partial dentures, implants, gum treatment, root canals, and extractions. Preventive care is also provided. Treatment is provided to both adults and children. Patients are selected via a lottery and there is often a waiting list.   [facility] [address] [place]  [phone] [email or web address]   [facility] [address] [place], [place] [phone], Ext. [phone] Second and Fourth Thursday of each month, opens at 6:30 AM; Clinic ends at 9 AM.  Patients are seen on a first-come first-served basis, and a limited number are seen during each clinic.   [facility]  [address] [place]ard Danker [place], [place] [phone]   Eligibility Requirements You must have lived in [place], [place], or [place] counties for at least the last three months.   You cannot be eligible for state or federal sponsored Apache [employer], including [employer], [place], or [employer].   You generally cannot be eligible for healthcare insurance through your employer.    How to apply: Eligibility  screenings are held every Tuesday and Wednesday afternoon from 1:00 pm until 4:00 pm. You do not need an appointment for the interview!  [facility] [address], [place], [place]   [place]  [place] Department  [place]  [phone]    Behavioral Health Resources in the Community: Intensive Outpatient Programs Organization         Address  Phone  Notes  [facility] [place]. [address] [place], [place] [phone]   [facility] Outpatient [address] [place], [place]   ADS: [facility] [address] [place], [place]   [place] [address],  [place], [place] or [phone]   Substance Abuse Resources Organization         Address  Phone  Notes  Al[facility] Drug Services  [phone]   [place]  [phone]   [facility] [place]   Chinita Pester  [phone]   Residential & Outpatient Substance Abuse Program  [phone]   Psychological Services Organization         Address  Phone  Notes  [facility] [place]  [place]  [phone]   [place] [address] [place] or [phone]    Mobile Crisis Teams Organization         Address  Phone  Notes  [facility], Mobile Crisis Care Unit  [phone]   [facility]  [address] [place], [place]   [place] Levels [address], [place] [phone]    Self-Help/Support Groups Organization         Address  Phone             Notes  [place]. of [place] - variety of support groups  [place] Call for more information  Narcotics Anonymous (NA), [facility] [address], [employer] [place]  2 meetings at  this location   Brewing technologist  Notes  [facility] [address],    °[place]  [phone]   °[facility] ° [address], [place], [place] [phone]   °[facility] [address], [place] [phone] Admissions: 8am-3pm M-F  °[facility] [address]    °[place], [place] [phone]   °[facility] [address], [place], [place] [phone]   °[facility] [address]  °[place], [place] [phone]   °[facility] - Intensive Outpatient [address], [place], [place] [phone]   °[facility] [address]  °[place], [place] 1-877-615-2722 or [phone]   °[facility] ([facility]) [address] , [place] [phone] Accepts Medicaid  °[facility] [address]  °[place] 1-800-659-3381 Substance Abuse/Addiction Treatment  ° °[place] Behavioral Health Resources °Organization         Address  Phone  Notes  °[facility]  ([phone]) [phone]   °Julie Brannon, PhD [address] [place], [place]   ([phone]) [phone] or (336) [phone]   °[facility]   [address] °[place], [place] ([phone]) [phone]   °[facility] [address], [place], [place] ([phone]) [phone] Insurance/Medicaid/sponsorship through [facility]  °[facility] [address]                                    [place], [place] [phone] Therapy/tele-psych/case  °[facility] [address]  ° [place],  ([phone]) [phone]    °Dr. Arfeen  ([phone]) [phone]   °[facility] [place]  [employer] [place] [facility] 1) [address], [place] °2) [address], [place] °3)  [address], [place] (336) [phone] °(336) [phone] ° °(336) [phone]   °[place] Child Abuse Hotline (336) 342-1394 or (336)  342-3537 (After Hours)    ° ° ° °

## 2015-08-17 LAB — URINE CULTURE: SPECIAL REQUESTS: NORMAL

## 2015-08-18 ENCOUNTER — Telehealth (HOSPITAL_BASED_OUTPATIENT_CLINIC_OR_DEPARTMENT_OTHER): Payer: Self-pay | Admitting: Emergency Medicine

## 2015-08-18 NOTE — Telephone Encounter (Signed)
Post ED Visit - Positive Culture Follow-up  Culture report reviewed by antimicrobial stewardship pharmacist:  []  Elenor Quinones, Pharm.D. []  Heide Guile, Pharm.D., BCPS [x]  Parks Neptune, Pharm.D. []  Alycia Rossetti, Pharm.D., BCPS []  [place], Pharm.D., BCPS, AAHIVP []  Legrand Como, Pharm.D., BCPS, AAHIVP []  Milus Glazier, Pharm.D. []  Stephens November, [place].D.  Positive urine  Culture Staph Treated with cephalexin, organism sensitive to same and no further pt follow-up is required at this time.  Hazle Nordmann 08/18/2015, 11:07 AM

## 2015-09-18 ENCOUNTER — Encounter (HOSPITAL_COMMUNITY): Payer: Self-pay | Admitting: Emergency Medicine

## 2015-09-18 ENCOUNTER — Emergency Department (HOSPITAL_COMMUNITY)
Admission: EM | Admit: 2015-09-18 | Discharge: 2015-09-18 | Discharge status: Against Medical Advice | Payer: Medicaid Other | Attending: Emergency Medicine | Admitting: Emergency Medicine

## 2015-09-18 DIAGNOSIS — J45909 Unspecified asthma, uncomplicated: Secondary | ICD-10-CM | POA: Diagnosis not present

## 2015-09-18 DIAGNOSIS — R600 Localized edema: Secondary | ICD-10-CM | POA: Insufficient documentation

## 2015-09-18 LAB — CBC WITH DIFFERENTIAL/PLATELET
BASOS PCT: 0 %
Basophils Absolute: 0 10*3/uL (ref 0.0–0.1)
EOS ABS: 0.1 10*3/uL (ref 0.0–0.7)
EOS PCT: 1 %
HCT: 34.4 % — ABNORMAL LOW (ref 36.0–46.0)
HEMOGLOBIN: 11.1 g/dL — AB (ref 12.0–15.0)
Lymphocytes Relative: 30 %
Lymphs Abs: 3 10*3/uL (ref 0.7–4.0)
MCH: 28.8 pg (ref 26.0–34.0)
MCHC: 32.3 g/dL (ref 30.0–36.0)
MCV: 89.1 fL (ref 78.0–100.0)
MONOS PCT: 3 %
Monocytes Absolute: 0.4 10*3/uL (ref 0.1–1.0)
NEUTROS PCT: 66 %
Neutro Abs: 6.7 10*3/uL (ref 1.7–7.7)
PLATELETS: 281 10*3/uL (ref 150–400)
RBC: 3.86 MIL/uL — AB (ref 3.87–5.11)
RDW: 13.3 % (ref 11.5–15.5)
WBC: 10.3 10*3/uL (ref 4.0–10.5)

## 2015-09-18 LAB — BASIC METABOLIC PANEL
Anion gap: 8 (ref 5–15)
BUN: 15 mg/dL (ref 6–20)
CHLORIDE: 108 mmol/L (ref 101–111)
CO2: 24 mmol/L (ref 22–32)
CREATININE: 0.8 mg/dL (ref 0.44–1.00)
Calcium: 8.8 mg/dL — ABNORMAL LOW (ref 8.9–10.3)
Glucose, Bld: 120 mg/dL — ABNORMAL HIGH (ref 65–99)
Potassium: 3.7 mmol/L (ref 3.5–5.1)
SODIUM: 140 mmol/L (ref 135–145)

## 2015-09-18 NOTE — ED Notes (Signed)
Pt and visitor states they have to leave because pt's baby is at home crying.  Encouraged her to stay and she states she will come back if she needs to but her baby is crying.  NAD at this time.

## 2015-09-18 NOTE — ED Notes (Signed)
Pt. reports left facial pain/swelling and redness radiating to left lateral neck onset today , denies injury , no fever or chills.

## 2015-09-19 ENCOUNTER — Encounter (HOSPITAL_COMMUNITY): Payer: Self-pay | Admitting: Emergency Medicine

## 2015-09-19 ENCOUNTER — Emergency Department (INDEPENDENT_AMBULATORY_CARE_PROVIDER_SITE_OTHER)
Admission: EM | Admit: 2015-09-19 | Discharge: 2015-09-19 | Disposition: A | Discharge status: Home / Self Care | Payer: Self-pay | Source: Home / Self Care | Attending: Family Medicine | Admitting: Family Medicine

## 2015-09-19 DIAGNOSIS — R51 Headache: Secondary | ICD-10-CM

## 2015-09-19 DIAGNOSIS — R519 Headache, unspecified: Secondary | ICD-10-CM

## 2015-09-19 DIAGNOSIS — M542 Cervicalgia: Secondary | ICD-10-CM

## 2015-09-19 MED ORDER — IBUPROFEN 600 MG PO TABS: 600.0000 mg | ORAL_TABLET | Freq: Four times a day (QID) | ORAL | Status: DC | PRN

## 2015-09-19 MED ORDER — KETOROLAC TROMETHAMINE 60 MG/2ML IM SOLN
60.0000 mg | Freq: Once | INTRAMUSCULAR | Status: AC
  Administered 2015-09-19: 60 mg via INTRAMUSCULAR

## 2015-09-19 MED ORDER — HYDROCODONE-ACETAMINOPHEN 5-325 MG PO TABS: 1.0000 | ORAL_TABLET | ORAL | Status: DC | PRN

## 2015-09-19 MED ORDER — KETOROLAC TROMETHAMINE 60 MG/2ML IM SOLN
INTRAMUSCULAR | Status: AC
  Filled 2015-09-19: qty 2

## 2015-09-19 MED ORDER — AMOXICILLIN-POT CLAVULANATE 875-125 MG PO TABS: 1.0000 | ORAL_TABLET | Freq: Two times a day (BID) | ORAL | Status: DC

## 2015-09-19 NOTE — Discharge Instructions (Signed)
Dolor msculoesqueltico (Musculoskeletal Pain) The specific reason for your pain is uncertain. He did have much tenderness to the muscles of the side of your left neck. If you develop fever, worsening of any kind, swelling in the throat or other problems he should go directly to the emergency department. If you are not getting better in a couple of days ago see your primary care doctor or to the emergency department if you are unable. El dolor musculoesqueltico se siente en huesos y msculos. El dolor puede ocurrir en cualquier parte del cuerpo. El profesional que lo asiste podr tratarlo sin Pharmacist, community causa del dolor. Lo tratar Med[employer]onic de laboratorio (sangre y [place]), las radiografas y [place]. La causa de estos dolores puede ser un virus.  CAUSAS Generalmente no existe una causa definida para este trastorno. Tambin el [place] [employer] puede deberse a la [place]. En la actividad excesiva se incluye el hacer ejercicios fsicos muy intensos cuando no se est en buena forma. El dolor de huesos tambin puede deberse a cambios climticos. Los huesos son sensibles a los cambios en la presin atmosfrica. [place]  Para proteger su privacidad, no se entregarn los Danaher Corporation pruebas por telfono. Asegrese de conseguirlos. Consulte el modo en que podr obtenerlos si no se lo han informado. Es su responsabilidad contar con los P[employer].  Utilice los medicamentos de venta libre o de prescripcin para Conservation officer, historic buildings, Health and safety inspector o la [place], segn se lo indique el profesional que lo asiste. Si le han administrado medicamentos, no conduzca, no opere maquinarias ni Teacher, adult education, y tampoco firme documentos legales durante 24 horas. No beba alcohol. No tome pldoras para dormir ni otros medicamentos que Animal nutritionist.  Podr seguir con todas las actividades a menos que stas le ocasionen ms  ARAMARK Corporation. Cuando el dolor disminuya, es importante que gradualmente reanude toda la rutina habitual. Retome las actividades comenzando lentamente. Aumente gradualmente la intensidad y la duracin de sus actividades o del ejercicio.  Durante los perodos de dolor intenso, el reposo en cama puede ser beneficioso. Recustese o sintese en la posicin que le sea ms cmoda.  Coloque hielo sobre la zona afectada.  Ponga hielo en [place].  Colquese una toalla entre la piel y la bolsa de hielo.  Aplique el hielo durante 10 a 20 minutos 3  4 veces por da.  Si el dolor empeora, o no desaparece puede ser [employer]state repetir las pruebas o Optometrist nuevos exmenes. El profesional que lo asiste podr requerir investigar ms profundamente para Animator causa posible. SOLICITE ATENCIN MDICA DE INMEDIATO SI:  Siente que el dolor empeora y no se alivia con los medicamentos.  Siente dolor en el pecho asociado a falta de aire, sudoracin, nuseas o vmitos.  El dolor se localiza en el abdomen.  Comienza a sentir nuevos sntomas que parecen ser diferentes o que lo preocupan. ASEGRESE DE QUE:   Comprende las instrucciones para el alta mdica.  Controlar su enfermedad.  Solicitar atencin mdica de inmediato segn las indicaciones.   Esta informacin no tiene Marine scientist el consejo del mdico. Asegrese de hacerle al mdico cualquier pregunta que tenga.   Document Released: 06/13/2005 Document Revised: 11/26/2011 Elsevier Interactive Patient Education Nationwide Mutual Insurance.

## 2015-09-19 NOTE — ED Notes (Signed)
Complains of headache, left face, left neck and chest.  Went to ed yesterday and left before being discharged.  Patient has returned to ucc today

## 2015-09-19 NOTE — ED Provider Notes (Signed)
CSN: [id number]     Arrival date & time 09/19/15  1633 History   First MD Initiated Contact with Patient 09/19/15 1843     Chief Complaint  Patient presents with  . Headache   (Consider location/radiation/quality/duration/timing/severity/associated sxs/prior Treatment) HPI Comments: 30 year old Hispanic female presents with a three-day history of pain to the left side of the face just below the eye, over the maxilla lesser to the lateral aspect of the face but greatest amount of pain and tenderness is just below the angle of the jaw to include the sternocleidomastoid muscle and adjacent paracervical muscles including the trapezius. The pain is worse with certain head and neck movements. She is also complaining of chest pain to the left anterior upper sternal border. This is worse with movement and taking a deep breath. Denies intraoral pain, dental pain or swelling. Denies fevers, chills. She does state that swallowing can be painful.   Past Medical History  Diagnosis Date  . Diverticulitis   . Pyelonephritis   . Pyelonephritis   . Asthma    Past Surgical History  Procedure Laterality Date  . Cholecystectomy    . Cesarean section    . Cesarean section N/A 2006  . Cesarean section N/A 09/03/2014    Procedure: CESAREAN SECTION;  Surgeon: Guss Bunde, MD;  Location: [facility];  Service: Obstetrics;  Laterality: N/A;  . Tubal ligation     Family History  Problem Relation Age of Onset  . Hyperlipidemia Mother   . Diabetes Maternal Uncle   . Diabetes Paternal Grandmother    Social History  Substance Use Topics  . Smoking status: Never Smoker   . Smokeless tobacco: Never Used  . Alcohol Use: No   OB History    This patient's OB History needs to be verified. Open OB History to review and resolve any issues.   Gravida Para Term Preterm AB TAB SAB Ectopic Multiple Living   5 3 1 2 2  2  1 2      Review of Systems  Constitutional: Positive for activity change. Negative for fever  and chills.  HENT: Negative for congestion, ear pain, hearing loss, rhinorrhea and sore throat.   Eyes: Negative.   Respiratory: Negative for cough and shortness of breath.   Cardiovascular: Positive for chest pain.  Gastrointestinal: Negative.   Skin: Negative for rash.  Neurological: Negative for dizziness, tremors, seizures, syncope and speech difficulty.    Allergies  Review of patient's allergies indicates no known allergies.  Home Medications   Prior to Admission medications   Medication Sig Start Date End Date Taking? Authorizing Provider  acetaminophen (TYLENOL) 325 MG tablet Take 650 mg by mouth every 6 (six) hours as needed.   Yes Historical Provider, MD  albuterol (PROVENTIL HFA;VENTOLIN HFA) 108 (90 BASE) MCG/ACT inhaler Inhale 4 puffs into the lungs every 6 (six) hours as needed for wheezing or shortness of breath. 08/13/14   Mariel Aloe, MD  amoxicillin-clavulanate (AUGMENTIN) 875-125 MG tablet Take 1 tablet by mouth every 12 (twelve) hours. 09/19/15   Janne Napoleon, NP  HYDROcodone-acetaminophen (NORCO/VICODIN) 5-325 MG tablet Take 1 tablet by mouth every 4 (four) hours as needed. 09/19/15   Janne Napoleon, NP  ibuprofen (ADVIL,MOTRIN) 600 MG tablet Take 1 tablet (600 mg total) by mouth every 6 (six) hours as needed. 09/19/15   Janne Napoleon, NP   Meds Ordered and Administered this Visit   Medications  ketorolac (TORADOL) injection 60 mg (not administered)    BP 121/82 mmHg  Pulse 83  Temp(Src) 98.3 F (36.8 C) (Oral)  Resp 18  SpO2 100% No data found.   Physical Exam  Constitutional: She appears well-developed and well-nourished. No distress.  HENT:  Mouth/Throat: Oropharynx is clear and moist. No oropharyngeal exudate.  Bilateral TMs are normal. EACs are normal. No discoloration or drainage from the ears. The oropharynx is clear and moist. No erythema or exudates. No signs of infection. No dental tenderness. No lesions observed.  There is puffiness just below the left  eye and over the maxilla. This area is also tender to light palpation. Mild tenderness to the left side of the face, greater tenderness to the left side of the neck just behind the angle of the jaw and postauricular. Tenderness tracks along the musculature of the left side of the neck and to the left trapezius.  Eyes: EOM are normal. Pupils are equal, round, and reactive to light.  Neck: Neck supple.  Turning the head to the left increases pain to the left side of the neck.  Cardiovascular: Normal rate, regular rhythm and normal heart sounds.   Pulmonary/Chest: Effort normal and breath sounds normal. No respiratory distress.  Lymphadenopathy:    She has no cervical adenopathy.  Neurological: She is alert. No cranial nerve deficit. She exhibits normal muscle tone.  Skin: Skin is warm and dry.  Nursing note and vitals reviewed.   ED Course  Procedures (including critical care time)  Labs Review Labs Reviewed - No data to display  Imaging Review No results found.   Visual Acuity Review  Right Eye Distance:   Left Eye Distance:   Bilateral Distance:    Right Eye Near:   Left Eye Near:    Bilateral Near:         MDM   1. Left facial pain   2. Cervical muscle pain     I am not certain as to the etiology of this pain. There is definitely tenderness to the left paracervical musculature to include the scalene muscle, trapezius muscle and a portion of the left sternocleidomastoid muscle. Not certain as to what is causing the tenderness to the left anterior face and over the maxilla. Possibly sinusitis. The patient has no fever. No palpable masses. No lymphadenopathy. No appreciable swelling on the angle of the jaw or in the neck. Will treat with a prescription of Augmentin and Norco 5 mg #15. She is also to take ibuprofen 600 mg every 6 hours. It is very important that she follows up with her PCP in the next 2-3 days. If she is worse or unable to follow up with PCP she needs to go  to the emergency department. Toradol 60 mg IM now    Janne Napoleon, NP 09/19/15 1912

## 2015-09-21 ENCOUNTER — Encounter (HOSPITAL_COMMUNITY): Payer: Self-pay | Admitting: *Deleted

## 2015-09-21 ENCOUNTER — Emergency Department (HOSPITAL_COMMUNITY): Payer: Self-pay

## 2015-09-21 DIAGNOSIS — Z792 Long term (current) use of antibiotics: Secondary | ICD-10-CM | POA: Insufficient documentation

## 2015-09-21 DIAGNOSIS — R079 Chest pain, unspecified: Secondary | ICD-10-CM | POA: Insufficient documentation

## 2015-09-21 DIAGNOSIS — R110 Nausea: Secondary | ICD-10-CM | POA: Insufficient documentation

## 2015-09-21 DIAGNOSIS — R200 Anesthesia of skin: Secondary | ICD-10-CM | POA: Insufficient documentation

## 2015-09-21 DIAGNOSIS — J45909 Unspecified asthma, uncomplicated: Secondary | ICD-10-CM | POA: Insufficient documentation

## 2015-09-21 DIAGNOSIS — R42 Dizziness and giddiness: Secondary | ICD-10-CM | POA: Insufficient documentation

## 2015-09-21 DIAGNOSIS — R51 Headache: Secondary | ICD-10-CM | POA: Insufficient documentation

## 2015-09-21 DIAGNOSIS — Z87438 Personal history of other diseases of male genital organs: Secondary | ICD-10-CM | POA: Insufficient documentation

## 2015-09-21 DIAGNOSIS — Z8719 Personal history of other diseases of the digestive system: Secondary | ICD-10-CM | POA: Insufficient documentation

## 2015-09-21 LAB — CBC
HCT: 35.5 % — ABNORMAL LOW (ref 36.0–46.0)
Hemoglobin: 11.3 g/dL — ABNORMAL LOW (ref 12.0–15.0)
MCH: 28.3 pg (ref 26.0–34.0)
MCHC: 31.8 g/dL (ref 30.0–36.0)
MCV: 89 fL (ref 78.0–100.0)
PLATELETS: 299 10*3/uL (ref 150–400)
RBC: 3.99 MIL/uL (ref 3.87–5.11)
RDW: 13.1 % (ref 11.5–15.5)
WBC: 11.8 10*3/uL — AB (ref 4.0–10.5)

## 2015-09-21 LAB — BASIC METABOLIC PANEL
Anion gap: 9 (ref 5–15)
BUN: 22 mg/dL — ABNORMAL HIGH (ref 6–20)
CALCIUM: 9.3 mg/dL (ref 8.9–10.3)
CO2: 24 mmol/L (ref 22–32)
CREATININE: 1.42 mg/dL — AB (ref 0.44–1.00)
Chloride: 106 mmol/L (ref 101–111)
GFR calc non Af Amer: 49 mL/min — ABNORMAL LOW (ref >60)
GFR, EST AFRICAN AMERICAN: 57 mL/min — AB (ref >60)
Glucose, Bld: 117 mg/dL — ABNORMAL HIGH (ref 65–99)
Potassium: 4.2 mmol/L (ref 3.5–5.1)
SODIUM: 139 mmol/L (ref 135–145)

## 2015-09-21 LAB — I-STAT TROPONIN, ED: TROPONIN I, POC: 0.01 ng/mL (ref 0.00–0.08)

## 2015-09-21 NOTE — ED Notes (Signed)
Pt recently seen at ED and UC for same.

## 2015-09-21 NOTE — ED Notes (Signed)
Pt c/o CP and headache x 3 days.

## 2015-09-22 ENCOUNTER — Emergency Department (HOSPITAL_COMMUNITY)
Admission: EM | Admit: 2015-09-22 | Discharge: 2015-09-22 | Disposition: A | Discharge status: Home / Self Care | Payer: Self-pay | Attending: Emergency Medicine | Admitting: Emergency Medicine

## 2015-09-22 ENCOUNTER — Inpatient Hospital Stay: Payer: Self-pay | Admitting: Family Medicine

## 2015-09-22 ENCOUNTER — Emergency Department (HOSPITAL_COMMUNITY): Payer: Self-pay

## 2015-09-22 DIAGNOSIS — R51 Headache: Secondary | ICD-10-CM

## 2015-09-22 DIAGNOSIS — R519 Headache, unspecified: Secondary | ICD-10-CM

## 2015-09-22 DIAGNOSIS — R079 Chest pain, unspecified: Secondary | ICD-10-CM

## 2015-09-22 LAB — I-STAT BETA HCG BLOOD, ED (MC, WL, AP ONLY): I-stat hCG, quantitative: <5 m[IU]/mL (ref <5)

## 2015-09-22 LAB — I-STAT TROPONIN, ED: Troponin i, poc: 0 ng/mL (ref 0.00–0.08)

## 2015-09-22 MED ORDER — DIPHENHYDRAMINE HCL 50 MG/ML IJ SOLN
25.0000 mg | Freq: Once | INTRAMUSCULAR | Status: AC
  Administered 2015-09-22: 25 mg via INTRAVENOUS
  Filled 2015-09-22: qty 1

## 2015-09-22 MED ORDER — SODIUM CHLORIDE 0.9 % IV BOLUS (SEPSIS)
1000.0000 mL | Freq: Once | INTRAVENOUS | Status: AC
  Administered 2015-09-22: 1000 mL via INTRAVENOUS

## 2015-09-22 MED ORDER — NAPROXEN 375 MG PO TABS: 375.0000 mg | ORAL_TABLET | Freq: Two times a day (BID) | ORAL | Status: DC

## 2015-09-22 MED ORDER — METOCLOPRAMIDE HCL 5 MG/ML IJ SOLN
10.0000 mg | Freq: Once | INTRAMUSCULAR | Status: AC
  Administered 2015-09-22: 10 mg via INTRAVENOUS
  Filled 2015-09-22: qty 2

## 2015-09-22 MED ORDER — KETOROLAC TROMETHAMINE 15 MG/ML IJ SOLN
15.0000 mg | Freq: Once | INTRAMUSCULAR | Status: AC
  Administered 2015-09-22: 15 mg via INTRAVENOUS
  Filled 2015-09-22: qty 1

## 2015-09-22 NOTE — ED Notes (Signed)
Patient is non [place] speaking. Use of interpreter line for assessment utilized.

## 2015-09-22 NOTE — ED Notes (Signed)
Pt c/o headache 4/10, chest pain 5/10 -- feels warm to touch, temp 98.8 oral.

## 2015-09-22 NOTE — ED Provider Notes (Signed)
CSN: [id number]     Arrival date & time 09/21/15  2222 History   First MD Initiated Contact with Patient 09/22/15 [phone]     Chief Complaint  Patient presents with  . Chest Pain  . Headache     (Consider location/radiation/quality/duration/timing/severity/associated sxs/prior Treatment) HPI Comments: Pt comes in with cc of chest pain and headache. Chest pain started 2 days ago. The pain is described as a pressure like pain or stabbing type pain with no known aggravating or relieving factors. Pain is intermittent and lasts for 10 minutes and has no provoking factor. No premature CAD hx or sudden unexpected deaths. Pt denies smoking or drug use. Pain does radiate to the shoulder or back occasionally. + associated nausea and dib.  PT also has a headache. Headache is generalized, dull pain and she feels numb. Headache is constant and is at 5/10. Headache has no specific aggravating or relieving factors. Headache is not worse with light, sound and is not positional. Headache has been present for 3 days.  No meds taken. She feels nauseated and dizzy with the headache. No known hx of bran AN, tumors.   ROS 10 Systems reviewed and are negative for acute change except as noted in the HPI.      Patient is a 30 y.o. female presenting with chest pain and headaches. The history is provided by the patient. A language interpreter was used.  Chest Pain Associated symptoms: headache   Headache   Past Medical History  Diagnosis Date  . Diverticulitis   . Pyelonephritis   . Pyelonephritis   . Asthma    Past Surgical History  Procedure Laterality Date  . Cholecystectomy    . Cesarean section    . Cesarean section N/A 2006  . Cesarean section N/A 09/03/2014    Procedure: CESAREAN SECTION;  Surgeon: Guss Bunde, MD;  Location: [facility];  Service: Obstetrics;  Laterality: N/A;  . Tubal ligation     Family History  Problem Relation Age of Onset  . Hyperlipidemia Mother   . Diabetes Maternal  Uncle   . Diabetes Paternal Grandmother    Social History  Substance Use Topics  . Smoking status: Never Smoker   . Smokeless tobacco: Never Used  . Alcohol Use: No   OB History    This patient's OB History needs to be verified. Open OB History to review and resolve any issues.   Gravida Para Term Preterm AB TAB SAB Ectopic Multiple Living   5 3 1 2 2  2  1 2      Review of Systems  Cardiovascular: Positive for chest pain.  Neurological: Positive for headaches.      Allergies  Review of patient's allergies indicates no known allergies.  Home Medications   Prior to Admission medications   Medication Sig Start Date End Date Taking? Authorizing Provider  acetaminophen (TYLENOL) 325 MG tablet Take 650 mg by mouth every 6 (six) hours as needed.    Historical Provider, MD  albuterol (PROVENTIL HFA;VENTOLIN HFA) 108 (90 BASE) MCG/ACT inhaler Inhale 4 puffs into the lungs every 6 (six) hours as needed for wheezing or shortness of breath. 08/13/14   Mariel Aloe, MD  amoxicillin-clavulanate (AUGMENTIN) 875-125 MG tablet Take 1 tablet by mouth every 12 (twelve) hours. 09/19/15   Janne Napoleon, NP  HYDROcodone-acetaminophen (NORCO/VICODIN) 5-325 MG tablet Take 1 tablet by mouth every 4 (four) hours as needed. 09/19/15   Janne Napoleon, NP  ibuprofen (ADVIL,MOTRIN) 600 MG tablet Take 1  tablet (600 mg total) by mouth every 6 (six) hours as needed. 09/19/15   Janne Napoleon, NP  naproxen (NAPROSYN) 375 MG tablet Take 1 tablet (375 mg total) by mouth 2 (two) times daily. 09/22/15   Samyrah Bruster, MD   BP 106/71 mmHg  Pulse 78  Temp(Src) 97.2 F (36.2 C) (Oral)  Resp 16  SpO2 99%  LMP  Physical Exam  Constitutional: She is oriented to person, place, and time. She appears well-developed.  HENT:  Head: Normocephalic and atraumatic.  Eyes: Conjunctivae and EOM are normal. Pupils are equal, round, and reactive to light.  Neck: Normal range of motion. Neck supple.  Cardiovascular: Normal rate, regular  rhythm, normal heart sounds and intact distal pulses.   Pulmonary/Chest: Effort normal and breath sounds normal. No respiratory distress.  Abdominal: Soft. Bowel sounds are normal. She exhibits no distension. There is no tenderness. There is no rebound and no guarding.  Neurological: She is alert and oriented to person, place, and time. No cranial nerve deficit. Coordination normal.  Cerebellar exam is normal (finger to nose) Sensory exam normal for bilateral upper and lower extremities - and patient is able to discriminate between sharp and dull. Motor exam is 4+/5   Skin: Skin is warm and dry.  Nursing note and vitals reviewed.   ED Course  Procedures (including critical care time) Labs Review Labs Reviewed  BASIC METABOLIC PANEL - Abnormal; Notable for the following:    Glucose, Bld 117 (*)    BUN 22 (*)    Creatinine, Ser 1.42 (*)    GFR calc non Af Amer 49 (*)    GFR calc Af Amer 57 (*)    All other components within normal limits  CBC - Abnormal; Notable for the following:    WBC 11.8 (*)    Hemoglobin 11.3 (*)    HCT 35.5 (*)    All other components within normal limits  I-STAT TROPOININ, ED  I-STAT TROPOININ, ED  I-STAT BETA HCG BLOOD, ED ([facility], [facility], [facility] ONLY)    Imaging Review Ct Head Wo Contrast  09/22/2015  CLINICAL DATA:  Headache all over for 2 days. Nausea. Left-sided weakness. EXAM: CT HEAD WITHOUT CONTRAST TECHNIQUE: Contiguous axial images were obtained from the base of the skull through the vertex without intravenous contrast. COMPARISON:  None. FINDINGS: There is no evidence of mass effect, midline shift or extra-axial fluid collections. There is no evidence of a space-occupying lesion or intracranial hemorrhage. There is no evidence of a cortical-based area of acute infarction. The ventricles and sulci are appropriate for the patient's age. The basal cisterns are patent. Visualized portions of the orbits are unremarkable. The visualized portions of the paranasal  sinuses and mastoid air cells are unremarkable. The osseous structures are unremarkable. IMPRESSION: Normal CT of the brain without intravenous contrast. Electronically Signed   By: Kathreen Devoid   On: 09/22/2015 08:30   I have personally reviewed and evaluated these images and lab results as part of my medical decision-making.   EKG Interpretation   Date/Time:  Wednesday September 21 2015 22:27:20 EST Ventricular Rate:  99 PR Interval:  146 QRS Duration: 90 QT Interval:  350 QTC Calculation: 449 R Axis:   60 Text Interpretation:  Normal sinus rhythm Normal ECG normal interval  normal axis No significant change since last tracing Confirmed by  Kathrynn Humble, MD, Thelma Comp [phone]) on 09/22/2015 5:35:23 AM      MDM   Final diagnoses:  New onset of headaches  Chest  pain, unspecified chest pain type    30 y/o w/ chest pain and headaches. Chest pain is atypical and her EKG is normal. No clinical concerns for PE, ACS. Pt has no cough with it, lungs are clear.  Also has headaches. Neuro exam is non focal - but she does have some intermittent facial numbness. CT head ordered. No nausea, vomiting, visual complains, seizures, altered mental status, loss of consciousness, new weakness, or constant  numbness, no gait instability.  If workup neg, will need pcp f/u.     Varney Biles, MD 09/24/15 [phone]

## 2015-09-22 NOTE — Discharge Instructions (Signed)
Nonspecific Chest Pain  Chest pain can be caused by many different conditions. There is always a chance that your pain could be related to something serious, such as a heart attack or a blood clot in your lungs. Chest pain can also be caused by conditions that are not life-threatening. If you have chest pain, it is very important to follow up with your health care provider. CAUSES  Chest pain can be caused by:  Heartburn.  Pneumonia or bronchitis.  Anxiety or stress.  Inflammation around your heart (pericarditis) or lung (pleuritis or pleurisy).  A blood clot in your lung.  A collapsed lung (pneumothorax). It can develop suddenly on its own (spontaneous pneumothorax) or from trauma to the chest.  Shingles infection (varicella-zoster virus).  Heart attack.  Damage to the bones, muscles, and cartilage that make up your chest wall. This can include:  Bruised bones due to injury.  Strained muscles or cartilage due to frequent or repeated coughing or overwork.  Fracture to one or more ribs.  Sore cartilage due to inflammation (costochondritis). RISK FACTORS  Risk factors for chest pain may include:  Activities that increase your risk for trauma or injury to your chest.  Respiratory infections or conditions that cause frequent coughing.  Medical conditions or overeating that can cause heartburn.  Heart disease or family history of heart disease.  Conditions or health behaviors that increase your risk of developing a blood clot.  Having had chicken pox (varicella zoster). SIGNS AND SYMPTOMS Chest pain can feel like:  Burning or tingling on the surface of your chest or deep in your chest.  Crushing, pressure, aching, or squeezing pain.  Dull or sharp pain that is worse when you move, cough, or take a deep breath.  Pain that is also felt in your back, neck, shoulder, or arm, or pain that spreads to any of these areas. Your chest pain may come and go, or it may stay  constant. DIAGNOSIS Lab tests or other studies may be needed to find the cause of your pain. Your health care provider may have you take a test called an ambulatory ECG (electrocardiogram). An ECG records your heartbeat patterns at the time the test is performed. You may also have other tests, such as:  Transthoracic echocardiogram (TTE). During echocardiography, sound waves are used to create a picture of all of the heart structures and to look at how blood flows through your heart.  Transesophageal echocardiogram (TEE).This is a more advanced imaging test that obtains images from inside your body. It allows your health care provider to see your heart in finer detail.  Cardiac monitoring. This allows your health care provider to monitor your heart rate and rhythm in real time.  Holter monitor. This is a portable device that records your heartbeat and can help to diagnose abnormal heartbeats. It allows your health care provider to track your heart activity for several days, if needed.  Stress tests. These can be done through exercise or by taking medicine that makes your heart beat more quickly.  Blood tests.  Imaging tests. TREATMENT  Your treatment depends on what is causing your chest pain. Treatment may include:  Medicines. These may include:  Acid blockers for heartburn.  Anti-inflammatory medicine.  Pain medicine for inflammatory conditions.  Antibiotic medicine, if an infection is present.  Medicines to dissolve blood clots.  Medicines to treat coronary artery disease.  Supportive care for conditions that do not require medicines. This may include:  Resting.  Applying heat  or cold packs to injured areas.  Limiting activities until pain decreases. HOME CARE INSTRUCTIONS  If you were prescribed an antibiotic medicine, finish it all even if you start to feel better.  Avoid any activities that bring on chest pain.  Do not use any tobacco products, including  cigarettes, chewing tobacco, or electronic cigarettes. If you need help quitting, ask your health care provider.  Do not drink alcohol.  Take medicines only as directed by your health care provider.  Keep all follow-up visits as directed by your health care provider. This is important. This includes any further testing if your chest pain does not go away.  If heartburn is the cause for your chest pain, you may be told to keep your head raised (elevated) while sleeping. This reduces the chance that acid will go from your stomach into your esophagus.  Make lifestyle changes as directed by your health care provider. These may include:  Getting regular exercise. Ask your health care provider to suggest some activities that are safe for you.  Eating a heart-healthy diet. A registered dietitian can help you to learn healthy eating options.  Maintaining a healthy weight.  Managing diabetes, if necessary.  Reducing stress. SEEK MEDICAL CARE IF:  Your chest pain does not go away after treatment.  You have a rash with blisters on your chest.  You have a fever. SEEK IMMEDIATE MEDICAL CARE IF:   Your chest pain is worse.  You have an increasing cough, or you cough up blood.  You have severe abdominal pain.  You have severe weakness.  You faint.  You have chills.  You have sudden, unexplained chest discomfort.  You have sudden, unexplained discomfort in your arms, back, neck, or jaw.  You have shortness of breath at any time.  You suddenly start to sweat, or your skin gets clammy.  You feel nauseous or you vomit.  You suddenly feel light-headed or dizzy.  Your heart begins to beat quickly, or it feels like it is skipping beats. These symptoms may represent a serious problem that is an emergency. Do not wait to see if the symptoms will go away. Get medical help right away. Call your local emergency services (911 in the [place] Do not drive yourself to the hospital.   This  information is not intended to replace advice given to you by your health care provider. Make sure you discuss any questions you have with your health care provider.   Document Released: 06/13/2005 Document Revised: 09/24/2014 Document Reviewed: 04/09/2014 Elsevier Interactive Patient Education 2016 [place] general sin causa (General Headache Without Cause) El dolor de cabeza es un dolor o Tree surgeon que se siente en la zona de la cabeza o del cuello. Hay muchas causas y tipos de dolores de [place]. En algunos casos, es posible que no se encuentre la causa.  CUIDADOS EN EL HOGAR  Control del [employer] de venta libre y los recetados solamente como se lo haya indicado el mdico.  Cuando sienta dolor de cabeza acustese en un cuarto oscuro y tranquilo.  Si se lo indican, aplique hielo sobre la cabeza y la zona del cuello:  Ponga el hielo en una bolsa plstica.  Coloque una toalla entre la piel y la bolsa de hielo.  Coloque el hielo durante 91minutos, 2 a 3veces por Training and development officer.  Utilice una almohadilla trmica o tome una ducha con agua caliente para aplicar calor en la cabeza y la  zona del cuello como se lo haya indicado el [place] luces tenues si le [employer] luces brillantes o sus dolores de cabeza [place]. Comida y bebida  Mantenga un horario para las comidas.  Beba menos alcohol.  Consuma menos o deje de tomar cafena. Instrucciones generales  Concurra a todas las visitas de control como se lo haya indicado el mdico. Esto es importante.  Lleve un registro diario para Neurosurgeon si ciertas cosas provocan los dolores de [place]. Por ejemplo, escriba los siguientes datos:  Lo que usted come y Buyer, retail.  Cunto tiempo duerme.  Algn cambio en su dieta o en los medicamentos.  Realice actividades relajantes, como recibir [place].  Disminuya el nivel de estrs.  Sintese con la espalda recta. No contraiga (tensione) los  msculos.  No consuma productos que contengan tabaco. Estos incluyen cigarrillos, tabaco para mascar y Psychologist, sport and exercise. Si necesita ayuda para dejar de fumar, consulte al mdico.  Haga ejercicios con regularidad tal como se lo indic el mdico.  Duerma lo suficiente. Esto a menudo significa entre 7 y 9horas de sueo. SOLICITE AYUDA SI:  Los medicamentos no logran [employer].  Tiene un dolor de cabeza que es diferente a los otros dolores de [place].  Tiene malestar estomacal (nuseas) o vomita.  Tiene fiebre. SOLICITE AYUDA DE INMEDIATO SI:   El dolor de [place].  Sigue vomitando.  Presenta rigidez en el cuello.  Tiene dificultad para ver.  Tiene dificultad para hablar.  Siente dolor en el ojo o en el odo.  Sus msculos estn dbiles, o pierde el control muscular.  Pierde el equilibrio o tiene problemas para Writer.  Siente que se desvanece (pierde el conocimiento) o se desmaya.  Se siente confundido.   Esta informacin no tiene Marine scientist el consejo del mdico. Asegrese de hacerle al mdico cualquier pregunta que tenga.   Document Released: 11/26/2011 Document Revised: 05/25/2015 Elsevier Interactive Patient Education [employer].

## 2015-09-22 NOTE — ED Notes (Signed)
Pt home after verbalizes understanding of instructions 

## 2015-09-26 ENCOUNTER — Emergency Department (INDEPENDENT_AMBULATORY_CARE_PROVIDER_SITE_OTHER)
Admission: EM | Admit: 2015-09-26 | Discharge: 2015-09-26 | Disposition: A | Discharge status: Home / Self Care | Payer: Self-pay | Source: Home / Self Care | Attending: Family Medicine | Admitting: Family Medicine

## 2015-09-26 ENCOUNTER — Encounter (HOSPITAL_COMMUNITY): Payer: Self-pay | Admitting: Emergency Medicine

## 2015-09-26 DIAGNOSIS — G43009 Migraine without aura, not intractable, without status migrainosus: Secondary | ICD-10-CM

## 2015-09-26 LAB — POCT URINALYSIS DIP (DEVICE)
Bilirubin Urine: NEGATIVE
GLUCOSE, UA: NEGATIVE mg/dL
Hgb urine dipstick: NEGATIVE
Ketones, ur: NEGATIVE mg/dL
LEUKOCYTES UA: NEGATIVE
NITRITE: NEGATIVE
PROTEIN: NEGATIVE mg/dL
SPECIFIC GRAVITY, URINE: 1.01 (ref 1.005–1.030)
UROBILINOGEN UA: 0.2 mg/dL (ref 0.0–1.0)
pH: 5.5 (ref 5.0–8.0)

## 2015-09-26 LAB — POCT PREGNANCY, URINE: Preg Test, Ur: NEGATIVE

## 2015-09-26 MED ORDER — ONDANSETRON HCL 4 MG PO TABS: 4.0000 mg | ORAL_TABLET | Freq: Four times a day (QID) | ORAL | Status: DC

## 2015-09-26 NOTE — ED Notes (Signed)
Pt is concerned about DM.... Has been feeling dizzy, nauseas, and has been experiencing urinary freq Reports family hx of DM A&O x4... No acute distress.

## 2015-09-26 NOTE — ED Provider Notes (Signed)
CSN: [id number]     Arrival date & time 09/26/15  1416 History   First MD Initiated Contact with Patient 09/26/15 1550     Chief Complaint  Patient presents with  . Dizziness   (Consider location/radiation/quality/duration/timing/severity/associated sxs/prior Treatment) HPIAssisted by interpreter.  Dizzy complaints for the last several days. 2 UC, and 1 ED visit for this complaint. Negative workup in the ED. Headache with stars and nausea. No history of migraines in the past. Room does not spin.  Frequent urination. CT scan in ED with blood work.  Past Medical History  Diagnosis Date  . Diverticulitis   . Pyelonephritis   . Pyelonephritis   . Asthma    Past Surgical History  Procedure Laterality Date  . Cholecystectomy    . Cesarean section    . Cesarean section N/A 2006  . Cesarean section N/A 09/03/2014    Procedure: CESAREAN SECTION;  Surgeon: Guss Bunde, MD;  Location: [facility];  Service: Obstetrics;  Laterality: N/A;  . Tubal ligation     Family History  Problem Relation Age of Onset  . Hyperlipidemia Mother   . Diabetes Maternal Uncle   . Diabetes Paternal Grandmother    Social History  Substance Use Topics  . Smoking status: Never Smoker   . Smokeless tobacco: Never Used  . Alcohol Use: No   OB History    This patient's OB History needs to be verified. Open OB History to review and resolve any issues.   Gravida Para Term Preterm AB TAB SAB Ectopic Multiple Living   5 3 1 2 2  2  1 2      Review of Systems ROS +'veHEADACHE, NAUSEA, frequent urination.   Denies:  ABDOMINAL PAIN, CHEST PAIN, CONGESTION, DYSURIA, SHORTNESS OF BREATH  Allergies  Review of patient's allergies indicates no known allergies.  Home Medications   Prior to Admission medications   Medication Sig Start Date End Date Taking? Authorizing Provider  albuterol (PROVENTIL HFA;VENTOLIN HFA) 108 (90 BASE) MCG/ACT inhaler Inhale 4 puffs into the lungs every 6 (six) hours as needed for  wheezing or shortness of breath. 08/13/14  Yes Mariel Aloe, MD  acetaminophen (TYLENOL) 325 MG tablet Take 650 mg by mouth every 6 (six) hours as needed.    Historical Provider, MD  amoxicillin-clavulanate (AUGMENTIN) 875-125 MG tablet Take 1 tablet by mouth every 12 (twelve) hours. 09/19/15   Janne Napoleon, NP  HYDROcodone-acetaminophen (NORCO/VICODIN) 5-325 MG tablet Take 1 tablet by mouth every 4 (four) hours as needed. 09/19/15   Janne Napoleon, NP  ibuprofen (ADVIL,MOTRIN) 600 MG tablet Take 1 tablet (600 mg total) by mouth every 6 (six) hours as needed. 09/19/15   Janne Napoleon, NP  naproxen (NAPROSYN) 375 MG tablet Take 1 tablet (375 mg total) by mouth 2 (two) times daily. 09/22/15   Varney Biles, MD   Meds Ordered and Administered this Visit  Medications - No data to display  BP 139/86 mmHg  Pulse 97  Temp(Src) 98.1 F (36.7 C) (Oral)  Resp 18  SpO2 100%  Breastfeeding? No No data found.   Physical Exam NURSES NOTES AND VITAL SIGNS REVIEWED. CONSTITUTIONAL: Well developed, well nourished, no acute distress HEENT: normocephalic, atraumatic, venous pulsations noted without nystagmus.  EYES: Conjunctiva normal NECK:normal ROM, supple PULMONARY:No respiratory distress, normal effort, Lungs: CTAb/l CARDIOVASCULAR: RRR, no murmur ABDOMEN: soft, ND, NT, +'ve BS MUSCULOSKELETAL: Normal ROM of all extremities SKIN: warm and dry without rash PSYCHIATRIC: Mood and affect normal NEURO: DTR's normal, CN 2-8  intact, cerebella intact.   ED Course  Procedures (including critical care time)  Labs Review Labs Reviewed - No data to display  Imaging Review No results found.   Visual Acuity Review  Right Eye Distance:   Left Eye Distance:   Bilateral Distance:    Right Eye Near:   Left Eye Near:    Bilateral Near:         MDM   1. Migraine without aura and without status migrainosus, not intractable    Patient is advised to continue home symptomatic treatment. Prescription for  zofran  sent pharmacy patient has indicated. Patient is advised that if there are new or worsening symptoms or attend the emergency department, or contact primary care provider. Instructions of care provided discharged home in stable condition.  THIS NOTE WAS GENERATED USING A VOICE RECOGNITION SOFTWARE PROGRAM. ALL REASONABLE EFFORTS  WERE MADE TO PROOFREAD THIS DOCUMENT FOR ACCURACY.       Konrad Felix, [place] 09/26/15 1744

## 2015-09-26 NOTE — Discharge Instructions (Signed)
Cefalea migrañosa  (Migraine Headache)  Una cefalea migrañosa es un dolor muy intenso y punzante en uno o ambos lados de la cabeza. Hable con su médico sobre los factores que pueden causar (desencadenar) las cefaleas migrañosas.  CUIDADOS EN EL HOGAR  · Tome solo los medicamentos según le haya indicado el médico.  · Cuando tenga la migraña, acuéstese en un cuarto oscuro y tranquilo  · Lleve un registro diario para averiguar si hay ciertas cosas que le provocan la cefalea migrañosa. Por ejemplo, escriba:    Lo que usted come y bebe.    Cuánto tiempo duerme.    Algún cambio en su dieta o en los medicamentos.  · Beba menos alcohol.  · Si fuma, deje de hacerlo.  · Duerma lo suficiente.  · Disminuya todo tipo de estrés de la vida diaria.  · Mantenga las luces tenues si le molestan las luces brillantes o hacen que la migraña empeore.  SOLICITE AYUDA DE INMEDIATO SI:   · La migraña empeora.  · Tiene fiebre.  · Presenta rigidez en el cuello.  · Tiene dificultad para ver.  · Sus músculos están débiles, o pierde el control muscular.  · Pierde el equilibrio o tiene problemas para caminar.  · Siente que se desvanece (debilidad) o se desmaya.  · Tiene malos síntomas que son diferentes a los primeros síntomas.  ASEGÚRESE DE QUE:   · Comprende estas instrucciones.  · Controlará su afección.  · Recibirá ayuda de inmediato si no mejora o si empeora.     Esta información no tiene como fin reemplazar el consejo del médico. Asegúrese de hacerle al médico cualquier pregunta que tenga.     Document Released: 11/30/2008 Document Revised: 09/08/2013  Elsevier Interactive Patient Education ©2016 Elsevier Inc.

## 2015-12-23 ENCOUNTER — Encounter (HOSPITAL_COMMUNITY): Payer: Self-pay | Admitting: *Deleted

## 2015-12-23 ENCOUNTER — Emergency Department (HOSPITAL_COMMUNITY)
Admission: EM | Admit: 2015-12-23 | Discharge: 2015-12-23 | Disposition: A | Discharge status: Home / Self Care | Payer: Medicaid Other | Attending: Emergency Medicine | Admitting: Emergency Medicine

## 2015-12-23 ENCOUNTER — Emergency Department (HOSPITAL_COMMUNITY): Payer: Medicaid Other

## 2015-12-23 DIAGNOSIS — M545 Low back pain: Secondary | ICD-10-CM | POA: Diagnosis present

## 2015-12-23 DIAGNOSIS — Z3202 Encounter for pregnancy test, result negative: Secondary | ICD-10-CM | POA: Insufficient documentation

## 2015-12-23 DIAGNOSIS — Z79899 Other long term (current) drug therapy: Secondary | ICD-10-CM | POA: Insufficient documentation

## 2015-12-23 DIAGNOSIS — R112 Nausea with vomiting, unspecified: Secondary | ICD-10-CM

## 2015-12-23 DIAGNOSIS — B373 Candidiasis of vulva and vagina: Secondary | ICD-10-CM | POA: Insufficient documentation

## 2015-12-23 DIAGNOSIS — R1031 Right lower quadrant pain: Secondary | ICD-10-CM

## 2015-12-23 DIAGNOSIS — N83201 Unspecified ovarian cyst, right side: Secondary | ICD-10-CM

## 2015-12-23 DIAGNOSIS — J45909 Unspecified asthma, uncomplicated: Secondary | ICD-10-CM | POA: Diagnosis not present

## 2015-12-23 DIAGNOSIS — B3731 Acute candidiasis of vulva and vagina: Secondary | ICD-10-CM

## 2015-12-23 LAB — COMPREHENSIVE METABOLIC PANEL
ALBUMIN: 4.2 g/dL (ref 3.5–5.0)
ALT: 41 U/L (ref 14–54)
AST: 37 U/L (ref 15–41)
Alkaline Phosphatase: 112 U/L (ref 38–126)
Anion gap: 11 (ref 5–15)
BILIRUBIN TOTAL: 0.3 mg/dL (ref 0.3–1.2)
BUN: 22 mg/dL — AB (ref 6–20)
CHLORIDE: 108 mmol/L (ref 101–111)
CO2: 22 mmol/L (ref 22–32)
Calcium: 9.1 mg/dL (ref 8.9–10.3)
Creatinine, Ser: 0.85 mg/dL (ref 0.44–1.00)
GFR calc Af Amer: >60 mL/min (ref >60)
GFR calc non Af Amer: >60 mL/min (ref >60)
GLUCOSE: 101 mg/dL — AB (ref 65–99)
POTASSIUM: 4 mmol/L (ref 3.5–5.1)
SODIUM: 141 mmol/L (ref 135–145)
Total Protein: 7.2 g/dL (ref 6.5–8.1)

## 2015-12-23 LAB — CBC
HEMATOCRIT: 39.8 % (ref 36.0–46.0)
HEMOGLOBIN: 13 g/dL (ref 12.0–15.0)
MCH: 29 pg (ref 26.0–34.0)
MCHC: 32.7 g/dL (ref 30.0–36.0)
MCV: 88.6 fL (ref 78.0–100.0)
Platelets: 326 10*3/uL (ref 150–400)
RBC: 4.49 MIL/uL (ref 3.87–5.11)
RDW: 13.1 % (ref 11.5–15.5)
WBC: 11.2 10*3/uL — ABNORMAL HIGH (ref 4.0–10.5)

## 2015-12-23 LAB — URINALYSIS, ROUTINE W REFLEX MICROSCOPIC
BILIRUBIN URINE: NEGATIVE
GLUCOSE, UA: NEGATIVE mg/dL
Hgb urine dipstick: NEGATIVE
KETONES UR: NEGATIVE mg/dL
Leukocytes, UA: NEGATIVE
Nitrite: NEGATIVE
PH: 6 (ref 5.0–8.0)
Protein, ur: NEGATIVE mg/dL
SPECIFIC GRAVITY, URINE: 1.037 — AB (ref 1.005–1.030)

## 2015-12-23 LAB — WET PREP, GENITAL
Clue Cells Wet Prep HPF POC: NONE SEEN
Sperm: NONE SEEN
TRICH WET PREP: NONE SEEN

## 2015-12-23 LAB — POC URINE PREG, ED: Preg Test, Ur: NEGATIVE

## 2015-12-23 MED ORDER — METOCLOPRAMIDE HCL 5 MG/ML IJ SOLN
10.0000 mg | Freq: Once | INTRAMUSCULAR | Status: AC
  Administered 2015-12-23: 10 mg via INTRAVENOUS
  Filled 2015-12-23: qty 2

## 2015-12-23 MED ORDER — KETOROLAC TROMETHAMINE 30 MG/ML IJ SOLN
30.0000 mg | Freq: Once | INTRAMUSCULAR | Status: AC
  Administered 2015-12-23: 30 mg via INTRAVENOUS
  Filled 2015-12-23: qty 1

## 2015-12-23 MED ORDER — IBUPROFEN 400 MG PO TABS: 400.0000 mg | ORAL_TABLET | Freq: Once | ORAL | Status: DC | PRN

## 2015-12-23 MED ORDER — OXYCODONE-ACETAMINOPHEN 5-325 MG PO TABS: 1.0000 | ORAL_TABLET | ORAL | Status: DC | PRN

## 2015-12-23 MED ORDER — ONDANSETRON HCL 4 MG/2ML IJ SOLN
4.0000 mg | INTRAMUSCULAR | Status: AC
  Administered 2015-12-23: 4 mg via INTRAVENOUS
  Filled 2015-12-23: qty 2

## 2015-12-23 MED ORDER — IBUPROFEN 400 MG PO TABS
ORAL_TABLET | ORAL | Status: AC
  Filled 2015-12-23: qty 1

## 2015-12-23 MED ORDER — SODIUM CHLORIDE 0.9 % IV BOLUS (SEPSIS)
1000.0000 mL | Freq: Once | INTRAVENOUS | Status: AC
  Administered 2015-12-23: 1000 mL via INTRAVENOUS

## 2015-12-23 MED ORDER — ONDANSETRON 4 MG PO TBDP: 4.0000 mg | ORAL_TABLET | Freq: Three times a day (TID) | ORAL | Status: DC | PRN

## 2015-12-23 MED ORDER — MORPHINE SULFATE (PF) 4 MG/ML IV SOLN
4.0000 mg | Freq: Once | INTRAVENOUS | Status: AC
  Administered 2015-12-23: 4 mg via INTRAVENOUS
  Filled 2015-12-23: qty 1

## 2015-12-23 MED ORDER — NAPROXEN 250 MG PO TABS: 250.0000 mg | ORAL_TABLET | Freq: Two times a day (BID) | ORAL | Status: DC

## 2015-12-23 MED ORDER — ONDANSETRON HCL 4 MG/2ML IJ SOLN
4.0000 mg | Freq: Once | INTRAMUSCULAR | Status: AC
  Administered 2015-12-23: 4 mg via INTRAVENOUS
  Filled 2015-12-23: qty 2

## 2015-12-23 MED ORDER — FLUCONAZOLE 100 MG PO TABS
200.0000 mg | ORAL_TABLET | Freq: Once | ORAL | Status: AC
  Administered 2015-12-23: 200 mg via ORAL
  Filled 2015-12-23: qty 2

## 2015-12-23 MED ORDER — HYDROCODONE-ACETAMINOPHEN 5-325 MG PO TABS: 1.0000 | ORAL_TABLET | Freq: Four times a day (QID) | ORAL | Status: DC | PRN

## 2015-12-23 MED ORDER — DIPHENHYDRAMINE HCL 50 MG/ML IJ SOLN
25.0000 mg | Freq: Once | INTRAMUSCULAR | Status: AC
  Administered 2015-12-23: 25 mg via INTRAVENOUS
  Filled 2015-12-23: qty 1

## 2015-12-23 NOTE — ED Notes (Signed)
Pt had one active episode of vomiting.  PA notified.

## 2015-12-23 NOTE — ED Notes (Signed)
Pt states bil flank  pain and burning with urination since last night.  Denies vaginal discharge/burning or constipation/diarrhea.

## 2015-12-23 NOTE — Discharge Instructions (Signed)
Quiste ovrico (Ovarian Cyst) Un quiste ovrico es una bolsa llena de lquido que se forma en el ovario. Los ovarios son los rganos pequeos que producen vulos en las mujeres. Se pueden formar varios tipos de [employer]. Benito Mccreedy no son cancerosos. Muchos de ellos no causan problemas y con frecuencia desaparecen solos. Algunos pueden provocar sntomas y requerir Clinical research associate. Los tipos ms comunes de quistes ovricos son los siguientes:  Quistes funcionales: estos quistes pueden aparecer todos los meses durante el ciclo menstrual. Esto es normal. Estos quistes suelen desaparecer con el prximo ciclo menstrual si la mujer no queda embarazada. En general, los quistes funcionales no tienen sntomas.  Endometriomas: estos quistes se forman a partir del tejido que recubre el tero. Tambin se denominan "quistes de chocolate" porque se llenan de sangre que se vuelve marrn. Este tipo de quiste puede Engineer, production en la zona inferior del abdomen durante la relacin sexual y con el perodo menstrual.  Cistoadenomas: este tipo se desarrolla a partir de las clulas que se [place] en el exterior del ovario. Estos quistes pueden ser muy grandes y causar dolor en la zona inferior del abdomen y durante la relacin sexual. Cindra Presume tipo de quiste puede girar sobre s mismo, cortar el suministro de Biochemist, clinical y causar un dolor intenso. Tambin se puede romper con facilidad y Stage manager.  Quistes dermoides: este tipo de quiste a veces se encuentra en ambos ovarios. Estos quistes pueden [employer] tipos de tejidos del organismo, como piel, dientes, pelo o Database administrator. Generalmente no tienen sntomas, a menos que sean muy grandes.  Quistes tecalutenicos: aparecen cuando se produce demasiada cantidad de cierta hormona (gonadotropina corinica humana) que estimula en exceso al ovario para que produzca vulos. Esto es ms frecuente despus de procedimientos que ayudan a la concepcin de un beb  (fertilizacin in vitro). CAUSAS   Los medicamentos para la fertilidad pueden provocar una afeccin mediante la cual se forman mltiples quistes de gran tamao en los ovarios. Esta se denomina sndrome de hiperestimulacin ovrica.  El sndrome del ovario poliqustico es una afeccin que puede causar desequilibrios hormonales, los cuales pueden dar como resultado quistes ovricos no funcionales. SIGNOS Y SNTOMAS  Muchos quistes ovricos no causan sntomas. Si se presentan sntomas, stos pueden ser:  Dolor o molestias en la pelvis.  Dolor en la parte baja del abdomen.  [place].  Aumento del permetro abdominal (hinchazn).  Perodos menstruales anormales.  Aumento del [employer] perodos [place].  Cese de los perodos menstruales sin estar embarazada. DIAGNSTICO  Estos quistes se descubren comnmente durante un examen de rutina o una exploracin ginecolgica anual. Es posible que se ordenen otros estudios para obtener ms informacin sobre el [place]. Estos estudios pueden ser:  Engineer, materials.  Radiografas de la pelvis.  Tomografa computada.  Resonancia magntica.  Anlisis de [place]. TRATAMIENTO  Muchos de los quistes ovricos desaparecen por s solos, sin tratamiento. Es probable que el mdico quiera controlar el quiste regularmente durante 2 o 28mses para ver si se produce algn cambio. En el caso de las mujeres en la menopausia, es particularmente importante controlar de cerca al quiste ya que el ndice de cncer de ovario en las mujeres menopusicas es ms alto. Cuando se requiere tClinical research associate este puede incluir cualquiera de los siguientes:  Un procedimiento para drenar el quiste (aspiracin). Esto se puede realizar mFamily Dollar Storesuso de uGu[place]grande y uArgyle Tambin se puede hacer a travs de un procedimiento laparoscpico, En  este procedimiento, se inserta un tubo delgado que emite luz y que tiene una pequea cmara en un  extremo (laparoscopio) a travs de una pequea incisin.  Ciruga para extirpar el quiste completo. Esto se puede realizar mediante una ciruga laparoscpica o una ciruga abierta, la cual implica realizar una incisin ms grande en la parte inferior del abdomen.  Tratamiento hormonal o pldoras anticonceptivas. Estos mtodos a veces se usan para ayudar a disolver un quiste. INSTRUCCIONES PARA EL CUIDADO EN EL HOGAR   Tome solo medicamentos de venta libre o recetados, segn las indicaciones del mdico.  Concurra a las consultas de control con su mdico segn las indicaciones.  Hgase exmenes plvicos regulares y pruebas de [place]apan[place]ola[place]. SOLICITE ATENCIN MDICA SI:   Los perodos se atrasan, son irregulares, dolorosos o cesan.  El dolor plvico o abdominal no desaparece.  El abdomen se agranda o se hincha.  Siente presin en la vejiga o no puede vaciarla completamente.  Siente dolor durante las relaciones sexuales.  Tiene una sensacin de hinchazn, presin o molestias en el estmago.  Pierde peso sin razn aparente.  Siente un malestar generalizado.  Est estreida.  Pierde el apetito.  Le aparece acn.  Nota un aumento del vello corporal y facial.  Aumenta de peso sin hacer modificaciones en su actividad fsica y en su dieta habitual.  Sospecha que est embarazada. SOLICITE ATENCIN MDICA DE INMEDIATO SI:   Siente cada vez ms dolor abdominal.  Tiene malestar estomacal (nuseas) y vomita.  Tiene fiebre que se presenta de manera repentina.  Siente dolor abdominal al defecar.  Sus perodos menstruales son ms abundantes que lo habitual. ASEGRESE DE QUE:   Comprende estas instrucciones.  Controlar su afeccin.  Recibir ayuda de inmediato si no mejora o si empeora.   Esta informacin no tiene como fin reemplazar el consejo del mdico. Asegrese de hacerle al mdico cualquier pregunta que tenga.   Document Released: 06/13/2005 Document Revised:  09/08/2013 Elsevier Interactive Patient Education 2016 Elsevier Inc.  

## 2015-12-23 NOTE — ED Provider Notes (Signed)
CSN: [id number]     Arrival date & time 12/23/15  1504 History  By signing my name below, I, Randa Evens, attest that this documentation has been prepared under the direction and in the presence of Waynetta Pean, PA-C. Electronically Signed: Randa Evens, ED Scribe. 12/23/2015. 5:02 PM.      Chief Complaint  Patient presents with  . Urinary Tract Infection    The history is provided by the patient. A language interpreter was used.   HPI Comments: Lorraine [name] is a 30 y.o. female with PMHx diverticulitis and pyelonephritis who presents to the Emergency Department complaining of constant low back pain onset 2 weeks prior. Pt states that the pain recent worsened 1 days ago and is worse on the right side. She reports associated dysuria that began 1 week prior. She also reports chills, nausea and vomiting x4 today.  Pt rates the severity of her pain 8/10. She states she has tried ibuprofen with no relief. Pt states that the pain is worse with movement. Pt denies hematuria, vaginal bleeding, vaginal discharge, fever, diarrhea or bowel/bladder incontinence.  Denies injury or trauma to her back. Pt denies personal HX of cancer or IV drug use. Pt states that she is sexually active in monogamous relationship. She reports that they do not use protection. Pt states that this pain does not feel like previous UTI's. Reports Hx of cholecystectomy.     Past Medical History  Diagnosis Date  . Diverticulitis   . Pyelonephritis   . Pyelonephritis   . Asthma    Past Surgical History  Procedure Laterality Date  . Cholecystectomy    . Cesarean section    . Cesarean section N/A 2006  . Cesarean section N/A 09/03/2014    Procedure: CESAREAN SECTION;  Surgeon: Guss Bunde, MD;  Location: [facility];  Service: Obstetrics;  Laterality: N/A;  . Tubal ligation     Family History  Problem Relation Age of Onset  . Hyperlipidemia Mother   . Diabetes Maternal Uncle   . Diabetes Paternal Grandmother     Social History  Substance Use Topics  . Smoking status: Never Smoker   . Smokeless tobacco: Never Used  . Alcohol Use: No   OB History    This patient's OB History needs to be verified. Open OB History to review and resolve any issues.   Gravida Para Term Preterm AB TAB SAB Ectopic Multiple Living   5 3 1 2 2  2  1 2       Review of Systems  Constitutional: Positive for chills. Negative for fever.  HENT: Negative for congestion and sore throat.   Eyes: Negative for visual disturbance.  Respiratory: Negative for cough and shortness of breath.   Cardiovascular: Negative for chest pain.  Gastrointestinal: Positive for nausea and vomiting. Negative for abdominal pain, diarrhea and blood in stool.  Genitourinary: Positive for dysuria and flank pain. Negative for urgency, hematuria, vaginal bleeding, vaginal discharge and difficulty urinating.  Musculoskeletal: Positive for back pain. Negative for neck pain.  Skin: Negative for rash.  Neurological: Negative for headaches.  All other systems reviewed and are negative.    Allergies  Review of patient's allergies indicates no known allergies.  Home Medications   Prior to Admission medications   Medication Sig Start Date End Date Taking? Authorizing Provider  acetaminophen (TYLENOL) 325 MG tablet Take 650 mg by mouth every 6 (six) hours as needed.    Historical Provider, MD  albuterol (PROVENTIL HFA;VENTOLIN HFA) 108 (90 BASE)  MCG/ACT inhaler Inhale 4 puffs into the lungs every 6 (six) hours as needed for wheezing or shortness of breath. 08/13/14   Mariel Aloe, MD  HYDROcodone-acetaminophen (NORCO/VICODIN) 5-325 MG tablet Take 1-2 tablets by mouth every 6 (six) hours as needed for moderate pain or severe pain. 12/23/15   Waynetta Pean, PA-C  naproxen (NAPROSYN) 250 MG tablet Take 1 tablet (250 mg total) by mouth 2 (two) times daily with a meal. 12/23/15   Waynetta Pean, PA-C  ondansetron (ZOFRAN ODT) 4 MG disintegrating tablet Take 1  tablet (4 mg total) by mouth every 8 (eight) hours as needed for nausea or vomiting. 12/23/15   Waynetta Pean, PA-C   BP 118/77 mmHg  Pulse 72  Temp(Src) 98.1 F (36.7 C) (Oral)  Resp 20  Ht 5\' 2"  (1.575 m)  Wt 90.039 kg  BMI 36.30 kg/m2  SpO2 100%  LMP 12/11/2015   Physical Exam  Constitutional: She appears well-developed and well-nourished. No distress.  Nontoxic appearing.  HENT:  Head: Normocephalic and atraumatic.  Eyes: Conjunctivae are normal. Pupils are equal, round, and reactive to light. Right eye exhibits no discharge. Left eye exhibits no discharge.  Neck: Neck supple.  Cardiovascular: Normal rate, regular rhythm, normal heart sounds and intact distal pulses.   Pulmonary/Chest: Effort normal and breath sounds normal. No respiratory distress. She has no wheezes. She has no rales.  Abdominal: Soft. Bowel sounds are normal. She exhibits no distension and no mass. There is tenderness. There is no rebound and no guarding.  Abdomen is soft. Bowel sounds are present. Patient has bilateral flank tenderness that is worse in her right flank. Patient also has right lower quadrant and right adnexal tenderness to palpation. No rebound tenderness. No Rovsing sign. No psoas or obturator sign.   Genitourinary: No vaginal discharge found.  Pelvic exam performed by me with female RN chaperone. Patient has right adnexal tenderness to palpation. No cervical motion tenderness. Cervix is closed. No vaginal bleeding.  Lymphadenopathy:    She has no cervical adenopathy.  Neurological: She is alert. Coordination normal.  Skin: Skin is warm and dry. No rash noted. She is not diaphoretic. No erythema. No pallor.  Psychiatric: She has a normal mood and affect. Her behavior is normal.  Nursing note and vitals reviewed.   ED Course  Procedures (including critical care time) DIAGNOSTIC STUDIES: Oxygen Saturation is 99% on RA, normal by my interpretation.    COORDINATION OF CARE: 5:01 PM-Discussed  treatment plan with pt at bedside and pt agreed to plan.     Labs Review Labs Reviewed  WET PREP, GENITAL - Abnormal; Notable for the following:    Yeast Wet Prep HPF POC PRESENT (*)    WBC, Wet Prep HPF POC FEW (*)    All other components within normal limits  COMPREHENSIVE METABOLIC PANEL - Abnormal; Notable for the following:    Glucose, Bld 101 (*)    BUN 22 (*)    All other components within normal limits  CBC - Abnormal; Notable for the following:    WBC 11.2 (*)    All other components within normal limits  URINALYSIS, ROUTINE W REFLEX MICROSCOPIC (NOT AT [facility]) - Abnormal; Notable for the following:    Specific Gravity, Urine 1.037 (*)    All other components within normal limits  POC URINE PREG, ED  GC/CHLAMYDIA PROBE AMP ([place]) NOT AT [facility]    Imaging Review US Transvaginal Non-ob  12/23/2015  CLINICAL DATA:  Right lower quadrant abdominal  pain for 1 week. Pain increased yesterday. EXAM: TRANSABDOMINAL AND TRANSVAGINAL ULTRASOUND OF PELVIS DOPPLER ULTRASOUND OF OVARIES TECHNIQUE: Both transabdominal and transvaginal ultrasound examinations of the pelvis were performed. Transabdominal technique was performed for global imaging of the pelvis including uterus, ovaries, adnexal regions, and pelvic cul-de-sac. It was necessary to proceed with endovaginal exam following the transabdominal exam to visualize the uterus, endometrium and ovaries to better advantage. Color and duplex Doppler ultrasound was utilized to evaluate blood flow to the ovaries. COMPARISON:  None. FINDINGS: Uterus Measurements: 7.1 x 3.9 x 4.5 cm. No fibroids or other mass visualized. Endometrium Thickness: 4.1 mm.  No focal abnormality visualized. Right ovary Measurements: 5.2 x 4.7 x 4.3 cm. Cyst enlarges the right ovary. It measures 4.6 x 4.1 x 3.8 cm. There is an echogenic focus along 1 wall which measures 9 mm in size. No adnexal masses. Left ovary Measurements: 3.5 x 1.8 x 1.8 cm. Normal appearance/no  adnexal mass. Pulsed Doppler evaluation of both ovaries demonstrates normal low-resistance arterial and venous waveforms. Other findings No abnormal free fluid. IMPRESSION: 1. 4.6 cm cyst mildly enlarges the right ovary. There is a small echogenic focus along 1 margin of the cyst which is nonspecific. This cyst was not present on the prior CT. The ovarian tissue shows normal arterial venous blood flow. There is no evidence of torsion. 2. No other abnormalities. This is almost certainly benign, and no specific imaging follow up is recommended according to the Society of Radiologists in [place] Statement (D Clovis Riley et al. Management of Asymptomatic Ovarian and Other Adnexal Cysts Imaged at [place]: Society of Radiologists in [place] Statement 2010. Radiology 256 (Sept 2010): [id number].). Electronically Signed   By: Lajean Manes M.D.   On: 12/23/2015 18:54   US Pelvis Complete  12/23/2015  CLINICAL DATA:  Right lower quadrant abdominal pain for 1 week. Pain increased yesterday. EXAM: TRANSABDOMINAL AND TRANSVAGINAL ULTRASOUND OF PELVIS DOPPLER ULTRASOUND OF OVARIES TECHNIQUE: Both transabdominal and transvaginal ultrasound examinations of the pelvis were performed. Transabdominal technique was performed for global imaging of the pelvis including uterus, ovaries, adnexal regions, and pelvic cul-de-sac. It was necessary to proceed with endovaginal exam following the transabdominal exam to visualize the uterus, endometrium and ovaries to better advantage. Color and duplex Doppler ultrasound was utilized to evaluate blood flow to the ovaries. COMPARISON:  None. FINDINGS: Uterus Measurements: 7.1 x 3.9 x 4.5 cm. No fibroids or other mass visualized. Endometrium Thickness: 4.1 mm.  No focal abnormality visualized. Right ovary Measurements: 5.2 x 4.7 x 4.3 cm. Cyst enlarges the right ovary. It measures 4.6 x 4.1 x 3.8 cm. There is an echogenic focus along 1 wall which measures 9  mm in size. No adnexal masses. Left ovary Measurements: 3.5 x 1.8 x 1.8 cm. Normal appearance/no adnexal mass. Pulsed Doppler evaluation of both ovaries demonstrates normal low-resistance arterial and venous waveforms. Other findings No abnormal free fluid. IMPRESSION: 1. 4.6 cm cyst mildly enlarges the right ovary. There is a small echogenic focus along 1 margin of the cyst which is nonspecific. This cyst was not present on the prior CT. The ovarian tissue shows normal arterial venous blood flow. There is no evidence of torsion. 2. No other abnormalities. This is almost certainly benign, and no specific imaging follow up is recommended according to the Society of Radiologists in [place] Statement (D Clovis Riley et al. Management of Asymptomatic Ovarian and Other Adnexal Cysts Imaged at [place]: Society of Radiologists in [place]  Statement 2010. Radiology 256 (Sept 2010): [id number].). Electronically Signed   By: Lajean Manes M.D.   On: 12/23/2015 18:54   [place] Art/ven Flow Abd Pelv Doppler  12/23/2015  CLINICAL DATA:  Right lower quadrant abdominal pain for 1 week. Pain increased yesterday. EXAM: TRANSABDOMINAL AND TRANSVAGINAL ULTRASOUND OF PELVIS DOPPLER ULTRASOUND OF OVARIES TECHNIQUE: Both transabdominal and transvaginal ultrasound examinations of the pelvis were performed. Transabdominal technique was performed for global imaging of the pelvis including uterus, ovaries, adnexal regions, and pelvic cul-de-sac. It was necessary to proceed with endovaginal exam following the transabdominal exam to visualize the uterus, endometrium and ovaries to better advantage. Color and duplex Doppler ultrasound was utilized to evaluate blood flow to the ovaries. COMPARISON:  None. FINDINGS: Uterus Measurements: 7.1 x 3.9 x 4.5 cm. No fibroids or other mass visualized. Endometrium Thickness: 4.1 mm.  No focal abnormality visualized. Right ovary Measurements: 5.2 x 4.7 x 4.3 cm. Cyst  enlarges the right ovary. It measures 4.6 x 4.1 x 3.8 cm. There is an echogenic focus along 1 wall which measures 9 mm in size. No adnexal masses. Left ovary Measurements: 3.5 x 1.8 x 1.8 cm. Normal appearance/no adnexal mass. Pulsed Doppler evaluation of both ovaries demonstrates normal low-resistance arterial and venous waveforms. Other findings No abnormal free fluid. IMPRESSION: 1. 4.6 cm cyst mildly enlarges the right ovary. There is a small echogenic focus along 1 margin of the cyst which is nonspecific. This cyst was not present on the prior CT. The ovarian tissue shows normal arterial venous blood flow. There is no evidence of torsion. 2. No other abnormalities. This is almost certainly benign, and no specific imaging follow up is recommended according to the Society of Radiologists in [place] Statement (D Clovis Riley et al. Management of Asymptomatic Ovarian and Other Adnexal Cysts Imaged at [place]: Society of Radiologists in [place] Statement 2010. Radiology 256 (Sept 2010): [id number].). Electronically Signed   By: Lajean Manes M.D.   On: 12/23/2015 18:54      EKG Interpretation None     Filed Vitals:   12/23/15 1512 12/23/15 1925  BP: 151/81 118/77  Pulse: 78 72  Temp: 98 F (36.7 C) 98.1 F (36.7 C)  TempSrc: Oral Oral  Resp: 22 20  Height: 5\' 2"  (1.575 m)   Weight: 90.039 kg   SpO2: 99% 100%    MDM   Meds given in ED:  Medications  ibuprofen (ADVIL,MOTRIN) tablet 400 mg (0 mg Oral Hold 12/23/15 1533)  fluconazole (DIFLUCAN) tablet 200 mg (not administered)  sodium chloride 0.9 % bolus 1,000 mL (0 mLs Intravenous Stopped 12/23/15 2213)  ondansetron (ZOFRAN) injection 4 mg (4 mg Intravenous Given 12/23/15 1745)  morphine 4 MG/ML injection 4 mg (4 mg Intravenous Given 12/23/15 1746)  morphine 4 MG/ML injection 4 mg (4 mg Intravenous Given 12/23/15 1952)  metoCLOPramide (REGLAN) injection 10 mg (10 mg Intravenous Given 12/23/15 2022)   diphenhydrAMINE (BENADRYL) injection 25 mg (25 mg Intravenous Given 12/23/15 2022)  ondansetron (ZOFRAN) injection 4 mg (4 mg Intravenous Given 12/23/15 2140)  ketorolac (TORADOL) 30 MG/ML injection 30 mg (30 mg Intravenous Given 12/23/15 2140)    New Prescriptions   HYDROCODONE-ACETAMINOPHEN (NORCO/VICODIN) 5-325 MG TABLET    Take 1-2 tablets by mouth every 6 (six) hours as needed for moderate pain or severe pain.   NAPROXEN (NAPROSYN) 250 MG TABLET    Take 1 tablet (250 mg total) by mouth 2 (two) times daily with a meal.   ONDANSETRON (ZOFRAN  ODT) 4 MG DISINTEGRATING TABLET    Take 1 tablet (4 mg total) by mouth every 8 (eight) hours as needed for nausea or vomiting.    Final diagnoses:  Cyst of right ovary  Non-intractable vomiting with nausea, vomiting of unspecified type  Vaginal yeast infection   This  is a 30 y.o. female with PMHx diverticulitis and pyelonephritis who presents to the Emergency Department complaining of constant low back pain onset 2 weeks prior. Pt states that the pain recent worsened 1 days ago and is worse on the right side. She reports associated dysuria that began 1 week prior. She also reports chills, nausea and vomiting x4 today.   On exam the patient is afebrile nontoxic appearing. She has some CVA tenderness on her right side as well as right adnexal tenderness on pelvic exam. There is some mild white vaginal discharge with odor. No cervical motion tenderness. No bleeding. Pregnancy test is negative. Wet prep has yeast and few white blood cells. Gonorrhea and chlamydia testing is pending. I have low suspicion is the patient is in a monogamous relationship. CMP is unremarkable. CBC is remarkable only for white count of 11,000. Urinalysis is negative for infection. Pelvic ultrasound revealed a 4.6 cm ovarian cyst. No torsion. This is likely the cause of her pain. Patient continued to have some nausea and vomited once emergency department. After Toradol and Zofran the  patient reports feeling much better and she is tolerating water by mouth. No further vomiting. She feels ready for discharge.  Will provide with Diflucan prior to discharge due to her yeast infection. We'll discharge with prescriptions for Zofran and naproxen. Also provided with a small amount of Norco for her breakthrough pain. I advised her she needs to follow-up with the women's outpatient clinic for follow-up. I discussed strict and specific return precautions. I advised the patient to follow-up with their primary care provider this week. I advised the patient to return to the emergency department with new or worsening symptoms or new concerns. The patient verbalized understanding and agreement with plan.  Care and discharge instructions were carried out using language interpreter.   I personally performed the services described in this documentation, which was scribed in my presence. The recorded information has been reviewed and is accurate.         Waynetta Pean, PA-C 12/23/15 2221  Leonard Schwartz, MD 12/31/15 [phone]

## 2015-12-26 LAB — GC/CHLAMYDIA PROBE AMP (~~LOC~~) NOT AT ARMC
CHLAMYDIA, DNA PROBE: NEGATIVE
Neisseria Gonorrhea: NEGATIVE

## 2015-12-27 ENCOUNTER — Emergency Department (HOSPITAL_COMMUNITY): Payer: Medicaid Other

## 2015-12-27 ENCOUNTER — Encounter (HOSPITAL_COMMUNITY): Payer: Self-pay | Admitting: *Deleted

## 2015-12-27 ENCOUNTER — Emergency Department (HOSPITAL_COMMUNITY)
Admission: EM | Admit: 2015-12-27 | Discharge: 2015-12-27 | Disposition: A | Discharge status: Home / Self Care | Payer: Medicaid Other | Attending: Emergency Medicine | Admitting: Emergency Medicine

## 2015-12-27 DIAGNOSIS — Z9889 Other specified postprocedural states: Secondary | ICD-10-CM | POA: Insufficient documentation

## 2015-12-27 DIAGNOSIS — Z791 Long term (current) use of non-steroidal anti-inflammatories (NSAID): Secondary | ICD-10-CM | POA: Diagnosis not present

## 2015-12-27 DIAGNOSIS — Z3202 Encounter for pregnancy test, result negative: Secondary | ICD-10-CM | POA: Insufficient documentation

## 2015-12-27 DIAGNOSIS — Z8719 Personal history of other diseases of the digestive system: Secondary | ICD-10-CM | POA: Diagnosis not present

## 2015-12-27 DIAGNOSIS — Z9851 Tubal ligation status: Secondary | ICD-10-CM | POA: Diagnosis not present

## 2015-12-27 DIAGNOSIS — J45909 Unspecified asthma, uncomplicated: Secondary | ICD-10-CM | POA: Insufficient documentation

## 2015-12-27 DIAGNOSIS — R51 Headache: Secondary | ICD-10-CM | POA: Diagnosis not present

## 2015-12-27 DIAGNOSIS — Z79899 Other long term (current) drug therapy: Secondary | ICD-10-CM | POA: Insufficient documentation

## 2015-12-27 DIAGNOSIS — N83201 Unspecified ovarian cyst, right side: Secondary | ICD-10-CM | POA: Insufficient documentation

## 2015-12-27 DIAGNOSIS — R109 Unspecified abdominal pain: Secondary | ICD-10-CM | POA: Diagnosis present

## 2015-12-27 DIAGNOSIS — Z9049 Acquired absence of other specified parts of digestive tract: Secondary | ICD-10-CM | POA: Diagnosis not present

## 2015-12-27 DIAGNOSIS — R1031 Right lower quadrant pain: Secondary | ICD-10-CM

## 2015-12-27 LAB — COMPREHENSIVE METABOLIC PANEL
ALBUMIN: 4 g/dL (ref 3.5–5.0)
ALT: 52 U/L (ref 14–54)
AST: 38 U/L (ref 15–41)
Alkaline Phosphatase: 101 U/L (ref 38–126)
Anion gap: 11 (ref 5–15)
BILIRUBIN TOTAL: 0.2 mg/dL — AB (ref 0.3–1.2)
BUN: 14 mg/dL (ref 6–20)
CHLORIDE: 106 mmol/L (ref 101–111)
CO2: 24 mmol/L (ref 22–32)
Calcium: 9 mg/dL (ref 8.9–10.3)
Creatinine, Ser: 0.74 mg/dL (ref 0.44–1.00)
GFR calc Af Amer: >60 mL/min (ref >60)
GFR calc non Af Amer: >60 mL/min (ref >60)
GLUCOSE: 140 mg/dL — AB (ref 65–99)
POTASSIUM: 4 mmol/L (ref 3.5–5.1)
SODIUM: 141 mmol/L (ref 135–145)
Total Protein: 7.5 g/dL (ref 6.5–8.1)

## 2015-12-27 LAB — CBC
HEMATOCRIT: 37.4 % (ref 36.0–46.0)
HEMOGLOBIN: 12.2 g/dL (ref 12.0–15.0)
MCH: 28.9 pg (ref 26.0–34.0)
MCHC: 32.6 g/dL (ref 30.0–36.0)
MCV: 88.6 fL (ref 78.0–100.0)
Platelets: 307 10*3/uL (ref 150–400)
RBC: 4.22 MIL/uL (ref 3.87–5.11)
RDW: 13.1 % (ref 11.5–15.5)
WBC: 9.9 10*3/uL (ref 4.0–10.5)

## 2015-12-27 LAB — URINALYSIS, ROUTINE W REFLEX MICROSCOPIC
BILIRUBIN URINE: NEGATIVE
Glucose, UA: NEGATIVE mg/dL
Hgb urine dipstick: NEGATIVE
KETONES UR: NEGATIVE mg/dL
Leukocytes, UA: NEGATIVE
Nitrite: NEGATIVE
PH: 7 (ref 5.0–8.0)
Protein, ur: NEGATIVE mg/dL
SPECIFIC GRAVITY, URINE: 1.025 (ref 1.005–1.030)

## 2015-12-27 LAB — I-STAT BETA HCG BLOOD, ED (MC, WL, AP ONLY)

## 2015-12-27 LAB — LIPASE, BLOOD: LIPASE: 22 U/L (ref 11–51)

## 2015-12-27 MED ORDER — IOPAMIDOL (ISOVUE-300) INJECTION 61%
INTRAVENOUS | Status: AC
  Administered 2015-12-27: 100 mL
  Filled 2015-12-27: qty 100

## 2015-12-27 MED ORDER — METOCLOPRAMIDE HCL 5 MG/ML IJ SOLN
10.0000 mg | Freq: Once | INTRAMUSCULAR | Status: AC
  Administered 2015-12-27: 10 mg via INTRAVENOUS
  Filled 2015-12-27: qty 2

## 2015-12-27 MED ORDER — HYDROMORPHONE HCL 1 MG/ML IJ SOLN
1.0000 mg | Freq: Once | INTRAMUSCULAR | Status: AC
  Administered 2015-12-27: 1 mg via INTRAVENOUS
  Filled 2015-12-27: qty 1

## 2015-12-27 MED ORDER — PROMETHAZINE HCL 25 MG PO TABS: 25.0000 mg | ORAL_TABLET | Freq: Four times a day (QID) | ORAL | Status: DC | PRN

## 2015-12-27 MED ORDER — ONDANSETRON HCL 4 MG/2ML IJ SOLN
4.0000 mg | Freq: Once | INTRAMUSCULAR | Status: AC
  Administered 2015-12-27: 4 mg via INTRAVENOUS
  Filled 2015-12-27: qty 2

## 2015-12-27 NOTE — ED Notes (Signed)
Per interpretor line [phone]:  Pt came to ED this evening for vomiting, headache, and right side of abdomen.  Pt states she started feeling dizzy about 2300 and then started vomiting. PT states had abdominal pain since yesterday afternoon.

## 2015-12-27 NOTE — Discharge Instructions (Signed)
Quiste ovrico (Ovarian Cyst) Un quiste ovrico es una bolsa llena de lquido que se forma en el ovario. Los ovarios son los rganos pequeos que producen vulos en las mujeres. Se pueden formar varios tipos de Lorraine [name]. Lorraine [name] no son cancerosos. Muchos de ellos no causan problemas y con frecuencia desaparecen solos. Algunos pueden provocar sntomas y requerir Clinical research associate. Los tipos ms comunes de quistes ovricos son los siguientes:  Quistes funcionales: estos quistes pueden aparecer todos los meses durante el ciclo menstrual. Esto es normal. Estos quistes suelen desaparecer con el prximo ciclo menstrual si la mujer no queda embarazada. En general, los quistes funcionales no tienen sntomas.  Endometriomas: estos quistes se forman a partir del tejido que recubre el tero. Tambin se denominan "quistes de chocolate" porque se llenan de sangre que se vuelve marrn. Este tipo de quiste puede Engineer, production en la zona inferior del abdomen durante la relacin sexual y con el perodo menstrual.  Cistoadenomas: este tipo se desarrolla a partir de las clulas que se [place] en el exterior del ovario. Estos quistes pueden ser muy grandes y causar dolor en la zona inferior del abdomen y durante la relacin sexual. Lorraine [name] tipo de quiste puede girar sobre s mismo, cortar el suministro de Biochemist, clinical y causar un dolor intenso. Tambin se puede romper con facilidad y Stage manager.  Quistes dermoides: este tipo de quiste a veces se encuentra en ambos ovarios. Estos quistes pueden [employer] tipos de tejidos del organismo, como piel, dientes, pelo o Database administrator. Generalmente no tienen sntomas, a menos que sean muy grandes.  Quistes tecalutenicos: aparecen cuando se produce demasiada cantidad de cierta hormona (gonadotropina corinica humana) que estimula en exceso al ovario para que produzca vulos. Esto es ms frecuente despus de procedimientos que ayudan a la concepcin de un beb  (fertilizacin in vitro). CAUSAS   Los medicamentos para la fertilidad pueden provocar una afeccin mediante la cual se forman mltiples quistes de gran tamao en los ovarios. Esta se denomina sndrome de hiperestimulacin ovrica.  El sndrome del ovario poliqustico es una afeccin que puede causar desequilibrios hormonales, los cuales pueden dar como resultado quistes ovricos no funcionales. SIGNOS Y SNTOMAS  Muchos quistes ovricos no causan sntomas. Si se presentan sntomas, stos pueden ser:  Dolor o molestias en la pelvis.  Dolor en la parte baja del abdomen.  [place].  Aumento del permetro abdominal (hinchazn).  Perodos menstruales anormales.  Aumento del [employer] perodos [place].  Cese de los perodos menstruales sin estar embarazada. DIAGNSTICO  Estos quistes se descubren comnmente durante un examen de rutina o una exploracin ginecolgica anual. Es posible que se ordenen otros estudios para obtener ms informacin sobre el [place]. Estos estudios pueden ser:  Engineer, materials.  Radiografas de la pelvis.  Tomografa computada.  Resonancia magntica.  Anlisis de [place]. TRATAMIENTO  Muchos de los quistes ovricos desaparecen por s solos, sin tratamiento. Es probable que el mdico quiera controlar el quiste regularmente durante 2 o 28mses para ver si se produce algn cambio. En el caso de las mujeres en la menopausia, es particularmente importante controlar de cerca al quiste ya que el ndice de cncer de ovario en las mujeres menopusicas es ms alto. Cuando se requiere tClinical research associate este puede incluir cualquiera de los siguientes:  Un procedimiento para drenar el quiste (aspiracin). Esto se puede realizar mFamily Dollar Storesuso de u[place]ande y uArgyle Tambin se puede hacer a travs de un procedimiento laparoscpico, En  este procedimiento, se inserta un tubo delgado que emite luz y que tiene una pequea cmara en un  extremo (laparoscopio) a travs de una pequea incisin.  Ciruga para extirpar el quiste completo. Esto se puede realizar mediante una ciruga laparoscpica o una ciruga abierta, la cual implica realizar una incisin ms grande en la parte inferior del abdomen.  Tratamiento hormonal o pldoras anticonceptivas. Estos mtodos a veces se usan para ayudar a disolver un quiste. INSTRUCCIONES PARA EL CUIDADO EN EL HOGAR   Tome solo medicamentos de venta libre o recetados, segn las indicaciones del mdico.  Concurra a las consultas de control con su mdico segn las indicaciones.  Hgase exmenes plvicos regulares y pruebas de Papanicolaou. SOLICITE ATENCIN MDICA SI:   Los perodos se atrasan, son irregulares, dolorosos o cesan.  El dolor plvico o abdominal no desaparece.  El abdomen se agranda o se hincha.  Siente presin en la vejiga o no puede vaciarla completamente.  Siente dolor durante las relaciones sexuales.  Tiene una sensacin de hinchazn, presin o molestias en el estmago.  Pierde peso sin razn aparente.  Siente un malestar generalizado.  Est estreida.  Pierde el apetito.  Le aparece acn.  Nota un aumento del vello corporal y facial.  Aumenta de peso sin hacer modificaciones en su actividad fsica y en su dieta habitual.  Sospecha que est embarazada. SOLICITE ATENCIN MDICA DE INMEDIATO SI:   Siente cada vez ms dolor abdominal.  Tiene malestar estomacal (nuseas) y vomita.  Tiene fiebre que se presenta de manera repentina.  Siente dolor abdominal al defecar.  Sus perodos menstruales son ms abundantes que lo habitual. ASEGRESE DE QUE:   Comprende estas instrucciones.  Controlar su afeccin.  Recibir ayuda de inmediato si no mejora o si empeora.   Esta informacin no tiene como fin reemplazar el consejo del mdico. Asegrese de hacerle al mdico cualquier pregunta que tenga.   Document Released: 06/13/2005 Document Revised:  09/08/2013 Elsevier Interactive Patient Education 2016 Elsevier Inc.  

## 2015-12-27 NOTE — ED Provider Notes (Signed)
CSN: [id number]     Arrival date & time 12/27/15  [id number] History  By signing my name below, I, Evelene Croon, attest that this documentation has been prepared under the direction and in the presence of Orpah Greek, MD . Electronically Signed: Evelene Croon, Scribe. 12/27/2015. 4:01 AM.    Chief Complaint  Patient presents with  . Abdominal Pain  . Emesis  . Headache    The history is provided by the patient and medical records. No language interpreter was used.     HPI Comments:  Lorraine [name] is a 30 y.o. female who presents to the Emergency Department complaining of  HA with associated vomiting onset ~ 2300 last night (12/26/15). She also notes right sided abdominal pain.  No alleviating factors noted. Pt was evaluated in the ED on 12/23/15 for lower back pain, nausea, vomiting and urinary symptoms. She was diagnosed with an ovarian cyst and vaginal yeast infection. Pt was discharged home with diflucan, zofran and naproxen.    Past Medical History  Diagnosis Date  . Diverticulitis   . Pyelonephritis   . Pyelonephritis   . Asthma    Past Surgical History  Procedure Laterality Date  . Cholecystectomy    . Cesarean section    . Cesarean section N/A 2006  . Cesarean section N/A 09/03/2014    Procedure: CESAREAN SECTION;  Surgeon: Guss Bunde, MD;  Location: [facility];  Service: Obstetrics;  Laterality: N/A;  . Tubal ligation     Family History  Problem Relation Age of Onset  . Hyperlipidemia Mother   . Diabetes Maternal Uncle   . Diabetes Paternal Grandmother    Social History  Substance Use Topics  . Smoking status: Never Smoker   . Smokeless tobacco: Never Used  . Alcohol Use: No   OB History    This patient's OB History needs to be verified. Open OB History to review and resolve any issues.   Gravida Para Term Preterm AB TAB SAB Ectopic Multiple Living   5 3 1 2 2  2  1 2      Review of Systems  Constitutional: Negative for fever.  Gastrointestinal:  Positive for nausea, vomiting and abdominal pain.  Neurological: Positive for headaches.  All other systems reviewed and are negative.     Allergies  Review of patient's allergies indicates no known allergies.  Home Medications   Prior to Admission medications   Medication Sig Start Date End Date Taking? Authorizing Provider  acetaminophen (TYLENOL) 325 MG tablet Take 650 mg by mouth every 6 (six) hours as needed.    Historical Provider, MD  albuterol (PROVENTIL HFA;VENTOLIN HFA) 108 (90 BASE) MCG/ACT inhaler Inhale 4 puffs into the lungs every 6 (six) hours as needed for wheezing or shortness of breath. 08/13/14   Mariel Aloe, MD  HYDROcodone-acetaminophen (NORCO/VICODIN) 5-325 MG tablet Take 1-2 tablets by mouth every 6 (six) hours as needed for moderate pain or severe pain. 12/23/15   Waynetta Pean, PA-C  naproxen (NAPROSYN) 250 MG tablet Take 1 tablet (250 mg total) by mouth 2 (two) times daily with a meal. 12/23/15   Waynetta Pean, PA-C  ondansetron (ZOFRAN ODT) 4 MG disintegrating tablet Take 1 tablet (4 mg total) by mouth every 8 (eight) hours as needed for nausea or vomiting. 12/23/15   Waynetta Pean, PA-C   BP 139/94 mmHg  Pulse 101  Temp(Src) 98 F (36.7 C) (Oral)  Resp 20  SpO2 99%  LMP 12/11/2015 Physical Exam  Constitutional: She  is oriented to person, place, and time. She appears well-developed and well-nourished. No distress.  HENT:  Head: Normocephalic and atraumatic.  Right Ear: Hearing normal.  Left Ear: Hearing normal.  Nose: Nose normal.  Mouth/Throat: Oropharynx is clear and moist and mucous membranes are normal.  Eyes: Conjunctivae and EOM are normal. Pupils are equal, round, and reactive to light.  Neck: Normal range of motion. Neck supple.  Cardiovascular: Regular rhythm, S1 normal and S2 normal.  Exam reveals no gallop and no friction rub.   No murmur heard. Pulmonary/Chest: Effort normal and breath sounds normal. No respiratory distress. She exhibits  no tenderness.  Abdominal: Soft. Normal appearance and bowel sounds are normal. There is no hepatosplenomegaly. There is tenderness (RLQ). There is no rebound, no guarding, no tenderness at McBurney's point and negative Murphy's sign. No hernia.  Musculoskeletal: Normal range of motion.  Neurological: She is alert and oriented to person, place, and time. She has normal strength. No cranial nerve deficit or sensory deficit. Coordination normal. GCS eye subscore is 4. GCS verbal subscore is 5. GCS motor subscore is 6.  Skin: Skin is warm, dry and intact. No rash noted. No cyanosis.  Psychiatric: She has a normal mood and affect. Her speech is normal and behavior is normal. Thought content normal.  Nursing note and vitals reviewed.   ED Course  Procedures  DIAGNOSTIC STUDIES:  Oxygen Saturation is 99% on RA, normal by my interpretation.    COORDINATION OF CARE:  3:57 AM Discussed treatment plan with pt at bedside and pt agreed to plan.  Labs Review Labs Reviewed  CBC  LIPASE, BLOOD  COMPREHENSIVE METABOLIC PANEL  URINALYSIS, ROUTINE W REFLEX MICROSCOPIC (NOT AT [facility])  I-STAT BETA HCG BLOOD, ED ([facility], [facility], [facility] ONLY)    Imaging Review No results found. I have personally reviewed and evaluated these images and lab results as part of my medical decision-making.   EKG Interpretation None      MDM   Final diagnoses:  None   abdominal and pelvic pain  Patient presents to the ER for evaluation of abdominal pain. Patient reports right-sided abdominal pain that began 11:00 last night. Patient reports the pain is sharp and stabbing. Patient was seen in the ER 2 days ago with complaints of abdominal pain and diagnosed with urinary tract infection. She reports that the pain she is currently experiencing is different than the pain she had the other day. During that evaluation, however, she was diagnosed with a right ovarian cyst.  Patient complaining of constant and severe pain in the right  lower quadrant and did have tenderness without guarding or rebound. CT scan performed to evaluate for appendicitis. No acute pathology is noted. Patient administered IV fluids and analgesia. She has now had blood work on 2 separate occasions in the last 3 days, a pelvic ultrasound and an abdominal and pelvic CT without any acute findings. There is no indication that she has any significant pathology and can be discharged and follow-up with OB/GYN for ovarian cyst.  I personally performed the services described in this documentation, which was scribed in my presence. The recorded information has been reviewed and is accurate.    Orpah Greek, MD 12/27/15 [phone]

## 2015-12-30 ENCOUNTER — Inpatient Hospital Stay (HOSPITAL_COMMUNITY): Payer: Medicaid Other

## 2015-12-30 ENCOUNTER — Inpatient Hospital Stay (HOSPITAL_COMMUNITY)
Admission: AD | Admit: 2015-12-30 | Discharge: 2015-12-30 | Disposition: A | Discharge status: Home / Self Care | Payer: Medicaid Other | Source: Ambulatory Visit | Attending: Obstetrics & Gynecology | Admitting: Obstetrics & Gynecology

## 2015-12-30 ENCOUNTER — Encounter (HOSPITAL_COMMUNITY): Payer: Self-pay | Admitting: *Deleted

## 2015-12-30 DIAGNOSIS — N83209 Unspecified ovarian cyst, unspecified side: Secondary | ICD-10-CM

## 2015-12-30 DIAGNOSIS — R109 Unspecified abdominal pain: Secondary | ICD-10-CM | POA: Diagnosis present

## 2015-12-30 DIAGNOSIS — R1031 Right lower quadrant pain: Secondary | ICD-10-CM | POA: Diagnosis not present

## 2015-12-30 DIAGNOSIS — Z791 Long term (current) use of non-steroidal anti-inflammatories (NSAID): Secondary | ICD-10-CM | POA: Diagnosis not present

## 2015-12-30 DIAGNOSIS — R102 Pelvic and perineal pain: Secondary | ICD-10-CM | POA: Diagnosis not present

## 2015-12-30 LAB — URINALYSIS, ROUTINE W REFLEX MICROSCOPIC
BILIRUBIN URINE: NEGATIVE
Glucose, UA: NEGATIVE mg/dL
Hgb urine dipstick: NEGATIVE
KETONES UR: NEGATIVE mg/dL
LEUKOCYTES UA: NEGATIVE
NITRITE: NEGATIVE
PH: 5.5 (ref 5.0–8.0)
PROTEIN: NEGATIVE mg/dL
Specific Gravity, Urine: 1.025 (ref 1.005–1.030)

## 2015-12-30 LAB — CBC
HCT: 34.8 % — ABNORMAL LOW (ref 36.0–46.0)
Hemoglobin: 11.7 g/dL — ABNORMAL LOW (ref 12.0–15.0)
MCH: 29.3 pg (ref 26.0–34.0)
MCHC: 33.6 g/dL (ref 30.0–36.0)
MCV: 87 fL (ref 78.0–100.0)
PLATELETS: 297 10*3/uL (ref 150–400)
RBC: 4 MIL/uL (ref 3.87–5.11)
RDW: 13.3 % (ref 11.5–15.5)
WBC: 11.9 10*3/uL — AB (ref 4.0–10.5)

## 2015-12-30 LAB — POCT PREGNANCY, URINE: PREG TEST UR: NEGATIVE

## 2015-12-30 MED ORDER — PROMETHAZINE HCL 25 MG PO TABS: 12.5000 mg | ORAL_TABLET | Freq: Four times a day (QID) | ORAL | Status: DC | PRN

## 2015-12-30 MED ORDER — KETOROLAC TROMETHAMINE 60 MG/2ML IM SOLN
60.0000 mg | INTRAMUSCULAR | Status: AC
  Administered 2015-12-30: 60 mg via INTRAMUSCULAR
  Filled 2015-12-30: qty 2

## 2015-12-30 MED ORDER — OXYCODONE-ACETAMINOPHEN 5-325 MG PO TABS: 1.0000 | ORAL_TABLET | Freq: Four times a day (QID) | ORAL | Status: DC | PRN

## 2015-12-30 MED ORDER — ONDANSETRON 8 MG PO TBDP
8.0000 mg | ORAL_TABLET | ORAL | Status: AC
  Administered 2015-12-30: 8 mg via ORAL
  Filled 2015-12-30: qty 1

## 2015-12-30 NOTE — MAU Note (Signed)
Pt presents complaining of nausea and vomiting since Monday. Having lower back and abdominal pain. Rating pain 7/10 and has tried pain medication that doesn't help. Was at [facility] on Monday and gave her pain medication that did not help. Was told she has a cyst and to come to [facility] hospital if the pain is worse. Denies leaking or bleeding.

## 2015-12-30 NOTE — Discharge Instructions (Signed)
Dolor abdominal en adultos (Abdominal Pain, Adult) El dolor puede tener muchas causas. Normalmente la causa del dolor abdominal no es una enfermedad y Teacher, English as a foreign language sin Clinical research associate. Frecuentemente puede controlarse y tratarse en casa. Su mdico le Chartered certified accountant examen fsico y posiblemente solicite anlisis de sangre y radiografas para ayudar a Teacher, adult education la gravedad de su dolor. Sin embargo, en [employer], debe transcurrir ms tiempo antes de que se pueda Pension scheme manager una causa evidente del dolor. Antes de llegar a ese punto, es posible que su mdico no sepa si necesita ms pruebas o un tratamiento ms profundo. INSTRUCCIONES PARA EL CUIDADO EN EL HOGAR  Est atento al dolor para ver si hay cambios. Las siguientes indicaciones ayudarn a Chief Strategy Officer que pueda sentir:  [place] solo medicamentos de venta libre o recetados, segn las indicaciones del mdico.  No tome laxantes a menos que se lo haya indicado su mdico.  Pruebe con Ardelia Mems dieta lquida absoluta (caldo, t o agua) segn se lo indique su mdico. Introduzca gradualmente una dieta normal, segn su tolerancia. SOLICITE ATENCIN MDICA SI:  Tiene dolor abdominal sin explicacin.  Tiene dolor abdominal relacionado con nuseas o diarrea.  Tiene dolor cuando orina o defeca.  Experimenta dolor abdominal que lo despierta de noche.  Tiene dolor abdominal que empeora o mejora cuando come alimentos.  Tiene dolor abdominal que empeora cuando come alimentos grasosos.  Tiene fiebre. SOLICITE ATENCIN MDICA DE INMEDIATO SI:   El dolor no desaparece en un plazo mximo de 2horas.  No deja de (vomitar).  El Social research officer, government se siente solo en partes del abdomen, como el lado derecho o la parte inferior izquierda del abdomen.  Evaca materia fecal sanguinolenta o negra, de aspecto alquitranado. ASEGRESE DE QUE:  Comprende estas instrucciones.  Controlar su afeccin.  Recibir ayuda de inmediato si no mejora o si empeora.   Esta  informacin no tiene Marine scientist el consejo del mdico. Asegrese de hacerle al mdico cualquier pregunta que tenga.   Document Released: 09/03/2005 Document Revised: 09/24/2014 Elsevier Interactive Patient Education [employer].  Diverticulosis (Diverticulosis) La diverticulosis es una enfermedad que aparece cuando se forman pequeos bolsillos (divertculos) en las paredes del colon. El colon, o intestino grueso, es el lugar donde se absorbe agua y se forman las heces. Los bolsillos se forman cuando la capa interna del colon ejerce presin sobre los puntos dbiles de las capas externas. CAUSAS  Nadie sabe con exactitud qu causa la diverticulosis. FACTORES DE RIESGO  Ser mayor de 22aos. El riesgo de desarrollar esta enfermedad aumenta con la edad. La diverticulosis es poco frecuente en las personas menores de [place]. A los 80aos, casi todas las personas tienen la enfermedad.  Comer una dieta con bajo contenido de [place].  Estar estreido con frecuencia.  Tener sobrepeso.  No hacer suficiente ejercicio fsico.  Fumar.  Tomar analgsicos de venta libre, como aspirina e ibuprofeno. SNTOMAS  La mayora de las personas que tienen diverticulosis no presentan sntomas. DIAGNSTICO  Dado que la diverticulosis no suele causar sntomas, los mdicos a menudo descubren la enfermedad durante un examen de otros problemas de colon. En muchos casos, el mdico diagnosticar la diverticulosis mientras utiliza un endoscopio flexible para examinar el colon (colonoscopa). TRATAMIENTO  Si nunca tuvo una infeccin relacionada con la diverticulosis, es posible que no necesite tratamiento. Si ha tenido una infeccin antes, el tratamiento puede incluir:  Comer ms frutas, verduras y cereales.  Tomar un suplemento de [place].  Tomar un suplemento de bacterias vivas (probitico).  Tomar medicamentos para relajar el colon. INSTRUCCIONES PARA EL CUIDADO EN EL HOGAR   Beba por lo menos  entre 6 y 8vasos de agua por da para Engineer, civil (consulting).  Trate de no hacer fuerza al mover el intestino.  Cumpla con todas las visitas de control. Si ha tenido una infeccin antes:   Aumente la cantidad de fibra en la dieta, segn las indicaciones del mdico o del nutricionista.  Tome un suplemento dietario con fibras si el mdico lo autoriza.  Tome los medicamentos solamente como se lo haya indicado el mdico. SOLICITE ATENCIN MDICA SI:   Siente dolor abdominal.  Tiene meteorismo.  Tiene clicos.  No ha defecado en 3das. SOLICITE ATENCIN MDICA DE INMEDIATO SI:   El dolor empeora.  El meteorismo [employer].  Tiene fiebre o escalofros, y los sntomas empeoran repentinamente.  Comienza a vomitar.  La materia fecal es sanguinolenta o negra. ASEGRESE DE QUE:  Comprende estas instrucciones.  Controlar su afeccin.  Recibir ayuda de inmediato si no mejora o si empeora.   Esta informacin no tiene Marine scientist el consejo del mdico. Asegrese de hacerle al mdico cualquier pregunta que tenga.   Document Released: 08/16/2008 Document Revised: 09/08/2013 Elsevier Interactive Patient Education [employer].

## 2015-12-30 NOTE — MAU Provider Note (Signed)
Chief Complaint: Abdominal Pain   First Provider Initiated Contact with Patient 12/30/15 0334      SUBJECTIVE HPI: Lorraine [name] is a 30 y.o. [id number] who presents to maternity admissions reporting RLQ pain x 2 weeks with worsening pain and onset of n/v x 3 days. She was seen for her symptoms on 4/7 and 4/11 at [facility] and diagnosed with a simple right ovarian cyst.  CT scan on 4/11 shows normal appendix and no evidence of acute diverticulitis.  She denies constipation but reports diarrhea x 2-3/day since the onset of nausea.   She is taking naproxen and hydrocodone at home but they are not helping her pain.  She denies upper abdominal pain, as pain is localized in RLQ only. She denies vaginal bleeding, vaginal itching/burning, urinary symptoms, h/a, dizziness, n/v, or fever/chills.     HPI  Past Medical History  Diagnosis Date  . Diverticulitis   . Pyelonephritis   . Pyelonephritis   . Asthma    Past Surgical History  Procedure Laterality Date  . Cholecystectomy    . Cesarean section    . Cesarean section N/A 2006  . Cesarean section N/A 09/03/2014    Procedure: CESAREAN SECTION;  Surgeon: Guss Bunde, MD;  Location: [facility];  Service: Obstetrics;  Laterality: N/A;  . Tubal ligation     Social History   Social History  . Marital Status: Married    Spouse Name: N/A  . Number of Children: N/A  . Years of Education: N/A   Occupational History  . Not on file.   Social History Main Topics  . Smoking status: Never Smoker   . Smokeless tobacco: Never Used  . Alcohol Use: No  . Drug Use: No  . Sexual Activity: Yes    Birth Control/ Protection: Surgical, None   Other Topics Concern  . Not on file   Social History Narrative   ** Merged History Encounter **       No current facility-administered medications on file prior to encounter.   Current Outpatient Prescriptions on File Prior to Encounter  Medication Sig Dispense Refill  . albuterol (PROVENTIL HFA;VENTOLIN  HFA) 108 (90 BASE) MCG/ACT inhaler Inhale 4 puffs into the lungs every 6 (six) hours as needed for wheezing or shortness of breath. 1 Inhaler 2  . HYDROcodone-acetaminophen (NORCO/VICODIN) 5-325 MG tablet Take 1-2 tablets by mouth every 6 (six) hours as needed for moderate pain or severe pain. 12 tablet 0  . naproxen (NAPROSYN) 250 MG tablet Take 1 tablet (250 mg total) by mouth 2 (two) times daily with a meal. 30 tablet 0  . ondansetron (ZOFRAN ODT) 4 MG disintegrating tablet Take 1 tablet (4 mg total) by mouth every 8 (eight) hours as needed for nausea or vomiting. 10 tablet 0  . promethazine (PHENERGAN) 25 MG tablet Take 1 tablet (25 mg total) by mouth every 6 (six) hours as needed for nausea or vomiting. 30 tablet 0   No Known Allergies  ROS:  Review of Systems  Constitutional: Negative for fever, chills and fatigue.  Respiratory: Negative for shortness of breath.   Cardiovascular: Negative for chest pain.  Gastrointestinal: Positive for nausea, vomiting, abdominal pain and diarrhea.  Genitourinary: Positive for pelvic pain. Negative for dysuria, flank pain, vaginal bleeding, vaginal discharge, difficulty urinating and vaginal pain.  Neurological: Negative for dizziness and headaches.  Psychiatric/Behavioral: Negative.      I have reviewed patient's Past Medical Hx, Surgical Hx, Family Hx, Social Hx, medications and allergies.  Physical Exam   Patient Vitals for the past 24 hrs:  BP Temp Temp src Pulse Resp SpO2  12/30/15 0217 (!) 118/51 mmHg 98.1 F (36.7 C) Oral 80 18 99 %   Constitutional: Well-developed, well-nourished female in no acute distress.  HEART: normal rate, heart sounds, regular rhythm RESP: normal effort, lung sounds clear and equal bilaterally GI: Abd soft, RLQ tenderness present, no rebound tenderness or guarding. Pos BS x 4 MS: Extremities nontender, no edema, normal ROM Neurologic: Alert and oriented x 4.  GU: Neg CVAT.  PELVIC EXAM: Cervix pink, visually  closed, without lesion, scant white creamy discharge, vaginal walls and external genitalia normal Bimanual exam: Cervix 0/long/high, firm, anterior, neg CMT, uterus nontender, nonenlarged, adnexa without tenderness, enlargement, or mass   LAB RESULTS Results for orders placed or performed during the hospital encounter of 12/30/15 (from the past 24 hour(s))  Urinalysis, Routine w reflex microscopic (not at [facility])     Status: None   Collection Time: 12/30/15  1:30 AM  Result Value Ref Range   Color, Urine YELLOW YELLOW   APPearance CLEAR CLEAR   Specific Gravity, Urine 1.025 1.005 - 1.030   pH 5.5 5.0 - 8.0   Glucose, UA NEGATIVE NEGATIVE mg/dL   Hgb urine dipstick NEGATIVE NEGATIVE   Bilirubin Urine NEGATIVE NEGATIVE   Ketones, ur NEGATIVE NEGATIVE mg/dL   Protein, ur NEGATIVE NEGATIVE mg/dL   Nitrite NEGATIVE NEGATIVE   Leukocytes, UA NEGATIVE NEGATIVE  Pregnancy, urine POC     Status: None   Collection Time: 12/30/15  2:11 AM  Result Value Ref Range   Preg Test, Ur NEGATIVE NEGATIVE  CBC     Status: Abnormal   Collection Time: 12/30/15  4:04 AM  Result Value Ref Range   WBC 11.9 (H) 4.0 - 10.5 K/uL   RBC 4.00 3.87 - 5.11 MIL/uL   Hemoglobin 11.7 (L) 12.0 - 15.0 g/dL   HCT 34.8 (L) 36.0 - 46.0 %   MCV 87.0 78.0 - 100.0 fL   MCH 29.3 26.0 - 34.0 pg   MCHC 33.6 30.0 - 36.0 g/dL   RDW 13.3 11.5 - 15.5 %   Platelets 297 150 - 400 K/uL       IMAGING US Transvaginal Non-ob  12/23/2015  CLINICAL DATA:  Right lower quadrant abdominal pain for 1 week. Pain increased yesterday. EXAM: TRANSABDOMINAL AND TRANSVAGINAL ULTRASOUND OF PELVIS DOPPLER ULTRASOUND OF OVARIES TECHNIQUE: Both transabdominal and transvaginal ultrasound examinations of the pelvis were performed. Transabdominal technique was performed for global imaging of the pelvis including uterus, ovaries, adnexal regions, and pelvic cul-de-sac. It was necessary to proceed with endovaginal exam following the transabdominal exam to  visualize the uterus, endometrium and ovaries to better advantage. Color and duplex Doppler ultrasound was utilized to evaluate blood flow to the ovaries. COMPARISON:  None. FINDINGS: Uterus Measurements: 7.1 x 3.9 x 4.5 cm. No fibroids or other mass visualized. Endometrium Thickness: 4.1 mm.  No focal abnormality visualized. Right ovary Measurements: 5.2 x 4.7 x 4.3 cm. Cyst enlarges the right ovary. It measures 4.6 x 4.1 x 3.8 cm. There is an echogenic focus along 1 wall which measures 9 mm in size. No adnexal masses. Left ovary Measurements: 3.5 x 1.8 x 1.8 cm. Normal appearance/no adnexal mass. Pulsed Doppler evaluation of both ovaries demonstrates normal low-resistance arterial and venous waveforms. Other findings No abnormal free fluid. IMPRESSION: 1. 4.6 cm cyst mildly enlarges the right ovary. There is a small echogenic focus along 1 margin of  the cyst which is nonspecific. This cyst was not present on the prior CT. The ovarian tissue shows normal arterial venous blood flow. There is no evidence of torsion. 2. No other abnormalities. This is almost certainly benign, and no specific imaging follow up is recommended according to the Society of Radiologists in [place] Statement (D Clovis Riley et al. Management of Asymptomatic Ovarian and Other Adnexal Cysts Imaged at [place]: Society of Radiologists in [place] Statement 2010. Radiology 256 (Sept 2010): [id number].). Electronically Signed   By: Lajean Manes M.D.   On: 12/23/2015 18:54   US Pelvis Complete  12/23/2015  CLINICAL DATA:  Right lower quadrant abdominal pain for 1 week. Pain increased yesterday. EXAM: TRANSABDOMINAL AND TRANSVAGINAL ULTRASOUND OF PELVIS DOPPLER ULTRASOUND OF OVARIES TECHNIQUE: Both transabdominal and transvaginal ultrasound examinations of the pelvis were performed. Transabdominal technique was performed for global imaging of the pelvis including uterus, ovaries, adnexal regions, and pelvic  cul-de-sac. It was necessary to proceed with endovaginal exam following the transabdominal exam to visualize the uterus, endometrium and ovaries to better advantage. Color and duplex Doppler ultrasound was utilized to evaluate blood flow to the ovaries. COMPARISON:  None. FINDINGS: Uterus Measurements: 7.1 x 3.9 x 4.5 cm. No fibroids or other mass visualized. Endometrium Thickness: 4.1 mm.  No focal abnormality visualized. Right ovary Measurements: 5.2 x 4.7 x 4.3 cm. Cyst enlarges the right ovary. It measures 4.6 x 4.1 x 3.8 cm. There is an echogenic focus along 1 wall which measures 9 mm in size. No adnexal masses. Left ovary Measurements: 3.5 x 1.8 x 1.8 cm. Normal appearance/no adnexal mass. Pulsed Doppler evaluation of both ovaries demonstrates normal low-resistance arterial and venous waveforms. Other findings No abnormal free fluid. IMPRESSION: 1. 4.6 cm cyst mildly enlarges the right ovary. There is a small echogenic focus along 1 margin of the cyst which is nonspecific. This cyst was not present on the prior CT. The ovarian tissue shows normal arterial venous blood flow. There is no evidence of torsion. 2. No other abnormalities. This is almost certainly benign, and no specific imaging follow up is recommended according to the Society of Radiologists in [place] Statement (D Clovis Riley et al. Management of Asymptomatic Ovarian and Other Adnexal Cysts Imaged at [place]: Society of Radiologists in [place] Statement 2010. Radiology 256 (Sept 2010): [id number].). Electronically Signed   By: Lajean Manes M.D.   On: 12/23/2015 18:54   Ct Abdomen Pelvis W Contrast  12/27/2015  CLINICAL DATA:  Headache, vomiting, RIGHT abdominal pain beginning at 2300 hours. History of pyelonephritis. EXAM: CT ABDOMEN AND PELVIS WITH CONTRAST TECHNIQUE: Multidetector CT imaging of the abdomen and pelvis was performed using the standard protocol following bolus administration of intravenous  contrast. CONTRAST:  150mL ISOVUE-300 IOPAMIDOL (ISOVUE-300) INJECTION 61% COMPARISON:  Abdominal ultrasound December 23, 2015 and CT abdomen and pelvis May 18, 2015 FINDINGS: LUNG BASES: Included view of the lung bases are clear. Visualized heart and pericardium are unremarkable. Small amount air in the distal esophagus can be seen with reflux. SOLID ORGANS: The liver, spleen, pancreas and adrenal glands are unremarkable. Status cholecystectomy. GASTROINTESTINAL TRACT: The stomach, small and large bowel are normal in course and caliber without inflammatory changes. Mild descending colonic diverticulosis. Normal appendix. KIDNEYS/ URINARY TRACT: Kidneys are orthotopic, demonstrating symmetric enhancement. No nephrolithiasis, hydronephrosis or solid renal masses. Early excretion of contrast limits detection of small non obstructing nephrolithiasis. The unopacified ureters are normal in course and caliber. Urinary bladder is partially  distended and unremarkable. PERITONEUM/RETROPERITONEUM: Aortoiliac vessels are normal in course and caliber. No lymphadenopathy by CT size criteria. Internal reproductive organs are normal ; no enlarged adnexal cysts by CT. Tubal ligation clips noted. No intraperitoneal free fluid nor free air. SOFT TISSUE/OSSEOUS STRUCTURES: Non-suspicious. Since her pelvic wall scarring. Small fat containing umbilical hernia. IMPRESSION: No acute intra-abdominal or pelvic process. Mild diverticulosis without CT findings of acute diverticulitis. Electronically Signed   By: Elon Alas M.D.   On: 12/27/2015 05:12   [place] Art/ven Flow Abd Pelv Doppler  12/23/2015  CLINICAL DATA:  Right lower quadrant abdominal pain for 1 week. Pain increased yesterday. EXAM: TRANSABDOMINAL AND TRANSVAGINAL ULTRASOUND OF PELVIS DOPPLER ULTRASOUND OF OVARIES TECHNIQUE: Both transabdominal and transvaginal ultrasound examinations of the pelvis were performed. Transabdominal technique was performed for global imaging of  the pelvis including uterus, ovaries, adnexal regions, and pelvic cul-de-sac. It was necessary to proceed with endovaginal exam following the transabdominal exam to visualize the uterus, endometrium and ovaries to better advantage. Color and duplex Doppler ultrasound was utilized to evaluate blood flow to the ovaries. COMPARISON:  None. FINDINGS: Uterus Measurements: 7.1 x 3.9 x 4.5 cm. No fibroids or other mass visualized. Endometrium Thickness: 4.1 mm.  No focal abnormality visualized. Right ovary Measurements: 5.2 x 4.7 x 4.3 cm. Cyst enlarges the right ovary. It measures 4.6 x 4.1 x 3.8 cm. There is an echogenic focus along 1 wall which measures 9 mm in size. No adnexal masses. Left ovary Measurements: 3.5 x 1.8 x 1.8 cm. Normal appearance/no adnexal mass. Pulsed Doppler evaluation of both ovaries demonstrates normal low-resistance arterial and venous waveforms. Other findings No abnormal free fluid. IMPRESSION: 1. 4.6 cm cyst mildly enlarges the right ovary. There is a small echogenic focus along 1 margin of the cyst which is nonspecific. This cyst was not present on the prior CT. The ovarian tissue shows normal arterial venous blood flow. There is no evidence of torsion. 2. No other abnormalities. This is almost certainly benign, and no specific imaging follow up is recommended according to the Society of Radiologists in [place] Statement (D Clovis Riley et al. Management of Asymptomatic Ovarian and Other Adnexal Cysts Imaged at [place]: Society of Radiologists in [place] Statement 2010. Radiology 256 (Sept 2010): [id number].). Electronically Signed   By: Lajean Manes M.D.   On: 12/23/2015 18:54    MAU Management/MDM: Ordered labs and reviewed results.  Repeat pelvic US shows previous cyst not present.  With normal CT on 4/11, and no other clear cause for pain, likely ruptured ovarian cyst causing pain.  Differential includes acute diverticulitis not seen on prior CT.   Toradol 60 mg IM x 1 dose in MAU with small reduction in pt pain.  Percocet 5/325, take 1-2 tabs Q 6 hours PRN x 6 tabs.  Pt driving so unable to give narcotics in MAU.   Pt to go to ED if symptoms persist or worsen.  Pt stable at time of discharge.  ASSESSMENT 1. Pelvic pain in female     PLAN Discharge home    Medication List    ASK your doctor about these medications        albuterol 108 (90 Base) MCG/ACT inhaler  Commonly known as:  PROVENTIL HFA;VENTOLIN HFA  Inhale 4 puffs into the lungs every 6 (six) hours as needed for wheezing or shortness of breath.     HYDROcodone-acetaminophen 5-325 MG tablet  Commonly known as:  NORCO/VICODIN  Take 1-2 tablets by mouth  every 6 (six) hours as needed for moderate pain or severe pain.     naproxen 250 MG tablet  Commonly known as:  NAPROSYN  Take 1 tablet (250 mg total) by mouth 2 (two) times daily with a meal.     ondansetron 4 MG disintegrating tablet  Commonly known as:  ZOFRAN ODT  Take 1 tablet (4 mg total) by mouth every 8 (eight) hours as needed for nausea or vomiting.     promethazine 25 MG tablet  Commonly known as:  PHENERGAN  Take 1 tablet (25 mg total) by mouth every 6 (six) hours as needed for nausea or vomiting.         Fatima Blank Certified Nurse-Midwife 12/30/2015  5:32 AM

## 2016-01-02 ENCOUNTER — Ambulatory Visit (HOSPITAL_COMMUNITY)
Admission: EM | Admit: 2016-01-02 | Discharge: 2016-01-02 | Disposition: A | Discharge status: Home / Self Care | Payer: Medicaid Other | Attending: Family Medicine | Admitting: Family Medicine

## 2016-01-02 ENCOUNTER — Encounter (HOSPITAL_COMMUNITY): Payer: Self-pay | Admitting: Emergency Medicine

## 2016-01-02 ENCOUNTER — Inpatient Hospital Stay (HOSPITAL_COMMUNITY)
Admission: AD | Admit: 2016-01-02 | Discharge: 2016-01-02 | Disposition: A | Discharge status: Home / Self Care | Payer: Medicaid Other | Source: Ambulatory Visit | Attending: Family Medicine | Admitting: Family Medicine

## 2016-01-02 ENCOUNTER — Encounter (HOSPITAL_COMMUNITY): Payer: Self-pay

## 2016-01-02 DIAGNOSIS — R1032 Left lower quadrant pain: Secondary | ICD-10-CM | POA: Diagnosis not present

## 2016-01-02 DIAGNOSIS — R102 Pelvic and perineal pain: Secondary | ICD-10-CM

## 2016-01-02 DIAGNOSIS — R1031 Right lower quadrant pain: Secondary | ICD-10-CM | POA: Insufficient documentation

## 2016-01-02 DIAGNOSIS — M545 Low back pain, unspecified: Secondary | ICD-10-CM

## 2016-01-02 LAB — CBC WITH DIFFERENTIAL/PLATELET
Basophils Absolute: 0.1 K/uL (ref 0.0–0.1)
Basophils Relative: 1 %
Eosinophils Absolute: 0.5 K/uL (ref 0.0–0.7)
Eosinophils Relative: 4 %
HCT: 36 % (ref 36.0–46.0)
Hemoglobin: 12 g/dL (ref 12.0–15.0)
Lymphocytes Relative: 34 %
Lymphs Abs: 3.7 K/uL (ref 0.7–4.0)
MCH: 29.3 pg (ref 26.0–34.0)
MCHC: 33.3 g/dL (ref 30.0–36.0)
MCV: 88 fL (ref 78.0–100.0)
Monocytes Absolute: 0.5 K/uL (ref 0.1–1.0)
Monocytes Relative: 4 %
Neutro Abs: 6.3 K/uL (ref 1.7–7.7)
Neutrophils Relative %: 57 %
Platelets: 297 K/uL (ref 150–400)
RBC: 4.09 MIL/uL (ref 3.87–5.11)
RDW: 13.2 % (ref 11.5–15.5)
WBC: 11.1 K/uL — ABNORMAL HIGH (ref 4.0–10.5)

## 2016-01-02 LAB — POCT PREGNANCY, URINE: Preg Test, Ur: NEGATIVE

## 2016-01-02 LAB — URINALYSIS, ROUTINE W REFLEX MICROSCOPIC
Bilirubin Urine: NEGATIVE
Glucose, UA: NEGATIVE mg/dL
Hgb urine dipstick: NEGATIVE
Ketones, ur: NEGATIVE mg/dL
Leukocytes, UA: NEGATIVE
Nitrite: NEGATIVE
Protein, ur: NEGATIVE mg/dL
Specific Gravity, Urine: 1.02 (ref 1.005–1.030)
pH: 6 (ref 5.0–8.0)

## 2016-01-02 MED ORDER — OXYCODONE-ACETAMINOPHEN 5-325 MG PO TABS: 1.0000 | ORAL_TABLET | ORAL | Status: DC | PRN

## 2016-01-02 MED ORDER — OXYCODONE-ACETAMINOPHEN 5-325 MG PO TABS
2.0000 | ORAL_TABLET | Freq: Once | ORAL | Status: AC
  Administered 2016-01-02: 2 via ORAL
  Filled 2016-01-02: qty 2

## 2016-01-02 NOTE — Discharge Instructions (Signed)
Dolor abdominal en adultos (Abdominal Pain, Adult) El dolor puede tener muchas causas. Normalmente la causa del dolor abdominal no es una enfermedad y Teacher, English as a foreign language sin Clinical research associate. Frecuentemente puede controlarse y tratarse en casa. Su mdico le Chartered certified accountant examen fsico y posiblemente solicite anlisis de sangre y radiografas para ayudar a Teacher, adult education la gravedad de su dolor. Sin embargo, en [employer], debe transcurrir ms tiempo antes de que se pueda Pension scheme manager una causa evidente del dolor. Antes de llegar a ese punto, es posible que su mdico no sepa si necesita ms pruebas o un tratamiento ms profundo. INSTRUCCIONES PARA EL CUIDADO EN EL HOGAR  Est atento al dolor para ver si hay cambios. Las siguientes indicaciones ayudarn a Chief Strategy Officer que pueda sentir:  [place] solo medicamentos de venta libre o recetados, segn las indicaciones del mdico.  No tome laxantes a menos que se lo haya indicado su mdico.  Pruebe con Ardelia Mems dieta lquida absoluta (caldo, t o agua) segn se lo indique su mdico. Introduzca gradualmente una dieta normal, segn su tolerancia. SOLICITE ATENCIN MDICA SI:  Tiene dolor abdominal sin explicacin.  Tiene dolor abdominal relacionado con nuseas o diarrea.  Tiene dolor cuando orina o defeca.  Experimenta dolor abdominal que lo despierta de noche.  Tiene dolor abdominal que empeora o mejora cuando come alimentos.  Tiene dolor abdominal que empeora cuando come alimentos grasosos.  Tiene fiebre. SOLICITE ATENCIN MDICA DE INMEDIATO SI:   El dolor no desaparece en un plazo mximo de 2horas.  No deja de (vomitar).  El Social research officer, government se siente solo en partes del abdomen, como el lado derecho o la parte inferior izquierda del abdomen.  Evaca materia fecal sanguinolenta o negra, de aspecto alquitranado. ASEGRESE DE QUE:  Comprende estas instrucciones.  Controlar su afeccin.  Recibir ayuda de inmediato si no mejora o si empeora.   Esta  informacin no tiene Marine scientist el consejo del mdico. Asegrese de hacerle al mdico cualquier pregunta que tenga.   Document Released: 09/03/2005 Document Revised: 09/24/2014 Elsevier Interactive Patient Education 2016 [place] plvico, mujeres (Pelvic Pain, Female)  El dolor plvico se siente debajo del ombligo y a nivel de las caderas. Puede tener diferentes causas. Es importante recibir asistencia inmediatamente. Especialmente en los casos de dolor agudo, intenso e inusual que aparece de [place] sbita.  CUIDADOS EN EL HOGAR   Slo tome los medicamentos segn le indique el mdico.  Haga reposo tal como le indic el mdico.  Consuma una dieta rica en frutas, vegetales y carnes [place].  Beba gran cantidad de lquido para mantener el pis (orina) de tono claro o amarillo plido.  Evite las relaciones sexuales, Higher education careers adviser.  Aplique compresas calientes o fras en la zona baja del vientre (abdomen). Aplique la compresa que Higher education careers adviser.  Evite las situaciones que le causen estrs.  Lleve un registro Education administrator. Alla German:  Cundo comenz el dolor.  Dnde se localiza.  Las cosas que parecen relacionarse con el dolor, como las comidas o el perodo menstrual.  Concurra a las consultas de control con el mdico, segn las indicaciones. SOLICITE AYUDA DE INMEDIATO SI:   Tiene una hemorragia por la vagina.  Siente ms dolor plvico.  Se siente dbil, tiene mareos o se desvanece (se desmaya).  Siente escalofros.  Siente dolor al orinar u observa sangre en la orina.  La materia fecal es lquida (diarrea).  No puede detener los vmitos.  Tiene fiebre o sntomas que persisten durante ms  de 3 das.  Tiene fiebre y los sntomas empeoran.  Ha sido abusada fsica o sexualmente.  Los medicamentos no Buyer, retail.  Observa lquido (secrecin) por la vagina o un lquido que no es normal. ASEGRESE DE QUE:   Comprende estas  instrucciones.  Controlar su enfermedad.  Solicitar ayuda de inmediato si no mejora o si empeora.   Esta informacin no tiene Marine scientist el consejo del mdico. Asegrese de hacerle al mdico cualquier pregunta que tenga.   Document Released: 03/04/2012 Document Revised: 09/24/2014 Elsevier Interactive Patient Education [employer].

## 2016-01-02 NOTE — ED Notes (Signed)
Patient has right flank pain and head ache.

## 2016-01-02 NOTE — MAU Provider Note (Signed)
History   [name] is a 30 yo hispanic female in with recurrent RLQ pain. Pt has had pelvic u/s and CT of abd that were neg 3 days ago.When questioned regarding pap and physical pt states she has not had on.  But today has some nausea and vomiting today.  CSN: [id number]  Arrival date & time 01/02/16  1740   First Provider Initiated Contact with Patient 01/02/16 1930      Chief Complaint  Patient presents with  . Abdominal Pain  . Emesis  . Diarrhea    HPI  Past Medical History  Diagnosis Date  . Diverticulitis   . Pyelonephritis   . Pyelonephritis   . Asthma     Inhaler used 01/02/16    Past Surgical History  Procedure Laterality Date  . Cholecystectomy    . Cesarean section    . Cesarean section N/A 2006  . Cesarean section N/A 09/03/2014    Procedure: CESAREAN SECTION;  Surgeon: Guss Bunde, MD;  Location: [facility];  Service: Obstetrics;  Laterality: N/A;  . Tubal ligation      Family History  Problem Relation Age of Onset  . Hyperlipidemia Mother   . Diabetes Maternal Uncle   . Diabetes Paternal Grandmother     Social History  Substance Use Topics  . Smoking status: Never Smoker   . Smokeless tobacco: Never Used  . Alcohol Use: No    OB History    Gravida Para Term Preterm AB TAB SAB Ectopic Multiple Living   5 3 1 2 2  2  1 2       Review of Systems  Constitutional: Negative.   HENT: Negative.   Eyes: Negative.   Respiratory: Negative.   Cardiovascular: Negative.   Gastrointestinal: Positive for nausea, vomiting, abdominal pain and diarrhea.  Endocrine: Negative.   Genitourinary: Negative.   Musculoskeletal: Negative.   Skin: Negative.   Allergic/Immunologic: Negative.   Neurological: Negative.   Hematological: Negative.   Psychiatric/Behavioral: Negative.     Allergies  Review of patient's allergies indicates no known allergies.  Home Medications  No current outpatient prescriptions on file.  BP 122/76 mmHg  Pulse 83  Temp(Src)  97.9 F (36.6 C) (Oral)  Resp 18  Ht 5\' 2"  (1.575 m)  Wt 196 lb (88.905 kg)  BMI 35.84 kg/m2  LMP 12/11/2015  Physical Exam  Constitutional: She is oriented to person, place, and time. She appears well-developed and well-nourished.  HENT:  Head: Normocephalic.  Eyes: Pupils are equal, round, and reactive to light.  Neck: Normal range of motion.  Cardiovascular: Normal rate, regular rhythm, normal heart sounds and intact distal pulses.   Pulmonary/Chest: Effort normal and breath sounds normal.  Abdominal: Soft. Bowel sounds are normal.  Genitourinary: Vagina normal and uterus normal.  Musculoskeletal: Normal range of motion.  Neurological: She is alert and oriented to person, place, and time. She has normal reflexes.  Skin: Skin is warm and dry.  Psychiatric: She has a normal mood and affect. Her behavior is normal. Judgment and thought content normal.    MAU Course  Procedures (including critical care time)  Labs Reviewed  URINALYSIS, [place] (NOT AT [facility]) - Abnormal; Notable for the following:    APPearance HAZY (*)    All other components within normal limits  CBC WITH DIFFERENTIAL/PLATELET  POCT PREGNANCY, URINE   No results found.   No diagnosis found.    MDM  RLQ pain. With neg imaging.  Plan: manage  pain, pt to call clinic and schedule pap and physical. Will get CBC to r/o appendix. No rebound tendernesss or garding. D/c home

## 2016-01-02 NOTE — Discharge Instructions (Signed)
Dolor abdominal en adultos °(Abdominal Pain, Adult) °El dolor puede tener muchas causas. Normalmente la causa del dolor abdominal no es una enfermedad y mejorará sin tratamiento. Frecuentemente puede controlarse y tratarse en casa. Su médico le realizará un examen físico y posiblemente solicite análisis de sangre y radiografías para ayudar a determinar la gravedad de su dolor. Sin embargo, en muchos casos, debe transcurrir más tiempo antes de que se pueda encontrar una causa evidente del dolor. Antes de llegar a ese punto, es posible que su médico no sepa si necesita más pruebas o un tratamiento más profundo. °INSTRUCCIONES PARA EL CUIDADO EN EL HOGAR  °Esté atento al dolor para ver si hay cambios. Las siguientes indicaciones ayudarán a aliviar cualquier molestia que pueda sentir: °· Tome solo medicamentos de venta libre o recetados, según las indicaciones del médico. °· No tome laxantes a menos que se lo haya indicado su médico. °· Pruebe con una dieta líquida absoluta (caldo, té o agua) según se lo indique su médico. Introduzca gradualmente una dieta normal, según su tolerancia. °SOLICITE ATENCIÓN MÉDICA SI: °· Tiene dolor abdominal sin explicación. °· Tiene dolor abdominal relacionado con náuseas o diarrea. °· Tiene dolor cuando orina o defeca. °· Experimenta dolor abdominal que lo despierta de noche. °· Tiene dolor abdominal que empeora o mejora cuando come alimentos. °· Tiene dolor abdominal que empeora cuando come alimentos grasosos. °· Tiene fiebre. °SOLICITE ATENCIÓN MÉDICA DE INMEDIATO SI:  °· El dolor no desaparece en un plazo máximo de 2 horas. °· No deja de (vomitar). °· El dolor se siente solo en partes del abdomen, como el lado derecho o la parte inferior izquierda del abdomen. °· Evacúa materia fecal sanguinolenta o negra, de aspecto alquitranado. °ASEGÚRESE DE QUE: °· Comprende estas instrucciones. °· Controlará su afección. °· Recibirá ayuda de inmediato si no mejora o si empeora. °  °Esta  información no tiene como fin reemplazar el consejo del médico. Asegúrese de hacerle al médico cualquier pregunta que tenga. °  °Document Released: 09/03/2005 Document Revised: 09/24/2014 °Elsevier Interactive Patient Education ©2016 [employer] ° °

## 2016-01-02 NOTE — MAU Note (Signed)
Pt seen at Urgent Care previously today, began having RLQ pain 2 weeks ago that radiates into R hip.  Was told she had cyst @ [facility] & was told to come here.  Pt came to MAU 3 days ago & was told the cyst was gone.  Pain continues so she went to [facility] & they told her to return to MAU.  Denies bleeding, has some vomiting & diarrhea today.  Vomited x 5 today, has had diarrhea twice.

## 2016-01-02 NOTE — ED Provider Notes (Signed)
CSN: [id number]     Arrival date & time 01/02/16  1315 History   First MD Initiated Contact with Patient 01/02/16 1615     Chief Complaint  Patient presents with  . Flank Pain   (Consider location/radiation/quality/duration/timing/severity/associated sxs/prior Treatment) HPI Comments: 30 year old Hispanic female complaining of right pelvic pain. It is associated with some nausea and vomiting and right flank/back pain. She has been seen 3 times for this before in the emergency department and once by OB/GYN. She has had a CT of the abdomen and pelvis as well as trans-and intravaginal ultrasounds. The only abnormality documented is a slight increase in the size of the right ovary. It was presumed that this was the etiology of her pain. She has been treated with analgesics including hydrocodone ,however the medication is not working. She states that her pain is still severe and she is requesting pain relief. The etiology of her pain is uncertain. She continues to have pain in her right back with marked tenderness and reproducible pain with flexion at the waist. Denies vaginal bleeding or discharge.  Patient is a 30 y.o. female presenting with flank pain.  Flank Pain Associated symptoms include abdominal pain. Pertinent negatives include no chest pain.    Past Medical History  Diagnosis Date  . Diverticulitis   . Pyelonephritis   . Pyelonephritis   . Asthma    Past Surgical History  Procedure Laterality Date  . Cholecystectomy    . Cesarean section    . Cesarean section N/A 2006  . Cesarean section N/A 09/03/2014    Procedure: CESAREAN SECTION;  Surgeon: Guss Bunde, MD;  Location: [facility];  Service: Obstetrics;  Laterality: N/A;  . Tubal ligation     Family History  Problem Relation Age of Onset  . Hyperlipidemia Mother   . Diabetes Maternal Uncle   . Diabetes Paternal Grandmother    Social History  Substance Use Topics  . Smoking status: Never Smoker   . Smokeless tobacco:  Never Used  . Alcohol Use: No   OB History    Gravida Para Term Preterm AB TAB SAB Ectopic Multiple Living   5 3 1 2 2  2  1 2      Review of Systems  Constitutional: Positive for activity change and appetite change. Negative for fever and chills.  HENT: Negative.   Respiratory: Negative.   Cardiovascular: Negative for chest pain.  Gastrointestinal: Positive for nausea, vomiting and abdominal pain.  Genitourinary: Positive for flank pain and pelvic pain. Negative for urgency, vaginal bleeding and vaginal discharge.  Musculoskeletal: Positive for myalgias and back pain.  Skin: Negative.   Neurological: Negative.     Allergies  Review of patient's allergies indicates no known allergies.  Home Medications   Prior to Admission medications   Medication Sig Start Date End Date Taking? Authorizing Provider  albuterol (PROVENTIL HFA;VENTOLIN HFA) 108 (90 BASE) MCG/ACT inhaler Inhale 4 puffs into the lungs every 6 (six) hours as needed for wheezing or shortness of breath. 08/13/14   Mariel Aloe, MD  HYDROcodone-acetaminophen (NORCO/VICODIN) 5-325 MG tablet Take 1-2 tablets by mouth every 6 (six) hours as needed for moderate pain or severe pain. 12/23/15   Waynetta Pean, PA-C  naproxen (NAPROSYN) 250 MG tablet Take 1 tablet (250 mg total) by mouth 2 (two) times daily with a meal. 12/23/15   Waynetta Pean, PA-C  ondansetron (ZOFRAN ODT) 4 MG disintegrating tablet Take 1 tablet (4 mg total) by mouth every 8 (eight) hours as  needed for nausea or vomiting. 12/23/15   Waynetta Pean, PA-C  oxyCODONE-acetaminophen (PERCOCET/ROXICET) 5-325 MG tablet Take 1-2 tablets by mouth every 6 (six) hours as needed. 12/30/15   Lattie Haw A Leftwich-Kirby, CNM  promethazine (PHENERGAN) 25 MG tablet Take 1 tablet (25 mg total) by mouth every 6 (six) hours as needed for nausea or vomiting. 12/27/15   Orpah Greek, MD  promethazine (PHENERGAN) 25 MG tablet Take 0.5-1 tablets (12.5-25 mg total) by mouth every 6 (six)  hours as needed for nausea. 12/30/15   Kathie Dike Leftwich-Kirby, CNM   Meds Ordered and Administered this Visit  Medications - No data to display  BP 107/95 mmHg  Pulse 76  Temp(Src) 98.7 F (37.1 C) (Oral)  Resp 16  SpO2 100%  LMP 12/11/2015 No data found.   Physical Exam  Constitutional: She is oriented to person, place, and time. She appears well-developed and well-nourished. No distress.  Eyes: EOM are normal.  Neck: Normal range of motion. Neck supple.  Cardiovascular: Normal rate, regular rhythm and normal heart sounds.   Pulmonary/Chest: Effort normal and breath sounds normal. No respiratory distress. She has no wheezes. She has no rales.  Abdominal: Bowel sounds are normal. She exhibits no mass.  Marked tenderness to the right pelvis.  Musculoskeletal: She exhibits no edema.  There is marked tenderness in the right low back musculature as well as the musculature of the right para thoracic and lumbar muscles.the patient is exacerbated when having her leaned forward and to the left.  Neurological: She is alert and oriented to person, place, and time. She exhibits normal muscle tone.  Skin: Skin is warm and dry. She is not diaphoretic.  Psychiatric: She has a normal mood and affect.  Nursing note and vitals reviewed.   ED Course  Procedures (including critical care time)  Labs Review Labs Reviewed - No data to display  Imaging Review No results found.   Visual Acuity Review  Right Eye Distance:   Left Eye Distance:   Bilateral Distance:    Right Eye Near:   Left Eye Near:    Bilateral Near:         MDM   1. Pelvic pain in female   2. Right-sided low back pain without sciatica     This is the fourth visit to either the emergency department GYN or urgent care for this patient with right pelvic pain and back or flank pain. Associated with nausea and vomiting. The patient is not having vomiting in the urgent care.Her primary complaint is that of the right  pelvicpain. It is not relieved by OTC hydrocodone and Naprosyn.she states it is more severe.She states that she was advised that if the pain were to become worse that she was to follow-up in the emergency department. She states that she thought this was the emergency department. According to documentation from the OB/GYNshe was advised to go to [facility] if the pain was worse. Patient is stable, vital signs are stable and she will be discharged to go to the [facility] now.  Verbal and written discharge instructions have been reviewed and given to the patient  by the provider to include medications and other care measures.   Janne Napoleon, NP 01/02/16 1714

## 2016-01-04 ENCOUNTER — Emergency Department (HOSPITAL_COMMUNITY)
Admission: EM | Admit: 2016-01-04 | Discharge: 2016-01-04 | Disposition: A | Discharge status: Home / Self Care | Payer: Medicaid Other | Attending: Emergency Medicine | Admitting: Emergency Medicine

## 2016-01-04 ENCOUNTER — Encounter (HOSPITAL_COMMUNITY): Payer: Self-pay | Admitting: Emergency Medicine

## 2016-01-04 DIAGNOSIS — Z9851 Tubal ligation status: Secondary | ICD-10-CM | POA: Insufficient documentation

## 2016-01-04 DIAGNOSIS — Z9889 Other specified postprocedural states: Secondary | ICD-10-CM | POA: Insufficient documentation

## 2016-01-04 DIAGNOSIS — Z8719 Personal history of other diseases of the digestive system: Secondary | ICD-10-CM | POA: Insufficient documentation

## 2016-01-04 DIAGNOSIS — R1111 Vomiting without nausea: Secondary | ICD-10-CM

## 2016-01-04 DIAGNOSIS — Z9049 Acquired absence of other specified parts of digestive tract: Secondary | ICD-10-CM | POA: Diagnosis not present

## 2016-01-04 DIAGNOSIS — R1030 Lower abdominal pain, unspecified: Secondary | ICD-10-CM | POA: Diagnosis not present

## 2016-01-04 DIAGNOSIS — J45909 Unspecified asthma, uncomplicated: Secondary | ICD-10-CM | POA: Diagnosis not present

## 2016-01-04 DIAGNOSIS — Z791 Long term (current) use of non-steroidal anti-inflammatories (NSAID): Secondary | ICD-10-CM | POA: Insufficient documentation

## 2016-01-04 DIAGNOSIS — Z87448 Personal history of other diseases of urinary system: Secondary | ICD-10-CM | POA: Insufficient documentation

## 2016-01-04 DIAGNOSIS — R109 Unspecified abdominal pain: Secondary | ICD-10-CM

## 2016-01-04 DIAGNOSIS — R1110 Vomiting, unspecified: Secondary | ICD-10-CM | POA: Diagnosis present

## 2016-01-04 DIAGNOSIS — Z79899 Other long term (current) drug therapy: Secondary | ICD-10-CM | POA: Diagnosis not present

## 2016-01-04 DIAGNOSIS — R112 Nausea with vomiting, unspecified: Secondary | ICD-10-CM | POA: Diagnosis not present

## 2016-01-04 NOTE — ED Notes (Signed)
Translator Ipad used. Pt states she had been taking both nausea med and pain med at the same time. Informed that she should use it differently by NP.

## 2016-01-04 NOTE — ED Notes (Signed)
Lower abdominal pain, vomiting and dizziness x 2 weeks. Some pain with urination, last period was 3/26 and says there's no chance she could be pregnant. Small amount of diarrhea also

## 2016-01-04 NOTE — ED Provider Notes (Signed)
CSN: [id number]     Arrival date & time 01/04/16  1043 History   First MD Initiated Contact with Patient 01/04/16 1049     Chief Complaint  Patient presents with  . Abdominal Pain  . Emesis     (Consider location/radiation/quality/duration/timing/severity/associated sxs/prior Treatment) HPI Comments: Pt comes in with c/o vomiting and abdominal pain. She states that she has been being seen with abdominal pain and vomiting. She has had ct and [place] in the last week. No fever. She states that she has pain medication and antiemetic. She states that she can't keep down the pain medication. She is taking the medication at the same time. She states that the pain hasn't changed she just needs something to control the pain. Pt states that she vomited two times today  The history is provided by the patient. No language interpreter was used.    Past Medical History  Diagnosis Date  . Diverticulitis   . Pyelonephritis   . Pyelonephritis   . Asthma     Inhaler used 01/02/16   Past Surgical History  Procedure Laterality Date  . Cholecystectomy    . Cesarean section    . Cesarean section N/A 2006  . Cesarean section N/A 09/03/2014    Procedure: CESAREAN SECTION;  Surgeon: Guss Bunde, MD;  Location: [facility];  Service: Obstetrics;  Laterality: N/A;  . Tubal ligation     Family History  Problem Relation Age of Onset  . Hyperlipidemia Mother   . Diabetes Maternal Uncle   . Diabetes Paternal Grandmother    Social History  Substance Use Topics  . Smoking status: Never Smoker   . Smokeless tobacco: Never Used  . Alcohol Use: No   OB History    Gravida Para Term Preterm AB TAB SAB Ectopic Multiple Living   5 3 1 2 2  2  1 2      Review of Systems  All other systems reviewed and are negative.     Allergies  Review of patient's allergies indicates no known allergies.  Home Medications   Prior to Admission medications   Medication Sig Start Date End Date Taking? Authorizing  Provider  albuterol (PROVENTIL HFA;VENTOLIN HFA) 108 (90 BASE) MCG/ACT inhaler Inhale 4 puffs into the lungs every 6 (six) hours as needed for wheezing or shortness of breath. 08/13/14   Mariel Aloe, MD  HYDROcodone-acetaminophen (NORCO/VICODIN) 5-325 MG tablet Take 1-2 tablets by mouth every 6 (six) hours as needed for moderate pain or severe pain. 12/23/15   Waynetta Pean, PA-C  naproxen (NAPROSYN) 250 MG tablet Take 1 tablet (250 mg total) by mouth 2 (two) times daily with a meal. 12/23/15   Waynetta Pean, PA-C  ondansetron (ZOFRAN ODT) 4 MG disintegrating tablet Take 1 tablet (4 mg total) by mouth every 8 (eight) hours as needed for nausea or vomiting. 12/23/15   Waynetta Pean, PA-C  oxyCODONE-acetaminophen (PERCOCET/ROXICET) 5-325 MG tablet Take 1 tablet by mouth every 4 (four) hours as needed for severe pain. 01/02/16   Keitha Butte, CNM  promethazine (PHENERGAN) 25 MG tablet Take 1 tablet (25 mg total) by mouth every 6 (six) hours as needed for nausea or vomiting. 12/27/15   Orpah Greek, MD  promethazine (PHENERGAN) 25 MG tablet Take 0.5-1 tablets (12.5-25 mg total) by mouth every 6 (six) hours as needed for nausea. 12/30/15   Lattie Haw A Leftwich-Kirby, CNM   BP 128/82 mmHg  Pulse 75  Temp(Src) 97.9 F (36.6 C) (Oral)  Resp 18  SpO2 100%  LMP 12/11/2015 Physical Exam  Constitutional: She is oriented to person, place, and time. She appears well-developed and well-nourished.  Cardiovascular: Normal rate and regular rhythm.   Pulmonary/Chest: Effort normal and breath sounds normal.  Abdominal: Soft. Bowel sounds are normal.  Mild lower abdominal tenderness  Musculoskeletal: Normal range of motion.  Neurological: She is alert and oriented to person, place, and time.  Skin: Skin is warm and dry.  Psychiatric: She has a normal mood and affect.  Nursing note and vitals reviewed.   ED Course  Procedures (including critical care time) Labs Review Labs Reviewed  URINALYSIS, ROUTINE  W REFLEX MICROSCOPIC (NOT AT [facility])  PREGNANCY, URINE    Imaging Review No results found. I have personally reviewed and evaluated these images and lab results as part of my medical decision-making.   EKG Interpretation None      MDM   Final diagnoses:  Abdominal pain, unspecified abdominal location  Non-intractable vomiting without nausea, vomiting of unspecified type    Discussed with pt that she needs to take the antiemetic first and then the pain medication 30 minutes later. Pt verbalized understanding   Glendell Docker, NP 01/04/16 1159  Merrily Pew, MD 01/04/16 1723

## 2016-01-04 NOTE — Discharge Instructions (Signed)
Dolor abdominal en adultos  (Abdominal Pain, Adult)  El dolor de estómago (abdominal) puede tener muchas causas. La mayoría de las veces, el dolor de estómago no es peligroso. Muchos de estos casos de dolor de estómago pueden controlarse y tratarse en casa.  CUIDADOS EN EL HOGAR   · No tome medicamentos que lo ayuden a defecar (laxantes), salvo que su médico se lo indique.  · Solo tome los medicamentos que le haya indicado su médico.  · Coma o beba lo que le indique su médico. Su médico le dirá si debe seguir una dieta especial.  SOLICITE AYUDA SI:  · No sabe cuál es la causa del dolor de estómago.  · Tiene dolor de estómago cuando siente ganas de vomitar (náuseas) o tiene colitis (diarrea).  · Tiene dolor durante la micción o la evacuación.  · El dolor de estómago lo despierta de noche.  · Tiene dolor de estómago que empeora o mejora cuando come.  · Tiene dolor de estómago que empeora cuando come alimentos grasosos.  · Tiene fiebre.  SOLICITE AYUDA DE INMEDIATO SI:   · El dolor no desaparece en un plazo máximo de 2 horas.  · No deja de (vomitar).  · El dolor cambia y se localiza solo en la parte derecha o izquierda del estómago.  · La materia fecal es sanguinolenta o de aspecto alquitranado.  ASEGÚRESE DE QUE:   · Comprende estas instrucciones.  · Controlará su afección.  · Recibirá ayuda de inmediato si no mejora o si empeora.     Esta información no tiene como fin reemplazar el consejo del médico. Asegúrese de hacerle al médico cualquier pregunta que tenga.     Document Released: 11/30/2008 Document Revised: 09/24/2014  Elsevier Interactive Patient Education ©2016 [employer]

## 2016-01-16 ENCOUNTER — Emergency Department (HOSPITAL_COMMUNITY)
Admission: EM | Admit: 2016-01-16 | Discharge: 2016-01-16 | Disposition: A | Discharge status: Home / Self Care | Payer: Medicaid Other | Attending: Emergency Medicine | Admitting: Emergency Medicine

## 2016-01-16 ENCOUNTER — Emergency Department (HOSPITAL_COMMUNITY): Payer: Medicaid Other

## 2016-01-16 ENCOUNTER — Encounter (HOSPITAL_COMMUNITY): Payer: Self-pay | Admitting: Emergency Medicine

## 2016-01-16 DIAGNOSIS — Z9049 Acquired absence of other specified parts of digestive tract: Secondary | ICD-10-CM | POA: Insufficient documentation

## 2016-01-16 DIAGNOSIS — Z8719 Personal history of other diseases of the digestive system: Secondary | ICD-10-CM | POA: Diagnosis not present

## 2016-01-16 DIAGNOSIS — Z87448 Personal history of other diseases of urinary system: Secondary | ICD-10-CM | POA: Insufficient documentation

## 2016-01-16 DIAGNOSIS — Z79899 Other long term (current) drug therapy: Secondary | ICD-10-CM | POA: Insufficient documentation

## 2016-01-16 DIAGNOSIS — Z9851 Tubal ligation status: Secondary | ICD-10-CM | POA: Insufficient documentation

## 2016-01-16 DIAGNOSIS — Z3202 Encounter for pregnancy test, result negative: Secondary | ICD-10-CM | POA: Insufficient documentation

## 2016-01-16 DIAGNOSIS — R05 Cough: Secondary | ICD-10-CM | POA: Diagnosis present

## 2016-01-16 DIAGNOSIS — R300 Dysuria: Secondary | ICD-10-CM | POA: Diagnosis not present

## 2016-01-16 DIAGNOSIS — J45901 Unspecified asthma with (acute) exacerbation: Secondary | ICD-10-CM | POA: Diagnosis not present

## 2016-01-16 DIAGNOSIS — Z9889 Other specified postprocedural states: Secondary | ICD-10-CM | POA: Diagnosis not present

## 2016-01-16 DIAGNOSIS — R112 Nausea with vomiting, unspecified: Secondary | ICD-10-CM | POA: Diagnosis not present

## 2016-01-16 DIAGNOSIS — J069 Acute upper respiratory infection, unspecified: Secondary | ICD-10-CM | POA: Insufficient documentation

## 2016-01-16 DIAGNOSIS — R1031 Right lower quadrant pain: Secondary | ICD-10-CM | POA: Insufficient documentation

## 2016-01-16 DIAGNOSIS — G8929 Other chronic pain: Secondary | ICD-10-CM | POA: Diagnosis not present

## 2016-01-16 LAB — CBC WITH DIFFERENTIAL/PLATELET
Basophils Absolute: 0 10*3/uL (ref 0.0–0.1)
Basophils Relative: 0 %
EOS PCT: 6 %
Eosinophils Absolute: 0.5 10*3/uL (ref 0.0–0.7)
HEMATOCRIT: 37.6 % (ref 36.0–46.0)
Hemoglobin: 13 g/dL (ref 12.0–15.0)
LYMPHS ABS: 2.5 10*3/uL (ref 0.7–4.0)
LYMPHS PCT: 31 %
MCH: 29.6 pg (ref 26.0–34.0)
MCHC: 34.6 g/dL (ref 30.0–36.0)
MCV: 85.6 fL (ref 78.0–100.0)
MONO ABS: 0.4 10*3/uL (ref 0.1–1.0)
Monocytes Relative: 5 %
Neutro Abs: 4.6 10*3/uL (ref 1.7–7.7)
Neutrophils Relative %: 58 %
PLATELETS: 315 10*3/uL (ref 150–400)
RBC: 4.39 MIL/uL (ref 3.87–5.11)
RDW: 13 % (ref 11.5–15.5)
WBC: 8 10*3/uL (ref 4.0–10.5)

## 2016-01-16 LAB — URINALYSIS, ROUTINE W REFLEX MICROSCOPIC
BILIRUBIN URINE: NEGATIVE
Glucose, UA: NEGATIVE mg/dL
KETONES UR: 15 mg/dL — AB
Leukocytes, UA: NEGATIVE
NITRITE: NEGATIVE
PROTEIN: NEGATIVE mg/dL
Specific Gravity, Urine: 1.029 (ref 1.005–1.030)
pH: 6.5 (ref 5.0–8.0)

## 2016-01-16 LAB — COMPREHENSIVE METABOLIC PANEL
ALK PHOS: 89 U/L (ref 38–126)
ALT: 54 U/L (ref 14–54)
AST: 53 U/L — ABNORMAL HIGH (ref 15–41)
Albumin: 4.4 g/dL (ref 3.5–5.0)
Anion gap: 10 (ref 5–15)
BILIRUBIN TOTAL: 0.6 mg/dL (ref 0.3–1.2)
BUN: 14 mg/dL (ref 6–20)
CALCIUM: 9.2 mg/dL (ref 8.9–10.3)
CHLORIDE: 110 mmol/L (ref 101–111)
CO2: 23 mmol/L (ref 22–32)
CREATININE: 0.78 mg/dL (ref 0.44–1.00)
Glucose, Bld: 101 mg/dL — ABNORMAL HIGH (ref 65–99)
Potassium: 3.4 mmol/L — ABNORMAL LOW (ref 3.5–5.1)
Sodium: 143 mmol/L (ref 135–145)
TOTAL PROTEIN: 8.1 g/dL (ref 6.5–8.1)

## 2016-01-16 LAB — URINE MICROSCOPIC-ADD ON: Bacteria, UA: NONE SEEN

## 2016-01-16 LAB — POC URINE PREG, ED: PREG TEST UR: NEGATIVE

## 2016-01-16 MED ORDER — ALBUTEROL SULFATE HFA 108 (90 BASE) MCG/ACT IN AERS
1.0000 | INHALATION_SPRAY | RESPIRATORY_TRACT | Status: DC | PRN
  Administered 2016-01-16: 2 via RESPIRATORY_TRACT
  Filled 2016-01-16: qty 6.7

## 2016-01-16 MED ORDER — PREDNISONE 20 MG PO TABS: 40.0000 mg | ORAL_TABLET | Freq: Every day | ORAL | Status: DC

## 2016-01-16 MED ORDER — IPRATROPIUM-ALBUTEROL 0.5-2.5 (3) MG/3ML IN SOLN
3.0000 mL | Freq: Once | RESPIRATORY_TRACT | Status: AC
  Administered 2016-01-16: 3 mL via RESPIRATORY_TRACT
  Filled 2016-01-16: qty 3

## 2016-01-16 MED ORDER — ALBUTEROL SULFATE (2.5 MG/3ML) 0.083% IN NEBU
5.0000 mg | INHALATION_SOLUTION | Freq: Once | RESPIRATORY_TRACT | Status: AC
  Administered 2016-01-16: 5 mg via RESPIRATORY_TRACT
  Filled 2016-01-16: qty 6

## 2016-01-16 MED ORDER — KETOROLAC TROMETHAMINE 60 MG/2ML IM SOLN
30.0000 mg | Freq: Once | INTRAMUSCULAR | Status: AC
  Administered 2016-01-16: 30 mg via INTRAMUSCULAR
  Filled 2016-01-16: qty 2

## 2016-01-16 MED ORDER — KETOROLAC TROMETHAMINE 15 MG/ML IJ SOLN: 15.0000 mg | Freq: Once | INTRAMUSCULAR | Status: DC

## 2016-01-16 NOTE — ED Provider Notes (Addendum)
CSN: [id number]     Arrival date & time 01/16/16  1745 History   First MD Initiated Contact with Patient 01/16/16 2019     Chief Complaint  Patient presents with  . Chest Pain  . Cough     (Consider location/radiation/quality/duration/timing/severity/associated sxs/prior Treatment) HPI Comments: Patient is also complaining of right lower quadrant pain. She states she's been having this pain for 2 weeks but is been ongoing for some time. She was recently seen on April 17 for the same pain at [facility] hospital and that time had a normal ultrasound and a negative CT without findings for the pain. She is following up with OB/GYN.  Patient is a 30 y.o. female presenting with chest pain and cough. The history is provided by the patient.  Chest Pain Pain location:  Substernal area Pain quality: aching, sharp and shooting   Pain radiates to:  Does not radiate Pain radiates to the back: no   Pain severity:  Moderate Onset quality:  Gradual Duration:  1 week Timing:  Intermittent Progression:  Worsening Chronicity:  New Context comment:  Only hurts with coughing.   Relieved by:  None tried Worsened by:  Coughing Ineffective treatments:  None tried Associated symptoms: abdominal pain, cough, fever, nausea, shortness of breath and vomiting   Associated symptoms: no headache, no lower extremity edema and no palpitations   Associated symptoms comment:  The last 1 week patient has had URI symptoms including nasal congestion, fever starting yesterday and dry cough. The cough is persistent she's unable to sleep at night and is now having chest discomfort due to all the coughing. She denies any sore throat. Multiple children in the room with similar symptoms. Abdominal pain:    Location:  RLQ   Quality:  Aching, sharp and shooting   Severity:  Moderate   Onset quality:  Gradual   Duration:  2 weeks   Timing:  Constant   Progression:  Unchanged   Chronicity:  Chronic Risk factors: obesity   Risk  factors: no diabetes mellitus, no hypertension, no immobilization, not pregnant and no smoking   Cough Associated symptoms: chest pain, fever and shortness of breath   Associated symptoms: no headaches     Past Medical History  Diagnosis Date  . Diverticulitis   . Pyelonephritis   . Pyelonephritis   . Asthma     Inhaler used 01/02/16   Past Surgical History  Procedure Laterality Date  . Cholecystectomy    . Cesarean section    . Cesarean section N/A 2006  . Cesarean section N/A 09/03/2014    Procedure: CESAREAN SECTION;  Surgeon: Guss Bunde, MD;  Location: [facility];  Service: Obstetrics;  Laterality: N/A;  . Tubal ligation     Family History  Problem Relation Age of Onset  . Hyperlipidemia Mother   . Diabetes Maternal Uncle   . Diabetes Paternal Grandmother    Social History  Substance Use Topics  . Smoking status: Never Smoker   . Smokeless tobacco: Never Used  . Alcohol Use: No   OB History    Gravida Para Term Preterm AB TAB SAB Ectopic Multiple Living   5 3 1 2 2  2  1 2      Review of Systems  Constitutional: Positive for fever.  Respiratory: Positive for cough and shortness of breath.   Cardiovascular: Positive for chest pain. Negative for palpitations.  Gastrointestinal: Positive for nausea, vomiting and abdominal pain.  Genitourinary: Positive for dysuria. Negative for difficulty urinating.  Neurological: Negative for headaches.  All other systems reviewed and are negative.     Allergies  Review of patient's allergies indicates no known allergies.  Home Medications   Prior to Admission medications   Medication Sig Start Date End Date Taking? Authorizing Provider  HYDROcodone-acetaminophen (NORCO/VICODIN) 5-325 MG tablet Take 1-2 tablets by mouth every 6 (six) hours as needed for moderate pain or severe pain. 12/23/15  Yes Waynetta Pean, PA-C  oxyCODONE-acetaminophen (PERCOCET/ROXICET) 5-325 MG tablet Take 1 tablet by mouth every 4 (four) hours as  needed for severe pain. 01/02/16  Yes Keitha Butte, CNM  albuterol (PROVENTIL HFA;VENTOLIN HFA) 108 (90 BASE) MCG/ACT inhaler Inhale 4 puffs into the lungs every 6 (six) hours as needed for wheezing or shortness of breath. 08/13/14   Mariel Aloe, MD  naproxen (NAPROSYN) 250 MG tablet Take 1 tablet (250 mg total) by mouth 2 (two) times daily with a meal. Patient not taking: Reported on 01/16/2016 12/23/15   Waynetta Pean, PA-C  ondansetron (ZOFRAN ODT) 4 MG disintegrating tablet Take 1 tablet (4 mg total) by mouth every 8 (eight) hours as needed for nausea or vomiting. 12/23/15   Waynetta Pean, PA-C  promethazine (PHENERGAN) 25 MG tablet Take 1 tablet (25 mg total) by mouth every 6 (six) hours as needed for nausea or vomiting. Patient not taking: Reported on 01/16/2016 12/27/15   Orpah Greek, MD  promethazine (PHENERGAN) 25 MG tablet Take 0.5-1 tablets (12.5-25 mg total) by mouth every 6 (six) hours as needed for nausea. 12/30/15   Lisa A Leftwich-Kirby, CNM   BP 138/100 mmHg  Pulse 96  Temp(Src) 98.3 F (36.8 C) (Oral)  Resp 20  SpO2 98%  LMP 01/16/2016 Physical Exam  Constitutional: She is oriented to person, place, and time. She appears well-developed and well-nourished. No distress.  HENT:  Head: Normocephalic and atraumatic.  Nose: Mucosal edema present.  Mouth/Throat: Oropharynx is clear and moist and mucous membranes are normal. No oropharyngeal exudate, posterior oropharyngeal edema or posterior oropharyngeal erythema.  Eyes: Conjunctivae and EOM are normal. Pupils are equal, round, and reactive to light.  Neck: Normal range of motion. Neck supple.  Cardiovascular: Normal rate, regular rhythm and intact distal pulses.   No murmur heard. Pulmonary/Chest: Effort normal. No respiratory distress. She has wheezes. She has no rales.  Abdominal: Soft. She exhibits no distension. There is tenderness in the right lower quadrant and suprapubic area. There is no rebound, no guarding and  no CVA tenderness.  Musculoskeletal: Normal range of motion. She exhibits no edema or tenderness.  Neurological: She is alert and oriented to person, place, and time.  Skin: Skin is warm and dry. No rash noted. No erythema.  Psychiatric: She has a normal mood and affect. Her behavior is normal.  Nursing note and vitals reviewed.   ED Course  Procedures (including critical care time) Labs Review Labs Reviewed  COMPREHENSIVE METABOLIC PANEL - Abnormal; Notable for the following:    Potassium 3.4 (*)    Glucose, Bld 101 (*)    AST 53 (*)    All other components within normal limits  URINALYSIS, ROUTINE W REFLEX MICROSCOPIC (NOT AT [facility]) - Abnormal; Notable for the following:    Hgb urine dipstick MODERATE (*)    Ketones, ur 15 (*)    All other components within normal limits  URINE MICROSCOPIC-ADD ON - Abnormal; Notable for the following:    Squamous Epithelial / LPF 0-5 (*)    All other components within normal limits  CBC WITH DIFFERENTIAL/PLATELET  POC URINE PREG, ED    Imaging Review Dg Chest 2 View  01/16/2016  CLINICAL DATA:  One-week history of vomiting. Chest pain since yesterday. EXAM: CHEST  2 VIEW COMPARISON:  09/21/2015. FINDINGS: The cardiac silhouette, mediastinal and hilar contours are within normal limits and stable. The lungs are clear. Stable mild eventration of the hemidiaphragm. The bony thorax is intact. IMPRESSION: No acute cardiopulmonary findings. Electronically Signed   By: Marijo Sanes M.D.   On: 01/16/2016 18:53   I have personally reviewed and evaluated these images and lab results as part of my medical decision-making.   EKG Interpretation   Date/Time:  Monday Jan 16 2016 18:27:17 EDT Ventricular Rate:  91 PR Interval:  137 QRS Duration: 98 QT Interval:  355 QTC Calculation: 437 R Axis:   47 Text Interpretation:  Sinus rhythm Low voltage, precordial leads Baseline  wander in lead(s) III No significant change since last tracing Confirmed  by  Maryan Rued  MD, Loree Fee ([id number]) on 01/16/2016 8:34:41 PM      MDM   Final diagnoses:  Chronic RLQ pain  URI (upper respiratory infection)    Patient is a 30 year old female presenting today with URI symptoms including cough, congestion and chest pain from cough. She states that this is been ongoing for 1 week and yesterday she started to develop fever. She denies tobacco use and no history of asthma. Patient had a chest x-ray done injury eyes show which showed no acute findings and EKG is within normal limits. Feel most likely that this is a URI patient does have some wheezing on exam but is satting 98% on room air. Given albuterol.  Secondly patient is complaining of right lower quadrant pain. She states this time and is been going on for 2 weeks with intermittent vomiting but no diarrhea. Patient has been seen multiple times in the month of April for the same pain. She's had workup with CT and ultrasound which were both negative. OB/GYN is following up. When questioned about this she states the pain today is really no different than the pain she's been experiencing. She does complain of some mild dysuria and states her last menses was in March but she has had a tubal ligation. Urine and pregnancy test done 2 weeks ago were negative. However will get CBC, CMP, UA and UPT. If all these are normal based on prior notes and prior history do not feel that this needs further evaluation in the ED but definitely needs outpatient follow-up.  11:08 PM Patient's labs without acute finding. Patient is feeling better after treatment. Will DC home with medications.   Blanchie Dessert, MD 01/16/16 [id number]  Blanchie Dessert, MD 01/16/16 2310

## 2016-01-16 NOTE — ED Notes (Signed)
Pt c/o bilateral chest pain since yesterday and feeling faint. Dry cough present at triage and states she had a fever yesterday. No meds taken. Alert and orient.

## 2016-06-13 ENCOUNTER — Encounter (HOSPITAL_COMMUNITY): Payer: Self-pay | Admitting: *Deleted

## 2016-06-13 ENCOUNTER — Emergency Department (HOSPITAL_COMMUNITY): Payer: Medicaid Other

## 2016-06-13 ENCOUNTER — Emergency Department (HOSPITAL_COMMUNITY)
Admission: EM | Admit: 2016-06-13 | Discharge: 2016-06-13 | Disposition: A | Discharge status: Home / Self Care | Payer: Medicaid Other | Attending: Emergency Medicine | Admitting: Emergency Medicine

## 2016-06-13 DIAGNOSIS — M549 Dorsalgia, unspecified: Secondary | ICD-10-CM | POA: Diagnosis not present

## 2016-06-13 DIAGNOSIS — J45909 Unspecified asthma, uncomplicated: Secondary | ICD-10-CM | POA: Diagnosis not present

## 2016-06-13 DIAGNOSIS — R1011 Right upper quadrant pain: Secondary | ICD-10-CM

## 2016-06-13 DIAGNOSIS — M5489 Other dorsalgia: Secondary | ICD-10-CM

## 2016-06-13 LAB — CBC
HCT: 38 % (ref 36.0–46.0)
Hemoglobin: 12.4 g/dL (ref 12.0–15.0)
MCH: 29.5 pg (ref 26.0–34.0)
MCHC: 32.6 g/dL (ref 30.0–36.0)
MCV: 90.5 fL (ref 78.0–100.0)
PLATELETS: 260 10*3/uL (ref 150–400)
RBC: 4.2 MIL/uL (ref 3.87–5.11)
RDW: 12.7 % (ref 11.5–15.5)
WBC: 8.3 10*3/uL (ref 4.0–10.5)

## 2016-06-13 LAB — BASIC METABOLIC PANEL
Anion gap: 9 (ref 5–15)
BUN: 17 mg/dL (ref 6–20)
CALCIUM: 9.2 mg/dL (ref 8.9–10.3)
CO2: 26 mmol/L (ref 22–32)
CREATININE: 0.77 mg/dL (ref 0.44–1.00)
Chloride: 105 mmol/L (ref 101–111)
Glucose, Bld: 107 mg/dL — ABNORMAL HIGH (ref 65–99)
Potassium: 3.8 mmol/L (ref 3.5–5.1)
Sodium: 140 mmol/L (ref 135–145)

## 2016-06-13 LAB — HEPATIC FUNCTION PANEL
ALK PHOS: 91 U/L (ref 38–126)
ALT: 39 U/L (ref 14–54)
AST: 27 U/L (ref 15–41)
Albumin: 3.9 g/dL (ref 3.5–5.0)
Bilirubin, Direct: <0.1 mg/dL — ABNORMAL LOW (ref 0.1–0.5)
TOTAL PROTEIN: 7 g/dL (ref 6.5–8.1)
Total Bilirubin: 0.6 mg/dL (ref 0.3–1.2)

## 2016-06-13 LAB — URINALYSIS, ROUTINE W REFLEX MICROSCOPIC
BILIRUBIN URINE: NEGATIVE
Glucose, UA: NEGATIVE mg/dL
Hgb urine dipstick: NEGATIVE
KETONES UR: 15 mg/dL — AB
Leukocytes, UA: NEGATIVE
NITRITE: NEGATIVE
PH: 6 (ref 5.0–8.0)
PROTEIN: NEGATIVE mg/dL
Specific Gravity, Urine: 1.034 — ABNORMAL HIGH (ref 1.005–1.030)

## 2016-06-13 LAB — POC URINE PREG, ED: PREG TEST UR: NEGATIVE

## 2016-06-13 LAB — LIPASE, BLOOD: LIPASE: 21 U/L (ref 11–51)

## 2016-06-13 MED ORDER — ONDANSETRON HCL 4 MG/2ML IJ SOLN
4.0000 mg | Freq: Once | INTRAMUSCULAR | Status: AC
  Administered 2016-06-13: 4 mg via INTRAVENOUS
  Filled 2016-06-13: qty 2

## 2016-06-13 MED ORDER — ONDANSETRON 4 MG PO TBDP: 4.0000 mg | ORAL_TABLET | Freq: Three times a day (TID) | ORAL | 0 refills | Status: DC | PRN

## 2016-06-13 MED ORDER — KETOROLAC TROMETHAMINE 30 MG/ML IJ SOLN
30.0000 mg | Freq: Once | INTRAMUSCULAR | Status: AC
  Administered 2016-06-13: 30 mg via INTRAVENOUS
  Filled 2016-06-13: qty 1

## 2016-06-13 MED ORDER — TRAMADOL HCL 50 MG PO TABS: 50.0000 mg | ORAL_TABLET | Freq: Four times a day (QID) | ORAL | 0 refills | Status: DC | PRN

## 2016-06-13 NOTE — ED Provider Notes (Signed)
[facility] DEPT Provider Note   CSN: [id number] Arrival date & time: 06/13/16  1158     History   Chief Complaint Chief Complaint  Patient presents with  . Back Pain    HPI Henli Murdy is a 30 y.o. female.  The history is provided by the patient and medical records. No language interpreter was used.  Back Pain   Associated symptoms include abdominal pain. Pertinent negatives include no fever, no headaches and no dysuria.   Denaysha Koning is a 30 y.o. female  who presents to the Emergency Department for gradually worsening left sided back pain x 2 weeks. No known injury. Pain worse with inspiration and movement. Ibuprofen taken with little relief. No fever/chills, cough, congestion, urinary symptoms, shortness of breath, chest pain, saddle anesthesia, numbness or tingling.   Upon further discussion, patient also complaining of intermittent RUQ abdominal pain for the last three to four days. Associated with nausea. No alleviating factors noted. Worse with palpation. No vomiting, diarrhea, constipation.   Past Medical History:  Diagnosis Date  . Asthma    Inhaler used 01/02/16  . Diverticulitis   . Pyelonephritis   . Pyelonephritis     Patient Active Problem List   Diagnosis Date Noted  . Pre-diabetes 07/15/2015  . Diarrhea   . Hypokalemia   . Bacterial vaginosis   . Pyelonephritis 07/07/2015  . Abdominal pain, right upper quadrant   . Asthma 10/31/2014  . History of preterm delivery 10/22/2014  . Status post repeat low transverse cesarean section 09/06/2014  . Diverticulitis 09/10/2012    Past Surgical History:  Procedure Laterality Date  . CESAREAN SECTION    . CESAREAN SECTION N/A 2006  . CESAREAN SECTION N/A 09/03/2014   Procedure: CESAREAN SECTION;  Surgeon: Guss Bunde, MD;  Location: [facility];  Service: Obstetrics;  Laterality: N/A;  . CHOLECYSTECTOMY    . TUBAL LIGATION      OB History    Gravida Para Term Preterm AB Living   5 3 1 2 2 2     SAB TAB Ectopic Multiple Live Births   2     1 2        Home Medications    Prior to Admission medications   Medication Sig Start Date End Date Taking? Authorizing Provider  albuterol (PROVENTIL HFA;VENTOLIN HFA) 108 (90 BASE) MCG/ACT inhaler Inhale 4 puffs into the lungs every 6 (six) hours as needed for wheezing or shortness of breath. 08/13/14  Yes Mariel Aloe, MD  ibuprofen (ADVIL,MOTRIN) 200 MG tablet Take 200 mg by mouth every 6 (six) hours as needed for moderate pain.   Yes Historical Provider, MD  naproxen (NAPROSYN) 250 MG tablet Take 1 tablet (250 mg total) by mouth 2 (two) times daily with a meal. Patient not taking: Reported on 06/13/2016 12/23/15   Waynetta Pean, PA-C  ondansetron (ZOFRAN ODT) 4 MG disintegrating tablet Take 1 tablet (4 mg total) by mouth every 8 (eight) hours as needed for nausea or vomiting. 06/13/16   Ozella Almond Ward, PA-C  predniSONE (DELTASONE) 20 MG tablet Take 2 tablets (40 mg total) by mouth daily. Patient not taking: Reported on 06/13/2016 01/16/16   Blanchie Dessert, MD  traMADol (ULTRAM) 50 MG tablet Take 1 tablet (50 mg total) by mouth every 6 (six) hours as needed. 06/13/16   Farrell, PA-C    Family History Family History  Problem Relation Age of Onset  . Hyperlipidemia Mother   . Diabetes Maternal Uncle   . Diabetes  Paternal Grandmother     Social History Social History  Substance Use Topics  . Smoking status: Never Smoker  . Smokeless tobacco: Never Used  . Alcohol use No     Allergies   Review of patient's allergies indicates no known allergies.   Review of Systems Review of Systems  Constitutional: Negative for chills and fever.  HENT: Negative for congestion.   Eyes: Negative for visual disturbance.  Respiratory: Negative for cough and shortness of breath.   Cardiovascular: Negative.   Gastrointestinal: Positive for abdominal pain and nausea. Negative for constipation, diarrhea and vomiting.  Genitourinary:  Negative for dysuria.  Musculoskeletal: Positive for back pain. Negative for neck pain.  Skin: Negative for rash.  Neurological: Negative for headaches.     Physical Exam Updated Vital Signs BP 115/83 (BP Location: Right Arm)   Pulse 72   Temp 98.3 F (36.8 C) (Oral)   Resp 23   Wt 86.6 kg   LMP 05/11/2016   SpO2 99%   BMI 34.93 kg/m   Physical Exam  Constitutional: She is oriented to person, place, and time. She appears well-developed and well-nourished. No distress.  HENT:  Head: Normocephalic and atraumatic.  Cardiovascular: Normal rate, regular rhythm and normal heart sounds.   No murmur heard. Pulmonary/Chest: Effort normal and breath sounds normal. No respiratory distress. She has no wheezes. She has no rales. She exhibits no tenderness.  Abdominal: Soft. Bowel sounds are normal. She exhibits no distension. There is tenderness.  TTP of RUQ. Negative Murphy's.   Musculoskeletal: Normal range of motion.       Arms: TTP as depicted in image. No midline tenderness.   5/5 muscle strength x 4.   Neurological: She is alert and oriented to person, place, and time.  Bilateral lower extremities neurovascularly intact.   Skin: Skin is warm and dry.  Nursing note and vitals reviewed.    ED Treatments / Results  Labs (all labs ordered are listed, but only abnormal results are displayed) Labs Reviewed  BASIC METABOLIC PANEL - Abnormal; Notable for the following:       Result Value   Glucose, Bld 107 (*)    All other components within normal limits  URINALYSIS, ROUTINE W REFLEX MICROSCOPIC (NOT AT [facility]) - Abnormal; Notable for the following:    Specific Gravity, Urine 1.034 (*)    Ketones, ur 15 (*)    All other components within normal limits  HEPATIC FUNCTION PANEL - Abnormal; Notable for the following:    Bilirubin, Direct <0.1 (*)    All other components within normal limits  CBC  LIPASE, BLOOD  POC URINE PREG, ED    EKG  EKG Interpretation None        Radiology Dg Chest 2 View  Result Date: 06/13/2016 CLINICAL DATA:  Two weeks of mid back pain without known injury, pain worse with inspiration. History of asthma, pyelonephritis. EXAM: CHEST  2 VIEW COMPARISON:  PA and lateral chest x-ray of Jan 16, 2016 FINDINGS: The lungs are borderline hypoinflated. The interstitial markings are coarse but some crowding is present. The heart and pulmonary vascularity are normal. The mediastinum is normal in width. There is no pleural effusion or pneumothorax. No abnormal paravertebral soft tissue densities are observed. The thoracic vertebral bodies are preserved in height. The observed ribs are unremarkable. IMPRESSION: Mild hypo inflation which limits the study. No acute pneumonia nor CHF. No acute or significant chronic bony abnormality of the thoracic spine. Electronically Signed   By: Shanon Brow  [place] M.D.   On: 06/13/2016 12:58   US Abdomen Limited Ruq  Result Date: 06/13/2016 CLINICAL DATA:  Subacute onset of right upper quadrant abdominal pain. Initial encounter. EXAM: US ABDOMEN LIMITED - RIGHT UPPER QUADRANT COMPARISON:  CT of the abdomen and pelvis performed 12/27/2015 FINDINGS: Gallbladder: Status post cholecystectomy.  No retained stones seen. Common bile duct: Diameter: 0.4 cm, within normal limits in caliber. Liver: No focal lesion identified. Diffusely increased parenchymal echogenicity likely reflects fatty infiltration. IMPRESSION: 1. Status post cholecystectomy. No acute abnormality seen at the right upper quadrant. 2. Diffuse fatty infiltration within the liver. Electronically Signed   By: Garald Balding M.D.   On: 06/13/2016 18:56    Procedures Procedures (including critical care time)  Medications Ordered in ED Medications  ondansetron (ZOFRAN) injection 4 mg (4 mg Intravenous Given 06/13/16 1731)  ketorolac (TORADOL) 30 MG/ML injection 30 mg (30 mg Intravenous Given 06/13/16 1731)     Initial Impression / Assessment and Plan / ED Course   I have reviewed the triage vital signs and the nursing notes.  Pertinent labs & imaging results that were available during my care of the patient were reviewed by me and considered in my medical decision making (see chart for details).  Clinical Course   Sorsha Schlein is a 30 y.o. female who presents to ED for for left sided back pain. No midline tenderness on exam. Patient demonstrates no lower extremity weakness, saddle anesthesia, bowel or bladder incontinence, or neuro deficits. No concern for cauda equina. No fevers or other infectious symptoms to suggest that the patient's back pain is due to an infection. Lower extremities are neurovascularly intact and patient is ambulatory in ED.   While performing abdominal exam, patient endorses right upper quadrant abdominal pain and is tender in this area. Negative Murphy's and nonsurgical abdomen. Ultrasound obtained which shows no acute abnormalities. Does also show diffuse fatty infiltration of the liver as well.   Evaluation does not show pathology that would require ongoing emergent intervention or inpatient treatment. Patient is hemodynamically stable and mentating appropriately. PCP follow up strongly encourage. Return precautions discussed and all questions answered.   Patient seen by and discussed with Dr. Lacinda Axon who agrees with treatment plan.   Final Clinical Impressions(s) / ED Diagnoses   Final diagnoses:  RUQ pain  Left paraspinal back pain    New Prescriptions New Prescriptions   ONDANSETRON (ZOFRAN ODT) 4 MG DISINTEGRATING TABLET    Take 1 tablet (4 mg total) by mouth every 8 (eight) hours as needed for nausea or vomiting.   TRAMADOL (ULTRAM) 50 MG TABLET    Take 1 tablet (50 mg total) by mouth every 6 (six) hours as needed.     [facility] Ward, PA-C 06/13/16 1948    Nat Christen, MD 06/18/16 1233

## 2016-06-13 NOTE — ED Triage Notes (Signed)
C/o back pain onset 2 weeks ago denies injury states pain is worse with inspiration

## 2016-06-13 NOTE — ED Notes (Addendum)
Called out patient, patient did not respond

## 2016-06-13 NOTE — ED Notes (Signed)
Pt wheeled back in a wheelchair. Assisted into gown and on bp cuff and o2.

## 2016-06-13 NOTE — Discharge Instructions (Signed)
Zofran as needed for nausea. Take pain medication only as needed for severe pain - This can make you very drowsy - please do not drink alcohol, operate heavy machinery or drive on this medication.  Follow up with your primary care provider if symptoms do not improve in the next week. You can call the clinic listed if you need a primary care doctor.  Return to ER for new or worsening symptoms, any additional concerns.

## 2016-06-13 NOTE — ED Notes (Signed)
Called out patient and no response

## 2016-07-27 ENCOUNTER — Emergency Department (HOSPITAL_COMMUNITY)
Admission: EM | Admit: 2016-07-27 | Discharge: 2016-07-27 | Disposition: A | Discharge status: Home / Self Care | Payer: Medicaid Other | Attending: Emergency Medicine | Admitting: Emergency Medicine

## 2016-07-27 ENCOUNTER — Encounter (HOSPITAL_COMMUNITY): Payer: Self-pay

## 2016-07-27 DIAGNOSIS — R1012 Left upper quadrant pain: Secondary | ICD-10-CM | POA: Insufficient documentation

## 2016-07-27 DIAGNOSIS — J45909 Unspecified asthma, uncomplicated: Secondary | ICD-10-CM | POA: Insufficient documentation

## 2016-07-27 LAB — COMPREHENSIVE METABOLIC PANEL
ALBUMIN: 4.2 g/dL (ref 3.5–5.0)
ALK PHOS: 97 U/L (ref 38–126)
ALT: 34 U/L (ref 14–54)
AST: 29 U/L (ref 15–41)
Anion gap: 7 (ref 5–15)
BUN: 17 mg/dL (ref 6–20)
CALCIUM: 9.3 mg/dL (ref 8.9–10.3)
CO2: 22 mmol/L (ref 22–32)
Chloride: 109 mmol/L (ref 101–111)
Creatinine, Ser: 0.85 mg/dL (ref 0.44–1.00)
GFR calc Af Amer: >60 mL/min (ref >60)
GFR calc non Af Amer: >60 mL/min (ref >60)
GLUCOSE: 100 mg/dL — AB (ref 65–99)
Potassium: 3.9 mmol/L (ref 3.5–5.1)
Sodium: 138 mmol/L (ref 135–145)
TOTAL PROTEIN: 7.5 g/dL (ref 6.5–8.1)
Total Bilirubin: 0.2 mg/dL — ABNORMAL LOW (ref 0.3–1.2)

## 2016-07-27 LAB — LIPASE, BLOOD: Lipase: 33 U/L (ref 11–51)

## 2016-07-27 LAB — CBC
HEMATOCRIT: 39 % (ref 36.0–46.0)
Hemoglobin: 13.1 g/dL (ref 12.0–15.0)
MCH: 29.7 pg (ref 26.0–34.0)
MCHC: 33.6 g/dL (ref 30.0–36.0)
MCV: 88.4 fL (ref 78.0–100.0)
Platelets: 305 10*3/uL (ref 150–400)
RBC: 4.41 MIL/uL (ref 3.87–5.11)
RDW: 12.6 % (ref 11.5–15.5)
WBC: 11.7 10*3/uL — ABNORMAL HIGH (ref 4.0–10.5)

## 2016-07-27 LAB — URINALYSIS, ROUTINE W REFLEX MICROSCOPIC
BILIRUBIN URINE: NEGATIVE
Glucose, UA: NEGATIVE mg/dL
KETONES UR: NEGATIVE mg/dL
NITRITE: NEGATIVE
PH: 6.5 (ref 5.0–8.0)
Protein, ur: NEGATIVE mg/dL
Specific Gravity, Urine: 1.008 (ref 1.005–1.030)

## 2016-07-27 LAB — URINE MICROSCOPIC-ADD ON

## 2016-07-27 LAB — POC URINE PREG, ED: Preg Test, Ur: NEGATIVE

## 2016-07-27 MED ORDER — PANTOPRAZOLE SODIUM 20 MG PO TBEC: 20.0000 mg | DELAYED_RELEASE_TABLET | Freq: Every day | ORAL | 0 refills | Status: DC

## 2016-07-27 MED ORDER — GI COCKTAIL ~~LOC~~
30.0000 mL | Freq: Once | ORAL | Status: AC
  Administered 2016-07-27: 30 mL via ORAL
  Filled 2016-07-27: qty 30

## 2016-07-27 MED ORDER — ONDANSETRON 4 MG PO TBDP
ORAL_TABLET | ORAL | Status: AC
  Filled 2016-07-27: qty 1

## 2016-07-27 MED ORDER — ONDANSETRON 4 MG PO TBDP
4.0000 mg | ORAL_TABLET | Freq: Once | ORAL | Status: AC | PRN
  Administered 2016-07-27: 4 mg via ORAL

## 2016-07-27 NOTE — ED Provider Notes (Signed)
[place] DEPT Provider Note   CSN: [id number] Arrival date & time: 07/27/16  2018  History   Chief Complaint Chief Complaint  Patient presents with  . Abdominal Pain  . Nausea    HPI Lorraine [name] is a 30 y.o. female.  HPI  30 y.o. female presents to the Emergency Department today complaining of upper abdominal pain x 3 days. Notes associated hematemesis occasionally that is dime sized in quantity. Notes pain with PO intake that she rates 4/10. Minimal pain at rest. Notes pain is intermittent otherwise. No fevers. Has not tried any OTC medications. No associated nausea or diarrhea. No hx same. Does not have PCP. No hx Cirrhosis. No hx of EGD. No CP/SOB. No other symptoms noted.   Past Medical History:  Diagnosis Date  . Asthma    Inhaler used 01/02/16  . Diverticulitis   . Pyelonephritis   . Pyelonephritis     Patient Active Problem List   Diagnosis Date Noted  . Pre-diabetes 07/15/2015  . Diarrhea   . Hypokalemia   . Bacterial vaginosis   . Pyelonephritis 07/07/2015  . Abdominal pain, right upper quadrant   . Asthma 10/31/2014  . History of preterm delivery 10/22/2014  . Status post repeat low transverse cesarean section 09/06/2014  . Diverticulitis 09/10/2012    Past Surgical History:  Procedure Laterality Date  . CESAREAN SECTION    . CESAREAN SECTION N/A 2006  . CESAREAN SECTION N/A 09/03/2014   Procedure: CESAREAN SECTION;  Surgeon: Guss Bunde, MD;  Location: [facility];  Service: Obstetrics;  Laterality: N/A;  . CHOLECYSTECTOMY    . TUBAL LIGATION      OB History    Gravida Para Term Preterm AB Living   5 3 1 2 2 2    SAB TAB Ectopic Multiple Live Births   2     1 2        Home Medications    Prior to Admission medications   Medication Sig Start Date End Date Taking? Authorizing Provider  albuterol (PROVENTIL HFA;VENTOLIN HFA) 108 (90 BASE) MCG/ACT inhaler Inhale 4 puffs into the lungs every 6 (six) hours as needed for wheezing or  shortness of breath. Patient not taking: Reported on 07/27/2016 08/13/14   Mariel Aloe, MD  ondansetron (ZOFRAN ODT) 4 MG disintegrating tablet Take 1 tablet (4 mg total) by mouth every 8 (eight) hours as needed for nausea or vomiting. Patient not taking: Reported on 07/27/2016 06/13/16   [facility], PA-C  predniSONE (DELTASONE) 20 MG tablet Take 2 tablets (40 mg total) by mouth daily. Patient not taking: Reported on 07/27/2016 01/16/16   Blanchie Dessert, MD  traMADol (ULTRAM) 50 MG tablet Take 1 tablet (50 mg total) by mouth every 6 (six) hours as needed. Patient not taking: Reported on 07/27/2016 06/13/16   Ozella Almond Ward, PA-C    Family History Family History  Problem Relation Age of Onset  . Hyperlipidemia Mother   . Diabetes Maternal Uncle   . Diabetes Paternal Grandmother     Social History Social History  Substance Use Topics  . Smoking status: Never Smoker  . Smokeless tobacco: Never Used  . Alcohol use No     Allergies   Patient has no known allergies.   Review of Systems Review of Systems ROS reviewed and all are negative for acute change except as noted in the HPI.  Physical Exam Updated Vital Signs BP 129/81   Pulse 89   Temp 98.2 F (36.8 C) (Oral)  Resp 18   Ht 5' (1.524 m)   Wt 84.6 kg   SpO2 100%   BMI 36.44 kg/m   Physical Exam  Constitutional: She is oriented to person, place, and time. Vital signs are normal. She appears well-developed and well-nourished.  HENT:  Head: Normocephalic.  Right Ear: Hearing normal.  Left Ear: Hearing normal.  Eyes: Conjunctivae and EOM are normal. Pupils are equal, round, and reactive to light.  Neck: Normal range of motion. Neck supple.  Cardiovascular: Normal rate, regular rhythm, normal heart sounds and intact distal pulses.   Pulmonary/Chest: Effort normal and breath sounds normal.  Abdominal: Soft. Normal appearance and bowel sounds are normal. There is no tenderness. There is no rigidity, no  rebound, no guarding, no CVA tenderness, no tenderness at McBurney's point and negative Murphy's sign.  Abdomen soft. Non tender  Musculoskeletal: Normal range of motion.  Neurological: She is alert and oriented to person, place, and time.  Skin: Skin is warm and dry.  Psychiatric: She has a normal mood and affect. Her speech is normal and behavior is normal. Thought content normal.  Nursing note and vitals reviewed.  ED Treatments / Results  Labs (all labs ordered are listed, but only abnormal results are displayed) Labs Reviewed  COMPREHENSIVE METABOLIC PANEL - Abnormal; Notable for the following:       Result Value   Glucose, Bld 100 (*)    Total Bilirubin 0.2 (*)    All other components within normal limits  CBC - Abnormal; Notable for the following:    WBC 11.7 (*)    All other components within normal limits  URINALYSIS, ROUTINE W REFLEX MICROSCOPIC (NOT AT [facility]) - Abnormal; Notable for the following:    Hgb urine dipstick LARGE (*)    Leukocytes, UA TRACE (*)    All other components within normal limits  URINE MICROSCOPIC-ADD ON - Abnormal; Notable for the following:    Squamous Epithelial / LPF 0-5 (*)    Bacteria, UA RARE (*)    All other components within normal limits  LIPASE, BLOOD  POC URINE PREG, ED   EKG  EKG Interpretation None      Radiology No results found.  Procedures Procedures (including critical care time)  Medications Ordered in ED Medications  ondansetron (ZOFRAN-ODT) disintegrating tablet 4 mg (4 mg Oral Given 07/27/16 2028)  gi cocktail (Maalox,Lidocaine,Donnatal) (30 mLs Oral Given 07/27/16 2210)   Initial Impression / Assessment and Plan / ED Course  I have reviewed the triage vital signs and the nursing notes.  Pertinent labs & imaging results that were available during my care of the patient were reviewed by me and considered in my medical decision making (see chart for details).  Clinical Course    Final Clinical Impressions(s) /  ED Diagnoses  I have reviewed and evaluated the relevant laboratory values I have reviewed the relevant previous healthcare records. I obtained HPI from historian. Patient discussed with supervising physician  ED Course:  Assessment: Patient is a 30yF presents with abdominal pain x 3 days. Epigastric. Associated hematemesis occasionally that is dime sized. Pain worse with PO intake. No hx Cirrhosis On exam, nontoxic, nonseptic appearing, in no apparent distress. Patient's pain and other symptoms adequately managed in emergency department.  Labs and vitals reviewed.  Patient does not meet the SIRS or Sepsis criteria.  On repeat exam patient does not have a surgical abdomen and there are no peritoneal signs.  No indication of appendicitis, bowel obstruction, bowel perforation, cholecystitis,  diverticulitis,PID or ectopic pregnancy. Likely Ulcer vs Reflux vs Gastritis. Would benefit from EGD from GI. Given referral. Given Trial of PPI with GI cocktail given in ED. Patient discharged home with symptomatic treatment and given strict instructions for follow-up with their primary care physician.  I have also discussed reasons to return immediately to the ER.  Patient expresses understanding and agrees with plan.  Disposition/Plan:  DC Home Additional Verbal discharge instructions given and discussed with patient.  Pt Instructed to f/u with PCP in the next week for evaluation and treatment of symptoms. Return precautions given Pt acknowledges and agrees with plan  Supervising Physician Drenda Freeze, MD   Final diagnoses:  LUQ pain    New Prescriptions New Prescriptions   No medications on file     Shary Decamp, PA-C 07/27/16 2339    Drenda Freeze, MD 07/28/16 1046

## 2016-07-27 NOTE — Discharge Instructions (Signed)
Please read and follow all provided instructions.  Your diagnoses today include:  1. LUQ pain    Tests performed today include: Blood counts and electrolytes Blood tests to check liver and kidney function Blood tests to check pancreas function Urine test to look for infection and pregnancy (in women) Vital signs. See below for your results today.   Medications prescribed:   Take any prescribed medications only as directed.  Home care instructions:  Follow any educational materials contained in this packet.  Follow-up instructions: Please follow-up with Gastroenterology for further evaluation of your symptoms.    Return instructions:  SEEK IMMEDIATE MEDICAL ATTENTION IF: The pain does not go away or becomes severe  A temperature above 101F develops  Repeated vomiting occurs (multiple episodes)  The pain becomes localized to portions of the abdomen. The right side could possibly be appendicitis. In an adult, the left lower portion of the abdomen could be colitis or diverticulitis.  Blood is being passed in stools or vomit (bright red or black tarry stools)  You develop chest pain, difficulty breathing, dizziness or fainting, or become confused, poorly responsive, or inconsolable (young children) If you have any other emergent concerns regarding your health  Additional Information: Abdominal (belly) pain can be caused by many things. Your caregiver performed an examination and possibly ordered blood/urine tests and imaging (CT scan, x-rays, ultrasound). Many cases can be observed and treated at home after initial evaluation in the emergency department. Even though you are being discharged home, abdominal pain can be unpredictable. Therefore, you need a repeated exam if your pain does not resolve, returns, or worsens. Most patients with abdominal pain don't have to be admitted to the hospital or have surgery, but serious problems like appendicitis and gallbladder attacks can start out as  nonspecific pain. Many abdominal conditions cannot be diagnosed in one visit, so follow-up evaluations are very important.  Your vital signs today were: BP 128/86    Pulse 97    Temp 98.2 F (36.8 C) (Oral)    Resp 18    Ht 5' (1.524 m)    Wt 84.6 kg    SpO2 99%    BMI 36.44 kg/m  If your blood pressure (bp) was elevated above 135/85 this visit, please have this repeated by your doctor within one month. --------------

## 2016-07-27 NOTE — ED Triage Notes (Signed)
Pt complaining of nausea and vomiting since Wednesday. Pt complaining of mid abdominal pain. Pt denies any bleeding or discharge. Pt denies any urinary symptoms.

## 2016-08-28 ENCOUNTER — Ambulatory Visit (HOSPITAL_COMMUNITY)
Admission: EM | Admit: 2016-08-28 | Discharge: 2016-08-28 | Disposition: A | Discharge status: Home / Self Care | Payer: Medicaid Other | Attending: Emergency Medicine | Admitting: Emergency Medicine

## 2016-08-28 ENCOUNTER — Encounter (HOSPITAL_COMMUNITY): Payer: Self-pay | Admitting: Emergency Medicine

## 2016-08-28 DIAGNOSIS — J029 Acute pharyngitis, unspecified: Secondary | ICD-10-CM | POA: Insufficient documentation

## 2016-08-28 DIAGNOSIS — B373 Candidiasis of vulva and vagina: Secondary | ICD-10-CM | POA: Insufficient documentation

## 2016-08-28 DIAGNOSIS — N730 Acute parametritis and pelvic cellulitis: Secondary | ICD-10-CM

## 2016-08-28 DIAGNOSIS — N739 Female pelvic inflammatory disease, unspecified: Secondary | ICD-10-CM | POA: Insufficient documentation

## 2016-08-28 DIAGNOSIS — B3731 Acute candidiasis of vulva and vagina: Secondary | ICD-10-CM

## 2016-08-28 DIAGNOSIS — B349 Viral infection, unspecified: Secondary | ICD-10-CM

## 2016-08-28 LAB — POCT URINALYSIS DIP (DEVICE)
BILIRUBIN URINE: NEGATIVE
GLUCOSE, UA: NEGATIVE mg/dL
KETONES UR: NEGATIVE mg/dL
LEUKOCYTES UA: NEGATIVE
NITRITE: NEGATIVE
PH: 6 (ref 5.0–8.0)
Protein, ur: NEGATIVE mg/dL
Specific Gravity, Urine: <=1.005 (ref 1.005–1.030)
Urobilinogen, UA: 0.2 mg/dL (ref 0.0–1.0)

## 2016-08-28 LAB — POCT H PYLORI SCREEN: H. PYLORI SCREEN, POC: NEGATIVE

## 2016-08-28 MED ORDER — FLUCONAZOLE 150 MG PO TABS: 150.0000 mg | ORAL_TABLET | Freq: Every day | ORAL | 0 refills | Status: DC

## 2016-08-28 MED ORDER — DOXYCYCLINE HYCLATE 100 MG PO CAPS: 100.0000 mg | ORAL_CAPSULE | Freq: Two times a day (BID) | ORAL | 0 refills | Status: DC

## 2016-08-28 MED ORDER — CEFTRIAXONE SODIUM 250 MG IJ SOLR
250.0000 mg | Freq: Once | INTRAMUSCULAR | Status: AC
  Administered 2016-08-28: 250 mg via INTRAMUSCULAR

## 2016-08-28 MED ORDER — CEFTRIAXONE SODIUM 250 MG IJ SOLR
INTRAMUSCULAR | Status: AC
  Filled 2016-08-28: qty 250

## 2016-08-28 NOTE — ED Triage Notes (Addendum)
[id number]

## 2016-08-28 NOTE — ED Provider Notes (Signed)
CSN: [id number]     Arrival date & time 08/28/16  1027 History   None    Chief Complaint  Patient presents with  . Sore Throat   (Consider location/radiation/quality/duration/timing/severity/associated sxs/prior Treatment) HPI Spanish interpreter used for visit [phone] Patient with PMH of heartburn and diverticulosis presents with pain in her throat that goes down to the pit of her stomach and also her lower back which began yesterday. Her back pain is constant with R >L. Reports of subjective fever which improved with Ibuprofen last night. NO rhinorrhea, nasal congestion, cough or shortness of breath. NO sick contacts. Has a history of heartburn but symptoms are not similar in that she does not have sore throat usually. Reports of pain with swallowing. Reports of some nausea but no vomiting. Reports of some HA and some dizziness. Reports of decreased appetite since yesterday. Also reports of diarrhea that is non-bloody since yesterday. She had 6 BMs yesterday and 3-2 BMs today. Reports of increased frequency of urination and dysuria for the past 2 days without hematuria. Reports of 1 week of vaginal discharge that is "sticky". No new sexual partners. Reports not concerned for STI but "you never can trust". No abnormal vaginal bleeding. No recent antibiotics.   Past Medical History:  Diagnosis Date  . Asthma    Inhaler used 01/02/16  . Diverticulitis   . Pyelonephritis   . Pyelonephritis    Past Surgical History:  Procedure Laterality Date  . CESAREAN SECTION    . CESAREAN SECTION N/A 2006  . CESAREAN SECTION N/A 09/03/2014   Procedure: CESAREAN SECTION;  Surgeon: Guss Bunde, MD;  Location: [facility];  Service: Obstetrics;  Laterality: N/A;  . CHOLECYSTECTOMY    . TUBAL LIGATION     Family History  Problem Relation Age of Onset  . Hyperlipidemia Mother   . Diabetes Maternal Uncle   . Diabetes Paternal Grandmother    Social History  Substance Use Topics  . Smoking status: Never  Smoker  . Smokeless tobacco: Never Used  . Alcohol use No   OB History    Gravida Para Term Preterm AB Living   5 3 1 2 2 2    SAB TAB Ectopic Multiple Live Births   2     1 2      Review of Systems: none  Allergies  Patient has no known allergies.  Home Medications   Prior to Admission medications   Medication Sig Start Date End Date Taking? Authorizing Provider  doxycycline (VIBRAMYCIN) 100 MG capsule Take 1 capsule (100 mg total) by mouth 2 (two) times daily. 08/28/16   Smiley Houseman, MD  fluconazole (DIFLUCAN) 150 MG tablet Take 1 tablet (150 mg total) by mouth daily. 08/28/16   Smiley Houseman, MD  pantoprazole (PROTONIX) 20 MG tablet Take 1 tablet (20 mg total) by mouth daily. 07/27/16   Shary Decamp, PA-C   Meds Ordered and Administered this Visit   Medications  cefTRIAXone (ROCEPHIN) injection 250 mg (not administered)    BP 111/77 (BP Location: Right Arm) Comment (BP Location): large cuff  Pulse 86   Temp 98.2 F (36.8 C) (Oral)   Resp 18   SpO2 98%  No data found.  Physical Exam  Constitutional: She is oriented to person, place, and time. She appears well-developed and well-nourished. No distress.  HENT:  TMs normal bilaterally. Pharynx with minimal erythema   Eyes: Conjunctivae are normal.  Neck: Normal range of motion. Neck supple. No thyromegaly present.  Marland Kitchen No  palpable cervical lymphadenopathy but reports tenderness to palpation in her left mid anterior-lateral neck. No masses palpated on neck.   Cardiovascular: Normal rate and regular rhythm.  Exam reveals no gallop.   No murmur heard. Pulmonary/Chest: Effort normal and breath sounds normal. No respiratory distress. She has no wheezes. She has no rales.  Abdominal: Soft. Bowel sounds are normal. There is tenderness.  Tenderness in the upper abdomen bilaterally without guarding or rebound tenderness. Murphy sign negative. No tenderness in the lower abdomen or suprapubic region.   Genitourinary:  Vagina normal.  Genitourinary Comments: Female genitalia: normal external genitalia, vulva, vagina, cervix. Some white thick discharge noted on speculum exam. On bimanual exam, reports of right adnexal tenderness and some cervical motion tenderness.   Musculoskeletal:  Reports of tenderness to palpation on bilateral flank region. No CVA tenderness.   Lymphadenopathy:    She has no cervical adenopathy.  Neurological: She is alert and oriented to person, place, and time.  Skin: Skin is warm and dry. Capillary refill takes less than 2 seconds. She is not diaphoretic.  Psychiatric: She has a normal mood and affect. Her behavior is normal.    Urgent Care Course   Clinical Course   Patient presents with multiple symptoms. Symptoms of abdominal pain, sore throat, generally feeling unwell possibly due to viral syndrome. Discussed symptomatic therapy. Rapid Strep is negative. Does have history of heartburn but reports symptoms are different with the sore throat. POC H. Pylori negative. She also reported of dysuria and vaginal discharge and mentioned she is unsure if she should be concerned for STI. UA without sign of infection but with trace hemoglobin (which she has had before on prior UAs). Speculum exam with discharge consistent with yeast. Will prescribe Diflucan for this. Additionally, patient did have right adnexal tenderness and cervical motion tenderness; therefore will treat empirically for PID. Give Ceftriaxone 250mg  IM x1 and will prescribe Doxycycline 100mg  PO BID x 14 days. Testing sent for Gc/Chlamydia and wet prep.  Her vitals are stable and she is afebrile. Patient is stable for discharge home.   Procedures   Labs Review Labs Reviewed  POCT URINALYSIS DIP (DEVICE) - Abnormal; Notable for the following:       Result Value   Hgb urine dipstick TRACE (*)    All other components within normal limits  POCT H PYLORI SCREEN  CERVICOVAGINAL ANCILLARY ONLY   Imaging Review No results  found.  MDM   1. Viral pharyngitis   2. Viral syndrome   3. PID (acute pelvic inflammatory disease)   4. Vaginal yeast infection    Patient presents with multiple symptoms. Symptoms of abdominal pain, sore throat, generally feeling unwell possibly due to viral syndrome. Discussed symptomatic therapy. Rapid Strep is negative. Does have history of heartburn but reports symptoms are different with the sore throat. POC H. Pylori negative. She also reported of dysuria and vaginal discharge and mentioned she is unsure if she should be concerned for STI. UA without sign of infection but with trace hemoglobin (which she has had before on prior UAs). Speculum exam with discharge consistent with yeast. Will prescribe Diflucan for this. Additionally, patient did have right adnexal tenderness and cervical motion tenderness; therefore will treat empirically for PID. Give Ceftriaxone 250mg  IM x1 and will prescribe Doxycycline 100mg  PO BID x 14 days. Testing sent for Gc/Chlamydia and wet prep.  Her vitals are stable and she is afebrile. Patient is stable for discharge home.   Presentation, exam, assessment and plan  discuss with Dr. Bridgett Larsson prior to discharge.   Smiley Houseman, MD PGY 2 Family Medicine   Smiley Houseman, MD 08/28/16 [phone]

## 2016-08-28 NOTE — Discharge Instructions (Signed)
Your sore throat, abdominal pain and diarrhea are likely due to a virus. Please continue to stay hydrated with lots of fluids since you are having diarrhea. You can try tea with honey, throat lozenges, or gargle with warm salt water for your sore throat. These symptoms may take about 1 week to fully resolve. Because you had pelvic pain on exam, we will treat your with antibiotics. You received one antibiotic injection here. You will also take Doxycyline 1 tablet twice a day for 14 days. You also seemed to have a vaginal yeast infection; please take Diflucan 1 tablet by mouth once for this. Please return to urgent care if you are unable to stay hydrated or if your symptoms worsen.

## 2016-08-28 NOTE — ED Triage Notes (Signed)
Yesterday onset of fever and sore throat and pain in stomach.  Lower back pain.  No runny nose, no cough.  Burning with urination

## 2016-08-29 ENCOUNTER — Emergency Department (HOSPITAL_COMMUNITY)
Admission: EM | Admit: 2016-08-29 | Discharge: 2016-08-29 | Disposition: A | Discharge status: Home / Self Care | Payer: Medicaid Other | Attending: Emergency Medicine | Admitting: Emergency Medicine

## 2016-08-29 ENCOUNTER — Encounter (HOSPITAL_COMMUNITY): Payer: Self-pay | Admitting: Nurse Practitioner

## 2016-08-29 DIAGNOSIS — K529 Noninfective gastroenteritis and colitis, unspecified: Secondary | ICD-10-CM | POA: Insufficient documentation

## 2016-08-29 DIAGNOSIS — J45909 Unspecified asthma, uncomplicated: Secondary | ICD-10-CM | POA: Insufficient documentation

## 2016-08-29 LAB — COMPREHENSIVE METABOLIC PANEL
ALT: 29 U/L (ref 14–54)
AST: 30 U/L (ref 15–41)
Albumin: 3.9 g/dL (ref 3.5–5.0)
Alkaline Phosphatase: 94 U/L (ref 38–126)
Anion gap: 10 (ref 5–15)
BUN: 14 mg/dL (ref 6–20)
CO2: 23 mmol/L (ref 22–32)
Calcium: 9.3 mg/dL (ref 8.9–10.3)
Chloride: 109 mmol/L (ref 101–111)
Creatinine, Ser: 0.75 mg/dL (ref 0.44–1.00)
GFR calc Af Amer: >60 mL/min (ref >60)
GFR calc non Af Amer: >60 mL/min (ref >60)
Glucose, Bld: 85 mg/dL (ref 65–99)
Potassium: 3.8 mmol/L (ref 3.5–5.1)
Sodium: 142 mmol/L (ref 135–145)
Total Bilirubin: 0.3 mg/dL (ref 0.3–1.2)
Total Protein: 6.9 g/dL (ref 6.5–8.1)

## 2016-08-29 LAB — CBC
HCT: 38.5 % (ref 36.0–46.0)
Hemoglobin: 13 g/dL (ref 12.0–15.0)
MCH: 30.3 pg (ref 26.0–34.0)
MCHC: 33.8 g/dL (ref 30.0–36.0)
MCV: 89.7 fL (ref 78.0–100.0)
Platelets: 271 10*3/uL (ref 150–400)
RBC: 4.29 MIL/uL (ref 3.87–5.11)
RDW: 13 % (ref 11.5–15.5)
WBC: 6.9 10*3/uL (ref 4.0–10.5)

## 2016-08-29 LAB — I-STAT BETA HCG BLOOD, ED (MC, WL, AP ONLY): I-stat hCG, quantitative: <5 m[IU]/mL (ref <5)

## 2016-08-29 LAB — URINALYSIS, ROUTINE W REFLEX MICROSCOPIC
Bilirubin Urine: NEGATIVE
Glucose, UA: NEGATIVE mg/dL
Hgb urine dipstick: NEGATIVE
Ketones, ur: NEGATIVE mg/dL
Leukocytes, UA: NEGATIVE
Nitrite: NEGATIVE
Protein, ur: NEGATIVE mg/dL
Specific Gravity, Urine: 1.024 (ref 1.005–1.030)
pH: 5 (ref 5.0–8.0)

## 2016-08-29 LAB — CERVICOVAGINAL ANCILLARY ONLY
Chlamydia: NEGATIVE
Neisseria Gonorrhea: NEGATIVE
Wet Prep (BD Affirm): POSITIVE — AB

## 2016-08-29 MED ORDER — ONDANSETRON 4 MG PO TBDP
4.0000 mg | ORAL_TABLET | Freq: Once | ORAL | Status: AC
Start: 2016-08-29 — End: 2016-08-29
  Administered 2016-08-29: 4 mg via ORAL
  Filled 2016-08-29: qty 1

## 2016-08-29 MED ORDER — LOPERAMIDE HCL 2 MG PO CAPS
4.0000 mg | ORAL_CAPSULE | Freq: Once | ORAL | Status: AC
Start: 2016-08-29 — End: 2016-08-29
  Administered 2016-08-29: 4 mg via ORAL
  Filled 2016-08-29: qty 2

## 2016-08-29 MED ORDER — IBUPROFEN 400 MG PO TABS
400.0000 mg | ORAL_TABLET | Freq: Once | ORAL | Status: AC
  Administered 2016-08-29: 400 mg via ORAL
  Filled 2016-08-29: qty 1

## 2016-08-29 MED ORDER — OXYCODONE-ACETAMINOPHEN 5-325 MG PO TABS
1.0000 | ORAL_TABLET | Freq: Once | ORAL | Status: AC
  Administered 2016-08-29: 1 via ORAL
  Filled 2016-08-29: qty 1

## 2016-08-29 NOTE — ED Provider Notes (Signed)
[facility] DEPT Provider Note   CSN: [id number] Arrival date & time: 08/29/16  1509  By signing my name below, I, Reola Mosher, attest that this documentation has been prepared under the direction and in the presence of Virgel Manifold, MD. Electronically Signed: Reola Mosher, ED Scribe. 08/29/16. 5:05 PM.  History   Chief Complaint Chief Complaint  Patient presents with  . GI Problem   The history is provided by the patient and medical records. No language interpreter was used.    HPI Comments: Lorraine [name] is a 30 y.o. female with a PMHx of diverticulitis, who presents to the Emergency Department complaining of persistent, burning centralized chest pain onset approximately 3 days ago. Pt notes radiation of her pain from the superior portion of her central chest into the periumbilical area of her abdomen. She reports associated mild, dry cough, generalized myalgias, fever, chills, fatigue, headache, nausea, vomiting, and diarrhea secondary to her burning chest pain. Per prior chart review, pt was seen for her symptoms yesterday at [facility]. At that time they performed rapid strep, UA, and H. Pylori screening, all of which were negative. Her symptoms were ruled as viral etiology at that time. Pt has been taking Ibuprofen at home with temporary relief of her fever; however, she notes that her fever always worsens again. Her pain is exacerbated with swallowing and with PO intake. Pt has had decreased food and fluid intake since the onset of her symptoms. No sick contacts with similar symptoms. Pt did receive her influenza vaccination this year. She denies ear pain, urgency, frequency, hematuria, dysuria, difficulty urinating, or any other associated symptoms.   Past Medical History:  Diagnosis Date  . Asthma    Inhaler used 01/02/16  . Diverticulitis   . Pyelonephritis   . Pyelonephritis    Patient Active Problem List   Diagnosis Date Noted  . Pre-diabetes 07/15/2015  .  Diarrhea   . Hypokalemia   . Bacterial vaginosis   . Pyelonephritis 07/07/2015  . Abdominal pain, right upper quadrant   . Asthma 10/31/2014  . History of preterm delivery 10/22/2014  . Status post repeat low transverse cesarean section 09/06/2014  . Diverticulitis 09/10/2012   Past Surgical History:  Procedure Laterality Date  . CESAREAN SECTION    . CESAREAN SECTION N/A 2006  . CESAREAN SECTION N/A 09/03/2014   Procedure: CESAREAN SECTION;  Surgeon: Guss Bunde, MD;  Location: [facility];  Service: Obstetrics;  Laterality: N/A;  . CHOLECYSTECTOMY    . TUBAL LIGATION     OB History    Gravida Para Term Preterm AB Living   5 3 1 2 2 2    SAB TAB Ectopic Multiple Live Births   2     1 2      Home Medications    Prior to Admission medications   Medication Sig Start Date End Date Taking? Authorizing Provider  doxycycline (VIBRAMYCIN) 100 MG capsule Take 1 capsule (100 mg total) by mouth 2 (two) times daily. 08/28/16   Smiley Houseman, MD  fluconazole (DIFLUCAN) 150 MG tablet Take 1 tablet (150 mg total) by mouth daily. 08/28/16   Smiley Houseman, MD  pantoprazole (PROTONIX) 20 MG tablet Take 1 tablet (20 mg total) by mouth daily. 07/27/16   Shary Decamp, PA-C   Family History Family History  Problem Relation Age of Onset  . Hyperlipidemia Mother   . Diabetes Maternal Uncle   . Diabetes Paternal Grandmother    Social History Social History  Substance  Use Topics  . Smoking status: Never Smoker  . Smokeless tobacco: Never Used  . Alcohol use No   Allergies   Patient has no known allergies.  Review of Systems Review of Systems  Constitutional: Positive for appetite change, chills and fever.  HENT: Negative for ear pain.   Respiratory: Positive for cough.   Cardiovascular: Positive for chest pain.  Gastrointestinal: Positive for abdominal pain, diarrhea, nausea and vomiting.  Genitourinary: Negative for difficulty urinating, dysuria, frequency, hematuria and  urgency.  Musculoskeletal: Positive for myalgias (generalized).  Neurological: Positive for headaches.  All other systems reviewed and are negative.  Physical Exam Updated Vital Signs BP 116/72 (BP Location: Right Arm)   Pulse 104   Temp 98.3 F (36.8 C) (Oral)   Resp 20   Ht 5' (1.524 m)   Wt 176 lb (79.8 kg)   SpO2 99%   BMI 34.37 kg/m   Physical Exam  Constitutional: She appears well-developed and well-nourished.  Tired appearing.   HENT:  Head: Normocephalic.  Right Ear: External ear normal.  Left Ear: External ear normal.  Nose: Nose normal.  Mouth/Throat: Uvula is midline and mucous membranes are normal. No trismus in the jaw. No uvula swelling. Posterior oropharyngeal erythema present. No oropharyngeal exudate. No tonsillar exudate.  Mild pharyngeal erythema. Uvula midline. No exudates.   Eyes: Conjunctivae are normal. Right eye exhibits no discharge. Left eye exhibits no discharge.  Neck: Normal range of motion.  Cardiovascular: Normal rate, regular rhythm and normal heart sounds.   No murmur heard. Pulmonary/Chest: Effort normal and breath sounds normal. No respiratory distress. She has no wheezes. She has no rales.  Abdominal: Soft. She exhibits no distension. There is no tenderness. There is no rebound and no guarding.  Musculoskeletal: Normal range of motion. She exhibits no edema or tenderness.  Neurological: She is alert. No cranial nerve deficit. Coordination normal.  Skin: Skin is warm and dry. No rash noted. No erythema.  Psychiatric: She has a normal mood and affect. Her behavior is normal.  Nursing note and vitals reviewed.  ED Treatments / Results  DIAGNOSTIC STUDIES: Oxygen Saturation is 99% on RA, normal by my interpretation.   COORDINATION OF CARE: 5:05 PM-Discussed next steps with pt. Pt verbalized understanding and is agreeable with the plan.   Labs (all labs ordered are listed, but only abnormal results are displayed) Labs Reviewed    COMPREHENSIVE METABOLIC PANEL  CBC  URINALYSIS, ROUTINE W REFLEX MICROSCOPIC  I-STAT BETA HCG BLOOD, ED ([facility], [facility], [facility] ONLY)   EKG  EKG Interpretation None      Radiology No results found.  Procedures Procedures   Medications Ordered in ED Medications - No data to display  Initial Impression / Assessment and Plan / ED Course  I have reviewed the triage vital signs and the nursing notes.  Pertinent labs & imaging results that were available during my care of the patient were reviewed by me and considered in my medical decision making (see chart for details).  Clinical Course     Final Clinical Impressions(s) / ED Diagnoses   Final diagnoses:  Gastroenteritis   New Prescriptions New Prescriptions   No medications on file   I personally preformed the services scribed in my presence. The recorded information has been reviewed is accurate. Virgel Manifold, MD.    Virgel Manifold, MD 09/04/16 [phone]

## 2016-08-29 NOTE — ED Triage Notes (Signed)
Pt presents with c/o GI problem. The symptoms began 3 days ago. She c/o chills, nausea, vomiting, diarrhea. She also reports cough. She has been taking ibuprofen with some symptom relief

## 2016-08-30 ENCOUNTER — Emergency Department (HOSPITAL_COMMUNITY): Payer: Medicaid Other

## 2016-08-30 ENCOUNTER — Encounter (HOSPITAL_COMMUNITY): Payer: Self-pay | Admitting: Emergency Medicine

## 2016-08-30 ENCOUNTER — Emergency Department (HOSPITAL_COMMUNITY)
Admission: EM | Admit: 2016-08-30 | Discharge: 2016-08-30 | Disposition: A | Discharge status: Home / Self Care | Payer: Medicaid Other | Attending: Emergency Medicine | Admitting: Emergency Medicine

## 2016-08-30 DIAGNOSIS — R1084 Generalized abdominal pain: Secondary | ICD-10-CM | POA: Insufficient documentation

## 2016-08-30 DIAGNOSIS — R109 Unspecified abdominal pain: Secondary | ICD-10-CM | POA: Diagnosis present

## 2016-08-30 DIAGNOSIS — J45909 Unspecified asthma, uncomplicated: Secondary | ICD-10-CM | POA: Diagnosis not present

## 2016-08-30 LAB — COMPREHENSIVE METABOLIC PANEL
ALT: 37 U/L (ref 14–54)
ANION GAP: 5 (ref 5–15)
AST: 35 U/L (ref 15–41)
Albumin: 3.9 g/dL (ref 3.5–5.0)
Alkaline Phosphatase: 92 U/L (ref 38–126)
BILIRUBIN TOTAL: 0.7 mg/dL (ref 0.3–1.2)
BUN: 17 mg/dL (ref 6–20)
CHLORIDE: 106 mmol/L (ref 101–111)
CO2: 28 mmol/L (ref 22–32)
Calcium: 8.9 mg/dL (ref 8.9–10.3)
Creatinine, Ser: 0.65 mg/dL (ref 0.44–1.00)
Glucose, Bld: 92 mg/dL (ref 65–99)
POTASSIUM: 4 mmol/L (ref 3.5–5.1)
Sodium: 139 mmol/L (ref 135–145)
TOTAL PROTEIN: 7 g/dL (ref 6.5–8.1)

## 2016-08-30 LAB — CBC
HEMATOCRIT: 37.3 % (ref 36.0–46.0)
Hemoglobin: 12.2 g/dL (ref 12.0–15.0)
MCH: 29.8 pg (ref 26.0–34.0)
MCHC: 32.7 g/dL (ref 30.0–36.0)
MCV: 91 fL (ref 78.0–100.0)
PLATELETS: 267 10*3/uL (ref 150–400)
RBC: 4.1 MIL/uL (ref 3.87–5.11)
RDW: 13.2 % (ref 11.5–15.5)
WBC: 6.6 10*3/uL (ref 4.0–10.5)

## 2016-08-30 LAB — LIPASE, BLOOD: LIPASE: 25 U/L (ref 11–51)

## 2016-08-30 LAB — URINALYSIS, ROUTINE W REFLEX MICROSCOPIC
Bilirubin Urine: NEGATIVE
GLUCOSE, UA: NEGATIVE mg/dL
Hgb urine dipstick: NEGATIVE
KETONES UR: NEGATIVE mg/dL
LEUKOCYTES UA: NEGATIVE
NITRITE: NEGATIVE
PH: 6 (ref 5.0–8.0)
PROTEIN: NEGATIVE mg/dL
Specific Gravity, Urine: 1.015 (ref 1.005–1.030)

## 2016-08-30 LAB — CULTURE, GROUP A STREP (THRC)

## 2016-08-30 LAB — I-STAT BETA HCG BLOOD, ED (MC, WL, AP ONLY): I-stat hCG, quantitative: <5 m[IU]/mL (ref <5)

## 2016-08-30 MED ORDER — SODIUM CHLORIDE 0.9 % IJ SOLN
INTRAMUSCULAR | Status: AC
  Filled 2016-08-30: qty 50

## 2016-08-30 MED ORDER — ONDANSETRON HCL 4 MG/2ML IJ SOLN
4.0000 mg | Freq: Once | INTRAMUSCULAR | Status: AC
  Administered 2016-08-30: 4 mg via INTRAVENOUS
  Filled 2016-08-30: qty 2

## 2016-08-30 MED ORDER — IOPAMIDOL (ISOVUE-300) INJECTION 61%
100.0000 mL | Freq: Once | INTRAVENOUS | Status: AC | PRN
  Administered 2016-08-30: 100 mL via INTRAVENOUS

## 2016-08-30 MED ORDER — SODIUM CHLORIDE 0.9 % IV BOLUS (SEPSIS)
1000.0000 mL | Freq: Once | INTRAVENOUS | Status: AC
  Administered 2016-08-30: 1000 mL via INTRAVENOUS

## 2016-08-30 MED ORDER — IOPAMIDOL (ISOVUE-300) INJECTION 61%
INTRAVENOUS | Status: AC
  Filled 2016-08-30: qty 100

## 2016-08-30 MED ORDER — MORPHINE SULFATE (PF) 4 MG/ML IV SOLN
4.0000 mg | Freq: Once | INTRAVENOUS | Status: AC
  Administered 2016-08-30: 4 mg via INTRAVENOUS
  Filled 2016-08-30: qty 1

## 2016-08-30 MED ORDER — DICYCLOMINE HCL 20 MG PO TABS: 20.0000 mg | ORAL_TABLET | Freq: Two times a day (BID) | ORAL | 0 refills | Status: DC

## 2016-08-30 MED ORDER — DICYCLOMINE HCL 10 MG/ML IM SOLN
20.0000 mg | Freq: Once | INTRAMUSCULAR | Status: AC
  Administered 2016-08-30: 20 mg via INTRAMUSCULAR
  Filled 2016-08-30: qty 2

## 2016-08-30 NOTE — ED Notes (Signed)
Pt. In CT. 

## 2016-08-30 NOTE — ED Notes (Signed)
ED Provider at bedside. 

## 2016-08-30 NOTE — ED Provider Notes (Signed)
[facility] [place] DEPT Provider Note   CSN: [id number] Arrival date & time: 08/30/16  1506     History   Chief Complaint Chief Complaint  Patient presents with  . Abdominal Pain    HPI Lorraine [name] is a 30 y.o. female.  HPI Patient presents with concern of abdominal pain. Pain began about 2 days ago, has been worsening. Patient is diffuse, possibly worse in the right lower quadrant. There is associated fever, nausea, vomiting, diarrhea. Last vomiting was yesterday, last diarrhea earlier today. She has a history of cholecystectomy. He states that she is otherwise generally well. Since onset, no clear alleviating or exacerbating factors.  Past Medical History:  Diagnosis Date  . Asthma    Inhaler used 01/02/16  . Diverticulitis   . Pyelonephritis   . Pyelonephritis     Patient Active Problem List   Diagnosis Date Noted  . Pre-diabetes 07/15/2015  . Diarrhea   . Hypokalemia   . Bacterial vaginosis   . Pyelonephritis 07/07/2015  . Abdominal pain, right upper quadrant   . Asthma 10/31/2014  . History of preterm delivery 10/22/2014  . Status post repeat low transverse cesarean section 09/06/2014  . Diverticulitis 09/10/2012    Past Surgical History:  Procedure Laterality Date  . CESAREAN SECTION    . CESAREAN SECTION N/A 2006  . CESAREAN SECTION N/A 09/03/2014   Procedure: CESAREAN SECTION;  Surgeon: Guss Bunde, MD;  Location: [facility];  Service: Obstetrics;  Laterality: N/A;  . CHOLECYSTECTOMY    . TUBAL LIGATION      OB History    Gravida Para Term Preterm AB Living   5 3 1 2 2 2    SAB TAB Ectopic Multiple Live Births   2     1 2        Home Medications    Prior to Admission medications   Medication Sig Start Date End Date Taking? Authorizing Provider  pantoprazole (PROTONIX) 20 MG tablet Take 1 tablet (20 mg total) by mouth daily. Patient taking differently: Take 20 mg by mouth daily as needed for heartburn.  07/27/16  Yes Shary Decamp, PA-C    doxycycline (VIBRAMYCIN) 100 MG capsule Take 1 capsule (100 mg total) by mouth 2 (two) times daily. Patient not taking: Reported on 08/30/2016 08/28/16   Smiley Houseman, MD  fluconazole (DIFLUCAN) 150 MG tablet Take 1 tablet (150 mg total) by mouth daily. Patient not taking: Reported on 08/30/2016 08/28/16   Smiley Houseman, MD    Family History Family History  Problem Relation Age of Onset  . Hyperlipidemia Mother   . Diabetes Maternal Uncle   . Diabetes Paternal Grandmother     Social History Social History  Substance Use Topics  . Smoking status: Never Smoker  . Smokeless tobacco: Never Used  . Alcohol use No     Allergies   Patient has no known allergies.   Review of Systems Review of Systems  Constitutional:       Per HPI, otherwise negative  HENT:       Per HPI, otherwise negative  Respiratory:       Per HPI, otherwise negative  Cardiovascular:       Per HPI, otherwise negative  Gastrointestinal: Positive for abdominal pain, diarrhea, nausea and vomiting.  Endocrine:       Negative aside from HPI  Genitourinary:       Neg aside from HPI   Musculoskeletal:       Per HPI, otherwise negative  Skin:  Negative.   Neurological: Negative for syncope.     Physical Exam Updated Vital Signs BP 98/61 (BP Location: Left Arm)   Pulse 72   Temp 97.9 F (36.6 C)   Resp 18   SpO2 100%   Physical Exam  Constitutional: She is oriented to person, place, and time. She appears well-developed and well-nourished. No distress.  HENT:  Head: Normocephalic and atraumatic.  Eyes: Conjunctivae and EOM are normal.  Cardiovascular: Normal rate and regular rhythm.   Pulmonary/Chest: Effort normal and breath sounds normal. No stridor. No respiratory distress.  Abdominal: She exhibits no distension. There is generalized tenderness and tenderness in the right upper quadrant, right lower quadrant, periumbilical area and suprapubic area.  Musculoskeletal: She exhibits no  edema.  Neurological: She is alert and oriented to person, place, and time. No cranial nerve deficit.  Skin: Skin is warm and dry.  Psychiatric: She has a normal mood and affect.  Nursing note and vitals reviewed.    ED Treatments / Results  Labs (all labs ordered are listed, but only abnormal results are displayed) Labs Reviewed  LIPASE, BLOOD  COMPREHENSIVE METABOLIC PANEL  CBC  URINALYSIS, ROUTINE W REFLEX MICROSCOPIC  I-STAT BETA HCG BLOOD, ED ([facility], [facility], [facility] ONLY)    Radiology No results found.  Procedures Procedures (including critical care time)  Medications Ordered in ED Medications  sodium chloride 0.9 % bolus 1,000 mL (not administered)  morphine 4 MG/ML injection 4 mg (not administered)  ondansetron (ZOFRAN) injection 4 mg (not administered)     Initial Impression / Assessment and Plan / ED Course  I have reviewed the triage vital signs and the nursing notes.  Pertinent labs & imaging results that were available during my care of the patient were reviewed by me and considered in my medical decision making (see chart for details).  Clinical Course     9:17 PM Patient awake and alert, in no distress. I discussed all findings including reassuring labs, CT with her. We discussed importance of taking medication as directed, and the patient received a shot of Bentyl. Patient will follow up with primary care. Absent peritoneal findings, distress, fever, there is low suspicion for occult infection, patient discharged in stable condition.  Final Clinical Impressions(s) / ED Diagnoses  Abdominal pain   Carmin Muskrat, MD 08/30/16 2128

## 2016-08-30 NOTE — ED Triage Notes (Signed)
Yesterday abdominal pain x several days, worsened today, couldn't walk from pain, nausea, diarrhea. No emesis. Very small amount of blood in diarrhea. No rebound tenderness.

## 2016-09-10 ENCOUNTER — Emergency Department (HOSPITAL_COMMUNITY)
Admission: EM | Admit: 2016-09-10 | Discharge: 2016-09-10 | Disposition: A | Discharge status: Home / Self Care | Payer: Medicaid Other | Attending: Physician Assistant | Admitting: Physician Assistant

## 2016-09-10 ENCOUNTER — Encounter (HOSPITAL_COMMUNITY): Payer: Self-pay

## 2016-09-10 DIAGNOSIS — R110 Nausea: Secondary | ICD-10-CM | POA: Diagnosis not present

## 2016-09-10 DIAGNOSIS — J45909 Unspecified asthma, uncomplicated: Secondary | ICD-10-CM | POA: Insufficient documentation

## 2016-09-10 DIAGNOSIS — R1011 Right upper quadrant pain: Secondary | ICD-10-CM | POA: Insufficient documentation

## 2016-09-10 LAB — CBC
HEMATOCRIT: 37.5 % (ref 36.0–46.0)
Hemoglobin: 12.6 g/dL (ref 12.0–15.0)
MCH: 30.3 pg (ref 26.0–34.0)
MCHC: 33.6 g/dL (ref 30.0–36.0)
MCV: 90.1 fL (ref 78.0–100.0)
PLATELETS: 306 10*3/uL (ref 150–400)
RBC: 4.16 MIL/uL (ref 3.87–5.11)
RDW: 12.9 % (ref 11.5–15.5)
WBC: 7.8 10*3/uL (ref 4.0–10.5)

## 2016-09-10 LAB — URINALYSIS, ROUTINE W REFLEX MICROSCOPIC
Bilirubin Urine: NEGATIVE
Glucose, UA: NEGATIVE mg/dL
Hgb urine dipstick: NEGATIVE
Ketones, ur: NEGATIVE mg/dL
LEUKOCYTES UA: NEGATIVE
Nitrite: NEGATIVE
PROTEIN: NEGATIVE mg/dL
Specific Gravity, Urine: 1.002 — ABNORMAL LOW (ref 1.005–1.030)
pH: 7 (ref 5.0–8.0)

## 2016-09-10 LAB — COMPREHENSIVE METABOLIC PANEL
ALT: 26 U/L (ref 14–54)
AST: 24 U/L (ref 15–41)
Albumin: 4 g/dL (ref 3.5–5.0)
Alkaline Phosphatase: 121 U/L (ref 38–126)
Anion gap: 4 — ABNORMAL LOW (ref 5–15)
BUN: 13 mg/dL (ref 6–20)
CHLORIDE: 106 mmol/L (ref 101–111)
CO2: 29 mmol/L (ref 22–32)
CREATININE: 1.05 mg/dL — AB (ref 0.44–1.00)
Calcium: 9.1 mg/dL (ref 8.9–10.3)
Glucose, Bld: 92 mg/dL (ref 65–99)
POTASSIUM: 3.6 mmol/L (ref 3.5–5.1)
Sodium: 139 mmol/L (ref 135–145)
Total Bilirubin: <0.1 mg/dL — ABNORMAL LOW (ref 0.3–1.2)
Total Protein: 7 g/dL (ref 6.5–8.1)

## 2016-09-10 LAB — LIPASE, BLOOD: LIPASE: 29 U/L (ref 11–51)

## 2016-09-10 LAB — POC URINE PREG, ED: Preg Test, Ur: NEGATIVE

## 2016-09-10 MED ORDER — ONDANSETRON HCL 4 MG PO TABS: 4.0000 mg | ORAL_TABLET | Freq: Three times a day (TID) | ORAL | 0 refills | Status: DC | PRN

## 2016-09-10 MED ORDER — RANITIDINE HCL 150 MG/10ML PO SYRP
150.0000 mg | ORAL_SOLUTION | Freq: Once | ORAL | Status: AC
  Administered 2016-09-10: 150 mg via ORAL
  Filled 2016-09-10: qty 10

## 2016-09-10 MED ORDER — ONDANSETRON HCL 4 MG PO TABS
4.0000 mg | ORAL_TABLET | Freq: Once | ORAL | Status: AC
  Administered 2016-09-10: 4 mg via ORAL
  Filled 2016-09-10: qty 1

## 2016-09-10 MED ORDER — GI COCKTAIL ~~LOC~~
30.0000 mL | Freq: Once | ORAL | Status: AC
  Administered 2016-09-10: 30 mL via ORAL
  Filled 2016-09-10: qty 30

## 2016-09-10 NOTE — ED Triage Notes (Signed)
Pt complaining of generalized body aches and abdominal pain. Pt complaining of nausea and urinary symptoms, denies any vomiting or diarrhea. Pt complaining of chills.

## 2016-09-10 NOTE — ED Provider Notes (Signed)
[place] DEPT Provider Note   CSN: [id number] Arrival date & time: 09/10/16  1819     History   Chief Complaint Chief Complaint  Patient presents with  . Abdominal Pain  . Generalized Body Aches    HPI Lorraine [name] is a 30 y.o. female.  HPI   Patient is a 30 year old female presenting with right upper quadrant pain. Patient reports that going on and off for 2 days. She says her partner reports sometimes it is worse the evening sometimes is worth without eating.  Patient has history of cystectomy. Patient has not had any fever, vomiting or diarrhea.    Patient denies any shortness of breath    Past Medical History:  Diagnosis Date  . Asthma    Inhaler used 01/02/16  . Diverticulitis   . Pyelonephritis   . Pyelonephritis     Patient Active Problem List   Diagnosis Date Noted  . Pre-diabetes 07/15/2015  . Diarrhea   . Hypokalemia   . Bacterial vaginosis   . Pyelonephritis 07/07/2015  . Abdominal pain, right upper quadrant   . Asthma 10/31/2014  . History of preterm delivery 10/22/2014  . Status post repeat low transverse cesarean section 09/06/2014  . Diverticulitis 09/10/2012    Past Surgical History:  Procedure Laterality Date  . CESAREAN SECTION    . CESAREAN SECTION N/A 2006  . CESAREAN SECTION N/A 09/03/2014   Procedure: CESAREAN SECTION;  Surgeon: Guss Bunde, MD;  Location: [facility];  Service: Obstetrics;  Laterality: N/A;  . CHOLECYSTECTOMY    . TUBAL LIGATION      OB History    Gravida Para Term Preterm AB Living   5 3 1 2 2 2    SAB TAB Ectopic Multiple Live Births   2     1 2        Home Medications    Prior to Admission medications   Medication Sig Start Date End Date Taking? Authorizing Provider  dicyclomine (BENTYL) 20 MG tablet Take 1 tablet (20 mg total) by mouth 2 (two) times daily. 08/30/16   Carmin Muskrat, MD  doxycycline (VIBRAMYCIN) 100 MG capsule Take 1 capsule (100 mg total) by mouth 2 (two) times  daily. Patient not taking: Reported on 08/30/2016 08/28/16   Smiley Houseman, MD  fluconazole (DIFLUCAN) 150 MG tablet Take 1 tablet (150 mg total) by mouth daily. Patient not taking: Reported on 08/30/2016 08/28/16   Smiley Houseman, MD  ondansetron (ZOFRAN) 4 MG tablet Take 1 tablet (4 mg total) by mouth every 8 (eight) hours as needed for nausea or vomiting. 09/10/16   Riel Hirschman Lyn Jestina Stephani, MD  pantoprazole (PROTONIX) 20 MG tablet Take 1 tablet (20 mg total) by mouth daily. Patient taking differently: Take 20 mg by mouth daily as needed for heartburn.  07/27/16   Shary Decamp, PA-C    Family History Family History  Problem Relation Age of Onset  . Hyperlipidemia Mother   . Diabetes Maternal Uncle   . Diabetes Paternal Grandmother     Social History Social History  Substance Use Topics  . Smoking status: Never Smoker  . Smokeless tobacco: Never Used  . Alcohol use No     Allergies   Patient has no known allergies.   Review of Systems Review of Systems  Constitutional: Negative for fatigue and fever.  Gastrointestinal: Positive for abdominal pain and nausea. Negative for vomiting.  All other systems reviewed and are negative.    Physical Exam Updated Vital Signs BP 108/69  Pulse 80   Temp 98.6 F (37 C) (Oral)   Resp 16   LMP 09/03/2016   SpO2 99%   Physical Exam  Constitutional: She is oriented to person, place, and time. She appears well-developed and well-nourished.  HENT:  Head: Normocephalic and atraumatic.  Eyes: Right eye exhibits no discharge.  Cardiovascular: Normal rate, regular rhythm and normal heart sounds.   No murmur heard. Pulmonary/Chest: Effort normal and breath sounds normal. She has no wheezes. She has no rales.  Abdominal: Soft. She exhibits no distension. There is tenderness.  Mild epigastric versus right upper quadrant tenderness.  Neurological: She is oriented to person, place, and time.  Skin: Skin is warm and dry. She is  not diaphoretic.  Psychiatric: She has a normal mood and affect.  Nursing note and vitals reviewed.    ED Treatments / Results  Labs (all labs ordered are listed, but only abnormal results are displayed) Labs Reviewed  COMPREHENSIVE METABOLIC PANEL - Abnormal; Notable for the following:       Result Value   Creatinine, Ser 1.05 (*)    Total Bilirubin <0.1 (*)    Anion gap 4 (*)    All other components within normal limits  URINALYSIS, ROUTINE W REFLEX MICROSCOPIC - Abnormal; Notable for the following:    Color, Urine STRAW (*)    Specific Gravity, Urine 1.002 (*)    All other components within normal limits  LIPASE, BLOOD  CBC  POC URINE PREG, ED    EKG  EKG Interpretation None       Radiology No results found.  Procedures Procedures (including critical care time)  Medications Ordered in ED Medications  gi cocktail (Maalox,Lidocaine,Donnatal) (30 mLs Oral Given 09/10/16 2056)  ranitidine (ZANTAC) 150 MG/10ML syrup 150 mg (150 mg Oral Given 09/10/16 2057)  ondansetron (ZOFRAN) tablet 4 mg (4 mg Oral Given 09/10/16 2104)     Initial Impression / Assessment and Plan / ED Course  I have reviewed the triage vital signs and the nursing notes.  Pertinent labs & imaging results that were available during my care of the patient were reviewed by me and considered in my medical decision making (see chart for details).  Clinical Course     She is well-appearing female presenting with mild epigastric/right upper quadrant tenderness and epigastric symptoms. It occasionally meaning patient not evening. Suspect peptic ulcer disease versus gastritis. Patient's labs show no evidence of retained stone or infection. Vital signs are normal. We will treat for GERD, and by mouth challenge.   Normal vitals, PE and labs. PO challenged passed.   Suspect viral GI, will have her follow with GI.    Final Clinical Impressions(s) / ED Diagnoses   Final diagnoses:  Nausea    New  Prescriptions Discharge Medication List as of 09/10/2016 10:46 PM    START taking these medications   Details  ondansetron (ZOFRAN) 4 MG tablet Take 1 tablet (4 mg total) by mouth every 8 (eight) hours as needed for nausea or vomiting., Starting Mon 09/10/2016, Print         Lala Been Julio Alm, MD 09/11/16 1505

## 2016-09-10 NOTE — Discharge Instructions (Signed)
You were seen today for nausea and epigastric pain. You did better with Zofran. We've given a prescription for zofran. Please return if you have any increase in pain, fever, other concerns.

## 2016-10-30 ENCOUNTER — Encounter: Payer: Self-pay | Admitting: Pediatric Intensive Care

## 2016-11-06 ENCOUNTER — Ambulatory Visit: Payer: Medicaid Other

## 2016-11-19 ENCOUNTER — Encounter (HOSPITAL_COMMUNITY): Payer: Self-pay | Admitting: Emergency Medicine

## 2016-11-19 ENCOUNTER — Ambulatory Visit (HOSPITAL_COMMUNITY)
Admission: EM | Admit: 2016-11-19 | Discharge: 2016-11-19 | Disposition: A | Discharge status: Home / Self Care | Payer: Medicaid Other | Attending: Internal Medicine | Admitting: Internal Medicine

## 2016-11-19 DIAGNOSIS — R42 Dizziness and giddiness: Secondary | ICD-10-CM

## 2016-11-19 MED ORDER — MECLIZINE HCL 12.5 MG PO TABS: 12.5000 mg | ORAL_TABLET | Freq: Three times a day (TID) | ORAL | 0 refills | Status: DC | PRN

## 2016-11-19 MED ORDER — FLUTICASONE PROPIONATE 50 MCG/ACT NA SUSP: 2.0000 | Freq: Every day | NASAL | 2 refills | Status: DC

## 2016-11-19 NOTE — ED Provider Notes (Signed)
CSN: [id number]     Arrival date & time 11/19/16  1220 History   None    Chief Complaint  Patient presents with  . Headache   (Consider location/radiation/quality/duration/timing/severity/associated sxs/prior Treatment) 31 year old female presents to clinic presents with a 3 day history of dizziness. She states it worse when she lies down, it is worse with movement, and also worse when riding in her car. She denies recent history of URI, ringing or pressure in her ears, congestion, or other symptoms. She has no chest pain, no palpitations, no shortness of breath, denies headache, or other symptoms.   The history is provided by the patient. The history is limited by a language barrier. A language interpreter was used.    Past Medical History:  Diagnosis Date  . Asthma    Inhaler used 01/02/16  . Diverticulitis   . Pyelonephritis   . Pyelonephritis    Past Surgical History:  Procedure Laterality Date  . CESAREAN SECTION    . CESAREAN SECTION N/A 2006  . CESAREAN SECTION N/A 09/03/2014   Procedure: CESAREAN SECTION;  Surgeon: Guss Bunde, MD;  Location: [facility];  Service: Obstetrics;  Laterality: N/A;  . CHOLECYSTECTOMY    . TUBAL LIGATION     Family History  Problem Relation Age of Onset  . Hyperlipidemia Mother   . Diabetes Maternal Uncle   . Diabetes Paternal Grandmother    Social History  Substance Use Topics  . Smoking status: Never Smoker  . Smokeless tobacco: Never Used  . Alcohol use No   OB History    Gravida Para Term Preterm AB Living   5 3 1 2 2 2    SAB TAB Ectopic Multiple Live Births   2     1 2      Review of Systems  Reason unable to perform ROS: As covered in history of present illness.  All other systems reviewed and are negative.   Allergies  Patient has no known allergies.  Home Medications   Prior to Admission medications   Medication Sig Start Date End Date Taking? Authorizing Provider  ALBUTEROL IN Inhale into the lungs.   Yes  Historical Provider, MD  dicyclomine (BENTYL) 20 MG tablet Take 1 tablet (20 mg total) by mouth 2 (two) times daily. Patient not taking: Reported on 11/19/2016 08/30/16   Carmin Muskrat, MD  doxycycline (VIBRAMYCIN) 100 MG capsule Take 1 capsule (100 mg total) by mouth 2 (two) times daily. Patient not taking: Reported on 08/30/2016 08/28/16   Smiley Houseman, MD  fluconazole (DIFLUCAN) 150 MG tablet Take 1 tablet (150 mg total) by mouth daily. Patient not taking: Reported on 08/30/2016 08/28/16   Smiley Houseman, MD  fluticasone [facility]) 50 MCG/ACT nasal spray Place 2 sprays into both nostrils daily. 11/19/16   Barnet Glasgow, NP  meclizine (ANTIVERT) 12.5 MG tablet Take 1 tablet (12.5 mg total) by mouth 3 (three) times daily as needed for dizziness. 11/19/16   Barnet Glasgow, NP  ondansetron (ZOFRAN) 4 MG tablet Take 1 tablet (4 mg total) by mouth every 8 (eight) hours as needed for nausea or vomiting. 09/10/16   Courteney Lyn Mackuen, MD  pantoprazole (PROTONIX) 20 MG tablet Take 1 tablet (20 mg total) by mouth daily. Patient taking differently: Take 20 mg by mouth daily as needed for heartburn.  07/27/16   Shary Decamp, PA-C   Meds Ordered and Administered this Visit  Medications - No data to display  BP 127/76 (BP Location: Left Arm) Comment (BP  Location): large cuff  Pulse 87   Temp 98.2 F (36.8 C) (Oral)   Resp 20   LMP 10/22/2016   SpO2 100%  No data found.   Physical Exam  Constitutional: She is oriented to person, place, and time. She appears well-developed and well-nourished. No distress.  HENT:  Head: Normocephalic and atraumatic.  Right Ear: Tympanic membrane and external ear normal.  Left Ear: Tympanic membrane and external ear normal.  Nose: Nose normal.  Mouth/Throat: Oropharynx is clear and moist. No oropharyngeal exudate.  Eyes: EOM are normal. Pupils are equal, round, and reactive to light.  Neck: Normal range of motion. Neck supple. No JVD present.   Cardiovascular: Normal rate and regular rhythm.   Pulmonary/Chest: Effort normal and breath sounds normal.  Lymphadenopathy:    She has no cervical adenopathy.  Neurological: She is alert and oriented to person, place, and time. No cranial nerve deficit.  Skin: Skin is warm and dry. Capillary refill takes less than 2 seconds. She is not diaphoretic.  Psychiatric: She has a normal mood and affect.  Nursing note and vitals reviewed.   Urgent Care Course     Procedures (including critical care time)  Labs Review Labs Reviewed - No data to display  Imaging Review No results found.    MDM   1. Vertigo    I'm treating you today for vertigo. I have prescribed a drug called meclizine, take one tablet 3 times a day as needed for dizziness. Also prescribed Flonase nasal spray, due to sprays each nostril once a day as well. Should your symptoms persist, follow-up with primary care provider, or return to clinic as needed.      Barnet Glasgow, NP 11/19/16 1321

## 2016-11-19 NOTE — ED Triage Notes (Signed)
See s/s 

## 2016-11-19 NOTE — Discharge Instructions (Signed)
I'm treating you today for vertigo. I have prescribed a drug called meclizine, take one tablet 3 times a day as needed for dizziness. Also prescribed Flonase nasal spray, due to sprays each nostril once a day as well. Should your symptoms persist, follow-up with primary care provider, or return to clinic as needed.

## 2016-11-20 ENCOUNTER — Emergency Department (HOSPITAL_COMMUNITY)
Admission: EM | Admit: 2016-11-20 | Discharge: 2016-11-21 | Disposition: A | Discharge status: Home / Self Care | Payer: Medicaid Other | Attending: Emergency Medicine | Admitting: Emergency Medicine

## 2016-11-20 ENCOUNTER — Encounter (HOSPITAL_COMMUNITY): Payer: Self-pay | Admitting: Emergency Medicine

## 2016-11-20 DIAGNOSIS — J45909 Unspecified asthma, uncomplicated: Secondary | ICD-10-CM | POA: Diagnosis not present

## 2016-11-20 DIAGNOSIS — R42 Dizziness and giddiness: Secondary | ICD-10-CM | POA: Insufficient documentation

## 2016-11-20 LAB — BASIC METABOLIC PANEL
ANION GAP: 10 (ref 5–15)
BUN: 19 mg/dL (ref 6–20)
CALCIUM: 9.4 mg/dL (ref 8.9–10.3)
CO2: 23 mmol/L (ref 22–32)
Chloride: 101 mmol/L (ref 101–111)
Creatinine, Ser: 0.9 mg/dL (ref 0.44–1.00)
Glucose, Bld: 85 mg/dL (ref 65–99)
POTASSIUM: 3.9 mmol/L (ref 3.5–5.1)
SODIUM: 134 mmol/L — AB (ref 135–145)

## 2016-11-20 LAB — URINALYSIS, ROUTINE W REFLEX MICROSCOPIC
Bilirubin Urine: NEGATIVE
GLUCOSE, UA: NEGATIVE mg/dL
Hgb urine dipstick: NEGATIVE
KETONES UR: NEGATIVE mg/dL
Leukocytes, UA: NEGATIVE
NITRITE: NEGATIVE
PROTEIN: NEGATIVE mg/dL
Specific Gravity, Urine: 1.021 (ref 1.005–1.030)
pH: 7 (ref 5.0–8.0)

## 2016-11-20 LAB — CBC WITH DIFFERENTIAL/PLATELET
BASOS ABS: 0 10*3/uL (ref 0.0–0.1)
BASOS PCT: 0 %
EOS ABS: 0.3 10*3/uL (ref 0.0–0.7)
EOS PCT: 3 %
HCT: 37.5 % (ref 36.0–46.0)
Hemoglobin: 12.7 g/dL (ref 12.0–15.0)
LYMPHS PCT: 35 %
Lymphs Abs: 3.8 10*3/uL (ref 0.7–4.0)
MCH: 30.8 pg (ref 26.0–34.0)
MCHC: 33.9 g/dL (ref 30.0–36.0)
MCV: 91 fL (ref 78.0–100.0)
Monocytes Absolute: 0.5 10*3/uL (ref 0.1–1.0)
Monocytes Relative: 5 %
Neutro Abs: 6.2 10*3/uL (ref 1.7–7.7)
Neutrophils Relative %: 57 %
PLATELETS: 257 10*3/uL (ref 150–400)
RBC: 4.12 MIL/uL (ref 3.87–5.11)
RDW: 12.7 % (ref 11.5–15.5)
WBC: 10.9 10*3/uL — AB (ref 4.0–10.5)

## 2016-11-20 LAB — PREGNANCY, URINE: PREG TEST UR: NEGATIVE

## 2016-11-20 MED ORDER — ONDANSETRON 4 MG PO TBDP
8.0000 mg | ORAL_TABLET | Freq: Once | ORAL | Status: AC
  Administered 2016-11-21: 8 mg via ORAL
  Filled 2016-11-20: qty 2

## 2016-11-20 MED ORDER — LORAZEPAM 1 MG PO TABS: 1.0000 mg | ORAL_TABLET | Freq: Once | ORAL | Status: DC

## 2016-11-20 MED ORDER — MECLIZINE HCL 25 MG PO TABS: 25.0000 mg | ORAL_TABLET | Freq: Once | ORAL | Status: DC

## 2016-11-20 MED ORDER — MECLIZINE HCL 25 MG PO TABS
25.0000 mg | ORAL_TABLET | Freq: Once | ORAL | Status: AC
  Administered 2016-11-20: 25 mg via ORAL
  Filled 2016-11-20: qty 1

## 2016-11-20 NOTE — ED Notes (Signed)
Pt complains of dizziness, fatigue and headache since Friday. Pt states she has had 4 episodes of vomiting. Pt took tylenol and Flonex, a [place] medication for pain you buy OTC, with no relief.

## 2016-11-20 NOTE — ED Provider Notes (Signed)
[facility] DEPT Provider Note   CSN: [id number] Arrival date & time: 11/20/16  1944     History   Chief Complaint Chief Complaint  Patient presents with  . Dizziness  . Emesis    HPI Lorraine [name] is a 31 y.o. female.  Patient presents emergency department with chief complaint of dizziness. She reports associated nausea and vomiting. She states that the symptoms started on Friday. She reports that her symptoms are worsened with head movement. There are improved when she hold her head still and closes her eyes. She denies any associated fevers, or chills. Denies any neck stiffness or neck pain. She denies any numbness, weakness, or tingling. He states that she has an associated ringing in her ears. She denies any recent illnesses. There are no other associated symptoms or modifying factors.   The history is provided by the patient. The history is limited by a language barrier. A language interpreter was used.    Past Medical History:  Diagnosis Date  . Asthma    Inhaler used 01/02/16  . Diverticulitis   . Pyelonephritis   . Pyelonephritis     Patient Active Problem List   Diagnosis Date Noted  . Pre-diabetes 07/15/2015  . Diarrhea   . Hypokalemia   . Bacterial vaginosis   . Pyelonephritis 07/07/2015  . Abdominal pain, right upper quadrant   . Asthma 10/31/2014  . History of preterm delivery 10/22/2014  . Status post repeat low transverse cesarean section 09/06/2014  . Diverticulitis 09/10/2012    Past Surgical History:  Procedure Laterality Date  . CESAREAN SECTION    . CESAREAN SECTION N/A 2006  . CESAREAN SECTION N/A 09/03/2014   Procedure: CESAREAN SECTION;  Surgeon: Guss Bunde, MD;  Location: [facility];  Service: Obstetrics;  Laterality: N/A;  . CHOLECYSTECTOMY    . TUBAL LIGATION      OB History    Gravida Para Term Preterm AB Living   5 3 1 2 2 2    SAB TAB Ectopic Multiple Live Births   2     1 2        Home Medications    Prior to  Admission medications   Medication Sig Start Date End Date Taking? Authorizing Provider  ALBUTEROL IN Inhale into the lungs.    Historical Provider, MD  dicyclomine (BENTYL) 20 MG tablet Take 1 tablet (20 mg total) by mouth 2 (two) times daily. Patient not taking: Reported on 11/19/2016 08/30/16   Carmin Muskrat, MD  doxycycline (VIBRAMYCIN) 100 MG capsule Take 1 capsule (100 mg total) by mouth 2 (two) times daily. Patient not taking: Reported on 08/30/2016 08/28/16   Smiley Houseman, MD  fluconazole (DIFLUCAN) 150 MG tablet Take 1 tablet (150 mg total) by mouth daily. Patient not taking: Reported on 08/30/2016 08/28/16   Smiley Houseman, MD  fluticasone [facility]) 50 MCG/ACT nasal spray Place 2 sprays into both nostrils daily. 11/19/16   Barnet Glasgow, NP  meclizine (ANTIVERT) 12.5 MG tablet Take 1 tablet (12.5 mg total) by mouth 3 (three) times daily as needed for dizziness. 11/19/16   Barnet Glasgow, NP  ondansetron (ZOFRAN) 4 MG tablet Take 1 tablet (4 mg total) by mouth every 8 (eight) hours as needed for nausea or vomiting. 09/10/16   Courteney Lyn Mackuen, MD  pantoprazole (PROTONIX) 20 MG tablet Take 1 tablet (20 mg total) by mouth daily. Patient taking differently: Take 20 mg by mouth daily as needed for heartburn.  07/27/16   Shary Decamp, PA-C  Family History Family History  Problem Relation Age of Onset  . Hyperlipidemia Mother   . Diabetes Maternal Uncle   . Diabetes Paternal Grandmother     Social History Social History  Substance Use Topics  . Smoking status: Never Smoker  . Smokeless tobacco: Never Used  . Alcohol use No     Allergies   Patient has no known allergies.   Review of Systems Review of Systems  All other systems reviewed and are negative.    Physical Exam Updated Vital Signs BP 142/97 (BP Location: Left Arm)   Pulse 94   Temp 98.7 F (37.1 C) (Oral)   Resp 16   Ht 5' (1.524 m)   Wt 79.8 kg   LMP 10/22/2016   SpO2 100%   BMI  34.37 kg/m   Physical Exam  Constitutional: She is oriented to person, place, and time. She appears well-developed and well-nourished.  HENT:  Head: Normocephalic and atraumatic.  Eyes: Conjunctivae and EOM are normal. Pupils are equal, round, and reactive to light.  Horizontal nystagmus   Neck: Normal range of motion. Neck supple.  No neck stiffness  Cardiovascular: Normal rate and regular rhythm.  Exam reveals no gallop and no friction rub.   No murmur heard. Pulmonary/Chest: Effort normal and breath sounds normal. No respiratory distress. She has no wheezes. She has no rales. She exhibits no tenderness.  Abdominal: Soft. Bowel sounds are normal. She exhibits no distension and no mass. There is no tenderness. There is no rebound and no guarding.  No focal abdominal tenderness, no RLQ tenderness or pain at McBurney's point, no RUQ tenderness or Murphy's sign, no left-sided abdominal tenderness, no fluid wave, or signs of peritonitis   Musculoskeletal: Normal range of motion. She exhibits no edema or tenderness.  Neurological: She is alert and oriented to person, place, and time.  Skin: Skin is warm and dry.  Psychiatric: She has a normal mood and affect. Her behavior is normal. Judgment and thought content normal.  Nursing note and vitals reviewed.    ED Treatments / Results  Labs (all labs ordered are listed, but only abnormal results are displayed) Labs Reviewed  CBC WITH DIFFERENTIAL/PLATELET - Abnormal; Notable for the following:       Result Value   WBC 10.9 (*)    All other components within normal limits  BASIC METABOLIC PANEL - Abnormal; Notable for the following:    Sodium 134 (*)    All other components within normal limits  URINALYSIS, ROUTINE W REFLEX MICROSCOPIC  PREGNANCY, URINE  POC URINE PREG, ED    EKG  EKG Interpretation None       Radiology No results found.  Procedures Procedures (including critical care time)  Medications Ordered in  ED Medications  meclizine (ANTIVERT) tablet 25 mg (not administered)     Initial Impression / Assessment and Plan / ED Course  I have reviewed the triage vital signs and the nursing notes.  Pertinent labs & imaging results that were available during my care of the patient were reviewed by me and considered in my medical decision making (see chart for details).     Patient symptoms seem consistent with vertigo. She reports worsening symptoms that she moves her head and eyes. He has horizontal nystagmus on exam. She is afebrile. She has no neck stiffness. No abdominal tenderness. Will treat with meclizine. Her laboratory workup is reassuring. Upon chart review, she was seen at an urgent care yesterday and diagnosed with the same.  Will reassess after meclizine.  Patient feels improved after meclizine. She states that she feels slightly nauseated still. I'll discharge her home with meclizine and Zofran.  Final Clinical Impressions(s) / ED Diagnoses   Final diagnoses:  Vertigo    New Prescriptions New Prescriptions   MECLIZINE (ANTIVERT) 25 MG TABLET    Take 1 tablet (25 mg total) by mouth 2 (two) times daily.   ONDANSETRON (ZOFRAN) 4 MG TABLET    Take 1 tablet (4 mg total) by mouth every 8 (eight) hours as needed for nausea or vomiting.     Montine Circle, PA-C 11/21/16 0009    Isla Pence, MD 11/21/16 2130

## 2016-11-20 NOTE — ED Triage Notes (Signed)
Pt states she is been feeling dizzy with nausea, vomiting and diarrhea since last Friday, states she had fever and chills last night.

## 2016-11-21 MED ORDER — ONDANSETRON HCL 4 MG PO TABS: 4.0000 mg | ORAL_TABLET | Freq: Three times a day (TID) | ORAL | 0 refills | Status: DC | PRN

## 2016-11-21 MED ORDER — MECLIZINE HCL 25 MG PO TABS: 25.0000 mg | ORAL_TABLET | Freq: Two times a day (BID) | ORAL | 0 refills | Status: DC

## 2016-11-23 NOTE — Congregational Nurse Program (Signed)
Congregational Nurse Program Note  Date of Encounter: 10/30/2016  Past Medical History: Past Medical History:  Diagnosis Date  . Asthma    Inhaler used 01/02/16  . Diverticulitis   . Pyelonephritis   . Pyelonephritis     Encounter Details:     CNP Questionnaire - 10/30/16 1500      Patient Demographics   Is this a new or existing patient? New   Patient is considered a/an Immigrant   Race Latino/Hispanic     Patient Assistance   Location of Patient Assistance [facility]   Patient's financial/insurance status [facility]   Uninsured Patient ([facility]/[employer]) Yes   Interventions Counseled to make appt. with provider   Patient referred to apply for the following financial assistance [employer] insecurities addressed Not Applicable   Transportation assistance No   Assistance securing medications No   Doctor, hospital the healthcare system     Encounter Details   Primary purpose of visit Education/Health Concerns;Navigating the Healthcare System   Was an Emergency Department visit averted? Not Applicable   Does patient have a medical provider? Yes   Patient referred to Follow up with established PCP   Was a mental health screening completed? (GAINS tool) No   Does patient have dental issues? No   Does patient have vision issues? No   Does your patient have an abnormal blood pressure today? No   Since previous encounter, have you referred patient for abnormal blood pressure that resulted in a new diagnosis or medication change? No   Does your patient have an abnormal blood glucose today? No   Since previous encounter, have you referred patient for abnormal blood glucose that resulted in a new diagnosis or medication change? No   Was there a life-saving intervention made? No     Via interpreter Meredith Pel- client states that she has had chronic left shoulder pain and needs BP check. She has been taking  ibuprofen for pain and states it doesn't give relief. CN counseled client to make appointment with PCP for evaluation.

## 2017-02-03 ENCOUNTER — Encounter (HOSPITAL_COMMUNITY): Payer: Self-pay | Admitting: *Deleted

## 2017-02-03 ENCOUNTER — Ambulatory Visit (HOSPITAL_COMMUNITY)
Admission: EM | Admit: 2017-02-03 | Discharge: 2017-02-03 | Disposition: A | Discharge status: Home / Self Care | Payer: Medicaid Other | Attending: Internal Medicine | Admitting: Internal Medicine

## 2017-02-03 DIAGNOSIS — M7542 Impingement syndrome of left shoulder: Secondary | ICD-10-CM | POA: Diagnosis not present

## 2017-02-03 MED ORDER — PREDNISONE 10 MG (21) PO TBPK: ORAL_TABLET | Freq: Every day | ORAL | 0 refills | Status: DC

## 2017-02-03 NOTE — ED Provider Notes (Signed)
CSN: [id number]     Arrival date & time 02/03/17  1713 History   First MD Initiated Contact with Patient 02/03/17 1941     Chief Complaint  Patient presents with  . Arm Pain   (Consider location/radiation/quality/duration/timing/severity/associated sxs/prior Treatment) 31 year old female presents to clinic with a chief complaint of left shoulder pain ongoing for 3 weeks gradually worsening. States that now the pain is to the point where she has difficulty moving her shoulder, she has difficulty lifting heavy objects. She denies any trauma, and she denies any repetitive motions, does not have occupational injuries.    The history is provided by the patient.  Arm Pain     Past Medical History:  Diagnosis Date  . Asthma    Inhaler used 01/02/16  . Diverticulitis   . Pyelonephritis   . Pyelonephritis    Past Surgical History:  Procedure Laterality Date  . CESAREAN SECTION    . CESAREAN SECTION N/A 2006  . CESAREAN SECTION N/A 09/03/2014   Procedure: CESAREAN SECTION;  Surgeon: Guss Bunde, MD;  Location: [facility];  Service: Obstetrics;  Laterality: N/A;  . CHOLECYSTECTOMY    . TUBAL LIGATION     Family History  Problem Relation Age of Onset  . Hyperlipidemia Mother   . Diabetes Maternal Uncle   . Diabetes Paternal Grandmother    Social History  Substance Use Topics  . Smoking status: Never Smoker  . Smokeless tobacco: Never Used  . Alcohol use No   OB History    Gravida Para Term Preterm AB Living   5 3 1 2 2 2    SAB TAB Ectopic Multiple Live Births   2     1 2      Review of Systems  Constitutional: Negative.   HENT: Negative.   Respiratory: Negative.   Cardiovascular: Negative.   Musculoskeletal:       Shoulder pain  Skin: Negative.   Neurological: Negative.     Allergies  Patient has no known allergies.  Home Medications   Prior to Admission medications   Medication Sig Start Date End Date Taking? Authorizing Provider  predniSONE (STERAPRED UNI-PAK  21 TAB) 10 MG (21) TBPK tablet Take by mouth daily. Take 6 tabs by mouth daily  for 2 days, then 5 tabs for 2 days, then 4 tabs for 2 days, then 3 tabs for 2 days, 2 tabs for 2 days, then 1 tab by mouth daily for 2 days 02/03/17   Barnet Glasgow, NP   Meds Ordered and Administered this Visit  Medications - No data to display  BP 127/78   Pulse 82   Temp 98.3 F (36.8 C) (Oral)   Resp 16   LMP 01/20/2017 (Approximate)   SpO2 100%  No data found.   Physical Exam  Constitutional: She is oriented to person, place, and time. She appears well-developed and well-nourished.  HENT:  Head: Normocephalic.  Right Ear: External ear normal.  Left Ear: External ear normal.  Eyes: Conjunctivae are normal.  Neck: Normal range of motion.  Cardiovascular: Normal rate and regular rhythm.   Pulmonary/Chest: Effort normal and breath sounds normal.  Musculoskeletal:       Left shoulder: She exhibits decreased range of motion, tenderness (supraspinatus ) and pain.  Neurological: She is alert and oriented to person, place, and time.  Skin: Skin is warm. Capillary refill takes less than 2 seconds.  Psychiatric: She has a normal mood and affect. Her behavior is normal.  Nursing note and  vitals reviewed.   Urgent Care Course     Procedures (including critical care time)  Labs Review Labs Reviewed - No data to display  Imaging Review No results found.    MDM   1. Rotator cuff impingement syndrome of left shoulder    Shoulder and arm placed in a sling for support, started on long course of prednisone, recommend following up with orthopedics.    Barnet Glasgow, NP 02/03/17 2235

## 2017-02-03 NOTE — ED Triage Notes (Signed)
Denies injury.  C/O pain originating in left hand and radiating up through entire arm, down into torso and left lower leg.  Ambulating without difficulty.

## 2017-02-03 NOTE — Discharge Instructions (Signed)
You have a rotator cuff injury. We have placed her shoulder in a sling, and I have started you on a 12 day course of prednisone. Take as directed. Provided a referral to an orthopedist, if he continued to have pain follow-up with this doctor.

## 2017-02-05 ENCOUNTER — Encounter (HOSPITAL_COMMUNITY): Payer: Self-pay

## 2017-02-05 ENCOUNTER — Emergency Department (HOSPITAL_COMMUNITY): Payer: Medicaid Other

## 2017-02-05 DIAGNOSIS — M25512 Pain in left shoulder: Secondary | ICD-10-CM | POA: Diagnosis not present

## 2017-02-05 DIAGNOSIS — J45909 Unspecified asthma, uncomplicated: Secondary | ICD-10-CM | POA: Diagnosis not present

## 2017-02-05 DIAGNOSIS — Y999 Unspecified external cause status: Secondary | ICD-10-CM | POA: Insufficient documentation

## 2017-02-05 DIAGNOSIS — X500XXA Overexertion from strenuous movement or load, initial encounter: Secondary | ICD-10-CM | POA: Insufficient documentation

## 2017-02-05 DIAGNOSIS — Y9389 Activity, other specified: Secondary | ICD-10-CM | POA: Insufficient documentation

## 2017-02-05 DIAGNOSIS — R51 Headache: Secondary | ICD-10-CM | POA: Insufficient documentation

## 2017-02-05 DIAGNOSIS — R791 Abnormal coagulation profile: Secondary | ICD-10-CM | POA: Diagnosis not present

## 2017-02-05 DIAGNOSIS — Y929 Unspecified place or not applicable: Secondary | ICD-10-CM | POA: Diagnosis not present

## 2017-02-05 DIAGNOSIS — S4992XA Unspecified injury of left shoulder and upper arm, initial encounter: Secondary | ICD-10-CM | POA: Diagnosis present

## 2017-02-05 LAB — I-STAT TROPONIN, ED: Troponin i, poc: 0 ng/mL (ref 0.00–0.08)

## 2017-02-05 LAB — I-STAT CHEM 8, ED
BUN: 19 mg/dL (ref 6–20)
CALCIUM ION: 1.13 mmol/L — AB (ref 1.15–1.40)
CHLORIDE: 105 mmol/L (ref 101–111)
CREATININE: 1.1 mg/dL — AB (ref 0.44–1.00)
GLUCOSE: 97 mg/dL (ref 65–99)
HCT: 37 % (ref 36.0–46.0)
Hemoglobin: 12.6 g/dL (ref 12.0–15.0)
POTASSIUM: 4 mmol/L (ref 3.5–5.1)
Sodium: 141 mmol/L (ref 135–145)
TCO2: 26 mmol/L (ref 0–100)

## 2017-02-05 LAB — PROTIME-INR
INR: 0.97
PROTHROMBIN TIME: 12.8 s (ref 11.4–15.2)

## 2017-02-05 LAB — CBC
HCT: 37.3 % (ref 36.0–46.0)
Hemoglobin: 12.2 g/dL (ref 12.0–15.0)
MCH: 29.9 pg (ref 26.0–34.0)
MCHC: 32.7 g/dL (ref 30.0–36.0)
MCV: 91.4 fL (ref 78.0–100.0)
PLATELETS: 289 10*3/uL (ref 150–400)
RBC: 4.08 MIL/uL (ref 3.87–5.11)
RDW: 12.9 % (ref 11.5–15.5)
WBC: 10.4 10*3/uL (ref 4.0–10.5)

## 2017-02-05 LAB — COMPREHENSIVE METABOLIC PANEL
ALBUMIN: 4.1 g/dL (ref 3.5–5.0)
ALT: 26 U/L (ref 14–54)
AST: 24 U/L (ref 15–41)
Alkaline Phosphatase: 93 U/L (ref 38–126)
Anion gap: 7 (ref 5–15)
BUN: 17 mg/dL (ref 6–20)
CALCIUM: 9.3 mg/dL (ref 8.9–10.3)
CHLORIDE: 107 mmol/L (ref 101–111)
CO2: 25 mmol/L (ref 22–32)
CREATININE: 1.08 mg/dL — AB (ref 0.44–1.00)
GFR calc Af Amer: >60 mL/min (ref >60)
GFR calc non Af Amer: >60 mL/min (ref >60)
GLUCOSE: 101 mg/dL — AB (ref 65–99)
Potassium: 3.9 mmol/L (ref 3.5–5.1)
SODIUM: 139 mmol/L (ref 135–145)
Total Bilirubin: 0.3 mg/dL (ref 0.3–1.2)
Total Protein: 7.2 g/dL (ref 6.5–8.1)

## 2017-02-05 LAB — DIFFERENTIAL
BASOS PCT: 0 %
Basophils Absolute: 0 10*3/uL (ref 0.0–0.1)
Eosinophils Absolute: 0.2 10*3/uL (ref 0.0–0.7)
Eosinophils Relative: 2 %
LYMPHS PCT: 35 %
Lymphs Abs: 3.6 10*3/uL (ref 0.7–4.0)
MONO ABS: 0.4 10*3/uL (ref 0.1–1.0)
Monocytes Relative: 4 %
NEUTROS ABS: 6.1 10*3/uL (ref 1.7–7.7)
Neutrophils Relative %: 59 %

## 2017-02-05 LAB — APTT: APTT: 30 s (ref 24–36)

## 2017-02-05 NOTE — ED Triage Notes (Signed)
Triage done using [place] interpreter. Pt reports stabbing pain to left shoulder that radiates to her left arm down to her left leg onset two days ago. Pt ambulatory with steady gait. Two days ago she had a headache, the pain and dizziness. Pt speaking in clear complete sentences, NAD. She did go to urgent care two days ago on the 20th but "all the did was check my arm and that was it. They told me I had tendonitis."

## 2017-02-06 ENCOUNTER — Emergency Department (HOSPITAL_COMMUNITY)
Admission: EM | Admit: 2017-02-06 | Discharge: 2017-02-06 | Disposition: A | Discharge status: Home / Self Care | Payer: Medicaid Other | Attending: Emergency Medicine | Admitting: Emergency Medicine

## 2017-02-06 DIAGNOSIS — M79609 Pain in unspecified limb: Secondary | ICD-10-CM

## 2017-02-06 MED ORDER — CYCLOBENZAPRINE HCL 10 MG PO TABS: 10.0000 mg | ORAL_TABLET | Freq: Three times a day (TID) | ORAL | 0 refills | Status: DC | PRN

## 2017-02-06 MED ORDER — TRAMADOL HCL 50 MG PO TABS: 50.0000 mg | ORAL_TABLET | Freq: Four times a day (QID) | ORAL | 0 refills | Status: DC | PRN

## 2017-02-06 MED ORDER — ONDANSETRON 4 MG PO TBDP
8.0000 mg | ORAL_TABLET | Freq: Once | ORAL | Status: AC
  Administered 2017-02-06: 8 mg via ORAL
  Filled 2017-02-06: qty 2

## 2017-02-06 MED ORDER — NAPROXEN 500 MG PO TABS: 500.0000 mg | ORAL_TABLET | Freq: Two times a day (BID) | ORAL | 0 refills | Status: DC

## 2017-02-06 MED ORDER — KETOROLAC TROMETHAMINE 60 MG/2ML IM SOLN
60.0000 mg | Freq: Once | INTRAMUSCULAR | Status: AC
  Administered 2017-02-06: 60 mg via INTRAMUSCULAR
  Filled 2017-02-06: qty 2

## 2017-02-06 MED ORDER — HYDROMORPHONE HCL 1 MG/ML IJ SOLN
1.0000 mg | Freq: Once | INTRAMUSCULAR | Status: AC
  Administered 2017-02-06: 1 mg via INTRAMUSCULAR
  Filled 2017-02-06: qty 1

## 2017-02-06 NOTE — ED Provider Notes (Signed)
[facility] DEPT Provider Note   CSN: [id number] Arrival date & time: 02/05/17  2035  By signing my name below, I, Lise Auer, attest that this documentation has been prepared under the direction and in the presence of Gaige Sebo, Gwenyth Allegra, *. Electronically Signed: Lise Auer, ED Scribe. 02/06/17. 3:35 AM.  History   Chief Complaint No chief complaint on file.  The history is provided by the patient. A language interpreter was used.    HPI Comments: Lorraine [name] is a 31 y.o. female with a PMHx of pyelonephritis, diverticulitis, and asthma, who presents to the Emergency Department complaining of persistent left sided pain that radiates from the left shoulder to the left lower extremity that worsened today. Pt reports associated swelling to her left foot. She notes that she it "just hurts". Pain is worsened with palpation as well as heavy lifting. She was seen by urgent care on 05/20  For the same sx's and was diagnosed with tendoniitis. She was prescribed Prednisone and has been complaint with no improvement. Denies any recent injuries. No additional treatment tried PTA. Denies neck pain.   Past Medical History:  Diagnosis Date  . Asthma    Inhaler used 01/02/16  . Diverticulitis   . Pyelonephritis   . Pyelonephritis     Patient Active Problem List   Diagnosis Date Noted  . Pre-diabetes 07/15/2015  . Diarrhea   . Hypokalemia   . Bacterial vaginosis   . Pyelonephritis 07/07/2015  . Abdominal pain, right upper quadrant   . Asthma 10/31/2014  . History of preterm delivery 10/22/2014  . Status post repeat low transverse cesarean section 09/06/2014  . Diverticulitis 09/10/2012    Past Surgical History:  Procedure Laterality Date  . CESAREAN SECTION    . CESAREAN SECTION N/A 2006  . CESAREAN SECTION N/A 09/03/2014   Procedure: CESAREAN SECTION;  Surgeon: Guss Bunde, MD;  Location: [facility];  Service: Obstetrics;  Laterality: N/A;  . CHOLECYSTECTOMY    . TUBAL  LIGATION      OB History    Gravida Para Term Preterm AB Living   5 3 1 2 2 2    SAB TAB Ectopic Multiple Live Births   2     1 2        Home Medications    Prior to Admission medications   Medication Sig Start Date End Date Taking? Authorizing Provider  cyclobenzaprine (FLEXERIL) 10 MG tablet Take 1 tablet (10 mg total) by mouth 3 (three) times daily as needed for muscle spasms. 02/06/17   Orpah Greek, MD  naproxen (NAPROSYN) 500 MG tablet Take 1 tablet (500 mg total) by mouth 2 (two) times daily. 02/06/17   Orpah Greek, MD  predniSONE (STERAPRED UNI-PAK 21 TAB) 10 MG (21) TBPK tablet Take by mouth daily. Take 6 tabs by mouth daily  for 2 days, then 5 tabs for 2 days, then 4 tabs for 2 days, then 3 tabs for 2 days, 2 tabs for 2 days, then 1 tab by mouth daily for 2 days 02/03/17   Barnet Glasgow, NP  traMADol (ULTRAM) 50 MG tablet Take 1 tablet (50 mg total) by mouth every 6 (six) hours as needed. 02/06/17   Orpah Greek, MD    Family History Family History  Problem Relation Age of Onset  . Hyperlipidemia Mother   . Diabetes Maternal Uncle   . Diabetes Paternal Grandmother     Social History Social History  Substance Use Topics  . Smoking status: Never  Smoker  . Smokeless tobacco: Never Used  . Alcohol use No     Allergies   Patient has no known allergies.   Review of Systems Review of Systems  Cardiovascular: Positive for leg swelling (Swelling to the left foot. ).  Musculoskeletal: Positive for arthralgias and myalgias. Negative for neck pain.  All other systems reviewed and are negative.   Physical Exam Updated Vital Signs BP 121/80 (BP Location: Left Arm)   Pulse 85   Temp 98.6 F (37 C) (Oral)   Resp 16   LMP 01/20/2017 (Approximate)   SpO2 100%   Physical Exam  Constitutional: She is oriented to person, place, and time. She appears well-developed and well-nourished. No distress.  HENT:  Head: Normocephalic and  atraumatic.  Right Ear: Hearing normal.  Left Ear: Hearing normal.  Nose: Nose normal.  Mouth/Throat: Oropharynx is clear and moist and mucous membranes are normal.  Eyes: Conjunctivae and EOM are normal. Pupils are equal, round, and reactive to light.  Neck: Normal range of motion. Neck supple.  Cardiovascular: Regular rhythm, S1 normal and S2 normal.  Exam reveals no gallop and no friction rub.   No murmur heard. Pulmonary/Chest: Effort normal and breath sounds normal. No respiratory distress. She exhibits no tenderness.  Abdominal: Soft. Normal appearance and bowel sounds are normal. There is no hepatosplenomegaly. There is no tenderness. There is no rebound, no guarding, no tenderness at McBurney's point and negative Murphy's sign. No hernia.  Musculoskeletal: Normal range of motion. She exhibits tenderness.  Diffuse left should tenderness. Painful ROM.   Neurological: She is alert and oriented to person, place, and time. She has normal strength. No cranial nerve deficit or sensory deficit. Coordination normal. GCS eye subscore is 4. GCS verbal subscore is 5. GCS motor subscore is 6.  Skin: Skin is warm, dry and intact. No rash noted. No cyanosis.  Psychiatric: She has a normal mood and affect. Her speech is normal and behavior is normal. Thought content normal.  Nursing note and vitals reviewed.  ED Treatments / Results  DIAGNOSTIC STUDIES: Oxygen Saturation is 100% on RA, normal by my interpretation.   COORDINATION OF CARE: 3:21 AM-Discussed next steps with pt. Pt verbalized understanding and is agreeable with the plan.   Labs (all labs ordered are listed, but only abnormal results are displayed) Labs Reviewed  COMPREHENSIVE METABOLIC PANEL - Abnormal; Notable for the following:       Result Value   Glucose, Bld 101 (*)    Creatinine, Ser 1.08 (*)    All other components within normal limits  I-STAT CHEM 8, ED - Abnormal; Notable for the following:    Creatinine, Ser 1.10  (*)    Calcium, Ion 1.13 (*)    All other components within normal limits  PROTIME-INR  APTT  CBC  DIFFERENTIAL  I-STAT TROPOININ, ED  CBG MONITORING, ED    EKG  EKG Interpretation  Date/Time:  Tuesday Feb 05 2017 21:06:46 EDT Ventricular Rate:  86 PR Interval:  140 QRS Duration: 88 QT Interval:  364 QTC Calculation: 435 R Axis:   67 Text Interpretation:  Normal sinus rhythm Normal ECG No significant change since last tracing Confirmed by WARD,  DO, KRISTEN [phone]) on 02/06/2017 2:28:22 AM       Radiology Ct Head Wo Contrast  Result Date: 02/06/2017 CLINICAL DATA:  Acute onset of left temporal stabbing headache. Initial encounter. EXAM: CT HEAD WITHOUT CONTRAST TECHNIQUE: Contiguous axial images were obtained from the base of the skull  through the vertex without intravenous contrast. COMPARISON:  CT of the head performed 09/22/2015 FINDINGS: Brain: No evidence of acute infarction, hemorrhage, hydrocephalus, extra-axial collection or mass lesion/mass effect. The posterior fossa, including the cerebellum, brainstem and fourth ventricle, is within normal limits. The third and lateral ventricles, and basal ganglia are unremarkable in appearance. The cerebral hemispheres are symmetric in appearance, with normal gray-white differentiation. No mass effect or midline shift is seen. Vascular: No hyperdense vessel or unexpected calcification. Skull: There is no evidence of fracture; visualized osseous structures are unremarkable in appearance. Sinuses/Orbits: The orbits are within normal limits. The paranasal sinuses and mastoid air cells are well-aerated. Other: No significant soft tissue abnormalities are seen. IMPRESSION: Unremarkable noncontrast CT of the head. Electronically Signed   By: Garald Balding M.D.   On: 02/06/2017 00:04    Procedures Procedures (including critical care time)  Medications Ordered in ED Medications  ketorolac (TORADOL) injection 60 mg (not administered)    HYDROmorphone (DILAUDID) injection 1 mg (not administered)  ondansetron (ZOFRAN-ODT) disintegrating tablet 8 mg (not administered)     Initial Impression / Assessment and Plan / ED Course  I have reviewed the triage vital signs and the nursing notes.  Pertinent labs & imaging results that were available during my care of the patient were reviewed by me and considered in my medical decision making (see chart for details).     Patient presents to the emergency room for evaluation of left side pain. Patient is experiencing primarily pain in the area of the left shoulder and arm but states that the pain radiates all the way down her left side to the foot. She denies any injury. She was seen at urgent care 2 days ago, told she had tendinitis. She started the prednisone but has not had any improvement.  Was initiated by nursing staff at triage. The workup is entirely negative. Her examination does not support diagnosis of stroke. Complaint is primarily pain, not weakness, numbness or sensory change.  Examination reveals severe tenderness and painful range of motion of the left shoulder, no neurologic deficits.  Examination is consistent with inflammatory component. Tendinitis certainly is possible. It's unclear the connection between the arm and leg pain. She does not have any low back pain, lower extremity neurologic deficit. No saddle anesthesia or foot drop. Patient reassured, treat with rest, analgesia and anti-inflammatory.  Final Clinical Impressions(s) / ED Diagnoses   Final diagnoses:  Musculoskeletal pain of extremity    New Prescriptions New Prescriptions   CYCLOBENZAPRINE (FLEXERIL) 10 MG TABLET    Take 1 tablet (10 mg total) by mouth 3 (three) times daily as needed for muscle spasms.   NAPROXEN (NAPROSYN) 500 MG TABLET    Take 1 tablet (500 mg total) by mouth 2 (two) times daily.   TRAMADOL (ULTRAM) 50 MG TABLET    Take 1 tablet (50 mg total) by mouth every 6 (six) hours as  needed.   I personally performed the services described in this documentation, which was scribed in my presence. The recorded information has been reviewed and is accurate.     Orpah Greek, MD 02/06/17 [phone]

## 2017-04-01 ENCOUNTER — Encounter (HOSPITAL_COMMUNITY): Payer: Self-pay | Admitting: Emergency Medicine

## 2017-04-01 DIAGNOSIS — K859 Acute pancreatitis without necrosis or infection, unspecified: Principal | ICD-10-CM | POA: Diagnosis present

## 2017-04-01 DIAGNOSIS — K5730 Diverticulosis of large intestine without perforation or abscess without bleeding: Secondary | ICD-10-CM | POA: Diagnosis present

## 2017-04-01 DIAGNOSIS — N83201 Unspecified ovarian cyst, right side: Secondary | ICD-10-CM | POA: Diagnosis present

## 2017-04-01 DIAGNOSIS — K2970 Gastritis, unspecified, without bleeding: Secondary | ICD-10-CM | POA: Diagnosis present

## 2017-04-01 DIAGNOSIS — K760 Fatty (change of) liver, not elsewhere classified: Secondary | ICD-10-CM | POA: Diagnosis present

## 2017-04-01 DIAGNOSIS — Z6835 Body mass index (BMI) 35.0-35.9, adult: Secondary | ICD-10-CM

## 2017-04-01 DIAGNOSIS — K219 Gastro-esophageal reflux disease without esophagitis: Secondary | ICD-10-CM | POA: Diagnosis present

## 2017-04-01 DIAGNOSIS — Z79899 Other long term (current) drug therapy: Secondary | ICD-10-CM

## 2017-04-01 DIAGNOSIS — R066 Hiccough: Secondary | ICD-10-CM | POA: Diagnosis not present

## 2017-04-01 DIAGNOSIS — N920 Excessive and frequent menstruation with regular cycle: Secondary | ICD-10-CM | POA: Diagnosis present

## 2017-04-01 DIAGNOSIS — J45909 Unspecified asthma, uncomplicated: Secondary | ICD-10-CM | POA: Diagnosis present

## 2017-04-01 LAB — URINALYSIS, ROUTINE W REFLEX MICROSCOPIC
BILIRUBIN URINE: NEGATIVE
GLUCOSE, UA: NEGATIVE mg/dL
Hgb urine dipstick: NEGATIVE
KETONES UR: NEGATIVE mg/dL
Leukocytes, UA: NEGATIVE
Nitrite: NEGATIVE
PH: 5 (ref 5.0–8.0)
Protein, ur: NEGATIVE mg/dL
Specific Gravity, Urine: 1.02 (ref 1.005–1.030)

## 2017-04-01 LAB — COMPREHENSIVE METABOLIC PANEL
ALBUMIN: 4 g/dL (ref 3.5–5.0)
ALT: 38 U/L (ref 14–54)
AST: 37 U/L (ref 15–41)
Alkaline Phosphatase: 80 U/L (ref 38–126)
Anion gap: 8 (ref 5–15)
BILIRUBIN TOTAL: 0.4 mg/dL (ref 0.3–1.2)
BUN: 10 mg/dL (ref 6–20)
CHLORIDE: 104 mmol/L (ref 101–111)
CO2: 25 mmol/L (ref 22–32)
Calcium: 8.9 mg/dL (ref 8.9–10.3)
Creatinine, Ser: 0.94 mg/dL (ref 0.44–1.00)
GFR calc Af Amer: >60 mL/min (ref >60)
GFR calc non Af Amer: >60 mL/min (ref >60)
GLUCOSE: 102 mg/dL — AB (ref 65–99)
POTASSIUM: 3.8 mmol/L (ref 3.5–5.1)
SODIUM: 137 mmol/L (ref 135–145)
Total Protein: 6.9 g/dL (ref 6.5–8.1)

## 2017-04-01 LAB — CBC
HEMATOCRIT: 37.9 % (ref 36.0–46.0)
Hemoglobin: 12.4 g/dL (ref 12.0–15.0)
MCH: 29.6 pg (ref 26.0–34.0)
MCHC: 32.7 g/dL (ref 30.0–36.0)
MCV: 90.5 fL (ref 78.0–100.0)
Platelets: 273 10*3/uL (ref 150–400)
RBC: 4.19 MIL/uL (ref 3.87–5.11)
RDW: 12.4 % (ref 11.5–15.5)
WBC: 9.3 10*3/uL (ref 4.0–10.5)

## 2017-04-01 LAB — HCG, QUANTITATIVE, PREGNANCY: hCG, Beta Chain, Quant, S: <1 m[IU]/mL (ref <5)

## 2017-04-01 LAB — LIPASE, BLOOD: LIPASE: 31 U/L (ref 11–51)

## 2017-04-01 MED ORDER — ONDANSETRON 4 MG PO TBDP
4.0000 mg | ORAL_TABLET | Freq: Once | ORAL | Status: AC | PRN
  Administered 2017-04-01: 4 mg via ORAL

## 2017-04-01 MED ORDER — ONDANSETRON 4 MG PO TBDP
ORAL_TABLET | ORAL | Status: AC
  Filled 2017-04-01: qty 1

## 2017-04-01 NOTE — ED Triage Notes (Signed)
Pt reports nausea for a week, emesis for three days and abdominal pain that started yesterday.  Denies SOB, loose stool, fever

## 2017-04-02 ENCOUNTER — Emergency Department (HOSPITAL_COMMUNITY): Payer: Medicaid Other

## 2017-04-02 ENCOUNTER — Encounter (HOSPITAL_COMMUNITY): Payer: Self-pay | Admitting: General Practice

## 2017-04-02 ENCOUNTER — Inpatient Hospital Stay (HOSPITAL_COMMUNITY)
Admission: EM | Admit: 2017-04-02 | Discharge: 2017-04-09 | DRG: 440 | Disposition: A | Discharge status: Home / Self Care | Payer: Medicaid Other | Attending: Family Medicine | Admitting: Family Medicine

## 2017-04-02 DIAGNOSIS — R1033 Periumbilical pain: Secondary | ICD-10-CM

## 2017-04-02 DIAGNOSIS — J45902 Unspecified asthma with status asthmaticus: Secondary | ICD-10-CM

## 2017-04-02 DIAGNOSIS — R109 Unspecified abdominal pain: Secondary | ICD-10-CM

## 2017-04-02 DIAGNOSIS — R112 Nausea with vomiting, unspecified: Secondary | ICD-10-CM

## 2017-04-02 DIAGNOSIS — R52 Pain, unspecified: Secondary | ICD-10-CM

## 2017-04-02 DIAGNOSIS — K859 Acute pancreatitis without necrosis or infection, unspecified: Secondary | ICD-10-CM | POA: Diagnosis not present

## 2017-04-02 DIAGNOSIS — R1012 Left upper quadrant pain: Secondary | ICD-10-CM | POA: Diagnosis not present

## 2017-04-02 DIAGNOSIS — K760 Fatty (change of) liver, not elsewhere classified: Secondary | ICD-10-CM | POA: Diagnosis present

## 2017-04-02 DIAGNOSIS — K7689 Other specified diseases of liver: Secondary | ICD-10-CM

## 2017-04-02 DIAGNOSIS — R1110 Vomiting, unspecified: Secondary | ICD-10-CM

## 2017-04-02 DIAGNOSIS — K219 Gastro-esophageal reflux disease without esophagitis: Secondary | ICD-10-CM

## 2017-04-02 DIAGNOSIS — E669 Obesity, unspecified: Secondary | ICD-10-CM | POA: Diagnosis present

## 2017-04-02 DIAGNOSIS — J45909 Unspecified asthma, uncomplicated: Secondary | ICD-10-CM | POA: Diagnosis present

## 2017-04-02 HISTORY — DX: Nausea with vomiting, unspecified: R11.2

## 2017-04-02 HISTORY — DX: Prediabetes: R73.03

## 2017-04-02 LAB — LIPID PANEL
Cholesterol: 174 mg/dL (ref 0–200)
HDL: 46 mg/dL (ref >40)
LDL Cholesterol: 99 mg/dL (ref 0–99)
Total CHOL/HDL Ratio: 3.8 RATIO
Triglycerides: 146 mg/dL (ref <150)
VLDL: 29 mg/dL (ref 0–40)

## 2017-04-02 LAB — RAPID URINE DRUG SCREEN, HOSP PERFORMED
AMPHETAMINES: NOT DETECTED
BENZODIAZEPINES: NOT DETECTED
Barbiturates: NOT DETECTED
COCAINE: NOT DETECTED
Opiates: NOT DETECTED
Tetrahydrocannabinol: NOT DETECTED

## 2017-04-02 LAB — ETHANOL

## 2017-04-02 LAB — PHOSPHORUS: PHOSPHORUS: 2.1 mg/dL — AB (ref 2.5–4.6)

## 2017-04-02 LAB — MAGNESIUM: MAGNESIUM: 1.8 mg/dL (ref 1.7–2.4)

## 2017-04-02 MED ORDER — SODIUM CHLORIDE 0.9 % IV BOLUS (SEPSIS)
1000.0000 mL | Freq: Once | INTRAVENOUS | Status: AC
  Administered 2017-04-02: 1000 mL via INTRAVENOUS

## 2017-04-02 MED ORDER — ALBUTEROL SULFATE (2.5 MG/3ML) 0.083% IN NEBU: 2.5000 mg | INHALATION_SOLUTION | Freq: Four times a day (QID) | RESPIRATORY_TRACT | Status: DC | PRN

## 2017-04-02 MED ORDER — ACETAMINOPHEN 325 MG PO TABS: 650.0000 mg | ORAL_TABLET | Freq: Four times a day (QID) | ORAL | Status: DC | PRN

## 2017-04-02 MED ORDER — PROMETHAZINE HCL 25 MG/ML IJ SOLN
25.0000 mg | Freq: Four times a day (QID) | INTRAMUSCULAR | Status: DC | PRN
  Administered 2017-04-03 – 2017-04-04 (×2): 25 mg via INTRAVENOUS
  Administered 2017-04-05: 12.5 mg via INTRAVENOUS
  Administered 2017-04-05: 25 mg via INTRAVENOUS
  Administered 2017-04-05: 12.5 mg via INTRAVENOUS
  Administered 2017-04-06 – 2017-04-09 (×6): 25 mg via INTRAVENOUS
  Filled 2017-04-02 (×11): qty 1

## 2017-04-02 MED ORDER — ACETAMINOPHEN 650 MG RE SUPP: 650.0000 mg | Freq: Four times a day (QID) | RECTAL | Status: DC | PRN

## 2017-04-02 MED ORDER — METOCLOPRAMIDE HCL 5 MG/ML IJ SOLN
10.0000 mg | Freq: Once | INTRAMUSCULAR | Status: DC
  Filled 2017-04-02: qty 2

## 2017-04-02 MED ORDER — ONDANSETRON HCL 4 MG/2ML IJ SOLN
4.0000 mg | Freq: Once | INTRAMUSCULAR | Status: AC
  Administered 2017-04-02: 4 mg via INTRAVENOUS
  Filled 2017-04-02: qty 2

## 2017-04-02 MED ORDER — POTASSIUM PHOSPHATES 15 MMOLE/5ML IV SOLN
30.0000 mmol | Freq: Once | INTRAVENOUS | Status: AC
  Administered 2017-04-02: 30 mmol via INTRAVENOUS
  Filled 2017-04-02: qty 10

## 2017-04-02 MED ORDER — MORPHINE SULFATE (PF) 4 MG/ML IV SOLN
1.0000 mg | INTRAVENOUS | Status: DC | PRN
  Administered 2017-04-02: 2 mg via INTRAVENOUS
  Administered 2017-04-02: 3 mg via INTRAVENOUS
  Administered 2017-04-03 – 2017-04-08 (×28): 4 mg via INTRAVENOUS
  Filled 2017-04-02 (×30): qty 1

## 2017-04-02 MED ORDER — ENOXAPARIN SODIUM 40 MG/0.4ML ~~LOC~~ SOLN
40.0000 mg | Freq: Every day | SUBCUTANEOUS | Status: DC
  Administered 2017-04-02 – 2017-04-09 (×8): 40 mg via SUBCUTANEOUS
  Filled 2017-04-02 (×8): qty 0.4

## 2017-04-02 MED ORDER — PROMETHAZINE HCL 25 MG/ML IJ SOLN
12.5000 mg | Freq: Once | INTRAMUSCULAR | Status: AC
  Administered 2017-04-02: 12.5 mg via INTRAVENOUS
  Filled 2017-04-02: qty 1

## 2017-04-02 MED ORDER — DIPHENHYDRAMINE HCL 50 MG/ML IJ SOLN: 25.0000 mg | Freq: Once | INTRAMUSCULAR | Status: DC

## 2017-04-02 MED ORDER — MORPHINE SULFATE (PF) 4 MG/ML IV SOLN
4.0000 mg | Freq: Once | INTRAVENOUS | Status: AC
  Administered 2017-04-02: 4 mg via INTRAVENOUS
  Filled 2017-04-02: qty 1

## 2017-04-02 MED ORDER — IOPAMIDOL (ISOVUE-300) INJECTION 61%
INTRAVENOUS | Status: AC
  Filled 2017-04-02: qty 100

## 2017-04-02 MED ORDER — PANTOPRAZOLE SODIUM 40 MG IV SOLR
40.0000 mg | Freq: Two times a day (BID) | INTRAVENOUS | Status: DC
  Administered 2017-04-02 – 2017-04-04 (×5): 40 mg via INTRAVENOUS
  Filled 2017-04-02 (×5): qty 40

## 2017-04-02 MED ORDER — ALBUTEROL SULFATE HFA 108 (90 BASE) MCG/ACT IN AERS: 1.0000 | INHALATION_SPRAY | Freq: Four times a day (QID) | RESPIRATORY_TRACT | Status: DC | PRN

## 2017-04-02 MED ORDER — IOPAMIDOL (ISOVUE-300) INJECTION 61%
INTRAVENOUS | Status: AC
  Administered 2017-04-02: 100 mL
  Filled 2017-04-02: qty 100

## 2017-04-02 MED ORDER — ONDANSETRON HCL 4 MG/2ML IJ SOLN: 4.0000 mg | Freq: Four times a day (QID) | INTRAMUSCULAR | Status: DC | PRN

## 2017-04-02 MED ORDER — KETOROLAC TROMETHAMINE 15 MG/ML IJ SOLN
15.0000 mg | Freq: Four times a day (QID) | INTRAMUSCULAR | Status: DC
  Administered 2017-04-02 – 2017-04-04 (×10): 15 mg via INTRAVENOUS
  Filled 2017-04-02 (×9): qty 1

## 2017-04-02 MED ORDER — ONDANSETRON HCL 4 MG/2ML IJ SOLN
4.0000 mg | Freq: Four times a day (QID) | INTRAMUSCULAR | Status: DC | PRN
  Administered 2017-04-03 – 2017-04-08 (×12): 4 mg via INTRAVENOUS
  Filled 2017-04-02 (×12): qty 2

## 2017-04-02 MED ORDER — MORPHINE SULFATE (PF) 4 MG/ML IV SOLN
4.0000 mg | Freq: Once | INTRAVENOUS | Status: AC
Start: 2017-04-02 — End: 2017-04-02
  Administered 2017-04-02: 4 mg via INTRAVENOUS
  Filled 2017-04-02: qty 1

## 2017-04-02 MED ORDER — SODIUM CHLORIDE 0.9 % IV SOLN
INTRAVENOUS | Status: DC
  Administered 2017-04-02 – 2017-04-05 (×7): via INTRAVENOUS

## 2017-04-02 NOTE — ED Notes (Signed)
Pt. given ginger ale but can not tolerate due to nausea and recurring LUQ pain .

## 2017-04-02 NOTE — Progress Notes (Addendum)
Phosphorus low at 2.1 so will give K phos 30 mmol x 1 and repeat phosphorus level in am  Lipids are normal including triglycerides  Erin Hearing, ANP

## 2017-04-02 NOTE — ED Notes (Signed)
Patient transported to CT scan . 

## 2017-04-02 NOTE — Care Management Note (Signed)
Case Management Note  Patient Details  Name: Lorraine [name] MRN: [id number] Date of Birth: 02/12/1986  Subjective/Objective:  Pt admitted on 04/02/17 with intractable N/V and abdominal pain, possible pancreatitis.  PTA, pt independent of ADLS.                    Action/Plan: Received consult for medication assistance.  Met with pt, but she was not feeling well, and speaks little [place].  Will return when pt feeling better with interpreter device.    Expected Discharge Date:                  Expected Discharge Plan:  Home/Self Care  In-House Referral:     Discharge planning Services  CM Consult  Post Acute Care Choice:    Choice offered to:     DME Arranged:    DME Agency:     HH Arranged:    HH Agency:     Status of Service:  In process, will continue to follow  If discussed at Long Length of Stay Meetings, dates discussed:    Additional Comments:  Ella Bodo, RN 04/02/2017, 2:47 PM

## 2017-04-02 NOTE — H&P (Signed)
History and Physical    Lorraine [name] NGE:[id number] DOB: 03-02-1986 DOA: 04/02/2017   PCP: Patient, No Pcp Per   Attending physician: Marily Memos  Patient coming from/Resides with: Private residence  Chief Complaint: Abdominal pain with nausea and vomiting  HPI: Lorraine [name] is a 31 y.o. female with medical history significant for diverticulitis, asthma, obesity, who presents to the ER with 3 days of intractable nausea and vomiting associated with left upper quadrant abdominal pain. No blood in emesis. No diarrhea. Subjective fevers or chills. The pain does radiate to her back. She has been unable to eat or drink. Lipase was normal. LFTs normal. Prior history of cholecystectomy. CT imaging reveals mild peripancreatic stranding involving the uncinate process of the pancreas.  ED Course:  Vital Signs: BP 118/85   Pulse 87   Temp 98.4 F (36.9 C) (Oral)   Resp 18   Ht 5' (1.524 m)   SpO2 99%  CT abdomen and pelvis: As above; diffuse that infiltration of the liver without intrahepatic biliary ductal dilatation, sigmoid diverticulosis without active diverticulitis.; No evidence of bowel obstruction Lab data: Sodium 137, potassium 3.8, chloride 104, CO2 25, glucose 12, BUN 10, creatinine 0.94, LFTs normal, calcium 8.9, lipase 31, hCG beta chain quantitative, S <1, white count 9300 differential not obtained, hemoglobin 12.4, platelets 273,000, urinalysis unremarkable except for hazy appearance and borderline elevated specific gravity 1.020 Medications and treatments: Zofran 4 mg SL tablet 1, Zofran 4 mg IV 1, morphine 4 mg IV 3,  NS bolus 1 L 3, Phenergan 12.5 mg IV 1, NS infusion at 150/hr  Review of Systems:  In addition to the HPI above,  No Headache, changes with Vision or hearing, new weakness, tingling, numbness in any extremity, dysarthria or word finding difficulty, gait disturbance or imbalance, tremors or seizure activity No problems swallowing food or Liquids,  indigestion/reflux, choking or coughing while eating No Chest pain, Cough or Shortness of Breath, palpitations, orthopnea or DOE No melena,hematochezia, dark tarry stools, constipation No dysuria, malodorous urine, hematuria or flank pain No new skin rashes, lesions, masses or bruises, No new joint pains, aches, swelling or redness No recent unintentional weight gain or loss No polyuria, polydypsia or polyphagia   Past Medical History:  Diagnosis Date  . Asthma    Inhaler used 01/02/16  . Diverticulitis   . Pyelonephritis   . Pyelonephritis     Past Surgical History:  Procedure Laterality Date  . CESAREAN SECTION    . CESAREAN SECTION N/A 2006  . CESAREAN SECTION N/A 09/03/2014   Procedure: CESAREAN SECTION;  Surgeon: Guss Bunde, MD;  Location: [facility];  Service: Obstetrics;  Laterality: N/A;  . CHOLECYSTECTOMY    . TUBAL LIGATION      Social History   Social History  . Marital status: Married    Spouse name: N/A  . Number of children: N/A  . Years of education: N/A   Occupational History  . Not on file.   Social History Main Topics  . Smoking status: Never Smoker  . Smokeless tobacco: Never Used  . Alcohol use No  . Drug use: No  . Sexual activity: Not on file   Other Topics Concern  . Not on file   Social History Narrative   ** Merged History Encounter **        Mobility: Independent Work history: Not obtained   No Known Allergies  Family History  Problem Relation Age of Onset  . Hyperlipidemia Mother   . Diabetes Maternal  Uncle   . Diabetes Paternal Grandmother      Prior to Admission medications   Medication Sig Start Date End Date Taking? Authorizing Provider  albuterol (PROVENTIL HFA;VENTOLIN HFA) 108 (90 Base) MCG/ACT inhaler Inhale 1-2 puffs into the lungs every 6 (six) hours as needed for wheezing or shortness of breath.   Yes [provider]  cyclobenzaprine (FLEXERIL) 10 MG tablet Take 1 tablet (10 mg total) by mouth 3  (three) times daily as needed for muscle spasms. Patient not taking: Reported on 04/02/2017 02/06/17   Orpah Greek, MD  naproxen (NAPROSYN) 500 MG tablet Take 1 tablet (500 mg total) by mouth 2 (two) times daily. Patient not taking: Reported on 04/02/2017 02/06/17   Orpah Greek, MD  predniSONE (STERAPRED UNI-PAK 21 TAB) 10 MG (21) TBPK tablet Take by mouth daily. Take 6 tabs by mouth daily  for 2 days, then 5 tabs for 2 days, then 4 tabs for 2 days, then 3 tabs for 2 days, 2 tabs for 2 days, then 1 tab by mouth daily for 2 days Patient not taking: Reported on 04/02/2017 02/03/17   Barnet Glasgow, NP  traMADol (ULTRAM) 50 MG tablet Take 1 tablet (50 mg total) by mouth every 6 (six) hours as needed. Patient not taking: Reported on 04/02/2017 02/06/17   Orpah Greek, MD    Physical Exam: Vitals:   04/02/17 0557 04/02/17 0600 04/02/17 0630 04/02/17 0730  BP: 116/84 116/75 115/71 118/85  Pulse: 94 98 88 87  Resp: 18     Temp:      TempSrc:      SpO2: 99% 98% 98% 99%  Height:          Constitutional: NAD, calm, uncomfortable 2/2 ongoing nausea and abdominal pain Eyes: PERRL, lids and conjunctivae normal ENMT: Mucous membranes are dry. Posterior pharynx clear of any exudate or lesions.Normal dentition.  Neck: normal, supple, no masses, no thyromegaly Respiratory: clear to auscultation bilaterally, no wheezing, no crackles. Normal respiratory effort. No accessory muscle use.  Cardiovascular: Regular rate and rhythm, no murmurs / rubs / gallops. No extremity edema. 2+ pedal pulses. No carotid bruits.  Abdomen: Focal LUQ tenderness reproducible palpation, no masses palpated. No hepatosplenomegaly. Bowel sounds positive but hypoactive. Abdomen soft and nondistended  Musculoskeletal: no clubbing / cyanosis. No joint deformity upper and lower extremities. Good ROM, no contractures. Normal muscle tone.  Skin: no rashes, lesions, ulcers. No induration Neurologic: CN 2-12  grossly intact. Sensation intact, DTR normal. Strength 5/5 x all 4 extremities.  Psychiatric: Appears to have normal judgment and insight. Alert and oriented x 3. Normal mood. Evaluation somewhat limited by language barrier.   Labs on Admission: I have personally reviewed following labs and imaging studies  CBC:  Recent Labs Lab 04/01/17 2209  WBC 9.3  HGB 12.4  HCT 37.9  MCV 90.5  PLT 161   Basic Metabolic Panel:  Recent Labs Lab 04/01/17 2209  NA 137  K 3.8  CL 104  CO2 25  GLUCOSE 102*  BUN 10  CREATININE 0.94  CALCIUM 8.9   GFR: CrCl cannot be calculated (Unknown ideal weight.). Liver Function Tests:  Recent Labs Lab 04/01/17 2209  AST 37  ALT 38  ALKPHOS 80  BILITOT 0.4  PROT 6.9  ALBUMIN 4.0    Recent Labs Lab 04/01/17 2209  LIPASE 31   No results for input(s): AMMONIA in the last 168 hours. Coagulation Profile: No results for input(s): INR, PROTIME in the last 168  hours. Cardiac Enzymes: No results for input(s): CKTOTAL, CKMB, CKMBINDEX, TROPONINI in the last 168 hours. BNP (last 3 results) No results for input(s): PROBNP in the last 8760 hours. HbA1C: No results for input(s): HGBA1C in the last 72 hours. CBG: No results for input(s): GLUCAP in the last 168 hours. Lipid Profile: No results for input(s): CHOL, HDL, LDLCALC, TRIG, CHOLHDL, LDLDIRECT in the last 72 hours. Thyroid Function Tests: No results for input(s): TSH, T4TOTAL, FREET4, T3FREE, THYROIDAB in the last 72 hours. Anemia Panel: No results for input(s): VITAMINB12, FOLATE, FERRITIN, TIBC, IRON, RETICCTPCT in the last 72 hours. Urine analysis:    Component Value Date/Time   COLORURINE YELLOW 04/01/2017 2152   APPEARANCEUR HAZY (A) 04/01/2017 2152   LABSPEC 1.020 04/01/2017 2152   PHURINE 5.0 04/01/2017 2152   GLUCOSEU NEGATIVE 04/01/2017 2152   HGBUR NEGATIVE 04/01/2017 2152   BILIRUBINUR NEGATIVE 04/01/2017 2152   BILIRUBINUR neg 07/15/2015 1026   KETONESUR NEGATIVE  04/01/2017 2152   PROTEINUR NEGATIVE 04/01/2017 2152   UROBILINOGEN 0.2 08/28/2016 1200   NITRITE NEGATIVE 04/01/2017 2152   LEUKOCYTESUR NEGATIVE 04/01/2017 2152   Sepsis Labs: @LABRCNTIP (procalcitonin:4,lacticidven:4) )No results found for this or any previous visit (from the past 240 hour(s)).   Radiological Exams on Admission: Ct Abdomen Pelvis W Contrast  Result Date: 04/02/2017 CLINICAL DATA:  31 year old female with lower abdominal pain. EXAM: CT ABDOMEN AND PELVIS WITH CONTRAST TECHNIQUE: Multidetector CT imaging of the abdomen and pelvis was performed using the standard protocol following bolus administration of intravenous contrast. CONTRAST:  182mL ISOVUE-300 IOPAMIDOL (ISOVUE-300) INJECTION 61% COMPARISON:  Abdominal CT dated 08/30/2016 FINDINGS: Lower chest: The visualized lung bases are clear. No intra-abdominal free air or free fluid. Hepatobiliary: There is diffuse fatty infiltration of the liver. No intrahepatic biliary ductal dilatation. Cholecystectomy. Pancreas: There is mild peripancreatic stranding primarily involving the uncinate process of the pancreas. Correlation with pancreatic enzymes recommended. No abscess. Spleen: Choose Adrenals/Urinary Tract: Adrenal glands are unremarkable. Kidneys are normal, without renal calculi, focal lesion, or hydronephrosis. Bladder is unremarkable. Stomach/Bowel: There is sigmoid diverticulosis without active inflammatory changes. No bowel obstruction. Normal appendix. Vascular/Lymphatic: No significant vascular findings are present. No enlarged abdominal or pelvic lymph nodes. Reproductive: The uterus is anteverted and grossly unremarkable. Bilateral tubal ligation clips noted. There is a 2 cm right ovarian dominant follicle. Other: Small fat containing umbilical hernia. Musculoskeletal: No acute or significant osseous findings. IMPRESSION: 1. Acute pancreatitis.  No abscess. 2. Sigmoid diverticulosis. No bowel obstruction or active  inflammation. Normal appendix. Electronically Signed   By: Anner Crete M.D.   On: 04/02/2017 03:15     Assessment/Plan Principal Problem:   Intractable nausea and vomiting/Abdominal pain, acute, left upper quadrant -Presents with intractable nausea and vomiting with progressive LUQ and abdominal pain x 3 days -CT with mild. Pancreatic stranding concerning for pancreatitis but lipase normal -Prior cholecystectomy/LFTs normal/no intra-or extrahepatic ductal dilatation -Denies alcohol-check ETOH level/UDS -Has fatty infiltration of liver-lipid panel -No new medications -Calcium is normal -? Viral etiology -Symptom management: IV fluids, scheduled Toradol IV, morphine IV prn, bowel rest, IV PPI q 12 hrs, Zofran/Phenergan IV -Follow labs; repeat lipase in a.m. -Routine HIV screening  Active Problems:   Asthma in adult -Currently asymptomatic -Continue home MDI    Fatty infiltration of liver -Noted on CT abdomen/pelvis -LFTs are within normal limits    Obesity (BMI 30.0-34.9) -Will need eventual nutrition counseling regarding weight reduction      DVT prophylaxis: Lovenox  Code Status: Full  Family  Communication: No family at bedside Disposition Plan: Home Consults called: None     ELLIS,ALLISON L. ANP-BC [facility] Pager [phone]   If 7PM-7AM, please contact night-coverage [email or web address] Password [email or web address]  04/02/2017, 7:40 AM

## 2017-04-02 NOTE — Progress Notes (Addendum)
Ms. Shrode is a 31 y/o female with a PMH of asthma, pyelonephritis, s/p cholecystectomy, and diverticulitis; who presents with complaints of abdominal pain with nausea and vomiting over the last 2 days. Lab work was relatively unremarkable including lipase and liver enzymes. UA negative for signs of infection. CT scan of the abdomen shows signs of pancreatitis.  Patient was given morphine, 2L of NS IVF, Zofran, Phenergan, Reglan, and Benadryl without relief of symptoms. [facility] called to admit for intractable nausea and vomiting and acute pancreatitis of unclear cause.

## 2017-04-02 NOTE — ED Provider Notes (Signed)
[facility] DEPT Provider Note   CSN: [id number] Arrival date & time: 04/01/17  2139     History   Chief Complaint Chief Complaint  Patient presents with  . Abdominal Pain  . Emesis    HPI Lorraine [name] is a 31 y.o. female G5 P0 323 with history of diverticulitis presenting with left-sided abdominal pain sharp and crampy radiating to her back which started last night. She reports a progressive onset and worsening and the pain has been constant with intermittent worsening. She reports multiple episodes of vomiting including 10 times today. She denies trying anything for the symptoms. No alleviating factors. She also endorses pain with urination denies hematuria. Also endorses subjective fever and chills. She reports a normal bowel movement today, denies diarrhea or blood in her stool.  LMP ended a week and a half ago. Prior surgeries include cholecystectomy and tubal ligation. Spanish interpreter used during this encounter HPI  Past Medical History:  Diagnosis Date  . Asthma    Inhaler used 01/02/16  . Diverticulitis   . Pyelonephritis   . Pyelonephritis     Patient Active Problem List   Diagnosis Date Noted  . Pre-diabetes 07/15/2015  . Diarrhea   . Hypokalemia   . Bacterial vaginosis   . Pyelonephritis 07/07/2015  . Abdominal pain, right upper quadrant   . Asthma 10/31/2014  . History of preterm delivery 10/22/2014  . Status post repeat low transverse cesarean section 09/06/2014  . Diverticulitis 09/10/2012    Past Surgical History:  Procedure Laterality Date  . CESAREAN SECTION    . CESAREAN SECTION N/A 2006  . CESAREAN SECTION N/A 09/03/2014   Procedure: CESAREAN SECTION;  Surgeon: Guss Bunde, MD;  Location: [facility];  Service: Obstetrics;  Laterality: N/A;  . CHOLECYSTECTOMY    . TUBAL LIGATION      OB History    Gravida Para Term Preterm AB Living   5 3 1 2 2 2    SAB TAB Ectopic Multiple Live Births   2     1 2        Home Medications     Prior to Admission medications   Medication Sig Start Date End Date Taking? Authorizing Provider  albuterol (PROVENTIL HFA;VENTOLIN HFA) 108 (90 Base) MCG/ACT inhaler Inhale 1-2 puffs into the lungs every 6 (six) hours as needed for wheezing or shortness of breath.   Yes [provider]  cyclobenzaprine (FLEXERIL) 10 MG tablet Take 1 tablet (10 mg total) by mouth 3 (three) times daily as needed for muscle spasms. Patient not taking: Reported on 04/02/2017 02/06/17   Orpah Greek, MD  naproxen (NAPROSYN) 500 MG tablet Take 1 tablet (500 mg total) by mouth 2 (two) times daily. Patient not taking: Reported on 04/02/2017 02/06/17   Orpah Greek, MD  predniSONE (STERAPRED UNI-PAK 21 TAB) 10 MG (21) TBPK tablet Take by mouth daily. Take 6 tabs by mouth daily  for 2 days, then 5 tabs for 2 days, then 4 tabs for 2 days, then 3 tabs for 2 days, 2 tabs for 2 days, then 1 tab by mouth daily for 2 days Patient not taking: Reported on 04/02/2017 02/03/17   Barnet Glasgow, NP  traMADol (ULTRAM) 50 MG tablet Take 1 tablet (50 mg total) by mouth every 6 (six) hours as needed. Patient not taking: Reported on 04/02/2017 02/06/17   Orpah Greek, MD    Family History Family History  Problem Relation Age of Onset  . Hyperlipidemia Mother   .  Diabetes Maternal Uncle   . Diabetes Paternal Grandmother     Social History Social History  Substance Use Topics  . Smoking status: Never Smoker  . Smokeless tobacco: Never Used  . Alcohol use No     Allergies   Patient has no known allergies.   Review of Systems Review of Systems  Constitutional: Positive for chills and fever.  HENT: Negative for ear pain and sore throat.   Eyes: Negative for pain and visual disturbance.  Respiratory: Negative for cough, choking, chest tightness, shortness of breath and stridor.   Cardiovascular: Negative for chest pain, palpitations and leg swelling.  Gastrointestinal: Positive for  abdominal pain, nausea and vomiting. Negative for abdominal distention, blood in stool and diarrhea.  Genitourinary: Positive for dysuria and vaginal discharge. Negative for hematuria.       She reports white vaginal discharge for a week  Musculoskeletal: Negative for arthralgias, back pain, myalgias, neck pain and neck stiffness.  Skin: Negative for color change, pallor and rash.  Neurological: Negative for dizziness, seizures, syncope and light-headedness.     Physical Exam Updated Vital Signs BP 116/84 (BP Location: Left Arm)   Pulse 94   Temp 98.4 F (36.9 C) (Oral)   Resp 18   Ht 5' (1.524 m)   SpO2 99%   Physical Exam  Constitutional: She appears well-developed and well-nourished. No distress.  Afebrile, nontoxic-appearing, lying uncomfortably in bed in no acute distress.  HENT:  Head: Normocephalic and atraumatic.  Eyes: Conjunctivae and EOM are normal.  Neck: Normal range of motion.  Cardiovascular: Normal rate, regular rhythm and normal heart sounds.   No murmur heard. Pulmonary/Chest: Effort normal and breath sounds normal. No respiratory distress.  Abdominal: Soft. Bowel sounds are normal. She exhibits no distension and no mass. There is tenderness. There is no guarding.   Flat contour, active bowel sounds. abdomen is supple and tender to both light and deep palpation and no rebound tenderness. No palpated masses.  No costovertebral angle tenderness. Negative murphy's sign Positiv McBurney's point tenderness  Patient is tender to palpation on the right lower quadrant and left. She reports pain in the right lower quadrant when palpating the left.   Musculoskeletal: Normal range of motion. She exhibits no edema or deformity.  Neurological: She is alert.  Skin: Skin is warm and dry. No rash noted. She is not diaphoretic. No erythema. No pallor.  Psychiatric: She has a normal mood and affect.  Nursing note and vitals reviewed.    ED Treatments / Results  Labs (all  labs ordered are listed, but only abnormal results are displayed) Labs Reviewed  COMPREHENSIVE METABOLIC PANEL - Abnormal; Notable for the following:       Result Value   Glucose, Bld 102 (*)    All other components within normal limits  URINALYSIS, ROUTINE W REFLEX MICROSCOPIC - Abnormal; Notable for the following:    APPearance HAZY (*)    All other components within normal limits  LIPASE, BLOOD  CBC  HCG, QUANTITATIVE, PREGNANCY    EKG  EKG Interpretation None       Radiology Ct Abdomen Pelvis W Contrast  Result Date: 04/02/2017 CLINICAL DATA:  31 year old female with lower abdominal pain. EXAM: CT ABDOMEN AND PELVIS WITH CONTRAST TECHNIQUE: Multidetector CT imaging of the abdomen and pelvis was performed using the standard protocol following bolus administration of intravenous contrast. CONTRAST:  125mL ISOVUE-300 IOPAMIDOL (ISOVUE-300) INJECTION 61% COMPARISON:  Abdominal CT dated 08/30/2016 FINDINGS: Lower chest: The visualized lung bases  are clear. No intra-abdominal free air or free fluid. Hepatobiliary: There is diffuse fatty infiltration of the liver. No intrahepatic biliary ductal dilatation. Cholecystectomy. Pancreas: There is mild peripancreatic stranding primarily involving the uncinate process of the pancreas. Correlation with pancreatic enzymes recommended. No abscess. Spleen: Choose Adrenals/Urinary Tract: Adrenal glands are unremarkable. Kidneys are normal, without renal calculi, focal lesion, or hydronephrosis. Bladder is unremarkable. Stomach/Bowel: There is sigmoid diverticulosis without active inflammatory changes. No bowel obstruction. Normal appendix. Vascular/Lymphatic: No significant vascular findings are present. No enlarged abdominal or pelvic lymph nodes. Reproductive: The uterus is anteverted and grossly unremarkable. Bilateral tubal ligation clips noted. There is a 2 cm right ovarian dominant follicle. Other: Small fat containing umbilical hernia.  Musculoskeletal: No acute or significant osseous findings. IMPRESSION: 1. Acute pancreatitis.  No abscess. 2. Sigmoid diverticulosis. No bowel obstruction or active inflammation. Normal appendix. Electronically Signed   By: Anner Crete M.D.   On: 04/02/2017 03:15    Procedures Procedures (including critical care time)  Medications Ordered in ED Medications  iopamidol (ISOVUE-300) 61 % injection (not administered)  metoCLOPramide (REGLAN) injection 10 mg (0 mg Intravenous Hold 04/02/17 0602)  diphenhydrAMINE (BENADRYL) injection 25 mg (0 mg Intravenous Hold 04/02/17 0603)  0.9 %  sodium chloride infusion (not administered)  ondansetron (ZOFRAN-ODT) disintegrating tablet 4 mg (4 mg Oral Given 04/01/17 2215)  ondansetron (ZOFRAN) injection 4 mg (4 mg Intravenous Given 04/02/17 0212)  morphine 4 MG/ML injection 4 mg (4 mg Intravenous Given 04/02/17 0212)  sodium chloride 0.9 % bolus 1,000 mL (0 mLs Intravenous Stopped 04/02/17 0303)  iopamidol (ISOVUE-300) 61 % injection (100 mLs  Contrast Given 04/02/17 0253)  morphine 4 MG/ML injection 4 mg (4 mg Intravenous Given 04/02/17 0426)  promethazine (PHENERGAN) injection 12.5 mg (12.5 mg Intravenous Given 04/02/17 0430)  sodium chloride 0.9 % bolus 1,000 mL (1,000 mLs Intravenous New Bag/Given 04/02/17 0603)     Initial Impression / Assessment and Plan / ED Course  I have reviewed the triage vital signs and the nursing notes.  Pertinent labs & imaging results that were available during my care of the patient were reviewed by me and considered in my medical decision making (see chart for details).     Patient with history of diverticulitis and cholecystitis status post cholecystectomy presenting with sudden onset left lower quadrant pain somewhat radiating towards the back and now moving to the right lower quadrant. Associated nausea and vomiting, no diarrhea or blood in her stool.  Ordered IV fluids, analgesia, anti-emetics and CT abdomen and  pelvis  Will reassess after analgesia Reassessment, patient reported no improvement. Continues to report nausea. Treated with multiple doses of analgesics and anti-emetic.  CT with evidence of acute pancreatitis without abscess. No other acute abnormalities. Lipase normal. Patient given IV fluids and reassessed multiple times with poor symptomatic control.  Will call for admission for observation and symptoms control. Spoke to admitting physician and patient will be admitted.  Final Clinical Impressions(s) / ED Diagnoses   Final diagnoses:  Intractable vomiting with nausea, unspecified vomiting type  Periumbilical abdominal pain  Acute pancreatitis, unspecified complication status, unspecified pancreatitis type    New Prescriptions New Prescriptions   No medications on file     Dossie Der 04/02/17 0617    Merryl Hacker, MD 04/02/17 [phone]

## 2017-04-02 NOTE — Progress Notes (Signed)
Interpreter Graciela Namihira for patient °

## 2017-04-03 DIAGNOSIS — K859 Acute pancreatitis without necrosis or infection, unspecified: Secondary | ICD-10-CM | POA: Diagnosis present

## 2017-04-03 DIAGNOSIS — N83201 Unspecified ovarian cyst, right side: Secondary | ICD-10-CM | POA: Diagnosis present

## 2017-04-03 DIAGNOSIS — K760 Fatty (change of) liver, not elsewhere classified: Secondary | ICD-10-CM | POA: Diagnosis present

## 2017-04-03 DIAGNOSIS — Z6835 Body mass index (BMI) 35.0-35.9, adult: Secondary | ICD-10-CM | POA: Diagnosis not present

## 2017-04-03 DIAGNOSIS — J45909 Unspecified asthma, uncomplicated: Secondary | ICD-10-CM | POA: Diagnosis present

## 2017-04-03 DIAGNOSIS — R1033 Periumbilical pain: Secondary | ICD-10-CM | POA: Diagnosis present

## 2017-04-03 DIAGNOSIS — Z79899 Other long term (current) drug therapy: Secondary | ICD-10-CM | POA: Diagnosis not present

## 2017-04-03 DIAGNOSIS — K219 Gastro-esophageal reflux disease without esophagitis: Secondary | ICD-10-CM | POA: Diagnosis present

## 2017-04-03 DIAGNOSIS — N920 Excessive and frequent menstruation with regular cycle: Secondary | ICD-10-CM | POA: Diagnosis present

## 2017-04-03 DIAGNOSIS — R066 Hiccough: Secondary | ICD-10-CM | POA: Diagnosis not present

## 2017-04-03 DIAGNOSIS — K2970 Gastritis, unspecified, without bleeding: Secondary | ICD-10-CM | POA: Diagnosis present

## 2017-04-03 DIAGNOSIS — R112 Nausea with vomiting, unspecified: Secondary | ICD-10-CM | POA: Diagnosis not present

## 2017-04-03 DIAGNOSIS — K5730 Diverticulosis of large intestine without perforation or abscess without bleeding: Secondary | ICD-10-CM | POA: Diagnosis present

## 2017-04-03 LAB — HIV ANTIBODY (ROUTINE TESTING W REFLEX): HIV SCREEN 4TH GENERATION: NONREACTIVE

## 2017-04-03 LAB — COMPREHENSIVE METABOLIC PANEL
ALT: 34 U/L (ref 14–54)
ANION GAP: 6 (ref 5–15)
AST: 31 U/L (ref 15–41)
Albumin: 3.6 g/dL (ref 3.5–5.0)
Alkaline Phosphatase: 64 U/L (ref 38–126)
BILIRUBIN TOTAL: 0.7 mg/dL (ref 0.3–1.2)
CHLORIDE: 108 mmol/L (ref 101–111)
CO2: 22 mmol/L (ref 22–32)
Calcium: 8.3 mg/dL — ABNORMAL LOW (ref 8.9–10.3)
Creatinine, Ser: 0.63 mg/dL (ref 0.44–1.00)
Glucose, Bld: 85 mg/dL (ref 65–99)
POTASSIUM: 3.6 mmol/L (ref 3.5–5.1)
Sodium: 136 mmol/L (ref 135–145)
TOTAL PROTEIN: 6.6 g/dL (ref 6.5–8.1)

## 2017-04-03 LAB — URINALYSIS, ROUTINE W REFLEX MICROSCOPIC
Bilirubin Urine: NEGATIVE
Glucose, UA: NEGATIVE mg/dL
HGB URINE DIPSTICK: NEGATIVE
Ketones, ur: 20 mg/dL — AB
Leukocytes, UA: NEGATIVE
NITRITE: NEGATIVE
PROTEIN: NEGATIVE mg/dL
SPECIFIC GRAVITY, URINE: 1.006 (ref 1.005–1.030)
pH: 6 (ref 5.0–8.0)

## 2017-04-03 LAB — CBC
HEMATOCRIT: 34.2 % — AB (ref 36.0–46.0)
HEMOGLOBIN: 11.4 g/dL — AB (ref 12.0–15.0)
MCH: 30.1 pg (ref 26.0–34.0)
MCHC: 33.3 g/dL (ref 30.0–36.0)
MCV: 90.2 fL (ref 78.0–100.0)
Platelets: 242 10*3/uL (ref 150–400)
RBC: 3.79 MIL/uL — AB (ref 3.87–5.11)
RDW: 12.7 % (ref 11.5–15.5)
WBC: 6.5 10*3/uL (ref 4.0–10.5)

## 2017-04-03 LAB — LIPASE, BLOOD: Lipase: 20 U/L (ref 11–51)

## 2017-04-03 LAB — PHOSPHORUS: PHOSPHORUS: 2.4 mg/dL — AB (ref 2.5–4.6)

## 2017-04-03 NOTE — Progress Notes (Signed)
Met with pt to discuss issues with medication affordability, using [employer] Interpreter.  Patient states she has PCP at [facility].  She gets her Rx filled at [facility].  Pt states that she has not been receiving Medicaid cards on a regular basis, but [facility] states she has Medicaid.  Per hospital financial counselor assessment, pt has active [place] Medicaid.   Advised pt to utilize pharmacy at [facility].  Meds are generally reduced in price, and they do assist patients with drug company assistance programs if needed.  She states she will do this.   Recommend hospital follow up appointment post-hospitalization.     W. , RN, BSN  Trauma/Neuro ICU Case Manager [phone] 

## 2017-04-03 NOTE — Progress Notes (Signed)
Initial Nutrition Assessment  DOCUMENTATION CODES:   Obesity unspecified  INTERVENTION:   Ensure Enlive po BID, each supplement provides 350 kcal and 20 grams of protein   NUTRITION DIAGNOSIS:   Inadequate oral intake related to acute illness as evidenced by meal completion < 25%.  GOAL:   Patient will meet greater than or equal to 90% of their needs  MONITOR:   PO intake, Supplement acceptance, Labs, Weight trends  REASON FOR ASSESSMENT:   Malnutrition Screening Tool    ASSESSMENT:   32 yo female admitted with N/V with acute pancreatitis. Pt with hx of cholecystectomy, diverticulitis  Recorded po intake 0% of meals  Per weight encounters, weight has been stable over the past year  Unable to complete Nutrition-Focused physical exam at this time.   Labs: phosphorus 2.4 (supplemented) Meds: NS at 100 ml/hr, reglan   Diet Order:  DIET SOFT Room service appropriate? Yes; Fluid consistency: Thin  Skin:  Reviewed, no issues  Last BM:  7/17  Height:   Ht Readings from Last 1 Encounters:  04/02/17 5' (1.524 m)    Weight:   Wt Readings from Last 1 Encounters:  04/02/17 182 lb 8.7 oz (82.8 kg)    Ideal Body Weight:  45.5 kg  BMI:  Body mass index is 35.65 kg/m.  Estimated Nutritional Needs:   Kcal:  5462-7035 kcals  Protein:  88-103 g  Fluid:  >/= 1.8 L  EDUCATION NEEDS:   No education needs identified at this time  [place], [place], LDN [phone] Pager  [phone] Weekend/On-Call Pager

## 2017-04-03 NOTE — Progress Notes (Signed)
PROGRESS NOTE    Lorraine [name]  SHF:[id number] DOB: 08-15-1986 DOA: 04/02/2017 PCP: Patient, No Pcp Per  Outpatient Specialists:     Brief Narrative:  31 year old Spanish-speaking [interviewed with the interpreter] Gravida 3 para 3--has had tubal ligation bilaterally Morbid obesity Reflux Multiple prior episodes of pyelonephritis with admission and 2016  Also has been admitted in past for diverticulitis 2013 Prior cholecystectomy about 10 years prior Admitted with pain in abdomen radiating to back-worsens with moving around Found to have acute pancreatitis on admission    Assessment & Plan:   Principal Problem:   Intractable nausea and vomiting Active Problems:   Abdominal pain, acute, left upper quadrant   Asthma in adult   Obesity (BMI 30.0-34.9)   Fatty infiltration of liver   Gastroesophageal reflux disease without esophagitis   Cryptogenic pancreatitis  Has not had episodes of this before  Unclear what is precipitating however we will give IV fluids days on a soft diet and see tolerance of the same  She's not very hungry and had nausea this morning so we will give Zofran  We got a UA today because of the nature of her pain being in the left upper quadrant radiating to the back however she does not have any signs or symptoms of pyelonephritis at this time  Fatty liver  Never drinker, has had however cholecystectomy in the distant past  Possible she could've had a CBD stone which has passed?  CT scan does not show calcification so it is unlikely she has chronic pancreatitis causing pain  Obesity Stable at the present time  Hypermenorrhea  Tells me for the past 3 months she's been having 2 periods a month  I'm not sure if she should be worked up for endometriosis and we will monitor her level of pain. If there is no resolution we may need to ask gynecology to see  Inpatient Lovenox Unclear disposition     Subjective: Uncomfortable and not hungry    Felt nauseous this morning given Zofran No chest pain No nausea at present  Ambulating but has pain when she does this 6/10 down to 3/10 with meds and rest   Objective: Vitals:   04/02/17 1546 04/02/17 2249 04/03/17 0614 04/03/17 1413  BP: 112/74 (!) 106/51 116/70 109/68  Pulse: 82 73 76 73  Resp: 18 18 18 18   Temp: 98.5 F (36.9 C) 98.6 F (37 C) 98.2 F (36.8 C) 98 F (36.7 C)  TempSrc: Oral Oral Oral Oral  SpO2: 100% 98% 99% 98%  Weight:      Height:        Intake/Output Summary (Last 24 hours) at 04/03/17 1706 Last data filed at 04/03/17 1457  Gross per 24 hour  Intake          3238.67 ml  Output              840 ml  Net          2398.67 ml   Filed Weights   04/02/17 0812  Weight: 82.8 kg (182 lb 8.7 oz)    Examination:  Mood & affect appropriate.  External ocular movements intact Obese S1-S2 no murmur rub or gallop Abdomen shows striae she is tender in the left upper quadrant with radiation around the back she has positive CVA tenderness No rebound no guarding Bowel sounds are heard S1-S2 no murmur rub or gallop Chest is clear   Data Reviewed: I have personally reviewed following labs and imaging studies  CBC:  Recent Labs Lab 04/01/17 2209 04/03/17 0439  WBC 9.3 6.5  HGB 12.4 11.4*  HCT 37.9 34.2*  MCV 90.5 90.2  PLT 273 836   Basic Metabolic Panel:  Recent Labs Lab 04/01/17 2209 04/02/17 0825 04/03/17 0439  NA 137  --  136  K 3.8  --  3.6  CL 104  --  108  CO2 25  --  22  GLUCOSE 102*  --  85  BUN 10  --  <5*  CREATININE 0.94  --  0.63  CALCIUM 8.9  --  8.3*  MG  --  1.8  --   PHOS  --  2.1* 2.4*   GFR: Estimated Creatinine Clearance: 98 mL/min (by C-G formula based on SCr of 0.63 mg/dL). Liver Function Tests:  Recent Labs Lab 04/01/17 2209 04/03/17 0439  AST 37 31  ALT 38 34  ALKPHOS 80 64  BILITOT 0.4 0.7  PROT 6.9 6.6  ALBUMIN 4.0 3.6    Recent Labs Lab 04/01/17 2209 04/03/17 0439  LIPASE 31 20   No  results for input(s): AMMONIA in the last 168 hours. Coagulation Profile: No results for input(s): INR, PROTIME in the last 168 hours. Cardiac Enzymes: No results for input(s): CKTOTAL, CKMB, CKMBINDEX, TROPONINI in the last 168 hours. BNP (last 3 results) No results for input(s): PROBNP in the last 8760 hours. HbA1C: No results for input(s): HGBA1C in the last 72 hours. CBG: No results for input(s): GLUCAP in the last 168 hours. Lipid Profile:  Recent Labs  04/02/17 0825  CHOL 174  HDL 46  LDLCALC 99  TRIG 146  CHOLHDL 3.8   Thyroid Function Tests: No results for input(s): TSH, T4TOTAL, FREET4, T3FREE, THYROIDAB in the last 72 hours. Anemia Panel: No results for input(s): VITAMINB12, FOLATE, FERRITIN, TIBC, IRON, RETICCTPCT in the last 72 hours. Urine analysis:    Component Value Date/Time   COLORURINE STRAW (A) 04/03/2017 1130   APPEARANCEUR CLEAR 04/03/2017 1130   LABSPEC 1.006 04/03/2017 1130   PHURINE 6.0 04/03/2017 1130   GLUCOSEU NEGATIVE 04/03/2017 1130   HGBUR NEGATIVE 04/03/2017 1130   BILIRUBINUR NEGATIVE 04/03/2017 1130   BILIRUBINUR neg 07/15/2015 1026   KETONESUR 20 (A) 04/03/2017 1130   PROTEINUR NEGATIVE 04/03/2017 1130   UROBILINOGEN 0.2 08/28/2016 1200   NITRITE NEGATIVE 04/03/2017 1130   LEUKOCYTESUR NEGATIVE 04/03/2017 1130   Sepsis Labs: @LABRCNTIP (procalcitonin:4,lacticidven:4)  )No results found for this or any previous visit (from the past 240 hour(s)).       Radiology Studies: Ct Abdomen Pelvis W Contrast  Result Date: 04/02/2017 CLINICAL DATA:  31 year old female with lower abdominal pain. EXAM: CT ABDOMEN AND PELVIS WITH CONTRAST TECHNIQUE: Multidetector CT imaging of the abdomen and pelvis was performed using the standard protocol following bolus administration of intravenous contrast. CONTRAST:  175mL ISOVUE-300 IOPAMIDOL (ISOVUE-300) INJECTION 61% COMPARISON:  Abdominal CT dated 08/30/2016 FINDINGS: Lower chest: The visualized lung  bases are clear. No intra-abdominal free air or free fluid. Hepatobiliary: There is diffuse fatty infiltration of the liver. No intrahepatic biliary ductal dilatation. Cholecystectomy. Pancreas: There is mild peripancreatic stranding primarily involving the uncinate process of the pancreas. Correlation with pancreatic enzymes recommended. No abscess. Spleen: Choose Adrenals/Urinary Tract: Adrenal glands are unremarkable. Kidneys are normal, without renal calculi, focal lesion, or hydronephrosis. Bladder is unremarkable. Stomach/Bowel: There is sigmoid diverticulosis without active inflammatory changes. No bowel obstruction. Normal appendix. Vascular/Lymphatic: No significant vascular findings are present. No enlarged abdominal or pelvic lymph nodes. Reproductive: The uterus is anteverted and  grossly unremarkable. Bilateral tubal ligation clips noted. There is a 2 cm right ovarian dominant follicle. Other: Small fat containing umbilical hernia. Musculoskeletal: No acute or significant osseous findings. IMPRESSION: 1. Acute pancreatitis.  No abscess. 2. Sigmoid diverticulosis. No bowel obstruction or active inflammation. Normal appendix. Electronically Signed   By: Anner Crete M.D.   On: 04/02/2017 03:15        Scheduled Meds: . diphenhydrAMINE  25 mg Intravenous Once  . enoxaparin (LOVENOX) injection  40 mg Subcutaneous Daily  . ketorolac  15 mg Intravenous Q6H  . metoCLOPramide (REGLAN) injection  10 mg Intravenous Once  . pantoprazole (PROTONIX) IV  40 mg Intravenous Q12H   Continuous Infusions: . sodium chloride 100 mL/hr at 04/03/17 1147     LOS: 0 days    Time spent: [place], MD [facility] ([facility]   If 7PM-7AM, please contact night-coverage [email or web address] Password [facility] 04/03/2017, 5:06 PM

## 2017-04-04 MED ORDER — PANTOPRAZOLE SODIUM 40 MG PO TBEC
40.0000 mg | DELAYED_RELEASE_TABLET | Freq: Two times a day (BID) | ORAL | Status: DC
  Administered 2017-04-04 – 2017-04-09 (×9): 40 mg via ORAL
  Filled 2017-04-04 (×9): qty 1

## 2017-04-04 NOTE — Progress Notes (Signed)
Interpreter Lesle Chris Dr [place] April

## 2017-04-04 NOTE — Progress Notes (Signed)
Patient has been relatively uncomfortable all night with significant increased in pain between 21:00 and 23:00 last evening. This morning she declines pain medication but is guarding and grimacing at rest and worsened with movement. Patient had one episode of vomiting around 22:30 last night that was yellow and appeared to be undigested food. Pain worsened with PO intake so I did advise patient to have only ice chips overnight until rounding MD could evaluate in the AM. She has been compliant with that.

## 2017-04-04 NOTE — Progress Notes (Signed)
PROGRESS NOTE    Lorraine [name]  FOY:[id number] DOB: January 05, 1986 DOA: 04/02/2017 PCP: Patient, No Pcp Per  Outpatient Specialists:     Brief Narrative:  31 year old Spanish-speaking [interviewed with the interpreter] Gravida 3 para 3--has had tubal ligation bilaterally Morbid obesity Reflux Multiple prior episodes of pyelonephritis with admission and 2016  Also has been admitted in past for diverticulitis 2013 Prior cholecystectomy about 10 years prior Admitted with pain in abdomen radiating to back-worsens with moving around Found to have acute pancreatitis on admission   Slow to progress  Assessment & Plan:   Principal Problem:   Intractable nausea and vomiting Active Problems:   Acute pancreatitis   Abdominal pain, acute, left upper quadrant   Asthma in adult   Obesity (BMI 30.0-34.9)   Fatty infiltration of liver   Gastroesophageal reflux disease without esophagitis   Cryptogenic pancreatitis  Has not had episodes of this before  Only marginally improved and has had 3 episodes of nausea and vomiting 04/04/2017--if she does not rule out I would image her abdomen again with plain films and get GI involved  Continue for now Zofran  UA is negative for pyelonephritis  Fatty liver  Never drinker, has had however cholecystectomy in the distant past  not sure but could have had CBD stone which has passed  unlikely has chronic pancreatitis causing pain  Obesity Stable at the present time  Hypermenorrhea  Tells me for the past 3 months she's been having 2 periods a month  recommend outpatient GYN follow-up   Inpatient Lovenox Unclear disposition     Subjective:  Interviewed with interpreter doing fair but nauseous today and had a couple of episodes of vomiting Passing gas No chest pain No nausea when I saw her Little cough no cold no dysuria  Objective: Vitals:   04/03/17 1413 04/03/17 2052 04/04/17 0557 04/04/17 1120  BP: 109/68 108/70 119/74     Pulse: 73 74 86   Resp: 18 18 18    Temp: 98 F (36.7 C) 98.5 F (36.9 C) 98.5 F (36.9 C) 98.8 F (37.1 C)  TempSrc: Oral Oral Oral Oral  SpO2: 98% 99% 99% 98%  Weight:      Height:        Intake/Output Summary (Last 24 hours) at 04/04/17 1433 Last data filed at 04/04/17 0950  Gross per 24 hour  Intake          1372.67 ml  Output                0 ml  Net          1372.67 ml   Filed Weights   04/02/17 0812  Weight: 82.8 kg (182 lb 8.7 oz)    Examination:  Her countenence is improved   external ocular movements intact, chest clinically clear S1-S2 no murmur rub or gallop No submandibular lymphadenopathy Slightly tach cardiac Mild abdominal discomfort left upper quadrant No rebound or guarding No organomegaly Neurologically seems intact finger-nose-finger normal, reflexes are normal  Data Reviewed: I have personally reviewed following labs and imaging studies  CBC:  Recent Labs Lab 04/01/17 2209 04/03/17 0439  WBC 9.3 6.5  HGB 12.4 11.4*  HCT 37.9 34.2*  MCV 90.5 90.2  PLT 273 767   Basic Metabolic Panel:  Recent Labs Lab 04/01/17 2209 04/02/17 0825 04/03/17 0439  NA 137  --  136  K 3.8  --  3.6  CL 104  --  108  CO2 25  --  22  GLUCOSE 102*  --  85  BUN 10  --  <5*  CREATININE 0.94  --  0.63  CALCIUM 8.9  --  8.3*  MG  --  1.8  --   PHOS  --  2.1* 2.4*   GFR: Estimated Creatinine Clearance: 98 mL/min (by C-G formula based on SCr of 0.63 mg/dL). Liver Function Tests:  Recent Labs Lab 04/01/17 2209 04/03/17 0439  AST 37 31  ALT 38 34  ALKPHOS 80 64  BILITOT 0.4 0.7  PROT 6.9 6.6  ALBUMIN 4.0 3.6    Recent Labs Lab 04/01/17 2209 04/03/17 0439  LIPASE 31 20   No results for input(s): AMMONIA in the last 168 hours. Coagulation Profile: No results for input(s): INR, PROTIME in the last 168 hours. Cardiac Enzymes: No results for input(s): CKTOTAL, CKMB, CKMBINDEX, TROPONINI in the last 168 hours. BNP (last 3 results) No results  for input(s): PROBNP in the last 8760 hours. HbA1C: No results for input(s): HGBA1C in the last 72 hours. CBG: No results for input(s): GLUCAP in the last 168 hours. Lipid Profile:  Recent Labs  04/02/17 0825  CHOL 174  HDL 46  LDLCALC 99  TRIG 146  CHOLHDL 3.8   Thyroid Function Tests: No results for input(s): TSH, T4TOTAL, FREET4, T3FREE, THYROIDAB in the last 72 hours. Anemia Panel: No results for input(s): VITAMINB12, FOLATE, FERRITIN, TIBC, IRON, RETICCTPCT in the last 72 hours. Urine analysis:    Component Value Date/Time   COLORURINE STRAW (A) 04/03/2017 1130   APPEARANCEUR CLEAR 04/03/2017 1130   LABSPEC 1.006 04/03/2017 1130   PHURINE 6.0 04/03/2017 1130   GLUCOSEU NEGATIVE 04/03/2017 1130   HGBUR NEGATIVE 04/03/2017 1130   BILIRUBINUR NEGATIVE 04/03/2017 1130   BILIRUBINUR neg 07/15/2015 1026   KETONESUR 20 (A) 04/03/2017 1130   PROTEINUR NEGATIVE 04/03/2017 1130   UROBILINOGEN 0.2 08/28/2016 1200   NITRITE NEGATIVE 04/03/2017 1130   LEUKOCYTESUR NEGATIVE 04/03/2017 1130   Sepsis Labs: @LABRCNTIP (procalcitonin:4,lacticidven:4)  )No results found for this or any previous visit (from the past 240 hour(s)).       Radiology Studies: No results found.      Scheduled Meds: . diphenhydrAMINE  25 mg Intravenous Once  . enoxaparin (LOVENOX) injection  40 mg Subcutaneous Daily  . ketorolac  15 mg Intravenous Q6H  . metoCLOPramide (REGLAN) injection  10 mg Intravenous Once  . pantoprazole  40 mg Oral BID   Continuous Infusions: . sodium chloride 100 mL/hr at 04/03/17 1147     LOS: 1 day    Time spent: [place], MD [facility] (P) [phone]   If 7PM-7AM, please contact night-coverage [email or web address] Password [facility] 04/04/2017, 2:33 PM

## 2017-04-04 NOTE — Progress Notes (Signed)
Interpreter Lesle Chris for Randall NT

## 2017-04-05 ENCOUNTER — Inpatient Hospital Stay (HOSPITAL_COMMUNITY): Payer: Medicaid Other

## 2017-04-05 LAB — TRIGLYCERIDES: Triglycerides: 152 mg/dL — ABNORMAL HIGH (ref <150)

## 2017-04-05 MED ORDER — GADOBENATE DIMEGLUMINE 529 MG/ML IV SOLN
20.0000 mL | Freq: Once | INTRAVENOUS | Status: AC
  Administered 2017-04-05: 17 mL via INTRAVENOUS

## 2017-04-05 MED ORDER — LACTATED RINGERS IV SOLN
INTRAVENOUS | Status: DC
  Administered 2017-04-05 – 2017-04-08 (×9): via INTRAVENOUS

## 2017-04-05 MED ORDER — LORAZEPAM 2 MG/ML IJ SOLN
1.0000 mg | Freq: Once | INTRAMUSCULAR | Status: AC
  Administered 2017-04-05: 1 mg via INTRAVENOUS
  Filled 2017-04-05: qty 1

## 2017-04-05 NOTE — Progress Notes (Signed)
Patient awaken from LUQ abdominal pain, administered PRN pain medication morphine.  A few minutes later, pt. Called this RN that she vomitted. This RN responded and noted very scant clear yellowish emesis, apparently from saliva. Administered PRN Phenergan.  Will endorse to day shift RN appropriately.

## 2017-04-05 NOTE — Progress Notes (Signed)
Pt. Unable to take food due to nausea when taking food.  Administered PRN Phenergan and noted effective.  Complained also of LUQ abdominal pain and administered PRN pain medication Morphine 4mg  and noted effective since patient fell asleep.  Patient then awaken for same LUQ abd pain where PRN morphine was given.  At 0346, pt. Awaken with same LUQ pain with nausea and PRN morphine and zofran administered and noted pt. Asleep.  Will monitor.

## 2017-04-05 NOTE — Progress Notes (Signed)
PROGRESS NOTE    Lorraine [name]  OHY:[id number] DOB: [birth year] DOA: 04/02/2017 PCP: Patient, No Pcp Per  Outpatient Specialists:     Brief Narrative:  31 year old Spanish-speaking [interviewed with the interpreter] Gravida 3 para 3--has had tubal ligation bilaterally Morbid obesity Reflux Multiple prior episodes of pyelonephritis with admission and 2016  Also has been admitted in past for diverticulitis 2013 Prior cholecystectomy about 10 years prior Admitted with pain in abdomen radiating to back-worsens with moving around Found to have acute pancreatitis on admission   Slow to progress  Assessment & Plan:   Principal Problem:   Intractable nausea and vomiting Active Problems:   Acute pancreatitis   Abdominal pain, acute, left upper quadrant   Asthma in adult   Obesity (BMI 30.0-34.9)   Fatty infiltration of liver   Gastroesophageal reflux disease without esophagitis   Cryptogenic pancreatitis DDX viral versus autoimmune  Has not had episodes of this before  Consulted gastroenterology given persistence of issues with negative lipase  Follow IgG , MRCP, triglycerides are only 152 so this is not the possible etiology  GI recommending increasing IV fluid to 1 50 cc per hour and follow  Back down to ice chips  ? Needs EUS   Fatty liver  Never drinker, has had however cholecystectomy in the distant past  not sure but could have had CBD stone which has passed   Obesity Stable at the present time  Hypermenorrhea  Tells me for the past 3 months she's been having 2 periods a month  recommend outpatient GYN follow-up   Inpatient Lovenox Unclear disposition     Subjective:  Interviewed with interpreter Still doing poorly. Has continued pain No new other findings that not eating Family questions how long she'll be here Long discussion with them at the bedside today she ambulated in the hallway to some extent with her husband but felt dizzy at the  time   Objective: Vitals:   04/04/17 1435 04/04/17 2113 04/05/17 0516 04/05/17 1316  BP: 108/65 117/70 105/66 (!) 101/58  Pulse: 72 71 85 76  Resp: 18 18 18 18   Temp: 98.4 F (36.9 C) 98.1 F (36.7 C) 98.9 F (37.2 C) 98.4 F (36.9 C)  TempSrc: Oral Oral Oral Oral  SpO2: 98% 98% 98% 97%  Weight:      Height:        Intake/Output Summary (Last 24 hours) at 04/05/17 1547 Last data filed at 04/05/17 1458  Gross per 24 hour  Intake          2596.66 ml  Output                0 ml  Net          2596.66 ml   Filed Weights   04/02/17 0812  Weight: 82.8 kg (182 lb 8.7 oz)    Examination:  Alert in some painful distress Mucosa dry Anicteric no pallor Abdomen soft but slightly tender epigastrium, left upper quadrant Neurologically intact   Data Reviewed: I have personally reviewed following labs and imaging studies  CBC:  Recent Labs Lab 04/01/17 2209 04/03/17 0439  WBC 9.3 6.5  HGB 12.4 11.4*  HCT 37.9 34.2*  MCV 90.5 90.2  PLT 273 948   Basic Metabolic Panel:  Recent Labs Lab 04/01/17 2209 04/02/17 0825 04/03/17 0439  NA 137  --  136  K 3.8  --  3.6  CL 104  --  108  CO2 25  --  22  GLUCOSE  102*  --  85  BUN 10  --  <5*  CREATININE 0.94  --  0.63  CALCIUM 8.9  --  8.3*  MG  --  1.8  --   PHOS  --  2.1* 2.4*   GFR: Estimated Creatinine Clearance: 98 mL/min (by C-G formula based on SCr of 0.63 mg/dL). Liver Function Tests:  Recent Labs Lab 04/01/17 2209 04/03/17 0439  AST 37 31  ALT 38 34  ALKPHOS 80 64  BILITOT 0.4 0.7  PROT 6.9 6.6  ALBUMIN 4.0 3.6    Recent Labs Lab 04/01/17 2209 04/03/17 0439  LIPASE 31 20   No results for input(s): AMMONIA in the last 168 hours. Coagulation Profile: No results for input(s): INR, PROTIME in the last 168 hours. Cardiac Enzymes: No results for input(s): CKTOTAL, CKMB, CKMBINDEX, TROPONINI in the last 168 hours. BNP (last 3 results) No results for input(s): PROBNP in the last 8760  hours. HbA1C: No results for input(s): HGBA1C in the last 72 hours. CBG: No results for input(s): GLUCAP in the last 168 hours. Lipid Profile:  Recent Labs  04/05/17 1206  TRIG 152*   Thyroid Function Tests: No results for input(s): TSH, T4TOTAL, FREET4, T3FREE, THYROIDAB in the last 72 hours. Anemia Panel: No results for input(s): VITAMINB12, FOLATE, FERRITIN, TIBC, IRON, RETICCTPCT in the last 72 hours. Urine analysis:    Component Value Date/Time   COLORURINE STRAW (A) 04/03/2017 1130   APPEARANCEUR CLEAR 04/03/2017 1130   LABSPEC 1.006 04/03/2017 1130   PHURINE 6.0 04/03/2017 1130   GLUCOSEU NEGATIVE 04/03/2017 1130   HGBUR NEGATIVE 04/03/2017 1130   BILIRUBINUR NEGATIVE 04/03/2017 1130   BILIRUBINUR neg 07/15/2015 1026   KETONESUR 20 (A) 04/03/2017 1130   PROTEINUR NEGATIVE 04/03/2017 1130   UROBILINOGEN 0.2 08/28/2016 1200   NITRITE NEGATIVE 04/03/2017 1130   LEUKOCYTESUR NEGATIVE 04/03/2017 1130   Sepsis Labs: @LABRCNTIP (procalcitonin:4,lacticidven:4)  )No results found for this or any previous visit (from the past 240 hour(s)).       Radiology Studies: US Abdomen Complete  Result Date: 04/05/2017 CLINICAL DATA:  Pancreatitis. EXAM: ABDOMEN ULTRASOUND COMPLETE COMPARISON:  CT 04/02/2017 . FINDINGS: Gallbladder: Cholecystectomy. Common bile duct: Diameter: 5 mm Liver: Increased echogenicity consistent fatty infiltration and/or hepatocellular disease. No focal hepatic abnormality identified. IVC: No abnormality visualized. Pancreas: Visualized portion unremarkable. Spleen: Size and appearance within normal limits. Right Kidney: Length: 10.6 cm. Echogenicity within normal limits. No mass or hydronephrosis visualized. Left Kidney: Length: 9.6 cm. Echogenicity within normal limits. No mass or hydronephrosis visualized. Abdominal aorta: No aneurysm visualized. Other findings: None. IMPRESSION: 1. Increased hepatic echogenicity consistent with fatty infiltration and/or  hepatocellular disease. 2. Cholecystectomy.  No evidence of biliary distention . 3. Visualized portions of the pancreas appear normal. No focal pancreatic abnormality identified . Electronically Signed   By: Marcello Moores  Register   On: 04/05/2017 11:06        Scheduled Meds: . diphenhydrAMINE  25 mg Intravenous Once  . enoxaparin (LOVENOX) injection  40 mg Subcutaneous Daily  . metoCLOPramide (REGLAN) injection  10 mg Intravenous Once  . pantoprazole  40 mg Oral BID   Continuous Infusions: . lactated ringers 150 mL/hr at 04/05/17 1249     LOS: 2 days    Time spent: [place], MD [facility]   If 7PM-7AM, please contact night-coverage [email or web address] Password [email or web address] 04/05/2017, 3:47 PM

## 2017-04-05 NOTE — Consult Note (Signed)
[facility] Consult  Referring Provider: Nita Sells, MD Primary Care Physician:  Patient, No Pcp Per Primary Gastroenterologist: Althia Forts  Reason for Consultation:  Acute pancreatitis  HPI: Lorraine [name] is a 31 y.o. female originally from [place], was admitted on 04/02/2017 with complaints of abdominal pain. Patient describes it as being located in the upper abdomen, associated with nausea as well as vomiting, detailing to the right lower quadrant, worsened with any movement as well as lying flat, improves underlying still or increasing the head and elevation. Patient states she had prior symptoms 2 months ago, she was seen in the ER but was eventually discharged. She has lost about 20 pounds in the last 2 months. She denies use of alcohol abuse or alcohol binging. Gallbladder was removed in 2010 due to gallstones. He denies recently starting any new medications. She normally has regular bowel movements denies blood in stool or black colored stools. Currently her pain is controlled only when she gets pain medications otherwise are to 6 out of 10 in intensity, and she continues to have nausea and vomiting.   Past Medical History:  Diagnosis Date  . Asthma    Inhaler used 01/02/16  . Diverticulitis   . Nausea & vomiting 04/02/2017  . Pre-diabetes   . Pyelonephritis   . Pyelonephritis     Past Surgical History:  Procedure Laterality Date  . CESAREAN SECTION    . CESAREAN SECTION N/A 2006  . CESAREAN SECTION N/A 09/03/2014   Procedure: CESAREAN SECTION;  Surgeon: Guss Bunde, MD;  Location: [facility];  Service: Obstetrics;  Laterality: N/A;  . CHOLECYSTECTOMY    . TUBAL LIGATION      Prior to Admission medications   Medication Sig Start Date End Date Taking? Authorizing Provider  albuterol (PROVENTIL HFA;VENTOLIN HFA) 108 (90 Base) MCG/ACT inhaler Inhale 1-2 puffs into the lungs every 6 (six) hours as needed for wheezing or shortness of breath.   Yes  [provider]  cyclobenzaprine (FLEXERIL) 10 MG tablet Take 1 tablet (10 mg total) by mouth 3 (three) times daily as needed for muscle spasms. Patient not taking: Reported on 04/02/2017 02/06/17   Orpah Greek, MD  naproxen (NAPROSYN) 500 MG tablet Take 1 tablet (500 mg total) by mouth 2 (two) times daily. Patient not taking: Reported on 04/02/2017 02/06/17   Orpah Greek, MD  predniSONE (STERAPRED UNI-PAK 21 TAB) 10 MG (21) TBPK tablet Take by mouth daily. Take 6 tabs by mouth daily  for 2 days, then 5 tabs for 2 days, then 4 tabs for 2 days, then 3 tabs for 2 days, 2 tabs for 2 days, then 1 tab by mouth daily for 2 days Patient not taking: Reported on 04/02/2017 02/03/17   Barnet Glasgow, NP  traMADol (ULTRAM) 50 MG tablet Take 1 tablet (50 mg total) by mouth every 6 (six) hours as needed. Patient not taking: Reported on 04/02/2017 02/06/17   Orpah Greek, MD    Current Facility-Administered Medications  Medication Dose Route Frequency Provider Last Rate Last Dose  . 0.9 %  sodium chloride infusion   Intravenous Continuous Nita Sells, MD 100 mL/hr at 04/05/17 0509    . acetaminophen (TYLENOL) tablet 650 mg  650 mg Oral Q6H PRN Samella Parr, NP       Or  . acetaminophen (TYLENOL) suppository 650 mg  650 mg Rectal Q6H PRN Samella Parr, NP      . albuterol (PROVENTIL) (2.5 MG/3ML) 0.083% nebulizer solution 2.5 mg  2.5 mg Nebulization Q6H PRN Fuller Plan A, MD      . diphenhydrAMINE (BENADRYL) injection 25 mg  25 mg Intravenous Once Emeline General, PA-C   Stopped at 04/02/17 0603  . enoxaparin (LOVENOX) injection 40 mg  40 mg Subcutaneous Daily Samella Parr, NP   40 mg at 04/05/17 0819  . metoCLOPramide (REGLAN) injection 10 mg  10 mg Intravenous Once Dossie Der   Stopped at 04/02/17 0602  . morphine 4 MG/ML injection 1-4 mg  1-4 mg Intravenous Q2H PRN Samella Parr, NP   4 mg at 04/05/17 1134  . ondansetron  (ZOFRAN) injection 4 mg  4 mg Intravenous Q6H PRN Samella Parr, NP   4 mg at 04/05/17 0346   Or  . promethazine (PHENERGAN) injection 25 mg  25 mg Intravenous Q6H PRN Samella Parr, NP   25 mg at 04/05/17 0630  . pantoprazole (PROTONIX) EC tablet 40 mg  40 mg Oral BID Nita Sells, MD   40 mg at 04/05/17 0815    Allergies as of 04/01/2017  . (No Known Allergies)    Family History  Problem Relation Age of Onset  . Hyperlipidemia Mother   . Diabetes Maternal Uncle   . Diabetes Paternal Grandmother     Social History   Social History  . Marital status: Married    Spouse name: N/A  . Number of children: N/A  . Years of education: N/A   Occupational History  . Not on file.   Social History Main Topics  . Smoking status: Never Smoker  . Smokeless tobacco: Never Used  . Alcohol use No  . Drug use: No  . Sexual activity: Not on file   Other Topics Concern  . Not on file   Social History Narrative   ** Merged History Encounter **        Review of Systems: Positive for: GI: Described in detail in HPI.    Gen: Denies any fever, chills, rigors, night sweats, anorexia, fatigue, weakness, malaise, involuntary weight loss, and sleep disorder CV: Denies chest pain, angina, palpitations, syncope, orthopnea, PND, peripheral edema, and claudication. Resp: Denies dyspnea, cough, sputum, wheezing, coughing up blood. GU : Denies urinary burning, blood in urine, urinary frequency, urinary hesitancy, nocturnal urination, and urinary incontinence. MS: Denies joint pain or swelling.  Denies muscle weakness, cramps, atrophy.  Derm: Denies rash, itching, oral ulcerations, hives, unhealing ulcers.  Psych: Denies depression, anxiety, memory loss, suicidal ideation, hallucinations,  and confusion. Heme: Denies bruising, bleeding, and enlarged lymph nodes. Neuro:  Denies any headaches, dizziness, paresthesias. Endo:  Denies any problems with DM, thyroid, adrenal  function.  Physical Exam: Vital signs in last 24 hours: Temp:  [98.1 F (36.7 C)-98.9 F (37.2 C)] 98.9 F (37.2 C) (07/20 0516) Pulse Rate:  [71-85] 85 (07/20 0516) Resp:  [18] 18 (07/20 0516) BP: (105-117)/(65-70) 105/66 (07/20 0516) SpO2:  [98 %] 98 % (07/20 0516) Last BM Date: 04/02/17  General:   Alert,  Well-developed, well-nourished, in mild distress due to pain  Head:  Normocephalic and atraumatic. Eyes:  Sclera clear, no icterus.   Conjunctiva pink. Ears:  Normal auditory acuity. Nose:  No deformity, discharge,  or lesions. Mouth:  No deformity or lesions.  Oropharynx pink & moist. Neck:  Supple; no masses or thyromegaly. Lungs:  Clear throughout to auscultation.   No wheezes, crackles, or rhonchi. No acute distress. Heart:  Regular rate and rhythm; no murmurs, clicks, rubs,  or  gallops. Extremities:  Without clubbing or edema. Neurologic:  Alert and  oriented x4;  grossly normal neurologically. Skin:  Intact without significant lesions or rashes. Psych:  Alert and cooperative. Normal mood and affect. Abdomen:  Mild generalized tenderness, voluntary guarding noted. Soft, nontender and nondistended. No masses, hepatosplenomegaly or hernias noted. Normal bowel sounds none to sluggish, and without rebound.         Lab Results:  Recent Labs  04/03/17 0439  WBC 6.5  HGB 11.4*  HCT 34.2*  PLT 242   BMET  Recent Labs  04/03/17 0439  NA 136  K 3.6  CL 108  CO2 22  GLUCOSE 85  BUN <5*  CREATININE 0.63  CALCIUM 8.3*   LFT  Recent Labs  04/03/17 0439  PROT 6.6  ALBUMIN 3.6  AST 31  ALT 34  ALKPHOS 64  BILITOT 0.7   PT/INR No results for input(s): LABPROT, INR in the last 72 hours.  Studies/Results: US Abdomen Complete  Result Date: 04/05/2017 CLINICAL DATA:  Pancreatitis. EXAM: ABDOMEN ULTRASOUND COMPLETE COMPARISON:  CT 04/02/2017 . FINDINGS: Gallbladder: Cholecystectomy. Common bile duct: Diameter: 5 mm Liver: Increased echogenicity  consistent fatty infiltration and/or hepatocellular disease. No focal hepatic abnormality identified. IVC: No abnormality visualized. Pancreas: Visualized portion unremarkable. Spleen: Size and appearance within normal limits. Right Kidney: Length: 10.6 cm. Echogenicity within normal limits. No mass or hydronephrosis visualized. Left Kidney: Length: 9.6 cm. Echogenicity within normal limits. No mass or hydronephrosis visualized. Abdominal aorta: No aneurysm visualized. Other findings: None. IMPRESSION: 1. Increased hepatic echogenicity consistent with fatty infiltration and/or hepatocellular disease. 2. Cholecystectomy.  No evidence of biliary distention . 3. Visualized portions of the pancreas appear normal. No focal pancreatic abnormality identified . Electronically Signed   By: Marcello Moores  Register   On: 04/05/2017 11:06    Impression: Pancreatitis noted on CAT scan(peripancreatic stranding involving uncinate process, no pancreatic abscess), normal lipase Normal WBC, normal LFTs, normal renal function Mild anemia, normocytic  Plan: Abdominal pain and CAT scan consistent with pancreatitis, although lipase levels are normal Etiology unclear, will send triglyceride levels, IgG subclasses, and MRCP Prior CAT scans as well as MRI from 2015 reviewed-at that point pancreas was unremarkable Recommend increasing IV fluids to 150 mL an hour(Ringer's lactate preferred), keep nothing by mouth for now as patient continues to have nausea and vomiting, continue pain management BISAP score 0 out of 5(consistent with mild pancreatitis)   LOS: 2 days   Gari Crown 04/05/2017, 11:51 AM  Pager [phone] If no answer or after 5 PM call [phone]

## 2017-04-06 LAB — IGG: IGG (IMMUNOGLOBIN G), SERUM: 890 mg/dL (ref 700–1600)

## 2017-04-06 LAB — IGG 4: IGG 4: 44 mg/dL (ref 2–96)

## 2017-04-06 MED ORDER — HYDROCODONE-ACETAMINOPHEN 5-325 MG PO TABS
1.0000 | ORAL_TABLET | ORAL | Status: DC
  Administered 2017-04-06 – 2017-04-09 (×13): 1 via ORAL
  Filled 2017-04-06 (×14): qty 1

## 2017-04-06 NOTE — Progress Notes (Signed)
Subjective: The patient was seen and examined at bedside. She states the abdominal pain is not controlled. She continues to have nausea, and one episode of vomiting today morning. She hasn't had a bowel movement since Monday. She does not want to have clears, due to ongoing nausea.  Objective: Vital signs in last 24 hours: Temp:  [98.4 F (36.9 C)-98.7 F (37.1 C)] 98.7 F (37.1 C) (07/21 0500) Pulse Rate:  [76-100] 96 (07/21 0500) Resp:  [16-18] 18 (07/21 0500) BP: (101-128)/(58-83) 119/76 (07/21 0500) SpO2:  [97 %-98 %] 98 % (07/21 0500) Weight change:  Last BM Date: 04/02/17  PE: Appears in distress due to pain GENERAL: Mild pallor, no icterus ABDOMEN: Soft, distended, diffuse tenderness noted more on the right side than on the left, bowel sounds sluggish but present EXTREMITIES: No edema, no deformity  Lab Results: Results for orders placed or performed during the hospital encounter of 04/02/17 (from the past 48 hour(s))  IgG for subclass interpretation     Status: None   Collection Time: 04/05/17 12:01 PM  Result Value Ref Range   IgG (Immunoglobin G), Serum 890 700 - 1,600 mg/dL    Comment: (NOTE) Performed At: [facility] [place], [place] [id number] Lindon Romp MD [id number]   IgG 4     Status: None   Collection Time: 04/05/17 12:01 PM  Result Value Ref Range   IgG, Subclass 4 44 2 - 96 mg/dL    Comment: (NOTE) Performed At: [facility] [place], [place] [id number] Lindon Romp MD [id number]   Triglycerides     Status: Abnormal   Collection Time: 04/05/17 12:06 PM  Result Value Ref Range   Triglycerides 152 (H) <150 mg/dL    Studies/Results: US Abdomen Complete  Result Date: 04/05/2017 CLINICAL DATA:  Pancreatitis. EXAM: ABDOMEN ULTRASOUND COMPLETE COMPARISON:  CT 04/02/2017 . FINDINGS: Gallbladder: Cholecystectomy. Common bile duct: Diameter: 5 mm Liver: Increased echogenicity consistent fatty  infiltration and/or hepatocellular disease. No focal hepatic abnormality identified. IVC: No abnormality visualized. Pancreas: Visualized portion unremarkable. Spleen: Size and appearance within normal limits. Right Kidney: Length: 10.6 cm. Echogenicity within normal limits. No mass or hydronephrosis visualized. Left Kidney: Length: 9.6 cm. Echogenicity within normal limits. No mass or hydronephrosis visualized. Abdominal aorta: No aneurysm visualized. Other findings: None. IMPRESSION: 1. Increased hepatic echogenicity consistent with fatty infiltration and/or hepatocellular disease. 2. Cholecystectomy.  No evidence of biliary distention . 3. Visualized portions of the pancreas appear normal. No focal pancreatic abnormality identified . Electronically Signed   By: Marcello Moores  Register   On: 04/05/2017 11:06   Mr 3d Recon At Scanner  Result Date: 04/06/2017 CLINICAL DATA:  Upper abdominal pain with nausea and vomiting. Weight loss. Acute pancreatitis. EXAM: MRI ABDOMEN WITHOUT AND WITH CONTRAST (INCLUDING MRCP) TECHNIQUE: Multiplanar multisequence MR imaging of the abdomen was performed both before and after the administration of intravenous contrast. Heavily T2-weighted images of the biliary and pancreatic ducts were obtained, and three-dimensional MRCP images were rendered by post processing. CONTRAST:  16mL MULTIHANCE GADOBENATE DIMEGLUMINE 529 MG/ML IV SOLN COMPARISON:  Multiple exams, including 04/02/2017 CT scan FINDINGS: Lower chest: Mild cardiomegaly. Mild atelectasis in both lower lobes. Geographic hepatic steatosis. Hepatobiliary: Unremarkable Pancreas: No pancreatic or peripancreatic edema. The dorsal pancreatic duct normal caliber. No pancreas divisum. No differential enhancement in the pancreatic parenchyma. No peripancreatic fluid collection. Uncinate process normal. Spleen:  Unremarkable Adrenals/Urinary Tract:  Unremarkable Stomach/Bowel: Unremarkable Vascular/Lymphatic:  Unremarkable Other:   Suspected 2.6 cm  right ovarian cyst, image 68/17. Musculoskeletal: Unremarkable IMPRESSION: 1. No current pancreatic abnormality is identified. There is no evidence of pancreatic divisum. 2. Mild cardiomegaly. 3. Geographic hepatic steatosis. 4. Suspected 2.6 cm right ovarian cyst, included only on the coronal images. No follow up of this lesion is necessary. Electronically Signed   By: Van Clines M.D.   On: 04/06/2017 08:59   Mr Abdomen Mrcp Moise Boring Contast  Result Date: 04/06/2017 CLINICAL DATA:  Upper abdominal pain with nausea and vomiting. Weight loss. Acute pancreatitis. EXAM: MRI ABDOMEN WITHOUT AND WITH CONTRAST (INCLUDING MRCP) TECHNIQUE: Multiplanar multisequence MR imaging of the abdomen was performed both before and after the administration of intravenous contrast. Heavily T2-weighted images of the biliary and pancreatic ducts were obtained, and three-dimensional MRCP images were rendered by post processing. CONTRAST:  40mL MULTIHANCE GADOBENATE DIMEGLUMINE 529 MG/ML IV SOLN COMPARISON:  Multiple exams, including 04/02/2017 CT scan FINDINGS: Lower chest: Mild cardiomegaly. Mild atelectasis in both lower lobes. Geographic hepatic steatosis. Hepatobiliary: Unremarkable Pancreas: No pancreatic or peripancreatic edema. The dorsal pancreatic duct normal caliber. No pancreas divisum. No differential enhancement in the pancreatic parenchyma. No peripancreatic fluid collection. Uncinate process normal. Spleen:  Unremarkable Adrenals/Urinary Tract:  Unremarkable Stomach/Bowel: Unremarkable Vascular/Lymphatic:  Unremarkable Other:  Suspected 2.6 cm right ovarian cyst, image 68/17. Musculoskeletal: Unremarkable IMPRESSION: 1. No current pancreatic abnormality is identified. There is no evidence of pancreatic divisum. 2. Mild cardiomegaly. 3. Geographic hepatic steatosis. 4. Suspected 2.6 cm right ovarian cyst, included only on the coronal images. No follow up of this lesion is necessary. Electronically  Signed   By: Van Clines M.D.   On: 04/06/2017 08:59    Medications: I have reviewed the patient's current medications.  Assessment: 1. Abdominal pain, unclear etiology No evidence of pancreatitis on MRI MRCP unremarkable, no evidence of pancreatic divisum Hepatobiliary tree appears unremarkable. IgG subclasses and total IgG within normal limits. Triglycerides within normal limits  2. Nausea, vomiting Continuous nausea, despite use of Phenergan 25 mg IV every 6 hours when necessary, and Zofran 4 mg IV every 6 hours when necessary, along with Reglan as needed   Plan: 1. Unclear source of abdominal pain, nausea, vomiting 2. Pancreatitis unlikely, given normal pancreas on MRI, persistently normal lipase level 3. No evidence of dehydration, normal LFTs 4. Sigmoid diverticulosis without diverticulitis noted on CAT scan from 04/02/17, no bowel movements for almost a week, this may be related to nothing by mouth status, as there are no signs of obstruction noted on imaging, and clinically abdomen appears benign.  We will look for other uncommon etiologies for abdominal pain, such as porphyria(Will send plasma porphyrin level). No bowel wall edema noted on imaging. Malingering needs to be considered, given completely normal labs and imaging. We will consider EGD, if no improvement noted with abdominal pain, nausea and vomiting.           Ronnette Juniper 04/06/2017, 12:57 PM   Pager [phone] If no answer or after 5 PM call [phone]

## 2017-04-06 NOTE — Progress Notes (Signed)
PROGRESS NOTE    Lorraine [name]  NWG:[id number] DOB: 09-12-1986 DOA: 04/02/2017 PCP: Patient, No Pcp Per  Outpatient Specialists:     Brief Narrative:  31 year old Spanish-speaking [interviewed with the interpreter] Gravida 3 para 3--has had tubal ligation bilaterally Morbid obesity Reflux Multiple prior episodes of pyelonephritis with admission and 2016  Also has been admitted in past for diverticulitis 2013 Prior cholecystectomy about 10 years prior Admitted with pain in abdomen radiating to back-worsens with moving around Found to have acute pancreatitis on admission   Slow to progress  Assessment & Plan:   Principal Problem:   Intractable nausea and vomiting Active Problems:   Acute pancreatitis   Abdominal pain, acute, left upper quadrant   Asthma in adult   Obesity (BMI 30.0-34.9)   Fatty infiltration of liver   Gastroesophageal reflux disease without esophagitis   Cryptogenic pancreatitis   Consulted gastroenterology given persistence of issues with negative lipase\  ? Other issues-pscyhosomatic  IgG is neg so unlikely autoimmun pancreatitis , MRCP shows a  R ovarian cyst only-pancreeas  is wnl on MRCP  triglycerides are only 152 so this is not the possible etiology  GI recommending increasing IV fluid to 1 50 cc per hour and follow  Feels abd empty-willing to try clears  ? Needs EUS As per GI  We will start MiraLAX  Fatty liver  Never drinker, has had however cholecystectomy in the distant past  not sure but could have had CBD stone which has passed   Obesity Stable at the present time  Hypermenorrhea  Tells me for the past 3 months she's been having 2 periods a month  recommend outpatient GYN follow-up   If persisting n/v might consider Adenomyosis and endometriosis but unlikely cause  Inpatient Lovenox Unclear disposition     Subjective:  Interviewed with interpreter No improvement Vomit x 1 this am still  feels poorly and does not  seem to be getting out of bed Thinks that she can have clears however Pain is not greater than 5/10 but is receiving 4 of morphine almost regularly She is willing to try Vicodin States that the pain is in the upper quadrant with radiation to the right side She has not had a stool in the past week  Objective: Vitals:   04/05/17 0516 04/05/17 1316 04/05/17 2327 04/06/17 0500  BP: 105/66 (!) 101/58 128/83 119/76  Pulse: 85 76 100 96  Resp: 18 18 16 18   Temp: 98.9 F (37.2 C) 98.4 F (36.9 C) 98.7 F (37.1 C) 98.7 F (37.1 C)  TempSrc: Oral Oral Oral Oral  SpO2: 98% 97% 98% 98%  Weight:      Height:        Intake/Output Summary (Last 24 hours) at 04/06/17 1312 Last data filed at 04/06/17 0524  Gross per 24 hour  Intake             1200 ml  Output                0 ml  Net             1200 ml   Filed Weights   04/02/17 0812  Weight: 82.8 kg (182 lb 8.7 oz)    Examination:  Long discussion with family External ocular movement intact no pallor no exudate is Flat affect however when I go back into the room subsequently she is much more awake and alert Chest is clear S1-S2 no murmur rub or gallop No lower extremity edema or  swelling   Data Reviewed: I have personally reviewed following labs and imaging studies  CBC:  Recent Labs Lab 04/01/17 2209 04/03/17 0439  WBC 9.3 6.5  HGB 12.4 11.4*  HCT 37.9 34.2*  MCV 90.5 90.2  PLT 273 902   Basic Metabolic Panel:  Recent Labs Lab 04/01/17 2209 04/02/17 0825 04/03/17 0439  NA 137  --  136  K 3.8  --  3.6  CL 104  --  108  CO2 25  --  22  GLUCOSE 102*  --  85  BUN 10  --  <5*  CREATININE 0.94  --  0.63  CALCIUM 8.9  --  8.3*  MG  --  1.8  --   PHOS  --  2.1* 2.4*   GFR: Estimated Creatinine Clearance: 98 mL/min (by C-G formula based on SCr of 0.63 mg/dL). Liver Function Tests:  Recent Labs Lab 04/01/17 2209 04/03/17 0439  AST 37 31  ALT 38 34  ALKPHOS 80 64  BILITOT 0.4 0.7  PROT 6.9 6.6    ALBUMIN 4.0 3.6    Recent Labs Lab 04/01/17 2209 04/03/17 0439  LIPASE 31 20   No results for input(s): AMMONIA in the last 168 hours. Coagulation Profile: No results for input(s): INR, PROTIME in the last 168 hours. Cardiac Enzymes: No results for input(s): CKTOTAL, CKMB, CKMBINDEX, TROPONINI in the last 168 hours. BNP (last 3 results) No results for input(s): PROBNP in the last 8760 hours. HbA1C: No results for input(s): HGBA1C in the last 72 hours. CBG: No results for input(s): GLUCAP in the last 168 hours. Lipid Profile:  Recent Labs  04/05/17 1206  TRIG 152*   Thyroid Function Tests: No results for input(s): TSH, T4TOTAL, FREET4, T3FREE, THYROIDAB in the last 72 hours. Anemia Panel: No results for input(s): VITAMINB12, FOLATE, FERRITIN, TIBC, IRON, RETICCTPCT in the last 72 hours. Urine analysis:    Component Value Date/Time   COLORURINE STRAW (A) 04/03/2017 1130   APPEARANCEUR CLEAR 04/03/2017 1130   LABSPEC 1.006 04/03/2017 1130   PHURINE 6.0 04/03/2017 1130   GLUCOSEU NEGATIVE 04/03/2017 1130   HGBUR NEGATIVE 04/03/2017 1130   BILIRUBINUR NEGATIVE 04/03/2017 1130   BILIRUBINUR neg 07/15/2015 1026   KETONESUR 20 (A) 04/03/2017 1130   PROTEINUR NEGATIVE 04/03/2017 1130   UROBILINOGEN 0.2 08/28/2016 1200   NITRITE NEGATIVE 04/03/2017 1130   LEUKOCYTESUR NEGATIVE 04/03/2017 1130   Sepsis Labs: @LABRCNTIP (procalcitonin:4,lacticidven:4)  )No results found for this or any previous visit (from the past 240 hour(s)).       Radiology Studies: US Abdomen Complete  Result Date: 04/05/2017 CLINICAL DATA:  Pancreatitis. EXAM: ABDOMEN ULTRASOUND COMPLETE COMPARISON:  CT 04/02/2017 . FINDINGS: Gallbladder: Cholecystectomy. Common bile duct: Diameter: 5 mm Liver: Increased echogenicity consistent fatty infiltration and/or hepatocellular disease. No focal hepatic abnormality identified. IVC: No abnormality visualized. Pancreas: Visualized portion unremarkable.  Spleen: Size and appearance within normal limits. Right Kidney: Length: 10.6 cm. Echogenicity within normal limits. No mass or hydronephrosis visualized. Left Kidney: Length: 9.6 cm. Echogenicity within normal limits. No mass or hydronephrosis visualized. Abdominal aorta: No aneurysm visualized. Other findings: None. IMPRESSION: 1. Increased hepatic echogenicity consistent with fatty infiltration and/or hepatocellular disease. 2. Cholecystectomy.  No evidence of biliary distention . 3. Visualized portions of the pancreas appear normal. No focal pancreatic abnormality identified . Electronically Signed   By: Marcello Moores  Register   On: 04/05/2017 11:06   Mr 3d Recon At Scanner  Result Date: 04/06/2017 CLINICAL DATA:  Upper abdominal pain with nausea  and vomiting. Weight loss. Acute pancreatitis. EXAM: MRI ABDOMEN WITHOUT AND WITH CONTRAST (INCLUDING MRCP) TECHNIQUE: Multiplanar multisequence MR imaging of the abdomen was performed both before and after the administration of intravenous contrast. Heavily T2-weighted images of the biliary and pancreatic ducts were obtained, and three-dimensional MRCP images were rendered by post processing. CONTRAST:  79mL MULTIHANCE GADOBENATE DIMEGLUMINE 529 MG/ML IV SOLN COMPARISON:  Multiple exams, including 04/02/2017 CT scan FINDINGS: Lower chest: Mild cardiomegaly. Mild atelectasis in both lower lobes. Geographic hepatic steatosis. Hepatobiliary: Unremarkable Pancreas: No pancreatic or peripancreatic edema. The dorsal pancreatic duct normal caliber. No pancreas divisum. No differential enhancement in the pancreatic parenchyma. No peripancreatic fluid collection. Uncinate process normal. Spleen:  Unremarkable Adrenals/Urinary Tract:  Unremarkable Stomach/Bowel: Unremarkable Vascular/Lymphatic:  Unremarkable Other:  Suspected 2.6 cm right ovarian cyst, image 68/17. Musculoskeletal: Unremarkable IMPRESSION: 1. No current pancreatic abnormality is identified. There is no evidence of  pancreatic divisum. 2. Mild cardiomegaly. 3. Geographic hepatic steatosis. 4. Suspected 2.6 cm right ovarian cyst, included only on the coronal images. No follow up of this lesion is necessary. Electronically Signed   By: Van Clines M.D.   On: 04/06/2017 08:59   Mr Abdomen Mrcp Moise Boring Contast  Result Date: 04/06/2017 CLINICAL DATA:  Upper abdominal pain with nausea and vomiting. Weight loss. Acute pancreatitis. EXAM: MRI ABDOMEN WITHOUT AND WITH CONTRAST (INCLUDING MRCP) TECHNIQUE: Multiplanar multisequence MR imaging of the abdomen was performed both before and after the administration of intravenous contrast. Heavily T2-weighted images of the biliary and pancreatic ducts were obtained, and three-dimensional MRCP images were rendered by post processing. CONTRAST:  67mL MULTIHANCE GADOBENATE DIMEGLUMINE 529 MG/ML IV SOLN COMPARISON:  Multiple exams, including 04/02/2017 CT scan FINDINGS: Lower chest: Mild cardiomegaly. Mild atelectasis in both lower lobes. Geographic hepatic steatosis. Hepatobiliary: Unremarkable Pancreas: No pancreatic or peripancreatic edema. The dorsal pancreatic duct normal caliber. No pancreas divisum. No differential enhancement in the pancreatic parenchyma. No peripancreatic fluid collection. Uncinate process normal. Spleen:  Unremarkable Adrenals/Urinary Tract:  Unremarkable Stomach/Bowel: Unremarkable Vascular/Lymphatic:  Unremarkable Other:  Suspected 2.6 cm right ovarian cyst, image 68/17. Musculoskeletal: Unremarkable IMPRESSION: 1. No current pancreatic abnormality is identified. There is no evidence of pancreatic divisum. 2. Mild cardiomegaly. 3. Geographic hepatic steatosis. 4. Suspected 2.6 cm right ovarian cyst, included only on the coronal images. No follow up of this lesion is necessary. Electronically Signed   By: Van Clines M.D.   On: 04/06/2017 08:59        Scheduled Meds: . diphenhydrAMINE  25 mg Intravenous Once  . enoxaparin (LOVENOX) injection  40  mg Subcutaneous Daily  . metoCLOPramide (REGLAN) injection  10 mg Intravenous Once  . pantoprazole  40 mg Oral BID   Continuous Infusions: . lactated ringers 150 mL/hr at 04/06/17 1008     LOS: 3 days    Time spent: [place], MD [facility]   If 7PM-7AM, please contact night-coverage [email or web address] Password [email or web address] 04/06/2017, 1:12 PM

## 2017-04-07 ENCOUNTER — Encounter (HOSPITAL_COMMUNITY): Payer: Self-pay | Admitting: Certified Registered Nurse Anesthetist

## 2017-04-07 ENCOUNTER — Inpatient Hospital Stay (HOSPITAL_COMMUNITY): Payer: Medicaid Other | Admitting: Certified Registered Nurse Anesthetist

## 2017-04-07 ENCOUNTER — Encounter (HOSPITAL_COMMUNITY)
Admission: EM | Disposition: A | Discharge status: Home / Self Care | Payer: Self-pay | Source: Home / Self Care | Attending: Family Medicine

## 2017-04-07 HISTORY — PX: ESOPHAGOGASTRODUODENOSCOPY (EGD) WITH PROPOFOL: SHX5813

## 2017-04-07 LAB — COMPREHENSIVE METABOLIC PANEL
ALBUMIN: 3.5 g/dL (ref 3.5–5.0)
ALK PHOS: 69 U/L (ref 38–126)
ALT: 35 U/L (ref 14–54)
ANION GAP: 11 (ref 5–15)
AST: 37 U/L (ref 15–41)
BILIRUBIN TOTAL: UNDETERMINED mg/dL (ref 0.3–1.2)
BUN: 8 mg/dL (ref 6–20)
CALCIUM: 10.1 mg/dL (ref 8.9–10.3)
CO2: 20 mmol/L — ABNORMAL LOW (ref 22–32)
CREATININE: 0.79 mg/dL (ref 0.44–1.00)
Chloride: 108 mmol/L (ref 101–111)
GFR calc Af Amer: >60 mL/min (ref >60)
GFR calc non Af Amer: >60 mL/min (ref >60)
GLUCOSE: 79 mg/dL (ref 65–99)
Potassium: 5.5 mmol/L — ABNORMAL HIGH (ref 3.5–5.1)
Sodium: 139 mmol/L (ref 135–145)
TOTAL PROTEIN: 6.4 g/dL — AB (ref 6.5–8.1)

## 2017-04-07 SURGERY — ESOPHAGOGASTRODUODENOSCOPY (EGD) WITH PROPOFOL
Anesthesia: Monitor Anesthesia Care | Laterality: Left

## 2017-04-07 MED ORDER — LACTATED RINGERS IV SOLN
INTRAVENOUS | Status: DC | PRN
  Administered 2017-04-07: 08:00:00 via INTRAVENOUS

## 2017-04-07 MED ORDER — CHLORPROMAZINE HCL 25 MG/ML IJ SOLN
12.5000 mg | Freq: Three times a day (TID) | INTRAMUSCULAR | Status: DC
  Filled 2017-04-07: qty 0.5

## 2017-04-07 MED ORDER — METOCLOPRAMIDE HCL 10 MG PO TABS: 10.0000 mg | ORAL_TABLET | Freq: Three times a day (TID) | ORAL | Status: DC

## 2017-04-07 MED ORDER — LIDOCAINE HCL (CARDIAC) 20 MG/ML IV SOLN
INTRAVENOUS | Status: DC | PRN
  Administered 2017-04-07: 30 mg via INTRAVENOUS

## 2017-04-07 MED ORDER — PROPOFOL 500 MG/50ML IV EMUL
INTRAVENOUS | Status: DC | PRN
  Administered 2017-04-07: 75 ug/kg/min via INTRAVENOUS

## 2017-04-07 MED ORDER — SODIUM CHLORIDE 0.9 % IV SOLN: INTRAVENOUS | Status: DC

## 2017-04-07 MED ORDER — SODIUM CHLORIDE 0.9 % IV SOLN
12.5000 mg | Freq: Once | INTRAVENOUS | Status: AC
  Administered 2017-04-07: 12.5 mg via INTRAVENOUS
  Filled 2017-04-07: qty 0.5

## 2017-04-07 NOTE — Interval H&P Note (Signed)
History and Physical Interval Note: 31 year old female, with persistent nausea, intermittent vomiting, epigastric and right-sided abdominal pain, is scheduled for a diagnostic EGD and biopsies.  04/07/2017 7:58 AM  Lorraine [name]  has presented today for EGD, with the diagnosis of abdominal pain, nausea, vomiting  The various methods of treatment have been discussed with the patient and family. After consideration of risks, benefits and other options for treatment, the patient has consented to  Procedure(s): ESOPHAGOGASTRODUODENOSCOPY (EGD) WITH PROPOFOL (Left) as a surgical intervention .  The patient's history has been reviewed, patient examined, no change in status, stable for surgery.  I have reviewed the patient's chart and labs.  Questions were answered to the patient's satisfaction.     Ronnette Juniper

## 2017-04-07 NOTE — Anesthesia Postprocedure Evaluation (Signed)
Anesthesia Post Note  Patient: Lorraine [name]  Procedure(s) Performed: Procedure(s) (LRB): ESOPHAGOGASTRODUODENOSCOPY (EGD) WITH PROPOFOL (Left)     Patient location during evaluation: PACU Anesthesia Type: MAC Level of consciousness: awake Pain management: pain level controlled Vital Signs Assessment: post-procedure vital signs reviewed and stable Respiratory status: spontaneous breathing Cardiovascular status: stable Anesthetic complications: no    Last Vitals:  Vitals:   04/07/17 0824 04/07/17 0903  BP: 126/70 127/87  Pulse:  71  Resp: 19 18  Temp: 36.9 C 36.7 C    Last Pain:  Vitals:   04/07/17 0903  TempSrc: Oral  PainSc:                  Darrius Montano

## 2017-04-07 NOTE — Op Note (Signed)
[facility] Patient Name: Lorraine [name] Procedure Date : 04/07/2017 MRN: [id number] Attending MD: Ronnette Juniper , MD Date of Birth: August 21, 1986 CSN: [id number] Age: 31 Admit Type: Inpatient Procedure:                Upper GI endoscopy Indications:              Epigastric abdominal pain, Abdominal pain in the                            right upper quadrant, Upper abdominal pain, Nausea                            with vomiting Providers:                Ronnette Juniper, MD, Kingsley Plan, RN, Elspeth Cho                            Tech., Technician, Gala Lewandowsky, CRNA Referring MD:              Medicines:                Monitored Anesthesia Care Complications:            No immediate complications. Estimated Blood Loss:     Estimated blood loss: none. Procedure:                Pre-Anesthesia Assessment:                           - Prior to the procedure, a History and Physical                            was performed, and patient medications and                            allergies were reviewed. The patient's tolerance of                            previous anesthesia was also reviewed. The risks                            and benefits of the procedure and the sedation                            options and risks were discussed with the patient.                            All questions were answered, and informed consent                            was obtained. Prior Anticoagulants: The patient has                            taken no previous anticoagulant or antiplatelet  agents. ASA Grade Assessment: II - A patient with                            mild systemic disease. After reviewing the risks                            and benefits, the patient was deemed in                            satisfactory condition to undergo the procedure.                           After obtaining informed consent, the endoscope was   passed under direct vision. Throughout the                            procedure, the patient's blood pressure, pulse, and                            oxygen saturations were monitored continuously. The                            was introduced through the mouth, and advanced to                            the second part of duodenum. The upper GI endoscopy                            was accomplished without difficulty. The patient                            tolerated the procedure well. Scope In: Scope Out: Findings:      The examined esophagus was normal.      The Z-line was regular and was found 35 cm from the incisors.      Diffuse moderately erythematous mucosa without bleeding was found in the       stomach.      Localized moderately erythematous mucosa without bleeding was found in       the gastric antrum. Biopsies were taken with a cold forceps for       Helicobacter pylori testing.      The cardia and gastric fundus were normal on retroflexion.      Localized mild mucosal changes characterized by scalloping and altered       texture were found in the duodenal bulb.      The first portion of the duodenum and second portion of the duodenum       were normal. Biopsies for histology were taken with a cold forceps for       evaluation of celiac disease.      Bilious fluid was found in the gastric body(129ml). Impression:               - Normal esophagus.                           - Z-line regular, 35 cm from the incisors.                           -  Erythematous mucosa in the stomach.                           - Erythematous mucosa in the antrum. Biopsied.                           - Mucosal changes in the duodenum.                           - Normal first portion of the duodenum and second                            portion of the duodenum. Biopsied.                           - Bilious gastric fluid(100 ml). Moderate Sedation:      Patient did not receive moderate sedation for  this procedure, but       instead received monitored anesthesia care. Recommendation:           - Clear liquid diet.                           - Continue present medications.                           - Await pathology results.                           - Do a gastric emptying study at appointment to be                            scheduled.                           - Return patient to hospital ward for ongoing care. Procedure Code(s):        --- Professional ---                           [phone], Esophagogastroduodenoscopy, flexible,                            transoral; with biopsy, single or multiple Diagnosis Code(s):        --- Professional ---                           K31.89, Other diseases of stomach and duodenum                           R10.13, Epigastric pain                           R10.11, Right upper quadrant pain                           R10.10, Upper abdominal pain, unspecified  R11.2, Nausea with vomiting, unspecified CPT copyright 2016 [employer]. All rights reserved. The codes documented in this report are preliminary and upon coder review may  be revised to meet current compliance requirements. Ronnette Juniper, MD 04/07/2017 8:21:33 AM This report has been signed electronically. Number of Addenda: 0

## 2017-04-07 NOTE — Anesthesia Procedure Notes (Signed)
Procedure Name: MAC Date/Time: 04/07/2017 8:05 AM Performed by: Oletta Lamas Pre-anesthesia Checklist: Patient identified, Emergency Drugs available, Suction available and Patient being monitored Patient Re-evaluated:Patient Re-evaluated prior to induction Oxygen Delivery Method: Nasal cannula Preoxygenation: Pre-oxygenation with 100% oxygen

## 2017-04-07 NOTE — Anesthesia Preprocedure Evaluation (Addendum)
Anesthesia Evaluation    Reviewed: Allergy & Precautions, NPO status   Airway Mallampati: II  TM Distance: >3 FB     Dental   Pulmonary asthma ,    breath sounds clear to auscultation       Cardiovascular negative cardio ROS   Rhythm:Regular Rate:Normal     Neuro/Psych    GI/Hepatic Neg liver ROS, GERD  ,GI history noted. CG   Endo/Other  negative endocrine ROS  Renal/GU      Musculoskeletal   Abdominal   Peds  Hematology   Anesthesia Other Findings   Reproductive/Obstetrics                            Anesthesia Physical Anesthesia Plan  ASA: II  Anesthesia Plan: MAC   Post-op Pain Management:    Induction: Intravenous  PONV Risk Score and Plan: 2 and Ondansetron, Propofol, Midazolam, Dexamethasone and Treatment may vary due to age or medical condition  Airway Management Planned: Simple Face Mask  Additional Equipment:   Intra-op Plan:   Post-operative Plan:   Informed Consent: I have reviewed the patients History and Physical, chart, labs and discussed the procedure including the risks, benefits and alternatives for the proposed anesthesia with the patient or authorized representative who has indicated his/her understanding and acceptance.   Dental advisory given  Plan Discussed with: Anesthesiologist  Anesthesia Plan Comments:         Anesthesia Quick Evaluation

## 2017-04-07 NOTE — Progress Notes (Signed)
PROGRESS NOTE    Lorraine [name]  QAS:[id number] DOB: [birth year] DOA: 04/02/2017 PCP: Patient, No Pcp Per  Outpatient Specialists:     Brief Narrative:  31 year old Spanish-speaking [interviewed with the interpreter] Gravida 3 para 3--has had tubal ligation bilaterally Morbid obesity Reflux Multiple prior episodes of pyelonephritis with admission and 2016  Also has been admitted in past for diverticulitis 2013 Prior cholecystectomy about 10 years prior Admitted with pain in abdomen radiating to back-worsens with moving around Found to have acute pancreatitis on admission   Slow to progress  Assessment & Plan:   Principal Problem:   Intractable nausea and vomiting Active Problems:   Acute pancreatitis   Abdominal pain, acute, left upper quadrant   Asthma in adult   Obesity (BMI 30.0-34.9)   Fatty infiltration of liver   Gastroesophageal reflux disease without esophagitis   Cryptogenic pancreatitis vs Gastroparesis or PUD + H pylori  appreciate gastroenterology   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  See if encourages thirst  Feels abd empty-willing to try clears  We will start MiraLAX  Fatty liver  Never drinker, has had however cholecystectomy in the distant past  not sure but could have had CBD stone which has passed  Hiccup Apr 17, 2023  Given thorazine trial 12.5 tid meals  Careful if adding reglan  Obesity Stable at the present time  Hypermenorrhea  Tells me for the past 3 months she's been having 2 periods a month  recommend outpatient GYN follow-up   If persisting n/v might consider Adenomyosis and endometriosis but unlikely cause  Inpatient Lovenox Unclear disposition D/w parents at bedside this am   Subjective:  Interviewed with interpreter Looks better-having hiccup Still in pain No vomit today No cp No stool Back from  endo   Objective: Vitals:   04/16/2017 0455 04/16/17 0735 Apr 16, 2017 0824 16-Apr-2017 0903  BP: 125/64  126/70 127/87  Pulse: 72   71  Resp: 18 19 19 18   Temp: 98.6 F (37 C) 98.5 F (36.9 C) 98.4 F (36.9 C) 98.1 F (36.7 C)  TempSrc: Oral Oral Oral Oral  SpO2: 97% 96% 95% 98%  Weight:  82.6 kg (182 lb)    Height:  5' (1.524 m)      Intake/Output Summary (Last 24 hours) at 2017/04/16 1152 Last data filed at April 16, 2017 0457  Gross per 24 hour  Intake             1660 ml  Output              500 ml  Net             1160 ml   Filed Weights   04/02/17 0812 04-16-17 0735  Weight: 82.8 kg (182 lb 8.7 oz) 82.6 kg (182 lb)    Examination:  Smiling today Neck soft supple  No palor no ict abd soft , ender in epigastrium Neuro intact msk intact   Data Reviewed: I have personally reviewed following labs and imaging studies  CBC:  Recent Labs Lab 04/01/17 2209 04/03/17 0439  WBC 9.3 6.5  HGB 12.4 11.4*  HCT 37.9 34.2*  MCV 90.5 90.2  PLT 273 798   Basic Metabolic Panel:  Recent Labs Lab 04/01/17 2209 04/02/17 0825 04/03/17 0439 04/16/2017 0518  NA 137  --  136 139  K 3.8  --  3.6 5.5*  CL 104  --  108 108  CO2 25  --  22 20*  GLUCOSE 102*  --  85 79  BUN 10  --  <5* 8  CREATININE 0.94  --  0.63 0.79  CALCIUM 8.9  --  8.3* 10.1  MG  --  1.8  --   --   PHOS  --  2.1* 2.4*  --    GFR: Estimated Creatinine Clearance: 97.9 mL/min (by C-G formula based on SCr of 0.79 mg/dL). Liver Function Tests:  Recent Labs Lab 04/01/17 2209 04/03/17 0439 04/07/17 0518  AST 37 31 37  ALT 38 34 35  ALKPHOS 80 64 69  BILITOT 0.4 0.7 QUANTITY NOT SUFFICIENT, UNABLE TO PERFORM TEST  PROT 6.9 6.6 6.4*  ALBUMIN 4.0 3.6 3.5    Recent Labs Lab 04/01/17 2209 04/03/17 0439  LIPASE 31 20   No results for input(s): AMMONIA in the last 168 hours. Coagulation Profile: No results for input(s): INR, PROTIME in the last 168 hours. Cardiac Enzymes: No results for input(s):  CKTOTAL, CKMB, CKMBINDEX, TROPONINI in the last 168 hours. BNP (last 3 results) No results for input(s): PROBNP in the last 8760 hours. HbA1C: No results for input(s): HGBA1C in the last 72 hours. CBG: No results for input(s): GLUCAP in the last 168 hours. Lipid Profile:  Recent Labs  04/05/17 1206  TRIG 152*   Thyroid Function Tests: No results for input(s): TSH, T4TOTAL, FREET4, T3FREE, THYROIDAB in the last 72 hours. Anemia Panel: No results for input(s): VITAMINB12, FOLATE, FERRITIN, TIBC, IRON, RETICCTPCT in the last 72 hours. Urine analysis:    Component Value Date/Time   COLORURINE STRAW (A) 04/03/2017 1130   APPEARANCEUR CLEAR 04/03/2017 1130   LABSPEC 1.006 04/03/2017 1130   PHURINE 6.0 04/03/2017 1130   GLUCOSEU NEGATIVE 04/03/2017 1130   HGBUR NEGATIVE 04/03/2017 1130   BILIRUBINUR NEGATIVE 04/03/2017 1130   BILIRUBINUR neg 07/15/2015 1026   KETONESUR 20 (A) 04/03/2017 1130   PROTEINUR NEGATIVE 04/03/2017 1130   UROBILINOGEN 0.2 08/28/2016 1200   NITRITE NEGATIVE 04/03/2017 1130   LEUKOCYTESUR NEGATIVE 04/03/2017 1130   Sepsis Labs: @LABRCNTIP (procalcitonin:4,lacticidven:4)  )No results found for this or any previous visit (from the past 240 hour(s)).       Radiology Studies: Mr 3d Recon At Scanner  Result Date: 04/06/2017 CLINICAL DATA:  Upper abdominal pain with nausea and vomiting. Weight loss. Acute pancreatitis. EXAM: MRI ABDOMEN WITHOUT AND WITH CONTRAST (INCLUDING MRCP) TECHNIQUE: Multiplanar multisequence MR imaging of the abdomen was performed both before and after the administration of intravenous contrast. Heavily T2-weighted images of the biliary and pancreatic ducts were obtained, and three-dimensional MRCP images were rendered by post processing. CONTRAST:  78mL MULTIHANCE GADOBENATE DIMEGLUMINE 529 MG/ML IV SOLN COMPARISON:  Multiple exams, including 04/02/2017 CT scan FINDINGS: Lower chest: Mild cardiomegaly. Mild atelectasis in both lower  lobes. Geographic hepatic steatosis. Hepatobiliary: Unremarkable Pancreas: No pancreatic or peripancreatic edema. The dorsal pancreatic duct normal caliber. No pancreas divisum. No differential enhancement in the pancreatic parenchyma. No peripancreatic fluid collection. Uncinate process normal. Spleen:  Unremarkable Adrenals/Urinary Tract:  Unremarkable Stomach/Bowel: Unremarkable Vascular/Lymphatic:  Unremarkable Other:  Suspected 2.6 cm right ovarian cyst, image 68/17. Musculoskeletal: Unremarkable IMPRESSION: 1. No current pancreatic abnormality is identified. There is no evidence of pancreatic divisum. 2. Mild cardiomegaly. 3. Geographic hepatic steatosis. 4. Suspected 2.6 cm right ovarian cyst, included only on the coronal images. No follow up of this lesion is necessary. Electronically Signed   By: Van Clines M.D.   On: 04/06/2017 08:59  Mr Abdomen Mrcp Moise Boring Contast  Result Date: 04/06/2017 CLINICAL DATA:  Upper abdominal pain with nausea and vomiting. Weight loss. Acute pancreatitis. EXAM: MRI ABDOMEN WITHOUT AND WITH CONTRAST (INCLUDING MRCP) TECHNIQUE: Multiplanar multisequence MR imaging of the abdomen was performed both before and after the administration of intravenous contrast. Heavily T2-weighted images of the biliary and pancreatic ducts were obtained, and three-dimensional MRCP images were rendered by post processing. CONTRAST:  68mL MULTIHANCE GADOBENATE DIMEGLUMINE 529 MG/ML IV SOLN COMPARISON:  Multiple exams, including 04/02/2017 CT scan FINDINGS: Lower chest: Mild cardiomegaly. Mild atelectasis in both lower lobes. Geographic hepatic steatosis. Hepatobiliary: Unremarkable Pancreas: No pancreatic or peripancreatic edema. The dorsal pancreatic duct normal caliber. No pancreas divisum. No differential enhancement in the pancreatic parenchyma. No peripancreatic fluid collection. Uncinate process normal. Spleen:  Unremarkable Adrenals/Urinary Tract:  Unremarkable Stomach/Bowel:  Unremarkable Vascular/Lymphatic:  Unremarkable Other:  Suspected 2.6 cm right ovarian cyst, image 68/17. Musculoskeletal: Unremarkable IMPRESSION: 1. No current pancreatic abnormality is identified. There is no evidence of pancreatic divisum. 2. Mild cardiomegaly. 3. Geographic hepatic steatosis. 4. Suspected 2.6 cm right ovarian cyst, included only on the coronal images. No follow up of this lesion is necessary. Electronically Signed   By: Van Clines M.D.   On: 04/06/2017 08:59        Scheduled Meds: . diphenhydrAMINE  25 mg Intravenous Once  . enoxaparin (LOVENOX) injection  40 mg Subcutaneous Daily  . HYDROcodone-acetaminophen  1 tablet Oral Q4H  . pantoprazole  40 mg Oral BID   Continuous Infusions: . chlorproMAZINE (THORAZINE) IV    . lactated ringers 150 mL/hr at 04/07/17 0902     LOS: 4 days    Time spent: [place], MD [facility]   If 7PM-7AM, please contact night-coverage [email or web address] Password [facility] 04/07/2017, 11:52 AM

## 2017-04-07 NOTE — Brief Op Note (Signed)
04/02/2017 - 04/07/2017  8:21 AM  PATIENT:  Lorraine [name]  31 y.o. female  PRE-OPERATIVE DIAGNOSIS:  abdominal pain, nausea, vomiting  POST-OPERATIVE DIAGNOSIS:  gastritis, duodenum and gastric biopsies  PROCEDURE:  Procedure(s): ESOPHAGOGASTRODUODENOSCOPY (EGD) WITH PROPOFOL (Left)  SURGEON:  Surgeon(s) and Role:    Ronnette Juniper, MD - Primary  PHYSICIAN ASSISTANT:   ASSISTANTS: none   ANESTHESIA:   MAC  EBL:  No intake/output data recorded.  BLOOD ADMINISTERED:none  DRAINS: none   LOCAL MEDICATIONS USED:  NONE  SPECIMEN:  Biopsy / Limited Resection  DISPOSITION OF SPECIMEN:  PATHOLOGY  COUNTS:  YES  TOURNIQUET:  * No tourniquets in log *  DICTATION: .Dragon Dictation  PLAN OF CARE: Admit to inpatient   PATIENT DISPOSITION:  PACU - hemodynamically stable.   Delay start of Pharmacological VTE agent (>24hrs) due to surgical blood loss or risk of bleeding: no

## 2017-04-07 NOTE — Op Note (Signed)
EGD showed a normal esophagus, GE junction was located at 35 cm and appeared normal. About 100 mL of bilious fluid was noted in the gastric cavity, which was suctioned out. Diffuse erythema was noted in the gastric body, more prominent in the antrum. Biopsies have been taken for H. Pylori. Mucosa appeared scalloped, texture changes noted in the duodenal bulb. Rest of the duodenum appeared unremarkable. Biopsies were taken from the duodenal bulb as well as first and second portion of the duodenum to rule out celiac disease.  Recommend performing a gastric emptying scan, to rule out gastroparesis. Continue antiemetics as needed.

## 2017-04-07 NOTE — Progress Notes (Signed)
Pre-procedure nursing checklist for esophagogastroduodenoscopy completed using [employer] interpre[employer] [place] # [phone].

## 2017-04-07 NOTE — Transfer of Care (Signed)
Immediate Anesthesia Transfer of Care Note  Patient: Lorraine [name]  Procedure(s) Performed: Procedure(s): ESOPHAGOGASTRODUODENOSCOPY (EGD) WITH PROPOFOL (Left)  Patient Location: Endoscopy Unit  Anesthesia Type:MAC  Level of Consciousness: awake, alert , oriented and patient cooperative  Airway & Oxygen Therapy: Patient Spontanous Breathing  Post-op Assessment: Report given to RN and Post -op Vital signs reviewed and stable  Post vital signs: Reviewed and stable  Last Vitals:  Vitals:   04/07/17 0824 04/07/17 0903  BP: 126/70 127/87  Pulse:  71  Resp: 19 18  Temp: 36.9 C 36.7 C    Last Pain:  Vitals:   04/07/17 0903  TempSrc: Oral  PainSc:       Patients Stated Pain Goal: 5 (54/09/81 1914)  Complications: No apparent anesthesia complications

## 2017-04-07 NOTE — H&P (View-Only) (Signed)
Subjective: The patient was seen and examined at bedside. She states the abdominal pain is not controlled. She continues to have nausea, and one episode of vomiting today morning. She hasn't had a bowel movement since Monday. She does not want to have clears, due to ongoing nausea.  Objective: Vital signs in last 24 hours: Temp:  [98.4 F (36.9 C)-98.7 F (37.1 C)] 98.7 F (37.1 C) (07/21 0500) Pulse Rate:  [76-100] 96 (07/21 0500) Resp:  [16-18] 18 (07/21 0500) BP: (101-128)/(58-83) 119/76 (07/21 0500) SpO2:  [97 %-98 %] 98 % (07/21 0500) Weight change:  Last BM Date: 04/02/17  PE: Appears in distress due to pain GENERAL: Mild pallor, no icterus ABDOMEN: Soft, distended, diffuse tenderness noted more on the right side than on the left, bowel sounds sluggish but present EXTREMITIES: No edema, no deformity  Lab Results: Results for orders placed or performed during the hospital encounter of 04/02/17 (from the past 48 hour(s))  IgG for subclass interpretation     Status: None   Collection Time: 04/05/17 12:01 PM  Result Value Ref Range   IgG (Immunoglobin G), Serum 890 700 - 1,600 mg/dL    Comment: (NOTE) Performed At: [facility] [place], [place] [id number] Lindon Romp MD AJ:[id number]   IgG 4     Status: None   Collection Time: 04/05/17 12:01 PM  Result Value Ref Range   IgG, Subclass 4 44 2 - 96 mg/dL    Comment: (NOTE) Performed At: [facility] [place], [place] [id number] Lindon Romp MD MO:[id number]   Triglycerides     Status: Abnormal   Collection Time: 04/05/17 12:06 PM  Result Value Ref Range   Triglycerides 152 (H) <150 mg/dL    Studies/Results: US Abdomen Complete  Result Date: 04/05/2017 CLINICAL DATA:  Pancreatitis. EXAM: ABDOMEN ULTRASOUND COMPLETE COMPARISON:  CT 04/02/2017 . FINDINGS: Gallbladder: Cholecystectomy. Common bile duct: Diameter: 5 mm Liver: Increased echogenicity consistent fatty  infiltration and/or hepatocellular disease. No focal hepatic abnormality identified. IVC: No abnormality visualized. Pancreas: Visualized portion unremarkable. Spleen: Size and appearance within normal limits. Right Kidney: Length: 10.6 cm. Echogenicity within normal limits. No mass or hydronephrosis visualized. Left Kidney: Length: 9.6 cm. Echogenicity within normal limits. No mass or hydronephrosis visualized. Abdominal aorta: No aneurysm visualized. Other findings: None. IMPRESSION: 1. Increased hepatic echogenicity consistent with fatty infiltration and/or hepatocellular disease. 2. Cholecystectomy.  No evidence of biliary distention . 3. Visualized portions of the pancreas appear normal. No focal pancreatic abnormality identified . Electronically Signed   By: Marcello Moores  Register   On: 04/05/2017 11:06   Mr 3d Recon At Scanner  Result Date: 04/06/2017 CLINICAL DATA:  Upper abdominal pain with nausea and vomiting. Weight loss. Acute pancreatitis. EXAM: MRI ABDOMEN WITHOUT AND WITH CONTRAST (INCLUDING MRCP) TECHNIQUE: Multiplanar multisequence MR imaging of the abdomen was performed both before and after the administration of intravenous contrast. Heavily T2-weighted images of the biliary and pancreatic ducts were obtained, and three-dimensional MRCP images were rendered by post processing. CONTRAST:  52mL MULTIHANCE GADOBENATE DIMEGLUMINE 529 MG/ML IV SOLN COMPARISON:  Multiple exams, including 04/02/2017 CT scan FINDINGS: Lower chest: Mild cardiomegaly. Mild atelectasis in both lower lobes. Geographic hepatic steatosis. Hepatobiliary: Unremarkable Pancreas: No pancreatic or peripancreatic edema. The dorsal pancreatic duct normal caliber. No pancreas divisum. No differential enhancement in the pancreatic parenchyma. No peripancreatic fluid collection. Uncinate process normal. Spleen:  Unremarkable Adrenals/Urinary Tract:  Unremarkable Stomach/Bowel: Unremarkable Vascular/Lymphatic:  Unremarkable Other:   Suspected 2.6 cm  right ovarian cyst, image 68/17. Musculoskeletal: Unremarkable IMPRESSION: 1. No current pancreatic abnormality is identified. There is no evidence of pancreatic divisum. 2. Mild cardiomegaly. 3. Geographic hepatic steatosis. 4. Suspected 2.6 cm right ovarian cyst, included only on the coronal images. No follow up of this lesion is necessary. Electronically Signed   By: Van Clines M.D.   On: 04/06/2017 08:59   Mr Abdomen Mrcp Moise Boring Contast  Result Date: 04/06/2017 CLINICAL DATA:  Upper abdominal pain with nausea and vomiting. Weight loss. Acute pancreatitis. EXAM: MRI ABDOMEN WITHOUT AND WITH CONTRAST (INCLUDING MRCP) TECHNIQUE: Multiplanar multisequence MR imaging of the abdomen was performed both before and after the administration of intravenous contrast. Heavily T2-weighted images of the biliary and pancreatic ducts were obtained, and three-dimensional MRCP images were rendered by post processing. CONTRAST:  43mL MULTIHANCE GADOBENATE DIMEGLUMINE 529 MG/ML IV SOLN COMPARISON:  Multiple exams, including 04/02/2017 CT scan FINDINGS: Lower chest: Mild cardiomegaly. Mild atelectasis in both lower lobes. Geographic hepatic steatosis. Hepatobiliary: Unremarkable Pancreas: No pancreatic or peripancreatic edema. The dorsal pancreatic duct normal caliber. No pancreas divisum. No differential enhancement in the pancreatic parenchyma. No peripancreatic fluid collection. Uncinate process normal. Spleen:  Unremarkable Adrenals/Urinary Tract:  Unremarkable Stomach/Bowel: Unremarkable Vascular/Lymphatic:  Unremarkable Other:  Suspected 2.6 cm right ovarian cyst, image 68/17. Musculoskeletal: Unremarkable IMPRESSION: 1. No current pancreatic abnormality is identified. There is no evidence of pancreatic divisum. 2. Mild cardiomegaly. 3. Geographic hepatic steatosis. 4. Suspected 2.6 cm right ovarian cyst, included only on the coronal images. No follow up of this lesion is necessary. Electronically  Signed   By: Van Clines M.D.   On: 04/06/2017 08:59    Medications: I have reviewed the patient's current medications.  Assessment: 1. Abdominal pain, unclear etiology No evidence of pancreatitis on MRI MRCP unremarkable, no evidence of pancreatic divisum Hepatobiliary tree appears unremarkable. IgG subclasses and total IgG within normal limits. Triglycerides within normal limits  2. Nausea, vomiting Continuous nausea, despite use of Phenergan 25 mg IV every 6 hours when necessary, and Zofran 4 mg IV every 6 hours when necessary, along with Reglan as needed   Plan: 1. Unclear source of abdominal pain, nausea, vomiting 2. Pancreatitis unlikely, given normal pancreas on MRI, persistently normal lipase level 3. No evidence of dehydration, normal LFTs 4. Sigmoid diverticulosis without diverticulitis noted on CAT scan from 04/02/17, no bowel movements for almost a week, this may be related to nothing by mouth status, as there are no signs of obstruction noted on imaging, and clinically abdomen appears benign.  We will look for other uncommon etiologies for abdominal pain, such as porphyria(Will send plasma porphyrin level). No bowel wall edema noted on imaging. Malingering needs to be considered, given completely normal labs and imaging. We will consider EGD, if no improvement noted with abdominal pain, nausea and vomiting.           Ronnette Juniper 04/06/2017, 12:57 PM   Pager [phone] If no answer or after 5 PM call [phone]

## 2017-04-08 ENCOUNTER — Encounter (HOSPITAL_COMMUNITY): Payer: Self-pay | Admitting: Gastroenterology

## 2017-04-08 ENCOUNTER — Inpatient Hospital Stay (HOSPITAL_COMMUNITY): Payer: Medicaid Other

## 2017-04-08 LAB — BASIC METABOLIC PANEL
ANION GAP: 9 (ref 5–15)
BUN: 7 mg/dL (ref 6–20)
CHLORIDE: 104 mmol/L (ref 101–111)
CO2: 23 mmol/L (ref 22–32)
Calcium: 9.1 mg/dL (ref 8.9–10.3)
Creatinine, Ser: 0.76 mg/dL (ref 0.44–1.00)
GFR calc Af Amer: >60 mL/min (ref >60)
GFR calc non Af Amer: >60 mL/min (ref >60)
Glucose, Bld: 77 mg/dL (ref 65–99)
POTASSIUM: 3.6 mmol/L (ref 3.5–5.1)
SODIUM: 136 mmol/L (ref 135–145)

## 2017-04-08 MED ORDER — METOCLOPRAMIDE HCL 5 MG PO TABS
5.0000 mg | ORAL_TABLET | Freq: Three times a day (TID) | ORAL | Status: DC
  Administered 2017-04-08 – 2017-04-09 (×2): 5 mg via ORAL
  Filled 2017-04-08 (×2): qty 1

## 2017-04-08 MED ORDER — TECHNETIUM TC 99M SULFUR COLLOID
2.0000 | Freq: Once | INTRAVENOUS | Status: AC | PRN
  Administered 2017-04-08: 2 via ORAL

## 2017-04-08 NOTE — Progress Notes (Signed)
[facility]astroenterology Progress Note  Lorraine [name] 31 y.o. [birth year]  CC:   Abdominal pain, nausea and vomiting.  Subjective: Patient lying in the recliner. Phone application was used for interpretation. Symptoms are improving but continues to complain of abdominal pain and nausea.  ROS : Negative for chest pain and shortness of breath   Objective: Vital signs in last 24 hours: Vitals:   04/07/17 2341 04/08/17 0415  BP: 109/62 112/67  Pulse: 70 67  Resp: 18 18  Temp: 98.2 F (36.8 C) 98.6 F (37 C)    Physical Exam:  General:  Alert, cooperative, no distress, appears stated age  Head:  Normocephalic, without obvious abnormality, atraumatic  Eyes:  , EOM's intact,   Lungs:   Clear to auscultation bilaterally, respirations unlabored  Heart:  Regular rate and rhythm, S1, S2 normal  Abdomen:   Soft, Generalized discomfort on palpation without any specific area of tenderness, bowel sounds active all four quadrants,  no masses, no peritoneal signs   Extremities: Extremities normal, atraumatic, no  edema  Pulses: 2+ and symmetric    Lab Results:  Recent Labs  04/07/17 0518 04/08/17 0535  NA 139 136  K 5.5* 3.6  CL 108 104  CO2 20* 23  GLUCOSE 79 77  BUN 8 7  CREATININE 0.79 0.76  CALCIUM 10.1 9.1    Recent Labs  04/07/17 0518  AST 37  ALT 35  ALKPHOS 69  BILITOT QUANTITY NOT SUFFICIENT, UNABLE TO PERFORM TEST  PROT 6.4*  ALBUMIN 3.5   No results for input(s): WBC, NEUTROABS, HGB, HCT, MCV, PLT in the last 72 hours. No results for input(s): LABPROT, INR in the last 72 hours.    Assessment/Plan: - Abdominal pain associated with nausea and vomiting. ?? Mild pancreatitis. CT scan on July 17 showed possible mild pancreatitis. Patient with normal lipase. Subsequent MRI-MRCP showed no pancreatitis. - Mild gastritis based on EGD yesterday. Biopsy pending  Recommendations ------------------------- - Follow gastric emptying study.  - Follow biopsy  results. Continue PPI. Advance diet after gastric empty study. - GI will follow   Otis Brace MD, [place] 04/08/2017, 11:49 AM  Pager [phone]  If no answer or after 5 PM call [phone]

## 2017-04-08 NOTE — Progress Notes (Signed)
PROGRESS NOTE    Lorraine [name]  NOM:[id number] DOB: August 21, 1986 DOA: 04/02/2017 PCP: Patient, No Pcp Per  Outpatient Specialists:     Brief Narrative:  31 year old Spanish-speaking [interviewed with the interpreter] Gravida 3 para 3--has had tubal ligation bilaterally Morbid obesity Reflux Multiple prior episodes of pyelonephritis with admission and 2016  Also has been admitted in past for diverticulitis 2013 Prior cholecystectomy about 10 years prior Admitted with pain in abdomen radiating to back-worsens with moving around Found to have acute pancreatitis on admission   Slow to progress  Assessment & Plan:   Principal Problem:   Intractable nausea and vomiting Active Problems:   Acute pancreatitis   Abdominal pain, acute, left upper quadrant   Asthma in adult   Obesity (BMI 30.0-34.9)   Fatty infiltration of liver   Gastroesophageal reflux disease without esophagitis   Cryptogenic pancreatitis vs Gastroparesis or PUD + H pylori  appreciate gastroenterology   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  See if encourages thirst  Gastric emptying study is negative however GI feels that it is reasonable to use Reglan-started 5 mg 3 times a day   Start soft diet, minimize narcotics and observe  Up out of bed  May shower  Fatty liver  Never drinker, has had however cholecystectomy in the distant past  not sure but could have had CBD stone which has passed  Hiccup 04/26/23  Given thorazine trial 12.5 tid meals  Discontinue Thorazine 7/23  Obesity Stable at the present time  Hypermenorrhea  Tells me for the past 3 months she's been having 2 periods a month  recommend outpatient GYN follow-up   If persisting n/v might consider Adenomyosis and endometriosis but unlikely cause  Inpatient Lovenox Unclear disposition D/w parents at bedside this  am   Subjective:  Interviewed with interpreter Countenance much improved, patient seems pleasant States abdominal pain 5/10 epigastric mainly Passing gas No stool No vomit but has not been able to really eat   Objective: Vitals:   April 25, 2017 1411 Apr 25, 2017 1945 2017/04/25 2341 04/08/17 0415  BP: 119/78 106/66 109/62 112/67  Pulse: 72 73 70 67  Resp: 18 18 18 18   Temp: 97.9 F (36.6 C) 98.6 F (37 C) 98.2 F (36.8 C) 98.6 F (37 C)  TempSrc: Oral Oral Oral Oral  SpO2: 97% 99% 98% 98%  Weight:      Height:        Intake/Output Summary (Last 24 hours) at 04/08/17 0731 Last data filed at 2017-04-25 2300  Gross per 24 hour  Intake           3677.5 ml  Output                0 ml  Net           3677.5 ml   Filed Weights   04/02/17 0812 2017/04/25 0735  Weight: 82.8 kg (182 lb 8.7 oz) 82.6 kg (182 lb)    Examination:  Smiling today Extraocular movements intact no icterus no pallor Chest clear Abdomen tender in epigastrium slightly but much improved from prior Neurologically intact    Data Reviewed: I have personally reviewed following labs and imaging studies  CBC:  Recent Labs Lab 04/01/17 2209 04/03/17 0439  WBC 9.3 6.5  HGB 12.4 11.4*  HCT 37.9 34.2*  MCV 90.5 90.2  PLT 273 962   Basic Metabolic Panel:  Recent Labs Lab 04/01/17 2209 04/02/17 0825 04/03/17 0439 04/07/17 0518 04/08/17 0535  NA 137  --  136 139 136  K 3.8  --  3.6 5.5* 3.6  CL 104  --  108 108 104  CO2 25  --  22 20* 23  GLUCOSE 102*  --  85 79 77  BUN 10  --  <5* 8 7  CREATININE 0.94  --  0.63 0.79 0.76  CALCIUM 8.9  --  8.3* 10.1 9.1  MG  --  1.8  --   --   --   PHOS  --  2.1* 2.4*  --   --    GFR: Estimated Creatinine Clearance: 97.9 mL/min (by C-G formula based on SCr of 0.76 mg/dL). Liver Function Tests:  Recent Labs Lab 04/01/17 2209 04/03/17 0439 04/07/17 0518  AST 37 31 37  ALT 38 34 35  ALKPHOS 80 64 69  BILITOT 0.4 0.7 QUANTITY NOT SUFFICIENT, UNABLE TO  PERFORM TEST  PROT 6.9 6.6 6.4*  ALBUMIN 4.0 3.6 3.5    Recent Labs Lab 04/01/17 2209 04/03/17 0439  LIPASE 31 20   No results for input(s): AMMONIA in the last 168 hours. Coagulation Profile: No results for input(s): INR, PROTIME in the last 168 hours. Cardiac Enzymes: No results for input(s): CKTOTAL, CKMB, CKMBINDEX, TROPONINI in the last 168 hours. BNP (last 3 results) No results for input(s): PROBNP in the last 8760 hours. HbA1C: No results for input(s): HGBA1C in the last 72 hours. CBG: No results for input(s): GLUCAP in the last 168 hours. Lipid Profile:  Recent Labs  04/05/17 1206  TRIG 152*   Thyroid Function Tests: No results for input(s): TSH, T4TOTAL, FREET4, T3FREE, THYROIDAB in the last 72 hours. Anemia Panel: No results for input(s): VITAMINB12, FOLATE, FERRITIN, TIBC, IRON, RETICCTPCT in the last 72 hours. Urine analysis:    Component Value Date/Time   COLORURINE STRAW (A) 04/03/2017 1130   APPEARANCEUR CLEAR 04/03/2017 1130   LABSPEC 1.006 04/03/2017 1130   PHURINE 6.0 04/03/2017 1130   GLUCOSEU NEGATIVE 04/03/2017 1130   HGBUR NEGATIVE 04/03/2017 1130   BILIRUBINUR NEGATIVE 04/03/2017 1130   BILIRUBINUR neg 07/15/2015 1026   KETONESUR 20 (A) 04/03/2017 1130   PROTEINUR NEGATIVE 04/03/2017 1130   UROBILINOGEN 0.2 08/28/2016 1200   NITRITE NEGATIVE 04/03/2017 1130   LEUKOCYTESUR NEGATIVE 04/03/2017 1130   Sepsis Labs: @LABRCNTIP (procalcitonin:4,lacticidven:4)  )No results found for this or any previous visit (from the past 240 hour(s)).       Radiology Studies: No results found.      Scheduled Meds: . diphenhydrAMINE  25 mg Intravenous Once  . enoxaparin (LOVENOX) injection  40 mg Subcutaneous Daily  . HYDROcodone-acetaminophen  1 tablet Oral Q4H  . pantoprazole  40 mg Oral BID   Continuous Infusions: . lactated ringers 50 mL/hr at 04/07/17 2215     LOS: 5 days    Time spent: [place], MD [facility] (P) [phone]   If 7PM-7AM, please contact night-coverage [email or web address] Password [facility] 04/08/2017, 7:31 AM

## 2017-04-09 LAB — CREATININE, SERUM
CREATININE: 0.84 mg/dL (ref 0.44–1.00)
GFR calc non Af Amer: >60 mL/min (ref >60)

## 2017-04-09 MED ORDER — PANTOPRAZOLE SODIUM 40 MG PO TBEC
40.0000 mg | DELAYED_RELEASE_TABLET | Freq: Two times a day (BID) | ORAL | 0 refills | Status: DC
Start: 2017-04-09 — End: 2017-05-28

## 2017-04-09 MED ORDER — METOCLOPRAMIDE HCL 5 MG PO TABS: 5.0000 mg | ORAL_TABLET | Freq: Three times a day (TID) | ORAL | 0 refills | Status: DC

## 2017-04-09 NOTE — Progress Notes (Signed)
Lorraine [name] to be D/C'd  per MD order. Discussed with the patient and all questions fully answered.  VSS, Skin clean, dry and intact without evidence of skin break down, no evidence of skin tears noted.  IV catheter discontinued intact. Site without signs and symptoms of complications. Dressing and pressure applied.  An After Visit Summary was printed and given to the patient. Patient received prescription.  D/c education completed with patient/family including follow up instructions, medication list, d/c activities limitations if indicated, with other d/c instructions as indicated by MD - patient able to verbalize understanding, all questions fully answered.   Patient instructed to return to ED, call 911, or call MD for any changes in condition.   Patient to be escorted via [place], and D/C home via private auto.

## 2017-04-09 NOTE — Progress Notes (Signed)
[facility] Gastroenterology Progress Note  Lorraine [name] 31 y.o. 02-11-1986  CC:   Abdominal pain, nausea and vomiting.  Subjective:  Phone application was used for interpretation. Her nausea and vomiting is improving. Abdominal pain is also improving.  ROS : Negative for chest pain and shortness of breath   Objective: Vital signs in last 24 hours: Vitals:   04/08/17 2132 04/09/17 0539  BP: 102/67 (!) 101/59  Pulse: 85 70  Resp: 18 18  Temp: 98.6 F (37 C) 98.5 F (36.9 C)    Physical Exam:  General:  Alert, cooperative, no distress, appears stated age  Head:  Normocephalic, without obvious abnormality, atraumatic  Eyes:  , EOM's intact,   Lungs:   Clear to auscultation bilaterally, respirations unlabored  Heart:  Regular rate and rhythm, S1, S2 normal  Abdomen:   Soft, Generalized discomfort on palpation without any specific area of tenderness, bowel sounds active all four quadrants,  no masses, no peritoneal signs   Extremities: Extremities normal, atraumatic, no  edema  Pulses: 2+ and symmetric    Lab Results:  Recent Labs  04/07/17 0518 04/08/17 0535 04/09/17 0610  NA 139 136  --   K 5.5* 3.6  --   CL 108 104  --   CO2 20* 23  --   GLUCOSE 79 77  --   BUN 8 7  --   CREATININE 0.79 0.76 0.84  CALCIUM 10.1 9.1  --     Recent Labs  04/07/17 0518  AST 37  ALT 35  ALKPHOS 69  BILITOT QUANTITY NOT SUFFICIENT, UNABLE TO PERFORM TEST  PROT 6.4*  ALBUMIN 3.5   No results for input(s): WBC, NEUTROABS, HGB, HCT, MCV, PLT in the last 72 hours. No results for input(s): LABPROT, INR in the last 72 hours.    Assessment/Plan: - Abdominal pain associated with nausea and vomiting. ?? Mild pancreatitis. CT scan on July 17 showed possible mild pancreatitis. Patient with normal lipase. Subsequent MRI-MRCP showed no pancreatitis.Negative gastric empty study. - Mild gastritis based on EGD yesterday. Biopsy pending  Recommendations ------------------------- -  Patient's symptoms are improving. - Okay to discharge home on PPI for 8 weeks and low dose Reglan for 2 weeks.  - Follow-up in GI clinic in 3-4 weeks after discharge. - GI will sign off. Call us back if needed     Otis Brace MD, [place] 04/09/2017, 10:32 AM  Pager [phone]  If no answer or after 5 PM call [phone]

## 2017-04-09 NOTE — Discharge Summary (Signed)
Physician Discharge Summary  Lorraine [name] ZHY:[id number] DOB: 07-01-1986 DOA: 04/02/2017  PCP: Patient, No Pcp Per  Admit date: 04/02/2017 Discharge date: 04/09/2017  Time spent: 35 minutes  Recommendations for Outpatient Follow-up:  1. Soft diet as outpatient probably had pancreatitis on admission 2. Reglan limited to 2 weeks use and given prescription 3. Get complete metabolic panel and CBC as an outpatient 4. Consider outpatient gynecological follow-up--has had hypermenorrhea for about 3 months  Discharge Diagnoses:  Principal Problem:   Intractable nausea and vomiting Active Problems:   Acute pancreatitis   Abdominal pain, acute, left upper quadrant   Asthma in adult   Obesity (BMI 30.0-34.9)   Fatty infiltration of liver   Gastroesophageal reflux disease without esophagitis   Discharge Condition: Improved  Diet recommendation: Soft heart healthy  Filed Weights   04/02/17 0812 13-Apr-2017 0735  Weight: 82.8 kg (182 lb 8.7 oz) 82.6 kg (182 lb)    History of present illness:  31 year old Spanish-speaking [interviewed with the interpreter] Gravida 3 para 3--has had tubal ligation bilaterally Morbid obesity Reflux Multiple prior episodes of pyelonephritis with admission and 2016             Also has been admitted in past for diverticulitis 2013 Prior cholecystectomy about 10 years prior Admitted with pain in abdomen radiating to back-worsens with moving around Found to have acute pancreatitis on admission   Hospital Course:  Cryptogenic pancreatitis vs Gastroparesis or PUD + H pylori             appreciate gastroenterology              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  See if encourages thirst             Gastric emptying study is negative however GI feels that it is reasonable to use Reglan-started 5 mg 3 times a day                          Start soft diet, minimize narcotics and observe             tolerating diet on day of discharge 724 so will be discharging with a soft diet and limited amount of Reglan 5 3 times a day for 2 weeks and close outpatient follow-up  Fatty liver             Never drinker, has had however cholecystectomy in the distant past             not sure but could have had CBD stone which has passed  Hiccup 04-14-2023             Given thorazine trial 12.5 tid meals             Discontinue Thorazine 7/23  Obesity Stable at the present time  Hypermenorrhea             Tells me for the past 3 months she's been having 2 periods a month             recommend outpatient GYN follow-up              If persisting n/v might consider Adenomyosis and endometriosis but unlikely cause  Procedures:  EGD  Gastric emptying study 724  MRCP   Consultations:  Gastroenterology  Discharge Exam: Vitals:   04/08/17 2132 04/09/17 0539  BP: 102/67 (!) 101/59  Pulse: 85 70  Resp: 18 18  Temp: 98.6 F (37 C) 98.5 F (36.9 C)    General: Awake alert pleasant Cardiovascular: S1-S2 no murmur rub or gallop Respiratory: Clinically clear no added sound Abdomen tender in the epigastrium but improved since admission  Discharge Instructions   Discharge Instructions    Discharge instructions    Complete by:  As directed    Please take only 2 weeks of Reglan Eat and drink very soft diet not spicy not fatty Follow-up with gastroenterology as an outpatient Get labs as an outpatient as well Best of luck-you have a beautiful family   207/5000 Por favor tome solo 2 semanas de Reglan Coma y beba una dieta muy suave no picante ni grasosa Seguimiento con gastroenterologa como paciente ambulatorio Obtenga laboratorios como paciente ambulatorio tambin La mejor de las suertes, tienes una hermosa familia   Increase activity slowly    Complete by:  As directed      Current Discharge  Medication List    START taking these medications   Details  metoCLOPramide (REGLAN) 5 MG tablet Take 1 tablet (5 mg total) by mouth 3 (three) times daily before meals. Qty: 42 tablet, Refills: 0    pantoprazole (PROTONIX) 40 MG tablet Take 1 tablet (40 mg total) by mouth 2 (two) times daily. Qty: 60 tablet, Refills: 0      CONTINUE these medications which have NOT CHANGED   Details  albuterol (PROVENTIL HFA;VENTOLIN HFA) 108 (90 Base) MCG/ACT inhaler Inhale 1-2 puffs into the lungs every 6 (six) hours as needed for wheezing or shortness of breath.    cyclobenzaprine (FLEXERIL) 10 MG tablet Take 1 tablet (10 mg total) by mouth 3 (three) times daily as needed for muscle spasms. Qty: 20 tablet, Refills: 0    traMADol (ULTRAM) 50 MG tablet Take 1 tablet (50 mg total) by mouth every 6 (six) hours as needed. Qty: 15 tablet, Refills: 0      STOP taking these medications     naproxen (NAPROSYN) 500 MG tablet      predniSONE (STERAPRED UNI-PAK 21 TAB) 10 MG (21) TBPK tablet        No Known Allergies Follow-up Information    Brahmbhatt, Parag, MD. Schedule an appointment as soon as possible for a visit in 1 month(s).   Specialty:  Gastroenterology Contact information: [address] [place] [address] [phone]            The results of significant diagnostics from this hospitalization (including imaging, microbiology, ancillary and laboratory) are listed below for reference.    Significant Diagnostic Studies: Nm Gastric Emptying  Result Date: 04/08/2017 CLINICAL DATA:  Nausea vomiting and abdominal pain EXAM: NUCLEAR MEDICINE GASTRIC EMPTYING SCAN TECHNIQUE: After oral ingestion of radiolabeled meal, sequential abdominal images were obtained for 120 minutes. Residual percentage of activity remaining within the stomach was calculated at 60 and 120 minutes. RADIOPHARMACEUTICALS:  Two mCi Tc-60m sulfur colloid in standardized meal COMPARISON:  MRI 04/05/2017, CT  abdomen 04/02/2017 FINDINGS: Expected location of the stomach in the left upper quadrant. Ingested meal empties the stomach gradually over the course of the study with 20% retention at 60 min and 3% retention at 120 min (normal retention less than 30% at a 120 min). IMPRESSION: Normal gastric emptying study. Electronically Signed  By: Donavan Foil M.D.   On: 04/08/2017 15:11   US Abdomen Complete  Result Date: 04/05/2017 CLINICAL DATA:  Pancreatitis. EXAM: ABDOMEN ULTRASOUND COMPLETE COMPARISON:  CT 04/02/2017 . FINDINGS: Gallbladder: Cholecystectomy. Common bile duct: Diameter: 5 mm Liver: Increased echogenicity consistent fatty infiltration and/or hepatocellular disease. No focal hepatic abnormality identified. IVC: No abnormality visualized. Pancreas: Visualized portion unremarkable. Spleen: Size and appearance within normal limits. Right Kidney: Length: 10.6 cm. Echogenicity within normal limits. No mass or hydronephrosis visualized. Left Kidney: Length: 9.6 cm. Echogenicity within normal limits. No mass or hydronephrosis visualized. Abdominal aorta: No aneurysm visualized. Other findings: None. IMPRESSION: 1. Increased hepatic echogenicity consistent with fatty infiltration and/or hepatocellular disease. 2. Cholecystectomy.  No evidence of biliary distention . 3. Visualized portions of the pancreas appear normal. No focal pancreatic abnormality identified . Electronically Signed   By: Marcello Moores  Register   On: 04/05/2017 11:06   Ct Abdomen Pelvis W Contrast  Result Date: 04/02/2017 CLINICAL DATA:  31 year old female with lower abdominal pain. EXAM: CT ABDOMEN AND PELVIS WITH CONTRAST TECHNIQUE: Multidetector CT imaging of the abdomen and pelvis was performed using the standard protocol following bolus administration of intravenous contrast. CONTRAST:  163mL ISOVUE-300 IOPAMIDOL (ISOVUE-300) INJECTION 61% COMPARISON:  Abdominal CT dated 08/30/2016 FINDINGS: Lower chest: The visualized lung bases are  clear. No intra-abdominal free air or free fluid. Hepatobiliary: There is diffuse fatty infiltration of the liver. No intrahepatic biliary ductal dilatation. Cholecystectomy. Pancreas: There is mild peripancreatic stranding primarily involving the uncinate process of the pancreas. Correlation with pancreatic enzymes recommended. No abscess. Spleen: Choose Adrenals/Urinary Tract: Adrenal glands are unremarkable. Kidneys are normal, without renal calculi, focal lesion, or hydronephrosis. Bladder is unremarkable. Stomach/Bowel: There is sigmoid diverticulosis without active inflammatory changes. No bowel obstruction. Normal appendix. Vascular/Lymphatic: No significant vascular findings are present. No enlarged abdominal or pelvic lymph nodes. Reproductive: The uterus is anteverted and grossly unremarkable. Bilateral tubal ligation clips noted. There is a 2 cm right ovarian dominant follicle. Other: Small fat containing umbilical hernia. Musculoskeletal: No acute or significant osseous findings. IMPRESSION: 1. Acute pancreatitis.  No abscess. 2. Sigmoid diverticulosis. No bowel obstruction or active inflammation. Normal appendix. Electronically Signed   By: Anner Crete M.D.   On: 04/02/2017 03:15   Mr 3d Recon At Scanner  Result Date: 04/06/2017 CLINICAL DATA:  Upper abdominal pain with nausea and vomiting. Weight loss. Acute pancreatitis. EXAM: MRI ABDOMEN WITHOUT AND WITH CONTRAST (INCLUDING MRCP) TECHNIQUE: Multiplanar multisequence MR imaging of the abdomen was performed both before and after the administration of intravenous contrast. Heavily T2-weighted images of the biliary and pancreatic ducts were obtained, and three-dimensional MRCP images were rendered by post processing. CONTRAST:  34mL MULTIHANCE GADOBENATE DIMEGLUMINE 529 MG/ML IV SOLN COMPARISON:  Multiple exams, including 04/02/2017 CT scan FINDINGS: Lower chest: Mild cardiomegaly. Mild atelectasis in both lower lobes. Geographic hepatic  steatosis. Hepatobiliary: Unremarkable Pancreas: No pancreatic or peripancreatic edema. The dorsal pancreatic duct normal caliber. No pancreas divisum. No differential enhancement in the pancreatic parenchyma. No peripancreatic fluid collection. Uncinate process normal. Spleen:  Unremarkable Adrenals/Urinary Tract:  Unremarkable Stomach/Bowel: Unremarkable Vascular/Lymphatic:  Unremarkable Other:  Suspected 2.6 cm right ovarian cyst, image 68/17. Musculoskeletal: Unremarkable IMPRESSION: 1. No current pancreatic abnormality is identified. There is no evidence of pancreatic divisum. 2. Mild cardiomegaly. 3. Geographic hepatic steatosis. 4. Suspected 2.6 cm right ovarian cyst, included only on the coronal images. No follow up of this lesion is necessary. Electronically Signed   By: Cindra Eves.D.  On: 04/06/2017 08:59   Mr Abdomen Mrcp Moise Boring Contast  Result Date: 04/06/2017 CLINICAL DATA:  Upper abdominal pain with nausea and vomiting. Weight loss. Acute pancreatitis. EXAM: MRI ABDOMEN WITHOUT AND WITH CONTRAST (INCLUDING MRCP) TECHNIQUE: Multiplanar multisequence MR imaging of the abdomen was performed both before and after the administration of intravenous contrast. Heavily T2-weighted images of the biliary and pancreatic ducts were obtained, and three-dimensional MRCP images were rendered by post processing. CONTRAST:  60mL MULTIHANCE GADOBENATE DIMEGLUMINE 529 MG/ML IV SOLN COMPARISON:  Multiple exams, including 04/02/2017 CT scan FINDINGS: Lower chest: Mild cardiomegaly. Mild atelectasis in both lower lobes. Geographic hepatic steatosis. Hepatobiliary: Unremarkable Pancreas: No pancreatic or peripancreatic edema. The dorsal pancreatic duct normal caliber. No pancreas divisum. No differential enhancement in the pancreatic parenchyma. No peripancreatic fluid collection. Uncinate process normal. Spleen:  Unremarkable Adrenals/Urinary Tract:  Unremarkable Stomach/Bowel: Unremarkable Vascular/Lymphatic:   Unremarkable Other:  Suspected 2.6 cm right ovarian cyst, image 68/17. Musculoskeletal: Unremarkable IMPRESSION: 1. No current pancreatic abnormality is identified. There is no evidence of pancreatic divisum. 2. Mild cardiomegaly. 3. Geographic hepatic steatosis. 4. Suspected 2.6 cm right ovarian cyst, included only on the coronal images. No follow up of this lesion is necessary. Electronically Signed   By: Van Clines M.D.   On: 04/06/2017 08:59    Microbiology: No results found for this or any previous visit (from the past 240 hour(s)).   Labs: Basic Metabolic Panel:  Recent Labs Lab 04/03/17 0439 04/07/17 0518 04/08/17 0535 04/09/17 0610  NA 136 139 136  --   K 3.6 5.5* 3.6  --   CL 108 108 104  --   CO2 22 20* 23  --   GLUCOSE 85 79 77  --   BUN <5* 8 7  --   CREATININE 0.63 0.79 0.76 0.84  CALCIUM 8.3* 10.1 9.1  --   PHOS 2.4*  --   --   --    Liver Function Tests:  Recent Labs Lab 04/03/17 0439 04/07/17 0518  AST 31 37  ALT 34 35  ALKPHOS 64 69  BILITOT 0.7 QUANTITY NOT SUFFICIENT, UNABLE TO PERFORM TEST  PROT 6.6 6.4*  ALBUMIN 3.6 3.5    Recent Labs Lab 04/03/17 0439  LIPASE 20   No results for input(s): AMMONIA in the last 168 hours. CBC:  Recent Labs Lab 04/03/17 0439  WBC 6.5  HGB 11.4*  HCT 34.2*  MCV 90.2  PLT 242   Cardiac Enzymes: No results for input(s): CKTOTAL, CKMB, CKMBINDEX, TROPONINI in the last 168 hours. BNP: BNP (last 3 results) No results for input(s): BNP in the last 8760 hours.  ProBNP (last 3 results) No results for input(s): PROBNP in the last 8760 hours.  CBG: No results for input(s): GLUCAP in the last 168 hours.     SignedNita Sells MD   [facility] 04/09/2017, 12:13 PM

## 2017-04-09 NOTE — Progress Notes (Signed)
Interpreter Graciela Namihira for patient °

## 2017-04-12 ENCOUNTER — Encounter (HOSPITAL_COMMUNITY): Payer: Self-pay | Admitting: *Deleted

## 2017-04-12 ENCOUNTER — Ambulatory Visit (HOSPITAL_COMMUNITY)
Admission: EM | Admit: 2017-04-12 | Discharge: 2017-04-12 | Disposition: A | Discharge status: Nursing Home (Includes ICF) | Payer: Medicaid Other

## 2017-04-12 DIAGNOSIS — K920 Hematemesis: Secondary | ICD-10-CM

## 2017-04-12 DIAGNOSIS — R1012 Left upper quadrant pain: Secondary | ICD-10-CM

## 2017-04-12 DIAGNOSIS — R1031 Right lower quadrant pain: Secondary | ICD-10-CM | POA: Diagnosis not present

## 2017-04-12 DIAGNOSIS — R1011 Right upper quadrant pain: Secondary | ICD-10-CM

## 2017-04-12 NOTE — ED Provider Notes (Signed)
CSN: [id number]     Arrival date & time 04/12/17  1013 History   None    Chief Complaint  Patient presents with  . Abdominal Pain   (Consider location/radiation/quality/duration/timing/severity/associated sxs/prior Treatment) 31 year old female who was discharged from hospital for acute pancreatitis 9 days ago comes in with one-day history of hematemesis, upper abdominal pain, fever. She was admitted to the hospital due to pancreatitis, and was discharged with protonix, Reglan, and scheduled to see a gastroenterologist the end of next month. Patient states she had blood work and endoscopy done a few days ago, does not know the results. She has not been by mouth tolerance since last night, tried to drink water this morning, with one episode of vomiting with blood. Tmax of 101. She admits to some dizziness, weakness. She denies diarrhea, constipation. Last bowel movement yesterday, which was soft. Admits to some dysuria, denies urinary frequency, hematuria. Denies other URI symptoms such as cough, congestion, sore throat.    The history is limited by a language barrier. A language interpreter was used.    Past Medical History:  Diagnosis Date  . Asthma    Inhaler used 01/02/16  . Diverticulitis   . Nausea & vomiting 04/02/2017  . Pre-diabetes   . Pyelonephritis   . Pyelonephritis    Past Surgical History:  Procedure Laterality Date  . CESAREAN SECTION    . CESAREAN SECTION N/A 2006  . CESAREAN SECTION N/A 09/03/2014   Procedure: CESAREAN SECTION;  Surgeon: Guss Bunde, MD;  Location: [facility];  Service: Obstetrics;  Laterality: N/A;  . CHOLECYSTECTOMY    . ESOPHAGOGASTRODUODENOSCOPY (EGD) WITH PROPOFOL Left 04/07/2017   Procedure: ESOPHAGOGASTRODUODENOSCOPY (EGD) WITH PROPOFOL;  Surgeon: Ronnette Juniper, MD;  Location: [facility];  Service: Gastroenterology;  Laterality: Left;  . TUBAL LIGATION     Family History  Problem Relation Age of Onset  . Hyperlipidemia Mother   . Diabetes  Maternal Uncle   . Diabetes Paternal Grandmother    Social History  Substance Use Topics  . Smoking status: Never Smoker  . Smokeless tobacco: Never Used  . Alcohol use No   OB History    Gravida Para Term Preterm AB Living   5 3 1 2 2 2    SAB TAB Ectopic Multiple Live Births   2     1 2      Review of Systems  Reason unable to perform ROS: See history of present illness as above.    Allergies  Patient has no known allergies.  Home Medications   Prior to Admission medications   Medication Sig Start Date End Date Taking? Authorizing Provider  albuterol (PROVENTIL HFA;VENTOLIN HFA) 108 (90 Base) MCG/ACT inhaler Inhale 1-2 puffs into the lungs every 6 (six) hours as needed for wheezing or shortness of breath.    [provider]  cyclobenzaprine (FLEXERIL) 10 MG tablet Take 1 tablet (10 mg total) by mouth 3 (three) times daily as needed for muscle spasms. Patient not taking: Reported on 04/02/2017 02/06/17   Orpah Greek, MD  metoCLOPramide (REGLAN) 5 MG tablet Take 1 tablet (5 mg total) by mouth 3 (three) times daily before meals. 04/09/17   Nita Sells, MD  pantoprazole (PROTONIX) 40 MG tablet Take 1 tablet (40 mg total) by mouth 2 (two) times daily. 04/09/17   Nita Sells, MD  traMADol (ULTRAM) 50 MG tablet Take 1 tablet (50 mg total) by mouth every 6 (six) hours as needed. Patient not taking: Reported on 04/02/2017 02/06/17  Orpah Greek, MD   Meds Ordered and Administered this Visit  Medications - No data to display  BP 122/71 (BP Location: Left Arm)   Pulse 96   Temp 98.7 F (37.1 C) (Oral)   Resp 16   LMP 03/29/2017   SpO2 100%  No data found.   Physical Exam  Constitutional: She is oriented to person, place, and time. She appears well-developed and well-nourished. No distress.  HENT:  Head: Normocephalic and atraumatic.  Eyes: Pupils are equal, round, and reactive to light. Conjunctivae are normal.  Cardiovascular:  Normal rate, regular rhythm and normal heart sounds.  Exam reveals no gallop and no friction rub.   No murmur heard. Pulmonary/Chest: Effort normal and breath sounds normal. She has no wheezes. She has no rales.  Abdominal: Soft. Bowel sounds are normal. There is no CVA tenderness.  Tenderness on palpation of the upper abdomen, right lower quadrant. Minimal guarding, no rebound. No masses felt. Negative Rovsing's, obturator, psoas sign.  Neurological: She is alert and oriented to person, place, and time.  Skin: Skin is warm and dry.  Psychiatric: She has a normal mood and affect. Her behavior is normal. Judgment normal.    Urgent Care Course     Procedures (including critical care time)  Labs Review Labs Reviewed - No data to display  Imaging Review No results found.     MDM   1. Hematemesis with nausea   2. Right upper quadrant abdominal pain   3. Left upper quadrant pain   4. Right lower quadrant abdominal pain    Discussed with patient given recent admission for acute pancreatitis, recent endoscopy, history and exam, will discharge to emergency department for further evaluation. Given patient option of checking for urine infection given dysuria. Patient will like full workup at the emergency department, discharged in stable condition.   Ok Edwards, PA-C 04/12/17 1137

## 2017-04-12 NOTE — Discharge Instructions (Signed)
Given history of pancreatitis, and recent endoscopy, go to the ED for further evaluation.

## 2017-04-12 NOTE — ED Triage Notes (Signed)
Pt  Reports   abd  Pain      With   Sensation of  Some  Fever  /  Chills     X   3   Weeks      [employer] interpretors  Utilized    pyt   Sitting  Upright on the  Exam table  In no  Acute /   Severe  Distress

## 2017-04-13 ENCOUNTER — Encounter (HOSPITAL_COMMUNITY): Payer: Self-pay

## 2017-04-13 DIAGNOSIS — R1010 Upper abdominal pain, unspecified: Secondary | ICD-10-CM | POA: Insufficient documentation

## 2017-04-13 LAB — CBC
HCT: 39 % (ref 36.0–46.0)
Hemoglobin: 12.9 g/dL (ref 12.0–15.0)
MCH: 29.3 pg (ref 26.0–34.0)
MCHC: 33.1 g/dL (ref 30.0–36.0)
MCV: 88.6 fL (ref 78.0–100.0)
PLATELETS: 292 10*3/uL (ref 150–400)
RBC: 4.4 MIL/uL (ref 3.87–5.11)
RDW: 12.5 % (ref 11.5–15.5)
WBC: 10.9 10*3/uL — AB (ref 4.0–10.5)

## 2017-04-13 LAB — I-STAT BETA HCG BLOOD, ED (MC, WL, AP ONLY)

## 2017-04-13 MED ORDER — ONDANSETRON 4 MG PO TBDP
ORAL_TABLET | ORAL | Status: AC
  Filled 2017-04-13: qty 1

## 2017-04-13 MED ORDER — ONDANSETRON 4 MG PO TBDP
4.0000 mg | ORAL_TABLET | Freq: Once | ORAL | Status: AC | PRN
  Administered 2017-04-13: 4 mg via ORAL

## 2017-04-13 NOTE — ED Triage Notes (Signed)
Pt reports upper abdominal pain, nausea and fever/chills for two days. She also states she feels dizzy. She states she was in the hospital recently for pancreatitis.

## 2017-04-14 ENCOUNTER — Emergency Department (HOSPITAL_COMMUNITY)
Admission: EM | Admit: 2017-04-14 | Discharge: 2017-04-14 | Disposition: A | Discharge status: Home / Self Care | Payer: Medicaid Other | Attending: Emergency Medicine | Admitting: Emergency Medicine

## 2017-04-14 LAB — COMPREHENSIVE METABOLIC PANEL
ALBUMIN: 4.1 g/dL (ref 3.5–5.0)
ALK PHOS: 79 U/L (ref 38–126)
ALT: 31 U/L (ref 14–54)
ANION GAP: 8 (ref 5–15)
AST: 28 U/L (ref 15–41)
BUN: 19 mg/dL (ref 6–20)
CHLORIDE: 108 mmol/L (ref 101–111)
CO2: 22 mmol/L (ref 22–32)
Calcium: 9.4 mg/dL (ref 8.9–10.3)
Creatinine, Ser: 0.72 mg/dL (ref 0.44–1.00)
GFR calc non Af Amer: >60 mL/min (ref >60)
GLUCOSE: 106 mg/dL — AB (ref 65–99)
POTASSIUM: 3.6 mmol/L (ref 3.5–5.1)
SODIUM: 138 mmol/L (ref 135–145)
Total Bilirubin: 0.4 mg/dL (ref 0.3–1.2)
Total Protein: 7.2 g/dL (ref 6.5–8.1)

## 2017-04-14 LAB — LIPASE, BLOOD: LIPASE: 71 U/L — AB (ref 11–51)

## 2017-04-14 NOTE — ED Notes (Signed)
In room to round on patient.  Room empty.  Left without notifying anyone.

## 2017-04-22 ENCOUNTER — Encounter (HOSPITAL_COMMUNITY): Payer: Self-pay | Admitting: Emergency Medicine

## 2017-04-22 DIAGNOSIS — J45909 Unspecified asthma, uncomplicated: Secondary | ICD-10-CM | POA: Insufficient documentation

## 2017-04-22 DIAGNOSIS — R109 Unspecified abdominal pain: Secondary | ICD-10-CM | POA: Insufficient documentation

## 2017-04-22 DIAGNOSIS — Z79899 Other long term (current) drug therapy: Secondary | ICD-10-CM | POA: Insufficient documentation

## 2017-04-22 LAB — COMPREHENSIVE METABOLIC PANEL
ALT: 42 U/L (ref 14–54)
ANION GAP: 7 (ref 5–15)
AST: 31 U/L (ref 15–41)
Albumin: 4.1 g/dL (ref 3.5–5.0)
Alkaline Phosphatase: 84 U/L (ref 38–126)
BUN: 14 mg/dL (ref 6–20)
CHLORIDE: 107 mmol/L (ref 101–111)
CO2: 24 mmol/L (ref 22–32)
CREATININE: 0.65 mg/dL (ref 0.44–1.00)
Calcium: 9.3 mg/dL (ref 8.9–10.3)
Glucose, Bld: 96 mg/dL (ref 65–99)
Potassium: 3.7 mmol/L (ref 3.5–5.1)
SODIUM: 138 mmol/L (ref 135–145)
Total Bilirubin: 0.4 mg/dL (ref 0.3–1.2)
Total Protein: 7 g/dL (ref 6.5–8.1)

## 2017-04-22 LAB — URINALYSIS, ROUTINE W REFLEX MICROSCOPIC
Bilirubin Urine: NEGATIVE
GLUCOSE, UA: NEGATIVE mg/dL
Hgb urine dipstick: NEGATIVE
Ketones, ur: NEGATIVE mg/dL
LEUKOCYTES UA: NEGATIVE
Nitrite: NEGATIVE
PROTEIN: NEGATIVE mg/dL
SPECIFIC GRAVITY, URINE: 1.02 (ref 1.005–1.030)
pH: 6 (ref 5.0–8.0)

## 2017-04-22 LAB — CBC
HCT: 37.2 % (ref 36.0–46.0)
HEMOGLOBIN: 12.1 g/dL (ref 12.0–15.0)
MCH: 29.2 pg (ref 26.0–34.0)
MCHC: 32.5 g/dL (ref 30.0–36.0)
MCV: 89.6 fL (ref 78.0–100.0)
PLATELETS: 360 10*3/uL (ref 150–400)
RBC: 4.15 MIL/uL (ref 3.87–5.11)
RDW: 12.6 % (ref 11.5–15.5)
WBC: 10.7 10*3/uL — AB (ref 4.0–10.5)

## 2017-04-22 LAB — LIPASE, BLOOD: LIPASE: 34 U/L (ref 11–51)

## 2017-04-22 LAB — HCG, QUANTITATIVE, PREGNANCY

## 2017-04-22 MED ORDER — OXYCODONE-ACETAMINOPHEN 5-325 MG PO TABS
ORAL_TABLET | ORAL | Status: AC
Start: 2017-04-22 — End: 2017-04-22
  Administered 2017-04-22: 1
  Filled 2017-04-22: qty 1

## 2017-04-22 MED ORDER — ONDANSETRON 4 MG PO TBDP
ORAL_TABLET | ORAL | Status: AC
  Administered 2017-04-22: 4 mg via ORAL
  Filled 2017-04-22: qty 1

## 2017-04-22 MED ORDER — ONDANSETRON 4 MG PO TBDP
4.0000 mg | ORAL_TABLET | Freq: Once | ORAL | Status: AC
  Administered 2017-04-22: 4 mg via ORAL
  Filled 2017-04-22: qty 1

## 2017-04-22 MED ORDER — OXYCODONE-ACETAMINOPHEN 5-325 MG PO TABS: 1.0000 | ORAL_TABLET | ORAL | Status: DC | PRN

## 2017-04-22 NOTE — ED Triage Notes (Signed)
Pt reports RLQ pain present since this AM with nausea. Denies V/D. Pt has hx of diverticulitis. Pain 6/10, pressure like.

## 2017-04-23 ENCOUNTER — Emergency Department (HOSPITAL_COMMUNITY): Payer: Medicaid Other

## 2017-04-23 ENCOUNTER — Emergency Department (HOSPITAL_COMMUNITY)
Admission: EM | Admit: 2017-04-23 | Discharge: 2017-04-23 | Disposition: A | Discharge status: Home / Self Care | Payer: Medicaid Other | Attending: Emergency Medicine | Admitting: Emergency Medicine

## 2017-04-23 DIAGNOSIS — R109 Unspecified abdominal pain: Secondary | ICD-10-CM

## 2017-04-23 MED ORDER — METOCLOPRAMIDE HCL 5 MG/ML IJ SOLN
10.0000 mg | Freq: Once | INTRAMUSCULAR | Status: AC
  Administered 2017-04-23: 10 mg via INTRAVENOUS
  Filled 2017-04-23: qty 2

## 2017-04-23 MED ORDER — IOPAMIDOL (ISOVUE-300) INJECTION 61%
100.0000 mL | Freq: Once | INTRAVENOUS | Status: AC | PRN
  Administered 2017-04-23: 100 mL via INTRAVENOUS

## 2017-04-23 MED ORDER — PROMETHAZINE HCL 25 MG/ML IJ SOLN
12.5000 mg | Freq: Once | INTRAMUSCULAR | Status: AC
  Administered 2017-04-23: 12.5 mg via INTRAVENOUS
  Filled 2017-04-23: qty 1

## 2017-04-23 MED ORDER — HALOPERIDOL LACTATE 5 MG/ML IJ SOLN
2.0000 mg | Freq: Once | INTRAMUSCULAR | Status: AC
  Administered 2017-04-23: 2 mg via INTRAVENOUS
  Filled 2017-04-23: qty 1

## 2017-04-23 MED ORDER — ONDANSETRON 4 MG PO TBDP: 4.0000 mg | ORAL_TABLET | Freq: Three times a day (TID) | ORAL | 0 refills | Status: DC | PRN

## 2017-04-23 MED ORDER — IOPAMIDOL (ISOVUE-300) INJECTION 61%
INTRAVENOUS | Status: AC
  Filled 2017-04-23: qty 100

## 2017-04-23 MED ORDER — DICYCLOMINE HCL 10 MG/ML IM SOLN
20.0000 mg | Freq: Once | INTRAMUSCULAR | Status: AC
  Administered 2017-04-23: 20 mg via INTRAMUSCULAR
  Filled 2017-04-23: qty 2

## 2017-04-23 MED ORDER — DICYCLOMINE HCL 20 MG PO TABS: 20.0000 mg | ORAL_TABLET | Freq: Two times a day (BID) | ORAL | 0 refills | Status: DC | PRN

## 2017-04-23 MED ORDER — KETOROLAC TROMETHAMINE 30 MG/ML IJ SOLN
30.0000 mg | Freq: Once | INTRAMUSCULAR | Status: AC
  Administered 2017-04-23: 30 mg via INTRAVENOUS
  Filled 2017-04-23: qty 1

## 2017-04-23 MED ORDER — MORPHINE SULFATE (PF) 4 MG/ML IV SOLN
4.0000 mg | Freq: Once | INTRAVENOUS | Status: AC
  Administered 2017-04-23: 4 mg via INTRAVENOUS
  Filled 2017-04-23: qty 1

## 2017-04-23 MED ORDER — IOPAMIDOL (ISOVUE-300) INJECTION 61%
INTRAVENOUS | Status: AC
  Filled 2017-04-23: qty 30

## 2017-04-23 MED ORDER — PROMETHAZINE HCL 25 MG/ML IJ SOLN
12.5000 mg | Freq: Four times a day (QID) | INTRAMUSCULAR | Status: DC | PRN
Start: 2017-04-23 — End: 2017-04-23

## 2017-04-23 MED ORDER — SODIUM CHLORIDE 0.9 % IV BOLUS (SEPSIS)
1000.0000 mL | Freq: Once | INTRAVENOUS | Status: AC
  Administered 2017-04-23: 1000 mL via INTRAVENOUS

## 2017-04-23 NOTE — ED Notes (Signed)
Patient transported to CT 

## 2017-04-23 NOTE — ED Provider Notes (Signed)
Care assumed from previous provider PA [facility]. Please see note for further details. Case discussed, plan agreed upon. Will follow up on pending CT abdomen/pelvis, re-evaluate and dispo appropriately.   CT reviewed:   IMPRESSION: Tiny umbilical hernia containing fat.  Mild distal colonic diverticulosis without evidence of diverticulitis.  No acute intra-abdominal or intrapelvic abnormalities.  Incidentally noted dislodgement of the RIGHT tubal ligation clip which appears free within the anterior pelvis ; this clip is no longer ligating the RIGHT fallopian tube.    Incidental findings discussed with Dr. Nehemiah Settle of OBGYN. He has reviewed CT scan independently as well. He does not feel this is the cause of patient's pain today and feels patient is safe for discharge from an [place] regarding dislodged tubal ligation clip. She will need to be seen in clinic as an outpatient. Women's outpatient clinic information given and patient understands she is to call today to arrange follow up appointment. Pregnancy test today negative. I did inform patient that her clip is no longer ligating the fallopian tube, therefore she will need to use another form of birth control until this has been solved.   Patient does not meet the SIRS or Sepsis criteria. Tolerating PO with no further episodes of emesis after medication administration. Abdominal exam prior to discharge soft with no peritoneal signs. Patient discharged home with symptomatic treatment and encouraged to follow up with GI as well. I have also discussed reasons to return immediately to the ER. Patient expresses understanding and agrees with plan as dictated above.  Patient discussed with Dr. Gilford Raid who agrees with treatment plan.      Ward, Ozella Almond, PA-C 04/23/17 1146    Isla Pence, MD 04/23/17 1235

## 2017-04-23 NOTE — ED Notes (Signed)
Pt tolerating po fluids

## 2017-04-23 NOTE — Discharge Instructions (Addendum)
It was my pleasure taking care of you today!   You need to call the women's outpatient clinic today to schedule an appointment. As we discussed, the clip used when you had your tubes tied has come loose. This does not seem to be the source of your pain, but does need to be addressed as soon as you can. Because this has come loose, you can get pregnant, therefore you need to be using another form of birth control.   You will also need to schedule a follow up appointment with the GI clinic listed.   Zofran as needed for nausea. Bentyl as needed for abdominal cramping.   Return to ER for fever, uncontrolled vomiting, new or worsening symptoms, any additional concerns.   To find a primary care or specialty doctor please call [phone] or [phone] to access "[facility] a Doctor Service."  You may also go on the [place] website at [email or web address]  [facility] - [place] [phone]  [facility] in [place] (also locations in [place] and [place]) - [place] [phone]  [place] - [address] [place] [phone]

## 2017-04-23 NOTE — ED Provider Notes (Signed)
[facility] DEPT Provider Note   CSN: [id number] Arrival date & time: 04/22/17  1745    History   Chief Complaint Chief Complaint  Patient presents with  . Abdominal Pain    HPI Lorraine [name] is a 31 y.o. female.  31 year old female with a history of nausea, vomiting, diverticulitis, pyelonephritis, and chronic abdominal pain presents to the emergency department for abdominal pain which began gradually yesterday afternoon. Pain has continued to worsen. She reports that pain is nonradiating and located in her right mid abdomen. She denies taking any medications prior to arrival for symptoms. She was given a tablet of pain medication in triage. Symptoms associated with nausea as well as vomiting. She reports 2 episodes of emesis this afternoon. Patient with a normal bowel movement in the past 24 hours. She has not had any fevers, but does complain of some chills. No dysuria, hematuria, shortness of breath. Abdominal surgical history significant for cesarean section 3 and cholecystectomy. She was recently admitted in mid July for acute pancreatitis.      Past Medical History:  Diagnosis Date  . Asthma    Inhaler used 01/02/16  . Diverticulitis   . Nausea & vomiting 04/02/2017  . Pre-diabetes   . Pyelonephritis   . Pyelonephritis     Patient Active Problem List   Diagnosis Date Noted  . Acute pancreatitis 04/02/2017  . Abdominal pain, acute, left upper quadrant 04/02/2017  . Intractable nausea and vomiting 04/02/2017  . Asthma in adult 04/02/2017  . Obesity (BMI 30.0-34.9) 04/02/2017  . Fatty infiltration of liver 04/02/2017  . Gastroesophageal reflux disease without esophagitis   . Pre-diabetes 07/15/2015  . Diarrhea   . Hypokalemia   . Bacterial vaginosis   . Pyelonephritis 07/07/2015  . Abdominal pain, right upper quadrant   . Asthma 10/31/2014  . History of preterm delivery 10/22/2014  . Status post repeat low transverse cesarean section 09/06/2014  .  Diverticulitis 09/10/2012    Past Surgical History:  Procedure Laterality Date  . CESAREAN SECTION    . CESAREAN SECTION N/A 2006  . CESAREAN SECTION N/A 09/03/2014   Procedure: CESAREAN SECTION;  Surgeon: Guss Bunde, MD;  Location: [facility];  Service: Obstetrics;  Laterality: N/A;  . CHOLECYSTECTOMY    . ESOPHAGOGASTRODUODENOSCOPY (EGD) WITH PROPOFOL Left 04/07/2017   Procedure: ESOPHAGOGASTRODUODENOSCOPY (EGD) WITH PROPOFOL;  Surgeon: Ronnette Juniper, MD;  Location: [facility];  Service: Gastroenterology;  Laterality: Left;  . TUBAL LIGATION      OB History    Gravida Para Term Preterm AB Living   5 3 1 2 2 2    SAB TAB Ectopic Multiple Live Births   2     1 2        Home Medications    Prior to Admission medications   Medication Sig Start Date End Date Taking? Authorizing Provider  albuterol (PROVENTIL HFA;VENTOLIN HFA) 108 (90 Base) MCG/ACT inhaler Inhale 1-2 puffs into the lungs every 6 (six) hours as needed for wheezing or shortness of breath.   Yes [provider]  metoCLOPramide (REGLAN) 5 MG tablet Take 1 tablet (5 mg total) by mouth 3 (three) times daily before meals. 04/09/17  Yes Nita Sells, MD  pantoprazole (PROTONIX) 40 MG tablet Take 1 tablet (40 mg total) by mouth 2 (two) times daily. 04/09/17  Yes Nita Sells, MD    Family History Family History  Problem Relation Age of Onset  . Hyperlipidemia Mother   . Diabetes Maternal Uncle   . Diabetes Paternal Grandmother  Social History Social History  Substance Use Topics  . Smoking status: Never Smoker  . Smokeless tobacco: Never Used  . Alcohol use No     Allergies   Patient has no known allergies.   Review of Systems Review of Systems Ten systems reviewed and are negative for acute change, except as noted in the HPI.    Physical Exam Updated Vital Signs BP 128/64   Pulse 67   Temp 98 F (36.7 C) (Oral)   Resp 18   Ht 5' (1.524 m)   Wt 83.9 kg (185 lb)   LMP  03/29/2017   SpO2 100%   BMI 36.13 kg/m   Physical Exam  Constitutional: She is oriented to person, place, and time. She appears well-developed and well-nourished. No distress.  Nontoxic appearing and in NAD  HENT:  Head: Normocephalic and atraumatic.  Eyes: Conjunctivae and EOM are normal. No scleral icterus.  Neck: Normal range of motion.  Cardiovascular: Normal rate, regular rhythm and intact distal pulses.   Pulmonary/Chest: Effort normal. No respiratory distress. She has no wheezes.  Respirations even and unlabored  Abdominal: She exhibits no mass. There is tenderness. There is no guarding.  Soft obese abdomen with tenderness just lateral to the umbilicus on the right. No right lower quadrant tenderness or tenderness at McBurney's point. No distention or palpable masses. No peritoneal signs.  Musculoskeletal: Normal range of motion.  Neurological: She is alert and oriented to person, place, and time. She exhibits normal muscle tone. Coordination normal.  GCS 15. Patient moving all extremities.  Skin: Skin is warm and dry. No rash noted. She is not diaphoretic. No erythema. No pallor.  Psychiatric: She has a normal mood and affect. Her behavior is normal.  Nursing note and vitals reviewed.    ED Treatments / Results  Labs (all labs ordered are listed, but only abnormal results are displayed) Labs Reviewed  CBC - Abnormal; Notable for the following:       Result Value   WBC 10.7 (*)    All other components within normal limits  URINALYSIS, ROUTINE W REFLEX MICROSCOPIC - Abnormal; Notable for the following:    APPearance HAZY (*)    All other components within normal limits  LIPASE, BLOOD  COMPREHENSIVE METABOLIC PANEL  HCG, QUANTITATIVE, PREGNANCY    EKG  EKG Interpretation None       Radiology No results found.  Procedures Procedures (including critical care time)  Medications Ordered in ED Medications  ondansetron (ZOFRAN-ODT) disintegrating tablet 4 mg  (not administered)  ondansetron (ZOFRAN-ODT) 4 MG disintegrating tablet (4 mg  Given 04/22/17 1946)  oxyCODONE-acetaminophen (PERCOCET/ROXICET) 5-325 MG per tablet (1 tablet  Given 04/22/17 1945)  dicyclomine (BENTYL) injection 20 mg (20 mg Intramuscular Given 04/23/17 0452)  sodium chloride 0.9 % bolus 1,000 mL (1,000 mLs Intravenous New Bag/Given 04/23/17 0447)  haloperidol lactate (HALDOL) injection 2 mg (2 mg Intravenous Given 04/23/17 0447)  ketorolac (TORADOL) 30 MG/ML injection 30 mg (30 mg Intravenous Given 04/23/17 0447)     Initial Impression / Assessment and Plan / ED Course  I have reviewed the triage vital signs and the nursing notes.  Pertinent labs & imaging results that were available during my care of the patient were reviewed by me and considered in my medical decision making (see chart for details).      21:42 AM 31 year old female presents to the emergency department for abdominal pain. She was admitted approximately 3 weeks ago for abdominal pain which was found  to be secondary to pancreatitis. She underwent subsequent MRCP and reassuring EGD. She reports that her pain today is different. She is focally tender in the right midabdomen. No peritoneal signs on exam. Laboratory workup is reassuring. We will attempt medical management and reassess.  5:10 AM Patient reassessed. She continues to have reproducible pain in her right mid abdomen, though exam is reassuring overall. She does have a mild leukocytosis. Area of pain with nausea and vomiting suspicious for appendicitis, though pain slightly higher riding than what would be anticipated.  Unfortunately, the patient has had no improvement in her symptoms with medical management. I have discussed her recent CT scan and risks of additional radiation should another scan be performed. I have explained to the patient that other imaging would not be adequate for viewing her appendix. She agrees to continue with CT despite risks of radiation.  She acknowledges that frequent CT scans have been linked to potential future development of cancer. All questions answered. Patient expresses comfort with plan of care and imaging. Discussion was completed with use of a Administrator, sports.  5:50 AM Patient pending CT scan at change of shift. Care signed out to [facility], PA-C who will reassess and disposition appropriately.   Final Clinical Impressions(s) / ED Diagnoses   Final diagnoses:  Abdominal pain, unspecified abdominal location    New Prescriptions New Prescriptions   No medications on file     Antonietta Breach, Hershal Coria 04/23/17 5643    Merryl Hacker, MD 04/24/17 [phone]

## 2017-04-24 ENCOUNTER — Encounter (HOSPITAL_COMMUNITY): Payer: Self-pay | Admitting: *Deleted

## 2017-04-24 ENCOUNTER — Inpatient Hospital Stay (HOSPITAL_COMMUNITY)
Admission: AD | Admit: 2017-04-24 | Discharge: 2017-04-24 | Disposition: A | Discharge status: Home / Self Care | Payer: Medicaid Other | Source: Ambulatory Visit | Attending: Obstetrics and Gynecology | Admitting: Obstetrics and Gynecology

## 2017-04-24 DIAGNOSIS — Z9889 Other specified postprocedural states: Secondary | ICD-10-CM | POA: Insufficient documentation

## 2017-04-24 DIAGNOSIS — R7303 Prediabetes: Secondary | ICD-10-CM | POA: Insufficient documentation

## 2017-04-24 DIAGNOSIS — G8929 Other chronic pain: Secondary | ICD-10-CM

## 2017-04-24 DIAGNOSIS — J45909 Unspecified asthma, uncomplicated: Secondary | ICD-10-CM | POA: Insufficient documentation

## 2017-04-24 DIAGNOSIS — Z79899 Other long term (current) drug therapy: Secondary | ICD-10-CM | POA: Insufficient documentation

## 2017-04-24 DIAGNOSIS — Z833 Family history of diabetes mellitus: Secondary | ICD-10-CM | POA: Insufficient documentation

## 2017-04-24 DIAGNOSIS — Z9049 Acquired absence of other specified parts of digestive tract: Secondary | ICD-10-CM | POA: Insufficient documentation

## 2017-04-24 DIAGNOSIS — R109 Unspecified abdominal pain: Secondary | ICD-10-CM | POA: Insufficient documentation

## 2017-04-24 DIAGNOSIS — K429 Umbilical hernia without obstruction or gangrene: Secondary | ICD-10-CM | POA: Insufficient documentation

## 2017-04-24 DIAGNOSIS — R1011 Right upper quadrant pain: Secondary | ICD-10-CM | POA: Diagnosis present

## 2017-04-24 DIAGNOSIS — Z8249 Family history of ischemic heart disease and other diseases of the circulatory system: Secondary | ICD-10-CM | POA: Insufficient documentation

## 2017-04-24 LAB — WET PREP, GENITAL
SPERM: NONE SEEN
TRICH WET PREP: NONE SEEN
Yeast Wet Prep HPF POC: NONE SEEN

## 2017-04-24 LAB — URINALYSIS, ROUTINE W REFLEX MICROSCOPIC
Bilirubin Urine: NEGATIVE
GLUCOSE, UA: NEGATIVE mg/dL
Hgb urine dipstick: NEGATIVE
Ketones, ur: NEGATIVE mg/dL
LEUKOCYTES UA: NEGATIVE
NITRITE: NEGATIVE
PH: 7 (ref 5.0–8.0)
Protein, ur: NEGATIVE mg/dL
Specific Gravity, Urine: 1.005 (ref 1.005–1.030)

## 2017-04-24 LAB — CBC WITH DIFFERENTIAL/PLATELET
BASOS ABS: 0 10*3/uL (ref 0.0–0.1)
Basophils Relative: 1 %
Eosinophils Absolute: 0.2 10*3/uL (ref 0.0–0.7)
Eosinophils Relative: 2 %
HEMATOCRIT: 39.3 % (ref 36.0–46.0)
HEMOGLOBIN: 13.2 g/dL (ref 12.0–15.0)
LYMPHS PCT: 41 %
Lymphs Abs: 3 10*3/uL (ref 0.7–4.0)
MCH: 30.1 pg (ref 26.0–34.0)
MCHC: 33.6 g/dL (ref 30.0–36.0)
MCV: 89.7 fL (ref 78.0–100.0)
MONO ABS: 0.2 10*3/uL (ref 0.1–1.0)
MONOS PCT: 3 %
NEUTROS ABS: 4 10*3/uL (ref 1.7–7.7)
NEUTROS PCT: 53 %
Platelets: 326 10*3/uL (ref 150–400)
RBC: 4.38 MIL/uL (ref 3.87–5.11)
RDW: 12.7 % (ref 11.5–15.5)
WBC: 7.4 10*3/uL (ref 4.0–10.5)

## 2017-04-24 MED ORDER — KETOROLAC TROMETHAMINE 60 MG/2ML IM SOLN
60.0000 mg | Freq: Once | INTRAMUSCULAR | Status: AC
  Administered 2017-04-24: 15:00:00 via INTRAMUSCULAR

## 2017-04-24 MED ORDER — KETOROLAC TROMETHAMINE 60 MG/2ML IM SOLN
INTRAMUSCULAR | Status: AC
  Filled 2017-04-24: qty 2

## 2017-04-24 MED ORDER — TRAMADOL HCL 50 MG PO TABS: 50.0000 mg | ORAL_TABLET | Freq: Four times a day (QID) | ORAL | 0 refills | Status: DC | PRN

## 2017-04-24 NOTE — MAU Note (Addendum)
Pt seen @ [facility] yesterday for R abd pain, states she was told something was wrong with her R fallopian tube.  States she is still having the same pain today.  Denies vaginal bleeding.  C/O nausea & vomiting, denies diarrhea or fever.  Pt states she called [facility] clinic this morning & was told to come to MAU because of her pain.

## 2017-04-24 NOTE — Discharge Instructions (Signed)
Dolor abdominal en adultos Abdominal Pain, Adult El dolor abdominal puede tener muchas causas. A menudo, no es grave y [place] sin tratamiento o con tratamiento en la casa. Sin embargo, a Product/process development scientist abdominal es intenso. El mdico revisar sus antecedentes mdicos y le har un examen fsico para tratar de Office manager causa del dolor abdominal. Siga estas instrucciones en su casa:  Tome los medicamentos de venta libre y los recetados solamente como se lo haya indicado el mdico. No tome un laxante a menos que se lo haya indicado el mdico.  Beba suficiente lquido para Theatre manager la orina clara o de color amarillo plido.  Controle su afeccin para ver si hay cambios.  Concurra a todas las visitas de control como se lo haya indicado el mdico. Esto es importante. Comunquese con un mdico si:  El dolor abdominal cambia o empeora.  No tiene apetito o baja de peso sin proponrselo.  Est estreido o tiene diarrea durante ms de 2 o 3das.  Tiene dolor cuando orina o defeca.  El dolor abdominal lo despierta de noche.  El dolor empeora con las comidas, despus de comer o con determinados alimentos.  Tiene vmitos y no puede retener nada.  Tiene fiebre. Solicite ayuda de inmediato si:  El dolor no desaparece tan pronto como el mdico le dijo que era esperable.  No puede detener los vmitos.  El Social research officer, government se siente solo en zonas del abdomen, como el lado derecho o la parte inferior izquierda del abdomen.  Las heces son sanguinolentas o de color negro, o de aspecto alquitranado.  Tiene dolor intenso, clicos, o meteorismo en el abdomen.  Tiene signos de deshidratacin, por ejemplo: ? Elmon Else, muy escasa o falta de [place]. ? Labios agrietados. ? [employer]. ? Ojos hundidos. ? Somnolencia. ? Debilidad. Esta informacin no tiene Marine scientist el consejo del mdico. Asegrese de hacerle al mdico cualquier pregunta que tenga. Document Released: 09/03/2005 Document  Revised: 08/23/2016 Document Reviewed: 02/15/2016 Elsevier Interactive Patient Education  2017 [employer].

## 2017-04-24 NOTE — MAU Provider Note (Signed)
History     CSN: [id number]  Arrival date and time: 04/24/17 1246   None     Chief Complaint  Patient presents with  . Abdominal Pain   HPI   Lorraine [name] is a 31 y.o. female [phone] here in MAU with RLQ pain. The pain started yesterday. She was seen at [facility] for the pain. She had a CT scan done and was told that a clip came off of her fallopian tube and that is the reason she is having pain. The pain is constant. She was given bentyl for the pain and says it is helping some. She currently rates her pain 6/10. She states that the pain is equal to what she was experiencing yesterday. She is here because she was told that the problem is gynecologic related to the free-floating right fallopian tube Filshie clip. I have personally reviewed the CT scan and the Filshie clip is resting in the middle of some abdominal fat quite a a few centimeters from bowel or abdominal wall, in the midline, inferior to the umbilicus. I do not believe that the source of her pain(jvf)  OB History    Gravida Para Term Preterm AB Living   5 3 1 2 2 3    SAB TAB Ectopic Multiple Live Births   2     1 3       Past Medical History:  Diagnosis Date  . Asthma    Inhaler used 01/02/16  . Diverticulitis   . Nausea & vomiting 04/02/2017  . Pre-diabetes   . Pyelonephritis   . Pyelonephritis     Past Surgical History:  Procedure Laterality Date  . CESAREAN SECTION    . CESAREAN SECTION N/A 2006  . CESAREAN SECTION N/A 09/03/2014   Procedure: CESAREAN SECTION;  Surgeon: Guss Bunde, MD;  Location: [facility];  Service: Obstetrics;  Laterality: N/A;  . CHOLECYSTECTOMY    . ESOPHAGOGASTRODUODENOSCOPY (EGD) WITH PROPOFOL Left 04/07/2017   Procedure: ESOPHAGOGASTRODUODENOSCOPY (EGD) WITH PROPOFOL;  Surgeon: Ronnette Juniper, MD;  Location: [facility];  Service: Gastroenterology;  Laterality: Left;  . TUBAL LIGATION      Family History  Problem Relation Age of Onset  . Hyperlipidemia Mother   . Diabetes  Maternal Uncle   . Diabetes Paternal Grandmother     Social History  Substance Use Topics  . Smoking status: Never Smoker  . Smokeless tobacco: Never Used  . Alcohol use No    Allergies: No Known Allergies  Prescriptions Prior to Admission  Medication Sig Dispense Refill Last Dose  . albuterol (PROVENTIL HFA;VENTOLIN HFA) 108 (90 Base) MCG/ACT inhaler Inhale 1-2 puffs into the lungs every 6 (six) hours as needed for wheezing or shortness of breath.   unk  . dicyclomine (BENTYL) 20 MG tablet Take 1 tablet (20 mg total) by mouth 2 (two) times daily as needed (abdominal cramping). 20 tablet 0   . metoCLOPramide (REGLAN) 5 MG tablet Take 1 tablet (5 mg total) by mouth 3 (three) times daily before meals. 42 tablet 0 04/22/2017 at Unknown time  . ondansetron (ZOFRAN ODT) 4 MG disintegrating tablet Take 1 tablet (4 mg total) by mouth every 8 (eight) hours as needed for nausea or vomiting. 20 tablet 0   . pantoprazole (PROTONIX) 40 MG tablet Take 1 tablet (40 mg total) by mouth 2 (two) times daily. 60 tablet 0 04/22/2017 at Unknown time   Results for orders placed or performed during the hospital encounter of 04/24/17 (from the past 48 hour(s))  Urinalysis, Routine w reflex microscopic     Status: None   Collection Time: 04/24/17  1:04 PM  Result Value Ref Range   Color, Urine YELLOW YELLOW   APPearance CLEAR CLEAR   Specific Gravity, Urine 1.005 1.005 - 1.030   pH 7.0 5.0 - 8.0   Glucose, UA NEGATIVE NEGATIVE mg/dL   Hgb urine dipstick NEGATIVE NEGATIVE   Bilirubin Urine NEGATIVE NEGATIVE   Ketones, ur NEGATIVE NEGATIVE mg/dL   Protein, ur NEGATIVE NEGATIVE mg/dL   Nitrite NEGATIVE NEGATIVE   Leukocytes, UA NEGATIVE NEGATIVE  CBC with Differential     Status: None   Collection Time: 04/24/17  2:18 PM  Result Value Ref Range   WBC 7.4 4.0 - 10.5 K/uL   RBC 4.38 3.87 - 5.11 MIL/uL   Hemoglobin 13.2 12.0 - 15.0 g/dL   HCT 39.3 36.0 - 46.0 %   MCV 89.7 78.0 - 100.0 fL   MCH 30.1 26.0 -  34.0 pg   MCHC 33.6 30.0 - 36.0 g/dL   RDW 12.7 11.5 - 15.5 %   Platelets 326 150 - 400 K/uL   Neutrophils Relative % 53 %   Neutro Abs 4.0 1.7 - 7.7 K/uL   Lymphocytes Relative 41 %   Lymphs Abs 3.0 0.7 - 4.0 K/uL   Monocytes Relative 3 %   Monocytes Absolute 0.2 0.1 - 1.0 K/uL   Eosinophils Relative 2 %   Eosinophils Absolute 0.2 0.0 - 0.7 K/uL   Basophils Relative 1 %   Basophils Absolute 0.0 0.0 - 0.1 K/uL  Wet prep, genital     Status: Abnormal   Collection Time: 04/24/17  2:25 PM  Result Value Ref Range   Yeast Wet Prep HPF POC NONE SEEN NONE SEEN   Trich, Wet Prep NONE SEEN NONE SEEN   Clue Cells Wet Prep HPF POC PRESENT (A) NONE SEEN   WBC, Wet Prep HPF POC FEW (A) NONE SEEN    Comment: MODERATE BACTERIA SEEN   Sperm NONE SEEN     Review of Systems  Constitutional: Negative for chills and fever.  Gastrointestinal: Positive for abdominal pain, nausea and vomiting.  Genitourinary: Negative for dysuria.   Physical Exam   Blood pressure 121/72, pulse 72, temperature 98.5 F (36.9 C), temperature source Oral, resp. rate 16, height 5' (1.524 m), weight 187 lb (84.8 kg), last menstrual period 04/09/2017.  Physical Exam  Constitutional: She appears well-developed and well-nourished.  Alert oriented 3 rating her pain as a constant 5 out of 10 translator was used for this communication.  HENT:  Head: Normocephalic.  Cardiovascular: Normal rate and regular rhythm.   Respiratory: Effort normal.  GI: Soft. Bowel sounds are normal. She exhibits no distension and no mass. There is tenderness. There is no rebound and no guarding.  Abdominal exam includes and the patient identified the precise point of her pain and she points out a 2 cm area just to the right of the umbilicus approximately 3 cm to the right of the midline. Umbilical exam does not identify any incarcerated tissues does not reproduce the pain which seems to be separate from the umbilicus Patient and is asked to  tighten the abdomen firmly and repeat the self-exam and she points to the abdominal wall. I have examined that area and cannot feel any masses defects or areas of hardness. She still describes the abdominal wall palpation as equal in tenderness to this abdomen exam when she is relaxed. This would lead me to think  there is an abdominal wall source to the pain I personally reviewed the CT images and there is no anterior abdominal wall hernias or masses noted there is specifically no evidence of a lateral hernia in this area,.    MAU Course  Procedures  None  MDM  Toradol given 60 mg IM CBC Pain down to 4/10 from 6/10 WBC 7.4  Assessment and Plan  Persistent right sided abdominal pain suspected abdominal wall source 2. Small less than 1 cm umbilical hernia with no identifiable evidence of herniation 3. Free-floating right Filshie clip located in the omental fat in the lower abdomen midline that I do not believe is the source of her pain Plan 1 no further diagnostic testing indicated to this time 2. Will attempt to expedite GYN office visit by Dr. Hulan Fray currently scheduled for 27 August 3. Analgesics refilled to reduce need for recurrent ER visits 4. If workup continues to the point of requiring surgical evaluation by laparoscopy would encourage removal of the Filshie clip just to reduce clinical diagnostic confusion Jonnie Kind, MD  [facility], [place] 04/24/2017, 2:18 PM

## 2017-04-25 ENCOUNTER — Encounter: Payer: Self-pay | Admitting: Pediatric Intensive Care

## 2017-04-25 LAB — PORPHYRINS, FRACTIONATION-PLASMA: Hexacarboxyl Porphyrins: <1 ug/dL (ref 0.0–1.0)

## 2017-04-25 LAB — GC/CHLAMYDIA PROBE AMP (~~LOC~~) NOT AT ARMC
Chlamydia: NEGATIVE
NEISSERIA GONORRHEA: NEGATIVE

## 2017-04-27 ENCOUNTER — Emergency Department (HOSPITAL_COMMUNITY)
Admission: EM | Admit: 2017-04-27 | Discharge: 2017-04-28 | Disposition: A | Discharge status: Home / Self Care | Payer: Medicaid Other | Attending: Emergency Medicine | Admitting: Emergency Medicine

## 2017-04-27 ENCOUNTER — Encounter (HOSPITAL_COMMUNITY): Payer: Self-pay

## 2017-04-27 DIAGNOSIS — Z79899 Other long term (current) drug therapy: Secondary | ICD-10-CM | POA: Insufficient documentation

## 2017-04-27 DIAGNOSIS — R109 Unspecified abdominal pain: Secondary | ICD-10-CM

## 2017-04-27 DIAGNOSIS — J45909 Unspecified asthma, uncomplicated: Secondary | ICD-10-CM | POA: Insufficient documentation

## 2017-04-27 DIAGNOSIS — R112 Nausea with vomiting, unspecified: Secondary | ICD-10-CM | POA: Insufficient documentation

## 2017-04-27 DIAGNOSIS — G8929 Other chronic pain: Secondary | ICD-10-CM | POA: Insufficient documentation

## 2017-04-27 DIAGNOSIS — R1013 Epigastric pain: Secondary | ICD-10-CM | POA: Insufficient documentation

## 2017-04-27 LAB — URINALYSIS, ROUTINE W REFLEX MICROSCOPIC
Bilirubin Urine: NEGATIVE
GLUCOSE, UA: NEGATIVE mg/dL
Hgb urine dipstick: NEGATIVE
Ketones, ur: 80 mg/dL — AB
Nitrite: POSITIVE — AB
PH: 5 (ref 5.0–8.0)
Protein, ur: 30 mg/dL — AB
SPECIFIC GRAVITY, URINE: 1.024 (ref 1.005–1.030)

## 2017-04-27 LAB — CBC
HCT: 38.7 % (ref 36.0–46.0)
Hemoglobin: 13.1 g/dL (ref 12.0–15.0)
MCH: 30 pg (ref 26.0–34.0)
MCHC: 33.9 g/dL (ref 30.0–36.0)
MCV: 88.6 fL (ref 78.0–100.0)
PLATELETS: 316 10*3/uL (ref 150–400)
RBC: 4.37 MIL/uL (ref 3.87–5.11)
RDW: 12.5 % (ref 11.5–15.5)
WBC: 10.9 10*3/uL — ABNORMAL HIGH (ref 4.0–10.5)

## 2017-04-27 LAB — COMPREHENSIVE METABOLIC PANEL
ALBUMIN: 4.8 g/dL (ref 3.5–5.0)
ALT: 57 U/L — ABNORMAL HIGH (ref 14–54)
ANION GAP: 12 (ref 5–15)
AST: 38 U/L (ref 15–41)
Alkaline Phosphatase: 100 U/L (ref 38–126)
BUN: 18 mg/dL (ref 6–20)
CALCIUM: 9.4 mg/dL (ref 8.9–10.3)
CO2: 25 mmol/L (ref 22–32)
CREATININE: 0.79 mg/dL (ref 0.44–1.00)
Chloride: 103 mmol/L (ref 101–111)
GFR calc non Af Amer: >60 mL/min (ref >60)
Glucose, Bld: 103 mg/dL — ABNORMAL HIGH (ref 65–99)
POTASSIUM: 3.6 mmol/L (ref 3.5–5.1)
SODIUM: 140 mmol/L (ref 135–145)
Total Bilirubin: 0.5 mg/dL (ref 0.3–1.2)
Total Protein: 8.7 g/dL — ABNORMAL HIGH (ref 6.5–8.1)

## 2017-04-27 LAB — LIPASE, BLOOD: LIPASE: 22 U/L (ref 11–51)

## 2017-04-27 MED ORDER — SODIUM CHLORIDE 0.9 % IV BOLUS (SEPSIS)
1000.0000 mL | Freq: Once | INTRAVENOUS | Status: AC
  Administered 2017-04-27: 1000 mL via INTRAVENOUS

## 2017-04-27 MED ORDER — PROMETHAZINE HCL 25 MG/ML IJ SOLN
12.5000 mg | Freq: Once | INTRAMUSCULAR | Status: AC
  Administered 2017-04-27: 12.5 mg via INTRAVENOUS
  Filled 2017-04-27: qty 1

## 2017-04-27 MED ORDER — MORPHINE SULFATE (PF) 2 MG/ML IV SOLN
4.0000 mg | Freq: Once | INTRAVENOUS | Status: AC
  Administered 2017-04-27: 4 mg via INTRAVENOUS
  Filled 2017-04-27: qty 2

## 2017-04-27 MED ORDER — PROMETHAZINE HCL 12.5 MG PO TABS: 12.5000 mg | ORAL_TABLET | Freq: Four times a day (QID) | ORAL | 0 refills | Status: DC | PRN

## 2017-04-27 NOTE — ED Triage Notes (Signed)
States n/v and abdominal pain since last pm has had same pain before no fever or dysuria voiced.

## 2017-04-27 NOTE — ED Provider Notes (Signed)
[facility] DEPT Provider Note   CSN: [id number] Arrival date & time: 04/27/17  1935     History   Chief Complaint Chief Complaint  Patient presents with  . Abdominal Pain    HPI Lorraine [name] is a 31 y.o. female with chronic abdominal pain and multiple ED visits for similar symptoms, and he with acute onset of epigastric abdominal pain with associated nausea and vomiting that began last night. Patient states pain and symptoms are very similar to her recent visit on 04/24/2017. Per chart review CT abdomen was negative, with reassuring labs. Patient was discharged with Bentyl and Zofran for her symptoms. She reports today saying Zofran did not improve her nausea. States her last bowel movement today and normal. No other complaints today.  The history is provided by the patient.    Past Medical History:  Diagnosis Date  . Asthma    Inhaler used 01/02/16  . Diverticulitis   . Nausea & vomiting 04/02/2017  . Pre-diabetes   . Pyelonephritis   . Pyelonephritis     Patient Active Problem List   Diagnosis Date Noted  . Acute pancreatitis 04/02/2017  . Abdominal pain, acute, left upper quadrant 04/02/2017  . Intractable nausea and vomiting 04/02/2017  . Asthma in adult 04/02/2017  . Obesity (BMI 30.0-34.9) 04/02/2017  . Fatty infiltration of liver 04/02/2017  . Gastroesophageal reflux disease without esophagitis   . Pre-diabetes 07/15/2015  . Diarrhea   . Hypokalemia   . Bacterial vaginosis   . Pyelonephritis 07/07/2015  . Abdominal pain, right upper quadrant   . Asthma 10/31/2014  . History of preterm delivery 10/22/2014  . Status post repeat low transverse cesarean section 09/06/2014  . Diverticulitis 09/10/2012    Past Surgical History:  Procedure Laterality Date  . CESAREAN SECTION    . CESAREAN SECTION N/A 2006  . CESAREAN SECTION N/A 09/03/2014   Procedure: CESAREAN SECTION;  Surgeon: Guss Bunde, MD;  Location: [facility];  Service: Obstetrics;   Laterality: N/A;  . CHOLECYSTECTOMY    . ESOPHAGOGASTRODUODENOSCOPY (EGD) WITH PROPOFOL Left 04/07/2017   Procedure: ESOPHAGOGASTRODUODENOSCOPY (EGD) WITH PROPOFOL;  Surgeon: Ronnette Juniper, MD;  Location: [facility];  Service: Gastroenterology;  Laterality: Left;  . TUBAL LIGATION      OB History    Gravida Para Term Preterm AB Living   5 3 1 2 2 3    SAB TAB Ectopic Multiple Live Births   2     1 3        Home Medications    Prior to Admission medications   Medication Sig Start Date End Date Taking? Authorizing Provider  albuterol (PROVENTIL HFA;VENTOLIN HFA) 108 (90 Base) MCG/ACT inhaler Inhale 1-2 puffs into the lungs every 6 (six) hours as needed for wheezing or shortness of breath.    [provider]  dicyclomine (BENTYL) 20 MG tablet Take 1 tablet (20 mg total) by mouth 2 (two) times daily as needed (abdominal cramping). 04/23/17   Ward, Ozella Almond, PA-C  metoCLOPramide (REGLAN) 5 MG tablet Take 1 tablet (5 mg total) by mouth 3 (three) times daily before meals. 04/09/17   Nita Sells, MD  ondansetron (ZOFRAN ODT) 4 MG disintegrating tablet Take 1 tablet (4 mg total) by mouth every 8 (eight) hours as needed for nausea or vomiting. 04/23/17   Ward, Ozella Almond, PA-C  pantoprazole (PROTONIX) 40 MG tablet Take 1 tablet (40 mg total) by mouth 2 (two) times daily. 04/09/17   Nita Sells, MD  promethazine (PHENERGAN) 12.5 MG tablet  Take 1 tablet (12.5 mg total) by mouth every 6 (six) hours as needed for nausea or vomiting. 04/27/17   Russo, [place] N, PA-C  traMADol (ULTRAM) 50 MG tablet Take 1 tablet (50 mg total) by mouth every 6 (six) hours as needed for moderate pain or severe pain. 04/24/17   Jonnie Kind, MD    Family History Family History  Problem Relation Age of Onset  . Hyperlipidemia Mother   . Diabetes Maternal Uncle   . Diabetes Paternal Grandmother     Social History Social History  Substance Use Topics  . Smoking status: Never Smoker  .  Smokeless tobacco: Never Used  . Alcohol use No     Allergies   Patient has no known allergies.   Review of Systems Review of Systems  Constitutional: Negative for fever.  HENT: Negative for trouble swallowing.   Respiratory: Negative for shortness of breath.   Cardiovascular: Negative for chest pain.  Gastrointestinal: Positive for abdominal pain (Epigastric), nausea and vomiting. Negative for blood in stool, constipation and diarrhea.  Genitourinary: Negative for dysuria and frequency.  Musculoskeletal: Negative.   Skin: Negative.      Physical Exam Updated Vital Signs BP 112/82 (BP Location: Left Arm)   Pulse 77   Temp 98.6 F (37 C) (Oral)   Resp 18   Ht 5\' 5"  (1.651 m)   Wt 84.8 kg (187 lb)   LMP 04/09/2017   SpO2 99%   BMI 31.12 kg/m   Physical Exam  Constitutional: She appears well-developed and well-nourished.  HENT:  Head: Normocephalic and atraumatic.  Mouth/Throat: Oropharynx is clear and moist.  Eyes: Conjunctivae are normal.  Cardiovascular: Normal rate, regular rhythm, normal heart sounds and intact distal pulses.  Exam reveals no friction rub.   No murmur heard. Pulmonary/Chest: Effort normal and breath sounds normal. No respiratory distress. She has no wheezes. She has no rales.  Abdominal: Soft. Normal appearance and bowel sounds are normal. She exhibits no distension and no mass. There is generalized tenderness (mild). There is no rigidity, no rebound, no guarding, no CVA tenderness and negative Murphy's sign. No hernia.  Neurological: She is alert.  Skin: Skin is warm.  Psychiatric: She has a normal mood and affect. Her behavior is normal.  Nursing note and vitals reviewed.    ED Treatments / Results  Labs (all labs ordered are listed, but only abnormal results are displayed) Labs Reviewed  COMPREHENSIVE METABOLIC PANEL - Abnormal; Notable for the following:       Result Value   Glucose, Bld 103 (*)    Total Protein 8.7 (*)    ALT 57 (*)     All other components within normal limits  CBC - Abnormal; Notable for the following:    WBC 10.9 (*)    All other components within normal limits  URINALYSIS, ROUTINE W REFLEX MICROSCOPIC - Abnormal; Notable for the following:    APPearance CLOUDY (*)    Ketones, ur 80 (*)    Protein, ur 30 (*)    Nitrite POSITIVE (*)    Leukocytes, UA SMALL (*)    Bacteria, UA RARE (*)    Squamous Epithelial / LPF TOO NUMEROUS TO COUNT (*)    All other components within normal limits  LIPASE, BLOOD  POC URINE PREG, ED    EKG  EKG Interpretation None       Radiology No results found.  Procedures Procedures (including critical care time)  Medications Ordered in ED Medications  morphine  2 MG/ML injection 4 mg (4 mg Intravenous Given 04/27/17 2210)  promethazine (PHENERGAN) injection 12.5 mg (12.5 mg Intravenous Given 04/27/17 2209)  sodium chloride 0.9 % bolus 1,000 mL (1,000 mLs Intravenous New Bag/Given 04/27/17 2208)     Initial Impression / Assessment and Plan / ED Course  I have reviewed the triage vital signs and the nursing notes.  Pertinent labs & imaging results that were available during my care of the patient were reviewed by me and considered in my medical decision making (see chart for details).     Patient is chronic abdominal pain, presenting with epigastric abdominal pain and nausea. Patient's symptoms are similar to chronic abdominal pain. Abdominal exam with mild generalized tenderness, no peritoneal signs or guarding, abdomen is soft. CT abdomen done on 04/24/2017 without acute pathology. CBC, CMP, lipase unremarkable. Urine is a dirty sample. Patient is afebrile, hemodynamically stable. Pain and nausea managed in ED with morphine and Phenergan. Patient is agreeable to discharge with symptomatically management. Patient has GI appointment on August 22, encouraged patient to attend this appointment. Strict return precautions given. Patient is safe for discharge  home.  Patient discussed with Dr. Gilford Raid, who agrees with care plan.  Discussed results, findings, treatment and follow up. Patient advised of return precautions. Patient verbalized understanding and agreed with plan.   Final Clinical Impressions(s) / ED Diagnoses   Final diagnoses:  Chronic abdominal pain    New Prescriptions New Prescriptions   PROMETHAZINE (PHENERGAN) 12.5 MG TABLET    Take 1 tablet (12.5 mg total) by mouth every 6 (six) hours as needed for nausea or vomiting.     Russo, Martin[place] N, PA-C 04/28/17 0024    Isla Pence, MD 04/28/17 [phone]

## 2017-04-27 NOTE — Discharge Instructions (Signed)
Please read instructions below. Stop taking zofran/ondansetron for nausea if this is not helping. Begin taking phenergan every 6 hours as needed for nausea.  Continue taking bentyl for abdominal pain. Attend your appointment with the gastroenterology specialists on August 27th. Return to the ER for new or concerning symptoms.  Por favor, lea las instrucciones a continuacin. Deje de tomar zofran / ondansetron por nuseas si esto no ayuda. Comience a tomar phenergan cada 6 horas segn sea necesario para las nuseas. Contine tomando bentyl para el dolor abdominal. Asista a su cita con los especialistas en gastroenterologa el 4 de agosto. Regrese a la sala de emergencias para sntomas nuevos o preocupantes.

## 2017-05-13 ENCOUNTER — Ambulatory Visit (INDEPENDENT_AMBULATORY_CARE_PROVIDER_SITE_OTHER): Payer: Medicaid Other | Admitting: Obstetrics & Gynecology

## 2017-05-13 ENCOUNTER — Encounter: Payer: Self-pay | Admitting: Obstetrics & Gynecology

## 2017-05-13 ENCOUNTER — Ambulatory Visit (INDEPENDENT_AMBULATORY_CARE_PROVIDER_SITE_OTHER): Payer: Self-pay | Admitting: Clinical

## 2017-05-13 VITALS — BP 118/71 | HR 71 | Temp 98.4°F | Wt 185.9 lb

## 2017-05-13 DIAGNOSIS — Z3202 Encounter for pregnancy test, result negative: Secondary | ICD-10-CM

## 2017-05-13 DIAGNOSIS — F329 Major depressive disorder, single episode, unspecified: Secondary | ICD-10-CM

## 2017-05-13 DIAGNOSIS — F32A Depression, unspecified: Secondary | ICD-10-CM

## 2017-05-13 DIAGNOSIS — Z0000 Encounter for general adult medical examination without abnormal findings: Secondary | ICD-10-CM

## 2017-05-13 DIAGNOSIS — F4323 Adjustment disorder with mixed anxiety and depressed mood: Secondary | ICD-10-CM

## 2017-05-13 DIAGNOSIS — R1011 Right upper quadrant pain: Secondary | ICD-10-CM

## 2017-05-13 LAB — POCT PREGNANCY, URINE: Preg Test, Ur: NEGATIVE
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
## 2017-05-13 NOTE — Progress Notes (Addendum)
   Subjective:    Patient ID: Lorraine [name], female    DOB: 1986-03-31, 31 y.o.   MRN: [id number]  HPI  [name] is a 31 y.o. female [phone] here in the clinic with RUQ pain, LUQ pain, and epigastric pain. The pain has been present for about a month, is worse with eating although it is constant. She has been seen in the [facility] and MAU. A CT and U/S are normal but do show a free floating Filsche clip.  Please note that she was admitted to hospital in July for about 10 days with pancreatitis. She had an EGD.  Her periods are generally monthly, but sometimes skips and sometimes has 2 periods per month. The pain is worse with her periods. She takes IBU, Tylenol and get some help.    Review of Systems She has her tubes tied with her third c/s.    Objective:   Physical Exam Well nourished, well hydrated Hispanic female, no apparent distress Breathing, conversing, and ambulating normally Abd -obese, benign      Assessment & Plan:  Pain- I agree with Dr. Glo Herring that this is not a likely cause of her pain, but I have offered to do a laparoscopy and remove it and look for an etiology of her pain.  She understands the risks of surgery, including, but not to infection, bleeding, DVTs, damage to bowel, bladder, ureters. She has had 4 abdominal surgeries. She will think about this and will let me know.  She would like a pregnancy test today.  I have suggested that if she does not have a laparoscopy, that she should have a HSG to determine if her tubes are both still closed. She would like to do this.  She will see Lorraine [name] today about her anxiety.  I will refer her to a GI.

## 2017-05-13 NOTE — Addendum Note (Signed)
Addended by: Phillip Heal, Imani Sherrin A on: 05/13/2017 10:33 AM   Modules accepted: Orders

## 2017-05-13 NOTE — Patient Instructions (Signed)
My Safety Plan:   Step 1: Warning signs (thoughts, images, mood, situation, behavior) that a crisis may be developing: If I feel like sleeping and not waking up after talking to my dad  Step 2: Internal coping strategies: Things I can do to take my mind off my problems without contacting another person (music, relaxation technique, physical activity):   Step 3: People I can ask for help:  Name, relationship, contact: [place], [phone]   Step 5: Agencies I can contact during a crisis:      1. 9-1-1     2. [employer] (24/7 walk-in) [address] [place], [place]     3. [place]: Intake- [phone]     4. Closest Emergency Room Address: [place](See below)  Suicide Prevention Lifeline Phone: [phone]  Step 6: Making the environment safe- Have friend or family remove from home: I don't have these thoughts * Weapons in the home * Medication in the home (including Tylenol)  Step 7: The one thing that is most important to me and worth living for is: My children  Signature of Patient: _________________________________________________  Signature of Provider: ________________________________________________  [facility] [place], [place], [place]. [facility] [address] [place], [place]  [facility] [address], [place], [place]  [facility] [address] [phone]  [facility] [address] [place], [place] +

## 2017-05-13 NOTE — Progress Notes (Signed)
Patient scored 13 on PHQ-9, with thoughts of self harm several days in the past 2 weeks. Patient and/or legal guardian verbally consented to meet with [place] about presenting concerns. Spanish interpreter present for visit.

## 2017-05-22 NOTE — Congregational Nurse Program (Signed)
Congregational Nurse Program Note  Date of Encounter: 04/25/2017  Past Medical History: Past Medical History:  Diagnosis Date  . Asthma    Inhaler used 01/02/16  . Diverticulitis   . Nausea & vomiting 04/02/2017  . Pre-diabetes   . Pyelonephritis   . Pyelonephritis     Encounter Details:     CNP Questionnaire - 04/25/17 1515      Patient Demographics   Is this a new or existing patient? New   Patient is considered a/an Immigrant   Race Latino/Hispanic     Patient Assistance   Location of Patient Assistance [employer]   Patient's financial/insurance status Self-Pay (Uninsured)   Uninsured Patient ([place] Card/Care Connects) Yes   Interventions Follow-up/Education/Support provided after completed appt.   Patient referred to apply for the following financial assistance [place] insecurities addressed Not Applicable   Transportation assistance No   Assistance securing medications No   Educational health offerings Navigating the healthcare system     Encounter Details   Primary purpose of visit Education/Health Concerns;Navigating the Healthcare System   Was an Emergency Department visit averted? Not Applicable   Does patient have a medical provider? Yes   Patient referred to Establish PCP   Was a mental health screening completed? (GAINS tool) No   Does patient have dental issues? No   Does patient have vision issues? No   Does your patient have an abnormal blood pressure today? No   Since previous encounter, have you referred patient for abnormal blood pressure that resulted in a new diagnosis or medication change? No   Does your patient have an abnormal blood glucose today? No   Since previous encounter, have you referred patient for abnormal blood glucose that resulted in a new diagnosis or medication change? No   Was there a life-saving intervention made? No    Via interpreter Magda Paganini- client has abdominal pain-  describes as mostly epigastric. She states that she was recently in the hospital dues to nausea and vomiting. States that while she was in hospital, it was discovered that "her fallopian tube became untied". Client is concerned about getting pregnant and is concerned that this is the source of her pain. CN reviewed chart- client had been inpatient due to pancreatitis and it was discovered that a Fallopian tube clip is no dislodged. It was recommended that client have follow up with GI doctor. Client states that she did not have resources to follow up. It appears that she has Medicaid. CN will determine if client has active Medicaid ID with case worker and link client to PCP.

## 2017-05-28 ENCOUNTER — Ambulatory Visit (HOSPITAL_COMMUNITY)
Admission: EM | Admit: 2017-05-28 | Discharge: 2017-05-28 | Disposition: A | Discharge status: Home / Self Care | Payer: Self-pay | Attending: Family Medicine | Admitting: Family Medicine

## 2017-05-28 DIAGNOSIS — Z3202 Encounter for pregnancy test, result negative: Secondary | ICD-10-CM

## 2017-05-28 DIAGNOSIS — J209 Acute bronchitis, unspecified: Secondary | ICD-10-CM

## 2017-05-28 DIAGNOSIS — R109 Unspecified abdominal pain: Secondary | ICD-10-CM

## 2017-05-28 DIAGNOSIS — R5383 Other fatigue: Secondary | ICD-10-CM

## 2017-05-28 LAB — POCT URINALYSIS DIP (DEVICE)
Bilirubin Urine: NEGATIVE
Glucose, UA: NEGATIVE mg/dL
HGB URINE DIPSTICK: NEGATIVE
Ketones, ur: NEGATIVE mg/dL
Leukocytes, UA: NEGATIVE
NITRITE: NEGATIVE
PH: 6 (ref 5.0–8.0)
Protein, ur: 30 mg/dL — AB
UROBILINOGEN UA: 0.2 mg/dL (ref 0.0–1.0)

## 2017-05-28 LAB — POCT PREGNANCY, URINE: Preg Test, Ur: NEGATIVE

## 2017-05-28 MED ORDER — PREDNISONE 50 MG PO TABS: ORAL_TABLET | ORAL | 0 refills | Status: DC

## 2017-05-28 MED ORDER — GUAIFENESIN-CODEINE 100-10 MG/5ML PO SOLN: 5.0000 mL | Freq: Three times a day (TID) | ORAL | 0 refills | Status: DC | PRN

## 2017-05-28 MED ORDER — AZITHROMYCIN 250 MG PO TABS: 250.0000 mg | ORAL_TABLET | Freq: Every day | ORAL | 0 refills | Status: DC

## 2017-05-28 NOTE — ED Triage Notes (Signed)
PT seen and triaged by Barnet Glasgow, NP with translator

## 2017-05-28 NOTE — Discharge Instructions (Signed)
I am treating you for bronchitis. I have prescribed Azithromycin. Take 2 tablets today, then 1 tablet daily till finished. I have also prescribed a steroid called prednisone. Take one tablet daily with food. I have also prescribed a medicine for Cough called Cheratussin, this medicine is a narcotic, it will cause drowsiness, and it is addictive. Do not take more than what is necessary, do not drink alcohol while taking, and do not operate any heavy machinery while taking this medicine.  Take 1 tablet every 8 hours as needed for your cough. Should your symptoms fail to improve or worsen, follow up with your primary care provider, or return to clinic.

## 2017-05-28 NOTE — ED Provider Notes (Signed)
[place]   [id number] 05/28/17 Arrival Time: 1950   SUBJECTIVE:  Lorraine [name] is a 31 y.o. female who presents to the urgent care with complaint of cough, fatigue, and right sided flank pain. This has been on going for more than 1 week, cough is described as dry, hacking, non-productive. She does not smoke but does have hx of Asthma. With regard to flank pain, no fever, chills, n/v, or dysuria.      Past Medical History:  Diagnosis Date  . Asthma    Inhaler used 01/02/16  . Diverticulitis   . Nausea & vomiting 04/02/2017  . Pre-diabetes   . Pyelonephritis   . Pyelonephritis    Family History  Problem Relation Age of Onset  . Hyperlipidemia Mother   . Diabetes Maternal Uncle   . Diabetes Paternal Grandmother    Social History   Social History  . Marital status: Married    Spouse name: N/A  . Number of children: N/A  . Years of education: N/A   Occupational History  . Not on file.   Social History Main Topics  . Smoking status: Never Smoker  . Smokeless tobacco: Never Used  . Alcohol use No  . Drug use: No  . Sexual activity: Yes    Birth control/ protection: Surgical   Other Topics Concern  . Not on file   Social History Narrative   ** Merged History Encounter **       No outpatient prescriptions have been marked as taking for the 05/28/17 encounter [facility] Encounter).   No Known Allergies    ROS: As per HPI, remainder of ROS negative.   OBJECTIVE:   Vitals:   05/28/17 2033  BP: 114/74  Pulse: 81  Resp: 16  Temp: 98.2 F (36.8 C)  SpO2: 100%     General appearance: alert; no distress Eyes: PERRL; EOMI; conjunctiva normal HENT: normocephalic; atraumatic;  Neck: supple, no JVD noted Lungs: clear to auscultation bilaterally Heart: regular rate and rhythm Abdomen: soft, non-tender; bowel sounds normal; no masses or organomegaly; no guarding or rebound tenderness Back: Right sided flank pain Extremities: no cyanosis or  edema; symmetrical with no gross deformities Skin: warm and dry Neurologic: normal gait; grossly normal Psychological: alert and cooperative; normal mood and affect      Labs:  Results for orders placed or performed during the hospital encounter of 05/28/17  POCT urinalysis dip (device)  Result Value Ref Range   Glucose, UA NEGATIVE NEGATIVE mg/dL   Bilirubin Urine NEGATIVE NEGATIVE   Ketones, ur NEGATIVE NEGATIVE mg/dL   Specific Gravity, Urine >=1.030 1.005 - 1.030   Hgb urine dipstick NEGATIVE NEGATIVE   pH 6.0 5.0 - 8.0   Protein, ur 30 (A) NEGATIVE mg/dL   Urobilinogen, UA 0.2 0.0 - 1.0 mg/dL   Nitrite NEGATIVE NEGATIVE   Leukocytes, UA NEGATIVE NEGATIVE  Pregnancy, urine POC  Result Value Ref Range   Preg Test, Ur NEGATIVE NEGATIVE    Labs Reviewed  POCT URINALYSIS DIP (DEVICE) - Abnormal; Notable for the following:       Result Value   Protein, ur 30 (*)    All other components within normal limits  POCT PREGNANCY, URINE    No results found.     ASSESSMENT & PLAN:  1. Acute bronchitis, unspecified organism     Meds ordered this encounter  Medications  . azithromycin (ZITHROMAX) 250 MG tablet    Sig: Take 1 tablet (250 mg total) by mouth daily. Take  first 2 tablets together, then 1 every day until finished.    Dispense:  6 tablet    Refill:  0    Order Specific Question:   Supervising Provider    Answer:   Robyn Haber [[id number]]  . predniSONE (DELTASONE) 50 MG tablet    Sig: Take 1 tablet daily with food    Dispense:  5 tablet    Refill:  0    Order Specific Question:   Supervising Provider    Answer:   Robyn Haber [[id number]]  . guaiFENesin-codeine 100-10 MG/5ML syrup    Sig: Take 5 mLs by mouth 3 (three) times daily as needed for cough.    Dispense:  120 mL    Refill:  0    Order Specific Question:   Supervising Provider    Answer:   Robyn Haber [[id number]]   UA reassuring, will treat for bronchitis.  Reviewed expectations re: course of  current medical issues. Questions answered. Outlined signs and symptoms indicating need for more acute intervention. Patient verbalized understanding. After Visit Summary given.    Procedures:        Barnet Glasgow, NP 05/28/17 2202

## 2017-06-02 ENCOUNTER — Other Ambulatory Visit: Payer: Self-pay

## 2017-06-02 ENCOUNTER — Encounter (HOSPITAL_COMMUNITY): Payer: Self-pay | Admitting: Emergency Medicine

## 2017-06-02 ENCOUNTER — Emergency Department (HOSPITAL_COMMUNITY)
Admission: EM | Admit: 2017-06-02 | Discharge: 2017-06-02 | Disposition: A | Discharge status: Home / Self Care | Payer: Self-pay | Attending: Emergency Medicine | Admitting: Emergency Medicine

## 2017-06-02 ENCOUNTER — Emergency Department (HOSPITAL_COMMUNITY): Payer: Self-pay

## 2017-06-02 DIAGNOSIS — J45909 Unspecified asthma, uncomplicated: Secondary | ICD-10-CM | POA: Insufficient documentation

## 2017-06-02 DIAGNOSIS — R109 Unspecified abdominal pain: Secondary | ICD-10-CM | POA: Insufficient documentation

## 2017-06-02 DIAGNOSIS — R059 Cough, unspecified: Secondary | ICD-10-CM

## 2017-06-02 DIAGNOSIS — R0789 Other chest pain: Secondary | ICD-10-CM | POA: Insufficient documentation

## 2017-06-02 DIAGNOSIS — E876 Hypokalemia: Secondary | ICD-10-CM | POA: Insufficient documentation

## 2017-06-02 DIAGNOSIS — R002 Palpitations: Secondary | ICD-10-CM | POA: Insufficient documentation

## 2017-06-02 DIAGNOSIS — R0602 Shortness of breath: Secondary | ICD-10-CM | POA: Insufficient documentation

## 2017-06-02 DIAGNOSIS — R05 Cough: Secondary | ICD-10-CM | POA: Insufficient documentation

## 2017-06-02 LAB — TROPONIN I

## 2017-06-02 LAB — CBC
HEMATOCRIT: 38.2 % (ref 36.0–46.0)
HEMOGLOBIN: 13 g/dL (ref 12.0–15.0)
MCH: 30.2 pg (ref 26.0–34.0)
MCHC: 34 g/dL (ref 30.0–36.0)
MCV: 88.8 fL (ref 78.0–100.0)
Platelets: 297 10*3/uL (ref 150–400)
RBC: 4.3 MIL/uL (ref 3.87–5.11)
RDW: 12.6 % (ref 11.5–15.5)
WBC: 9.3 10*3/uL (ref 4.0–10.5)

## 2017-06-02 LAB — URINALYSIS, ROUTINE W REFLEX MICROSCOPIC
Bilirubin Urine: NEGATIVE
GLUCOSE, UA: NEGATIVE mg/dL
Hgb urine dipstick: NEGATIVE
Ketones, ur: 5 mg/dL — AB
LEUKOCYTES UA: NEGATIVE
Nitrite: NEGATIVE
PH: 5 (ref 5.0–8.0)
Protein, ur: NEGATIVE mg/dL
SPECIFIC GRAVITY, URINE: 1.026 (ref 1.005–1.030)

## 2017-06-02 LAB — COMPREHENSIVE METABOLIC PANEL
ALBUMIN: 4.3 g/dL (ref 3.5–5.0)
ALK PHOS: 100 U/L (ref 38–126)
ALT: 33 U/L (ref 14–54)
AST: 25 U/L (ref 15–41)
Anion gap: 9 (ref 5–15)
BUN: 13 mg/dL (ref 6–20)
CALCIUM: 9.3 mg/dL (ref 8.9–10.3)
CO2: 24 mmol/L (ref 22–32)
CREATININE: 0.71 mg/dL (ref 0.44–1.00)
Chloride: 105 mmol/L (ref 101–111)
GFR calc Af Amer: >60 mL/min (ref >60)
GFR calc non Af Amer: >60 mL/min (ref >60)
GLUCOSE: 128 mg/dL — AB (ref 65–99)
Potassium: 3.1 mmol/L — ABNORMAL LOW (ref 3.5–5.1)
SODIUM: 138 mmol/L (ref 135–145)
Total Bilirubin: 0.4 mg/dL (ref 0.3–1.2)
Total Protein: 7.9 g/dL (ref 6.5–8.1)

## 2017-06-02 LAB — POCT PREGNANCY, URINE: PREG TEST UR: NEGATIVE

## 2017-06-02 LAB — MAGNESIUM: Magnesium: 2.2 mg/dL (ref 1.7–2.4)

## 2017-06-02 LAB — D-DIMER, QUANTITATIVE: D-Dimer, Quant: <0.27 ug/mL-FEU (ref 0.00–0.50)

## 2017-06-02 LAB — LIPASE, BLOOD: Lipase: 22 U/L (ref 11–51)

## 2017-06-02 MED ORDER — FAMOTIDINE IN NACL 20-0.9 MG/50ML-% IV SOLN
20.0000 mg | Freq: Once | INTRAVENOUS | Status: DC
  Filled 2017-06-02: qty 50

## 2017-06-02 MED ORDER — FAMOTIDINE 20 MG PO TABS: 20.0000 mg | ORAL_TABLET | Freq: Two times a day (BID) | ORAL | 0 refills | Status: DC

## 2017-06-02 MED ORDER — POTASSIUM CHLORIDE CRYS ER 20 MEQ PO TBCR: 20.0000 meq | EXTENDED_RELEASE_TABLET | Freq: Every day | ORAL | 0 refills | Status: DC

## 2017-06-02 MED ORDER — ALBUTEROL SULFATE HFA 108 (90 BASE) MCG/ACT IN AERS
2.0000 | INHALATION_SPRAY | Freq: Once | RESPIRATORY_TRACT | Status: AC
  Administered 2017-06-02: 2 via RESPIRATORY_TRACT
  Filled 2017-06-02: qty 6.7

## 2017-06-02 MED ORDER — BENZONATATE 100 MG PO CAPS: 100.0000 mg | ORAL_CAPSULE | Freq: Three times a day (TID) | ORAL | 0 refills | Status: DC

## 2017-06-02 MED ORDER — ALBUTEROL SULFATE HFA 108 (90 BASE) MCG/ACT IN AERS: 2.0000 | INHALATION_SPRAY | Freq: Once | RESPIRATORY_TRACT | Status: DC

## 2017-06-02 MED ORDER — HYDROCOD POLST-CPM POLST ER 10-8 MG/5ML PO SUER
5.0000 mL | Freq: Once | ORAL | Status: AC
  Administered 2017-06-02: 5 mL via ORAL
  Filled 2017-06-02: qty 5

## 2017-06-02 MED ORDER — FAMOTIDINE 20 MG PO TABS
20.0000 mg | ORAL_TABLET | Freq: Once | ORAL | Status: AC
  Administered 2017-06-02: 20 mg via ORAL
  Filled 2017-06-02: qty 1

## 2017-06-02 MED ORDER — GI COCKTAIL ~~LOC~~
30.0000 mL | Freq: Once | ORAL | Status: AC
  Administered 2017-06-02: 30 mL via ORAL
  Filled 2017-06-02: qty 30

## 2017-06-02 NOTE — Discharge Instructions (Signed)
Do not hesitate to return to the Emergency Department for any new, worsening or concerning symptoms.  ° °If you do not have a primary care doctor you can establish one at the  ° °[facility]: °[address] °[place] [address] °[phone] ° °After you establish care. Let them know you were seen in the emergency room. They must obtain records for further management.  ° ° °

## 2017-06-02 NOTE — ED Triage Notes (Signed)
Per translator, patient c/o abdominal pain, N/V, cough, lower back pain, and burning with urination x3 weeks. Seen for same at [facility] on 9/11.

## 2017-06-02 NOTE — ED Notes (Signed)
Unable to obtain IV access. PA notified and states will order PO medication and reevaluate pt.

## 2017-06-02 NOTE — ED Notes (Signed)
PA at bedside.

## 2017-06-02 NOTE — ED Notes (Signed)
This RN stuck pt x 2 for IV unsuccessfully

## 2017-06-02 NOTE — ED Provider Notes (Signed)
[place] DEPT Provider Note   CSN: [id number] Arrival date & time: 06/02/17  1303     History   Chief Complaint Chief Complaint  Patient presents with  . Cough  . Abdominal Pain    == HPI   Blood pressure (!) 177/160, pulse 87, temperature 98.5 F (36.9 C), temperature source Oral, resp. rate 18, height 5' (1.524 m), weight 83.9 kg (185 lb), last menstrual period 05/22/2017, SpO2 100 %.  Lorraine [name] is a 31 y.o. female complaining of dry cough intermittently over the last several weeks. She was seen for similar urgent care given a Z-Pak, prednisone and Tessalon with little relief. She states she developed a fever (MAXIMUM TEMPERATURE of 101) this morning. She has abdominal pain when she coughs. She's had several episodes of posttussive emesis. She states that she has chest pain and that is constant, nonpleuritic and non-exertional, she states it is retrosternal and nonradiating. She cannot describe the nature of the pain. She endorses shortness of breath and palpitations. She is not on any hormonal birth control, she denies exogenous estrogen, recent mobilizations, calf pain, leg swelling, family history of early cardiac death, cocaine or methamphetamine use. She has never been diagnosed with high blood pressure. She doesn't take any ACE inhibitor is.  Past Medical History:  Diagnosis Date  . Asthma    Inhaler used 01/02/16  . Diverticulitis   . Nausea & vomiting 04/02/2017  . Pre-diabetes   . Pyelonephritis   . Pyelonephritis     Patient Active Problem List   Diagnosis Date Noted  . Acute pancreatitis 04/02/2017  . Abdominal pain, acute, left upper quadrant 04/02/2017  . Intractable nausea and vomiting 04/02/2017  . Asthma in adult 04/02/2017  . Obesity (BMI 30.0-34.9) 04/02/2017  . Fatty infiltration of liver 04/02/2017  . Gastroesophageal reflux disease without esophagitis   . Pre-diabetes 07/15/2015  . Diarrhea   . Hypokalemia   . Bacterial vaginosis   .  Pyelonephritis 07/07/2015  . Abdominal pain, right upper quadrant   . Asthma 10/31/2014  . History of preterm delivery 10/22/2014  . Status post repeat low transverse cesarean section 09/06/2014  . Diverticulitis 09/10/2012    Past Surgical History:  Procedure Laterality Date  . CESAREAN SECTION    . CESAREAN SECTION N/A 2006  . CESAREAN SECTION N/A 09/03/2014   Procedure: CESAREAN SECTION;  Surgeon: Guss Bunde, MD;  Location: [facility];  Service: Obstetrics;  Laterality: N/A;  . CHOLECYSTECTOMY    . ESOPHAGOGASTRODUODENOSCOPY (EGD) WITH PROPOFOL Left 04/07/2017   Procedure: ESOPHAGOGASTRODUODENOSCOPY (EGD) WITH PROPOFOL;  Surgeon: Ronnette Juniper, MD;  Location: [facility];  Service: Gastroenterology;  Laterality: Left;  . TUBAL LIGATION      OB History    Gravida Para Term Preterm AB Living   5 3 1 2 2 3    SAB TAB Ectopic Multiple Live Births   2     1 3        Home Medications    Prior to Admission medications   Medication Sig Start Date End Date Taking? Authorizing Provider  guaiFENesin-codeine 100-10 MG/5ML syrup Take 5 mLs by mouth 3 (three) times daily as needed for cough. 05/28/17  Yes Barnet Glasgow, NP  azithromycin (ZITHROMAX) 250 MG tablet Take 1 tablet (250 mg total) by mouth daily. Take first 2 tablets together, then 1 every day until finished. Patient not taking: Reported on 06/02/2017 05/28/17   Barnet Glasgow, NP  benzonatate (TESSALON) 100 MG capsule Take 1 capsule (100 mg total) by  mouth every 8 (eight) hours. 06/02/17   Delaynee Alred, Elmyra Ricks, PA-C  famotidine (PEPCID) 20 MG tablet Take 1 tablet (20 mg total) by mouth 2 (two) times daily. 06/02/17   Haya Hemler, Elmyra Ricks, PA-C  potassium chloride SA (K-DUR,KLOR-CON) 20 MEQ tablet Take 1 tablet (20 mEq total) by mouth daily. 06/02/17   Vivia Rosenburg, Elmyra Ricks, PA-C  predniSONE (DELTASONE) 50 MG tablet Take 1 tablet daily with food Patient not taking: Reported on 06/02/2017 05/28/17   Barnet Glasgow, NP  promethazine  (PHENERGAN) 12.5 MG tablet Take 1 tablet (12.5 mg total) by mouth every 6 (six) hours as needed for nausea or vomiting. Patient not taking: Reported on 05/13/2017 04/27/17   Russo, [place] N, PA-C    Family History Family History  Problem Relation Age of Onset  . Hyperlipidemia Mother   . Diabetes Maternal Uncle   . Diabetes Paternal Grandmother     Social History Social History  Substance Use Topics  . Smoking status: Never Smoker  . Smokeless tobacco: Never Used  . Alcohol use No     Allergies   Patient has no known allergies.   Review of Systems Review of Systems  A complete review of systems was obtained and all systems are negative except as noted in the HPI and PMH.   Physical Exam Updated Vital Signs BP (!) 132/91   Pulse 76   Temp 98.5 F (36.9 C) (Oral)   Resp (!) 26   Ht 5' (1.524 m)   Wt 83.9 kg (185 lb)   LMP 05/22/2017   SpO2 100%   BMI 36.13 kg/m   Physical Exam  Constitutional: She is oriented to person, place, and time. She appears well-developed and well-nourished. No distress.  HENT:  Head: Normocephalic and atraumatic.  Mouth/Throat: Oropharynx is clear and moist.  Eyes: Pupils are equal, round, and reactive to light. Conjunctivae and EOM are normal.  Neck: Normal range of motion. No JVD present. No tracheal deviation present.  Cardiovascular: Normal rate, regular rhythm and intact distal pulses.   Radial pulse equal bilaterally  Pulmonary/Chest: Effort normal and breath sounds normal. No stridor. No respiratory distress. She has no wheezes. She has no rales. She exhibits no tenderness.  Abdominal: Soft. She exhibits no distension and no mass. There is no tenderness. There is no rebound and no guarding.  Musculoskeletal: Normal range of motion. She exhibits no edema or tenderness.  No calf asymmetry, superficial collaterals, palpable cords, edema, Homans sign negative bilaterally.    Neurological: She is alert and oriented to person, place,  and time.  Skin: Skin is warm. She is not diaphoretic.  Psychiatric: She has a normal mood and affect.  Nursing note and vitals reviewed.    ED Treatments / Results  Labs (all labs ordered are listed, but only abnormal results are displayed) Labs Reviewed  COMPREHENSIVE METABOLIC PANEL - Abnormal; Notable for the following:       Result Value   Potassium 3.1 (*)    Glucose, Bld 128 (*)    All other components within normal limits  URINALYSIS, ROUTINE W REFLEX MICROSCOPIC - Abnormal; Notable for the following:    APPearance HAZY (*)    Ketones, ur 5 (*)    All other components within normal limits  LIPASE, BLOOD  CBC  D-DIMER, QUANTITATIVE (NOT AT [facility])  MAGNESIUM  TROPONIN I  POC URINE PREG, ED  POCT PREGNANCY, URINE    EKG  EKG Interpretation  Date/Time:  Sunday June 02 2017 19:47:04 EDT Ventricular Rate:  66 PR Interval:    QRS Duration: 98 QT Interval:  412 QTC Calculation: 432 R Axis:   76 Text Interpretation:  Sinus rhythm Low voltage, precordial leads since last tracing no significant change Confirmed by Daleen Bo [phone]) on 06/02/2017 7:57:05 PM       Radiology Dg Chest 2 View  Result Date: 06/02/2017 CLINICAL DATA:  Fever and cough EXAM: CHEST  2 VIEW COMPARISON:  None. FINDINGS: The heart size and mediastinal contours are within normal limits. Both lungs are clear. The visualized skeletal structures are unremarkable. IMPRESSION: No active cardiopulmonary disease. Electronically Signed   By: Ulyses Jarred M.D.   On: 06/02/2017 14:26    Procedures Procedures (including critical care time)  Medications Ordered in ED Medications  famotidine (PEPCID) IVPB 20 mg premix (20 mg Intravenous Not Given 06/02/17 1900)  albuterol (PROVENTIL HFA;VENTOLIN HFA) 108 (90 Base) MCG/ACT inhaler 2 puff (not administered)  chlorpheniramine-HYDROcodone (TUSSIONEX) 10-8 MG/5ML suspension 5 mL (5 mLs Oral Given 06/02/17 1755)  gi cocktail (Maalox,Lidocaine,Donnatal)  (30 mLs Oral Given 06/02/17 1755)  famotidine (PEPCID) tablet 20 mg (20 mg Oral Given 06/02/17 1912)     Initial Impression / Assessment and Plan / ED Course  I have reviewed the triage vital signs and the nursing notes.  Pertinent labs & imaging results that were available during my care of the patient were reviewed by me and considered in my medical decision making (see chart for details).    Vitals:   06/02/17 1345 06/02/17 1649 06/02/17 1903 06/02/17 1930  BP:  (!) 177/160 (!) 143/97 (!) 132/91  Pulse:  87 82 76  Resp:  18 (!) 26   Temp:      TempSrc:      SpO2:  100% 100% 100%  Weight: 83.9 kg (185 lb)     Height: 5' (1.524 m)       Medications  famotidine (PEPCID) IVPB 20 mg premix (20 mg Intravenous Not Given 06/02/17 1900)  albuterol (PROVENTIL HFA;VENTOLIN HFA) 108 (90 Base) MCG/ACT inhaler 2 puff (not administered)  chlorpheniramine-HYDROcodone (TUSSIONEX) 10-8 MG/5ML suspension 5 mL (5 mLs Oral Given 06/02/17 1755)  gi cocktail (Maalox,Lidocaine,Donnatal) (30 mLs Oral Given 06/02/17 1755)  famotidine (PEPCID) tablet 20 mg (20 mg Oral Given 06/02/17 1912)    Lorraine [name] is 31 y.o. female presenting with Cough, shortness of breath, chest pain which is nonpleuritic. She has abdominal pain that is exacerbated with coughing. Lung sounds clear, she saturating perfectly on room air. She is not a smoker. She doesn't take any ACE inhibitor is her arms. Her blood pressure today is elevated. This management consulted to help her obtain primary care. Dimer negative, EKG with no acute findings, troponin negative, chest x-rays without infiltrate  Urinalysis is without signs of infection, repeat abdominal exam is benign.  I think her cough may be secondary to acid reflux, will start her on Pepcid, case management consulted to try to help this patient find primary care.  Evaluation does not show pathology that would require ongoing emergent intervention or inpatient treatment. Pt is  hemodynamically stable and mentating appropriately. Discussed findings and plan with patient/guardian, who agrees with care plan. All questions answered. Return precautions discussed and outpatient follow up given.      Final Clinical Impressions(s) / ED Diagnoses   Final diagnoses:  Cough  Atypical chest pain  Abdominal wall pain  Hypokalemia    New Prescriptions New Prescriptions   BENZONATATE (TESSALON) 100 MG CAPSULE    Take 1 capsule (  100 mg total) by mouth every 8 (eight) hours.   FAMOTIDINE (PEPCID) 20 MG TABLET    Take 1 tablet (20 mg total) by mouth 2 (two) times daily.   POTASSIUM CHLORIDE SA (K-DUR,KLOR-CON) 20 MEQ TABLET    Take 1 tablet (20 mEq total) by mouth daily.     Waynetta Pean 06/02/17 Erasmo Downer, MD 06/05/17 [id number]

## 2017-06-03 NOTE — Care Management Note (Signed)
Case Management Note  CM was consulted for no pcp and no ins.  CM noted pt was spanish speaking only.  Attempted to get pt an appointment at the [facility] but they do not have any available at this time.  Sheral Flow, CM at that clinic that pt needed a follow up appointment.  [place] would have to use an interpretor for pt and pt does not live in a zip code accepted by the [facility].  Opal Sidles, CM will follow up when one is available.  No further ED CM needs noted at this time.

## 2017-06-12 DIAGNOSIS — J45909 Unspecified asthma, uncomplicated: Secondary | ICD-10-CM | POA: Insufficient documentation

## 2017-06-12 DIAGNOSIS — R1013 Epigastric pain: Secondary | ICD-10-CM | POA: Insufficient documentation

## 2017-06-13 ENCOUNTER — Inpatient Hospital Stay: Payer: Self-pay

## 2017-06-13 ENCOUNTER — Emergency Department (HOSPITAL_COMMUNITY)
Admission: EM | Admit: 2017-06-13 | Discharge: 2017-06-13 | Disposition: A | Discharge status: Home / Self Care | Payer: Self-pay | Attending: Emergency Medicine | Admitting: Emergency Medicine

## 2017-06-13 ENCOUNTER — Encounter (HOSPITAL_COMMUNITY): Payer: Self-pay | Admitting: *Deleted

## 2017-06-13 DIAGNOSIS — R109 Unspecified abdominal pain: Secondary | ICD-10-CM

## 2017-06-13 LAB — COMPREHENSIVE METABOLIC PANEL
ALBUMIN: 4.3 g/dL (ref 3.5–5.0)
ALT: 21 U/L (ref 14–54)
ANION GAP: 8 (ref 5–15)
AST: 20 U/L (ref 15–41)
Alkaline Phosphatase: 89 U/L (ref 38–126)
BUN: 20 mg/dL (ref 6–20)
CHLORIDE: 108 mmol/L (ref 101–111)
CO2: 22 mmol/L (ref 22–32)
Calcium: 8.9 mg/dL (ref 8.9–10.3)
Creatinine, Ser: 0.95 mg/dL (ref 0.44–1.00)
GFR calc Af Amer: >60 mL/min (ref >60)
GFR calc non Af Amer: >60 mL/min (ref >60)
GLUCOSE: 88 mg/dL (ref 65–99)
POTASSIUM: 3.7 mmol/L (ref 3.5–5.1)
SODIUM: 138 mmol/L (ref 135–145)
Total Bilirubin: 0.4 mg/dL (ref 0.3–1.2)
Total Protein: 7.5 g/dL (ref 6.5–8.1)

## 2017-06-13 LAB — CBC
HEMATOCRIT: 36.9 % (ref 36.0–46.0)
HEMOGLOBIN: 12.5 g/dL (ref 12.0–15.0)
MCH: 30.4 pg (ref 26.0–34.0)
MCHC: 33.9 g/dL (ref 30.0–36.0)
MCV: 89.8 fL (ref 78.0–100.0)
Platelets: 259 10*3/uL (ref 150–400)
RBC: 4.11 MIL/uL (ref 3.87–5.11)
RDW: 13.1 % (ref 11.5–15.5)
WBC: 13.6 10*3/uL — ABNORMAL HIGH (ref 4.0–10.5)

## 2017-06-13 LAB — URINALYSIS, ROUTINE W REFLEX MICROSCOPIC
BILIRUBIN URINE: NEGATIVE
GLUCOSE, UA: NEGATIVE mg/dL
Hgb urine dipstick: NEGATIVE
Ketones, ur: NEGATIVE mg/dL
Leukocytes, UA: NEGATIVE
Nitrite: NEGATIVE
PH: 5 (ref 5.0–8.0)
Protein, ur: NEGATIVE mg/dL
SPECIFIC GRAVITY, URINE: 1.031 — AB (ref 1.005–1.030)

## 2017-06-13 LAB — LIPASE, BLOOD: Lipase: 26 U/L (ref 11–51)

## 2017-06-13 MED ORDER — PANTOPRAZOLE SODIUM 40 MG PO TBEC: 40.0000 mg | DELAYED_RELEASE_TABLET | Freq: Every day | ORAL | 0 refills | Status: DC

## 2017-06-13 MED ORDER — METOCLOPRAMIDE HCL 10 MG PO TABS: 10.0000 mg | ORAL_TABLET | Freq: Four times a day (QID) | ORAL | 0 refills | Status: DC | PRN

## 2017-06-13 MED ORDER — METOCLOPRAMIDE HCL 10 MG PO TABS
10.0000 mg | ORAL_TABLET | Freq: Once | ORAL | Status: AC
  Administered 2017-06-13: 10 mg via ORAL
  Filled 2017-06-13: qty 1

## 2017-06-13 MED ORDER — ONDANSETRON 4 MG PO TBDP
4.0000 mg | ORAL_TABLET | Freq: Once | ORAL | Status: AC | PRN
  Administered 2017-06-13: 4 mg via ORAL
  Filled 2017-06-13: qty 1

## 2017-06-13 MED ORDER — OXYCODONE-ACETAMINOPHEN 5-325 MG PO TABS
1.0000 | ORAL_TABLET | ORAL | Status: DC | PRN
  Administered 2017-06-13: 1 via ORAL
  Filled 2017-06-13: qty 1

## 2017-06-13 MED ORDER — MORPHINE SULFATE (PF) 4 MG/ML IV SOLN
4.0000 mg | Freq: Once | INTRAVENOUS | Status: AC
  Administered 2017-06-13: 4 mg via INTRAMUSCULAR
  Filled 2017-06-13: qty 1

## 2017-06-13 MED ORDER — GI COCKTAIL ~~LOC~~
30.0000 mL | Freq: Once | ORAL | Status: AC
  Administered 2017-06-13: 30 mL via ORAL
  Filled 2017-06-13: qty 30

## 2017-06-13 MED ORDER — PANTOPRAZOLE SODIUM 40 MG PO TBEC
40.0000 mg | DELAYED_RELEASE_TABLET | Freq: Once | ORAL | Status: AC
  Administered 2017-06-13: 40 mg via ORAL
  Filled 2017-06-13: qty 1

## 2017-06-13 MED ORDER — ONDANSETRON 8 MG PO TBDP
8.0000 mg | ORAL_TABLET | Freq: Once | ORAL | Status: AC
  Administered 2017-06-13: 8 mg via ORAL
  Filled 2017-06-13: qty 1

## 2017-06-13 MED ORDER — METOCLOPRAMIDE HCL 5 MG/ML IJ SOLN
10.0000 mg | Freq: Once | INTRAMUSCULAR | Status: DC
  Filled 2017-06-13: qty 2

## 2017-06-13 NOTE — ED Notes (Signed)
I attempted twice to collect labs and was unsuccessful 

## 2017-06-13 NOTE — ED Triage Notes (Signed)
Pt reports upper abd pain since yesterday with n/v.  Pt has hx of pancreatitis.

## 2017-06-13 NOTE — ED Provider Notes (Signed)
[facility] DEPT Provider Note   CSN: [id number] Arrival date & time: 06/12/17  2139     History   Chief Complaint Chief Complaint  Patient presents with  . Abdominal Pain    HPI Lorraine [name] is a 31 y.o. female.  The history is provided by the patient.  Abdominal Pain    She complains of epigastric pain for the last 2 days. There is no radiation of pain. Has been nausea and vomiting. Pain is slightly better following emesis. She denies constipation or diarrhea. She took ibuprofen at home with slight relief.  Past Medical History:  Diagnosis Date  . Asthma    Inhaler used 01/02/16  . Diverticulitis   . Nausea & vomiting 04/02/2017  . Pre-diabetes   . Pyelonephritis   . Pyelonephritis     Patient Active Problem List   Diagnosis Date Noted  . Acute pancreatitis 04/02/2017  . Abdominal pain, acute, left upper quadrant 04/02/2017  . Intractable nausea and vomiting 04/02/2017  . Asthma in adult 04/02/2017  . Obesity (BMI 30.0-34.9) 04/02/2017  . Fatty infiltration of liver 04/02/2017  . Gastroesophageal reflux disease without esophagitis   . Pre-diabetes 07/15/2015  . Diarrhea   . Hypokalemia   . Bacterial vaginosis   . Pyelonephritis 07/07/2015  . Abdominal pain, right upper quadrant   . Asthma 10/31/2014  . History of preterm delivery 10/22/2014  . Status post repeat low transverse cesarean section 09/06/2014  . Diverticulitis 09/10/2012    Past Surgical History:  Procedure Laterality Date  . CESAREAN SECTION    . CESAREAN SECTION N/A 2006  . CESAREAN SECTION N/A 09/03/2014   Procedure: CESAREAN SECTION;  Surgeon: Guss Bunde, MD;  Location: [facility];  Service: Obstetrics;  Laterality: N/A;  . CHOLECYSTECTOMY    . ESOPHAGOGASTRODUODENOSCOPY (EGD) WITH PROPOFOL Left 04/07/2017   Procedure: ESOPHAGOGASTRODUODENOSCOPY (EGD) WITH PROPOFOL;  Surgeon: Ronnette Juniper, MD;  Location: [facility];  Service: Gastroenterology;  Laterality: Left;  . TUBAL  LIGATION      OB History    Gravida Para Term Preterm AB Living   5 3 1 2 2 3    SAB TAB Ectopic Multiple Live Births   2     1 3        Home Medications    Prior to Admission medications   Medication Sig Start Date End Date Taking? Authorizing Provider  benzonatate (TESSALON) 100 MG capsule Take 1 capsule (100 mg total) by mouth every 8 (eight) hours. 06/02/17  Yes Pisciotta, Elmyra Ricks, PA-C  famotidine (PEPCID) 20 MG tablet Take 1 tablet (20 mg total) by mouth 2 (two) times daily. 06/02/17  Yes Pisciotta, Elmyra Ricks, PA-C  guaiFENesin-codeine 100-10 MG/5ML syrup Take 5 mLs by mouth 3 (three) times daily as needed for cough. 05/28/17  Yes Barnet Glasgow, NP  ibuprofen (ADVIL,MOTRIN) 200 MG tablet Take 600 mg by mouth every 6 (six) hours as needed for moderate pain.   Yes [provider]  potassium chloride SA (K-DUR,KLOR-CON) 20 MEQ tablet Take 1 tablet (20 mEq total) by mouth daily. 06/02/17  Yes Pisciotta, Elmyra Ricks, PA-C  azithromycin (ZITHROMAX) 250 MG tablet Take 1 tablet (250 mg total) by mouth daily. Take first 2 tablets together, then 1 every day until finished. Patient not taking: Reported on 06/02/2017 05/28/17   Barnet Glasgow, NP  predniSONE (DELTASONE) 50 MG tablet Take 1 tablet daily with food Patient not taking: Reported on 06/02/2017 05/28/17   Barnet Glasgow, NP  promethazine (PHENERGAN) 12.5 MG tablet Take 1 tablet (12.5  mg total) by mouth every 6 (six) hours as needed for nausea or vomiting. Patient not taking: Reported on 05/13/2017 04/27/17   Russo, [place] N, PA-C    Family History Family History  Problem Relation Age of Onset  . Hyperlipidemia Mother   . Diabetes Maternal Uncle   . Diabetes Paternal Grandmother     Social History Social History  Substance Use Topics  . Smoking status: Never Smoker  . Smokeless tobacco: Never Used  . Alcohol use No     Allergies   Patient has no known allergies.   Review of Systems Review of Systems    Gastrointestinal: Positive for abdominal pain.  All other systems reviewed and are negative.    Physical Exam Updated Vital Signs BP (!) 133/91 (BP Location: Left Arm)   Pulse 88   Temp 98.7 F (37.1 C) (Oral)   Resp 18   Wt 85.7 kg (189 lb)   LMP 05/22/2017   SpO2 99%   BMI 36.91 kg/m   Physical Exam  Nursing note and vitals reviewed.  31 year old female, resting comfortably and in no acute distress. Vital signs are significant for borderline hypertension. Oxygen saturation is 99%, which is normal. Head is normocephalic and atraumatic. PERRLA, EOMI. Oropharynx is clear. Neck is nontender and supple without adenopathy or JVD. Back is nontender and there is no CVA tenderness. Lungs are clear without rales, wheezes, or rhonchi. Chest is nontender. Heart has regular rate and rhythm without murmur. Abdomen is soft, flat, with mild epigastric tenderness. There is no rebound or guarding. There are no masses or hepatosplenomegaly and peristalsis is hypoactive. Extremities have no cyanosis or edema, full range of motion is present. Skin is warm and dry without rash. Neurologic: Mental status is normal, cranial nerves are intact, there are no motor or sensory deficits.   ED Treatments / Results  Labs (all labs ordered are listed, but only abnormal results are displayed) Labs Reviewed  CBC - Abnormal; Notable for the following:       Result Value   WBC 13.6 (*)    All other components within normal limits  URINALYSIS, ROUTINE W REFLEX MICROSCOPIC - Abnormal; Notable for the following:    Specific Gravity, Urine 1.031 (*)    All other components within normal limits  LIPASE, BLOOD  COMPREHENSIVE METABOLIC PANEL    Procedures Procedures (including critical care time)  Medications Ordered in ED Medications  oxyCODONE-acetaminophen (PERCOCET/ROXICET) 5-325 MG per tablet 1 tablet (1 tablet Oral Given 06/13/17 0139)  ondansetron (ZOFRAN-ODT) disintegrating tablet 4 mg (4 mg  Oral Given 06/13/17 0139)     Initial Impression / Assessment and Plan / ED Course  I have reviewed the triage vital signs and the nursing notes.  Pertinent labs & imaging results that were available during my care of the patient were reviewed by me and considered in my medical decision making (see chart for details).  Epigastric pain of uncertain cause. Old records are reviewed, and she has been seen numerous times in the ED for abdominal pain. She had been seen 6 days ago at the emergency department had [facility] and had negative CT of abdomen and pelvis at that time. She has had 13 CT scans of abdomen and pelvis over the past 5 years. Abdomen is benign today. WBC is mildly elevated, but remainder of labs are normal. I do not see any indications for repeat imaging. She'll be given a trial of GI cocktail. Given ondansetron for  nausea.  She had slight relief of pain with GI cocktail. Further review of past records shows that she did have an esophagogastroduodenoscopy done 2 months ago showing generalized erythema of the gastric and duodenal mucosa. She is sad discharged with prescriptions for pantoprazole and metoclopramide. Referred to gastroenterology for follow-up.  Final Clinical Impressions(s) / ED Diagnoses   Final diagnoses:  Abdominal pain, unspecified abdominal location    New Prescriptions New Prescriptions   METOCLOPRAMIDE (REGLAN) 10 MG TABLET    Take 1 tablet (10 mg total) by mouth every 6 (six) hours as needed for nausea (or headache).   PANTOPRAZOLE (PROTONIX) 40 MG TABLET    Take 1 tablet (40 mg total) by mouth daily.     Delora Fuel, MD 72/76/18 [phone]

## 2017-06-13 NOTE — ED Notes (Signed)
PT STATES DIFFICULT IV STICK. ASSESSED FOR SITE. DISCUSSED WITH PT ABOUT ULTRASOUND IV FOR ACCESS.

## 2017-06-17 ENCOUNTER — Inpatient Hospital Stay: Payer: Self-pay

## 2017-08-01 ENCOUNTER — Emergency Department (HOSPITAL_COMMUNITY)
Admission: EM | Admit: 2017-08-01 | Discharge: 2017-08-01 | Disposition: A | Discharge status: Home / Self Care | Payer: Medicaid Other | Attending: Emergency Medicine | Admitting: Emergency Medicine

## 2017-08-01 ENCOUNTER — Other Ambulatory Visit: Payer: Self-pay

## 2017-08-01 ENCOUNTER — Emergency Department (HOSPITAL_COMMUNITY): Payer: Medicaid Other

## 2017-08-01 ENCOUNTER — Encounter (HOSPITAL_COMMUNITY): Payer: Self-pay | Admitting: *Deleted

## 2017-08-01 DIAGNOSIS — R51 Headache: Secondary | ICD-10-CM | POA: Insufficient documentation

## 2017-08-01 DIAGNOSIS — L03211 Cellulitis of face: Secondary | ICD-10-CM | POA: Insufficient documentation

## 2017-08-01 DIAGNOSIS — J45909 Unspecified asthma, uncomplicated: Secondary | ICD-10-CM | POA: Insufficient documentation

## 2017-08-01 DIAGNOSIS — Z79899 Other long term (current) drug therapy: Secondary | ICD-10-CM | POA: Insufficient documentation

## 2017-08-01 MED ORDER — HYDROCODONE-ACETAMINOPHEN 5-325 MG PO TABS
2.0000 | ORAL_TABLET | Freq: Once | ORAL | Status: AC
  Administered 2017-08-01: 2 via ORAL
  Filled 2017-08-01: qty 2

## 2017-08-01 MED ORDER — HYDROCODONE-ACETAMINOPHEN 5-325 MG PO TABS: 2.0000 | ORAL_TABLET | ORAL | 0 refills | Status: DC | PRN

## 2017-08-01 MED ORDER — TOBRAMYCIN 0.3 % OP SOLN: 2.0000 [drp] | OPHTHALMIC | 0 refills | Status: DC

## 2017-08-01 MED ORDER — SULFAMETHOXAZOLE-TRIMETHOPRIM 800-160 MG PO TABS: 1.0000 | ORAL_TABLET | Freq: Two times a day (BID) | ORAL | 0 refills | Status: AC

## 2017-08-01 MED ORDER — IOPAMIDOL (ISOVUE-300) INJECTION 61%
INTRAVENOUS | Status: AC
  Filled 2017-08-01: qty 75

## 2017-08-01 NOTE — ED Notes (Signed)
Patient transported to CT 

## 2017-08-01 NOTE — Discharge Instructions (Signed)
Recheck at Urgent care tomorrow

## 2017-08-01 NOTE — ED Provider Notes (Signed)
[facility] EMERGENCY DEPARTMENT Provider Note   CSN: [id number] Arrival date & time: 08/01/17  1423     History   Chief Complaint No chief complaint on file.   HPI Lorraine [name] is a 31 y.o. female.  The history is provided by the patient. No language interpreter was used.    Past Medical History:  Diagnosis Date  . Asthma    Inhaler used 01/02/16  . Diverticulitis   . Nausea & vomiting 04/02/2017  . Pre-diabetes   . Pyelonephritis   . Pyelonephritis     Patient Active Problem List   Diagnosis Date Noted  . Acute pancreatitis 04/02/2017  . Abdominal pain, acute, left upper quadrant 04/02/2017  . Intractable nausea and vomiting 04/02/2017  . Asthma in adult 04/02/2017  . Obesity (BMI 30.0-34.9) 04/02/2017  . Fatty infiltration of liver 04/02/2017  . Gastroesophageal reflux disease without esophagitis   . Pre-diabetes 07/15/2015  . Diarrhea   . Hypokalemia   . Bacterial vaginosis   . Pyelonephritis 07/07/2015  . Abdominal pain, right upper quadrant   . Asthma 10/31/2014  . History of preterm delivery 10/22/2014  . Status post repeat low transverse cesarean section 09/06/2014  . Diverticulitis 09/10/2012    Past Surgical History:  Procedure Laterality Date  . CESAREAN SECTION    . CESAREAN SECTION N/A 2006  . CESAREAN SECTION N/A 09/03/2014   Procedure: CESAREAN SECTION;  Surgeon: Guss Bunde, MD;  Location: [facility];  Service: Obstetrics;  Laterality: N/A;  . CHOLECYSTECTOMY    . ESOPHAGOGASTRODUODENOSCOPY (EGD) WITH PROPOFOL Left 04/07/2017   Procedure: ESOPHAGOGASTRODUODENOSCOPY (EGD) WITH PROPOFOL;  Surgeon: Ronnette Juniper, MD;  Location: [facility];  Service: Gastroenterology;  Laterality: Left;  . TUBAL LIGATION      OB History    Gravida Para Term Preterm AB Living   5 3 1 2 2 3    SAB TAB Ectopic Multiple Live Births   2     1 3        Home Medications    Prior to Admission medications   Medication Sig Start Date  End Date Taking? Authorizing Provider  benzonatate (TESSALON) 100 MG capsule Take 1 capsule (100 mg total) by mouth every 8 (eight) hours. 06/02/17   Pisciotta, Elmyra Ricks, PA-C  famotidine (PEPCID) 20 MG tablet Take 1 tablet (20 mg total) by mouth 2 (two) times daily. 06/02/17   Pisciotta, Elmyra Ricks, PA-C  guaiFENesin-codeine 100-10 MG/5ML syrup Take 5 mLs by mouth 3 (three) times daily as needed for cough. 05/28/17   Barnet Glasgow, NP  ibuprofen (ADVIL,MOTRIN) 200 MG tablet Take 600 mg by mouth every 6 (six) hours as needed for moderate pain.    [provider]  metoCLOPramide (REGLAN) 10 MG tablet Take 1 tablet (10 mg total) by mouth every 6 (six) hours as needed for nausea (or headache). 1/69/67   Delora Fuel, MD  pantoprazole (PROTONIX) 40 MG tablet Take 1 tablet (40 mg total) by mouth daily. 8/93/81   Delora Fuel, MD  potassium chloride SA (K-DUR,KLOR-CON) 20 MEQ tablet Take 1 tablet (20 mEq total) by mouth daily. 06/02/17   Pisciotta, Elmyra Ricks, PA-C    Family History Family History  Problem Relation Age of Onset  . Hyperlipidemia Mother   . Diabetes Maternal Uncle   . Diabetes Paternal Grandmother     Social History Social History   Tobacco Use  . Smoking status: Never Smoker  . Smokeless tobacco: Never Used  Substance Use Topics  . Alcohol use: No  .  Drug use: No     Allergies   Patient has no known allergies.   Review of Systems Review of Systems  All other systems reviewed and are negative.    Physical Exam Updated Vital Signs BP 139/88 (BP Location: Right Arm)   Pulse 89   Temp 98.4 F (36.9 C) (Oral)   Resp 18   LMP 07/22/2017 (Exact Date)   SpO2 100%   Physical Exam  Constitutional: She appears well-developed and well-nourished.  HENT:  Head: Normocephalic and atraumatic.  Right Ear: External ear normal.  Swollen right face, swelling below right eyelid,  Conjunctiva injected,  Pain to palpation  Below eye  Eyes: Conjunctivae and EOM are normal.  Pupils are equal, round, and reactive to light.  Neck: Normal range of motion. Neck supple.  Cardiovascular: Normal rate and regular rhythm.  Pulmonary/Chest: Effort normal.  Abdominal: Soft.  Musculoskeletal: Normal range of motion.  Neurological: She is alert.  Skin: Skin is warm.  Psychiatric: She has a normal mood and affect.  Nursing note and vitals reviewed.    ED Treatments / Results  Labs (all labs ordered are listed, but only abnormal results are displayed) Labs Reviewed - No data to display  EKG  EKG Interpretation None       Radiology Ct Head Wo Contrast  Result Date: 08/01/2017 CLINICAL DATA:  Right-sided headache. Right-sided face swelling and pain. Right eyelid swelling. EXAM: CT HEAD WITHOUT CONTRAST CT MAXILLOFACIAL WITH CONTRAST TECHNIQUE: Multidetector CT imaging of the head was performed using the standard protocol without IV contrast. CT imaging of the maxillofacial structures was performed with intravenous contrast. Multiplanar CT image reconstructions were also generated. CONTRAST:  75 cc Isovue 300 intravenous COMPARISON:  Head CT 02/05/2017 FINDINGS: CT HEAD FINDINGS Brain: No evidence of acute infarction, hemorrhage, hydrocephalus, extra-axial collection or mass lesion/mass effect. Vascular: No hyperdense vessel or unexpected calcification. Skull: Normal. Negative for fracture or focal lesion. CT MAXILLOFACIAL Osseous: No fracture or erosion.  Vital appearance of the teeth. Orbits: Subtle right preseptal swelling, most convincing on sagittal reformats. No postseptal inflammation. The globes, extraocular muscles, lacrimal glands, and optic nerves have a normal appearance. Sinuses: No sinusitis. Soft tissues: Preseptal swelling on the right as above. Otherwise negative soft tissues. No abscess. IMPRESSION: 1. Subtle right preseptal swelling. No postseptal inflammation or abscess. 2. Negative intracranial imaging. Electronically Signed   By: Monte Fantasia M.D.    On: 08/01/2017 16:09   Ct Maxillofacial W Contrast  Result Date: 08/01/2017 CLINICAL DATA:  Right-sided headache. Right-sided face swelling and pain. Right eyelid swelling. EXAM: CT HEAD WITHOUT CONTRAST CT MAXILLOFACIAL WITH CONTRAST TECHNIQUE: Multidetector CT imaging of the head was performed using the standard protocol without IV contrast. CT imaging of the maxillofacial structures was performed with intravenous contrast. Multiplanar CT image reconstructions were also generated. CONTRAST:  75 cc Isovue 300 intravenous COMPARISON:  Head CT 02/05/2017 FINDINGS: CT HEAD FINDINGS Brain: No evidence of acute infarction, hemorrhage, hydrocephalus, extra-axial collection or mass lesion/mass effect. Vascular: No hyperdense vessel or unexpected calcification. Skull: Normal. Negative for fracture or focal lesion. CT MAXILLOFACIAL Osseous: No fracture or erosion.  Vital appearance of the teeth. Orbits: Subtle right preseptal swelling, most convincing on sagittal reformats. No postseptal inflammation. The globes, extraocular muscles, lacrimal glands, and optic nerves have a normal appearance. Sinuses: No sinusitis. Soft tissues: Preseptal swelling on the right as above. Otherwise negative soft tissues. No abscess. IMPRESSION: 1. Subtle right preseptal swelling. No postseptal inflammation or abscess. 2. Negative intracranial  imaging. Electronically Signed   By: Monte Fantasia M.D.   On: 08/01/2017 16:09    Procedures Procedures (including critical care time)  Medications Ordered in ED Medications  iopamidol (ISOVUE-300) 61 % injection (not administered)  HYDROcodone-acetaminophen (NORCO/VICODIN) 5-325 MG per tablet 2 tablet (2 tablets Oral Given 08/01/17 1602)     Initial Impression / Assessment and Plan / ED Course  I have reviewed the triage vital signs and the nursing notes.  Pertinent labs & imaging results that were available during my care of the patient were reviewed by me and considered in my  medical decision making (see chart for details).     Ct no abscess.   Pt counseled on infection.   I advised follow up at Urgent care for recheck.    Final Clinical Impressions(s) / ED Diagnoses   Final diagnoses:  Cellulitis, face    ED Discharge Orders        Ordered    HYDROcodone-acetaminophen (NORCO/VICODIN) 5-325 MG tablet  Every 4 hours PRN     08/01/17 1712    tobramycin (TOBREX) 0.3 % ophthalmic solution  Every 4 hours     08/01/17 1712    sulfamethoxazole-trimethoprim (BACTRIM DS,SEPTRA DS) 800-160 MG tablet  2 times daily     08/01/17 1712    An After Visit Summary was printed and given to the patient.    Fransico Meadow, [place] 08/01/17 1910    Tanna Furry, MD 08/14/17 [phone]

## 2017-08-01 NOTE — ED Triage Notes (Addendum)
[place] [phone]: Pt speaks spanish and per interpretor patient is here for early am patient was having right sided headache and woke up with swelling and pain.  Pt has swelling to right eyelid. Pt states pain to touch head area.  Asking PA to speak with patient while interpreter on phone.

## 2017-08-01 NOTE — ED Notes (Signed)
Translator used Lonna Duval - [id number]

## 2017-08-01 NOTE — ED Notes (Signed)
Translator - Susana # [id number] used to go over medication

## 2017-08-26 ENCOUNTER — Other Ambulatory Visit: Payer: Self-pay

## 2017-08-26 ENCOUNTER — Emergency Department (HOSPITAL_COMMUNITY)
Admission: EM | Admit: 2017-08-26 | Discharge: 2017-08-26 | Disposition: A | Discharge status: Home / Self Care | Payer: Medicaid Other | Attending: Emergency Medicine | Admitting: Emergency Medicine

## 2017-08-26 ENCOUNTER — Encounter (HOSPITAL_COMMUNITY): Payer: Self-pay

## 2017-08-26 DIAGNOSIS — R197 Diarrhea, unspecified: Secondary | ICD-10-CM | POA: Insufficient documentation

## 2017-08-26 DIAGNOSIS — J45909 Unspecified asthma, uncomplicated: Secondary | ICD-10-CM | POA: Insufficient documentation

## 2017-08-26 DIAGNOSIS — R112 Nausea with vomiting, unspecified: Secondary | ICD-10-CM | POA: Insufficient documentation

## 2017-08-26 DIAGNOSIS — R110 Nausea: Secondary | ICD-10-CM

## 2017-08-26 DIAGNOSIS — R1010 Upper abdominal pain, unspecified: Secondary | ICD-10-CM | POA: Insufficient documentation

## 2017-08-26 LAB — COMPREHENSIVE METABOLIC PANEL
ALT: 38 U/L (ref 14–54)
ANION GAP: 8 (ref 5–15)
AST: 27 U/L (ref 15–41)
Albumin: 4.3 g/dL (ref 3.5–5.0)
Alkaline Phosphatase: 103 U/L (ref 38–126)
BUN: 14 mg/dL (ref 6–20)
CHLORIDE: 109 mmol/L (ref 101–111)
CO2: 23 mmol/L (ref 22–32)
Calcium: 9.1 mg/dL (ref 8.9–10.3)
Creatinine, Ser: 0.77 mg/dL (ref 0.44–1.00)
GFR calc Af Amer: >60 mL/min (ref >60)
Glucose, Bld: 96 mg/dL (ref 65–99)
POTASSIUM: 3.3 mmol/L — AB (ref 3.5–5.1)
Sodium: 140 mmol/L (ref 135–145)
Total Bilirubin: 0.2 mg/dL — ABNORMAL LOW (ref 0.3–1.2)
Total Protein: 7.7 g/dL (ref 6.5–8.1)

## 2017-08-26 LAB — URINALYSIS, ROUTINE W REFLEX MICROSCOPIC
Bilirubin Urine: NEGATIVE
GLUCOSE, UA: NEGATIVE mg/dL
Hgb urine dipstick: NEGATIVE
Ketones, ur: NEGATIVE mg/dL
LEUKOCYTES UA: NEGATIVE
NITRITE: NEGATIVE
PH: 6 (ref 5.0–8.0)
Protein, ur: NEGATIVE mg/dL
SPECIFIC GRAVITY, URINE: 1.012 (ref 1.005–1.030)

## 2017-08-26 LAB — CBC
HEMATOCRIT: 38.6 % (ref 36.0–46.0)
HEMOGLOBIN: 13 g/dL (ref 12.0–15.0)
MCH: 30.2 pg (ref 26.0–34.0)
MCHC: 33.7 g/dL (ref 30.0–36.0)
MCV: 89.6 fL (ref 78.0–100.0)
Platelets: 271 10*3/uL (ref 150–400)
RBC: 4.31 MIL/uL (ref 3.87–5.11)
RDW: 12.3 % (ref 11.5–15.5)
WBC: 10 10*3/uL (ref 4.0–10.5)

## 2017-08-26 LAB — LIPASE, BLOOD: LIPASE: 31 U/L (ref 11–51)

## 2017-08-26 LAB — I-STAT BETA HCG BLOOD, ED (MC, WL, AP ONLY): I-stat hCG, quantitative: <5 m[IU]/mL (ref <5)

## 2017-08-26 MED ORDER — SODIUM CHLORIDE 0.9 % IV BOLUS (SEPSIS)
1000.0000 mL | Freq: Once | INTRAVENOUS | Status: AC
  Administered 2017-08-26: 1000 mL via INTRAVENOUS

## 2017-08-26 MED ORDER — ONDANSETRON HCL 4 MG PO TABS: 4.0000 mg | ORAL_TABLET | Freq: Four times a day (QID) | ORAL | 0 refills | Status: DC

## 2017-08-26 MED ORDER — MORPHINE SULFATE (PF) 4 MG/ML IV SOLN
4.0000 mg | Freq: Once | INTRAVENOUS | Status: AC
  Administered 2017-08-26: 4 mg via INTRAVENOUS
  Filled 2017-08-26: qty 1

## 2017-08-26 MED ORDER — HALOPERIDOL LACTATE 5 MG/ML IJ SOLN
5.0000 mg | Freq: Once | INTRAMUSCULAR | Status: AC
  Administered 2017-08-26: 5 mg via INTRAVENOUS
  Filled 2017-08-26: qty 1

## 2017-08-26 MED ORDER — ONDANSETRON HCL 4 MG/2ML IJ SOLN
4.0000 mg | Freq: Once | INTRAMUSCULAR | Status: AC
  Administered 2017-08-26: 4 mg via INTRAVENOUS
  Filled 2017-08-26: qty 2

## 2017-08-26 MED ORDER — GI COCKTAIL ~~LOC~~
30.0000 mL | Freq: Once | ORAL | Status: AC
  Administered 2017-08-26: 30 mL via ORAL
  Filled 2017-08-26: qty 30

## 2017-08-26 MED ORDER — METOCLOPRAMIDE HCL 5 MG/ML IJ SOLN
10.0000 mg | Freq: Once | INTRAMUSCULAR | Status: AC
  Administered 2017-08-26: 10 mg via INTRAVENOUS
  Filled 2017-08-26: qty 2

## 2017-08-26 MED ORDER — ACETAMINOPHEN 325 MG PO TABS: 650.0000 mg | ORAL_TABLET | Freq: Four times a day (QID) | ORAL | 0 refills | Status: DC | PRN

## 2017-08-26 MED ORDER — KETOROLAC TROMETHAMINE 15 MG/ML IJ SOLN
15.0000 mg | Freq: Once | INTRAMUSCULAR | Status: AC
  Administered 2017-08-26: 15 mg via INTRAVENOUS
  Filled 2017-08-26: qty 1

## 2017-08-26 NOTE — ED Provider Notes (Signed)
[facility] DEPT Provider Note   CSN: [id number] Arrival date & time: 08/26/17  1655     History   Chief Complaint Chief Complaint  Patient presents with  . Abdominal Pain  . Nausea  . Diarrhea    HPI Lorraine [name] is a 31 y.o. female presenting with abdominal pain.  Patient states that for the past 2 weeks, she has had persistent abdominal pain.  It is of the upper abdomen starting in the left radiating to the right.  This is worse immediately after she eats or drinks.  She states that she has decreased her oral intake significantly due to this pain.  She has not taken anything for pain including Tylenol or ibuprofen, nothing makes it better.  She reports constant nausea, and intermittent vomiting.  She reports frequent bowel movements, up to 10 times a day.  She denies blood in her stool.  She reports urinary frequency and dysuria, no hematuria.  She denies fevers, chills, chest pain, shortness of breath.  She denies sick contacts.  She denies recent travel or abnormal foods.  She has a history of cholecystectomy.  She saw a doctor with a equal GI several months ago and had a normal EGD.  She had abdominal pain at that time, but improved without intervention.  She is not taking anything for heartburn.  She was on Bentyl for a while, but is not taking that at this time.  About 2 weeks ago, she finished a course of Bactrim.  HPI  Past Medical History:  Diagnosis Date  . Asthma    Inhaler used 01/02/16  . Diverticulitis   . Nausea & vomiting 04/02/2017  . Pre-diabetes   . Pyelonephritis   . Pyelonephritis     Patient Active Problem List   Diagnosis Date Noted  . Acute pancreatitis 04/02/2017  . Abdominal pain, acute, left upper quadrant 04/02/2017  . Intractable nausea and vomiting 04/02/2017  . Asthma in adult 04/02/2017  . Obesity (BMI 30.0-34.9) 04/02/2017  . Fatty infiltration of liver 04/02/2017  . Gastroesophageal reflux disease  without esophagitis   . Pre-diabetes 07/15/2015  . Diarrhea   . Hypokalemia   . Bacterial vaginosis   . Pyelonephritis 07/07/2015  . Abdominal pain, right upper quadrant   . Asthma 10/31/2014  . History of preterm delivery 10/22/2014  . Status post repeat low transverse cesarean section 09/06/2014  . Diverticulitis 09/10/2012    Past Surgical History:  Procedure Laterality Date  . CESAREAN SECTION    . CESAREAN SECTION N/A 2006  . CESAREAN SECTION N/A 09/03/2014   Procedure: CESAREAN SECTION;  Surgeon: Guss Bunde, MD;  Location: [facility];  Service: Obstetrics;  Laterality: N/A;  . CHOLECYSTECTOMY    . ESOPHAGOGASTRODUODENOSCOPY (EGD) WITH PROPOFOL Left 04/07/2017   Procedure: ESOPHAGOGASTRODUODENOSCOPY (EGD) WITH PROPOFOL;  Surgeon: Ronnette Juniper, MD;  Location: [facility];  Service: Gastroenterology;  Laterality: Left;  . TUBAL LIGATION      OB History    Gravida Para Term Preterm AB Living   5 3 1 2 2 3    SAB TAB Ectopic Multiple Live Births   2     1 3        Home Medications    Prior to Admission medications   Medication Sig Start Date End Date Taking? Authorizing Provider  acetaminophen (TYLENOL) 325 MG tablet Take 2 tablets (650 mg total) by mouth every 6 (six) hours as needed. 08/26/17   Juvencio Verdi, PA-C  benzonatate (TESSALON) 100 MG  capsule Take 1 capsule (100 mg total) by mouth every 8 (eight) hours. Patient not taking: Reported on 08/26/2017 06/02/17   Pisciotta, Elmyra Ricks, PA-C  famotidine (PEPCID) 20 MG tablet Take 1 tablet (20 mg total) by mouth 2 (two) times daily. Patient not taking: Reported on 08/26/2017 06/02/17   Pisciotta, Elmyra Ricks, PA-C  guaiFENesin-codeine 100-10 MG/5ML syrup Take 5 mLs by mouth 3 (three) times daily as needed for cough. Patient not taking: Reported on 08/26/2017 05/28/17   Barnet Glasgow, NP  HYDROcodone-acetaminophen (NORCO/VICODIN) 5-325 MG tablet Take 2 tablets every 4 (four) hours as needed by mouth. Patient not taking:  Reported on 08/26/2017 08/01/17   Fransico Meadow, PA-C  ibuprofen (ADVIL,MOTRIN) 200 MG tablet Take 600 mg by mouth every 6 (six) hours as needed for moderate pain.    [provider]  metoCLOPramide (REGLAN) 10 MG tablet Take 1 tablet (10 mg total) by mouth every 6 (six) hours as needed for nausea (or headache). Patient not taking: Reported on 71/69/6789 3/81/01   Delora Fuel, MD  ondansetron (ZOFRAN) 4 MG tablet Take 1 tablet (4 mg total) by mouth every 6 (six) hours. 08/26/17   Titania Gault, PA-C  pantoprazole (PROTONIX) 40 MG tablet Take 1 tablet (40 mg total) by mouth daily. Patient not taking: Reported on 75/06/2584 2/77/82   Delora Fuel, MD  potassium chloride SA (K-DUR,KLOR-CON) 20 MEQ tablet Take 1 tablet (20 mEq total) by mouth daily. Patient not taking: Reported on 08/26/2017 06/02/17   Pisciotta, Elmyra Ricks, PA-C  tobramycin (TOBREX) 0.3 % ophthalmic solution Place 2 drops every 4 (four) hours into the right eye. Patient not taking: Reported on 08/26/2017 08/01/17   Sidney Ace    Family History Family History  Problem Relation Age of Onset  . Hyperlipidemia Mother   . Diabetes Maternal Uncle   . Diabetes Paternal Grandmother     Social History Social History   Tobacco Use  . Smoking status: Never Smoker  . Smokeless tobacco: Never Used  Substance Use Topics  . Alcohol use: No  . Drug use: No     Allergies   Patient has no known allergies.   Review of Systems Review of Systems  Constitutional: Positive for appetite change (due to postprandial pain).  Gastrointestinal: Positive for abdominal pain, diarrhea, nausea and vomiting (intermittent). Negative for blood in stool.  All other systems reviewed and are negative.    Physical Exam Updated Vital Signs BP 116/69 (BP Location: Left Arm)   Pulse 69   Temp 98.4 F (36.9 C) (Oral)   Resp 16   Ht 5' (1.524 m)   Wt 83.2 kg (183 lb 8 oz)   LMP 08/23/2017   SpO2 100%   BMI 35.84 kg/m     Physical Exam  Constitutional: She is oriented to person, place, and time. She appears well-developed and well-nourished. No distress.  HENT:  Head: Normocephalic and atraumatic.  Eyes: EOM are normal.  Neck: Normal range of motion.  Cardiovascular: Normal rate, regular rhythm and intact distal pulses.  Pulmonary/Chest: Effort normal and breath sounds normal. No respiratory distress. She has no wheezes.  Abdominal: Soft. Bowel sounds are normal. She exhibits no distension. There is tenderness.  Tenderness to palpation of left upper and right upper quadrants.  No tenderness palpation of epigastric.  Mild tenderness palpation of right lower quadrant and suprapubic area.  No distention, rigidity, or guarding.  Negative rebound.  Musculoskeletal: Normal range of motion.  Neurological: She is alert and oriented to person,  place, and time.  Skin: Skin is warm and dry.  Psychiatric: She has a normal mood and affect.  Nursing note and vitals reviewed.    ED Treatments / Results  Labs (all labs ordered are listed, but only abnormal results are displayed) Labs Reviewed  COMPREHENSIVE METABOLIC PANEL - Abnormal; Notable for the following components:      Result Value   Potassium 3.3 (*)    Total Bilirubin 0.2 (*)    All other components within normal limits  GASTROINTESTINAL PANEL BY PCR, STOOL (REPLACES STOOL CULTURE)  C DIFFICILE QUICK SCREEN W PCR REFLEX  LIPASE, BLOOD  CBC  URINALYSIS, ROUTINE W REFLEX MICROSCOPIC  I-STAT BETA HCG BLOOD, ED ([facility], [facility], [facility] ONLY)    EKG  EKG Interpretation None       Radiology No results found.  Procedures Procedures (including critical care time)  Medications Ordered in ED Medications  sodium chloride 0.9 % bolus 1,000 mL (0 mLs Intravenous Stopped 08/26/17 2130)  ondansetron (ZOFRAN) injection 4 mg (4 mg Intravenous Given 08/26/17 1928)  ketorolac (TORADOL) 15 MG/ML injection 15 mg (15 mg Intravenous Given 08/26/17 1930)  morphine 4  MG/ML injection 4 mg (4 mg Intravenous Given 08/26/17 2031)  metoCLOPramide (REGLAN) injection 10 mg (10 mg Intravenous Given 08/26/17 2030)  gi cocktail (Maalox,Lidocaine,Donnatal) (30 mLs Oral Given 08/26/17 2032)  haloperidol lactate (HALDOL) injection 5 mg (5 mg Intravenous Given 08/26/17 2154)     Initial Impression / Assessment and Plan / ED Course  I have reviewed the triage vital signs and the nursing notes.  Pertinent labs & imaging results that were available during my care of the patient were reviewed by me and considered in my medical decision making (see chart for details).     Patient presenting for evaluation of abdominal pain and nausea.  Physical exam reassuring, she is afebrile not tachycardic.  Will obtain basic abdominal labs, start IV fluids, ketorolac and Zofran for symptom control.  Labs reassuring, no leukocytosis.  Lipase negative.  [place], patient states nausea and pain is not improved.  Will give morphine and Reglan for symptom control.  UA pending.  I do not believe CT scan is necessary at this time, she has had this pain before with a negative CT.  At this time, doubt perforation, obstruction, infection, or surgical abdomen.  Urine negative for UTI.  Patient reports pain is improved, but nausea persists.  Will try Haldol and GI cocktail.  P.o. challenge afterwards.   Symptoms are improved.  Patient tolerated p.o. without difficulty.  Patient to follow-up with GI for further evaluation of symptoms.  At this time, patient appears safe for discharge.  Return precautions given.  Patient states she understands and agrees to plan.   Final Clinical Impressions(s) / ED Diagnoses   Final diagnoses:  Pain of upper abdomen  Nausea    ED Discharge Orders        Ordered    ondansetron (ZOFRAN) 4 MG tablet  Every 6 hours     08/26/17 2213    acetaminophen (TYLENOL) 325 MG tablet  Every 6 hours PRN     08/26/17 2213       Franchot Heidelberg, PA-C 08/26/17  2221    Tegeler, Gwenyth Allegra, MD 08/27/17 [phone]

## 2017-08-26 NOTE — ED Notes (Signed)
Pt was able to tolerate PO fluid challenge without difficulty.

## 2017-08-26 NOTE — Discharge Instructions (Signed)
Toma zofran por nausea Toma tylenol for dolor Es importante que beba agua.  Da Ardelia Mems cita con el gastroenterologo para mas evaluacion de tu sintomas.  Regresa a la sala de emergencia si tiene fiebre, vomita sin parar, Research officer, trade union, o nuevas sintomas.   Take Zofran as needed for nausea. Take Tylenol as needed for pain.  It is very important that you continue to drink water. Make an appointment with the gastroenterologist for further evaluation of your symptoms. Return to the emergency room if you develop persistent fever, persistent vomiting, worsening pain, or any new or worsening symptoms.

## 2017-08-26 NOTE — ED Triage Notes (Signed)
Patient c/o constant left mid abdominal pain, nausea, and diarrhea x 2 weeks. Patient states she has increased pain when she eats.

## 2017-09-07 ENCOUNTER — Emergency Department (HOSPITAL_COMMUNITY): Payer: Medicaid Other

## 2017-09-07 ENCOUNTER — Other Ambulatory Visit: Payer: Self-pay

## 2017-09-07 ENCOUNTER — Emergency Department (HOSPITAL_COMMUNITY)
Admission: EM | Admit: 2017-09-07 | Discharge: 2017-09-07 | Disposition: A | Discharge status: Home / Self Care | Payer: Medicaid Other | Attending: Emergency Medicine | Admitting: Emergency Medicine

## 2017-09-07 ENCOUNTER — Encounter (HOSPITAL_COMMUNITY): Payer: Self-pay | Admitting: *Deleted

## 2017-09-07 DIAGNOSIS — A084 Viral intestinal infection, unspecified: Secondary | ICD-10-CM

## 2017-09-07 DIAGNOSIS — J45909 Unspecified asthma, uncomplicated: Secondary | ICD-10-CM | POA: Insufficient documentation

## 2017-09-07 LAB — URINALYSIS, ROUTINE W REFLEX MICROSCOPIC
BACTERIA UA: NONE SEEN
BILIRUBIN URINE: NEGATIVE
Glucose, UA: NEGATIVE mg/dL
Hgb urine dipstick: NEGATIVE
KETONES UR: 5 mg/dL — AB
LEUKOCYTES UA: NEGATIVE
Nitrite: NEGATIVE
PROTEIN: NEGATIVE mg/dL
Specific Gravity, Urine: 1.018 (ref 1.005–1.030)
pH: 7 (ref 5.0–8.0)

## 2017-09-07 LAB — CBC
HEMATOCRIT: 37.2 % (ref 36.0–46.0)
HEMOGLOBIN: 12.8 g/dL (ref 12.0–15.0)
MCH: 30.5 pg (ref 26.0–34.0)
MCHC: 34.4 g/dL (ref 30.0–36.0)
MCV: 88.6 fL (ref 78.0–100.0)
Platelets: 312 10*3/uL (ref 150–400)
RBC: 4.2 MIL/uL (ref 3.87–5.11)
RDW: 12.8 % (ref 11.5–15.5)
WBC: 12.3 10*3/uL — ABNORMAL HIGH (ref 4.0–10.5)

## 2017-09-07 LAB — COMPREHENSIVE METABOLIC PANEL
ALT: 28 U/L (ref 14–54)
ANION GAP: 10 (ref 5–15)
AST: 26 U/L (ref 15–41)
Albumin: 4.2 g/dL (ref 3.5–5.0)
Alkaline Phosphatase: 97 U/L (ref 38–126)
BUN: 15 mg/dL (ref 6–20)
CHLORIDE: 106 mmol/L (ref 101–111)
CO2: 22 mmol/L (ref 22–32)
CREATININE: 0.86 mg/dL (ref 0.44–1.00)
Calcium: 9.3 mg/dL (ref 8.9–10.3)
GFR calc non Af Amer: >60 mL/min (ref >60)
Glucose, Bld: 126 mg/dL — ABNORMAL HIGH (ref 65–99)
POTASSIUM: 3.1 mmol/L — AB (ref 3.5–5.1)
SODIUM: 138 mmol/L (ref 135–145)
Total Bilirubin: 0.7 mg/dL (ref 0.3–1.2)
Total Protein: 7.3 g/dL (ref 6.5–8.1)

## 2017-09-07 LAB — LIPASE, BLOOD: LIPASE: 28 U/L (ref 11–51)

## 2017-09-07 LAB — I-STAT BETA HCG BLOOD, ED (MC, WL, AP ONLY): I-stat hCG, quantitative: <5 m[IU]/mL (ref <5)

## 2017-09-07 MED ORDER — LOPERAMIDE HCL 2 MG PO CAPS: 2.0000 mg | ORAL_CAPSULE | Freq: Four times a day (QID) | ORAL | 0 refills | Status: DC | PRN

## 2017-09-07 MED ORDER — MORPHINE SULFATE (PF) 4 MG/ML IV SOLN
4.0000 mg | Freq: Once | INTRAVENOUS | Status: AC
  Administered 2017-09-07: 4 mg via INTRAVENOUS
  Filled 2017-09-07: qty 1

## 2017-09-07 MED ORDER — ONDANSETRON HCL 4 MG/2ML IJ SOLN
4.0000 mg | Freq: Once | INTRAMUSCULAR | Status: AC
  Administered 2017-09-07: 4 mg via INTRAVENOUS
  Filled 2017-09-07: qty 2

## 2017-09-07 MED ORDER — SODIUM CHLORIDE 0.9 % IV BOLUS (SEPSIS)
1000.0000 mL | Freq: Once | INTRAVENOUS | Status: AC
  Administered 2017-09-07: 1000 mL via INTRAVENOUS

## 2017-09-07 MED ORDER — KETOROLAC TROMETHAMINE 30 MG/ML IJ SOLN
30.0000 mg | Freq: Once | INTRAMUSCULAR | Status: AC
  Administered 2017-09-07: 30 mg via INTRAVENOUS
  Filled 2017-09-07: qty 1

## 2017-09-07 MED ORDER — IOPAMIDOL (ISOVUE-300) INJECTION 61%
INTRAVENOUS | Status: AC
  Administered 2017-09-07: 100 mL
  Filled 2017-09-07: qty 100

## 2017-09-07 MED ORDER — IBUPROFEN 600 MG PO TABS: 600.0000 mg | ORAL_TABLET | Freq: Four times a day (QID) | ORAL | 0 refills | Status: DC | PRN

## 2017-09-07 MED ORDER — ONDANSETRON HCL 4 MG PO TABS: 4.0000 mg | ORAL_TABLET | Freq: Four times a day (QID) | ORAL | 0 refills | Status: DC

## 2017-09-07 NOTE — Discharge Instructions (Addendum)
Please read and follow all provided instructions.  Your diagnoses today include:  1. Viral gastroenteritis     Tests performed today include: Blood counts and electrolytes Blood tests to check liver and kidney function Blood tests to check pancreas function Urine test to look for infection and pregnancy (in women) Vital signs. See below for your results today.   Medications prescribed:   Take any prescribed medications only as directed.  Home care instructions:  Follow any educational materials contained in this packet.  Follow-up instructions: Please follow-up with your primary care provider in the next 2 days for further evaluation of your symptoms.    Return instructions:  SEEK IMMEDIATE MEDICAL ATTENTION IF: The pain does not go away or becomes severe  A temperature above 101F develops  Repeated vomiting occurs (multiple episodes)  The pain becomes localized to portions of the abdomen. The right side could possibly be appendicitis. In an adult, the left lower portion of the abdomen could be colitis or diverticulitis.  Blood is being passed in stools or vomit (bright red or black tarry stools)  You develop chest pain, difficulty breathing, dizziness or fainting, or become confused, poorly responsive, or inconsolable (young children) If you have any other emergent concerns regarding your health  Additional Information: Abdominal (belly) pain can be caused by many things. Your caregiver performed an examination and possibly ordered blood/urine tests and imaging (CT scan, x-rays, ultrasound). Many cases can be observed and treated at home after initial evaluation in the emergency department. Even though you are being discharged home, abdominal pain can be unpredictable. Therefore, you need a repeated exam if your pain does not resolve, returns, or worsens. Most patients with abdominal pain don't have to be admitted to the hospital or have surgery, but serious problems like  appendicitis and gallbladder attacks can start out as nonspecific pain. Many abdominal conditions cannot be diagnosed in one visit, so follow-up evaluations are very important.  Your vital signs today were: BP 116/72    Pulse 67    Temp 98.4 F (36.9 C) (Oral)    Resp 15    Ht 5' (1.524 m)    Wt 81.6 kg (180 lb)    LMP 08/23/2017    SpO2 100%    BMI 35.15 kg/m  If your blood pressure (bp) was elevated above 135/85 this visit, please have this repeated by your doctor within one month. --------------

## 2017-09-07 NOTE — ED Provider Notes (Signed)
[facility] EMERGENCY DEPARTMENT Provider Note   CSN: [id number] Arrival date & time: 09/07/17  0037     History   Chief Complaint Chief Complaint  Patient presents with  . Abdominal Pain    HPI Lorraine [name] is a 31 y.o. female.  HPI  31 y.o. female with a hx of Diverticulitis, Asthma, presents to the Emergency Department today due to abdominal pain with onset yesterday. Notes pain in right middle abdomen. Pain with palpation over area. No hx same. States pain constant and rates 8/10. Worse with movement and palpation. No meds PTA. Associated N/V/D. Denies melena. Denies dysuria. No vaginal bleeding/discharge. LMP December 5th. No fevers. No sick contacts. No other symptoms noted   Past Medical History:  Diagnosis Date  . Asthma    Inhaler used 01/02/16  . Diverticulitis   . Nausea & vomiting 04/02/2017  . Pre-diabetes   . Pyelonephritis   . Pyelonephritis     Patient Active Problem List   Diagnosis Date Noted  . Acute pancreatitis 04/02/2017  . Abdominal pain, acute, left upper quadrant 04/02/2017  . Intractable nausea and vomiting 04/02/2017  . Asthma in adult 04/02/2017  . Obesity (BMI 30.0-34.9) 04/02/2017  . Fatty infiltration of liver 04/02/2017  . Gastroesophageal reflux disease without esophagitis   . Pre-diabetes 07/15/2015  . Diarrhea   . Hypokalemia   . Bacterial vaginosis   . Pyelonephritis 07/07/2015  . Abdominal pain, right upper quadrant   . Asthma 10/31/2014  . History of preterm delivery 10/22/2014  . Status post repeat low transverse cesarean section 09/06/2014  . Diverticulitis 09/10/2012    Past Surgical History:  Procedure Laterality Date  . CESAREAN SECTION    . CESAREAN SECTION N/A 2006  . CESAREAN SECTION N/A 09/03/2014   Procedure: CESAREAN SECTION;  Surgeon: Guss Bunde, MD;  Location: [facility];  Service: Obstetrics;  Laterality: N/A;  . CHOLECYSTECTOMY    . ESOPHAGOGASTRODUODENOSCOPY (EGD) WITH PROPOFOL  Left 04/07/2017   Procedure: ESOPHAGOGASTRODUODENOSCOPY (EGD) WITH PROPOFOL;  Surgeon: Ronnette Juniper, MD;  Location: [facility];  Service: Gastroenterology;  Laterality: Left;  . TUBAL LIGATION      OB History    Gravida Para Term Preterm AB Living   5 3 1 2 2 3    SAB TAB Ectopic Multiple Live Births   2     1 3        Home Medications    Prior to Admission medications   Medication Sig Start Date End Date Taking? Authorizing Provider  acetaminophen (TYLENOL) 325 MG tablet Take 2 tablets (650 mg total) by mouth every 6 (six) hours as needed. 08/26/17   Caccavale, Sophia, PA-C  benzonatate (TESSALON) 100 MG capsule Take 1 capsule (100 mg total) by mouth every 8 (eight) hours. Patient not taking: Reported on 08/26/2017 06/02/17   Pisciotta, Elmyra Ricks, PA-C  famotidine (PEPCID) 20 MG tablet Take 1 tablet (20 mg total) by mouth 2 (two) times daily. Patient not taking: Reported on 08/26/2017 06/02/17   Pisciotta, Elmyra Ricks, PA-C  guaiFENesin-codeine 100-10 MG/5ML syrup Take 5 mLs by mouth 3 (three) times daily as needed for cough. Patient not taking: Reported on 08/26/2017 05/28/17   Barnet Glasgow, NP  HYDROcodone-acetaminophen (NORCO/VICODIN) 5-325 MG tablet Take 2 tablets every 4 (four) hours as needed by mouth. Patient not taking: Reported on 08/26/2017 08/01/17   Fransico Meadow, PA-C  ibuprofen (ADVIL,MOTRIN) 200 MG tablet Take 600 mg by mouth every 6 (six) hours as needed for moderate pain.  [provider]  metoCLOPramide (REGLAN) 10 MG tablet Take 1 tablet (10 mg total) by mouth every 6 (six) hours as needed for nausea (or headache). Patient not taking: Reported on 81/85/6314 9/70/26   Delora Fuel, MD  ondansetron (ZOFRAN) 4 MG tablet Take 1 tablet (4 mg total) by mouth every 6 (six) hours. 08/26/17   Caccavale, Sophia, PA-C  pantoprazole (PROTONIX) 40 MG tablet Take 1 tablet (40 mg total) by mouth daily. Patient not taking: Reported on 37/85/8850 2/77/41   Delora Fuel, MD    potassium chloride SA (K-DUR,KLOR-CON) 20 MEQ tablet Take 1 tablet (20 mEq total) by mouth daily. Patient not taking: Reported on 08/26/2017 06/02/17   Pisciotta, Elmyra Ricks, PA-C  tobramycin (TOBREX) 0.3 % ophthalmic solution Place 2 drops every 4 (four) hours into the right eye. Patient not taking: Reported on 08/26/2017 08/01/17   Sidney Ace    Family History Family History  Problem Relation Age of Onset  . Hyperlipidemia Mother   . Diabetes Maternal Uncle   . Diabetes Paternal Grandmother     Social History Social History   Tobacco Use  . Smoking status: Never Smoker  . Smokeless tobacco: Never Used  Substance Use Topics  . Alcohol use: No  . Drug use: No     Allergies   Patient has no known allergies.   Review of Systems Review of Systems ROS reviewed and all are negative for acute change except as noted in the HPI.  Physical Exam Updated Vital Signs BP 115/76   Pulse 68   Temp 98.4 F (36.9 C) (Oral)   Resp 15   Ht 5' (1.524 m)   Wt 81.6 kg (180 lb)   LMP 08/23/2017   SpO2 99%   BMI 35.15 kg/m   Physical Exam  Constitutional: She is oriented to person, place, and time. She appears well-developed and well-nourished. No distress.  HENT:  Head: Normocephalic and atraumatic.  Right Ear: Tympanic membrane, external ear and ear canal normal.  Left Ear: Tympanic membrane, external ear and ear canal normal.  Nose: Nose normal.  Mouth/Throat: Uvula is midline, oropharynx is clear and moist and mucous membranes are normal. No trismus in the jaw. No oropharyngeal exudate, posterior oropharyngeal erythema or tonsillar abscesses.  Eyes: EOM are normal. Pupils are equal, round, and reactive to light.  Neck: Normal range of motion. Neck supple. No tracheal deviation present.  Cardiovascular: Normal rate, regular rhythm, S1 normal, S2 normal, normal heart sounds, intact distal pulses and normal pulses.  Pulmonary/Chest: Effort normal and breath sounds normal.  No respiratory distress. She has no decreased breath sounds. She has no wheezes. She has no rhonchi. She has no rales.  Abdominal: Normal appearance and bowel sounds are normal. There is tenderness in the right upper quadrant and right lower quadrant. There is no rigidity, no rebound, no guarding, no CVA tenderness, no tenderness at McBurney's point and negative Murphy's sign.  Abdomen soft.  Musculoskeletal: Normal range of motion.  Neurological: She is alert and oriented to person, place, and time.  Skin: Skin is warm and dry.  Psychiatric: She has a normal mood and affect. Her speech is normal and behavior is normal. Thought content normal.  Nursing note and vitals reviewed.  ED Treatments / Results  Labs (all labs ordered are listed, but only abnormal results are displayed) Labs Reviewed  COMPREHENSIVE METABOLIC PANEL - Abnormal; Notable for the following components:      Result Value   Potassium 3.1 (*)  Glucose, Bld 126 (*)    All other components within normal limits  CBC - Abnormal; Notable for the following components:   WBC 12.3 (*)    All other components within normal limits  URINALYSIS, ROUTINE W REFLEX MICROSCOPIC - Abnormal; Notable for the following components:   Ketones, ur 5 (*)    Squamous Epithelial / LPF 0-5 (*)    All other components within normal limits  LIPASE, BLOOD  I-STAT BETA HCG BLOOD, ED ([facility], [facility], [facility] ONLY)    EKG  EKG Interpretation None       Radiology Ct Abdomen Pelvis W Contrast  Result Date: 09/07/2017 CLINICAL DATA:  Acute onset of generalized abdominal pain, nausea, vomiting and diarrhea. EXAM: CT ABDOMEN AND PELVIS WITH CONTRAST TECHNIQUE: Multidetector CT imaging of the abdomen and pelvis was performed using the standard protocol following bolus administration of intravenous contrast. CONTRAST:  128mL ISOVUE-300 IOPAMIDOL (ISOVUE-300) INJECTION 61% COMPARISON:  CT of the abdomen and pelvis from 04/23/2017 FINDINGS: Lower chest: The  visualized lung bases are grossly clear. The visualized portions of the mediastinum are unremarkable. Hepatobiliary: The liver is unremarkable in appearance. The patient is status post cholecystectomy, with clips noted at the gallbladder fossa. The common bile duct remains normal in caliber. Pancreas: The pancreas is within normal limits. Spleen: The spleen is unremarkable in appearance. Adrenals/Urinary Tract: The adrenal glands are unremarkable in appearance. The kidneys are within normal limits. There is no evidence of hydronephrosis. No renal or ureteral stones are identified. No perinephric stranding is seen. Stomach/Bowel: The stomach is unremarkable in appearance. The small bowel is within normal limits. The appendix is normal in caliber, without evidence of appendicitis. The colon is unremarkable in appearance. Vascular/Lymphatic: The abdominal aorta is unremarkable in appearance. The inferior vena cava is grossly unremarkable. No retroperitoneal lymphadenopathy is seen. No pelvic sidewall lymphadenopathy is identified. Reproductive: The bladder is mildly distended and grossly unremarkable. The uterus grossly unremarkable in appearance. The ovaries are relatively symmetric. The right-sided tubal ligation clip is again noted displaced overlying the uterine fundus. No suspicious adnexal masses are seen. Other: No additional soft tissue abnormalities are seen. Musculoskeletal: No acute osseous abnormalities are identified. The visualized musculature is unremarkable in appearance. IMPRESSION: 1. No acute abnormality seen to explain the patient's symptoms. 2. Displaced right-sided tubal ligation clip again noted. Electronically Signed   By: Garald Balding M.D.   On: 09/07/2017 05:39    Procedures Procedures (including critical care time)  Medications Ordered in ED Medications  sodium chloride 0.9 % bolus 1,000 mL (1,000 mLs Intravenous New Bag/Given 09/07/17 0503)  morphine 4 MG/ML injection 4 mg (4 mg  Intravenous Given 09/07/17 0503)  ondansetron (ZOFRAN) injection 4 mg (4 mg Intravenous Given 09/07/17 0503)  iopamidol (ISOVUE-300) 61 % injection (100 mLs  Contrast Given 09/07/17 0516)     Initial Impression / Assessment and Plan / ED Course  I have reviewed the triage vital signs and the nursing notes.  Pertinent labs & imaging results that were available during my care of the patient were reviewed by me and considered in my medical decision making (see chart for details).  Final Clinical Impressions(s) / ED Diagnoses  {I have reviewed and evaluated the relevant laboratory values. {I have reviewed and evaluated the relevant imaging studies.  {I have reviewed the relevant previous healthcare records.  {I obtained HPI from historian.   ED Course:  Assessment: Pt is a 30 y.o. female with a hx of Diverticulitis, Asthma, presents to the Emergency  Department today due to abdominal pain with onset yesterday. Notes pain in right middle abdomen. Pain with palpation over area. No hx same. States pain constant and rates 8/10. Worse with movement and palpation. No meds PTA. Associated N/V/D. Denies melena. Denies dysuria. No vaginal bleeding/discharge. LMP December 5th. No fevers. No sick contacts. On exam, pt in NAD. Nontoxic/nonseptic appearing. VSS. Afebrile. Lungs CTA. Heart RRR. Abdomen TTP RLQ/RUQ. CBC with mild leukocytosis. CMP unremarkable. Mild hypokalemia. Given Potassium. Pregnancy negative. Concern due to diffuse tenderness on right abdomen. Possible appendicitis. Given analgesia and fluids in ED.  CT abdomen/Pelvis unremarkable. Suspect viral gastroenteritis. Plan is to DC home with close follow up to PCP. Strict return precautions given. At time of discharge, Patient is in no acute distress. Vital Signs are stable. Patient is able to ambulate. Patient able to tolerate PO.   Disposition/Plan:  DC Home Additional Verbal discharge instructions given and discussed with patient.  Pt Instructed  to f/u with PCP in the next week for evaluation and treatment of symptoms. Return precautions given Pt acknowledges and agrees with plan  Supervising Physician Delora Fuel, MD  Final diagnoses:  Viral gastroenteritis    ED Discharge Orders    None       Shary Decamp, PA-C 74/71/85 [id number]    Delora Fuel, MD 86/82/57 [phone]

## 2017-09-07 NOTE — ED Triage Notes (Signed)
The pt is c/o abd pain just today  With n v and diarrhea  lmp  Dec 5th

## 2017-09-27 ENCOUNTER — Other Ambulatory Visit: Payer: Self-pay

## 2017-09-27 ENCOUNTER — Encounter (HOSPITAL_COMMUNITY): Payer: Self-pay | Admitting: Emergency Medicine

## 2017-09-27 ENCOUNTER — Ambulatory Visit (HOSPITAL_COMMUNITY)
Admission: EM | Admit: 2017-09-27 | Discharge: 2017-09-27 | Disposition: A | Discharge status: Home / Self Care | Payer: Medicaid Other | Attending: Internal Medicine | Admitting: Internal Medicine

## 2017-09-27 DIAGNOSIS — R319 Hematuria, unspecified: Secondary | ICD-10-CM | POA: Diagnosis not present

## 2017-09-27 DIAGNOSIS — Z3202 Encounter for pregnancy test, result negative: Secondary | ICD-10-CM | POA: Diagnosis not present

## 2017-09-27 DIAGNOSIS — M545 Low back pain, unspecified: Secondary | ICD-10-CM

## 2017-09-27 LAB — POCT URINALYSIS DIP (DEVICE)
Bilirubin Urine: NEGATIVE
Glucose, UA: NEGATIVE mg/dL
Ketones, ur: NEGATIVE mg/dL
LEUKOCYTES UA: NEGATIVE
NITRITE: NEGATIVE
PH: 7 (ref 5.0–8.0)
PROTEIN: NEGATIVE mg/dL
SPECIFIC GRAVITY, URINE: 1.015 (ref 1.005–1.030)
Urobilinogen, UA: 0.2 mg/dL (ref 0.0–1.0)

## 2017-09-27 LAB — POCT PREGNANCY, URINE: Preg Test, Ur: NEGATIVE

## 2017-09-27 MED ORDER — DICLOFENAC SODIUM 75 MG PO TBEC: 75.0000 mg | DELAYED_RELEASE_TABLET | Freq: Two times a day (BID) | ORAL | 0 refills | Status: DC

## 2017-09-27 MED ORDER — METHOCARBAMOL 500 MG PO TABS: 500.0000 mg | ORAL_TABLET | Freq: Two times a day (BID) | ORAL | 0 refills | Status: DC

## 2017-09-27 NOTE — ED Triage Notes (Signed)
The patient presented to the [facility] with a complaint of lower right back pain and hematuria x 1 week.

## 2017-09-27 NOTE — Discharge Instructions (Signed)
Return if any problems.  See your Physician for recheck  °

## 2017-09-29 NOTE — ED Provider Notes (Signed)
[facility]    CSN: [id number] Arrival date & time: 09/27/17  [place]     History   Chief Complaint Chief Complaint  Patient presents with  . Back Pain  . Hematuria    HPI Lorraine [name] is a 32 y.o. female.   The history is provided by the patient. No language interpreter was used.  Back Pain  Location:  Lumbar spine Quality:  Aching Radiates to:  Does not radiate Pain severity:  Moderate Onset quality:  Gradual Timing:  Constant Progression:  Worsening Chronicity:  New Context: not emotional stress   Relieved by:  Nothing Worsened by:  Nothing Ineffective treatments:  None tried Associated symptoms: no fever   Hematuria     Past Medical History:  Diagnosis Date  . Asthma    Inhaler used 01/02/16  . Diverticulitis   . Nausea & vomiting 04/02/2017  . Pre-diabetes   . Pyelonephritis   . Pyelonephritis     Patient Active Problem List   Diagnosis Date Noted  . Acute pancreatitis 04/02/2017  . Abdominal pain, acute, left upper quadrant 04/02/2017  . Intractable nausea and vomiting 04/02/2017  . Asthma in adult 04/02/2017  . Obesity (BMI 30.0-34.9) 04/02/2017  . Fatty infiltration of liver 04/02/2017  . Gastroesophageal reflux disease without esophagitis   . Pre-diabetes 07/15/2015  . Diarrhea   . Hypokalemia   . Bacterial vaginosis   . Pyelonephritis 07/07/2015  . Abdominal pain, right upper quadrant   . Asthma 10/31/2014  . History of preterm delivery 10/22/2014  . Status post repeat low transverse cesarean section 09/06/2014  . Diverticulitis 09/10/2012    Past Surgical History:  Procedure Laterality Date  . CESAREAN SECTION    . CESAREAN SECTION N/A 2006  . CESAREAN SECTION N/A 09/03/2014   Procedure: CESAREAN SECTION;  Surgeon: Guss Bunde, MD;  Location: [facility];  Service: Obstetrics;  Laterality: N/A;  . CHOLECYSTECTOMY    . ESOPHAGOGASTRODUODENOSCOPY (EGD) WITH PROPOFOL Left 04/07/2017   Procedure:  ESOPHAGOGASTRODUODENOSCOPY (EGD) WITH PROPOFOL;  Surgeon: Ronnette Juniper, MD;  Location: [facility];  Service: Gastroenterology;  Laterality: Left;  . TUBAL LIGATION      OB History    Gravida Para Term Preterm AB Living   5 3 1 2 2 3    SAB TAB Ectopic Multiple Live Births   2     1 3        Home Medications    Prior to Admission medications   Medication Sig Start Date End Date Taking? Authorizing Provider  diclofenac (VOLTAREN) 75 MG EC tablet Take 1 tablet (75 mg total) by mouth 2 (two) times daily. 09/27/17   Fransico Meadow, PA-C  methocarbamol (ROBAXIN) 500 MG tablet Take 1 tablet (500 mg total) by mouth 2 (two) times daily. 09/27/17   Fransico Meadow, PA-C    Family History Family History  Problem Relation Age of Onset  . Hyperlipidemia Mother   . Diabetes Maternal Uncle   . Diabetes Paternal Grandmother     Social History Social History   Tobacco Use  . Smoking status: Never Smoker  . Smokeless tobacco: Never Used  Substance Use Topics  . Alcohol use: No  . Drug use: No     Allergies   Patient has no known allergies.   Review of Systems Review of Systems  Constitutional: Negative for fever.  Genitourinary: Positive for hematuria.  Musculoskeletal: Positive for back pain.  All other systems reviewed and are negative.    Physical Exam  Triage Vital Signs ED Triage Vitals  Enc Vitals Group     BP 09/27/17 1920 (!) 143/92     Pulse Rate 09/27/17 1920 81     Resp 09/27/17 1920 18     Temp 09/27/17 1920 98.3 F (36.8 C)     Temp Source 09/27/17 1920 Oral     SpO2 09/27/17 1920 100 %     Weight --      Height --      Head Circumference --      Peak Flow --      Pain Score 09/27/17 1919 5     Pain Loc --      Pain Edu? --      Excl. in [place]? --    No data found.  Updated Vital Signs BP (!) 143/92 (BP Location: Left Arm)   Pulse 81   Temp 98.3 F (36.8 C) (Oral)   Resp 18   LMP 09/16/2017 (Exact Date)   SpO2 100%   Visual Acuity Right Eye  Distance:   Left Eye Distance:   Bilateral Distance:    Right Eye Near:   Left Eye Near:    Bilateral Near:     Physical Exam  Constitutional: She appears well-developed and well-nourished.  HENT:  Head: Normocephalic.  Nose: Nose normal.  Mouth/Throat: Oropharynx is clear and moist.  Cardiovascular: Normal rate.  Pulmonary/Chest: Effort normal.  Musculoskeletal: She exhibits tenderness.  Neurological: She is alert.  Skin: Skin is warm.  Psychiatric: She has a normal mood and affect.  Vitals reviewed.    UC Treatments / Results  Labs (all labs ordered are listed, but only abnormal results are displayed) Labs Reviewed  POCT URINALYSIS DIP (DEVICE) - Abnormal; Notable for the following components:      Result Value   Hgb urine dipstick TRACE (*)    All other components within normal limits  POCT PREGNANCY, URINE    EKG  EKG Interpretation None       Radiology No results found.  Procedures Procedures (including critical care time)  Medications Ordered in UC Medications - No data to display   Initial Impression / Assessment and Plan / UC Course  I have reviewed the triage vital signs and the nursing notes.  Pertinent labs & imaging results that were available during my care of the patient were reviewed by me and considered in my medical decision making (see chart for details).     Pt does not have a uti.  Pt had a ct scan a month ago that did not show any kidney stones.  I suspect painis muscular  Final Clinical Impressions(s) / UC Diagnoses   Final diagnoses:  Right-sided low back pain without sciatica, unspecified chronicity    ED Discharge Orders        Ordered    diclofenac (VOLTAREN) 75 MG EC tablet  2 times daily     09/27/17 2011    methocarbamol (ROBAXIN) 500 MG tablet  2 times daily     09/27/17 2011     An After Visit Summary was printed and given to the patient.   Controlled Substance Prescriptions [place] Controlled Substance Registry  consulted? Not Applicable   Fransico Meadow, [place] 09/29/17 1357

## 2017-10-10 ENCOUNTER — Inpatient Hospital Stay (HOSPITAL_COMMUNITY)
Admission: AD | Admit: 2017-10-10 | Discharge: 2017-10-10 | Disposition: A | Discharge status: Home / Self Care | Payer: Medicaid Other | Source: Ambulatory Visit | Attending: Obstetrics and Gynecology | Admitting: Obstetrics and Gynecology

## 2017-10-10 ENCOUNTER — Encounter (HOSPITAL_COMMUNITY): Payer: Self-pay | Admitting: *Deleted

## 2017-10-10 DIAGNOSIS — B9689 Other specified bacterial agents as the cause of diseases classified elsewhere: Secondary | ICD-10-CM | POA: Diagnosis not present

## 2017-10-10 DIAGNOSIS — Z3202 Encounter for pregnancy test, result negative: Secondary | ICD-10-CM | POA: Insufficient documentation

## 2017-10-10 DIAGNOSIS — R1032 Left lower quadrant pain: Secondary | ICD-10-CM

## 2017-10-10 DIAGNOSIS — N760 Acute vaginitis: Secondary | ICD-10-CM

## 2017-10-10 HISTORY — DX: Acute pancreatitis without necrosis or infection, unspecified: K85.90

## 2017-10-10 LAB — CBC WITH DIFFERENTIAL/PLATELET
Basophils Absolute: 0 10*3/uL (ref 0.0–0.1)
Basophils Relative: 0 %
EOS ABS: 0.2 10*3/uL (ref 0.0–0.7)
EOS PCT: 2 %
HCT: 39.1 % (ref 36.0–46.0)
Hemoglobin: 13.5 g/dL (ref 12.0–15.0)
LYMPHS ABS: 3.3 10*3/uL (ref 0.7–4.0)
Lymphocytes Relative: 36 %
MCH: 31 pg (ref 26.0–34.0)
MCHC: 34.5 g/dL (ref 30.0–36.0)
MCV: 89.9 fL (ref 78.0–100.0)
MONOS PCT: 3 %
Monocytes Absolute: 0.3 10*3/uL (ref 0.1–1.0)
Neutro Abs: 5.2 10*3/uL (ref 1.7–7.7)
Neutrophils Relative %: 59 %
PLATELETS: 267 10*3/uL (ref 150–400)
RBC: 4.35 MIL/uL (ref 3.87–5.11)
RDW: 12.9 % (ref 11.5–15.5)
WBC: 9 10*3/uL (ref 4.0–10.5)

## 2017-10-10 LAB — WET PREP, GENITAL
Sperm: NONE SEEN
TRICH WET PREP: NONE SEEN
Yeast Wet Prep HPF POC: NONE SEEN

## 2017-10-10 LAB — URINALYSIS, ROUTINE W REFLEX MICROSCOPIC
Bilirubin Urine: NEGATIVE
GLUCOSE, UA: NEGATIVE mg/dL
Hgb urine dipstick: NEGATIVE
Ketones, ur: NEGATIVE mg/dL
Leukocytes, UA: NEGATIVE
Nitrite: NEGATIVE
PROTEIN: NEGATIVE mg/dL
Specific Gravity, Urine: 1.015 (ref 1.005–1.030)
pH: 7 (ref 5.0–8.0)

## 2017-10-10 LAB — POCT PREGNANCY, URINE: Preg Test, Ur: NEGATIVE

## 2017-10-10 MED ORDER — IBUPROFEN 800 MG PO TABS
800.0000 mg | ORAL_TABLET | Freq: Once | ORAL | Status: AC
  Administered 2017-10-10: 800 mg via ORAL
  Filled 2017-10-10: qty 1

## 2017-10-10 MED ORDER — IBUPROFEN 600 MG PO TABS: 600.0000 mg | ORAL_TABLET | Freq: Four times a day (QID) | ORAL | 0 refills | Status: DC | PRN

## 2017-10-10 MED ORDER — METRONIDAZOLE 500 MG PO TABS: 500.0000 mg | ORAL_TABLET | Freq: Two times a day (BID) | ORAL | 0 refills | Status: DC

## 2017-10-10 NOTE — Discharge Instructions (Signed)
Dolor abdominal en los adultos Abdominal Pain, Adult El dolor de [place] (abdominal) puede tener muchas causas. [place] veces, el dolor de [place] no es peligroso. Muchos de [employer] de dolor de estmago pueden controlarse y tratarse en casa. Sin embargo, a Clinical cytogeneticist, Conservation officer, historic buildings de [employer] puede ser grave. El mdico intentar descubrir la causa del dolor de [place]. Siga estas indicaciones en su casa:  Tome los medicamentos de venta libre y los recetados solamente como se lo haya indicado el mdico. No tome medicamentos que lo ayuden a Landscape architect (laxantes), salvo que el mdico se lo indique.  Beba suficiente lquido para mantener el pis (orina) claro o de color amarillo plido.  Est atento al dolor de estmago para Actuary cambio.  Concurra a todas las visitas de control como se lo haya indicado el mdico. Esto es importante. Comunquese con un mdico si:  El dolor de estmago cambia o [place].  No tiene apetito o baja de peso sin proponrselo.  Tiene dificultades para defecar (est estreido) o heces lquidas (diarrea) durante ms de 2 o 3das.  Siente dolor al orinar o defecar.  El dolor de estmago lo despierta de noche.  El dolor empeora con las comidas, despus de comer o con determinados alimentos.  Vomita y no puede retener nada de lo que ingiere.  Tiene fiebre. Solicite ayuda de inmediato si:  El dolor no desaparece en el tiempo indicado por el mdico.  No puede detener los vmitos.  Siente dolor solamente en zonas especficas del abdomen, como el lado derecho o la parte inferior izquierda.  Tiene heces con sangre, de color negro o con aspecto alquitranado.  Tiene dolor muy intenso en el vientre, clicos o meteorismo.  Presenta signos de no tener suficientes lquidos o agua en el cuerpo (deshidratacin), por ejemplo: ? [place] es [place], es muy escasa o no orina. ? Labios agrietados. ? [employer]. ? Ojos  hundidos. ? Somnolencia. ? Debilidad. Esta informacin no tiene Marine scientist el consejo del mdico. Asegrese de hacerle al mdico cualquier pregunta que tenga. Document Released: 11/30/2008 Document Revised: 11/28/2016 Document Reviewed: 02/15/2016 Elsevier Interactive Patient Education  2018 [place] bacteriana (Bacterial Vaginosis) La vaginosis bacteriana es una infeccin de la vagina. Se produce cuando crece una cantidad excesiva de grmenes normales (bacterias sanas) en la vagina. Esta infeccin aumenta el riesgo de contraer otras infecciones de transmisin sexual. El tratamiento de esta infeccin puede ayudar a reducir el riesgo de otras infecciones, como:  Clamidia.  Roderick Pee.  VIH.  Herpes. [place] los medicamentos tal como se lo indic su mdico.  Finalice la prescripcin completa, aunque comience a sentirse mejor.  Comunique a sus compaeros sexuales que sufre una infeccin. Deben consultar a su mdico para iniciar un tratamiento.  Durante el tratamiento: ? Teacher, music o use preservativos de Cabin crew. ? No se haga duchas vaginales. ? No consuma alcohol a menos que el mdico lo autorice. ? No amamante a menos que el mdico la autorice.  SOLICITE AYUDA SI:  No mejora luego de 3 das de tratamiento.  Observa una secrecin (prdida) de color gris ms abundante que proviene de la vagina.  Siente ms dolor que antes.  Tiene fiebre.  ASEGRESE DE QUE:  Comprende estas instrucciones.  Controlar su afeccin.  Recibir ayuda de inmediato si no mejora o si empeora.  Esta informacin no tiene Marine scientist el consejo del mdico. Asegrese de  hacerle al mdico cualquier pregunta que tenga. Document Released: 11/30/2008 Document Revised: 12/26/2015 Document Reviewed: 04/15/2013 Elsevier Interactive Patient Education  2017 [employer].

## 2017-10-10 NOTE — MAU Provider Note (Signed)
History     CSN: [id number]  Arrival date and time: 10/10/17 6144   First Provider Initiated Contact with Patient 10/10/17 1935      Chief Complaint  Patient presents with  . Abdominal Pain  . Possible Pregnancy   HPI  [name] is a 32 y.o. [id number] who presents to MAU today with complaint of LLQ abdominal pain x 1 week. She rates pain at 5/10 now. She has not taken any pain medication. She denies vaginal bleeding, discharge, UTI symptoms, constipation, vomiting or fever. She has had some nausea. She states pain with straining.   OB History    Gravida Para Term Preterm AB Living   5 3 1 2 2 3    SAB TAB Ectopic Multiple Live Births   2     1 3       Past Medical History:  Diagnosis Date  . Asthma    Inhaler used 01/02/16  . Diverticulitis   . Nausea & vomiting 04/02/2017  . Pancreatitis   . Pre-diabetes   . Pyelonephritis   . Pyelonephritis     Past Surgical History:  Procedure Laterality Date  . CESAREAN SECTION    . CESAREAN SECTION N/A 2006  . CESAREAN SECTION N/A 09/03/2014   Procedure: CESAREAN SECTION;  Surgeon: Guss Bunde, MD;  Location: [facility];  Service: Obstetrics;  Laterality: N/A;  . CHOLECYSTECTOMY    . ESOPHAGOGASTRODUODENOSCOPY (EGD) WITH PROPOFOL Left 04/07/2017   Procedure: ESOPHAGOGASTRODUODENOSCOPY (EGD) WITH PROPOFOL;  Surgeon: Ronnette Juniper, MD;  Location: [facility];  Service: Gastroenterology;  Laterality: Left;  . TUBAL LIGATION      Family History  Problem Relation Age of Onset  . Hyperlipidemia Mother   . Diabetes Maternal Uncle   . Diabetes Paternal Grandmother     Social History   Tobacco Use  . Smoking status: Never Smoker  . Smokeless tobacco: Never Used  Substance Use Topics  . Alcohol use: No  . Drug use: No    Allergies: No Known Allergies  Medications Prior to Admission  Medication Sig Dispense Refill Last Dose  . diclofenac (VOLTAREN) 75 MG EC tablet Take 1 tablet (75 mg total) by mouth 2 (two) times  daily. 20 tablet 0 Unknown at Unknown time  . methocarbamol (ROBAXIN) 500 MG tablet Take 1 tablet (500 mg total) by mouth 2 (two) times daily. 20 tablet 0 Unknown at Unknown time    Review of Systems  Constitutional: Negative for fever.  Gastrointestinal: Positive for abdominal pain and nausea. Negative for constipation, diarrhea and vomiting.  Genitourinary: Negative for dysuria, frequency, urgency, vaginal bleeding and vaginal discharge.   Physical Exam   Blood pressure (!) 148/60, pulse 75, temperature 98 F (36.7 C), temperature source Oral, resp. rate 15, height 5' 0.25" (1.53 m), weight 186 lb (84.4 kg), last menstrual period 09/16/2017, SpO2 100 %.  Physical Exam  Nursing note and vitals reviewed. Constitutional: She is oriented to person, place, and time. She appears well-developed and well-nourished. No distress.  HENT:  Head: Normocephalic and atraumatic.  Cardiovascular: Normal rate.  Respiratory: Effort normal.  GI: Soft. She exhibits no distension and no mass. There is tenderness (mild LLQ abdominal tenderness to palpation). There is no rebound and no guarding.  Neurological: She is alert and oriented to person, place, and time.  Skin: Skin is warm and dry. No erythema.  Psychiatric: She has a normal mood and affect.    Results for orders placed or performed during the hospital encounter of  10/10/17 (from the past 24 hour(s))  Urinalysis, Routine w reflex microscopic     Status: Abnormal   Collection Time: 10/10/17  6:55 PM  Result Value Ref Range   Color, Urine YELLOW YELLOW   APPearance HAZY (A) CLEAR   Specific Gravity, Urine 1.015 1.005 - 1.030   pH 7.0 5.0 - 8.0   Glucose, UA NEGATIVE NEGATIVE mg/dL   Hgb urine dipstick NEGATIVE NEGATIVE   Bilirubin Urine NEGATIVE NEGATIVE   Ketones, ur NEGATIVE NEGATIVE mg/dL   Protein, ur NEGATIVE NEGATIVE mg/dL   Nitrite NEGATIVE NEGATIVE   Leukocytes, UA NEGATIVE NEGATIVE  Pregnancy, urine POC     Status: None    Collection Time: 10/10/17  7:01 PM  Result Value Ref Range   Preg Test, Ur NEGATIVE NEGATIVE  CBC with Differential/Platelet     Status: None   Collection Time: 10/10/17  7:44 PM  Result Value Ref Range   WBC 9.0 4.0 - 10.5 K/uL   RBC 4.35 3.87 - 5.11 MIL/uL   Hemoglobin 13.5 12.0 - 15.0 g/dL   HCT 39.1 36.0 - 46.0 %   MCV 89.9 78.0 - 100.0 fL   MCH 31.0 26.0 - 34.0 pg   MCHC 34.5 30.0 - 36.0 g/dL   RDW 12.9 11.5 - 15.5 %   Platelets 267 150 - 400 K/uL   Neutrophils Relative % 59 %   Neutro Abs 5.2 1.7 - 7.7 K/uL   Lymphocytes Relative 36 %   Lymphs Abs 3.3 0.7 - 4.0 K/uL   Monocytes Relative 3 %   Monocytes Absolute 0.3 0.1 - 1.0 K/uL   Eosinophils Relative 2 %   Eosinophils Absolute 0.2 0.0 - 0.7 K/uL   Basophils Relative 0 %   Basophils Absolute 0.0 0.0 - 0.1 K/uL  Wet prep, genital     Status: Abnormal   Collection Time: 10/10/17  8:10 PM  Result Value Ref Range   Yeast Wet Prep HPF POC NONE SEEN NONE SEEN   Trich, Wet Prep NONE SEEN NONE SEEN   Clue Cells Wet Prep HPF POC PRESENT (A) NONE SEEN   WBC, Wet Prep HPF POC FEW (A) NONE SEEN   Sperm NONE SEEN     MAU Course  Procedures None  MDM UPT - negative UA, wet prep, GC/Chlamydia and CBC today  Assessment and Plan  A: LLQ abdominal pain Bacterial vaginosis   P: Discharge home Rx for Flagyl and Ibuprofen given to patient  Warning signs for worsening condition discussed Patient advised to follow-up with [facility] if symptoms persist or worsen Patient may return to MAU as needed or if her condition were to change or worsen  Kerry Hough, PA-C 10/10/2017, 8:58 PM

## 2017-10-11 ENCOUNTER — Encounter (HOSPITAL_COMMUNITY): Payer: Self-pay | Admitting: Emergency Medicine

## 2017-10-11 ENCOUNTER — Emergency Department (HOSPITAL_COMMUNITY)
Admission: EM | Admit: 2017-10-11 | Discharge: 2017-10-12 | Disposition: A | Discharge status: Home / Self Care | Payer: Medicaid Other | Attending: Emergency Medicine | Admitting: Emergency Medicine

## 2017-10-11 DIAGNOSIS — R109 Unspecified abdominal pain: Secondary | ICD-10-CM | POA: Insufficient documentation

## 2017-10-11 DIAGNOSIS — Z5321 Procedure and treatment not carried out due to patient leaving prior to being seen by health care provider: Secondary | ICD-10-CM | POA: Insufficient documentation

## 2017-10-11 LAB — GC/CHLAMYDIA PROBE AMP (~~LOC~~) NOT AT ARMC
Chlamydia: NEGATIVE
NEISSERIA GONORRHEA: NEGATIVE

## 2017-10-11 LAB — RPR: RPR: NONREACTIVE

## 2017-10-11 LAB — HIV ANTIBODY (ROUTINE TESTING W REFLEX): HIV SCREEN 4TH GENERATION: NONREACTIVE

## 2017-10-11 NOTE — ED Notes (Signed)
Pt called x2 for recheck vs, no response from lobby

## 2017-10-11 NOTE — ED Triage Notes (Signed)
Patient c/o left side abd pain x 5 days with nausea and pressure when urinating.

## 2017-10-11 NOTE — ED Notes (Signed)
Blood draw x 2 unsuccessful 

## 2017-10-11 NOTE — ED Notes (Signed)
Pt called for recheck vs, no response from the lobby

## 2017-10-12 NOTE — ED Notes (Signed)
Called patient to bed patient and no answer.

## 2017-10-13 ENCOUNTER — Encounter (HOSPITAL_COMMUNITY): Payer: Self-pay | Admitting: *Deleted

## 2017-10-13 ENCOUNTER — Other Ambulatory Visit: Payer: Self-pay

## 2017-10-13 ENCOUNTER — Emergency Department (HOSPITAL_COMMUNITY)
Admission: EM | Admit: 2017-10-13 | Discharge: 2017-10-14 | Disposition: A | Discharge status: Home / Self Care | Payer: Medicaid Other | Attending: Emergency Medicine | Admitting: Emergency Medicine

## 2017-10-13 ENCOUNTER — Emergency Department (HOSPITAL_COMMUNITY): Payer: Medicaid Other

## 2017-10-13 DIAGNOSIS — R1032 Left lower quadrant pain: Secondary | ICD-10-CM | POA: Insufficient documentation

## 2017-10-13 DIAGNOSIS — J45909 Unspecified asthma, uncomplicated: Secondary | ICD-10-CM | POA: Insufficient documentation

## 2017-10-13 DIAGNOSIS — N83202 Unspecified ovarian cyst, left side: Secondary | ICD-10-CM

## 2017-10-13 DIAGNOSIS — B9689 Other specified bacterial agents as the cause of diseases classified elsewhere: Secondary | ICD-10-CM

## 2017-10-13 DIAGNOSIS — N760 Acute vaginitis: Secondary | ICD-10-CM

## 2017-10-13 DIAGNOSIS — N83292 Other ovarian cyst, left side: Secondary | ICD-10-CM | POA: Insufficient documentation

## 2017-10-13 LAB — COMPREHENSIVE METABOLIC PANEL
ALK PHOS: 93 U/L (ref 38–126)
ALT: 24 U/L (ref 14–54)
ANION GAP: 11 (ref 5–15)
AST: 26 U/L (ref 15–41)
Albumin: 4 g/dL (ref 3.5–5.0)
BILIRUBIN TOTAL: 0.1 mg/dL — AB (ref 0.3–1.2)
BUN: 14 mg/dL (ref 6–20)
CALCIUM: 9.1 mg/dL (ref 8.9–10.3)
CO2: 21 mmol/L — ABNORMAL LOW (ref 22–32)
CREATININE: 0.98 mg/dL (ref 0.44–1.00)
Chloride: 107 mmol/L (ref 101–111)
GFR calc Af Amer: >60 mL/min (ref >60)
GFR calc non Af Amer: >60 mL/min (ref >60)
GLUCOSE: 91 mg/dL (ref 65–99)
Potassium: 3.9 mmol/L (ref 3.5–5.1)
Sodium: 139 mmol/L (ref 135–145)
Total Protein: 7.1 g/dL (ref 6.5–8.1)

## 2017-10-13 LAB — LIPASE, BLOOD: Lipase: 30 U/L (ref 11–51)

## 2017-10-13 LAB — URINALYSIS, ROUTINE W REFLEX MICROSCOPIC
BILIRUBIN URINE: NEGATIVE
Glucose, UA: NEGATIVE mg/dL
HGB URINE DIPSTICK: NEGATIVE
KETONES UR: NEGATIVE mg/dL
Leukocytes, UA: NEGATIVE
Nitrite: NEGATIVE
PROTEIN: NEGATIVE mg/dL
SPECIFIC GRAVITY, URINE: 1.023 (ref 1.005–1.030)
pH: 7 (ref 5.0–8.0)

## 2017-10-13 LAB — I-STAT BETA HCG BLOOD, ED (MC, WL, AP ONLY)

## 2017-10-13 LAB — CBC
HCT: 38.4 % (ref 36.0–46.0)
Hemoglobin: 12.4 g/dL (ref 12.0–15.0)
MCH: 29.6 pg (ref 26.0–34.0)
MCHC: 32.3 g/dL (ref 30.0–36.0)
MCV: 91.6 fL (ref 78.0–100.0)
PLATELETS: 264 10*3/uL (ref 150–400)
RBC: 4.19 MIL/uL (ref 3.87–5.11)
RDW: 13.4 % (ref 11.5–15.5)
WBC: 8.3 10*3/uL (ref 4.0–10.5)

## 2017-10-13 LAB — WET PREP, GENITAL
Sperm: NONE SEEN
Trich, Wet Prep: NONE SEEN
Yeast Wet Prep HPF POC: NONE SEEN

## 2017-10-13 MED ORDER — METOCLOPRAMIDE HCL 5 MG/ML IJ SOLN
10.0000 mg | Freq: Once | INTRAMUSCULAR | Status: AC
  Administered 2017-10-13: 10 mg via INTRAVENOUS
  Filled 2017-10-13: qty 2

## 2017-10-13 MED ORDER — MORPHINE SULFATE (PF) 4 MG/ML IV SOLN
4.0000 mg | Freq: Once | INTRAVENOUS | Status: AC
  Administered 2017-10-13: 4 mg via INTRAVENOUS
  Filled 2017-10-13: qty 1

## 2017-10-13 NOTE — ED Provider Notes (Addendum)
[facility] EMERGENCY DEPARTMENT Provider Note   CSN: [id number] Arrival date & time: 10/13/17  1653     History   Chief Complaint Chief Complaint  Patient presents with  . Abdominal Pain    HPI Lorraine [name] is a 32 y.o. female.  History is obtained from patient using professional medical interpreter.  Patient's English is limited.  She complains of left lower quadrant pain for the past 5 days.  Pain is exacerbated by walking and improved with rest.  She denies any vaginal discharge her last normal menstrual period was September 21, 2017.  She feels a pressure in her lower abdomen which she feels interferes with her ability to urinate or have a bowel movement though she does not V her bladder completely.  She is treat herself with ibuprofen, Tylenol, and a [place] medication known as Flanex, without relief.  She vomited yesterday.  Today complains of mild nausea but no vomiting.  Ate eggs earlier today.  Patient has suffered from chronic abdominal pain, etiology unclear.  She does report history of pancreatitis had CT scan of the abdomen/pelvis on 12 22 2018 which was normal  HPI  Past Medical History:  Diagnosis Date  . Asthma    Inhaler used 01/02/16  . Diverticulitis   . Nausea & vomiting 04/02/2017  . Pancreatitis   . Pre-diabetes   . Pyelonephritis   . Pyelonephritis     Patient Active Problem List   Diagnosis Date Noted  . Acute pancreatitis 04/02/2017  . Abdominal pain, acute, left upper quadrant 04/02/2017  . Intractable nausea and vomiting 04/02/2017  . Asthma in adult 04/02/2017  . Obesity (BMI 30.0-34.9) 04/02/2017  . Fatty infiltration of liver 04/02/2017  . Gastroesophageal reflux disease without esophagitis   . Pre-diabetes 07/15/2015  . Diarrhea   . Hypokalemia   . Bacterial vaginosis   . Pyelonephritis 07/07/2015  . Abdominal pain, right upper quadrant   . Asthma 10/31/2014  . History of preterm delivery 10/22/2014  . Status post  repeat low transverse cesarean section 09/06/2014  . Diverticulitis 09/10/2012    Past Surgical History:  Procedure Laterality Date  . CESAREAN SECTION    . CESAREAN SECTION N/A 2006  . CESAREAN SECTION N/A 09/03/2014   Procedure: CESAREAN SECTION;  Surgeon: Guss Bunde, MD;  Location: [facility];  Service: Obstetrics;  Laterality: N/A;  . CHOLECYSTECTOMY    . ESOPHAGOGASTRODUODENOSCOPY (EGD) WITH PROPOFOL Left 04/07/2017   Procedure: ESOPHAGOGASTRODUODENOSCOPY (EGD) WITH PROPOFOL;  Surgeon: Ronnette Juniper, MD;  Location: [facility];  Service: Gastroenterology;  Laterality: Left;  . TUBAL LIGATION      OB History    Gravida Para Term Preterm AB Living   5 3 1 2 2 3    SAB TAB Ectopic Multiple Live Births   2     1 3        Home Medications    Prior to Admission medications   Medication Sig Start Date End Date Taking? Authorizing Provider  ibuprofen (ADVIL,MOTRIN) 600 MG tablet Take 1 tablet (600 mg total) by mouth every 6 (six) hours as needed. 10/10/17   Luvenia Redden, PA-C  metroNIDAZOLE (FLAGYL) 500 MG tablet Take 1 tablet (500 mg total) by mouth 2 (two) times daily. 10/10/17   Luvenia Redden, PA-C    Family History Family History  Problem Relation Age of Onset  . Hyperlipidemia Mother   . Diabetes Maternal Uncle   . Diabetes Paternal Grandmother     Social History Social  History   Tobacco Use  . Smoking status: Never Smoker  . Smokeless tobacco: Never Used  Substance Use Topics  . Alcohol use: No  . Drug use: No     Allergies   Patient has no known allergies.   Review of Systems Review of Systems  Gastrointestinal: Positive for abdominal pain, nausea and vomiting.  Psychiatric/Behavioral: Negative for dysphoric mood.  All other systems reviewed and are negative.    Physical Exam Updated Vital Signs BP 120/87 (BP Location: Right Arm)   Pulse 76   Temp 98.1 F (36.7 C) (Oral)   Resp 16   LMP 09/22/2017   SpO2 100%   Physical Exam    Constitutional: She appears well-developed and well-nourished.  Appears mildly uncomfortable  HENT:  Head: Normocephalic and atraumatic.  Eyes: Conjunctivae are normal. Pupils are equal, round, and reactive to light.  Neck: Neck supple. No tracheal deviation present. No thyromegaly present.  Cardiovascular: Normal rate and regular rhythm.  No murmur heard. Pulmonary/Chest: Effort normal and breath sounds normal.  Abdominal: Soft. Bowel sounds are normal. She exhibits no distension. There is tenderness. There is no guarding.  Tender at left lower quadrant, minimally tender at left upper quadrant  Genitourinary:  Genitourinary Comments: Pelvic exam no external lesion.  Slight amount of white discharge in vault.  Cervical loss closed.  Positive cervical motion tenderness.  Positive left adnexal tenderness  Musculoskeletal: Normal range of motion. She exhibits no edema or tenderness.  Neurological: She is alert. Coordination normal.  Skin: Skin is warm and dry. No rash noted.  Psychiatric: She has a normal mood and affect.  Nursing note and vitals reviewed.    ED Treatments / Results  Labs (all labs ordered are listed, but only abnormal results are displayed) Labs Reviewed  COMPREHENSIVE METABOLIC PANEL - Abnormal; Notable for the following components:      Result Value   CO2 21 (*)    Total Bilirubin 0.1 (*)    All other components within normal limits  WET PREP, GENITAL  LIPASE, BLOOD  CBC  URINALYSIS, ROUTINE W REFLEX MICROSCOPIC  RPR  HIV ANTIBODY (ROUTINE TESTING)  I-STAT BETA HCG BLOOD, ED ([facility], [facility], [facility] ONLY)  GC/CHLAMYDIA PROBE AMP ([place]) NOT AT [facility]    EKG  EKG Interpretation None      Results for orders placed or performed during the hospital encounter of 10/13/17  Wet prep, genital  Result Value Ref Range   Yeast Wet Prep HPF POC NONE SEEN NONE SEEN   Trich, Wet Prep NONE SEEN NONE SEEN   Clue Cells Wet Prep HPF POC PRESENT (A) NONE SEEN   WBC, Wet  Prep HPF POC MODERATE (A) NONE SEEN   Sperm NONE SEEN   Lipase, blood  Result Value Ref Range   Lipase 30 11 - 51 U/L  Comprehensive metabolic panel  Result Value Ref Range   Sodium 139 135 - 145 mmol/L   Potassium 3.9 3.5 - 5.1 mmol/L   Chloride 107 101 - 111 mmol/L   CO2 21 (L) 22 - 32 mmol/L   Glucose, Bld 91 65 - 99 mg/dL   BUN 14 6 - 20 mg/dL   Creatinine, Ser 0.98 0.44 - 1.00 mg/dL   Calcium 9.1 8.9 - 10.3 mg/dL   Total Protein 7.1 6.5 - 8.1 g/dL   Albumin 4.0 3.5 - 5.0 g/dL   AST 26 15 - 41 U/L   ALT 24 14 - 54 U/L   Alkaline Phosphatase 93  38 - 126 U/L   Total Bilirubin 0.1 (L) 0.3 - 1.2 mg/dL   GFR calc non Af Amer >60 >60 mL/min   GFR calc Af Amer >60 >60 mL/min   Anion gap 11 5 - 15  CBC  Result Value Ref Range   WBC 8.3 4.0 - 10.5 K/uL   RBC 4.19 3.87 - 5.11 MIL/uL   Hemoglobin 12.4 12.0 - 15.0 g/dL   HCT 38.4 36.0 - 46.0 %   MCV 91.6 78.0 - 100.0 fL   MCH 29.6 26.0 - 34.0 pg   MCHC 32.3 30.0 - 36.0 g/dL   RDW 13.4 11.5 - 15.5 %   Platelets 264 150 - 400 K/uL  Urinalysis, Routine w reflex microscopic  Result Value Ref Range   Color, Urine YELLOW YELLOW   APPearance CLEAR CLEAR   Specific Gravity, Urine 1.023 1.005 - 1.030   pH 7.0 5.0 - 8.0   Glucose, UA NEGATIVE NEGATIVE mg/dL   Hgb urine dipstick NEGATIVE NEGATIVE   Bilirubin Urine NEGATIVE NEGATIVE   Ketones, ur NEGATIVE NEGATIVE mg/dL   Protein, ur NEGATIVE NEGATIVE mg/dL   Nitrite NEGATIVE NEGATIVE   Leukocytes, UA NEGATIVE NEGATIVE  I-Stat beta hCG blood, ED  Result Value Ref Range   I-stat hCG, quantitative <5.0 <5 mIU/mL   Comment 3           US Pelvis Transvanginal Non-ob (tv Only)  Result Date: 10/13/2017 CLINICAL DATA:  Left lower quadrant pain. Left adnexal tenderness for 1 week. EXAM: TRANSABDOMINAL AND TRANSVAGINAL ULTRASOUND OF PELVIS DOPPLER ULTRASOUND OF OVARIES TECHNIQUE: Both transabdominal and transvaginal ultrasound examinations of the pelvis were performed. Transabdominal  technique was performed for global imaging of the pelvis including uterus, ovaries, adnexal regions, and pelvic cul-de-sac. It was necessary to proceed with endovaginal exam following the transabdominal exam to visualize the endometrium and ovaries. Color and duplex Doppler ultrasound was utilized to evaluate blood flow to the ovaries. COMPARISON:  CT abdomen and pelvis 09/07/2017 FINDINGS: Uterus Measurements: 7.9 x 4.8 x 6.2 cm. Uterus is anteverted and mildly retroflexed. No fibroids or other mass visualized. Small nabothian cysts in the cervix. Endometrium Thickness: 15.6 mm.  No focal abnormality visualized. Right ovary Measurements: 3.7 x 1.5 x 2.8 cm. Normal appearance/no adnexal mass. Left ovary Measurements: 3.8 x 2 x 2.8 cm. Small complex cyst measuring 1.8 cm maximal diameter, likely representing a hemorrhagic cyst. No abnormal adnexal masses demonstrated. Pulsed Doppler evaluation of both ovaries demonstrates normal low-resistance arterial and venous waveforms. Other findings Small amount of free fluid. IMPRESSION: Normal ultrasound appearance of the uterus and ovaries. No evidence of significant ovarian mass or torsion. Electronically Signed   By: Lucienne Capers M.D.   On: 10/13/2017 23:34   US Pelvis Complete  Result Date: 10/13/2017 CLINICAL DATA:  Left lower quadrant pain. Left adnexal tenderness for 1 week. EXAM: TRANSABDOMINAL AND TRANSVAGINAL ULTRASOUND OF PELVIS DOPPLER ULTRASOUND OF OVARIES TECHNIQUE: Both transabdominal and transvaginal ultrasound examinations of the pelvis were performed. Transabdominal technique was performed for global imaging of the pelvis including uterus, ovaries, adnexal regions, and pelvic cul-de-sac. It was necessary to proceed with endovaginal exam following the transabdominal exam to visualize the endometrium and ovaries. Color and duplex Doppler ultrasound was utilized to evaluate blood flow to the ovaries. COMPARISON:  CT abdomen and pelvis 09/07/2017  FINDINGS: Uterus Measurements: 7.9 x 4.8 x 6.2 cm. Uterus is anteverted and mildly retroflexed. No fibroids or other mass visualized. Small nabothian cysts in the cervix. Endometrium Thickness: 15.6  mm.  No focal abnormality visualized. Right ovary Measurements: 3.7 x 1.5 x 2.8 cm. Normal appearance/no adnexal mass. Left ovary Measurements: 3.8 x 2 x 2.8 cm. Small complex cyst measuring 1.8 cm maximal diameter, likely representing a hemorrhagic cyst. No abnormal adnexal masses demonstrated. Pulsed Doppler evaluation of both ovaries demonstrates normal low-resistance arterial and venous waveforms. Other findings Small amount of free fluid. IMPRESSION: Normal ultrasound appearance of the uterus and ovaries. No evidence of significant ovarian mass or torsion. Electronically Signed   By: Lucienne Capers M.D.   On: 10/13/2017 23:34   US Pelvic Doppler (torsion R/o Or Mass Arterial Flow)  Result Date: 10/13/2017 CLINICAL DATA:  Left lower quadrant pain. Left adnexal tenderness for 1 week. EXAM: TRANSABDOMINAL AND TRANSVAGINAL ULTRASOUND OF PELVIS DOPPLER ULTRASOUND OF OVARIES TECHNIQUE: Both transabdominal and transvaginal ultrasound examinations of the pelvis were performed. Transabdominal technique was performed for global imaging of the pelvis including uterus, ovaries, adnexal regions, and pelvic cul-de-sac. It was necessary to proceed with endovaginal exam following the transabdominal exam to visualize the endometrium and ovaries. Color and duplex Doppler ultrasound was utilized to evaluate blood flow to the ovaries. COMPARISON:  CT abdomen and pelvis 09/07/2017 FINDINGS: Uterus Measurements: 7.9 x 4.8 x 6.2 cm. Uterus is anteverted and mildly retroflexed. No fibroids or other mass visualized. Small nabothian cysts in the cervix. Endometrium Thickness: 15.6 mm.  No focal abnormality visualized. Right ovary Measurements: 3.7 x 1.5 x 2.8 cm. Normal appearance/no adnexal mass. Left ovary Measurements: 3.8 x 2 x 2.8  cm. Small complex cyst measuring 1.8 cm maximal diameter, likely representing a hemorrhagic cyst. No abnormal adnexal masses demonstrated. Pulsed Doppler evaluation of both ovaries demonstrates normal low-resistance arterial and venous waveforms. Other findings Small amount of free fluid. IMPRESSION: Normal ultrasound appearance of the uterus and ovaries. No evidence of significant ovarian mass or torsion. Electronically Signed   By: Lucienne Capers M.D.   On: 10/13/2017 23:34   Radiology No results found.  Procedures Procedures (including critical care time)  Medications Ordered in ED Medications  morphine 4 MG/ML injection 4 mg (not administered)  metoCLOPramide (REGLAN) injection 10 mg (not administered)     Initial Impression / Assessment and Plan / ED Course  I have reviewed the triage vital signs and the nursing notes.  Pertinent labs & imaging results that were available during my care of the patient were reviewed by me and considered in my medical decision making (see chart for details).     12:20 AM reports no improvement of pain and nausea after treatment with intravenous morphine and Reglan.  Additional IV morphine and IV Zofran ordered.  12:50 AM patient's pain and nausea are under control.  She is able to drink water without vomiting and feels ready to go home Patient has had multiple imaging studies of abdomen and pelvis since 2018.  Last study all studies unremarkable except for CT scan of abdomen and pelvis July 2018 which showed acute pancreatitis.  Strongly doubt acute pancreatitis in light of the fact that she has normal lipase only one episode of vomiting.  Do not feel that it would be of patient's benefit to repeat her CT scan or further imaging today.  She has no peritonitis.  No fever.  Normal lipase.  No leukocytosis.  Plan [facility].  Prescription Norco.  Zofran.  metrogel [place] Controlled Substance reporting System  queried.  [facility] Final Clinical Impressions(s) / ED Diagnoses   #1  left lower quadrant pain #2 hemorrhagic ovarian cyst Final diagnoses:  None   #3 bacterial vaginosis ED Discharge Orders    None       Orlie Dakin, MD 10/14/17 0100    Orlie Dakin, MD 10/14/17 [phone]

## 2017-10-13 NOTE — ED Triage Notes (Signed)
Pt reports left side abd pain x 5 days. Has been seen at [facility] for same but reports no relief with meds given. Denies n/v/d.

## 2017-10-14 LAB — GC/CHLAMYDIA PROBE AMP (~~LOC~~) NOT AT ARMC
Chlamydia: NEGATIVE
Neisseria Gonorrhea: NEGATIVE

## 2017-10-14 LAB — RPR: RPR: NONREACTIVE

## 2017-10-14 MED ORDER — ONDANSETRON HCL 8 MG PO TABS: 8.0000 mg | ORAL_TABLET | Freq: Three times a day (TID) | ORAL | 0 refills | Status: DC | PRN

## 2017-10-14 MED ORDER — METRONIDAZOLE 0.75 % VA GEL: 1.0000 | Freq: Two times a day (BID) | VAGINAL | 0 refills | Status: DC

## 2017-10-14 MED ORDER — HYDROCODONE-ACETAMINOPHEN 5-325 MG PO TABS: 1.0000 | ORAL_TABLET | Freq: Four times a day (QID) | ORAL | 0 refills | Status: DC | PRN

## 2017-10-14 MED ORDER — MORPHINE SULFATE (PF) 4 MG/ML IV SOLN
6.0000 mg | Freq: Once | INTRAVENOUS | Status: AC
  Administered 2017-10-14: 6 mg via INTRAVENOUS
  Filled 2017-10-14: qty 2

## 2017-10-14 MED ORDER — ONDANSETRON HCL 4 MG/2ML IJ SOLN
4.0000 mg | Freq: Once | INTRAMUSCULAR | Status: AC
  Administered 2017-10-14: 4 mg via INTRAVENOUS
  Filled 2017-10-14: qty 2

## 2017-10-14 NOTE — Discharge Instructions (Signed)
Take medications as prescribed.  [facility] the [facility] tomorrow to schedule the next available appointment.

## 2017-10-15 LAB — HIV ANTIBODY (ROUTINE TESTING W REFLEX): HIV SCREEN 4TH GENERATION: NONREACTIVE

## 2017-11-13 ENCOUNTER — Emergency Department (HOSPITAL_COMMUNITY): Payer: Medicaid Other

## 2017-11-13 ENCOUNTER — Emergency Department (HOSPITAL_COMMUNITY)
Admission: EM | Admit: 2017-11-13 | Discharge: 2017-11-13 | Disposition: A | Discharge status: Home / Self Care | Payer: Medicaid Other | Attending: Emergency Medicine | Admitting: Emergency Medicine

## 2017-11-13 ENCOUNTER — Encounter (HOSPITAL_COMMUNITY): Payer: Self-pay | Admitting: Emergency Medicine

## 2017-11-13 DIAGNOSIS — R079 Chest pain, unspecified: Secondary | ICD-10-CM | POA: Insufficient documentation

## 2017-11-13 DIAGNOSIS — Z5321 Procedure and treatment not carried out due to patient leaving prior to being seen by health care provider: Secondary | ICD-10-CM | POA: Insufficient documentation

## 2017-11-13 LAB — COMPREHENSIVE METABOLIC PANEL
ALBUMIN: 4.2 g/dL (ref 3.5–5.0)
ALT: 22 U/L (ref 14–54)
ANION GAP: 10 (ref 5–15)
AST: 24 U/L (ref 15–41)
Alkaline Phosphatase: 87 U/L (ref 38–126)
BILIRUBIN TOTAL: 0.5 mg/dL (ref 0.3–1.2)
BUN: 13 mg/dL (ref 6–20)
CHLORIDE: 108 mmol/L (ref 101–111)
CO2: 18 mmol/L — AB (ref 22–32)
Calcium: 9 mg/dL (ref 8.9–10.3)
Creatinine, Ser: 0.73 mg/dL (ref 0.44–1.00)
GFR calc Af Amer: >60 mL/min (ref >60)
GFR calc non Af Amer: >60 mL/min (ref >60)
Glucose, Bld: 92 mg/dL (ref 65–99)
POTASSIUM: 3.9 mmol/L (ref 3.5–5.1)
SODIUM: 136 mmol/L (ref 135–145)
TOTAL PROTEIN: 7.2 g/dL (ref 6.5–8.1)

## 2017-11-13 LAB — URINALYSIS, ROUTINE W REFLEX MICROSCOPIC
Bilirubin Urine: NEGATIVE
Glucose, UA: NEGATIVE mg/dL
Hgb urine dipstick: NEGATIVE
Ketones, ur: NEGATIVE mg/dL
LEUKOCYTES UA: NEGATIVE
Nitrite: NEGATIVE
PROTEIN: NEGATIVE mg/dL
SPECIFIC GRAVITY, URINE: 1.017 (ref 1.005–1.030)
pH: 5 (ref 5.0–8.0)

## 2017-11-13 LAB — CBC
HEMATOCRIT: 38.5 % (ref 36.0–46.0)
HEMOGLOBIN: 12.7 g/dL (ref 12.0–15.0)
MCH: 30.3 pg (ref 26.0–34.0)
MCHC: 33 g/dL (ref 30.0–36.0)
MCV: 91.9 fL (ref 78.0–100.0)
Platelets: 282 10*3/uL (ref 150–400)
RBC: 4.19 MIL/uL (ref 3.87–5.11)
RDW: 13.3 % (ref 11.5–15.5)
WBC: 9.3 10*3/uL (ref 4.0–10.5)

## 2017-11-13 LAB — I-STAT TROPONIN, ED: Troponin i, poc: 0 ng/mL (ref 0.00–0.08)

## 2017-11-13 LAB — I-STAT BETA HCG BLOOD, ED (MC, WL, AP ONLY)

## 2017-11-13 LAB — LIPASE, BLOOD: LIPASE: 28 U/L (ref 11–51)

## 2017-11-13 NOTE — ED Notes (Signed)
pts name called could be found, no answer

## 2017-11-13 NOTE — ED Notes (Signed)
pts name called no answer

## 2017-11-13 NOTE — ED Triage Notes (Addendum)
Left chest and breast pain x 2 days some nausea and sob, no breast redness  Some diarrhea

## 2017-11-14 NOTE — ED Notes (Signed)
11/13/2017,  16:16 Follow up call not completed, no answer.

## 2017-11-26 ENCOUNTER — Encounter: Payer: Self-pay | Admitting: Obstetrics & Gynecology

## 2017-12-21 ENCOUNTER — Emergency Department (HOSPITAL_COMMUNITY)
Admission: EM | Admit: 2017-12-21 | Discharge: 2017-12-22 | Disposition: A | Discharge status: Home / Self Care | Payer: Medicaid Other | Attending: Emergency Medicine | Admitting: Emergency Medicine

## 2017-12-21 ENCOUNTER — Encounter (HOSPITAL_COMMUNITY): Payer: Self-pay

## 2017-12-21 ENCOUNTER — Other Ambulatory Visit: Payer: Self-pay

## 2017-12-21 DIAGNOSIS — G8929 Other chronic pain: Secondary | ICD-10-CM

## 2017-12-21 DIAGNOSIS — R1010 Upper abdominal pain, unspecified: Secondary | ICD-10-CM | POA: Insufficient documentation

## 2017-12-21 DIAGNOSIS — R110 Nausea: Secondary | ICD-10-CM | POA: Insufficient documentation

## 2017-12-21 DIAGNOSIS — J45909 Unspecified asthma, uncomplicated: Secondary | ICD-10-CM | POA: Insufficient documentation

## 2017-12-21 DIAGNOSIS — R109 Unspecified abdominal pain: Secondary | ICD-10-CM

## 2017-12-21 LAB — URINALYSIS, ROUTINE W REFLEX MICROSCOPIC
BACTERIA UA: NONE SEEN
BILIRUBIN URINE: NEGATIVE
Glucose, UA: NEGATIVE mg/dL
KETONES UR: 5 mg/dL — AB
LEUKOCYTES UA: NEGATIVE
Nitrite: NEGATIVE
PROTEIN: NEGATIVE mg/dL
Specific Gravity, Urine: 1.028 (ref 1.005–1.030)
pH: 5 (ref 5.0–8.0)

## 2017-12-21 LAB — CBC
HEMATOCRIT: 35.6 % — AB (ref 36.0–46.0)
Hemoglobin: 11.8 g/dL — ABNORMAL LOW (ref 12.0–15.0)
MCH: 29.7 pg (ref 26.0–34.0)
MCHC: 33.1 g/dL (ref 30.0–36.0)
MCV: 89.7 fL (ref 78.0–100.0)
PLATELETS: 292 10*3/uL (ref 150–400)
RBC: 3.97 MIL/uL (ref 3.87–5.11)
RDW: 12.7 % (ref 11.5–15.5)
WBC: 9.1 10*3/uL (ref 4.0–10.5)

## 2017-12-21 LAB — COMPREHENSIVE METABOLIC PANEL
ALT: 24 U/L (ref 14–54)
AST: 24 U/L (ref 15–41)
Albumin: 4.1 g/dL (ref 3.5–5.0)
Alkaline Phosphatase: 89 U/L (ref 38–126)
Anion gap: 7 (ref 5–15)
BUN: 17 mg/dL (ref 6–20)
CO2: 27 mmol/L (ref 22–32)
CREATININE: 0.83 mg/dL (ref 0.44–1.00)
Calcium: 9 mg/dL (ref 8.9–10.3)
Chloride: 110 mmol/L (ref 101–111)
GFR calc non Af Amer: >60 mL/min (ref >60)
Glucose, Bld: 98 mg/dL (ref 65–99)
POTASSIUM: 3.8 mmol/L (ref 3.5–5.1)
SODIUM: 144 mmol/L (ref 135–145)
Total Bilirubin: 0.7 mg/dL (ref 0.3–1.2)
Total Protein: 7.3 g/dL (ref 6.5–8.1)

## 2017-12-21 LAB — LIPASE, BLOOD: Lipase: 32 U/L (ref 11–51)

## 2017-12-21 LAB — I-STAT BETA HCG BLOOD, ED (MC, WL, AP ONLY)

## 2017-12-21 MED ORDER — HYDROMORPHONE HCL 1 MG/ML IJ SOLN
0.5000 mg | Freq: Once | INTRAMUSCULAR | Status: AC
  Administered 2017-12-21: 0.5 mg via INTRAVENOUS
  Filled 2017-12-21: qty 1

## 2017-12-21 MED ORDER — ONDANSETRON HCL 4 MG/2ML IJ SOLN
4.0000 mg | Freq: Once | INTRAMUSCULAR | Status: AC
  Administered 2017-12-21: 4 mg via INTRAVENOUS
  Filled 2017-12-21: qty 2

## 2017-12-21 MED ORDER — KETOROLAC TROMETHAMINE 30 MG/ML IJ SOLN
30.0000 mg | Freq: Once | INTRAMUSCULAR | Status: AC
  Administered 2017-12-21: 30 mg via INTRAVENOUS
  Filled 2017-12-21: qty 1

## 2017-12-21 MED ORDER — GI COCKTAIL ~~LOC~~
30.0000 mL | Freq: Once | ORAL | Status: AC
  Administered 2017-12-21: 30 mL via ORAL
  Filled 2017-12-21: qty 30

## 2017-12-21 MED ORDER — SODIUM CHLORIDE 0.9 % IV BOLUS
1000.0000 mL | Freq: Once | INTRAVENOUS | Status: AC
  Administered 2017-12-21: 1000 mL via INTRAVENOUS

## 2017-12-21 NOTE — ED Triage Notes (Signed)
States today abdominal pain all over abdomen with nausea and diarrhea no dysuria no fever. States feels like when she had pancreatitis.

## 2017-12-21 NOTE — ED Provider Notes (Signed)
[facility] DEPT Provider Note   CSN: [id number] Arrival date & time: 12/21/17  2132     History   Chief Complaint Chief Complaint  Patient presents with  . Abdominal Pain    HPI    Blood pressure (!) 131/92, pulse 89, temperature 98.7 F (37.1 C), temperature source Oral, resp. rate 18, height 5' (1.524 m), weight 83 kg (183 lb), SpO2 99 %.  Lorraine [name] is a 32 y.o. female complaining of acute onset of severe upper abdominal pain this morning with associated nausea, no vomiting, fever, change in bowel or bladder habits.  No pain medications taken prior to arrival, symptoms consistent with prior episodes of pancreatitis.  She denies any alcohol intake.  She is status post cholecystectomy.  Past Medical History:  Diagnosis Date  . Asthma    Inhaler used 01/02/16  . Diverticulitis   . Nausea & vomiting 04/02/2017  . Pancreatitis   . Pre-diabetes   . Pyelonephritis   . Pyelonephritis     Patient Active Problem List   Diagnosis Date Noted  . Acute pancreatitis 04/02/2017  . Abdominal pain, acute, left upper quadrant 04/02/2017  . Intractable nausea and vomiting 04/02/2017  . Asthma in adult 04/02/2017  . Obesity (BMI 30.0-34.9) 04/02/2017  . Fatty infiltration of liver 04/02/2017  . Gastroesophageal reflux disease without esophagitis   . Pre-diabetes 07/15/2015  . Diarrhea   . Hypokalemia   . Bacterial vaginosis   . Pyelonephritis 07/07/2015  . Abdominal pain, right upper quadrant   . Asthma 10/31/2014  . History of preterm delivery 10/22/2014  . Status post repeat low transverse cesarean section 09/06/2014  . Diverticulitis 09/10/2012    Past Surgical History:  Procedure Laterality Date  . CESAREAN SECTION    . CESAREAN SECTION N/A 2006  . CESAREAN SECTION N/A 09/03/2014   Procedure: CESAREAN SECTION;  Surgeon: Guss Bunde, MD;  Location: [facility];  Service: Obstetrics;  Laterality: N/A;  . CHOLECYSTECTOMY    .  ESOPHAGOGASTRODUODENOSCOPY (EGD) WITH PROPOFOL Left 04/07/2017   Procedure: ESOPHAGOGASTRODUODENOSCOPY (EGD) WITH PROPOFOL;  Surgeon: Ronnette Juniper, MD;  Location: [facility];  Service: Gastroenterology;  Laterality: Left;  . TUBAL LIGATION       OB History    Gravida  5   Para  3   Term  1   Preterm  2   AB  2   Living  3     SAB  2   TAB      Ectopic      Multiple  1   Live Births  3            Home Medications    Prior to Admission medications   Medication Sig Start Date End Date Taking? Authorizing Provider  albuterol (PROVENTIL HFA;VENTOLIN HFA) 108 (90 Base) MCG/ACT inhaler Inhale 2 puffs into the lungs every 6 (six) hours as needed for wheezing or shortness of breath.    [provider]  HYDROcodone-acetaminophen (NORCO) 5-325 MG tablet Take 1 tablet by mouth every 6 (six) hours as needed for severe pain. Labeled in Spanish 10/14/17   Orlie Dakin, MD  ibuprofen (ADVIL,MOTRIN) 600 MG tablet Take 1 tablet (600 mg total) by mouth every 6 (six) hours as needed. Patient taking differently: Take 600 mg by mouth every 6 (six) hours as needed (pain).  10/10/17   Luvenia Redden, PA-C  metroNIDAZOLE (FLAGYL) 500 MG tablet Take 1 tablet (500 mg total) by mouth 2 (two) times daily.  Patient taking differently: Take 500 mg by mouth 2 (two) times daily. 7 day course started 10/10/17 10/10/17   Luvenia Redden, PA-C  metroNIDAZOLE (METROGEL VAGINAL) 0.75 % vaginal gel Place 1 Applicatorful vaginally 2 (two) times daily. Label in  spanish 10/14/17   Orlie Dakin, MD  ondansetron (ZOFRAN) 8 MG tablet Take 1 tablet (8 mg total) by mouth every 8 (eight) hours as needed for nausea. 10/14/17   Orlie Dakin, MD    Family History Family History  Problem Relation Age of Onset  . Hyperlipidemia Mother   . Diabetes Maternal Uncle   . Diabetes Paternal Grandmother     Social History Social History   Tobacco Use  . Smoking status: Never Smoker  . Smokeless  tobacco: Never Used  Substance Use Topics  . Alcohol use: No  . Drug use: No     Allergies   Patient has no known allergies.   Review of Systems Review of Systems   A complete review of systems was obtained and all systems are negative except as noted in the HPI and PMH.   Physical Exam Updated Vital Signs BP (!) 131/92 (BP Location: Right Arm)   Pulse 89   Temp 98.7 F (37.1 C) (Oral)   Resp 18   Ht 5' (1.524 m)   Wt 83 kg (183 lb)   SpO2 99%   BMI 35.74 kg/m   Physical Exam  Constitutional: She is oriented to person, place, and time. She appears well-developed and well-nourished. No distress.  HENT:  Head: Normocephalic and atraumatic.  Mouth/Throat: Oropharynx is clear and moist.  Eyes: Pupils are equal, round, and reactive to light. Conjunctivae and EOM are normal.  Neck: Normal range of motion.  Cardiovascular: Normal rate, regular rhythm and intact distal pulses.  Pulmonary/Chest: Effort normal and breath sounds normal.  Abdominal: Soft. There is tenderness.  Under in the left upper and epigastrium with no guarding or rebound.  Musculoskeletal: Normal range of motion.  Neurological: She is alert and oriented to person, place, and time.  Skin: She is not diaphoretic.  Psychiatric: She has a normal mood and affect.  Nursing note and vitals reviewed.    ED Treatments / Results  Labs (all labs ordered are listed, but only abnormal results are displayed) Labs Reviewed  LIPASE, BLOOD  COMPREHENSIVE METABOLIC PANEL  CBC  URINALYSIS, ROUTINE W REFLEX MICROSCOPIC  I-STAT BETA HCG BLOOD, ED ([facility], [facility], [facility] ONLY)    EKG None  Radiology No results found.  Procedures Procedures (including critical care time)  Medications Ordered in ED Medications  sodium chloride 0.9 % bolus 1,000 mL (has no administration in time range)  HYDROmorphone (DILAUDID) injection 0.5 mg (has no administration in time range)  ondansetron (ZOFRAN) injection 4 mg (has no  administration in time range)     Initial Impression / Assessment and Plan / ED Course  I have reviewed the triage vital signs and the nursing notes.  Pertinent labs & imaging results that were available during my care of the patient were reviewed by me and considered in my medical decision making (see chart for details).     Vitals:   12/21/17 2200  BP: (!) 131/92  Pulse: 89  Resp: 18  Temp: 98.7 F (37.1 C)  TempSrc: Oral  SpO2: 99%  Weight: 83 kg (183 lb)  Height: 5' (1.524 m)    Medications  haloperidol lactate (HALDOL) injection 5 mg (has no administration in time range)  prochlorperazine (COMPAZINE) injection 5  mg (has no administration in time range)  sodium chloride 0.9 % bolus 1,000 mL (has no administration in time range)  sodium chloride 0.9 % bolus 1,000 mL (1,000 mLs Intravenous New Bag/Given 12/21/17 2315)  HYDROmorphone (DILAUDID) injection 0.5 mg (0.5 mg Intravenous Given 12/21/17 2315)  ondansetron (ZOFRAN) injection 4 mg (4 mg Intravenous Given 12/21/17 2315)  ketorolac (TORADOL) 30 MG/ML injection 30 mg (30 mg Intravenous Given 12/21/17 2342)  gi cocktail (Maalox,Lidocaine,Donnatal) (30 mLs Oral Given 12/21/17 2342)    Teara Duerksen is 32 y.o. female presenting with acute onset of severe upper abdominal pain starting this morning with no emesis.  Afebrile, nontoxic-appearing.  Pending blood work patient given Dilaudid and fluids.  Multiple negative CT abdomen pelvis in the past,  Blood work reassuring with no abnormality, repeat abdominal exam unchanged.  She continues to vomit in the department.  Pending UDS, will give Haldol and Compazine.  Case signed out to PA Humes at shift change: Plan is to reassess p.o. challenge  Final Clinical Impressions(s) / ED Diagnoses   Final diagnoses:  None    ED Discharge Orders    None       Hattie Pine, Charna Elizabeth 12/22/17 0017    Lajean Saver, MD 12/22/17 [phone]

## 2017-12-22 LAB — RAPID URINE DRUG SCREEN, HOSP PERFORMED
AMPHETAMINES: NOT DETECTED
Barbiturates: NOT DETECTED
Benzodiazepines: NOT DETECTED
Cocaine: NOT DETECTED
Opiates: NOT DETECTED
Tetrahydrocannabinol: NOT DETECTED

## 2017-12-22 MED ORDER — HALOPERIDOL LACTATE 5 MG/ML IJ SOLN
5.0000 mg | Freq: Once | INTRAMUSCULAR | Status: AC
  Administered 2017-12-22: 5 mg via INTRAVENOUS
  Filled 2017-12-22: qty 1

## 2017-12-22 MED ORDER — PROMETHAZINE HCL 25 MG/ML IJ SOLN
12.5000 mg | Freq: Once | INTRAMUSCULAR | Status: AC
  Administered 2017-12-22: 12.5 mg via INTRAVENOUS
  Filled 2017-12-22: qty 1

## 2017-12-22 MED ORDER — PROMETHAZINE HCL 25 MG PO TABS: 25.0000 mg | ORAL_TABLET | Freq: Four times a day (QID) | ORAL | 0 refills | Status: DC | PRN

## 2017-12-22 MED ORDER — PROCHLORPERAZINE EDISYLATE 5 MG/ML IJ SOLN
5.0000 mg | Freq: Once | INTRAMUSCULAR | Status: AC
  Administered 2017-12-22: 5 mg via INTRAVENOUS
  Filled 2017-12-22: qty 2

## 2017-12-22 MED ORDER — SODIUM CHLORIDE 0.9 % IV BOLUS
1000.0000 mL | Freq: Once | INTRAVENOUS | Status: AC
  Administered 2017-12-22: 1000 mL via INTRAVENOUS

## 2017-12-22 MED ORDER — DICYCLOMINE HCL 10 MG/ML IM SOLN
20.0000 mg | Freq: Once | INTRAMUSCULAR | Status: AC
  Administered 2017-12-22: 20 mg via INTRAMUSCULAR
  Filled 2017-12-22: qty 2

## 2017-12-22 MED ORDER — DICYCLOMINE HCL 20 MG PO TABS: 20.0000 mg | ORAL_TABLET | Freq: Two times a day (BID) | ORAL | 0 refills | Status: DC | PRN

## 2017-12-22 NOTE — ED Provider Notes (Signed)
12:30 AM Patient care assumed from [facility], PA-C at change of shift.  Patient with history of chronic abdominal pain presenting today for worsening pain with nausea.  Laboratory workup has been reassuring.  Vitals stable.  She has been given medications for symptom control, but continues to experience discomfort.  Will attempt second dose of medications for pain.  Plan for observation and repeat assessment.  3:58 AM Patient with soft abdomen.  No focal tenderness or distention.  She states that she feels more comfortable since receiving her last dose of medications.  She is having some residual abdominal discomfort and mild nausea.  Will give Phenergan and Bentyl.  Plan for fluid challenge.  If able to tolerate, stable for discharge.  5:32 AM Patient has been able to fluid challenge without difficulty.  We will continue with outpatient management.  Patient prescribed Bentyl and Phenergan for symptom control.  Return precautions provided at discharge.  Patient discharged in stable condition with no unaddressed concerns.  Results for orders placed or performed during the hospital encounter of 12/21/17  Lipase, blood  Result Value Ref Range   Lipase 32 11 - 51 U/L  Comprehensive metabolic panel  Result Value Ref Range   Sodium 144 135 - 145 mmol/L   Potassium 3.8 3.5 - 5.1 mmol/L   Chloride 110 101 - 111 mmol/L   CO2 27 22 - 32 mmol/L   Glucose, Bld 98 65 - 99 mg/dL   BUN 17 6 - 20 mg/dL   Creatinine, Ser 0.83 0.44 - 1.00 mg/dL   Calcium 9.0 8.9 - 10.3 mg/dL   Total Protein 7.3 6.5 - 8.1 g/dL   Albumin 4.1 3.5 - 5.0 g/dL   AST 24 15 - 41 U/L   ALT 24 14 - 54 U/L   Alkaline Phosphatase 89 38 - 126 U/L   Total Bilirubin 0.7 0.3 - 1.2 mg/dL   GFR calc non Af Amer >60 >60 mL/min   GFR calc Af Amer >60 >60 mL/min   Anion gap 7 5 - 15  CBC  Result Value Ref Range   WBC 9.1 4.0 - 10.5 K/uL   RBC 3.97 3.87 - 5.11 MIL/uL   Hemoglobin 11.8 (L) 12.0 - 15.0 g/dL   HCT 35.6 (L) 36.0 -  46.0 %   MCV 89.7 78.0 - 100.0 fL   MCH 29.7 26.0 - 34.0 pg   MCHC 33.1 30.0 - 36.0 g/dL   RDW 12.7 11.5 - 15.5 %   Platelets 292 150 - 400 K/uL  Urinalysis, Routine w reflex microscopic  Result Value Ref Range   Color, Urine YELLOW YELLOW   APPearance CLEAR CLEAR   Specific Gravity, Urine 1.028 1.005 - 1.030   pH 5.0 5.0 - 8.0   Glucose, UA NEGATIVE NEGATIVE mg/dL   Hgb urine dipstick SMALL (A) NEGATIVE   Bilirubin Urine NEGATIVE NEGATIVE   Ketones, ur 5 (A) NEGATIVE mg/dL   Protein, ur NEGATIVE NEGATIVE mg/dL   Nitrite NEGATIVE NEGATIVE   Leukocytes, UA NEGATIVE NEGATIVE   RBC / HPF 0-5 0 - 5 RBC/hpf   WBC, UA 0-5 0 - 5 WBC/hpf   Bacteria, UA NONE SEEN NONE SEEN   Squamous Epithelial / LPF 0-5 (A) NONE SEEN   Mucus PRESENT   Rapid urine drug screen (hospital performed)  Result Value Ref Range   Opiates NONE DETECTED NONE DETECTED   Cocaine NONE DETECTED NONE DETECTED   Benzodiazepines NONE DETECTED NONE DETECTED   Amphetamines NONE DETECTED NONE DETECTED  Tetrahydrocannabinol NONE DETECTED NONE DETECTED   Barbiturates NONE DETECTED NONE DETECTED  I-Stat beta hCG blood, ED  Result Value Ref Range   I-stat hCG, quantitative <5.0 <5 mIU/mL   Comment 3             Antonietta Breach, PA-C 12/22/17 0533    Palumbo, April, MD 12/22/17 [phone]

## 2017-12-22 NOTE — Discharge Instructions (Signed)
Su trabajo en el departamento de emergencias de hoy fue tranquilizador. [employer] se prescribe para el dolor abdominal persistente. Usted puede tomar Phenergan para las nuseas. Consulte a un mdico de atencin primaria para garantizar la resolucin de sus sntomas.  Your work up in the emergency department today was reassuring. Take Bentyl as prescribed for persistent abdominal pain. You may take Phenergan for nausea. follow upw ith a primary care doctor to ensure resolution of your symptoms.

## 2018-01-03 ENCOUNTER — Emergency Department (HOSPITAL_COMMUNITY)
Admission: EM | Admit: 2018-01-03 | Discharge: 2018-01-03 | Disposition: A | Discharge status: Home / Self Care | Payer: Medicaid Other | Attending: Emergency Medicine | Admitting: Emergency Medicine

## 2018-01-03 ENCOUNTER — Other Ambulatory Visit: Payer: Self-pay

## 2018-01-03 ENCOUNTER — Emergency Department (HOSPITAL_COMMUNITY): Payer: Medicaid Other

## 2018-01-03 ENCOUNTER — Emergency Department (HOSPITAL_COMMUNITY)
Admission: EM | Admit: 2018-01-03 | Discharge: 2018-01-04 | Disposition: A | Discharge status: Home / Self Care | Payer: Medicaid Other | Source: Home / Self Care | Attending: Emergency Medicine | Admitting: Emergency Medicine

## 2018-01-03 ENCOUNTER — Encounter (HOSPITAL_COMMUNITY): Payer: Self-pay

## 2018-01-03 ENCOUNTER — Encounter (HOSPITAL_COMMUNITY): Payer: Self-pay | Admitting: Emergency Medicine

## 2018-01-03 DIAGNOSIS — R1010 Upper abdominal pain, unspecified: Secondary | ICD-10-CM

## 2018-01-03 DIAGNOSIS — R112 Nausea with vomiting, unspecified: Secondary | ICD-10-CM | POA: Insufficient documentation

## 2018-01-03 DIAGNOSIS — R072 Precordial pain: Secondary | ICD-10-CM | POA: Insufficient documentation

## 2018-01-03 DIAGNOSIS — R1084 Generalized abdominal pain: Secondary | ICD-10-CM | POA: Insufficient documentation

## 2018-01-03 DIAGNOSIS — R600 Localized edema: Secondary | ICD-10-CM | POA: Insufficient documentation

## 2018-01-03 DIAGNOSIS — R0602 Shortness of breath: Secondary | ICD-10-CM | POA: Insufficient documentation

## 2018-01-03 LAB — COMPREHENSIVE METABOLIC PANEL
ALK PHOS: 109 U/L (ref 38–126)
ALK PHOS: 91 U/L (ref 38–126)
ALT: 24 U/L (ref 14–54)
ALT: 40 U/L (ref 14–54)
ANION GAP: 11 (ref 5–15)
AST: 21 U/L (ref 15–41)
AST: 56 U/L — ABNORMAL HIGH (ref 15–41)
Albumin: 3.9 g/dL (ref 3.5–5.0)
Albumin: 3.9 g/dL (ref 3.5–5.0)
Anion gap: 10 (ref 5–15)
BILIRUBIN TOTAL: 0.4 mg/dL (ref 0.3–1.2)
BUN: 16 mg/dL (ref 6–20)
BUN: 12 mg/dL (ref 6–20)
CALCIUM: 8.8 mg/dL — AB (ref 8.9–10.3)
CALCIUM: 8.9 mg/dL (ref 8.9–10.3)
CO2: 23 mmol/L (ref 22–32)
CO2: 22 mmol/L (ref 22–32)
CREATININE: 0.73 mg/dL (ref 0.44–1.00)
Chloride: 108 mmol/L (ref 101–111)
Chloride: 107 mmol/L (ref 101–111)
Creatinine, Ser: 0.72 mg/dL (ref 0.44–1.00)
Glucose, Bld: 105 mg/dL — ABNORMAL HIGH (ref 65–99)
Glucose, Bld: 95 mg/dL (ref 65–99)
POTASSIUM: 3.6 mmol/L (ref 3.5–5.1)
Potassium: 3.4 mmol/L — ABNORMAL LOW (ref 3.5–5.1)
Sodium: 141 mmol/L (ref 135–145)
Sodium: 140 mmol/L (ref 135–145)
TOTAL PROTEIN: 7 g/dL (ref 6.5–8.1)
Total Bilirubin: 0.3 mg/dL (ref 0.3–1.2)
Total Protein: 7 g/dL (ref 6.5–8.1)

## 2018-01-03 LAB — CBC WITH DIFFERENTIAL/PLATELET
BASOS PCT: 1 %
Basophils Absolute: 0.1 10*3/uL (ref 0.0–0.1)
Basophils Absolute: 0 10*3/uL (ref 0.0–0.1)
Basophils Relative: 0 %
EOS ABS: 0.3 10*3/uL (ref 0.0–0.7)
EOS PCT: 3 %
Eosinophils Absolute: 0.3 10*3/uL (ref 0.0–0.7)
Eosinophils Relative: 3 %
HCT: 37 % (ref 36.0–46.0)
HEMATOCRIT: 37 % (ref 36.0–46.0)
HEMOGLOBIN: 12.2 g/dL (ref 12.0–15.0)
Hemoglobin: 12.3 g/dL (ref 12.0–15.0)
LYMPHS ABS: 3 10*3/uL (ref 0.7–4.0)
Lymphocytes Relative: 45 %
Lymphocytes Relative: 33 %
Lymphs Abs: 4.4 10*3/uL — ABNORMAL HIGH (ref 0.7–4.0)
MCH: 30.2 pg (ref 26.0–34.0)
MCH: 29.8 pg (ref 26.0–34.0)
MCHC: 33.2 g/dL (ref 30.0–36.0)
MCHC: 33 g/dL (ref 30.0–36.0)
MCV: 90.9 fL (ref 78.0–100.0)
MCV: 90.2 fL (ref 78.0–100.0)
MONO ABS: 0.3 10*3/uL (ref 0.1–1.0)
MONOS PCT: 4 %
MONOS PCT: 4 %
Monocytes Absolute: 0.4 10*3/uL (ref 0.1–1.0)
NEUTROS ABS: 5.3 10*3/uL (ref 1.7–7.7)
NEUTROS PCT: 47 %
Neutro Abs: 4.6 10*3/uL (ref 1.7–7.7)
Neutrophils Relative %: 60 %
PLATELETS: 279 10*3/uL (ref 150–400)
Platelets: 267 10*3/uL (ref 150–400)
RBC: 4.07 MIL/uL (ref 3.87–5.11)
RBC: 4.1 MIL/uL (ref 3.87–5.11)
RDW: 12.8 % (ref 11.5–15.5)
RDW: 12.9 % (ref 11.5–15.5)
WBC: 9.7 10*3/uL (ref 4.0–10.5)
WBC: 8.8 10*3/uL (ref 4.0–10.5)

## 2018-01-03 LAB — I-STAT BETA HCG BLOOD, ED (MC, WL, AP ONLY): I-stat hCG, quantitative: <5 m[IU]/mL (ref <5)

## 2018-01-03 LAB — URINALYSIS, ROUTINE W REFLEX MICROSCOPIC
BILIRUBIN URINE: NEGATIVE
Glucose, UA: NEGATIVE mg/dL
Hgb urine dipstick: NEGATIVE
Ketones, ur: 5 mg/dL — AB
LEUKOCYTES UA: NEGATIVE
NITRITE: NEGATIVE
PH: 6 (ref 5.0–8.0)
PROTEIN: NEGATIVE mg/dL
Specific Gravity, Urine: >1.046 — ABNORMAL HIGH (ref 1.005–1.030)

## 2018-01-03 LAB — TROPONIN I

## 2018-01-03 LAB — LIPASE, BLOOD
LIPASE: 28 U/L (ref 11–51)
LIPASE: 22 U/L (ref 11–51)

## 2018-01-03 MED ORDER — FAMOTIDINE IN NACL 20-0.9 MG/50ML-% IV SOLN
20.0000 mg | Freq: Once | INTRAVENOUS | Status: AC
  Administered 2018-01-03: 20 mg via INTRAVENOUS
  Filled 2018-01-03: qty 50

## 2018-01-03 MED ORDER — PROMETHAZINE HCL 25 MG/ML IJ SOLN
12.5000 mg | Freq: Once | INTRAMUSCULAR | Status: AC
  Administered 2018-01-04: 12.5 mg via INTRAVENOUS
  Filled 2018-01-03: qty 1

## 2018-01-03 MED ORDER — MORPHINE SULFATE (PF) 4 MG/ML IV SOLN
4.0000 mg | Freq: Once | INTRAVENOUS | Status: AC
  Administered 2018-01-03: 4 mg via INTRAVENOUS
  Filled 2018-01-03: qty 1

## 2018-01-03 MED ORDER — SODIUM CHLORIDE 0.9 % IV BOLUS
1000.0000 mL | Freq: Once | INTRAVENOUS | Status: AC
  Administered 2018-01-03: 1000 mL via INTRAVENOUS

## 2018-01-03 MED ORDER — ONDANSETRON 8 MG PO TBDP
8.0000 mg | ORAL_TABLET | Freq: Once | ORAL | Status: AC
  Administered 2018-01-03: 8 mg via ORAL
  Filled 2018-01-03: qty 1

## 2018-01-03 MED ORDER — IOPAMIDOL (ISOVUE-300) INJECTION 61%
INTRAVENOUS | Status: AC
  Filled 2018-01-03: qty 100

## 2018-01-03 MED ORDER — HYDROCODONE-ACETAMINOPHEN 5-325 MG PO TABS
1.0000 | ORAL_TABLET | Freq: Once | ORAL | Status: AC
  Administered 2018-01-03: 1 via ORAL
  Filled 2018-01-03: qty 1

## 2018-01-03 MED ORDER — METOCLOPRAMIDE HCL 5 MG/ML IJ SOLN
5.0000 mg | Freq: Once | INTRAMUSCULAR | Status: AC
  Administered 2018-01-03: 5 mg via INTRAVENOUS
  Filled 2018-01-03: qty 2

## 2018-01-03 MED ORDER — IOPAMIDOL (ISOVUE-300) INJECTION 61%
50.0000 mL | Freq: Once | INTRAVENOUS | Status: AC | PRN
  Administered 2018-01-03: 50 mL via INTRAVENOUS

## 2018-01-03 MED ORDER — ONDANSETRON HCL 4 MG/2ML IJ SOLN
4.0000 mg | Freq: Once | INTRAMUSCULAR | Status: AC
  Administered 2018-01-03: 4 mg via INTRAVENOUS
  Filled 2018-01-03: qty 2

## 2018-01-03 NOTE — ED Provider Notes (Addendum)
[facility] EMERGENCY DEPARTMENT Provider Note   CSN: [id number] Arrival date & time: 01/03/18  1206   Patient speaks little English.  History is obtained from professional medical interpreter  History   Chief Complaint Chief Complaint  Patient presents with  . Abdominal Pain    HPI Lorraine [name] is a 32 y.o. female.  History is obtained from professional medical interpreter patient speaks no English complains of epigastric pain and left upper quadrant pain onset yesterday morning constant worse with eating.  She vomited 3 times this morning other associated symptoms clear temperature of 101 degrees this morning.  She treated herself with ibuprofen 8 AM today.  Nothing improves pain.  Pain is nonradiating.  No shortness of breath no chest pain she is presently nauseated.  Pain is also exacerbated by vomiting feels like pancreatitis she has had in the past.  Other associated symptoms include mild dysuria.  Though she denies vaginal discharge.  Last normal menstrual period December 21, 2017.  Last bowel movement this morning, normal  HPI  Past Medical History:  Diagnosis Date  . Asthma    Inhaler used 01/02/16  . Diverticulitis   . Nausea & vomiting 04/02/2017  . Pancreatitis   . Pre-diabetes   . Pyelonephritis   . Pyelonephritis     Patient Active Problem List   Diagnosis Date Noted  . Acute pancreatitis 04/02/2017  . Abdominal pain, acute, left upper quadrant 04/02/2017  . Intractable nausea and vomiting 04/02/2017  . Asthma in adult 04/02/2017  . Obesity (BMI 30.0-34.9) 04/02/2017  . Fatty infiltration of liver 04/02/2017  . Gastroesophageal reflux disease without esophagitis   . Pre-diabetes 07/15/2015  . Diarrhea   . Hypokalemia   . Bacterial vaginosis   . Pyelonephritis 07/07/2015  . Abdominal pain, right upper quadrant   . Asthma 10/31/2014  . History of preterm delivery 10/22/2014  . Status post repeat low transverse cesarean section 09/06/2014   . Diverticulitis 09/10/2012    Past Surgical History:  Procedure Laterality Date  . CESAREAN SECTION    . CESAREAN SECTION N/A 2006  . CESAREAN SECTION N/A 09/03/2014   Procedure: CESAREAN SECTION;  Surgeon: Guss Bunde, MD;  Location: [facility];  Service: Obstetrics;  Laterality: N/A;  . CHOLECYSTECTOMY    . ESOPHAGOGASTRODUODENOSCOPY (EGD) WITH PROPOFOL Left 04/07/2017   Procedure: ESOPHAGOGASTRODUODENOSCOPY (EGD) WITH PROPOFOL;  Surgeon: Ronnette Juniper, MD;  Location: [place];  Service: Gastroenterology;  Laterality: Left;  . TUBAL LIGATION       OB History    Gravida  5   Para  3   Term  1   Preterm  2   AB  2   Living  3     SAB  2   TAB      Ectopic      Multiple  1   Live Births  3            Home Medications    Prior to Admission medications   Medication Sig Start Date End Date Taking? Authorizing Provider  albuterol (PROVENTIL HFA;VENTOLIN HFA) 108 (90 Base) MCG/ACT inhaler Inhale 2 puffs into the lungs every 6 (six) hours as needed for wheezing or shortness of breath.    [provider]  dicyclomine (BENTYL) 20 MG tablet Take 1 tablet (20 mg total) by mouth every 12 (twelve) hours as needed (for abdominal pain/cramping). Patient not taking: Reported on 01/03/2018 12/22/17   Antonietta Breach, PA-C  HYDROcodone-acetaminophen [facility]) 5-325 MG tablet  Take 1 tablet by mouth every 6 (six) hours as needed for severe pain. Labeled in Spanish Patient not taking: Reported on 12/21/2017 10/14/17   Orlie Dakin, MD  ibuprofen (ADVIL,MOTRIN) 600 MG tablet Take 1 tablet (600 mg total) by mouth every 6 (six) hours as needed. Patient not taking: Reported on 12/21/2017 10/10/17   Luvenia Redden, PA-C  metroNIDAZOLE (FLAGYL) 500 MG tablet Take 1 tablet (500 mg total) by mouth 2 (two) times daily. Patient not taking: Reported on 12/21/2017 10/10/17   Luvenia Redden, PA-C  metroNIDAZOLE (METROGEL VAGINAL) 0.75 % vaginal gel Place 1 Applicatorful vaginally 2 (two)  times daily. Label in  spanish Patient not taking: Reported on 12/21/2017 10/14/17   Orlie Dakin, MD  ondansetron (ZOFRAN) 8 MG tablet Take 1 tablet (8 mg total) by mouth every 8 (eight) hours as needed for nausea. Patient not taking: Reported on 12/21/2017 10/14/17   Orlie Dakin, MD  promethazine (PHENERGAN) 25 MG tablet Take 1 tablet (25 mg total) by mouth every 6 (six) hours as needed for nausea or vomiting. Patient not taking: Reported on 01/03/2018 12/22/17   Antonietta Breach, PA-C    Family History Family History  Problem Relation Age of Onset  . Hyperlipidemia Mother   . Diabetes Maternal Uncle   . Diabetes Paternal Grandmother     Social History Social History   Tobacco Use  . Smoking status: Never Smoker  . Smokeless tobacco: Never Used  Substance Use Topics  . Alcohol use: No  . Drug use: No     Allergies   Patient has no known allergies.   Review of Systems Review of Systems  Constitutional: Positive for fever.  HENT: Negative.   Respiratory: Negative.   Cardiovascular: Negative.   Gastrointestinal: Positive for abdominal pain, nausea and vomiting.  Genitourinary: Positive for dysuria.  Musculoskeletal: Negative.   Skin: Negative.   Neurological: Negative.   Psychiatric/Behavioral: Negative.   All other systems reviewed and are negative.    Physical Exam Updated Vital Signs BP 128/81   Pulse 71   Temp 98.2 F (36.8 C) (Oral)   Resp 18   LMP 12/21/2017   SpO2 96%   Physical Exam  Constitutional: She appears well-developed and well-nourished.  HENT:  Head: Normocephalic and atraumatic.  Eyes: Pupils are equal, round, and reactive to light. Conjunctivae are normal.  Neck: Neck supple. No tracheal deviation present. No thyromegaly present.  Cardiovascular: Normal rate and regular rhythm.  No murmur heard. Pulmonary/Chest: Effort normal and breath sounds normal.  Abdominal: Soft. Bowel sounds are normal. She exhibits no distension. There is  tenderness. There is no rebound and no guarding.  Obese.  Tender at epigastrium and left upper quadrant  Musculoskeletal: Normal range of motion. She exhibits no edema or tenderness.  Neurological: She is alert. Coordination normal.  Skin: Skin is warm and dry. Capillary refill takes less than 2 seconds. No rash noted.  Psychiatric: She has a normal mood and affect.  Nursing note and vitals reviewed.    ED Treatments / Results  Labs (all labs ordered are listed, but only abnormal results are displayed) Labs Reviewed  COMPREHENSIVE METABOLIC PANEL - Abnormal; Notable for the following components:      Result Value   AST 56 (*)    All other components within normal limits  CBC WITH DIFFERENTIAL/PLATELET  LIPASE, BLOOD  URINALYSIS, ROUTINE W REFLEX MICROSCOPIC  I-STAT BETA HCG BLOOD, ED ([facility], [facility], [facility] ONLY)   ED ECG REPORT   Date:  01/03/2018  Rate: 95  Rhythm: normal sinus rhythm  QRS Axis: normal  Intervals: normal  ST/T Wave abnormalities: nonspecific T wave changes  Conduction Disutrbances:none  Narrative Interpretation:   Old EKG Reviewed: unchanged Unchanged from 11/13/2017 I have personally reviewed the EKG tracing and agree with the computerized printout as noted. EKG None  x-ray reviewed by me Radiology Dg Chest 2 View  Result Date: 01/03/2018 CLINICAL DATA:  Acute onset of mid chest pain. EXAM: CHEST - 2 VIEW COMPARISON:  Chest radiograph performed 11/13/2017 FINDINGS: The lungs are well-aerated and clear. There is no evidence of focal opacification, pleural effusion or pneumothorax. The heart is normal in size; the mediastinal contour is within normal limits. No acute osseous abnormalities are seen. Clips are noted within the right upper quadrant, reflecting prior cholecystectomy. IMPRESSION: No acute cardiopulmonary process seen. Electronically Signed   By: Garald Balding M.D.   On: 01/03/2018 05:47   Ct Abdomen Pelvis W Contrast  Result Date: 01/03/2018 CLINICAL  DATA:  Left upper and right lower quadrant abdominal pain. EXAM: CT ABDOMEN AND PELVIS WITH CONTRAST TECHNIQUE: Multidetector CT imaging of the abdomen and pelvis was performed using the standard protocol following bolus administration of intravenous contrast. CONTRAST:  53mL ISOVUE-300 IOPAMIDOL (ISOVUE-300) INJECTION 61% COMPARISON:  09/07/2017 FINDINGS: LOWER CHEST: No basilar pulmonary nodules or pleural effusion. No apical pericardial effusion. HEPATOBILIARY: Normal hepatic contours and density. No intra- or extrahepatic biliary dilatation. Status post cholecystectomy. PANCREAS: Normal parenchymal contours without ductal dilatation. No peripancreatic fluid collection. SPLEEN: Normal. ADRENALS/URINARY TRACT: --Adrenal glands: Normal. --Right kidney/ureter: No hydronephrosis, nephroureterolithiasis, perinephric stranding or solid renal mass. --Left kidney/ureter: No hydronephrosis, nephroureterolithiasis, perinephric stranding or solid renal mass. --Urinary bladder: Normal for degree of distention STOMACH/BOWEL: --Stomach/Duodenum: No hiatal hernia or other gastric abnormality. Normal duodenal course. --Small bowel: No dilatation or inflammation. --Colon: No focal abnormality. --Appendix: Normal. VASCULAR/LYMPHATIC: Normal course and caliber of the major abdominal vessels. No abdominal or pelvic lymphadenopathy. REPRODUCTIVE: Normal uterus and ovaries. Status post left tubal ligation. There is a second clip adjacent to the uterine fundus. MUSCULOSKELETAL. No bony spinal canal stenosis or focal osseous abnormality. OTHER: None. IMPRESSION: 1. No acute abnormality of the abdomen or pelvis. 2. Unchanged displaced appearance of the right tubal ligation clip. Electronically Signed   By: Ulyses Jarred M.D.   On: 01/03/2018 19:03    Procedures Procedures (including critical care time)  Medications Ordered in ED Medications  iopamidol (ISOVUE-300) 61 % injection (has no administration in time range)  sodium  chloride 0.9 % bolus 1,000 mL (has no administration in time range)  metoCLOPramide (REGLAN) injection 5 mg (has no administration in time range)  morphine 4 MG/ML injection 4 mg (has no administration in time range)  famotidine (PEPCID) IVPB 20 mg premix (has no administration in time range)  iopamidol (ISOVUE-300) 61 % injection 50 mL (50 mLs Intravenous Contrast Given 01/03/18 1828)   Results for orders placed or performed during the hospital encounter of 01/03/18  CBC with Differential/Platelet  Result Value Ref Range   WBC 8.8 4.0 - 10.5 K/uL   RBC 4.10 3.87 - 5.11 MIL/uL   Hemoglobin 12.2 12.0 - 15.0 g/dL   HCT 37.0 36.0 - 46.0 %   MCV 90.2 78.0 - 100.0 fL   MCH 29.8 26.0 - 34.0 pg   MCHC 33.0 30.0 - 36.0 g/dL   RDW 12.9 11.5 - 15.5 %   Platelets 267 150 - 400 K/uL   Neutrophils Relative % 60 %  Neutro Abs 5.3 1.7 - 7.7 K/uL   Lymphocytes Relative 33 %   Lymphs Abs 3.0 0.7 - 4.0 K/uL   Monocytes Relative 4 %   Monocytes Absolute 0.3 0.1 - 1.0 K/uL   Eosinophils Relative 3 %   Eosinophils Absolute 0.3 0.0 - 0.7 K/uL   Basophils Relative 0 %   Basophils Absolute 0.0 0.0 - 0.1 K/uL  Comprehensive metabolic panel  Result Value Ref Range   Sodium 140 135 - 145 mmol/L   Potassium 3.6 3.5 - 5.1 mmol/L   Chloride 107 101 - 111 mmol/L   CO2 22 22 - 32 mmol/L   Glucose, Bld 95 65 - 99 mg/dL   BUN 12 6 - 20 mg/dL   Creatinine, Ser 0.72 0.44 - 1.00 mg/dL   Calcium 8.9 8.9 - 10.3 mg/dL   Total Protein 7.0 6.5 - 8.1 g/dL   Albumin 3.9 3.5 - 5.0 g/dL   AST 56 (H) 15 - 41 U/L   ALT 40 14 - 54 U/L   Alkaline Phosphatase 91 38 - 126 U/L   Total Bilirubin 0.4 0.3 - 1.2 mg/dL   GFR calc non Af Amer >60 >60 mL/min   GFR calc Af Amer >60 >60 mL/min   Anion gap 11 5 - 15  Lipase, blood  Result Value Ref Range   Lipase 22 11 - 51 U/L  Urinalysis, Routine w reflex microscopic  Result Value Ref Range   Color, Urine YELLOW YELLOW   APPearance HAZY (A) CLEAR   Specific Gravity, Urine  >1.046 (H) 1.005 - 1.030   pH 6.0 5.0 - 8.0   Glucose, UA NEGATIVE NEGATIVE mg/dL   Hgb urine dipstick NEGATIVE NEGATIVE   Bilirubin Urine NEGATIVE NEGATIVE   Ketones, ur 5 (A) NEGATIVE mg/dL   Protein, ur NEGATIVE NEGATIVE mg/dL   Nitrite NEGATIVE NEGATIVE   Leukocytes, UA NEGATIVE NEGATIVE  I-Stat beta hCG blood, ED  Result Value Ref Range   I-stat hCG, quantitative <5.0 <5 mIU/mL   Comment 3           Dg Chest 2 View  Result Date: 01/03/2018 CLINICAL DATA:  Acute onset of mid chest pain. EXAM: CHEST - 2 VIEW COMPARISON:  Chest radiograph performed 11/13/2017 FINDINGS: The lungs are well-aerated and clear. There is no evidence of focal opacification, pleural effusion or pneumothorax. The heart is normal in size; the mediastinal contour is within normal limits. No acute osseous abnormalities are seen. Clips are noted within the right upper quadrant, reflecting prior cholecystectomy. IMPRESSION: No acute cardiopulmonary process seen. Electronically Signed   By: Garald Balding M.D.   On: 01/03/2018 05:47   Ct Abdomen Pelvis W Contrast  Result Date: 01/03/2018 CLINICAL DATA:  Left upper and right lower quadrant abdominal pain. EXAM: CT ABDOMEN AND PELVIS WITH CONTRAST TECHNIQUE: Multidetector CT imaging of the abdomen and pelvis was performed using the standard protocol following bolus administration of intravenous contrast. CONTRAST:  49mL ISOVUE-300 IOPAMIDOL (ISOVUE-300) INJECTION 61% COMPARISON:  09/07/2017 FINDINGS: LOWER CHEST: No basilar pulmonary nodules or pleural effusion. No apical pericardial effusion. HEPATOBILIARY: Normal hepatic contours and density. No intra- or extrahepatic biliary dilatation. Status post cholecystectomy. PANCREAS: Normal parenchymal contours without ductal dilatation. No peripancreatic fluid collection. SPLEEN: Normal. ADRENALS/URINARY TRACT: --Adrenal glands: Normal. --Right kidney/ureter: No hydronephrosis, nephroureterolithiasis, perinephric stranding or solid  renal mass. --Left kidney/ureter: No hydronephrosis, nephroureterolithiasis, perinephric stranding or solid renal mass. --Urinary bladder: Normal for degree of distention STOMACH/BOWEL: --Stomach/Duodenum: No hiatal hernia or other gastric abnormality.  Normal duodenal course. --Small bowel: No dilatation or inflammation. --Colon: No focal abnormality. --Appendix: Normal. VASCULAR/LYMPHATIC: Normal course and caliber of the major abdominal vessels. No abdominal or pelvic lymphadenopathy. REPRODUCTIVE: Normal uterus and ovaries. Status post left tubal ligation. There is a second clip adjacent to the uterine fundus. MUSCULOSKELETAL. No bony spinal canal stenosis or focal osseous abnormality. OTHER: None. IMPRESSION: 1. No acute abnormality of the abdomen or pelvis. 2. Unchanged displaced appearance of the right tubal ligation clip. Electronically Signed   By: Ulyses Jarred M.D.   On: 01/03/2018 19:03    Initial Impression / Assessment and Plan / ED Course  I have reviewed the triage vital signs and the nursing notes.  Pertinent labs & imaging results that were available during my care of the patient were reviewed by me and considered in my medical decision making (see chart for details).     1:40 AM after treatment with multiple doses of antiemetics, intravenous fluids and intravenous morphine her pain and nausea are under control.  She is able to drink water without vomiting she feels ready for discharge.  Plan referral primary care clinic.  Prescription Reglan. Instructions were gone over with the patient using professional medical Spanish interpreter Final Clinical Impressions(s) / ED Diagnoses  dx #1 upper abdominal pain #2 nausea and vomiting Final diagnoses:  None    ED Discharge Orders    None       Orlie Dakin, MD 01/04/18 2229    Orlie Dakin, MD 01/04/18 [phone]

## 2018-01-03 NOTE — ED Triage Notes (Signed)
Pt arriving with chest pain that started today. Denies N/V and shortness of breath.

## 2018-01-03 NOTE — ED Provider Notes (Signed)
[facility] DEPT Provider Note   CSN: [id number] Arrival date & time: 01/03/18  0023     History   Chief Complaint Chief Complaint  Patient presents with  . Chest Pain    HPI Lorraine [name] is a 32 y.o. female.  The history is provided by the patient. A language interpreter was used [phone]).  Chest Pain   This is a new problem. The current episode started 12 to 24 hours ago. The problem occurs constantly. Pain location: Right chest. The pain is moderate. The quality of the pain is described as pressure-like. Radiates to: Abdomen. Associated symptoms include abdominal pain, back pain, a fever, lower extremity edema, shortness of breath and vomiting. She has tried nothing for the symptoms.   Patient history of asthma, chronic abdominal pain presents with chest pain that radiates to the abdomen.  She reports nausea/vomiting.  She reports fever.  Reports back pain.  She reports lower extremity edema No pleuritic pain.  She does not take oral contraceptives.  No recent travel.  She is a non-smoker. Past Medical History:  Diagnosis Date  . Asthma    Inhaler used 01/02/16  . Diverticulitis   . Nausea & vomiting 04/02/2017  . Pancreatitis   . Pre-diabetes   . Pyelonephritis   . Pyelonephritis     Patient Active Problem List   Diagnosis Date Noted  . Acute pancreatitis 04/02/2017  . Abdominal pain, acute, left upper quadrant 04/02/2017  . Intractable nausea and vomiting 04/02/2017  . Asthma in adult 04/02/2017  . Obesity (BMI 30.0-34.9) 04/02/2017  . Fatty infiltration of liver 04/02/2017  . Gastroesophageal reflux disease without esophagitis   . Pre-diabetes 07/15/2015  . Diarrhea   . Hypokalemia   . Bacterial vaginosis   . Pyelonephritis 07/07/2015  . Abdominal pain, right upper quadrant   . Asthma 10/31/2014  . History of preterm delivery 10/22/2014  . Status post repeat low transverse cesarean section 09/06/2014  . Diverticulitis  09/10/2012    Past Surgical History:  Procedure Laterality Date  . CESAREAN SECTION    . CESAREAN SECTION N/A 2006  . CESAREAN SECTION N/A 09/03/2014   Procedure: CESAREAN SECTION;  Surgeon: Guss Bunde, MD;  Location: [facility];  Service: Obstetrics;  Laterality: N/A;  . CHOLECYSTECTOMY    . ESOPHAGOGASTRODUODENOSCOPY (EGD) WITH PROPOFOL Left 04/07/2017   Procedure: ESOPHAGOGASTRODUODENOSCOPY (EGD) WITH PROPOFOL;  Surgeon: Ronnette Juniper, MD;  Location: [facility];  Service: Gastroenterology;  Laterality: Left;  . TUBAL LIGATION       OB History    Gravida  5   Para  3   Term  1   Preterm  2   AB  2   Living  3     SAB  2   TAB      Ectopic      Multiple  1   Live Births  3            Home Medications    Prior to Admission medications   Medication Sig Start Date End Date Taking? Authorizing Provider  albuterol (PROVENTIL HFA;VENTOLIN HFA) 108 (90 Base) MCG/ACT inhaler Inhale 2 puffs into the lungs every 6 (six) hours as needed for wheezing or shortness of breath.   Yes [provider]  dicyclomine (BENTYL) 20 MG tablet Take 1 tablet (20 mg total) by mouth every 12 (twelve) hours as needed (for abdominal pain/cramping). Patient not taking: Reported on 01/03/2018 12/22/17   Antonietta Breach, PA-C  HYDROcodone-acetaminophen [facility])  5-325 MG tablet Take 1 tablet by mouth every 6 (six) hours as needed for severe pain. Labeled in Spanish Patient not taking: Reported on 12/21/2017 10/14/17   Orlie Dakin, MD  ibuprofen (ADVIL,MOTRIN) 600 MG tablet Take 1 tablet (600 mg total) by mouth every 6 (six) hours as needed. Patient not taking: Reported on 12/21/2017 10/10/17   Luvenia Redden, PA-C  metroNIDAZOLE (FLAGYL) 500 MG tablet Take 1 tablet (500 mg total) by mouth 2 (two) times daily. Patient not taking: Reported on 12/21/2017 10/10/17   Luvenia Redden, PA-C  metroNIDAZOLE (METROGEL VAGINAL) 0.75 % vaginal gel Place 1 Applicatorful vaginally 2 (two) times daily. Label  in  spanish Patient not taking: Reported on 12/21/2017 10/14/17   Orlie Dakin, MD  ondansetron (ZOFRAN) 8 MG tablet Take 1 tablet (8 mg total) by mouth every 8 (eight) hours as needed for nausea. Patient not taking: Reported on 12/21/2017 10/14/17   Orlie Dakin, MD  promethazine (PHENERGAN) 25 MG tablet Take 1 tablet (25 mg total) by mouth every 6 (six) hours as needed for nausea or vomiting. Patient not taking: Reported on 01/03/2018 12/22/17   Antonietta Breach, PA-C    Family History Family History  Problem Relation Age of Onset  . Hyperlipidemia Mother   . Diabetes Maternal Uncle   . Diabetes Paternal Grandmother     Social History Social History   Tobacco Use  . Smoking status: Never Smoker  . Smokeless tobacco: Never Used  Substance Use Topics  . Alcohol use: No  . Drug use: No     Allergies   Patient has no known allergies.   Review of Systems Review of Systems  Constitutional: Positive for fever.  Respiratory: Positive for shortness of breath.   Cardiovascular: Positive for chest pain and leg swelling.  Gastrointestinal: Positive for abdominal pain and vomiting.  Musculoskeletal: Positive for back pain.  All other systems reviewed and are negative.    Physical Exam Updated Vital Signs BP 104/64 (BP Location: Left Arm) Comment: Simultaneous filing. User may not have seen previous data.  Pulse 67 Comment: Simultaneous filing. User may not have seen previous data.  Temp 98.6 F (37 C) (Oral)   Resp 12 Comment: Simultaneous filing. User may not have seen previous data.  Ht 1.524 m (5')   Wt 83 kg (183 lb)   LMP 12/21/2017   SpO2 99% Comment: Simultaneous filing. User may not have seen previous data.  BMI 35.74 kg/m   Physical Exam CONSTITUTIONAL: Well developed/well nourished HEAD: Normocephalic/atraumatic EYES: EOMI/PERRL ENMT: Mucous membranes moist NECK: supple no meningeal signs SPINE/BACK:entire spine nontender CV: S1/S2 noted, no  murmurs/rubs/gallops noted Chest-no tenderness noted LUNGS: Lungs are clear to auscultation bilaterally, no apparent distress ABDOMEN: soft, diffuse abdominal tenderness, no rebound or guarding, bowel sounds noted throughout abdomen GU:no cva tenderness NEURO: Pt is awake/alert/appropriate, moves all extremitiesx4.  No facial droop.   EXTREMITIES: pulses normal/equal, full ROM, no lower extremity edema/tenderness SKIN: warm, color normal PSYCH: no abnormalities of mood noted, alert and oriented to situation   ED Treatments / Results  Labs (all labs ordered are listed, but only abnormal results are displayed) Labs Reviewed  COMPREHENSIVE METABOLIC PANEL - Abnormal; Notable for the following components:      Result Value   Potassium 3.4 (*)    Glucose, Bld 105 (*)    Calcium 8.8 (*)    All other components within normal limits  CBC WITH DIFFERENTIAL/PLATELET - Abnormal; Notable for the following components:   Lymphs  Abs 4.4 (*)    All other components within normal limits  TROPONIN I  LIPASE, BLOOD  I-STAT BETA HCG BLOOD, ED ([facility], [facility], [facility] ONLY)    EKG EKG Interpretation  Date/Time:  Friday January 03 2018 00:53:22 EDT Ventricular Rate:  94 PR Interval:    QRS Duration: 109 QT Interval:  359 QTC Calculation: 449 R Axis:   32 Text Interpretation:  Sinus rhythm Low voltage, precordial leads Borderline T abnormalities, anterior leads Baseline wander in lead(s) V4 Interpretation limited secondary to artifact Confirmed by Ripley Fraise [phone]) on 01/03/2018 1:12:06 AM   Radiology Dg Chest 2 View  Result Date: 01/03/2018 CLINICAL DATA:  Acute onset of mid chest pain. EXAM: CHEST - 2 VIEW COMPARISON:  Chest radiograph performed 11/13/2017 FINDINGS: The lungs are well-aerated and clear. There is no evidence of focal opacification, pleural effusion or pneumothorax. The heart is normal in size; the mediastinal contour is within normal limits. No acute osseous abnormalities are seen.  Clips are noted within the right upper quadrant, reflecting prior cholecystectomy. IMPRESSION: No acute cardiopulmonary process seen. Electronically Signed   By: Garald Balding M.D.   On: 01/03/2018 05:47    Procedures Procedures  Medications Ordered in ED Medications  HYDROcodone-acetaminophen (NORCO/VICODIN) 5-325 MG per tablet 1 tablet (1 tablet Oral Given 01/03/18 0502)  ondansetron (ZOFRAN-ODT) disintegrating tablet 8 mg (8 mg Oral Given 01/03/18 0502)     Initial Impression / Assessment and Plan / ED Course  I have reviewed the triage vital signs and the nursing notes.  Pertinent labs & imaging results that were available during my care of the patient were reviewed by me and considered in my medical decision making (see chart for details).     Patient initially reports she had chest pain.  But on evaluation she kept talking about her abdominal pain.  She has had multiple evaluations for abdominal pain previously, and she reports this is similar to prior episodes.  Her chest pain improved while in the emergency department.  She reported the pain was mostly pressure-like.  There is no hypoxia.  She is low risk for PE, my suspicion for ACS/PE/dissection is low.  Her abdominal exam was unremarkable.  I utilized the [place] interpreter for her discharge instructions. I reviewed multiple old EKGs, there was no significant change. Final Clinical Impressions(s) / ED Diagnoses   Final diagnoses:  Precordial pain  Generalized abdominal pain    ED Discharge Orders    None       Ripley Fraise, MD 01/03/18 [phone]

## 2018-01-03 NOTE — ED Notes (Signed)
Triage  RN Aaron Edelman called and notified that CT called and that Pt needs IV started

## 2018-01-03 NOTE — ED Notes (Signed)
Pt vomiting. edp notified

## 2018-01-03 NOTE — ED Triage Notes (Signed)
Pt reporting epigastric abdominal pain. Pt reports the pain started last night. She states she is unable to eat due to abdominal pain. Pt also reports n/v. Triage completed using [employer] interpreter.

## 2018-01-03 NOTE — ED Provider Notes (Signed)
Patient placed in Quick Look pathway, seen and evaluated   Chief Complaint: LUQ, RLQ abdominal pain  HPI: Patient presents to ED for evaluation of acute onset left upper quadrant and right lower quadrant abdominal pain since last night.  Symptoms were unprovoked.  She also reports several episodes of emesis that were nonbloody and nonbilious.  Reports normal bowel movement yesterday.  Has not taking any medications prior to arrival to help with symptoms.  States this feels like her prior pancreatitis exacerbations or a kidney stone.  Reports dysuria but denies any hematuria, diarrhea, vaginal complaints, fever.  ROS: Abdominal pain  Physical Exam:   Gen: No distress  Neuro: Awake and Alert  Skin: Warm    Focused Exam: Tenderness to palpation of the right lower quadrant, left upper quadrant epigastric area with no rebound or guarding present   Initiation of care has begun. The patient has been counseled on the process, plan, and necessity for staying for the completion/evaluation, and the remainder of the medical screening examination    Delia Heady, PA-C 01/03/18 1326    Long, Wonda Old[facility], MD 01/04/18 [phone]

## 2018-01-04 MED ORDER — METOCLOPRAMIDE HCL 10 MG PO TABS: 10.0000 mg | ORAL_TABLET | Freq: Four times a day (QID) | ORAL | 0 refills | Status: DC | PRN

## 2018-01-04 MED ORDER — HALOPERIDOL LACTATE 5 MG/ML IJ SOLN
5.0000 mg | Freq: Once | INTRAMUSCULAR | Status: AC
  Administered 2018-01-04: 5 mg via INTRAVENOUS
  Filled 2018-01-04: qty 1

## 2018-01-04 NOTE — Discharge Instructions (Signed)
Take the medication prescribed as needed for nausea.  Call your clinic on Monday, 01/06/2018 to schedule the next available appointment.  You may need referral to gastroenterologist.Make sure that you drink at least six 8 ounce glasses of water or Gatorade each day in order to stay well-hydrated.  Return if your nausea is not well controlled with the medication prescribed or if your condition worsens for any reason

## 2018-02-02 ENCOUNTER — Emergency Department (HOSPITAL_COMMUNITY)
Admission: EM | Admit: 2018-02-02 | Discharge: 2018-02-02 | Disposition: A | Discharge status: Home / Self Care | Payer: Medicaid Other | Attending: Emergency Medicine | Admitting: Emergency Medicine

## 2018-02-02 ENCOUNTER — Encounter (HOSPITAL_COMMUNITY): Payer: Self-pay | Admitting: Emergency Medicine

## 2018-02-02 ENCOUNTER — Emergency Department (HOSPITAL_COMMUNITY): Payer: Medicaid Other

## 2018-02-02 DIAGNOSIS — Z79899 Other long term (current) drug therapy: Secondary | ICD-10-CM | POA: Diagnosis not present

## 2018-02-02 DIAGNOSIS — J45909 Unspecified asthma, uncomplicated: Secondary | ICD-10-CM | POA: Insufficient documentation

## 2018-02-02 DIAGNOSIS — G8929 Other chronic pain: Secondary | ICD-10-CM | POA: Diagnosis not present

## 2018-02-02 DIAGNOSIS — R110 Nausea: Secondary | ICD-10-CM

## 2018-02-02 DIAGNOSIS — R1012 Left upper quadrant pain: Secondary | ICD-10-CM | POA: Diagnosis present

## 2018-02-02 DIAGNOSIS — K2970 Gastritis, unspecified, without bleeding: Secondary | ICD-10-CM

## 2018-02-02 DIAGNOSIS — R1010 Upper abdominal pain, unspecified: Secondary | ICD-10-CM

## 2018-02-02 LAB — CBC
HEMATOCRIT: 36.6 % (ref 36.0–46.0)
HEMOGLOBIN: 12.1 g/dL (ref 12.0–15.0)
MCH: 29.8 pg (ref 26.0–34.0)
MCHC: 33.1 g/dL (ref 30.0–36.0)
MCV: 90.1 fL (ref 78.0–100.0)
Platelets: 279 10*3/uL (ref 150–400)
RBC: 4.06 MIL/uL (ref 3.87–5.11)
RDW: 12.5 % (ref 11.5–15.5)
WBC: 9.5 10*3/uL (ref 4.0–10.5)

## 2018-02-02 LAB — LIPASE, BLOOD: Lipase: 32 U/L (ref 11–51)

## 2018-02-02 LAB — URINALYSIS, ROUTINE W REFLEX MICROSCOPIC
BILIRUBIN URINE: NEGATIVE
Glucose, UA: NEGATIVE mg/dL
Hgb urine dipstick: NEGATIVE
Ketones, ur: 5 mg/dL — AB
Leukocytes, UA: NEGATIVE
NITRITE: NEGATIVE
PH: 6 (ref 5.0–8.0)
Protein, ur: NEGATIVE mg/dL
Specific Gravity, Urine: 1.026 (ref 1.005–1.030)

## 2018-02-02 LAB — COMPREHENSIVE METABOLIC PANEL
ALBUMIN: 3.8 g/dL (ref 3.5–5.0)
ALT: 28 U/L (ref 14–54)
ANION GAP: 10 (ref 5–15)
AST: 23 U/L (ref 15–41)
Alkaline Phosphatase: 91 U/L (ref 38–126)
BILIRUBIN TOTAL: 0.3 mg/dL (ref 0.3–1.2)
BUN: 15 mg/dL (ref 6–20)
CALCIUM: 9 mg/dL (ref 8.9–10.3)
CO2: 24 mmol/L (ref 22–32)
Chloride: 107 mmol/L (ref 101–111)
Creatinine, Ser: 0.92 mg/dL (ref 0.44–1.00)
GFR calc non Af Amer: >60 mL/min (ref >60)
Glucose, Bld: 112 mg/dL — ABNORMAL HIGH (ref 65–99)
POTASSIUM: 3.7 mmol/L (ref 3.5–5.1)
SODIUM: 141 mmol/L (ref 135–145)
TOTAL PROTEIN: 7.2 g/dL (ref 6.5–8.1)

## 2018-02-02 LAB — I-STAT BETA HCG BLOOD, ED (MC, WL, AP ONLY)

## 2018-02-02 MED ORDER — ONDANSETRON 4 MG PO TBDP: 4.0000 mg | ORAL_TABLET | Freq: Three times a day (TID) | ORAL | 0 refills | Status: DC | PRN

## 2018-02-02 MED ORDER — FAMOTIDINE 20 MG PO TABS
20.0000 mg | ORAL_TABLET | Freq: Once | ORAL | Status: AC
  Administered 2018-02-02: 20 mg via ORAL
  Filled 2018-02-02: qty 1

## 2018-02-02 MED ORDER — GI COCKTAIL ~~LOC~~
30.0000 mL | Freq: Once | ORAL | Status: AC
  Administered 2018-02-02: 30 mL via ORAL
  Filled 2018-02-02: qty 30

## 2018-02-02 MED ORDER — SUCRALFATE 1 GM/10ML PO SUSP: 1.0000 g | Freq: Three times a day (TID) | ORAL | 0 refills | Status: DC

## 2018-02-02 MED ORDER — RANITIDINE HCL 150 MG PO TABS: 150.0000 mg | ORAL_TABLET | Freq: Two times a day (BID) | ORAL | 0 refills | Status: DC

## 2018-02-02 MED ORDER — ONDANSETRON 8 MG PO TBDP
8.0000 mg | ORAL_TABLET | Freq: Once | ORAL | Status: AC
  Administered 2018-02-02: 8 mg via ORAL
  Filled 2018-02-02: qty 1

## 2018-02-02 NOTE — ED Provider Notes (Signed)
[facility] DEPT Provider Note   CSN: [id number] Arrival date & time: 02/02/18  1652     History   Chief Complaint Chief Complaint  Patient presents with  . Back Pain  . Abdominal Pain    HPI Lorraine [name] is a 32 y.o. female with a PMHx of asthma, diverticulosis, GERD, chronic abd pain, chronic n/v, pre-DM2, remote pyelonephritis, and other conditions listed below, and PSHx of cholecystectomy and tubal ligation, who presents to the ED with complaints of LUQ abd pain x1 wk which was worse today.  Patient describes the pain as 6/10 constant sharp LUQ pain that radiates to the left upper back, worse with walking, and unrelieved with ibuprofen.  She reports associated nausea.  She states this is very similar to all of her prior visits, chart review reveals that pt's had multiple ER visits for abd pain and nausea, most recently on 01/03/18 when her work up was reassuring including a negative CTA/P; she had a visit on 10/13/17 to the ED where she had a pelvic U/S which was unremarkable. She's had multiple work ups for abd pain which have always been fairly reassuring.  She has not followed up with a PCP or GI dr because of financial concerns.  She also mentions some mild dysuria and a slight increase in urinary frequency.  She reports frequent NSAID use.  She denies fevers, chills, CP, SOB, vomiting, diarrhea/constipation, obstipation, melena, hematochezia, hematuria, malodorous urine, vaginal bleeding/discharge, myalgias, arthralgias, numbness, tingling, focal weakness, or any other complaints at this time. Denies recent travel, sick contacts, suspicious food intake, or EtOH use.   The history is provided by the patient and medical records. A language interpreter was used (provider).  Back Pain   Associated symptoms include abdominal pain and dysuria. Pertinent negatives include no chest pain, no fever, no numbness and no weakness.  Abdominal Pain   Associated  symptoms include nausea, dysuria and frequency. Pertinent negatives include fever, diarrhea, vomiting, constipation, hematuria, arthralgias and myalgias.    Past Medical History:  Diagnosis Date  . Asthma    Inhaler used 01/02/16  . Diverticulitis   . Nausea & vomiting 04/02/2017  . Pancreatitis   . Pre-diabetes   . Pyelonephritis   . Pyelonephritis     Patient Active Problem List   Diagnosis Date Noted  . Acute pancreatitis 04/02/2017  . Abdominal pain, acute, left upper quadrant 04/02/2017  . Intractable nausea and vomiting 04/02/2017  . Asthma in adult 04/02/2017  . Obesity (BMI 30.0-34.9) 04/02/2017  . Fatty infiltration of liver 04/02/2017  . Gastroesophageal reflux disease without esophagitis   . Pre-diabetes 07/15/2015  . Diarrhea   . Hypokalemia   . Bacterial vaginosis   . Pyelonephritis 07/07/2015  . Abdominal pain, right upper quadrant   . Asthma 10/31/2014  . History of preterm delivery 10/22/2014  . Status post repeat low transverse cesarean section 09/06/2014  . Diverticulitis 09/10/2012    Past Surgical History:  Procedure Laterality Date  . CESAREAN SECTION    . CESAREAN SECTION N/A 2006  . CESAREAN SECTION N/A 09/03/2014   Procedure: CESAREAN SECTION;  Surgeon: Guss Bunde, MD;  Location: [facility];  Service: Obstetrics;  Laterality: N/A;  . CHOLECYSTECTOMY    . ESOPHAGOGASTRODUODENOSCOPY (EGD) WITH PROPOFOL Left 04/07/2017   Procedure: ESOPHAGOGASTRODUODENOSCOPY (EGD) WITH PROPOFOL;  Surgeon: Ronnette Juniper, MD;  Location: [facility];  Service: Gastroenterology;  Laterality: Left;  . TUBAL LIGATION       OB History  Gravida  5   Para  3   Term  1   Preterm  2   AB  2   Living  3     SAB  2   TAB      Ectopic      Multiple  1   Live Births  3            Home Medications    Prior to Admission medications   Medication Sig Start Date End Date Taking? Authorizing Provider  albuterol (PROVENTIL HFA;VENTOLIN HFA) 108 (90  Base) MCG/ACT inhaler Inhale 2 puffs into the lungs every 6 (six) hours as needed for wheezing or shortness of breath.    [provider]  dicyclomine (BENTYL) 20 MG tablet Take 1 tablet (20 mg total) by mouth every 12 (twelve) hours as needed (for abdominal pain/cramping). Patient not taking: Reported on 01/03/2018 12/22/17   Antonietta Breach, PA-C  HYDROcodone-acetaminophen (NORCO) 5-325 MG tablet Take 1 tablet by mouth every 6 (six) hours as needed for severe pain. Labeled in Spanish Patient not taking: Reported on 12/21/2017 10/14/17   Orlie Dakin, MD  ibuprofen (ADVIL,MOTRIN) 600 MG tablet Take 1 tablet (600 mg total) by mouth every 6 (six) hours as needed. Patient not taking: Reported on 12/21/2017 10/10/17   Luvenia Redden, PA-C  metoCLOPramide (REGLAN) 10 MG tablet Take 1 tablet (10 mg total) by mouth every 6 (six) hours as needed for nausea (nausea/headache). Label in Spanish 01/04/18   Orlie Dakin, MD  metroNIDAZOLE (FLAGYL) 500 MG tablet Take 1 tablet (500 mg total) by mouth 2 (two) times daily. Patient not taking: Reported on 12/21/2017 10/10/17   Luvenia Redden, PA-C  metroNIDAZOLE (METROGEL VAGINAL) 0.75 % vaginal gel Place 1 Applicatorful vaginally 2 (two) times daily. Label in  spanish Patient not taking: Reported on 12/21/2017 10/14/17   Orlie Dakin, MD  ondansetron (ZOFRAN) 8 MG tablet Take 1 tablet (8 mg total) by mouth every 8 (eight) hours as needed for nausea. Patient not taking: Reported on 12/21/2017 10/14/17   Orlie Dakin, MD  promethazine (PHENERGAN) 25 MG tablet Take 1 tablet (25 mg total) by mouth every 6 (six) hours as needed for nausea or vomiting. Patient not taking: Reported on 01/03/2018 12/22/17   Antonietta Breach, PA-C    Family History Family History  Problem Relation Age of Onset  . Hyperlipidemia Mother   . Diabetes Maternal Uncle   . Diabetes Paternal Grandmother     Social History Social History   Tobacco Use  . Smoking status: Never Smoker  .  Smokeless tobacco: Never Used  Substance Use Topics  . Alcohol use: No  . Drug use: No     Allergies   Patient has no known allergies.   Review of Systems Review of Systems  Constitutional: Negative for chills and fever.  Respiratory: Negative for shortness of breath.   Cardiovascular: Negative for chest pain.  Gastrointestinal: Positive for abdominal pain and nausea. Negative for blood in stool, constipation, diarrhea and vomiting.  Genitourinary: Positive for dysuria and frequency. Negative for hematuria, vaginal bleeding and vaginal discharge.       No malodorous urine  Musculoskeletal: Positive for back pain. Negative for arthralgias and myalgias.  Skin: Negative for color change.  Allergic/Immunologic: Negative for immunocompromised state.  Neurological: Negative for weakness and numbness.  Psychiatric/Behavioral: Negative for confusion.   All other systems reviewed and are negative for acute change except as noted in the HPI.    Physical Exam  Updated Vital Signs BP 120/78 (BP Location: Left Arm)   Pulse 83   Temp 98.4 F (36.9 C) (Oral)   Resp 18   Ht 5' (1.524 m)   Wt 83.9 kg (185 lb)   LMP 01/20/2018   SpO2 99%   BMI 36.13 kg/m   Physical Exam  Constitutional: She is oriented to person, place, and time. Vital signs are normal. She appears well-developed and well-nourished.  Non-toxic appearance. No distress.  Afebrile, nontoxic, NAD  HENT:  Head: Normocephalic and atraumatic.  Mouth/Throat: Oropharynx is clear and moist and mucous membranes are normal.  Eyes: Conjunctivae and EOM are normal. Right eye exhibits no discharge. Left eye exhibits no discharge.  Neck: Normal range of motion. Neck supple.  Cardiovascular: Normal rate, regular rhythm, normal heart sounds and intact distal pulses. Exam reveals no gallop and no friction rub.  No murmur heard. Pulmonary/Chest: Effort normal and breath sounds normal. No respiratory distress. She has no decreased breath  sounds. She has no wheezes. She has no rhonchi. She has no rales.  Abdominal: Soft. Normal appearance and bowel sounds are normal. She exhibits no distension. There is tenderness in the epigastric area and left upper quadrant. There is no rigidity, no rebound, no guarding, no CVA tenderness, no tenderness at McBurney's point and negative Murphy's sign.  Soft, obese but nondistended, +BS throughout, with mild LUQ and epigastric TTP, no r/g/r, neg murphy's, neg mcburney's, no CVA TTP   Musculoskeletal: Normal range of motion.  Neurological: She is alert and oriented to person, place, and time. She has normal strength. No sensory deficit.  Skin: Skin is warm, dry and intact. No rash noted.  Psychiatric: She has a normal mood and affect.  Nursing note and vitals reviewed.    ED Treatments / Results  Labs (all labs ordered are listed, but only abnormal results are displayed) Labs Reviewed  COMPREHENSIVE METABOLIC PANEL - Abnormal; Notable for the following components:      Result Value   Glucose, Bld 112 (*)    All other components within normal limits  URINALYSIS, ROUTINE W REFLEX MICROSCOPIC - Abnormal; Notable for the following components:   APPearance HAZY (*)    Ketones, ur 5 (*)    All other components within normal limits  LIPASE, BLOOD  CBC  I-STAT BETA HCG BLOOD, ED ([facility], [facility], [facility] ONLY)    EKG None  Radiology Dg Abd Acute W/chest  Result Date: 02/02/2018 CLINICAL DATA:  Acute abdominal pain for 1 week. EXAM: DG ABDOMEN ACUTE W/ 1V CHEST COMPARISON:  01/03/2017 chest radiograph and abdominal CT FINDINGS: There is no evidence of dilated bowel loops or free intraperitoneal air. No radiopaque calculi or other significant radiographic abnormality is seen. Heart size and mediastinal contours are within normal limits. Both lungs are clear. Tubal ligation and cholecystectomy clips again noted. IMPRESSION: Negative abdominal radiographs.  No acute cardiopulmonary disease. Electronically  Signed   By: Margarette Canada M.D.   On: 02/02/2018 19:30    Procedures Procedures (including critical care time)  Medications Ordered in ED Medications  gi cocktail (Maalox,Lidocaine,Donnatal) (30 mLs Oral Given 02/02/18 1918)  ondansetron (ZOFRAN-ODT) disintegrating tablet 8 mg (8 mg Oral Given 02/02/18 1918)  famotidine (PEPCID) tablet 20 mg (20 mg Oral Given 02/02/18 1918)     Initial Impression / Assessment and Plan / ED Course  I have reviewed the triage vital signs and the nursing notes.  Pertinent labs & imaging results that were available during my care of the patient  were reviewed by me and considered in my medical decision making (see chart for details).     32 y.o. female here with chronic abd pain and nausea, has had multiple visits in the past for similar complaints, most recently on 01/03/18 when she had an unremarkable CTA/P and labs. Has not followed up with GI due to financial concerns. On exam, mild LUQ and epigastric TTP, nonperitoneal, no flank tenderness, neg murphy's and mcburney's. Well appearing and in NAD. Work up here shows: U/A without evidence of UTI or hematuria, betaHCG neg, lipase WNL, CMP WNL, CBC WNL. Symptoms overall consistent with gastritis/GERD/PUD. Will check acute abd series to ensure no kidney stone since she states some dysuria and urinary frequency, although doubtful this is the etiology of her chronic symptoms; doubt need for CT imaging or pelvic exam. Will give GI cocktail, zofran, and pepcid then reassess shortly.   9:11 PM Acute abd series unremarkable. Pt feeling somewhat better, tolerating PO well. Symptoms most consistent with gastritis/GERD/PUD. Discussed diet/lifestyle modifications for symptoms, will start on zantac/zofran/carafate, advised tylenol and avoidance/sparing use of NSAIDs only on full stomach, discussed other OTC remedies for symptomatic relief, and f/up with [place] in 5-7 days for recheck of symptoms and ongoing evaluation/management as  well as establishing medical care. Discussed with pt the importance of outpatient f/up rather than repeatedly coming to the ED for this chronic complaint, and why it's necessary to follow up appropriately. I explained the diagnosis and have given explicit precautions to return to the ER including for any other new or worsening symptoms. The patient understands and accepts the medical plan as it's been dictated and I have answered their questions. Discharge instructions concerning home care and prescriptions have been given. The patient is STABLE and is discharged to home in good condition.    Final Clinical Impressions(s) / ED Diagnoses   Final diagnoses:  Upper abdominal pain  Nausea  Gastritis, presence of bleeding unspecified, unspecified chronicity, unspecified gastritis type    ED Discharge Orders        Ordered    ranitidine (ZANTAC) 150 MG tablet  2 times daily     02/02/18 2110    ondansetron (ZOFRAN ODT) 4 MG disintegrating tablet  Every 8 hours PRN     02/02/18 2110    sucralfate (CARAFATE) 1 GM/10ML suspension  3 times daily with meals & bedtime     02/02/18 [address], [place], [place] 02/02/18 2112    Quintella Reichert, MD 02/05/18 1236

## 2018-02-02 NOTE — ED Triage Notes (Signed)
Per interpreter, patient c/o abdominal pain with nausea and back pain today. Reports hx pyelo and states sx are similar. Denies V/D.

## 2018-02-02 NOTE — Discharge Instructions (Addendum)
Your abdominal pain is likely from gastritis or an ulcer. You will need to take zantac as directed, and avoid spicy/fatty/acidic foods, avoid soda/coffee/tea/alcohol. Avoid laying down flat within 30 minutes of eating. Avoid NSAIDs like ibuprofen/aleve/motrin/etc on an empty stomach. May consider using over the counter tums/maalox as needed for additional relief. Use zofran as directed as needed for nausea. Use tylenol as needed for pain. Use carafate as directed as needed for additional symptom relief.  Follow up with the [facility] in 1 week for recheck of symptoms and ongoing management of your recurrent abdominal pain, and to establish medical care. Return to the ER for changes or worsening symptoms.  Abdominal (belly) pain can be caused by many things. Your caregiver performed an examination and possibly ordered blood/urine tests and imaging (CT scan, x-rays, ultrasound). Many cases can be observed and treated at home after initial evaluation in the emergency department. Even though you are being discharged home, abdominal pain can be unpredictable. Therefore, you need a repeated exam if your pain does not resolve, returns, or worsens. Most patients with abdominal pain don't have to be admitted to the hospital or have surgery, but serious problems like appendicitis and gallbladder attacks can start out as nonspecific pain. Many abdominal conditions cannot be diagnosed in one visit, so follow-up evaluations are very important. SEEK IMMEDIATE MEDICAL ATTENTION IF YOU DEVELOP ANY OF THE FOLLOWING SYMPTOMS: The pain does not go away or becomes severe.  A temperature above 101 develops.  Repeated vomiting occurs (multiple episodes).  The pain becomes localized to portions of the abdomen. The right side could possibly be appendicitis. In an adult, the left lower portion of the abdomen could be colitis or diverticulitis.  Blood is being passed in stools or vomit (bright red or black tarry  stools).  Return also if you develop chest pain, difficulty breathing, dizziness or fainting, or become confused, poorly responsive, or inconsolable (young children). The constipation stays for more than 4 days.  There is belly (abdominal) or rectal pain.  You do not seem to be getting better.

## 2018-02-07 ENCOUNTER — Observation Stay (HOSPITAL_COMMUNITY)
Admission: EM | Admit: 2018-02-07 | Discharge: 2018-02-08 | Disposition: A | Discharge status: Home / Self Care | Payer: Self-pay | Attending: Family Medicine | Admitting: Family Medicine

## 2018-02-07 ENCOUNTER — Encounter (HOSPITAL_COMMUNITY): Payer: Self-pay | Admitting: Emergency Medicine

## 2018-02-07 DIAGNOSIS — E875 Hyperkalemia: Secondary | ICD-10-CM | POA: Insufficient documentation

## 2018-02-07 DIAGNOSIS — E876 Hypokalemia: Secondary | ICD-10-CM | POA: Insufficient documentation

## 2018-02-07 DIAGNOSIS — Z79899 Other long term (current) drug therapy: Secondary | ICD-10-CM | POA: Insufficient documentation

## 2018-02-07 DIAGNOSIS — N760 Acute vaginitis: Secondary | ICD-10-CM | POA: Insufficient documentation

## 2018-02-07 DIAGNOSIS — R0781 Pleurodynia: Secondary | ICD-10-CM | POA: Insufficient documentation

## 2018-02-07 DIAGNOSIS — Z7951 Long term (current) use of inhaled steroids: Secondary | ICD-10-CM | POA: Insufficient documentation

## 2018-02-07 DIAGNOSIS — Z7982 Long term (current) use of aspirin: Secondary | ICD-10-CM | POA: Insufficient documentation

## 2018-02-07 DIAGNOSIS — E669 Obesity, unspecified: Secondary | ICD-10-CM | POA: Insufficient documentation

## 2018-02-07 DIAGNOSIS — J45909 Unspecified asthma, uncomplicated: Principal | ICD-10-CM | POA: Insufficient documentation

## 2018-02-07 DIAGNOSIS — R21 Rash and other nonspecific skin eruption: Secondary | ICD-10-CM

## 2018-02-07 DIAGNOSIS — R079 Chest pain, unspecified: Secondary | ICD-10-CM | POA: Diagnosis present

## 2018-02-07 DIAGNOSIS — L509 Urticaria, unspecified: Secondary | ICD-10-CM | POA: Insufficient documentation

## 2018-02-07 DIAGNOSIS — R05 Cough: Secondary | ICD-10-CM | POA: Insufficient documentation

## 2018-02-07 DIAGNOSIS — R7303 Prediabetes: Secondary | ICD-10-CM | POA: Insufficient documentation

## 2018-02-07 DIAGNOSIS — Z9049 Acquired absence of other specified parts of digestive tract: Secondary | ICD-10-CM | POA: Insufficient documentation

## 2018-02-07 DIAGNOSIS — Z6837 Body mass index (BMI) 37.0-37.9, adult: Secondary | ICD-10-CM | POA: Insufficient documentation

## 2018-02-07 DIAGNOSIS — T782XXA Anaphylactic shock, unspecified, initial encounter: Secondary | ICD-10-CM

## 2018-02-07 DIAGNOSIS — K760 Fatty (change of) liver, not elsewhere classified: Secondary | ICD-10-CM | POA: Insufficient documentation

## 2018-02-07 DIAGNOSIS — K219 Gastro-esophageal reflux disease without esophagitis: Secondary | ICD-10-CM | POA: Insufficient documentation

## 2018-02-07 DIAGNOSIS — Z791 Long term (current) use of non-steroidal anti-inflammatories (NSAID): Secondary | ICD-10-CM | POA: Insufficient documentation

## 2018-02-07 LAB — CBC
HEMATOCRIT: 39 % (ref 36.0–46.0)
HEMOGLOBIN: 13.2 g/dL (ref 12.0–15.0)
MCH: 30.1 pg (ref 26.0–34.0)
MCHC: 33.8 g/dL (ref 30.0–36.0)
MCV: 89 fL (ref 78.0–100.0)
Platelets: 331 10*3/uL (ref 150–400)
RBC: 4.38 MIL/uL (ref 3.87–5.11)
RDW: 12.7 % (ref 11.5–15.5)
WBC: 10.1 10*3/uL (ref 4.0–10.5)

## 2018-02-07 LAB — DIFFERENTIAL
Basophils Absolute: 0 10*3/uL (ref 0.0–0.1)
Basophils Relative: 0 %
EOS PCT: 0 %
Eosinophils Absolute: 0 10*3/uL (ref 0.0–0.7)
LYMPHS ABS: 1.2 10*3/uL (ref 0.7–4.0)
LYMPHS PCT: 12 %
MONO ABS: 0.1 10*3/uL (ref 0.1–1.0)
MONOS PCT: 1 %
NEUTROS ABS: 8.9 10*3/uL — AB (ref 1.7–7.7)
Neutrophils Relative %: 87 %

## 2018-02-07 LAB — I-STAT CHEM 8, ED
BUN: 16 mg/dL (ref 6–20)
CREATININE: 0.6 mg/dL (ref 0.44–1.00)
Calcium, Ion: 1.15 mmol/L (ref 1.15–1.40)
Chloride: 108 mmol/L (ref 101–111)
GLUCOSE: 135 mg/dL — AB (ref 65–99)
HCT: 38 % (ref 36.0–46.0)
Hemoglobin: 12.9 g/dL (ref 12.0–15.0)
POTASSIUM: 6.5 mmol/L — AB (ref 3.5–5.1)
Sodium: 138 mmol/L (ref 135–145)
TCO2: 23 mmol/L (ref 22–32)

## 2018-02-07 LAB — I-STAT TROPONIN, ED: Troponin i, poc: 0.02 ng/mL (ref 0.00–0.08)

## 2018-02-07 MED ORDER — ALBUTEROL SULFATE (2.5 MG/3ML) 0.083% IN NEBU: 10.0000 mg | INHALATION_SOLUTION | Freq: Once | RESPIRATORY_TRACT | Status: DC

## 2018-02-07 MED ORDER — DIPHENHYDRAMINE HCL 25 MG PO CAPS
50.0000 mg | ORAL_CAPSULE | Freq: Once | ORAL | Status: AC
  Administered 2018-02-07: 50 mg via ORAL
  Filled 2018-02-07: qty 2

## 2018-02-07 MED ORDER — DIPHENHYDRAMINE HCL 50 MG/ML IJ SOLN
50.0000 mg | Freq: Once | INTRAMUSCULAR | Status: AC
  Administered 2018-02-07: 50 mg via INTRAVENOUS
  Filled 2018-02-07: qty 1

## 2018-02-07 MED ORDER — EPINEPHRINE 0.3 MG/0.3ML IJ SOAJ
INTRAMUSCULAR | Status: AC
  Administered 2018-02-07: 0.3 mg via INTRAMUSCULAR
  Filled 2018-02-07: qty 0.3

## 2018-02-07 MED ORDER — EPINEPHRINE 0.3 MG/0.3ML IJ SOAJ
0.3000 mg | Freq: Once | INTRAMUSCULAR | Status: AC
  Administered 2018-02-07: 0.3 mg via INTRAMUSCULAR
  Filled 2018-02-07: qty 0.3

## 2018-02-07 MED ORDER — FAMOTIDINE 20 MG PO TABS
40.0000 mg | ORAL_TABLET | Freq: Once | ORAL | Status: AC
  Administered 2018-02-07: 40 mg via ORAL
  Filled 2018-02-07: qty 2

## 2018-02-07 MED ORDER — PREDNISONE 20 MG PO TABS
60.0000 mg | ORAL_TABLET | Freq: Once | ORAL | Status: AC
  Administered 2018-02-07: 60 mg via ORAL
  Filled 2018-02-07: qty 3

## 2018-02-07 NOTE — ED Provider Notes (Addendum)
[facility] DEPT Provider Note   CSN: [id number] Arrival date & time: 02/07/18  1321     History   Chief Complaint Chief Complaint  Patient presents with  . Urticaria    HPI Lorraine [name] is a 32 y.o. female.  HPI   Patient is Spanish-speaking and interpreter, while he was used throughout evaluation.  Patient is a 32 year old female with history of asthma, diverticulitis, pancreatitis, pyelonephritis who presents the emergency department today complaining of an itchy rash that began earlier this morning.  Rashes all over her entire body arms, legs and abdomen.  She also states that on her way to the emergency department she started developing throat pain.  She feels like her lips and tongue are tingly as well.  She feels like it is somewhat hard to breathe.  She denies any wheezing or chest pain.  She feels like her heart is racing.  She states she is never had symptoms like this before.  She denies any new foods, soaps, detergents, lotions, perfumes, medications.  She denies any changes in her environment at all.  She also reports a very anxious feeling that she has had for the last week.  She states that just before her symptoms started she began to feel extremely anxious and nervous.   She also complains of nausea and left lower quadrant abdominal pain which she states is been present since she was seen in the emergency department at her prior visit on 02/02/2018.  She states that her symptoms are unchanged since her prior visit.  She had a negative work-up at that time and was given Zofran, ranitidine, and Carafate as prescriptions to go home with for her symptoms.  She states that she has taken all these medications in the past and she is not allergic to them.   Past Medical History:  Diagnosis Date  . Asthma    Inhaler used 01/02/16  . Diverticulitis   . Nausea & vomiting 04/02/2017  . Pancreatitis   . Pre-diabetes   . Pyelonephritis   .  Pyelonephritis     Patient Active Problem List   Diagnosis Date Noted  . Acute pancreatitis 04/02/2017  . Abdominal pain, acute, left upper quadrant 04/02/2017  . Intractable nausea and vomiting 04/02/2017  . Asthma in adult 04/02/2017  . Obesity (BMI 30.0-34.9) 04/02/2017  . Fatty infiltration of liver 04/02/2017  . Gastroesophageal reflux disease without esophagitis   . Pre-diabetes 07/15/2015  . Diarrhea   . Hypokalemia   . Bacterial vaginosis   . Pyelonephritis 07/07/2015  . Abdominal pain, right upper quadrant   . Asthma 10/31/2014  . History of preterm delivery 10/22/2014  . Status post repeat low transverse cesarean section 09/06/2014  . Diverticulitis 09/10/2012    Past Surgical History:  Procedure Laterality Date  . CESAREAN SECTION    . CESAREAN SECTION N/A 2006  . CESAREAN SECTION N/A 09/03/2014   Procedure: CESAREAN SECTION;  Surgeon: Guss Bunde, MD;  Location: [facility];  Service: Obstetrics;  Laterality: N/A;  . CHOLECYSTECTOMY    . ESOPHAGOGASTRODUODENOSCOPY (EGD) WITH PROPOFOL Left 04/07/2017   Procedure: ESOPHAGOGASTRODUODENOSCOPY (EGD) WITH PROPOFOL;  Surgeon: Ronnette Juniper, MD;  Location: [facility];  Service: Gastroenterology;  Laterality: Left;  . TUBAL LIGATION       OB History    Gravida  5   Para  3   Term  1   Preterm  2   AB  2   Living  3  SAB  2   TAB      Ectopic      Multiple  1   Live Births  3            Home Medications    Prior to Admission medications   Medication Sig Start Date End Date Taking? Authorizing Provider  albuterol (PROVENTIL HFA;VENTOLIN HFA) 108 (90 Base) MCG/ACT inhaler Inhale 2 puffs into the lungs every 6 (six) hours as needed for wheezing or shortness of breath.   Yes [provider]  ibuprofen (ADVIL,MOTRIN) 200 MG tablet Take 400 mg by mouth every 6 (six) hours as needed for moderate pain.   Yes [provider]  dicyclomine (BENTYL) 20 MG tablet Take 1 tablet (20 mg  total) by mouth every 12 (twelve) hours as needed (for abdominal pain/cramping). Patient not taking: Reported on 01/03/2018 12/22/17   Antonietta Breach, PA-C  metoCLOPramide (REGLAN) 10 MG tablet Take 1 tablet (10 mg total) by mouth every 6 (six) hours as needed for nausea (nausea/headache). Label in Spanish Patient not taking: Reported on 02/02/2018 01/04/18   Orlie Dakin, MD  ondansetron (ZOFRAN ODT) 4 MG disintegrating tablet Take 1 tablet (4 mg total) by mouth every 8 (eight) hours as needed for nausea or vomiting. 02/02/18   Street, [place], PA-C  ondansetron (ZOFRAN) 8 MG tablet Take 1 tablet (8 mg total) by mouth every 8 (eight) hours as needed for nausea. Patient not taking: Reported on 12/21/2017 10/14/17   Orlie Dakin, MD  promethazine (PHENERGAN) 25 MG tablet Take 1 tablet (25 mg total) by mouth every 6 (six) hours as needed for nausea or vomiting. Patient not taking: Reported on 01/03/2018 12/22/17   Antonietta Breach, PA-C  ranitidine (ZANTAC) 150 MG tablet Take 1 tablet (150 mg total) by mouth 2 (two) times daily. 02/02/18   Street, Mercedes, PA-C  sucralfate (CARAFATE) 1 GM/10ML suspension Take 10 mLs (1 g total) by mouth 4 (four) times daily -  with meals and at bedtime. 02/02/18   Street, Murray, PA-C    Family History Family History  Problem Relation Age of Onset  . Hyperlipidemia Mother   . Diabetes Maternal Uncle   . Diabetes Paternal Grandmother     Social History Social History   Tobacco Use  . Smoking status: Never Smoker  . Smokeless tobacco: Never Used  Substance Use Topics  . Alcohol use: No  . Drug use: No     Allergies   Patient has no known allergies.   Review of Systems Review of Systems  Constitutional: Negative for chills and fever.  HENT: Negative for ear pain and sore throat.        Sore throat, tingling sensation to tongue and lips  Eyes: Negative for pain and visual disturbance.  Respiratory: Positive for shortness of breath. Negative for cough and  wheezing.   Cardiovascular: Positive for palpitations. Negative for chest pain.  Gastrointestinal: Positive for abdominal pain (chronic, unchanged) and nausea. Negative for vomiting.  Genitourinary: Negative for dysuria and hematuria.  Musculoskeletal: Negative for arthralgias and back pain.  Skin: Positive for rash.  Neurological: Negative for seizures and headaches.  Psychiatric/Behavioral: The patient is nervous/anxious.   All other systems reviewed and are negative.   Physical Exam Updated Vital Signs BP 117/86 (BP Location: Right Arm)   Pulse (!) 131   Temp 98.5 F (36.9 C) (Oral)   Resp (!) 28   LMP 01/20/2018   SpO2 99%   Physical Exam  Constitutional: She appears well-developed  and well-nourished.  Anxious, distressed and tearful on exam  HENT:  Head: Normocephalic and atraumatic.  Speaking in whispered voice. No evidence of angioedema on exam. Tolerating secretions and PO. No pharyngeal erythema. No tonsillar swelling or exudates.   Eyes: Pupils are equal, round, and reactive to light. Conjunctivae are normal.  Neck: Normal range of motion. Neck supple.  Cardiovascular: Normal rate, regular rhythm, normal heart sounds and intact distal pulses.  No murmur heard. Pulmonary/Chest: Breath sounds normal. No stridor. She has no wheezes.  Tachypneic. Good air exchange throughout.  Abdominal: Soft. Bowel sounds are normal.  Subjective LLQ TTP without guarding, rigidity or rebound ttp. Abdomen soft.  Musculoskeletal: She exhibits no edema.  Neurological: She is alert.  Skin: Skin is warm and dry. Capillary refill takes less than 2 seconds. Rash noted.  BUE and BLE warm and well perfused. Hive like rash to BUE and neck  Psychiatric:  anxious  Nursing note and vitals reviewed.  ED Treatments / Results  Labs (all labs ordered are listed, but only abnormal results are displayed) Labs Reviewed  DIFFERENTIAL - Abnormal; Notable for the following components:      Result Value    Neutro Abs 8.9 (*)    All other components within normal limits  I-STAT CHEM 8, ED - Abnormal; Notable for the following components:   Potassium 6.5 (*)    Glucose, Bld 135 (*)    All other components within normal limits  CBC  CBC WITH DIFFERENTIAL/PLATELET  BASIC METABOLIC PANEL  I-STAT TROPONIN, ED    EKG None  Radiology No results found.  Procedures Procedures (including critical care time) CRITICAL CARE Performed by: Rodney Booze   Total critical care time: 61 minutes  Critical care time was exclusive of separately billable procedures and treating other patients.  Critical care was necessary to treat or prevent imminent or life-threatening deterioration.  Critical care was time spent personally by me on the following activities: development of treatment plan with patient and/or surrogate as well as nursing, discussions with consultants, evaluation of patient's response to treatment, examination of patient, obtaining history from patient or surrogate, ordering and performing treatments and interventions, ordering and review of laboratory studies, ordering and review of radiographic studies, pulse oximetry and re-evaluation of patient's condition.   Medications Ordered in ED Medications  diphenhydrAMINE (BENADRYL) capsule 50 mg (50 mg Oral Given 02/07/18 1352)  famotidine (PEPCID) tablet 40 mg (40 mg Oral Given 02/07/18 1352)  predniSONE (DELTASONE) tablet 60 mg (60 mg Oral Given 02/07/18 1352)  EPINEPHrine (EPI-PEN) injection 0.3 mg (0.3 mg Intramuscular Given 02/07/18 1415)  diphenhydrAMINE (BENADRYL) injection 50 mg (50 mg Intravenous Given 02/07/18 2301)  EPINEPHrine (EPI-PEN) injection 0.3 mg (0.3 mg Intramuscular Given 02/07/18 2257)     Initial Impression / Assessment and Plan / ED Course  I have reviewed the triage vital signs and the nursing notes.  Pertinent labs & imaging results that were available during my care of the patient were reviewed by me and  considered in my medical decision making (see chart for details).  Discussed pt presentation and exam findings with Dr. Tamera Punt, who personally evaluated patient and advised to give Epi in addition to medications given. Advised to monitor patient for 6 hours.  1415 Re-eval pt as she was receiving epi. Rash still present and pt in distress on exam.   1435 Re-eval pt after. HR 90. Satting at 99% on RA. States her sore throat is feeling improved and she was able to  give me additional history. Reports palpitations prior to and after receiving EPI. No chest pain. Chronic abd pain in LLQ unchanged from prior visit. deneis wheezing. On reexam her lungs are clear without wheezing.  She does have a change in voice and is still whispering on exam. C/o paresthesias to lips and tongue. No obvious angioedema on exam. Still tolerating secretions. Ensured that call bell was within reach and advised patient to press call bell if she has any recurrence of her throat pain or difficulty breathing.  Patient voices understanding and agrees to do so.   8:45 PM Re-eval and pt satting well on RA. Sleeping in NAD on exam. Discussed plan. Pt c/o midsternal CP. VSS. Will obtain EKG and trop.  10:20 PM Pt now c/o throat pain, difficulty breathing, and recurrent itching. Appears distressed. Intermittently tachycardic in the low 100s. Satting between 99-100% on RA. Tachypneic in the low 20s.Will give IV benadryl. Discussed case with Dr. Roderic Palau. He will see that patient.   Dr. Roderic Palau in to see the patient. Recommends repeating Epi. Will also order labs and plan for admission for observation.   11:22PM CONSULT with Dr. Denton Brick who will admit the patient to the hospitalist service.   Final Clinical Impressions(s) / ED Diagnoses   Final diagnoses:  Rash  Anaphylaxis, initial encounter   32 year old female presenting with hive-like rash, throat pain and difficulty speaking.  Initially vital signs were stable and she was satting  well on room air.  She then became tachycardic and appeared distressed.  She was having difficulty speaking and felt significant pain in her throat.  There is no obvious angioedema on exam.  There was no wheezing heard on auscultation of the lungs.  Patient was given p.o. Benadryl, famotidine, and prednisone.  She was also given 1 round of epinephrine.  She was observed for 6 hours.  After 6 hours patient was reevaluated and she was then complaining of midsternal chest pain.  Her vital signs remained stable at that time and her cardiopulmonary exam was negative.  Troponin and EKG were ordered.  Troponin was negative.  An EKG did not show any ST-T wave changes.  No prolonged elongation of the QTC.  Normal sinus rhythm.  On reevaluation to discuss results of troponin and EKG patient is seen in acute distress.  She is itching her arms and back.  She does have some mild recurrence of urticarial rash on the bilateral arms.  No significant rash on the back.  She is now with spring on exam and feels pain in her throat similar to when she was seen initially.  She has no obvious angioedema however does seem to be in significant distress and is whispering on exam. Will order labs and repeat epinephrine.  I-STAT Chem-8 showed hyperkalemia to 6.5.  Suspect that this may be due to hemolysis.  Will await CBC and BMP.  Charlann Lange, PA-C will follow up on BMP and tx hyperkalemia accordingly if elevated.   Pt admitted to hospitalist service for further observation.   ED Discharge Orders    None       Rodney Booze, PA-C 02/07/18 2337    Bishop Dublin 02/07/18 2339    Milton Ferguson, MD 02/09/18 [phone]

## 2018-02-07 NOTE — ED Notes (Signed)
EDMD and EDPA at bedside

## 2018-02-07 NOTE — ED Triage Notes (Signed)
Used Wall-E interpretor. Pt having lots of itching and hives that started this morning. Denies any new foods, detergents, soaps, lotions, perfumes.

## 2018-02-08 ENCOUNTER — Emergency Department (HOSPITAL_COMMUNITY): Payer: Medicaid Other

## 2018-02-08 ENCOUNTER — Encounter (HOSPITAL_COMMUNITY): Payer: Self-pay | Admitting: Nurse Practitioner

## 2018-02-08 ENCOUNTER — Observation Stay (HOSPITAL_COMMUNITY): Payer: Medicaid Other

## 2018-02-08 ENCOUNTER — Emergency Department (HOSPITAL_COMMUNITY)
Admission: EM | Admit: 2018-02-08 | Discharge: 2018-02-09 | Disposition: A | Discharge status: Home / Self Care | Payer: Medicaid Other | Source: Home / Self Care | Attending: Emergency Medicine | Admitting: Emergency Medicine

## 2018-02-08 ENCOUNTER — Other Ambulatory Visit: Payer: Self-pay

## 2018-02-08 DIAGNOSIS — T7840XA Allergy, unspecified, initial encounter: Secondary | ICD-10-CM

## 2018-02-08 DIAGNOSIS — R079 Chest pain, unspecified: Secondary | ICD-10-CM | POA: Diagnosis present

## 2018-02-08 LAB — CBC WITH DIFFERENTIAL/PLATELET
Basophils Absolute: 0 10*3/uL (ref 0.0–0.1)
Basophils Relative: 0 %
EOS ABS: 0 10*3/uL (ref 0.0–0.7)
Eosinophils Relative: 0 %
HCT: 37.8 % (ref 36.0–46.0)
HEMOGLOBIN: 12.5 g/dL (ref 12.0–15.0)
LYMPHS PCT: 21 %
Lymphs Abs: 2.5 10*3/uL (ref 0.7–4.0)
MCH: 29.7 pg (ref 26.0–34.0)
MCHC: 33.1 g/dL (ref 30.0–36.0)
MCV: 89.8 fL (ref 78.0–100.0)
Monocytes Absolute: 0.2 10*3/uL (ref 0.1–1.0)
Monocytes Relative: 1 %
NEUTROS ABS: 9.5 10*3/uL — AB (ref 1.7–7.7)
NEUTROS PCT: 78 %
Platelets: 345 10*3/uL (ref 150–400)
RBC: 4.21 MIL/uL (ref 3.87–5.11)
RDW: 13.1 % (ref 11.5–15.5)
WBC: 12.2 10*3/uL — AB (ref 4.0–10.5)

## 2018-02-08 LAB — HEMOGLOBIN A1C
HEMOGLOBIN A1C: 5.5 % (ref 4.8–5.6)
MEAN PLASMA GLUCOSE: 111.15 mg/dL

## 2018-02-08 LAB — BASIC METABOLIC PANEL
ANION GAP: 14 (ref 5–15)
BUN: 22 mg/dL — ABNORMAL HIGH (ref 6–20)
CO2: 20 mmol/L — ABNORMAL LOW (ref 22–32)
Calcium: 9.4 mg/dL (ref 8.9–10.3)
Chloride: 107 mmol/L (ref 101–111)
Creatinine, Ser: 0.97 mg/dL (ref 0.44–1.00)
GFR calc non Af Amer: >60 mL/min (ref >60)
Glucose, Bld: 203 mg/dL — ABNORMAL HIGH (ref 65–99)
POTASSIUM: 3.4 mmol/L — AB (ref 3.5–5.1)
SODIUM: 141 mmol/L (ref 135–145)

## 2018-02-08 LAB — TROPONIN I
Troponin I: <0.03 ng/mL
Troponin I: <0.03 ng/mL
Troponin I: <0.03 ng/mL (ref <0.03)

## 2018-02-08 LAB — BASIC METABOLIC PANEL WITH GFR
Anion gap: 10 (ref 5–15)
BUN: 15 mg/dL (ref 6–20)
CO2: 21 mmol/L — ABNORMAL LOW (ref 22–32)
Calcium: 9.4 mg/dL (ref 8.9–10.3)
Chloride: 109 mmol/L (ref 101–111)
Creatinine, Ser: 0.76 mg/dL (ref 0.44–1.00)
GFR calc Af Amer: >60 mL/min
GFR calc non Af Amer: >60 mL/min
Glucose, Bld: 159 mg/dL — ABNORMAL HIGH (ref 65–99)
Potassium: 3.7 mmol/L (ref 3.5–5.1)
Sodium: 140 mmol/L (ref 135–145)

## 2018-02-08 LAB — I-STAT TROPONIN, ED: Troponin i, poc: 0 ng/mL (ref 0.00–0.08)

## 2018-02-08 LAB — I-STAT BETA HCG BLOOD, ED (MC, WL, AP ONLY)

## 2018-02-08 MED ORDER — DIPHENHYDRAMINE HCL 25 MG PO CAPS
25.0000 mg | ORAL_CAPSULE | Freq: Three times a day (TID) | ORAL | Status: DC
  Administered 2018-02-08 (×2): 25 mg via ORAL
  Filled 2018-02-08 (×2): qty 1

## 2018-02-08 MED ORDER — PANTOPRAZOLE SODIUM 40 MG PO TBEC
40.0000 mg | DELAYED_RELEASE_TABLET | Freq: Every day | ORAL | Status: DC
  Administered 2018-02-08: 40 mg via ORAL
  Filled 2018-02-08: qty 1

## 2018-02-08 MED ORDER — FAMOTIDINE 40 MG PO TABS: 40.0000 mg | ORAL_TABLET | Freq: Two times a day (BID) | ORAL | 0 refills | Status: DC

## 2018-02-08 MED ORDER — PREDNISONE 20 MG PO TABS: 60.0000 mg | ORAL_TABLET | Freq: Every day | ORAL | 0 refills | Status: DC

## 2018-02-08 MED ORDER — METHYLPREDNISOLONE SODIUM SUCC 125 MG IJ SOLR
125.0000 mg | Freq: Once | INTRAMUSCULAR | Status: AC
  Administered 2018-02-08: 125 mg via INTRAVENOUS
  Filled 2018-02-08: qty 2

## 2018-02-08 MED ORDER — IPRATROPIUM-ALBUTEROL 0.5-2.5 (3) MG/3ML IN SOLN
3.0000 mL | Freq: Once | RESPIRATORY_TRACT | Status: AC
  Administered 2018-02-08: 3 mL via RESPIRATORY_TRACT
  Filled 2018-02-08: qty 3

## 2018-02-08 MED ORDER — ENOXAPARIN SODIUM 40 MG/0.4ML ~~LOC~~ SOLN
40.0000 mg | SUBCUTANEOUS | Status: DC
  Administered 2018-02-08: 40 mg via SUBCUTANEOUS
  Filled 2018-02-08: qty 0.4

## 2018-02-08 MED ORDER — ONDANSETRON HCL 4 MG/2ML IJ SOLN: 4.0000 mg | Freq: Four times a day (QID) | INTRAMUSCULAR | Status: DC | PRN

## 2018-02-08 MED ORDER — FAMOTIDINE IN NACL 20-0.9 MG/50ML-% IV SOLN
20.0000 mg | Freq: Once | INTRAVENOUS | Status: AC
  Administered 2018-02-08: 20 mg via INTRAVENOUS
  Filled 2018-02-08: qty 50

## 2018-02-08 MED ORDER — DIPHENHYDRAMINE HCL 25 MG PO TABS: 25.0000 mg | ORAL_TABLET | Freq: Four times a day (QID) | ORAL | 0 refills | Status: DC

## 2018-02-08 MED ORDER — EPINEPHRINE 0.3 MG/0.3ML IJ SOAJ
INTRAMUSCULAR | Status: AC
  Administered 2018-02-08: 0.3 mg
  Filled 2018-02-08: qty 0.3

## 2018-02-08 MED ORDER — DIPHENHYDRAMINE HCL 50 MG/ML IJ SOLN
25.0000 mg | Freq: Once | INTRAMUSCULAR | Status: AC
  Administered 2018-02-08: 25 mg via INTRAVENOUS
  Filled 2018-02-08: qty 1

## 2018-02-08 MED ORDER — ONDANSETRON HCL 4 MG PO TABS: 4.0000 mg | ORAL_TABLET | Freq: Four times a day (QID) | ORAL | Status: DC | PRN

## 2018-02-08 MED ORDER — FAMOTIDINE 20 MG PO TABS
20.0000 mg | ORAL_TABLET | Freq: Two times a day (BID) | ORAL | Status: DC
  Administered 2018-02-08 (×2): 20 mg via ORAL
  Filled 2018-02-08 (×2): qty 1

## 2018-02-08 MED ORDER — IBUPROFEN 200 MG PO TABS
400.0000 mg | ORAL_TABLET | Freq: Four times a day (QID) | ORAL | Status: DC | PRN
  Administered 2018-02-08: 400 mg via ORAL
  Filled 2018-02-08: qty 2

## 2018-02-08 MED ORDER — LORAZEPAM 2 MG/ML IJ SOLN
0.5000 mg | Freq: Once | INTRAMUSCULAR | Status: AC
  Administered 2018-02-08: 0.5 mg via INTRAVENOUS
  Filled 2018-02-08: qty 1

## 2018-02-08 MED ORDER — ALBUTEROL SULFATE (2.5 MG/3ML) 0.083% IN NEBU
2.5000 mg | INHALATION_SOLUTION | Freq: Four times a day (QID) | RESPIRATORY_TRACT | Status: DC | PRN
  Administered 2018-02-08: 2.5 mg via RESPIRATORY_TRACT
  Filled 2018-02-08: qty 3

## 2018-02-08 MED ORDER — ALBUTEROL SULFATE HFA 108 (90 BASE) MCG/ACT IN AERS: 2.0000 | INHALATION_SPRAY | Freq: Four times a day (QID) | RESPIRATORY_TRACT | Status: DC | PRN

## 2018-02-08 MED ORDER — HYDROCORTISONE 1 % EX CREA
1.0000 "application " | TOPICAL_CREAM | Freq: Three times a day (TID) | CUTANEOUS | Status: DC | PRN
  Filled 2018-02-08: qty 28

## 2018-02-08 MED ORDER — DIPHENHYDRAMINE HCL 25 MG PO CAPS
25.0000 mg | ORAL_CAPSULE | Freq: Four times a day (QID) | ORAL | Status: DC | PRN
  Administered 2018-02-08: 25 mg via ORAL
  Filled 2018-02-08: qty 1

## 2018-02-08 MED ORDER — SODIUM CHLORIDE 0.9 % IV BOLUS
1000.0000 mL | Freq: Once | INTRAVENOUS | Status: AC
  Administered 2018-02-08: 1000 mL via INTRAVENOUS

## 2018-02-08 MED ORDER — PREDNISONE 20 MG PO TABS
60.0000 mg | ORAL_TABLET | Freq: Every day | ORAL | Status: DC
  Administered 2018-02-08: 60 mg via ORAL
  Filled 2018-02-08: qty 3

## 2018-02-08 NOTE — ED Notes (Signed)
ED TO INPATIENT HANDOFF REPORT  Name/Age/Gender Lorraine [name] 32 y.o. female  Code Status Code Status History    Date Active Date Inactive Code Status Order ID Comments User Context   04/02/2017 0808 04/09/2017 1555 Full Code [id number]  Samella Parr, NP Inpatient   07/07/2015 2351 07/11/2015 1840 Full Code [id number]  Riccardo Dubin, MD ED   10/31/2014 0735 11/01/2014 2114 Full Code [id number]  Theressa Millard, MD Inpatient   09/03/2014 1727 09/06/2014 1555 Full Code [id number]  Guss Bunde, MD Inpatient   09/03/2014 0505 09/03/2014 1727 Full Code [id number]  Christin Fudge, [place] Inpatient   08/25/2014 0137 08/31/2014 1850 Full Code [id number]  Lavon Paganini, MD Inpatient   08/06/2014 1004 08/08/2014 1406 Full Code [id number]  Myrtis Ser, CNM Inpatient   08/02/2014 2022 08/05/2014 1728 Full Code [id number]  Josephine Cables, MD Inpatient   08/02/2014 1824 08/02/2014 2022 Full Code [id number]  Josephine Cables, MD Inpatient   07/12/2014 1617 07/15/2014 2130 Full Code [id number]  Shelbie Hutching, MD Inpatient   09/10/2012 0043 09/12/2012 2005 Full Code [id number]  Nancy Nordmann, RN ED      Home/SNF/Other Home  Chief Complaint body itches  Level of Care/Admitting Diagnosis ED Disposition    ED Disposition Condition Comment   [facility] Area: [facility] [[id number]]  Level of Care: Telemetry [5]  Admit to tele based on following criteria: Other see comments  Comments: Chest pain  Diagnosis: Chest pain [646803]  Admitting Physician: Bethena Roys [[id number]]  Attending Physician: Bethena Roys [phone]  PT Class (Do Not Modify): Observation [104]  PT Acc Code (Do Not Modify): Observation [10022]       Medical History Past Medical History:  Diagnosis Date  . Asthma    Inhaler used 01/02/16  . Diverticulitis   . Nausea & vomiting 04/02/2017  . Pancreatitis   . Pre-diabetes   . Pyelonephritis   .  Pyelonephritis     Allergies No Known Allergies  IV Location/Drains/Wounds Patient Lines/Drains/Airways Status   Active Line/Drains/Airways    Name:   Placement date:   Placement time:   Site:   Days:   Peripheral IV 01/03/18 Right Antecubital   01/03/18    1736    Antecubital   36   Peripheral IV 01/04/18 Right Hand   01/04/18    0001    Hand   35   Peripheral IV 02/07/18 Left Hand   02/07/18    2234    Hand   1          Labs/Imaging Results for orders placed or performed during the hospital encounter of 02/07/18 (from the past 48 hour(s))  I-Stat Troponin, ED (not at [facility])     Status: None   Collection Time: 02/07/18  8:45 PM  Result Value Ref Range   Troponin i, poc 0.02 0.00 - 0.08 ng/mL   Comment 3            Comment: Due to the release kinetics of cTnI, a negative result within the first hours of the onset of symptoms does not rule out myocardial infarction with certainty. If myocardial infarction is still suspected, repeat the test at appropriate intervals.   I-Stat Chem 8, ED     Status: Abnormal   Collection Time: 02/07/18 10:42 PM  Result Value Ref Range   Sodium 138 135 - 145 mmol/L   Potassium 6.5 (HH) 3.5 - 5.1 mmol/L   Chloride 108 101 - 111  mmol/L   BUN 16 6 - 20 mg/dL   Creatinine, Ser 0.60 0.44 - 1.00 mg/dL   Glucose, Bld 135 (H) 65 - 99 mg/dL   Calcium, Ion 1.15 1.15 - 1.40 mmol/L   TCO2 23 22 - 32 mmol/L   Hemoglobin 12.9 12.0 - 15.0 g/dL   HCT 38.0 36.0 - 46.0 %   Comment NOTIFIED PHYSICIAN   CBC     Status: None   Collection Time: 02/07/18 11:00 PM  Result Value Ref Range   WBC 10.1 4.0 - 10.5 K/uL   RBC 4.38 3.87 - 5.11 MIL/uL   Hemoglobin 13.2 12.0 - 15.0 g/dL   HCT 39.0 36.0 - 46.0 %   MCV 89.0 78.0 - 100.0 fL   MCH 30.1 26.0 - 34.0 pg   MCHC 33.8 30.0 - 36.0 g/dL   RDW 12.7 11.5 - 15.5 %   Platelets 331 150 - 400 K/uL    Comment: Performed at [facility], [place] [address], [place], [place] [address]  Differential      Status: Abnormal   Collection Time: 02/07/18 11:00 PM  Result Value Ref Range   Neutrophils Relative % 87 %   Neutro Abs 8.9 (H) 1.7 - 7.7 K/uL   Lymphocytes Relative 12 %   Lymphs Abs 1.2 0.7 - 4.0 K/uL   Monocytes Relative 1 %   Monocytes Absolute 0.1 0.1 - 1.0 K/uL   Eosinophils Relative 0 %   Eosinophils Absolute 0.0 0.0 - 0.7 K/uL   Basophils Relative 0 %   Basophils Absolute 0.0 0.0 - 0.1 K/uL    Comment: Performed at [facility], [place] [address], [place], [place] [address]  Basic metabolic panel     Status: Abnormal   Collection Time: 02/08/18 12:35 AM  Result Value Ref Range   Sodium 140 135 - 145 mmol/L   Potassium 3.7 3.5 - 5.1 mmol/L    Comment: DELTA CHECK NOTED   Chloride 109 101 - 111 mmol/L   CO2 21 (L) 22 - 32 mmol/L   Glucose, Bld 159 (H) 65 - 99 mg/dL   BUN 15 6 - 20 mg/dL   Creatinine, Ser 0.76 0.44 - 1.00 mg/dL   Calcium 9.4 8.9 - 10.3 mg/dL   GFR calc non Af Amer >60 >60 mL/min   GFR calc Af Amer >60 >60 mL/min    Comment: (NOTE) The eGFR has been calculated using the CKD EPI equation. This calculation has not been validated in all clinical situations. eGFR's persistently <60 mL/min signify possible Chronic Kidney Disease.    Anion gap 10 5 - 15    Comment: Performed at [facility], [place] [address], [place], [place] [address]  Troponin I     Status: None   Collection Time: 02/08/18 12:35 AM  Result Value Ref Range   Troponin I <0.03 <0.03 ng/mL    Comment: Performed at [facility], [place] [address] [place], [place] [address]   Dg Chest 2 View  Result Date: 02/08/2018 CLINICAL DATA:  Central chest pain, short of breath EXAM: CHEST - 2 VIEW COMPARISON:  02/02/2018, 01/03/2018 FINDINGS: The heart size and mediastinal contours are within normal limits. Both lungs are clear. The visualized skeletal structures are unremarkable. IMPRESSION: No active cardiopulmonary disease. Electronically Signed   By:  Donavan Foil M.D.   On: 02/08/2018 02:07    Pending Labs [employer] (From admission, onward)   Start     Ordered  02/08/18 0206  Hemoglobin A1c  Add-on,   R     02/08/18 0205   02/08/18 0206  Urine rapid drug screen (hosp performed)  STAT,   R     02/08/18 0205   02/08/18 0035  Troponin I  Now then every 6 hours,   R     02/08/18 0034   02/07/18 2235  CBC with Differential  STAT,   STAT     02/07/18 2234      Vitals/Pain Today's Vitals   02/07/18 2107 02/07/18 2304 02/07/18 2315 02/08/18 0224  BP: 131/80 117/86 (!) 174/91 102/62  Pulse: (!) 101 (!) 131 (!) 132 97  Resp: (!) 22 (!) 28 (!) 56 19  Temp:  98.5 F (36.9 C)    TempSrc:  Oral    SpO2: 98% 99% 100% 98%  PainSc:  6       Isolation Precautions No active isolations  Medications Medications  diphenhydrAMINE (BENADRYL) capsule 50 mg (50 mg Oral Given 02/07/18 1352)  famotidine (PEPCID) tablet 40 mg (40 mg Oral Given 02/07/18 1352)  predniSONE (DELTASONE) tablet 60 mg (60 mg Oral Given 02/07/18 1352)  EPINEPHrine (EPI-PEN) injection 0.3 mg (0.3 mg Intramuscular Given 02/07/18 1415)  diphenhydrAMINE (BENADRYL) injection 50 mg (50 mg Intravenous Given 02/07/18 2301)  EPINEPHrine (EPI-PEN) injection 0.3 mg (0.3 mg Intramuscular Given 02/07/18 2257)    Mobility walks

## 2018-02-08 NOTE — ED Notes (Signed)
Patient is resting comfortably. 

## 2018-02-08 NOTE — ED Provider Notes (Addendum)
[facility] DEPT Provider Note   CSN: [id number] Arrival date & time: 02/08/18  1833     History   Chief Complaint Chief Complaint  Patient presents with  . Allergic Reaction   [employer] Spanish interpreter used to obtain history HPI Lorraine [name] is a 32 y.o. female with history of asthma, diverticulitis, pancreatitis, pyelonephritis who presents emergency department today for pruritic rash, sensation of throat closing, lip and tongue tingling, shortness of breath.  Patient was seen in triage and given dose of epinephrine which she states relieved her symptoms.  She was seen here yesterday for the same and given epinephrine for suspected allergic reaction admitted to the hospital.  She was discharged this morning by the hospitalist service. States upon review returning home today she felt onset of sensation of throat closing, shortness of breath, chest tightness, abdominal cramping diffuse pruritic rash to her bilateral arms, upper chest and abdomen.  She was seen in triage and given epinephrine as her now relieved her symptoms.  No other interventions prior to arrival.  She denies any angioedema but does note that her lips and her tongue are tingling.  Patient states is of the same symptoms she had when she was here yesterday.  She states that her tingling and tongue have improved, is without difficulty swallowing, chest tightness, shortness of breath or abdominal cramping.  She denies any facial swelling, inability to control secretions, chest pain, cough, hemoptysis, abdominal pain, nausea/vomiting/diarrhea. Denies contacts with persons with similar rash, or any changes in lotions/soaps/detergents, exposure to animal or plant irritants.  No new medications. No recent travel. No recent tick bites. No involvement to palms/soles or between webspaces. No other symptoms at this time.   HPI  Past Medical History:  Diagnosis Date  . Asthma    Inhaler used 01/02/16   . Diverticulitis   . Nausea & vomiting 04/02/2017  . Pancreatitis   . Pre-diabetes   . Pyelonephritis   . Pyelonephritis     Patient Active Problem List   Diagnosis Date Noted  . Chest pain 02/08/2018  . Acute pancreatitis 04/02/2017  . Abdominal pain, acute, left upper quadrant 04/02/2017  . Intractable nausea and vomiting 04/02/2017  . Asthma in adult 04/02/2017  . Obesity (BMI 30.0-34.9) 04/02/2017  . Fatty infiltration of liver 04/02/2017  . Gastroesophageal reflux disease without esophagitis   . Pre-diabetes 07/15/2015  . Diarrhea   . Hypokalemia   . Bacterial vaginosis   . Pyelonephritis 07/07/2015  . Abdominal pain, right upper quadrant   . Asthma 10/31/2014  . History of preterm delivery 10/22/2014  . Status post repeat low transverse cesarean section 09/06/2014  . Diverticulitis 09/10/2012    Past Surgical History:  Procedure Laterality Date  . CESAREAN SECTION    . CESAREAN SECTION N/A 2006  . CESAREAN SECTION N/A 09/03/2014   Procedure: CESAREAN SECTION;  Surgeon: Guss Bunde, MD;  Location: [facility];  Service: Obstetrics;  Laterality: N/A;  . CHOLECYSTECTOMY    . ESOPHAGOGASTRODUODENOSCOPY (EGD) WITH PROPOFOL Left 04/07/2017   Procedure: ESOPHAGOGASTRODUODENOSCOPY (EGD) WITH PROPOFOL;  Surgeon: Ronnette Juniper, MD;  Location: [facility];  Service: Gastroenterology;  Laterality: Left;  . TUBAL LIGATION       OB History    Gravida  5   Para  3   Term  1   Preterm  2   AB  2   Living  3     SAB  2   TAB  Ectopic      Multiple  1   Live Births  3            Home Medications    Prior to Admission medications   Medication Sig Start Date End Date Taking? Authorizing Provider  albuterol (PROVENTIL HFA;VENTOLIN HFA) 108 (90 Base) MCG/ACT inhaler Inhale 2 puffs into the lungs every 6 (six) hours as needed for wheezing or shortness of breath.    [provider]  dicyclomine (BENTYL) 20 MG tablet Take 1 tablet (20 mg total) by  mouth every 12 (twelve) hours as needed (for abdominal pain/cramping). Patient not taking: Reported on 01/03/2018 12/22/17   Antonietta Breach, PA-C  ibuprofen (ADVIL,MOTRIN) 200 MG tablet Take 400 mg by mouth every 6 (six) hours as needed for moderate pain.    [provider]  metoCLOPramide (REGLAN) 10 MG tablet Take 1 tablet (10 mg total) by mouth every 6 (six) hours as needed for nausea (nausea/headache). Label in Spanish Patient not taking: Reported on 02/02/2018 01/04/18   Orlie Dakin, MD  ondansetron (ZOFRAN ODT) 4 MG disintegrating tablet Take 1 tablet (4 mg total) by mouth every 8 (eight) hours as needed for nausea or vomiting. 02/02/18   Street, [place], PA-C  ondansetron (ZOFRAN) 8 MG tablet Take 1 tablet (8 mg total) by mouth every 8 (eight) hours as needed for nausea. Patient not taking: Reported on 12/21/2017 10/14/17   Orlie Dakin, MD  predniSONE (DELTASONE) 20 MG tablet Take 3 tablets (60 mg total) by mouth daily with breakfast. 02/09/18   Nita Sells, MD  promethazine (PHENERGAN) 25 MG tablet Take 1 tablet (25 mg total) by mouth every 6 (six) hours as needed for nausea or vomiting. Patient not taking: Reported on 01/03/2018 12/22/17   Antonietta Breach, PA-C  ranitidine (ZANTAC) 150 MG tablet Take 1 tablet (150 mg total) by mouth 2 (two) times daily. 02/02/18   Street, [place], PA-C  sucralfate (CARAFATE) 1 GM/10ML suspension Take 10 mLs (1 g total) by mouth 4 (four) times daily -  with meals and at bedtime. 02/02/18   Street, [place], PA-C    Family History Family History  Problem Relation Age of Onset  . Hyperlipidemia Mother   . Diabetes Maternal Uncle   . Diabetes Paternal Grandmother     Social History Social History   Tobacco Use  . Smoking status: Never Smoker  . Smokeless tobacco: Never Used  Substance Use Topics  . Alcohol use: No  . Drug use: No     Allergies   Patient has no known allergies.   Review of Systems Review of Systems  All other  systems reviewed and are negative.    Physical Exam Updated Vital Signs BP (!) 123/99 (BP Location: Left Arm)   Pulse (!) 105   Temp 98.3 F (36.8 C) (Oral)   Resp 18   LMP 01/20/2018   SpO2 94%   Physical Exam  Constitutional: She appears well-developed and well-nourished.  HENT:  Head: Normocephalic and atraumatic.  Right Ear: External ear normal.  Left Ear: External ear normal.  Nose: Nose normal.  Mouth/Throat: Uvula is midline, oropharynx is clear and moist and mucous membranes are normal. No tonsillar exudate.  The patient is whispering but with able phonation and is in control of secretions. No stridor.  Midline uvula without edema. Tongue protrusion is normal. No tongue edema. No lip swelling or edema. No angioedema. Soft palate rises symmetrically.  No tonsillar erythema or exudates. No PTA.  No trismus. No  creptius on neck palpation and patient has good dentition. No gingival erythema or fluctuance noted. Mucus membranes moist.   Eyes: Pupils are equal, round, and reactive to light. Right eye exhibits no discharge. Left eye exhibits no discharge. No scleral icterus.  Neck: Trachea normal and normal range of motion. Neck supple. No spinous process tenderness present. No neck rigidity. Normal range of motion present.  No stridor  Cardiovascular: Normal rate, regular rhythm and intact distal pulses.  No murmur heard. Pulses:      Radial pulses are 2+ on the right side, and 2+ on the left side.       Dorsalis pedis pulses are 2+ on the right side, and 2+ on the left side.       Posterior tibial pulses are 2+ on the right side, and 2+ on the left side.  No lower extremity swelling or edema. Calves symmetric in size bilaterally.  Pulmonary/Chest: Effort normal and breath sounds normal. She exhibits no tenderness.  No increased work of breathing. No accessory muscle use. Patient is sitting upright, speaking in full sentences (whispering) without difficulty   Abdominal: Soft.  Bowel sounds are normal. She exhibits no distension. There is no tenderness. There is no rigidity, no rebound and no guarding.  Musculoskeletal: She exhibits no edema.  Lymphadenopathy:    She has no cervical adenopathy.  Neurological: She is alert.  Skin: Skin is warm, dry and intact. Capillary refill takes less than 2 seconds. No rash noted. She is not diaphoretic.  Urticarial rash on the patient's bilateral arms, and upper chest.  Minor excoriations without evidence of superimposed infection.  Psychiatric: She has a normal mood and affect.  Nursing note and vitals reviewed.    ED Treatments / Results  Labs (all labs ordered are listed, but only abnormal results are displayed) Labs Reviewed  BASIC METABOLIC PANEL - Abnormal; Notable for the following components:      Result Value   Potassium 3.4 (*)    CO2 20 (*)    Glucose, Bld 203 (*)    BUN 22 (*)    All other components within normal limits  CBC WITH DIFFERENTIAL/PLATELET - Abnormal; Notable for the following components:   WBC 12.2 (*)    Neutro Abs 9.5 (*)    All other components within normal limits  I-STAT BETA HCG BLOOD, ED ([facility], [facility], [facility] ONLY)  I-STAT TROPONIN, ED    EKG EKG Interpretation  Date/Time:  Saturday Feb 08 2018 20:22:11 EDT Ventricular Rate:  98 PR Interval:    QRS Duration: 102 QT Interval:  367 QTC Calculation: 469 R Axis:   36 Text Interpretation:  Sinus rhythm Low voltage, precordial leads Borderline T abnormalities, anterior leads When compared with ECG of EARLIER SAME DATE No significant change was found Confirmed by Delora Fuel ([id number]) on 02/09/2018 12:27:59 AM   Radiology Dg Chest 2 View  Result Date: 02/08/2018 CLINICAL DATA:  Central chest pain, short of breath EXAM: CHEST - 2 VIEW COMPARISON:  02/02/2018, 01/03/2018 FINDINGS: The heart size and mediastinal contours are within normal limits. Both lungs are clear. The visualized skeletal structures are unremarkable. IMPRESSION: No active  cardiopulmonary disease. Electronically Signed   By: Donavan Foil M.D.   On: 02/08/2018 02:07    Procedures Procedures (including critical care time) CRITICAL CARE Performed by: Jillyn Ledger   Total critical care time: 35 minutes  Critical care time was exclusive of separately billable procedures and treating other patients.  Critical care was necessary  to treat or prevent imminent or life-threatening deterioration.  Critical care was time spent personally by me on the following activities: development of treatment plan with patient and/or surrogate as well as nursing, discussions with consultants, evaluation of patient's response to treatment, examination of patient, obtaining history from patient or surrogate, ordering and performing treatments and interventions, ordering and review of laboratory studies, ordering and review of radiographic studies, pulse oximetry and re-evaluation of patient's condition.   Medications Ordered in ED Medications  sodium chloride 0.9 % bolus 1,000 mL (has no administration in time range)  methylPREDNISolone sodium succinate (SOLU-MEDROL) 125 mg/2 mL injection 125 mg (has no administration in time range)  diphenhydrAMINE (BENADRYL) injection 25 mg (has no administration in time range)  famotidine (PEPCID) IVPB 20 mg premix (has no administration in time range)  ipratropium-albuterol (DUONEB) 0.5-2.5 (3) MG/3ML nebulizer solution 3 mL (has no administration in time range)  LORazepam (ATIVAN) injection 0.5 mg (has no administration in time range)  EPINEPHrine (EPI-PEN) 0.3 mg/0.3 mL injection (0.3 mg  Given 02/08/18 1913)     Initial Impression / Assessment and Plan / ED Course  I have reviewed the triage vital signs and the nursing notes.  Pertinent labs & imaging results that were available during my care of the patient were reviewed by me and considered in my medical decision making (see chart for details).     32 y.o. female with history of  asthma, diverticulitis, pancreatitis, pyelonephritis who presents emergency department today for pruritic rash, sensation of throat closing, lip and tongue tingling, shortness of breath.  Patient was seen in triage and given dose of epinephrine which she states relieved her symptoms.  She was seen here yesterday for the same and given epinephrine for suspected allergic reaction admitted to the hospital.  She was discharged this morning by the hospitalist service..  States upon review returning home today she felt onset of sensation of throat closing, shortness of breath, chest tightness, abdominal cramping diffuse pruritic rash to her bilateral arms, upper chest and abdomen.  She was seen in triage and given epinephrine as her now relieved her symptoms.  No other interventions prior to arrival.  She denies any angioedema but does note that her lips and her tongue are tingling.  Patient states is of the same symptoms she had when she was here yesterday.  She states that her tingling and tongue have improved and is without difficulty swallowing, chest tightness, shortness of breath or abdominal cramping.  She denies any facial swelling, inability to control secretions, chest pain, cough, hemoptysis, abdominal pain, nausea/vomiting/diarrhea. Denies contacts with persons with similar rash, or any changes in lotions/soaps/detergents, exposure to animal or plant irritants.  No new medications. No recent travel. No recent tick bites. No involvement to palms/soles or between webspaces. No other symptoms at this time.   After intial assessment patient was given IVF, Pepcid, IV solumedrol and benadryl for her symptoms. Lab work, EKG, and chest xray reviewed and reassuring. Patient with reassuring workup. Patient observed for 4 hours with improvement. Patient re-evaluated prior to dc, is hemodynamically stable, in no respiratory distress. Pt has been advised to take prednisone, pepcid and benadryl and return for s/s  including throat closing, difficulty breathing, swelling of lips face or tongue. Patient did not take home medications after discharge home. Will give trial of home therapy with prescription for benadryl, prednisone and Pepcid. Advised it is important to take these medications as directed. Prior to discharge patient was evaluated by my attending,  Dr. Roderic Palau. Pt is to follow up with their PCP. Discussed that she may need to follow up with an allergist. Return precautions discussed. Pt is agreeable with plan & verbalizes understanding.   Vitals:   02/08/18 2115 02/08/18 2130 02/08/18 2145 02/08/18 2251  BP:  138/88 138/88 139/85  Pulse: 85 91 98 85  Resp: 14 (!) 26 20 (!) 27  Temp:      TempSrc:      SpO2: 98% 97% 100% 98%   Patient case seen and discussed with Dr. Roderic Palau who is in agreement with plan.   Final Clinical Impressions(s) / ED Diagnoses   Final diagnoses:  Allergic reaction, initial encounter    ED Discharge Orders        Ordered    predniSONE (DELTASONE) 20 MG tablet  Daily with breakfast     02/08/18 2324    diphenhydrAMINE (BENADRYL) 25 MG tablet  Every 6 hours     02/08/18 2324    famotidine (PEPCID) 40 MG tablet  2 times daily     02/08/18 2324       Lorelle Gibbs 02/09/18 2148    Jillyn Ledger, PA-C 02/09/18 2149    Jillyn Ledger, PA-C 02/09/18 2150    Milton Ferguson, MD 02/09/18 [phone]

## 2018-02-08 NOTE — Progress Notes (Signed)
Patient discharged home. Verbalized understanding off all instructions. Denied questions or concerns about instructions. Interpreter was called for translation. Transferred to lobby by w/c.

## 2018-02-08 NOTE — Care Management Note (Signed)
Case Management Note  Patient Details  Name: Lorraine [name] MRN: [id number] Date of Birth: 07/05/1986  Subjective/Objective:  Allergic reaction                  Action/Plan: NCM spoke to pt at bedside with assistance of family member, Antonio via phone. He was able to explain to pt to call [facility] for follow up appt on Tuesday and to check her Medicaid card to see if PCP list.  Expected Discharge Date:  02/08/18               Expected Discharge Plan:  Home/Self Care  In-House Referral:  NA  Discharge planning Services  CM Consult, [facility]  Post Acute Care Choice:  NA Choice offered to:  NA  DME Arranged:  N/A DME Agency:  NA  HH Arranged:    [place] Agency:  NA  Status of Service:  Completed, signed off  If discussed at [place] of Stay Meetings, dates discussed:    Additional Comments:  Erenest Rasher, RN 02/08/2018, 3:35 PM

## 2018-02-08 NOTE — H&P (Signed)
History and Physical    Chalet Kerwin ZWC:[id number] DOB: [birth year] DOA: 02/07/2018  PCP: System, Pcp Not In   Patient coming from: Home   Chief Complaint: Rash, itching, Throat tightness, chest pain  HPI: Lorraine [name] is a 32 y.o. female with medical history significant for asthma, prediabetes, acute pancreatitis, fatty liver, who presented to the ED with complaints of itching and hives that started this morning 5/24.  Patient is Spanish-speaking, English is limited but able to give me a history, spouse is at bedside.Patient denies new soaps, creams, change in diet or unusual food.  Hives were present on her upper extremities.  Patient reported on the way to the ED she developed throat pain.  Patient reports to EDP that she has been feeling very anxious over the past week. Multiple ED visits- 11 since December 2018, mostly for abdominal pain.  Patient reports since last ED visit 02/02/18-abdominal pain has persisted with nausea.  Patient has had multiple imaging studies-including CT abdomen and pelvis with contrast x2 , transabdominal and transvaginal pelvic ultrasounds X3 and blood work that are essentially unremarkable.  Patient was prescribed ranitidine sucralfate ondansetron, which she took which she reports he has taken his medications several times in the past without problems. Patient reports abdominal pain is left upper quadrant, non- radiating, no diarrhea or dark stools, no vomiting.  Patient denies family history of heart disease.  ED Course: Heart rate intermittent tachycardia to 132, with increased respiratory rate, stable blood pressure, sats greater than 96% on room air.  Patient was given prednisone- 60mg , famotidine, epinephrine injection x2 and Benadryl X 2.  I-STAT Chem-8 -showed elevated potassium 6.5 , repeat pending , CBC unremarkable later in the day as patient was about to be discharged, for several hours in the ED, she reported she was having chest tightness with  shortness of breath,.  I-STAT troponin was negative reported EKG was unremarkable.  Hospitalist was called to admit for allergic reaction and chest pain.  Review of Systems: As per HPI otherwise 10 point review of systems negative.   Past Medical History:  Diagnosis Date  . Asthma    Inhaler used 01/02/16  . Diverticulitis   . Nausea & vomiting 04/02/2017  . Pancreatitis   . Pre-diabetes   . Pyelonephritis   . Pyelonephritis     Past Surgical History:  Procedure Laterality Date  . CESAREAN SECTION    . CESAREAN SECTION N/A 2006  . CESAREAN SECTION N/A 09/03/2014   Procedure: CESAREAN SECTION;  Surgeon: Guss Bunde, MD;  Location: [facility];  Service: Obstetrics;  Laterality: N/A;  . CHOLECYSTECTOMY    . ESOPHAGOGASTRODUODENOSCOPY (EGD) WITH PROPOFOL Left 04/07/2017   Procedure: ESOPHAGOGASTRODUODENOSCOPY (EGD) WITH PROPOFOL;  Surgeon: Ronnette Juniper, MD;  Location: [facility];  Service: Gastroenterology;  Laterality: Left;  . TUBAL LIGATION      reports that she has never smoked. She has never used smokeless tobacco. She reports that she does not drink alcohol or use drugs.  No Known Allergies  Family History  Problem Relation Age of Onset  . Hyperlipidemia Mother   . Diabetes Maternal Uncle   . Diabetes Paternal Grandmother     Prior to Admission medications   Medication Sig Start Date End Date Taking? Authorizing Provider  albuterol (PROVENTIL HFA;VENTOLIN HFA) 108 (90 Base) MCG/ACT inhaler Inhale 2 puffs into the lungs every 6 (six) hours as needed for wheezing or shortness of breath.   Yes [provider]  ibuprofen (ADVIL,MOTRIN) 200 MG tablet Take  400 mg by mouth every 6 (six) hours as needed for moderate pain.   Yes [provider]  dicyclomine (BENTYL) 20 MG tablet Take 1 tablet (20 mg total) by mouth every 12 (twelve) hours as needed (for abdominal pain/cramping). Patient not taking: Reported on 01/03/2018 12/22/17   Antonietta Breach, PA-C  metoCLOPramide  (REGLAN) 10 MG tablet Take 1 tablet (10 mg total) by mouth every 6 (six) hours as needed for nausea (nausea/headache). Label in Spanish Patient not taking: Reported on 02/02/2018 01/04/18   Orlie Dakin, MD  ondansetron (ZOFRAN ODT) 4 MG disintegrating tablet Take 1 tablet (4 mg total) by mouth every 8 (eight) hours as needed for nausea or vomiting. 02/02/18   Street, [place], PA-C  ondansetron (ZOFRAN) 8 MG tablet Take 1 tablet (8 mg total) by mouth every 8 (eight) hours as needed for nausea. Patient not taking: Reported on 12/21/2017 10/14/17   Orlie Dakin, MD  promethazine (PHENERGAN) 25 MG tablet Take 1 tablet (25 mg total) by mouth every 6 (six) hours as needed for nausea or vomiting. Patient not taking: Reported on 01/03/2018 12/22/17   Antonietta Breach, PA-C  ranitidine (ZANTAC) 150 MG tablet Take 1 tablet (150 mg total) by mouth 2 (two) times daily. 02/02/18   Street, [place], PA-C  sucralfate (CARAFATE) 1 GM/10ML suspension Take 10 mLs (1 g total) by mouth 4 (four) times daily -  with meals and at bedtime. 02/02/18   Street, [place], [place]    Physical Exam: Vitals:   02/07/18 2009 02/07/18 2107 02/07/18 2304 02/07/18 2315  BP: 131/80 131/80 117/86 (!) 174/91  Pulse: 94 (!) 101 (!) 131 (!) 132  Resp: (!) 24 (!) 22 (!) 28 (!) 56  Temp: 98.3 F (36.8 C)  98.5 F (36.9 C)   TempSrc: Oral  Oral   SpO2: 100% 98% 99% 100%    Constitutional: NAD, calm, comfortable, anxious appearing Vitals:   02/07/18 2009 02/07/18 2107 02/07/18 2304 02/07/18 2315  BP: 131/80 131/80 117/86 (!) 174/91  Pulse: 94 (!) 101 (!) 131 (!) 132  Resp: (!) 24 (!) 22 (!) 28 (!) 56  Temp: 98.3 F (36.8 C)  98.5 F (36.9 C)   TempSrc: Oral  Oral   SpO2: 100% 98% 99% 100%   Eyes: PERRL, lids and conjunctivae normal ENMT: Mucous membranes are moist. Posterior pharynx clear of any exudate or lesions.Normal dentition.  Neck: normal, supple, no masses, no thyromegaly Respiratory: clear to auscultation bilaterally,  Normal respiratory effort. No accessory muscle use.  Cardiovascular: Mild tachycardia 103, but regular rate and rhythm, no murmurs / rubs / gallops. No extremity edema. 2+ pedal pulses. No carotid bruits.  Abdomen: no tenderness, no masses palpated. No hepatosplenomegaly. Bowel sounds positive.  Musculoskeletal: no clubbing / cyanosis. No joint deformity upper and lower extremities. Good ROM, no contractures. Normal muscle tone.  Skin: faint erythematous and papular rash right upper extremity Neurologic: CN 2-12 grossly intact. Strength 5/5 in all 4.  Psychiatric: Normal judgment and insight. Alert and oriented x 3. Normal mood.   Labs on Admission: I have personally reviewed following labs and imaging studies  CBC: Recent Labs  Lab 02/02/18 1740 02/07/18 2242 02/07/18 2300  WBC 9.5  --  10.1  NEUTROABS  --   --  8.9*  HGB 12.1 12.9 13.2  HCT 36.6 38.0 39.0  MCV 90.1  --  89.0  PLT 279  --  759   Basic Metabolic Panel: Recent Labs  Lab 02/02/18 1740 02/07/18 2242  NA  141 138  K 3.7 6.5*  CL 107 108  CO2 24  --   GLUCOSE 112* 135*  BUN 15 16  CREATININE 0.92 0.60  CALCIUM 9.0  --    Liver Function Tests: Recent Labs  Lab 02/02/18 1740  AST 23  ALT 28  ALKPHOS 91  BILITOT 0.3  PROT 7.2  ALBUMIN 3.8   Recent Labs  Lab 02/02/18 1740  LIPASE 32   Urine analysis:    Component Value Date/Time   COLORURINE YELLOW 02/02/2018 1755   APPEARANCEUR HAZY (A) 02/02/2018 1755   LABSPEC 1.026 02/02/2018 1755   PHURINE 6.0 02/02/2018 1755   GLUCOSEU NEGATIVE 02/02/2018 1755   HGBUR NEGATIVE 02/02/2018 [place] 02/02/2018 1755   BILIRUBINUR neg 07/15/2015 1026   KETONESUR 5 (A) 02/02/2018 1755   PROTEINUR NEGATIVE 02/02/2018 1755   UROBILINOGEN 0.2 09/27/2017 1922   NITRITE NEGATIVE 02/02/2018 1755   LEUKOCYTESUR NEGATIVE 02/02/2018 1755    Radiological Exams on Admission: No results found.  EKG: Independently reviewed.  Sinus rhythm no ST or  T wave abnormalities  Assessment/Plan Active Problems:   Chest pain  Chest Pain-substernal no family history of premature coronary artery disease.  Chest pain atypical started here in the ED while she was lying in bed.  Doubt ACS.  Obese but otherwise low risk. ?GI related- gastritis, considering left upper quadrant pain. Also likely anxiety component.  Never smoker.  -Trend troponins x3- so far negative -EKG a.m. - UDS -Doubt need for further work-up at this time if negative -Trial of Protonix  Uticarial rash- with itching, focal right upper extremity. ? .  Also reports shortness tightness.  Chest clear to auscultation, O2 sats greater than 96% on room air.  History of asthma.  Possibly atopy, considering hx of asthma. Not sure what exactly she is reacting to. -Continue prednisone 60 mg daily -Protonix -Famotidine.  Abdominal pain- LUQ.  Several ED visit over the past 6 months.  Imaging has involved to CT abdomen and pelvis with contrast, 3 ultrasounds transvaginal and transabdominal.  All unremarkable.  No vomiting no loose stools no dark stools hemoglobin stable.  Patient on NSAIDs. -Trial of Protonix -Will need to establish care with PCP, possible GI referral for endoscopy.  Elevated blood sugars- hx of preDM. LAst Hgba1c- 6, 06/2015. - Hgba1c   DVT prophylaxis: Lovenox Code Status:Full Family Communication: Spouse at bedside Disposition Plan: Per rounding team Consults called: None Admission status: Obs, tele   Bethena Roys MD [facility] Pager [phone] From 6PM-2AM.  Otherwise please contact night-coverage [email or web address] Password [email or web address]  02/08/2018, 1:33 AM

## 2018-02-08 NOTE — Discharge Summary (Addendum)
Physician Discharge Summary  Lorraine [name] IEP:[id number] DOB: 1986-03-04 DOA: 02/07/2018  PCP: System, Pcp Not In  Admit date: 02/07/2018 Discharge date: 02/08/2018  Recommendations for Outpatient Follow-up:  1. Patient will need outpatient PCP and potentially an allergist (   .  Discharge Diagnoses:  1. Cough secondary to asthma and pleuritic chest pain  Discharge Condition: Improved Disposition: Home  Diet recommendation: Regular  Filed Weights   02/08/18 0309  Weight: 86 kg (189 lb 9.5 oz)    History of present illness:    Hospital Course:  32 year old female no chronic past medical illnesses BMI >35 presents with cough and chest pain-in the emergency room was wheezing and had pleuritic chest pain and was referred for admission Initially patient did for "anaphylaxis" but actually just had a urticarial hives and was treated for the same and then became anxious and had chest pain Objective data at admission was negative for any signs or symptoms of angina troponin trend was flat less than 0.03 lab artifact noted potassium 6.5 on i-STAT Repeat EKG a.m. 5/25 no acute ST-T wave changes Patient was stabilized for discharge-I answered questions via interpreter over the phone regarding allergies and oral rash and patient will need a PCP to coordinate with an allergist regarding allergy testing  Patient was given a prescription for prednisone on discharge as she had a mild wheeze and she has inhalers at home which was confirmed with her  Today's assessment: S: Alert no chest pain still cough O: Vitals:  Vitals:   02/08/18 0559 02/08/18 1011  BP:  116/69  Pulse:  86  Resp:    Temp:  98.7 F (37.1 C)  SpO2: 98% 100%    Constitutional:  Awake alert pleasant no distress no other issue Mild wheeze bilaterally no rales no rhonchi no egophony Abdomen soft nontender no rebound no guarding No lower extremity edema Logically intact She has a stye on the right eye I did  not examine musculoskeletal or neurologic exam   Discharge Instructions  Discharge Instructions    Diet - low sodium heart healthy   Complete by:  As directed    Discharge instructions   Complete by:  As directed    Use inhaler Fill prescription for Prednisone and complete 7 days Follow up with primary MD   Increase activity slowly   Complete by:  As directed      Allergies as of 02/08/2018   No Known Allergies     Medication List    TAKE these medications   albuterol 108 (90 Base) MCG/ACT inhaler Commonly known as:  PROVENTIL HFA;VENTOLIN HFA Inhale 2 puffs into the lungs every 6 (six) hours as needed for wheezing or shortness of breath.   dicyclomine 20 MG tablet Commonly known as:  BENTYL Take 1 tablet (20 mg total) by mouth every 12 (twelve) hours as needed (for abdominal pain/cramping).   ibuprofen 200 MG tablet Commonly known as:  ADVIL,MOTRIN Take 400 mg by mouth every 6 (six) hours as needed for moderate pain.   metoCLOPramide 10 MG tablet Commonly known as:  REGLAN Take 1 tablet (10 mg total) by mouth every 6 (six) hours as needed for nausea (nausea/headache). Label in Spanish   ondansetron 4 MG disintegrating tablet Commonly known as:  ZOFRAN ODT Take 1 tablet (4 mg total) by mouth every 8 (eight) hours as needed for nausea or vomiting.   ondansetron 8 MG tablet Commonly known as:  ZOFRAN Take 1 tablet (8 mg total) by mouth every  8 (eight) hours as needed for nausea.   predniSONE 20 MG tablet Commonly known as:  DELTASONE Take 3 tablets (60 mg total) by mouth daily with breakfast. Start taking on:  02/09/2018   promethazine 25 MG tablet Commonly known as:  PHENERGAN Take 1 tablet (25 mg total) by mouth every 6 (six) hours as needed for nausea or vomiting.   ranitidine 150 MG tablet Commonly known as:  ZANTAC Take 1 tablet (150 mg total) by mouth 2 (two) times daily.   sucralfate 1 GM/10ML suspension Commonly known as:  CARAFATE Take 10 mLs (1 g  total) by mouth 4 (four) times daily -  with meals and at bedtime.      No Known Allergies  The results of significant diagnostics from this hospitalization (including imaging, microbiology, ancillary and laboratory) are listed below for reference.    Significant Diagnostic Studies: Dg Chest 2 View  Result Date: 02/08/2018 CLINICAL DATA:  Central chest pain, short of breath EXAM: CHEST - 2 VIEW COMPARISON:  02/02/2018, 01/03/2018 FINDINGS: The heart size and mediastinal contours are within normal limits. Both lungs are clear. The visualized skeletal structures are unremarkable. IMPRESSION: No active cardiopulmonary disease. Electronically Signed   By: Donavan Foil M.D.   On: 02/08/2018 02:07   Dg Abd Acute W/chest  Result Date: 02/02/2018 CLINICAL DATA:  Acute abdominal pain for 1 week. EXAM: DG ABDOMEN ACUTE W/ 1V CHEST COMPARISON:  01/03/2017 chest radiograph and abdominal CT FINDINGS: There is no evidence of dilated bowel loops or free intraperitoneal air. No radiopaque calculi or other significant radiographic abnormality is seen. Heart size and mediastinal contours are within normal limits. Both lungs are clear. Tubal ligation and cholecystectomy clips again noted. IMPRESSION: Negative abdominal radiographs.  No acute cardiopulmonary disease. Electronically Signed   By: Margarette Canada M.D.   On: 02/02/2018 19:30    Microbiology: No results found for this or any previous visit (from the past 240 hour(s)).   Labs: Basic Metabolic Panel: Recent Labs  Lab 02/02/18 1740 02/07/18 2242 02/08/18 0035  NA 141 138 140  K 3.7 6.5* 3.7  CL 107 108 109  CO2 24  --  21*  GLUCOSE 112* 135* 159*  BUN 15 16 15   CREATININE 0.92 0.60 0.76  CALCIUM 9.0  --  9.4   Liver Function Tests: Recent Labs  Lab 02/02/18 1740  AST 23  ALT 28  ALKPHOS 91  BILITOT 0.3  PROT 7.2  ALBUMIN 3.8   Recent Labs  Lab 02/02/18 1740  LIPASE 32   No results for input(s): AMMONIA in the last 168  hours. CBC: Recent Labs  Lab 02/02/18 1740 02/07/18 2242 02/07/18 2300  WBC 9.5  --  10.1  NEUTROABS  --   --  8.9*  HGB 12.1 12.9 13.2  HCT 36.6 38.0 39.0  MCV 90.1  --  89.0  PLT 279  --  331   Cardiac Enzymes: Recent Labs  Lab 02/08/18 0035 02/08/18 0606  TROPONINI <0.03 <0.03   BNP: BNP (last 3 results) No results for input(s): BNP in the last 8760 hours.  ProBNP (last 3 results) No results for input(s): PROBNP in the last 8760 hours.  CBG: No results for input(s): GLUCAP in the last 168 hours.  Active Problems:   Chest pain   Time coordinating discharge: 20  Signed:  Verneita Griffes, MD [facility] [phone]

## 2018-02-08 NOTE — Discharge Instructions (Addendum)
PLEASE TAKE YOUR MEDICINE AS PRESCRIBED.  Please take your prescribed prednisone as directed.  Take Benadryl as prescribed.  Take Pepcid  as prescribed.  Follow up with your PCP this week.  You may benefit from seeing an allergist.  If you develop worsening or new concerning symptoms you can return to the emergency department for re-evaluation.

## 2018-02-08 NOTE — ED Triage Notes (Signed)
Triage nursing note via Translator: Pt states she might be having an allergic reaction, c/o throat tightness, generalized rash and feeling flushed. Seen and admitted for similar symptoms about 24 hours ago, but states she is now feeling worse especially in her throat area.

## 2018-02-08 NOTE — Plan of Care (Signed)
  Problem: Education: Goal: Knowledge of General Education information will improve Outcome: Progressing   Problem: Pain Managment: Goal: General experience of comfort will improve Outcome: Progressing   Problem: Safety: Goal: Ability to remain free from injury will improve Outcome: Progressing   

## 2018-02-09 ENCOUNTER — Encounter (HOSPITAL_COMMUNITY): Payer: Self-pay

## 2018-02-09 ENCOUNTER — Other Ambulatory Visit: Payer: Self-pay

## 2018-02-09 ENCOUNTER — Encounter: Payer: Self-pay | Admitting: Obstetrics & Gynecology

## 2018-02-09 ENCOUNTER — Observation Stay (HOSPITAL_COMMUNITY)
Admission: EM | Admit: 2018-02-09 | Discharge: 2018-02-10 | Disposition: A | Discharge status: Home / Self Care | Payer: Medicaid Other | Attending: Internal Medicine | Admitting: Internal Medicine

## 2018-02-09 DIAGNOSIS — T7840XA Allergy, unspecified, initial encounter: Secondary | ICD-10-CM

## 2018-02-09 DIAGNOSIS — R21 Rash and other nonspecific skin eruption: Principal | ICD-10-CM | POA: Diagnosis present

## 2018-02-09 DIAGNOSIS — Z8719 Personal history of other diseases of the digestive system: Secondary | ICD-10-CM | POA: Insufficient documentation

## 2018-02-09 DIAGNOSIS — J45909 Unspecified asthma, uncomplicated: Secondary | ICD-10-CM | POA: Insufficient documentation

## 2018-02-09 DIAGNOSIS — F419 Anxiety disorder, unspecified: Secondary | ICD-10-CM | POA: Insufficient documentation

## 2018-02-09 DIAGNOSIS — K760 Fatty (change of) liver, not elsewhere classified: Secondary | ICD-10-CM | POA: Insufficient documentation

## 2018-02-09 DIAGNOSIS — Z79899 Other long term (current) drug therapy: Secondary | ICD-10-CM | POA: Insufficient documentation

## 2018-02-09 DIAGNOSIS — R7303 Prediabetes: Secondary | ICD-10-CM | POA: Insufficient documentation

## 2018-02-09 DIAGNOSIS — R490 Dysphonia: Secondary | ICD-10-CM | POA: Insufficient documentation

## 2018-02-09 DIAGNOSIS — D72829 Elevated white blood cell count, unspecified: Secondary | ICD-10-CM | POA: Insufficient documentation

## 2018-02-09 DIAGNOSIS — F329 Major depressive disorder, single episode, unspecified: Secondary | ICD-10-CM | POA: Insufficient documentation

## 2018-02-09 DIAGNOSIS — E669 Obesity, unspecified: Secondary | ICD-10-CM | POA: Insufficient documentation

## 2018-02-09 DIAGNOSIS — Z6836 Body mass index (BMI) 36.0-36.9, adult: Secondary | ICD-10-CM | POA: Insufficient documentation

## 2018-02-09 LAB — CBC WITH DIFFERENTIAL/PLATELET
ABS IMMATURE GRANULOCYTES: 0.1 10*3/uL (ref 0.0–0.1)
BASOS ABS: 0 10*3/uL (ref 0.0–0.1)
Basophils Relative: 0 %
Eosinophils Absolute: 0 10*3/uL (ref 0.0–0.7)
Eosinophils Relative: 0 %
HCT: 36.9 % (ref 36.0–46.0)
HEMOGLOBIN: 11.9 g/dL — AB (ref 12.0–15.0)
IMMATURE GRANULOCYTES: 1 %
LYMPHS PCT: 20 %
Lymphs Abs: 4.7 10*3/uL — ABNORMAL HIGH (ref 0.7–4.0)
MCH: 29 pg (ref 26.0–34.0)
MCHC: 32.2 g/dL (ref 30.0–36.0)
MCV: 90 fL (ref 78.0–100.0)
MONO ABS: 1.6 10*3/uL — AB (ref 0.1–1.0)
MONOS PCT: 7 %
NEUTROS ABS: 16.4 10*3/uL — AB (ref 1.7–7.7)
NEUTROS PCT: 72 %
PLATELETS: 321 10*3/uL (ref 150–400)
RBC: 4.1 MIL/uL (ref 3.87–5.11)
RDW: 13 % (ref 11.5–15.5)
WBC: 22.9 10*3/uL — ABNORMAL HIGH (ref 4.0–10.5)

## 2018-02-09 LAB — CBG MONITORING, ED: Glucose-Capillary: 128 mg/dL — ABNORMAL HIGH (ref 65–99)

## 2018-02-09 LAB — COMPREHENSIVE METABOLIC PANEL
ALK PHOS: 79 U/L (ref 38–126)
ALT: 19 U/L (ref 14–54)
AST: 19 U/L (ref 15–41)
Albumin: 4 g/dL (ref 3.5–5.0)
Anion gap: 10 (ref 5–15)
BUN: 22 mg/dL — ABNORMAL HIGH (ref 6–20)
CALCIUM: 9.3 mg/dL (ref 8.9–10.3)
CHLORIDE: 107 mmol/L (ref 101–111)
CO2: 22 mmol/L (ref 22–32)
CREATININE: 1.03 mg/dL — AB (ref 0.44–1.00)
GFR calc non Af Amer: >60 mL/min (ref >60)
Glucose, Bld: 102 mg/dL — ABNORMAL HIGH (ref 65–99)
Potassium: 3.5 mmol/L (ref 3.5–5.1)
SODIUM: 139 mmol/L (ref 135–145)
Total Bilirubin: 0.4 mg/dL (ref 0.3–1.2)
Total Protein: 7.2 g/dL (ref 6.5–8.1)

## 2018-02-09 LAB — I-STAT TROPONIN, ED: Troponin i, poc: 0 ng/mL (ref 0.00–0.08)

## 2018-02-09 LAB — HEMOGLOBIN A1C
HEMOGLOBIN A1C: 5.3 % (ref 4.8–5.6)
MEAN PLASMA GLUCOSE: 105.41 mg/dL

## 2018-02-09 MED ORDER — ALBUTEROL SULFATE (2.5 MG/3ML) 0.083% IN NEBU: 3.0000 mL | INHALATION_SOLUTION | Freq: Four times a day (QID) | RESPIRATORY_TRACT | Status: DC | PRN

## 2018-02-09 MED ORDER — INSULIN ASPART 100 UNIT/ML ~~LOC~~ SOLN: 0.0000 [IU] | Freq: Every day | SUBCUTANEOUS | Status: DC

## 2018-02-09 MED ORDER — METHYLPREDNISOLONE SODIUM SUCC 125 MG IJ SOLR
80.0000 mg | Freq: Three times a day (TID) | INTRAMUSCULAR | Status: DC
  Administered 2018-02-09 – 2018-02-10 (×3): 80 mg via INTRAVENOUS
  Filled 2018-02-09 (×4): qty 2

## 2018-02-09 MED ORDER — ONDANSETRON 4 MG PO TBDP: 4.0000 mg | ORAL_TABLET | Freq: Three times a day (TID) | ORAL | Status: DC | PRN

## 2018-02-09 MED ORDER — DIPHENHYDRAMINE HCL 25 MG PO TABS
25.0000 mg | ORAL_TABLET | Freq: Four times a day (QID) | ORAL | Status: DC
  Filled 2018-02-09 (×2): qty 1

## 2018-02-09 MED ORDER — METHYLPREDNISOLONE SODIUM SUCC 125 MG IJ SOLR
125.0000 mg | Freq: Once | INTRAMUSCULAR | Status: AC
  Administered 2018-02-09: 125 mg via INTRAVENOUS
  Filled 2018-02-09: qty 2

## 2018-02-09 MED ORDER — PREDNISONE 20 MG PO TABS
60.0000 mg | ORAL_TABLET | Freq: Once | ORAL | Status: DC
  Filled 2018-02-09: qty 3

## 2018-02-09 MED ORDER — EPINEPHRINE 0.3 MG/0.3ML IJ SOAJ
INTRAMUSCULAR | Status: AC
Start: 2018-02-09 — End: 2018-02-09
  Administered 2018-02-09: 0.3 mg
  Filled 2018-02-09: qty 0.3

## 2018-02-09 MED ORDER — SODIUM CHLORIDE 0.9 % IV BOLUS
1000.0000 mL | Freq: Once | INTRAVENOUS | Status: AC
  Administered 2018-02-09: 1000 mL via INTRAVENOUS

## 2018-02-09 MED ORDER — SODIUM CHLORIDE 0.9% FLUSH
3.0000 mL | Freq: Two times a day (BID) | INTRAVENOUS | Status: DC
  Administered 2018-02-09 – 2018-02-10 (×2): 3 mL via INTRAVENOUS

## 2018-02-09 MED ORDER — DIPHENHYDRAMINE HCL 50 MG/ML IJ SOLN
25.0000 mg | Freq: Once | INTRAMUSCULAR | Status: AC
  Administered 2018-02-09: 25 mg via INTRAVENOUS
  Filled 2018-02-09: qty 1

## 2018-02-09 MED ORDER — SODIUM CHLORIDE 0.9% FLUSH: 3.0000 mL | INTRAVENOUS | Status: DC | PRN

## 2018-02-09 MED ORDER — DIPHENHYDRAMINE HCL 50 MG/ML IJ SOLN
25.0000 mg | Freq: Three times a day (TID) | INTRAMUSCULAR | Status: DC | PRN
  Administered 2018-02-09: 25 mg via INTRAVENOUS
  Filled 2018-02-09: qty 1

## 2018-02-09 MED ORDER — ENOXAPARIN SODIUM 40 MG/0.4ML ~~LOC~~ SOLN
40.0000 mg | SUBCUTANEOUS | Status: DC
  Administered 2018-02-09: 40 mg via SUBCUTANEOUS
  Filled 2018-02-09: qty 0.4

## 2018-02-09 MED ORDER — FLUTICASONE FUROATE-VILANTEROL 100-25 MCG/INH IN AEPB
1.0000 | INHALATION_SPRAY | Freq: Every day | RESPIRATORY_TRACT | Status: DC
  Filled 2018-02-09: qty 28

## 2018-02-09 MED ORDER — ACETAMINOPHEN 650 MG RE SUPP: 650.0000 mg | Freq: Four times a day (QID) | RECTAL | Status: DC | PRN

## 2018-02-09 MED ORDER — INSULIN ASPART 100 UNIT/ML ~~LOC~~ SOLN
0.0000 [IU] | Freq: Three times a day (TID) | SUBCUTANEOUS | Status: DC
  Administered 2018-02-10: 1 [IU] via SUBCUTANEOUS

## 2018-02-09 MED ORDER — ACETAMINOPHEN 325 MG PO TABS: 650.0000 mg | ORAL_TABLET | Freq: Four times a day (QID) | ORAL | Status: DC | PRN

## 2018-02-09 MED ORDER — FAMOTIDINE IN NACL 20-0.9 MG/50ML-% IV SOLN
20.0000 mg | Freq: Once | INTRAVENOUS | Status: AC
  Administered 2018-02-09: 20 mg via INTRAVENOUS
  Filled 2018-02-09: qty 50

## 2018-02-09 MED ORDER — SODIUM CHLORIDE 0.9 % IV SOLN: 250.0000 mL | INTRAVENOUS | Status: DC | PRN

## 2018-02-09 MED ORDER — FAMOTIDINE 20 MG PO TABS
40.0000 mg | ORAL_TABLET | Freq: Two times a day (BID) | ORAL | Status: DC
  Administered 2018-02-09 – 2018-02-10 (×2): 40 mg via ORAL
  Filled 2018-02-09 (×2): qty 2

## 2018-02-09 NOTE — H&P (Addendum)
TRH H&P   Patient Demographics:    Lorraine [name], is a 32 y.o. female  MRN: [id number]   DOB - [birth year]  Admit Date - 02/09/2018  Outpatient Primary MD for the patient is System, Ider Not In  Referring MD/NP/PA:  Coral Ceo  Outpatient Specialists:     Patient coming from:  home  Chief Complaint  Patient presents with  . Allergic Reaction  . Pruritis      HPI:    Lorraine [name]  is a 32 y.o. female, w glucose intolerance, asthma, presents with rash for the past 3 days.  Pt has been seen in the ED, and receive epi shot in the recent past as well as today.  She had recent admission 5/25, and was discharged on prednisone but only took 1st dose this am.  Pt denies any new lotion, detergent, soap, or medication. Pt can't tell me if has had ducts cleaned in their house.  Father seems to think might have some mold.   Pt still was having itching rash on the upper arms today and hoarseness and presented to ED.   In ED,  Wbc 22.9, Hgb 11.9, Plt 321 Na 139, K 3.5, Bun 22, Creatinine 1.03 Ast 19, Alt 19 Glucose 102  CXR 02/08/2018   Impression: No edema or consolidation  Pt will be admitted for rash, and hoarseness.            Review of systems:    In addition to the HPI above, No Fever-chills, No Headache, No changes with Vision or hearing, No problems swallowing food or Liquids, No Chest pain, Cough or Shortness of Breath No Abdominal pain, No Nausea or Vommitting, Bowel movements are regular, No Blood in stool or Urine, No dysuria,  No new joints pains-aches,  No new weakness, tingling, numbness in any extremity, No recent weight gain or loss, No polyuria, polydypsia or polyphagia, No significant Mental Stressors.  A full 10 point Review of Systems was done, except as stated above, all other Review of Systems were negative.   With Past  History of the following :    Past Medical History:  Diagnosis Date  . Asthma    Inhaler used 01/02/16  . Diverticulitis   . Nausea & vomiting 04/02/2017  . Pancreatitis   . Pre-diabetes   . Pyelonephritis   . Pyelonephritis       Past Surgical History:  Procedure Laterality Date  . CESAREAN SECTION    . CESAREAN SECTION N/A 2006  . CESAREAN SECTION N/A 09/03/2014   Procedure: CESAREAN SECTION;  Surgeon: Guss Bunde, MD;  Location: [facility];  Service: Obstetrics;  Laterality: N/A;  . CHOLECYSTECTOMY    . ESOPHAGOGASTRODUODENOSCOPY (EGD) WITH PROPOFOL Left 04/07/2017   Procedure: ESOPHAGOGASTRODUODENOSCOPY (EGD) WITH PROPOFOL;  Surgeon: Ronnette Juniper, MD;  Location: [facility];  Service: Gastroenterology;  Laterality: Left;  . TUBAL LIGATION  Social History:     Social History   Tobacco Use  . Smoking status: Never Smoker  . Smokeless tobacco: Never Used  Substance Use Topics  . Alcohol use: No     Lives - at home  Mobility - walks by self   Family History :     Family History  Problem Relation Age of Onset  . Hyperlipidemia Mother   . Diabetes Maternal Uncle   . Diabetes Paternal Grandmother       Home Medications:   Prior to Admission medications   Medication Sig Start Date End Date Taking? Authorizing Provider  albuterol (PROVENTIL HFA;VENTOLIN HFA) 108 (90 Base) MCG/ACT inhaler Inhale 2 puffs into the lungs every 6 (six) hours as needed for wheezing or shortness of breath.   Yes [provider]  diphenhydrAMINE (BENADRYL) 25 MG tablet Take 1 tablet (25 mg total) by mouth every 6 (six) hours. 02/08/18  Yes Maczis, Barth Kirks, PA-C  famotidine (PEPCID) 40 MG tablet Take 1 tablet (40 mg total) by mouth 2 (two) times daily. 02/08/18  Yes Maczis, Barth Kirks, PA-C  ibuprofen (ADVIL,MOTRIN) 200 MG tablet Take 400 mg by mouth every 6 (six) hours as needed for moderate pain.   Yes [provider]  ondansetron (ZOFRAN ODT) 4 MG  disintegrating tablet Take 1 tablet (4 mg total) by mouth every 8 (eight) hours as needed for nausea or vomiting. 02/02/18  Yes Street, [place], PA-C  predniSONE (DELTASONE) 20 MG tablet Take 3 tablets (60 mg total) by mouth daily with breakfast. 02/09/18  Yes Maczis, Barth Kirks, PA-C  ranitidine (ZANTAC) 150 MG tablet Take 1 tablet (150 mg total) by mouth 2 (two) times daily. 02/02/18  Yes Street, [place], PA-C  sucralfate (CARAFATE) 1 GM/10ML suspension Take 10 mLs (1 g total) by mouth 4 (four) times daily -  with meals and at bedtime. 02/02/18  Yes Street, [place], PA-C  dicyclomine (BENTYL) 20 MG tablet Take 1 tablet (20 mg total) by mouth every 12 (twelve) hours as needed (for abdominal pain/cramping). Patient not taking: Reported on 01/03/2018 12/22/17   Antonietta Breach, PA-C  metoCLOPramide (REGLAN) 10 MG tablet Take 1 tablet (10 mg total) by mouth every 6 (six) hours as needed for nausea (nausea/headache). Label in Spanish Patient not taking: Reported on 02/02/2018 01/04/18   Orlie Dakin, MD  ondansetron (ZOFRAN) 8 MG tablet Take 1 tablet (8 mg total) by mouth every 8 (eight) hours as needed for nausea. Patient not taking: Reported on 12/21/2017 10/14/17   Orlie Dakin, MD  promethazine (PHENERGAN) 25 MG tablet Take 1 tablet (25 mg total) by mouth every 6 (six) hours as needed for nausea or vomiting. Patient not taking: Reported on 01/03/2018 12/22/17   Antonietta Breach, PA-C     Allergies:    No Known Allergies   Physical Exam:   Vitals  Blood pressure (!) 125/96, pulse 80, temperature 98.4 F (36.9 C), temperature source Oral, resp. rate 16, last menstrual period 01/20/2018, SpO2 100 %.   1. General lying in bed in NAD,    2. Normal affect and insight, Not Suicidal or Homicidal, Awake Alert, Oriented X 3.  3. No F.N deficits, ALL C.Nerves Intact, Strength 5/5 all 4 extremities, Sensation intact all 4 extremities, Plantars down going.  4. Ears and Eyes appear Normal, Conjunctivae clear,  PERRLA. Moist Oral Mucosa.  5. Supple Neck, No JVD, No cervical lymphadenopathy appriciated, No Carotid Bruits.  6. Symmetrical Chest wall movement, Good air movement bilaterally, CTAB.  7.  RRR, No Gallops, Rubs or Murmurs, No Parasternal Heave.  8. Positive Bowel Sounds, Abdomen Soft, No tenderness, No organomegaly appriciated,No rebound -guarding or rigidity.  9.  No Cyanosis, Normal Skin Turgor, slight maculopapular rash on the upper arms bilaterally and excoriations  10. Good muscle tone,  joints appear normal , no effusions, Normal ROM.  11. No Palpable Lymph Nodes in Neck or Axillae     Data Review:    CBC Recent Labs  Lab 02/07/18 2242 02/07/18 2300 02/08/18 2005 02/09/18 1619  WBC  --  10.1 12.2* 22.9*  HGB 12.9 13.2 12.5 11.9*  HCT 38.0 39.0 37.8 36.9  PLT  --  331 345 321  MCV  --  89.0 89.8 90.0  MCH  --  30.1 29.7 29.0  MCHC  --  33.8 33.1 32.2  RDW  --  12.7 13.1 13.0  LYMPHSABS  --  1.2 2.5 4.7*  MONOABS  --  0.1 0.2 1.6*  EOSABS  --  0.0 0.0 0.0  BASOSABS  --  0.0 0.0 0.0   ------------------------------------------------------------------------------------------------------------------  Chemistries  Recent Labs  Lab 02/07/18 2242 02/08/18 0035 02/08/18 2005 02/09/18 1619  NA 138 140 141 139  K 6.5* 3.7 3.4* 3.5  CL 108 109 107 107  CO2  --  21* 20* 22  GLUCOSE 135* 159* 203* 102*  BUN 16 15 22* 22*  CREATININE 0.60 0.76 0.97 1.03*  CALCIUM  --  9.4 9.4 9.3  AST  --   --   --  19  ALT  --   --   --  19  ALKPHOS  --   --   --  79  BILITOT  --   --   --  0.4   ------------------------------------------------------------------------------------------------------------------ estimated creatinine clearance is 77.1 mL/min (A) (by C-G formula based on SCr of 1.03 mg/dL (H)). ------------------------------------------------------------------------------------------------------------------ No results for input(s): TSH, T4TOTAL, T3FREE, THYROIDAB  in the last 72 hours.  Invalid input(s): FREET3  Coagulation profile No results for input(s): INR, PROTIME in the last 168 hours. ------------------------------------------------------------------------------------------------------------------- No results for input(s): DDIMER in the last 72 hours. -------------------------------------------------------------------------------------------------------------------  Cardiac Enzymes Recent Labs  Lab 02/08/18 0035 02/08/18 0606 02/08/18 1209  TROPONINI <0.03 <0.03 <0.03   ------------------------------------------------------------------------------------------------------------------ No results found for: BNP   ---------------------------------------------------------------------------------------------------------------  Urinalysis    Component Value Date/Time   COLORURINE YELLOW 02/02/2018 1755   APPEARANCEUR HAZY (A) 02/02/2018 1755   LABSPEC 1.026 02/02/2018 1755   PHURINE 6.0 02/02/2018 1755   GLUCOSEU NEGATIVE 02/02/2018 1755   HGBUR NEGATIVE 02/02/2018 1755   BILIRUBINUR NEGATIVE 02/02/2018 1755   BILIRUBINUR neg 07/15/2015 1026   KETONESUR 5 (A) 02/02/2018 1755   PROTEINUR NEGATIVE 02/02/2018 1755   UROBILINOGEN 0.2 09/27/2017 1922   NITRITE NEGATIVE 02/02/2018 1755   LEUKOCYTESUR NEGATIVE 02/02/2018 1755    ----------------------------------------------------------------------------------------------------------------   Imaging Results:    Dg Chest 2 View  Result Date: 02/08/2018 CLINICAL DATA:  Rash and flushing sensation. EXAM: CHEST - 2 VIEW COMPARISON:  Feb 08, 2018 study obtained earlier in the day. FINDINGS: Lungs are clear. Heart size and pulmonary vascularity are normal. No adenopathy. No pneumothorax. No bone lesions. IMPRESSION: No edema or consolidation. Electronically Signed   By: Lowella Grip III M.D.   On: 02/08/2018 20:58   Dg Chest 2 View  Result Date: 02/08/2018 CLINICAL DATA:  Central  chest pain, short of breath EXAM: CHEST - 2 VIEW COMPARISON:  02/02/2018, 01/03/2018 FINDINGS: The heart size and mediastinal contours are within normal  limits. Both lungs are clear. The visualized skeletal structures are unremarkable. IMPRESSION: No active cardiopulmonary disease. Electronically Signed   By: Donavan Foil M.D.   On: 02/08/2018 02:07       Assessment & Plan:    Principal Problem:   Rash   Rash Solumedrol 80mg  iv q8h  Asthma Start on Breo 1puff qday Albuterol neb q6h prn  Glucose intolerance Check hga1c fsbs ac and qhs , ISS  Leukocytosis Check cbc in am  DVT Prophylaxis  Lovenox - SCDs   AM Labs Ordered, also please review Full Orders  Family Communication: Admission, patients condition and plan of care including tests being ordered have been discussed with the patient and  who indicate understanding and agree with the plan and Code Status.  Code Status  FULL CODE  Likely DC to  none  Condition GUARDED    Consults called: none  Admission status: obs   Time spent in minutes : 45   Jani Gravel M.D on 02/09/2018 at 8:01 PM  Between 7am to 7pm - Pager - [phone]   After 7pm go to [email or web address] - password [email or web address]  [facility] - Office  [phone]

## 2018-02-09 NOTE — ED Triage Notes (Signed)
Translator/ pt stated, I was here yesterday for the same symptoms as yesterday. The symptoms started 3 days ago.  Given Benadryl back for the itching and she also has a sore throat and hard to swallow

## 2018-02-09 NOTE — ED Provider Notes (Addendum)
[place] EMERGENCY DEPARTMENT Provider Note   CSN: [id number] Arrival date & time: 02/09/18  1439     History   Chief Complaint Chief Complaint  Patient presents with  . Allergic Reaction  . Pruritis    HPI Lorraine [name] is a 32 y.o. female.  HPI   Patient is a 32 year old female with a history of asthma who presents the ED today complaining of sore throat, feeling of throat swelling, difficulty  breathing, itchy pruritic rash which she states developed about 30-40 minutes prior to arrival.  Patient unable to speak and is typing all of this on her cell phone during the interview.  Patient has been seen twice for similar symptoms this week and was admitted to the hospital 2 days ago after receiving 2 rounds of epinephrine.  She was also seen in the ED yesterday and received epinephrine.  She is observed for 4 hours and was discharged in stable condition.  Was sent home with Benadryl, Pepcid, steroids.  She states that she took all of his medications this morning around 6 AM, then she developed her current symptoms about 6 hours later.  Denies any chest pain, fevers, cough, congestion, or any other symptoms.  According to prior notes and based on history today patient denies any recent changes in her environment that could be causing her symptoms.  Past Medical History:  Diagnosis Date  . Asthma    Inhaler used 01/02/16  . Diverticulitis   . Nausea & vomiting 04/02/2017  . Pancreatitis   . Pre-diabetes   . Pyelonephritis   . Pyelonephritis     Patient Active Problem List   Diagnosis Date Noted  . Rash 02/09/2018  . Chest pain 02/08/2018  . Acute pancreatitis 04/02/2017  . Abdominal pain, acute, left upper quadrant 04/02/2017  . Intractable nausea and vomiting 04/02/2017  . Asthma in adult 04/02/2017  . Obesity (BMI 30.0-34.9) 04/02/2017  . Fatty infiltration of liver 04/02/2017  . Gastroesophageal reflux disease without esophagitis   .  Pre-diabetes 07/15/2015  . Diarrhea   . Hypokalemia   . Bacterial vaginosis   . Pyelonephritis 07/07/2015  . Abdominal pain, right upper quadrant   . Asthma 10/31/2014  . History of preterm delivery 10/22/2014  . Status post repeat low transverse cesarean section 09/06/2014  . Diverticulitis 09/10/2012    Past Surgical History:  Procedure Laterality Date  . CESAREAN SECTION    . CESAREAN SECTION N/A 2006  . CESAREAN SECTION N/A 09/03/2014   Procedure: CESAREAN SECTION;  Surgeon: Guss Bunde, MD;  Location: [facility];  Service: Obstetrics;  Laterality: N/A;  . CHOLECYSTECTOMY    . ESOPHAGOGASTRODUODENOSCOPY (EGD) WITH PROPOFOL Left 04/07/2017   Procedure: ESOPHAGOGASTRODUODENOSCOPY (EGD) WITH PROPOFOL;  Surgeon: Ronnette Juniper, MD;  Location: [facility];  Service: Gastroenterology;  Laterality: Left;  . TUBAL LIGATION       OB History    Gravida  5   Para  3   Term  1   Preterm  2   AB  2   Living  3     SAB  2   TAB      Ectopic      Multiple  1   Live Births  3            Home Medications    Prior to Admission medications   Medication Sig Start Date End Date Taking? Authorizing Provider  albuterol (PROVENTIL HFA;VENTOLIN HFA) 108 (90 Base) MCG/ACT inhaler Inhale 2 puffs  into the lungs every 6 (six) hours as needed for wheezing or shortness of breath.   Yes [provider]  diphenhydrAMINE (BENADRYL) 25 MG tablet Take 1 tablet (25 mg total) by mouth every 6 (six) hours. 02/08/18  Yes Maczis, Barth Kirks, PA-C  famotidine (PEPCID) 40 MG tablet Take 1 tablet (40 mg total) by mouth 2 (two) times daily. 02/08/18  Yes Maczis, Barth Kirks, PA-C  ibuprofen (ADVIL,MOTRIN) 200 MG tablet Take 400 mg by mouth every 6 (six) hours as needed for moderate pain.   Yes [provider]  ondansetron (ZOFRAN ODT) 4 MG disintegrating tablet Take 1 tablet (4 mg total) by mouth every 8 (eight) hours as needed for nausea or vomiting. 02/02/18  Yes Street, [place],  PA-C  predniSONE (DELTASONE) 20 MG tablet Take 3 tablets (60 mg total) by mouth daily with breakfast. 02/09/18  Yes Maczis, Barth Kirks, PA-C  ranitidine (ZANTAC) 150 MG tablet Take 1 tablet (150 mg total) by mouth 2 (two) times daily. 02/02/18  Yes Street, [place], PA-C  sucralfate (CARAFATE) 1 GM/10ML suspension Take 10 mLs (1 g total) by mouth 4 (four) times daily -  with meals and at bedtime. 02/02/18  Yes Street, [place], PA-C  dicyclomine (BENTYL) 20 MG tablet Take 1 tablet (20 mg total) by mouth every 12 (twelve) hours as needed (for abdominal pain/cramping). Patient not taking: Reported on 01/03/2018 12/22/17   Antonietta Breach, PA-C  metoCLOPramide (REGLAN) 10 MG tablet Take 1 tablet (10 mg total) by mouth every 6 (six) hours as needed for nausea (nausea/headache). Label in Spanish Patient not taking: Reported on 02/02/2018 01/04/18   Orlie Dakin, MD  ondansetron (ZOFRAN) 8 MG tablet Take 1 tablet (8 mg total) by mouth every 8 (eight) hours as needed for nausea. Patient not taking: Reported on 12/21/2017 10/14/17   Orlie Dakin, MD  promethazine (PHENERGAN) 25 MG tablet Take 1 tablet (25 mg total) by mouth every 6 (six) hours as needed for nausea or vomiting. Patient not taking: Reported on 01/03/2018 12/22/17   Antonietta Breach, PA-C    Family History Family History  Problem Relation Age of Onset  . Hyperlipidemia Mother   . Diabetes Maternal Uncle   . Diabetes Paternal Grandmother     Social History Social History   Tobacco Use  . Smoking status: Never Smoker  . Smokeless tobacco: Never Used  Substance Use Topics  . Alcohol use: No  . Drug use: No     Allergies   Patient has no known allergies.   Review of Systems Review of Systems  Constitutional: Negative for chills and fever.  HENT: Positive for sore throat, trouble swallowing and voice change. Negative for congestion and rhinorrhea.   Eyes: Negative for visual disturbance.  Respiratory: Positive for shortness of breath.     Cardiovascular: Negative for chest pain and palpitations.  Gastrointestinal: Negative for abdominal pain, constipation, diarrhea, nausea and vomiting.  Genitourinary: Negative for flank pain.  Musculoskeletal: Negative for back pain.  Skin: Positive for rash.  Neurological: Negative for dizziness, weakness, numbness and headaches.  All other systems reviewed and are negative.  Physical Exam Updated Vital Signs BP 117/70 (BP Location: Left Arm)   Pulse 75   Temp 98.7 F (37.1 C) (Oral)   Resp 18   Ht 5' (1.524 m)   Wt 85.4 kg (188 lb 4.4 oz)   LMP 01/20/2018   SpO2 99%   BMI 36.77 kg/m   Physical Exam  Constitutional: She appears well-developed and well-nourished.  Patient in distress.  HENT:  Head: Normocephalic and atraumatic.  Mouth/Throat: Oropharynx is clear and moist.  No obvious evidence of angioedema or tongue swelling.  Patient is whispering and cannot talk.  Eyes: Pupils are equal, round, and reactive to light. Conjunctivae and EOM are normal.  Neck: Neck supple.  Cardiovascular: Normal rate, regular rhythm and normal heart sounds.  No murmur heard. Pulmonary/Chest: Breath sounds normal. No stridor. She has no wheezes.  tachypneic  Abdominal: Soft. Bowel sounds are normal. There is no tenderness. There is no guarding.  Musculoskeletal: She exhibits no edema.  Neurological: She is alert.  Skin: Skin is warm and dry. Capillary refill takes less than 2 seconds.  Urticarial rash to the right upper extremity that patient is actually itching.  Psychiatric: She has a normal mood and affect.  Nursing note and vitals reviewed.  ED Treatments / Results  Labs (all labs ordered are listed, but only abnormal results are displayed) Labs Reviewed  CBC WITH DIFFERENTIAL/PLATELET - Abnormal; Notable for the following components:      Result Value   WBC 22.9 (*)    Hemoglobin 11.9 (*)    Neutro Abs 16.4 (*)    Lymphs Abs 4.7 (*)    Monocytes Absolute 1.6 (*)    All  other components within normal limits  COMPREHENSIVE METABOLIC PANEL - Abnormal; Notable for the following components:   Glucose, Bld 102 (*)    BUN 22 (*)    Creatinine, Ser 1.03 (*)    All other components within normal limits  CBG MONITORING, ED - Abnormal; Notable for the following components:   Glucose-Capillary 128 (*)    All other components within normal limits  COMPREHENSIVE METABOLIC PANEL  CBC  HEMOGLOBIN A1C  I-STAT TROPONIN, ED    EKG None  Radiology Dg Chest 2 View  Result Date: 02/08/2018 CLINICAL DATA:  Rash and flushing sensation. EXAM: CHEST - 2 VIEW COMPARISON:  Feb 08, 2018 study obtained earlier in the day. FINDINGS: Lungs are clear. Heart size and pulmonary vascularity are normal. No adenopathy. No pneumothorax. No bone lesions. IMPRESSION: No edema or consolidation. Electronically Signed   By: Lowella Grip III M.D.   On: 02/08/2018 20:58   Dg Chest 2 View  Result Date: 02/08/2018 CLINICAL DATA:  Central chest pain, short of breath EXAM: CHEST - 2 VIEW COMPARISON:  02/02/2018, 01/03/2018 FINDINGS: The heart size and mediastinal contours are within normal limits. Both lungs are clear. The visualized skeletal structures are unremarkable. IMPRESSION: No active cardiopulmonary disease. Electronically Signed   By: Donavan Foil M.D.   On: 02/08/2018 02:07    Procedures Procedures (including critical care time) CRITICAL CARE Performed by: Rodney Booze   Total critical care time: 31 minutes  Critical care time was exclusive of separately billable procedures and treating other patients.  Critical care was necessary to treat or prevent imminent or life-threatening deterioration.  Critical care was time spent personally by me on the following activities: development of treatment plan with patient and/or surrogate as well as nursing, discussions with consultants, evaluation of patient's response to treatment, examination of patient, obtaining history from  patient or surrogate, ordering and performing treatments and interventions, ordering and review of laboratory studies, ordering and review of radiographic studies, pulse oximetry and re-evaluation of patient's condition.   Medications Ordered in ED Medications  methylPREDNISolone sodium succinate (SOLU-MEDROL) 125 mg/2 mL injection 80 mg (has no administration in time range)  enoxaparin (LOVENOX) injection 40 mg (has no administration  in time range)  sodium chloride flush (NS) 0.9 % injection 3 mL (has no administration in time range)  sodium chloride flush (NS) 0.9 % injection 3 mL (has no administration in time range)  0.9 %  sodium chloride infusion (has no administration in time range)  acetaminophen (TYLENOL) tablet 650 mg (has no administration in time range)    Or  acetaminophen (TYLENOL) suppository 650 mg (has no administration in time range)  fluticasone furoate-vilanterol (BREO ELLIPTA) 100-25 MCG/INH 1 puff (has no administration in time range)  albuterol (PROVENTIL) (2.5 MG/3ML) 0.083% nebulizer solution 3 mL (has no administration in time range)  insulin aspart (novoLOG) injection 0-9 Units (has no administration in time range)  insulin aspart (novoLOG) injection 0-5 Units (has no administration in time range)  EPINEPHrine (EPI-PEN) 0.3 mg/0.3 mL injection (0.3 mg  Given 02/09/18 1607)  diphenhydrAMINE (BENADRYL) injection 25 mg (25 mg Intravenous Given 02/09/18 1625)  famotidine (PEPCID) IVPB 20 mg premix (0 mg Intravenous Stopped 02/09/18 1708)  sodium chloride 0.9 % bolus 1,000 mL (1,000 mLs Intravenous New Bag/Given 02/09/18 1723)  methylPREDNISolone sodium succinate (SOLU-MEDROL) 125 mg/2 mL injection 125 mg (125 mg Intravenous Given 02/09/18 1708)     Initial Impression / Assessment and Plan / ED Course  I have reviewed the triage vital signs and the nursing notes.  Pertinent labs & imaging results that were available during my care of the patient were reviewed by me and  considered in my medical decision making (see chart for details).    Discussed pt presentation, exam findings, and workup with Dr. Thomasene Lot, who recommends admitting the patient for scheduled IV steroids and further observation.   Rechecked patient after administration of epinephrine.  She states she feels improved.  She is satting 100% on room air.  In her rash seems to be grossly resolved she is no longer itching.  She does appear to be in any distress.  Lung sounds are clear.  7:41 PM CONSULT with Dr. Maudie Mercury who will admit the patient for observation.   Final Clinical Impressions(s) / ED Diagnoses   Final diagnoses:  Allergic reaction, initial encounter   Patient is a 32 year old female who presented to ED today with urticarial rash and complaints of throat swelling and inability to speak.  She is tachycardic and tachypneic initially.  Her pressure is stable.  She is afebrile satting at 100% on room air.  There is no obvious angioedema however patient is unable to speak.  She has urticarial rash to the BUE, worse on the right upper extremity.   Her pulmonary exam is without obvious wheezes.  Abdominal exam is benign.  She was given epinephrine, Benadryl, Pepcid, Solu-Medrol and reports some relief.   Patient with leukocytosis to 22 likely secondary to receiving multiple steroids in the last several days.  Her hemoglobin is 11.9.  CMP with mild elevation in creatinine 1.03.  I-STAT troponin is 0.  Her ECG shows normal sinus rhythm with heart rate of 84 she has borderline T wave abnormalities but no significant changes to suggest ischemia or infarction. She feels improved after receiving epinephrine as well as benadryl, pepcid, and solumedrol however feel that she will require admission for further monitoring as she is having cyclic episodes of pleuritic, hives-like rash with associated reported airway symptoms that appears to have failed outpatient therapy at this point. It is unclear what environmental  trigger is causing her symptoms.   Pt admitted to hospitalist service for further observation and management.   ED  Discharge Orders    None       Rodney Booze, PA-C 02/09/18 2043    Rodney Booze, PA-C 02/09/18 2336    Macarthur Critchley, MD 02/14/18 [phone]

## 2018-02-09 NOTE — ED Notes (Signed)
Discharge instructions reviewed with patient via [place]. Patient verbalizes understanding regarding prescriptions, Follow-up care and when to return to for new or worsening symptoms. VSS, pt a/o x4, ambulatory upon discharge. Leaving with family.

## 2018-02-09 NOTE — ED Triage Notes (Signed)
Pt presents for evaluation of possible allergic reaction. Pt scratching in triage, stating she cannot speak because of swelling throat. Pt seen and admitted at [facility] x 2 for same. States was given injection yesterday.

## 2018-02-10 DIAGNOSIS — F419 Anxiety disorder, unspecified: Secondary | ICD-10-CM

## 2018-02-10 LAB — COMPREHENSIVE METABOLIC PANEL
ALK PHOS: 76 U/L (ref 38–126)
ALT: 18 U/L (ref 14–54)
ANION GAP: 10 (ref 5–15)
AST: 18 U/L (ref 15–41)
Albumin: 3.7 g/dL (ref 3.5–5.0)
BILIRUBIN TOTAL: 0.4 mg/dL (ref 0.3–1.2)
BUN: 15 mg/dL (ref 6–20)
CALCIUM: 9.2 mg/dL (ref 8.9–10.3)
CO2: 23 mmol/L (ref 22–32)
Chloride: 107 mmol/L (ref 101–111)
Creatinine, Ser: 0.77 mg/dL (ref 0.44–1.00)
GFR calc non Af Amer: >60 mL/min (ref >60)
Glucose, Bld: 169 mg/dL — ABNORMAL HIGH (ref 65–99)
Potassium: 3.6 mmol/L (ref 3.5–5.1)
Sodium: 140 mmol/L (ref 135–145)
TOTAL PROTEIN: 6.9 g/dL (ref 6.5–8.1)

## 2018-02-10 LAB — GLUCOSE, CAPILLARY
GLUCOSE-CAPILLARY: 117 mg/dL — AB (ref 65–99)
Glucose-Capillary: 130 mg/dL — ABNORMAL HIGH (ref 65–99)
Glucose-Capillary: 132 mg/dL — ABNORMAL HIGH (ref 65–99)

## 2018-02-10 LAB — CBC
HCT: 35.2 % — ABNORMAL LOW (ref 36.0–46.0)
HEMOGLOBIN: 11.6 g/dL — AB (ref 12.0–15.0)
MCH: 30.1 pg (ref 26.0–34.0)
MCHC: 33 g/dL (ref 30.0–36.0)
MCV: 91.4 fL (ref 78.0–100.0)
PLATELETS: 287 10*3/uL (ref 150–400)
RBC: 3.85 MIL/uL — AB (ref 3.87–5.11)
RDW: 13.1 % (ref 11.5–15.5)
WBC: 10.9 10*3/uL — ABNORMAL HIGH (ref 4.0–10.5)

## 2018-02-10 MED ORDER — DIPHENHYDRAMINE HCL 12.5 MG/5ML PO ELIX
25.0000 mg | ORAL_SOLUTION | Freq: Four times a day (QID) | ORAL | Status: DC
  Filled 2018-02-10: qty 10

## 2018-02-10 MED ORDER — LORAZEPAM 0.5 MG PO TABS
0.5000 mg | ORAL_TABLET | Freq: Once | ORAL | Status: AC
  Administered 2018-02-10: 0.5 mg via ORAL
  Filled 2018-02-10: qty 1

## 2018-02-10 MED ORDER — BUSPIRONE HCL 10 MG PO TABS
5.0000 mg | ORAL_TABLET | Freq: Two times a day (BID) | ORAL | Status: DC
  Administered 2018-02-10: 5 mg via ORAL
  Filled 2018-02-10 (×2): qty 1

## 2018-02-10 MED ORDER — DIPHENHYDRAMINE HCL 25 MG PO TABS: 25.0000 mg | ORAL_TABLET | Freq: Four times a day (QID) | ORAL | 0 refills | Status: DC | PRN

## 2018-02-10 MED ORDER — DIPHENHYDRAMINE HCL 25 MG PO CAPS
25.0000 mg | ORAL_CAPSULE | Freq: Four times a day (QID) | ORAL | Status: DC
  Administered 2018-02-10 (×3): 25 mg via ORAL
  Filled 2018-02-10 (×3): qty 1

## 2018-02-10 MED ORDER — BUSPIRONE HCL 5 MG PO TABS: 5.0000 mg | ORAL_TABLET | Freq: Two times a day (BID) | ORAL | 0 refills | Status: DC

## 2018-02-10 MED ORDER — FLUTICASONE FUROATE-VILANTEROL 100-25 MCG/INH IN AEPB: 1.0000 | INHALATION_SPRAY | Freq: Every day | RESPIRATORY_TRACT | 0 refills | Status: DC
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
## 2018-02-10 NOTE — Care Management Note (Signed)
Case Management Note  Patient Details  Name: Amayia Ciano MRN: [id number] Date of Birth: 17-Mar-1986  Subjective/Objective:                    Action/Plan:  Consult to schedule appointment at [facility]. Same closed today . Information placed on discharge paperwork  Expected Discharge Date:                  Expected Discharge Plan:  Home/Self Care  In-House Referral:  Clinical Social Work  Discharge planning Services  CM Consult  Post Acute Care Choice:  NA Choice offered to:     DME Arranged:  N/A DME Agency:  NA  HH Arranged:  NA HH Agency:  NA  Status of Service:  Completed, signed off  If discussed at [place] of Stay Meetings, dates discussed:    Additional Comments:  Marilu Favre, RN 02/10/2018, 9:36 AM

## 2018-02-10 NOTE — Discharge Summary (Signed)
Physician Discharge Summary  Lorraine [name] PJA:[id number] DOB: [birth year] DOA: 02/09/2018  PCP: System, Pcp Not In  Admit date: 02/09/2018 Discharge date: 02/10/2018  Admitted From: home Discharge disposition: home   Recommendations for Outpatient Follow-Up:   1. Needs TSH check   Discharge Diagnosis:   Principal Problem:   Rash    Discharge Condition: Improved.  Diet recommendation:  Carbohydrate-modified  Wound care: None.  Code status: Full.   History of Present Illness:   Lorraine [name]  is a 32 y.o. female, w glucose intolerance, asthma, presents with rash for the past 3 days.  Pt has been seen in the ED, and receive epi shot in the recent past as well as today.  She had recent admission 5/25, and was discharged on prednisone but only took 1st dose this am.  Pt denies any new lotion, detergent, soap, or medication. Pt can't tell me if has had ducts cleaned in their house.  Father seems to think might have some mold.      Hospital Course by Problem:   Anxiety/depression -need counselling -had her seen by social work-- no abuse but many stressors including children -12 visits at [facility] to ER/inpt.  More to [facility] -started buspar- titrate as tolerated  Rash Resume prednisone taper from prior -NO rash seen today  Asthma Started on Breo 1puff qday per admitting MD Encouraged weight losee  Leukocytosis On steroids No sign of infection     Medical Consultants:      Discharge Exam:   Vitals:   02/10/18 0726 02/10/18 1307  BP: (!) 145/92 127/83  Pulse: 67 94  Resp: 19 16  Temp: 98.6 F (37 C) (!) 96.3 F (35.7 C)  SpO2: 99%    Vitals:   02/09/18 2036 02/10/18 0520 02/10/18 0726 02/10/18 1307  BP: 117/70 123/77 (!) 145/92 127/83  Pulse: 75 76 67 94  Resp: 18 19 19 16   Temp: 98.7 F (37.1 C) 98.4 F (36.9 C) 98.6 F (37 C) (!) 96.3 F (35.7 C)  TempSrc: Oral Oral Oral Oral  SpO2: 99% 99% 99%   Weight: 85.4 kg (188  lb 4.4 oz) 85.4 kg (188 lb 4.4 oz)    Height: 5' (1.524 m)       General exam: anxious appearing, voice  Soft but seems forced-- evasive with some answers  The results of significant diagnostics from this hospitalization (including imaging, microbiology, ancillary and laboratory) are listed below for reference.     Procedures and Diagnostic Studies:   No results found.   Labs:   Basic Metabolic Panel: Recent Labs  Lab 02/07/18 2242 02/08/18 0035 02/08/18 2005 02/09/18 1619 02/10/18 0502  NA 138 140 141 139 140  K 6.5* 3.7 3.4* 3.5 3.6  CL 108 109 107 107 107  CO2  --  21* 20* 22 23  GLUCOSE 135* 159* 203* 102* 169*  BUN 16 15 22* 22* 15  CREATININE 0.60 0.76 0.97 1.03* 0.77  CALCIUM  --  9.4 9.4 9.3 9.2   GFR Estimated Creatinine Clearance: 98.9 mL/min (by C-G formula based on SCr of 0.77 mg/dL). Liver Function Tests: Recent Labs  Lab 02/09/18 1619 02/10/18 0502  AST 19 18  ALT 19 18  ALKPHOS 79 76  BILITOT 0.4 0.4  PROT 7.2 6.9  ALBUMIN 4.0 3.7   No results for input(s): LIPASE, AMYLASE in the last 168 hours. No results for input(s): AMMONIA in the last 168 hours. Coagulation profile No results for input(s): INR,  PROTIME in the last 168 hours.  CBC: Recent Labs  Lab 02/07/18 2242 02/07/18 2300 02/08/18 2005 02/09/18 1619 02/10/18 0502  WBC  --  10.1 12.2* 22.9* 10.9*  NEUTROABS  --  8.9* 9.5* 16.4*  --   HGB 12.9 13.2 12.5 11.9* 11.6*  HCT 38.0 39.0 37.8 36.9 35.2*  MCV  --  89.0 89.8 90.0 91.4  PLT  --  331 345 321 287   Cardiac Enzymes: Recent Labs  Lab 02/08/18 0035 02/08/18 0606 02/08/18 1209  TROPONINI <0.03 <0.03 <0.03   BNP: Invalid input(s): POCBNP CBG: Recent Labs  Lab 02/09/18 2020 02/10/18 0742 02/10/18 1207  GLUCAP 128* 117* 130*   D-Dimer No results for input(s): DDIMER in the last 72 hours. Hgb A1c Recent Labs    02/07/18 2300 02/09/18 1619  HGBA1C 5.5 5.3   Lipid Profile No results for input(s): CHOL, HDL,  LDLCALC, TRIG, CHOLHDL, LDLDIRECT in the last 72 hours. Thyroid function studies No results for input(s): TSH, T4TOTAL, T3FREE, THYROIDAB in the last 72 hours.  Invalid input(s): FREET3 Anemia work up No results for input(s): VITAMINB12, FOLATE, FERRITIN, TIBC, IRON, RETICCTPCT in the last 72 hours. Microbiology No results found for this or any previous visit (from the past 240 hour(s)).   Discharge Instructions:   Discharge Instructions    Diet general   Complete by:  As directed    Discharge instructions   Complete by:  As directed    Be sure to establish with PCP And counseling for stress and anxiety management   Increase activity slowly   Complete by:  As directed      Allergies as of 02/10/2018   No Known Allergies     Medication List    STOP taking these medications   dicyclomine 20 MG tablet Commonly known as:  BENTYL   metoCLOPramide 10 MG tablet Commonly known as:  REGLAN   ondansetron 8 MG tablet Commonly known as:  ZOFRAN   promethazine 25 MG tablet Commonly known as:  PHENERGAN   ranitidine 150 MG tablet Commonly known as:  ZANTAC     TAKE these medications   albuterol 108 (90 Base) MCG/ACT inhaler Commonly known as:  PROVENTIL HFA;VENTOLIN HFA Inhale 2 puffs into the lungs every 6 (six) hours as needed for wheezing or shortness of breath.   busPIRone 5 MG tablet Commonly known as:  BUSPAR Take 1 tablet (5 mg total) by mouth 2 (two) times daily.   diphenhydrAMINE 25 MG tablet Commonly known as:  BENADRYL Take 1 tablet (25 mg total) by mouth every 6 (six) hours as needed for itching or allergies. What changed:    when to take this  reasons to take this   famotidine 40 MG tablet Commonly known as:  PEPCID Take 1 tablet (40 mg total) by mouth 2 (two) times daily.   fluticasone furoate-vilanterol 100-25 MCG/INH Aepb Commonly known as:  BREO ELLIPTA Inhale 1 puff into the lungs daily. Start taking on:  02/11/2018   ibuprofen 200 MG  tablet Commonly known as:  ADVIL,MOTRIN Take 400 mg by mouth every 6 (six) hours as needed for moderate pain.   ondansetron 4 MG disintegrating tablet Commonly known as:  ZOFRAN ODT Take 1 tablet (4 mg total) by mouth every 8 (eight) hours as needed for nausea or vomiting.   predniSONE 20 MG tablet Commonly known as:  DELTASONE Take 3 tablets (60 mg total) by mouth daily with breakfast.   sucralfate 1 GM/10ML suspension Commonly known as:  CARAFATE Take 10 mLs (1 g total) by mouth 4 (four) times daily -  with meals and at bedtime.      Follow-up Information    Mack Hook, MD. Schedule an appointment as soon as possible for a visit.   Specialty:  Internal Medicine Why:  can try this clinic of [facility] Contact information: [place] [address] [phone]            Time coordinating discharge: 25 min  Signed:  Geradine Girt  [facility] 02/10/2018, 2:53 PM

## 2018-02-10 NOTE — Progress Notes (Signed)
Pt discharged home in stable condition. Video interpreter utilized to provide teaching. Questions addressed as asked. AVS given before leaving unit

## 2018-02-10 NOTE — Progress Notes (Addendum)
Received pt alert and oriented. Pt ambulated to bed. Pt had been itching. Red rashes noted to bilateral arms and legs and some scratch marks to chest and back. Pt denies short of breath but complains of swelling in the throat and difficulty swallowing and having a hard time talking.  Pt does not speak [place], family at bedside interprets. Oriented pt to room and use of call light. Will monitor pt.

## 2018-02-11 ENCOUNTER — Encounter: Payer: Self-pay | Admitting: Internal Medicine

## 2018-02-11 ENCOUNTER — Ambulatory Visit: Payer: Self-pay | Admitting: Internal Medicine

## 2018-02-11 VITALS — BP 124/82 | HR 82 | Resp 12 | Ht 60.0 in | Wt 190.0 lb

## 2018-02-11 DIAGNOSIS — F329 Major depressive disorder, single episode, unspecified: Secondary | ICD-10-CM

## 2018-02-11 DIAGNOSIS — R21 Rash and other nonspecific skin eruption: Secondary | ICD-10-CM

## 2018-02-11 DIAGNOSIS — F419 Anxiety disorder, unspecified: Secondary | ICD-10-CM

## 2018-02-11 DIAGNOSIS — R221 Localized swelling, mass and lump, neck: Secondary | ICD-10-CM

## 2018-02-11 DIAGNOSIS — F32A Depression, unspecified: Secondary | ICD-10-CM

## 2018-02-11 DIAGNOSIS — J45909 Unspecified asthma, uncomplicated: Secondary | ICD-10-CM

## 2018-02-11 DIAGNOSIS — J3089 Other allergic rhinitis: Secondary | ICD-10-CM

## 2018-02-11 MED ORDER — ALBUTEROL SULFATE HFA 108 (90 BASE) MCG/ACT IN AERS: 2.0000 | INHALATION_SPRAY | Freq: Four times a day (QID) | RESPIRATORY_TRACT | 1 refills | Status: DC | PRN

## 2018-02-11 MED ORDER — FLUTICASONE FUROATE-VILANTEROL 100-25 MCG/INH IN AEPB: INHALATION_SPRAY | RESPIRATORY_TRACT | 11 refills | Status: DC

## 2018-02-11 MED ORDER — CETIRIZINE HCL 10 MG PO TABS: 10.0000 mg | ORAL_TABLET | Freq: Every day | ORAL | 11 refills | Status: DC

## 2018-02-11 MED ORDER — SERTRALINE HCL 25 MG PO TABS: 25.0000 mg | ORAL_TABLET | Freq: Every day | ORAL | 1 refills | Status: DC

## 2018-02-11 MED ORDER — ALBUTEROL SULFATE HFA 108 (90 BASE) MCG/ACT IN AERS
2.0000 | INHALATION_SPRAY | Freq: Four times a day (QID) | RESPIRATORY_TRACT | 1 refills | Status: DC | PRN
Start: 2018-02-11 — End: 2018-02-25

## 2018-02-11 MED ORDER — PREDNISONE 10 MG PO TABS: ORAL_TABLET | ORAL | 0 refills | Status: DC

## 2018-02-11 NOTE — Progress Notes (Signed)
Subjective:    Patient ID: Lorraine [name], female    DOB: 02-11-1986, 32 y.o.   MRN: [id number]  HPI   Here to establish Husband is a patient here. Interpretation via her minister, pastor at [place] through chart, having 4-5 visits to ED every month, sometimes on same day.  1.  Throat is tight and itching all over.  Started 5 days ago.  Had this maybe a month ago after one of her visits to [facility].  She states she felt this way after receiving MS in the ED there.  I see a visit on 01/21/2018 at [facility] where she was treated with Nitrofurantoin for possible UTI (mixed flora grew from clean catch urine specimen) and tramadol for pain.  She did not return to the ED for treatment of her throat and what she describes as hives all over.  States the symptoms resolved in 1 day. She has had hemorrhagic ovarian cyst on the right,  Pancreatitis with choledocholithiasis in the past. Multiple visits for abdominal pain.  5 days ago, began having what sounds like hives and throat tightness again.  Her husband cleaned the carpet with straight bleach 6 days ago.  The next day, she began having problems with her throat and rash.  She did also have problems with wheezing and chest tightness. She was hospitalized on the 24th with wheezing and hives.  She was not in anaphylaxis from discharge summary notes, though treated with epinephrine.  Apparently felt she developed a panic attack, worsening her breathing. She left the hospital and stayed at her uncle's home who also cleaned with clorox and had same problem, returning to ED the 26th for 2 more days, just returning to uncle's home again with the clorox smell yesterday. She was unable to get her prednisone or other meds filled and so has not continued medications.  Her inhalers were more than $300.  2.  Asthma:  Has had symptoms with breathing since 32 years of age.  Did not get diagnosed and treated until 2016.  Has  lived in [place]. Since 2007, but states she was not aware of a diagnosis until 3 years ago following birth of son. She is not aware of what are triggers of her asthma.  3.  Denies knowledge of allergies.  4.  Anxiety:  Never treated.  She does have Buspar listed as a medication, but states she has not been able to fill any of her medications due to cost. She feels she likely has depression as well.  She feels the symptoms of depression started most recently after birth of her son 3 years ago.  She was thinking of suicide 2 weeks ago, but did not have a plan.  She does not feel that way today.   She and her husband apparently have been getting counseling through her pastor.  He agrees she should get professional counseling and tells her so during our visit.  5. Prediabetes:  A1C was elevated in 2016 at 6.0%.  Her last two A1Cs this month put her sugars in the normal range.  Current Meds  Medication Sig  . albuterol (PROVENTIL HFA;VENTOLIN HFA) 108 (90 Base) MCG/ACT inhaler Inhale 2 puffs into the lungs every 6 (six) hours as needed for wheezing or shortness of breath.  . diphenhydrAMINE (BENADRYL) 25 MG tablet Take 1 tablet (25 mg total) by mouth every 6 (six) hours as needed for itching or allergies.  . famotidine (PEPCID) 40 MG tablet Take  1 tablet (40 mg total) by mouth 2 (two) times daily.  . fluticasone furoate-vilanterol (BREO ELLIPTA) 100-25 MCG/INH AEPB Inhale 1 puff into the lungs daily.  . predniSONE (DELTASONE) 20 MG tablet Take 3 tablets (60 mg total) by mouth daily with breakfast.  . sucralfate (CARAFATE) 1 GM/10ML suspension Take 10 mLs (1 g total) by mouth 4 (four) times daily -  with meals and at bedtime.    No Known Allergies   Past Medical History:  Diagnosis Date  . Asthma    Inhaler used 01/02/16  . Diverticulitis   . Nausea & vomiting 04/02/2017  . Pancreatitis   . Pre-diabetes   . Pyelonephritis   . Pyelonephritis     Past Surgical History:  Procedure Laterality  Date  . CESAREAN SECTION    . CESAREAN SECTION N/A 2006  . CESAREAN SECTION N/A 09/03/2014   Procedure: CESAREAN SECTION;  Surgeon: Guss Bunde, MD;  Location: [facility];  Service: Obstetrics;  Laterality: N/A;  . CHOLECYSTECTOMY    . ESOPHAGOGASTRODUODENOSCOPY (EGD) WITH PROPOFOL Left 04/07/2017   Procedure: ESOPHAGOGASTRODUODENOSCOPY (EGD) WITH PROPOFOL;  Surgeon: Ronnette Juniper, MD;  Location: [facility];  Service: Gastroenterology;  Laterality: Left;  . TUBAL LIGATION     Social History   Socioeconomic History  . Marital status: Married    Spouse name: Not on file  . Number of children: Not on file  . Years of education: Not on file  . Highest education level: Not on file  Occupational History  . Not on file  Social Needs  . Financial resource strain: Not on file  . Food insecurity:    Worry: Not on file    Inability: Not on file  . Transportation needs:    Medical: Not on file    Non-medical: Not on file  Tobacco Use  . Smoking status: Never Smoker  . Smokeless tobacco: Never Used  Substance and Sexual Activity  . Alcohol use: No  . Drug use: No  . Sexual activity: Yes    Birth control/protection: Surgical    Comment: tubal ligation that got untied by themselves in june 2018  Lifestyle  . Physical activity:    Days per week: Not on file    Minutes per session: Not on file  . Stress: Not on file  Relationships  . Social connections:    Talks on phone: Not on file    Gets together: Not on file    Attends religious service: Not on file    Active member of club or organization: Not on file    Attends meetings of clubs or organizations: Not on file    Relationship status: Not on file  . Intimate partner violence:    Fear of current or ex partner: Not on file    Emotionally abused: Not on file    Physically abused: Not on file    Forced sexual activity: Not on file  Other Topics Concern  . Not on file  Social History Narrative   ** Merged History Encounter **                 Review of Systems     Objective:   Physical Exam Constantly scratching at arms and chest, back of neck throughout history. Skin:  No rash noted currently, just redness where she scratches for brief period of time. Whispers her answers throughout the interview as well.  States due to swelling sensation in throat. Anxious appearing at times  HEENT:  PERRL, EOMI.  Conjunctivae with mild injection.  TMs pearly gray, nasal mucosa swollen and boggy bilaterally.  Posterior pharynx with cobbling. MMM Neck:  Supple No adenopathy. Chest:  CTA without upper airway noise, wheeze, or stridor.  Good air movement. CV:  RRR with normal S1 and S2, No S3, S4 or murmur.  Radial and DP pulses normal and equal. Abd:  Diffuse mild tenderness without rebound or peritoneal signs.  + BS.  No HSM or mass. LE: No edema.      Assessment & Plan:  1.  Recent hospitalizations for asthma exacerbation and hives.  Patient with recurrent symptoms as returning to environment of strong bleach smell irritant and without ability to continue medication. Prednisone burst and taper:  60 mg daily for 2 days, then 50 mg x1, 40 mg x2 days, then decrease by 5 mg daily until off.  #39 tabs Prednisone to accomplish this. MAP at [facility] for Albuterol rescue inhaler and Breo Ellipta, the latter for maintenance of her Asthma To control allergies as well:  Cetirizine 10 mg daily at [facility].  Consider addition of nasal corticosteroids as she weans from prednisone. Needs to pay attention to possible triggers Discussed with husband present that strong chemical odors can serve as irritant to her airways.  He will rent a carpet cleaner and use to lift some of the bleach from carpet. She can stay elsewhere while recovering from her current respiratory issues.  2.  Allergies:  Will need to address this more so in follow up.  Cetirizine in morning for now. Will discuss dust/dust mites, animals at next visit. May use  benadryl at bedtime if needed for itching, other allergic symptoms  3.  Major/postpartum Depression and significant anxiety/panic disorder associated:  Start Sertraline 25 mg daily.  Follow up for all of above in 1 week.  Will titrate up to 50 mg then if tolerating. Warm hand off to Macie Burows, LCSW

## 2018-02-11 NOTE — Progress Notes (Signed)
Patient ID: Lorraine [name], female   DOB: Feb 21, 1986, 33 y.o.   MRN: [id number]  LCSW completed warm hand-off with patient due to concerns regarding depression and anxiety. Pt did not disclose problems and appeared very anxious, but did agree to counseling session and scheduled for tomorrow.

## 2018-02-12 ENCOUNTER — Other Ambulatory Visit: Payer: Self-pay | Admitting: Licensed Clinical Social Worker

## 2018-02-14 ENCOUNTER — Ambulatory Visit (INDEPENDENT_AMBULATORY_CARE_PROVIDER_SITE_OTHER): Payer: Self-pay | Admitting: Allergy & Immunology

## 2018-02-14 ENCOUNTER — Encounter: Payer: Self-pay | Admitting: Allergy & Immunology

## 2018-02-14 VITALS — BP 110/76 | HR 88 | Temp 98.1°F | Resp 16 | Ht 60.0 in | Wt 182.5 lb

## 2018-02-14 DIAGNOSIS — J4540 Moderate persistent asthma, uncomplicated: Secondary | ICD-10-CM

## 2018-02-14 DIAGNOSIS — T782XXD Anaphylactic shock, unspecified, subsequent encounter: Secondary | ICD-10-CM

## 2018-02-14 MED ORDER — MONTELUKAST SODIUM 10 MG PO TABS: ORAL_TABLET | ORAL | 5 refills | Status: DC

## 2018-02-14 MED ORDER — RANITIDINE HCL 300 MG PO TABS: ORAL_TABLET | ORAL | 5 refills | Status: DC

## 2018-02-14 MED ORDER — EPINEPHRINE 0.3 MG/0.3ML IJ SOAJ: INTRAMUSCULAR | 1 refills | Status: DC

## 2018-02-14 MED ORDER — CETIRIZINE HCL 10 MG PO TABS: ORAL_TABLET | ORAL | 5 refills | Status: DC

## 2018-02-14 MED ORDER — FLUTICASONE FUROATE-VILANTEROL 100-25 MCG/INH IN AEPB: INHALATION_SPRAY | RESPIRATORY_TRACT | 5 refills | Status: DC

## 2018-02-14 NOTE — Progress Notes (Signed)
NEW PATIENT  Date of Service/Encounter:  02/14/18  Referring provider: Mack Hook, MD   Assessment:   Anaphylaxis - unknown trigger  Moderate persistent asthma - starting Breo   Complicated past medical history, including chronic abdominal pain, anxiety, and depression   Lorraine [name] is a 32 year old with a multitude of medical problems with 6 weeks of recent onset anaphylaxis-like episodes.  She does have abdominal pain with some of these episodes, but it should be noted that she has had multiple ER visits for abdominal pain even before the onset of the urticaria and anaphylaxis-like episodes.  Her history includes no clear trigger for the symptoms, so we will rule out some serious causes of anaphylaxis.  We will look into alpha gal sensitivity with an alpha gal panel.  We will rule out mast cell dysfunction with a serum tryptase.  We will get an environmental allergy panel as well as an extended mold panel to rule out environmental allergen triggers.  With a history of abdominal pain, pheochromocytoma and carcinoid syndrome are in the differential, albeit much lower.  We will look into these with urine studies.  I will get a stinging insect panel as well to rule out the remote possibility of this is a trigger of her symptoms.  Medications do not seem to play a role, as she was on very few medications when these episodes started.  She has had no recent travel to suggest an infectious etiology, although she does endorse intermittent fevers.  The pattern is not entirely clear, however.  We will provide epinephrine training and give her an EpiPen prescription.  It is interesting that the epinephrine relieves the symptoms nearly immediately, including the hives.  This could certainly be a combination of chronic urticaria in the setting of anxiety, therefore Xolair will be considered in the future if the symptoms continue to be a problem.  In the interim, we will treat with histamine  suppression and a leukotriene modifier.  She does have a history of asthma, which her new primary care provider Dr. Amil Amen is managing.   Plan/Recommendations:    1. Anaphylaxis - unknown trigger - We are still unsure what is causing your reactions.  - We will get some labs to rule out serious causes of anaphylaxis: tryptase level, ESR, CRP, alpha gal panel. - We will rule out hereditary angioedema with C1 esterase inhibitor level and function. - We will rule out pheochromocytoma and carcinoid syndrome with some urine studies.  - We will call you in 1-2 weeks with the results of the testing.  - Chronic hives/swelling are often times a self limited process and will "burn themselves out" over 6-12 months, although this is not always the case.  - EpiPen training and prescription provided today. - Anaphylaxis management plan provided.  - In the meantime, start suppressive dosing of antihistamines:   - Morning: Zyrtec (cetirizine) 25m (one tablet)  - Evening: Zyrtec (cetirizine) 167m(one tablet) + Zantac (ranitidine) 30041m Singulair (montelukast) 8m8m If you are not tolerating the medications or are tired of taking them every day, we can start treatment with a monthly injectable medication called Xolair.   2. Return in about 1 month (around 03/14/2018).  Total of 120 minutes, greater than 50% of which was spent in discussion of treatment and management options.       Subjective:   Lorraine [name] 31 y44. female presenting today for evaluation of  Chief Complaint  Patient presents with  .  Angioedema  . Asthma  . Rash    Lorraine [name] has a history of the following: Patient Active Problem List   Diagnosis Date Noted  . Rash 02/09/2018  . Chest pain 02/08/2018  . Acute pancreatitis 04/02/2017  . Abdominal pain, acute, left upper quadrant 04/02/2017  . Intractable nausea and vomiting 04/02/2017  . Asthma in adult 04/02/2017  . Obesity (BMI 30.0-34.9)  04/02/2017  . Fatty infiltration of liver 04/02/2017  . Gastroesophageal reflux disease without esophagitis   . Pre-diabetes 07/15/2015  . Diarrhea   . Hypokalemia   . Bacterial vaginosis   . Pyelonephritis 07/07/2015  . Abdominal pain, right upper quadrant   . Asthma 10/31/2014  . History of preterm delivery 10/22/2014  . Status post repeat low transverse cesarean section 09/06/2014  . Diverticulitis 09/10/2012    History obtained from: chart review and patient via an interpreter.  Lorraine [name] was referred by Mack Hook, MD.     Lorraine [name] is a 32 y.o. female presenting for an evaluation of anaphylaxis episodes. Symptoms started when she woke up and she was very itchy. She thought that it was just itching. By mid day it felt that her throat was tightening up.   Patient was seen in the ED on 524 and admitted for observation.  She initially presented with itching and hives that started on the morning of May 24.  Hives were present initially on her upper extremities.  She also developed some throat pain versus swelling.In the ED on May 24th, she did have tachycardia into the 130s.  Her pulse oximetry was normal.  She was given prednisone, famotidine, epinephrine x2, and Benadryl x2.  Blood work including a CBC was unremarkable.  She did have a mildly elevated potassium of 6.5.  She was admitted to the hospital and monitored overnight.  Evidently, she was admitted due to chest pain which was felt to be related to anxiety.  An EKG was unremarkable.  She then presented to the ED on the day of discharge (May 25th)with throat closure, lip and tongue tingling, and shortness of breath.  She was immediately seen in triage and given a dose of epinephrine.  The epinephrine to relieve the symptoms immediately.  Blood work was notable for a left shift on her complete blood count.  She received epinephrine, famotidine, Benadryl, methylprednisolone, DuoNeb, and Ativan prior to discharge.  She was  discharged on May 26 and then presented again to the ED with similar symptoms that evening.  She was admitted overnight once again for observation. The epinephrine clears it up immediately. Currently she is taking only steroids (prednisone tapering from 82m daily).   She denies new soaps, creams, or diet changes. She does tolerate wheat (pasta, bread), peanut butter, eggs, and cow's milk (cheese mostly). She does eat seafood including tilapia and shrimp. There was no common trigger that she ate prior to the onset of the reactions. There was mango on one occasion and banana on another. She does eat a lot of beans, rice, and tortillas. She does have pictures that clearly demonstrate urticaria. There is no residual skin markings present.   There were no recent medication changes. She has never taken cetirizine or another antihistamine on a daily basis. She has no history of seasonal allergies. There might have been some mold exposure in her home. [facility] It took them one month to get the [employer]. It was repaired. It did become humid in the home. She currently  lives in a house with a basement, which is not finished.   She did have an episode 6 weeks ago prior to the [facility] stopping working. This episode occurred with a left sided abdominal pain. She did get morphine that once but did not get it on a daily basis. This is always on the left side. It is sometimes associated with the hives but not always. She does not have diarrhea with this typically but she can have this.  She does have some episode of flushing with this. She does get fevers up to 102 intermittently. They last around a few days. She does not remember getting stung by anything.   Of note, she has had 11 ED visits since December 2018 for abdominal pain. She has had a multitude of workups including two pelvis and abdominal CTs and transvaginal and transabdominal ultrasounds which have been normal. Blood work has all been  normal as well.   She does have a history of asthma and uses albuterol only rarely as needed. She does go to the ED for breathing problems occasionally. She has Breo listed as a medication, but this has not been started. She did start Zoloft three days ago. There has been no recent travel.   She does have a PCP (Dr. Mack Hook). Prior to this, she did not have a PCP.  Otherwise, there is no history of other atopic diseases, including drug allergies, stinging insect allergies, or urticaria. There is no significant infectious history. Vaccinations are up to date.    Past Medical History: Patient Active Problem List   Diagnosis Date Noted  . Rash 02/09/2018  . Chest pain 02/08/2018  . Acute pancreatitis 04/02/2017  . Abdominal pain, acute, left upper quadrant 04/02/2017  . Intractable nausea and vomiting 04/02/2017  . Asthma in adult 04/02/2017  . Obesity (BMI 30.0-34.9) 04/02/2017  . Fatty infiltration of liver 04/02/2017  . Gastroesophageal reflux disease without esophagitis   . Pre-diabetes 07/15/2015  . Diarrhea   . Hypokalemia   . Bacterial vaginosis   . Pyelonephritis 07/07/2015  . Abdominal pain, right upper quadrant   . Asthma 10/31/2014  . History of preterm delivery 10/22/2014  . Status post repeat low transverse cesarean section 09/06/2014  . Diverticulitis 09/10/2012    Medication List:  Allergies as of 02/14/2018      Reactions   Morphine Rash      Medication List        Accurate as of 02/14/18  9:50 AM. Always use your most recent med list.          albuterol 108 (90 Base) MCG/ACT inhaler Commonly known as:  PROVENTIL HFA;VENTOLIN HFA Inhale 2 puffs into the lungs every 6 (six) hours as needed for wheezing or shortness of breath.   cetirizine 10 MG tablet Commonly known as:  ZYRTEC Take 1 tablet (10 mg total) by mouth daily.   diphenhydrAMINE 25 MG tablet Commonly known as:  BENADRYL Take 1 tablet (25 mg total) by mouth every 6 (six) hours as  needed for itching or allergies.   famotidine 40 MG tablet Commonly known as:  PEPCID Take 1 tablet (40 mg total) by mouth 2 (two) times daily.   fluticasone furoate-vilanterol 100-25 MCG/INH Aepb Commonly known as:  BREO ELLIPTA Inhale 1 puff daily and brush teeth and tongue after use.   predniSONE 10 MG tablet Commonly known as:  DELTASONE Take 6 tabs daily for 2 days then begin taper as written in your handout   sertraline  25 MG tablet Commonly known as:  ZOLOFT Take 1 tablet (25 mg total) by mouth daily.   sucralfate 1 GM/10ML suspension Commonly known as:  CARAFATE Take 10 mLs (1 g total) by mouth 4 (four) times daily -  with meals and at bedtime.   triamcinolone cream 0.1 % Commonly known as:  KENALOG Apply topically.       Birth History: non-contributory.    Developmental History: non-contributory.   Past Surgical History: Past Surgical History:  Procedure Laterality Date  . CESAREAN SECTION    . CESAREAN SECTION N/A 2006  . CESAREAN SECTION N/A 09/03/2014   Procedure: CESAREAN SECTION;  Surgeon: Guss Bunde, MD;  Location: [facility];  Service: Obstetrics;  Laterality: N/A;  . CHOLECYSTECTOMY    . ESOPHAGOGASTRODUODENOSCOPY (EGD) WITH PROPOFOL Left 04/07/2017   Procedure: ESOPHAGOGASTRODUODENOSCOPY (EGD) WITH PROPOFOL;  Surgeon: Ronnette Juniper, MD;  Location: [facility];  Service: Gastroenterology;  Laterality: Left;  . TUBAL LIGATION       Family History: Family History  Problem Relation Age of Onset  . Hyperlipidemia Mother   . Diabetes Father   . Diabetes Maternal Uncle   . Diabetes Paternal Grandmother   . Asthma Daughter   . Allergic rhinitis Neg Hx   . Angioedema Neg Hx   . Eczema Neg Hx   . Immunodeficiency Neg Hx   . Urticaria Neg Hx      Social History: Lorraine lives at home with her husband and two children. They live in a house with an unfinished basement. There are no animals inside or outside of the home. 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 She has been in the [place] since 2007.      Review of Systems: a 14-point review of systems is pertinent for what is mentioned in HPI.  Otherwise, all other systems were negative. Constitutional: negative other than that listed in the HPI Eyes: negative other than that listed in the HPI Ears, nose, mouth, throat, and face: negative other than that listed in the HPI Respiratory: negative other than that listed in the HPI Cardiovascular: negative other than that listed in the HPI Gastrointestinal: negative other than that listed in the HPI Genitourinary: negative other than that listed in the HPI Integument: negative other than that listed in the HPI Hematologic: negative other than that listed in the HPI Musculoskeletal: negative other than that listed in the HPI Neurological: negative other than that listed in the HPI Allergy/Immunologic: negative other than that listed in the HPI    Objective:   Blood pressure 110/76, pulse 88, temperature 98.1 F (36.7 C), temperature source Oral, resp. rate 16, height 5' (1.524 m), weight 182 lb 8.7 oz (82.8 kg), last menstrual period 01/20/2018, SpO2 97 %. Body mass index is 35.65 kg/m.   Physical Exam:  General: Alert, interactive, in no acute distress. Obese female. Little English.  Eyes: No conjunctival injection bilaterally, no discharge on the right, no discharge on the left and no Horner-Trantas dots present. PERRL bilaterally. EOMI without pain. No photophobia.  Ears: Right TM pearly gray with normal light reflex, Left TM pearly gray with normal light reflex, Right TM intact without perforation and Left TM intact without perforation.  Nose/Throat: External nose within normal limits and septum midline. Turbinates edematous without discharge. Posterior oropharynx mildly erythematous without cobblestoning in the posterior oropharynx. Tonsils 2+ without exudates.  Tongue without thrush. Neck: Supple without  thyromegaly. Trachea midline. Adenopathy: no enlarged lymph nodes appreciated in the  anterior cervical, occipital, axillary, epitrochlear, inguinal, or popliteal regions. Lungs: Clear to auscultation without wheezing, rhonchi or rales. No increased work of breathing. CV: Normal S1/S2. No murmurs. Capillary refill <2 seconds.  Abdomen: Nondistended. Tenderness in the LUQ and LLQ. No guarding or rebound tenderness. Bowel sounds present in all fields and hypoactive  Skin: Warm and dry, without lesions or rashes. Extremities:  No clubbing, cyanosis or edema. Neuro:   Grossly intact. No focal deficits appreciated. Responsive to questions.  Diagnostic studies:   Spirometry: results normal (FEV1: 2.65/95%, FVC: 2.80/85%, FEV1/FVC: 95%).    Spirometry consistent with normal pattern.   Allergy Studies: deferred due to recent antihistamine use      Salvatore Marvel, MD [facility]

## 2018-02-14 NOTE — Patient Instructions (Addendum)
1. Anaphylaxis - unknown trigger - We are still unsure what is causing your reactions.  - We will get some labs to rule out serious causes of anaphylaxis: tryptase level, ESR, CRP, alpha gal panel. - We will rule out hereditary angioedema with C1 esterase inhibitor level and function. - We will rule out pheochromocytoma and carcinoid syndrome with some urine studies.  - We will call you in 1-2 weeks with the results of the testing.  - Chronic hives/swelling are often times a self limited process and will "burn themselves out" over 6-12 months, although this is not always the case.  - EpiPen training and prescription provided today. - Anaphylaxis management plan provided.  - In the meantime, start suppressive dosing of antihistamines:   - Morning: Zyrtec (cetirizine) 54m (one tablet)  - Evening: Zyrtec (cetirizine) 13m(one tablet) + Zantac (ranitidine) 3002m Singulair (montelukast) 71m40m If you are not tolerating the medications or are tired of taking them every day, we can start treatment with a monthly injectable medication called Xolair.   2. Return in about 1 month (around 03/14/2018).   Please inform us o[place]any Emergency Department visits, hospitalizations, or changes in symptoms. Call us b[place]oreaore going to the ED for breathing or allergy symptoms since we might be able to fit you in for a sick visit. Feel free to contact us a[place]oreatime with any questions, problems, or concerns.  It was a pleasure to meet you today!  Websites that have reliable patient information: 1. American Academy of Asthma, Allergy, and Immunology: www.aaaai.org 2. Food Allergy Research and Education (FARE): foodallergy.org 3. Mothers of Asthmatics: http://www.asthmacommunitynetwork.org 4. American College of Allergy, Asthma, and Immunology: www.MonthlyElectricBill.co.ukake sure you are registered to vote!

## 2018-02-16 ENCOUNTER — Encounter: Payer: Self-pay | Admitting: Allergy & Immunology

## 2018-02-17 ENCOUNTER — Other Ambulatory Visit: Payer: Self-pay

## 2018-02-17 ENCOUNTER — Encounter (HOSPITAL_COMMUNITY): Payer: Self-pay | Admitting: Emergency Medicine

## 2018-02-17 ENCOUNTER — Ambulatory Visit: Payer: Self-pay | Admitting: Allergy & Immunology

## 2018-02-17 ENCOUNTER — Emergency Department (HOSPITAL_COMMUNITY): Payer: Self-pay

## 2018-02-17 ENCOUNTER — Emergency Department (HOSPITAL_COMMUNITY)
Admission: EM | Admit: 2018-02-17 | Discharge: 2018-02-17 | Disposition: A | Discharge status: Home / Self Care | Payer: Self-pay | Attending: Emergency Medicine | Admitting: Emergency Medicine

## 2018-02-17 DIAGNOSIS — R531 Weakness: Secondary | ICD-10-CM | POA: Insufficient documentation

## 2018-02-17 DIAGNOSIS — R200 Anesthesia of skin: Secondary | ICD-10-CM | POA: Insufficient documentation

## 2018-02-17 DIAGNOSIS — J45909 Unspecified asthma, uncomplicated: Secondary | ICD-10-CM | POA: Insufficient documentation

## 2018-02-17 LAB — I-STAT CHEM 8, ED
BUN: 16 mg/dL (ref 6–20)
CREATININE: 0.6 mg/dL (ref 0.44–1.00)
Calcium, Ion: 1.14 mmol/L — ABNORMAL LOW (ref 1.15–1.40)
Chloride: 104 mmol/L (ref 101–111)
Glucose, Bld: 104 mg/dL — ABNORMAL HIGH (ref 65–99)
HEMATOCRIT: 39 % (ref 36.0–46.0)
Hemoglobin: 13.3 g/dL (ref 12.0–15.0)
POTASSIUM: 3.7 mmol/L (ref 3.5–5.1)
Sodium: 138 mmol/L (ref 135–145)
TCO2: 22 mmol/L (ref 22–32)

## 2018-02-17 LAB — COMPREHENSIVE METABOLIC PANEL
ALT: 21 U/L (ref 14–54)
AST: 18 U/L (ref 15–41)
Albumin: 3.6 g/dL (ref 3.5–5.0)
Alkaline Phosphatase: 82 U/L (ref 38–126)
Anion gap: 8 (ref 5–15)
BUN: 16 mg/dL (ref 6–20)
CALCIUM: 8.8 mg/dL — AB (ref 8.9–10.3)
CHLORIDE: 105 mmol/L (ref 101–111)
CO2: 24 mmol/L (ref 22–32)
CREATININE: 0.76 mg/dL (ref 0.44–1.00)
GFR calc non Af Amer: >60 mL/min (ref >60)
Glucose, Bld: 105 mg/dL — ABNORMAL HIGH (ref 65–99)
Potassium: 3.7 mmol/L (ref 3.5–5.1)
Sodium: 137 mmol/L (ref 135–145)
Total Bilirubin: 0.6 mg/dL (ref 0.3–1.2)
Total Protein: 6.5 g/dL (ref 6.5–8.1)

## 2018-02-17 LAB — DIFFERENTIAL
Abs Immature Granulocytes: 0.1 10*3/uL (ref 0.0–0.1)
BASOS PCT: 0 %
Basophils Absolute: 0 10*3/uL (ref 0.0–0.1)
Eosinophils Absolute: 0.1 10*3/uL (ref 0.0–0.7)
Eosinophils Relative: 1 %
Immature Granulocytes: 1 %
Lymphocytes Relative: 45 %
Lymphs Abs: 4.6 10*3/uL — ABNORMAL HIGH (ref 0.7–4.0)
MONO ABS: 0.6 10*3/uL (ref 0.1–1.0)
MONOS PCT: 6 %
NEUTROS ABS: 4.8 10*3/uL (ref 1.7–7.7)
NEUTROS PCT: 47 %

## 2018-02-17 LAB — CBG MONITORING, ED: Glucose-Capillary: 104 mg/dL — ABNORMAL HIGH (ref 65–99)

## 2018-02-17 LAB — I-STAT BETA HCG BLOOD, ED (MC, WL, AP ONLY): I-stat hCG, quantitative: <5 m[IU]/mL (ref <5)

## 2018-02-17 LAB — PROTIME-INR
INR: 0.93
PROTHROMBIN TIME: 12.4 s (ref 11.4–15.2)

## 2018-02-17 LAB — APTT: aPTT: 29 seconds (ref 24–36)

## 2018-02-17 LAB — CBC
HCT: 39.6 % (ref 36.0–46.0)
HEMOGLOBIN: 12.8 g/dL (ref 12.0–15.0)
MCH: 29.5 pg (ref 26.0–34.0)
MCHC: 32.3 g/dL (ref 30.0–36.0)
MCV: 91.2 fL (ref 78.0–100.0)
PLATELETS: 298 10*3/uL (ref 150–400)
RBC: 4.34 MIL/uL (ref 3.87–5.11)
RDW: 12.9 % (ref 11.5–15.5)
WBC: 10.1 10*3/uL (ref 4.0–10.5)

## 2018-02-17 LAB — I-STAT TROPONIN, ED: Troponin i, poc: 0.01 ng/mL (ref 0.00–0.08)

## 2018-02-17 NOTE — ED Notes (Signed)
Patient transported to CT 

## 2018-02-17 NOTE — ED Provider Notes (Signed)
[facility] EMERGENCY DEPARTMENT Provider Note   CSN: [id number] Arrival date & time: 02/17/18  0802     History   Chief Complaint Chief Complaint  Patient presents with  . Numbness    HPI Lorraine [name] is a 32 y.o. female.  HPI  Patient presents with concern of numbness and weakness on the left side. Since about 10 hours ago she has had persistent symptoms. No clear precipitant. She notes that she had been feeling generally tired for several days prior to the onset of illness. Now since unless the symptoms have been persistent. No confusion, no syncope, no pain that is new, though she does have ongoing back pain. She denies recent medication change, diet change beyond recent evaluation for allergic reaction, with brief course of allergy medication.   Past Medical History:  Diagnosis Date  . Asthma    Inhaler used 01/02/16  . Diverticulitis   . Nausea & vomiting 04/02/2017  . Pancreatitis   . Pre-diabetes   . Pyelonephritis   . Pyelonephritis     Patient Active Problem List   Diagnosis Date Noted  . Rash 02/09/2018  . Chest pain 02/08/2018  . Acute pancreatitis 04/02/2017  . Abdominal pain, acute, left upper quadrant 04/02/2017  . Intractable nausea and vomiting 04/02/2017  . Asthma in adult 04/02/2017  . Obesity (BMI 30.0-34.9) 04/02/2017  . Fatty infiltration of liver 04/02/2017  . Gastroesophageal reflux disease without esophagitis   . Pre-diabetes 07/15/2015  . Diarrhea   . Hypokalemia   . Bacterial vaginosis   . Pyelonephritis 07/07/2015  . Abdominal pain, right upper quadrant   . Asthma 10/31/2014  . History of preterm delivery 10/22/2014  . Status post repeat low transverse cesarean section 09/06/2014  . Diverticulitis 09/10/2012    Past Surgical History:  Procedure Laterality Date  . CESAREAN SECTION    . CESAREAN SECTION N/A 2006  . CESAREAN SECTION N/A 09/03/2014   Procedure: CESAREAN SECTION;  Surgeon: Guss Bunde, MD;  Location: [facility];  Service: Obstetrics;  Laterality: N/A;  . CHOLECYSTECTOMY    . ESOPHAGOGASTRODUODENOSCOPY (EGD) WITH PROPOFOL Left 04/07/2017   Procedure: ESOPHAGOGASTRODUODENOSCOPY (EGD) WITH PROPOFOL;  Surgeon: Ronnette Juniper, MD;  Location: [facility];  Service: Gastroenterology;  Laterality: Left;  . TUBAL LIGATION       OB History    Gravida  5   Para  3   Term  1   Preterm  2   AB  2   Living  3     SAB  2   TAB      Ectopic      Multiple  1   Live Births  3            Home Medications    Prior to Admission medications   Medication Sig Start Date End Date Taking? Authorizing Provider  albuterol (PROVENTIL HFA;VENTOLIN HFA) 108 (90 Base) MCG/ACT inhaler Inhale 2 puffs into the lungs every 6 (six) hours as needed for wheezing or shortness of breath. 02/11/18  Yes Mack Hook, MD  cetirizine (ZYRTEC) 10 MG tablet Take 1 tablet twice daily for itching. 02/14/18  Yes Valentina Shaggy, MD  diphenhydrAMINE (BENADRYL) 25 MG tablet Take 1 tablet (25 mg total) by mouth every 6 (six) hours as needed for itching or allergies. 02/10/18  Yes Geradine Girt, DO  EPINEPHrine 0.3 mg/0.3 mL IJ SOAJ injection Use as directed for severe allergic reaction 02/14/18  Yes Valentina Shaggy, MD  fluticasone furoate-vilanterol (BREO ELLIPTA) 100-25 MCG/INH AEPB Inhale 1 puff daily and brush teeth and tongue after use. 02/11/18  Yes Mack Hook, MD  montelukast (SINGULAIR) 10 MG tablet Take one tablet once at bedtime 02/14/18  Yes Valentina Shaggy, MD  ranitidine (ZANTAC) 300 MG tablet Take one tablet once daily 02/14/18  Yes Valentina Shaggy, MD  sertraline (ZOLOFT) 25 MG tablet Take 1 tablet (25 mg total) by mouth daily. 02/11/18  Yes Mack Hook, MD  triamcinolone cream (KENALOG) 0.1 % Apply topically. 02/12/18 02/12/19 Yes [provider]  famotidine (PEPCID) 40 MG tablet Take 1 tablet (40 mg total) by mouth 2 (two) times  daily. 02/08/18   Maczis, Barth Kirks, PA-C  fluticasone furoate-vilanterol (BREO ELLIPTA) 100-25 MCG/INH AEPB 1 puff once daily to prevent coughing or wheezing. Patient not taking: Reported on 02/17/2018 02/14/18   Valentina Shaggy, MD  sucralfate (CARAFATE) 1 GM/10ML suspension Take 10 mLs (1 g total) by mouth 4 (four) times daily -  with meals and at bedtime. Patient not taking: Reported on 02/14/2018 02/02/18   Street, [place], PA-C    Family History Family History  Problem Relation Age of Onset  . Hyperlipidemia Mother   . Diabetes Father   . Diabetes Maternal Uncle   . Diabetes Paternal Grandmother   . Asthma Daughter   . Allergic rhinitis Neg Hx   . Angioedema Neg Hx   . Eczema Neg Hx   . Immunodeficiency Neg Hx   . Urticaria Neg Hx     Social History Social History   Tobacco Use  . Smoking status: Never Smoker  . Smokeless tobacco: Never Used  Substance Use Topics  . Alcohol use: No  . Drug use: No     Allergies   Morphine   Review of Systems Review of Systems  Constitutional:       Per HPI, otherwise negative  HENT:       Per HPI, otherwise negative  Respiratory:       Per HPI, otherwise negative  Cardiovascular:       Per HPI, otherwise negative  Gastrointestinal: Negative for vomiting.  Endocrine:       Negative aside from HPI  Genitourinary:       Neg aside from HPI   Musculoskeletal:       Per HPI, otherwise negative  Skin: Negative.   Neurological: Positive for weakness. Negative for syncope.     Physical Exam Updated Vital Signs BP 118/80 (BP Location: Right Arm)   Pulse 72   Temp 98.5 F (36.9 C) (Oral)   Resp 16   LMP 01/20/2018   SpO2 100%   Physical Exam  Constitutional: She is oriented to person, place, and time. She appears well-developed and well-nourished. No distress.  HENT:  Head: Normocephalic and atraumatic.  Eyes: Conjunctivae and EOM are normal.  Cardiovascular: Normal rate and regular rhythm.  Pulmonary/Chest:  Effort normal and breath sounds normal. No stridor. No respiratory distress.  Abdominal: She exhibits no distension.  Musculoskeletal: She exhibits no edema.  Neurological: She is alert and oriented to person, place, and time. She displays no tremor. No cranial nerve deficit. She displays no seizure activity.  Patient is hesitant to move the left side, though when the left arm is positioned above her head, and she has asked to hold it against gravity, she moves it away from striking her face, please set it at her left side. Right strength 5/5 upper and lower extremity no facial asymmetry,  no speech deficit.  Skin: Skin is warm and dry.  Psychiatric: She has a normal mood and affect.  Nursing note and vitals reviewed.    ED Treatments / Results  Labs (all labs ordered are listed, but only abnormal results are displayed) Labs Reviewed  DIFFERENTIAL - Abnormal; Notable for the following components:      Result Value   Lymphs Abs 4.6 (*)    All other components within normal limits  COMPREHENSIVE METABOLIC PANEL - Abnormal; Notable for the following components:   Glucose, Bld 105 (*)    Calcium 8.8 (*)    All other components within normal limits  CBG MONITORING, ED - Abnormal; Notable for the following components:   Glucose-Capillary 104 (*)    All other components within normal limits  I-STAT CHEM 8, ED - Abnormal; Notable for the following components:   Glucose, Bld 104 (*)    Calcium, Ion 1.14 (*)    All other components within normal limits  PROTIME-INR  APTT  CBC  I-STAT TROPONIN, ED  I-STAT BETA HCG BLOOD, ED ([facility], [facility], [facility] ONLY)    EKG EKG Interpretation  Date/Time:  Monday February 17 2018 08:14:10 EDT Ventricular Rate:  81 PR Interval:  138 QRS Duration: 82 QT Interval:  382 QTC Calculation: 443 R Axis:   49 Text Interpretation:  Normal sinus rhythm Nonspecific T wave abnormality Abnormal ekg Confirmed by Carmin Muskrat [phone]) on 02/17/2018 9:39:20 AM   Radiology Ct  Head Wo Contrast  Result Date: 02/17/2018 CLINICAL DATA:  Left-sided heaviness and weakness for 2 days EXAM: CT HEAD WITHOUT CONTRAST TECHNIQUE: Contiguous axial images were obtained from the base of the skull through the vertex without intravenous contrast. COMPARISON:  August 01, 2017 FINDINGS: Brain: The ventricles are normal in size and configuration. 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 There is no intracranial mass, hemorrhage, extra-axial fluid collection, or midline shift. Gray-white compartments appear normal. No evident acute infarct. Vascular: There is no hyperdense vessel. There is no evident vascular calcification. Skull: Bony calvarium appears intact. Sinuses/Orbits: There is mucosal thickening in the left maxillary antrum. There is mucosal thickening in several ethmoid air cells. Other visualized paranasal sinuses are clear. Orbits appear symmetric bilaterally. Other: Mastoid air cells are clear. 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 No intracranial mass or hemorrhage. Gray-white compartments appear normal. No evident acute infarct. There are foci of paranasal sinus disease. Electronically Signed   By: Lowella Grip III M.D.   On: 02/17/2018 08:58    Procedures Procedures (including critical care time)  Medications Ordered in ED Medications - No data to display   Initial Impression / Assessment and Plan / ED Course  I have reviewed the triage vital signs and the nursing notes.  Pertinent labs & imaging results that were available during my care of the patient were reviewed by me and considered in my medical decision making (see chart for details).   After the initial evaluation I reviewed the patient's chart, notable for 13 prior ED visits in the past 6 months, and evaluation for allergic reaction within the past days.   10:35 AM On repeat exam the patient is awake and alert, in no  distress. Findings are reassuring, CT with no mass, hemorrhage, and given her after mentioned physical exam, there is low suspicion for stroke. I discussed all findings with the patient and her husband at length Patient notes that she follows up in  the clinic, encouraged her to follow-up there. Patient's presentation unclear etiology, but given the seemingly volitional control of her extremity weakness, there is low suspicion for stroke, no evidence for infection. Patient has recent allergic reaction, no evidence for recurrence. Patient discharged in stable condition.  Final Clinical Impressions(s) / ED Diagnoses  Weakness   Carmin Muskrat, MD 02/17/18 1036

## 2018-02-17 NOTE — ED Notes (Signed)
ED Provider at bedside. 

## 2018-02-17 NOTE — ED Triage Notes (Signed)
Patient complains of left sided numbness that started at 2100 last night. Left arm significantly weaker than right, no facial droop, no slurred speech, patient complains of diminished vision in left eye, patient states she has no sensation on left face and arm. Alert and oriented and in no apparent distress at this time.

## 2018-02-17 NOTE — Discharge Instructions (Signed)
As discussed, your evaluation today has been largely reassuring.  But, it is important that you monitor your condition carefully, and do not hesitate to return to the ED if you develop new, or concerning changes in your condition. ? ?Otherwise, please follow-up with your physician for appropriate ongoing care. ? ?

## 2018-02-17 NOTE — ED Notes (Signed)
Pt a

## 2018-02-18 ENCOUNTER — Ambulatory Visit (HOSPITAL_COMMUNITY)
Admission: EM | Admit: 2018-02-18 | Discharge: 2018-02-18 | Disposition: A | Discharge status: Home / Self Care | Payer: Self-pay | Attending: Emergency Medicine | Admitting: Emergency Medicine

## 2018-02-18 ENCOUNTER — Encounter (HOSPITAL_COMMUNITY): Payer: Self-pay | Admitting: Emergency Medicine

## 2018-02-18 ENCOUNTER — Encounter (HOSPITAL_COMMUNITY): Payer: Self-pay | Admitting: *Deleted

## 2018-02-18 ENCOUNTER — Emergency Department (HOSPITAL_COMMUNITY): Payer: Self-pay

## 2018-02-18 ENCOUNTER — Telehealth: Payer: Self-pay | Admitting: *Deleted

## 2018-02-18 ENCOUNTER — Inpatient Hospital Stay (HOSPITAL_COMMUNITY)
Admission: EM | Admit: 2018-02-18 | Discharge: 2018-02-25 | DRG: 880 | Disposition: A | Discharge status: Home Health | Payer: Self-pay | Attending: Family Medicine | Admitting: Family Medicine

## 2018-02-18 DIAGNOSIS — R200 Anesthesia of skin: Secondary | ICD-10-CM

## 2018-02-18 DIAGNOSIS — Z825 Family history of asthma and other chronic lower respiratory diseases: Secondary | ICD-10-CM

## 2018-02-18 DIAGNOSIS — R531 Weakness: Secondary | ICD-10-CM

## 2018-02-18 DIAGNOSIS — M5412 Radiculopathy, cervical region: Secondary | ICD-10-CM

## 2018-02-18 DIAGNOSIS — R402252 Coma scale, best verbal response, oriented, at arrival to emergency department: Secondary | ICD-10-CM | POA: Diagnosis present

## 2018-02-18 DIAGNOSIS — R29898 Other symptoms and signs involving the musculoskeletal system: Secondary | ICD-10-CM

## 2018-02-18 DIAGNOSIS — F419 Anxiety disorder, unspecified: Secondary | ICD-10-CM | POA: Diagnosis present

## 2018-02-18 DIAGNOSIS — F449 Dissociative and conversion disorder, unspecified: Principal | ICD-10-CM | POA: Diagnosis present

## 2018-02-18 DIAGNOSIS — R402142 Coma scale, eyes open, spontaneous, at arrival to emergency department: Secondary | ICD-10-CM | POA: Diagnosis present

## 2018-02-18 DIAGNOSIS — Z833 Family history of diabetes mellitus: Secondary | ICD-10-CM

## 2018-02-18 DIAGNOSIS — R402362 Coma scale, best motor response, obeys commands, at arrival to emergency department: Secondary | ICD-10-CM | POA: Diagnosis present

## 2018-02-18 DIAGNOSIS — K219 Gastro-esophageal reflux disease without esophagitis: Secondary | ICD-10-CM | POA: Diagnosis present

## 2018-02-18 DIAGNOSIS — G8194 Hemiplegia, unspecified affecting left nondominant side: Secondary | ICD-10-CM | POA: Diagnosis present

## 2018-02-18 DIAGNOSIS — R7303 Prediabetes: Secondary | ICD-10-CM | POA: Diagnosis present

## 2018-02-18 DIAGNOSIS — F329 Major depressive disorder, single episode, unspecified: Secondary | ICD-10-CM | POA: Diagnosis present

## 2018-02-18 DIAGNOSIS — J45909 Unspecified asthma, uncomplicated: Secondary | ICD-10-CM | POA: Diagnosis present

## 2018-02-18 MED ORDER — GADOBENATE DIMEGLUMINE 529 MG/ML IV SOLN
17.0000 mL | Freq: Once | INTRAVENOUS | Status: AC | PRN
  Administered 2018-02-18: 17 mL via INTRAVENOUS

## 2018-02-18 MED ORDER — KETOROLAC TROMETHAMINE 15 MG/ML IJ SOLN
15.0000 mg | Freq: Once | INTRAMUSCULAR | Status: AC
  Administered 2018-02-18: 15 mg via INTRAVENOUS
  Filled 2018-02-18: qty 1

## 2018-02-18 NOTE — Discharge Instructions (Addendum)
We were unable to reach on call neurology. Given continued left sided numbness without any sensation of the foot/face, please go to the emergency department for further evaluation needed.

## 2018-02-18 NOTE — Telephone Encounter (Signed)
This has been addressed by writer this morning patient and husband walked in the clinic

## 2018-02-18 NOTE — Consult Note (Signed)
NEURO HOSPITALIST CONSULT NOTE   Requesting physician: Dr. Rex Kras  Reason for Consult: Arm pain / left sided numbness and weakness  History obtained from:  Patient     HPI:                                                                                                                                          Lorraine [name] is an 32 y.o. female  With PMH, pre diabetes. Presents to ED with 3 day history of left side numbness and weakness.  Patient is spanish speaking only: family translated. Patient states that 3 days ago she had a sensation of whole body "sleepiness" and feeling "heavy" with numbness/tingling and diffuse weakness. She was unable to state when her right side became better, and her left side worsened, nor when her LUE stopped moving. Patient states that she cannot move her left side at all, but was able to move her left leg, just not her left arm. Denies trouble walking or joint pain. Patient stated she does have some CP, HA that feels like pressure in the back of her head, belly pain, and she has problems chewing and swallowing when she feels tired.  Past Medical History:  Diagnosis Date  . Asthma    Inhaler used 01/02/16  . Diverticulitis   . Nausea & vomiting 04/02/2017  . Pancreatitis   . Pre-diabetes   . Pre-diabetes   . Pyelonephritis   . Pyelonephritis     Past Surgical History:  Procedure Laterality Date  . CESAREAN SECTION    . CESAREAN SECTION N/A 2006  . CESAREAN SECTION N/A 09/03/2014   Procedure: CESAREAN SECTION;  Surgeon: Guss Bunde, MD;  Location: [facility];  Service: Obstetrics;  Laterality: N/A;  . CHOLECYSTECTOMY    . ESOPHAGOGASTRODUODENOSCOPY (EGD) WITH PROPOFOL Left 04/07/2017   Procedure: ESOPHAGOGASTRODUODENOSCOPY (EGD) WITH PROPOFOL;  Surgeon: Ronnette Juniper, MD;  Location: [facility];  Service: Gastroenterology;  Laterality: Left;  . TUBAL LIGATION      Family History  Problem Relation Age of Onset  .  Hyperlipidemia Mother   . Diabetes Father   . Diabetes Maternal Uncle   . Diabetes Paternal Grandmother   . Asthma Daughter   . Allergic rhinitis Neg Hx   . Angioedema Neg Hx   . Eczema Neg Hx   . Immunodeficiency Neg Hx   . Urticaria Neg Hx            Social History:  reports that she has never smoked. She has never used smokeless tobacco. She reports that she does not drink alcohol or use drugs.  Allergies  Allergen Reactions  . Morphine Rash    MEDICATIONS:  No current facility-administered medications for this encounter.    Current Outpatient Medications  Medication Sig Dispense Refill  . albuterol (PROVENTIL HFA;VENTOLIN HFA) 108 (90 Base) MCG/ACT inhaler Inhale 2 puffs into the lungs every 6 (six) hours as needed for wheezing or shortness of breath. 1 Inhaler 1  . EPINEPHrine 0.3 mg/0.3 mL IJ SOAJ injection Use as directed for severe allergic reaction 4 Device 1  . famotidine (PEPCID) 40 MG tablet Take 1 tablet (40 mg total) by mouth 2 (two) times daily. 30 tablet 0  . fluticasone furoate-vilanterol (BREO ELLIPTA) 100-25 MCG/INH AEPB Inhale 1 puff daily and brush teeth and tongue after use. 1 each 11  . montelukast (SINGULAIR) 10 MG tablet Take one tablet once at bedtime 34 tablet 5  . ranitidine (ZANTAC) 300 MG tablet Take one tablet once daily 34 tablet 5  . sertraline (ZOLOFT) 25 MG tablet Take 1 tablet (25 mg total) by mouth daily. 30 tablet 1      ROS:                                                                                                                                       History obtained from the patient  General ROS: negative for - chills, fatigue, fever, night sweats, weight gain or weight loss Psychological ROS: negative for - behavioral disorder, hallucinations, memory difficulties, mood swings or suicidal ideation Ophthalmic ROS:  positive - vision problems. Left eye, left peripheral vision distorted. Endocrine ROS: positive for p[re-diabetes Respiratory ROS: negative for - cough, hemoptysis, shortness of breath or wheezing Cardiovascular ROS: negative for - chest pain, dyspnea on exertion, edema or irregular heartbeat Gastrointestinal ROS: positive for belly pain Musculoskeletal ROS: positive for left side face, arm and leg numbness. Can't move left arm at all. Neurological ROS: as noted in HPI Dermatological ROS: negative for rash and skin lesion changes   Blood pressure 104/68, pulse 61, temperature 98.5 F (36.9 C), temperature source Oral, resp. rate 16, last menstrual period 01/20/2018, SpO2 100 %.   General Examination:                                                                                                      Physical Exam  HEENT-  Normocephalic, no lesions, without obvious abnormality.  Normal external eye and conjunctiva.  Cardiovascular-  pulses palpable throughout   Lungs- no excessive working breathing.  Saturations within normal limits Abdomen- All 4 quadrants palpated and nontender Extremities- Warm, dry and intact Musculoskeletal-left  side, numbness. Left arm not mobile Skin-warm and dry, no hyperpigmentation, vitiligo, or suspicious lesions  Neurological Examination Mental Status: Alert, oriented, thought content appropriate.  No aphasia or dysarthria noted.  Able to follow simple commands. Language barrier Cranial Nerves: II: visual fields normal in right eye, but patient states problems with left peripheral vision - able to see but with decreased acuity in left temporal visual field. III,IV, VI: ptosis not present, extra-ocular motions intact bilaterally. PERRL   V,VII: smile symmetric, facial light touch sensation present on right side. No sensation to temp or FT on left VIII: hearing intact to conversation IX,X: No hypophonia XI: shoulder shrug on right side; left side of shoulder  unable to move. XII: midline tongue extension Motor: Right : Upper extremity   5/5    Left:     Upper extremity  0/5   Lower extremity   5/5     Lower extremity   3-4/5 (There was some inconsistency in the degree of strength present on LLE exam) Muscle bulk normal x 4 Sensory: no temperature or light touch sensation on left side Deep Tendon Reflexes: 2+ and symmetric throughout Plantars: Right: downgoing   Left: downgoing Cerebellar: normal finger-to-nose on right sidde, left side unable to perform,  normal heel-to-shin test on right side. Left side unable to perform Gait unable to test   Lab Results: Basic Metabolic Panel: Recent Labs  Lab 02/17/18 0822 02/17/18 0833  NA 137 138  K 3.7 3.7  CL 105 104  CO2 24  --   GLUCOSE 105* 104*  BUN 16 16  CREATININE 0.76 0.60  CALCIUM 8.8*  --     CBC: Recent Labs  Lab 02/17/18 0822 02/17/18 0833  WBC 10.1  --   NEUTROABS 4.8  --   HGB 12.8 13.3  HCT 39.6 39.0  MCV 91.2  --   PLT 298  --     Cardiac Enzymes: No results for input(s): CKTOTAL, CKMB, CKMBINDEX, TROPONINI in the last 168 hours.  Lipid Panel: No results for input(s): CHOL, TRIG, HDL, CHOLHDL, VLDL, LDLCALC in the last 168 hours.  Imaging: Ct Head Wo Contrast  Result Date: 02/17/2018 CLINICAL DATA:  Left-sided heaviness and weakness for 2 days EXAM: CT HEAD WITHOUT CONTRAST TECHNIQUE: Contiguous axial images were obtained from the base of the skull through the vertex without intravenous contrast. COMPARISON:  August 01, 2017 FINDINGS: Brain: The ventricles are normal in size and configuration. 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 There is no intracranial mass, hemorrhage, extra-axial fluid collection, or midline shift. Gray-white compartments appear normal. No evident acute infarct. Vascular: There is no hyperdense vessel. There is no evident vascular calcification. Skull: Bony calvarium appears intact. Sinuses/Orbits: There is mucosal  thickening in the left maxillary antrum. There is mucosal thickening in several ethmoid air cells. Other visualized paranasal sinuses are clear. Orbits appear symmetric bilaterally. Other: Mastoid air cells are clear. 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 No intracranial mass or hemorrhage. Gray-white compartments appear normal. No evident acute infarct. There are foci of paranasal sinus disease. Electronically Signed   By: Lowella Grip III M.D.   On: 02/17/2018 08:58   History and examination documented by Laurey Morale, MSN, NP-C, [facility] [phone]   Impression:  32 year old female with plegic LUE and paretic LLE, insensate on the left. Symptoms evolved from diffuse weakness to left sided weakness at some time during the past 3  days, with patient unable to specify further. Onset of first symptoms 3 days PTA.  Exam findings are difficult to localize other than most likely supratentorial on the right. May be functional/conversion disorder given atypical presentation and affect not congruent with the degree of disability endorsed by the patient. Reflexes were symmetric and tone was normal bilaterally. There was some inconsistency in the degree of strength present on LLE exam. No facial droop which would be unexpected given presence of both LUE and LLE weakness.   Recommendations: 1. MRI brain 2. If MRI brain is negative, obtain MRI cervical spine  I have seen and examined the patient. I have amended the impression/recommendations above.  Electronically signed: Dr. Kerney Elbe 02/18/2018, 5:28 PM

## 2018-02-18 NOTE — ED Triage Notes (Addendum)
Pt complains of persistent left arm, face, back and neck pain that started upon waking yesterday. Patient states she went to the ED yesterday d/t unable to move her left side and it felt like it was asleep. She states she can move today but that her left arm feels heavy and her left neck, arm, and buttocks are still very painful.

## 2018-02-18 NOTE — ED Notes (Signed)
Pt called for vitals x3 no response 

## 2018-02-18 NOTE — ED Provider Notes (Signed)
[facility] EMERGENCY DEPARTMENT Provider Note   CSN: [id number] Arrival date & time: 02/18/18  1242     History   Chief Complaint Chief Complaint  Patient presents with  . Arm Pain    HPI Lorraine [name] is a 32 y.o. female presenting for evaluation of left arm weakness and numbness.  Patient states that the past 3 days, she has been having left arm symptoms.  She also reports a headache, left-sided facial numbness, and left-sided blurry vision.  She denies fall, trauma, or injury.  She denies history of similar.  She states everything feels like it is asleep on her left side.  She was evaluated yesterday, had reassuring labs and CT head.  She was seen in urgent care today, and recommended to come to the ER for further evaluation.  She reports central chest pressure.  She has chronic abdominal pain, this is unchanged from previous.  She reports pain of her neck and left shoulder.  She denies fevers, chills, cough, shortness of breath, nausea, vomiting, urinary symptoms, abnormal bowel movements.  She states her left leg feels asleep, but is not weak. She was recently seen in the hospital multiple times for cyclic urticaria, shortness of breath, and allergic reaction.  She was treated with multiple rounds of epinephrine, is currently still on prednisone. PMH of asthma and prediabetes.   HPI  Past Medical History:  Diagnosis Date  . Asthma    Inhaler used 01/02/16  . Diverticulitis   . Nausea & vomiting 04/02/2017  . Pancreatitis   . Pre-diabetes   . Pre-diabetes   . Pyelonephritis   . Pyelonephritis     Patient Active Problem List   Diagnosis Date Noted  . Rash 02/09/2018  . Chest pain 02/08/2018  . Acute pancreatitis 04/02/2017  . Abdominal pain, acute, left upper quadrant 04/02/2017  . Intractable nausea and vomiting 04/02/2017  . Asthma in adult 04/02/2017  . Obesity (BMI 30.0-34.9) 04/02/2017  . Fatty infiltration of liver 04/02/2017  .  Gastroesophageal reflux disease without esophagitis   . Pre-diabetes 07/15/2015  . Diarrhea   . Hypokalemia   . Bacterial vaginosis   . Pyelonephritis 07/07/2015  . Abdominal pain, right upper quadrant   . Asthma 10/31/2014  . History of preterm delivery 10/22/2014  . Status post repeat low transverse cesarean section 09/06/2014  . Diverticulitis 09/10/2012    Past Surgical History:  Procedure Laterality Date  . CESAREAN SECTION    . CESAREAN SECTION N/A 2006  . CESAREAN SECTION N/A 09/03/2014   Procedure: CESAREAN SECTION;  Surgeon: Guss Bunde, MD;  Location: [facility];  Service: Obstetrics;  Laterality: N/A;  . CHOLECYSTECTOMY    . ESOPHAGOGASTRODUODENOSCOPY (EGD) WITH PROPOFOL Left 04/07/2017   Procedure: ESOPHAGOGASTRODUODENOSCOPY (EGD) WITH PROPOFOL;  Surgeon: Ronnette Juniper, MD;  Location: [facility];  Service: Gastroenterology;  Laterality: Left;  . TUBAL LIGATION       OB History    Gravida  5   Para  3   Term  1   Preterm  2   AB  2   Living  3     SAB  2   TAB      Ectopic      Multiple  1   Live Births  3            Home Medications    Prior to Admission medications   Medication Sig Start Date End Date Taking? Authorizing Provider  albuterol (PROVENTIL HFA;VENTOLIN HFA) 108 (  90 Base) MCG/ACT inhaler Inhale 2 puffs into the lungs every 6 (six) hours as needed for wheezing or shortness of breath. 02/11/18  Yes Mack Hook, MD  EPINEPHrine 0.3 mg/0.3 mL IJ SOAJ injection Use as directed for severe allergic reaction 02/14/18  Yes Valentina Shaggy, MD  famotidine (PEPCID) 40 MG tablet Take 1 tablet (40 mg total) by mouth 2 (two) times daily. 02/08/18  Yes Maczis, Barth Kirks, PA-C  fluticasone furoate-vilanterol (BREO ELLIPTA) 100-25 MCG/INH AEPB Inhale 1 puff daily and brush teeth and tongue after use. 02/11/18  Yes Mack Hook, MD  montelukast (SINGULAIR) 10 MG tablet Take one tablet once at bedtime 02/14/18  Yes Valentina Shaggy, MD  ranitidine (ZANTAC) 300 MG tablet Take one tablet once daily 02/14/18  Yes Valentina Shaggy, MD  sertraline (ZOLOFT) 25 MG tablet Take 1 tablet (25 mg total) by mouth daily. 02/11/18  Yes Mack Hook, MD    Family History Family History  Problem Relation Age of Onset  . Hyperlipidemia Mother   . Diabetes Father   . Diabetes Maternal Uncle   . Diabetes Paternal Grandmother   . Asthma Daughter   . Allergic rhinitis Neg Hx   . Angioedema Neg Hx   . Eczema Neg Hx   . Immunodeficiency Neg Hx   . Urticaria Neg Hx     Social History Social History   Tobacco Use  . Smoking status: Never Smoker  . Smokeless tobacco: Never Used  Substance Use Topics  . Alcohol use: No  . Drug use: No     Allergies   Morphine   Review of Systems Review of Systems  Eyes: Positive for visual disturbance.  Musculoskeletal: Positive for neck pain.  Neurological: Positive for weakness and numbness.  All other systems reviewed and are negative.    Physical Exam Updated Vital Signs BP 110/63 (BP Location: Right Arm)   Pulse 67   Temp 98.1 F (36.7 C) (Oral)   Resp 18   LMP 01/20/2018   SpO2 96%   Physical Exam  Constitutional: She is oriented to person, place, and time. She appears well-developed and well-nourished. No distress.  Appears in NAD  HENT:  Head: Normocephalic and atraumatic.  Right Ear: Tympanic membrane, external ear and ear canal normal.  Left Ear: Tympanic membrane, external ear and ear canal normal.  Nose: Nose normal.  Mouth/Throat: Uvula is midline, oropharynx is clear and moist and mucous membranes are normal.  Pt reports no sensation of L side face  Eyes: Pupils are equal, round, and reactive to light. Conjunctivae and EOM are normal.  EOMI and perrla. No nystagmus  Neck: Normal range of motion. Neck supple.  TTP of c-spine withut step offs  Cardiovascular: Normal rate, regular rhythm and intact distal pulses.  Pulmonary/Chest: Effort  normal and breath sounds normal. No respiratory distress. She has no wheezes. She exhibits tenderness.  TTP of anterior chest wall  Abdominal: Soft. She exhibits no distension and no mass. There is no tenderness. There is no guarding.  Musculoskeletal: Normal range of motion.  Grip strength 1/5 of L hand, 5/5 of R hand. Pt unable to hold arm up against gravity. Can extend L hand, no other active ROM of LUE. TTP of upper back and L shoulder. Radial pulses intact bilaterally. Strength of lower extremities intact bilaterally. Patellar reflexes intact.   Neurological: She is alert and oriented to person, place, and time. She has normal reflexes. A sensory deficit is present. She exhibits abnormal muscle  tone. GCS eye subscore is 4. GCS verbal subscore is 5. GCS motor subscore is 6.  Decreased sensation and strength of L upper extremity.   Skin: Skin is warm and dry. Capillary refill takes less than 2 seconds. No rash noted.  Psychiatric: She has a normal mood and affect.  Nursing note and vitals reviewed.    ED Treatments / Results  Labs (all labs ordered are listed, but only abnormal results are displayed) Labs Reviewed - No data to display  EKG None  Radiology Ct Head Wo Contrast  Result Date: 02/17/2018 CLINICAL DATA:  Left-sided heaviness and weakness for 2 days EXAM: CT HEAD WITHOUT CONTRAST TECHNIQUE: Contiguous axial images were obtained from the base of the skull through the vertex without intravenous contrast. COMPARISON:  August 01, 2017 FINDINGS: Brain: The ventricles are normal in size and configuration. 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 There is no intracranial mass, hemorrhage, extra-axial fluid collection, or midline shift. Gray-white compartments appear normal. No evident acute infarct. Vascular: There is no hyperdense vessel. There is no evident vascular calcification. Skull: Bony calvarium appears intact. Sinuses/Orbits: There is mucosal thickening  in the left maxillary antrum. There is mucosal thickening in several ethmoid air cells. Other visualized paranasal sinuses are clear. Orbits appear symmetric bilaterally. Other: Mastoid air cells are clear. 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 No intracranial mass or hemorrhage. Gray-white compartments appear normal. No evident acute infarct. There are foci of paranasal sinus disease. Electronically Signed   By: Lowella Grip III M.D.   On: 02/17/2018 08:58   Mr Jeri Cos KG Contrast  Result Date: 02/18/2018 CLINICAL DATA:  Left-sided pain.  Headache EXAM: MRI HEAD WITHOUT AND WITH CONTRAST TECHNIQUE: Multiplanar, multiecho pulse sequences of the brain and surrounding structures were obtained without and with intravenous contrast. CONTRAST:  40mL MULTIHANCE GADOBENATE DIMEGLUMINE 529 MG/ML IV SOLN COMPARISON:  Head CT 02/17/2018 FINDINGS: BRAIN: Partially empty sella. There is no acute infarct or acute hemorrhage. There is no mass lesion or other mass effect. There is no hydrocephalus, dural abnormality or extra-axial collection. The white matter signal is normal for the patient's age. No age-advanced or lobar predominant atrophy. No chronic microhemorrhage or superficial siderosis. No abnormal contrast enhancement. VASCULAR: Major intracranial arterial and venous sinus flow voids are preserved. SKULL AND UPPER CERVICAL SPINE: The visualized skull base, calvarium, upper cervical spine and extracranial soft tissues are normal. SINUSES/ORBITS: No fluid levels or advanced mucosal thickening. No mastoid or middle ear effusion. The orbits are normal. IMPRESSION: Normal MRI of the brain. Electronically Signed   By: Ulyses Jarred M.D.   On: 02/18/2018 22:44    Procedures Procedures (including critical care time)  Medications Ordered in ED Medications  ketorolac (TORADOL) 15 MG/ML injection 15 mg (15 mg Intravenous Given 02/18/18 2057)  gadobenate  dimeglumine (MULTIHANCE) injection 17 mL (17 mLs Intravenous Contrast Given 02/18/18 2200)  fentaNYL (SUBLIMAZE) injection 25 mcg (25 mcg Intravenous Given 02/19/18 0025)     Initial Impression / Assessment and Plan / ED Course  I have reviewed the triage vital signs and the nursing notes.  Pertinent labs & imaging results that were available during my care of the patient were reviewed by me and considered in my medical decision making (see chart for details).     Patient presenting for evaluation of left arm numbness and weakness.  Physical exam shows patient with decreased grip strength and inability to move her  arm.  Symptoms have been present over the past 2 days without change.  Will consult with neurology for further management.  Discussed with Dr. Cheral Marker, who will evaluate the patient and place orders as needed.  MRI brain ordered.  MRI brain without acute findings.  On reviewing Dr. Jenna Luo note, recommended MRI cervical if MRI brain negative.  Discussed with Dr. Laurance Flatten from neurology service, who stated MRI may be done outpatient or in the ED, but if she has continued plegia, consider further evaluation.  Will order MRI cervical.  Discussed with patient, who is agreeable to stay.  Will give fentanyl for pain control.  Discussed that if MRI is negative, this is likely conversion disorder, she should follow-up with primary care.  Pt signed out to our [place], [place] pending MRI.   Final Clinical Impressions(s) / ED Diagnoses   Final diagnoses:  None    ED Discharge Orders    None       Franchot Heidelberg, PA-C 02/19/18 0102    Little, Wenda Overland, MD 02/19/18 1433

## 2018-02-18 NOTE — Telephone Encounter (Signed)
Patient and husband walked in to inquire about lab results advised that labs can take up to 7 days before resulted. Patient states that insurance will not cover medications. Writer did call pharmacy to check status and was told that patient only has family planning waiver and will not pay would have to be self pay. Writer did call husband back and explained that she would need to go to [facility] office and fix this. Husband verbalized understanding and will take wife to get this fixed.

## 2018-02-18 NOTE — ED Provider Notes (Signed)
[facility]    CSN: [id number] Arrival date & time: 02/18/18  1009     History   Chief Complaint Chief Complaint  Patient presents with  . Arm Pain    HPI Lorraine [name] is a 32 y.o. female.   32 year old female comes in for 2-3 day history of left sided numbness/weakness. States she woke up with the symptoms. Denies injury/trauma. She has intermittent left sided pain and numbness, worse with walking. States it extends from her left side face down to the lower extremity.  The pain and numbness happens at the same time.  States it feels like a stabbing/cutting pain.  States she feels disoriented, lightheaded without syncope.  Denies fever, chills, night sweats.  Had a sore throat, without other URI symptoms.  Denies any new medications.     Past Medical History:  Diagnosis Date  . Asthma    Inhaler used 01/02/16  . Diverticulitis   . Nausea & vomiting 04/02/2017  . Pancreatitis   . Pre-diabetes   . Pre-diabetes   . Pyelonephritis   . Pyelonephritis     Patient Active Problem List   Diagnosis Date Noted  . Rash 02/09/2018  . Chest pain 02/08/2018  . Acute pancreatitis 04/02/2017  . Abdominal pain, acute, left upper quadrant 04/02/2017  . Intractable nausea and vomiting 04/02/2017  . Asthma in adult 04/02/2017  . Obesity (BMI 30.0-34.9) 04/02/2017  . Fatty infiltration of liver 04/02/2017  . Gastroesophageal reflux disease without esophagitis   . Pre-diabetes 07/15/2015  . Diarrhea   . Hypokalemia   . Bacterial vaginosis   . Pyelonephritis 07/07/2015  . Abdominal pain, right upper quadrant   . Asthma 10/31/2014  . History of preterm delivery 10/22/2014  . Status post repeat low transverse cesarean section 09/06/2014  . Diverticulitis 09/10/2012    Past Surgical History:  Procedure Laterality Date  . CESAREAN SECTION    . CESAREAN SECTION N/A 2006  . CESAREAN SECTION N/A 09/03/2014   Procedure: CESAREAN SECTION;  Surgeon: Guss Bunde, MD;   Location: [facility];  Service: Obstetrics;  Laterality: N/A;  . CHOLECYSTECTOMY    . ESOPHAGOGASTRODUODENOSCOPY (EGD) WITH PROPOFOL Left 04/07/2017   Procedure: ESOPHAGOGASTRODUODENOSCOPY (EGD) WITH PROPOFOL;  Surgeon: Ronnette Juniper, MD;  Location: [facility];  Service: Gastroenterology;  Laterality: Left;  . TUBAL LIGATION      OB History    Gravida  5   Para  3   Term  1   Preterm  2   AB  2   Living  3     SAB  2   TAB      Ectopic      Multiple  1   Live Births  3            Home Medications    Prior to Admission medications   Medication Sig Start Date End Date Taking? Authorizing Provider  albuterol (PROVENTIL HFA;VENTOLIN HFA) 108 (90 Base) MCG/ACT inhaler Inhale 2 puffs into the lungs every 6 (six) hours as needed for wheezing or shortness of breath. 02/11/18  Yes Mack Hook, MD  famotidine (PEPCID) 40 MG tablet Take 1 tablet (40 mg total) by mouth 2 (two) times daily. 02/08/18  Yes Maczis, Barth Kirks, PA-C  fluticasone furoate-vilanterol (BREO ELLIPTA) 100-25 MCG/INH AEPB Inhale 1 puff daily and brush teeth and tongue after use. 02/11/18  Yes Mack Hook, MD  montelukast (SINGULAIR) 10 MG tablet Take one tablet once at bedtime 02/14/18  Yes Ernst Bowler,  Gwenith Daily, MD  ranitidine (ZANTAC) 300 MG tablet Take one tablet once daily 02/14/18  Yes Valentina Shaggy, MD  sertraline (ZOLOFT) 25 MG tablet Take 1 tablet (25 mg total) by mouth daily. 02/11/18  Yes Mack Hook, MD  triamcinolone cream (KENALOG) 0.1 % Apply topically. 02/12/18 02/12/19 Yes [provider]  EPINEPHrine 0.3 mg/0.3 mL IJ SOAJ injection Use as directed for severe allergic reaction 02/14/18   Valentina Shaggy, MD    Family History Family History  Problem Relation Age of Onset  . Hyperlipidemia Mother   . Diabetes Father   . Diabetes Maternal Uncle   . Diabetes Paternal Grandmother   . Asthma Daughter   . Allergic rhinitis Neg Hx   . Angioedema Neg Hx   .  Eczema Neg Hx   . Immunodeficiency Neg Hx   . Urticaria Neg Hx     Social History Social History   Tobacco Use  . Smoking status: Never Smoker  . Smokeless tobacco: Never Used  Substance Use Topics  . Alcohol use: No  . Drug use: No     Allergies   Morphine   Review of Systems Review of Systems  Reason unable to perform ROS: See HPI as above.     Physical Exam Triage Vital Signs ED Triage Vitals  Enc Vitals Group     BP 02/18/18 1048 130/82     Pulse Rate 02/18/18 1046 85     Resp 02/18/18 1046 20     Temp 02/18/18 1046 98.4 F (36.9 C)     Temp Source 02/18/18 1046 Oral     SpO2 02/18/18 1046 99 %     Weight 02/18/18 1048 180 lb (81.6 kg)     Height 02/18/18 1048 5' (1.524 m)     Head Circumference --      Peak Flow --      Pain Score 02/18/18 1047 5     Pain Loc --      Pain Edu? --      Excl. in [place]? --    No data found.  Updated Vital Signs BP 130/82   Pulse 85   Temp 98.4 F (36.9 C) (Oral)   Resp 20   Ht 5' (1.524 m)   Wt 180 lb (81.6 kg)   LMP 01/20/2018   SpO2 99%   BMI 35.15 kg/m   Physical Exam  Constitutional: She is oriented to person, place, and time. She appears well-developed and well-nourished. No distress.  HENT:  Head: Normocephalic and atraumatic.  Sensation decreased on left side of face. Can feel slight pressure with sharp object.   Eyes: Pupils are equal, round, and reactive to light. Conjunctivae are normal.  Neck: Normal range of motion. Neck supple. Muscular tenderness present. No spinous process tenderness present. Normal range of motion present.  Cardiovascular: Normal rate, regular rhythm and normal heart sounds. Exam reveals no gallop and no friction rub.  No murmur heard. Pulmonary/Chest: Effort normal and breath sounds normal. No stridor. She has no decreased breath sounds. She has no wheezes. She has no rhonchi. She has no rales.  Musculoskeletal:  No tenderness to palpation of spinous processes. Diffuse  tenderness to palpation of left back.   Unable to move left shoulder/arm. Full passive ROM of shoulder/arm without resistence. Strength 1/5 attempting grip strength. States unable to feel palpation/sharp probing. Unable to feel sharp object on bottom of foot.  Neurological: She is alert and oriented to person, place, and time. She is  not disoriented.  Able to recount story on own.Patient can ambulate on own with slight hesitancy. No obvious limping/abnormal gait while walking.   Skin: Skin is warm and dry. She is not diaphoretic.     UC Treatments / Results  Labs (all labs ordered are listed, but only abnormal results are displayed) Labs Reviewed - No data to display  EKG None  Radiology Ct Head Wo Contrast  Result Date: 02/17/2018 CLINICAL DATA:  Left-sided heaviness and weakness for 2 days EXAM: CT HEAD WITHOUT CONTRAST TECHNIQUE: Contiguous axial images were obtained from the base of the skull through the vertex without intravenous contrast. COMPARISON:  August 01, 2017 FINDINGS: Brain: The ventricles are normal in size and configuration. 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 There is no intracranial mass, hemorrhage, extra-axial fluid collection, or midline shift. Gray-white compartments appear normal. No evident acute infarct. Vascular: There is no hyperdense vessel. There is no evident vascular calcification. Skull: Bony calvarium appears intact. Sinuses/Orbits: There is mucosal thickening in the left maxillary antrum. There is mucosal thickening in several ethmoid air cells. Other visualized paranasal sinuses are clear. Orbits appear symmetric bilaterally. Other: Mastoid air cells are clear. 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 No intracranial mass or hemorrhage. Gray-white compartments appear normal. No evident acute infarct. There are foci of paranasal sinus disease. Electronically Signed   By: Lowella Grip III M.D.   On: 02/17/2018 08:58    Procedures Procedures (including critical care time)  Medications Ordered in UC Medications - No data to display  Initial Impression / Assessment and Plan / UC Course  I have reviewed the triage vital signs and the nursing notes.  Pertinent labs & imaging results that were available during my care of the patient were reviewed by me and considered in my medical decision making (see chart for details).  32 year old female comes in with continued left sided numbness/pain after evaluation at the emergency department yesterday. At that time, blood work and head CT were nonconcerning for stroke. However, continued symptoms with decreased sensation and worsening symptoms. Tried paging on-call neurology for recommendations of possible MRI at the ED vs clinic follow up given continued numbness/pain of the left body with concerning neurology exam. Unable to reach neurology at urgent care, will discharge patient in stable condition to the emergency department for further evaluation. Case discussed with Dr Alphonzo Cruise, who agrees to plan.  Final Clinical Impressions(s) / UC Diagnoses   Final diagnoses:  Left sided numbness    ED Prescriptions    None       Ok Edwards, PA-C 02/18/18 1235

## 2018-02-18 NOTE — ED Triage Notes (Addendum)
Left arm pain  X 3 days  Numbness was seen and sent here denies injury, was seen yesterday for same  Having pain  Left side also

## 2018-02-18 NOTE — ED Provider Notes (Signed)
Patient placed in Quick Look pathway, seen and evaluated   Chief Complaint: left side pain  HPI:   Lorraine [name] is a 32 y.o. female who presents to the ED with c/o left side head, neck and body pain. Patient was evaluated here in the ED yesterday for same by Dr. Vanita Panda and had CT scan and blood work. She was d/c home. Today she went to Urgent Care and was sent back to the ED.   ROS: Neuro: left side pain and feeling week. headache  Physical Exam:  BP 115/77 (BP Location: Right Arm)   Pulse 86   Temp 98.5 F (36.9 C) (Oral)   Resp 16   LMP 01/20/2018   SpO2 99%    Gen: No distress  Neuro: Awake and Alert, ambulatory to triage without difficulty. Patient will not grip with left hand.   Skin: Warm and dry     Initiation of care has begun. The patient has been counseled on the process, plan, and necessity for staying for the completion/evaluation, and the remainder of the medical screening examination    Ashley Murrain, NP 02/18/18 1326    Carmin Muskrat, MD 02/21/18 2344

## 2018-02-19 ENCOUNTER — Encounter (HOSPITAL_COMMUNITY): Payer: Self-pay | Admitting: Registered Nurse

## 2018-02-19 ENCOUNTER — Emergency Department (HOSPITAL_COMMUNITY): Payer: Self-pay

## 2018-02-19 DIAGNOSIS — R29898 Other symptoms and signs involving the musculoskeletal system: Secondary | ICD-10-CM

## 2018-02-19 DIAGNOSIS — R531 Weakness: Secondary | ICD-10-CM

## 2018-02-19 LAB — TSH: TSH: 0.583 u[IU]/mL (ref 0.350–4.500)

## 2018-02-19 MED ORDER — SODIUM CHLORIDE 0.9% FLUSH: 3.0000 mL | INTRAVENOUS | Status: DC | PRN

## 2018-02-19 MED ORDER — ACETAMINOPHEN 650 MG RE SUPP: 650.0000 mg | Freq: Four times a day (QID) | RECTAL | Status: DC | PRN

## 2018-02-19 MED ORDER — ACETAMINOPHEN 325 MG PO TABS
650.0000 mg | ORAL_TABLET | Freq: Once | ORAL | Status: AC
  Administered 2018-02-19: 650 mg via ORAL
  Filled 2018-02-19: qty 2

## 2018-02-19 MED ORDER — ACETAMINOPHEN 325 MG PO TABS
650.0000 mg | ORAL_TABLET | Freq: Four times a day (QID) | ORAL | Status: DC | PRN
  Administered 2018-02-20 – 2018-02-25 (×6): 650 mg via ORAL
  Filled 2018-02-19 (×6): qty 2

## 2018-02-19 MED ORDER — MONTELUKAST SODIUM 10 MG PO TABS
10.0000 mg | ORAL_TABLET | Freq: Every day | ORAL | Status: DC
  Administered 2018-02-19 – 2018-02-24 (×6): 10 mg via ORAL
  Filled 2018-02-19 (×6): qty 1

## 2018-02-19 MED ORDER — CYCLOBENZAPRINE HCL 10 MG PO TABS: 10.0000 mg | ORAL_TABLET | Freq: Every day | ORAL | 0 refills | Status: DC

## 2018-02-19 MED ORDER — SERTRALINE HCL 25 MG PO TABS
25.0000 mg | ORAL_TABLET | Freq: Every day | ORAL | Status: DC
  Administered 2018-02-19 – 2018-02-22 (×4): 25 mg via ORAL
  Filled 2018-02-19 (×4): qty 1

## 2018-02-19 MED ORDER — FENTANYL CITRATE (PF) 100 MCG/2ML IJ SOLN
25.0000 ug | Freq: Once | INTRAMUSCULAR | Status: AC
  Administered 2018-02-19: 25 ug via INTRAVENOUS
  Filled 2018-02-19: qty 2

## 2018-02-19 MED ORDER — PREDNISONE 50 MG PO TABS: 50.0000 mg | ORAL_TABLET | Freq: Every day | ORAL | 0 refills | Status: DC

## 2018-02-19 MED ORDER — ALBUTEROL SULFATE (2.5 MG/3ML) 0.083% IN NEBU
3.0000 mL | INHALATION_SOLUTION | Freq: Four times a day (QID) | RESPIRATORY_TRACT | Status: DC | PRN
Start: 2018-02-19 — End: 2018-02-25

## 2018-02-19 MED ORDER — TRAMADOL HCL 50 MG PO TABS: 50.0000 mg | ORAL_TABLET | Freq: Four times a day (QID) | ORAL | 0 refills | Status: DC | PRN

## 2018-02-19 MED ORDER — SODIUM CHLORIDE 0.9 % IV SOLN: 250.0000 mL | INTRAVENOUS | Status: DC | PRN

## 2018-02-19 MED ORDER — SODIUM CHLORIDE 0.9% FLUSH
3.0000 mL | Freq: Two times a day (BID) | INTRAVENOUS | Status: DC
  Administered 2018-02-19 – 2018-02-25 (×11): 3 mL via INTRAVENOUS

## 2018-02-19 MED ORDER — FLUTICASONE FUROATE-VILANTEROL 100-25 MCG/INH IN AEPB
1.0000 | INHALATION_SPRAY | Freq: Every day | RESPIRATORY_TRACT | Status: DC
  Administered 2018-02-20 – 2018-02-25 (×6): 1 via RESPIRATORY_TRACT
  Filled 2018-02-19: qty 28

## 2018-02-19 MED ORDER — FAMOTIDINE 20 MG PO TABS
40.0000 mg | ORAL_TABLET | Freq: Two times a day (BID) | ORAL | Status: DC
  Administered 2018-02-19 – 2018-02-25 (×12): 40 mg via ORAL
  Filled 2018-02-19 (×12): qty 2

## 2018-02-19 NOTE — ED Notes (Signed)
Pt in MRI.

## 2018-02-19 NOTE — BH Assessment (Signed)
Tele Assessment Note   Patient Name: Lorraine [name] MRN: [id number] Referring Physician: Irena Cords, PA Location of Patient: [facility] Location of Provider: [facility] is an 32 y.o. female who presents voluntarily accompanied by her husband Jamesetta Geralds reporting primary symptoms of num,bness and inability to move one arm. Pt states that she has been in and out of the hospital with problems with allergies, and she began having problems feeling numb after getting shots at a clinic.    Pt admits to feeling depressed about her relationship with her teen aged daughter recently and was recently started on Zoloft last week (prescribed by someone named Benjamine Mola), and acknowledges symptoms including some social withdrawal, loss of interest in usual pleasures, fatigue,  decreased sleep, decreased appetite. Pt has no previous psych history and denies SI, HI, psychosis, SA. PT describes 0 past attempts, history of violence.  Pt identifies primary residence as with her husband and 2 kids. Pt identifies primary supports as her church. Pt identifies denies legal involvement. Pt denies abuse history.  Pt reports medication compliance. Pt describes no family MH/SA history.  Pt has good insight and judgment. Pt's memory is typical .?  MSE: Pt is casually dressed, alert, oriented x4 with normal speech and normal motor behavior. Eye contact is good. Pt's mood is depressed and affect is depressed and anxious. Affect is congruent with mood. Thought process is coherent and relevant. There is no indication that pt is currently responding to internal stimuli or experiencing delusional thought content. Pt was cooperative throughout assessment.   Shuvon Rankin, NP recommended referral back to current outpatient provider for psychiatric treatment and states that pt is psych cleared.  Diagnosis: Depression  Past Medical History:  Past Medical History:  Diagnosis Date  . Asthma     Inhaler used 01/02/16  . Diverticulitis   . Nausea & vomiting 04/02/2017  . Pancreatitis   . Pre-diabetes   . Pre-diabetes   . Pyelonephritis   . Pyelonephritis     Past Surgical History:  Procedure Laterality Date  . CESAREAN SECTION    . CESAREAN SECTION N/A 2006  . CESAREAN SECTION N/A 09/03/2014   Procedure: CESAREAN SECTION;  Surgeon: Guss Bunde, MD;  Location: [facility];  Service: Obstetrics;  Laterality: N/A;  . CHOLECYSTECTOMY    . ESOPHAGOGASTRODUODENOSCOPY (EGD) WITH PROPOFOL Left 04/07/2017   Procedure: ESOPHAGOGASTRODUODENOSCOPY (EGD) WITH PROPOFOL;  Surgeon: Ronnette Juniper, MD;  Location: [place];  Service: Gastroenterology;  Laterality: Left;  . TUBAL LIGATION      Family History:  Family History  Problem Relation Age of Onset  . Hyperlipidemia Mother   . Diabetes Father   . Diabetes Maternal Uncle   . Diabetes Paternal Grandmother   . Asthma Daughter   . Allergic rhinitis Neg Hx   . Angioedema Neg Hx   . Eczema Neg Hx   . Immunodeficiency Neg Hx   . Urticaria Neg Hx     Social History:  reports that she has never smoked. She has never used smokeless tobacco. She reports that she does not drink alcohol or use drugs.  Additional Social History:  Alcohol / Drug Use Pain Medications: denies Prescriptions: denies Over the Counter: denies History of alcohol / drug use?: No history of alcohol / drug abuse Longest period of sobriety (when/how long): denies Negative Consequences of Use: (denies) Withdrawal Symptoms: (denies)  CIWA: CIWA-Ar BP: 136/60 Pulse Rate: 75 COWS:    Allergies:  Allergies  Allergen Reactions  . Morphine  Rash    Home Medications:  (Not in a hospital admission)  OB/GYN Status:  Patient's last menstrual period was 01/20/2018.  General Assessment Data Location of Assessment: [facility] ED TTS Assessment: In system Is this a Tele or Face-to-Face Assessment?: Tele Assessment Is this an Initial Assessment or a Re-assessment for  this encounter?: Initial Assessment Marital status: Married Living Arrangements: Spouse/significant other, Children Can pt return to current living arrangement?: Yes Admission Status: Voluntary Is patient capable of signing voluntary admission?: Yes Referral Source: Self/Family/Friend Insurance type: MCD     Crisis Care Plan Living Arrangements: Spouse/significant other, Children Legal Guardian: (none) Name of Psychiatrist: Benjamine Mola Name of Therapist: none  Education Status Is patient currently in school?: No Is the patient employed, unemployed or receiving disability?: Unemployed  Risk to self with the past 6 months Suicidal Ideation: No Has patient been a risk to self within the past 6 months prior to admission? : No Suicidal Intent: No Has patient had any suicidal intent within the past 6 months prior to admission? : No Is patient at risk for suicide?: No Suicidal Plan?: No Has patient had any suicidal plan within the past 6 months prior to admission? : No Access to Means: No What has been your use of drugs/alcohol within the last 12 months?: (denies) Previous Attempts/Gestures: No Intentional Self Injurious Behavior: None Family Suicide History: No Recent stressful life event(s): Conflict (Comment)(relationship with teenaged daughter) Persecutory voices/beliefs?: No Depression: Yes Depression Symptoms: Insomnia, Fatigue Substance abuse history and/or treatment for substance abuse?: No Suicide prevention information given to non-admitted patients: Not applicable  Risk to Others within the past 6 months Homicidal Ideation: No Does patient have any lifetime risk of violence toward others beyond the six months prior to admission? : No Thoughts of Harm to Others: No Current Homicidal Intent: No Current Homicidal Plan: No Access to Homicidal Means: No History of harm to others?: No Assessment of Violence: None Noted Does patient have access to weapons?: No Criminal  Charges Pending?: No Does patient have a court date: No Is patient on probation?: No  Psychosis Hallucinations: None noted Delusions: None noted  Mental Status Report Appearance/Hygiene: Unremarkable Eye Contact: Good Motor Activity: Unremarkable Speech: Logical/coherent Level of Consciousness: Alert Mood: Anxious, Depressed Affect: Anxious Anxiety Level: Moderate Thought Processes: Coherent, Relevant Judgement: Unimpaired Orientation: Person, Place, Time, Situation, Appropriate for developmental age Obsessive Compulsive Thoughts/Behaviors: None  Cognitive Functioning Concentration: Good Memory: Recent Intact, Remote Intact Is patient IDD: No Is patient DD?: No Insight: Good Impulse Control: Good Appetite: Good Have you had any weight changes? : No Change Sleep: Decreased Total Hours of Sleep: 3 Vegetative Symptoms: None  ADLScreening [facility] Assessment Services) Patient's cognitive ability adequate to safely complete daily activities?: Yes Patient able to express need for assistance with ADLs?: Yes Independently performs ADLs?: Yes (appropriate for developmental age)  Prior Inpatient Therapy Prior Inpatient Therapy: Yes Prior Therapy Dates: (just started a couple of weeks ago) Prior Therapy Facilty/Provider(s): Benjamine Mola") Reason for Treatment: (depression)  Prior Outpatient Therapy Prior Outpatient Therapy: No Does patient have an ACCT team?: No Does patient have Intensive In-House Services?  : No Does patient have [facility] services? : No Does patient have P4CC services?: No  ADL Screening (condition at time of admission) Patient's cognitive ability adequate to safely complete daily activities?: Yes Is the patient deaf or have difficulty hearing?: No Does the patient have difficulty seeing, even when wearing glasses/contacts?: No Does the patient have difficulty concentrating, remembering, or making decisions?: No Patient able  to express need for assistance  with ADLs?: Yes Does the patient have difficulty dressing or bathing?: No Independently performs ADLs?: Yes (appropriate for developmental age) Does the patient have difficulty walking or climbing stairs?: No Weakness of Legs: None Weakness of Arms/Hands: None  Home Assistive Devices/Equipment Home Assistive Devices/Equipment: Eyeglasses  Therapy Consults (therapy consults require a physician order) PT Evaluation Needed: No OT Evalulation Needed: No SLP Evaluation Needed: No Abuse/Neglect Assessment (Assessment to be complete while patient is alone) Abuse/Neglect Assessment Can Be Completed: Yes Physical Abuse: Denies Verbal Abuse: Denies Sexual Abuse: Denies Exploitation of patient/patient's resources: Denies Self-Neglect: Denies Values / Beliefs Cultural Requests During Hospitalization: None Spiritual Requests During Hospitalization: None Consults Spiritual Care Consult Needed: No Social Work Consult Needed: No Regulatory affairs officer (For Healthcare) Does Patient Have a Medical Advance Directive?: No Would patient like information on creating a medical advance directive?: No - Patient declined          Disposition:  Disposition Initial Assessment Completed for this Encounter: Yes Patient referred to: (current OP provider)  This service was provided via telemedicine using a 2-way, interactive audio and Radiographer, therapeutic.  Names of all persons participating in this telemedicine service and their role in this encounter. Name: Ramiro Role:  (husband)             Sheliah Hatch 02/19/2018 5:31 PM

## 2018-02-19 NOTE — ED Notes (Signed)
Pt unable to hold arm above forehead without hitting self in face. Irena Cords, PA notified

## 2018-02-19 NOTE — Progress Notes (Addendum)
MRI brain was normal. MRI cervical spine has been ordered and is pending. PT and OT are also recommended.  Electronically signed: Dr. Kerney Elbe

## 2018-02-19 NOTE — ED Notes (Signed)
Pt has no sensation of being pinched on left arm, left upper chest, left upper leg.

## 2018-02-19 NOTE — Consult Note (Signed)
Tele Assessment   Lorraine [name], 32 y.o., female patient presented to [facility] with complaints of left sided weakness and numbness; now she is unable to lift her left arm.  Patient seen via telepsych by this provider; chart reviewed and consulted with Dr. Dwyane Dee on 02/19/18.  On evaluation Lorraine [name] reports that she has been feeling sick on and off with a virus for several weeks; states that she went to clinic and was given an injection states that she called the clinic and told them she was feeling numbness and they told her that it was possible side effect of but should be fine.  States she was already feeling numb and would ask her husband to pinch her and she would not be able to feel it; and it continued to worsen to the point where she could not lift her left arm.  Patient does have a history of depression and was started on Zoloft but was having problems with the numbness before starting Zoloft.  Patient denies suicidal/self-harm/homicidal ideation, psychosis, and paranoia.  Patient psychiatrically cleared.    For a detailed note see TTS assessment note   Recommendations:  Psychiatrically cleared.    Disposition: No evidence of imminent risk to self or others at present.   Patient does not meet criteria for psychiatric inpatient admission.  Spoke with Dr. Regenia Skeeter and informed of patient assessment and that patient was psychiatrically cleared.   Desaree Downen B. Bernardino Dowell, NP

## 2018-02-19 NOTE — Discharge Instructions (Addendum)
1)Return here as needed.  Follow-up with the doctor provided.  Your MRI did not show any significant abnormality at this time. 2) fall precautions (avoid falling)- Avoid working on ladders or at heights.  Ensure the water temperature is not too high on the home water heater. Do not go swimming alone. When caring for infants or small children, sit down when holding, feeding, or changing them to minimize risk of injury to the child in the event you fall 3) take Prozac as prescribed 4) follow-up with [facility] as prescribed

## 2018-02-19 NOTE — ED Provider Notes (Signed)
Patient signed out to me at shift change.  Seen by neurology, who recommends MRI cervical spine for evaluation of left upper extremity weakness.  6:32 AM MRI still pending.  Patient signed out to [place], PA-C, who will continue care.   Montine Circle, PA-C 50/41/36 [id number]    Delora Fuel, MD 77/93/96 [phone]

## 2018-02-19 NOTE — BH Assessment (Signed)
Called ED to let them know that pt needs to be in a room for a TTS assessment and to notify [facility] when she is roomed. Santiago Glad says that pt will be moved in 10 minutes, so TTS will call at that time.

## 2018-02-19 NOTE — H&P (Addendum)
History and Physical    Lorraine [name] JIR:[id number] DOB: 01/15/1986 DOA: 02/18/2018  PCP: Mack Hook, MD (new pt) Consultants:  Neurohospitalists Patient coming from: Home - lives with husband ; [place]: Jamesetta Geralds [phone]  Chief Complaint: left-sided weakness  HPI: Lorraine [name] is a 32 y.o. female with medical history significant of asthma, prediabetes, anxiety/depression recently admitted to [facility] service on 5/24 for rash with chest tightness and anaphylaxis-like episode. It appears she was admitted for ACS workup which was unremarkable. Pt with frequent visits to [facility] ED as well as [facility]. She presented to [facility] ED ~24 hours ago with complaints of left-sided weakness. Symptoms began ~3 days ago and were generalized and now solely left-sided. Pt reports inability to move LUE and very limited movement and feeling to LLE. She reports discomfort in chest wall and left back as well as left leg though no sensation to touch in leg. She also reports some headache, dizziness and nausea. Pt denies recent fever, abdominal pain, vomiting or diarrhea, no recent travel.  Pt was seen at [facility] ED on 6/1 with similar complaints. Workup included there following:  -CBC/CMET unremarkable -lipase 30 -u/a negative -negative urine preg -chest xray unremarkable   ED Course: CBC and CMET unremarkable, trop poc negative, CRP 1.3. MRI brain unremarkable. MRI cspine essentially negative with minimal, noncompressive disc bulges C3-4, C5-6. Pt seen in ED by neurology who recommended both MRIs. After my exam with pt, I have requested ED call psych for any recommendations prior to admit.   Review of Systems: As per HPI; otherwise review of systems reviewed and negative.   Ambulatory Status: Ambulates without assistance at baseline  Past Medical History:  Diagnosis Date  . Asthma    Inhaler used 01/02/16  . Diverticulitis   . Nausea & vomiting 04/02/2017  . Pancreatitis   . Pre-diabetes   .  Pre-diabetes   . Pyelonephritis   . Pyelonephritis     Past Surgical History:  Procedure Laterality Date  . CESAREAN SECTION    . CESAREAN SECTION N/A 2006  . CESAREAN SECTION N/A 09/03/2014   Procedure: CESAREAN SECTION;  Surgeon: Guss Bunde, MD;  Location: [place] [facility];  Service: Obstetrics;  Laterality: N/A;  . CHOLECYSTECTOMY    . ESOPHAGOGASTRODUODENOSCOPY (EGD) WITH PROPOFOL Left 04/07/2017   Procedure: ESOPHAGOGASTRODUODENOSCOPY (EGD) WITH PROPOFOL;  Surgeon: Ronnette Juniper, MD;  Location: [place];  Service: Gastroenterology;  Laterality: Left;  . TUBAL LIGATION      Social History   Socioeconomic History  . Marital status: Married    Spouse name: Not on file  . Number of children: Not on file  . Years of education: Not on file  . Highest education level: Not on file  Occupational History  . Not on file  Social Needs  . Financial resource strain: Not on file  . Food insecurity:    Worry: Not on file    Inability: Not on file  . Transportation needs:    Medical: Not on file    Non-medical: Not on file  Tobacco Use  . Smoking status: Never Smoker  . Smokeless tobacco: Never Used  Substance and Sexual Activity  . Alcohol use: No  . Drug use: No  . Sexual activity: Yes    Birth control/protection: Surgical    Comment: tubal ligation that got untied by themselves in june 2018  Lifestyle  . Physical activity:    Days per week: Not on file    Minutes per session: Not on file  .  Stress: Not on file  Relationships  . Social connections:    Talks on phone: Not on file    Gets together: Not on file    Attends religious service: Not on file    Active member of club or organization: Not on file    Attends meetings of clubs or organizations: Not on file    Relationship status: Not on file  . Intimate partner violence:    Fear of current or ex partner: Not on file    Emotionally abused: Not on file    Physically abused: Not on file    Forced sexual activity: Not  on file  Other Topics Concern  . Not on file  Social History Narrative   ** Merged History Encounter **        Allergies  Allergen Reactions  . Morphine Rash    Family History  Problem Relation Age of Onset  . Hyperlipidemia Mother   . Diabetes Father   . Diabetes Maternal Uncle   . Diabetes Paternal Grandmother   . Asthma Daughter   . Allergic rhinitis Neg Hx   . Angioedema Neg Hx   . Eczema Neg Hx   . Immunodeficiency Neg Hx   . Urticaria Neg Hx     Prior to Admission medications   Medication Sig Start Date End Date Taking? Authorizing Provider  albuterol (PROVENTIL HFA;VENTOLIN HFA) 108 (90 Base) MCG/ACT inhaler Inhale 2 puffs into the lungs every 6 (six) hours as needed for wheezing or shortness of breath. 02/11/18  Yes Mack Hook, MD  EPINEPHrine 0.3 mg/0.3 mL IJ SOAJ injection Use as directed for severe allergic reaction 02/14/18  Yes Valentina Shaggy, MD  famotidine (PEPCID) 40 MG tablet Take 1 tablet (40 mg total) by mouth 2 (two) times daily. 02/08/18  Yes Maczis, Barth Kirks, PA-C  fluticasone furoate-vilanterol (BREO ELLIPTA) 100-25 MCG/INH AEPB Inhale 1 puff daily and brush teeth and tongue after use. 02/11/18  Yes Mack Hook, MD  montelukast (SINGULAIR) 10 MG tablet Take one tablet once at bedtime 02/14/18  Yes Valentina Shaggy, MD  ranitidine (ZANTAC) 300 MG tablet Take one tablet once daily 02/14/18  Yes Valentina Shaggy, MD  sertraline (ZOLOFT) 25 MG tablet Take 1 tablet (25 mg total) by mouth daily. 02/11/18  Yes Mack Hook, MD  cyclobenzaprine (FLEXERIL) 10 MG tablet Take 1 tablet (10 mg total) by mouth at bedtime. 02/19/18   Lawyer, Harrell Gave, PA-C  predniSONE (DELTASONE) 50 MG tablet Take 1 tablet (50 mg total) by mouth daily with breakfast. 02/19/18   Lawyer, Harrell Gave, PA-C  traMADol (ULTRAM) 50 MG tablet Take 1 tablet (50 mg total) by mouth every 6 (six) hours as needed for severe pain. 02/19/18   Dalia Heading, PA-C     Physical Exam: Vitals:   02/19/18 0317 02/19/18 0400 02/19/18 0500 02/19/18 0600  BP:  103/64 119/67 136/60  Pulse: 69 72 68 75  Resp: 14     Temp:      TempSrc:      SpO2: 97% 97% 97% 95%     General: lying supine in bed, normal affect perhaps somewhat flat, but may be language barrier. Eyes: PERRL, EOMI, normal lids, iris ENT: grossly normal hearing, lips & tongue, mmm; appropriate dentition Neck: no LAD, masses or thyromegaly; no carotid bruits Cardiovascular: RRR, no m/r/g. No LE edema.  Respiratory:  CTA bilaterally with no wheezes/rales/rhonchi.  Normal respiratory effort. Abdomen: obese, soft, NT, ND, NABS Back:  normal alignment, no CVAT Skin:  no rash or induration seen on limited exam Musculoskeletal: no bony abnormality Lower extremity: No LE edema.  2+ distal pulses. Psychiatric: grossly normal mood and affect, speech fluent and appropriate, AOx3 Neurologic: no aphasia or dysarthria, follows commands. No facial asymmetry. PERRLA, EOMI, tongue is midline, unable to shoulder shrug on left. LUE strength 0/5 otherwise normal. No sensation upon light touch to LLE otherwise normal. DTR's 2+ and symmetric throughout  Radiological Exams on Admission: Mr Jeri Cos Wo Contrast  Result Date: 02/18/2018 CLINICAL DATA:  Left-sided pain.  Headache EXAM: MRI HEAD WITHOUT AND WITH CONTRAST TECHNIQUE: Multiplanar, multiecho pulse sequences of the brain and surrounding structures were obtained without and with intravenous contrast. CONTRAST:  68mL MULTIHANCE GADOBENATE DIMEGLUMINE 529 MG/ML IV SOLN COMPARISON:  Head CT 02/17/2018 FINDINGS: BRAIN: Partially empty sella. There is no acute infarct or acute hemorrhage. There is no mass lesion or other mass effect. There is no hydrocephalus, dural abnormality or extra-axial collection. The white matter signal is normal for the patient's age. No age-advanced or lobar predominant atrophy. No chronic microhemorrhage or superficial siderosis. No  abnormal contrast enhancement. VASCULAR: Major intracranial arterial and venous sinus flow voids are preserved. SKULL AND UPPER CERVICAL SPINE: The visualized skull base, calvarium, upper cervical spine and extracranial soft tissues are normal. SINUSES/ORBITS: No fluid levels or advanced mucosal thickening. No mastoid or middle ear effusion. The orbits are normal. IMPRESSION: Normal MRI of the brain. Electronically Signed   By: Ulyses Jarred M.D.   On: 02/18/2018 22:44   Mr Cervical Spine Wo Contrast  Result Date: 02/19/2018 CLINICAL DATA:  Left-sided numbness and weakness over the last 3 days. Left arm pain. EXAM: MRI CERVICAL SPINE WITHOUT CONTRAST TECHNIQUE: Multiplanar, multisequence MR imaging of the cervical spine was performed. No intravenous contrast was administered. COMPARISON:  None. FINDINGS: Alignment: Normal Vertebrae: Normal Cord: Normal.  No cord compression or primary cord lesion. Posterior Fossa, vertebral arteries, paraspinal tissues: Arachnoid herniation into the sella. This can be a normal finding or can be seen with intracranial hypertension. Disc levels: No significant disc pathology. No canal or foraminal stenosis. Minimal, non-compressive disc bulges at C3-4, C4-5 and C5-6. No facet arthropathy. IMPRESSION: Essentially negative study of the cervical spine. Minimal, non-compressive disc bulges from C3-4 through C5-6. No canal or foraminal stenosis. No evidence of facet arthropathy. Electronically Signed   By: Nelson Chimes M.D.   On: 02/19/2018 09:09     Labs on Admission: I have personally reviewed the available labs and imaging studies at the time of the admission.  Pertinent labs:  CBC, CMET unremarkable CRP unremarkable    Assessment/Plan Principal Problem:   Left-sided weakness Active Problems:   Asthma   Pre-diabetes   Left-sided weakness -unclear etiology at this point. No evidence of CVA with negative MRI brain. Symptoms ongoing x 3 days and very atypical for  stroke given it started as generalized and at some point became all left-sided. Pt uncertain of when this occurred. cspine MRI does not show cause of symptoms. On my exam pt has excellent reflexs and normal tone. Neuro has recommended PT/OT. Consider conversion disorder based on hx and exam. Will ask psych to evaluate prior to proceeding with admission to determine their recs as not sure there is much to offer her at [facility].  -no metabolic derangements, will check TSH  Anxiety/depression -started on Zoloft during recent admit -Continue for now. Await psych recs  Ashtma -stable -continue home meds   DVT prophylaxis:  SCDs Code Status: Full -  confirmed with patient/family Family Communication: husband at bedside on exam Disposition Plan: pending Consults called: neurology, psych  Admission status: obs  Patrici Ranks, NP-C [facility] [place]  pgr [phone]   If note is complete, please contact covering daytime or nighttime physician. [email or web address] Password [email or web address]  02/19/2018, 4:07 PM

## 2018-02-19 NOTE — ED Notes (Signed)
Breakfast tray ordered 

## 2018-02-19 NOTE — ED Provider Notes (Signed)
4:51 PM Handoff to me. Pt in ED after being seen for weakness, inability to walk. Neuro has seen. MRI's have been negative. [facility] involved. Plan is for hospitalist admit pending preceding psych eval. Note in computer.   TTS consult pending.   BP 136/60   Pulse 75   Temp 98.1 F (36.7 C) (Oral)   Resp 14   LMP 01/20/2018   SpO2 95%    5:40 PM TTS has cleared from psych standpoint.   Hospitalist to admit patient.     Carlisle Cater, PA-C 02/19/18 1740    Sherwood Gambler, MD 02/24/18 [phone]

## 2018-02-20 ENCOUNTER — Other Ambulatory Visit: Payer: Self-pay

## 2018-02-20 ENCOUNTER — Telehealth: Payer: Self-pay | Admitting: *Deleted

## 2018-02-20 DIAGNOSIS — M5412 Radiculopathy, cervical region: Secondary | ICD-10-CM

## 2018-02-20 DIAGNOSIS — R29898 Other symptoms and signs involving the musculoskeletal system: Secondary | ICD-10-CM

## 2018-02-20 DIAGNOSIS — J45909 Unspecified asthma, uncomplicated: Secondary | ICD-10-CM

## 2018-02-20 LAB — URINALYSIS, ROUTINE W REFLEX MICROSCOPIC
BILIRUBIN URINE: NEGATIVE
Glucose, UA: NEGATIVE mg/dL
KETONES UR: 5 mg/dL — AB
Nitrite: NEGATIVE
Protein, ur: 100 mg/dL — AB
SPECIFIC GRAVITY, URINE: 1.024 (ref 1.005–1.030)
pH: 6 (ref 5.0–8.0)

## 2018-02-20 MED ORDER — ZOLPIDEM TARTRATE 5 MG PO TABS
5.0000 mg | ORAL_TABLET | Freq: Once | ORAL | Status: AC
  Administered 2018-02-20: 5 mg via ORAL
  Filled 2018-02-20: qty 1

## 2018-02-20 NOTE — Plan of Care (Signed)
Neuro: Pt A&O X4 and able to follow all commands at this time. Neuro exam WNL. Will continue to monitor.    Respiratory: Pt remains on RA with no SOB or DOE at this time. Pt lungs clear throughout.     Cardiovascular: Pts BP WNL and pt remains afebrile.    GI/GU: Pt voiding in commode with adequate urine output. Pt tolerating PO intake with good PO appetite and tolerating all meds and meals at this time.     Skin: Skin intact with no s/s of skin breakdown at this time. Pt able to reposition independently and sat most of the day in chair.   Pain: Pt reporting pain in left side of head that is radiating to neck. Pt received tylenol and reported adequate relief as evidenced by a 0/10 on pain scalel after intervention.    Events: NO acute events throughout shift. Pts plan of care to continue with current regimen, Family updated and no further questions at this time.

## 2018-02-20 NOTE — Progress Notes (Signed)
NEURO HOSPITALIST PROGRESS NOTE    Subjective: Patient awake, alert in bathroom, NAD. Patient spanish speaking only; husband assisted with translation. Patient complained of pain in neck on left side and left vision changes.   Exam: Vitals:   02/20/18 0638 02/20/18 0818  BP: 105/61   Pulse: 66   Resp: 16   Temp: 98 F (36.7 C)   SpO2: 98% 98%    Physical Exam  HEENT-  Normocephalic, no lesions, without obvious abnormality.  Normal external eye and conjunctiva.   Cardiovascular-  pulses palpable throughout   Lungs- no excessive working breathing.  Saturations within normal limits Extremities- Warm, dry and intact Musculoskeletal-no joint tenderness, deformity or swelling Skin-warm and dry, no hyperpigmentation, vitiligo, or suspicious lesions  Neuro:  Mental Status: Alert, oriented    Able to follow all commands (Spanish interpretation provided by husband). Cranial Nerves: II: Inconsistent visual fields on the left when focused on by examiner, but responds to left sided visual stimuli when visual fields are not being formally tested. Ptosis not present. EOMI. PERRL. Smile symmetric. To tuning fork, she splits at midline when testing forehead, nose, and clavicle vibration sensation, consistent with embellishment. Hearing intact to voice. Palate rises symmetrically. Midline tongue extension. Left shoulder shrug command results in no movement; however, the patient is able to turn her head to right with full strength, which requires activation of the LEFT sternocleidomastoid. Conversely, she apparently could not turn head to the left which suggests embellishment; despite apparent weakness of left SCM, the patient generally keeps her head in midline position without apparent asymmetry of the neck muscles and is able to turn her head to the left when distracted. Motor: Right : Upper extremity   5/5    Left:     Upper extremity   0/5. Does not move to noxious stimuli.     Lower extremity   5/5     Lower extremity   2/5 --of note: + Hoover's sign when tested by both NP and attending on separate trials: When lifting right thigh she did push down with left thigh (left is the subjectively weak side) Tone and bulk: No atrophy noted Sensory: no sensation left face and left arm; decreased sensation LLE  Deep Tendon Reflexes: 2+ and symmetric throughout. Positive Hoffman's sign bilateral hands Plantars: Right: downgoing   Left: downgoing Cerebellar: normal finger-to-nose on right, not able to do on left,  Gait: Attempted, patient not able to walk, but could hold herself up, drag left leg along floor and was able to do a quarter squat without problem. Able to weight-bear on LLE, which is incongruent with motor testing above.    Medications:  Scheduled: . famotidine  40 mg Oral BID  . fluticasone furoate-vilanterol  1 puff Inhalation Daily  . montelukast  10 mg Oral QHS  . sertraline  25 mg Oral Daily  . sodium chloride flush  3 mL Intravenous Q12H   Continuous: . sodium chloride     SWN:IOEVOJ chloride, acetaminophen **OR** acetaminophen, albuterol, sodium chloride flush  Pertinent Labs/Diagnostics:   Mr Jeri Cos Wo Contrast  Result Date: 02/18/2018 CLINICAL DATA:  Left-sided pain.  Headache EXAM: MRI HEAD WITHOUT AND WITH CONTRAST TECHNIQUE: Multiplanar, multiecho pulse sequences of the brain and surrounding structures were obtained without and with intravenous contrast. CONTRAST:  28mL MULTIHANCE GADOBENATE DIMEGLUMINE 529 MG/ML IV SOLN COMPARISON:  Head CT 02/17/2018  FINDINGS: BRAIN: Partially empty sella. There is no acute infarct or acute hemorrhage. There is no mass lesion or other mass effect. There is no hydrocephalus, dural abnormality or extra-axial collection. The white matter signal is normal for the patient's age. No age-advanced or lobar predominant atrophy. No chronic microhemorrhage or superficial siderosis. No abnormal contrast enhancement.  VASCULAR: Major intracranial arterial and venous sinus flow voids are preserved. SKULL AND UPPER CERVICAL SPINE: The visualized skull base, calvarium, upper cervical spine and extracranial soft tissues are normal. SINUSES/ORBITS: No fluid levels or advanced mucosal thickening. No mastoid or middle ear effusion. The orbits are normal. IMPRESSION: Normal MRI of the brain. Electronically Signed   By: Ulyses Jarred M.D.   On: 02/18/2018 22:44   Mr Cervical Spine Wo Contrast  Result Date: 02/19/2018 CLINICAL DATA:  Left-sided numbness and weakness over the last 3 days. Left arm pain. EXAM: MRI CERVICAL SPINE WITHOUT CONTRAST TECHNIQUE: Multiplanar, multisequence MR imaging of the cervical spine was performed. No intravenous contrast was administered. COMPARISON:  None. FINDINGS: Alignment: Normal Vertebrae: Normal Cord: Normal.  No cord compression or primary cord lesion. Posterior Fossa, vertebral arteries, paraspinal tissues: Arachnoid herniation into the sella. This can be a normal finding or can be seen with intracranial hypertension. Disc levels: No significant disc pathology. No canal or foraminal stenosis. Minimal, non-compressive disc bulges at C3-4, C4-5 and C5-6. No facet arthropathy. IMPRESSION: Essentially negative study of the cervical spine. Minimal, non-compressive disc bulges from C3-4 through C5-6. No canal or foraminal stenosis. No evidence of facet arthropathy. Electronically Signed   By: Nelson Chimes M.D.   On: 02/19/2018 09:09    Assessment:  32 year old female with plegic LUE and paretic LLE, insensate on the left. Symptoms evolved from diffuse weakness to left sided weakness at some time during the past 3 days, with patient unable to specify further. Onset of first symptoms 3 days PTA.  Exam findings are difficult to localize other than most likely supratentorial on the right. May be functional/conversion disorder given atypical presentation and affect not congruent with the degree of  disability endorsed by the patient. Reflexes were symmetric and tone was normal bilaterally. There was some inconsistency in the degree of strength present on LLE exam. No facial droop which would be unexpected given presence of both LUE and LLE weakness.  --No hyporeflexia of LUE to suggest a brachial plexopathy or polyradiculopathy. No hyperreflexia to suggest involvement of the motor pathways by a spinal cord or brain lesion.  Nerve roots and spinal cord were normal on MRI of cervical spine. MRI brain also normal. --Multiple inconsistencies on exam suggest embellishment due to conversion disorder, factitious disorder or malingering. No consistently localizable findings noted.   Recommendations: 1. EMG/NCS of LUE can be considered as outpatient 2. Consider MRI of the left brachial plexus with contrast, although this is expected to be low-yield   Laurey Morale, MSN, NP-C [facility] [phone]   Assessment and recommendations discussed with Neurology team.  Electronically signed: Dr. Kerney Elbe 02/20/2018, 11:50 AM

## 2018-02-20 NOTE — Telephone Encounter (Signed)
As I told them last Friday, labs could take up to 2 weeks to return.  This is especially true for the urine test, which often take a month.  Salvatore Marvel, MD [facility]

## 2018-02-20 NOTE — Progress Notes (Signed)
Occupational Therapy Evaluation Patient Details Name: Lorraine [name] MRN: [id number] DOB: 21-Nov-1985 Today's Date: 02/20/2018    History of Present Illness 32 year old female comes in for 2-3 day history of left sided numbness/weakness. Pt states that she has been in and out of the hospital with problems with allergies, and she began having problems feeling numb after getting shots at a clinic. (over 11 ED visits in 6 mo).Per counselor note, Pt admits to feeling depressed about her relationship with her teen aged daughter recently and was recently started on Zoloft    Clinical Impression   PTA, pt lived at home with her husband and 3 children, did not work and enjoyed reading. Tele Interpreter # [phone] Elita Quick) used throughout session with husband present. Pt with apparent dense L sided sensory motor deficits and reports horizontal diplopia at times in both L and R fields although appears to demonstrate a conjugate gaze. On occulomotor testing, pt unable to gaze to L visual field, although does not have difficulty scanning environment during functional tasks involving L field. Pt does not identify visual stimuli (flashes of light) in L field during field testing,Pt required Min A +2 for sit - stand but was unable to step due to B knees buckling. Transferred to chair with use of Stedy. Pt overall min A for UB ADL and mod A for LB ADL. Poor eye contact and flat affect  throughout session. Given patient's current level of performance and functional decline, recommend rehab at CIR. Will follow acutely to address established goals and facilitate DC to next venue of care.      Follow Up Recommendations  CIR;Supervision/Assistance - 24 hour    Equipment Recommendations  3 in 1 bedside commode;Tub/shower bench;Wheelchair (measurements OT);Wheelchair cushion (measurements OT)    Recommendations for Other Services Rehab consult     Precautions / Restrictions Precautions Precautions: Fall       Mobility Bed Mobility Overal bed mobility: Needs Assistance Bed Mobility: Supine to Sit     Supine to sit: Mod assist     General bed mobility comments: Increased time;Heavy use of rails; vc tomove BLE off bed; Pt able to move to sidelying then BLE off bed usingRUE to assist;Pushing up through RUE, LUE appeared to remain flaccid  Transfers Overall transfer level: Needs assistance Equipment used: Rolling walker (2 wheeled);2 person hand held assist Transfers: Sit to/from Stand Sit to Stand: Min assist;+2 safety/equipment         General transfer comment: L knee buckling when offloading RLE; buckling of R knee also observed    Balance Overall balance assessment: Needs assistance   Sitting balance-Leahy Scale: Good       Standing balance-Leahy Scale: Poor Standing balance comment: reliant on external support                           ADL either performed or assessed with clinical judgement   ADL Overall ADL's : Needs assistance/impaired Eating/Feeding: Set up   Grooming: Minimal assistance;Sitting   Upper Body Bathing: Minimal assistance;Sitting   Lower Body Bathing: Moderate assistance;Sit to/from stand   Upper Body Dressing : Moderate assistance;Sitting   Lower Body Dressing: Moderate assistance;Sit to/from stand   Toilet Transfer: Minimal assistance;+2 for physical assistance(simulated to chair with use of Stedy; Unable to step)     Toileting - Clothing Manipulation Details (indicate cue type and reason): offered to comlete pericare adn to toilet. Pt declined. NT states she has not asked to  go to the bathroom today and staes the "doctor" helped her.     Functional mobility during ADLs: +2 for physical assistance;Moderate assistance(without use of Stedy)       Vision Baseline Vision/History: Wears glasses Wears Glasses: At all times Patient Visual Report: Diplopia;Blurring of vision Vision Assessment?: Yes Eye Alignment: Within Functional  Limits Ocular Range of Motion: Restricted on the left Alignment/Gaze Preference: Within Defined Limits Tracking/Visual Pursuits: Left eye does not track laterally;Right eye does not track medially Saccades: Decreased speed of saccadic movement;Impaired - to be further tested in functional context Visual Fields: Left visual field deficit Diplopia Assessment: Objects split side to side Depth Perception: (Does not appear to have impaired depth perception) Additional Comments: inconsistently scanning to L. Ptdemonstrates  a "hard stop" at midline, but occasionally scans to L; reports diplopia inL vision near, notin L vision distand, R vision both nearadn far; Pt is not closing 1 eye to try to compensate     Perception Perception Comments: Pt demonstrating L visual field cut; notsuing L UE;    Praxis      Pertinent Vitals/Pain Pain Assessment: Faces Faces Pain Scale: Hurts a little bit Pain Location: back Pain Descriptors / Indicators: Discomfort Pain Intervention(s): Limited activity within patient's tolerance     Hand Dominance Right   Extremity/Trunk Assessment Upper Extremity Assessment Upper Extremity Assessment: LUE deficits/detail LUE Deficits / Details: flaccid LUE; does not try to catch arm when dropped however,able to sustain position of arm on Stedy when mobilizing ; nonfunctional at this time; ? bruising on Larm LUE Sensation: decreased light touch LUE Coordination: decreased fine motor;decreased gross motor   Lower Extremity Assessment Lower Extremity Assessment: Defer to PT evaluation   Cervical / Trunk Assessment Cervical / Trunk Assessment: Normal(Able to sustain goodmidline postural control)   Communication Communication Communication: Prefers language other than English(spainish)   Cognition Arousal/Alertness: Awake/alert Behavior During Therapy: Flat affect(pooreye contact; ) Overall Cognitive Status: Difficult to assess                                  General Comments: Appears WFL; following multipstepcommands; interactingwith interpreter approrpiately adn answering quesitons appropirately; A & O x 3.    General Comments  poor contact with husband throughout session; pt appears flat and withdrawn, "likes to read"    Exercises     Shoulder Instructions      Home Living Family/patient expects to be discharged to:: Private residence Living Arrangements: Spouse/significant other Available Help at Discharge: Available 24 hours/day Type of Home: House Home Access: Stairs to enter [employer] of Steps: 1 Entrance Stairs-Rails: None Home Layout: One level     Bathroom Shower/Tub: Corporate investment banker: Standard Bathroom Accessibility: Yes How Accessible: Accessible via walker Home Equipment: None          Prior Functioning/Environment Level of Independence: Independent        Comments: has3 children        OT Problem List: Decreased strength;Decreased range of motion;Decreased activity tolerance;Impaired balance (sitting and/or standing);Impaired vision/perception;Decreased coordination;Decreased safety awareness;Decreased knowledge of use of DME or AE;Impaired sensation;Impaired tone;Impaired UE functional use;Pain      OT Treatment/Interventions: Self-care/ADL training;Therapeutic exercise;Neuromuscular education;DME and/or AE instruction;Therapeutic activities;Visual/perceptual remediation/compensation;Patient/family education;Balance training    OT Goals(Current goals can be found in the care plan section) Acute Rehab OT Goals Patient Stated Goal: per husband to get better OT Goal Formulation: With patient Time For  Goal Achievement: 03/06/18 Potential to Achieve Goals: Good  OT Frequency: Min 2X/week   Barriers to D/C:            Co-evaluation PT/OT/SLP Co-Evaluation/Treatment: Yes Reason for Co-Treatment: Complexity of the patient's impairments (multi-system  involvement);For patient/therapist safety;To address functional/ADL transfers   OT goals addressed during session: ADL's and self-care      AM-PAC PT "6 Clicks" Daily Activity     Outcome Measure Help from another person eating meals?: A Little Help from another person taking care of personal grooming?: A Little Help from another person toileting, which includes using toliet, bedpan, or urinal?: A Lot Help from another person bathing (including washing, rinsing, drying)?: A Lot Help from another person to put on and taking off regular upper body clothing?: A Lot Help from another person to put on and taking off regular lower body clothing?: A Lot 6 Click Score: 14   End of Session Equipment Utilized During Treatment: Gait belt;Rolling walker;Back brace Nurse Communication: Mobility status  Activity Tolerance: Patient tolerated treatment well Patient left: in chair;with call bell/phone within reach  OT Visit Diagnosis: Other abnormalities of gait and mobility (R26.89);Muscle weakness (generalized) (M62.81);Low vision, both eyes (H54.2);Hemiplegia and hemiparesis;Pain Hemiplegia - Right/Left: Left Hemiplegia - dominant/non-dominant: Non-Dominant Hemiplegia - caused by: Unspecified Pain - part of body: (back)                Time: 1150-1240 OT Time Calculation (min): 50 min Charges:  OT General Charges $OT Visit: 1 Visit OT Evaluation $OT Eval Moderate Complexity: 1 Mod OT Treatments $Self Care/Home Management : 8-22 mins G-Codes:     Maurie Boettcher, OT/L  OT Clinical Specialist [phone]   [facility] 02/20/2018, 2:36 PM

## 2018-02-20 NOTE — Evaluation (Addendum)
Physical Therapy Evaluation Patient Details Name: Lorraine [name] MRN: [id number] DOB: [birth year] Today's Date: 02/20/2018   History of Present Illness  32 year old female comes in for 2-3 day history of left sided numbness/weakness. Pt states that she has been in and out of the hospital with problems with allergies, and she began having problems feeling numb after getting shots at a clinic. (over 11 ED visits in 6 mo).Per counselor note, Pt admits to feeling depressed about her relationship with her teen aged daughter recently and was recently started on Zoloft   Clinical Impression  Interpreter [phone] used throughout session. At baseline, patient is independent and lives at home with her husband and 3 children. Patient with significantly decreased functional mobility secondary to apparent left sided flaccidity (both upper and lower extremity), sensory impairment, decreased balance, and also reports horizontal diplopia in both right and left fields. Requiring minA + 2 for transfers, but unable to initiate ambulation due to left knee buckle when weight shifting. However, was able to statically stand without difficulty. Denna Haggard utilized to transfer from bed to chair. Of note, patient has positive Hoover's sign and muscle testing is incongruent with functional mobility examination. Highly recommend CIR to maximize functional independence and decrease caregiver burden. Will continue to follow acutely to progress mobility.   Follow Up Recommendations CIR;Supervision/Assistance - 24 hour    Equipment Recommendations  Other (comment)(TBD)    Recommendations for Other Services Rehab consult     Precautions / Restrictions Precautions Precautions: Fall Restrictions Weight Bearing Restrictions: No      Mobility  Bed Mobility Overal bed mobility: Needs Assistance Bed Mobility: Supine to Sit     Supine to sit: Mod assist     General bed mobility comments: Increased time;Heavy use of  rails; vc to move BLE off bed; Pt able to move to sidelying then BLE off bed using RUE to assist;Pushing up through RUE, LUE appeared to remain flaccid  Transfers Overall transfer level: Needs assistance Equipment used: Rolling walker (2 wheeled);2 person hand held assist Transfers: Sit to/from Stand Sit to Stand: Min assist;+2 safety/equipment         General transfer comment: L knee buckling when offloading RLE; buckling of R knee also observed. Utilized Denna Haggard for transfer from bed to chair.  Ambulation/Gait                Stairs            Wheelchair Mobility    Modified Rankin (Stroke Patients Only)       Balance Overall balance assessment: Needs assistance   Sitting balance-Leahy Scale: Good       Standing balance-Leahy Scale: Poor Standing balance comment: reliant on external support                             Pertinent Vitals/Pain Pain Assessment: Faces Faces Pain Scale: Hurts a little bit Pain Location: back Pain Descriptors / Indicators: Discomfort Pain Intervention(s): Monitored during session    Home Living Family/patient expects to be discharged to:: Private residence Living Arrangements: Spouse/significant other Available Help at Discharge: Available 24 hours/day Type of Home: House Home Access: Stairs to enter Entrance Stairs-Rails: None Entrance Stairs-Number of Steps: 1 Home Layout: One level Home Equipment: None      Prior Function Level of Independence: Independent         Comments: has3 children     Hand Dominance   Dominant Hand: Right  Extremity/Trunk Assessment   Upper Extremity Assessment Upper Extremity Assessment: LUE deficits/detail LUE Deficits / Details: flaccid LUE; does not try to catch arm when dropped however,able to sustain position of arm on Stedy when mobilizing ; nonfunctional at this time; ? bruising on Larm LUE Sensation: decreased light touch LUE Coordination: decreased fine  motor;decreased gross motor    Lower Extremity Assessment Lower Extremity Assessment: RLE deficits/detail;LLE deficits/detail RLE Deficits / Details: MMT: hip flexion, knee extension/flexion, ankle dorsiflexion/plantarflexion 5/5 LLE Deficits / Details: flaccid LLE    Cervical / Trunk Assessment Cervical / Trunk Assessment: Normal(Able to sustain good midline postural control)  Communication   Communication: Prefers language other than English(spanish)  Cognition Arousal/Alertness: Awake/alert Behavior During Therapy: Flat affect Overall Cognitive Status: Difficult to assess                                 General Comments: Appears WFL; following multi step commands; interactingwith interpreter approrpiately adn answering quesitons appropirately; A & O x 3.       General Comments      Exercises     Assessment/Plan    PT Assessment Patient needs continued PT services  PT Problem List Decreased strength;Decreased activity tolerance;Decreased range of motion;Decreased balance;Decreased mobility       PT Treatment Interventions DME instruction;Gait training;Functional mobility training;Therapeutic activities;Therapeutic exercise;Balance training;Neuromuscular re-education;Patient/family education    PT Goals (Current goals can be found in the Care Plan section)  Acute Rehab PT Goals Patient Stated Goal: per husband to get better PT Goal Formulation: With patient/family Time For Goal Achievement: 03/06/18 Potential to Achieve Goals: Fair    Frequency Min 3X/week   Barriers to discharge        Co-evaluation PT/OT/SLP Co-Evaluation/Treatment: Yes Reason for Co-Treatment: Complexity of the patient's impairments (multi-system involvement);For patient/therapist safety;To address functional/ADL transfers PT goals addressed during session: Mobility/safety with mobility         AM-PAC PT "6 Clicks" Daily Activity  Outcome Measure Difficulty turning over in  bed (including adjusting bedclothes, sheets and blankets)?: A Lot Difficulty moving from lying on back to sitting on the side of the bed? : Unable Difficulty sitting down on and standing up from a chair with arms (e.g., wheelchair, bedside commode, etc,.)?: Unable Help needed moving to and from a bed to chair (including a wheelchair)?: A Lot Help needed walking in hospital room?: Total Help needed climbing 3-5 steps with a railing? : Total 6 Click Score: 8    End of Session Equipment Utilized During Treatment: Gait belt Activity Tolerance: Patient limited by fatigue Patient left: in chair;with call bell/phone within reach;with chair alarm set;with family/visitor present Nurse Communication: Mobility status PT Visit Diagnosis: Unsteadiness on feet (R26.81);Other abnormalities of gait and mobility (R26.89);Difficulty in walking, not elsewhere classified (R26.2)    Time: 1751-0258 PT Time Calculation (min) (ACUTE ONLY): 27 min   Charges:   PT Evaluation $PT Eval Moderate Complexity: 1 Mod     PT G Codes:       Ellamae Sia, PT, DPT Acute Rehabilitation Services  Pager: Theresa 02/20/2018, 4:26 PM

## 2018-02-20 NOTE — Progress Notes (Signed)
Patient ID: Lorraine [name], female   DOB: 03/26/1986, 32 y.o.   MRN: [id number]  PROGRESS NOTE    Aricka Goldberger  WUJ:[id number] DOB: October 25, 1985 DOA: 02/18/2018 PCP: Patient, No Pcp Per   Brief Narrative: 32 year old female presented with left-sided weakness concerning for stroke versus conversion disorder.  Neurology consulted.   Assessment & Plan:   Principal Problem:   Left-sided weakness Active Problems:   Asthma   Pre-diabetes   Weakness   Left-sided weakness-work-up per neurology team.  MRI brain has been negative.  Tele psychiatry is seen the patient is already signed off on patient recommending outpatient work-up.  Patient still cannot move her left side today.  Physical therapy evaluation pending.  Further work-up per neuro team.  Asthma-stable   DVT prophylaxis: SCDs  Code Status: Full  Family Communication: Has been  Disposition Plan: Per neuro team    Consultants:   Neurology   Subjective: Patient still reporting she cannot move left side   Objective: Vitals:   02/19/18 2017 02/20/18 0638 02/20/18 0818 02/20/18 1334  BP: 116/66 105/61  107/68  Pulse: 75 66  75  Resp: 12 16  18   Temp: 99 F (37.2 C) 98 F (36.7 C)  98.6 F (37 C)  TempSrc: Oral Oral  Oral  SpO2: 99% 98% 98% 94%    Intake/Output Summary (Last 24 hours) at 02/20/2018 1545 Last data filed at 02/20/2018 1300 Gross per 24 hour  Intake 680 ml  Output 500 ml  Net 180 ml   There were no vitals filed for this visit.  Examination:  General exam: Appears calm and comfortable  Respiratory system: Clear to auscultation. Respiratory effort normal. Cardiovascular system: S1 & S2 heard, RRR. No JVD, murmurs, rubs, gallops or clicks. No pedal edema. Gastrointestinal system: Abdomen is nondistended, soft and nontender. No organomegaly or masses felt. Normal bowel sounds heard. Central nervous system: Alert and oriented. No focal neurological deficits. Extremities: 5 out of 5 strength right  upper and lower extremity 0 out of 5 strength left upper and lower extremity patient has full range of motion of her neck cranial nerves II through XII grossly intact  skin: No rashes, lesions or ulcers Psychiatry: Judgement and insight appear normal. Mood & affect depressed    Data Reviewed: I have personally reviewed following labs and imaging studies  CBC: Recent Labs  Lab 02/17/18 0822 02/17/18 0833  WBC 10.1  --   NEUTROABS 4.8  --   HGB 12.8 13.3  HCT 39.6 39.0  MCV 91.2  --   PLT 298  --    Basic Metabolic Panel: Recent Labs  Lab 02/17/18 0822 02/17/18 0833  NA 137 138  K 3.7 3.7  CL 105 104  CO2 24  --   GLUCOSE 105* 104*  BUN 16 16  CREATININE 0.76 0.60  CALCIUM 8.8*  --    GFR: Estimated Creatinine Clearance: 96.3 mL/min (by C-G formula based on SCr of 0.6 mg/dL). Liver Function Tests: Recent Labs  Lab 02/17/18 0822  AST 18  ALT 21  ALKPHOS 82  BILITOT 0.6  PROT 6.5  ALBUMIN 3.6   No results for input(s): LIPASE, AMYLASE in the last 168 hours. No results for input(s): AMMONIA in the last 168 hours. Coagulation Profile: Recent Labs  Lab 02/17/18 0822  INR 0.93   Cardiac Enzymes: No results for input(s): CKTOTAL, CKMB, CKMBINDEX, TROPONINI in the last 168 hours. BNP (last 3 results) No results for input(s): PROBNP in the last 8760 hours. HbA1C:  No results for input(s): HGBA1C in the last 72 hours. CBG: Recent Labs  Lab 02/17/18 0817  GLUCAP 104*   Lipid Profile: No results for input(s): CHOL, HDL, LDLCALC, TRIG, CHOLHDL, LDLDIRECT in the last 72 hours. Thyroid Function Tests: Recent Labs    02/19/18 2113  TSH 0.583   Anemia Panel: No results for input(s): VITAMINB12, FOLATE, FERRITIN, TIBC, IRON, RETICCTPCT in the last 72 hours. Sepsis Labs: No results for input(s): PROCALCITON, LATICACIDVEN in the last 168 hours.  No results found for this or any previous visit (from the past 240 hour(s)).       Radiology Studies: Mr  Jeri Cos XT Contrast  Result Date: 02/18/2018 CLINICAL DATA:  Left-sided pain.  Headache EXAM: MRI HEAD WITHOUT AND WITH CONTRAST TECHNIQUE: Multiplanar, multiecho pulse sequences of the brain and surrounding structures were obtained without and with intravenous contrast. CONTRAST:  80mL MULTIHANCE GADOBENATE DIMEGLUMINE 529 MG/ML IV SOLN COMPARISON:  Head CT 02/17/2018 FINDINGS: BRAIN: Partially empty sella. There is no acute infarct or acute hemorrhage. There is no mass lesion or other mass effect. There is no hydrocephalus, dural abnormality or extra-axial collection. The white matter signal is normal for the patient's age. No age-advanced or lobar predominant atrophy. No chronic microhemorrhage or superficial siderosis. No abnormal contrast enhancement. VASCULAR: Major intracranial arterial and venous sinus flow voids are preserved. SKULL AND UPPER CERVICAL SPINE: The visualized skull base, calvarium, upper cervical spine and extracranial soft tissues are normal. SINUSES/ORBITS: No fluid levels or advanced mucosal thickening. No mastoid or middle ear effusion. The orbits are normal. IMPRESSION: Normal MRI of the brain. Electronically Signed   By: Ulyses Jarred M.D.   On: 02/18/2018 22:44   Mr Cervical Spine Wo Contrast  Result Date: 02/19/2018 CLINICAL DATA:  Left-sided numbness and weakness over the last 3 days. Left arm pain. EXAM: MRI CERVICAL SPINE WITHOUT CONTRAST TECHNIQUE: Multiplanar, multisequence MR imaging of the cervical spine was performed. No intravenous contrast was administered. COMPARISON:  None. FINDINGS: Alignment: Normal Vertebrae: Normal Cord: Normal.  No cord compression or primary cord lesion. Posterior Fossa, vertebral arteries, paraspinal tissues: Arachnoid herniation into the sella. This can be a normal finding or can be seen with intracranial hypertension. Disc levels: No significant disc pathology. No canal or foraminal stenosis. Minimal, non-compressive disc bulges at C3-4, C4-5  and C5-6. No facet arthropathy. IMPRESSION: Essentially negative study of the cervical spine. Minimal, non-compressive disc bulges from C3-4 through C5-6. No canal or foraminal stenosis. No evidence of facet arthropathy. Electronically Signed   By: Nelson Chimes M.D.   On: 02/19/2018 09:09        Scheduled Meds: . famotidine  40 mg Oral BID  . fluticasone furoate-vilanterol  1 puff Inhalation Daily  . montelukast  10 mg Oral QHS  . sertraline  25 mg Oral Daily  . sodium chloride flush  3 mL Intravenous Q12H   Continuous Infusions: . sodium chloride       LOS: 0 days    Time spent: 20 minutes   Taiwan Millon A, MD [facility] Pager [phone]  If 7PM-7AM, please contact night-coverage [email or web address] Password [facility] 02/20/2018, 3:45 PM

## 2018-02-20 NOTE — Telephone Encounter (Signed)
Husband came in clinic but writer was with a patient. Writer did call patient's husband he wants to know if lab results are back. Advised results are back Dr Ernst Bowler please advise when results are back

## 2018-02-20 NOTE — Progress Notes (Signed)
Rehab Admissions Coordinator Note:  Patient was screened by Cleatrice Burke for appropriateness for an Inpatient Acute Rehab Consult per OT recommendation.At this time, we are recommending await further medical work up for clear diagnosis. No diagnosis for inpatient rehab admit at this time.Cleatrice Burke 02/20/2018, 2:57 PM  I can be reached at [phone].

## 2018-02-21 ENCOUNTER — Ambulatory Visit: Payer: Self-pay | Admitting: Allergy

## 2018-02-21 DIAGNOSIS — R7303 Prediabetes: Secondary | ICD-10-CM

## 2018-02-21 MED ORDER — HYDROCODONE-ACETAMINOPHEN 5-325 MG PO TABS
1.0000 | ORAL_TABLET | Freq: Four times a day (QID) | ORAL | Status: DC | PRN
  Administered 2018-02-21: 2 via ORAL
  Administered 2018-02-22 – 2018-02-23 (×5): 1 via ORAL
  Administered 2018-02-24 – 2018-02-25 (×4): 2 via ORAL
  Filled 2018-02-21: qty 2
  Filled 2018-02-21: qty 1
  Filled 2018-02-21 (×4): qty 2
  Filled 2018-02-21: qty 1
  Filled 2018-02-21: qty 2
  Filled 2018-02-21 (×2): qty 1

## 2018-02-21 NOTE — Progress Notes (Signed)
Physical Therapy Treatment Patient Details Name: Lorraine [name] MRN: [id number] DOB: 1986-04-23 Today's Date: 02/21/2018    History of Present Illness 32 year old female comes in for 2-3 day history of left sided numbness/weakness. Pt states that she has been in and out of the hospital with problems with allergies, and she began having problems feeling numb after getting shots at a clinic. (over 11 ED visits in 6 mo).Per counselor note, Pt admits to feeling depressed about her relationship with her teen aged daughter recently and was recently started on Zoloft     PT Comments    Video interpreter [phone] utilized for this visit. Pt still without improvement in L sided weakness however was able to illicit some initiation of movement with distraction. Pt able to side step to the right EOB with PT support and adduct L Leg. Utilized stedy for standing balance and weight shifting, while distracted pt also with light grasp on handle with L hand, as well as controlling a L arm fall with eccentric elbow extension. Patient emotional at time in tears, reports she is motivated to work with therapy and get stronger to return home to her children. Husband present and appears distressed. Does well with encourgement and distraction from weak extremities.    Follow Up Recommendations  CIR;Supervision/Assistance - 24 hour     Equipment Recommendations  Other (comment)(TBD)    Recommendations for Other Services Rehab consult     Precautions / Restrictions Precautions Precautions: Fall Restrictions Weight Bearing Restrictions: No    Mobility  Bed Mobility Overal bed mobility: Needs Assistance Bed Mobility: Supine to Sit     Supine to sit: Mod assist     General bed mobility comments: use of R extremities to support L over EOB, bed rails, mod A to power up trunk  Transfers Overall transfer level: Needs assistance Equipment used: 1 person hand held assist Transfers: Sit to/from Stand Sit to  Stand: Mod assist         General transfer comment: several trials of sit to stands with Stedy, weight shifting in standing. pt tolerated standing for x5 times 2-3 minutes each. Also of note, stood with 1 person assist and side stepped to higher position in bed. pt able to faciliate movement with LLE  Ambulation/Gait             General Gait Details: unable at this time   Stairs             Wheelchair Mobility    Modified Rankin (Stroke Patients Only)       Balance Overall balance assessment: Needs assistance   Sitting balance-Leahy Scale: Good       Standing balance-Leahy Scale: Poor Standing balance comment: reliant on external support                            Cognition Arousal/Alertness: Awake/alert Behavior During Therapy: Flat affect Overall Cognitive Status: Difficult to assess                                 General Comments: Appears WFL; following multi step commands; interactingwith interpreter approrpiately adn answering quesitons appropirately; A & O x 3.       Exercises      General Comments        Pertinent Vitals/Pain Pain Assessment: Faces Faces Pain Scale: Hurts a little bit Pain Location: back Pain Descriptors /  Indicators: Discomfort Pain Intervention(s): Limited activity within patient's tolerance;Monitored during session;Repositioned    Home Living                      Prior Function            PT Goals (current goals can now be found in the care plan section) Acute Rehab PT Goals Patient Stated Goal: get home to children PT Goal Formulation: With patient/family Time For Goal Achievement: 03/06/18 Potential to Achieve Goals: Fair    Frequency    Min 3X/week      PT Plan Current plan remains appropriate    Co-evaluation              AM-PAC PT "6 Clicks" Daily Activity  Outcome Measure  Difficulty turning over in bed (including adjusting bedclothes, sheets and  blankets)?: A Lot Difficulty moving from lying on back to sitting on the side of the bed? : Unable Difficulty sitting down on and standing up from a chair with arms (e.g., wheelchair, bedside commode, etc,.)?: Unable Help needed moving to and from a bed to chair (including a wheelchair)?: Total Help needed walking in hospital room?: A Lot Help needed climbing 3-5 steps with a railing? : Total 6 Click Score: 8    End of Session Equipment Utilized During Treatment: Gait belt Activity Tolerance: Patient limited by fatigue Patient left: in chair;with call bell/phone within reach;with chair alarm set;with family/visitor present Nurse Communication: Mobility status PT Visit Diagnosis: Unsteadiness on feet (R26.81);Other abnormalities of gait and mobility (R26.89);Difficulty in walking, not elsewhere classified (R26.2)     Time: 9702-6378 PT Time Calculation (min) (ACUTE ONLY): 35 min  Charges:  $Therapeutic Activity: 8-22 mins                    G Codes:       Reinaldo Berber, PT, DPT Acute Rehab Services Pager: [phone]     Reinaldo Berber 02/21/2018, 12:21 PM

## 2018-02-21 NOTE — Progress Notes (Addendum)
Patient ID: Lorraine [name], female   DOB: July 20, 1986, 32 y.o.   MRN: [id number]  PROGRESS NOTE    Lorraine [name]  DJM:[id number] DOB: 1986/01/11 DOA: 02/18/2018 PCP: Patient, No Pcp Per   Brief Narrative: 32 year old female presented with left-sided weakness concerning for stroke versus conversion disorder.  Neurology consulted.   Assessment & Plan:   Principal Problem:   Left-sided weakness Active Problems:   Asthma   Pre-diabetes   Weakness   Cervical radiculopathy   Left arm weakness   Left-sided weakness-work-up per neurology team.  MRI brain has been negative.  Tele psychiatry is seen the patient is already signed off on patient recommending outpatient follow-up.  Patient still cannot move her left side today.  Physical therapy recommending rehab.  Unclear etiology of her left-sided weakness but suspecting conversion disorder. Asthma-stable   DVT prophylaxis: SCDs  Code Status: Full  Family Communication: Husband  disposition Plan: Per neuro team    Consultants:   Neurology   Subjective: Patient still reporting she cannot move left side   Objective: Vitals:   02/21/18 0542 02/21/18 0727 02/21/18 0753 02/21/18 1414  BP: 106/69 106/69  111/65  Pulse: 65 65  71  Resp: 16 16  18   Temp: 98.1 F (36.7 C) 98.1 F (36.7 C)  98.6 F (37 C)  TempSrc: Oral Oral  Oral  SpO2: 98%  99% 95%  Weight:  81.6 kg (180 lb)    Height:  5' (1.524 m)     No intake or output data in the 24 hours ending 02/21/18 1459 Filed Weights   02/21/18 0727  Weight: 81.6 kg (180 lb)    Examination:  General exam: Appears calm and comfortable  Respiratory system: Clear to auscultation. Respiratory effort normal. Cardiovascular system: S1 & S2 heard, RRR. No JVD, murmurs, rubs, gallops or clicks. No pedal edema. Gastrointestinal system: Abdomen is nondistended, soft and nontender. No organomegaly or masses felt. Normal bowel sounds heard. Central nervous system: Alert and  oriented. No focal neurological deficits. Extremities: 5 out of 5 strength right upper and lower extremity 0 out of 5 strength left upper and lower extremity patient has full range of motion of her neck cranial nerves II through XII grossly intact  skin: No rashes, lesions or ulcers Psychiatry: Judgement and insight appear normal. Mood & affect depressed    Data Reviewed: I have personally reviewed following labs and imaging studies  CBC: Recent Labs  Lab 02/17/18 0822 02/17/18 0833  WBC 10.1  --   NEUTROABS 4.8  --   HGB 12.8 13.3  HCT 39.6 39.0  MCV 91.2  --   PLT 298  --    Basic Metabolic Panel: Recent Labs  Lab 02/17/18 0822 02/17/18 0833  NA 137 138  K 3.7 3.7  CL 105 104  CO2 24  --   GLUCOSE 105* 104*  BUN 16 16  CREATININE 0.76 0.60  CALCIUM 8.8*  --    GFR: Estimated Creatinine Clearance: 96.3 mL/min (by C-G formula based on SCr of 0.6 mg/dL). Liver Function Tests: Recent Labs  Lab 02/17/18 0822  AST 18  ALT 21  ALKPHOS 82  BILITOT 0.6  PROT 6.5  ALBUMIN 3.6   No results for input(s): LIPASE, AMYLASE in the last 168 hours. No results for input(s): AMMONIA in the last 168 hours. Coagulation Profile: Recent Labs  Lab 02/17/18 0822  INR 0.93   Cardiac Enzymes: No results for input(s): CKTOTAL, CKMB, CKMBINDEX, TROPONINI in the last 168 hours. BNP (  last 3 results) No results for input(s): PROBNP in the last 8760 hours. HbA1C: No results for input(s): HGBA1C in the last 72 hours. CBG: Recent Labs  Lab 02/17/18 0817  GLUCAP 104*   Lipid Profile: No results for input(s): CHOL, HDL, LDLCALC, TRIG, CHOLHDL, LDLDIRECT in the last 72 hours. Thyroid Function Tests: Recent Labs    02/19/18 2113  TSH 0.583   Anemia Panel: No results for input(s): VITAMINB12, FOLATE, FERRITIN, TIBC, IRON, RETICCTPCT in the last 72 hours. Sepsis Labs: No results for input(s): PROCALCITON, LATICACIDVEN in the last 168 hours.  No results found for this or any  previous visit (from the past 240 hour(s)).       Radiology Studies: No results found.      Scheduled Meds: . famotidine  40 mg Oral BID  . fluticasone furoate-vilanterol  1 puff Inhalation Daily  . montelukast  10 mg Oral QHS  . sertraline  25 mg Oral Daily  . sodium chloride flush  3 mL Intravenous Q12H   Continuous Infusions: . sodium chloride       LOS: 0 days    Time spent: 18 minutes   Lorraine [name] A, MD [facility] Pager [phone]  If 7PM-7AM, please contact night-coverage [email or web address] Password [facility] 02/21/2018, 2:59 PM   Dr. Mariea Clonts called for psychiatric consultation as an inpatient.

## 2018-02-21 NOTE — Care Management Note (Signed)
Case Management Note  Patient Details  Name: Lorraine [name] MRN: [id number] Date of Birth: Nov 16, 1985  Subjective/Objective:                 Patient from home still requiring assist of 2 for transfers and unable to initiate ambulation. - CT MRI, looking at for possible conversion disorder, cleared by psych. Patient currently w/o diagnosis suitable for CIR admission and CM asked CSW to take a look at SNF placement.    Action/Plan:   Expected Discharge Date:                  Expected Discharge Plan:  Skilled Nursing Facility  In-House Referral:  Clinical Social Work  Discharge planning Services  CM Consult  Post Acute Care Choice:    Choice offered to:     DME Arranged:    DME Agency:     HH Arranged:    [place] Agency:     Status of Service:  In process, will continue to follow  If discussed at Long Length of Stay Meetings, dates discussed:    Additional Comments:  Carles Collet, RN 02/21/2018, 10:54 AM

## 2018-02-21 NOTE — Progress Notes (Addendum)
3pm- Per CSW Surveyor, quantity, hospital is unable to approve an LOG for patient. Per financial counseling, patient is undocumented but they will see if she can qualify for Emergency Medicaid.   1pm-CSW received consult regarding SNF. CSW staffing case with CSW Surveyor, quantity.   Percell Locus Aquil Duhe LCSW [phone]

## 2018-02-22 DIAGNOSIS — Z9049 Acquired absence of other specified parts of digestive tract: Secondary | ICD-10-CM

## 2018-02-22 DIAGNOSIS — K5790 Diverticulosis of intestine, part unspecified, without perforation or abscess without bleeding: Secondary | ICD-10-CM

## 2018-02-22 DIAGNOSIS — F329 Major depressive disorder, single episode, unspecified: Secondary | ICD-10-CM

## 2018-02-22 DIAGNOSIS — Z9851 Tubal ligation status: Secondary | ICD-10-CM

## 2018-02-22 DIAGNOSIS — R200 Anesthesia of skin: Secondary | ICD-10-CM

## 2018-02-22 DIAGNOSIS — R112 Nausea with vomiting, unspecified: Secondary | ICD-10-CM

## 2018-02-22 DIAGNOSIS — G8929 Other chronic pain: Secondary | ICD-10-CM

## 2018-02-22 DIAGNOSIS — K219 Gastro-esophageal reflux disease without esophagitis: Secondary | ICD-10-CM

## 2018-02-22 DIAGNOSIS — N12 Tubulo-interstitial nephritis, not specified as acute or chronic: Secondary | ICD-10-CM

## 2018-02-22 DIAGNOSIS — R109 Unspecified abdominal pain: Secondary | ICD-10-CM

## 2018-02-22 LAB — IGE+ALLERGENS ZONE 2(30)
AMER SYCAMORE IGE QN: 0.49 kU/L — AB
Aspergillus Fumigatus IgE: <0.1 kU/L
Bahia Grass IgE: <0.1 kU/L
Cat Dander IgE: <0.1 kU/L
Cladosporium Herbarum IgE: <0.1 kU/L
Cockroach, American IgE: <0.1 kU/L
Common Silver Birch IgE: 6.55 kU/L — AB
Elm, American IgE: 0.63 kU/L — AB
G002-IGE BERMUDA GRASS: 0.14 kU/L — AB
Hickory, White IgE: 37.8 kU/L — AB
IgE (Immunoglobulin E), Serum: 646 IU/mL — ABNORMAL HIGH (ref 6–495)
Johnson Grass IgE: 0.1 kU/L — AB
Maple/Box Elder IgE: 0.87 kU/L — AB
Mucor Racemosus IgE: <0.1 kU/L
Mugwort IgE Qn: <0.1 kU/L
Nettle IgE: 1.65 kU/L — AB
Penicillium Chrysogen IgE: <0.1 kU/L
Pigweed, Rough IgE: 0.21 kU/L — AB
Sheep Sorrel IgE Qn: 0.17 kU/L — AB
Sweet gum IgE RAST Ql: 15.8 kU/L — AB
T006-IGE CEDAR, MOUNTAIN: 10.1 kU/L — AB
T007-IGE OAK, WHITE: 21.6 kU/L — AB
TIMOTHY IGE: 3.58 kU/L — AB
W001-IGE RAGWEED, SHORT: 0.19 kU/L — AB
W009-IGE PLANTAIN, ENGLISH: 0.17 kU/L — AB
White Mulberry IgE: <0.1 kU/L

## 2018-02-22 LAB — ALPHA-GAL PANEL
Alpha Gal IgE*: <0.1 kU/L (ref <0.10)
BEEF CLASS INTERPRETATION: 0
Beef (Bos spp) IgE: <0.1 kU/L (ref <0.35)
Class Interpretation: 0
PORK CLASS INTERPRETATION: 0
Pork (Sus spp) IgE: <0.1 kU/L (ref <0.35)

## 2018-02-22 LAB — THYROID ANTIBODIES
Thyroglobulin Antibody: 3.7 IU/mL — ABNORMAL HIGH (ref 0.0–0.9)
Thyroperoxidase Ab SerPl-aCnc: <6 IU/mL (ref 0–34)

## 2018-02-22 LAB — TRYPTASE: TRYPTASE: 2.8 ug/L (ref 2.2–13.2)

## 2018-02-22 LAB — ALLERGEN PROFILE, MOLD
Aureobasidi Pullulans IgE: <0.1 kU/L
Candida Albicans IgE: <0.1 kU/L
M009-IgE Fusarium proliferatum: <0.1 kU/L
M014-IgE Epicoccum purpur: <0.1 kU/L
Phoma Betae IgE: <0.1 kU/L

## 2018-02-22 LAB — C1 ESTERASE INHIBITOR: C1 ESTERASE INH: 33 mg/dL (ref 21–39)

## 2018-02-22 LAB — C1 ESTERASE INHIBITOR, FUNCTIONAL: C1INH Functional/C1INH Total MFr SerPl: 91 %mean normal

## 2018-02-22 LAB — ALLERGEN STINGING INSECT PANEL
Honeybee IgE: 0.31 kU/L — AB
Paper Wasp IgE: <0.1 kU/L
Yellow Jacket, IgE: <0.1 kU/L

## 2018-02-22 LAB — C-REACTIVE PROTEIN: CRP: 1.3 mg/L (ref 0.0–4.9)

## 2018-02-22 LAB — COMPLEMENT COMPONENT C1Q: COMPLEMENT C1Q: 18 mg/dL (ref 11.8–24.4)

## 2018-02-22 LAB — SEDIMENTATION RATE: SED RATE: 24 mm/h (ref 0–32)

## 2018-02-22 MED ORDER — ONDANSETRON HCL 4 MG/2ML IJ SOLN
4.0000 mg | Freq: Once | INTRAMUSCULAR | Status: AC
  Administered 2018-02-22: 4 mg via INTRAVENOUS
  Filled 2018-02-22: qty 2

## 2018-02-22 MED ORDER — SERTRALINE HCL 50 MG PO TABS: 50.0000 mg | ORAL_TABLET | Freq: Every day | ORAL | Status: DC

## 2018-02-22 MED ORDER — FLUOXETINE HCL 20 MG PO CAPS
20.0000 mg | ORAL_CAPSULE | Freq: Every day | ORAL | Status: DC
  Administered 2018-02-22 – 2018-02-25 (×4): 20 mg via ORAL
  Filled 2018-02-22 (×4): qty 1

## 2018-02-22 NOTE — Plan of Care (Signed)
  Problem: Clinical Measurements: Goal: Cardiovascular complication will be avoided Outcome: Progressing   Problem: Clinical Measurements: Goal: Ability to maintain clinical measurements within normal limits will improve Outcome: Progressing   

## 2018-02-22 NOTE — Progress Notes (Signed)
Physical Therapy Treatment Patient Details Name: Lorraine [name] MRN: [id number] DOB: 1985/11/05 Today's Date: 02/22/2018    History of Present Illness 32 year old female comes in for 2-3 day history of left sided numbness/weakness. Pt states that she has been in and out of the hospital with problems with allergies, and she began having problems feeling numb after getting shots at a clinic. (over 11 ED visits in 6 mo).Per counselor note, Pt admits to feeling depressed about her relationship with her teen aged daughter recently and was recently started on Zoloft     PT Comments    Patient progressing slowly towards her goals. Session focusing on sit to stand transfers and reaching for functional strengthening. Requiring min assist for sit to stand transfers and mod assist for low pivot transfers to the right. Benefits from lots of positive reinforcement. Will continue to progress mobility.    Follow Up Recommendations  CIR;Supervision/Assistance - 24 hour     Equipment Recommendations  Other (comment)(TBD)    Recommendations for Other Services Rehab consult     Precautions / Restrictions Precautions Precautions: Fall Restrictions Weight Bearing Restrictions: No    Mobility  Bed Mobility Overal bed mobility: Needs Assistance Bed Mobility: Supine to Sit     Supine to sit: Mod assist     General bed mobility comments: able to roll without assistance. uses right leg underneath left to progress to edge of bed. mod assist to elevate trunk. significantly increased time required and tactile cueing to bring hips to edge of bed.  Transfers Overall transfer level: Needs assistance Equipment used: 1 person hand held assist Transfers: Sit to/from [employer] Sit to Stand: Min assist   Squat pivot transfers: Mod assist     General transfer comment: x4 sit to stands with min assist. cues for upright posture and tactile cueing for hip extension. mod assist for squat  pivot to right from bed to chair.  Ambulation/Gait             General Gait Details: unable at this time   Stairs             Wheelchair Mobility    Modified Rankin (Stroke Patients Only)       Balance Overall balance assessment: Needs assistance   Sitting balance-Leahy Scale: Good       Standing balance-Leahy Scale: Poor Standing balance comment: reliant on external support                            Cognition Arousal/Alertness: Awake/alert Behavior During Therapy: Flat affect Overall Cognitive Status: Difficult to assess                                 General Comments: Appears WFL; following multi step commands; interactingwith interpreter approrpiately adn answering quesitons appropirately; A & O x 3.       Exercises Other Exercises Other Exercises: seated functional reaching with right arm to promote lower extremity input/proprioception    General Comments        Pertinent Vitals/Pain Pain Assessment: Faces Faces Pain Scale: Hurts a little bit Pain Location: back Pain Descriptors / Indicators: Discomfort Pain Intervention(s): Monitored during session    Home Living                      Prior Function  PT Goals (current goals can now be found in the care plan section) Acute Rehab PT Goals Patient Stated Goal: get home to children PT Goal Formulation: With patient/family Time For Goal Achievement: 03/06/18 Potential to Achieve Goals: Fair Progress towards PT goals: Progressing toward goals    Frequency    Min 3X/week      PT Plan Current plan remains appropriate    Co-evaluation              AM-PAC PT "6 Clicks" Daily Activity  Outcome Measure  Difficulty turning over in bed (including adjusting bedclothes, sheets and blankets)?: A Lot Difficulty moving from lying on back to sitting on the side of the bed? : Unable Difficulty sitting down on and standing up from a chair  with arms (e.g., wheelchair, bedside commode, etc,.)?: Unable Help needed moving to and from a bed to chair (including a wheelchair)?: Total Help needed walking in hospital room?: A Lot Help needed climbing 3-5 steps with a railing? : Total 6 Click Score: 8    End of Session Equipment Utilized During Treatment: Gait belt Activity Tolerance: Patient tolerated treatment well Patient left: in chair;with call bell/phone within reach;with chair alarm set;with family/visitor present Nurse Communication: Mobility status PT Visit Diagnosis: Unsteadiness on feet (R26.81);Other abnormalities of gait and mobility (R26.89);Difficulty in walking, not elsewhere classified (R26.2)     Time: 1660-6301 PT Time Calculation (min) (ACUTE ONLY): 30 min  Charges:  $Therapeutic Exercise: 8-22 mins $Therapeutic Activity: 8-22 mins                    G Codes:       Ellamae Sia, PT, DPT Acute Rehabilitation Services  Pager: [phone]    Willy Eddy 02/22/2018, 5:55 PM

## 2018-02-22 NOTE — Consult Note (Signed)
[facility] Face-to-Face Psychiatry Consult   Reason for Consult:  ''suspect Conversion disorder.'' Referring Physician:  Dr. Loralee Pacas A Patient Identification: Lorraine [name] MRN:  [id number] Principal Diagnosis: Left-sided weakness Diagnosis:   Patient Active Problem List   Diagnosis Date Noted  . Cervical radiculopathy [M54.12]   . Left arm weakness [R29.898]   . Left-sided weakness [R53.1] 02/19/2018  . Weakness [R53.1] 02/19/2018  . Rash [R21] 02/09/2018  . Chest pain [R07.9] 02/08/2018  . Acute pancreatitis [K85.90] 04/02/2017  . Abdominal pain, acute, left upper quadrant [R10.12] 04/02/2017  . Intractable nausea and vomiting [R11.2] 04/02/2017  . Asthma in adult [J45.909] 04/02/2017  . Obesity (BMI 30.0-34.9) [E66.9] 04/02/2017  . Fatty infiltration of liver [K76.0] 04/02/2017  . Gastroesophageal reflux disease without esophagitis [K21.9]   . Pre-diabetes [R73.03] 07/15/2015  . Diarrhea [R19.7]   . Hypokalemia [E87.6]   . Bacterial vaginosis [N76.0, B96.89]   . Pyelonephritis [N12] 07/07/2015  . Abdominal pain, right upper quadrant [R10.11]   . Asthma [J45.909] 10/31/2014  . History of preterm delivery [Z87.51] 10/22/2014  . Status post repeat low transverse cesarean section [Z98.891] 09/06/2014  . Diverticulitis [K57.92] 09/10/2012    Total Time spent with patient: 1 hour  Subjective:   Lorraine [name] is a 32 y.o. female patient admitted with left sided weakness and numbness.  HPI: Patient with history of Asthma, diverticulosis, GERD, chronic abd pain, chronic n/v, pre-DM2, remote pyelonephritis and PSHx of cholecystectomy and tubal ligation. Patient speaks spanish with ,imited [place]. History was obtained by using [employer] [phone]. Patient reports history of depression dating back to 3 years ago when her grand mother passed away, she did not received any form of treatment until she was started on low dose Sertraline few weeks ago. 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 She states that she only worry about her teenage daughter once in a while. She denies any issues with her husband, family in [place] or with her other children. She report occasional poor appetite, low energy level, poor appetite, but denies psychosis, delusions, SI/HI or history of past trauma. Patient also denies drugs and alcohol abuse.  Past Psychiatric depression  Risk to Self: Suicidal Ideation: No Suicidal Intent: No Is patient at risk for suicide?: No Suicidal Plan?: No Access to Means: No What has been your use of drugs/alcohol within the last 12 months?: (denies) Intentional Self Injurious Behavior: None Risk to Others: Homicidal Ideation: No Thoughts of Harm to Others: No Current Homicidal Intent: No Current Homicidal Plan: No Access to Homicidal Means: No History of harm to others?: No Assessment of Violence: None Noted Does patient have access to weapons?: No Criminal Charges Pending?: No Does patient have a court date: No Prior Inpatient Therapy: Prior Inpatient Therapy: Yes Prior Therapy Dates: (just started a couple of weeks ago) Prior Therapy Facilty/Provider(s): [facility]") Reason for Treatment: (depression) Prior Outpatient Therapy: Prior Outpatient Therapy: No Does patient have an ACCT team?: No Does patient have Intensive In-House Services?  : No Does patient have [facility] services? : No Does patient have P4CC services?: No  Past Medical History:  Past Medical History:  Diagnosis Date  . Asthma    Inhaler used 01/02/16  . Diverticulitis   . Nausea & vomiting 04/02/2017  . Pancreatitis   . Pre-diabetes   . Pre-diabetes   . Pyelonephritis   . Pyelonephritis     Past Surgical History:  Procedure Laterality Date  .  CESAREAN SECTION    . CESAREAN SECTION N/A 2006  . CESAREAN SECTION N/A 09/03/2014   Procedure: CESAREAN SECTION;  Surgeon: Guss Bunde, MD;  Location: [facility];  Service: Obstetrics;  Laterality: N/A;  . CHOLECYSTECTOMY    . ESOPHAGOGASTRODUODENOSCOPY (EGD) WITH PROPOFOL Left 04/07/2017   Procedure: ESOPHAGOGASTRODUODENOSCOPY (EGD) WITH PROPOFOL;  Surgeon: Ronnette Juniper, MD;  Location: [facility];  Service: Gastroenterology;  Laterality: Left;  . TUBAL LIGATION     Family History:  Family History  Problem Relation Age of Onset  . Hyperlipidemia Mother   . Diabetes Father   . Diabetes Maternal Uncle   . Diabetes Paternal Grandmother   . Asthma Daughter   . Allergic rhinitis Neg Hx   . Angioedema Neg Hx   . Eczema Neg Hx   . Immunodeficiency Neg Hx   . Urticaria Neg Hx    Family Psychiatric  History:  Social History:  Social History   Substance and Sexual Activity  Alcohol Use No     Social History   Substance and Sexual Activity  Drug Use No    Social History   Socioeconomic History  . Marital status: Married    Spouse name: Not on file  . Number of children: Not on file  . Years of education: Not on file  . Highest education level: Not on file  Occupational History  . Not on file  Social Needs  . Financial resource strain: Not on file  . Food insecurity:    Worry: Not on file    Inability: Not on file  . Transportation needs:    Medical: Not on file    Non-medical: Not on file  Tobacco Use  . Smoking status: Never Smoker  . Smokeless tobacco: Never Used  Substance and Sexual Activity  . Alcohol use: No  . Drug use: No  . Sexual activity: Yes    Birth control/protection: Surgical    Comment: tubal ligation that got untied by themselves in june 2018  Lifestyle  . Physical activity:    Days per week: Not on file    Minutes per session: Not on file  . Stress: Not on file  Relationships  . Social connections:    Talks on phone: Not on file    Gets together: Not on file    Attends religious service: Not on file    Active member of club or organization: Not on file    Attends  meetings of clubs or organizations: Not on file    Relationship status: Not on file  Other Topics Concern  . Not on file  Social History Narrative   ** Merged History Encounter **       Additional Social History:    Allergies:   Allergies  Allergen Reactions  . Morphine Rash    Labs: No results found for this or any previous visit (from the past 48 hour(s)).  Current Facility-Administered Medications  Medication Dose Route Frequency Provider Last Rate Last Dose  . 0.9 %  sodium chloride infusion  250 mL Intravenous PRN Vita Erm, NP      . acetaminophen (TYLENOL) tablet 650 mg  650 mg Oral Q6H PRN Vita Erm, NP   650 mg at 02/21/18 0815   Or  . acetaminophen (TYLENOL) suppository 650 mg  650 mg Rectal Q6H PRN Vita Erm, NP      . albuterol (PROVENTIL) (2.5 MG/3ML) 0.083% nebulizer solution 3 mL  3 mL Inhalation Q6H PRN  Vita Erm, NP      . famotidine (PEPCID) tablet 40 mg  40 mg Oral BID Vita Erm, NP   40 mg at 02/22/18 5102  . fluticasone furoate-vilanterol (BREO ELLIPTA) 100-25 MCG/INH 1 puff  1 puff Inhalation Daily Vita Erm, NP   1 puff at 02/22/18 0856  . HYDROcodone-acetaminophen (NORCO/VICODIN) 5-325 MG per tablet 1-2 tablet  1-2 tablet Oral Q6H PRN Vertis Kelch, NP   1 tablet at 02/22/18 0848  . montelukast (SINGULAIR) tablet 10 mg  10 mg Oral QHS Vita Erm, NP   10 mg at 02/21/18 2116  . [START ON 02/23/2018] sertraline (ZOLOFT) tablet 50 mg  50 mg Oral Daily Riva Sesma, MD      . sodium chloride flush (NS) 0.9 % injection 3 mL  3 mL Intravenous Q12H Vita Erm, NP   3 mL at 02/22/18 0839  . sodium chloride flush (NS) 0.9 % injection 3 mL  3 mL Intravenous PRN Tamala Julian Elmo Putt, NP        Musculoskeletal: Strength & Muscle Tone: not tested Gait & Station: not tested-pt lying in bed Patient leans: N/A  Psychiatric Specialty  Exam: Physical Exam  Psychiatric: Her speech is normal. Judgment and thought content normal. Her affect is blunt. She is slowed and withdrawn. Cognition and memory are normal. She exhibits a depressed mood.    Review of Systems  Constitutional: Positive for malaise/fatigue.  HENT: Negative.   Eyes: Negative.   Respiratory: Negative.   Cardiovascular: Negative.   Gastrointestinal: Negative.   Genitourinary: Negative.   Skin: Negative.   Neurological: Positive for weakness.  Endo/Heme/Allergies: Negative.   Psychiatric/Behavioral: Positive for depression.    Blood pressure 116/72, pulse 70, temperature 97.7 F (36.5 C), temperature source Oral, resp. rate (!) 24, height 5' (1.524 m), weight 81.6 kg (180 lb), SpO2 98 %.Body mass index is 35.15 kg/m.  General Appearance: Casual  Eye Contact:  Good  Speech:  Slow  Volume:  Decreased  Mood:  Dysphoric  Affect:  Constricted  Thought Process:  Coherent and Linear  Orientation:  Full (Time, Place, and Person)  Thought Content:  Logical  Suicidal Thoughts:  No  Homicidal Thoughts:  No  Memory:  Immediate;   Fair Recent;   Fair Remote;   Fair  Judgement:  Intact  Insight:  Fair  Psychomotor Activity:  Psychomotor Retardation  Concentration:  Concentration: Fair and Attention Span: Fair  Recall:  Good  Fund of Knowledge:  Good  Language:  Good  Akathisia:  No  Handed:  Right  AIMS (if indicated):     Assets:  Communication Skills Desire for Improvement Social Support  ADL's:  Intact  Cognition:  WNL  Sleep:   fair     Treatment Plan Summary: 32 year old woman with history of depression that was not fully treated. She was admitted to the hospital due to weakness/nubness on the left side of her body. Patient reports some neurovegetative symptoms of depression but denies any significant past trauma or current family stress that can precipitate Conversion disorder. Even, if patient has conversion disorder, it is treated on  outpatient basis by therapist.  Diagnosis: Major depressive disorder  Plan/Recommendations: -Collateral information obtained from patient's husband. -Chart reviewed -Discontinue Sertraline - May consider Prozac 20 mg daily for depression -Refer to outpatient psychiatrist/Threapist upon discharge. -Social worker to assist with outpatient referral. -Patient do not need acute psychiatric intervention at this time. -Re-consult psych service as needed  Disposition: No evidence of imminent risk to self or others at present.   Patient does not meet criteria for psychiatric inpatient admission. Supportive therapy provided about ongoing stressors.  Corena Pilgrim, MD 02/22/2018 3:19 PM

## 2018-02-22 NOTE — Plan of Care (Signed)
  Problem: Clinical Measurements: Goal: Ability to maintain clinical measurements within normal limits will improve 02/22/2018 0623 by Irish Lack, RN Outcome: Progressing 02/22/2018 0623 by Irish Lack, RN Outcome: Progressing   Problem: Clinical Measurements: Goal: Cardiovascular complication will be avoided 02/22/2018 0623 by Irish Lack, RN Outcome: Progressing 02/22/2018 0623 by Irish Lack, RN Outcome: Progressing

## 2018-02-22 NOTE — Progress Notes (Signed)
Patient ID: Lorraine [name], female   DOB: 03-Sep-1986, 32 y.o.   MRN: [id number] Patient ID: Lorraine [name], female   DOB: [birth year], 32 y.o.   MRN: [id number]  PROGRESS NOTE    Lorraine [name]  YPP:[id number] DOB: 11-May-1986 DOA: 02/18/2018 PCP: Patient, No Pcp Per   Brief Narrative: 32 year old female presented with left-sided weakness concerning for stroke versus conversion disorder.  Neurology consulted and seems to have signed off.  Reconsult psychiatry which is pending.   Assessment & Plan:   Principal Problem:   Left-sided weakness Active Problems:   Asthma   Pre-diabetes   Weakness   Cervical radiculopathy   Left arm weakness   Left-sided weakness-work-up per neurology team thus far negative.  MRI brain has been negative.  Tele psychiatry is seen the patient is already signed off on patient recommending outpatient follow-up.  Patient still cannot move her left side today.  Physical therapy recommending rehab.  Unclear etiology of her left-sided weakness but suspecting conversion disorder.  Await psych evaluation.  Asthma-stable   DVT prophylaxis: SCDs  Code Status: Full  Family Communication: Husband  disposition Plan: Per neuro team    Consultants:   Neurology  Inpatient psychiatry  Tele psychiatry   Subjective: No change in patient status  Objective: Vitals:   02/21/18 2100 02/22/18 0700 02/22/18 0716 02/22/18 0856  BP: 119/67 116/72 116/72   Pulse: 77 70 70   Resp: 18 18 (!) 24   Temp: 98.4 F (36.9 C)  97.7 F (36.5 C)   TempSrc: Oral Oral Oral   SpO2: 100% 99% 99% 98%  Weight:      Height:        Intake/Output Summary (Last 24 hours) at 02/22/2018 1207 Last data filed at 02/21/2018 2117 Gross per 24 hour  Intake 3 ml  Output 500 ml  Net -497 ml   Filed Weights   02/21/18 0727  Weight: 81.6 kg (180 lb)    Examination:  General exam: Appears calm and comfortable  Respiratory system: Clear to auscultation. Respiratory effort  normal. Cardiovascular system: S1 & S2 heard, RRR. No JVD, murmurs, rubs, gallops or clicks. No pedal edema. Gastrointestinal system: Abdomen is nondistended, soft and nontender. No organomegaly or masses felt. Normal bowel sounds heard. Central nervous system: Alert and oriented. No focal neurological deficits. Extremities: 5 out of 5 strength right upper and lower extremity 0 out of 5 strength left upper and lower extremity patient has full range of motion of her neck cranial nerves II through XII grossly intact  skin: No rashes, lesions or ulcers Psychiatry: Judgement and insight appear normal. Mood & affect depressed    Data Reviewed: I have personally reviewed following labs and imaging studies  CBC: Recent Labs  Lab 02/17/18 0822 02/17/18 0833  WBC 10.1  --   NEUTROABS 4.8  --   HGB 12.8 13.3  HCT 39.6 39.0  MCV 91.2  --   PLT 298  --    Basic Metabolic Panel: Recent Labs  Lab 02/17/18 0822 02/17/18 0833  NA 137 138  K 3.7 3.7  CL 105 104  CO2 24  --   GLUCOSE 105* 104*  BUN 16 16  CREATININE 0.76 0.60  CALCIUM 8.8*  --    GFR: Estimated Creatinine Clearance: 96.3 mL/min (by C-G formula based on SCr of 0.6 mg/dL). Liver Function Tests: Recent Labs  Lab 02/17/18 0822  AST 18  ALT 21  ALKPHOS 82  BILITOT 0.6  PROT 6.5  ALBUMIN 3.6  No results for input(s): LIPASE, AMYLASE in the last 168 hours. No results for input(s): AMMONIA in the last 168 hours. Coagulation Profile: Recent Labs  Lab 02/17/18 0822  INR 0.93   Cardiac Enzymes: No results for input(s): CKTOTAL, CKMB, CKMBINDEX, TROPONINI in the last 168 hours. BNP (last 3 results) No results for input(s): PROBNP in the last 8760 hours. HbA1C: No results for input(s): HGBA1C in the last 72 hours. CBG: Recent Labs  Lab 02/17/18 0817  GLUCAP 104*   Lipid Profile: No results for input(s): CHOL, HDL, LDLCALC, TRIG, CHOLHDL, LDLDIRECT in the last 72 hours. Thyroid Function Tests: Recent Labs     02/19/18 2113  TSH 0.583   Anemia Panel: No results for input(s): VITAMINB12, FOLATE, FERRITIN, TIBC, IRON, RETICCTPCT in the last 72 hours. Sepsis Labs: No results for input(s): PROCALCITON, LATICACIDVEN in the last 168 hours.  No results found for this or any previous visit (from the past 240 hour(s)).       Radiology Studies: No results found.      Scheduled Meds: . famotidine  40 mg Oral BID  . fluticasone furoate-vilanterol  1 puff Inhalation Daily  . montelukast  10 mg Oral QHS  . sertraline  25 mg Oral Daily  . sodium chloride flush  3 mL Intravenous Q12H   Continuous Infusions: . sodium chloride       LOS: 1 day    Time spent:  15 minutes   Barnes Florek A, MD [facility] Pager [phone]  If 7PM-7AM, please contact night-coverage [email or web address] Password [email or web address] 02/22/2018, 12:07 PM

## 2018-02-22 NOTE — Progress Notes (Signed)
NEURO HOSPITALIST PROGRESS NOTE    Subjective: Patient awake, alert and oriented to self place time and situation and currently no acute distress.  Patient complains of left knee cramping otherwise has no other complaints.  She is inquiring of when she can be discharged home Exam: Vitals:   02/22/18 0856 02/22/18 1633  BP:  109/62  Pulse:  69  Resp:  (!) 21  Temp:  99.4 F (37.4 C)  SpO2: 98% 99%    Physical Exam  HEENT-  Normocephalic, no lesions, without obvious abnormality.  Normal external eye and conjunctiva.   Cardiovascular-  pulses palpable throughout   Lungs- no excessive working breathing.  Saturations within normal limits Extremities- Warm, dry and intact Musculoskeletal-no joint tenderness or edema Skin-warm and dry,   Neuro:  Mental Status: Alert, oriented to self place time month and year and following commands without difficulty Cranial Nerves: II: Inconsistent visual fields on the left when focused on by examiner, but responds to left sided visual stimuli when visual fields are not being formally tested. Ptosis not present. EOMI. PERRL. Smile symmetric.  To tuning fork, she splits at midline when testing forehead, nose, and clavicle vibration sensation, consistent with embellishment. Hearing intact to voice. Palate rises symmetrically. Midline tongue extension. Left shoulder shrug command results in no movement; however, the patient is able to turn her head to right with full strength, which requires activation of the LEFT sternocleidomastoid. Conversely, she apparently could not turn head to the left which suggests embellishment; despite apparent weakness of left SCM, the patient generally keeps her head in midline position without apparent asymmetry of the neck muscles and is able to turn her head to the left without command when I moved to her left side  Motor: Right : Upper extremity   5/5    Left:     Upper extremity   0/5. Does not move to  noxious stimuli.   Lower extremity   5/5     Lower extremity   2/5 --of note: + Hoover's sign - When lifting right thigh she did push down with left thigh (left is the subjectively weak side) Tone and bulk: No atrophy noted Sensory: no sensation left face and left arm; impaired/decreased sensation in  LLE  Deep Tendon Reflexes: 2+ and symmetric throughout. Positive Hoffman's sign bilateral hands Plantars: Right: downgoing   Left: downgoing Cerebellar: normal finger-to-nose on right, not able to do on left she is unable to raise left arm on command,  Gait: Not assessed  Medications:  Scheduled: . famotidine  40 mg Oral BID  . FLUoxetine  20 mg Oral Daily  . fluticasone furoate-vilanterol  1 puff Inhalation Daily  . montelukast  10 mg Oral QHS  . sodium chloride flush  3 mL Intravenous Q12H   Continuous: . sodium chloride     URK:YHCWCB chloride, acetaminophen **OR** acetaminophen, albuterol, HYDROcodone-acetaminophen, sodium chloride flush  Pertinent Labs/Diagnostics:   No results found.  Assessment:  32 year old female with plegic LUE and paretic LLE, insensate on the left. Symptoms evolved from diffuse weakness to left sided weakness at some time during the past 3 days, with patient unable to specify further. Onset of first symptoms 3 days PTA.  Exam findings are difficult to localize other than most likely supratentorial on the right. May be functional/conversion disorder given atypical presentation and affect not congruent with the degree  of disability endorsed by the patient. Reflexes were symmetric and tone was normal bilaterally. There was some inconsistency in the degree of strength present on LLE exam with positive hoovers sign. No facial droop which would be unexpected given presence of both LUE and LLE weakness.  --No hyporeflexia of LUE to suggest a brachial plexopathy or polyradiculopathy. No hyperreflexia to suggest involvement of the motor pathways by a spinal cord or  brain lesion.  Nerve roots and spinal cord were normal on MRI of cervical spine. MRI brain also normal. --Multiple inconsistencies on exam suggest embellishment due to conversion disorder, factitious disorder or malingering. No consistently localizable findings noted.  Evaluated by psychiatry, diagnosed major depressive disorder  Recommendations: 1. EMG/NCS of LUE can be considered as outpatient 2. Consider MRI of the left brachial plexus with contrast, although this is expected to be low-yield 3.  Consider lumbar puncture to rule out infectious process 4.  Follow-up outpatient with psychiatry   Carney Bern DNP [facility] 6:33 PM 02/22/18

## 2018-02-23 NOTE — Progress Notes (Signed)
Patient ID: Lorraine [name], female   DOB: 03-11-1986, 32 y.o.   MRN: [id number] Patient ID: Lorraine [name], female   DOB: 1986/06/03, 32 y.o.   MRN: [id number] Patient ID: Lorraine [name], female   DOB: December 04, 1985, 32 y.o.   MRN: [id number]  PROGRESS NOTE    Lorraine [name]  ASN:[id number] DOB: 04/13/1986 DOA: 02/18/2018 PCP: Patient, No Pcp Per   Brief Narrative: 32 year old female presented with left-sided weakness concerning for stroke versus conversion disorder.  Neurology consulted but neuro work-up essentially has been normal.  Reconsult psychiatry yesterday who recommended outpatient follow-up.  She with persistent left-sided hemiparesis.  Highly suspect conversion disorder.   Assessment & Plan:   Principal Problem:   Left-sided weakness Active Problems:   Asthma   Pre-diabetes   Weakness   Cervical radiculopathy   Left arm weakness   Left-sided weakness with negative neurological work-up highly suspect conversion disorder- -work-up per neurology team thus far negative.  MRI brain has been negative.  Tele psychiatry is seen the patient is already signed off on patient recommending outpatient follow-up.  Patient still cannot move her left side today.  Physical therapy recommending rehab.  Unclear etiology of her left-sided weakness but suspecting conversion disorder.  Inpatient psychiatry reconsulted again yesterday who recommending outpatient follow-up.  Reportedly by nurse from yesterday patient actually was able to stand and transfer herself from bed to chair.  Await physical therapy evaluation today who are recommending inpatient rehab.  Consider discharging the patient when able to do independently daily activities.  Further neurological work-up per neurology team.  Major depression- Zoloft discontinued yesterday psychiatry team and Prozac started.  Recommend outpatient follow-up and no need for inpatient treatment at this time.  Asthma-stable   DVT prophylaxis: SCDs    Code Status: Full  Family Communication: Daughter disposition Plan: Pending physical therapy response   Consultants:   Neurology  Inpatient psychiatry  Tele psychiatry   Subjective: No change in patient status except per physical therapy notes she is doing a little more  Objective: Vitals:   02/22/18 1633 02/22/18 2139 02/23/18 0454 02/23/18 0941  BP: 109/62 108/60 100/63   Pulse: 69 70 85 68  Resp: (!) 21 18 18 18   Temp: 99.4 F (37.4 C) 98.4 F (36.9 C) 98 F (36.7 C)   TempSrc: Oral Oral Oral   SpO2: 99% 98% 98% 97%  Weight:      Height:        Intake/Output Summary (Last 24 hours) at 02/23/2018 1118 Last data filed at 02/22/2018 2105 Gross per 24 hour  Intake 3 ml  Output 500 ml  Net -497 ml   Filed Weights   02/21/18 0727  Weight: 81.6 kg (180 lb)    Examination:  General exam: Appears calm and comfortable  Respiratory system: Clear to auscultation. Respiratory effort normal. Cardiovascular system: S1 & S2 heard, RRR. No JVD, murmurs, rubs, gallops or clicks. No pedal edema. Gastrointestinal system: Abdomen is nondistended, soft and nontender. No organomegaly or masses felt. Normal bowel sounds heard. Central nervous system: Alert and oriented. No focal neurological deficits. Extremities: 5 out of 5 strength right upper and lower extremity 0 out of 5 strength left upper and lower extremity patient has full range of motion of her neck cranial nerves II through XII grossly intact  skin: No rashes, lesions or ulcers Psychiatry: Judgement and insight appear normal. Mood & affect depressed and flat    Data Reviewed: I have personally reviewed following labs and imaging studies  CBC: Recent  Labs  Lab 02/17/18 0822 02/17/18 0833  WBC 10.1  --   NEUTROABS 4.8  --   HGB 12.8 13.3  HCT 39.6 39.0  MCV 91.2  --   PLT 298  --    Basic Metabolic Panel: Recent Labs  Lab 02/17/18 0822 02/17/18 0833  NA 137 138  K 3.7 3.7  CL 105 104  CO2 24  --    GLUCOSE 105* 104*  BUN 16 16  CREATININE 0.76 0.60  CALCIUM 8.8*  --    GFR: Estimated Creatinine Clearance: 96.3 mL/min (by C-G formula based on SCr of 0.6 mg/dL). Liver Function Tests: Recent Labs  Lab 02/17/18 0822  AST 18  ALT 21  ALKPHOS 82  BILITOT 0.6  PROT 6.5  ALBUMIN 3.6   No results for input(s): LIPASE, AMYLASE in the last 168 hours. No results for input(s): AMMONIA in the last 168 hours. Coagulation Profile: Recent Labs  Lab 02/17/18 0822  INR 0.93   Cardiac Enzymes: No results for input(s): CKTOTAL, CKMB, CKMBINDEX, TROPONINI in the last 168 hours. BNP (last 3 results) No results for input(s): PROBNP in the last 8760 hours. HbA1C: No results for input(s): HGBA1C in the last 72 hours. CBG: Recent Labs  Lab 02/17/18 0817  GLUCAP 104*   Lipid Profile: No results for input(s): CHOL, HDL, LDLCALC, TRIG, CHOLHDL, LDLDIRECT in the last 72 hours. Thyroid Function Tests: No results for input(s): TSH, T4TOTAL, FREET4, T3FREE, THYROIDAB in the last 72 hours. Anemia Panel: No results for input(s): VITAMINB12, FOLATE, FERRITIN, TIBC, IRON, RETICCTPCT in the last 72 hours. Sepsis Labs: No results for input(s): PROCALCITON, LATICACIDVEN in the last 168 hours.  No results found for this or any previous visit (from the past 240 hour(s)).       Radiology Studies: No results found.      Scheduled Meds: . famotidine  40 mg Oral BID  . FLUoxetine  20 mg Oral Daily  . fluticasone furoate-vilanterol  1 puff Inhalation Daily  . montelukast  10 mg Oral QHS  . sodium chloride flush  3 mL Intravenous Q12H   Continuous Infusions: . sodium chloride       LOS: 2 days    Time spent:  15 minutes   DAVID,RACHAL A, MD [facility] Pager [phone]  If 7PM-7AM, please contact night-coverage [email or web address] Password [email or web address] 02/23/2018, 11:18 AM

## 2018-02-23 NOTE — Plan of Care (Signed)
  Problem: Clinical Measurements: Goal: Ability to maintain clinical measurements within normal limits will improve Outcome: Progressing   Problem: Pain Managment: Goal: General experience of comfort will improve Outcome: Progressing   Problem: Safety: Goal: Ability to remain free from injury will improve Outcome: Progressing   

## 2018-02-23 NOTE — Progress Notes (Signed)
Patient and family at bedside, family stated that patient was in hospital in [place], and received morphine and had a bad reaction to the the drug, Patient showed RN pictures with rash and welts on her neck arm and leg. She received EPI, and since that incident she has had the left sided paralysis. Patient is currently relaxed with family at bedside.

## 2018-02-24 MED ORDER — ONDANSETRON HCL 4 MG/2ML IJ SOLN: 4.0000 mg | Freq: Four times a day (QID) | INTRAMUSCULAR | Status: DC | PRN

## 2018-02-24 MED ORDER — HEPARIN SODIUM (PORCINE) 5000 UNIT/ML IJ SOLN
5000.0000 [IU] | Freq: Three times a day (TID) | INTRAMUSCULAR | Status: DC
  Administered 2018-02-24 – 2018-02-25 (×3): 5000 [IU] via SUBCUTANEOUS
  Filled 2018-02-24 (×3): qty 1

## 2018-02-24 NOTE — Progress Notes (Signed)
Patient Demographics:    Lorraine [name], is a 32 y.o. female, DOB - Jul 23, 1986, XTA:[id number]  Admit date - 02/18/2018   Admitting Physician Karmen Bongo, MD  Outpatient Primary MD for the patient is Patient, No Pcp Per  LOS - 3   Chief Complaint  Patient presents with  . Arm Pain        Subjective:    Lorraine [name] today has no fevers, no emesis,  No chest pain,  No new concerns    Assessment  & Plan :    Principal Problem:   Left-sided weakness Active Problems:   Asthma   Pre-diabetes   Weakness   Cervical radiculopathy   Left arm weakness  Brief Narrative: 32 year old female presented with left-sided weakness concerning for stroke versus conversion disorder.  Neurology consulted but neuro work-up essentially has been normal.  Reconsult psychiatry yesterday who recommended outpatient follow-up.  She with persistent left-sided hemiparesis.  Highly suspect conversion disorder.   Plan:- 1)Lt sided Weakness--start with neurology service neurology advised no further work-up at this time, no need for LP at this time.  Previous work-up failed to demonstrate etiology of patient's symptoms, concerns for possible conversion disorder, occupational therapist recommends inpatient rehab  2) major depression-continue Prozac  3)Social/Ethics- plan of care discussed with patient, Her pastor interpreted, RN present at bedside  Code Status : full   Disposition Plan  : Rehab ([facility])  Consults  :  Neurol  DVT Prophylaxis  :    Heparin   Lab Results  Component Value Date   PLT 298 02/17/2018    Inpatient Medications  Scheduled Meds: . famotidine  40 mg Oral BID  . FLUoxetine  20 mg Oral Daily  . fluticasone furoate-vilanterol  1 puff Inhalation Daily  . montelukast  10 mg Oral QHS  . sodium chloride flush  3 mL Intravenous Q12H   Continuous Infusions: . sodium chloride     PRN  Meds:.sodium chloride, acetaminophen **OR** acetaminophen, albuterol, HYDROcodone-acetaminophen, ondansetron (ZOFRAN) IV, sodium chloride flush    Anti-infectives (From admission, onward)   None        Objective:   Vitals:   02/23/18 2205 02/24/18 0602 02/24/18 0903 02/24/18 1300  BP: 115/66 106/69  121/63  Pulse: 79 86  73  Resp: 20 16  17   Temp: 98.7 F (37.1 C) 98.1 F (36.7 C)  98.1 F (36.7 C)  TempSrc: Oral Oral  Oral  SpO2: 98% 100% 97% 99%  Weight:      Height:        Wt Readings from Last 3 Encounters:  02/21/18 81.6 kg (180 lb)  02/18/18 81.6 kg (180 lb)  02/14/18 82.8 kg (182 lb 8.7 oz)    No intake or output data in the 24 hours ending 02/24/18 1634   Physical Exam  Gen:- Awake Alert,  In no apparent distress  HEENT:- [place].AT, No sclera icterus Neck-Supple Neck,No JVD,.  Lungs-  CTAB , good air movement CV- S1, S2 normal Abd-  +ve B.Sounds, Abd Soft, No tenderness,    Extremity/Skin:- No  edema,   good pulses Psych-affect is appropriate, oriented x3 Neuro-left-sided hemiparesis persist, unchanged neuro exam from prior documented exam   Data Review:   Micro Results No results found for  this or any previous visit (from the past 240 hour(s)).  Radiology Reports Dg Chest 2 View  Result Date: 02/08/2018 CLINICAL DATA:  Rash and flushing sensation. EXAM: CHEST - 2 VIEW COMPARISON:  Feb 08, 2018 study obtained earlier in the day. FINDINGS: Lungs are clear. Heart size and pulmonary vascularity are normal. No adenopathy. No pneumothorax. No bone lesions. IMPRESSION: No edema or consolidation. Electronically Signed   By: Lowella Grip III M.D.   On: 02/08/2018 20:58   Dg Chest 2 View  Result Date: 02/08/2018 CLINICAL DATA:  Central chest pain, short of breath EXAM: CHEST - 2 VIEW COMPARISON:  02/02/2018, 01/03/2018 FINDINGS: The heart size and mediastinal contours are within normal limits. Both lungs are clear. The visualized skeletal structures are  unremarkable. IMPRESSION: No active cardiopulmonary disease. Electronically Signed   By: Donavan Foil M.D.   On: 02/08/2018 02:07   Ct Head Wo Contrast  Result Date: 02/17/2018 CLINICAL DATA:  Left-sided heaviness and weakness for 2 days EXAM: CT HEAD WITHOUT CONTRAST TECHNIQUE: Contiguous axial images were obtained from the base of the skull through the vertex without intravenous contrast. COMPARISON:  August 01, 2017 FINDINGS: Brain: The ventricles are normal in size and configuration. 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 There is no intracranial mass, hemorrhage, extra-axial fluid collection, or midline shift. Gray-white compartments appear normal. No evident acute infarct. Vascular: There is no hyperdense vessel. There is no evident vascular calcification. Skull: Bony calvarium appears intact. Sinuses/Orbits: There is mucosal thickening in the left maxillary antrum. There is mucosal thickening in several ethmoid air cells. Other visualized paranasal sinuses are clear. Orbits appear symmetric bilaterally. Other: Mastoid air cells are clear. 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 No intracranial mass or hemorrhage. Gray-white compartments appear normal. No evident acute infarct. There are foci of paranasal sinus disease. Electronically Signed   By: Lowella Grip III M.D.   On: 02/17/2018 08:58   Mr Lorraine [name] WN Contrast  Result Date: 02/18/2018 CLINICAL DATA:  Left-sided pain.  Headache EXAM: MRI HEAD WITHOUT AND WITH CONTRAST TECHNIQUE: Multiplanar, multiecho pulse sequences of the brain and surrounding structures were obtained without and with intravenous contrast. CONTRAST:  46mL MULTIHANCE GADOBENATE DIMEGLUMINE 529 MG/ML IV SOLN COMPARISON:  Head CT 02/17/2018 FINDINGS: BRAIN: Partially empty sella. There is no acute infarct or acute hemorrhage. There is no mass lesion or other mass effect. There is no hydrocephalus,  dural abnormality or extra-axial collection. The white matter signal is normal for the patient's age. No age-advanced or lobar predominant atrophy. No chronic microhemorrhage or superficial siderosis. No abnormal contrast enhancement. VASCULAR: Major intracranial arterial and venous sinus flow voids are preserved. SKULL AND UPPER CERVICAL SPINE: The visualized skull base, calvarium, upper cervical spine and extracranial soft tissues are normal. SINUSES/ORBITS: No fluid levels or advanced mucosal thickening. No mastoid or middle ear effusion. The orbits are normal. IMPRESSION: Normal MRI of the brain. Electronically Signed   By: Ulyses Jarred M.D.   On: 02/18/2018 22:44   Mr Cervical Spine Wo Contrast  Result Date: 02/19/2018 CLINICAL DATA:  Left-sided numbness and weakness over the last 3 days. Left arm pain. EXAM: MRI CERVICAL SPINE WITHOUT CONTRAST TECHNIQUE: Multiplanar, multisequence MR imaging of the cervical spine was performed. No intravenous contrast was administered. COMPARISON:  None. FINDINGS: Alignment: Normal Vertebrae: Normal Cord: Normal.  No cord compression or primary cord lesion. Posterior Fossa, vertebral arteries, paraspinal tissues: Arachnoid herniation into the  sella. This can be a normal finding or can be seen with intracranial hypertension. Disc levels: No significant disc pathology. No canal or foraminal stenosis. Minimal, non-compressive disc bulges at C3-4, C4-5 and C5-6. No facet arthropathy. IMPRESSION: Essentially negative study of the cervical spine. Minimal, non-compressive disc bulges from C3-4 through C5-6. No canal or foraminal stenosis. No evidence of facet arthropathy. Electronically Signed   By: Nelson Chimes M.D.   On: 02/19/2018 09:09   Dg Abd Acute W/chest  Result Date: 02/02/2018 CLINICAL DATA:  Acute abdominal pain for 1 week. EXAM: DG ABDOMEN ACUTE W/ 1V CHEST COMPARISON:  01/03/2017 chest radiograph and abdominal CT FINDINGS: There is no evidence of dilated bowel  loops or free intraperitoneal air. No radiopaque calculi or other significant radiographic abnormality is seen. Heart size and mediastinal contours are within normal limits. Both lungs are clear. Tubal ligation and cholecystectomy clips again noted. IMPRESSION: Negative abdominal radiographs.  No acute cardiopulmonary disease. Electronically Signed   By: Margarette Canada M.D.   On: 02/02/2018 19:30     CBC No results for input(s): WBC, HGB, HCT, PLT, MCV, MCH, MCHC, RDW, LYMPHSABS, MONOABS, EOSABS, BASOSABS, BANDABS in the last 168 hours.  Invalid input(s): NEUTRABS, BANDSABD  Chemistries  No results for input(s): NA, K, CL, CO2, GLUCOSE, BUN, CREATININE, CALCIUM, MG, AST, ALT, ALKPHOS, BILITOT in the last 168 hours.  Invalid input(s): GFRCGP ------------------------------------------------------------------------------------------------------------------ No results for input(s): CHOL, HDL, LDLCALC, TRIG, CHOLHDL, LDLDIRECT in the last 72 hours.  Lab Results  Component Value Date   HGBA1C 5.3 02/09/2018   ------------------------------------------------------------------------------------------------------------------ No results for input(s): TSH, T4TOTAL, T3FREE, THYROIDAB in the last 72 hours.  Invalid input(s): FREET3 ------------------------------------------------------------------------------------------------------------------ No results for input(s): VITAMINB12, FOLATE, FERRITIN, TIBC, IRON, RETICCTPCT in the last 72 hours.  Coagulation profile No results for input(s): INR, PROTIME in the last 168 hours.  No results for input(s): DDIMER in the last 72 hours.  Cardiac Enzymes No results for input(s): CKMB, TROPONINI, MYOGLOBIN in the last 168 hours.  Invalid input(s): CK ------------------------------------------------------------------------------------------------------------------ No results found for: BNP   Roxan Hockey M.D on 02/24/2018 at 4:34 PM  Between 7am to 7pm  - Pager - [phone]  After 7pm go to [email or web address] - password [email or web address]  [facility] -  Office  [phone]   Voice Recognition Viviann Spare dictation system was used to create this note, attempts have been made to correct errors. Please contact the author with questions and/or clarifications.

## 2018-02-24 NOTE — Progress Notes (Addendum)
NEURO HOSPITALIST PROGRESS NOTE   Subjective: Patient awake, eyes open, alert and oriented to place and time. NAD. No improvements, but no other complaints at this time. ( language barrier) denies pain.  No family at bedside. Room is warm. Patient afebrile.   Exam: Vitals:   02/23/18 2205 02/24/18 0602  BP: 115/66 106/69  Pulse: 79 86  Resp: 20 16  Temp: 98.7 F (37.1 C) 98.1 F (36.7 C)  SpO2: 98% 100%    Physical Exam   HEENT-  Normocephalic, no lesions, without obvious abnormality.  Normal external eye and conjunctiva.   Cardiovascular- S1-S2 audible, pulses palpable throughout   Lungs-no rhonchi or wheezing noted, no excessive working breathing.  Saturations within normal limits Extremities- Warm, moist and intact Musculoskeletal-no joint tenderness,  swelling Skin-warm and moist, (sweating). afebrile Neuro:  Mental Status: Mental Status: Awake and Alert, oriented to self,  Place. Can follow some simple commands without difficulty ( language barrier- spanish speaking only). Cranial Nerves: II: Inconsistent visual fields on the left when focused on by examiner, but responds to left sided visual stimuli when visual fields are not being formally tested. Ptosis not present. EOMI. PERRL. Smile symmetric.  To tuning fork, she splits at midline when testing forehead, nose, and clavicle vibration sensation, consistent with embellishment. Hearing intact to voice. Palate rises symmetrically. Midline tongue extension. Left shoulder shrug command results in no movement; however, the patient is able to turn her head to right with full strength, which requires activation of the LEFT sternocleidomastoid. Conversely, she apparently could not turn head to the left which suggests embellishment; despite apparent weakness of left SCM, the patient generally keeps her head in midline position without apparent asymmetry of the neck muscles and is able to turn her head to the left  without command when I moved to her left side   Motor: Right : Upper extremity   5/5    Left:     Upper extremity   1/5  Lower extremity   5/5     Lower extremity   2/5 --of note: + Hoover's sign - When lifting right thigh she did push down with left thigh (left is the subjectively weak side) Tone and bulk: No atrophy noted  Sensory: no sensation left face and left arm; decreased sensation in LLE Deep Tendon Reflexes: 2+ and symmetric throughout; did not get a + hoffman's today. Plantars: Right: downgoing   Left: downgoing Cerebellar: normal finger-to-nose on right side, not able to do on left side.  Gait: not assessed d/t weakness    Medications:  Scheduled: . famotidine  40 mg Oral BID  . FLUoxetine  20 mg Oral Daily  . fluticasone furoate-vilanterol  1 puff Inhalation Daily  . montelukast  10 mg Oral QHS  . sodium chloride flush  3 mL Intravenous Q12H   Continuous: . sodium chloride     OVZ:CHYIFO chloride, acetaminophen **OR** acetaminophen, albuterol, HYDROcodone-acetaminophen, sodium chloride flush  Pertinent Labs/Diagnostics:   No results found.  Impression: 32 year old female with plegic LUE and paretic LLE, insensate on the left. Symptoms evolved from diffuse weakness to left sided weakness at some time during the past 3 days, with patient unable to specify further. Onset of first symptoms 3 days PTA.  Reports significant stressors at home with kids. Exam findings are difficult to localize, and I strongly suspect functional/conversion disorder given atypical presentation  and affect not congruent with the degree of disability endorsed by the patient.  Reflexes were symmetric and tone was normal bilaterally. There was some inconsistency in the degree of strength present on LLE exam with positive hoovers sign. No facial droop which would be unexpected given presence of both LUE and LLE weakness. --No hyporeflexia of LUE to suggest a brachial plexopathy or  polyradiculopathy. No hyperreflexia to suggest involvement of the motor pathways by a spinal cord or brain lesion.  Nerve roots and spinal cord were normal on MRI of cervical spine. MRI brain also normal. --Multiple inconsistencies on exam suggest embellishment due to conversion disorder, factitious disorder or malingering. No consistently localizable findings noted.  Evaluated by psychiatry, diagnosed major depressive disorder. Does not think conversion disorder, but if it is would be treated by outpatient psychiatry.  Recommendations:  PT OT No further neurological work up. Do not think there is an infectious or autoimmune component that might need a LP. Consider Outpatient Neurology f/u for EMG NCS. Neurology will sign off - please call with questions.   Laurey Morale, MSN, NP-C [facility] [phone]  Attending neurologist's note to follow  Attending Neurohospitalist Addendum Patient seen and examined with APP/Resident. Agree with the history and physical as documented above. Agree with the plan as documented, which I helped formulate and made edits to in the note above I have independently reviewed the chart, obtained history, review of systems and examined the patient.I have personally reviewed pertinent head/neck/spine imaging (CT/MRI). Please feel free to call with any questions. --- Amie Portland, MD [facility] Pager: [phone]  If 7pm to 7am, please call on call as listed on AMION.   02/24/2018, 8:19 AM

## 2018-02-24 NOTE — Progress Notes (Signed)
Occupational Therapy Treatment Patient Details Name: Lorraine [name] MRN: [id number] DOB: 1986/01/16 Today's Date: 02/24/2018    History of present illness 32 year old female comes in for 2-3 day history of left sided numbness/weakness. Pt states that she has been in and out of the hospital with problems with allergies, and she began having problems feeling numb after getting shots at a clinic. (over 11 ED visits in 6 mo).Per counselor note, Pt admits to feeling depressed about her relationship with her teen aged daughter recently and was recently started on Zoloft    OT comments  Pt progressing towards OT goals, presents supine in bed agreeable to OT tx session. Pt continues to demonstrate LUE weakness, though intermittently demonstrates some muscle initiation/activation during session when assisting with LUE movement. Use of Stedy this session for mobility and transfers; pt completing sit<>stand at [facility] with MinA, completing seated grooming ADLs from [place] at sink with overall minA for ADL completion and mod hand over hand assist provided to engage LUE into ADL task completion; pt requiring minguard assist for static sitting balance. Use of Stedy for transfer to recliner end of session. Feel POC remains appropriate at this time. Will continue to follow acutely to progress pt towards established OT goals.     Follow Up Recommendations  CIR;Supervision/Assistance - 24 hour    Equipment Recommendations  3 in 1 bedside commode;Tub/shower bench;Wheelchair (measurements OT);Wheelchair cushion (measurements OT)          Precautions / Restrictions Precautions Precautions: Fall Restrictions Weight Bearing Restrictions: No       Mobility Bed Mobility Overal bed mobility: Needs Assistance Bed Mobility: Supine to Sit     Supine to sit: Mod assist     General bed mobility comments: verbal cues for technique, assisting pt to use RLE underneath LLE to move towards EOB; assist with use of  bed pad to further scoot hips forwards; pt able to push into R elbow to assist with bringing trunk upright into sitting; use of handrails   Transfers Overall transfer level: Needs assistance Equipment used: Ambulation equipment used Transfers: Sit to/from Stand Sit to Stand: Min assist         General transfer comment: assist to rise and steady with hand over hand assist to support LUE onto Stedy; pt able to maintain static standing initially with minA, progressed to close minguard during second trial of sit<>stand; use of Stedy for transfer to recliner after completion of grooming ADLs    Balance Overall balance assessment: Needs assistance Sitting-balance support: Feet unsupported         Standing balance-Leahy Scale: Poor Standing balance comment: reliant on external support                           ADL either performed or assessed with clinical judgement   ADL Overall ADL's : Needs assistance/impaired     Grooming: Sitting;Oral care;Wash/dry face;Minimal assistance;Min guard Grooming Details (indicate cue type and reason): sitting in Stedy at sink; assist to reach with RUE to turn on water; hand over hand assist to hold/manipulate grooming items with LUE during bi-manual tasks                                General ADL Comments: use of Stedy during session for completion of grooming ADLs seated at sink; Pt requiring assist to use LUE; though noted is able to maintain certain  positions for short periods of time when UE is placed; transferred to recliner end of session using Stedy                        Cognition Arousal/Alertness: Awake/alert Behavior During Therapy: Flat affect Overall Cognitive Status: Difficult to assess                                 General Comments: Appears WFL; following multi step commands; interactingwith interpreter approrpiately adn answering quesitons appropirately; A & O x 3.                      General Comments use of video language interpreter during session [facility] [phone])    Pertinent Vitals/ Pain       Pain Assessment: Faces Pain Score: 4  Pain Location: Left lower back, head  Pain Descriptors / Indicators: Headache;Sore Pain Intervention(s): Monitored during session;Repositioned;Patient requesting pain meds-RN notified                                                          Frequency  Min 2X/week        Progress Toward Goals  OT Goals(current goals can now be found in the care plan section)  Progress towards OT goals: Progressing toward goals  Acute Rehab OT Goals Patient Stated Goal: get home to children OT Goal Formulation: With patient Time For Goal Achievement: 03/06/18 Potential to Achieve Goals: Good  Plan Discharge plan remains appropriate                     AM-PAC PT "6 Clicks" Daily Activity     Outcome Measure   Help from another person eating meals?: A Little Help from another person taking care of personal grooming?: A Little Help from another person toileting, which includes using toliet, bedpan, or urinal?: A Lot Help from another person bathing (including washing, rinsing, drying)?: A Lot Help from another person to put on and taking off regular upper body clothing?: A Lot Help from another person to put on and taking off regular lower body clothing?: A Lot 6 Click Score: 14    End of Session Equipment Utilized During Treatment: Gait belt;Other (comment)(Stedy)  OT Visit Diagnosis: Other abnormalities of gait and mobility (R26.89);Muscle weakness (generalized) (M62.81);Low vision, both eyes (H54.2);Hemiplegia and hemiparesis;Pain Hemiplegia - Right/Left: Left Hemiplegia - dominant/non-dominant: Non-Dominant Hemiplegia - caused by: Unspecified Pain - part of body: (back )   Activity Tolerance Patient tolerated treatment well   Patient Left in chair;with call bell/phone within  reach;with chair alarm set;with family/visitor present   Nurse Communication Mobility status        Time: 0623-7628 OT Time Calculation (min): 32 min  Charges: OT General Charges $OT Visit: 1 Visit OT Treatments $Self Care/Home Management : 23-37 mins  Lorraine [name], OT Pager [phone] 02/24/2018   Raymondo Band 02/24/2018, 10:54 AM

## 2018-02-24 NOTE — Progress Notes (Signed)
CSW placed follow-up psychiatric info in patient's After Visit Summary. [facility] has Press photographer.   CSW signing off.   Percell Locus Anagabriela Jokerst LCSW [phone]

## 2018-02-24 NOTE — Telephone Encounter (Signed)
Called patient no answer unable to leave message 

## 2018-02-24 NOTE — Progress Notes (Signed)
Inpatient Rehabilitation Admissions Coordinator   Pt is not a candidate for an inpt rehab admission with a diagnosis of conversion disorder. As stated previously on 02/20/18, there is not a diagnosis for pt to be considered for admit at this time. Please call me with any questions.  Danne Baxter, RN, MSN Rehab Admissions Coordinator [phone] 02/24/2018 9:46 PM

## 2018-02-24 NOTE — Progress Notes (Signed)
PT Cancellation Note  Patient Details Name: Rudine Rieger MRN: [id number] DOB: 03-23-1986   Cancelled Treatment:    Reason Eval/Treat Not Completed: Patient declined, no reason specified. Pt declining treatment on arrival to room, with c/o nausea and vomiting. Vital signs taken and stable. RN notified. Will check back as time allows.  Benjiman Core, PTA Pager [phone] Acute Rehab   Allena Katz 02/24/2018, 12:26 PM

## 2018-02-25 ENCOUNTER — Telehealth: Payer: Self-pay | Admitting: Licensed Clinical Social Worker

## 2018-02-25 MED ORDER — RANITIDINE HCL 300 MG PO TABS: ORAL_TABLET | ORAL | 5 refills | Status: DC

## 2018-02-25 MED ORDER — FLUTICASONE FUROATE-VILANTEROL 100-25 MCG/INH IN AEPB: INHALATION_SPRAY | RESPIRATORY_TRACT | 3 refills | Status: DC

## 2018-02-25 MED ORDER — EPINEPHRINE 0.3 MG/0.3ML IJ SOAJ: INTRAMUSCULAR | 1 refills | Status: DC

## 2018-02-25 MED ORDER — FLUOXETINE HCL 20 MG PO CAPS: 20.0000 mg | ORAL_CAPSULE | Freq: Every day | ORAL | 3 refills | Status: DC

## 2018-02-25 MED ORDER — CYCLOBENZAPRINE HCL 10 MG PO TABS: 10.0000 mg | ORAL_TABLET | Freq: Every day | ORAL | 3 refills | Status: DC

## 2018-02-25 MED ORDER — MONTELUKAST SODIUM 10 MG PO TABS: ORAL_TABLET | ORAL | 5 refills | Status: DC

## 2018-02-25 MED ORDER — ZOLPIDEM TARTRATE 5 MG PO TABS
5.0000 mg | ORAL_TABLET | Freq: Every evening | ORAL | Status: DC | PRN
  Administered 2018-02-25: 5 mg via ORAL
  Filled 2018-02-25: qty 1

## 2018-02-25 MED ORDER — ALBUTEROL SULFATE HFA 108 (90 BASE) MCG/ACT IN AERS: 2.0000 | INHALATION_SPRAY | Freq: Four times a day (QID) | RESPIRATORY_TRACT | 3 refills | Status: DC | PRN

## 2018-02-25 NOTE — Progress Notes (Signed)
Nsg Discharge Note  Admit Date:  02/18/2018 Discharge date: 02/25/2018   Lorraine [name] to be D/C'd Home per MD order.  AVS completed.  Copy for chart, and copy for patient signed, and dated. Patient/caregiver able to verbalize understanding.  Discharge Medication: Allergies as of 02/25/2018      Reactions   Morphine Rash      Medication List    STOP taking these medications   famotidine 40 MG tablet Commonly known as:  PEPCID   sertraline 25 MG tablet Commonly known as:  ZOLOFT     TAKE these medications   albuterol 108 (90 Base) MCG/ACT inhaler Commonly known as:  PROVENTIL HFA;VENTOLIN HFA Inhale 2 puffs into the lungs every 6 (six) hours as needed for wheezing or shortness of breath.   cyclobenzaprine 10 MG tablet Commonly known as:  FLEXERIL Take 1 tablet (10 mg total) by mouth at bedtime.   EPINEPHrine 0.3 mg/0.3 mL Soaj injection Commonly known as:  EPI-PEN Use as directed for severe allergic reaction   FLUoxetine 20 MG capsule Commonly known as:  PROZAC Take 1 capsule (20 mg total) by mouth daily. Start taking on:  02/26/2018   fluticasone furoate-vilanterol 100-25 MCG/INH Aepb Commonly known as:  BREO ELLIPTA Inhale 1 puff daily and brush teeth and tongue after use.   montelukast 10 MG tablet Commonly known as:  SINGULAIR Take one tablet once at bedtime   ranitidine 300 MG tablet Commonly known as:  ZANTAC Take one tablet once daily            Durable Medical Equipment  (From admission, onward)        Start     Ordered   02/25/18 1126  DME tub bench  Once    Comments:  Left-sided weakness and gait problems   02/25/18 1127   02/25/18 1126  DME 3-in-1  Once    Comments:  Left-sided weakness and gait problems   02/25/18 1127   02/25/18 1125  For home use only DME wheelchair cushion (seat and back)  (Wheelchairs)  Once    Comments:  Left-sided weakness and gait problems   02/25/18 1127   02/25/18 1125  For home use only DME Walker   [facility])  Once    Comments:  Left-sided weakness and gait problems  Question:  Patient needs a walker to treat with the following condition  Answer:  Hemiplegia ([place])   02/25/18 1127   02/25/18 1017  For home use only DME standard manual wheelchair with seat cushion  Once    Comments:  Patient suffers from conversion disorder which impairs their ability to perform daily activities like wal in the home.  A walker  will not resolve  issue with performing activities of daily living. A wheelchair will allow patient to safely perform daily activities. Patient can safely propel the wheelchair in the home or has a caregiver who can provide assistance.  Accessories: elevating leg rests (ELRs), wheel locks, extensions and anti-tippers.   02/25/18 1018   02/25/18 1016  For home use only DME 3 n 1  Once     02/25/18 1018      Discharge Assessment: Vitals:   02/25/18 0531 02/25/18 1311  BP: 98/62 100/61  Pulse: 80 71  Resp: 17 17  Temp: 97.8 F (36.6 C)   SpO2: 98% 97%   Skin clean, dry and intact without evidence of skin break down, no evidence of skin tears noted. IV catheter discontinued intact. Site without signs and symptoms  of complications - no redness or edema noted at insertion site, patient denies c/o pain - only slight tenderness at site.  Dressing with slight pressure applied.  D/c Instructions-Education: Discharge instructions given to patient/family with verbalized understanding. D/c education completed with patient/family including follow up instructions, medication list, d/c activities limitations if indicated, with other d/c instructions as indicated by MD - patient able to verbalize understanding, all questions fully answered. Patient instructed to return to ED, call 911, or call MD for any changes in condition.  Patient escorted via [place], and D/C home via private auto.  Lorraine [name] Margaretha Sheffield, RN 02/25/2018 5:32 PM

## 2018-02-25 NOTE — Telephone Encounter (Signed)
Pt called LCSW and left message regarding rescheduling her missed counseling session due to being in the hospital currently. LCSW called pt back and left a voicemail.

## 2018-02-25 NOTE — Progress Notes (Signed)
Physical Therapy Treatment Patient Details Name: Lorraine [name] MRN: [id number] DOB: [birth year] Today's Date: 02/25/2018    History of Present Illness 32 year old female comes in for 2-3 day history of left sided numbness/weakness. Pt states that she has been in and out of the hospital with problems with allergies, and she began having problems feeling numb after getting shots at a clinic. (over 11 ED visits in 6 mo).Per counselor note, Pt admits to feeling depressed about her relationship with her teen aged daughter recently and was recently started on Zoloft     PT Comments    Daughter present for session - providing interpretation from patient <> PT. Patient today requiring Min A for bed mobility and transfers with heavy verbal cueing for sequencing and efficiency of activity. Patient tending to utilize R LE to assist with L LE management. Patient able to sit EOB with ability to maintain midline with no overt lateral lean without support. Standing x2 at [facility] for ~3 min with seemingly good weight distribution onto B LE but at times requiring Min A to maintain upright.    Follow Up Recommendations  CIR;Supervision/Assistance - 24 hour     Equipment Recommendations  Other (comment)(TBD)    Recommendations for Other Services Rehab consult     Precautions / Restrictions Precautions Precautions: Fall Restrictions Weight Bearing Restrictions: No    Mobility  Bed Mobility Overal bed mobility: Needs Assistance Bed Mobility: Supine to Sit;Sit to Supine     Supine to sit: Min assist Sit to supine: Min assist   General bed mobility comments: Min A for LE management - patient uses R LE under L LE to self assist; verbal cueing for sequencing; able to sit EOB for prolonged periods without overt balance deficits  Transfers Overall transfer level: Needs assistance Equipment used: Rolling walker (2 wheeled) Transfers: Sit to/from Stand Sit to Stand: Min assist         General  transfer comment: Min A to power up to standing  Ambulation/Gait                 Stairs             Wheelchair Mobility    Modified Rankin (Stroke Patients Only)       Balance Overall balance assessment: Needs assistance Sitting-balance support: Feet supported;No upper extremity supported Sitting balance-Leahy Scale: Good     Standing balance support: Single extremity supported Standing balance-Leahy Scale: Poor Standing balance comment: Min A to Min guard for upright posture                            Cognition Arousal/Alertness: Awake/alert Behavior During Therapy: Flat affect Overall Cognitive Status: Within Functional Limits for tasks assessed                                 General Comments: able to participate in conversation using daughter as interpreter; interacts with PT and daughter appropriately      Exercises      General Comments        Pertinent Vitals/Pain Pain Assessment: No/denies pain    Home Living                      Prior Function            PT Goals (current goals can now be found in the care  plan section) Acute Rehab PT Goals Patient Stated Goal: get home to children PT Goal Formulation: With patient/family Time For Goal Achievement: 03/06/18 Potential to Achieve Goals: Fair Progress towards PT goals: Progressing toward goals    Frequency    Min 3X/week      PT Plan Current plan remains appropriate    Co-evaluation              AM-PAC PT "6 Clicks" Daily Activity  Outcome Measure  Difficulty turning over in bed (including adjusting bedclothes, sheets and blankets)?: A Lot Difficulty moving from lying on back to sitting on the side of the bed? : Unable Difficulty sitting down on and standing up from a chair with arms (e.g., wheelchair, bedside commode, etc,.)?: Unable Help needed moving to and from a bed to chair (including a wheelchair)?: A Lot Help needed walking  in hospital room?: A Lot Help needed climbing 3-5 steps with a railing? : Total 6 Click Score: 9    End of Session Equipment Utilized During Treatment: Gait belt Activity Tolerance: Patient tolerated treatment well Patient left: with call bell/phone within reach;with family/visitor present;in bed;with bed alarm set Nurse Communication: Mobility status PT Visit Diagnosis: Unsteadiness on feet (R26.81);Other abnormalities of gait and mobility (R26.89);Difficulty in walking, not elsewhere classified (R26.2)     Time: 1829-9371 PT Time Calculation (min) (ACUTE ONLY): 28 min  Charges:  $Therapeutic Activity: 23-37 mins                    G Codes:        Lanney Gins, PT, DPT 02/25/18 3:19 PM

## 2018-02-25 NOTE — Progress Notes (Signed)
    Durable Medical Equipment  (From admission, onward)        Start     Ordered   02/25/18 1017  For home use only DME standard manual wheelchair with seat cushion  Once    Comments:  Patient suffers from conversion disorder which impairs their ability to perform daily activities like wal in the home.  A walker  will not resolve  issue with performing activities of daily living. A wheelchair will allow patient to safely perform daily activities. Patient can safely propel the wheelchair in the home or has a caregiver who can provide assistance.  Accessories: elevating leg rests (ELRs), wheel locks, extensions and anti-tippers.   02/25/18 1018   02/25/18 1016  For home use only DME 3 n 1  Once     02/25/18 1018

## 2018-02-25 NOTE — Discharge Summary (Signed)
Lorraine [name], is a 32 y.o. female  DOB 12-21-1985  MRN [id number].  Admission date:  02/18/2018  Admitting Physician  Karmen Bongo, MD  Discharge Date:  02/25/2018   Primary MD  Patient, No Pcp Per  Recommendations for primary care physician for things to follow:   1)Return here as needed.  Follow-up with the doctor provided.  Your MRI did not show any significant abnormality at this time. 2) fall precautions (avoid falling)- Avoid working on ladders or at heights.  Ensure the water temperature is not too high on the home water heater. Do not go swimming alone. When caring for infants or small children, sit down when holding, feeding, or changing them to minimize risk of injury to the child in the event you fall 3) take Prozac as prescribed 4) follow-up with [facility] as prescribed   Admission Diagnosis  Cervical radiculopathy [M54.12] Left arm weakness [R29.898] Left-sided weakness [R53.1]   Discharge Diagnosis  Cervical radiculopathy [M54.12] Left arm weakness [R29.898] Left-sided weakness [R53.1]    Principal Problem:   Left-sided weakness Active Problems:   Asthma   Pre-diabetes   Weakness   Cervical radiculopathy   Left arm weakness      Past Medical History:  Diagnosis Date  . Asthma    Inhaler used 01/02/16  . Diverticulitis   . Nausea & vomiting 04/02/2017  . Pancreatitis   . Pre-diabetes   . Pre-diabetes   . Pyelonephritis   . Pyelonephritis     Past Surgical History:  Procedure Laterality Date  . CESAREAN SECTION    . CESAREAN SECTION N/A 2006  . CESAREAN SECTION N/A 09/03/2014   Procedure: CESAREAN SECTION;  Surgeon: Guss Bunde, MD;  Location: [facility];  Service: Obstetrics;  Laterality: N/A;  . CHOLECYSTECTOMY    . ESOPHAGOGASTRODUODENOSCOPY (EGD) WITH PROPOFOL Left 04/07/2017   Procedure: ESOPHAGOGASTRODUODENOSCOPY (EGD) WITH PROPOFOL;  Surgeon:  Ronnette Juniper, MD;  Location: [facility];  Service: Gastroenterology;  Laterality: Left;  . TUBAL LIGATION         HPI  from the history and physical done on the day of admission:    Chief Complaint: left-sided weakness  HPI: Lorraine [name] is a 32 y.o. female with medical history significant of asthma, prediabetes, anxiety/depression recently admitted to [facility] service on 5/24 for rash with chest tightness and anaphylaxis-like episode. It appears she was admitted for ACS workup which was unremarkable. Pt with frequent visits to [facility] ED as well as [facility]. She presented to [facility] ED ~24 hours ago with complaints of left-sided weakness. Symptoms began ~3 days ago and were generalized and now solely left-sided. Pt reports inability to move LUE and very limited movement and feeling to LLE. She reports discomfort in chest wall and left back as well as left leg though no sensation to touch in leg. She also reports some headache, dizziness and nausea. Pt denies recent fever, abdominal pain, vomiting or diarrhea, no recent travel.  Pt was seen at [facility] ED on 6/1 with similar complaints. Workup  included there following:  -CBC/CMET unremarkable -lipase 30 -u/a negative -negative urine preg -chest xray unremarkable   ED Course: CBC and CMET unremarkable, trop poc negative, CRP 1.3. MRI brain unremarkable. MRI cspine essentially negative with minimal, noncompressive disc bulges C3-4, C5-6. Pt seen in ED by neurology who recommended both MRIs. After my exam with pt, I have requested ED call psych for any recommendations prior to admit.      Hospital Course:    Brief Narrative:32 year old female presented with left-sided weakness concerning for stroke versus conversion disorder. Neurology consultedbut neuro work-up essentially has been normal. Reconsult psychiatry yesterday who recommended outpatient follow-up. She has persistent left-sided hemiparesis. Neurology service states this is likely  conversion disorder.   Plan:- 1)Lt sided Weakness-- d/w neurology service neurology advised no further work-up at this time, no need for LP at this time as per Neurology service.   Previous Neuro work-up failed to demonstrate etiology of patient's symptoms, concerns for possible conversion disorder, occupational therapist recommends inpatient rehab, rehab declined  2) major depression- stable, continue Prozac  3)Social/Ethics- plan of care discussed with patient, Her pastor and her daughter  interpreted, RN present at bedside  Code Status : full  Disposition Plan  : [facility] with [facility]  Consults  :  Neurology  Discharge Condition: stable  Follow UP  Follow-up Information    your primary care doctor On 02/21/2018.   Why:  for further evaluation       Ashok Pall, MD.   Specialty:  Neurosurgery Contact information: [address] [address] [phone]        [place] Go to.   Specialty:  Professional Counselor Why:  Walk in for first appointment Contact information: [place] [id number] [place]. Go on 03/06/2018.   Why:  8:30 am, post hospital follow up appointment Contact information: [place] [phone] [phone]          Diet and Activity recommendation:  As advised  Discharge Instructions     Discharge Instructions    Call MD for:  difficulty breathing, headache or visual disturbances   Complete by:  As directed    Call MD for:  extreme fatigue   Complete by:  As directed    Call MD for:  persistant dizziness or light-headedness   Complete by:  As directed    Call MD for:  persistant nausea and vomiting   Complete by:  As directed    Call MD for:  severe uncontrolled pain   Complete by:  As directed    Call MD for:  temperature >100.4   Complete by:  As directed     Diet - low sodium heart healthy   Complete by:  As directed    Increase activity slowly   Complete by:  As directed         Discharge Medications     Allergies as of 02/25/2018      Reactions   Morphine Rash      Medication List    STOP taking these medications   famotidine 40 MG tablet Commonly known as:  PEPCID   sertraline 25 MG tablet Commonly known as:  ZOLOFT     TAKE these medications   albuterol 108 (90 Base) MCG/ACT inhaler Commonly known as:  PROVENTIL HFA;VENTOLIN HFA Inhale 2 puffs into the lungs every 6 (  six) hours as needed for wheezing or shortness of breath.   cyclobenzaprine 10 MG tablet Commonly known as:  FLEXERIL Take 1 tablet (10 mg total) by mouth at bedtime.   EPINEPHrine 0.3 mg/0.3 mL Soaj injection Commonly known as:  EPI-PEN Use as directed for severe allergic reaction   FLUoxetine 20 MG capsule Commonly known as:  PROZAC Take 1 capsule (20 mg total) by mouth daily. Start taking on:  02/26/2018   fluticasone furoate-vilanterol 100-25 MCG/INH Aepb Commonly known as:  BREO ELLIPTA Inhale 1 puff daily and brush teeth and tongue after use.   montelukast 10 MG tablet Commonly known as:  SINGULAIR Take one tablet once at bedtime   ranitidine 300 MG tablet Commonly known as:  ZANTAC Take one tablet once daily            Durable Medical Equipment  (From admission, onward)        Start     Ordered   02/25/18 1126  DME tub bench  Once    Comments:  Left-sided weakness and gait problems   02/25/18 1127   02/25/18 1126  DME 3-in-1  Once    Comments:  Left-sided weakness and gait problems   02/25/18 1127   02/25/18 1125  For home use only DME wheelchair cushion (seat and back)  (Wheelchairs)  Once    Comments:  Left-sided weakness and gait problems   02/25/18 1127   02/25/18 1125  For home use only DME Walker  [facility])  Once    Comments:  Left-sided weakness and gait problems  Question:  Patient needs a walker to treat with the  following condition  Answer:  Hemiplegia ([place])   02/25/18 1127   02/25/18 1017  For home use only DME standard manual wheelchair with seat cushion  Once    Comments:  Patient suffers from conversion disorder which impairs their ability to perform daily activities like wal in the home.  A walker  will not resolve  issue with performing activities of daily living. A wheelchair will allow patient to safely perform daily activities. Patient can safely propel the wheelchair in the home or has a caregiver who can provide assistance.  Accessories: elevating leg rests (ELRs), wheel locks, extensions and anti-tippers.   02/25/18 1018   02/25/18 1016  For home use only DME 3 n 1  Once     02/25/18 1018      Major procedures and Radiology Reports - PLEASE review detailed and final reports for all details, in brief -    Dg Chest 2 View  Result Date: 02/08/2018 CLINICAL DATA:  Rash and flushing sensation. EXAM: CHEST - 2 VIEW COMPARISON:  Feb 08, 2018 study obtained earlier in the day. FINDINGS: Lungs are clear. Heart size and pulmonary vascularity are normal. No adenopathy. No pneumothorax. No bone lesions. IMPRESSION: No edema or consolidation. Electronically Signed   By: Lowella Grip III M.D.   On: 02/08/2018 20:58   Dg Chest 2 View  Result Date: 02/08/2018 CLINICAL DATA:  Central chest pain, short of breath EXAM: CHEST - 2 VIEW COMPARISON:  02/02/2018, 01/03/2018 FINDINGS: The heart size and mediastinal contours are within normal limits. Both lungs are clear. The visualized skeletal structures are unremarkable. IMPRESSION: No active cardiopulmonary disease. Electronically Signed   By: Donavan Foil M.D.   On: 02/08/2018 02:07   Ct Head Wo Contrast  Result Date: 02/17/2018 CLINICAL DATA:  Left-sided heaviness and weakness for 2 days EXAM: CT HEAD WITHOUT CONTRAST TECHNIQUE:  Contiguous axial images were obtained from the base of the skull through the vertex without intravenous contrast. COMPARISON:   August 01, 2017 FINDINGS: Brain: The ventricles are normal in size and configuration. 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 There is no intracranial mass, hemorrhage, extra-axial fluid collection, or midline shift. Gray-white compartments appear normal. No evident acute infarct. Vascular: There is no hyperdense vessel. There is no evident vascular calcification. Skull: Bony calvarium appears intact. Sinuses/Orbits: There is mucosal thickening in the left maxillary antrum. There is mucosal thickening in several ethmoid air cells. Other visualized paranasal sinuses are clear. Orbits appear symmetric bilaterally. Other: Mastoid air cells are clear. 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 No intracranial mass or hemorrhage. Gray-white compartments appear normal. No evident acute infarct. There are foci of paranasal sinus disease. Electronically Signed   By: Lowella Grip III M.D.   On: 02/17/2018 08:58   Mr Jeri Cos AS Contrast  Result Date: 02/18/2018 CLINICAL DATA:  Left-sided pain.  Headache EXAM: MRI HEAD WITHOUT AND WITH CONTRAST TECHNIQUE: Multiplanar, multiecho pulse sequences of the brain and surrounding structures were obtained without and with intravenous contrast. CONTRAST:  75mL MULTIHANCE GADOBENATE DIMEGLUMINE 529 MG/ML IV SOLN COMPARISON:  Head CT 02/17/2018 FINDINGS: BRAIN: Partially empty sella. There is no acute infarct or acute hemorrhage. There is no mass lesion or other mass effect. There is no hydrocephalus, dural abnormality or extra-axial collection. The white matter signal is normal for the patient's age. No age-advanced or lobar predominant atrophy. No chronic microhemorrhage or superficial siderosis. No abnormal contrast enhancement. VASCULAR: Major intracranial arterial and venous sinus flow voids are preserved. SKULL AND UPPER CERVICAL SPINE: The visualized skull base, calvarium, upper cervical spine and  extracranial soft tissues are normal. SINUSES/ORBITS: No fluid levels or advanced mucosal thickening. No mastoid or middle ear effusion. The orbits are normal. IMPRESSION: Normal MRI of the brain. Electronically Signed   By: Ulyses Jarred M.D.   On: 02/18/2018 22:44   Mr Cervical Spine Wo Contrast  Result Date: 02/19/2018 CLINICAL DATA:  Left-sided numbness and weakness over the last 3 days. Left arm pain. EXAM: MRI CERVICAL SPINE WITHOUT CONTRAST TECHNIQUE: Multiplanar, multisequence MR imaging of the cervical spine was performed. No intravenous contrast was administered. COMPARISON:  None. FINDINGS: Alignment: Normal Vertebrae: Normal Cord: Normal.  No cord compression or primary cord lesion. Posterior Fossa, vertebral arteries, paraspinal tissues: Arachnoid herniation into the sella. This can be a normal finding or can be seen with intracranial hypertension. Disc levels: No significant disc pathology. No canal or foraminal stenosis. Minimal, non-compressive disc bulges at C3-4, C4-5 and C5-6. No facet arthropathy. IMPRESSION: Essentially negative study of the cervical spine. Minimal, non-compressive disc bulges from C3-4 through C5-6. No canal or foraminal stenosis. No evidence of facet arthropathy. Electronically Signed   By: Nelson Chimes M.D.   On: 02/19/2018 09:09   Dg Abd Acute W/chest  Result Date: 02/02/2018 CLINICAL DATA:  Acute abdominal pain for 1 week. EXAM: DG ABDOMEN ACUTE W/ 1V CHEST COMPARISON:  01/03/2017 chest radiograph and abdominal CT FINDINGS: There is no evidence of dilated bowel loops or free intraperitoneal air. No radiopaque calculi or other significant radiographic abnormality is seen. Heart size and mediastinal contours are within normal limits. Both lungs are clear. Tubal ligation and cholecystectomy clips again noted. IMPRESSION: Negative abdominal radiographs.  No acute cardiopulmonary disease. Electronically Signed   By: Margarette Canada M.D.   On: 02/02/2018  19:30    Micro  Results     No results found for this or any previous visit (from the past 240 hour(s)).     Today   Subjective    Lorraine [name] today has no new complaints, daughter at bedside, questions answered          Patient has been seen and examined prior to discharge   Objective   Blood pressure 98/62, pulse 80, temperature 97.8 F (36.6 C), resp. rate 17, height 5' (1.524 m), weight 81.6 kg (180 lb), SpO2 98 %.   Intake/Output Summary (Last 24 hours) at 02/25/2018 1129 Last data filed at 02/25/2018 0935 Gross per 24 hour  Intake 118 ml  Output 650 ml  Net -532 ml    Exam Gen:- Awake Alert,  In no apparent distress  HEENT:- [place].AT, No sclera icterus Neck-Supple Neck,No JVD,.  Lungs-  CTAB , good air movement CV- S1, S2 normal Abd-  +ve B.Sounds, Abd Soft, No tenderness,    Extremity/Skin:- No  edema,   good pulses Psych-affect is flat, oriented x3 Neuro-left-sided hemiparesis persist, unchanged neuro exam from prior documented exam     Data Review   CBC w Diff:  Lab Results  Component Value Date   WBC 10.1 02/17/2018   HGB 13.3 02/17/2018   HCT 39.0 02/17/2018   PLT 298 02/17/2018   LYMPHOPCT 45 02/17/2018   BANDSPCT 0 03/30/2009   MONOPCT 6 02/17/2018   EOSPCT 1 02/17/2018   BASOPCT 0 02/17/2018    CMP:  Lab Results  Component Value Date   NA 138 02/17/2018   K 3.7 02/17/2018   CL 104 02/17/2018   CO2 24 02/17/2018   BUN 16 02/17/2018   CREATININE 0.60 02/17/2018   PROT 6.5 02/17/2018   ALBUMIN 3.6 02/17/2018   BILITOT 0.6 02/17/2018   ALKPHOS 82 02/17/2018   AST 18 02/17/2018   ALT 21 02/17/2018  .   Total Discharge time is about 33 minutes  Roxan Hockey M.D on 02/25/2018 at 11:29 AM  [facility]   Office  [phone]  Voice Recognition Viviann Spare dictation system was used to create this note, attempts have been made to correct errors. Please contact the author with questions and/or clarifications.

## 2018-02-25 NOTE — Care Management Note (Addendum)
Case Management Note  Patient Details  Name: Lorraine [name] MRN: [id number] Date of Birth: 1986-03-17  Subjective/Objective:   Presented with Left -sided weakness concerning for stroke versus conversion disorder. Non english speaking pt from home with husband and 2 daughters( [place] speaking).   Antonio Carlis Stable (family friend),English speaking, [phone].   02/25/2018 @ 92 CHARITY approved by [facility] for home health services per liaison.  Action/Plan: Transition to home today with home health services pending approval from [place]. Pt without insurance, no PCP. NCM scheduled post hospital appointment for 03/06/2017 @ the [facility] @ 8:30 am.Home health can't begin until f/u appointment is made 2/2 pt without PCP, no MD to sign off orders. Follow-up psychiatric info in patient's After Visit Summary, [facility] [place].    Pt  states will need transportation assistance to home. States can't fit in husband's car, too small. Pt without social security #,and no payor source. [facility] states unable to assist with transportation services to home. NCM to make pt aware, maybe family / friends can assist.  Expected Discharge Date:    03/08/2018             Expected Discharge Plan:  [place]  In-House Referral:  Clinical Social Work  Discharge planning Services  CM Consult , follow up appointment scheduled, indigent clinic, [facility] Post Acute Care Choice:    Choice offered to:     DME Arranged:   BSC,Wheelchair DME Agency:   [place], charity case, pending approval  HH Arranged:  RN, Social Work, PT [employer] Agency:  [employer], charity case, approval pending  Status of Service:  completed  If discussed at [employer], dates discussed:    Additional Comments:  Sharin Mons, RN 02/25/2018, 10:29 AM

## 2018-02-26 ENCOUNTER — Ambulatory Visit: Payer: Self-pay | Admitting: Licensed Clinical Social Worker

## 2018-02-26 DIAGNOSIS — F419 Anxiety disorder, unspecified: Principal | ICD-10-CM

## 2018-02-26 DIAGNOSIS — F329 Major depressive disorder, single episode, unspecified: Secondary | ICD-10-CM

## 2018-02-26 NOTE — Telephone Encounter (Signed)
Husband called would like test results
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
## 2018-02-28 NOTE — Telephone Encounter (Signed)
Lupin IgE return from [facility] laboratories, and was slightly elevated at 0.64.  Lupin is a grain that is found in many processed foods as well as some breads.  It is often used in a gluten-free varieties of carbohydrates.  This is not a high IgE level, but could explain some of her symptoms.  I would recommend that she read labels carefully to avoid exposure of this grain.  I do have an article about this emerging food allergy, which I will print off and sent to the family.  Unfortunately, it is only in [place].  We are still waiting on the urine studies.  I talked to Brittini with [facility], and she tells me that she never returned the containers of urine. Please remind the patient to do this.   Salvatore Marvel, MD All[facility] and [place] [facility] [place]

## 2018-03-03 NOTE — Telephone Encounter (Addendum)
Spoke to husband advised of results. Husband verbalized understanding will pick up info in regards to lupin. Info placed up front will pick up 03/04/18 at noon. Patient's husband states patient cannot walk and will not be able to obtain urine. Once she is better she will bring in urine sample

## 2018-03-03 NOTE — Telephone Encounter (Signed)
Late entry called patient on 02/28/18 at both numbers provided no answer left message to return call or would attempt on Monday 03/03/18.

## 2018-03-05 ENCOUNTER — Emergency Department (HOSPITAL_COMMUNITY): Payer: Self-pay

## 2018-03-05 ENCOUNTER — Other Ambulatory Visit: Payer: Self-pay

## 2018-03-05 ENCOUNTER — Emergency Department (HOSPITAL_COMMUNITY)
Admission: EM | Admit: 2018-03-05 | Discharge: 2018-03-05 | Disposition: A | Discharge status: Home / Self Care | Payer: Self-pay | Attending: Emergency Medicine | Admitting: Emergency Medicine

## 2018-03-05 ENCOUNTER — Encounter (HOSPITAL_COMMUNITY): Payer: Self-pay | Admitting: Emergency Medicine

## 2018-03-05 DIAGNOSIS — Z79899 Other long term (current) drug therapy: Secondary | ICD-10-CM | POA: Insufficient documentation

## 2018-03-05 DIAGNOSIS — M62838 Other muscle spasm: Secondary | ICD-10-CM | POA: Insufficient documentation

## 2018-03-05 DIAGNOSIS — M545 Low back pain: Secondary | ICD-10-CM | POA: Insufficient documentation

## 2018-03-05 DIAGNOSIS — R200 Anesthesia of skin: Secondary | ICD-10-CM | POA: Insufficient documentation

## 2018-03-05 DIAGNOSIS — R319 Hematuria, unspecified: Secondary | ICD-10-CM | POA: Insufficient documentation

## 2018-03-05 DIAGNOSIS — J45909 Unspecified asthma, uncomplicated: Secondary | ICD-10-CM | POA: Insufficient documentation

## 2018-03-05 DIAGNOSIS — R1032 Left lower quadrant pain: Secondary | ICD-10-CM | POA: Insufficient documentation

## 2018-03-05 DIAGNOSIS — R109 Unspecified abdominal pain: Secondary | ICD-10-CM

## 2018-03-05 LAB — LIPASE, BLOOD: Lipase: 31 U/L (ref 11–51)

## 2018-03-05 LAB — CBC WITH DIFFERENTIAL/PLATELET
BASOS ABS: 0 10*3/uL (ref 0.0–0.1)
Basophils Relative: 0 %
EOS ABS: 0.1 10*3/uL (ref 0.0–0.7)
Eosinophils Relative: 2 %
HEMATOCRIT: 38.3 % (ref 36.0–46.0)
HEMOGLOBIN: 12.5 g/dL (ref 12.0–15.0)
Lymphocytes Relative: 40 %
Lymphs Abs: 2.9 10*3/uL (ref 0.7–4.0)
MCH: 30.3 pg (ref 26.0–34.0)
MCHC: 32.6 g/dL (ref 30.0–36.0)
MCV: 93 fL (ref 78.0–100.0)
Monocytes Absolute: 0.5 10*3/uL (ref 0.1–1.0)
Monocytes Relative: 7 %
NEUTROS ABS: 3.8 10*3/uL (ref 1.7–7.7)
NEUTROS PCT: 51 %
Platelets: 281 10*3/uL (ref 150–400)
RBC: 4.12 MIL/uL (ref 3.87–5.11)
RDW: 12.6 % (ref 11.5–15.5)
WBC: 7.4 10*3/uL (ref 4.0–10.5)

## 2018-03-05 LAB — COMPREHENSIVE METABOLIC PANEL
ALT: 20 U/L (ref 14–54)
AST: 29 U/L (ref 15–41)
Albumin: 4.2 g/dL (ref 3.5–5.0)
Alkaline Phosphatase: 89 U/L (ref 38–126)
Anion gap: 6 (ref 5–15)
BUN: 11 mg/dL (ref 6–20)
CO2: 27 mmol/L (ref 22–32)
Calcium: 9.1 mg/dL (ref 8.9–10.3)
Chloride: 108 mmol/L (ref 101–111)
Creatinine, Ser: 0.91 mg/dL (ref 0.44–1.00)
GFR calc Af Amer: >60 mL/min (ref >60)
GFR calc non Af Amer: >60 mL/min (ref >60)
Glucose, Bld: 88 mg/dL (ref 65–99)
Potassium: 4.6 mmol/L (ref 3.5–5.1)
Sodium: 141 mmol/L (ref 135–145)
Total Bilirubin: 1 mg/dL (ref 0.3–1.2)
Total Protein: 7.3 g/dL (ref 6.5–8.1)

## 2018-03-05 LAB — PREGNANCY, URINE: Preg Test, Ur: NEGATIVE

## 2018-03-05 LAB — URINALYSIS, ROUTINE W REFLEX MICROSCOPIC
Bilirubin Urine: NEGATIVE
Glucose, UA: NEGATIVE mg/dL
Hgb urine dipstick: NEGATIVE
Ketones, ur: NEGATIVE mg/dL
Leukocytes, UA: NEGATIVE
Nitrite: NEGATIVE
Protein, ur: NEGATIVE mg/dL
Specific Gravity, Urine: 1.005 (ref 1.005–1.030)
pH: 8 (ref 5.0–8.0)

## 2018-03-05 MED ORDER — SODIUM CHLORIDE 0.9 % IV BOLUS
1000.0000 mL | Freq: Once | INTRAVENOUS | Status: AC
  Administered 2018-03-05: 1000 mL via INTRAVENOUS

## 2018-03-05 MED ORDER — ONDANSETRON HCL 4 MG/2ML IJ SOLN
4.0000 mg | Freq: Once | INTRAMUSCULAR | Status: AC
  Administered 2018-03-05: 4 mg via INTRAVENOUS
  Filled 2018-03-05: qty 2

## 2018-03-05 MED ORDER — HYDROMORPHONE HCL 1 MG/ML IJ SOLN
0.5000 mg | Freq: Once | INTRAMUSCULAR | Status: AC
Start: 2018-03-05 — End: 2018-03-05
  Administered 2018-03-05: 0.5 mg via INTRAVENOUS
  Filled 2018-03-05: qty 1

## 2018-03-05 MED ORDER — KETOROLAC TROMETHAMINE 15 MG/ML IJ SOLN
15.0000 mg | Freq: Once | INTRAMUSCULAR | Status: AC
  Administered 2018-03-05: 15 mg via INTRAVENOUS
  Filled 2018-03-05: qty 1

## 2018-03-05 NOTE — ED Notes (Signed)
Pt discharge was done with the video interpreter and pt voiced understanding.

## 2018-03-05 NOTE — Discharge Instructions (Addendum)
Motrin and Tylenol as needed as directed for your pain. Warm compresses such as heating pads to your sore muscles for 20 minutes at a time.  Follow-up with your provider as scheduled.  Return to ER for worsening or concerning symptoms.

## 2018-03-05 NOTE — ED Provider Notes (Signed)
[facility] DEPT Provider Note   CSN: [id number] Arrival date & time: 03/05/18  1618     History   Chief Complaint Chief Complaint  Patient presents with  . Numbness    HPI Lorraine [name] is a 32 y.o. female.   32 year old female presents with complaint of painful urination with blood in her urine.  Patient reports left-sided abdominal pain and back pain for the past 3 days, hematuria onset today.  Denies fevers, chills, nausea, vomiting, changes in bowel habits.  Patient was recently admitted to the hospital for left-sided numbness with a normal MRI of her brain and C-spine, no source for her numbness found and this continue to be worked up outpatient.  There are no changes in her left-sided weakness or numbness as of today.  No other complaints or concerns.     Past Medical History:  Diagnosis Date  . Asthma    Inhaler used 01/02/16  . Diverticulitis   . Nausea & vomiting 04/02/2017  . Pancreatitis   . Pre-diabetes   . Pre-diabetes   . Pyelonephritis   . Pyelonephritis     Patient Active Problem List   Diagnosis Date Noted  . Cervical radiculopathy   . Left arm weakness   . Left-sided weakness 02/19/2018  . Weakness 02/19/2018  . Rash 02/09/2018  . Chest pain 02/08/2018  . Acute pancreatitis 04/02/2017  . Abdominal pain, acute, left upper quadrant 04/02/2017  . Intractable nausea and vomiting 04/02/2017  . Asthma in adult 04/02/2017  . Obesity (BMI 30.0-34.9) 04/02/2017  . Fatty infiltration of liver 04/02/2017  . Gastroesophageal reflux disease without esophagitis   . Pre-diabetes 07/15/2015  . Diarrhea   . Hypokalemia   . Bacterial vaginosis   . Pyelonephritis 07/07/2015  . Abdominal pain, right upper quadrant   . Asthma 10/31/2014  . History of preterm delivery 10/22/2014  . Status post repeat low transverse cesarean section 09/06/2014  . Diverticulitis 09/10/2012    Past Surgical History:  Procedure Laterality Date   . CESAREAN SECTION    . CESAREAN SECTION N/A 2006  . CESAREAN SECTION N/A 09/03/2014   Procedure: CESAREAN SECTION;  Surgeon: Guss Bunde, MD;  Location: [facility];  Service: Obstetrics;  Laterality: N/A;  . CHOLECYSTECTOMY    . ESOPHAGOGASTRODUODENOSCOPY (EGD) WITH PROPOFOL Left 04/07/2017   Procedure: ESOPHAGOGASTRODUODENOSCOPY (EGD) WITH PROPOFOL;  Surgeon: Ronnette Juniper, MD;  Location: [facility];  Service: Gastroenterology;  Laterality: Left;  . TUBAL LIGATION       OB History    Gravida  5   Para  3   Term  1   Preterm  2   AB  2   Living  3     SAB  2   TAB      Ectopic      Multiple  1   Live Births  3            Home Medications    Prior to Admission medications   Medication Sig Start Date End Date Taking? Authorizing Provider  albuterol (PROVENTIL HFA;VENTOLIN HFA) 108 (90 Base) MCG/ACT inhaler Inhale 2 puffs into the lungs every 6 (six) hours as needed for wheezing or shortness of breath. 02/25/18  Yes Emokpae, Courage, MD  cetirizine (ZYRTEC) 10 MG tablet Take 10 mg by mouth daily.   Yes [provider]  cyclobenzaprine (FLEXERIL) 10 MG tablet Take 1 tablet (10 mg total) by mouth at bedtime. 02/25/18  Yes Roxan Hockey, MD  FLUoxetine (  PROZAC) 20 MG capsule Take 1 capsule (20 mg total) by mouth daily. 02/26/18  Yes Emokpae, Courage, MD  fluticasone furoate-vilanterol (BREO ELLIPTA) 100-25 MCG/INH AEPB Inhale 1 puff daily and brush teeth and tongue after use. 02/25/18  Yes Emokpae, Courage, MD  montelukast (SINGULAIR) 10 MG tablet Take one tablet once at bedtime 02/25/18  Yes Emokpae, Courage, MD  ranitidine (ZANTAC) 300 MG tablet Take one tablet once daily 02/25/18  Yes Emokpae, Courage, MD  EPINEPHrine 0.3 mg/0.3 mL IJ SOAJ injection Use as directed for severe allergic reaction 02/25/18   Roxan Hockey, MD    Family History Family History  Problem Relation Age of Onset  . Hyperlipidemia Mother   . Diabetes Father   . Diabetes Maternal  Uncle   . Diabetes Paternal Grandmother   . Asthma Daughter   . Allergic rhinitis Neg Hx   . Angioedema Neg Hx   . Eczema Neg Hx   . Immunodeficiency Neg Hx   . Urticaria Neg Hx     Social History Social History   Tobacco Use  . Smoking status: Never Smoker  . Smokeless tobacco: Never Used  Substance Use Topics  . Alcohol use: No  . Drug use: No     Allergies   Lupine bean extract and Morphine   Review of Systems Review of Systems  Constitutional: Negative for chills and fever.  Eyes: Negative for pain and visual disturbance.  Respiratory: Negative for cough and shortness of breath.   Cardiovascular: Negative for chest pain and palpitations.  Gastrointestinal: Positive for abdominal pain. Negative for blood in stool, constipation, diarrhea, nausea and vomiting.  Genitourinary: Positive for decreased urine volume, dysuria, flank pain and hematuria. Negative for frequency.  Musculoskeletal: Positive for back pain. Negative for arthralgias.  Skin: Negative for color change, rash and wound.  Allergic/Immunologic: Negative for immunocompromised state.  Neurological: Negative for seizures and syncope.  Hematological: Does not bruise/bleed easily.  Psychiatric/Behavioral: Negative for confusion.  All other systems reviewed and are negative.    Physical Exam Updated Vital Signs BP 111/68   Pulse 88   Temp 97.8 F (36.6 C) (Oral)   Resp 16   Ht 5' (1.524 m)   Wt 81.6 kg (180 lb)   SpO2 100%   BMI 35.15 kg/m   Physical Exam  Constitutional: She is oriented to person, place, and time. She appears well-developed and well-nourished. No distress.  HENT:  Head: Normocephalic and atraumatic.  Eyes: Conjunctivae are normal.  Neck: Neck supple.  Cardiovascular: Normal rate and regular rhythm.  No murmur heard. Pulmonary/Chest: Effort normal and breath sounds normal. No respiratory distress.  Abdominal: Soft. She exhibits no mass. There is tenderness in the left upper  quadrant and left lower quadrant. There is no rebound, no guarding and no CVA tenderness.  Musculoskeletal: She exhibits no edema.       Back:  Left lower back TTP, mild tenderness to left trapezius area with muscle spasm.  Neurological: She is alert and oriented to person, place, and time.  Skin: Skin is warm and dry. No rash noted.  Psychiatric: She has a normal mood and affect.  Nursing note and vitals reviewed.    ED Treatments / Results  Labs (all labs ordered are listed, but only abnormal results are displayed) Labs Reviewed  URINALYSIS, ROUTINE W REFLEX MICROSCOPIC - Abnormal; Notable for the following components:      Result Value   Color, Urine STRAW (*)    All other components within normal limits  CBC WITH DIFFERENTIAL/PLATELET  COMPREHENSIVE METABOLIC PANEL  PREGNANCY, URINE  LIPASE, BLOOD    EKG None  Radiology Ct Abdomen Pelvis Wo Contrast  Result Date: 03/05/2018 CLINICAL DATA:  32 year old female with new onset microscopic hematuria. Left side numbness and weakness. EXAM: CT ABDOMEN AND PELVIS WITHOUT CONTRAST TECHNIQUE: Multidetector CT imaging of the abdomen and pelvis was performed following the standard protocol without IV contrast. COMPARISON:  CT Abdomen and Pelvis 01/03/2018 and earlier. FINDINGS: Lower chest: Mild lung base atelectasis. No pleural or pericardial effusion. Stable cardiac size at the upper limits of normal. Hepatobiliary: Surgically absent gallbladder as before. Negative noncontrast liver. Pancreas: Negative. Spleen: Negative. Adrenals/Urinary Tract: Normal adrenal glands. Normal noncontrast left kidney.  Normal course of the left ureter. Diminutive and unremarkable urinary bladder. Negative noncontrast right kidney. Negative course of the right ureter. No urologic calculus. Stomach/Bowel: Mild diverticulosis in the descending and sigmoid colon without active inflammation. Otherwise negative large bowel. Normal appendix (coronal image 42).  Negative terminal ileum. No dilated small bowel.  Unremarkable stomach and duodenum. No abdominal free air, free fluid. Vascular/Lymphatic: Vascular patency is not evaluated in the absence of IV contrast. No lymphadenopathy. Reproductive: A left side tubal ligation clip is in stable position. Chronically migrated right side clip suspected, currently anterior to the urinary bladder on series 2, image 90. Negative noncontrast appearance of the uterus and adnexa otherwise. Other: No pelvic free fluid. Musculoskeletal: No acute osseous abnormality identified. IMPRESSION: 1. No urologic calculus or obstructive uropathy. 2. Normal appendix. Mild diverticulosis of the distal colon without active inflammation. 3. Chronically migrated right tubal ligation clip, currently located anterior to the urinary bladder. Electronically Signed   By: Genevie Ann M.D.   On: 03/05/2018 19:08    Procedures Procedures (including critical care time)  Medications Ordered in ED Medications  ketorolac (TORADOL) 15 MG/ML injection 15 mg (has no administration in time range)  sodium chloride 0.9 % bolus 1,000 mL (1,000 mLs Intravenous New Bag/Given 03/05/18 1811)  HYDROmorphone (DILAUDID) injection 0.5 mg (0.5 mg Intravenous Given 03/05/18 1811)  ondansetron (ZOFRAN) injection 4 mg (4 mg Intravenous Given 03/05/18 1811)     Initial Impression / Assessment and Plan / ED Course  I have reviewed the triage vital signs and the nursing notes.  Pertinent labs & imaging results that were available during my care of the patient were reviewed by me and considered in my medical decision making (see chart for details).  Clinical Course as of Mar 05 1946  Wed Mar 05, 7550  404 32 year old female presents with complaint of hematuria, dysuria, left flank pain and left-sided back pain.  Patient states her symptoms started 3 days ago.  She is also followed outpatient for left side weakness and numbness, this is unchanged today.  On exam patient  has tenderness her left trapezius area as well as left lower back.  Review of lab work shows normal lipase, negative pregnancy test, urinalysis negative for UTI and hematuria.  CBC and CMP are unremarkable.  CT does not show any acute findings in the abdomen or pelvis.  Discussed results with patient and her husband with the use of interpreter, recommend Motrin and Tylenol for muscle soreness, heating pad for 20 minutes at a time and follow-up with PCP.  Return to ER for worsening or concerning symptoms.  Patient and husband verbalized understanding of discharge instructions and plan.   [LM]    Clinical Course User Index [LM] Tacy Learn, PA-C    Final Clinical Impressions(s) /  ED Diagnoses   Final diagnoses:  Left flank pain  Muscle spasm    ED Discharge Orders    None       Roque Lias 03/05/18 1947    Drenda Freeze, MD 03/05/18 2049

## 2018-03-05 NOTE — ED Triage Notes (Signed)
Pt verbalizes continued left sided numbness/weakness; seen recently for same; diagnosed with cervical radiculopathy. New onset blood in urine since Sunday.

## 2018-03-06 ENCOUNTER — Telehealth: Payer: Self-pay

## 2018-03-06 ENCOUNTER — Other Ambulatory Visit: Payer: Self-pay

## 2018-03-06 ENCOUNTER — Ambulatory Visit: Payer: Self-pay | Attending: Family Medicine | Admitting: Physician Assistant

## 2018-03-06 VITALS — BP 107/71 | HR 83 | Temp 98.6°F | Resp 16

## 2018-03-06 DIAGNOSIS — Z885 Allergy status to narcotic agent status: Secondary | ICD-10-CM | POA: Insufficient documentation

## 2018-03-06 DIAGNOSIS — R29810 Facial weakness: Secondary | ICD-10-CM | POA: Insufficient documentation

## 2018-03-06 DIAGNOSIS — R531 Weakness: Secondary | ICD-10-CM

## 2018-03-06 DIAGNOSIS — F419 Anxiety disorder, unspecified: Secondary | ICD-10-CM | POA: Insufficient documentation

## 2018-03-06 DIAGNOSIS — J45909 Unspecified asthma, uncomplicated: Secondary | ICD-10-CM | POA: Insufficient documentation

## 2018-03-06 DIAGNOSIS — F449 Dissociative and conversion disorder, unspecified: Secondary | ICD-10-CM

## 2018-03-06 DIAGNOSIS — R200 Anesthesia of skin: Secondary | ICD-10-CM | POA: Insufficient documentation

## 2018-03-06 DIAGNOSIS — F329 Major depressive disorder, single episode, unspecified: Secondary | ICD-10-CM | POA: Insufficient documentation

## 2018-03-06 DIAGNOSIS — Z79899 Other long term (current) drug therapy: Secondary | ICD-10-CM | POA: Insufficient documentation

## 2018-03-06 MED ORDER — EPINEPHRINE 0.3 MG/0.3ML IJ SOAJ: INTRAMUSCULAR | 1 refills | Status: DC

## 2018-03-06 MED ORDER — FENTANYL 75 MCG/HR TD PT72: 75.0000 ug | MEDICATED_PATCH | TRANSDERMAL | 0 refills | Status: DC

## 2018-03-06 NOTE — Progress Notes (Signed)
HFU: Left side weakness Pain left side of head to waist 6/10

## 2018-03-06 NOTE — Progress Notes (Signed)
Lorraine [name]  FGH:[id number]  CVE:[id number]  DOB - [birth year]  Chief Complaint  Patient presents with  . Hospitalization Follow-up       Subjective:   Lorraine [name] is a 32 y.o. female here today for establishment of care. She has a hx of asthma, glucose intolerance, depression mixed with anxiety, diverticular dz and pyelopnephritis.  She has had frequent emergency department or urgent care visits for left-sided facial and body weakness/numbness she was admitted to the hospital 02/18/2018 02/25/2018 for the same.  Her laboratory work-up was nonrevealing.  An MRI of her brain showed no acute process.  A CT scan of the brain showed no acute process.  MRI of the cervical spine showed no acute process.  She was admitted by the internal medicine team with consultation from behavioral health for questionable conversion disorder.  She has had aggressive physical and occupational therapy.  Due to lack of insurance she was not placed for inpatient rehab.  Recommendations are made for outpatient behavioral health and home health physical therapy.  Since discharge there has been no home health treatments.  Her symptoms persist.  She still cannot move her upper or lower left extremity.  Her fingertips are moving a little bit as well as her toes. She cannot walk.  Her appetite is about the same.  She has been compliant with her medications.  She does not smoke.  She does not drink or use illicit drugs.   Of note>recently admitted to [facility] service on 5/24 for rash with chest tightness and anaphylaxis-like episode.   ROS: GEN: denies fever or chills, denies change in weight Skin: denies lesions or rashes HEENT: denies headache, earache, epistaxis, sore throat, or neck pain LUNGS: denies SHOB, dyspnea, PND, orthopnea CV: denies CP or palpitations ABD: denies abd pain, N or V EXT: denies muscle spasms or swelling; no pain in lower ext, no weakness NEURO: + numbness or tingling, denies sz, stroke  or TIA  ALLERGIES: Allergies  Allergen Reactions  . Lupine Bean Extract Rash  . Morphine Rash    PAST MEDICAL HISTORY: Past Medical History:  Diagnosis Date  . Asthma    Inhaler used 01/02/16  . Diverticulitis   . Nausea & vomiting 04/02/2017  . Pancreatitis   . Pre-diabetes   . Pre-diabetes   . Pyelonephritis   . Pyelonephritis     PAST SURGICAL HISTORY: Past Surgical History:  Procedure Laterality Date  . CESAREAN SECTION    . CESAREAN SECTION N/A 2006  . CESAREAN SECTION N/A 09/03/2014   Procedure: CESAREAN SECTION;  Surgeon: Guss Bunde, MD;  Location: [facility];  Service: Obstetrics;  Laterality: N/A;  . CHOLECYSTECTOMY    . ESOPHAGOGASTRODUODENOSCOPY (EGD) WITH PROPOFOL Left 04/07/2017   Procedure: ESOPHAGOGASTRODUODENOSCOPY (EGD) WITH PROPOFOL;  Surgeon: Ronnette Juniper, MD;  Location: [facility];  Service: Gastroenterology;  Laterality: Left;  . TUBAL LIGATION      MEDICATIONS AT HOME: Prior to Admission medications   Medication Sig Start Date End Date Taking? Authorizing Provider  albuterol (PROVENTIL HFA;VENTOLIN HFA) 108 (90 Base) MCG/ACT inhaler Inhale 2 puffs into the lungs every 6 (six) hours as needed for wheezing or shortness of breath. 02/25/18  Yes Emokpae, Courage, MD  cetirizine (ZYRTEC) 10 MG tablet Take 10 mg by mouth daily.   Yes [provider]  cyclobenzaprine (FLEXERIL) 10 MG tablet Take 1 tablet (10 mg total) by mouth at bedtime. 02/25/18  Yes Emokpae, Courage, MD  EPINEPHrine 0.3 mg/0.3 mL IJ SOAJ injection Use as  directed for severe allergic reaction 03/06/18  Yes Ena Dawley, Ekin Pilar S, PA-C  FLUoxetine (PROZAC) 20 MG capsule Take 1 capsule (20 mg total) by mouth daily. 02/26/18  Yes Emokpae, Courage, MD  fluticasone furoate-vilanterol (BREO ELLIPTA) 100-25 MCG/INH AEPB Inhale 1 puff daily and brush teeth and tongue after use. 02/25/18  Yes Emokpae, Courage, MD  montelukast (SINGULAIR) 10 MG tablet Take one tablet once at bedtime 02/25/18  Yes  Emokpae, Courage, MD  ranitidine (ZANTAC) 300 MG tablet Take one tablet once daily 02/25/18  Yes Emokpae, Courage, MD  fentaNYL (DURAGESIC - DOSED MCG/HR) 75 MCG/HR Place 1 patch (75 mcg total) onto the skin every 3 (three) days. 03/06/18   Brayton Caves, PA-C    Family History  Problem Relation Age of Onset  . Hyperlipidemia Mother   . Diabetes Father   . Diabetes Maternal Uncle   . Diabetes Paternal Grandmother   . Asthma Daughter   . Allergic rhinitis Neg Hx   . Angioedema Neg Hx   . Eczema Neg Hx   . Immunodeficiency Neg Hx   . Urticaria Neg Hx    Social-homemaker, 3 kids, no smoking, no ETOH or drugs  Objective:   Vitals:   03/06/18 0837  BP: 107/71  Pulse: 83  Resp: 16  Temp: 98.6 F (37 C)  TempSrc: Oral  SpO2: 100%    Exam General appearance : Awake, alert, not in any distress. Speech Clear. Not toxic looking. Affect flat. HEENT: Atraumatic and Normocephalic, pupils equally reactive to light and accomodation Neck: supple, no JVD. No cervical lymphadenopathy.  Chest:Good air entry bilaterally, no added sounds  CVS: S1 S2 regular, no murmurs.  Extremities: no strength left side upper and lower extremity; flaccid Neurology: Awake alert, and oriented X 3, CN II-XII intact, Non focal Skin:No Rash Wounds:N/A  Data Review Lab Results  Component Value Date   HGBA1C 5.3 02/09/2018   HGBA1C 5.5 02/07/2018   HGBA1C 6.0 (H) 07/09/2015     Assessment & Plan  1. Left sided weakness and numbness  -HHPT (I spoke with Opal Sidles)  -prn pain meds/muscle relaxers 2. ? Conversion D/O  -SW follow up   -referral to behavioral health 3. Depression m/w anxiety  -cont Prozac   Return in about 1 month (around 04/03/2018).  The patient was given clear instructions to go to ER or return to medical center if symptoms don't improve, worsen or new problems develop. The patient verbalized understanding. The patient was told to call to get lab results if they haven't heard anything  in the next week.   Total time spent with patient was 33. Greater than 50 % of this visit was spent face to face counseling and coordinating care regarding risk factor modification, compliance importance and encouragement, education related to hospital info.  This note has been created with Surveyor, quantity. Any transcriptional errors are unintentional.    Zettie Pho, PA-C [facility] [place], [place]   03/06/2018, 9:18 AM

## 2018-03-06 NOTE — Telephone Encounter (Signed)
Call placed to [facility] to check on the status of a home health referral. As noted by Whitman Hero, RN CM on 02/25/2018, Lorraine [name] was approved by [facility] for home health services per liaison. It is also noted that home health can't begin until the patient is seen by PCP and she had an appointment at [facility] this morning. This CM spoke to [place], then [place] and then [place] who stated that she would need to check with [facility] as they do not see a referral.  Call back to this CM # [phone].

## 2018-03-07 ENCOUNTER — Telehealth: Payer: Self-pay | Admitting: General Practice

## 2018-03-07 ENCOUNTER — Telehealth: Payer: Self-pay | Admitting: Licensed Clinical Social Worker

## 2018-03-07 NOTE — Telephone Encounter (Signed)
Call placed to Gardiner Rhyme, case manager, regarding patient's home health. No answer and voicemail full.   Call placed to [facility] [phone] and was transferred to home health department by Thayer Headings. Melissa from the home health department informed me that she called patient yesterday but unfortunately there were no adults in the home that can translate. Patient had her 32 year old child translating. Melissa stated that she couldn't do any explanation or set an appointment since child was interrupting. Melissa also stated that it was stated that patient was not interested in home health.   Call placed to patient [phone] at spouse's number. Spoke to patient in Spanish and informed her the reason why [place] had called her yesterday and explained home health. Also asked patient if she was interested in receiving home health. Patient apologized for the mistranslation and confirmed that she only had child at the house during the time of call. Explained to patient that [place] will call again with interpreter on the line and explain services and get verbal consent for services. Patient understood and asked that they call spouse's phone number.  Call placed to [facility] [phone], home health department, and informed her that patient is interested in home health and will need interpreter to explain services as well as charity when they call. Also informed Melissa that patient can be best contacted at spouse phone number. Melissa understood. Melissa also explained that when nurse goes out to visit patient he/she call the interpreter line in order for patient to understand.

## 2018-03-07 NOTE — Telephone Encounter (Signed)
Call placed to pt utilizing [place], [place] ID#[id number]. LCSWA introduced self and explained role at [place] disclosed that she received a consult from Erline Levine to address behavioral health concerns.   Pt and spouse agreed to schedule appointment for Tuesday, June 25, 19.

## 2018-03-10 ENCOUNTER — Other Ambulatory Visit: Payer: Self-pay | Admitting: Licensed Clinical Social Worker

## 2018-03-11 ENCOUNTER — Telehealth: Payer: Self-pay | Admitting: General Practice

## 2018-03-11 ENCOUNTER — Other Ambulatory Visit: Payer: Self-pay | Admitting: Licensed Clinical Social Worker

## 2018-03-11 ENCOUNTER — Ambulatory Visit: Payer: Self-pay | Attending: Internal Medicine | Admitting: Licensed Clinical Social Worker

## 2018-03-11 DIAGNOSIS — F4323 Adjustment disorder with mixed anxiety and depressed mood: Secondary | ICD-10-CM

## 2018-03-11 NOTE — Telephone Encounter (Signed)
Briefly met with patient regarding home health. Due to language barrier patient was not aware of when nurse will be out to visit her. Patient spouse was present and therefore stated that he had received three calls from [place]. One of the calls was in [place] and then the other two with interpreter. Spouse didn't understand what they needed and he stated that they kept asking him questions about patient.   Call placed to [place] [phone] on speaker phone while patient and family were in the room to find out the status of home visit. Spoke with Melissa (in [place]) and she informed me that assessment has been scheduled for Thursday 03/13/18. Donita will be the nurse visiting patient and she will call either Wednesday night or Thursday morning with the time of visit. Informed patient of this in [place].  During our conversation, patient mentioned that she had experienced numbness about a month ago. When asked if there were any changes in her life style patient stated "no". She said said the same when asked about any changes in her daily stress.   I informed patient and spouse of [facility] system. They both stated that they're not interested as spouse provides transportation. Informed patient of importance of applying for [facility] financial assistance, orange card and blue card as she doesn't have insurance. An appointment was scheduled with Deisy, financial counselor on 03/24/18. Informed patient that Marcello Fennel speaks Spanish and handed her the application. Although patient is aware that she doesn't insurance she doesn't understand why they keep telling her at the hospital that she has Medicaid and even stated that up to date she hasn't received any bills from her hospital/ED visits. Explained to patient that I wasn't sure why that was happening we I printed a document from [place] stating that patient didn't have Medicaid coverage. Explained to patient that Deisy may have answers for her. Patient  was appreciative and had no further questions.

## 2018-03-11 NOTE — BH Specialist Note (Signed)
Integrated Behavioral Health Initial Visit  MRN: [id number] Name: Lorraine [name]  Number of [place] Clinician visits:: 1/6 Session Start time: 11:20 AM  Session End time: 11:50 AM Total time: 30 minutes  Type of Service: [place] Interpretor:Yes.   Interpretor Name and Language: Spanish (ID# [id number])   Warm Hand Off Completed.       SUBJECTIVE: Evelyn Aguinaldo is a 32 y.o. female accompanied by three minor children and Spouse Patient was referred by Erline Levine for behavioral health concerns. Patient reports the following symptoms/concerns: feelings of sadness and worry, difficulty sleeping, low energy, poor appetite, difficulty concentrating, restlessness, and irritability Duration of problem: 1 month; Severity of problem: mild  OBJECTIVE: Mood: Anxious and Affect: Depressed Risk of harm to self or others: No plan to harm self or others  LIFE CONTEXT: Family and Social: Pt receives support from family School/Work: Pt is uninsured Self-Care: Pt denies substance use. She is open to medication management Life Changes: Pt reports recent hospitalizations and ED visits due to medical concerns  GOALS ADDRESSED: Patient will: 1. Reduce symptoms of: anxiety and depression 2. Increase knowledge and/or ability of: coping skills and healthy habits  3. Demonstrate ability to: Increase healthy adjustment to current life circumstances and Increase adequate support systems for patient/family  INTERVENTIONS: Interventions utilized: Supportive Counseling, Psychoeducation and/or Health Education and Link to [employer]  Standardized Assessments completed: GAD-7 and PHQ 2&9  ASSESSMENT: Patient currently experiencing depression and anxiety triggered by recent hospitalizations and ED visits due to medical concerns. She reports feelings of sadness and worry, difficulty sleeping, low energy, poor appetite, difficulty  concentrating, restlessness, and irritability. She receives strong support from family.      Patient may benefit from psychoeducation, psychotherapy, and medication management. LCSWA educated pt on the correlation between one's physical and mental health. Therapeutic strategies were discussed to promote positive feelings, increase sleep hygiene, and decrease anxiety and depression symptoms. She is open to discussing medication management with PCP at next scheduled appointment and scheduling appointment with financial counseling.   PLAN: 1. Follow up with behavioral health clinician on : Pt was encouraged to contact LCSWA if symptoms worsen or fail to improve to schedule behavioral appointments at [facility]. 2. Behavioral recommendations: LCSWA recommends that pt apply healthy coping skills discussed and utilize provided resources. Pt is encouraged to schedule follow up appointment with LCSWA 3. Referral(s): [place] (In Clinic) 4. "From scale of 1-10, how likely are you to follow plan?":   Rebekah Chesterfield, LCSW 03/13/18 5:06 PM

## 2018-03-12 ENCOUNTER — Ambulatory Visit: Payer: Self-pay | Admitting: Internal Medicine

## 2018-03-13 ENCOUNTER — Telehealth: Payer: Self-pay | Admitting: General Practice

## 2018-03-13 NOTE — Telephone Encounter (Signed)
Donita, nurse from [facility], called the office to confirm if patient's provider will be signing orders for patient. Donita expressed that order is for physical therapy, occupational therapy, home health nurse and medical social worker.  Expresed to [place] that referral was placed by hospital staff before patient was discharged and I was informed that patient was eligible for the charity program. Donita understood. I informed Donita that patient was seen by Zettie Pho, PA, as a hospital follow but that patient is scheduled to see a provider in our office on 7/24 to establish care. I informed her that I wasn't sure if Medical director can sign orders until patient is seen but that I would forward the message to her. I also encouraged Donita to contact hospital staff regarding order as well.   Donita asked for a call back at [phone].   Message being forward to covering provider and case manager.

## 2018-03-14 LAB — SPECIMEN STATUS REPORT

## 2018-03-14 LAB — OTHER LAB TEST

## 2018-03-17 ENCOUNTER — Other Ambulatory Visit: Payer: Self-pay

## 2018-03-17 ENCOUNTER — Emergency Department (HOSPITAL_COMMUNITY)
Admission: EM | Admit: 2018-03-17 | Discharge: 2018-03-17 | Disposition: A | Discharge status: Home / Self Care | Payer: Self-pay | Attending: Emergency Medicine | Admitting: Emergency Medicine

## 2018-03-17 ENCOUNTER — Encounter (HOSPITAL_COMMUNITY): Payer: Self-pay

## 2018-03-17 ENCOUNTER — Ambulatory Visit (INDEPENDENT_AMBULATORY_CARE_PROVIDER_SITE_OTHER): Payer: Self-pay | Admitting: Allergy & Immunology

## 2018-03-17 ENCOUNTER — Encounter: Payer: Self-pay | Admitting: Allergy & Immunology

## 2018-03-17 VITALS — BP 120/78 | HR 95 | Resp 18

## 2018-03-17 DIAGNOSIS — J45909 Unspecified asthma, uncomplicated: Secondary | ICD-10-CM | POA: Insufficient documentation

## 2018-03-17 DIAGNOSIS — Z79899 Other long term (current) drug therapy: Secondary | ICD-10-CM | POA: Insufficient documentation

## 2018-03-17 DIAGNOSIS — J4540 Moderate persistent asthma, uncomplicated: Secondary | ICD-10-CM

## 2018-03-17 DIAGNOSIS — T7840XA Allergy, unspecified, initial encounter: Secondary | ICD-10-CM | POA: Insufficient documentation

## 2018-03-17 DIAGNOSIS — T7800XD Anaphylactic reaction due to unspecified food, subsequent encounter: Secondary | ICD-10-CM

## 2018-03-17 DIAGNOSIS — L509 Urticaria, unspecified: Secondary | ICD-10-CM | POA: Insufficient documentation

## 2018-03-17 LAB — I-STAT BETA HCG BLOOD, ED (MC, WL, AP ONLY): I-stat hCG, quantitative: <5 m[IU]/mL (ref <5)

## 2018-03-17 MED ORDER — CETIRIZINE HCL 10 MG PO TABS: 10.0000 mg | ORAL_TABLET | Freq: Every day | ORAL | 5 refills | Status: DC

## 2018-03-17 MED ORDER — ACETAMINOPHEN 325 MG PO TABS
650.0000 mg | ORAL_TABLET | Freq: Once | ORAL | Status: AC
  Administered 2018-03-17: 650 mg via ORAL
  Filled 2018-03-17: qty 2

## 2018-03-17 MED ORDER — FAMOTIDINE IN NACL 20-0.9 MG/50ML-% IV SOLN
20.0000 mg | Freq: Once | INTRAVENOUS | Status: AC
Start: 2018-03-17 — End: 2018-03-17
  Administered 2018-03-17: 20 mg via INTRAVENOUS
  Filled 2018-03-17: qty 50

## 2018-03-17 MED ORDER — FLUTICASONE PROPIONATE 50 MCG/ACT NA SUSP: 2.0000 | Freq: Every day | NASAL | 5 refills | Status: DC

## 2018-03-17 MED ORDER — FLUTICASONE FUROATE-VILANTEROL 100-25 MCG/INH IN AEPB: INHALATION_SPRAY | RESPIRATORY_TRACT | 3 refills | Status: DC

## 2018-03-17 MED ORDER — DIPHENHYDRAMINE HCL 50 MG/ML IJ SOLN
25.0000 mg | Freq: Once | INTRAMUSCULAR | Status: AC
Start: 2018-03-17 — End: 2018-03-17
  Administered 2018-03-17: 25 mg via INTRAVENOUS
  Filled 2018-03-17: qty 1

## 2018-03-17 MED ORDER — ALBUTEROL SULFATE HFA 108 (90 BASE) MCG/ACT IN AERS: 2.0000 | INHALATION_SPRAY | Freq: Four times a day (QID) | RESPIRATORY_TRACT | 1 refills | Status: DC | PRN

## 2018-03-17 MED ORDER — EPINEPHRINE 0.3 MG/0.3ML IJ SOAJ: 0.3000 mg | Freq: Once | INTRAMUSCULAR | 0 refills | Status: AC

## 2018-03-17 MED ORDER — METHYLPREDNISOLONE SODIUM SUCC 125 MG IJ SOLR
125.0000 mg | Freq: Once | INTRAMUSCULAR | Status: AC
Start: 2018-03-17 — End: 2018-03-17
  Administered 2018-03-17: 125 mg via INTRAVENOUS
  Filled 2018-03-17: qty 2

## 2018-03-17 MED ORDER — FAMOTIDINE 20 MG PO TABS: 20.0000 mg | ORAL_TABLET | Freq: Two times a day (BID) | ORAL | 0 refills | Status: DC

## 2018-03-17 MED ORDER — OMEPRAZOLE 20 MG PO CPDR: 20.0000 mg | DELAYED_RELEASE_CAPSULE | Freq: Every day | ORAL | 5 refills | Status: DC

## 2018-03-17 MED ORDER — PREDNISONE 10 MG (21) PO TBPK: ORAL_TABLET | Freq: Every day | ORAL | 0 refills | Status: DC

## 2018-03-17 MED ORDER — SODIUM CHLORIDE 0.9 % IV BOLUS
1000.0000 mL | Freq: Once | INTRAVENOUS | Status: AC
  Administered 2018-03-17: 1000 mL via INTRAVENOUS

## 2018-03-17 MED ORDER — EPINEPHRINE 0.3 MG/0.3ML IJ SOAJ
0.3000 mg | Freq: Once | INTRAMUSCULAR | Status: AC
Start: 2018-03-17 — End: 2018-03-17
  Administered 2018-03-17: 0.3 mg via INTRAMUSCULAR
  Filled 2018-03-17: qty 0.3

## 2018-03-17 MED ORDER — DIPHENHYDRAMINE HCL 25 MG PO TABS: 25.0000 mg | ORAL_TABLET | Freq: Four times a day (QID) | ORAL | 0 refills | Status: DC

## 2018-03-17 NOTE — Discharge Instructions (Signed)
Please call the number I provided you for financial assistance with obtaining your EpiPen. If you use the EpiPen for any sensation of throat swelling, lip swelling, call 911 immediately afterwards. Begin taking the prednisone, Pepcid and Benadryl tomorrow to help with the symptoms. Follow-up with your PCP and allergy specialist. Return to ED for worsening symptoms, trouble breathing or trouble swallowing, worsening rash, chest pain.  Por favor llame al nmero que le proporcion para obtener ayuda financiera con la obtencin de su EpiPen. Si utiliza el EpiPen para cualquier sensacin de hinchazn de la garganta, hinchazn de los labios, llame al 911 inmediatamente despus. Comience a tomar la prednisona, Pepcid y Benadryl maana para ayudar con los sntomas. Seguimiento con su PCP y Lobbyist. Regrese a la disfuncin respiratoria para empeorar los sntomas, dificultad para respirar o dificultad para tragar, empeoramiento de la erupcin cutnea, dolor en el pecho.

## 2018-03-17 NOTE — Progress Notes (Signed)
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 She continues to have a multitude of comorbid conditions, but her ED visits and hospitalizations have not been related to her anaphylaxis episodes. She has been started on psychiatric medications and is being set up with counseling, which I think will help tremendously. We still need to have the urine studies collected, but her workup thus far has been only notable for a slightly elevated IgE to lupin, therefore this is all we have to go on at this point. We will refer to an RD to help her with avoidance of accidental exposures. This will also allow time for her other comorbid conditions to be addressed and managed.   Plan/Recommendations:   1. 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 - We will work on getting you an epinephrine auto-injector Wynona Luna). - We will refer you to see a Registered Dietician to discuss avoidance of this food allergen. - They should call you next week to confirm that appointment.  - Try to collect the urine samples when you are able (urine jug provided).   2. Seasonal allergic rhinitis (grasses, weeds, trees) - Start taking Flonase (fluticasone) two sprays per nostril once daily to help with mucous production and itchy throat. - The cetirizine should also be helping with the itchy throat as well.    3. Moderate persistent asthma - Lung function looks good today. - Continue with Breo one puff once daily. - Continue with albuterol 4 puffs every 4-6 hours as needed.   4. Chest pain - possibly reflux - I am unsure about why you are having this chest pain, but given the location I will start a proton pump inhibitor: omeprazole 20mg  once  daily.  - We will see if this help with your symptoms.    5. Return in about 6 months (around 09/17/2018).  Subjective:   Lorraine [name] is a 32 y.o. female presenting today for follow up of  Chief Complaint  Patient presents with  . Follow-up    itching with rash   . Cough    Lorraine [name] has a history of the following: Patient Active Problem List   Diagnosis Date Noted  . Cervical radiculopathy   . Left arm weakness   . Left-sided weakness 02/19/2018  . Weakness 02/19/2018  . Rash 02/09/2018  . Chest pain 02/08/2018  . Acute pancreatitis 04/02/2017  . Abdominal pain, acute, left upper quadrant 04/02/2017  . Intractable nausea and vomiting 04/02/2017  . Asthma in adult 04/02/2017  . Obesity (BMI 30.0-34.9) 04/02/2017  . Fatty infiltration of liver 04/02/2017  . Gastroesophageal reflux disease without esophagitis   . Pre-diabetes 07/15/2015  . Diarrhea   . Hypokalemia   . Bacterial vaginosis   . Pyelonephritis 07/07/2015  . Abdominal pain, right upper quadrant   . Asthma 10/31/2014  . History of preterm delivery 10/22/2014  . Status post repeat low transverse cesarean section 09/06/2014  . Diverticulitis 09/10/2012    History obtained from: chart review and patient.  Lorraine [name]'s Primary Care Provider is Patient, No Pcp Per.     Lorraine [name] is a 32 y.o. female presenting for a follow up visit.  She was last seen in May 2019.  At that time, she had been in  the ER multiple occasions for anaphylaxis of an unknown trigger.  She also has a history of chronic abdominal pain, anxiety, and depression which did cloud her clinical picture.  We obtained quite a few labs including tryptase, inflammatory markers, alpha gal panel, hereditary angioedema labs, and a lupin IgE.  We also obtain an environmental allergy panel and an extended mold panel which showed positives to grasses, weeds, and trees.  We did get a stinging insect panel which was borderline positive to honeybee  but otherwise negative.  She did have a mildly elevated antithyroglobulin antibody.  Thyroid stimulating hormone level was normal 3 weeks ago.  The lupin IgE was mildly elevated, therefore we recommended that she avoid possible sources of this.  In the interim, she was seen by behavioral health social worker in late June.  It was recommended that she undergo counseling with psychoeducation.  It was felt that she would benefit from psychotherapy as well as medications.  Since the last visit, she has continued to have some problems. From an asthma perspective, she has actually done fairly well. She remains on the [place]. She has not needed her rescue medication in quite some time. ACT is 17, indicating subpar asthma control.   She last had a reaction was a few weeks ago, prior to her seeing [place] for the first visit. She does have a chronic itchy throat. She is on the cetirizine twice daily. She does take it but by the next day her symptoms return. She had a rash two weeks ago, which has since resolved.   She did get admitted to the hospital for left sided weakness. A workup including imaging was normal. This was attributed to cervical radiculopathy. Neurology service felt that this was likely conversion disorder. She is being discharged with physical therapy.   Otherwise, there have been no changes to her past medical history, surgical history, family history, or social history.    Review of Systems: a 14-point review of systems is pertinent for what is mentioned in HPI.  Otherwise, all other systems were negative. Constitutional: negative other than that listed in the HPI Eyes: negative other than that listed in the HPI Ears, nose, mouth, throat, and face: negative other than that listed in the HPI Respiratory: negative other than that listed in the HPI Cardiovascular: negative other than that listed in the HPI Gastrointestinal: negative other than that listed in the HPI Genitourinary: negative other  than that listed in the HPI Integument: negative other than that listed in the HPI Hematologic: negative other than that listed in the HPI Musculoskeletal: negative other than that listed in the HPI Neurological: negative other than that listed in the HPI Allergy/Immunologic: negative other than that listed in the HPI    Objective:   Blood pressure 120/78, pulse 95, resp. rate 18, last menstrual period 02/19/2018, SpO2 99 %. There is no height or weight on file to calculate BMI.   Physical Exam:  General: Alert, interactive, in no acute distress. Quiet but overall looks better today compared to previous evaluations.  Eyes: No conjunctival injection bilaterally, no discharge on the right, no discharge on the left and no Horner-Trantas dots present. PERRL bilaterally. EOMI without pain. No photophobia.  Ears: Right TM pearly gray with normal light reflex, Left TM pearly gray with normal light reflex, Right TM intact without perforation and Left TM intact without perforation.  Nose/Throat: External nose within normal limits and septum midline. Turbinates edematous and pale with clear discharge. Posterior oropharynx erythematous with  cobblestoning in the posterior oropharynx. Tonsils 2+ without exudates.  Tongue without thrush. Lungs: Clear to auscultation without wheezing, rhonchi or rales. No increased work of breathing. CV: Normal S1/S2. No murmurs. Capillary refill <2 seconds.  Skin: Warm and dry, without lesions or rashes. Neuro:   Grossly intact. No focal deficits appreciated. Responsive to questions.  Diagnostic studies:   Spirometry: results normal (FEV1: 2.44/87%, FVC: 2.65/81%, FEV1/FVC: 92%).    Spirometry consistent with normal pattern.   Allergy Studies: none     Salvatore Marvel, MD  Allergy and [place] [facility] [place]

## 2018-03-17 NOTE — ED Triage Notes (Addendum)
Walk in reports having rash, allergic reaction since 1600

## 2018-03-17 NOTE — Patient Instructions (Addendum)
1. 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 - We will work on getting you an epinephrine auto-injector Wynona Luna). - We will refer you to see a Registered Dietician to discuss avoidance of this food allergen. - They should call you next week to confirm that appointment.  - Try to collect the urine samples when you are able (urine jug provided).   2. Seasonal allergic rhinitis (grasses, weeds, trees) - Start taking Flonase (fluticasone) two sprays per nostril once daily to help with mucous production and itchy throat. - The cetirizine should also be helping with the itchy throat as well.    3. Moderate persistent asthma - Lung function looks good today. - Continue with Breo one puff once daily. - Continue with albuterol 4 puffs every 4-6 hours as needed.   4. Chest pain - possibly reflux - I am unsure about why you are having this chest pain, but given the location I will start a proton pump inhibitor: omeprazole 20mg  once daily.  - We will see if this help with your symptoms.    5. Return in about 6 months (around 09/17/2018).   Please inform us of any Emergency Department visits, hospitalizations, or changes in symptoms. Call us before going to the ED for breathing or allergy symptoms since we might be able to fit you in for a sick visit. Feel free to contact us anytime with any questions, problems, or concerns.  It was a pleasure to see you again today!  Websites that have reliable patient information: 1. American Academy of Asthma, Allergy, and Immunology: www.aaaai.org 2. Food Allergy Research and Education (FARE): foodallergy.org 3. Mothers of Asthmatics: http://www.asthmacommunitynetwork.org 4. American College of Allergy, Asthma, and Immunology: MonthlyElectricBill.co.uk    Make sure you are registered to vote!

## 2018-03-17 NOTE — ED Provider Notes (Signed)
[facility] DEPT Provider Note   CSN: [id number] Arrival date & time: 03/17/18  1722     History   Chief Complaint Chief Complaint  Patient presents with  . Allergic Reaction  . Rash    HPI Lorraine [name] is a 32 y.o. 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  States that symptoms began approximately an hour and a half prior to arrival here.  States that she ate some rice for lunch.  She was seen and evaluated at the allergy center approximately 1 hour prior to her lunch with unremarkable findings.  She states that this is happened to her before.  She did not take any medicine to help with her symptoms.  She is unsure what triggered the symptoms.  Denies any chest pain, prior cardiac history, lip swelling, sick contacts with similar symptoms, any new soaps, lotions, detergents or food ingestions.  HPI  Past Medical History:  Diagnosis Date  . Angio-edema   . Asthma    Inhaler used 01/02/16  . Diverticulitis   . Nausea & vomiting 04/02/2017  . Pancreatitis   . Pre-diabetes   . Pre-diabetes   . Pyelonephritis   . Pyelonephritis   . Urticaria     Patient Active Problem List   Diagnosis Date Noted  . Cervical radiculopathy   . Left arm weakness   . Left-sided weakness 02/19/2018  . Weakness 02/19/2018  . Rash 02/09/2018  . Chest pain 02/08/2018  . Acute pancreatitis 04/02/2017  . Abdominal pain, acute, left upper quadrant 04/02/2017  . Intractable nausea and vomiting 04/02/2017  . Asthma in adult 04/02/2017  . Obesity (BMI 30.0-34.9) 04/02/2017  . Fatty infiltration of liver 04/02/2017  . Gastroesophageal reflux disease without esophagitis   . Pre-diabetes 07/15/2015  . Diarrhea   . Hypokalemia   . Bacterial vaginosis   . Pyelonephritis 07/07/2015  . Abdominal pain, right upper quadrant   . Asthma  10/31/2014  . History of preterm delivery 10/22/2014  . Status post repeat low transverse cesarean section 09/06/2014  . Diverticulitis 09/10/2012    Past Surgical History:  Procedure Laterality Date  . CESAREAN SECTION    . CESAREAN SECTION N/A 2006  . CESAREAN SECTION N/A 09/03/2014   Procedure: CESAREAN SECTION;  Surgeon: Guss Bunde, MD;  Location: [facility];  Service: Obstetrics;  Laterality: N/A;  . CHOLECYSTECTOMY    . ESOPHAGOGASTRODUODENOSCOPY (EGD) WITH PROPOFOL Left 04/07/2017   Procedure: ESOPHAGOGASTRODUODENOSCOPY (EGD) WITH PROPOFOL;  Surgeon: Ronnette Juniper, MD;  Location: [facility];  Service: Gastroenterology;  Laterality: Left;  . TUBAL LIGATION       OB History    Gravida  5   Para  3   Term  1   Preterm  2   AB  2   Living  3     SAB  2   TAB      Ectopic      Multiple  1   Live Births  3            Home Medications    Prior to Admission medications   Medication Sig Start Date End Date Taking? Authorizing Provider  cetirizine (ZYRTEC) 10 MG tablet Take 1 tablet (10 mg total) by mouth daily. 03/17/18  Yes Valentina Shaggy, MD  cyclobenzaprine (FLEXERIL) 10 MG tablet Take 1 tablet (10 mg total) by  mouth at bedtime. 02/25/18  Yes Emokpae, Courage, MD  FLUoxetine (PROZAC) 20 MG capsule Take 1 capsule (20 mg total) by mouth daily. 02/26/18  Yes Emokpae, Courage, MD  fluticasone furoate-vilanterol (BREO ELLIPTA) 100-25 MCG/INH AEPB Inhale 1 puff daily and brush teeth and tongue after use. 03/17/18  Yes Valentina Shaggy, MD  ibuprofen (ADVIL,MOTRIN) 200 MG tablet Take 400 mg by mouth daily as needed (back pain).   Yes [provider]  montelukast (SINGULAIR) 10 MG tablet Take one tablet once at bedtime 02/25/18  Yes Emokpae, Courage, MD  ranitidine (ZANTAC) 300 MG tablet Take one tablet once daily 02/25/18  Yes Emokpae, Courage, MD  albuterol (PROVENTIL HFA;VENTOLIN HFA) 108 (90 Base) MCG/ACT inhaler Inhale 2 puffs into the lungs every  6 (six) hours as needed for wheezing or shortness of breath. Patient not taking: Reported on 03/17/2018 03/17/18   Valentina Shaggy, MD  diphenhydrAMINE (BENADRYL) 25 MG tablet Take 1 tablet (25 mg total) by mouth every 6 (six) hours. 03/17/18   Jaxie Racanelli, PA-C  EPINEPHrine 0.3 mg/0.3 mL IJ SOAJ injection Inject 0.3 mLs (0.3 mg total) into the muscle once for 1 dose. If you use, please call 911 immediately after. 03/17/18 03/17/18  Audery Wassenaar, PA-C  famotidine (PEPCID) 20 MG tablet Take 1 tablet (20 mg total) by mouth 2 (two) times daily. 03/17/18   Olson Lucarelli, PA-C  fentaNYL (DURAGESIC - DOSED MCG/HR) 75 MCG/HR Place 1 patch (75 mcg total) onto the skin every 3 (three) days. Patient not taking: Reported on 03/17/2018 03/06/18   Brayton Caves, PA-C  fluticasone [facility]) 50 MCG/ACT nasal spray Place 2 sprays into both nostrils daily. Patient not taking: Reported on 03/17/2018 03/17/18   Valentina Shaggy, MD  omeprazole (PRILOSEC) 20 MG capsule Take 1 capsule (20 mg total) by mouth daily. Patient not taking: Reported on 03/17/2018 03/17/18   Valentina Shaggy, MD  predniSONE (STERAPRED UNI-PAK 21 TAB) 10 MG (21) TBPK tablet Take by mouth daily. Take 6 tabs by mouth daily  for 2 days, then 5 tabs for 2 days, then 4 tabs for 2 days, then 3 tabs for 2 days, 2 tabs for 2 days, then 1 tab by mouth daily for 2 days 03/17/18   Delia Heady, PA-C    Family History Family History  Problem Relation Age of Onset  . Hyperlipidemia Mother   . Diabetes Father   . Diabetes Maternal Uncle   . Diabetes Paternal Grandmother   . Asthma Daughter   . Allergic rhinitis Neg Hx   . Angioedema Neg Hx   . Eczema Neg Hx   . Immunodeficiency Neg Hx   . Urticaria Neg Hx     Social History Social History   Tobacco Use  . Smoking status: Never Smoker  . Smokeless tobacco: Never Used  Substance Use Topics  . Alcohol use: No  . Drug use: No     Allergies   Lupine bean extract and Morphine   Review of  Systems Review of Systems  Constitutional: Negative for appetite change, chills and fever.  HENT: Negative for ear pain, rhinorrhea, sneezing and sore throat.   Eyes: Negative for photophobia and visual disturbance.  Respiratory: Positive for shortness of breath. Negative for cough, chest tightness and wheezing.   Cardiovascular: Negative for chest pain and palpitations.  Gastrointestinal: Negative for abdominal pain, blood in stool, constipation, diarrhea, nausea and vomiting.  Genitourinary: Negative for dysuria, hematuria and urgency.  Musculoskeletal: Negative for myalgias.  Skin:  Positive for rash.  Neurological: Negative for dizziness, weakness and light-headedness.     Physical Exam Updated Vital Signs BP 123/66   Pulse 68   Temp 98.4 F (36.9 C) (Oral)   Resp (!) 24   Ht 5' (1.524 m)   LMP 02/19/2018 (Exact Date)   SpO2 100%   BMI 35.15 kg/m   Physical Exam  Constitutional: She appears well-developed and well-nourished. No distress.  No signs of angioedema, anaphylaxis noted.  Patient appears anxious.  HENT:  Head: Normocephalic and atraumatic.  Nose: Nose normal.  Eyes: Conjunctivae and EOM are normal. Right eye exhibits no discharge. Left eye exhibits no discharge. No scleral icterus.  Neck: Normal range of motion. Neck supple.  Cardiovascular: Regular rhythm, normal heart sounds and intact distal pulses. Tachycardia present. Exam reveals no gallop and no friction rub.  No murmur heard. Pulmonary/Chest: Effort normal and breath sounds normal. Tachypnea noted. No respiratory distress.  Abdominal: Soft. Bowel sounds are normal. She exhibits no distension. There is no tenderness. There is no guarding.  Musculoskeletal: Normal range of motion. She exhibits no edema.  Neurological: She is alert. She exhibits normal muscle tone. Coordination normal.  Skin: Skin is warm and dry. Rash noted.  Mild, scattered urticarial rash to bilateral upper extremities.  Psychiatric:  She has a normal mood and affect.  Nursing note and vitals reviewed.    ED Treatments / Results  Labs (all labs ordered are listed, but only abnormal results are displayed) Labs Reviewed  I-STAT BETA HCG BLOOD, ED ([facility], [facility], [facility] ONLY)    EKG EKG Interpretation  Date/Time:  Monday March 17 2018 18:08:26 EDT Ventricular Rate:  112 PR Interval:    QRS Duration: 66 QT Interval:  351 QTC Calculation: 480 R Axis:   39 Text Interpretation:  Sinus tachycardia Probable left atrial enlargement Low voltage, precordial leads RSR' in V1 or V2, right VCD or RVH T wave abnormality Abnormal ekg Confirmed by Carmin Muskrat [id number]) on 03/17/2018 9:37:34 PM   Radiology No results found.  Procedures Procedures (including critical care time)  CRITICAL CARE Performed by: Delia Heady   Total critical care time: 45 minutes  Critical care time was exclusive of separately billable procedures and treating other patients.  Critical care was necessary to treat or prevent imminent or life-threatening deterioration.  Critical care was time spent personally by me on the following activities: development of treatment plan with patient and/or surrogate as well as nursing, discussions with consultants, evaluation of patient's response to treatment, examination of patient, obtaining history from patient or surrogate, ordering and performing treatments and interventions, ordering and review of laboratory studies, ordering and review of radiographic studies, pulse oximetry and re-evaluation of patient's condition.   Medications Ordered in ED Medications  diphenhydrAMINE (BENADRYL) injection 25 mg (25 mg Intravenous Given 03/17/18 1806)  EPINEPHrine (EPI-PEN) injection 0.3 mg (0.3 mg Intramuscular Given 03/17/18 1806)  methylPREDNISolone sodium succinate (SOLU-MEDROL) 125 mg/2 mL injection 125 mg (125 mg Intravenous Given 03/17/18 1806)  famotidine (PEPCID) IVPB 20 mg premix (0 mg Intravenous Stopped 03/17/18 1836)    sodium chloride 0.9 % bolus 1,000 mL (0 mLs Intravenous Stopped 03/17/18 2123)  acetaminophen (TYLENOL) tablet 650 mg (650 mg Oral Given 03/17/18 2123)     Initial Impression / Assessment and Plan / ED Course  I have reviewed the triage vital signs and the nursing notes.  Pertinent labs & imaging results that were available during my care of the patient were reviewed by me and considered  in my medical decision making (see chart for details).  Clinical Course as of Mar 17 2248  Mon Mar 17, 2018  1949 Patient sleeping comfortably. Will continue to observe.   [HK]  2127 Patient continuing to rest comfortably.  Vital signs remained stable.  Reports throat irritation so will give Tylenol.  No trismus, drooling or signs of respiratory distress noted.   [HK]    Clinical Course User Index [HK] Delia Heady, PA-C    32 year old female with a past medical history of asthma, known allergy to lupine beans, presents to ED for evaluation of urticaria, sensation and numbness in lips, throat irritation.  Symptoms began 4 hours after she ate lunch.  She denies eating any of the beans.  She saw her allergy specialist this morning for a check up.  Patient has been seen and evaluated here last month with similar symptoms.  At that time she needed to be admitted for ongoing symptoms.  Of note, her symptoms have been subjective each time.  I do not see any lip swelling or changes in voice.  She does have a slight urticarial rash.  Normal phonation.  She does appear anxious.  However, due to the sensation that she is been experiencing, she was given EpiPen, Solu-Medrol, Benadryl and Pepcid.  She was observed for 4 hours with significant improvement in her symptoms.  I had a discussion with the patient whether she preferred being admitted for monitoring of any worsening symptoms especially since she had a history of similar in the past.  She states that she is concerned because the EpiPen is expensive and she is unable  to afford it.  I spoke to the pharmacist who was able to get information about obtaining a financial assistance card to help with the EpiPen.  Patient is comfortable with this plan and states that she does not need to be admitted to the hospital.  Will discharge home with prednisone, antihistamines and follow-up with allergist.  Advised to return to ED for any severe worsening symptoms.  Portions of this note were generated with Lobby[employer]. Dictation errors may occur despite best attempts at proofreading.   Final Clinical Impressions(s) / ED Diagnoses   Final diagnoses:  Allergic reaction, initial encounter    ED Discharge Orders        Ordered    EPINEPHrine 0.3 mg/0.3 mL IJ SOAJ injection   Once     03/17/18 2246    predniSONE (STERAPRED UNI-PAK 21 TAB) 10 MG (21) TBPK tablet  Daily     03/17/18 2246    famotidine (PEPCID) 20 MG tablet  2 times daily     03/17/18 2246    diphenhydrAMINE (BENADRYL) 25 MG tablet  Every 6 hours     03/17/18 2246       Delia Heady, PA-C 03/17/18 2253    Davonna Belling, MD 03/17/18 2332

## 2018-03-18 ENCOUNTER — Ambulatory Visit (HOSPITAL_COMMUNITY)
Admission: EM | Admit: 2018-03-18 | Discharge: 2018-03-18 | Disposition: A | Discharge status: Home / Self Care | Payer: Self-pay | Attending: Physician Assistant | Admitting: Physician Assistant

## 2018-03-18 ENCOUNTER — Emergency Department (HOSPITAL_COMMUNITY)
Admission: EM | Admit: 2018-03-18 | Discharge: 2018-03-18 | Disposition: A | Discharge status: Home / Self Care | Payer: Self-pay | Attending: Emergency Medicine | Admitting: Emergency Medicine

## 2018-03-18 ENCOUNTER — Other Ambulatory Visit: Payer: Self-pay | Admitting: Physician Assistant

## 2018-03-18 ENCOUNTER — Encounter (HOSPITAL_COMMUNITY): Payer: Self-pay | Admitting: Emergency Medicine

## 2018-03-18 ENCOUNTER — Encounter: Payer: Self-pay | Admitting: Allergy & Immunology

## 2018-03-18 DIAGNOSIS — J45909 Unspecified asthma, uncomplicated: Secondary | ICD-10-CM | POA: Insufficient documentation

## 2018-03-18 DIAGNOSIS — T7840XA Allergy, unspecified, initial encounter: Secondary | ICD-10-CM | POA: Insufficient documentation

## 2018-03-18 DIAGNOSIS — L298 Other pruritus: Secondary | ICD-10-CM

## 2018-03-18 DIAGNOSIS — R110 Nausea: Secondary | ICD-10-CM | POA: Insufficient documentation

## 2018-03-18 DIAGNOSIS — Z79899 Other long term (current) drug therapy: Secondary | ICD-10-CM | POA: Insufficient documentation

## 2018-03-18 DIAGNOSIS — R070 Pain in throat: Secondary | ICD-10-CM

## 2018-03-18 MED ORDER — DIPHENHYDRAMINE HCL 50 MG/ML IJ SOLN
25.0000 mg | Freq: Once | INTRAMUSCULAR | Status: AC
  Administered 2018-03-18: 25 mg via INTRAVENOUS

## 2018-03-18 MED ORDER — SODIUM CHLORIDE 0.9 % IV SOLN
INTRAVENOUS | Status: DC
  Administered 2018-03-18: 21:00:00 via INTRAVENOUS

## 2018-03-18 MED ORDER — EPINEPHRINE 0.3 MG/0.3ML IJ SOAJ
0.3000 mg | Freq: Once | INTRAMUSCULAR | Status: AC
Start: 2018-03-18 — End: 2018-03-18
  Administered 2018-03-18: 0.3 mg via INTRAMUSCULAR
  Filled 2018-03-18: qty 0.3

## 2018-03-18 MED ORDER — EPINEPHRINE 0.3 MG/0.3ML IJ SOAJ: 0.3000 mg | Freq: Once | INTRAMUSCULAR | 0 refills | Status: AC

## 2018-03-18 MED ORDER — DIPHENHYDRAMINE HCL 50 MG/ML IJ SOLN
INTRAMUSCULAR | Status: AC
  Filled 2018-03-18: qty 1

## 2018-03-18 MED ORDER — METHYLPREDNISOLONE SODIUM SUCC 125 MG IJ SOLR
INTRAMUSCULAR | Status: AC
  Filled 2018-03-18: qty 2

## 2018-03-18 MED ORDER — METHYLPREDNISOLONE SODIUM SUCC 125 MG IJ SOLR
125.0000 mg | Freq: Once | INTRAMUSCULAR | Status: AC
  Administered 2018-03-18: 125 mg via INTRAVENOUS
  Filled 2018-03-18: qty 2

## 2018-03-18 MED ORDER — PREDNISONE 20 MG PO TABS: 40.0000 mg | ORAL_TABLET | Freq: Every day | ORAL | 0 refills | Status: DC

## 2018-03-18 MED ORDER — FAMOTIDINE IN NACL 20-0.9 MG/50ML-% IV SOLN
20.0000 mg | Freq: Once | INTRAVENOUS | Status: AC
Start: 2018-03-18 — End: 2018-03-18
  Administered 2018-03-18: 20 mg via INTRAVENOUS
  Filled 2018-03-18: qty 50

## 2018-03-18 MED ORDER — HYDROXYZINE HCL 25 MG PO TABS: 25.0000 mg | ORAL_TABLET | Freq: Four times a day (QID) | ORAL | 0 refills | Status: DC

## 2018-03-18 MED ORDER — METHYLPREDNISOLONE SODIUM SUCC 125 MG IJ SOLR
125.0000 mg | Freq: Once | INTRAMUSCULAR | Status: AC
  Administered 2018-03-18: 125 mg via INTRAVENOUS

## 2018-03-18 MED ORDER — DIPHENHYDRAMINE HCL 50 MG/ML IJ SOLN
25.0000 mg | Freq: Once | INTRAMUSCULAR | Status: AC
  Administered 2018-03-18: 25 mg via INTRAVENOUS
  Filled 2018-03-18: qty 1

## 2018-03-18 NOTE — Telephone Encounter (Signed)
Lorraine [name] pt will be establishing care with you on 7/24

## 2018-03-18 NOTE — ED Provider Notes (Signed)
Patient placed in Quick Look pathway, seen and evaluated   Chief Complaint: rash, itching all over  HPI:   Lorraine [name] is a 32 y.o. female who presents to the ED with rash, itching and swelling.to face neck, arms. The symptoms started 3 hours prior to arrival to the ED. Patient seen in the ED yesterday and treated for allergic reaction. Patient was given Rx for Epi pen but states it was to expensive and did not get it. She did get  Patient sent to the ED from Urgent Care today for additional treatment for allergic reaction. Patient reports she sees an allergist and is allergic to lupin. The first time patient was here yesterday she ate chicken and beans and the beans did have lupin. Patient states she did not know what it was and she has not had appointment with the lady to tell her what foods she can eat. Patient ate the same thing today and the symptoms started again  ROS: Skin: rash, itching, throat swelling  Physical Exam:  BP (!) 144/77 (BP Location: Right Arm)   Pulse 85   Temp 98.7 F (37.1 C) (Oral)   Resp 18   LMP 02/19/2018 (Exact Date)   SpO2 99%    Gen: No distress  Neuro: Awake and Alert  Skin: rash, mild facial swelling   ENT: Uvula is midline, there is swelling of the throat, no difficulty swallowing. There is swelling of the lips noted.   Resp: no distress Epi pen given. Initiation of care has begun. The patient has been counseled on the process, plan, and necessity for staying for the completion/evaluation, and the remainder of the medical screening examination    Ashley Murrain, NP 03/18/18 2034    Carmin Muskrat, MD 03/18/18 2224

## 2018-03-18 NOTE — Discharge Instructions (Signed)
As discussed, your evaluation today has been largely reassuring.  But, it is important that you monitor your condition carefully, and do not hesitate to return to the ED if you develop new, or concerning changes in your condition. ? ?Otherwise, please follow-up with your physician for appropriate ongoing care. ? ?

## 2018-03-18 NOTE — Care Management (Signed)
ED CM received consult concerning patient who has been to the ED 3 in the past 3 days for allergic reactions. Patient given a prescription for an Epipen but unable to fill due to cost. Patient is uninsured, patient is active with [facility] and was told that it would take up to 4 weeks before the medication assistance program would be processed. Discussed MATCH medication assistance, reinstated patient and printed letter and handed it to patient with instructions, and explained via Spanish interpreter. Updated Dr. Vanita Panda EDP on Pod B. No further ED CM needs identified.

## 2018-03-18 NOTE — ED Provider Notes (Signed)
03/18/2018 6:29 PM   DOB: Jul 21, 1986 / MRN: [id number]  SUBJECTIVE:  Lorraine [name] is a 32 y.o. female presenting for itching, throat tightness started about 30 minutes ago and is worsening.  Patient was seen in the ED for same yesterday and was given epinephrine.  She is allergic to lupine bean extract and morphine.   She  has a past medical history of Angio-edema, Asthma, Diverticulitis, Nausea & vomiting (04/02/2017), Pancreatitis, Pre-diabetes, Pre-diabetes, Pyelonephritis, Pyelonephritis, and Urticaria.    She  reports that she has never smoked. She has never used smokeless tobacco. She reports that she does not drink alcohol or use drugs. She  reports that she currently engages in sexual activity. She reports using the following method of birth control/protection: Surgical. The patient  has a past surgical history that includes Cholecystectomy; Cesarean section; Cesarean section (N/A, 2006); Cesarean section (N/A, 09/03/2014); Tubal ligation; and Esophagogastroduodenoscopy (egd) with propofol (Left, 04/07/2017).  Her family history includes Asthma in her daughter; Diabetes in her father, maternal uncle, and paternal grandmother; Hyperlipidemia in her mother.  Review of Systems  Constitutional: Negative for chills, diaphoresis, fever, malaise/fatigue and weight loss.  HENT: Negative for sore throat.   Respiratory: Negative for cough and wheezing.   Cardiovascular: Negative for chest pain.  Genitourinary: Negative for dysuria.  Musculoskeletal: Negative for myalgias.  Skin: Positive for itching and rash.  Neurological: Negative for dizziness.    OBJECTIVE:  Pulse 87   Temp 98.2 F (36.8 C) (Oral)   Resp (!) 22   Wt 180 lb (81.6 kg)   LMP 02/19/2018 (Exact Date)   SpO2 100%   BMI 35.15 kg/m   Wt Readings from Last 3 Encounters:  03/18/18 180 lb (81.6 kg)  03/05/18 180 lb (81.6 kg)  02/21/18 180 lb (81.6 kg)   Temp Readings from Last 3 Encounters:  03/18/18 98.2 F (36.8  C) (Oral)  03/17/18 98.2 F (36.8 C) (Oral)  03/06/18 98.6 F (37 C) (Oral)   BP Readings from Last 3 Encounters:  03/17/18 107/72  03/17/18 120/78  03/06/18 107/71   Pulse Readings from Last 3 Encounters:  03/18/18 87  03/17/18 72  03/17/18 95    Physical Exam  Constitutional: She is oriented to person, place, and time. She appears well-developed and well-nourished. She appears distressed.  HENT:  Head: Normocephalic.  Right Ear: External ear normal.  Left Ear: External ear normal.  Nose: Nose normal.  Mouth/Throat: Oropharynx is clear and moist. No oropharyngeal exudate.  Eyes: Pupils are equal, round, and reactive to light. EOM are normal. Right eye exhibits no discharge. Left eye exhibits no discharge. No scleral icterus.  Neck: Normal range of motion. Neck supple. No thyromegaly present.  Cardiovascular: Normal rate, regular rhythm, S1 normal, S2 normal, normal heart sounds and intact distal pulses. Exam reveals no gallop, no friction rub and no decreased pulses.  No murmur heard. Pulmonary/Chest: Effort normal and breath sounds normal. No stridor. No respiratory distress. She has no wheezes. She has no rales. She exhibits no tenderness.  Abdominal: She exhibits no distension.  Musculoskeletal: She exhibits no edema, tenderness or deformity.  Neurological: She is alert and oriented to person, place, and time. No cranial nerve deficit. Gait normal.  Skin: Skin is dry. Rash (Macular erythematous rash about neck and face, patient seen constantly rubbing these areas during my interview.) noted. She is not diaphoretic.  Psychiatric: Her mood appears anxious.  Vitals reviewed.   Results for orders placed or performed during the hospital encounter of  03/17/18 (from the past 72 hour(s))  I-Stat beta hCG blood, ED     Status: None   Collection Time: 03/17/18  6:12 PM  Result Value Ref Range   I-stat hCG, quantitative <5.0 <5 mIU/mL   Comment 3            Comment:   GEST. AGE       CONC.  (mIU/mL)   <=1 WEEK        5 - 50     2 WEEKS       50 - 500     3 WEEKS       100 - 10,000     4 WEEKS     1,000 - 30,000        FEMALE AND NON-PREGNANT FEMALE:     LESS THAN 5 mIU/mL     No results found.  ASSESSMENT AND PLAN:   Allergic reaction, initial encounter: Patient with normal vital signs and without lip throat or tongue swelling.  We gave the patient 125 Solu-Medrol IV along with Benadryl 25 mg.  No change in her exam she continues to rub her throat and face and chest.  Offered an ENT referral but advised this would take days.  She tells me she will go back to the emergency department for further work-up.  Of note she has been admitted in the past for this and is seeing allergy.  Psychogenic diagnosis should be considered.   Discharge Instructions     Please continue the medications you are taking from the emergency room.  I am adding on a medication called hydroxyzine which should be helpful for your symptoms.  It is important that she take it 3 times a day.        The patient is advised to call or return to clinic if she does not see an improvement in symptoms, or to seek the care of the closest emergency department if she worsens with the above plan.   Philis Fendt, MHS, PA-C 03/18/2018 6:29 PM   Tereasa Coop, PA-C 03/18/18 1909

## 2018-03-18 NOTE — ED Triage Notes (Addendum)
Patient was seen in ED yesterday for same; states that her throat is closing and she is itching all over. Was given Epi and steroids yesterday also. Patient had the same meal yesterday and today; chicken, beans and tortilla - patient has allergy to Lupin per allergist d/c paperwork.

## 2018-03-18 NOTE — Discharge Instructions (Signed)
Please continue the medications you are taking from the emergency room.  I am adding on a medication called hydroxyzine which should be helpful for your symptoms.  It is important that she take it 3 times a day.

## 2018-03-18 NOTE — ED Provider Notes (Signed)
[facility] EMERGENCY DEPARTMENT Provider Note   CSN: [id number] Arrival date & time: 03/18/18  2007     History   Chief Complaint Chief Complaint  Patient presents with  . Allergic Reaction    HPI Lorraine [name] is a 32 y.o. female.  HPI Patient presents with concern of throat fullness, itchiness, difficulty breathing, nausea. Patient has multiple medical issues, has been seen here multiple times in the past few weeks, and was seen at our affiliated facility yesterday after similar illness. 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 However, she ate beans earlier today. She subsequently developed some scratchiness of her throat, itchiness, with a full sensation, without pain, without syncope, without other notable changes. Patient went to urgent care, received Solu-Medrol, Benadryl, and was sent here for evaluation. She is here with her husband who assists with the HPI, which was obtained with the use of a translator. Past Medical History:  Diagnosis Date  . Angio-edema   . Asthma    Inhaler used 01/02/16  . Diverticulitis   . Nausea & vomiting 04/02/2017  . Pancreatitis   . Pre-diabetes   . Pre-diabetes   . Pyelonephritis   . Pyelonephritis   . Urticaria     Patient Active Problem List   Diagnosis Date Noted  . Cervical radiculopathy   . Left arm weakness   . Left-sided weakness 02/19/2018  . Weakness 02/19/2018  . Rash 02/09/2018  . Chest pain 02/08/2018  . Acute pancreatitis 04/02/2017  . Abdominal pain, acute, left upper quadrant 04/02/2017  . Intractable nausea and vomiting 04/02/2017  . Asthma in adult 04/02/2017  . Obesity (BMI 30.0-34.9) 04/02/2017  . Fatty infiltration of liver 04/02/2017  . Gastroesophageal reflux disease without esophagitis   . Pre-diabetes 07/15/2015  . Diarrhea   . Hypokalemia   . Bacterial vaginosis   . Pyelonephritis 07/07/2015  .  Abdominal pain, right upper quadrant   . Asthma 10/31/2014  . History of preterm delivery 10/22/2014  . Status post repeat low transverse cesarean section 09/06/2014  . Diverticulitis 09/10/2012    Past Surgical History:  Procedure Laterality Date  . CESAREAN SECTION    . CESAREAN SECTION N/A 2006  . CESAREAN SECTION N/A 09/03/2014   Procedure: CESAREAN SECTION;  Surgeon: Guss Bunde, MD;  Location: [facility];  Service: Obstetrics;  Laterality: N/A;  . CHOLECYSTECTOMY    . ESOPHAGOGASTRODUODENOSCOPY (EGD) WITH PROPOFOL Left 04/07/2017   Procedure: ESOPHAGOGASTRODUODENOSCOPY (EGD) WITH PROPOFOL;  Surgeon: Ronnette Juniper, MD;  Location: [facility];  Service: Gastroenterology;  Laterality: Left;  . TUBAL LIGATION       OB History    Gravida  5   Para  3   Term  1   Preterm  2   AB  2   Living  3     SAB  2   TAB      Ectopic      Multiple  1   Live Births  3            Home Medications    Prior to Admission medications   Medication Sig Start Date End Date Taking? Authorizing Provider  albuterol (PROVENTIL HFA;VENTOLIN HFA) 108 (90 Base) MCG/ACT inhaler Inhale 2 puffs into the lungs every 6 (six) hours as needed for wheezing or shortness of breath. Patient not taking: Reported on 03/17/2018 03/17/18   Valentina Shaggy, MD  cetirizine Alethia Berthold)  10 MG tablet Take 1 tablet (10 mg total) by mouth daily. 03/17/18   Valentina Shaggy, MD  cyclobenzaprine (FLEXERIL) 10 MG tablet Take 1 tablet (10 mg total) by mouth at bedtime. 02/25/18   Roxan Hockey, MD  diphenhydrAMINE (BENADRYL) 25 MG tablet Take 1 tablet (25 mg total) by mouth every 6 (six) hours. 03/17/18   Khatri, Hina, PA-C  EPINEPHrine 0.3 mg/0.3 mL IJ SOAJ injection Inject 0.3 mLs (0.3 mg total) into the muscle once for 1 dose. 03/18/18 03/18/18  Carmin Muskrat, MD  famotidine (PEPCID) 20 MG tablet Take 1 tablet (20 mg total) by mouth 2 (two) times daily. 03/17/18   Khatri, Hina, PA-C  FLUoxetine (PROZAC) 20 MG  capsule Take 1 capsule (20 mg total) by mouth daily. 02/26/18   Emokpae, Courage, MD  fluticasone furoate-vilanterol (BREO ELLIPTA) 100-25 MCG/INH AEPB Inhale 1 puff daily and brush teeth and tongue after use. 03/17/18   Valentina Shaggy, MD  hydrOXYzine (ATARAX/VISTARIL) 25 MG tablet Take 1 tablet (25 mg total) by mouth every 6 (six) hours. 03/18/18   Tereasa Coop, PA-C  ibuprofen (ADVIL,MOTRIN) 200 MG tablet Take 400 mg by mouth daily as needed (back pain).    [provider]  montelukast (SINGULAIR) 10 MG tablet Take one tablet once at bedtime 02/25/18   Roxan Hockey, MD  predniSONE (DELTASONE) 20 MG tablet Take 2 tablets (40 mg total) by mouth daily with breakfast. For the next four days 03/18/18   Carmin Muskrat, MD  ranitidine (ZANTAC) 300 MG tablet Take one tablet once daily 02/25/18   Roxan Hockey, MD    Family History Family History  Problem Relation Age of Onset  . Hyperlipidemia Mother   . Diabetes Father   . Diabetes Maternal Uncle   . Diabetes Paternal Grandmother   . Asthma Daughter   . Allergic rhinitis Neg Hx   . Angioedema Neg Hx   . Eczema Neg Hx   . Immunodeficiency Neg Hx   . Urticaria Neg Hx     Social History Social History   Tobacco Use  . Smoking status: Never Smoker  . Smokeless tobacco: Never Used  Substance Use Topics  . Alcohol use: No  . Drug use: No     Allergies   Lupine bean extract and Morphine   Review of Systems Review of Systems  Constitutional:       Per HPI, otherwise negative  HENT:       Per HPI, otherwise negative  Respiratory:       Per HPI, otherwise negative  Cardiovascular:       Per HPI, otherwise negative  Gastrointestinal: Negative for vomiting.  Endocrine:       Negative aside from HPI  Genitourinary:       Neg aside from HPI   Musculoskeletal:       Per HPI, otherwise negative  Skin: Negative.   Neurological: Negative for syncope.  Psychiatric/Behavioral: The patient is nervous/anxious.       Physical Exam Updated Vital Signs BP 133/64   Pulse (!) 133   Temp 98.7 F (37.1 C) (Oral)   Resp 18   LMP 02/19/2018 (Exact Date)   SpO2 100%   Physical Exam  Constitutional: She is oriented to person, place, and time. She appears well-developed and well-nourished. No distress.  Anxious appearing obese young female awake and alert  HENT:  Head: Normocephalic and atraumatic.  Eyes: Conjunctivae and EOM are normal.  Cardiovascular: Normal rate and regular rhythm.  Pulmonary/Chest:  Effort normal and breath sounds normal. No stridor. No respiratory distress.  Abdominal: She exhibits no distension.  Musculoskeletal: She exhibits no edema.  Neurological: She is alert and oriented to person, place, and time. No cranial nerve deficit.  Skin: Skin is warm and dry.  Psychiatric: Her mood appears anxious.  Nursing note and vitals reviewed.    ED Treatments / Results   Procedures Procedures (including critical care time)  Medications Ordered in ED Medications  0.9 %  sodium chloride infusion ( Intravenous New Bag/Given 03/18/18 2052)  EPINEPHrine (EPI-PEN) injection 0.3 mg (0.3 mg Intramuscular Given 03/18/18 2034)  famotidine (PEPCID) IVPB 20 mg premix (20 mg Intravenous New Bag/Given 03/18/18 2052)  methylPREDNISolone sodium succinate (SOLU-MEDROL) 125 mg/2 mL injection 125 mg (125 mg Intravenous Given 03/18/18 2038)  diphenhydrAMINE (BENADRYL) injection 25 mg (25 mg Intravenous Given 03/18/18 2039)     Initial Impression / Assessment and Plan / ED Course  I have reviewed the triage vital signs and the nursing notes.  Pertinent labs & imaging results that were available during my care of the patient were reviewed by me and considered in my medical decision making (see chart for details).  On repeat exam the patient is in no distress.  I discussed the findings with her and her husband, and I discussed patient's case with her social work Medical laboratory scientific officer, to assist with patient obtaining  medication including EpiPen, which she and her husband note has been difficult to obtain secondary to financial status. With no evidence for progression of allergic reaction, no anaphylaxis, no respiratory compromise, no hypoxia, the patient was discharged in stable condition.  Final Clinical Impressions(s) / ED Diagnoses   Final diagnoses:  Allergic reaction, initial encounter    ED Discharge Orders        Ordered    predniSONE (DELTASONE) 20 MG tablet  Daily with breakfast     03/18/18 2220    EPINEPHrine 0.3 mg/0.3 mL IJ SOAJ injection   Once     03/18/18 2221       Carmin Muskrat, MD 03/18/18 2226

## 2018-03-18 NOTE — ED Triage Notes (Signed)
PT reports generalized itching, chest pressure, throat closing sensation that started 30 minutes ago. PT was treated for same in ER yesterday. PT was given epi in ED yesterday.

## 2018-03-24 ENCOUNTER — Ambulatory Visit: Payer: Self-pay

## 2018-03-24 ENCOUNTER — Ambulatory Visit (HOSPITAL_COMMUNITY)
Admission: EM | Admit: 2018-03-24 | Discharge: 2018-03-24 | Disposition: A | Discharge status: Home / Self Care | Payer: Self-pay | Attending: Family Medicine | Admitting: Family Medicine

## 2018-03-24 ENCOUNTER — Encounter (HOSPITAL_COMMUNITY): Payer: Self-pay | Admitting: Emergency Medicine

## 2018-03-24 ENCOUNTER — Ambulatory Visit: Payer: Self-pay | Attending: Nurse Practitioner

## 2018-03-24 DIAGNOSIS — L298 Other pruritus: Secondary | ICD-10-CM

## 2018-03-24 DIAGNOSIS — R070 Pain in throat: Secondary | ICD-10-CM

## 2018-03-24 DIAGNOSIS — T7840XA Allergy, unspecified, initial encounter: Secondary | ICD-10-CM

## 2018-03-24 MED ORDER — PREDNISONE 20 MG PO TABS: 40.0000 mg | ORAL_TABLET | Freq: Every day | ORAL | 0 refills | Status: AC

## 2018-03-24 MED ORDER — METHYLPREDNISOLONE SODIUM SUCC 125 MG IJ SOLR
125.0000 mg | Freq: Once | INTRAMUSCULAR | Status: AC
  Administered 2018-03-24: 125 mg via INTRAMUSCULAR

## 2018-03-24 MED ORDER — FAMOTIDINE 20 MG PO TABS
ORAL_TABLET | ORAL | Status: AC
  Filled 2018-03-24: qty 1

## 2018-03-24 MED ORDER — METHYLPREDNISOLONE SODIUM SUCC 125 MG IJ SOLR
INTRAMUSCULAR | Status: AC
Start: 2018-03-24 — End: ?
  Filled 2018-03-24: qty 2

## 2018-03-24 MED ORDER — FAMOTIDINE 20 MG PO TABS: 20.0000 mg | ORAL_TABLET | Freq: Two times a day (BID) | ORAL | 0 refills | Status: DC

## 2018-03-24 MED ORDER — DIPHENHYDRAMINE HCL 25 MG PO TABS: 25.0000 mg | ORAL_TABLET | Freq: Four times a day (QID) | ORAL | 0 refills | Status: DC

## 2018-03-24 MED ORDER — DIPHENHYDRAMINE HCL 50 MG/ML IJ SOLN
25.0000 mg | Freq: Once | INTRAMUSCULAR | Status: AC
  Administered 2018-03-24: 25 mg via INTRAMUSCULAR

## 2018-03-24 MED ORDER — DIPHENHYDRAMINE HCL 50 MG/ML IJ SOLN
INTRAMUSCULAR | Status: AC
  Filled 2018-03-24: qty 1

## 2018-03-24 MED ORDER — FAMOTIDINE 20 MG PO TABS
20.0000 mg | ORAL_TABLET | Freq: Once | ORAL | Status: AC
  Administered 2018-03-24: 20 mg via ORAL

## 2018-03-24 NOTE — Discharge Instructions (Signed)
Please continue to follow with your allergist as this continues to reoccur for you.  Benadryl every 6 hours until symptoms have resolved. Pepcid twice a day. 5 days of prednisone. If difficulty breathing, swallowing, speaking, facial swelling, throat swelling or drooling please go to the Er.

## 2018-03-24 NOTE — ED Triage Notes (Signed)
Pt sts URI sx and hoarse voice

## 2018-03-24 NOTE — ED Provider Notes (Signed)
[facility]    CSN: [id number] Arrival date & time: 03/24/18  1113     History   Chief Complaint Chief Complaint  Patient presents with  . URI    HPI Lorraine [name] is a 32 y.o. female.   Isatu presents with complaints of itching as well as throat tightening sensation which started today at around noon. Has been experiencing similar, has had to come in and get injections which have helped. 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 Denies any known exposure to these today. No shortness of breath or difficulty swallowing. Feels her voice is hoarse. Has not taken any medications for her symptoms. Per chart review has been seen a few times in the past week for this, multiple times in the past few months, including hospitalization for observation. Hx of asthma, pancreatitis, pre diabetes, gerd    ROS per HPI.      Past Medical History:  Diagnosis Date  . Angio-edema   . Asthma    Inhaler used 01/02/16  . Diverticulitis   . Nausea & vomiting 04/02/2017  . Pancreatitis   . Pre-diabetes   . Pre-diabetes   . Pyelonephritis   . Pyelonephritis   . Urticaria     Patient Active Problem List   Diagnosis Date Noted  . Cervical radiculopathy   . Left arm weakness   . Left-sided weakness 02/19/2018  . Weakness 02/19/2018  . Rash 02/09/2018  . Chest pain 02/08/2018  . Acute pancreatitis 04/02/2017  . Abdominal pain, acute, left upper quadrant 04/02/2017  . Intractable nausea and vomiting 04/02/2017  . Asthma in adult 04/02/2017  . Obesity (BMI 30.0-34.9) 04/02/2017  . Fatty infiltration of liver 04/02/2017  . Gastroesophageal reflux disease without esophagitis   . Pre-diabetes 07/15/2015  . Diarrhea   . Hypokalemia   . Bacterial vaginosis   . Pyelonephritis 07/07/2015  . Abdominal pain, right upper quadrant   . Asthma 10/31/2014  . History of preterm delivery 10/22/2014  . Status post repeat low transverse cesarean  section 09/06/2014  . Diverticulitis 09/10/2012    Past Surgical History:  Procedure Laterality Date  . CESAREAN SECTION    . CESAREAN SECTION N/A 2006  . CESAREAN SECTION N/A 09/03/2014   Procedure: CESAREAN SECTION;  Surgeon: Guss Bunde, MD;  Location: [facility];  Service: Obstetrics;  Laterality: N/A;  . CHOLECYSTECTOMY    . ESOPHAGOGASTRODUODENOSCOPY (EGD) WITH PROPOFOL Left 04/07/2017   Procedure: ESOPHAGOGASTRODUODENOSCOPY (EGD) WITH PROPOFOL;  Surgeon: Ronnette Juniper, MD;  Location: [facility];  Service: Gastroenterology;  Laterality: Left;  . TUBAL LIGATION      OB History    Gravida  5   Para  3   Term  1   Preterm  2   AB  2   Living  3     SAB  2   TAB      Ectopic      Multiple  1   Live Births  3            Home Medications    Prior to Admission medications   Medication Sig Start Date End Date Taking? Authorizing Provider  albuterol (PROVENTIL HFA;VENTOLIN HFA) 108 (90 Base) MCG/ACT inhaler Inhale 2 puffs into the lungs every 6 (six) hours as needed for wheezing or shortness of breath. Patient not taking: Reported on 03/17/2018 03/17/18   Valentina Shaggy, MD  cetirizine (ZYRTEC) 10 MG tablet Take 1 tablet (10 mg total)  by mouth daily. 03/17/18   Valentina Shaggy, MD  cyclobenzaprine (FLEXERIL) 10 MG tablet Take 1 tablet (10 mg total) by mouth at bedtime. 02/25/18   Roxan Hockey, MD  diphenhydrAMINE (BENADRYL) 25 MG tablet Take 1 tablet (25 mg total) by mouth every 6 (six) hours. 03/24/18   Zigmund Gottron, NP  famotidine (PEPCID) 20 MG tablet Take 1 tablet (20 mg total) by mouth 2 (two) times daily for 10 days. 03/24/18 04/03/18  Zigmund Gottron, NP  FLUoxetine (PROZAC) 20 MG capsule Take 1 capsule (20 mg total) by mouth daily. 02/26/18   Emokpae, Courage, MD  fluticasone furoate-vilanterol (BREO ELLIPTA) 100-25 MCG/INH AEPB Inhale 1 puff daily and brush teeth and tongue after use. 03/17/18   Valentina Shaggy, MD  hydrOXYzine  (ATARAX/VISTARIL) 25 MG tablet Take 1 tablet (25 mg total) by mouth every 6 (six) hours. 03/18/18   Tereasa Coop, PA-C  ibuprofen (ADVIL,MOTRIN) 200 MG tablet Take 400 mg by mouth daily as needed (back pain).    [provider]  montelukast (SINGULAIR) 10 MG tablet Take one tablet once at bedtime 02/25/18   Roxan Hockey, MD  predniSONE (DELTASONE) 20 MG tablet Take 2 tablets (40 mg total) by mouth daily with breakfast for 5 days. 03/24/18 03/29/18  Zigmund Gottron, NP  ranitidine (ZANTAC) 300 MG tablet Take one tablet once daily 02/25/18   Roxan Hockey, MD    Family History Family History  Problem Relation Age of Onset  . Hyperlipidemia Mother   . Diabetes Father   . Diabetes Maternal Uncle   . Diabetes Paternal Grandmother   . Asthma Daughter   . Allergic rhinitis Neg Hx   . Angioedema Neg Hx   . Eczema Neg Hx   . Immunodeficiency Neg Hx   . Urticaria Neg Hx     Social History Social History   Tobacco Use  . Smoking status: Never Smoker  . Smokeless tobacco: Never Used  Substance Use Topics  . Alcohol use: No  . Drug use: No     Allergies   Lupine bean extract and Morphine   Review of Systems Review of Systems   Physical Exam Triage Vital Signs ED Triage Vitals [03/24/18 1158]  Enc Vitals Group     BP 124/74     Pulse Rate 77     Resp 18     Temp 98.2 F (36.8 C)     Temp Source Oral     SpO2 100 %     Weight      Height      Head Circumference      Peak Flow      Pain Score      Pain Loc      Pain Edu?      Excl. in [place]?    No data found.  Updated Vital Signs BP 124/74 (BP Location: Left Arm)   Pulse 77   Temp 98.2 F (36.8 C) (Oral)   Resp 18   SpO2 100%   Visual Acuity Right Eye Distance:   Left Eye Distance:   Bilateral Distance:    Right Eye Near:   Left Eye Near:    Bilateral Near:     Physical Exam  Constitutional: She is oriented to person, place, and time. She appears well-developed and well-nourished. No  distress.  HENT:  Head: Normocephalic and atraumatic.  Voice hoarse sounding, states she cannot speak, but speaking to her friend at bedside in soft voice; swallowing  without difficulty; no stridor, no wheezing, no drooling  Cardiovascular: Normal rate, regular rhythm and normal heart sounds.  Pulmonary/Chest: Effort normal and breath sounds normal.  Neurological: She is alert and oriented to person, place, and time.  Skin: Skin is warm and dry.  Scratching at skin to arms and hands, no visible rash present     UC Treatments / Results  Labs (all labs ordered are listed, but only abnormal results are displayed) Labs Reviewed - No data to display  EKG None  Radiology No results found.  Procedures Procedures (including critical care time)  Medications Ordered in UC Medications  methylPREDNISolone sodium succinate (SOLU-MEDROL) 125 mg/2 mL injection 125 mg (125 mg Intramuscular Given 03/24/18 1258)  famotidine (PEPCID) tablet 20 mg (20 mg Oral Given 03/24/18 1259)  diphenhydrAMINE (BENADRYL) injection 25 mg (25 mg Intramuscular Given 03/24/18 1259)    Initial Impression / Assessment and Plan / UC Course  I have reviewed the triage vital signs and the nursing notes.  Pertinent labs & imaging results that were available during my care of the patient were reviewed by me and considered in my medical decision making (see chart for details).     Solu medrol, pepcid, benadryl provided in clinic today. Unknown trigger to cause symptoms today. Frequent recurrent similar episodes. Patient feels improved after solu medrol, pepcid and benadryl in clinic today. No longer scratching on reassessment. Patient has filled epi pen but has not used this. Encouraged follow up with allergy clinic. Return precautions provided. Patient verbalized understanding and agreeable to plan.    Final Clinical Impressions(s) / UC Diagnoses   Final diagnoses:  Allergic reaction, initial encounter     Discharge  Instructions     Please continue to follow with your allergist as this continues to reoccur for you.  Benadryl every 6 hours until symptoms have resolved. Pepcid twice a day. 5 days of prednisone. If difficulty breathing, swallowing, speaking, facial swelling, throat swelling or drooling please go to the Er.     ED Prescriptions    Medication Sig Dispense Auth. Provider   diphenhydrAMINE (BENADRYL) 25 MG tablet Take 1 tablet (25 mg total) by mouth every 6 (six) hours. 20 tablet Augusto Gamble B, NP   famotidine (PEPCID) 20 MG tablet Take 1 tablet (20 mg total) by mouth 2 (two) times daily for 10 days. 20 tablet Augusto Gamble B, NP   predniSONE (DELTASONE) 20 MG tablet Take 2 tablets (40 mg total) by mouth daily with breakfast for 5 days. 10 tablet Zigmund Gottron, NP     Controlled Substance Prescriptions [place] Controlled Substance Registry consulted? Not Applicable   Zigmund Gottron, NP 03/24/18 1339

## 2018-03-26 ENCOUNTER — Encounter: Payer: Self-pay | Admitting: Allergy & Immunology

## 2018-04-09 ENCOUNTER — Ambulatory Visit: Payer: Self-pay | Admitting: Nurse Practitioner

## 2018-05-24 ENCOUNTER — Encounter (HOSPITAL_COMMUNITY): Payer: Self-pay | Admitting: *Deleted

## 2018-05-24 ENCOUNTER — Other Ambulatory Visit: Payer: Self-pay

## 2018-05-24 ENCOUNTER — Emergency Department (HOSPITAL_COMMUNITY)
Admission: EM | Admit: 2018-05-24 | Discharge: 2018-05-25 | Disposition: A | Discharge status: Home / Self Care | Payer: Self-pay | Attending: Emergency Medicine | Admitting: Emergency Medicine

## 2018-05-24 DIAGNOSIS — Z79899 Other long term (current) drug therapy: Secondary | ICD-10-CM | POA: Insufficient documentation

## 2018-05-24 DIAGNOSIS — J45909 Unspecified asthma, uncomplicated: Secondary | ICD-10-CM | POA: Insufficient documentation

## 2018-05-24 DIAGNOSIS — K5792 Diverticulitis of intestine, part unspecified, without perforation or abscess without bleeding: Secondary | ICD-10-CM

## 2018-05-24 DIAGNOSIS — K5732 Diverticulitis of large intestine without perforation or abscess without bleeding: Secondary | ICD-10-CM | POA: Insufficient documentation

## 2018-05-24 NOTE — ED Triage Notes (Signed)
Via spanish interpreter. Pt reports she has had pain in the left side of her abdomen x3 days, associated with nausea and dizziness. Burning with urination. Pt says she has also had all over itching for about an hour. No medication for the same.

## 2018-05-25 LAB — URINALYSIS, ROUTINE W REFLEX MICROSCOPIC
BILIRUBIN URINE: NEGATIVE
Glucose, UA: NEGATIVE mg/dL
Hgb urine dipstick: NEGATIVE
KETONES UR: NEGATIVE mg/dL
Leukocytes, UA: NEGATIVE
NITRITE: NEGATIVE
PH: 7 (ref 5.0–8.0)
PROTEIN: NEGATIVE mg/dL
Specific Gravity, Urine: 1.019 (ref 1.005–1.030)

## 2018-05-25 LAB — COMPREHENSIVE METABOLIC PANEL
ALBUMIN: 4.1 g/dL (ref 3.5–5.0)
ALK PHOS: 94 U/L (ref 38–126)
ALT: 32 U/L (ref 0–44)
AST: 27 U/L (ref 15–41)
Anion gap: 10 (ref 5–15)
BILIRUBIN TOTAL: 0.1 mg/dL — AB (ref 0.3–1.2)
BUN: 20 mg/dL (ref 6–20)
CALCIUM: 9.3 mg/dL (ref 8.9–10.3)
CO2: 26 mmol/L (ref 22–32)
CREATININE: 0.81 mg/dL (ref 0.44–1.00)
Chloride: 108 mmol/L (ref 98–111)
GFR calc Af Amer: >60 mL/min (ref >60)
GLUCOSE: 95 mg/dL (ref 70–99)
Potassium: 3.6 mmol/L (ref 3.5–5.1)
Sodium: 144 mmol/L (ref 135–145)
TOTAL PROTEIN: 7.3 g/dL (ref 6.5–8.1)

## 2018-05-25 LAB — CBC
HEMATOCRIT: 37.9 % (ref 36.0–46.0)
Hemoglobin: 12.5 g/dL (ref 12.0–15.0)
MCH: 30.2 pg (ref 26.0–34.0)
MCHC: 33 g/dL (ref 30.0–36.0)
MCV: 91.5 fL (ref 78.0–100.0)
PLATELETS: 282 10*3/uL (ref 150–400)
RBC: 4.14 MIL/uL (ref 3.87–5.11)
RDW: 12.7 % (ref 11.5–15.5)
WBC: 9.1 10*3/uL (ref 4.0–10.5)

## 2018-05-25 LAB — LIPASE, BLOOD: Lipase: 37 U/L (ref 11–51)

## 2018-05-25 LAB — I-STAT BETA HCG BLOOD, ED (MC, WL, AP ONLY): I-stat hCG, quantitative: <5 m[IU]/mL (ref <5)

## 2018-05-25 MED ORDER — FAMOTIDINE IN NACL 20-0.9 MG/50ML-% IV SOLN
20.0000 mg | Freq: Once | INTRAVENOUS | Status: AC
  Administered 2018-05-25: 20 mg via INTRAVENOUS
  Filled 2018-05-25: qty 50

## 2018-05-25 MED ORDER — ONDANSETRON 4 MG PO TBDP: 4.0000 mg | ORAL_TABLET | Freq: Three times a day (TID) | ORAL | 0 refills | Status: DC | PRN

## 2018-05-25 MED ORDER — AMOXICILLIN-POT CLAVULANATE 875-125 MG PO TABS
1.0000 | ORAL_TABLET | Freq: Once | ORAL | Status: AC
  Administered 2018-05-25: 1 via ORAL
  Filled 2018-05-25: qty 1

## 2018-05-25 MED ORDER — FAMOTIDINE 20 MG PO TABS: 20.0000 mg | ORAL_TABLET | Freq: Two times a day (BID) | ORAL | 0 refills | Status: DC

## 2018-05-25 MED ORDER — ONDANSETRON HCL 4 MG/2ML IJ SOLN
4.0000 mg | Freq: Once | INTRAMUSCULAR | Status: AC
  Administered 2018-05-25: 4 mg via INTRAVENOUS
  Filled 2018-05-25: qty 2

## 2018-05-25 MED ORDER — AMOXICILLIN-POT CLAVULANATE 875-125 MG PO TABS: 1.0000 | ORAL_TABLET | Freq: Two times a day (BID) | ORAL | 0 refills | Status: DC

## 2018-05-25 MED ORDER — DIPHENHYDRAMINE HCL 50 MG/ML IJ SOLN
12.5000 mg | Freq: Once | INTRAMUSCULAR | Status: AC
  Administered 2018-05-25: 12.5 mg via INTRAVENOUS
  Filled 2018-05-25: qty 1

## 2018-05-25 MED ORDER — SODIUM CHLORIDE 0.9 % IV BOLUS
1000.0000 mL | Freq: Once | INTRAVENOUS | Status: AC
  Administered 2018-05-25: 1000 mL via INTRAVENOUS

## 2018-05-25 NOTE — ED Notes (Signed)
Pt aware of urine sample needed, directed to use call light

## 2018-05-25 NOTE — ED Notes (Signed)
Patient ambulatory to restroom  ?

## 2018-05-25 NOTE — ED Provider Notes (Signed)
[facility] DEPT Provider Note   CSN: [id number] Arrival date & time: 05/24/18  2208     History   Chief Complaint Chief Complaint  Patient presents with  . Abdominal Pain    HPI Lorraine [name] is a 32 y.o. female with a past medical history of diverticulosis, asthma, recurrent urticaria who presents to ED for evaluation of 3-day history of left lower and middle abdominal pain.  Describes the pain as sharp.  Associated with nausea and several episodes of diarrhea as well as dysuria.  Has not tried any medications to help with symptoms.  Denies any vaginal complaints or possibility of pregnancy.  She has not tried any medication help with her symptoms.  No sick contacts with similar symptoms.  Denies any recent travel, alcohol, tobacco or other drug use.  Prior abdominal surgeries include cholecystectomy and C-section. Patient also reports itching all over.  She has been seen several times in the past for similar symptoms.  She has not taken any medicine help with her symptoms.  Denies any signs of angioedema or anaphylaxis.  HPI  Past Medical History:  Diagnosis Date  . Angio-edema   . Asthma    Inhaler used 01/02/16  . Diverticulitis   . Nausea & vomiting 04/02/2017  . Pancreatitis   . Pre-diabetes   . Pre-diabetes   . Pyelonephritis   . Pyelonephritis   . Urticaria     Patient Active Problem List   Diagnosis Date Noted  . Cervical radiculopathy   . Left arm weakness   . Left-sided weakness 02/19/2018  . Weakness 02/19/2018  . Rash 02/09/2018  . Chest pain 02/08/2018  . Acute pancreatitis 04/02/2017  . Abdominal pain, acute, left upper quadrant 04/02/2017  . Intractable nausea and vomiting 04/02/2017  . Asthma in adult 04/02/2017  . Obesity (BMI 30.0-34.9) 04/02/2017  . Fatty infiltration of liver 04/02/2017  . Gastroesophageal reflux disease without esophagitis   . Pre-diabetes 07/15/2015  . Diarrhea   . Hypokalemia   . Bacterial  vaginosis   . Pyelonephritis 07/07/2015  . Abdominal pain, right upper quadrant   . Asthma 10/31/2014  . History of preterm delivery 10/22/2014  . Status post repeat low transverse cesarean section 09/06/2014  . Diverticulitis 09/10/2012    Past Surgical History:  Procedure Laterality Date  . CESAREAN SECTION    . CESAREAN SECTION N/A 2006  . CESAREAN SECTION N/A 09/03/2014   Procedure: CESAREAN SECTION;  Surgeon: Guss Bunde, MD;  Location: [facility];  Service: Obstetrics;  Laterality: N/A;  . CHOLECYSTECTOMY    . ESOPHAGOGASTRODUODENOSCOPY (EGD) WITH PROPOFOL Left 04/07/2017   Procedure: ESOPHAGOGASTRODUODENOSCOPY (EGD) WITH PROPOFOL;  Surgeon: Ronnette Juniper, MD;  Location: [facility];  Service: Gastroenterology;  Laterality: Left;  . TUBAL LIGATION       OB History    Gravida  5   Para  3   Term  1   Preterm  2   AB  2   Living  3     SAB  2   TAB      Ectopic      Multiple  1   Live Births  3            Home Medications    Prior to Admission medications   Medication Sig Start Date End Date Taking? Authorizing Provider  albuterol (PROVENTIL HFA;VENTOLIN HFA) 108 (90 Base) MCG/ACT inhaler Inhale 2 puffs into the lungs every 6 (six) hours as needed for wheezing or  shortness of breath. Patient not taking: Reported on 03/17/2018 03/17/18   Valentina Shaggy, MD  amoxicillin-clavulanate (AUGMENTIN) 875-125 MG tablet Take 1 tablet by mouth every 12 (twelve) hours. 05/25/18   Cylus Douville, PA-C  cetirizine (ZYRTEC) 10 MG tablet Take 1 tablet (10 mg total) by mouth daily. 03/17/18   Valentina Shaggy, MD  cyclobenzaprine (FLEXERIL) 10 MG tablet Take 1 tablet (10 mg total) by mouth at bedtime. 02/25/18   Roxan Hockey, MD  diphenhydrAMINE (BENADRYL) 25 MG tablet Take 1 tablet (25 mg total) by mouth every 6 (six) hours. 03/24/18   Zigmund Gottron, NP  famotidine (PEPCID) 20 MG tablet Take 1 tablet (20 mg total) by mouth 2 (two) times daily. 05/25/18   Vivienne Sangiovanni,  Jennifer Payes, PA-C  FLUoxetine (PROZAC) 20 MG capsule Take 1 capsule (20 mg total) by mouth daily. 02/26/18   Emokpae, Courage, MD  fluticasone furoate-vilanterol (BREO ELLIPTA) 100-25 MCG/INH AEPB Inhale 1 puff daily and brush teeth and tongue after use. 03/17/18   Valentina Shaggy, MD  hydrOXYzine (ATARAX/VISTARIL) 25 MG tablet Take 1 tablet (25 mg total) by mouth every 6 (six) hours. 03/18/18   Tereasa Coop, PA-C  ibuprofen (ADVIL,MOTRIN) 200 MG tablet Take 400 mg by mouth daily as needed (back pain).    [provider]  montelukast (SINGULAIR) 10 MG tablet Take one tablet once at bedtime 02/25/18   Emokpae, Courage, MD  ondansetron (ZOFRAN ODT) 4 MG disintegrating tablet Take 1 tablet (4 mg total) by mouth every 8 (eight) hours as needed for nausea or vomiting. 05/25/18   Delia Heady, PA-C  ranitidine (ZANTAC) 300 MG tablet Take one tablet once daily 02/25/18   Roxan Hockey, MD    Family History Family History  Problem Relation Age of Onset  . Hyperlipidemia Mother   . Diabetes Father   . Diabetes Maternal Uncle   . Diabetes Paternal Grandmother   . Asthma Daughter   . Allergic rhinitis Neg Hx   . Angioedema Neg Hx   . Eczema Neg Hx   . Immunodeficiency Neg Hx   . Urticaria Neg Hx     Social History Social History   Tobacco Use  . Smoking status: Never Smoker  . Smokeless tobacco: Never Used  Substance Use Topics  . Alcohol use: No  . Drug use: No     Allergies   Lupine bean extract and Morphine   Review of Systems Review of Systems  Constitutional: Negative for appetite change, chills and fever.  HENT: Negative for ear pain, rhinorrhea, sneezing and sore throat.   Eyes: Negative for photophobia and visual disturbance.  Respiratory: Negative for cough, chest tightness, shortness of breath and wheezing.   Cardiovascular: Negative for chest pain and palpitations.  Gastrointestinal: Positive for abdominal pain, diarrhea and nausea. Negative for blood in stool,  constipation and vomiting.  Genitourinary: Positive for dysuria. Negative for hematuria, urgency, vaginal bleeding and vaginal discharge.  Musculoskeletal: Negative for myalgias.  Skin: Negative for rash.  Neurological: Negative for dizziness, weakness and light-headedness.     Physical Exam Updated Vital Signs BP 130/79   Pulse 86   Temp 98.7 F (37.1 C) (Oral)   Resp 17   Ht 5' (1.524 m)   Wt 81.6 kg   LMP 05/17/2018   SpO2 100%   BMI 35.15 kg/m   Physical Exam  Constitutional: She appears well-developed and well-nourished. No distress.  HENT:  Head: Normocephalic and atraumatic.  Nose: Nose normal.  Eyes: Conjunctivae and EOM  are normal. Left eye exhibits no discharge. No scleral icterus.  Neck: Normal range of motion. Neck supple.  Cardiovascular: Normal rate, regular rhythm, normal heart sounds and intact distal pulses. Exam reveals no gallop and no friction rub.  No murmur heard. Pulmonary/Chest: Effort normal and breath sounds normal. No respiratory distress.  Abdominal: Soft. Bowel sounds are normal. She exhibits no distension. There is tenderness in the left lower quadrant. There is no rebound and no guarding.    Musculoskeletal: Normal range of motion. She exhibits no edema.  Neurological: She is alert. She exhibits normal muscle tone. Coordination normal.  Skin: Skin is warm and dry. No rash noted.  No rash noted.  Psychiatric: She has a normal mood and affect.  Nursing note and vitals reviewed.    ED Treatments / Results  Labs (all labs ordered are listed, but only abnormal results are displayed) Labs Reviewed  COMPREHENSIVE METABOLIC PANEL - Abnormal; Notable for the following components:      Result Value   Total Bilirubin 0.1 (*)    All other components within normal limits  LIPASE, BLOOD  CBC  URINALYSIS, ROUTINE W REFLEX MICROSCOPIC  I-STAT BETA HCG BLOOD, ED ([facility], [facility], [facility] ONLY)    EKG None  Radiology No results  found.  Procedures Procedures (including critical care time)  Medications Ordered in ED Medications  famotidine (PEPCID) IVPB 20 mg premix (0 mg Intravenous Stopped 05/25/18 0121)  diphenhydrAMINE (BENADRYL) injection 12.5 mg (12.5 mg Intravenous Given 05/25/18 0052)  ondansetron (ZOFRAN) injection 4 mg (4 mg Intravenous Given 05/25/18 0052)  sodium chloride 0.9 % bolus 1,000 mL (0 mLs Intravenous Stopped 05/25/18 0205)  amoxicillin-clavulanate (AUGMENTIN) 875-125 MG per tablet 1 tablet (1 tablet Oral Given 05/25/18 0352)     Initial Impression / Assessment and Plan / ED Course  I have reviewed the triage vital signs and the nursing notes.  Pertinent labs & imaging results that were available during my care of the patient were reviewed by me and considered in my medical decision making (see chart for details).     32 year old female with past medical history of diverticulosis presents to ED for evaluation of left middle and left lower quadrant abdominal pain for the past 3 days.  She reports associated nausea, diarrhea and dysuria.  She also complains of diffuse itching.  I had seen and evaluated patient for similar symptoms before.  There is no apparent rash on my exam.  She does begin scratching whenever I enter the room.  There is tenderness palpation of the left side of the abdomen without rebound or guarding noted.  She denies any vaginal complaints.  Nuys any vomiting.  She is afebrile.  Other vital signs remain within normal limits.  Lab work including CBC, lipase, CMP, urinalysis and hCG unremarkable.  Due to location of pain and diarrhea, will treat as diverticulitis flare.  No indication for imaging at this time as this appears uncomplicated and lab work is reassuring.  Patient is able to tolerate p.o. intake without difficulty prior to discharge.  Itching has improved with Pepcid and Benadryl.  Will advise her to return to ED for any severe worsening symptoms.  Portions of this note were  generated with [employer]. Dictation errors may occur despite best attempts at proofreading.   Final Clinical Impressions(s) / ED Diagnoses   Final diagnoses:  Diverticulitis    ED Discharge Orders         Ordered    amoxicillin-clavulanate (AUGMENTIN) 875-125 MG  tablet  Every 12 hours     05/25/18 0336    ondansetron (ZOFRAN ODT) 4 MG disintegrating tablet  Every 8 hours PRN     05/25/18 0336    famotidine (PEPCID) 20 MG tablet  2 times daily     05/25/18 0336           Delia Heady, PA-C 05/25/18 0506    Varney Biles, MD 05/25/18 [phone]

## 2018-05-25 NOTE — ED Notes (Signed)
Patient provided with ginger ale and water and instructed to drink

## 2018-05-25 NOTE — Discharge Instructions (Addendum)
Return to ED for worsening symptoms, vomiting blood, blood in your stool, severe abdominal pain, lightheadedness or loss of consciousness.

## 2018-05-25 NOTE — ED Notes (Signed)
Pt given ginger ale, stated that she did not want anything to eat

## 2018-05-30 ENCOUNTER — Emergency Department (HOSPITAL_COMMUNITY)
Admission: EM | Admit: 2018-05-30 | Discharge: 2018-05-31 | Disposition: A | Discharge status: Home / Self Care | Payer: Self-pay | Attending: Emergency Medicine | Admitting: Emergency Medicine

## 2018-05-30 ENCOUNTER — Encounter (HOSPITAL_COMMUNITY): Payer: Self-pay | Admitting: Emergency Medicine

## 2018-05-30 DIAGNOSIS — J45909 Unspecified asthma, uncomplicated: Secondary | ICD-10-CM | POA: Insufficient documentation

## 2018-05-30 DIAGNOSIS — R1032 Left lower quadrant pain: Secondary | ICD-10-CM | POA: Insufficient documentation

## 2018-05-30 DIAGNOSIS — Z79899 Other long term (current) drug therapy: Secondary | ICD-10-CM | POA: Insufficient documentation

## 2018-05-30 DIAGNOSIS — L299 Pruritus, unspecified: Secondary | ICD-10-CM | POA: Insufficient documentation

## 2018-05-30 LAB — CBC
HEMATOCRIT: 39.1 % (ref 36.0–46.0)
HEMOGLOBIN: 12.5 g/dL (ref 12.0–15.0)
MCH: 29.8 pg (ref 26.0–34.0)
MCHC: 32 g/dL (ref 30.0–36.0)
MCV: 93.3 fL (ref 78.0–100.0)
Platelets: 254 10*3/uL (ref 150–400)
RBC: 4.19 MIL/uL (ref 3.87–5.11)
RDW: 12.3 % (ref 11.5–15.5)
WBC: 9 10*3/uL (ref 4.0–10.5)

## 2018-05-30 LAB — COMPREHENSIVE METABOLIC PANEL
ALBUMIN: 4 g/dL (ref 3.5–5.0)
ALT: 31 U/L (ref 0–44)
ANION GAP: 9 (ref 5–15)
AST: 26 U/L (ref 15–41)
Alkaline Phosphatase: 103 U/L (ref 38–126)
BUN: 15 mg/dL (ref 6–20)
CO2: 24 mmol/L (ref 22–32)
Calcium: 9.3 mg/dL (ref 8.9–10.3)
Chloride: 108 mmol/L (ref 98–111)
Creatinine, Ser: 0.74 mg/dL (ref 0.44–1.00)
GFR calc Af Amer: >60 mL/min (ref >60)
GFR calc non Af Amer: >60 mL/min (ref >60)
GLUCOSE: 108 mg/dL — AB (ref 70–99)
POTASSIUM: 3.2 mmol/L — AB (ref 3.5–5.1)
SODIUM: 141 mmol/L (ref 135–145)
TOTAL PROTEIN: 7.2 g/dL (ref 6.5–8.1)
Total Bilirubin: 0.6 mg/dL (ref 0.3–1.2)

## 2018-05-30 LAB — I-STAT BETA HCG BLOOD, ED (MC, WL, AP ONLY)

## 2018-05-30 LAB — URINALYSIS, ROUTINE W REFLEX MICROSCOPIC
Bilirubin Urine: NEGATIVE
Glucose, UA: NEGATIVE mg/dL
HGB URINE DIPSTICK: NEGATIVE
Ketones, ur: NEGATIVE mg/dL
Leukocytes, UA: NEGATIVE
NITRITE: NEGATIVE
PROTEIN: NEGATIVE mg/dL
SPECIFIC GRAVITY, URINE: 1.02 (ref 1.005–1.030)
pH: 7 (ref 5.0–8.0)

## 2018-05-30 LAB — LIPASE, BLOOD: Lipase: 34 U/L (ref 11–51)

## 2018-05-30 NOTE — ED Triage Notes (Signed)
Patient to ED c/o LUQ pain with nausea and burning with urination for over a week - reports she was seen at [facility] on the 7th for the same. Patient also endorses "a little diarrhea" that began yesterday. Unknown if she's had fevers, but has felt chills. Patient adds that she has asthma and congestion and doesn't have an inhaler. Resp e/u, skin warm/dry.

## 2018-05-31 ENCOUNTER — Emergency Department (HOSPITAL_COMMUNITY): Payer: Self-pay

## 2018-05-31 MED ORDER — ONDANSETRON HCL 4 MG/2ML IJ SOLN
4.0000 mg | Freq: Once | INTRAMUSCULAR | Status: AC
  Administered 2018-05-31: 4 mg via INTRAVENOUS
  Filled 2018-05-31: qty 2

## 2018-05-31 MED ORDER — DIPHENHYDRAMINE HCL 50 MG/ML IJ SOLN
25.0000 mg | Freq: Once | INTRAMUSCULAR | Status: AC
  Administered 2018-05-31: 25 mg via INTRAVENOUS
  Filled 2018-05-31: qty 1

## 2018-05-31 MED ORDER — IOHEXOL 300 MG/ML  SOLN
100.0000 mL | Freq: Once | INTRAMUSCULAR | Status: AC | PRN
  Administered 2018-05-31: 100 mL via INTRAVENOUS

## 2018-05-31 MED ORDER — HYDROMORPHONE HCL 1 MG/ML IJ SOLN
1.0000 mg | Freq: Once | INTRAMUSCULAR | Status: AC
  Administered 2018-05-31: 1 mg via INTRAVENOUS
  Filled 2018-05-31: qty 1

## 2018-05-31 MED ORDER — FAMOTIDINE IN NACL 20-0.9 MG/50ML-% IV SOLN
20.0000 mg | Freq: Once | INTRAVENOUS | Status: AC
  Administered 2018-05-31: 20 mg via INTRAVENOUS
  Filled 2018-05-31: qty 50

## 2018-05-31 MED ORDER — EPINEPHRINE 0.3 MG/0.3ML IJ SOAJ
INTRAMUSCULAR | Status: AC
  Filled 2018-05-31: qty 0.3

## 2018-05-31 MED ORDER — EPINEPHRINE 0.3 MG/0.3ML IJ SOAJ
0.3000 mg | Freq: Once | INTRAMUSCULAR | Status: AC
  Administered 2018-05-31: 0.3 mg via INTRAMUSCULAR

## 2018-05-31 MED ORDER — METHYLPREDNISOLONE SODIUM SUCC 125 MG IJ SOLR
125.0000 mg | Freq: Once | INTRAMUSCULAR | Status: AC
  Administered 2018-05-31: 125 mg via INTRAVENOUS
  Filled 2018-05-31: qty 2

## 2018-05-31 NOTE — ED Provider Notes (Signed)
[facility] EMERGENCY DEPARTMENT Provider Note   CSN: [id number] Arrival date & time: 05/30/18  1804     History   Chief Complaint Chief Complaint  Patient presents with  . Abdominal Pain    HPI Olean Sangster is a 32 y.o. female.  Patient presents to the emergency department with a chief complaint of abdominal pain.  She reports associated nausea.  She has been seen several times for the same, also has significant history remarkable for allergic type reactions, with some history of psychogenic origin.  Patient currently taking antibiotics for presumed diverticulitis.  Patient states that she has moderate to severe left lower abdominal pain.  She also reports some dysuria.  The history is provided by the patient. A language interpreter was used.    Past Medical History:  Diagnosis Date  . Angio-edema   . Asthma    Inhaler used 01/02/16  . Diverticulitis   . Nausea & vomiting 04/02/2017  . Pancreatitis   . Pre-diabetes   . Pre-diabetes   . Pyelonephritis   . Pyelonephritis   . Urticaria     Patient Active Problem List   Diagnosis Date Noted  . Cervical radiculopathy   . Left arm weakness   . Left-sided weakness 02/19/2018  . Weakness 02/19/2018  . Rash 02/09/2018  . Chest pain 02/08/2018  . Acute pancreatitis 04/02/2017  . Abdominal pain, acute, left upper quadrant 04/02/2017  . Intractable nausea and vomiting 04/02/2017  . Asthma in adult 04/02/2017  . Obesity (BMI 30.0-34.9) 04/02/2017  . Fatty infiltration of liver 04/02/2017  . Gastroesophageal reflux disease without esophagitis   . Pre-diabetes 07/15/2015  . Diarrhea   . Hypokalemia   . Bacterial vaginosis   . Pyelonephritis 07/07/2015  . Abdominal pain, right upper quadrant   . Asthma 10/31/2014  . History of preterm delivery 10/22/2014  . Status post repeat low transverse cesarean section 09/06/2014  . Diverticulitis 09/10/2012    Past Surgical History:  Procedure Laterality  Date  . CESAREAN SECTION    . CESAREAN SECTION N/A 2006  . CESAREAN SECTION N/A 09/03/2014   Procedure: CESAREAN SECTION;  Surgeon: Guss Bunde, MD;  Location: [facility];  Service: Obstetrics;  Laterality: N/A;  . CHOLECYSTECTOMY    . ESOPHAGOGASTRODUODENOSCOPY (EGD) WITH PROPOFOL Left 04/07/2017   Procedure: ESOPHAGOGASTRODUODENOSCOPY (EGD) WITH PROPOFOL;  Surgeon: Ronnette Juniper, MD;  Location: [facility];  Service: Gastroenterology;  Laterality: Left;  . TUBAL LIGATION       OB History    Gravida  5   Para  3   Term  1   Preterm  2   AB  2   Living  3     SAB  2   TAB      Ectopic      Multiple  1   Live Births  3            Home Medications    Prior to Admission medications   Medication Sig Start Date End Date Taking? Authorizing Provider  albuterol (PROVENTIL HFA;VENTOLIN HFA) 108 (90 Base) MCG/ACT inhaler Inhale 2 puffs into the lungs every 6 (six) hours as needed for wheezing or shortness of breath. Patient not taking: Reported on 03/17/2018 03/17/18   Valentina Shaggy, MD  amoxicillin-clavulanate (AUGMENTIN) 875-125 MG tablet Take 1 tablet by mouth every 12 (twelve) hours. 05/25/18   Khatri, Hina, PA-C  cetirizine (ZYRTEC) 10 MG tablet Take 1 tablet (10 mg total) by mouth daily. 03/17/18  Valentina Shaggy, MD  cyclobenzaprine (FLEXERIL) 10 MG tablet Take 1 tablet (10 mg total) by mouth at bedtime. 02/25/18   Roxan Hockey, MD  diphenhydrAMINE (BENADRYL) 25 MG tablet Take 1 tablet (25 mg total) by mouth every 6 (six) hours. 03/24/18   Zigmund Gottron, NP  famotidine (PEPCID) 20 MG tablet Take 1 tablet (20 mg total) by mouth 2 (two) times daily. 05/25/18   Khatri, Hina, PA-C  FLUoxetine (PROZAC) 20 MG capsule Take 1 capsule (20 mg total) by mouth daily. 02/26/18   Emokpae, Courage, MD  fluticasone furoate-vilanterol (BREO ELLIPTA) 100-25 MCG/INH AEPB Inhale 1 puff daily and brush teeth and tongue after use. 03/17/18   Valentina Shaggy, MD  hydrOXYzine  (ATARAX/VISTARIL) 25 MG tablet Take 1 tablet (25 mg total) by mouth every 6 (six) hours. 03/18/18   Tereasa Coop, PA-C  ibuprofen (ADVIL,MOTRIN) 200 MG tablet Take 400 mg by mouth daily as needed (back pain).    [provider]  montelukast (SINGULAIR) 10 MG tablet Take one tablet once at bedtime 02/25/18   Emokpae, Courage, MD  ondansetron (ZOFRAN ODT) 4 MG disintegrating tablet Take 1 tablet (4 mg total) by mouth every 8 (eight) hours as needed for nausea or vomiting. 05/25/18   Delia Heady, PA-C  ranitidine (ZANTAC) 300 MG tablet Take one tablet once daily 02/25/18   Roxan Hockey, MD    Family History Family History  Problem Relation Age of Onset  . Hyperlipidemia Mother   . Diabetes Father   . Diabetes Maternal Uncle   . Diabetes Paternal Grandmother   . Asthma Daughter   . Allergic rhinitis Neg Hx   . Angioedema Neg Hx   . Eczema Neg Hx   . Immunodeficiency Neg Hx   . Urticaria Neg Hx     Social History Social History   Tobacco Use  . Smoking status: Never Smoker  . Smokeless tobacco: Never Used  Substance Use Topics  . Alcohol use: No  . Drug use: No     Allergies   Lupine bean extract; Morphine; and Ivp dye [iodinated diagnostic agents]   Review of Systems Review of Systems  All other systems reviewed and are negative.    Physical Exam Updated Vital Signs BP (!) 104/50   Pulse (!) 104   Temp 98.6 F (37 C)   Resp 19   LMP 05/17/2018   SpO2 99%   Physical Exam  Constitutional: She is oriented to person, place, and time. She appears well-developed and well-nourished.  HENT:  Head: Normocephalic and atraumatic.  Eyes: Pupils are equal, round, and reactive to light. Conjunctivae and EOM are normal.  Neck: Normal range of motion. Neck supple.  Cardiovascular: Normal rate and regular rhythm. Exam reveals no gallop and no friction rub.  No murmur heard. Pulmonary/Chest: Effort normal and breath sounds normal. No respiratory distress. She has  no wheezes. She has no rales. She exhibits no tenderness.  Abdominal: Soft. Bowel sounds are normal. She exhibits no distension and no mass. There is tenderness in the left lower quadrant. There is no rebound and no guarding.  Musculoskeletal: Normal range of motion. She exhibits no edema or tenderness.  Neurological: She is alert and oriented to person, place, and time.  Skin: Skin is warm and dry.  Psychiatric: She has a normal mood and affect. Her behavior is normal. Judgment and thought content normal.  Nursing note and vitals reviewed.    ED Treatments / Results  Labs (all labs ordered  are listed, but only abnormal results are displayed) Labs Reviewed  COMPREHENSIVE METABOLIC PANEL - Abnormal; Notable for the following components:      Result Value   Potassium 3.2 (*)    Glucose, Bld 108 (*)    All other components within normal limits  LIPASE, BLOOD  CBC  URINALYSIS, ROUTINE W REFLEX MICROSCOPIC  I-STAT BETA HCG BLOOD, ED ([facility], [facility], [facility] ONLY)    EKG None  Radiology Ct Abdomen Pelvis W Contrast  Result Date: 05/31/2018 CLINICAL DATA:  LEFT upper quadrant pain with nausea for a week, dysuria. History of diverticulitis, pancreatitis, pyelonephritis, cholecystectomy. EXAM: CT ABDOMEN AND PELVIS WITH CONTRAST TECHNIQUE: Multidetector CT imaging of the abdomen and pelvis was performed using the standard protocol following bolus administration of intravenous contrast. CONTRAST:  128mL OMNIPAQUE IOHEXOL 300 MG/ML  SOLN COMPARISON:  CT abdomen and pelvis March 05, 2018. FINDINGS: LOWER CHEST: Dependent atelectasis. Included heart size is normal. No pericardial effusion. HEPATOBILIARY: Status post cholecystectomy.  Normal liver. PANCREAS: Normal. SPLEEN: Normal. ADRENALS/URINARY TRACT: Kidneys are orthotopic, demonstrating symmetric enhancement. No nephrolithiasis, hydronephrosis or solid renal masses. The unopacified ureters are normal in course and caliber. Urinary bladder is partially  distended and unremarkable. Normal adrenal glands. STOMACH/BOWEL: The stomach, small and large bowel are normal in course and caliber without inflammatory changes, sensitivity decreased without oral contrast. Moderate colonic diverticulosis. Normal appendix. VASCULAR/LYMPHATIC: Aortoiliac vessels are normal in course and caliber. No lymphadenopathy by CT size criteria. REPRODUCTIVE: Normal. Migrated RIGHT tubal ligation clip anterior to bladder. OTHER: No intraperitoneal free fluid or free air. MUSCULOSKELETAL: Nonacute. Small fat containing umbilical hernia. Anterior abdominal wall scarring. IMPRESSION: 1. Colonic diverticulosis without acute diverticulitis nor acute intra-abdominal/pelvic process. 2. Migrated RIGHT tubal ligation clip. Electronically Signed   By: Elon Alas M.D.   On: 05/31/2018 01:35    Procedures Procedures (including critical care time)  Medications Ordered in ED Medications  HYDROmorphone (DILAUDID) injection 1 mg (1 mg Intravenous Given 05/31/18 0055)  ondansetron (ZOFRAN) injection 4 mg (4 mg Intravenous Given 05/31/18 0055)  iohexol (OMNIPAQUE) 300 MG/ML solution 100 mL (100 mLs Intravenous Contrast Given 05/31/18 0108)  diphenhydrAMINE (BENADRYL) injection 25 mg (25 mg Intravenous Given 05/31/18 0139)  famotidine (PEPCID) IVPB 20 mg premix (0 mg Intravenous Stopped 05/31/18 0226)  methylPREDNISolone sodium succinate (SOLU-MEDROL) 125 mg/2 mL injection 125 mg (125 mg Intravenous Given 05/31/18 0139)  EPINEPHrine (EPI-PEN) injection 0.3 mg (0.3 mg Intramuscular Given 05/31/18 0133)  diphenhydrAMINE (BENADRYL) injection 25 mg (25 mg Intravenous Given 05/31/18 0200)     Initial Impression / Assessment and Plan / ED Course  I have reviewed the triage vital signs and the nursing notes.  Pertinent labs & imaging results that were available during my care of the patient were reviewed by me and considered in my medical decision making (see chart for details).     Patient  with left lower quadrant abdominal pain and nausea.  CT is negative.  Upon return from CT, patient complaining of itching.  She has no hives.  Her lungs are clear to auscultation.  No oropharyngeal swelling.  No stridor.  No vomiting.  Has had numerous reported allergic reactions in the past.  She has been seen for this many times and has required Benadryl, Solu-Medrol, Pepcid, and EpiPen's.  However, on physical exam she has no findings of allergic reaction or anaphylaxis.  In review of prior charts, there is some concern for psychogenic origin.  I do not believe patient had an anaphylactic reaction.  Believe her to be stable for discharge and outpatient follow-up.  Patient's husband agrees with discharge plan.  Patient will follow-up with appropriate specialists including allergist and GI.  Final Clinical Impressions(s) / ED Diagnoses   Final diagnoses:  Left lower quadrant pain  Pruritus    ED Discharge Orders    None       Montine Circle, PA-C 05/31/18 0532    Orpah Greek, MD 05/31/18 [phone]

## 2018-05-31 NOTE — ED Notes (Signed)
Patient arrived from CT post IV dye, c/o hives, itching. Provider notified.

## 2018-05-31 NOTE — ED Notes (Signed)
Patient ambulated to bathroom with assist of husband.

## 2018-06-26 ENCOUNTER — Emergency Department (HOSPITAL_COMMUNITY)
Admission: EM | Admit: 2018-06-26 | Discharge: 2018-06-27 | Disposition: A | Discharge status: Home / Self Care | Payer: Self-pay | Attending: Emergency Medicine | Admitting: Emergency Medicine

## 2018-06-26 ENCOUNTER — Other Ambulatory Visit: Payer: Self-pay

## 2018-06-26 ENCOUNTER — Encounter (HOSPITAL_COMMUNITY): Payer: Self-pay

## 2018-06-26 DIAGNOSIS — G8929 Other chronic pain: Secondary | ICD-10-CM | POA: Insufficient documentation

## 2018-06-26 DIAGNOSIS — J45909 Unspecified asthma, uncomplicated: Secondary | ICD-10-CM | POA: Insufficient documentation

## 2018-06-26 DIAGNOSIS — R109 Unspecified abdominal pain: Secondary | ICD-10-CM

## 2018-06-26 DIAGNOSIS — R1033 Periumbilical pain: Secondary | ICD-10-CM | POA: Insufficient documentation

## 2018-06-26 DIAGNOSIS — Z9049 Acquired absence of other specified parts of digestive tract: Secondary | ICD-10-CM | POA: Insufficient documentation

## 2018-06-26 LAB — CBC WITH DIFFERENTIAL/PLATELET
ABS IMMATURE GRANULOCYTES: 0.03 10*3/uL (ref 0.00–0.07)
BASOS ABS: 0 10*3/uL (ref 0.0–0.1)
BASOS PCT: 0 %
Eosinophils Absolute: 0.1 10*3/uL (ref 0.0–0.5)
Eosinophils Relative: 1 %
HCT: 39.5 % (ref 36.0–46.0)
HEMOGLOBIN: 12.8 g/dL (ref 12.0–15.0)
Immature Granulocytes: 0 %
LYMPHS PCT: 36 %
Lymphs Abs: 3.2 10*3/uL (ref 0.7–4.0)
MCH: 29.8 pg (ref 26.0–34.0)
MCHC: 32.4 g/dL (ref 30.0–36.0)
MCV: 91.9 fL (ref 80.0–100.0)
MONO ABS: 0.6 10*3/uL (ref 0.1–1.0)
Monocytes Relative: 7 %
Neutro Abs: 4.8 10*3/uL (ref 1.7–7.7)
Neutrophils Relative %: 56 %
PLATELETS: 301 10*3/uL (ref 150–400)
RBC: 4.3 MIL/uL (ref 3.87–5.11)
RDW: 12.3 % (ref 11.5–15.5)
WBC: 8.8 10*3/uL (ref 4.0–10.5)
nRBC: 0 % (ref 0.0–0.2)

## 2018-06-26 NOTE — ED Triage Notes (Signed)
Pt reports right upper abd pain x1 week and radiates to lower abd and left flank. Pt report N/V/D. Denies trouble peeing. Last period on 06/16/2018.

## 2018-06-26 NOTE — ED Provider Notes (Addendum)
[facility] DEPT Provider Note: Georgena Spurling, MD, FACEP  CSN: [id number] MRN: [id number] ARRIVAL: 06/26/18 at 2137 ROOM: [facility]  Abdominal Pain  Spanish interpreter used HISTORY OF PRESENT ILLNESS  06/26/18 11:42 PM Lorraine [name] is a 32 y.o. female with a history of multiple visits for abdominal pain and multiple CTs, pelvic and abdominal ultrasounds over the past several years.  She is here with a one-week history of abdominal pain.  The abdominal pain began in the left upper quadrant and is now radiating to the right upper quadrant.  She describes it as feeling "like when you drink water and then go running".  She states it is "very very bad".  It is worse with movement and slightly worse with urination.  She has had nausea and vomiting today and diarrhea for 3 days.  She is not aware of having a fever.  She denies dysuria.  Last menstrual period was 06/16/2018.   Past Medical History:  Diagnosis Date  . Angio-edema   . Asthma    Inhaler used 01/02/16  . Diverticulitis   . Nausea & vomiting 04/02/2017  . Pancreatitis   . Pre-diabetes   . Pyelonephritis   . Urticaria     Past Surgical History:  Procedure Laterality Date  . CESAREAN SECTION    . CESAREAN SECTION N/A 2006  . CESAREAN SECTION N/A 09/03/2014   Procedure: CESAREAN SECTION;  Surgeon: Guss Bunde, MD;  Location: [facility];  Service: Obstetrics;  Laterality: N/A;  . CHOLECYSTECTOMY    . ESOPHAGOGASTRODUODENOSCOPY (EGD) WITH PROPOFOL Left 04/07/2017   Procedure: ESOPHAGOGASTRODUODENOSCOPY (EGD) WITH PROPOFOL;  Surgeon: Ronnette Juniper, MD;  Location: [facility];  Service: Gastroenterology;  Laterality: Left;  . TUBAL LIGATION      Family History  Problem Relation Age of Onset  . Hyperlipidemia Mother   . Diabetes Father   . Diabetes Maternal Uncle   . Diabetes Paternal Grandmother   . Asthma Daughter   . Allergic rhinitis Neg Hx   . Angioedema Neg Hx   . Eczema Neg Hx   .  Immunodeficiency Neg Hx   . Urticaria Neg Hx     Social History   Tobacco Use  . Smoking status: Never Smoker  . Smokeless tobacco: Never Used  Substance Use Topics  . Alcohol use: No  . Drug use: No    Prior to Admission medications   Medication Sig Start Date End Date Taking? Authorizing Provider  ibuprofen (ADVIL,MOTRIN) 200 MG tablet Take 400 mg by mouth daily as needed (back pain).   Yes [provider]  albuterol (PROVENTIL HFA;VENTOLIN HFA) 108 (90 Base) MCG/ACT inhaler Inhale 2 puffs into the lungs every 6 (six) hours as needed for wheezing or shortness of breath. Patient not taking: Reported on 03/17/2018 03/17/18   Valentina Shaggy, MD  amoxicillin-clavulanate (AUGMENTIN) 875-125 MG tablet Take 1 tablet by mouth every 12 (twelve) hours. Patient not taking: Reported on 06/27/2018 05/25/18   Delia Heady, PA-C  cetirizine (ZYRTEC) 10 MG tablet Take 1 tablet (10 mg total) by mouth daily. Patient not taking: Reported on 06/27/2018 03/17/18   Valentina Shaggy, MD  cyclobenzaprine (FLEXERIL) 10 MG tablet Take 1 tablet (10 mg total) by mouth at bedtime. Patient not taking: Reported on 06/27/2018 02/25/18   Roxan Hockey, MD  diphenhydrAMINE (BENADRYL) 25 MG tablet Take 1 tablet (25 mg total) by mouth every 6 (six) hours. Patient not taking: Reported on 06/27/2018 03/24/18   Zigmund Gottron,  NP  famotidine (PEPCID) 20 MG tablet Take 1 tablet (20 mg total) by mouth 2 (two) times daily. Patient not taking: Reported on 06/27/2018 05/25/18   Delia Heady, PA-C  FLUoxetine (PROZAC) 20 MG capsule Take 1 capsule (20 mg total) by mouth daily. Patient not taking: Reported on 06/27/2018 02/26/18   Roxan Hockey, MD  fluticasone furoate-vilanterol (BREO ELLIPTA) 100-25 MCG/INH AEPB Inhale 1 puff daily and brush teeth and tongue after use. Patient not taking: Reported on 06/27/2018 03/17/18   Valentina Shaggy, MD  hydrOXYzine (ATARAX/VISTARIL) 25 MG tablet Take 1 tablet (25 mg  total) by mouth every 6 (six) hours. Patient not taking: Reported on 06/27/2018 03/18/18   Tereasa Coop, PA-C  montelukast (SINGULAIR) 10 MG tablet Take one tablet once at bedtime Patient not taking: Reported on 06/27/2018 02/25/18   Roxan Hockey, MD  ondansetron (ZOFRAN ODT) 4 MG disintegrating tablet Take 1 tablet (4 mg total) by mouth every 8 (eight) hours as needed for nausea or vomiting. Patient not taking: Reported on 06/27/2018 05/25/18   Delia Heady, PA-C  ranitidine (ZANTAC) 300 MG tablet Take one tablet once daily Patient not taking: Reported on 06/27/2018 02/25/18   Roxan Hockey, MD    Allergies Lupine bean extract; Morphine; and Ivp dye [iodinated diagnostic agents]   REVIEW OF SYSTEMS  Negative except as noted here or in the History of Present Illness.   PHYSICAL EXAMINATION  Initial Vital Signs Blood pressure (!) 130/98, pulse 98, temperature 98.5 F (36.9 C), temperature source Oral, last menstrual period 06/16/2018, SpO2 98 %.  Examination General: Well-developed, well-nourished female in no acute distress; appearance consistent with age of record HENT: normocephalic; atraumatic Eyes: pupils equal, round and reactive to light; extraocular muscles intact Neck: supple Heart: regular rate and rhythm Lungs: clear to auscultation bilaterally Abdomen: soft; nondistended; mild periumbilical tenderness; no masses or hepatosplenomegaly; bowel sounds present Extremities: No deformity; full range of motion; pulses normal Neurologic: Awake, alert and oriented; motor function intact in all extremities and symmetric; no facial droop Skin: Warm and dry Psychiatric: Normal mood and affect   RESULTS  Summary of this visit's results, reviewed by myself:   EKG Interpretation  Date/Time:    Ventricular Rate:    PR Interval:    QRS Duration:   QT Interval:    QTC Calculation:   R Axis:     Text Interpretation:        Laboratory Studies: Results for orders  placed or performed during the hospital encounter of 06/26/18 (from the past 24 hour(s))  Urinalysis, Routine w reflex microscopic     Status: Abnormal   Collection Time: 06/26/18 10:18 PM  Result Value Ref Range   Color, Urine YELLOW YELLOW   APPearance HAZY (A) CLEAR   Specific Gravity, Urine 1.028 1.005 - 1.030   pH 6.0 5.0 - 8.0   Glucose, UA NEGATIVE NEGATIVE mg/dL   Hgb urine dipstick NEGATIVE NEGATIVE   Bilirubin Urine NEGATIVE NEGATIVE   Ketones, ur NEGATIVE NEGATIVE mg/dL   Protein, ur NEGATIVE NEGATIVE mg/dL   Nitrite NEGATIVE NEGATIVE   Leukocytes, UA NEGATIVE NEGATIVE  Lipase, blood     Status: None   Collection Time: 06/26/18 11:42 PM  Result Value Ref Range   Lipase 33 11 - 51 U/L  Comprehensive metabolic panel     Status: Abnormal   Collection Time: 06/26/18 11:42 PM  Result Value Ref Range   Sodium 144 135 - 145 mmol/L   Potassium 3.5 3.5 - 5.1 mmol/L  Chloride 105 98 - 111 mmol/L   CO2 25 22 - 32 mmol/L   Glucose, Bld 94 70 - 99 mg/dL   BUN 22 (H) 6 - 20 mg/dL   Creatinine, Ser 0.80 0.44 - 1.00 mg/dL   Calcium 9.5 8.9 - 10.3 mg/dL   Total Protein 7.2 6.5 - 8.1 g/dL   Albumin 4.0 3.5 - 5.0 g/dL   AST 26 15 - 41 U/L   ALT 32 0 - 44 U/L   Alkaline Phosphatase 101 38 - 126 U/L   Total Bilirubin 0.3 0.3 - 1.2 mg/dL   GFR calc non Af Amer >60 >60 mL/min   GFR calc Af Amer >60 >60 mL/min   Anion gap 14 5 - 15  CBC with Differential/Platelet     Status: None   Collection Time: 06/26/18 11:45 PM  Result Value Ref Range   WBC 8.8 4.0 - 10.5 K/uL   RBC 4.30 3.87 - 5.11 MIL/uL   Hemoglobin 12.8 12.0 - 15.0 g/dL   HCT 39.5 36.0 - 46.0 %   MCV 91.9 80.0 - 100.0 fL   MCH 29.8 26.0 - 34.0 pg   MCHC 32.4 30.0 - 36.0 g/dL   RDW 12.3 11.5 - 15.5 %   Platelets 301 150 - 400 K/uL   nRBC 0.0 0.0 - 0.2 %   Neutrophils Relative % 56 %   Neutro Abs 4.8 1.7 - 7.7 K/uL   Lymphocytes Relative 36 %   Lymphs Abs 3.2 0.7 - 4.0 K/uL   Monocytes Relative 7 %   Monocytes  Absolute 0.6 0.1 - 1.0 K/uL   Eosinophils Relative 1 %   Eosinophils Absolute 0.1 0.0 - 0.5 K/uL   Basophils Relative 0 %   Basophils Absolute 0.0 0.0 - 0.1 K/uL   Immature Granulocytes 0 %   Abs Immature Granulocytes 0.03 0.00 - 0.07 K/uL  I-Stat beta hCG blood, ED     Status: None   Collection Time: 06/26/18 11:48 PM  Result Value Ref Range   I-stat hCG, quantitative <5.0 <5 mIU/mL   Comment 3           Imaging Studies: No results found.  ED COURSE and MDM  Nursing notes and initial vitals signs, including pulse oximetry, reviewed.  Vitals:   06/26/18 2206 06/27/18 0149 06/27/18 0255  BP: (!) 130/98 (!) 103/48 135/81  Pulse: 98 81 95  Resp:  16 18  Temp: 98.5 F (36.9 C)    TempSrc: Oral    SpO2: 98% 99% 99%   4:41 AM Patient's nausea relieved with IV Zofran and abdominal discomfort improved with oral Carafate.  She is still having mild periumbilical tenderness.  Her laboratory studies are normal and as she has had multiple CTs and ultrasounds of the abdomen and pelvis I do not believe additional imaging studies are indicated at this time.  We will treat her with Zofran and omeprazole.  PROCEDURES    ED DIAGNOSES     ICD-10-CM   1. Chronic abdominal pain R10.9    G89.29        Jenniferlynn Saad, Jenny Reichmann, MD 06/27/18 0442    Shanon Rosser, MD 06/27/18 [phone]

## 2018-06-27 LAB — URINALYSIS, ROUTINE W REFLEX MICROSCOPIC
BILIRUBIN URINE: NEGATIVE
Glucose, UA: NEGATIVE mg/dL
HGB URINE DIPSTICK: NEGATIVE
Ketones, ur: NEGATIVE mg/dL
Leukocytes, UA: NEGATIVE
Nitrite: NEGATIVE
PH: 6 (ref 5.0–8.0)
Protein, ur: NEGATIVE mg/dL
SPECIFIC GRAVITY, URINE: 1.028 (ref 1.005–1.030)

## 2018-06-27 LAB — COMPREHENSIVE METABOLIC PANEL
ALBUMIN: 4 g/dL (ref 3.5–5.0)
ALT: 32 U/L (ref 0–44)
AST: 26 U/L (ref 15–41)
Alkaline Phosphatase: 101 U/L (ref 38–126)
Anion gap: 14 (ref 5–15)
BUN: 22 mg/dL — AB (ref 6–20)
CHLORIDE: 105 mmol/L (ref 98–111)
CO2: 25 mmol/L (ref 22–32)
CREATININE: 0.8 mg/dL (ref 0.44–1.00)
Calcium: 9.5 mg/dL (ref 8.9–10.3)
GFR calc Af Amer: >60 mL/min (ref >60)
GFR calc non Af Amer: >60 mL/min (ref >60)
Glucose, Bld: 94 mg/dL (ref 70–99)
Potassium: 3.5 mmol/L (ref 3.5–5.1)
SODIUM: 144 mmol/L (ref 135–145)
Total Bilirubin: 0.3 mg/dL (ref 0.3–1.2)
Total Protein: 7.2 g/dL (ref 6.5–8.1)

## 2018-06-27 LAB — I-STAT BETA HCG BLOOD, ED (MC, WL, AP ONLY)

## 2018-06-27 LAB — LIPASE, BLOOD: LIPASE: 33 U/L (ref 11–51)

## 2018-06-27 MED ORDER — SUCRALFATE 1 GM/10ML PO SUSP
1.0000 g | Freq: Once | ORAL | Status: AC
  Administered 2018-06-27: 1 g via ORAL
  Filled 2018-06-27: qty 10

## 2018-06-27 MED ORDER — OMEPRAZOLE 20 MG PO CPDR: 20.0000 mg | DELAYED_RELEASE_CAPSULE | Freq: Every day | ORAL | 0 refills | Status: DC

## 2018-06-27 MED ORDER — ONDANSETRON HCL 4 MG/2ML IJ SOLN
4.0000 mg | Freq: Once | INTRAMUSCULAR | Status: AC
  Administered 2018-06-27: 4 mg via INTRAVENOUS
  Filled 2018-06-27: qty 2

## 2018-06-27 MED ORDER — ONDANSETRON 8 MG PO TBDP: 8.0000 mg | ORAL_TABLET | Freq: Three times a day (TID) | ORAL | 0 refills | Status: DC | PRN

## 2018-06-29 ENCOUNTER — Encounter (HOSPITAL_COMMUNITY): Payer: Self-pay | Admitting: *Deleted

## 2018-06-29 ENCOUNTER — Inpatient Hospital Stay (HOSPITAL_COMMUNITY)
Admission: AD | Admit: 2018-06-29 | Discharge: 2018-06-30 | Disposition: A | Discharge status: Home / Self Care | Payer: Self-pay | Source: Ambulatory Visit | Attending: Obstetrics and Gynecology | Admitting: Obstetrics and Gynecology

## 2018-06-29 ENCOUNTER — Inpatient Hospital Stay (HOSPITAL_COMMUNITY): Payer: Self-pay

## 2018-06-29 ENCOUNTER — Other Ambulatory Visit: Payer: Self-pay

## 2018-06-29 DIAGNOSIS — N760 Acute vaginitis: Secondary | ICD-10-CM | POA: Insufficient documentation

## 2018-06-29 DIAGNOSIS — R1032 Left lower quadrant pain: Secondary | ICD-10-CM | POA: Insufficient documentation

## 2018-06-29 DIAGNOSIS — Z3202 Encounter for pregnancy test, result negative: Secondary | ICD-10-CM

## 2018-06-29 DIAGNOSIS — B9689 Other specified bacterial agents as the cause of diseases classified elsewhere: Secondary | ICD-10-CM | POA: Insufficient documentation

## 2018-06-29 LAB — WET PREP, GENITAL
SPERM: NONE SEEN
Trich, Wet Prep: NONE SEEN
Yeast Wet Prep HPF POC: NONE SEEN

## 2018-06-29 LAB — COMPREHENSIVE METABOLIC PANEL
ALT: 28 U/L (ref 0–44)
AST: 23 U/L (ref 15–41)
Albumin: 3.9 g/dL (ref 3.5–5.0)
Alkaline Phosphatase: 107 U/L (ref 38–126)
Anion gap: 9 (ref 5–15)
BUN: 17 mg/dL (ref 6–20)
CHLORIDE: 104 mmol/L (ref 98–111)
CO2: 25 mmol/L (ref 22–32)
Calcium: 9.3 mg/dL (ref 8.9–10.3)
Creatinine, Ser: 0.62 mg/dL (ref 0.44–1.00)
GFR calc Af Amer: >60 mL/min (ref >60)
GFR calc non Af Amer: >60 mL/min (ref >60)
GLUCOSE: 95 mg/dL (ref 70–99)
Potassium: 3.6 mmol/L (ref 3.5–5.1)
Sodium: 138 mmol/L (ref 135–145)
Total Bilirubin: 0.2 mg/dL — ABNORMAL LOW (ref 0.3–1.2)
Total Protein: 6.9 g/dL (ref 6.5–8.1)

## 2018-06-29 LAB — CBC
HCT: 35.9 % — ABNORMAL LOW (ref 36.0–46.0)
Hemoglobin: 11.9 g/dL — ABNORMAL LOW (ref 12.0–15.0)
MCH: 29.9 pg (ref 26.0–34.0)
MCHC: 33.1 g/dL (ref 30.0–36.0)
MCV: 90.2 fL (ref 80.0–100.0)
NRBC: 0 % (ref 0.0–0.2)
PLATELETS: 295 10*3/uL (ref 150–400)
RBC: 3.98 MIL/uL (ref 3.87–5.11)
RDW: 12.4 % (ref 11.5–15.5)
WBC: 13 10*3/uL — ABNORMAL HIGH (ref 4.0–10.5)

## 2018-06-29 LAB — URINALYSIS, ROUTINE W REFLEX MICROSCOPIC
Bilirubin Urine: NEGATIVE
GLUCOSE, UA: NEGATIVE mg/dL
HGB URINE DIPSTICK: NEGATIVE
Ketones, ur: NEGATIVE mg/dL
Leukocytes, UA: NEGATIVE
Nitrite: NEGATIVE
PH: 7 (ref 5.0–8.0)
Protein, ur: NEGATIVE mg/dL
SPECIFIC GRAVITY, URINE: 1.015 (ref 1.005–1.030)

## 2018-06-29 LAB — LIPASE, BLOOD: LIPASE: 28 U/L (ref 11–51)

## 2018-06-29 LAB — POCT PREGNANCY, URINE: PREG TEST UR: NEGATIVE

## 2018-06-29 MED ORDER — ACETAMINOPHEN 325 MG PO TABS
650.0000 mg | ORAL_TABLET | Freq: Once | ORAL | Status: AC
  Administered 2018-06-29: 650 mg via ORAL
  Filled 2018-06-29: qty 2

## 2018-06-29 MED ORDER — METRONIDAZOLE 500 MG PO TABS: 500.0000 mg | ORAL_TABLET | Freq: Two times a day (BID) | ORAL | 0 refills | Status: DC

## 2018-06-29 NOTE — MAU Provider Note (Signed)
History     CSN: [id number]  Arrival date and time: 06/29/18 2036   First Provider Initiated Contact with Patient 06/29/18 2333      Chief Complaint  Patient presents with  . Abdominal Pain   HPI  Lorraine [name] is a 32 y.o. [id number] non-pregnant patient who presents to MAU with chief complaint of LLQ pain. This is a recurrent problem, onset within the past few years. Patient describes discomfort as "pressure". Unable to provide pain rating, does not radiate, no aggravating or alleviating factors. PMH is significant for the following:   Past Medical History:  Diagnosis Date  . Angio-edema   . Asthma    Inhaler used 01/02/16  . Diverticulitis   . Nausea & vomiting 04/02/2017  . Pancreatitis   . Pre-diabetes   . Pyelonephritis   . Urticaria     Pertinent Gynecological History: Menses: flow is moderate Bleeding: N/A Contraception: none DES exposure: denies Blood transfusions: none Sexually transmitted diseases: currently at risk Previous GYN Procedures: N/A  Last mammogram: N/A age 55  Last pap: normal Date: unknown   Past Medical History:  Diagnosis Date  . Angio-edema   . Asthma    Inhaler used 01/02/16  . Diverticulitis   . Nausea & vomiting 04/02/2017  . Pancreatitis   . Pre-diabetes   . Pyelonephritis   . Urticaria     Past Surgical History:  Procedure Laterality Date  . CESAREAN SECTION    . CESAREAN SECTION N/A 2006  . CESAREAN SECTION N/A 09/03/2014   Procedure: CESAREAN SECTION;  Surgeon: Guss Bunde, MD;  Location: [facility];  Service: Obstetrics;  Laterality: N/A;  . CHOLECYSTECTOMY    . ESOPHAGOGASTRODUODENOSCOPY (EGD) WITH PROPOFOL Left 04/07/2017   Procedure: ESOPHAGOGASTRODUODENOSCOPY (EGD) WITH PROPOFOL;  Surgeon: Ronnette Juniper, MD;  Location: [facility];  Service: Gastroenterology;  Laterality: Left;  . TUBAL LIGATION      Family History  Problem Relation Age of Onset  . Hyperlipidemia Mother   . Diabetes Father   . Diabetes  Maternal Uncle   . Diabetes Paternal Grandmother   . Asthma Daughter   . Allergic rhinitis Neg Hx   . Angioedema Neg Hx   . Eczema Neg Hx   . Immunodeficiency Neg Hx   . Urticaria Neg Hx     Social History   Tobacco Use  . Smoking status: Never Smoker  . Smokeless tobacco: Never Used  Substance Use Topics  . Alcohol use: No  . Drug use: No    Allergies:  Allergies  Allergen Reactions  . Lupine Bean Extract Rash  . Morphine Rash  . Ivp Dye [Iodinated Diagnostic Agents] Hives and Itching    Medications Prior to Admission  Medication Sig Dispense Refill Last Dose  . omeprazole (PRILOSEC) 20 MG capsule Take 1 capsule (20 mg total) by mouth daily. 30 capsule 0   . ondansetron (ZOFRAN ODT) 8 MG disintegrating tablet Take 1 tablet (8 mg total) by mouth every 8 (eight) hours as needed for nausea or vomiting. 10 tablet 0     Review of Systems  Constitutional: Negative for chills, fatigue and fever.  Gastrointestinal: Positive for abdominal pain.  Genitourinary: Negative for difficulty urinating, dyspareunia, dysuria, menstrual problem, vaginal bleeding, vaginal discharge and vaginal pain.  Musculoskeletal: Negative for back pain.  All other systems reviewed and are negative.  Physical Exam   Blood pressure 132/88, pulse (!) 106, temperature 98.5 F (36.9 C), temperature source Oral, resp. rate 17, height 5' (1.524 m), weight  89.4 kg, last menstrual period 06/16/2018.  Physical Exam  Nursing note and vitals reviewed. Constitutional: She is oriented to person, place, and time. She appears well-developed and well-nourished.  Cardiovascular: Normal rate.  Respiratory: Effort normal.  GI: Soft. Bowel sounds are normal. She exhibits no distension. There is no tenderness. There is no rebound and no guarding.  Neurological: She is alert and oriented to person, place, and time. She has normal reflexes.  Skin: Skin is warm and dry.  Psychiatric: She has a normal mood and affect.  Her behavior is normal. Judgment and thought content normal.   Negative CVA tenderness  MAU Course  Procedures  MDM  Patient is s/p workup for abdominal [facility] at [facility] ED 06/26/2018 No new symptoms or changes to chief complaint from previous ED visits  Patient Vitals for the past 24 hrs:  BP Temp Temp src Pulse Resp Height Weight  06/29/18 2350 124/74 - - 89 16 - -  06/29/18 2054 132/88 98.5 F (36.9 C) Oral (!) 106 17 5' (1.524 m) 89.4 kg    Results for orders placed or performed during the hospital encounter of 06/29/18 (from the past 24 hour(s))  Pregnancy, urine POC     Status: None   Collection Time: 06/29/18  9:01 PM  Result Value Ref Range   Preg Test, Ur NEGATIVE NEGATIVE  Urinalysis, Routine w reflex microscopic     Status: None   Collection Time: 06/29/18  9:03 PM  Result Value Ref Range   Color, Urine YELLOW YELLOW   APPearance CLEAR CLEAR   Specific Gravity, Urine 1.015 1.005 - 1.030   pH 7.0 5.0 - 8.0   Glucose, UA NEGATIVE NEGATIVE mg/dL   Hgb urine dipstick NEGATIVE NEGATIVE   Bilirubin Urine NEGATIVE NEGATIVE   Ketones, ur NEGATIVE NEGATIVE mg/dL   Protein, ur NEGATIVE NEGATIVE mg/dL   Nitrite NEGATIVE NEGATIVE   Leukocytes, UA NEGATIVE NEGATIVE  Wet prep, genital     Status: Abnormal   Collection Time: 06/29/18  9:52 PM  Result Value Ref Range   Yeast Wet Prep HPF POC NONE SEEN NONE SEEN   Trich, Wet Prep NONE SEEN NONE SEEN   Clue Cells Wet Prep HPF POC PRESENT (A) NONE SEEN   WBC, Wet Prep HPF POC FEW (A) NONE SEEN   Sperm NONE SEEN   CBC     Status: Abnormal   Collection Time: 06/29/18 10:08 PM  Result Value Ref Range   WBC 13.0 (H) 4.0 - 10.5 K/uL   RBC 3.98 3.87 - 5.11 MIL/uL   Hemoglobin 11.9 (L) 12.0 - 15.0 g/dL   HCT 35.9 (L) 36.0 - 46.0 %   MCV 90.2 80.0 - 100.0 fL   MCH 29.9 26.0 - 34.0 pg   MCHC 33.1 30.0 - 36.0 g/dL   RDW 12.4 11.5 - 15.5 %   Platelets 295 150 - 400 K/uL   nRBC 0.0 0.0 - 0.2 %  Comprehensive metabolic panel      Status: Abnormal   Collection Time: 06/29/18 10:08 PM  Result Value Ref Range   Sodium 138 135 - 145 mmol/L   Potassium 3.6 3.5 - 5.1 mmol/L   Chloride 104 98 - 111 mmol/L   CO2 25 22 - 32 mmol/L   Glucose, Bld 95 70 - 99 mg/dL   BUN 17 6 - 20 mg/dL   Creatinine, Ser 0.62 0.44 - 1.00 mg/dL   Calcium 9.3 8.9 - 10.3 mg/dL   Total Protein 6.9 6.5 - 8.1  g/dL   Albumin 3.9 3.5 - 5.0 g/dL   AST 23 15 - 41 U/L   ALT 28 0 - 44 U/L   Alkaline Phosphatase 107 38 - 126 U/L   Total Bilirubin 0.2 (L) 0.3 - 1.2 mg/dL   GFR calc non Af Amer >60 >60 mL/min   GFR calc Af Amer >60 >60 mL/min   Anion gap 9 5 - 15  Lipase, blood     Status: None   Collection Time: 06/29/18 10:08 PM  Result Value Ref Range   Lipase 28 11 - 51 U/L    US Pelvis (transabdominal Only)  Result Date: 06/29/2018 CLINICAL DATA:  Severe left lower quadrant pain. Negative pregnancy test. EXAM: TRANSABDOMINAL ULTRASOUND OF PELVIS TECHNIQUE: Transabdominal ultrasound examination of the pelvis was performed including evaluation of the uterus, ovaries, adnexal regions, and pelvic cul-de-sac. COMPARISON:  10/13/2017 FINDINGS: Uterus Measurements: 10.4 x 3.6 x 6.4 cm. No fibroids or other mass visualized. Endometrium Thickness: 10 mm transabdominally. No focal abnormality visualized transabdominally. Right ovary Measurements: 3.9 x 2.8 x 3.2 cm. Normal appearance/no adnexal mass. Left ovary Measurements: 3.8 x 2.0 x 1.8 cm. Normal appearance/no adnexal mass. Other findings:  No abnormal free fluid. IMPRESSION: Negative. No pelvic mass or other significant abnormality identified. Electronically Signed   By: Earle Gell M.D.   On: 06/29/2018 23:16    Meds ordered this encounter  Medications  . acetaminophen (TYLENOL) tablet 650 mg  . metroNIDAZOLE (FLAGYL) 500 MG tablet    Sig: Take 1 tablet (500 mg total) by mouth 2 (two) times daily.    Dispense:  14 tablet    Refill:  0    Order Specific Question:   Supervising Provider    Answer:    [name] [[id number]]    Assessment and Plan  --32 y.o. [phone] non-pregnant patient --Bacterial Vaginosis, rx to pharmacy as described above --Chronic abdominal pain, no emergent findings on exam, lab results --Discharge home in stable condition  F/U: Pt to establish care with PCP  iPad interpreter used for all patient interaction  Darlina Rumpf, CNM 06/29/2018, 11:41 PM

## 2018-06-29 NOTE — MAU Note (Signed)
Pt c/o lower left sided abd pain that is pressure pain and hurts when she voids. This just started at 1800 tonight. Sexually acitive but not using birth control. Denies any vag discharge or bleeding.

## 2018-06-29 NOTE — Discharge Instructions (Signed)
Dolor abdominal en adultos  Abdominal Pain, Adult  El dolor abdominal puede tener muchas causas. A menudo, no es grave y mejora sin tratamiento o con tratamiento en la casa. Sin embargo, a veces el dolor abdominal es intenso. El médico revisará sus antecedentes médicos y le hará un examen físico para tratar de determinar la causa del dolor abdominal.  Siga estas instrucciones en su casa:  · Tome los medicamentos de venta libre y los recetados solamente como se lo haya indicado el médico. No tome un laxante a menos que se lo haya indicado el médico.  · Beba suficiente líquido para mantener la orina clara o de color amarillo pálido.  · Controle su afección para ver si hay cambios.  · Concurra a todas las visitas de control como se lo haya indicado el médico. Esto es importante.  Comuníquese con un médico si:  · El dolor abdominal cambia o empeora.  · No tiene apetito o baja de peso sin proponérselo.  · Está estreñido o tiene diarrea durante más de 2 o 3 días.  · Tiene dolor cuando orina o defeca.  · El dolor abdominal lo despierta de noche.  · El dolor empeora con las comidas, después de comer o con determinados alimentos.  · Tiene vómitos y no puede retener nada.  · Tiene fiebre.  Solicite ayuda de inmediato si:  · El dolor no desaparece tan pronto como el médico le dijo que era esperable.  · No puede detener los vómitos.  · El dolor se siente solo en zonas del abdomen, como el lado derecho o la parte inferior izquierda del abdomen.  · Las heces son sanguinolentas o de color negro, o de aspecto alquitranado.  · Tiene dolor intenso, cólicos, o meteorismo en el abdomen.  · Tiene signos de deshidratación, por ejemplo:  ? Orina oscura, muy escasa o falta de orina.  ? Labios agrietados.  ? Boca seca.  ? Ojos hundidos.  ? Somnolencia.  ? Debilidad.  Esta información no tiene como fin reemplazar el consejo del médico. Asegúrese de hacerle al médico cualquier pregunta que tenga.  Document Released: 09/03/2005 Document  Revised: 08/23/2016 Document Reviewed: 02/15/2016  Elsevier Interactive Patient Education © 2018 [employer]

## 2018-07-01 ENCOUNTER — Encounter (HOSPITAL_COMMUNITY): Payer: Self-pay | Admitting: *Deleted

## 2018-07-01 ENCOUNTER — Other Ambulatory Visit: Payer: Self-pay

## 2018-07-01 ENCOUNTER — Emergency Department (HOSPITAL_COMMUNITY)
Admission: EM | Admit: 2018-07-01 | Discharge: 2018-07-02 | Disposition: A | Discharge status: Home / Self Care | Payer: Self-pay | Attending: Emergency Medicine | Admitting: Emergency Medicine

## 2018-07-01 DIAGNOSIS — J45909 Unspecified asthma, uncomplicated: Secondary | ICD-10-CM | POA: Insufficient documentation

## 2018-07-01 DIAGNOSIS — R1011 Right upper quadrant pain: Secondary | ICD-10-CM | POA: Insufficient documentation

## 2018-07-01 LAB — COMPREHENSIVE METABOLIC PANEL
ALK PHOS: 96 U/L (ref 38–126)
ALT: 29 U/L (ref 0–44)
ANION GAP: 8 (ref 5–15)
AST: 35 U/L (ref 15–41)
Albumin: 4 g/dL (ref 3.5–5.0)
BUN: 12 mg/dL (ref 6–20)
CALCIUM: 9.2 mg/dL (ref 8.9–10.3)
CO2: 26 mmol/L (ref 22–32)
Chloride: 107 mmol/L (ref 98–111)
Creatinine, Ser: 0.72 mg/dL (ref 0.44–1.00)
GFR calc Af Amer: >60 mL/min (ref >60)
GFR calc non Af Amer: >60 mL/min (ref >60)
Glucose, Bld: 101 mg/dL — ABNORMAL HIGH (ref 70–99)
Potassium: 3.9 mmol/L (ref 3.5–5.1)
Sodium: 141 mmol/L (ref 135–145)
Total Bilirubin: 0.3 mg/dL (ref 0.3–1.2)
Total Protein: 7.2 g/dL (ref 6.5–8.1)

## 2018-07-01 LAB — CBC
HCT: 38.8 % (ref 36.0–46.0)
Hemoglobin: 12.3 g/dL (ref 12.0–15.0)
MCH: 29.6 pg (ref 26.0–34.0)
MCHC: 31.7 g/dL (ref 30.0–36.0)
MCV: 93.3 fL (ref 80.0–100.0)
NRBC: 0 % (ref 0.0–0.2)
PLATELETS: 306 10*3/uL (ref 150–400)
RBC: 4.16 MIL/uL (ref 3.87–5.11)
RDW: 12.3 % (ref 11.5–15.5)
WBC: 11.6 10*3/uL — AB (ref 4.0–10.5)

## 2018-07-01 LAB — I-STAT BETA HCG BLOOD, ED (MC, WL, AP ONLY): I-stat hCG, quantitative: <5 m[IU]/mL (ref <5)

## 2018-07-01 LAB — GC/CHLAMYDIA PROBE AMP (~~LOC~~) NOT AT ARMC
CHLAMYDIA, DNA PROBE: NEGATIVE
Neisseria Gonorrhea: NEGATIVE

## 2018-07-01 LAB — LIPASE, BLOOD: Lipase: 31 U/L (ref 11–51)

## 2018-07-01 MED ORDER — METOCLOPRAMIDE HCL 5 MG/ML IJ SOLN
5.0000 mg | Freq: Once | INTRAMUSCULAR | Status: AC
  Administered 2018-07-02: 5 mg via INTRAVENOUS
  Filled 2018-07-01: qty 2

## 2018-07-01 MED ORDER — SODIUM CHLORIDE 0.9 % IV SOLN
Freq: Once | INTRAVENOUS | Status: AC
  Administered 2018-07-02: via INTRAVENOUS

## 2018-07-01 NOTE — ED Triage Notes (Signed)
Pt stated "I started vomiting about 2 hours ago."  Pt also c/o right flank pain radiating into abd.

## 2018-07-01 NOTE — ED Notes (Signed)
Pt aware that urine sample is needed.  

## 2018-07-01 NOTE — ED Provider Notes (Addendum)
[facility] DEPT Provider Note   CSN: [id number] Arrival date & time: 07/01/18  2128     History   Chief Complaint Chief Complaint  Patient presents with  . Abdominal Pain  . Emesis    HPI Lorraine [name] is a 32 y.o. female medical history significant for chronic abdominal pain, multiple ED visits requiring several abdominal imaging (CT abdomen pelvis, abdominal ultrasound), diverticulosis, s/p cholecystectomy presenting today with 2 to 3-hour history of right upper quadrant pain.  She rates the pain at 8/10, nonradiating and typically worsens when she walks.  She describes the pain as stabbing in nature and is not associated with meals.  Since onset, she has had several episodes of nonbloody nonbilious emesis.  She reports of subjective chills but denies fevers.  She also reports 4 episodes of loose stools since onset of right upper quadrant pain but denies hematochezia, melena.  She denies any previous episodes of similar symptoms but however states that about a month ago she had a left upper quadrant pain.  Per chart review, she presented to the ED on 06/26/2018 with left upper quadrant pain that radiated to the right upper quadrant with associated nausea, vomiting and diarrhea.  She reported relief with IV Zofran and Carafate and was discharged on omeprazole.  Prior to this, she had gone to the ED on 05/21/2018 with abdominal pain and nausea.  CT abdomen and pelvis was negative for acute intra-abdominal process.  She was advised to follow-up with GI.  Past Medical History:  Diagnosis Date  . Angio-edema   . Asthma    Inhaler used 01/02/16  . Diverticulitis   . Nausea & vomiting 04/02/2017  . Pancreatitis   . Pre-diabetes   . Pyelonephritis   . Urticaria     Patient Active Problem List   Diagnosis Date Noted  . Cervical radiculopathy   . Left arm weakness   . Left-sided weakness 02/19/2018  . Weakness 02/19/2018  . Rash 02/09/2018  . Chest  pain 02/08/2018  . Acute pancreatitis 04/02/2017  . Abdominal pain, acute, left upper quadrant 04/02/2017  . Intractable nausea and vomiting 04/02/2017  . Asthma in adult 04/02/2017  . Obesity (BMI 30.0-34.9) 04/02/2017  . Fatty infiltration of liver 04/02/2017  . Gastroesophageal reflux disease without esophagitis   . Pre-diabetes 07/15/2015  . Diarrhea   . Hypokalemia   . Bacterial vaginosis   . Pyelonephritis 07/07/2015  . Abdominal pain, right upper quadrant   . Asthma 10/31/2014  . History of preterm delivery 10/22/2014  . Status post repeat low transverse cesarean section 09/06/2014  . Diverticulitis 09/10/2012    Past Surgical History:  Procedure Laterality Date  . CESAREAN SECTION    . CESAREAN SECTION N/A 2006  . CESAREAN SECTION N/A 09/03/2014   Procedure: CESAREAN SECTION;  Surgeon: Guss Bunde, MD;  Location: [facility];  Service: Obstetrics;  Laterality: N/A;  . CHOLECYSTECTOMY    . ESOPHAGOGASTRODUODENOSCOPY (EGD) WITH PROPOFOL Left 04/07/2017   Procedure: ESOPHAGOGASTRODUODENOSCOPY (EGD) WITH PROPOFOL;  Surgeon: Ronnette Juniper, MD;  Location: [facility];  Service: Gastroenterology;  Laterality: Left;  . TUBAL LIGATION       OB History    Gravida  5   Para  3   Term  1   Preterm  2   AB  2   Living  3     SAB  2   TAB      Ectopic      Multiple  1  Live Births  3            Home Medications    Prior to Admission medications   Medication Sig Start Date End Date Taking? Authorizing Provider  ibuprofen (ADVIL,MOTRIN) 200 MG tablet Take 400 mg by mouth daily as needed for moderate pain.   Yes [provider]  metroNIDAZOLE (FLAGYL) 500 MG tablet Take 1 tablet (500 mg total) by mouth 2 (two) times daily. 06/29/18  Yes Mallie Snooks C, CNM  omeprazole (PRILOSEC) 20 MG capsule Take 1 capsule (20 mg total) by mouth daily. 06/27/18  Yes Molpus, John, MD  ondansetron (ZOFRAN ODT) 8 MG disintegrating tablet Take 1 tablet (8 mg  total) by mouth every 8 (eight) hours as needed for nausea or vomiting. 06/27/18  Yes Molpus, John, MD    Family History Family History  Problem Relation Age of Onset  . Hyperlipidemia Mother   . Diabetes Father   . Diabetes Maternal Uncle   . Diabetes Paternal Grandmother   . Asthma Daughter   . Allergic rhinitis Neg Hx   . Angioedema Neg Hx   . Eczema Neg Hx   . Immunodeficiency Neg Hx   . Urticaria Neg Hx     Social History Social History   Tobacco Use  . Smoking status: Never Smoker  . Smokeless tobacco: Never Used  Substance Use Topics  . Alcohol use: No  . Drug use: No     Allergies   Lupine bean extract; Morphine; and Ivp dye [iodinated diagnostic agents]   Review of Systems Review of Systems  Constitutional: Positive for chills. Negative for fever.  Respiratory: Negative.   Cardiovascular: Negative.   Gastrointestinal: Positive for abdominal pain (right upper quadrant), diarrhea, nausea and vomiting.  Endocrine: Negative.   Genitourinary: Negative.   Musculoskeletal: Negative.   Skin: Negative.   Neurological: Negative.   Psychiatric/Behavioral: Negative.      Physical Exam Updated Vital Signs BP 140/90 (BP Location: Left Arm)   Pulse (!) 128   Temp 99.3 F (37.4 C) (Oral)   Resp 17   LMP 06/16/2018 (Approximate)   SpO2 99%   Physical Exam  Constitutional: She appears well-developed and well-nourished.  Non-toxic appearance. She does not appear ill. She appears distressed (due to ongoing pain).  HENT:  Head: Normocephalic and atraumatic.  Cardiovascular: Normal rate and regular rhythm. Exam reveals no gallop and no friction rub.  No murmur heard. Pulmonary/Chest: Effort normal and breath sounds normal.  Abdominal: Normal appearance and bowel sounds are normal. There is tenderness in the right upper quadrant. There is no rigidity, no guarding, no tenderness at McBurney's point and negative Murphy's sign.  Neurological: She is alert.  Skin:  Skin is warm.  Psychiatric: She has a normal mood and affect. Her behavior is normal.     ED Treatments / Results  Labs (all labs ordered are listed, but only abnormal results are displayed) Labs Reviewed  COMPREHENSIVE METABOLIC PANEL - Abnormal; Notable for the following components:      Result Value   Glucose, Bld 101 (*)    All other components within normal limits  CBC - Abnormal; Notable for the following components:   WBC 11.6 (*)    All other components within normal limits  LIPASE, BLOOD  URINALYSIS, ROUTINE W REFLEX MICROSCOPIC  I-STAT BETA HCG BLOOD, ED ([facility], [facility], [facility] ONLY)    EKG None  Radiology No results found.  Procedures Procedures (including critical care time)  Medications Ordered in ED Medications  metoCLOPramide (REGLAN) injection 5 mg (has no administration in time range)  0.9 %  sodium chloride infusion (has no administration in time range)     Initial Impression / Assessment and Plan / ED Course  I have reviewed the triage vital signs and the nursing notes.  Pertinent labs & imaging results that were available during my care of the patient were reviewed by me and considered in my medical decision making (see chart for details).    This is a 32 year old Spanish-speaking woman with chronic abdominal pain, multiple ED visits requiring several abdominal imaging (CT abdomen pelvis, abdominal ultrasound), diverticulosis, s/p cholecystectomy presenting today with 2 to 3-hour history of right upper quadrant pain, 8/10, nonradiating, not related to diet, typically worsens when she walks.  Also with several episodes of nonbilious nonbloody emesis and for episodes of loose stools.  Reports of subjective chills but denies fever.  On arrival her vitals were significant for tachycardia to 128, BP of 140/90 but she remained afebrile.   On reevaluation, she appeared very comfortable in bed playing with her child and her phone.  CBC shows mild leukocytosis which is  actually down from her labs 3 days ago.  CMP did not reveal abnormal LFTs given my concern for biliary abnormality, fatty liver or postcholecystectomy pain syndrome, pregnancy test is negative, she has normal lipase.  Patient is safe to discharge and follow-up with GI.  During her last visit to the ED on 06/26/2018 her with of left upper quadrant pain, right upper quadrant pain, nausea, vomiting and diarrhea had improved with Zofran and Carafate.  Her previous abdominal image findings including multiple CT abdomen and pelvis, transabdominal pelvic ultrasound, abdominal x-ray have all been unremarkable.  Her current symptoms of right upper quadrant pain, right upper quadrant tenderness makes acute diverticulitis less likely.    Final Clinical Impressions(s) / ED Diagnoses   Final diagnoses:  Right upper quadrant abdominal pain    ED Discharge Orders    None       Jean Rosenthal, MD 07/02/18 0002    Jean Rosenthal, MD 07/02/18 3570    Blanchie Dessert, MD 07/02/18 2348

## 2018-07-02 NOTE — Discharge Instructions (Signed)
[name],   All your labs were normal.  And I would like for you to follow-up with the GI doctor.  Todos sus laboratorios fueron normales. Y me gustara que hicieras un seguimiento con el mdico Gi.  ~Take Care Dr. Eileen Stanford

## 2018-07-15 ENCOUNTER — Other Ambulatory Visit: Payer: Self-pay

## 2018-07-15 ENCOUNTER — Emergency Department (HOSPITAL_COMMUNITY)
Admission: EM | Admit: 2018-07-15 | Discharge: 2018-07-16 | Disposition: A | Discharge status: Home / Self Care | Payer: Self-pay | Attending: Emergency Medicine | Admitting: Emergency Medicine

## 2018-07-15 ENCOUNTER — Encounter (HOSPITAL_COMMUNITY): Payer: Self-pay | Admitting: Emergency Medicine

## 2018-07-15 ENCOUNTER — Emergency Department (HOSPITAL_COMMUNITY): Payer: Self-pay

## 2018-07-15 ENCOUNTER — Ambulatory Visit (HOSPITAL_COMMUNITY)
Admission: EM | Admit: 2018-07-15 | Discharge: 2018-07-15 | Disposition: A | Discharge status: Home / Self Care | Payer: Self-pay | Attending: Family Medicine | Admitting: Family Medicine

## 2018-07-15 DIAGNOSIS — L299 Pruritus, unspecified: Secondary | ICD-10-CM | POA: Insufficient documentation

## 2018-07-15 DIAGNOSIS — J4521 Mild intermittent asthma with (acute) exacerbation: Secondary | ICD-10-CM

## 2018-07-15 DIAGNOSIS — R079 Chest pain, unspecified: Secondary | ICD-10-CM

## 2018-07-15 DIAGNOSIS — Z79899 Other long term (current) drug therapy: Secondary | ICD-10-CM | POA: Insufficient documentation

## 2018-07-15 DIAGNOSIS — J40 Bronchitis, not specified as acute or chronic: Secondary | ICD-10-CM

## 2018-07-15 LAB — CBC WITH DIFFERENTIAL/PLATELET
Abs Immature Granulocytes: 0.04 10*3/uL (ref 0.00–0.07)
BASOS ABS: 0.1 10*3/uL (ref 0.0–0.1)
Basophils Relative: 1 %
Eosinophils Absolute: 0.2 10*3/uL (ref 0.0–0.5)
Eosinophils Relative: 2 %
HCT: 37.3 % (ref 36.0–46.0)
HEMOGLOBIN: 12 g/dL (ref 12.0–15.0)
IMMATURE GRANULOCYTES: 0 %
LYMPHS ABS: 3.2 10*3/uL (ref 0.7–4.0)
LYMPHS PCT: 36 %
MCH: 29.7 pg (ref 26.0–34.0)
MCHC: 32.2 g/dL (ref 30.0–36.0)
MCV: 92.3 fL (ref 80.0–100.0)
Monocytes Absolute: 0.5 10*3/uL (ref 0.1–1.0)
Monocytes Relative: 5 %
NEUTROS PCT: 56 %
NRBC: 0 % (ref 0.0–0.2)
Neutro Abs: 5.1 10*3/uL (ref 1.7–7.7)
Platelets: 304 10*3/uL (ref 150–400)
RBC: 4.04 MIL/uL (ref 3.87–5.11)
RDW: 12.2 % (ref 11.5–15.5)
WBC: 9.1 10*3/uL (ref 4.0–10.5)

## 2018-07-15 LAB — I-STAT TROPONIN, ED: TROPONIN I, POC: 0 ng/mL (ref 0.00–0.08)

## 2018-07-15 LAB — I-STAT BETA HCG BLOOD, ED (MC, WL, AP ONLY): I-stat hCG, quantitative: <5 m[IU]/mL (ref <5)

## 2018-07-15 MED ORDER — ALBUTEROL SULFATE HFA 108 (90 BASE) MCG/ACT IN AERS: 1.0000 | INHALATION_SPRAY | Freq: Four times a day (QID) | RESPIRATORY_TRACT | 0 refills | Status: DC | PRN

## 2018-07-15 MED ORDER — PREDNISONE 10 MG (21) PO TBPK: ORAL_TABLET | ORAL | 0 refills | Status: DC

## 2018-07-15 MED ORDER — BENZONATATE 100 MG PO CAPS: 100.0000 mg | ORAL_CAPSULE | Freq: Three times a day (TID) | ORAL | 0 refills | Status: DC

## 2018-07-15 MED ORDER — ALBUTEROL SULFATE (2.5 MG/3ML) 0.083% IN NEBU
5.0000 mg | INHALATION_SOLUTION | Freq: Once | RESPIRATORY_TRACT | Status: AC
  Administered 2018-07-15: 5 mg via RESPIRATORY_TRACT
  Filled 2018-07-15: qty 6

## 2018-07-15 MED ORDER — IPRATROPIUM-ALBUTEROL 0.5-2.5 (3) MG/3ML IN SOLN
3.0000 mL | Freq: Once | RESPIRATORY_TRACT | Status: AC
  Administered 2018-07-15: 3 mL via RESPIRATORY_TRACT
  Filled 2018-07-15: qty 3

## 2018-07-15 NOTE — ED Provider Notes (Signed)
[facility]    CSN: [id number] Arrival date & time: 07/15/18  1107     History   Chief Complaint Chief Complaint  Patient presents with  . Asthma    HPI Lorraine [name] is a 32 y.o. female.   Patient is a 32 year old female with past medical history of asthma that presents for cough, wheezing.  This has been present for 3 days.  Reports her symptoms are worsening.  She has been using her albuterol inhaler every 4-6 hours with minimal relief.  She denies any fever, chills, body aches, fatigue, night sweats.  She denies any history of allergies. Reports some sore throat and chest pain from coughing.  She was seen in the emergency room on 07/11/2018 and had a full work-up.  She was then sent home with albuterol inhaler for asthma exacerbation.   ROS per HPI       Past Medical History:  Diagnosis Date  . Angio-edema   . Asthma    Inhaler used 01/02/16  . Diverticulitis   . Nausea & vomiting 04/02/2017  . Pancreatitis   . Pre-diabetes   . Pyelonephritis   . Urticaria     Patient Active Problem List   Diagnosis Date Noted  . Cervical radiculopathy   . Left arm weakness   . Left-sided weakness 02/19/2018  . Weakness 02/19/2018  . Rash 02/09/2018  . Chest pain 02/08/2018  . Acute pancreatitis 04/02/2017  . Abdominal pain, acute, left upper quadrant 04/02/2017  . Intractable nausea and vomiting 04/02/2017  . Asthma in adult 04/02/2017  . Obesity (BMI 30.0-34.9) 04/02/2017  . Fatty infiltration of liver 04/02/2017  . Gastroesophageal reflux disease without esophagitis   . Pre-diabetes 07/15/2015  . Diarrhea   . Hypokalemia   . Bacterial vaginosis   . Pyelonephritis 07/07/2015  . Abdominal pain, right upper quadrant   . Asthma 10/31/2014  . History of preterm delivery 10/22/2014  . Status post repeat low transverse cesarean section 09/06/2014  . Diverticulitis 09/10/2012    Past Surgical History:  Procedure Laterality Date  . CESAREAN SECTION      . CESAREAN SECTION N/A 2006  . CESAREAN SECTION N/A 09/03/2014   Procedure: CESAREAN SECTION;  Surgeon: Guss Bunde, MD;  Location: [facility];  Service: Obstetrics;  Laterality: N/A;  . CHOLECYSTECTOMY    . ESOPHAGOGASTRODUODENOSCOPY (EGD) WITH PROPOFOL Left 04/07/2017   Procedure: ESOPHAGOGASTRODUODENOSCOPY (EGD) WITH PROPOFOL;  Surgeon: Ronnette Juniper, MD;  Location: [facility];  Service: Gastroenterology;  Laterality: Left;  . TUBAL LIGATION      OB History    Gravida  5   Para  3   Term  1   Preterm  2   AB  2   Living  3     SAB  2   TAB      Ectopic      Multiple  1   Live Births  3            Home Medications    Prior to Admission medications   Medication Sig Start Date End Date Taking? Authorizing Provider  albuterol (PROVENTIL HFA;VENTOLIN HFA) 108 (90 Base) MCG/ACT inhaler Inhale 1-2 puffs into the lungs every 6 (six) hours as needed for wheezing or shortness of breath. 07/15/18   Ailyne Pawley, Tressia Miners A, NP  benzonatate (TESSALON) 100 MG capsule Take 1 capsule (100 mg total) by mouth every 8 (eight) hours. 07/15/18   Loura Halt A, NP  ibuprofen (ADVIL,MOTRIN) 200 MG tablet Take 400  mg by mouth daily as needed for moderate pain.    [provider]  metroNIDAZOLE (FLAGYL) 500 MG tablet Take 1 tablet (500 mg total) by mouth 2 (two) times daily. Patient not taking: Reported on 07/15/2018 06/29/18   Darlina Rumpf, CNM  omeprazole (PRILOSEC) 20 MG capsule Take 1 capsule (20 mg total) by mouth daily. 06/27/18   Molpus, John, MD  ondansetron (ZOFRAN ODT) 8 MG disintegrating tablet Take 1 tablet (8 mg total) by mouth every 8 (eight) hours as needed for nausea or vomiting. 06/27/18   Molpus, John, MD  predniSONE (STERAPRED UNI-PAK 21 TAB) 10 MG (21) TBPK tablet 6 tabs for 1 day, then 5 tabs for 1 das, then 4 tabs for 1 day, then 3 tabs for 1 day, 2 tabs for 1 day, then 1 tab for 1 day 07/15/18   Orvan July, NP    Family History Family History   Problem Relation Age of Onset  . Hyperlipidemia Mother   . Diabetes Father   . Diabetes Maternal Uncle   . Diabetes Paternal Grandmother   . Asthma Daughter   . Allergic rhinitis Neg Hx   . Angioedema Neg Hx   . Eczema Neg Hx   . Immunodeficiency Neg Hx   . Urticaria Neg Hx     Social History Social History   Tobacco Use  . Smoking status: Never Smoker  . Smokeless tobacco: Never Used  Substance Use Topics  . Alcohol use: No  . Drug use: No     Allergies   Lupine bean extract; Morphine; and Ivp dye [iodinated diagnostic agents]   Review of Systems Review of Systems   Physical Exam Triage Vital Signs ED Triage Vitals [07/15/18 1159]  Enc Vitals Group     BP 117/64     Pulse Rate 72     Resp 18     Temp 97.9 F (36.6 C)     Temp Source Oral     SpO2 100 %     Weight      Height      Head Circumference      Peak Flow      Pain Score 5     Pain Loc      Pain Edu?      Excl. in [place]?    No data found.  Updated Vital Signs BP 117/64 (BP Location: Right Arm)   Pulse 72   Temp 97.9 F (36.6 C) (Oral)   Resp 18   LMP 06/16/2018 (Approximate)   SpO2 100%   Visual Acuity Right Eye Distance:   Left Eye Distance:   Bilateral Distance:    Right Eye Near:   Left Eye Near:    Bilateral Near:     Physical Exam  Constitutional: She appears well-developed and well-nourished.  Very pleasant. Non toxic or ill appearing.   HENT:  Head: Normocephalic and atraumatic.  Bilateral TMs normal.  External ears normal.  Without posterior oropharyngeal erythema, tonsillar swelling or exudates. No lesions.   No lymphadenopathy.     Eyes: Conjunctivae are normal.  Neck: Normal range of motion.  Cardiovascular: Normal rate, regular rhythm and normal heart sounds.  Pulmonary/Chest: Effort normal.  Coarse lung sounds throughout lung fields.   Musculoskeletal: Normal range of motion.  Neurological: She is alert.  Skin: Skin is warm and dry. No rash noted. No  erythema. No pallor.  Psychiatric: She has a normal mood and affect.  Nursing note and vitals reviewed.  UC Treatments / Results  Labs (all labs ordered are listed, but only abnormal results are displayed) Labs Reviewed - No data to display  EKG None  Radiology No results found.  Procedures Procedures (including critical care time)  Medications Ordered in UC Medications - No data to display  Initial Impression / Assessment and Plan / UC Course  I have reviewed the triage vital signs and the nursing notes.  Pertinent labs & imaging results that were available during my care of the patient were reviewed by me and considered in my medical decision making (see chart for details).     We will treat for asthma exacerbation with prednisone taper, Tessalon Perles for cough and albuterol inhaler as needed for cough, wheezing, shortness of breath. Follow up as needed for continued or worsening symptoms  Final Clinical Impressions(s) / UC Diagnoses   Final diagnoses:  Mild intermittent asthma with exacerbation     Discharge Instructions     We are treating you for asthma exacerbation 6 days of prednisone, use albuterol inhaler as needed for wheezing, shortness of breath, coughing Tessalon Perles for cough 3 times a day as needed Follow up as needed for continued or worsening symptoms     ED Prescriptions    Medication Sig Dispense Auth. Provider   predniSONE (STERAPRED UNI-PAK 21 TAB) 10 MG (21) TBPK tablet 6 tabs for 1 day, then 5 tabs for 1 das, then 4 tabs for 1 day, then 3 tabs for 1 day, 2 tabs for 1 day, then 1 tab for 1 day 21 tablet Faryn Sieg A, NP   benzonatate (TESSALON) 100 MG capsule Take 1 capsule (100 mg total) by mouth every 8 (eight) hours. 21 capsule Motty Borin A, NP   albuterol (PROVENTIL HFA;VENTOLIN HFA) 108 (90 Base) MCG/ACT inhaler Inhale 1-2 puffs into the lungs every 6 (six) hours as needed for wheezing or shortness of breath. 1 Inhaler Loura Halt A, NP     Controlled Substance Prescriptions [place] Controlled Substance Registry consulted? Not Applicable   Orvan July, NP 07/15/18 1254

## 2018-07-15 NOTE — ED Triage Notes (Signed)
Pt sts asthma sx x 3 days; pt sts using inhaler at home

## 2018-07-15 NOTE — ED Triage Notes (Signed)
Patient here from urgent care with complaints of SOB x3 days. Nonproductive cough. Hx of asthma.

## 2018-07-15 NOTE — Discharge Instructions (Addendum)
We are treating you for asthma exacerbation 6 days of prednisone, use albuterol inhaler as needed for wheezing, shortness of breath, coughing Tessalon Perles for cough 3 times a day as needed Follow up as needed for continued or worsening symptoms

## 2018-07-15 NOTE — ED Notes (Signed)
Patient called for fast track with no answer x1.

## 2018-07-16 LAB — BASIC METABOLIC PANEL
ANION GAP: 9 (ref 5–15)
BUN: 15 mg/dL (ref 6–20)
CALCIUM: 8.8 mg/dL — AB (ref 8.9–10.3)
CO2: 22 mmol/L (ref 22–32)
Chloride: 108 mmol/L (ref 98–111)
Creatinine, Ser: 0.68 mg/dL (ref 0.44–1.00)
GLUCOSE: 102 mg/dL — AB (ref 70–99)
POTASSIUM: 3.2 mmol/L — AB (ref 3.5–5.1)
SODIUM: 139 mmol/L (ref 135–145)

## 2018-07-16 MED ORDER — POTASSIUM CHLORIDE CRYS ER 20 MEQ PO TBCR: 20.0000 meq | EXTENDED_RELEASE_TABLET | Freq: Every day | ORAL | 0 refills | Status: DC

## 2018-07-16 MED ORDER — AZITHROMYCIN 250 MG PO TABS: 250.0000 mg | ORAL_TABLET | Freq: Every day | ORAL | 0 refills | Status: DC

## 2018-07-16 MED ORDER — DOXYCYCLINE HYCLATE 100 MG PO CAPS: 100.0000 mg | ORAL_CAPSULE | Freq: Two times a day (BID) | ORAL | 0 refills | Status: DC

## 2018-07-16 MED ORDER — DOXYCYCLINE HYCLATE 100 MG PO CAPS: 100.0000 mg | ORAL_CAPSULE | Freq: Two times a day (BID) | ORAL | 0 refills | Status: AC

## 2018-07-16 MED ORDER — AZITHROMYCIN 250 MG PO TABS
500.0000 mg | ORAL_TABLET | Freq: Once | ORAL | Status: AC
  Administered 2018-07-16: 500 mg via ORAL
  Filled 2018-07-16: qty 2

## 2018-07-16 MED ORDER — ALBUTEROL SULFATE HFA 108 (90 BASE) MCG/ACT IN AERS
1.0000 | INHALATION_SPRAY | Freq: Once | RESPIRATORY_TRACT | Status: AC
  Administered 2018-07-16: 1 via RESPIRATORY_TRACT
  Filled 2018-07-16: qty 6.7

## 2018-07-16 MED ORDER — POTASSIUM CHLORIDE CRYS ER 20 MEQ PO TBCR
40.0000 meq | EXTENDED_RELEASE_TABLET | Freq: Once | ORAL | Status: AC
  Administered 2018-07-16: 40 meq via ORAL
  Filled 2018-07-16: qty 2

## 2018-07-16 MED ORDER — DIPHENHYDRAMINE HCL 25 MG PO CAPS
25.0000 mg | ORAL_CAPSULE | Freq: Once | ORAL | Status: AC
  Administered 2018-07-16: 25 mg via ORAL
  Filled 2018-07-16: qty 1

## 2018-07-16 NOTE — ED Provider Notes (Signed)
Feels improved.  VSS.   Montine Circle, PA-C 07/16/18 0200    Lajean Saver, MD 07/16/18 [phone]

## 2018-07-16 NOTE — ED Provider Notes (Signed)
[facility] DEPT Provider Note   CSN: [id number] Arrival date & time: 07/15/18  1811     History   Chief Complaint Chief Complaint  Patient presents with  . Shortness of Breath  . [name]    HPI [facility] [name] is a 32 y.o. female.  HPI  Patient is a 32 year old female with a history of [name], [facility] [name], [facility] [name], [facility] [name] chest pain and shortness of breath.  Patient reports that she has had a nonproductive cough for 1 week, presented to urgent care today due to worsening of the cough.  She had had a negative cardiopulmonary work-up 5 days ago at an outpatient [facility] facility.  Patient is reporting that she feels a burning in her chest, and is pleuritic in nature.  Patient reports that she believes she had a fever of 101 last night.  Denies taking antipyretics.  Patient reports she has been taking albuterol every 4 hours, and has run out of her albuterol inhaler.  Patient has not yet started prednisone and Tessalon Perles from her previous urgent care visit.  Patient denies any history of cardiovascular disease.  No family history of early MI.  Patient denies any recent immobilization, hospitalization, hormone use, cancer treatment, recent surgery, lower extremity edema or calf tenderness.  History obtained with the assistance of [employer] Spanish interpreter, Manuela Schwartz [phone].   Past Medical History:  Diagnosis Date  . Angio-edema   . [name]    Inhaler used 01/02/16  . [facility] [name]   . Nausea & vomiting 04/02/2017  . Pancreatitis   . Pre-diabetes   . [facility] [name]   . [facility]     Patient Active Problem List   Diagnosis Date Noted  . Cervical radiculopathy   . Left arm weakness   . Left-sided weakness 02/19/2018  . Weakness 02/19/2018  . Rash 02/09/2018  . Chest pain 02/08/2018  . Acute pancreatitis 04/02/2017  . Abdominal pain, acute, left upper quadrant 04/02/2017  . Intractable nausea and vomiting  04/02/2017  . [name] in adult 04/02/2017  . Obesity (BMI 30.0-34.9) 04/02/2017  . Fatty infiltration of liver 04/02/2017  . Gastroesophageal reflux disease without esophagitis   . Pre-diabetes 07/15/2015  . Diarrhea   . Hypokalemia   . Bacterial vaginosis   . [facility] [name] 07/07/2015  . Abdominal pain, right upper quadrant   . [name] 10/31/2014  . History of preterm delivery 10/22/2014  . Status post repeat low transverse cesarean section 09/06/2014  . [facility] [name] 09/10/2012    Past Surgical History:  Procedure Laterality Date  . CESAREAN SECTION    . CESAREAN SECTION N/A 2006  . CESAREAN SECTION N/A 09/03/2014   Procedure: CESAREAN SECTION;  Surgeon: Guss Bunde, MD;  Location: [facility];  Service: Obstetrics;  Laterality: N/A;  . CHOLECYSTECTOMY    . ESOPHAGOGASTRODUODENOSCOPY (EGD) WITH PROPOFOL Left 04/07/2017   Procedure: ESOPHAGOGASTRODUODENOSCOPY (EGD) WITH PROPOFOL;  Surgeon: Ronnette Juniper, MD;  Location: [place];  Service: Gastroenterology;  Laterality: Left;  . TUBAL LIGATION       OB History    Gravida  5   Para  3   Term  1   Preterm  2   AB  2   Living  3     SAB  2   TAB      Ectopic      Multiple  1   Live Births  3            Home Medications    Prior to Admission  medications   Medication Sig Start Date End Date Taking? Authorizing Provider  albuterol (PROVENTIL HFA;VENTOLIN HFA) 108 (90 Base) MCG/ACT inhaler Inhale 1-2 puffs into the lungs every 6 (six) hours as needed for wheezing or shortness of breath. 07/15/18   Bast, Tressia Miners A, NP  azithromycin (ZITHROMAX) 250 MG tablet Take 1 tablet (250 mg total) by mouth daily. 07/16/18   Langston Masker B, PA-C  benzonatate (TESSALON) 100 MG capsule Take 1 capsule (100 mg total) by mouth every 8 (eight) hours. 07/15/18   Loura Halt A, NP  ibuprofen (ADVIL,MOTRIN) 200 MG tablet Take 400 mg by mouth daily as needed for moderate pain.    [provider]  metroNIDAZOLE (FLAGYL) 500  MG tablet Take 1 tablet (500 mg total) by mouth 2 (two) times daily. Patient not taking: Reported on 07/15/2018 06/29/18   Darlina Rumpf, CNM  omeprazole (PRILOSEC) 20 MG capsule Take 1 capsule (20 mg total) by mouth daily. 06/27/18   Molpus, John, MD  ondansetron (ZOFRAN ODT) 8 MG disintegrating tablet Take 1 tablet (8 mg total) by mouth every 8 (eight) hours as needed for nausea or vomiting. 06/27/18   Molpus, John, MD  potassium chloride SA (K-DUR,KLOR-CON) 20 MEQ tablet Take 1 tablet (20 mEq total) by mouth daily. 07/16/18   Langston Masker B, PA-C  predniSONE (STERAPRED UNI-PAK 21 TAB) 10 MG (21) TBPK tablet 6 tabs for 1 day, then 5 tabs for 1 das, then 4 tabs for 1 day, then 3 tabs for 1 day, 2 tabs for 1 day, then 1 tab for 1 day 07/15/18   Orvan July, NP    Family History Family History  Problem Relation Age of Onset  . Hyperlipidemia Mother   . Diabetes Father   . Diabetes Maternal Uncle   . Diabetes Paternal Grandmother   . [name] Daughter   . Allergic rhinitis Neg Hx   . Angioedema Neg Hx   . Eczema Neg Hx   . Immunodeficiency Neg Hx   . [facility] Neg Hx     Social History Social History   Tobacco Use  . Smoking status: Never Smoker  . Smokeless tobacco: Never Used  Substance Use Topics  . Alcohol use: No  . Drug use: No     Allergies   Lupine bean extract; Morphine; and Ivp dye [iodinated diagnostic agents]   Review of Systems Review of Systems  Constitutional: Negative for chills and fever.  HENT: Negative for congestion, rhinorrhea and sore throat.   Eyes: Negative for visual disturbance.  Respiratory: Positive for cough, chest tightness and shortness of breath.   Cardiovascular: Positive for chest pain. Negative for palpitations and leg swelling.  Gastrointestinal: Negative for abdominal pain, nausea and vomiting.  Genitourinary: Negative for dysuria and flank pain.  Musculoskeletal: Negative for back pain and myalgias.  Skin: Negative for  rash.  Allergic/Immunologic: Negative for immunocompromised state.  Neurological: Negative for dizziness, syncope and light-headedness.     Physical Exam Updated Vital Signs BP 125/88 (BP Location: Left Arm)   Pulse 88   Temp 98.4 F (36.9 C) (Oral)   Resp 16   Ht 5' (1.524 m)   Wt 84.8 kg   LMP 06/16/2018 (Approximate)   SpO2 99%   BMI 36.52 kg/m   Physical Exam  Constitutional: She appears well-developed and well-nourished. No distress.  HENT:  Head: Normocephalic and atraumatic.  Mouth/Throat: Oropharynx is clear and moist.  No erythema posterior pharynx.  Eyes: Pupils are equal, round, and reactive to  light. Conjunctivae and EOM are normal.  Neck: Normal range of motion. Neck supple.  Cardiovascular: Normal rate, regular rhythm, S1 normal, S2 normal and intact distal pulses.  No murmur heard. No lower extremity edema.  No calf tenderness.  Pulmonary/Chest: Effort normal and breath sounds normal. She has no wheezes. She has no rales.  Abdominal: Soft. She exhibits no distension. There is no tenderness. There is no guarding.  Musculoskeletal: Normal range of motion. She exhibits no edema or deformity.  Lymphadenopathy:    She has no cervical adenopathy.  Neurological: She is alert.  Cranial nerves grossly intact. Patient moves extremities symmetrically and with good coordination.  Skin: Skin is warm and dry. No rash noted. No erythema.  Psychiatric: She has a normal mood and affect. Her behavior is normal. Judgment and thought content normal.  Nursing note and vitals reviewed.    ED Treatments / Results  Labs (all labs ordered are listed, but only abnormal results are displayed) Labs Reviewed  BASIC METABOLIC PANEL - Abnormal; Notable for the following components:      Result Value   Potassium 3.2 (*)    Glucose, Bld 102 (*)    Calcium 8.8 (*)    All other components within normal limits  CBC WITH DIFFERENTIAL/PLATELET  I-STAT BETA HCG BLOOD, ED ([facility], [facility], [facility]  ONLY)  I-STAT TROPONIN, ED    EKG EKG Interpretation  Date/Time:  Tuesday July 15 2018 18:31:18 EDT Ventricular Rate:  106 PR Interval:    QRS Duration: 90 QT Interval:  356 QTC Calculation: 478 R Axis:   60 Text Interpretation:  Sinus tachycardia Low voltage, precordial leads Probable anterolateral infarct, recent Baseline wander in lead(s) V3 No significant change since last tracing Confirmed by Wandra Arthurs [phone]) on 07/15/2018 10:45:10 PM   Radiology Dg Chest 2 View  Result Date: 07/15/2018 CLINICAL DATA:  Cough and shortness of breath EXAM: CHEST - 2 VIEW COMPARISON:  02/08/2018 FINDINGS: The heart size and mediastinal contours are within normal limits. Both lungs are clear. The visualized skeletal structures are unremarkable. IMPRESSION: No active cardiopulmonary disease. Electronically Signed   By: Ulyses Jarred M.D.   On: 07/15/2018 20:22    Procedures Procedures (including critical care time)  Medications Ordered in ED Medications  albuterol (PROVENTIL HFA;VENTOLIN HFA) 108 (90 Base) MCG/ACT inhaler 1 puff (has no administration in time range)  diphenhydrAMINE (BENADRYL) capsule 25 mg (has no administration in time range)  albuterol (PROVENTIL) (2.5 MG/3ML) 0.083% nebulizer solution 5 mg (5 mg Nebulization Given 07/15/18 1842)  ipratropium-albuterol (DUONEB) 0.5-2.5 (3) MG/3ML nebulizer solution 3 mL (3 mLs Nebulization Given 07/15/18 2250)  potassium chloride SA (K-DUR,KLOR-CON) CR tablet 40 mEq (40 mEq Oral Given 07/16/18 0018)  azithromycin (ZITHROMAX) tablet 500 mg (500 mg Oral Given 07/16/18 0018)     Initial Impression / Assessment and Plan / ED Course  I have reviewed the triage vital signs and the nursing notes.  Pertinent labs & imaging results that were available during my care of the patient were reviewed by me and considered in my medical decision making (see chart for details).  Clinical Course as of Jul 16 117  Wed Jul 16, 2018  0038 Patient  reporting she is "itchy". Assessed and no evidence of [facility] or swelling in throat. Will administer Bendadryl and sign out to be reassessed by night provider. Pt has previously tolerated azithro but will switch to doxycycline.   [AM]    Clinical Course User Index [AM] Albesa Seen, PA-C  Patient is nontoxic-appearing and in no acute distress.  Patient with initial tachycardic reading of 127, however this normalized when patient was resting comfortably on my examination.  Differential diagnosis includes bronchitis, pneumonia, pulmonary embolism, anxiety.  Normal lung exam without wheezes or rales.  Patient had negative d-dimer with level of 0.375 days ago.  Patient is PERC and Well's negative on my examination after tachycardia resolved, therefore feel the pretest probability is low for pulmonary embolism, and repeat is not warranted.  Laboratory analysis significant for hypokalemia 3.2.  This is likely secondary to albuterol use.  Repleted in the emergency department, and short course of potassium continued as patient continues to use albuterol.  There is no leukocytosis.  Troponin is negative.  Patient is not pregnant.  EKG without evidence of acute ischemia, infarction, or arrhythmia.  Chest x-ray without cardiopulmonary abnormality.  Given subjective fevers, will prescribe antibiotics for short course for possible early pneumonia.  Patient does not appear to be decompensating from [name].  Patient is on outpatient prednisone and Tessalon Perles for urgent care.  Encouraged to continue.  Care signed out to rub running, PA-C at change of shift to assess for improvement in patient's pruritus after Bendadryl. Do not suspect secondary to antibiotics as symptoms started almost immediately after antibiotic administration, but will change antibiotic.  This is a supervised visit with Dr. Shirlyn Goltz. Evaluation, management, and discharge planning discussed with this attending physician.  Final  Clinical Impressions(s) / ED Diagnoses   Final diagnoses:  [name] chest pain  Bronchitis  Mild intermittent [name] with exacerbation      Albesa Seen, PA-C 07/16/18 0121    Drenda Freeze, MD 07/16/18 1600

## 2018-07-16 NOTE — Discharge Instructions (Addendum)
Te vieron hoy por asma e infeccin pulmonar. Te trataremos con [employer]. Por favor, llnelos en una farmacia.   Use su inhalador cada 4 horas segn sea necesario para sibilancias/tos. Por favor, tome la prednisona prescrita para la atencin de [employer]. Tambin puede tomar el Tessalon Perles prescrito por atencin de [employer], pero por favor mantngalos alejados de los nios pequeos.   Vaya a la sala de emergencias para cualquier empeoramiento del dolor en el pecho o dificultad para respirar.  You were seen today from asthma and lung infection. We'll treat you with five days of antibiotics. Please fill them up at a pharmacy.   Use your inhaler every 4 hours as needed for wheezing/coughing. Please take the prescribed prednisone for emergency care. You can also take the Tessalon Perles prescribed by emergency care, but please keep them away from young children.   Go to the emergency room for any worsening of chest pain or shortness of breath.

## 2018-09-11 ENCOUNTER — Emergency Department (HOSPITAL_COMMUNITY)
Admission: EM | Admit: 2018-09-11 | Discharge: 2018-09-12 | Disposition: A | Discharge status: Home / Self Care | Payer: Self-pay | Attending: Emergency Medicine | Admitting: Emergency Medicine

## 2018-09-11 ENCOUNTER — Other Ambulatory Visit: Payer: Self-pay

## 2018-09-11 ENCOUNTER — Encounter (HOSPITAL_COMMUNITY): Payer: Self-pay

## 2018-09-11 DIAGNOSIS — R109 Unspecified abdominal pain: Secondary | ICD-10-CM

## 2018-09-11 DIAGNOSIS — R1031 Right lower quadrant pain: Secondary | ICD-10-CM | POA: Insufficient documentation

## 2018-09-11 DIAGNOSIS — J45909 Unspecified asthma, uncomplicated: Secondary | ICD-10-CM | POA: Insufficient documentation

## 2018-09-11 LAB — CBC
HCT: 38.3 % (ref 36.0–46.0)
Hemoglobin: 12.5 g/dL (ref 12.0–15.0)
MCH: 29.3 pg (ref 26.0–34.0)
MCHC: 32.6 g/dL (ref 30.0–36.0)
MCV: 89.7 fL (ref 80.0–100.0)
PLATELETS: 275 10*3/uL (ref 150–400)
RBC: 4.27 MIL/uL (ref 3.87–5.11)
RDW: 12.2 % (ref 11.5–15.5)
WBC: 9.5 10*3/uL (ref 4.0–10.5)
nRBC: 0 % (ref 0.0–0.2)

## 2018-09-11 LAB — I-STAT BETA HCG BLOOD, ED (MC, WL, AP ONLY): I-stat hCG, quantitative: <5 m[IU]/mL (ref <5)

## 2018-09-11 LAB — URINALYSIS, ROUTINE W REFLEX MICROSCOPIC
Bilirubin Urine: NEGATIVE
Glucose, UA: NEGATIVE mg/dL
Ketones, ur: NEGATIVE mg/dL
Leukocytes, UA: NEGATIVE
Nitrite: NEGATIVE
PH: 7 (ref 5.0–8.0)
Protein, ur: NEGATIVE mg/dL
Specific Gravity, Urine: 1.01 (ref 1.005–1.030)

## 2018-09-11 NOTE — ED Triage Notes (Signed)
Pt here with central abdominal pain that radiates to the right lower side.  Hx of gal bladder surgery.  Denies any urinary symptoms but says when she eats the pain is worse.  A&Ox4.

## 2018-09-12 ENCOUNTER — Emergency Department (HOSPITAL_COMMUNITY): Payer: Self-pay

## 2018-09-12 LAB — COMPREHENSIVE METABOLIC PANEL
ALT: 40 U/L (ref 0–44)
AST: 37 U/L (ref 15–41)
Albumin: 3.8 g/dL (ref 3.5–5.0)
Alkaline Phosphatase: 81 U/L (ref 38–126)
Anion gap: 6 (ref 5–15)
BILIRUBIN TOTAL: 1 mg/dL (ref 0.3–1.2)
BUN: 13 mg/dL (ref 6–20)
CO2: 29 mmol/L (ref 22–32)
Calcium: 9 mg/dL (ref 8.9–10.3)
Chloride: 106 mmol/L (ref 98–111)
Creatinine, Ser: 0.88 mg/dL (ref 0.44–1.00)
GFR calc Af Amer: >60 mL/min (ref >60)
GFR calc non Af Amer: >60 mL/min (ref >60)
Glucose, Bld: 97 mg/dL (ref 70–99)
Potassium: 3.7 mmol/L (ref 3.5–5.1)
Sodium: 141 mmol/L (ref 135–145)
TOTAL PROTEIN: 7 g/dL (ref 6.5–8.1)

## 2018-09-12 LAB — LIPASE, BLOOD: Lipase: 31 U/L (ref 11–51)

## 2018-09-12 MED ORDER — METHYLPREDNISOLONE SODIUM SUCC 125 MG IJ SOLR
125.0000 mg | Freq: Once | INTRAMUSCULAR | Status: AC
  Administered 2018-09-12: 125 mg via INTRAVENOUS
  Filled 2018-09-12: qty 2

## 2018-09-12 MED ORDER — SODIUM CHLORIDE 0.9 % IV BOLUS
1000.0000 mL | Freq: Once | INTRAVENOUS | Status: AC
  Administered 2018-09-12: 1000 mL via INTRAVENOUS

## 2018-09-12 MED ORDER — FENTANYL CITRATE (PF) 100 MCG/2ML IJ SOLN
50.0000 ug | Freq: Once | INTRAMUSCULAR | Status: AC
  Administered 2018-09-12: 50 ug via INTRAVENOUS
  Filled 2018-09-12: qty 2

## 2018-09-12 MED ORDER — BARIUM SULFATE 2.1 % PO SUSP
ORAL | Status: AC
  Filled 2018-09-12: qty 2

## 2018-09-12 MED ORDER — ONDANSETRON HCL 4 MG/2ML IJ SOLN
4.0000 mg | Freq: Once | INTRAMUSCULAR | Status: AC
  Administered 2018-09-12: 4 mg via INTRAVENOUS
  Filled 2018-09-12: qty 2

## 2018-09-12 MED ORDER — FAMOTIDINE IN NACL 20-0.9 MG/50ML-% IV SOLN
20.0000 mg | INTRAVENOUS | Status: AC
  Administered 2018-09-12: 20 mg via INTRAVENOUS
  Filled 2018-09-12: qty 50

## 2018-09-12 MED ORDER — HYDROMORPHONE HCL 1 MG/ML IJ SOLN
1.0000 mg | Freq: Once | INTRAMUSCULAR | Status: AC
  Administered 2018-09-12: 1 mg via INTRAVENOUS
  Filled 2018-09-12: qty 1

## 2018-09-12 MED ORDER — METOCLOPRAMIDE HCL 10 MG PO TABS: 10.0000 mg | ORAL_TABLET | Freq: Four times a day (QID) | ORAL | 0 refills | Status: DC

## 2018-09-12 MED ORDER — DIPHENHYDRAMINE HCL 50 MG/ML IJ SOLN
INTRAMUSCULAR | Status: AC
  Administered 2018-09-12: 50 mg
  Filled 2018-09-12: qty 1

## 2018-09-12 NOTE — ED Notes (Addendum)
As soon as RN gave IV medications, pt began  To complain of itchy arm...then her throat became "itchy"  Provider notified and bedside.  Ordered 50mg  Benadryl.  Given.  No respiratory distress noted.

## 2018-09-12 NOTE — ED Notes (Signed)
Spoke to provider regarding Pt's c/o "itchiness"  Will order additional mediations, will give 20 minutes apart.  Ordered to hold contrast until pt feels better.

## 2018-09-12 NOTE — ED Provider Notes (Signed)
[facility] EMERGENCY DEPARTMENT Provider Note   CSN: [id number] Arrival date & time: 09/11/18  2242     History   Chief Complaint Chief Complaint  Patient presents with  . Abdominal Pain    HPI [facility] [name] is a 32 y.o. female with a hx of edema, asthma, pancreatitis, pyelonephritis presents to the Emergency Department complaining of gradual, persistent, progressively worsening lower quadrant abdominal pain onset yesterday.  Patient reports the pain is a 6/10 and sharp in nature.  She reports that radiates in the central portion down into her right lower quadrant.  Patient reports that eating makes her pain worse but nothing makes it better.  She reports 4 episodes of emesis over the last 12 hours.  Her last episode of end of emesis was this evening.  Emesis is nonbloody and nonbilious.  Patient denies headache, neck pain, fever, chills, chest pain, shortness of breath, diarrhea, weakness, dizziness, syncope, dysuria, hematuria, urinary urgency, urinary frequency.  No international travel or known sick contacts.  No treatments prior to arrival.  She does have a history of cholecystectomy.   The history is provided by the patient, medical records and a significant other. No language interpreter was used.    Past Medical History:  Diagnosis Date  . Angio-edema   . Asthma    Inhaler used 01/02/16  . Diverticulitis   . Nausea & vomiting 04/02/2017  . Pancreatitis   . Pre-diabetes   . Pyelonephritis   . Urticaria     Patient Active Problem List   Diagnosis Date Noted  . Cervical radiculopathy   . Left arm weakness   . Left-sided weakness 02/19/2018  . Weakness 02/19/2018  . Rash 02/09/2018  . Chest pain 02/08/2018  . Acute pancreatitis 04/02/2017  . Abdominal pain, acute, left upper quadrant 04/02/2017  . Intractable nausea and vomiting 04/02/2017  . Asthma in adult 04/02/2017  . Obesity (BMI 30.0-34.9) 04/02/2017  . Fatty infiltration of liver  04/02/2017  . Gastroesophageal reflux disease without esophagitis   . Pre-diabetes 07/15/2015  . Diarrhea   . Hypokalemia   . Bacterial vaginosis   . Pyelonephritis 07/07/2015  . Abdominal pain, right upper quadrant   . Asthma 10/31/2014  . History of preterm delivery 10/22/2014  . Status post repeat low transverse cesarean section 09/06/2014  . Diverticulitis 09/10/2012    Past Surgical History:  Procedure Laterality Date  . CESAREAN SECTION    . CESAREAN SECTION N/A 2006  . CESAREAN SECTION N/A 09/03/2014   Procedure: CESAREAN SECTION;  Surgeon: Guss Bunde, MD;  Location: [facility];  Service: Obstetrics;  Laterality: N/A;  . CHOLECYSTECTOMY    . ESOPHAGOGASTRODUODENOSCOPY (EGD) WITH PROPOFOL Left 04/07/2017   Procedure: ESOPHAGOGASTRODUODENOSCOPY (EGD) WITH PROPOFOL;  Surgeon: Ronnette Juniper, MD;  Location: [place];  Service: Gastroenterology;  Laterality: Left;  . TUBAL LIGATION       OB History    Gravida  5   Para  3   Term  1   Preterm  2   AB  2   Living  3     SAB  2   TAB      Ectopic      Multiple  1   Live Births  3            Home Medications    Prior to Admission medications   Medication Sig Start Date End Date Taking? Authorizing Provider  albuterol (PROVENTIL HFA;VENTOLIN HFA) 108 (90 Base) MCG/ACT inhaler Inhale  1-2 puffs into the lungs every 6 (six) hours as needed for wheezing or shortness of breath. Patient not taking: Reported on 09/12/2018 07/15/18   Loura Halt A, NP  benzonatate (TESSALON) 100 MG capsule Take 1 capsule (100 mg total) by mouth every 8 (eight) hours. Patient not taking: Reported on 09/12/2018 07/15/18   Loura Halt A, NP  metroNIDAZOLE (FLAGYL) 500 MG tablet Take 1 tablet (500 mg total) by mouth 2 (two) times daily. Patient not taking: Reported on 07/15/2018 06/29/18   Darlina Rumpf, CNM  omeprazole (PRILOSEC) 20 MG capsule Take 1 capsule (20 mg total) by mouth daily. Patient not taking: Reported on  09/12/2018 06/27/18   Molpus, Jenny Reichmann, MD  ondansetron (ZOFRAN ODT) 8 MG disintegrating tablet Take 1 tablet (8 mg total) by mouth every 8 (eight) hours as needed for nausea or vomiting. Patient not taking: Reported on 09/12/2018 06/27/18   Molpus, John, MD  potassium chloride SA (K-DUR,KLOR-CON) 20 MEQ tablet Take 1 tablet (20 mEq total) by mouth daily. Patient not taking: Reported on 09/12/2018 07/16/18   Langston Masker B, PA-C  predniSONE (STERAPRED UNI-PAK 21 TAB) 10 MG (21) TBPK tablet 6 tabs for 1 day, then 5 tabs for 1 das, then 4 tabs for 1 day, then 3 tabs for 1 day, 2 tabs for 1 day, then 1 tab for 1 day Patient not taking: Reported on 09/12/2018 07/15/18   Orvan July, NP    Family History Family History  Problem Relation Age of Onset  . Hyperlipidemia Mother   . Diabetes Father   . Diabetes Maternal Uncle   . Diabetes Paternal Grandmother   . Asthma Daughter   . Allergic rhinitis Neg Hx   . Angioedema Neg Hx   . Eczema Neg Hx   . Immunodeficiency Neg Hx   . Urticaria Neg Hx     Social History Social History   Tobacco Use  . Smoking status: Never Smoker  . Smokeless tobacco: Never Used  Substance Use Topics  . Alcohol use: No  . Drug use: No     Allergies   Lupine bean extract; Morphine; and Ivp dye [iodinated diagnostic agents]   Review of Systems Review of Systems  Constitutional: Negative for appetite change, diaphoresis, fatigue, fever and unexpected weight change.  HENT: Negative for mouth sores.   Eyes: Negative for visual disturbance.  Respiratory: Negative for cough, chest tightness, shortness of breath and wheezing.   Cardiovascular: Negative for chest pain.  Gastrointestinal: Positive for abdominal pain, nausea and vomiting. Negative for constipation and diarrhea.  Endocrine: Negative for polydipsia, polyphagia and polyuria.  Genitourinary: Negative for dysuria, frequency, hematuria and urgency.  Musculoskeletal: Negative for back pain and neck  stiffness.  Skin: Negative for rash.  Allergic/Immunologic: Negative for immunocompromised state.  Neurological: Negative for syncope, light-headedness and headaches.  Hematological: Does not bruise/bleed easily.  Psychiatric/Behavioral: Negative for sleep disturbance. The patient is not nervous/anxious.      Physical Exam Updated Vital Signs BP (!) 115/97   Pulse (!) 102   Temp 98.7 F (37.1 C) (Oral)   Resp 12   SpO2 98%   Physical Exam Vitals signs and nursing note reviewed.  Constitutional:      General: She is not in acute distress.    Appearance: She is well-developed. She is not diaphoretic.     Comments: Awake, alert, nontoxic appearance  HENT:     Head: Normocephalic and atraumatic.     Mouth/Throat:     Pharynx: No  oropharyngeal exudate.  Eyes:     General: No scleral icterus.    Conjunctiva/sclera: Conjunctivae normal.  Neck:     Musculoskeletal: Normal range of motion and neck supple.  Cardiovascular:     Rate and Rhythm: Normal rate and regular rhythm.  Pulmonary:     Effort: Pulmonary effort is normal. No respiratory distress.     Breath sounds: Normal breath sounds. No wheezing.  Abdominal:     General: Bowel sounds are normal.     Palpations: Abdomen is soft. There is no mass.     Tenderness: There is abdominal tenderness in the right lower quadrant and periumbilical area. There is guarding. There is no right CVA tenderness, left CVA tenderness or rebound.  Musculoskeletal: Normal range of motion.  Skin:    General: Skin is warm and dry.  Neurological:     Mental Status: She is alert.     Comments: Speech is clear and goal oriented Moves extremities without ataxia      ED Treatments / Results  Labs (all labs ordered are listed, but only abnormal results are displayed) Labs Reviewed  URINALYSIS, ROUTINE W REFLEX MICROSCOPIC - Abnormal; Notable for the following components:      Result Value   Color, Urine STRAW (*)    Hgb urine dipstick  MODERATE (*)    Bacteria, UA RARE (*)    All other components within normal limits  LIPASE, BLOOD  COMPREHENSIVE METABOLIC PANEL  CBC  I-STAT BETA HCG BLOOD, ED ([facility], [facility], [facility] ONLY)    Procedures Procedures (including critical care time)  Medications Ordered in ED Medications  Barium Sulfate 2.1 % SUSP (has no administration in time range)  HYDROmorphone (DILAUDID) injection 1 mg (has no administration in time range)  sodium chloride 0.9 % bolus 1,000 mL (0 mLs Intravenous Stopped 09/12/18 0432)  fentaNYL (SUBLIMAZE) injection 50 mcg (50 mcg Intravenous Given 09/12/18 0332)  ondansetron (ZOFRAN) injection 4 mg (4 mg Intravenous Given 09/12/18 0332)  diphenhydrAMINE (BENADRYL) 50 MG/ML injection (50 mg  Given 09/12/18 0344)  famotidine (PEPCID) IVPB 20 mg premix (0 mg Intravenous Stopped 09/12/18 0503)  methylPREDNISolone sodium succinate (SOLU-MEDROL) 125 mg/2 mL injection 125 mg (125 mg Intravenous Given 09/12/18 0421)     Initial Impression / Assessment and Plan / ED Course  I have reviewed the triage vital signs and the nursing notes.  Pertinent labs & imaging results that were available during my care of the patient were reviewed by me and considered in my medical decision making (see chart for details).     Presents with right-sided and right lower abdominal pain.  He has a history of cholecystectomy but no appendectomy.  Tenderness and guarding on exam.  Patient uncomfortable in the bed.  Concern for possible appendicitis versus renal colic.  Labs are reassuring.  Moderate hemoglobin in her urine.  No history of nephrolithiasis.  Patient pending CT scan.  7:19 AM At shift change care was transferred to [facility], PA-C who will follow pending studies, re-evaulate and determine disposition.     Final Clinical Impressions(s) / ED Diagnoses   Final diagnoses:  Right lower quadrant abdominal pain    ED Discharge Orders    None       Loni Muse Gwenlyn Perking 16/10/96 0454    Delora Fuel, MD 09/81/19 [phone]

## 2018-09-12 NOTE — ED Notes (Signed)
Started drinking contrast.

## 2018-09-12 NOTE — ED Provider Notes (Signed)
32 year old female received at sign out from [place] pending imaging and pain control. Per her HPI:  "Lorraine [name] is a 32 y.o. female with a hx of edema, asthma, pancreatitis, pyelonephritis presents to the Emergency Department complaining of gradual, persistent, progressively worsening lower quadrant abdominal pain onset yesterday.  Patient reports the pain is a 6/10 and sharp in nature.  She reports that radiates in the central portion down into her right lower quadrant.  Patient reports that eating makes her pain worse but nothing makes it better.  She reports 4 episodes of emesis over the last 12 hours.  Her last episode of end of emesis was this evening.  Emesis is nonbloody and nonbilious.  Patient denies headache, neck pain, fever, chills, chest pain, shortness of breath, diarrhea, weakness, dizziness, syncope, dysuria, hematuria, urinary urgency, urinary frequency.  No international travel or known sick contacts.  No treatments prior to arrival.  She does have a history of cholecystectomy."  During my interview, the patient states she has had similar right-sided abdominal pain in the past, but no nausea at that time.  Spanish interpreter utilized during all interactions with the patient.   Physical Exam  BP 114/64   Pulse 73   Temp 98.7 F (37.1 C) (Oral)   Resp 12   SpO2 98%   Physical Exam  TTP in the right mid-abdomen.  No rebound or guarding.  No tenderness over McBurney's point.  Negative Murphy sign.  No CVA tenderness bilaterally.  Abdomen is soft, nondistended.  ED Course/Procedures     Procedures  MDM   32 year old female with a history of edema, asthma, pancreatitis, and pyelonephritis presenting with right central abdominal pain that radiates into her right lower quadrant that began yesterday with severe nausea.  She has a history of anaphylaxis with morphine and developed itching with a fentanyl administration.  Medical record reviewed.  She has tolerated  Dilaudid in the ED previously without difficulty.  No signs or symptoms of allergic reaction to Dilaudid today in the ED.  CT abdomen pelvis with mild bladder distention, but otherwise unremarkable.  Doubt appendicitis or ureterolithiasis.  The patient has a longstanding history of chronic abdominal pain.  She was previously referred to GI, but has not followed up due to her insurance status.  She had a pelvic ultrasound in the last few months, which was unremarkable.  Low suspicion for ovarian torsion today.  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  Will send urine culture although a low suspicion for infectious etiology as she is not having urinary sx.  She should follow-up with primary care regarding hemoglobinuria.   She is feeling improved since arrival. She is established with [facility].  I have advised her to follow-up with them in the clinic.  She was successfully fluid challenged in the ED.  Strict return precautions given.  She is hemodynamically stable and in no acute distress.  She is safe for discharge home with outpatient follow-up at this time.     Joanne Gavel, PA-C 09/12/18 1159    Dorie Rank, MD 09/15/18 [phone]

## 2018-09-12 NOTE — Discharge Instructions (Addendum)
Le han visto en el Departamento de Emergencias (DE) por dolor abdominal. Su evaluacin no identific una causa clara de sus sntomas, pero en general fue tranquilizadora.  Llame para programar una cita de seguimiento con [facility]. Puede tomar una tableta de Reglan hasta 2 veces al da por nuseas o vmitos. Tome 600 mg de ibuprofeno con alimentos o 650 mg de Tylenol para Financial controller.  Regrese al servicio de urgencias si su dolor abdominal empeora o no mejora, desarrolla vmitos con sangre, diarrea con sangre, no puede tolerar lquidos debido a vmitos, fiebre mayor de 101 u otros sntomas que le preocupan.  You have been seen in the Emergency Department (ED) for abdominal pain.  Your evaluation did not identify a clear cause of your symptoms but was generally reassuring.  Call to schedule a follow up appointment with [facility]. You can take one tablet of Reglan up to 2 times daily for nausea or vomiting.  Take 600 mg of ibuprofen with food or 650 mg of Tylenol for pain control.  Return to the ED if your abdominal pain worsens or fails to improve, you develop bloody vomiting, bloody diarrhea, you are unable to tolerate fluids due to vomiting, fever greater than 101, or other symptoms that concern you.

## 2018-09-13 LAB — URINE CULTURE
Culture: >=100000 — AB
Special Requests: NORMAL

## 2018-09-18 ENCOUNTER — Encounter (HOSPITAL_COMMUNITY): Payer: Self-pay

## 2018-09-18 ENCOUNTER — Other Ambulatory Visit: Payer: Self-pay

## 2018-09-18 ENCOUNTER — Encounter (HOSPITAL_COMMUNITY): Payer: Self-pay | Admitting: Emergency Medicine

## 2018-09-18 ENCOUNTER — Emergency Department (HOSPITAL_COMMUNITY)
Admission: EM | Admit: 2018-09-18 | Discharge: 2018-09-19 | Disposition: A | Discharge status: Home / Self Care | Payer: Self-pay | Attending: Emergency Medicine | Admitting: Emergency Medicine

## 2018-09-18 ENCOUNTER — Ambulatory Visit (HOSPITAL_COMMUNITY)
Admission: EM | Admit: 2018-09-18 | Discharge: 2018-09-18 | Disposition: A | Discharge status: Home / Self Care | Payer: Self-pay | Attending: Family Medicine | Admitting: Family Medicine

## 2018-09-18 DIAGNOSIS — Z5321 Procedure and treatment not carried out due to patient leaving prior to being seen by health care provider: Secondary | ICD-10-CM | POA: Insufficient documentation

## 2018-09-18 LAB — BASIC METABOLIC PANEL
Anion gap: 8 (ref 5–15)
BUN: 15 mg/dL (ref 6–20)
CO2: 26 mmol/L (ref 22–32)
Calcium: 9.2 mg/dL (ref 8.9–10.3)
Chloride: 108 mmol/L (ref 98–111)
Creatinine, Ser: 0.91 mg/dL (ref 0.44–1.00)
GFR calc Af Amer: >60 mL/min (ref >60)
Glucose, Bld: 105 mg/dL — ABNORMAL HIGH (ref 70–99)
Potassium: 3.7 mmol/L (ref 3.5–5.1)
Sodium: 142 mmol/L (ref 135–145)

## 2018-09-18 LAB — I-STAT TROPONIN, ED: Troponin i, poc: 0 ng/mL (ref 0.00–0.08)

## 2018-09-18 LAB — CBC
HCT: 40.9 % (ref 36.0–46.0)
Hemoglobin: 13.2 g/dL (ref 12.0–15.0)
MCH: 29 pg (ref 26.0–34.0)
MCHC: 32.3 g/dL (ref 30.0–36.0)
MCV: 89.9 fL (ref 80.0–100.0)
Platelets: 298 10*3/uL (ref 150–400)
RBC: 4.55 MIL/uL (ref 3.87–5.11)
RDW: 12.2 % (ref 11.5–15.5)
WBC: 7.9 10*3/uL (ref 4.0–10.5)
nRBC: 0 % (ref 0.0–0.2)

## 2018-09-18 LAB — I-STAT BETA HCG BLOOD, ED (MC, WL, AP ONLY)

## 2018-09-18 NOTE — ED Triage Notes (Signed)
Pt presents with complaints of chest pain, nausea, blurred vision, fatigue, and body weakness.

## 2018-09-18 NOTE — ED Notes (Signed)
PT sent to [facility] by Dr. Meda Coffee

## 2018-09-18 NOTE — ED Triage Notes (Signed)
Pt sent from [facility] for evaluation of chest pain, nausea, vomiting and an abnormal EKG.

## 2018-09-19 NOTE — ED Notes (Signed)
No answer for vitals recheck x3

## 2018-09-20 ENCOUNTER — Emergency Department (HOSPITAL_COMMUNITY): Payer: Self-pay

## 2018-09-20 ENCOUNTER — Inpatient Hospital Stay (HOSPITAL_COMMUNITY)
Admission: EM | Admit: 2018-09-20 | Discharge: 2018-09-26 | DRG: 392 | Disposition: A | Discharge status: Home / Self Care | Payer: Self-pay | Attending: Internal Medicine | Admitting: Internal Medicine

## 2018-09-20 ENCOUNTER — Encounter (HOSPITAL_COMMUNITY): Payer: Self-pay

## 2018-09-20 ENCOUNTER — Other Ambulatory Visit: Payer: Self-pay

## 2018-09-20 DIAGNOSIS — E876 Hypokalemia: Secondary | ICD-10-CM | POA: Diagnosis present

## 2018-09-20 DIAGNOSIS — J45909 Unspecified asthma, uncomplicated: Secondary | ICD-10-CM | POA: Diagnosis present

## 2018-09-20 DIAGNOSIS — E669 Obesity, unspecified: Secondary | ICD-10-CM | POA: Diagnosis present

## 2018-09-20 DIAGNOSIS — Z8349 Family history of other endocrine, nutritional and metabolic diseases: Secondary | ICD-10-CM

## 2018-09-20 DIAGNOSIS — Z833 Family history of diabetes mellitus: Secondary | ICD-10-CM

## 2018-09-20 DIAGNOSIS — G8929 Other chronic pain: Secondary | ICD-10-CM | POA: Diagnosis present

## 2018-09-20 DIAGNOSIS — Z6836 Body mass index (BMI) 36.0-36.9, adult: Secondary | ICD-10-CM

## 2018-09-20 DIAGNOSIS — Z825 Family history of asthma and other chronic lower respiratory diseases: Secondary | ICD-10-CM

## 2018-09-20 DIAGNOSIS — R1110 Vomiting, unspecified: Secondary | ICD-10-CM

## 2018-09-20 DIAGNOSIS — B373 Candidiasis of vulva and vagina: Secondary | ICD-10-CM | POA: Diagnosis present

## 2018-09-20 DIAGNOSIS — N760 Acute vaginitis: Secondary | ICD-10-CM

## 2018-09-20 DIAGNOSIS — R112 Nausea with vomiting, unspecified: Secondary | ICD-10-CM | POA: Diagnosis present

## 2018-09-20 DIAGNOSIS — R0789 Other chest pain: Secondary | ICD-10-CM | POA: Diagnosis present

## 2018-09-20 DIAGNOSIS — K5732 Diverticulitis of large intestine without perforation or abscess without bleeding: Principal | ICD-10-CM | POA: Diagnosis present

## 2018-09-20 DIAGNOSIS — K5792 Diverticulitis of intestine, part unspecified, without perforation or abscess without bleeding: Secondary | ICD-10-CM | POA: Diagnosis present

## 2018-09-20 DIAGNOSIS — B9689 Other specified bacterial agents as the cause of diseases classified elsewhere: Secondary | ICD-10-CM

## 2018-09-20 LAB — URINALYSIS, ROUTINE W REFLEX MICROSCOPIC
Bilirubin Urine: NEGATIVE
Glucose, UA: NEGATIVE mg/dL
Hgb urine dipstick: NEGATIVE
Ketones, ur: NEGATIVE mg/dL
Leukocytes, UA: NEGATIVE
Nitrite: NEGATIVE
Protein, ur: NEGATIVE mg/dL
Specific Gravity, Urine: 1.02 (ref 1.005–1.030)
pH: 6 (ref 5.0–8.0)

## 2018-09-20 LAB — CBC
HCT: 41 % (ref 36.0–46.0)
Hemoglobin: 13.2 g/dL (ref 12.0–15.0)
MCH: 28.9 pg (ref 26.0–34.0)
MCHC: 32.2 g/dL (ref 30.0–36.0)
MCV: 89.9 fL (ref 80.0–100.0)
Platelets: 272 10*3/uL (ref 150–400)
RBC: 4.56 MIL/uL (ref 3.87–5.11)
RDW: 12.3 % (ref 11.5–15.5)
WBC: 9.7 10*3/uL (ref 4.0–10.5)
nRBC: 0 % (ref 0.0–0.2)

## 2018-09-20 LAB — COMPREHENSIVE METABOLIC PANEL
ALT: 36 U/L (ref 0–44)
AST: 28 U/L (ref 15–41)
Albumin: 4.3 g/dL (ref 3.5–5.0)
Alkaline Phosphatase: 95 U/L (ref 38–126)
Anion gap: 11 (ref 5–15)
BUN: 17 mg/dL (ref 6–20)
CO2: 24 mmol/L (ref 22–32)
Calcium: 9.3 mg/dL (ref 8.9–10.3)
Chloride: 104 mmol/L (ref 98–111)
Creatinine, Ser: 0.84 mg/dL (ref 0.44–1.00)
GFR calc Af Amer: >60 mL/min (ref >60)
GFR calc non Af Amer: >60 mL/min (ref >60)
Glucose, Bld: 108 mg/dL — ABNORMAL HIGH (ref 70–99)
Potassium: 3.5 mmol/L (ref 3.5–5.1)
Sodium: 139 mmol/L (ref 135–145)
Total Bilirubin: 0.3 mg/dL (ref 0.3–1.2)
Total Protein: 7.4 g/dL (ref 6.5–8.1)

## 2018-09-20 LAB — WET PREP, GENITAL
Sperm: NONE SEEN
Trich, Wet Prep: NONE SEEN

## 2018-09-20 LAB — I-STAT BETA HCG BLOOD, ED (MC, WL, AP ONLY): I-stat hCG, quantitative: <5 m[IU]/mL (ref <5)

## 2018-09-20 LAB — LIPASE, BLOOD: Lipase: 39 U/L (ref 11–51)

## 2018-09-20 MED ORDER — HYDROMORPHONE HCL 1 MG/ML IJ SOLN
1.0000 mg | Freq: Once | INTRAMUSCULAR | Status: AC
  Administered 2018-09-20: 1 mg via INTRAVENOUS
  Filled 2018-09-20: qty 1

## 2018-09-20 MED ORDER — FAMOTIDINE IN NACL 20-0.9 MG/50ML-% IV SOLN
20.0000 mg | Freq: Once | INTRAVENOUS | Status: AC
  Administered 2018-09-20: 20 mg via INTRAVENOUS
  Filled 2018-09-20: qty 50

## 2018-09-20 MED ORDER — SODIUM CHLORIDE 0.9 % IV BOLUS
1000.0000 mL | Freq: Once | INTRAVENOUS | Status: AC
  Administered 2018-09-20: 1000 mL via INTRAVENOUS

## 2018-09-20 MED ORDER — POLYETHYLENE GLYCOL 3350 17 G PO PACK: 17.0000 g | PACK | Freq: Every day | ORAL | Status: DC | PRN

## 2018-09-20 MED ORDER — METOCLOPRAMIDE HCL 5 MG/ML IJ SOLN
10.0000 mg | Freq: Once | INTRAMUSCULAR | Status: AC
  Administered 2018-09-20: 10 mg via INTRAVENOUS
  Filled 2018-09-20: qty 2

## 2018-09-20 MED ORDER — PROMETHAZINE HCL 25 MG PO TABS
12.5000 mg | ORAL_TABLET | Freq: Four times a day (QID) | ORAL | Status: DC | PRN
  Administered 2018-09-21 – 2018-09-23 (×6): 12.5 mg via ORAL
  Filled 2018-09-20 (×6): qty 1

## 2018-09-20 MED ORDER — METRONIDAZOLE IN NACL 5-0.79 MG/ML-% IV SOLN
500.0000 mg | Freq: Three times a day (TID) | INTRAVENOUS | Status: DC
  Administered 2018-09-20 – 2018-09-24 (×13): 500 mg via INTRAVENOUS
  Filled 2018-09-20 (×13): qty 100

## 2018-09-20 MED ORDER — SODIUM CHLORIDE 0.9 % IV BOLUS
500.0000 mL | Freq: Once | INTRAVENOUS | Status: AC
  Administered 2018-09-20: 500 mL via INTRAVENOUS

## 2018-09-20 MED ORDER — DIPHENHYDRAMINE HCL 50 MG/ML IJ SOLN
25.0000 mg | Freq: Once | INTRAMUSCULAR | Status: AC
  Administered 2018-09-20: 25 mg via INTRAVENOUS
  Filled 2018-09-20: qty 1

## 2018-09-20 MED ORDER — ACETAMINOPHEN 650 MG RE SUPP: 650.0000 mg | Freq: Four times a day (QID) | RECTAL | Status: DC | PRN

## 2018-09-20 MED ORDER — BOOST / RESOURCE BREEZE PO LIQD CUSTOM
1.0000 | Freq: Three times a day (TID) | ORAL | Status: DC
  Administered 2018-09-20 – 2018-09-24 (×4): 1 via ORAL
  Administered 2018-09-25: 20:00:00 via ORAL
  Administered 2018-09-25 – 2018-09-26 (×2): 1 via ORAL

## 2018-09-20 MED ORDER — ACETAMINOPHEN 325 MG PO TABS
650.0000 mg | ORAL_TABLET | Freq: Four times a day (QID) | ORAL | Status: DC | PRN
  Administered 2018-09-20 – 2018-09-21 (×2): 650 mg via ORAL
  Filled 2018-09-20 (×2): qty 2

## 2018-09-20 MED ORDER — SODIUM CHLORIDE 0.9 % IV SOLN
INTRAVENOUS | Status: DC
  Administered 2018-09-20 – 2018-09-24 (×4): via INTRAVENOUS

## 2018-09-20 MED ORDER — ONDANSETRON HCL 4 MG/2ML IJ SOLN
4.0000 mg | Freq: Once | INTRAMUSCULAR | Status: AC
  Administered 2018-09-20: 4 mg via INTRAVENOUS
  Filled 2018-09-20: qty 2

## 2018-09-20 MED ORDER — SODIUM CHLORIDE 0.9 % IV SOLN
2.0000 g | INTRAVENOUS | Status: DC
  Administered 2018-09-20 – 2018-09-23 (×4): 2 g via INTRAVENOUS
  Filled 2018-09-20: qty 20
  Filled 2018-09-20 (×4): qty 2

## 2018-09-20 MED ORDER — KETOROLAC TROMETHAMINE 30 MG/ML IJ SOLN
30.0000 mg | Freq: Four times a day (QID) | INTRAMUSCULAR | Status: AC | PRN
  Administered 2018-09-20 – 2018-09-24 (×9): 30 mg via INTRAVENOUS
  Filled 2018-09-20 (×10): qty 1

## 2018-09-20 MED ORDER — ENOXAPARIN SODIUM 40 MG/0.4ML ~~LOC~~ SOLN
40.0000 mg | SUBCUTANEOUS | Status: DC
  Administered 2018-09-20 – 2018-09-25 (×6): 40 mg via SUBCUTANEOUS
  Filled 2018-09-20 (×6): qty 0.4

## 2018-09-20 MED ORDER — ALBUTEROL SULFATE (2.5 MG/3ML) 0.083% IN NEBU: 2.5000 mg | INHALATION_SOLUTION | RESPIRATORY_TRACT | Status: DC | PRN

## 2018-09-20 NOTE — ED Notes (Signed)
Report called to receiving nurse

## 2018-09-20 NOTE — Plan of Care (Addendum)
Patient received from ED via stretcher; ambulated to bed in room from stretcher in hall. Complains of pain in abdomen. No additional concerns voiced at this time. Will continue to monitor. On admission history with interpreter she states she has residual weakness and numbness on left side that is from last May. She was hospitalized at that time, but does not elaborate further on condition. She does state she receives home PT and OT for this and her children help her at home.

## 2018-09-20 NOTE — ED Notes (Signed)
BACK FROM CT SCAN

## 2018-09-20 NOTE — ED Notes (Signed)
PT ATTEMPTING TO DRINK ORAL CONTAST

## 2018-09-20 NOTE — ED Triage Notes (Signed)
Pt reports L sided abdominal pain that, when palpated, refers to her R side. She endorses vomiting, diarrhea, and fever. Reports that symptoms started yesterday. Denies urinary symptoms. A&Ox4. Spanish speaking.

## 2018-09-20 NOTE — ED Notes (Signed)
EDPA MADE AWARE OF PT CURRENT STATUS OF PAIN AND NAUSEA AND PT UNABLE TO TOLERATE PO

## 2018-09-20 NOTE — ED Notes (Signed)
ADMISSION MD AT BEDSIDE 

## 2018-09-20 NOTE — H&P (Signed)
History and Physical  Patient Name: Lorraine [name]     MHD:[id number]    DOB: Mar 17, 1986    DOA: 09/20/2018 PCP: Patient, No Pcp Per  Patient coming from: Home  Chief Complaint: LLQ abdominal pain      HPI: Lorraine [name] is a 33 y.o. F with hx asthma, obesity, hx pancreatitis, diverticulitis and cholelithiasis s/p cholecystectomy now with chronic abdominal pain (including previous admissions for intractable nausea without diagnosis) who presents with vomiting and LLQ abdominal pain for 1 day.  All history obtained via videophonic interpreter.  The patient reports she was fine until 24 hours ago, she developed new colicky severe LLQ abdominal pain.  This was a new pain, she feels, and different from her chronic intermittent pain episodes.  This was not associated with diarrhea or hematochezia, but was associated with fever, vomiting and nausea, so she came to the ER  ED course: -Afebrile, heart rate 70, respirations and pulse ox normal, blood pressure 125/79 -Na 139, K 3.5, Cr 0.8, WBC 9.7K, Hgb 13.2 - Urinalysis without pyuria or hematuria - Lipase normal -Pregnancy test negative - Thick exam was obtained due to complaints of vaginal discharge and wet prep was done which showed clue cells and yeast -CT of the abdomen and pelvis without contrast was obtained that showed acute sigmoid diverticulitis -The patient repeatedly tried oral intake over several hours and was unable to tolerate anything by mouth, and Vomiting, and so the hospitalist group were asked to evaluate for diverticulitis     ROS: Review of Systems  Constitutional: Positive for fever and malaise/fatigue.  Gastrointestinal: Positive for abdominal pain, nausea and vomiting. Negative for blood in stool, constipation, diarrhea and melena.  All other systems reviewed and are negative.         Past Medical History:  Diagnosis Date  . Angio-edema   . Asthma    Inhaler used 01/02/16  . Diverticulitis   . Nausea  & vomiting 04/02/2017  . Pancreatitis   . Pre-diabetes   . Pyelonephritis   . Urticaria     Past Surgical History:  Procedure Laterality Date  . CESAREAN SECTION    . CESAREAN SECTION N/A 2006  . CESAREAN SECTION N/A 09/03/2014   Procedure: CESAREAN SECTION;  Surgeon: Guss Bunde, MD;  Location: [facility];  Service: Obstetrics;  Laterality: N/A;  . CHOLECYSTECTOMY    . ESOPHAGOGASTRODUODENOSCOPY (EGD) WITH PROPOFOL Left 04/07/2017   Procedure: ESOPHAGOGASTRODUODENOSCOPY (EGD) WITH PROPOFOL;  Surgeon: Ronnette Juniper, MD;  Location: [facility];  Service: Gastroenterology;  Laterality: Left;  . TUBAL LIGATION      Social History:  The patient walks unassisted.  Nonsmoker.  Allergies  Allergen Reactions  . Lupine Bean Extract Rash  . Morphine Rash  . Ivp Dye [Iodinated Diagnostic Agents] Hives and Itching    Family history: family history includes Asthma in her daughter; Diabetes in her father, maternal uncle, and paternal grandmother; Hyperlipidemia in her mother; Ulcers in her father.  Prior to Admission medications   Medication Sig Start Date End Date Taking? Authorizing Provider  metoCLOPramide (REGLAN) 10 MG tablet Take 1 tablet (10 mg total) by mouth every 6 (six) hours. Patient not taking: Reported on 09/20/2018 09/12/18   Joanne Gavel, PA-C       Physical Exam: BP 115/69 (BP Location: Left Arm)   Pulse 74   Temp 97.8 F (36.6 C) (Oral)   Resp 16   Ht 5' (1.524 m)   Wt 85.2 kg   LMP 09/15/2018 Comment:  neg hcg today  SpO2 100%   BMI 36.68 kg/m  General appearance: Well-developed, obese adult female, alert and in moderate distress from nausea.  Repeatedly asking for emesis bag while I am in room, no emesis. Eyes: Anicteric, conjunctiva pink, lids and lashes normal. PERRL.    ENT: No nasal deformity, discharge, epistaxis.  Hearing normal. OP moist without lesions.  Dentition normal, normal lips. Neck: No neck masses.  Trachea midline.  No  thyromegaly/tenderness. Lymph: No cervical or supraclavicular lymphadenopathy. Skin: Warm and dry.  No jaundice.  No suspicious rashes or lesions. Cardiac: RRR, nl S1-S2, no murmurs appreciated.  Capillary refill is brisk.  JVP not visible.  No LE edema.  Radial pulses 2+ and symmetric. Respiratory: Normal respiratory rate and rhythm.  CTAB without rales or wheezes. Abdomen: Abdomen soft.  Left sided TTP with voluntary guaridng. No ascites, distension, hepatosplenomegaly.   MSK: No deformities or effusions of the large joints of the upper or lower extremities bilaterally.  No cyanosis or clubbing. Neuro: Cranial nerves normal.  Sensation intact to light touch. Speech is fluent.  Muscle strength normal.    Psych: Sensorium intact and responding to questions, attention normal.  Behavior appropriate.  Affect tearful.  Judgment and insight appear normal.     Labs on Admission:  I have personally reviewed following labs and imaging studies: CBC: Recent Labs  Lab 09/18/18 2002 09/20/18 0511  WBC 7.9 9.7  HGB 13.2 13.2  HCT 40.9 41.0  MCV 89.9 89.9  PLT 298 967   Basic Metabolic Panel: Recent Labs  Lab 09/18/18 2002 09/20/18 0511  NA 142 139  K 3.7 3.5  CL 108 104  CO2 26 24  GLUCOSE 105* 108*  BUN 15 17  CREATININE 0.91 0.84  CALCIUM 9.2 9.3   GFR: Estimated Creatinine Clearance: 93.2 mL/min (by C-G formula based on SCr of 0.84 mg/dL).  Liver Function Tests: Recent Labs  Lab 09/20/18 0511  AST 28  ALT 36  ALKPHOS 95  BILITOT 0.3  PROT 7.4  ALBUMIN 4.3   Recent Labs  Lab 09/20/18 0511  LIPASE 39     Recent Results (from the past 240 hour(s))  Urine Culture     Status: Abnormal   Collection Time: 09/12/18 11:53 AM  Result Value Ref Range Status   Specimen Description URINE, RANDOM  Final   Special Requests   Final    Normal Performed at [facility], [place] [address] [place], [place] [address]    Culture (A)  Final    >=100,000 COLONIES/mL MULTIPLE  SPECIES PRESENT, SUGGEST RECOLLECTION   Report Status 09/13/2018 FINAL  Final  Wet prep, genital     Status: Abnormal   Collection Time: 09/20/18  6:34 AM  Result Value Ref Range Status   Yeast Wet Prep HPF POC PRESENT (A) NONE SEEN Final   Trich, Wet Prep NONE SEEN NONE SEEN Final   Clue Cells Wet Prep HPF POC PRESENT (A) NONE SEEN Final   WBC, Wet Prep HPF POC MANY (A) NONE SEEN Final   Sperm NONE SEEN  Final    Comment: Performed at [facility], [place] [address] [place],  [address]           Radiological Exams on Admission: Personally reviewed CT abdomen report: Ct Abdomen Pelvis Wo Contrast  Result Date: 09/20/2018 CLINICAL DATA:  Left-sided abdominal pain with vomiting, diarrhea and fever. Symptoms began yesterday. EXAM: CT ABDOMEN AND PELVIS WITHOUT CONTRAST TECHNIQUE: Multidetector CT imaging  of the abdomen and pelvis was performed following the standard protocol without IV contrast. COMPARISON:  09/12/2018 and 05/31/2018 FINDINGS: Lower chest: Subtle dependent bibasilar atelectasis. Hepatobiliary: Previous cholecystectomy. Liver and biliary tree are normal. Pancreas: Normal. Spleen: Normal. Adrenals/Urinary Tract: Adrenal glands are normal. Kidneys are normal in size without hydronephrosis. Punctate nonobstructing stone over the upper pole left kidney. Ureters and bladder are normal. Stomach/Bowel: Stomach and small bowel are normal. Appendix is normal. Mild diverticulosis of the colon. There is an inflamed diverticula over the distal descending colon in the left lower quadrant. There is no perforation or diverticular abscess. Vascular/Lymphatic: Normal. Reproductive: Uterus and ovaries are unremarkable. Evidence of previous bilateral tubal ligation. Other: None. Musculoskeletal: Normal. IMPRESSION: Evidence of acute diverticulitis involving the distal descending colon in the left lower quadrant. No perforation or abscess. Nonobstructing punctate stone over the  upper pole left kidney. Electronically Signed   By: Marin Olp M.D.   On: 09/20/2018 13:18           Assessment/Plan Principal Problem:   Acute diverticulitis Active Problems:   Asthma   Intractable nausea and vomiting   Obesity (BMI 30.0-34.9)  Acute uncomplicated diverticulitis  Presents with fever, LLQ pain, CT showing acute diverticulitis.  Appropriate for outpatient treatment but continually vomiting, unable to take PO.   -IV ceftriaxone and Flagyl -Clears, bowel rest -IVF -Ketorolac for pain, avoid opiates    Bacterial vaginosis  Yeast infection Vaginal discharge and wet prep with both clue cells and yeast. -Monistat at discharge, already on systemic Flagyl  Asthma, mild intermittnet  Not active -Albuterol PRN       DVT prophylaxis: Lovenox  Code Status: FULL  Family Communication: None present  Disposition Plan: Anticipate IV Fluids, bowel rest, anti-emetics, and ceftriaxone/Flagyl.  If able to tolerate PO tomorrow, plan for discharge with continued oral abx as outpatient. Consults called: None Admission status: OBS   At the point of initial evaluation, it is my clinical opinion that admission for OBSERVATION is reasonable and necessary because the patient's presenting complaints in the context of their chronic conditions represent sufficient risk of deterioration or significant morbidity to constitute reasonable grounds for close observation in the hospital setting, but that the patient may be medically stable for discharge from the hospital within 24 to 48 hours.    Medical decision making: Patient seen at 4:22 PM on 09/20/2018.  The patient was discussed with Linus Galas, PA-C .  What exists of the patient's chart was reviewed in depth and summarized above.  Clinical condition: stable.        [place] [facility] Pager: please page via [email or web address], enter password "[email or web address]", and identify attending under [facility]

## 2018-09-20 NOTE — ED Provider Notes (Signed)
[facility] DEPT Provider Note   CSN: [id number] Arrival date & time: 09/20/18  0003     History   Chief Complaint Chief Complaint  Patient presents with  . Abdominal Pain    HPI Lorraine [name] is a 33 y.o. female with a hx of pancreatitis, pyelonephritis, diverticulitis, & who is s/p cholecystectomy, multiple c-sections, and tubal ligation who presents to the ED with complaints of abdominal pain that started yesterday. Patient states pain is located in the LLQ, non- radiating, sharp in nature. Currently an 8/10 in severity without associated alleviating/aggravating factors. No intervention PTA. Associated nausea, vomiting, & diarrhea. No blood in emesis or diarrhea. Has noted some vaginal discharge. Sexually active w/ 1 partner without concern for STDs. Denies fever, chills, dysuria, vaginal bleeding, chest pain, or dyspnea.   Hx of chronic abdominal pain, has not followed up with GI secondary to insurance issues.   HPI  Past Medical History:  Diagnosis Date  . Angio-edema   . Asthma    Inhaler used 01/02/16  . Diverticulitis   . Nausea & vomiting 04/02/2017  . Pancreatitis   . Pre-diabetes   . Pyelonephritis   . Urticaria     Patient Active Problem List   Diagnosis Date Noted  . Cervical radiculopathy   . Left arm weakness   . Left-sided weakness 02/19/2018  . Weakness 02/19/2018  . Rash 02/09/2018  . Chest pain 02/08/2018  . Acute pancreatitis 04/02/2017  . Abdominal pain, acute, left upper quadrant 04/02/2017  . Intractable nausea and vomiting 04/02/2017  . Asthma in adult 04/02/2017  . Obesity (BMI 30.0-34.9) 04/02/2017  . Fatty infiltration of liver 04/02/2017  . Gastroesophageal reflux disease without esophagitis   . Pre-diabetes 07/15/2015  . Diarrhea   . Hypokalemia   . Bacterial vaginosis   . Pyelonephritis 07/07/2015  . Abdominal pain, right upper quadrant   . Asthma 10/31/2014  . History of preterm delivery  10/22/2014  . Status post repeat low transverse cesarean section 09/06/2014  . Diverticulitis 09/10/2012    Past Surgical History:  Procedure Laterality Date  . CESAREAN SECTION    . CESAREAN SECTION N/A 2006  . CESAREAN SECTION N/A 09/03/2014   Procedure: CESAREAN SECTION;  Surgeon: Guss Bunde, MD;  Location: [facility];  Service: Obstetrics;  Laterality: N/A;  . CHOLECYSTECTOMY    . ESOPHAGOGASTRODUODENOSCOPY (EGD) WITH PROPOFOL Left 04/07/2017   Procedure: ESOPHAGOGASTRODUODENOSCOPY (EGD) WITH PROPOFOL;  Surgeon: Ronnette Juniper, MD;  Location: [facility];  Service: Gastroenterology;  Laterality: Left;  . TUBAL LIGATION       OB History    Gravida  5   Para  3   Term  1   Preterm  2   AB  2   Living  3     SAB  2   TAB      Ectopic      Multiple  1   Live Births  3            Home Medications    Prior to Admission medications   Medication Sig Start Date End Date Taking? Authorizing Provider  metoCLOPramide (REGLAN) 10 MG tablet Take 1 tablet (10 mg total) by mouth every 6 (six) hours. Patient not taking: Reported on 09/20/2018 09/12/18   Joanne Gavel, PA-C    Family History Family History  Problem Relation Age of Onset  . Hyperlipidemia Mother   . Diabetes Father   . Diabetes Maternal Uncle   . Diabetes Paternal  Grandmother   . Asthma Daughter   . Allergic rhinitis Neg Hx   . Angioedema Neg Hx   . Eczema Neg Hx   . Immunodeficiency Neg Hx   . Urticaria Neg Hx     Social History Social History   Tobacco Use  . Smoking status: Never Smoker  . Smokeless tobacco: Never Used  Substance Use Topics  . Alcohol use: No  . Drug use: No     Allergies   Lupine bean extract; Morphine; and Ivp dye [iodinated diagnostic agents]   Review of Systems Review of Systems  Constitutional: Negative for chills and fever.  Respiratory: Negative for shortness of breath.   Cardiovascular: Negative for chest pain.  Gastrointestinal: Positive for  abdominal pain, diarrhea, nausea and vomiting.  Genitourinary: Positive for vaginal discharge. Negative for dysuria and vaginal bleeding.  All other systems reviewed and are negative.    Physical Exam Updated Vital Signs BP 126/62   Pulse 78   Temp 98.5 F (36.9 C) (Oral)   Resp 18   LMP 09/15/2018   SpO2 100%   Physical Exam Vitals signs and nursing note reviewed. Exam conducted with a chaperone present.  Constitutional:      General: She is not in acute distress.    Appearance: She is well-developed. She is not toxic-appearing.  HENT:     Head: Normocephalic and atraumatic.  Eyes:     General:        Right eye: No discharge.        Left eye: No discharge.     Conjunctiva/sclera: Conjunctivae normal.  Neck:     Musculoskeletal: Neck supple.  Cardiovascular:     Rate and Rhythm: Normal rate and regular rhythm.  Pulmonary:     Effort: Pulmonary effort is normal. No respiratory distress.     Breath sounds: Normal breath sounds. No wheezing, rhonchi or rales.  Abdominal:     General: There is no distension.     Palpations: Abdomen is soft.     Tenderness: There is abdominal tenderness in the left lower quadrant. There is no guarding or rebound.  Genitourinary:    Exam position: Supine.     Labia:        Right: No lesion.        Left: No lesion.      Vagina: Vaginal discharge (white, mild amount) present.     Cervix: No cervical motion tenderness.     Adnexa:        Right: No mass or tenderness.         Left: No mass or tenderness.       Comments: RN [place] present as chaperone.  Skin:    General: Skin is warm and dry.     Findings: No rash.  Neurological:     Mental Status: She is alert.     Comments: Clear speech.   Psychiatric:        Behavior: Behavior normal.      ED Treatments / Results  Labs (all labs ordered are listed, but only abnormal results are displayed) Labs Reviewed  COMPREHENSIVE METABOLIC PANEL - Abnormal; Notable for the following  components:      Result Value   Glucose, Bld 108 (*)    All other components within normal limits  LIPASE, BLOOD  CBC  URINALYSIS, ROUTINE W REFLEX MICROSCOPIC  I-STAT BETA HCG BLOOD, ED ([facility], [facility], [facility] ONLY)    EKG None  Radiology Ct Abdomen Pelvis Wo Contrast  Result Date: 09/20/2018 CLINICAL DATA:  Left-sided abdominal pain with vomiting, diarrhea and fever. Symptoms began yesterday. EXAM: CT ABDOMEN AND PELVIS WITHOUT CONTRAST TECHNIQUE: Multidetector CT imaging of the abdomen and pelvis was performed following the standard protocol without IV contrast. COMPARISON:  09/12/2018 and 05/31/2018 FINDINGS: Lower chest: Subtle dependent bibasilar atelectasis. Hepatobiliary: Previous cholecystectomy. Liver and biliary tree are normal. Pancreas: Normal. Spleen: Normal. Adrenals/Urinary Tract: Adrenal glands are normal. Kidneys are normal in size without hydronephrosis. Punctate nonobstructing stone over the upper pole left kidney. Ureters and bladder are normal. Stomach/Bowel: Stomach and small bowel are normal. Appendix is normal. Mild diverticulosis of the colon. There is an inflamed diverticula over the distal descending colon in the left lower quadrant. There is no perforation or diverticular abscess. Vascular/Lymphatic: Normal. Reproductive: Uterus and ovaries are unremarkable. Evidence of previous bilateral tubal ligation. Other: None. Musculoskeletal: Normal. IMPRESSION: Evidence of acute diverticulitis involving the distal descending colon in the left lower quadrant. No perforation or abscess. Nonobstructing punctate stone over the upper pole left kidney. Electronically Signed   By: Marin Olp M.D.   On: 09/20/2018 13:18    Procedures Procedures (including critical care time)  Medications Ordered in ED Medications - No data to display   Initial Impression / Assessment and Plan / ED Course  I have reviewed the triage vital signs and the nursing notes.  Pertinent labs & imaging results  that were available during my care of the patient were reviewed by me and considered in my medical decision making (see chart for details).   Patient presents to the ED with abdominal pain. Patient with hx of chronic abdominal pain, multiple ER visits for same in the past 6 months w/ several re-assuring CT scans- most recently within the past 1.5 weeks. Has been referred to GI but has not been able to f/u secondary to insurance issues. Presenting with LLQ over past 24 hours. Nontoxic appearing, in no apparent distress, vitals WNL. Exam with LLQ tenderness to palpation without peritoneal signs. Her lab work is reassuring and benign. No leukocytosis. No anemia. No electrolyte disturbance. Lipase, LFTs, and renal function WNL. Patient is not pregnant, doubt ectopic. UA without infection. Pelvic exam without tenderness to raise concern for PID, additionally patient without concern for STD. Wet prep w/ yeast as well as BV. Low suspicion for torsion. Due to location of pain and diarrhea w/ hx of diverticulosis & prior diverticulitis, without peritoneal signs and re-assuring labs, plan initially for pain control and tx of presumed diverticulitis, however patient still very uncomfortable following 1 mg of Dilaudid x 2 and unable to attempt PO secondary to nausea after zofran, she has had re-occurrence of vomiting. Discussed risks/benefits of imaging with the patient- will proceed with CT scan. Reglan & benadryl ordered.   CT scan shows evidence of acute diverticulitis involving the distal descending colon in the left lower quadrant. No perforation or abscess.  14:00: RE-EVAL: Patient appears more uncomfortable on re-assessment than she did upon arrival. She is somewhat tearful. Remains tender in LLQ. She has had additional vomiting. At this time given medications provided in the ER with persistent pain and vomiting feel patient warrants admission for pain control and intractable vomiting. Discussed with supervising  physician Dr. Maryan Rued who is in agreement. Patient agreeable.   14:20: CONSULT: Discussed case with hospitalist Dr. Loleta Books who accepts admission.    Final Clinical Impressions(s) / ED Diagnoses   Final diagnoses:  Diverticulitis  Intractable vomiting with nausea, unspecified vomiting type  ED Discharge Orders    None       Leafy Kindle 09/20/18 1508    Blanchie Dessert, MD 09/20/18 1525

## 2018-09-20 NOTE — ED Notes (Signed)
Bed: WA25 Expected date:  Expected time:  Means of arrival:  Comments: 

## 2018-09-20 NOTE — ED Notes (Signed)
Delay in triage d/t waiting on translator.

## 2018-09-20 NOTE — ED Notes (Signed)
EDPA Provider at bedside. 

## 2018-09-20 NOTE — Progress Notes (Signed)
ED TO INPATIENT HANDOFF REPORT  Name/Age/Gender Lorraine [name] 33 y.o. female  Code Status    Code Status Orders  (From admission, onward)         Start     Ordered   09/20/18 1446  Full code  Continuous     09/20/18 1446        Code Status History    Date Active Date Inactive Code Status Order ID Comments User Context   02/19/2018 2026 02/25/2018 2101 Full Code [id number]  Vita Erm, NP Inpatient   02/09/2018 2031 02/10/2018 2030 Full Code [id number]  Jani Gravel, MD Inpatient   02/08/2018 0309 02/08/2018 1833 Full Code [id number]  Bethena Roys, MD Inpatient   04/02/2017 0808 04/09/2017 1555 Full Code [id number]  Samella Parr, NP Inpatient   07/07/2015 2351 07/11/2015 1840 Full Code [id number]  Riccardo Dubin, MD ED   10/31/2014 0735 11/01/2014 2114 Full Code [id number]  Theressa Millard, MD Inpatient   09/03/2014 1727 09/06/2014 1555 Full Code [id number]  Guss Bunde, MD Inpatient   09/03/2014 0505 09/03/2014 1727 Full Code [id number]  Christin Fudge, [place] Inpatient   08/25/2014 0137 08/31/2014 1850 Full Code [id number]  Lavon Paganini, MD Inpatient   08/06/2014 1004 08/08/2014 1406 Full Code [id number]  Myrtis Ser, CNM Inpatient   08/02/2014 2022 08/05/2014 1728 Full Code [id number]  Josephine Cables, MD Inpatient   08/02/2014 1824 08/02/2014 2022 Full Code [id number]  Josephine Cables, MD Inpatient   07/12/2014 1617 07/15/2014 2130 Full Code [id number]  Shelbie Hutching, MD Inpatient   09/10/2012 0043 09/12/2012 2005 Full Code [id number]  Nancy Nordmann, RN ED      Home/SNF/Other Home  Chief Complaint Stomach Pain/Fever  Level of Care/Admitting Diagnosis ED Disposition    ED Disposition Condition [facility]: [facility] [[id number]]  Level of Care: Med-Surg [16]  Diagnosis: Acute diverticulitis [5035465]  Admitting Physician: Edwin Dada [[id number]]  Attending Physician:  Edwin Dada [[id number]]  PT Class (Do Not Modify): Observation [104]  PT Acc Code (Do Not Modify): Observation [10022]       Medical History Past Medical History:  Diagnosis Date  . Angio-edema   . Asthma    Inhaler used 01/02/16  . Diverticulitis   . Nausea & vomiting 04/02/2017  . Pancreatitis   . Pre-diabetes   . Pyelonephritis   . Urticaria     Allergies Allergies  Allergen Reactions  . Lupine Bean Extract Rash  . Morphine Rash  . Ivp Dye [Iodinated Diagnostic Agents] Hives and Itching    IV Location/Drains/Wounds Patient Lines/Drains/Airways Status   Active Line/Drains/Airways    Name:   Placement date:   Placement time:   Site:   Days:   Peripheral IV 09/20/18 Right;Anterior Antecubital   09/20/18    0800    Antecubital   less than 1          Labs/Imaging Results for orders placed or performed during the hospital encounter of 09/20/18 (from the past 48 hour(s))  Lipase, blood     Status: None   Collection Time: 09/20/18  5:11 AM  Result Value Ref Range   Lipase 39 11 - 51 U/L    Comment: Performed at [facility], [place] [address] [place], [place] [address]  Comprehensive metabolic panel     Status: Abnormal   Collection Time: 09/20/18  5:11 AM  Result Value Ref Range   Sodium 139 135 - 145  mmol/L   Potassium 3.5 3.5 - 5.1 mmol/L   Chloride 104 98 - 111 mmol/L   CO2 24 22 - 32 mmol/L   Glucose, Bld 108 (H) 70 - 99 mg/dL   BUN 17 6 - 20 mg/dL   Creatinine, Ser 0.84 0.44 - 1.00 mg/dL   Calcium 9.3 8.9 - 10.3 mg/dL   Total Protein 7.4 6.5 - 8.1 g/dL   Albumin 4.3 3.5 - 5.0 g/dL   AST 28 15 - 41 U/L   ALT 36 0 - 44 U/L   Alkaline Phosphatase 95 38 - 126 U/L   Total Bilirubin 0.3 0.3 - 1.2 mg/dL   GFR calc non Af Amer >60 >60 mL/min   GFR calc Af Amer >60 >60 mL/min   Anion gap 11 5 - 15    Comment: Performed at [facility], [place] [address], [place], [place] [address]  CBC     Status: None   Collection  Time: 09/20/18  5:11 AM  Result Value Ref Range   WBC 9.7 4.0 - 10.5 K/uL   RBC 4.56 3.87 - 5.11 MIL/uL   Hemoglobin 13.2 12.0 - 15.0 g/dL   HCT 41.0 36.0 - 46.0 %   MCV 89.9 80.0 - 100.0 fL   MCH 28.9 26.0 - 34.0 pg   MCHC 32.2 30.0 - 36.0 g/dL   RDW 12.3 11.5 - 15.5 %   Platelets 272 150 - 400 K/uL   nRBC 0.0 0.0 - 0.2 %    Comment: Performed at [facility], [place] [address], [place], [place] [address]  Urinalysis, Routine w reflex microscopic     Status: None   Collection Time: 09/20/18  5:11 AM  Result Value Ref Range   Color, Urine YELLOW YELLOW   APPearance CLEAR CLEAR   Specific Gravity, Urine 1.020 1.005 - 1.030   pH 6.0 5.0 - 8.0   Glucose, UA NEGATIVE NEGATIVE mg/dL   Hgb urine dipstick NEGATIVE NEGATIVE   Bilirubin Urine NEGATIVE NEGATIVE   Ketones, ur NEGATIVE NEGATIVE mg/dL   Protein, ur NEGATIVE NEGATIVE mg/dL   Nitrite NEGATIVE NEGATIVE   Leukocytes, UA NEGATIVE NEGATIVE    Comment: Microscopic not done on urines with negative protein, blood, leukocytes, nitrite, or glucose < 500 mg/dL. Performed at [facility], [place] [address], [place],  [address]   I-Stat beta hCG blood, ED     Status: None   Collection Time: 09/20/18  5:20 AM  Result Value Ref Range   I-stat hCG, quantitative <5.0 <5 mIU/mL   Comment 3            Comment:   GEST. AGE      CONC.  (mIU/mL)   <=1 WEEK        5 - 50     2 WEEKS       50 - 500     3 WEEKS       100 - 10,000     4 WEEKS     1,000 - 30,000        FEMALE AND NON-PREGNANT FEMALE:     LESS THAN 5 mIU/mL   Wet prep, genital     Status: Abnormal   Collection Time: 09/20/18  6:34 AM  Result Value Ref Range   Yeast Wet Prep HPF POC PRESENT (A) NONE SEEN   Trich, Wet Prep NONE SEEN NONE SEEN   Clue Cells Wet Prep HPF POC PRESENT (A) NONE SEEN  WBC, Wet Prep HPF POC MANY (A) NONE SEEN   Sperm NONE SEEN     Comment: Performed at [facility], [place] [address]  [place], [place] [address]   Ct Abdomen Pelvis Wo Contrast  Result Date: 09/20/2018 CLINICAL DATA:  Left-sided abdominal pain with vomiting, diarrhea and fever. Symptoms began yesterday. EXAM: CT ABDOMEN AND PELVIS WITHOUT CONTRAST TECHNIQUE: Multidetector CT imaging of the abdomen and pelvis was performed following the standard protocol without IV contrast. COMPARISON:  09/12/2018 and 05/31/2018 FINDINGS: Lower chest: Subtle dependent bibasilar atelectasis. Hepatobiliary: Previous cholecystectomy. Liver and biliary tree are normal. Pancreas: Normal. Spleen: Normal. Adrenals/Urinary Tract: Adrenal glands are normal. Kidneys are normal in size without hydronephrosis. Punctate nonobstructing stone over the upper pole left kidney. Ureters and bladder are normal. Stomach/Bowel: Stomach and small bowel are normal. Appendix is normal. Mild diverticulosis of the colon. There is an inflamed diverticula over the distal descending colon in the left lower quadrant. There is no perforation or diverticular abscess. Vascular/Lymphatic: Normal. Reproductive: Uterus and ovaries are unremarkable. Evidence of previous bilateral tubal ligation. Other: None. Musculoskeletal: Normal. IMPRESSION: Evidence of acute diverticulitis involving the distal descending colon in the left lower quadrant. No perforation or abscess. Nonobstructing punctate stone over the upper pole left kidney. Electronically Signed   By: Marin Olp M.D.   On: 09/20/2018 13:18    Pending Labs Unresulted Labs (From admission, onward)    Start     Ordered   09/27/18 0500  Creatinine, serum  (enoxaparin (LOVENOX)    CrCl >/= 30 ml/min)  Weekly,   R    Comments:  while on enoxaparin therapy    09/20/18 1446   09/21/18 5176  Basic metabolic panel  Tomorrow morning,   R     09/20/18 1446   09/21/18 0500  CBC  Tomorrow morning,   R     09/20/18 1446          Vitals/Pain Today's Vitals   09/20/18 0843 09/20/18 1030 09/20/18 1130 09/20/18 1229  BP:   107/81 (!) 109/98   Pulse:  84 (!) 108   Resp:  16 14   Temp:      TempSrc:      SpO2:  99% 98%   Weight:      Height:      PainSc: 5    5     Isolation Precautions No active isolations  Medications Medications  sodium chloride 0.9 % bolus 1,000 mL (1,000 mLs Intravenous New Bag/Given (Non-Interop) 09/20/18 1427)  cefTRIAXone (ROCEPHIN) 2 g in sodium chloride 0.9 % 100 mL IVPB (has no administration in time range)  metroNIDAZOLE (FLAGYL) IVPB 500 mg (has no administration in time range)  enoxaparin (LOVENOX) injection 40 mg (has no administration in time range)  0.9 %  sodium chloride infusion (has no administration in time range)  acetaminophen (TYLENOL) tablet 650 mg (has no administration in time range)    Or  acetaminophen (TYLENOL) suppository 650 mg (has no administration in time range)  ketorolac (TORADOL) 30 MG/ML injection 30 mg (has no administration in time range)  polyethylene glycol (MIRALAX / GLYCOLAX) packet 17 g (has no administration in time range)  promethazine (PHENERGAN) tablet 12.5 mg (has no administration in time range)  albuterol (PROVENTIL) (2.5 MG/3ML) 0.083% nebulizer solution 2.5 mg (has no administration in time range)  HYDROmorphone (DILAUDID) injection 1 mg (1 mg Intravenous Given 09/20/18 0835)  ondansetron (ZOFRAN) injection 4 mg (4 mg Intravenous Given 09/20/18 0835)  sodium  chloride 0.9 % bolus 500 mL (0 mLs Intravenous Stopped 09/20/18 0936)  HYDROmorphone (DILAUDID) injection 1 mg (1 mg Intravenous Given 09/20/18 1030)  famotidine (PEPCID) IVPB 20 mg premix (0 mg Intravenous Stopped 09/20/18 1212)  metoCLOPramide (REGLAN) injection 10 mg (10 mg Intravenous Given 09/20/18 1142)  diphenhydrAMINE (BENADRYL) injection 25 mg (25 mg Intravenous Given 09/20/18 1142)    Mobility walks

## 2018-09-20 NOTE — ED Notes (Signed)
WATER GIVEN 

## 2018-09-20 NOTE — ED Notes (Signed)
ATTEMPTED TWICE FOR IV. NOT SUCCESSFUL. IV CONSULT PLACED

## 2018-09-21 LAB — BASIC METABOLIC PANEL
Anion gap: 9 (ref 5–15)
BUN: 8 mg/dL (ref 6–20)
CO2: 23 mmol/L (ref 22–32)
Calcium: 8.2 mg/dL — ABNORMAL LOW (ref 8.9–10.3)
Chloride: 107 mmol/L (ref 98–111)
Creatinine, Ser: 0.66 mg/dL (ref 0.44–1.00)
GFR calc Af Amer: >60 mL/min (ref >60)
GFR calc non Af Amer: >60 mL/min (ref >60)
Glucose, Bld: 91 mg/dL (ref 70–99)
Potassium: 3.4 mmol/L — ABNORMAL LOW (ref 3.5–5.1)
Sodium: 139 mmol/L (ref 135–145)

## 2018-09-21 LAB — CBC
HCT: 37.7 % (ref 36.0–46.0)
Hemoglobin: 12.2 g/dL (ref 12.0–15.0)
MCH: 29.6 pg (ref 26.0–34.0)
MCHC: 32.4 g/dL (ref 30.0–36.0)
MCV: 91.5 fL (ref 80.0–100.0)
Platelets: 244 10*3/uL (ref 150–400)
RBC: 4.12 MIL/uL (ref 3.87–5.11)
RDW: 12.4 % (ref 11.5–15.5)
WBC: 5.6 10*3/uL (ref 4.0–10.5)
nRBC: 0 % (ref 0.0–0.2)

## 2018-09-21 LAB — TROPONIN I: Troponin I: <0.03 ng/mL (ref <0.03)

## 2018-09-21 MED ORDER — POTASSIUM CHLORIDE 10 MEQ/100ML IV SOLN
10.0000 meq | INTRAVENOUS | Status: AC
  Administered 2018-09-21 – 2018-09-22 (×2): 10 meq via INTRAVENOUS
  Filled 2018-09-21 (×2): qty 100

## 2018-09-21 MED ORDER — FAMOTIDINE IN NACL 20-0.9 MG/50ML-% IV SOLN
20.0000 mg | Freq: Two times a day (BID) | INTRAVENOUS | Status: DC
  Administered 2018-09-21 – 2018-09-24 (×7): 20 mg via INTRAVENOUS
  Filled 2018-09-21 (×8): qty 50

## 2018-09-21 MED ORDER — HYDROMORPHONE HCL 1 MG/ML IJ SOLN
0.5000 mg | INTRAMUSCULAR | Status: DC | PRN
  Administered 2018-09-21 – 2018-09-23 (×7): 0.5 mg via INTRAVENOUS
  Filled 2018-09-21 (×7): qty 0.5

## 2018-09-21 MED ORDER — ONDANSETRON HCL 4 MG/2ML IJ SOLN
4.0000 mg | Freq: Four times a day (QID) | INTRAMUSCULAR | Status: DC | PRN
  Administered 2018-09-21 – 2018-09-25 (×11): 4 mg via INTRAVENOUS
  Filled 2018-09-21 (×11): qty 2

## 2018-09-21 MED ORDER — ONDANSETRON HCL 4 MG/2ML IJ SOLN: 4.0000 mg | Freq: Four times a day (QID) | INTRAMUSCULAR | Status: DC | PRN

## 2018-09-21 MED ORDER — FLUCONAZOLE 100MG IVPB
100.0000 mg | INTRAVENOUS | Status: DC
  Administered 2018-09-21 – 2018-09-24 (×4): 100 mg via INTRAVENOUS
  Filled 2018-09-21 (×4): qty 50

## 2018-09-21 MED ORDER — TRAMADOL HCL 50 MG PO TABS
50.0000 mg | ORAL_TABLET | Freq: Once | ORAL | Status: AC
  Administered 2018-09-21: 50 mg via ORAL
  Filled 2018-09-21: qty 1

## 2018-09-21 NOTE — Progress Notes (Signed)
PROGRESS NOTE    Lorraine [name]  CVE:[id number] DOB: 05-19-1986 DOA: 09/20/2018 PCP: Patient, No Pcp Per    Brief Narrative: 33 year old with past medical history significant for asthma, obesity, history of complete pancreatitis, diverticulitis and cholelithiasis status post cholecystectomy now with chronic abdominal pain who presents with vomiting, nausea and left lower quadrant abdominal pain that is started to 3 days prior to admission.  Patient in the ED CT abdomen was consistent with acute sigmoid diverticulitis   Assessment & Plan:   Principal Problem:   Acute diverticulitis Active Problems:   Asthma   Intractable nausea and vomiting   Obesity (BMI 30.0-34.9)  1-Acute uncomplicated sigmoid diverticulitis: Patient with abdominal pain, nausea vomiting. CT abdomen consistent with acute sigmoid diverticulitis. Patient continued to have nausea vomiting.  Continue with bowel rest.  Continue with IV ceftriaxone and Flagyl. We will add a low-dose, dilaudid.  We will continue with Toradol, will be have to be cautious patient is not been vomiting and has been complaining of chest pain suspect some esophagitis.  I will start IV Pepcid.   2-Bacterial vaginosis, yeast infection. Start IV fluconazole, already on IV Flagyl.  3-Chest pain; She reports chest pressure.  I suspect this is related to frequent vomiting. Will start IV Pepcid.  Continue with her albuterol in case that chest pain was related to asthma.  4-Asthma;  Continue with as needed albuterol. Patient is aware there is available for her.  5-Hypokalemia; Replete IV.  Estimated body mass index is 36.68 kg/m as calculated from the following:   Height as of this encounter: 5' (1.524 m).   Weight as of this encounter: 85.2 kg.   DVT prophylaxis: SCDs, Lovenox Code Status: Full code Family Communication: Care discussed with patient Disposition Plan: Patient with persistent nausea and vomiting, still with abdominal  pain.  We will continue with n.p.o. status.  IV fluids.  IV antibiotics.   Consultants:   none   Procedures:   none  Antimicrobials:  Ceftriaxone  Flagyl;   Subjective: Patient seen and evaluated.  She reports left lower quadrant abdominal pain 5 out of 10.  She report pain yesterday was 8 out of 10.  She reports an episode of vomiting this morning.   Objective: Vitals:   09/20/18 1521 09/20/18 2122 09/21/18 0145 09/21/18 0543  BP: 115/69 130/74 113/70 102/79  Pulse: 74 67 67 73  Resp: 16 15 16 16   Temp: 97.8 F (36.6 C) 98.2 F (36.8 C) 98.2 F (36.8 C) 98.5 F (36.9 C)  TempSrc: Oral Oral Oral Oral  SpO2: 100% 97% 100% 100%  Weight:      Height:        Intake/Output Summary (Last 24 hours) at 09/21/2018 0836 Last data filed at 09/21/2018 0606 Gross per 24 hour  Intake 3418.07 ml  Output 400 ml  Net 3018.07 ml   Filed Weights   09/20/18 0829  Weight: 85.2 kg    Examination:  General exam: Appears calm and comfortable  Respiratory system: Clear to auscultation. Respiratory effort normal. Cardiovascular system: S1 & S2 heard, RRR. No JVD, murmurs, rubs, gallops or clicks. No pedal edema. Gastrointestinal system: Abdomen is mildly distended, mild left lower quadrant tenderness, no rigidity, no guarding. Central nervous system: Alert and oriented. No focal neurological deficits. Extremities: Symmetric 5 x 5 power. Skin: No rashes, lesions or ulcers Psychiatry: Judgement and insight appear normal. Mood & affect appropriate.     Data Reviewed: I have personally reviewed following labs and imaging studies  CBC: Recent Labs  Lab 09/18/18 2002 09/20/18 0511 09/21/18 0252  WBC 7.9 9.7 5.6  HGB 13.2 13.2 12.2  HCT 40.9 41.0 37.7  MCV 89.9 89.9 91.5  PLT 298 272 096   Basic Metabolic Panel: Recent Labs  Lab 09/18/18 2002 09/20/18 0511 09/21/18 0252  NA 142 139 139  K 3.7 3.5 3.4*  CL 108 104 107  CO2 26 24 23   GLUCOSE 105* 108* 91  BUN 15 17 8     CREATININE 0.91 0.84 0.66  CALCIUM 9.2 9.3 8.2*   GFR: Estimated Creatinine Clearance: 97.9 mL/min (by C-G formula based on SCr of 0.66 mg/dL). Liver Function Tests: Recent Labs  Lab 09/20/18 0511  AST 28  ALT 36  ALKPHOS 95  BILITOT 0.3  PROT 7.4  ALBUMIN 4.3   Recent Labs  Lab 09/20/18 0511  LIPASE 39   No results for input(s): AMMONIA in the last 168 hours. Coagulation Profile: No results for input(s): INR, PROTIME in the last 168 hours. Cardiac Enzymes: Recent Labs  Lab 09/21/18 0252  TROPONINI <0.03   BNP (last 3 results) No results for input(s): PROBNP in the last 8760 hours. HbA1C: No results for input(s): HGBA1C in the last 72 hours. CBG: No results for input(s): GLUCAP in the last 168 hours. Lipid Profile: No results for input(s): CHOL, HDL, LDLCALC, TRIG, CHOLHDL, LDLDIRECT in the last 72 hours. Thyroid Function Tests: No results for input(s): TSH, T4TOTAL, FREET4, T3FREE, THYROIDAB in the last 72 hours. Anemia Panel: No results for input(s): VITAMINB12, FOLATE, FERRITIN, TIBC, IRON, RETICCTPCT in the last 72 hours. Sepsis Labs: No results for input(s): PROCALCITON, LATICACIDVEN in the last 168 hours.  Recent Results (from the past 240 hour(s))  Urine Culture     Status: Abnormal   Collection Time: 09/12/18 11:53 AM  Result Value Ref Range Status   Specimen Description URINE, RANDOM  Final   Special Requests   Final    Normal Performed at [facility], [address] [place], [place] [address]    Culture (A)  Final    >=100,000 COLONIES/mL MULTIPLE SPECIES PRESENT, SUGGEST RECOLLECTION   Report Status 09/13/2018 FINAL  Final  Wet prep, genital     Status: Abnormal   Collection Time: 09/20/18  6:34 AM  Result Value Ref Range Status   Yeast Wet Prep HPF POC PRESENT (A) NONE SEEN Final   Trich, Wet Prep NONE SEEN NONE SEEN Final   Clue Cells Wet Prep HPF POC PRESENT (A) NONE SEEN Final   WBC, Wet Prep HPF POC MANY (A) NONE SEEN Final    Sperm NONE SEEN  Final    Comment: Performed at [facility], [place] [address] [place], [place] [address]         Radiology Studies: Ct Abdomen Pelvis Wo Contrast  Result Date: 09/20/2018 CLINICAL DATA:  Left-sided abdominal pain with vomiting, diarrhea and fever. Symptoms began yesterday. EXAM: CT ABDOMEN AND PELVIS WITHOUT CONTRAST TECHNIQUE: Multidetector CT imaging of the abdomen and pelvis was performed following the standard protocol without IV contrast. COMPARISON:  09/12/2018 and 05/31/2018 FINDINGS: Lower chest: Subtle dependent bibasilar atelectasis. Hepatobiliary: Previous cholecystectomy. Liver and biliary tree are normal. Pancreas: Normal. Spleen: Normal. Adrenals/Urinary Tract: Adrenal glands are normal. Kidneys are normal in size without hydronephrosis. Punctate nonobstructing stone over the upper pole left kidney. Ureters and bladder are normal. Stomach/Bowel: Stomach and small bowel are normal. Appendix is normal. Mild diverticulosis of the colon. There is an inflamed diverticula over  the distal descending colon in the left lower quadrant. There is no perforation or diverticular abscess. Vascular/Lymphatic: Normal. Reproductive: Uterus and ovaries are unremarkable. Evidence of previous bilateral tubal ligation. Other: None. Musculoskeletal: Normal. IMPRESSION: Evidence of acute diverticulitis involving the distal descending colon in the left lower quadrant. No perforation or abscess. Nonobstructing punctate stone over the upper pole left kidney. Electronically Signed   By: Marin Olp M.D.   On: 09/20/2018 13:18        Scheduled Meds: . enoxaparin (LOVENOX) injection  40 mg Subcutaneous Q24H  . feeding supplement  1 Container Oral TID BM   Continuous Infusions: . sodium chloride Stopped (09/20/18 1808)  . cefTRIAXone (ROCEPHIN)  IV 2 g (09/20/18 1919)  . famotidine (PEPCID) IV    . metronidazole 500 mg (09/21/18 0606)     LOS: 0 days    Time spent:  35 minutes.     Elmarie Shiley, MD [facility] Pager [phone]  If 7PM-7AM, please contact night-coverage [email or web address] Password [facility] 09/21/2018, 8:36 AM

## 2018-09-21 NOTE — Progress Notes (Signed)
Patient called nurse complaining of chest pain.  Stated it on her left shoulder.  Vitals taken shows BP 113/70 and pulse 73.  Pt is alert and  Oriented, and having conversation on the telephone. NP on call was notified and stat-Troponin ordered and pain medication ordered.

## 2018-09-22 LAB — BASIC METABOLIC PANEL
Anion gap: 12 (ref 5–15)
BUN: 8 mg/dL (ref 6–20)
CHLORIDE: 105 mmol/L (ref 98–111)
CO2: 20 mmol/L — ABNORMAL LOW (ref 22–32)
Calcium: 8.6 mg/dL — ABNORMAL LOW (ref 8.9–10.3)
Creatinine, Ser: 0.78 mg/dL (ref 0.44–1.00)
GFR calc Af Amer: >60 mL/min (ref >60)
GFR calc non Af Amer: >60 mL/min (ref >60)
Glucose, Bld: 89 mg/dL (ref 70–99)
Potassium: 3.2 mmol/L — ABNORMAL LOW (ref 3.5–5.1)
Sodium: 137 mmol/L (ref 135–145)

## 2018-09-22 LAB — CBC
HCT: 38.1 % (ref 36.0–46.0)
HEMOGLOBIN: 12.4 g/dL (ref 12.0–15.0)
MCH: 29.3 pg (ref 26.0–34.0)
MCHC: 32.5 g/dL (ref 30.0–36.0)
MCV: 90.1 fL (ref 80.0–100.0)
Platelets: 235 10*3/uL (ref 150–400)
RBC: 4.23 MIL/uL (ref 3.87–5.11)
RDW: 12.2 % (ref 11.5–15.5)
WBC: 5.5 10*3/uL (ref 4.0–10.5)
nRBC: 0 % (ref 0.0–0.2)

## 2018-09-22 MED ORDER — POTASSIUM CHLORIDE 10 MEQ/100ML IV SOLN
10.0000 meq | INTRAVENOUS | Status: AC
  Administered 2018-09-22 (×3): 10 meq via INTRAVENOUS
  Filled 2018-09-22 (×3): qty 100

## 2018-09-22 MED ORDER — METOCLOPRAMIDE HCL 5 MG/ML IJ SOLN
5.0000 mg | Freq: Three times a day (TID) | INTRAMUSCULAR | Status: DC
  Administered 2018-09-22 – 2018-09-26 (×11): 5 mg via INTRAVENOUS
  Filled 2018-09-22 (×12): qty 2

## 2018-09-22 NOTE — Progress Notes (Addendum)
PROGRESS NOTE    Lorraine [name]  CVE:[id number] DOB: 10-04-1985 DOA: 09/20/2018 PCP: Patient, No Pcp Per    Brief Narrative: 33 year old with past medical history significant for asthma, obesity, history of complete pancreatitis, diverticulitis and cholelithiasis status post cholecystectomy now with chronic abdominal pain who presents with vomiting, nausea and left lower quadrant abdominal pain that is started to 3 days prior to admission.  Patient in the ED CT abdomen was consistent with acute sigmoid diverticulitis   Assessment & Plan:   Principal Problem:   Acute diverticulitis Active Problems:   Asthma   Intractable nausea and vomiting   Obesity (BMI 30.0-34.9)  1-Acute uncomplicated sigmoid diverticulitis: Patient with abdominal pain, nausea vomiting. CT abdomen consistent with acute sigmoid diverticulitis. Patient continued to have nausea vomiting.  Continue with bowel rest.   Continue with IV ceftriaxone and Flagyl. Continue with IV dilaudid PRN We will continue with Toradol, will be have to be cautious patient is not been vomiting and has been complaining of chest pain suspect some esophagitis.   Continue with IV Pepcid.  Still vomiting, continue NPO, IV antibiotics. will start Reglan.   2-Bacterial vaginosis, yeast infection. Continue with  IV fluconazole, already on IV Flagyl.  3-Chest pain; She reports chest pressure.  I suspect this is related to frequent vomiting. Continue with  IV Pepcid.  Continue with her albuterol in case that chest pain was related to asthma.  4-Asthma;  Continue with as needed albuterol. Patient is aware there is available for her.  5-Hypokalemia; Replete IV.  Estimated body mass index is 36.68 kg/m as calculated from the following:   Height as of this encounter: 5' (1.524 m).   Weight as of this encounter: 85.2 kg.   DVT prophylaxis: SCDs, Lovenox Code Status: Full code Family Communication: Care discussed with  patient Disposition Plan: Patient with persistent nausea and vomiting, still with abdominal pain.  We will continue with n.p.o. status.  IV fluids.  IV antibiotics.   Consultants:   none   Procedures:   none  Antimicrobials:  Ceftriaxone  Flagyl;   Subjective: Pain persist, still vomiting.  Objective: Vitals:   09/21/18 1326 09/21/18 2058 09/22/18 0519 09/22/18 1354  BP: 131/82 121/73 131/81 108/62  Pulse: 91 88 81 68  Resp: 16 16 16    Temp: 98.2 F (36.8 C) 99 F (37.2 C) 98.3 F (36.8 C) 98.1 F (36.7 C)  TempSrc: Oral Oral Oral Oral  SpO2: 99% 98% 98% 100%  Weight:      Height:        Intake/Output Summary (Last 24 hours) at 09/22/2018 1446 Last data filed at 09/22/2018 1426 Gross per 24 hour  Intake 1415.9 ml  Output -  Net 1415.9 ml   Filed Weights   09/20/18 0829  Weight: 85.2 kg    Examination:  General exam: NAD Respiratory system: CTA Cardiovascular system: S 1, S 2 RRR Gastrointestinal system:BS present, soft, left LQ tenderness/  Central nervous system: non focal.  Extremities: symmetric power.  Skin:no rashes.    Data Reviewed: I have personally reviewed following labs and imaging studies  CBC: Recent Labs  Lab 09/18/18 2002 09/20/18 0511 09/21/18 0252 09/22/18 0910  WBC 7.9 9.7 5.6 5.5  HGB 13.2 13.2 12.2 12.4  HCT 40.9 41.0 37.7 38.1  MCV 89.9 89.9 91.5 90.1  PLT 298 272 244 025   Basic Metabolic Panel: Recent Labs  Lab 09/18/18 2002 09/20/18 0511 09/21/18 0252 09/22/18 0910  NA 142 139 139 137  K  3.7 3.5 3.4* 3.2*  CL 108 104 107 105  CO2 26 24 23  20*  GLUCOSE 105* 108* 91 89  BUN 15 17 8 8   CREATININE 0.91 0.84 0.66 0.78  CALCIUM 9.2 9.3 8.2* 8.6*   GFR: Estimated Creatinine Clearance: 97.9 mL/min (by C-G formula based on SCr of 0.78 mg/dL). Liver Function Tests: Recent Labs  Lab 09/20/18 0511  AST 28  ALT 36  ALKPHOS 95  BILITOT 0.3  PROT 7.4  ALBUMIN 4.3   Recent Labs  Lab 09/20/18 0511  LIPASE 39    No results for input(s): AMMONIA in the last 168 hours. Coagulation Profile: No results for input(s): INR, PROTIME in the last 168 hours. Cardiac Enzymes: Recent Labs  Lab 09/21/18 0252  TROPONINI <0.03   BNP (last 3 results) No results for input(s): PROBNP in the last 8760 hours. HbA1C: No results for input(s): HGBA1C in the last 72 hours. CBG: No results for input(s): GLUCAP in the last 168 hours. Lipid Profile: No results for input(s): CHOL, HDL, LDLCALC, TRIG, CHOLHDL, LDLDIRECT in the last 72 hours. Thyroid Function Tests: No results for input(s): TSH, T4TOTAL, FREET4, T3FREE, THYROIDAB in the last 72 hours. Anemia Panel: No results for input(s): VITAMINB12, FOLATE, FERRITIN, TIBC, IRON, RETICCTPCT in the last 72 hours. Sepsis Labs: No results for input(s): PROCALCITON, LATICACIDVEN in the last 168 hours.  Recent Results (from the past 240 hour(s))  Wet prep, genital     Status: Abnormal   Collection Time: 09/20/18  6:34 AM  Result Value Ref Range Status   Yeast Wet Prep HPF POC PRESENT (A) NONE SEEN Final   Trich, Wet Prep NONE SEEN NONE SEEN Final   Clue Cells Wet Prep HPF POC PRESENT (A) NONE SEEN Final   WBC, Wet Prep HPF POC MANY (A) NONE SEEN Final   Sperm NONE SEEN  Final    Comment: Performed at [facility], [place] [address], [place],  [address]         Radiology Studies: No results found.      Scheduled Meds: . enoxaparin (LOVENOX) injection  40 mg Subcutaneous Q24H  . feeding supplement  1 Container Oral TID BM   Continuous Infusions: . sodium chloride 100 mL/hr at 09/22/18 0200  . cefTRIAXone (ROCEPHIN)  IV Stopped (09/21/18 2118)  . famotidine (PEPCID) IV 20 mg (09/22/18 1257)  . fluconazole (DIFLUCAN) IV 100 mg (09/22/18 1355)  . metronidazole 500 mg (09/22/18 6767)  . potassium chloride       LOS: 0 days    Time spent: 35 minutes.     Elmarie Shiley, MD [facility] Pager [phone]  If  7PM-7AM, please contact night-coverage [email or web address] Password [facility] 09/22/2018, 2:46 PM

## 2018-09-22 NOTE — Progress Notes (Signed)
Initial Nutrition Assessment  DOCUMENTATION CODES:   Obesity unspecified  INTERVENTION:    Ensure Enlive po BID, each supplement provides 350 kcal and 20 grams of protein once diet advanced   Provide MVI daily  NUTRITION DIAGNOSIS:   Inadequate oral intake related to poor appetite as evidenced by per patient/family report.  GOAL:   Patient will meet greater than or equal to 90% of their needs  MONITOR:   PO intake, Supplement acceptance, Weight trends, Labs, Diet advancement  REASON FOR ASSESSMENT:   Malnutrition Screening Tool    ASSESSMENT:   Patient with PMH significant for pancreatitis, diverticulitis, cholelithiasis s/p cholecystectomy now with chronic abdominal pain (including previous admissions for intractable nausea without diagnosis) who presents with vomiting and LLQ abdominal pain for 1 day. Found to have acute uncomplicated diverticulitis.    Pt states her appetite was great PTA but has declined significantly this admission. States over the last two days she experienced nausea and had a vomiting episode this morning (this has since resolved). Pt was made NPO on 1/5 for bowel rest (per MD). She attempted to drink Boost Breeze yesterday but did not like the taste. Discussed other supplement options for when diet is advanced.   Pt endorses a 4 lb wt loss but is unsure of her UBW. Records indicate pt weighed 198 lb on 06/29/18 and 187 lb this admission (5.5% wt loss in 3 months, insignificant for time frame).   Medications reviewed.  Labs reviewed: K 3.2 (L)   NUTRITION - FOCUSED PHYSICAL EXAM:    Most Recent Value  Orbital Region  No depletion  Upper Arm Region  No depletion  Thoracic and Lumbar Region  Severe depletion  Buccal Region  No depletion  Temple Region  No depletion  Clavicle Bone Region  No depletion  Clavicle and Acromion Bone Region  No depletion  Scapular Bone Region  Unable to assess  Dorsal Hand  No depletion  Patellar Region  No  depletion  Anterior Thigh Region  No depletion  Posterior Calf Region  No depletion  Edema (RD Assessment)  None     Diet Order:   Diet Order            Diet NPO time specified  Diet effective now              EDUCATION NEEDS:   Not appropriate for education at this time  Skin:  Skin Assessment: Reviewed RN Assessment  Last BM:  09/20/17  Height:   Ht Readings from Last 1 Encounters:  09/20/18 5' (1.524 m)    Weight:   Wt Readings from Last 1 Encounters:  09/20/18 85.2 kg    Ideal Body Weight:  45.5 kg  BMI:  Body mass index is 36.68 kg/m.  Estimated Nutritional Needs:   Kcal:  1500-1700 kcal  Protein:  75-90 grams  Fluid:  >/= 1.5 L/day   Mariana Single RD, LDN Clinical Nutrition Pager # - [phone]

## 2018-09-23 LAB — BASIC METABOLIC PANEL
Anion gap: 6 (ref 5–15)
BUN: 9 mg/dL (ref 6–20)
CO2: 25 mmol/L (ref 22–32)
Calcium: 8.4 mg/dL — ABNORMAL LOW (ref 8.9–10.3)
Chloride: 107 mmol/L (ref 98–111)
Creatinine, Ser: 0.76 mg/dL (ref 0.44–1.00)
GFR calc Af Amer: >60 mL/min (ref >60)
GFR calc non Af Amer: >60 mL/min (ref >60)
Glucose, Bld: 106 mg/dL — ABNORMAL HIGH (ref 70–99)
Potassium: 3.8 mmol/L (ref 3.5–5.1)
SODIUM: 138 mmol/L (ref 135–145)

## 2018-09-23 MED ORDER — HYDROMORPHONE HCL 1 MG/ML IJ SOLN: 0.5000 mg | Freq: Four times a day (QID) | INTRAMUSCULAR | Status: DC | PRN

## 2018-09-23 NOTE — Progress Notes (Signed)
PROGRESS NOTE    Lorraine [name]  GSU:[id number] DOB: October 29, 1985 DOA: 09/20/2018 PCP: Patient, No Pcp Per    Brief Narrative: 33 year old with past medical history significant for asthma, obesity, history of complete pancreatitis, diverticulitis and cholelithiasis status post cholecystectomy now with chronic abdominal pain who presents with vomiting, nausea and left lower quadrant abdominal pain that is started to 3 days prior to admission.  Patient in the ED CT abdomen was consistent with acute sigmoid diverticulitis.  Patient was admitted with acute sigmoid diverticulitis, she was a started on IV fluids, IV antibiotics.  Patient continued to have nausea and vomiting.  Continue with n.p.o. status.  Patient has remained afebrile and no leukocytosis.  If no significant improvement in the next 24 to 48 hours might need repeat a CT abdomen.  Assessment & Plan:   Principal Problem:   Acute diverticulitis Active Problems:   Asthma   Intractable nausea and vomiting   Obesity (BMI 30.0-34.9)  1-Acute uncomplicated sigmoid diverticulitis: Patient with abdominal pain, nausea vomiting. CT abdomen consistent with acute sigmoid diverticulitis. Patient continued to have nausea vomiting.  Continue with bowel rest.   Continue with IV ceftriaxone and Flagyl. Continue with IV dilaudid PRN We will continue with Toradol, will be have to be cautious patient is not been vomiting and has been complaining of chest pain suspect some esophagitis.   Continue with IV Pepcid.    2-Bacterial vaginosis, yeast infection. Continue with  IV fluconazole, already on IV Flagyl.  3-Chest pain; She reports chest pressure.  I suspect this is related to frequent vomiting. Continue with  IV Pepcid.  Continue with her albuterol in case that chest pain was related to asthma. Chest pain-free today.  4-Asthma;  Continue with as needed albuterol. Patient is aware there is available for  her. Stable.  5-Hypokalemia; Resolved  Estimated body mass index is 36.68 kg/m as calculated from the following:   Height as of this encounter: 5' (1.524 m).   Weight as of this encounter: 85.2 kg.   DVT prophylaxis: SCDs, Lovenox Code Status: Full code Family Communication: Care discussed with patient Disposition Plan: Patient with persistent nausea and vomiting, still with abdominal pain.  We will continue with n.p.o. status.  IV fluids.  IV antibiotics.   Consultants:   none   Procedures:   none  Antimicrobials:  Ceftriaxone  Flagyl;   Subjective: Patient reports still been vomiting, although nursing staff report that she has been spiting.    Objective: Vitals:   09/22/18 1354 09/22/18 2210 09/23/18 0557 09/23/18 1408  BP: 108/62 129/84 114/74 127/77  Pulse: 68 67 64 66  Resp:  16 18 17   Temp: 98.1 F (36.7 C) 98.7 F (37.1 C) 97.8 F (36.6 C) 98.8 F (37.1 C)  TempSrc: Oral Oral Oral Oral  SpO2: 100% 98% 97% 98%  Weight:      Height:        Intake/Output Summary (Last 24 hours) at 09/23/2018 1702 Last data filed at 09/23/2018 1500 Gross per 24 hour  Intake 2021.58 ml  Output 0 ml  Net 2021.58 ml   Filed Weights   09/20/18 0829  Weight: 85.2 kg    Examination:  General exam: Not acute distress Respiratory system: Clear to auscultation Cardiovascular system: S1, S2 regular rhythm and rate Gastrointestinal system: Bowel sounds present, soft, mild left lower quadrant tenderness. Central nervous system: No focal Extremities: Symmetric power Skin:no rashes.    Data Reviewed: I have personally reviewed following labs and imaging studies  CBC: Recent Labs  Lab 09/18/18 2002 09/20/18 0511 09/21/18 0252 09/22/18 0910  WBC 7.9 9.7 5.6 5.5  HGB 13.2 13.2 12.2 12.4  HCT 40.9 41.0 37.7 38.1  MCV 89.9 89.9 91.5 90.1  PLT 298 272 244 413   Basic Metabolic Panel: Recent Labs  Lab 09/18/18 2002 09/20/18 0511 09/21/18 0252 09/22/18 0910  09/23/18 0515  NA 142 139 139 137 138  K 3.7 3.5 3.4* 3.2* 3.8  CL 108 104 107 105 107  CO2 26 24 23  20* 25  GLUCOSE 105* 108* 91 89 106*  BUN 15 17 8 8 9   CREATININE 0.91 0.84 0.66 0.78 0.76  CALCIUM 9.2 9.3 8.2* 8.6* 8.4*   GFR: Estimated Creatinine Clearance: 97.9 mL/min (by C-G formula based on SCr of 0.76 mg/dL). Liver Function Tests: Recent Labs  Lab 09/20/18 0511  AST 28  ALT 36  ALKPHOS 95  BILITOT 0.3  PROT 7.4  ALBUMIN 4.3   Recent Labs  Lab 09/20/18 0511  LIPASE 39   No results for input(s): AMMONIA in the last 168 hours. Coagulation Profile: No results for input(s): INR, PROTIME in the last 168 hours. Cardiac Enzymes: Recent Labs  Lab 09/21/18 0252  TROPONINI <0.03   BNP (last 3 results) No results for input(s): PROBNP in the last 8760 hours. HbA1C: No results for input(s): HGBA1C in the last 72 hours. CBG: No results for input(s): GLUCAP in the last 168 hours. Lipid Profile: No results for input(s): CHOL, HDL, LDLCALC, TRIG, CHOLHDL, LDLDIRECT in the last 72 hours. Thyroid Function Tests: No results for input(s): TSH, T4TOTAL, FREET4, T3FREE, THYROIDAB in the last 72 hours. Anemia Panel: No results for input(s): VITAMINB12, FOLATE, FERRITIN, TIBC, IRON, RETICCTPCT in the last 72 hours. Sepsis Labs: No results for input(s): PROCALCITON, LATICACIDVEN in the last 168 hours.  Recent Results (from the past 240 hour(s))  Wet prep, genital     Status: Abnormal   Collection Time: 09/20/18  6:34 AM  Result Value Ref Range Status   Yeast Wet Prep HPF POC PRESENT (A) NONE SEEN Final   Trich, Wet Prep NONE SEEN NONE SEEN Final   Clue Cells Wet Prep HPF POC PRESENT (A) NONE SEEN Final   WBC, Wet Prep HPF POC MANY (A) NONE SEEN Final   Sperm NONE SEEN  Final    Comment: Performed at [facility], [place] [address] [place], [place] [address]         Radiology Studies: No results found.      Scheduled Meds: . enoxaparin  (LOVENOX) injection  40 mg Subcutaneous Q24H  . feeding supplement  1 Container Oral TID BM  . metoCLOPramide (REGLAN) injection  5 mg Intravenous Q8H   Continuous Infusions: . sodium chloride 100 mL/hr at 09/23/18 0757  . cefTRIAXone (ROCEPHIN)  IV Stopped (09/22/18 2251)  . famotidine (PEPCID) IV 20 mg (09/23/18 1044)  . fluconazole (DIFLUCAN) IV 100 mg (09/23/18 0906)  . metronidazole 500 mg (09/23/18 1600)     LOS: 1 day    Time spent: 35 minutes.     Elmarie Shiley, MD [facility] Pager [phone]  If 7PM-7AM, please contact night-coverage [email or web address] Password [email or web address] 09/23/2018, 5:02 PM

## 2018-09-24 DIAGNOSIS — R1110 Vomiting, unspecified: Secondary | ICD-10-CM

## 2018-09-24 DIAGNOSIS — E876 Hypokalemia: Secondary | ICD-10-CM

## 2018-09-24 DIAGNOSIS — B9689 Other specified bacterial agents as the cause of diseases classified elsewhere: Secondary | ICD-10-CM

## 2018-09-24 DIAGNOSIS — N760 Acute vaginitis: Secondary | ICD-10-CM

## 2018-09-24 LAB — BASIC METABOLIC PANEL
Anion gap: 7 (ref 5–15)
BUN: 7 mg/dL (ref 6–20)
CALCIUM: 8.4 mg/dL — AB (ref 8.9–10.3)
CO2: 26 mmol/L (ref 22–32)
Chloride: 108 mmol/L (ref 98–111)
Creatinine, Ser: 0.79 mg/dL (ref 0.44–1.00)
GFR calc Af Amer: >60 mL/min (ref >60)
GFR calc non Af Amer: >60 mL/min (ref >60)
Glucose, Bld: 109 mg/dL — ABNORMAL HIGH (ref 70–99)
Potassium: 3.5 mmol/L (ref 3.5–5.1)
SODIUM: 141 mmol/L (ref 135–145)

## 2018-09-24 LAB — CBC
HCT: 37 % (ref 36.0–46.0)
Hemoglobin: 12.1 g/dL (ref 12.0–15.0)
MCH: 30.2 pg (ref 26.0–34.0)
MCHC: 32.7 g/dL (ref 30.0–36.0)
MCV: 92.3 fL (ref 80.0–100.0)
Platelets: 236 10*3/uL (ref 150–400)
RBC: 4.01 MIL/uL (ref 3.87–5.11)
RDW: 12.5 % (ref 11.5–15.5)
WBC: 4.8 10*3/uL (ref 4.0–10.5)
nRBC: 0 % (ref 0.0–0.2)

## 2018-09-24 MED ORDER — POTASSIUM CHLORIDE CRYS ER 20 MEQ PO TBCR
40.0000 meq | EXTENDED_RELEASE_TABLET | Freq: Once | ORAL | Status: AC
  Administered 2018-09-24: 40 meq via ORAL
  Filled 2018-09-24: qty 2

## 2018-09-24 MED ORDER — AMOXICILLIN-POT CLAVULANATE 875-125 MG PO TABS
1.0000 | ORAL_TABLET | Freq: Two times a day (BID) | ORAL | Status: DC
  Administered 2018-09-24 – 2018-09-26 (×4): 1 via ORAL
  Filled 2018-09-24 (×5): qty 1

## 2018-09-24 MED ORDER — PANTOPRAZOLE SODIUM 20 MG PO TBEC
20.0000 mg | DELAYED_RELEASE_TABLET | Freq: Every day | ORAL | Status: DC
  Administered 2018-09-25 – 2018-09-26 (×2): 20 mg via ORAL
  Filled 2018-09-24 (×2): qty 1

## 2018-09-24 MED ORDER — METRONIDAZOLE 500 MG PO TABS
500.0000 mg | ORAL_TABLET | Freq: Two times a day (BID) | ORAL | Status: DC
  Administered 2018-09-24 – 2018-09-26 (×4): 500 mg via ORAL
  Filled 2018-09-24 (×4): qty 1

## 2018-09-24 NOTE — Progress Notes (Signed)
PROGRESS NOTE  Lorraine [name] YJE:[id number] DOB: June 06, 1986 DOA: 09/20/2018 PCP: Patient, No Pcp Per  HPI/Recap of past 24 hours:  Currently denies pain, no fever, no active nausea vomiting  Assessment/Plan: Principal Problem:   Acute diverticulitis Active Problems:   Asthma   Intractable nausea and vomiting   Obesity (BMI 30.0-34.9)  Acute uncomplicated sigmoid diverticulitis: Patient presents  with abdominal pain, nausea vomiting. CT abdomen consistent with acute sigmoid diverticulitis. She has been on bowel rest and  IV ceftriaxone and Flagyl. Appear improving, no fever no leukocytosis.  No active vomiting, start clears, ambulate Change antibiotic to Augmentin.     N/V: Per chart review she was hospitalized in 2018 due to nausea and vomiting at the time was thought possibly secondary to gastroparesis although gastric emptying study at the time was normal GI was consulted in 2018 recommended Reglan, she is currently on IV Reglan, will continue, sensation to oral Reglan when able to take oral reliably. change IV Pepcid to protonix to prevent multiple qt prolonging agent  Hypokalemia -Replace, check mag   Bacterial vaginosis, yeast infection. Patient was treated with IV Diflucan for two days, will d/c diflucan Change  IV Flagyl to oral to finish total of 7 days treatment  Chest pain; She reports chest pressure.  I suspect this is related to frequent vomiting. Was on IV Pepcid, change to ppi Continue with her albuterol in case that chest pain was related to asthma. Chest pain-free today. No wheezing, no cough  Asthma;  Continue with as needed albuterol. Patient is aware there is available for her. No wheezing, no cough, no hypoxia Stable.  Obesity: Body mass index is 36.68 kg/m.   Code Status: full  Family Communication: patient   Disposition Plan: Home when able to tolerate oral  intake   Consultants:  none  Procedures:  none  Antibiotics:  As above   Objective: BP 132/70 (BP Location: Left Arm)   Pulse 67   Temp 98.8 F (37.1 C) (Oral)   Resp 18   Ht 5' (1.524 m)   Wt 85.2 kg   LMP 09/15/2018 Comment: neg hcg today  SpO2 99%   BMI 36.68 kg/m   Intake/Output Summary (Last 24 hours) at 09/24/2018 1540 Last data filed at 09/24/2018 1400 Gross per 24 hour  Intake 2448.79 ml  Output 0 ml  Net 2448.79 ml   Filed Weights   09/20/18 0829  Weight: 85.2 kg    Exam: Patient is examined daily including today on 09/24/2018, exams remain the same as of yesterday except that has changed    General:  NAD  Cardiovascular: RRR  Respiratory: CTABL  Abdomen: Soft/ND/NT, positive BS  Musculoskeletal: No Edema  Neuro: alert, oriented   Data Reviewed: Basic Metabolic Panel: Recent Labs  Lab 09/20/18 0511 09/21/18 0252 09/22/18 0910 09/23/18 0515 09/24/18 0423  NA 139 139 137 138 141  K 3.5 3.4* 3.2* 3.8 3.5  CL 104 107 105 107 108  CO2 24 23 20* 25 26  GLUCOSE 108* 91 89 106* 109*  BUN 17 8 8 9 7   CREATININE 0.84 0.66 0.78 0.76 0.79  CALCIUM 9.3 8.2* 8.6* 8.4* 8.4*   Liver Function Tests: Recent Labs  Lab 09/20/18 0511  AST 28  ALT 36  ALKPHOS 95  BILITOT 0.3  PROT 7.4  ALBUMIN 4.3   Recent Labs  Lab 09/20/18 0511  LIPASE 39   No results for input(s): AMMONIA in the last 168 hours. CBC: Recent Labs  Lab  09/18/18 2002 09/20/18 0511 09/21/18 0252 09/22/18 0910 09/24/18 0423  WBC 7.9 9.7 5.6 5.5 4.8  HGB 13.2 13.2 12.2 12.4 12.1  HCT 40.9 41.0 37.7 38.1 37.0  MCV 89.9 89.9 91.5 90.1 92.3  PLT 298 272 244 235 236   Cardiac Enzymes:   Recent Labs  Lab 09/21/18 0252  TROPONINI <0.03   BNP (last 3 results) No results for input(s): BNP in the last 8760 hours.  ProBNP (last 3 results) No results for input(s): PROBNP in the last 8760 hours.  CBG: No results for input(s): GLUCAP in the last 168 hours.  Recent  Results (from the past 240 hour(s))  Wet prep, genital     Status: Abnormal   Collection Time: 09/20/18  6:34 AM  Result Value Ref Range Status   Yeast Wet Prep HPF POC PRESENT (A) NONE SEEN Final   Trich, Wet Prep NONE SEEN NONE SEEN Final   Clue Cells Wet Prep HPF POC PRESENT (A) NONE SEEN Final   WBC, Wet Prep HPF POC MANY (A) NONE SEEN Final   Sperm NONE SEEN  Final    Comment: Performed at [facility], [place] [address] [place], [place] [address]     Studies: No results found.  Scheduled Meds: . amoxicillin-clavulanate  1 tablet Oral Q12H  . enoxaparin (LOVENOX) injection  40 mg Subcutaneous Q24H  . feeding supplement  1 Container Oral TID BM  . metoCLOPramide (REGLAN) injection  5 mg Intravenous Q8H  . metroNIDAZOLE  500 mg Oral Q12H  . potassium chloride  40 mEq Oral Once    Continuous Infusions: . famotidine (PEPCID) IV 20 mg (09/24/18 1037)     Time spent: 7mins I have personally reviewed and interpreted on  09/24/2018 daily labs, imagings as discussed above under date review session and assessment and plans.  I reviewed all nursing notes, pharmacy notes,  vitals, pertinent old records  I have discussed plan of care as described above with RN , patient  on 09/24/2018   Florencia Reasons MD, PhD  [facility] Pager [phone]. If 7PM-7AM, please contact night-coverage at [email or web address], password [facility] 09/24/2018, 3:40 PM  LOS: 2 days

## 2018-09-25 LAB — BASIC METABOLIC PANEL
Anion gap: 9 (ref 5–15)
BUN: 6 mg/dL (ref 6–20)
CHLORIDE: 109 mmol/L (ref 98–111)
CO2: 23 mmol/L (ref 22–32)
CREATININE: 0.64 mg/dL (ref 0.44–1.00)
Calcium: 8.8 mg/dL — ABNORMAL LOW (ref 8.9–10.3)
GFR calc Af Amer: >60 mL/min (ref >60)
GFR calc non Af Amer: >60 mL/min (ref >60)
Glucose, Bld: 142 mg/dL — ABNORMAL HIGH (ref 70–99)
Potassium: 3.4 mmol/L — ABNORMAL LOW (ref 3.5–5.1)
Sodium: 141 mmol/L (ref 135–145)

## 2018-09-25 LAB — MAGNESIUM: Magnesium: 1.9 mg/dL (ref 1.7–2.4)

## 2018-09-25 MED ORDER — POTASSIUM CHLORIDE CRYS ER 20 MEQ PO TBCR
40.0000 meq | EXTENDED_RELEASE_TABLET | Freq: Once | ORAL | Status: AC
  Administered 2018-09-25: 40 meq via ORAL
  Filled 2018-09-25: qty 2

## 2018-09-25 NOTE — Progress Notes (Signed)
PROGRESS NOTE  Lorraine [name] [id number]:[id number] DOB: 10/19/1985 DOA: 09/20/2018 PCP: Patient, No Pcp Per  HPI/Recap of past 24 hours:  Report ab pain has resolved, but Remain feeling nauseous, does not want to eat, no vomiting, having bm, no fever Husband at bedside  Assessment/Plan: Principal Problem:   Acute diverticulitis Active Problems:   Asthma   Intractable nausea and vomiting   Obesity (BMI 30.0-34.9)   Intractable vomiting  Acute uncomplicated sigmoid diverticulitis: Patient presents  with abdominal pain, nausea vomiting. CT abdomen consistent with acute sigmoid diverticulitis. She has been on bowel rest and  IV ceftriaxone and Flagyl. Appear improving, no fever no leukocytosis.  No active vomiting, start clears, ambulate Change antibiotic to Augmentin.    N/V: Per chart review she was hospitalized in 2018 due to nausea and vomiting at the time was thought possibly secondary to gastroparesis although gastric emptying study at the time was normal GI was consulted in 2018 recommended Reglan, she is currently on IV Reglan, will continue, sensation to oral Reglan when able to take oral reliably. change IV Pepcid to protonix to prevent multiple qt prolonging agent  Hypokalemia -remain low, continue to Replace, mag 1.9   Bacterial vaginosis, yeast infection. Patient was treated with IV Diflucan for two days, will d/c diflucan Change  IV Flagyl to oral to finish total of 7 days treatment  Chest pain; She reports chest pressure. I suspect this is related to frequent vomiting. Was onIV Pepcid, change to ppi Continue with her albuterol in case that chest pain was related to asthma. Chest pain-free today. No wheezing, no cough  Asthma;  Continue with as needed albuterol. Patient is aware there is available for her. No wheezing, no cough, no hypoxia Stable.  Obesity: Body mass index is 36.68 kg/m.   Code Status: full  Family Communication: patient  and husband   Disposition Plan: Home when able to tolerate oral intake, likely tomorrow    Consultants:  none  Procedures:  none  Antibiotics:  As above   Objective: BP 122/77 (BP Location: Left Arm)   Pulse 83   Temp 98.3 F (36.8 C) (Oral)   Resp 17   Ht 5' (1.524 m)   Wt 85.2 kg   LMP 09/15/2018 Comment: neg hcg today  SpO2 99%   BMI 36.68 kg/m   Intake/Output Summary (Last 24 hours) at 09/25/2018 1122 Last data filed at 09/25/2018 0918 Gross per 24 hour  Intake 960 ml  Output 0 ml  Net 960 ml   Filed Weights   09/20/18 0829  Weight: 85.2 kg    Exam: Patient is examined daily including today on 09/25/2018, exams remain the same as of yesterday except that has changed    General:  NAD  Cardiovascular: RRR  Respiratory: CTABL  Abdomen: Soft/ND/NT, positive BS  Musculoskeletal: No Edema  Neuro: alert, oriented   Data Reviewed: Basic Metabolic Panel: Recent Labs  Lab 09/21/18 0252 09/22/18 0910 09/23/18 0515 09/24/18 0423 09/25/18 0501  NA 139 137 138 141 141  K 3.4* 3.2* 3.8 3.5 3.4*  CL 107 105 107 108 109  CO2 23 20* 25 26 23   GLUCOSE 91 89 106* 109* 142*  BUN 8 8 9 7 6   CREATININE 0.66 0.78 0.76 0.79 0.64  CALCIUM 8.2* 8.6* 8.4* 8.4* 8.8*  MG  --   --   --   --  1.9   Liver Function Tests: Recent Labs  Lab 09/20/18 0511  AST 28  ALT 36  ALKPHOS 95  BILITOT 0.3  PROT 7.4  ALBUMIN 4.3   Recent Labs  Lab 09/20/18 0511  LIPASE 39   No results for input(s): AMMONIA in the last 168 hours. CBC: Recent Labs  Lab 09/18/18 2002 09/20/18 0511 09/21/18 0252 09/22/18 0910 09/24/18 0423  WBC 7.9 9.7 5.6 5.5 4.8  HGB 13.2 13.2 12.2 12.4 12.1  HCT 40.9 41.0 37.7 38.1 37.0  MCV 89.9 89.9 91.5 90.1 92.3  PLT 298 272 244 235 236   Cardiac Enzymes:   Recent Labs  Lab 09/21/18 0252  TROPONINI <0.03   BNP (last 3 results) No results for input(s): BNP in the last 8760 hours.  ProBNP (last 3 results) No results for  input(s): PROBNP in the last 8760 hours.  CBG: No results for input(s): GLUCAP in the last 168 hours.  Recent Results (from the past 240 hour(s))  Wet prep, genital     Status: Abnormal   Collection Time: 09/20/18  6:34 AM  Result Value Ref Range Status   Yeast Wet Prep HPF POC PRESENT (A) NONE SEEN Final   Trich, Wet Prep NONE SEEN NONE SEEN Final   Clue Cells Wet Prep HPF POC PRESENT (A) NONE SEEN Final   WBC, Wet Prep HPF POC MANY (A) NONE SEEN Final   Sperm NONE SEEN  Final    Comment: Performed at [facility], [place] [address], [place], [place] [address]     Studies: No results found.  Scheduled Meds: . amoxicillin-clavulanate  1 tablet Oral Q12H  . enoxaparin (LOVENOX) injection  40 mg Subcutaneous Q24H  . feeding supplement  1 Container Oral TID BM  . metoCLOPramide (REGLAN) injection  5 mg Intravenous Q8H  . metroNIDAZOLE  500 mg Oral Q12H  . pantoprazole  20 mg Oral Daily    Continuous Infusions:   Time spent: 57mins I have personally reviewed and interpreted on  09/25/2018 daily labs,  imagings as discussed above under date review session and assessment and plans.  I reviewed all nursing notes, pharmacy notes, vitals, pertinent old records  I have discussed plan of care as described above with RN , patient and family on 09/25/2018   Florencia Reasons MD, PhD  [facility] Pager [phone]. If 7PM-7AM, please contact night-coverage at [email or web address], password [facility] 09/25/2018, 11:22 AM  LOS: 3 days

## 2018-09-26 LAB — BASIC METABOLIC PANEL
Anion gap: 10 (ref 5–15)
BUN: 13 mg/dL (ref 6–20)
CO2: 23 mmol/L (ref 22–32)
Calcium: 9.1 mg/dL (ref 8.9–10.3)
Chloride: 108 mmol/L (ref 98–111)
Creatinine, Ser: 0.8 mg/dL (ref 0.44–1.00)
GFR calc Af Amer: >60 mL/min (ref >60)
GFR calc non Af Amer: >60 mL/min (ref >60)
Glucose, Bld: 99 mg/dL (ref 70–99)
POTASSIUM: 3.4 mmol/L — AB (ref 3.5–5.1)
Sodium: 141 mmol/L (ref 135–145)

## 2018-09-26 LAB — MAGNESIUM: Magnesium: 1.9 mg/dL (ref 1.7–2.4)

## 2018-09-26 MED ORDER — AMOXICILLIN-POT CLAVULANATE 875-125 MG PO TABS: 1.0000 | ORAL_TABLET | Freq: Two times a day (BID) | ORAL | 0 refills | Status: AC

## 2018-09-26 MED ORDER — POLYETHYLENE GLYCOL 3350 17 G PO PACK: 17.0000 g | PACK | Freq: Every day | ORAL | 0 refills | Status: DC

## 2018-09-26 MED ORDER — METRONIDAZOLE 500 MG PO TABS: 500.0000 mg | ORAL_TABLET | Freq: Two times a day (BID) | ORAL | 0 refills | Status: AC

## 2018-09-26 MED ORDER — POTASSIUM CHLORIDE CRYS ER 20 MEQ PO TBCR
40.0000 meq | EXTENDED_RELEASE_TABLET | Freq: Once | ORAL | Status: AC
  Administered 2018-09-26: 40 meq via ORAL
  Filled 2018-09-26: qty 2

## 2018-09-26 MED ORDER — METOCLOPRAMIDE HCL 10 MG PO TABS: 10.0000 mg | ORAL_TABLET | Freq: Three times a day (TID) | ORAL | 0 refills | Status: DC | PRN

## 2018-09-26 NOTE — Progress Notes (Signed)
RN reviewed discharge instructions with patient and husband. All questions answered.   Prescriptions sent to [facility] on [address] and patient confirmed that's the pharmacy they use.   RN instructed patient about how to take antibiotics. Antibiotic education sheet in Spanish given to patient, and patient read education sheet at bedside before she was discharged home.   NT rolled patient down with all belongings to family car.

## 2018-09-26 NOTE — Progress Notes (Signed)
Husband brought patient's son to room again today. Husband and patient informed of Flu advisory, and that their son needs to leave. Husband states that the doctor told him yesterday that his wife would be going home, so he came to pick up his wife, and their is no one else to watch their son.   Instructed about washing hands, using a mask to cover his face, and not leaving the patients room. I will page the attending MD to see if patient can be discharged this morning so that the son can leave, and decrease risk of any transmission of infection.

## 2018-09-26 NOTE — Discharge Summary (Signed)
Discharge Summary  Lorraine [name] FYB:[id number] DOB: 1986/02/06  PCP: Patient, No Pcp Per  Admit date: 09/20/2018 Discharge date: 09/26/2018  Time spent: 91mins  Recommendations for Outpatient Follow-up:  1. F/u with PCP within a week  for hospital discharge follow up, repeat cbc/bmp at follow up.  Discharge Diagnoses:  Active Hospital Problems   Diagnosis Date Noted  . Acute diverticulitis 09/20/2018  . Intractable vomiting   . Obesity (BMI 30.0-34.9) 04/02/2017  . Intractable nausea and vomiting 04/02/2017  . Asthma 10/31/2014    Resolved Hospital Problems  No resolved problems to display.    Discharge Condition: stable  Diet recommendation: regular diet  Filed Weights   09/20/18 0829  Weight: 85.2 kg    History of present illness: (per admitting MD Dr Loleta Books) Patient coming from: Home  Chief Complaint: LLQ abdominal pain      HPI: Lorraine [name] is a 33 y.o. F with hx asthma, obesity, hx pancreatitis, diverticulitis and cholelithiasis s/p cholecystectomy now with chronic abdominal pain (including previous admissions for intractable nausea without diagnosis) who presents with vomiting and LLQ abdominal pain for 1 day.  All history obtained via videophonic interpreter.  The patient reports she was fine until 24 hours ago, she developed new colicky severe LLQ abdominal pain.  This was a new pain, she feels, and different from her chronic intermittent pain episodes.  This was not associated with diarrhea or hematochezia, but was associated with fever, vomiting and nausea, so she came to the ER  ED course: -Afebrile, heart rate 70, respirations and pulse ox normal, blood pressure 125/79 -Na 139, K 3.5, Cr 0.8, WBC 9.7K, Hgb 13.2 - Urinalysis without pyuria or hematuria - Lipase normal -Pregnancy test negative - Thick exam was obtained due to complaints of vaginal discharge and wet prep was done which showed clue cells and yeast -CT of the abdomen  and pelvis without contrast was obtained that showed acute sigmoid diverticulitis -The patient repeatedly tried oral intake over several hours and was unable to tolerate anything by mouth, and Vomiting, and so the hospitalist group were asked to evaluate for diverticulitis    Hospital Course:  Principal Problem:   Acute diverticulitis Active Problems:   Asthma   Intractable nausea and vomiting   Obesity (BMI 30.0-34.9)   Intractable vomiting  Acute uncomplicated sigmoid diverticulitis: Patientpresentswith abdominal pain, nausea, vomiting. CT abdomen consistent with acute sigmoid diverticulitis. She was on bowel rest andIV ceftriaxone and Flagyl initially  no fever no leukocytosis.No active vomiting,start clears,ambulate,  antibiotic changed to Augmentin. She continue to improved, symptom resolved, tolerating diet advancement,  Ab pain has resolved. She is discharged home on two more days of augmentin.   N/V: Per chart review she was hospitalized in 2018 due to nausea and vomiting at the time was thought possibly secondary to gastroparesis although gastric emptying study at the time was normal GI was consulted in 2018 recommended Reglan. She reports has not taking it for a long time she is on IV Reglan in the hospital, N/v has resolved She is discharged on reglan prn  Hypokalemia -replaced, pcp to continue to monitor   Bacterial vaginosis, yeast infection. Patient was treated with IV Diflucanfor two days,  She is onIV Flagyl  Initially, this is changed to oral, she is discharged on two more days of flagyl.   Chest pain; She reports chest pressure. I suspect this is related to frequent vomiting. Was onIV Pepcid, change to ppi Continue with her albuterol in case that chest pain  was related to asthma. Chest pain-free today. No wheezing, no cough  Asthma;  Continue with as needed albuterol. Patient is aware there is available for her. No wheezing, no  cough, no hypoxia Stable.  Obesity: Body mass index is 36.68 kg/m.   Code Status:full  Family Communication:patient and husband   Disposition Plan:Home    Consultants:  none  Procedures:  none  Antibiotics:  As above   Discharge Exam: BP 122/83 (BP Location: Left Arm)   Pulse 72   Temp 98.4 F (36.9 C) (Oral)   Resp 18   Ht 5' (1.524 m)   Wt 85.2 kg   LMP 09/15/2018 Comment: neg hcg today  SpO2 98%   BMI 36.68 kg/m   General: NAD Cardiovascular: RRR Respiratory: CTABL Ab: soft, nontender, + bowel sounds  Discharge Instructions You were cared for by a hospitalist during your hospital stay. If you have any questions about your discharge medications or the care you received while you were in the hospital after you are discharged, you can call the unit and asked to speak with the hospitalist on call if the hospitalist that took care of you is not available. Once you are discharged, your primary care physician will handle any further medical issues. Please note that NO REFILLS for any discharge medications will be authorized once you are discharged, as it is imperative that you return to your primary care physician (or establish a relationship with a primary care physician if you do not have one) for your aftercare needs so that they can reassess your need for medications and monitor your lab values.  Discharge Instructions    Diet general   Complete by:  As directed    Increase activity slowly   Complete by:  As directed      Allergies as of 09/26/2018      Reactions   Lupine Bean Extract Rash   Morphine Rash   Ivp Dye [iodinated Diagnostic Agents] Hives, Itching      Medication List    TAKE these medications   amoxicillin-clavulanate 875-125 MG tablet Commonly known as:  AUGMENTIN Take 1 tablet by mouth every 12 (twelve) hours for 2 days.   metoCLOPramide 10 MG tablet Commonly known as:  REGLAN Take 1 tablet (10 mg total) by mouth every  8 (eight) hours as needed for nausea. What changed:    when to take this  reasons to take this   metroNIDAZOLE 500 MG tablet Commonly known as:  FLAGYL Take 1 tablet (500 mg total) by mouth every 12 (twelve) hours for 2 days.   polyethylene glycol packet Commonly known as:  MIRALAX / GLYCOLAX Take 17 g by mouth daily.      Allergies  Allergen Reactions  . Lupine Bean Extract Rash  . Morphine Rash  . Ivp Dye [Iodinated Diagnostic Agents] Hives and Itching   Follow-up Information    [facility] Follow up in 1 week(s).   Why:  hospital discharge follow up . Contact information: [address] [place] [address] [phone]           The results of significant diagnostics from this hospitalization (including imaging, microbiology, ancillary and laboratory) are listed below for reference.    Significant Diagnostic Studies: Ct Abdomen Pelvis Wo Contrast  Result Date: 09/20/2018 CLINICAL DATA:  Left-sided abdominal pain with vomiting, diarrhea and fever. Symptoms began yesterday. EXAM: CT ABDOMEN AND PELVIS WITHOUT CONTRAST TECHNIQUE: Multidetector CT imaging of the abdomen and  pelvis was performed following the standard protocol without IV contrast. COMPARISON:  09/12/2018 and 05/31/2018 FINDINGS: Lower chest: Subtle dependent bibasilar atelectasis. Hepatobiliary: Previous cholecystectomy. Liver and biliary tree are normal. Pancreas: Normal. Spleen: Normal. Adrenals/Urinary Tract: Adrenal glands are normal. Kidneys are normal in size without hydronephrosis. Punctate nonobstructing stone over the upper pole left kidney. Ureters and bladder are normal. Stomach/Bowel: Stomach and small bowel are normal. Appendix is normal. Mild diverticulosis of the colon. There is an inflamed diverticula over the distal descending colon in the left lower quadrant. There is no perforation or diverticular abscess. Vascular/Lymphatic: Normal.  Reproductive: Uterus and ovaries are unremarkable. Evidence of previous bilateral tubal ligation. Other: None. Musculoskeletal: Normal. IMPRESSION: Evidence of acute diverticulitis involving the distal descending colon in the left lower quadrant. No perforation or abscess. Nonobstructing punctate stone over the upper pole left kidney. Electronically Signed   By: Marin Olp M.D.   On: 09/20/2018 13:18   Ct Abdomen Pelvis Wo Contrast  Result Date: 09/12/2018 CLINICAL DATA:  Abdominal pain EXAM: CT ABDOMEN AND PELVIS WITHOUT CONTRAST TECHNIQUE: Multidetector CT imaging of the abdomen and pelvis was performed following the standard protocol without IV contrast. COMPARISON:  May 31, 2018 FINDINGS: Lower chest: There is bibasilar atelectasis. Lung bases otherwise are clear. Hepatobiliary: There is hepatic steatosis. No focal liver lesions are appreciable on this noncontrast enhanced study. Gallbladder is absent. There is no appreciable biliary duct dilatation. Pancreas: No pancreatic mass or inflammatory focus. Spleen: No splenic lesions are evident. Adrenals/Urinary Tract: Adrenals bilaterally appear normal. Kidneys bilaterally show no evident mass or hydronephrosis on either side. There is no evident renal or ureteral calculus on either side. Urinary bladder is midline with wall thickness within normal limits. Urinary bladder is somewhat distended currently. Stomach/Bowel: There is no appreciable bowel wall or mesenteric thickening. There is no evident bowel obstruction. There is no free air or portal venous air. Vascular/Lymphatic: There is no abdominal aortic aneurysm. No vascular lesions are evident on this noncontrast enhanced study. There is no appreciable adenopathy in the abdomen or pelvis. Reproductive: Uterus is anteverted. No pelvic mass evident. Tubal ligation clips noted. Other: Appendix appears normal. No abscess or ascites evident in the abdomen or pelvis. There is a small ventral hernia  containing only fat. Musculoskeletal: There are no blastic or lytic bone lesions. No intramuscular lesions are evident. IMPRESSION: 1. No evident bowel obstruction. No abscess in the abdomen or pelvis. Appendix appears normal. 2. No appreciable renal or ureteral calculus. No hydronephrosis on either side. Urinary bladder wall is normal in thickness. Note that the urinary bladder is rather distended. 3.  Hepatic steatosis.  No focal liver lesions evident. 4.  Gallbladder absent. 5.  Tubal ligation clips bilaterally. 6.  Small ventral hernia containing only fat. Electronically Signed   By: Lowella Grip III M.D.   On: 09/12/2018 10:09    Microbiology: Recent Results (from the past 240 hour(s))  Wet prep, genital     Status: Abnormal   Collection Time: 09/20/18  6:34 AM  Result Value Ref Range Status   Yeast Wet Prep HPF POC PRESENT (A) NONE SEEN Final   Trich, Wet Prep NONE SEEN NONE SEEN Final   Clue Cells Wet Prep HPF POC PRESENT (A) NONE SEEN Final   WBC, Wet Prep HPF POC MANY (A) NONE SEEN Final   Sperm NONE SEEN  Final    Comment: Performed at [facility], [place] [address] [place], [place] [address]     Labs:  Basic Metabolic Panel: Recent Labs  Lab 09/22/18 0910 09/23/18 0515 09/24/18 0423 09/25/18 0501 09/26/18 0442  NA 137 138 141 141 141  K 3.2* 3.8 3.5 3.4* 3.4*  CL 105 107 108 109 108  CO2 20* 25 26 23 23   GLUCOSE 89 106* 109* 142* 99  BUN 8 9 7 6 13   CREATININE 0.78 0.76 0.79 0.64 0.80  CALCIUM 8.6* 8.4* 8.4* 8.8* 9.1  MG  --   --   --  1.9 1.9   Liver Function Tests: Recent Labs  Lab 09/20/18 0511  AST 28  ALT 36  ALKPHOS 95  BILITOT 0.3  PROT 7.4  ALBUMIN 4.3   Recent Labs  Lab 09/20/18 0511  LIPASE 39   No results for input(s): AMMONIA in the last 168 hours. CBC: Recent Labs  Lab 09/20/18 0511 09/21/18 0252 09/22/18 0910 09/24/18 0423  WBC 9.7 5.6 5.5 4.8  HGB 13.2 12.2 12.4 12.1  HCT 41.0 37.7 38.1 37.0  MCV 89.9 91.5  90.1 92.3  PLT 272 244 235 236   Cardiac Enzymes: Recent Labs  Lab 09/21/18 0252  TROPONINI <0.03   BNP: BNP (last 3 results) No results for input(s): BNP in the last 8760 hours.  ProBNP (last 3 results) No results for input(s): PROBNP in the last 8760 hours.  CBG: No results for input(s): GLUCAP in the last 168 hours.     Signed:  Florencia Reasons MD, PhD  [facility] 09/26/2018, 10:50 AM

## 2018-11-29 ENCOUNTER — Other Ambulatory Visit: Payer: Self-pay

## 2018-11-29 ENCOUNTER — Encounter (HOSPITAL_COMMUNITY): Payer: Self-pay | Admitting: Emergency Medicine

## 2018-11-29 ENCOUNTER — Emergency Department (HOSPITAL_COMMUNITY)
Admission: EM | Admit: 2018-11-29 | Discharge: 2018-11-29 | Disposition: A | Discharge status: Home / Self Care | Payer: Self-pay | Attending: Emergency Medicine | Admitting: Emergency Medicine

## 2018-11-29 ENCOUNTER — Emergency Department (HOSPITAL_COMMUNITY): Payer: Self-pay

## 2018-11-29 ENCOUNTER — Encounter (HOSPITAL_COMMUNITY): Payer: Self-pay

## 2018-11-29 DIAGNOSIS — Z5321 Procedure and treatment not carried out due to patient leaving prior to being seen by health care provider: Secondary | ICD-10-CM | POA: Insufficient documentation

## 2018-11-29 DIAGNOSIS — Z79899 Other long term (current) drug therapy: Secondary | ICD-10-CM | POA: Insufficient documentation

## 2018-11-29 DIAGNOSIS — J101 Influenza due to other identified influenza virus with other respiratory manifestations: Secondary | ICD-10-CM | POA: Insufficient documentation

## 2018-11-29 DIAGNOSIS — R509 Fever, unspecified: Secondary | ICD-10-CM | POA: Insufficient documentation

## 2018-11-29 DIAGNOSIS — R69 Illness, unspecified: Secondary | ICD-10-CM

## 2018-11-29 DIAGNOSIS — J111 Influenza due to unidentified influenza virus with other respiratory manifestations: Secondary | ICD-10-CM

## 2018-11-29 DIAGNOSIS — J45909 Unspecified asthma, uncomplicated: Secondary | ICD-10-CM | POA: Insufficient documentation

## 2018-11-29 LAB — CBC WITH DIFFERENTIAL/PLATELET
Abs Immature Granulocytes: 0.06 10*3/uL (ref 0.00–0.07)
BASOS PCT: 0 %
Basophils Absolute: 0.1 10*3/uL (ref 0.0–0.1)
Eosinophils Absolute: 0.1 10*3/uL (ref 0.0–0.5)
Eosinophils Relative: 1 %
HCT: 40.9 % (ref 36.0–46.0)
Hemoglobin: 13 g/dL (ref 12.0–15.0)
Immature Granulocytes: 0 %
Lymphocytes Relative: 9 %
Lymphs Abs: 1.3 10*3/uL (ref 0.7–4.0)
MCH: 29 pg (ref 26.0–34.0)
MCHC: 31.8 g/dL (ref 30.0–36.0)
MCV: 91.3 fL (ref 80.0–100.0)
Monocytes Absolute: 0.5 10*3/uL (ref 0.1–1.0)
Monocytes Relative: 4 %
Neutro Abs: 12 10*3/uL — ABNORMAL HIGH (ref 1.7–7.7)
Neutrophils Relative %: 86 %
PLATELETS: 270 10*3/uL (ref 150–400)
RBC: 4.48 MIL/uL (ref 3.87–5.11)
RDW: 12.4 % (ref 11.5–15.5)
WBC: 14 10*3/uL — ABNORMAL HIGH (ref 4.0–10.5)
nRBC: 0 % (ref 0.0–0.2)

## 2018-11-29 LAB — RESPIRATORY PANEL BY PCR
Adenovirus: NOT DETECTED
Bordetella pertussis: NOT DETECTED
CORONAVIRUS OC43-RVPPCR: NOT DETECTED
Chlamydophila pneumoniae: NOT DETECTED
Coronavirus 229E: NOT DETECTED
Coronavirus HKU1: NOT DETECTED
Coronavirus NL63: NOT DETECTED
Influenza A: NOT DETECTED
Influenza B: NOT DETECTED
Metapneumovirus: NOT DETECTED
Mycoplasma pneumoniae: NOT DETECTED
PARAINFLUENZA VIRUS 2-RVPPCR: NOT DETECTED
PARAINFLUENZA VIRUS 3-RVPPCR: NOT DETECTED
Parainfluenza Virus 1: NOT DETECTED
Parainfluenza Virus 4: NOT DETECTED
RHINOVIRUS / ENTEROVIRUS - RVPPCR: NOT DETECTED
Respiratory Syncytial Virus: NOT DETECTED

## 2018-11-29 LAB — LIPASE, BLOOD: LIPASE: 27 U/L (ref 11–51)

## 2018-11-29 LAB — LACTIC ACID, PLASMA: Lactic Acid, Venous: 1.9 mmol/L (ref 0.5–1.9)

## 2018-11-29 LAB — COMPREHENSIVE METABOLIC PANEL
ALBUMIN: 4.1 g/dL (ref 3.5–5.0)
ALT: 34 U/L (ref 0–44)
AST: 31 U/L (ref 15–41)
Alkaline Phosphatase: 109 U/L (ref 38–126)
Anion gap: 9 (ref 5–15)
BUN: 16 mg/dL (ref 6–20)
CO2: 21 mmol/L — ABNORMAL LOW (ref 22–32)
Calcium: 9 mg/dL (ref 8.9–10.3)
Chloride: 108 mmol/L (ref 98–111)
Creatinine, Ser: 0.85 mg/dL (ref 0.44–1.00)
GFR calc Af Amer: >60 mL/min (ref >60)
GFR calc non Af Amer: >60 mL/min (ref >60)
Glucose, Bld: 123 mg/dL — ABNORMAL HIGH (ref 70–99)
Potassium: 3.5 mmol/L (ref 3.5–5.1)
Sodium: 138 mmol/L (ref 135–145)
Total Bilirubin: 0.5 mg/dL (ref 0.3–1.2)
Total Protein: 7.4 g/dL (ref 6.5–8.1)

## 2018-11-29 LAB — URINALYSIS, ROUTINE W REFLEX MICROSCOPIC
Bilirubin Urine: NEGATIVE
Glucose, UA: NEGATIVE mg/dL
HGB URINE DIPSTICK: NEGATIVE
Ketones, ur: NEGATIVE mg/dL
Leukocytes,Ua: NEGATIVE
Nitrite: NEGATIVE
PROTEIN: NEGATIVE mg/dL
Specific Gravity, Urine: 1.017 (ref 1.005–1.030)
pH: 7 (ref 5.0–8.0)

## 2018-11-29 LAB — INFLUENZA PANEL BY PCR (TYPE A & B)
Influenza A By PCR: NEGATIVE
Influenza B By PCR: NEGATIVE

## 2018-11-29 LAB — I-STAT BETA HCG BLOOD, ED (MC, WL, AP ONLY): I-stat hCG, quantitative: <5 m[IU]/mL (ref <5)

## 2018-11-29 MED ORDER — IBUPROFEN 800 MG PO TABS
800.0000 mg | ORAL_TABLET | Freq: Once | ORAL | Status: AC
  Administered 2018-11-29: 800 mg via ORAL
  Filled 2018-11-29: qty 1

## 2018-11-29 MED ORDER — ACETAMINOPHEN 325 MG PO TABS: 650.0000 mg | ORAL_TABLET | Freq: Once | ORAL | Status: DC | PRN

## 2018-11-29 MED ORDER — IBUPROFEN 400 MG PO TABS: 600.0000 mg | ORAL_TABLET | Freq: Once | ORAL | Status: DC

## 2018-11-29 MED ORDER — METOCLOPRAMIDE HCL 10 MG PO TABS: 10.0000 mg | ORAL_TABLET | Freq: Three times a day (TID) | ORAL | 0 refills | Status: DC | PRN

## 2018-11-29 MED ORDER — ACETAMINOPHEN 325 MG PO TABS
650.0000 mg | ORAL_TABLET | Freq: Once | ORAL | Status: AC
  Administered 2018-11-29: 650 mg via ORAL
  Filled 2018-11-29: qty 2

## 2018-11-29 MED ORDER — SODIUM CHLORIDE 0.9 % IV BOLUS
1000.0000 mL | Freq: Once | INTRAVENOUS | Status: AC
  Administered 2018-11-29: 1000 mL via INTRAVENOUS

## 2018-11-29 MED ORDER — SODIUM CHLORIDE 0.9 % IV BOLUS: 1000.0000 mL | Freq: Once | INTRAVENOUS | Status: DC

## 2018-11-29 NOTE — ED Triage Notes (Signed)
Pt c/o fever, cough, chills x 1 day. Pt seen this am for same.

## 2018-11-29 NOTE — Discharge Instructions (Addendum)
You being tracked by the health [place] for disease control for potential infection with the novel coronavirus. Please understand that you will be under quarantine until you are contacted by the health department.  The test usually takes about 3 to 4 days to turn around and you should be contacted at that point to tell you if it is positive or negative. Please follow the instructions for home isolation in the package you are being handed.  Follow these instructions at home: Take over-the-counter and prescription medicines only as told by your health care provider. Use a cool mist humidifier to add humidity to the air in your home. This can make breathing easier. Rest as needed. Drink enough fluid to keep your urine clear or pale yellow. Cover your mouth and nose when you cough or sneeze. Wash your hands with soap and water often, especially after you cough or sneeze. If soap and water are not available, use hand sanitizer. Stay home from work or school as told by your health care provider. Unless you are visiting your health care provider, try to avoid leaving home until your fever has been gone for 24 hours without the use of medicine. Keep all follow-up visits as told by your health care provider. This is important. Contact a health care provider if: You develop new symptoms. You have: Chest pain. Diarrhea. A fever. Your cough gets worse. You produce more mucus. You feel nauseous or you vomit. Get help right away if: You develop shortness of breath or difficulty breathing. Your skin or nails turn a bluish color. You have severe pain or stiffness in your neck. You develop a sudden headache or sudden pain in your face or ear. You cannot stop vomiting.

## 2018-11-29 NOTE — ED Provider Notes (Signed)
6:19 AM BP (!) 136/95 (BP Location: Left Arm)   Pulse (!) 135   Temp (!) 101.9 F (38.8 C) (Oral)   Resp 17   Ht 5' (1.524 m)   LMP 11/16/2018   SpO2 98%   BMI 36.68 kg/m  Patient here with flu like sxs. If influenza negative-  RVP + covid +- ct but has had many  Chronic abd pain C/o RLQ abd   33 year old Hispanic female who is Spanish-speaking.  There is a language barrier.  After return of the patient's influenza test I spoke with Dr. Karolee Ohs about proceeding.  She recommended RVP prior to COVID-19 Testing.  Patient's abdominal pain has improved with cessation of her fever.    Patient RVP has returned negative.  I again spoke with Dr. Megan Salon of infectious disease who recommended given the patient's initial presentation with significant tachycardia, tachypnea, and fever without overt signs or symptoms of pneumonia or sepsis we proceed with novel coronavirus testing. This involved significant system navigation for new process including time taken out to send paperwork to the [facility] for following.  I then translated the current outpatient home sequestration/quarantine paperwork from the Department of Health and Human Services into Spanish into Spanish to ensure the patient had a firm understanding of the instructions.  I also went over the instructions with the translator line by line.  Patient seemed to understand that there will be a 3 to 4-day window of turnaround for her testing.  She is not to leave her home unless she is going for medical care and should instruct any party prior to arrival that she is currently under quarantine also instructed any first responders should she need them.  Patient has a clear understanding and knows reasons to return both for her respiratory illness or should she have any increased return of her abdominal pain.  Repeat examination shows benign abdominal examination.  The patient appears appropriate for discharge at this time under self containment.   She is hemodynamically stable and her vital signs have improved significantly throughout her stay here in the ER      Margarita Mail, PA-C 11/29/18 Dolores Patty, MD 11/30/18 [phone]

## 2018-11-29 NOTE — ED Notes (Signed)
Attempted IV access x 2 without success.

## 2018-11-29 NOTE — ED Triage Notes (Signed)
Patient reports fever, cough, chills, and sore throat since last night. Denies chest pain, SOB, abdominal pain, or dysuria. Reports taking Tylenol 4 hours ago.

## 2018-11-29 NOTE — ED Notes (Signed)
RN Almyra Free to draw blood

## 2018-11-29 NOTE — ED Notes (Signed)
Patient verbalizes understanding of discharge instructions. Opportunity for questioning and answers were provided. Pt discharged from ED. 

## 2018-11-29 NOTE — ED Notes (Signed)
Called pt name in lobby x 3. No response

## 2018-11-29 NOTE — ED Notes (Signed)
This RN sent down add on resp panel to lab.

## 2018-11-29 NOTE — ED Provider Notes (Signed)
[facility] EMERGENCY DEPARTMENT Provider Note   CSN: [id number] Arrival date & time: 11/29/18  0354    History   Chief Complaint Chief Complaint  Patient presents with  . Fever  . Cough    HPI Lorraine [name] is a 33 y.o. female with a past medical history of diverticulitis, pancreatitis, prediabetes, obesity, who presents today for evaluation of generally not feeling well.  She reports that about 8 hours prior to arrival at 8 PM she started feeling poorly.  She says that her entire body started hurting, she developed chills, generalized weakness, and cough.  She reports abdominal pain in the right lower quadrant.  She denies any urinary symptoms.  She reports nausea with one episode of vomiting, and diarrhea.  She denies any recent trauma.  She did not get a flu shot this year.  She denies any recent travel, does not have exposure to known Covid patient.  She denies shortness of breath.      HPI  Past Medical History:  Diagnosis Date  . Angio-edema   . Asthma    Inhaler used 01/02/16  . Diverticulitis   . Nausea & vomiting 04/02/2017  . Pancreatitis   . Pre-diabetes   . Pyelonephritis   . Urticaria     Patient Active Problem List   Diagnosis Date Noted  . Intractable vomiting   . Acute diverticulitis 09/20/2018  . Cervical radiculopathy   . Left arm weakness   . Left-sided weakness 02/19/2018  . Weakness 02/19/2018  . Rash 02/09/2018  . Chest pain 02/08/2018  . Acute pancreatitis 04/02/2017  . Abdominal pain, acute, left upper quadrant 04/02/2017  . Intractable nausea and vomiting 04/02/2017  . Asthma in adult 04/02/2017  . Obesity (BMI 30.0-34.9) 04/02/2017  . Fatty infiltration of liver 04/02/2017  . Gastroesophageal reflux disease without esophagitis   . Pre-diabetes 07/15/2015  . Diarrhea   . Hypokalemia   . Bacterial vaginosis   . Pyelonephritis 07/07/2015  . Abdominal pain, right upper quadrant   . Asthma 10/31/2014  . History of  preterm delivery 10/22/2014  . Status post repeat low transverse cesarean section 09/06/2014  . Diverticulitis 09/10/2012    Past Surgical History:  Procedure Laterality Date  . CESAREAN SECTION    . CESAREAN SECTION N/A 2006  . CESAREAN SECTION N/A 09/03/2014   Procedure: CESAREAN SECTION;  Surgeon: Guss Bunde, MD;  Location: [facility];  Service: Obstetrics;  Laterality: N/A;  . CHOLECYSTECTOMY    . ESOPHAGOGASTRODUODENOSCOPY (EGD) WITH PROPOFOL Left 04/07/2017   Procedure: ESOPHAGOGASTRODUODENOSCOPY (EGD) WITH PROPOFOL;  Surgeon: Ronnette Juniper, MD;  Location: [facility];  Service: Gastroenterology;  Laterality: Left;  . TUBAL LIGATION       OB History    Gravida  5   Para  3   Term  1   Preterm  2   AB  2   Living  3     SAB  2   TAB      Ectopic      Multiple  1   Live Births  3            Home Medications    Prior to Admission medications   Medication Sig Start Date End Date Taking? Authorizing Provider  metoCLOPramide (REGLAN) 10 MG tablet Take 1 tablet (10 mg total) by mouth every 8 (eight) hours as needed for nausea. 09/26/18   Florencia Reasons, MD  polyethylene glycol [facility] / Floria Raveling) packet Take 17 g by mouth  daily. 09/26/18   Florencia Reasons, MD    Family History Family History  Problem Relation Age of Onset  . Hyperlipidemia Mother   . Diabetes Father   . Ulcers Father   . Diabetes Maternal Uncle   . Diabetes Paternal Grandmother   . Asthma Daughter   . Allergic rhinitis Neg Hx   . Angioedema Neg Hx   . Eczema Neg Hx   . Immunodeficiency Neg Hx   . Urticaria Neg Hx     Social History Social History   Tobacco Use  . Smoking status: Never Smoker  . Smokeless tobacco: Never Used  Substance Use Topics  . Alcohol use: No  . Drug use: No     Allergies   Lupine bean extract; Morphine; and Ivp dye [iodinated diagnostic agents]   Review of Systems Review of Systems  Constitutional: Positive for chills, fatigue and fever.  HENT: Negative  for congestion and sore throat.   Respiratory: Positive for cough. Negative for chest tightness and shortness of breath.   Cardiovascular: Negative for chest pain.  Gastrointestinal: Positive for abdominal pain, diarrhea, nausea and vomiting.  Genitourinary: Negative for dysuria, flank pain, frequency, hematuria, pelvic pain, vaginal bleeding, vaginal discharge and vaginal pain.  Musculoskeletal: Positive for arthralgias and myalgias.  Neurological: Positive for weakness. Negative for light-headedness and headaches.  All other systems reviewed and are negative.    Physical Exam Updated Vital Signs BP (!) 136/95 (BP Location: Left Arm)   Pulse (!) 135   Temp (!) 101.9 F (38.8 C) (Oral)   Resp 17   Ht 5' (1.524 m)   LMP 11/16/2018   SpO2 98%   BMI 36.68 kg/m   Physical Exam Vitals signs and nursing note reviewed.  Constitutional:      General: She is not in acute distress.    Appearance: She is well-developed.  HENT:     Head: Normocephalic and atraumatic.  Eyes:     Conjunctiva/sclera: Conjunctivae normal.  Neck:     Musculoskeletal: Normal range of motion and neck supple. No neck rigidity.  Cardiovascular:     Rate and Rhythm: Regular rhythm. Tachycardia present.     Pulses: Normal pulses.     Heart sounds: Normal heart sounds. No murmur.  Pulmonary:     Effort: Pulmonary effort is normal. No respiratory distress.     Breath sounds: Normal breath sounds.  Abdominal:     General: Abdomen is flat. Bowel sounds are normal. There is no distension.     Palpations: Abdomen is soft.     Tenderness: There is abdominal tenderness in the right lower quadrant. There is no guarding or rebound.     Hernia: No hernia is present.  Lymphadenopathy:     Cervical: No cervical adenopathy.  Skin:    General: Skin is warm and dry.  Neurological:     Mental Status: She is alert.      ED Treatments / Results  Labs (all labs ordered are listed, but only abnormal results are  displayed) Labs Reviewed  COMPREHENSIVE METABOLIC PANEL - Abnormal; Notable for the following components:      Result Value   CO2 21 (*)    Glucose, Bld 123 (*)    All other components within normal limits  CBC WITH DIFFERENTIAL/PLATELET - Abnormal; Notable for the following components:   WBC 14.0 (*)    Neutro Abs 12.0 (*)    All other components within normal limits  LIPASE, BLOOD  LACTIC ACID, PLASMA  URINALYSIS, ROUTINE W REFLEX MICROSCOPIC  INFLUENZA PANEL BY PCR (TYPE A & B)  LACTIC ACID, PLASMA  I-STAT BETA HCG BLOOD, ED ([facility], [facility], [facility] ONLY)    EKG None  Radiology No results found.  Procedures Procedures (including critical care time)  Medications Ordered in ED Medications  ibuprofen (ADVIL,MOTRIN) tablet 600 mg (has no administration in time range)  sodium chloride 0.9 % bolus 1,000 mL (1,000 mLs Intravenous New Bag/Given 11/29/18 0545)  acetaminophen (TYLENOL) tablet 650 mg (650 mg Oral Given 11/29/18 0527)     Initial Impression / Assessment and Plan / ED Course  I have reviewed the triage vital signs and the nursing notes.  Pertinent labs & imaging results that were available during my care of the patient were reviewed by me and considered in my medical decision making (see chart for details).       Lorraine [name] presents today for evaluation of fever, cough, and generally not feeling well for approximately 8 hours.  On initial evaluation she is tachycardic with heart rates in the 130s to 140s, and febrile with a temperature of 101.9.  She is placed on droplet precautions.  She does not have any known exposure to to anyone with Covid 19.  She has abdominal pain, however chart review shows chronic abdominal pain.  Labs were obtained, white count is slightly elevated at 14 with neutrophils of 12.0, she does not have any other significant electrolyte or hematologic derangements.  Lipase is not elevated.  Lactic acid is not elevated.  Rapid flu sent.  She is treated  with IV fluids, Tylenol, and ibuprofen.  Suspect influenza.    At shift change care was transferred to Doreen Salvage PA-C who will follow pending studies, re-evaulate and determine disposition.     Final Clinical Impressions(s) / ED Diagnoses   Final diagnoses:  Fever, unspecified fever cause    ED Discharge Orders    None       Ollen Gross 11/29/18 0086    Ripley Fraise, MD 11/29/18 [phone]

## 2018-12-01 ENCOUNTER — Emergency Department (HOSPITAL_COMMUNITY)
Admission: EM | Admit: 2018-12-01 | Discharge: 2018-12-02 | Disposition: A | Discharge status: Home / Self Care | Payer: Self-pay | Attending: Emergency Medicine | Admitting: Emergency Medicine

## 2018-12-01 ENCOUNTER — Other Ambulatory Visit: Payer: Self-pay

## 2018-12-01 DIAGNOSIS — Z79899 Other long term (current) drug therapy: Secondary | ICD-10-CM | POA: Insufficient documentation

## 2018-12-01 DIAGNOSIS — B002 Herpesviral gingivostomatitis and pharyngotonsillitis: Secondary | ICD-10-CM | POA: Insufficient documentation

## 2018-12-01 DIAGNOSIS — J45909 Unspecified asthma, uncomplicated: Secondary | ICD-10-CM | POA: Insufficient documentation

## 2018-12-01 MED ORDER — GENERIC EXTERNAL MEDICATION
10.00 | Status: DC
Start: ? — End: 2018-12-01

## 2018-12-01 MED ORDER — LIDOCAINE VISCOUS HCL 2 % MT SOLN
15.0000 mL | Freq: Once | OROMUCOSAL | Status: AC
  Administered 2018-12-01: 15 mL via OROMUCOSAL
  Filled 2018-12-01: qty 15

## 2018-12-01 MED ORDER — SODIUM CHLORIDE 0.9 % IV SOLN
10.00 | INTRAVENOUS | Status: DC
Start: ? — End: 2018-12-01

## 2018-12-01 NOTE — ED Triage Notes (Signed)
Pt seen 11/29/18 for same. Pt was tested for COVID which has not resulted.

## 2018-12-01 NOTE — ED Provider Notes (Signed)
[facility] EMERGENCY DEPARTMENT Provider Note   CSN: [id number] Arrival date & time: 12/01/18  2032    History   Chief Complaint Chief Complaint  Patient presents with   Sore Throat    HPI Lorraine [name] is a 33 y.o. female with a history of diverticulitis, pyelonephritis, prediabetes, pancreatitis, asthma, and angioedema who presents to the emergency department with a chief complaint of sore throat.  She reports constant, worsening sore throat and sores to the mouth and lips over the last 1 to 2 days.  She reports that she has been able to drink fluids at home, but it is painful.  She reports that she has voided 1-2 times in the last 24 hours.  She also reports that she has had a fever for the last 3 days.  She reports that she has not taken any antipyretics in 24 hours.  She reports that she is also had body aches and chills over the last few days with a sore throat, non-bloody diarrhea x3, cough, and nasal congestion for the last 2 days.  Of note, the patient was seen in the emergency department on March 14 and was tested for COVID-19, which has not yet resolved.  She was seen at [facility] and was then discharged and returned, but left without being seen and then presented to [facility] ER all on March 24.  She has had no episodes of vomiting or diarrhea in the last 24 hours, trismus, shortness of breath, chest pain, abdominal pain, rash to the hands or feet, neck stiffness.  No known sick contacts.  He is sexually active with one female partner.     The history is provided by the patient. The history is limited by a language barrier. A language interpreter was used ([place]).    Past Medical History:  Diagnosis Date   Angio-edema    Asthma    Inhaler used 01/02/16   Diverticulitis    Nausea & vomiting 04/02/2017   Pancreatitis    Pre-diabetes    Pyelonephritis    Urticaria     Patient Active Problem List   Diagnosis Date Noted    Intractable vomiting    Acute diverticulitis 09/20/2018   Cervical radiculopathy    Left arm weakness    Left-sided weakness 02/19/2018   Weakness 02/19/2018   Rash 02/09/2018   Chest pain 02/08/2018   Acute pancreatitis 04/02/2017   Abdominal pain, acute, left upper quadrant 04/02/2017   Intractable nausea and vomiting 04/02/2017   Asthma in adult 04/02/2017   Obesity (BMI 30.0-34.9) 04/02/2017   Fatty infiltration of liver 04/02/2017   Gastroesophageal reflux disease without esophagitis    Pre-diabetes 07/15/2015   Diarrhea    Hypokalemia    Bacterial vaginosis    Pyelonephritis 07/07/2015   Abdominal pain, right upper quadrant    Asthma 10/31/2014   History of preterm delivery 10/22/2014   Status post repeat low transverse cesarean section 09/06/2014   Diverticulitis 09/10/2012    Past Surgical History:  Procedure Laterality Date   CESAREAN SECTION     CESAREAN SECTION N/A 2006   CESAREAN SECTION N/A 09/03/2014   Procedure: CESAREAN SECTION;  Surgeon: Guss Bunde, MD;  Location: [facility];  Service: Obstetrics;  Laterality: N/A;   CHOLECYSTECTOMY     ESOPHAGOGASTRODUODENOSCOPY (EGD) WITH PROPOFOL Left 04/07/2017   Procedure: ESOPHAGOGASTRODUODENOSCOPY (EGD) WITH PROPOFOL;  Surgeon: Ronnette Juniper, MD;  Location: [place];  Service: Gastroenterology;  Laterality: Left;   TUBAL LIGATION  OB History    Gravida  5   Para  3   Term  1   Preterm  2   AB  2   Living  3     SAB  2   TAB      Ectopic      Multiple  1   Live Births  3            Home Medications    Prior to Admission medications   Medication Sig Start Date End Date Taking? Authorizing Provider  acyclovir (ZOVIRAX) 400 MG tablet Take 1 tablet (400 mg total) by mouth 3 (three) times daily for 7 days. 12/02/18 12/09/18  Jaynell Castagnola A, PA-C  lidocaine (XYLOCAINE) 2 % solution Use as directed 15 mLs in the mouth or throat every 3 (three) hours as needed  for mouth pain. 12/02/18   Genelle Economou A, PA-C  metoCLOPramide (REGLAN) 10 MG tablet Take 1 tablet (10 mg total) by mouth 3 (three) times daily as needed for nausea (headache / nausea). 11/29/18   Harris, Abigail, PA-C  polyethylene glycol (MIRALAX / GLYCOLAX) packet Take 17 g by mouth daily. 09/26/18   Florencia Reasons, MD    Family History Family History  Problem Relation Age of Onset   Hyperlipidemia Mother    Diabetes Father    Ulcers Father    Diabetes Maternal Uncle    Diabetes Paternal Grandmother    Asthma Daughter    Allergic rhinitis Neg Hx    Angioedema Neg Hx    Eczema Neg Hx    Immunodeficiency Neg Hx    Urticaria Neg Hx     Social History Social History   Tobacco Use   Smoking status: Never Smoker   Smokeless tobacco: Never Used  Substance Use Topics   Alcohol use: No   Drug use: No     Allergies   Lupine bean extract; Morphine; and Ivp dye [iodinated diagnostic agents]   Review of Systems Review of Systems  Constitutional: Positive for chills and fever. Negative for activity change.  HENT: Positive for sore throat and trouble swallowing. Negative for ear pain, facial swelling and voice change.   Respiratory: Positive for cough. Negative for shortness of breath and stridor.   Cardiovascular: Negative for chest pain.  Gastrointestinal: Negative for abdominal pain, diarrhea, nausea and vomiting.  Genitourinary: Negative for dysuria.  Musculoskeletal: Negative for back pain, myalgias, neck pain and neck stiffness.  Skin: Positive for rash. Negative for color change, pallor and wound.  Allergic/Immunologic: Negative for immunocompromised state.  Neurological: Negative for tremors, weakness and headaches.  Psychiatric/Behavioral: Negative for confusion.     Physical Exam Updated Vital Signs BP 138/72 (BP Location: Right Arm)    Pulse 78    Temp 98.3 F (36.8 C) (Oral)    Resp 18    LMP 11/16/2018    SpO2 98%   Physical Exam Vitals signs and  nursing note reviewed.  Constitutional:      General: She is not in acute distress.    Appearance: She is not ill-appearing or toxic-appearing.     Comments: NAD.   HENT:     Head: Normocephalic.     Right Ear: Hearing, ear canal and external ear normal. A middle ear effusion is present.     Left Ear: Hearing and external ear normal. A middle ear effusion is present.     Nose: Nose normal.     Mouth/Throat:     Mouth: Mucous membranes are  moist.     Comments: Vescicular rash noted to the lower lip and posterior oropharynx.  No lesions noted on the gingiva, tongue, or the hard or soft palate. Eyes:     Extraocular Movements: Extraocular movements intact.     Conjunctiva/sclera: Conjunctivae normal.     Pupils: Pupils are equal, round, and reactive to light.  Neck:     Musculoskeletal: Neck supple.  Cardiovascular:     Rate and Rhythm: Normal rate and regular rhythm.     Heart sounds: No murmur. No friction rub. No gallop.   Pulmonary:     Effort: Pulmonary effort is normal. No respiratory distress.     Breath sounds: No stridor. No wheezing, rhonchi or rales.  Chest:     Chest wall: No tenderness.  Abdominal:     General: There is no distension.     Palpations: Abdomen is soft. There is no mass.     Tenderness: There is no abdominal tenderness. There is no right CVA tenderness, left CVA tenderness, guarding or rebound.     Hernia: No hernia is present.  Skin:    General: Skin is warm.     Capillary Refill: Capillary refill takes less than 2 seconds.     Findings: No rash.     Comments: No rashes noted to the hands or feet.  Neurological:     Mental Status: She is alert.  Psychiatric:        Behavior: Behavior normal.      ED Treatments / Results  Labs (all labs ordered are listed, but only abnormal results are displayed) Labs Reviewed  HSV CULTURE AND TYPING  HSV 1 ANTIBODY, IGG  HSV 2 ANTIBODY, IGG    EKG None  Radiology No results  found.  Procedures Procedures (including critical care time)  Medications Ordered in ED Medications  lidocaine (XYLOCAINE) 2 % viscous mouth solution 15 mL (15 mLs Mouth/Throat Given 12/01/18 2358)     Initial Impression / Assessment and Plan / ED Course  I have reviewed the triage vital signs and the nursing notes.  Pertinent labs & imaging results that were available during my care of the patient were reviewed by me and considered in my medical decision making (see chart for details).        33 year old female with a history of diverticulitis, pyelonephritis, prediabetes, pancreatitis, asthma, and angioedema presenting with sore throat and lesions to the mouth after several days of fever, chills, body aches, sore throat, and nasal congestion.  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  This is the patient's fourth visit to the ER in the last 48 hours.  She presented with a fever and a cough 2 days prior and was tested for [facility] at 19, which is still pending.  The patient is supposed to be under home quarantine.  She developed the ulcerations to the mouth over the last 24 hours.  She has been able to drink at home, but is painful.  She has been voiding.  On my exam, she does not look dehydrated.  She was able to tolerate oral fluids in the ER.  She was given viscous lidocaine for pain control.  Given successful fluid challenge in the ER, I do not feel that she indicates admission for hepatic gingivostomatitis at this time.  HSV test are pending.  I suspect the patient's fever and viral symptoms from 2 days ago were secondary to an initial outbreak of HSV, but given that [place] is  pending, I have instructed the patient to remain under home quarantine until the test results in the next 48 hours.  However, given herpetic gingivostomatitis on exam, I will initiate the patient on acyclovir and discharge her with symptomatic treatment.  She was given strict precautions on how to avoid  spread of infection to other members of her household.  Return precautions given.  She is hemodynamically stable and in no acute distress.  Safe for discharge home with outpatient follow-up at this time.  Final Clinical Impressions(s) / ED Diagnoses   Final diagnoses:  Herpetic gingivostomatitis    ED Discharge Orders         Ordered    acyclovir (ZOVIRAX) 400 MG tablet  3 times daily     12/02/18 0056    lidocaine (XYLOCAINE) 2 % solution  Every  3 hours PRN     12/02/18 0056           Hailey Miles, Maree Erie A, PA-C 12/02/18 0945    Dorie Rank, MD 12/02/18 1539

## 2018-12-02 LAB — NOVEL CORONAVIRUS, NAA (HOSP ORDER, SEND-OUT TO REF LAB; TAT 18-24 HRS): SARS-CoV-2, NAA: NOT DETECTED

## 2018-12-02 LAB — NOVEL CORONAVIRUS, NAA (HOSPITAL ORDER, SEND-OUT TO REF LAB)

## 2018-12-02 MED ORDER — ACYCLOVIR 400 MG PO TABS: 400.0000 mg | ORAL_TABLET | Freq: Three times a day (TID) | ORAL | 0 refills | Status: AC

## 2018-12-02 MED ORDER — LIDOCAINE VISCOUS HCL 2 % MT SOLN: 15.0000 mL | OROMUCOSAL | 0 refills | Status: DC | PRN

## 2018-12-02 NOTE — Discharge Instructions (Addendum)
Gracias por permitirme cuidardelo hoy en el Departamento de Emergencias.   La erupcin cutnea en la boca es preocupante para un brote inicial de herpes oral.  He ordenado un anlisis de sangre para esto, Armed forces training and education officer puede tomar varios das para volver.  Mientras tanto, puede tratar sus sntomas tomando 1 comprimido de aciclovir cada 8 horas durante la semana siguiente.  Para ayudar con el dolor, puede tomar 650 mg de Tylenol una vez cada 6 horas o 600 mg de ibuprofeno con alimentos una vez cada 6 horas para ayudar con Conservation officer, historic buildings y Barista.  Puede tragar 15 ml de lidocana viscosa cada 3 horas segn sea necesario para el dolor.  Es importante que contine bebiendo lquidos para Product/process development scientist deshidratarse hasta que las hetes desaparezcan.  No debe besar, comer o beber, ni ponerse en contacto directamente con otras personas o familiares con la boca hasta que las llagas se hayan ido porque esto puede ser contagioso.  Se adjunta informacin adicional sobre el herpes.  Adems, te realizaron pruebas para detectar el virus corona hace 2 das.  Esta prueba an no ha resultado.  Es probable que la fiebre se deba a un brote inicial de herpes, pero debido a que ambas pruebas estn pendientes, es extremadamente importante que se ponga en cuarentena en casa hasta los [place] de la prueba.  Usted debe regresar al servicio de urgencias si no puede tragar lquidos, si desarrolla dificultad para respirar grave, siente como si su garganta se est cerrando, o si desarrolla otros nuevos, con respecto a los sntomas.  Thank you for allowing me to care for you today in the Emergency Department.   The rash on your mouth is concerning for an initial outbreak of oral herpes.  I have ordered a blood test for this, but it can take several days to come back.  In the meantime, you can treat your symptoms by taking 1 tablet of acyclovir every 8 hours for the next week.  To help with pain, you can take 650 mg of Tylenol once every 6  hours or 600 mg of ibuprofen with food once every 6 hours to help with pain and fever.  You can swallow 15 mL of viscous lidocaine every 3 hours as needed for pain.  It is important that you continue to drink fluids to avoid getting dehydrated until the sores go away.  You should not kiss, eat or drink, or directly contact other people or family members with your mouth until the sores have gone away because this can be contagious.  Additional information about herpes is attached.  Also, you were tested for corona virus 2 days ago.  This test has not resulted yet.  It is likely that your fever was due to an initial outbreak of herpes, but because both of these test are pending, IT IS EXTREMELY IMPORTANT that you self quarantine at home until your test results.  You should return to the emergency department if you become unable to swallow any liquids, if you develop severe shortness of breath, feel as if your throat is closing, or if you develop other new, concerning symptoms.

## 2018-12-03 LAB — HSV 1 ANTIBODY, IGG: HSV 1 Glycoprotein G Ab, IgG: 61.1 index — ABNORMAL HIGH (ref 0.00–0.90)

## 2018-12-03 LAB — HSV 2 ANTIBODY, IGG: HSV 2 Glycoprotein G Ab, IgG: <0.91 index (ref 0.00–0.90)

## 2018-12-08 ENCOUNTER — Encounter: Payer: Self-pay | Admitting: Physician Assistant

## 2018-12-08 NOTE — Telephone Encounter (Signed)
Please respond to patient and provide the correct advice.

## 2019-04-19 ENCOUNTER — Ambulatory Visit (HOSPITAL_COMMUNITY)
Admission: EM | Admit: 2019-04-19 | Discharge: 2019-04-19 | Disposition: A | Discharge status: Home / Self Care | Payer: Self-pay | Attending: Urgent Care | Admitting: Urgent Care

## 2019-04-19 ENCOUNTER — Other Ambulatory Visit: Payer: Self-pay

## 2019-04-19 DIAGNOSIS — M545 Low back pain, unspecified: Secondary | ICD-10-CM

## 2019-04-19 DIAGNOSIS — S39012A Strain of muscle, fascia and tendon of lower back, initial encounter: Secondary | ICD-10-CM

## 2019-04-19 MED ORDER — TIZANIDINE HCL 2 MG PO CAPS: 2.0000 mg | ORAL_CAPSULE | Freq: Three times a day (TID) | ORAL | 0 refills | Status: DC | PRN

## 2019-04-19 MED ORDER — PREDNISONE 20 MG PO TABS: ORAL_TABLET | ORAL | 0 refills | Status: DC

## 2019-04-19 NOTE — ED Triage Notes (Signed)
Low back pain Lorraine [name], [place] at bedside/assist with triage

## 2019-04-19 NOTE — ED Provider Notes (Addendum)
MRN: [id number] DOB: Feb 09, 1986  Subjective:   Lorraine [name] is a 33 y.o. female presenting for 3-day history of recurrent low back pain that is sharp, achy and throbbing in nature.  Patient symptoms started when she suffered a fall from slipping on water in May.  She was seen at an urgent care clinic, had negative imaging.  She was put on ibuprofen and Flexeril and did well while she was on them but pain and symptoms returned when she was off and has been intermittent since then.  Admits that she does not hydrate well with water, generally does not drink it unless she feels thirsty.  No current facility-administered medications for this encounter.   Current Outpatient Medications:  .  lidocaine (XYLOCAINE) 2 % solution, Use as directed 15 mLs in the mouth or throat every 3 (three) hours as needed for mouth pain., Disp: 100 mL, Rfl: 0 .  metoCLOPramide (REGLAN) 10 MG tablet, Take 1 tablet (10 mg total) by mouth 3 (three) times daily as needed for nausea (headache / nausea)., Disp: 20 tablet, Rfl: 0 .  polyethylene glycol (MIRALAX / GLYCOLAX) packet, Take 17 g by mouth daily., Disp: 14 each, Rfl: 0   Allergies  Allergen Reactions  . Lupine Bean Extract Rash  . Morphine Rash  . Ivp Dye [Iodinated Diagnostic Agents] Hives and Itching    Past Medical History:  Diagnosis Date  . Angio-edema   . Asthma    Inhaler used 01/02/16  . Diverticulitis   . Nausea & vomiting 04/02/2017  . Pancreatitis   . Pre-diabetes   . Pyelonephritis   . Urticaria      Past Surgical History:  Procedure Laterality Date  . CESAREAN SECTION    . CESAREAN SECTION N/A 2006  . CESAREAN SECTION N/A 09/03/2014   Procedure: CESAREAN SECTION;  Surgeon: Guss Bunde, MD;  Location: [facility];  Service: Obstetrics;  Laterality: N/A;  . CHOLECYSTECTOMY    . ESOPHAGOGASTRODUODENOSCOPY (EGD) WITH PROPOFOL Left 04/07/2017   Procedure: ESOPHAGOGASTRODUODENOSCOPY (EGD) WITH PROPOFOL;  Surgeon: Ronnette Juniper, MD;   Location: [facility];  Service: Gastroenterology;  Laterality: Left;  . TUBAL LIGATION      ROS Denies fever, nausea, vomiting, abdominal pain, dysuria, urinary frequency, hematuria, weakness, numbness or tingling, incontinence or changes in urinating and defecating.  Objective:   Vitals: BP 112/78 (BP Location: Left Arm) Comment (BP Location): large cuff  Pulse 94   Temp 98.3 F (36.8 C) (Oral)   Resp 18   SpO2 99%   Physical Exam Constitutional:      General: She is not in acute distress.    Appearance: Normal appearance. She is well-developed. She is not ill-appearing.  HENT:     Head: Normocephalic and atraumatic.     Nose: Nose normal.     Mouth/Throat:     Mouth: Mucous membranes are moist.     Pharynx: Oropharynx is clear.  Eyes:     General: No scleral icterus.    Extraocular Movements: Extraocular movements intact.     Pupils: Pupils are equal, round, and reactive to light.  Cardiovascular:     Rate and Rhythm: Normal rate.  Pulmonary:     Effort: Pulmonary effort is normal.  Musculoskeletal:     Lumbar back: She exhibits decreased range of motion (Flexion and extension), tenderness (Mild to moderate over area depicted) and spasm (Over lumbar paraspinal muscles). She exhibits no bony tenderness, no swelling, no edema and no deformity.  Back:  Skin:    General: Skin is warm and dry.  Neurological:     General: No focal deficit present.     Mental Status: She is alert and oriented to person, place, and time.     Motor: No weakness.     Coordination: Coordination normal.     Gait: Gait normal.     Deep Tendon Reflexes: Reflexes normal.  Psychiatric:        Mood and Affect: Mood normal.        Behavior: Behavior normal.    IMPRESSION: Normal MRI of the brain.   Electronically Signed   By: Ulyses Jarred M.D.   On: 02/18/2018 22:44    IMPRESSION: Essentially negative study of the cervical spine. Minimal, non-compressive disc bulges from  C3-4 through C5-6. No canal or foraminal stenosis. No evidence of facet arthropathy.   Electronically Signed   By: Nelson Chimes M.D.   On: 02/19/2018 09:09  Assessment and Plan :   1. Acute bilateral low back pain without sciatica   2. Strain of lumbar region, initial encounter     We will manage with prednisone for persistent low back pain associated with her fall.  We will also use muscle relaxant and modification of physical activity.  Recommended patient look into physical therapy as well.  Counseled patient on potential for adverse effects with medications prescribed/recommended today, ER and return-to-clinic precautions discussed, patient verbalized understanding.    Jaynee Eagles, PA-C 04/19/19 1740

## 2019-05-12 ENCOUNTER — Other Ambulatory Visit: Payer: Self-pay

## 2019-05-12 ENCOUNTER — Emergency Department (HOSPITAL_COMMUNITY)
Admission: EM | Admit: 2019-05-12 | Discharge: 2019-05-12 | Disposition: A | Discharge status: Home / Self Care | Payer: Self-pay | Attending: Emergency Medicine | Admitting: Emergency Medicine

## 2019-05-12 ENCOUNTER — Encounter (HOSPITAL_COMMUNITY): Payer: Self-pay | Admitting: Emergency Medicine

## 2019-05-12 ENCOUNTER — Emergency Department (HOSPITAL_COMMUNITY): Payer: Self-pay

## 2019-05-12 DIAGNOSIS — M7918 Myalgia, other site: Secondary | ICD-10-CM | POA: Insufficient documentation

## 2019-05-12 DIAGNOSIS — M79604 Pain in right leg: Secondary | ICD-10-CM | POA: Insufficient documentation

## 2019-05-12 DIAGNOSIS — J45909 Unspecified asthma, uncomplicated: Secondary | ICD-10-CM | POA: Insufficient documentation

## 2019-05-12 DIAGNOSIS — R42 Dizziness and giddiness: Secondary | ICD-10-CM | POA: Insufficient documentation

## 2019-05-12 DIAGNOSIS — R110 Nausea: Secondary | ICD-10-CM | POA: Insufficient documentation

## 2019-05-12 DIAGNOSIS — M25571 Pain in right ankle and joints of right foot: Secondary | ICD-10-CM | POA: Insufficient documentation

## 2019-05-12 DIAGNOSIS — R55 Syncope and collapse: Secondary | ICD-10-CM | POA: Insufficient documentation

## 2019-05-12 DIAGNOSIS — R079 Chest pain, unspecified: Secondary | ICD-10-CM | POA: Insufficient documentation

## 2019-05-12 DIAGNOSIS — R51 Headache: Secondary | ICD-10-CM | POA: Insufficient documentation

## 2019-05-12 LAB — CBC WITH DIFFERENTIAL/PLATELET
Abs Immature Granulocytes: 0.05 10*3/uL (ref 0.00–0.07)
Basophils Absolute: 0.1 10*3/uL (ref 0.0–0.1)
Basophils Relative: 1 %
Eosinophils Absolute: 0.1 10*3/uL (ref 0.0–0.5)
Eosinophils Relative: 2 %
HCT: 39.5 % (ref 36.0–46.0)
Hemoglobin: 12.8 g/dL (ref 12.0–15.0)
Immature Granulocytes: 1 %
Lymphocytes Relative: 29 %
Lymphs Abs: 2.3 10*3/uL (ref 0.7–4.0)
MCH: 30.2 pg (ref 26.0–34.0)
MCHC: 32.4 g/dL (ref 30.0–36.0)
MCV: 93.2 fL (ref 80.0–100.0)
Monocytes Absolute: 0.3 10*3/uL (ref 0.1–1.0)
Monocytes Relative: 4 %
Neutro Abs: 5.2 10*3/uL (ref 1.7–7.7)
Neutrophils Relative %: 63 %
Platelets: 332 10*3/uL (ref 150–400)
RBC: 4.24 MIL/uL (ref 3.87–5.11)
RDW: 12.6 % (ref 11.5–15.5)
WBC: 8.1 10*3/uL (ref 4.0–10.5)
nRBC: 0 % (ref 0.0–0.2)

## 2019-05-12 LAB — BASIC METABOLIC PANEL
Anion gap: 9 (ref 5–15)
BUN: 13 mg/dL (ref 6–20)
CO2: 23 mmol/L (ref 22–32)
Calcium: 9.2 mg/dL (ref 8.9–10.3)
Chloride: 105 mmol/L (ref 98–111)
Creatinine, Ser: 0.81 mg/dL (ref 0.44–1.00)
GFR calc Af Amer: >60 mL/min (ref >60)
GFR calc non Af Amer: >60 mL/min (ref >60)
Glucose, Bld: 120 mg/dL — ABNORMAL HIGH (ref 70–99)
Potassium: 3.8 mmol/L (ref 3.5–5.1)
Sodium: 137 mmol/L (ref 135–145)

## 2019-05-12 LAB — I-STAT BETA HCG BLOOD, ED (MC, WL, AP ONLY): I-stat hCG, quantitative: <5 m[IU]/mL (ref <5)

## 2019-05-12 LAB — CBG MONITORING, ED: Glucose-Capillary: 106 mg/dL — ABNORMAL HIGH (ref 70–99)

## 2019-05-12 MED ORDER — ACETAMINOPHEN 500 MG PO TABS
1000.0000 mg | ORAL_TABLET | Freq: Once | ORAL | Status: AC
  Administered 2019-05-12: 1000 mg via ORAL
  Filled 2019-05-12: qty 2

## 2019-05-12 NOTE — ED Notes (Signed)
Discharge assisted with interpreter # [phone]

## 2019-05-12 NOTE — Discharge Instructions (Addendum)
Please follow with your primary care doctor in the next 2 days for a check-up. They must obtain records for further management.  ° °Do not hesitate to return to the Emergency Department for any new, worsening or concerning symptoms.  ° °

## 2019-05-12 NOTE — ED Triage Notes (Signed)
Per EMS-states she went shopping with her son-came home and son was ringing door bell-daughter answered door and found mother sitting on the ground-patient complaining of right leg pain and "dizziness" states patient is exaggerating symptoms-able to bear weight although "acts" like she is going to fall-told daughter she couldn't talk-no s/s's of stroke, angioedema-no deformities

## 2019-05-12 NOTE — ED Provider Notes (Signed)
[facility] DEPT Provider Note   CSN: [id number] Arrival date & time: 05/12/19  1125     History   Chief Complaint Chief Complaint  Patient presents with   Leg Pain     HPI   Blood pressure 113/72, pulse 83, temperature 98.2 F (36.8 C), temperature source Oral, resp. rate (!) 22, height 5' (1.524 m), weight 87.1 kg, SpO2 98 %.  Lorraine [name] is a 33 y.o. female brought in by EMS status post syncope this morning, she states that she was going up a single step on her porch she felt very lightheaded, felt a buzzing in her ear nauseous and her eyes went dark and she passed out.  This was not witnessed.  She does not think she hit her head.  She is not anticoagulated.  She is a mild global headache with no change in vision, dysarthria, ataxia, cervicalgia.  After she got up from the syncope she had a chest pain which she still has but she states is very mild, midsternal and exacerbated by palpation.  She denies any shortness of breath but states earlier she was short of breath.  She also states that after the syncope she could not speak she felt like she was speaking but no one could hear her.  Patient states the pain is severe, she also has pain in the right ankle and diffusely along the right lateral leg.  She is not been ambulatory since the event.  She has no history of DVT/PE, no recent immobilizations, calf pain, leg swelling, exogenous estrogen.  No family history of early cardiac death.  No pain medication taken prior to arrival.  Communication via Occupational psychologist  Past Medical History:  Diagnosis Date   Angio-edema    Asthma    Inhaler used 01/02/16   Diverticulitis    Nausea & vomiting 04/02/2017   Pancreatitis    Pre-diabetes    Pyelonephritis    Urticaria     Patient Active Problem List   Diagnosis Date Noted   Intractable vomiting    Acute diverticulitis 09/20/2018   Cervical radiculopathy    Left arm weakness      Left-sided weakness 02/19/2018   Weakness 02/19/2018   Rash 02/09/2018   Chest pain 02/08/2018   Acute pancreatitis 04/02/2017   Abdominal pain, acute, left upper quadrant 04/02/2017   Intractable nausea and vomiting 04/02/2017   Asthma in adult 04/02/2017   Obesity (BMI 30.0-34.9) 04/02/2017   Fatty infiltration of liver 04/02/2017   Gastroesophageal reflux disease without esophagitis    Pre-diabetes 07/15/2015   Diarrhea    Hypokalemia    Bacterial vaginosis    Pyelonephritis 07/07/2015   Abdominal pain, right upper quadrant    Asthma 10/31/2014   History of preterm delivery 10/22/2014   Status post repeat low transverse cesarean section 09/06/2014   Diverticulitis 09/10/2012    Past Surgical History:  Procedure Laterality Date   CESAREAN SECTION     CESAREAN SECTION N/A 2006   CESAREAN SECTION N/A 09/03/2014   Procedure: CESAREAN SECTION;  Surgeon: Guss Bunde, MD;  Location: [facility];  Service: Obstetrics;  Laterality: N/A;   CHOLECYSTECTOMY     ESOPHAGOGASTRODUODENOSCOPY (EGD) WITH PROPOFOL Left 04/07/2017   Procedure: ESOPHAGOGASTRODUODENOSCOPY (EGD) WITH PROPOFOL;  Surgeon: Ronnette Juniper, MD;  Location: [facility];  Service: Gastroenterology;  Laterality: Left;   TUBAL LIGATION       OB History    Gravida  5   Para  3  Term  1   Preterm  2   AB  2   Living  3     SAB  2   TAB      Ectopic      Multiple  1   Live Births  3            Home Medications    Prior to Admission medications   Medication Sig Start Date End Date Taking? Authorizing Provider  predniSONE (DELTASONE) 20 MG tablet Take 2 tablets daily with breakfast. 04/19/19   Jaynee Eagles, PA-C  tizanidine (ZANAFLEX) 2 MG capsule Take 1 capsule (2 mg total) by mouth 3 (three) times daily as needed for muscle spasms. 04/19/19   Jaynee Eagles, PA-C  metoCLOPramide (REGLAN) 10 MG tablet Take 1 tablet (10 mg total) by mouth 3 (three) times daily as needed for  nausea (headache / nausea). 11/29/18 04/19/19  Margarita Mail, PA-C    Family History Family History  Problem Relation Age of Onset   Hyperlipidemia Mother    Diabetes Father    Ulcers Father    Diabetes Maternal Uncle    Diabetes Paternal Grandmother    Asthma Daughter    Allergic rhinitis Neg Hx    Angioedema Neg Hx    Eczema Neg Hx    Immunodeficiency Neg Hx    Urticaria Neg Hx     Social History Social History   Tobacco Use   Smoking status: Never Smoker   Smokeless tobacco: Never Used  Substance Use Topics   Alcohol use: No   Drug use: No     Allergies   Lupine bean extract, Morphine, and Ivp dye [iodinated diagnostic agents]   Review of Systems Review of Systems   Physical Exam Updated Vital Signs BP 113/72    Pulse 83    Temp 98.2 F (36.8 C) (Oral)    Resp (!) 22    Ht 5' (1.524 m)    Wt 87.1 kg    SpO2 98%    BMI 37.50 kg/m   Physical Exam Vitals signs and nursing note reviewed.  Constitutional:      General: She is not in acute distress.    Appearance: She is well-developed. She is not diaphoretic.     Comments: Tearful, appears uncomfortable  HENT:     Head: Normocephalic.  Eyes:     Conjunctiva/sclera: Conjunctivae normal.  Neck:     Musculoskeletal: Normal range of motion.     Vascular: No JVD.     Trachea: No tracheal deviation.  Cardiovascular:     Rate and Rhythm: Normal rate and regular rhythm.     Comments: Radial pulse equal bilaterally Pulmonary:     Effort: Pulmonary effort is normal. No respiratory distress.     Breath sounds: Normal breath sounds. No stridor. No wheezing, rhonchi or rales.  Chest:     Chest wall: No tenderness.  Abdominal:     General: There is no distension.     Palpations: Abdomen is soft. There is no mass.     Tenderness: There is no abdominal tenderness. There is no guarding or rebound.  Musculoskeletal: Normal range of motion.        General: No tenderness, deformity or signs of injury.       Right lower leg: Edema present.     Comments: No calf asymmetry, superficial collaterals, palpable cords, edema, Homans sign negative bilaterally.   Diffusely exquisitely tender to light palpation along the left lateral leg from the  hip to the foot, no focal bony tenderness.  Distally neurovascularly intact.   Skin:    General: Skin is warm.  Neurological:     Mental Status: She is alert and oriented to person, place, and time.      ED Treatments / Results  Labs (all labs ordered are listed, but only abnormal results are displayed) Labs Reviewed  BASIC METABOLIC PANEL - Abnormal; Notable for the following components:      Result Value   Glucose, Bld 120 (*)    All other components within normal limits  CBG MONITORING, ED - Abnormal; Notable for the following components:   Glucose-Capillary 106 (*)    All other components within normal limits  CBC WITH DIFFERENTIAL/PLATELET  I-STAT BETA HCG BLOOD, ED ([facility], [facility], [facility] ONLY)  I-STAT BETA HCG BLOOD, ED ([facility], [facility], [facility] ONLY)    EKG EKG Interpretation  Date/Time:  Tuesday May 12 2019 13:03:50 EDT Ventricular Rate:  72 PR Interval:    QRS Duration: 104 QT Interval:  392 QTC Calculation: 429 R Axis:   48 Text Interpretation:  Sinus rhythm Low voltage, precordial leads Confirmed by Gerlene Fee [phone]) on 05/12/2019 1:25:12 PM   Radiology Dg Chest 2 View  Result Date: 05/12/2019 CLINICAL DATA:  Found sitting on ground by family, RIGHT leg pain, dizziness EXAM: CHEST - 2 VIEW COMPARISON:  11/29/2018 FINDINGS: Upper normal size of cardiac silhouette. Mediastinal contours and pulmonary vascularity normal. Decreased lung volumes with minimal RIGHT basilar atelectasis. Lungs otherwise clear. No infiltrate, pleural effusion or pneumothorax. No osseous abnormalities. IMPRESSION: Minimal RIGHT basilar atelectasis. Electronically Signed   By: Lavonia Dana M.D.   On: 05/12/2019 14:19   Dg Ankle Complete Right  Result Date:  05/12/2019 CLINICAL DATA:  Found sitting on ground by family, RIGHT leg pain, dizziness EXAM: RIGHT ANKLE - COMPLETE 3+ VIEW COMPARISON:  None FINDINGS: Soft tissue swelling laterally. Osseous mineralization normal. Joint spaces preserved. No fracture, dislocation, or bone destruction. IMPRESSION: No acute osseous abnormalities. Electronically Signed   By: Lavonia Dana M.D.   On: 05/12/2019 14:20   Dg Knee Complete 4 Views Right  Result Date: 05/12/2019 CLINICAL DATA:  Found sitting on ground by family, RIGHT leg pain, dizziness EXAM: RIGHT KNEE - COMPLETE 4+ VIEW COMPARISON:  None FINDINGS: Osseous mineralization normal. Joint spaces preserved. No fracture, dislocation, or bone destruction. No joint effusion. IMPRESSION: Normal exam. Electronically Signed   By: Lavonia Dana M.D.   On: 05/12/2019 14:22   Dg Hip Unilat W Or Wo Pelvis 2-3 Views Right  Result Date: 05/12/2019 CLINICAL DATA:  Found sitting on ground by family, RIGHT leg pain, dizziness EXAM: DG HIP (WITH OR WITHOUT PELVIS) 2-3V RIGHT COMPARISON:  None. FINDINGS: Osseous mineralization normal. Joint spaces preserved. No fracture, dislocation, or bone destruction. Tubal ligation clips in pelvis. IMPRESSION: Normal exam. Electronically Signed   By: Lavonia Dana M.D.   On: 05/12/2019 14:21    Procedures Procedures (including critical care time)  Medications Ordered in ED Medications  acetaminophen (TYLENOL) tablet 1,000 mg (1,000 mg Oral Given 05/12/19 1314)     Initial Impression / Assessment and Plan / ED Course  I have reviewed the triage vital signs and the nursing notes.  Pertinent labs & imaging results that were available during my care of the patient were reviewed by me and considered in my medical decision making (see chart for details).        Vitals:   05/12/19 1131 05/12/19 1146 05/12/19 1148  BP: (!) 121/98 113/72   Pulse: 81 83   Resp: 17 (!) 22   Temp: 98.4 F (36.9 C) 98.2 F (36.8 C)   TempSrc: Oral Oral    SpO2: 99% 98%   Weight:   87.1 kg  Height:   5' (1.524 m)    Medications  acetaminophen (TYLENOL) tablet 1,000 mg (1,000 mg Oral Given 05/12/19 1314)    Shiyan Empey is 33 y.o. female presenting with syncopal event when she woke up she had some chest pain shortness of breath, severe shortness of breath has resolved but she has a midsternal chest pain that is reproducible to palpation.  She did not have any chest pain or shortness of breath before the syncopal event.  She states that after she fell down a single step she had some pain on the right leg, she is diffusely tender to palpation along the lateral aspect of the leg from the hip to the foot.  Nonambulatory since the event.  Patient has low risk by Wells score, PERC negative.  Nonfocal neurologic exam, no cervical pain, abdominal pain.  Will obtain basic blood work including hCG, EKG, chest x-ray, and imaging of the right lower extremity.  EKG, plain films, blood work all reassuring.  Advised patients to push fluid, ibuprofen for pain control and follow closely with primary care.  This is a shared visit with the attending physician who personally evaluated the patient and agrees with the care plan.   Evaluation does not show pathology that would require ongoing emergent intervention or inpatient treatment. Pt is hemodynamically stable and mentating appropriately. Discussed findings and plan with patient/guardian, who agrees with care plan. All questions answered. Return precautions discussed and outpatient follow up given.    Final Clinical Impressions(s) / ED Diagnoses   Final diagnoses:  Syncope, unspecified syncope type  Musculoskeletal pain    ED Discharge Orders    None       Waynetta Pean 05/12/19 1446    Maudie Flakes, MD 05/14/19 [phone]

## 2019-08-02 ENCOUNTER — Other Ambulatory Visit: Payer: Self-pay

## 2019-08-02 ENCOUNTER — Encounter (HOSPITAL_COMMUNITY): Payer: Self-pay | Admitting: Emergency Medicine

## 2019-08-02 ENCOUNTER — Ambulatory Visit (HOSPITAL_COMMUNITY)
Admission: EM | Admit: 2019-08-02 | Discharge: 2019-08-02 | Disposition: A | Discharge status: Home / Self Care | Payer: Self-pay | Attending: Emergency Medicine | Admitting: Emergency Medicine

## 2019-08-02 DIAGNOSIS — Z3202 Encounter for pregnancy test, result negative: Secondary | ICD-10-CM

## 2019-08-02 DIAGNOSIS — N3090 Cystitis, unspecified without hematuria: Secondary | ICD-10-CM

## 2019-08-02 LAB — POCT URINALYSIS DIP (DEVICE)
Bilirubin Urine: NEGATIVE
Glucose, UA: NEGATIVE mg/dL
Ketones, ur: NEGATIVE mg/dL
Leukocytes,Ua: NEGATIVE
Nitrite: NEGATIVE
Protein, ur: NEGATIVE mg/dL
Specific Gravity, Urine: 1.01 (ref 1.005–1.030)
Urobilinogen, UA: 0.2 mg/dL (ref 0.0–1.0)
pH: 6 (ref 5.0–8.0)

## 2019-08-02 LAB — POCT PREGNANCY, URINE: Preg Test, Ur: NEGATIVE

## 2019-08-02 MED ORDER — NITROFURANTOIN MONOHYD MACRO 100 MG PO CAPS: 100.0000 mg | ORAL_CAPSULE | Freq: Two times a day (BID) | ORAL | 0 refills | Status: AC

## 2019-08-02 NOTE — ED Provider Notes (Signed)
[place]    CSN: [id number] Arrival date & time: 08/02/19  1521      History   Chief Complaint Chief Complaint  Patient presents with  . Dysuria    HPI Lorraine [name] is a 33 y.o. female.   HPI Lorraine [name] is a 33 y.o. female presents for evaluation of urinary frequency, urgency and dysuria x 1 days, without flank pain, fever, chills, or abnormal vaginal discharge, abdominal pain, N&V, or bleeding. Reports no concern for STD.  Last menstrual period uncertain of date, endorses later part of October. Denies chronic or recurrent UTI. Past Medical History:  Diagnosis Date  . Angio-edema   . Asthma    Inhaler used 01/02/16  . Diverticulitis   . Nausea & vomiting 04/02/2017  . Pancreatitis   . Pre-diabetes   . Pyelonephritis   . Urticaria     Patient Active Problem List   Diagnosis Date Noted  . Intractable vomiting   . Acute diverticulitis 09/20/2018  . Cervical radiculopathy   . Left arm weakness   . Left-sided weakness 02/19/2018  . Weakness 02/19/2018  . Rash 02/09/2018  . Chest pain 02/08/2018  . Acute pancreatitis 04/02/2017  . Abdominal pain, acute, left upper quadrant 04/02/2017  . Intractable nausea and vomiting 04/02/2017  . Asthma in adult 04/02/2017  . Obesity (BMI 30.0-34.9) 04/02/2017  . Fatty infiltration of liver 04/02/2017  . Gastroesophageal reflux disease without esophagitis   . Pre-diabetes 07/15/2015  . Diarrhea   . Hypokalemia   . Bacterial vaginosis   . Pyelonephritis 07/07/2015  . Abdominal pain, right upper quadrant   . Asthma 10/31/2014  . History of preterm delivery 10/22/2014  . Status post repeat low transverse cesarean section 09/06/2014  . Diverticulitis 09/10/2012    Past Surgical History:  Procedure Laterality Date  . CESAREAN SECTION    . CESAREAN SECTION N/A 2006  . CESAREAN SECTION N/A 09/03/2014   Procedure: CESAREAN SECTION;  Surgeon: Guss Bunde, MD;  Location: [facility];  Service: Obstetrics;   Laterality: N/A;  . CHOLECYSTECTOMY    . ESOPHAGOGASTRODUODENOSCOPY (EGD) WITH PROPOFOL Left 04/07/2017   Procedure: ESOPHAGOGASTRODUODENOSCOPY (EGD) WITH PROPOFOL;  Surgeon: Ronnette Juniper, MD;  Location: [facility];  Service: Gastroenterology;  Laterality: Left;  . TUBAL LIGATION      OB History    Gravida  5   Para  3   Term  1   Preterm  2   AB  2   Living  3     SAB  2   TAB      Ectopic      Multiple  1   Live Births  3            Home Medications    Prior to Admission medications   Medication Sig Start Date End Date Taking? Authorizing Provider  predniSONE (DELTASONE) 20 MG tablet Take 2 tablets daily with breakfast. Patient not taking: Reported on 08/02/2019 04/19/19   Jaynee Eagles, PA-C  tizanidine (ZANAFLEX) 2 MG capsule Take 1 capsule (2 mg total) by mouth 3 (three) times daily as needed for muscle spasms. Patient not taking: Reported on 08/02/2019 04/19/19   Jaynee Eagles, PA-C  metoCLOPramide (REGLAN) 10 MG tablet Take 1 tablet (10 mg total) by mouth 3 (three) times daily as needed for nausea (headache / nausea). 11/29/18 04/19/19  Margarita Mail, PA-C    Family History Family History  Problem Relation Age of Onset  . Hyperlipidemia Mother   . Diabetes  Father   . Ulcers Father   . Diabetes Maternal Uncle   . Diabetes Paternal Grandmother   . Asthma Daughter   . Allergic rhinitis Neg Hx   . Angioedema Neg Hx   . Eczema Neg Hx   . Immunodeficiency Neg Hx   . Urticaria Neg Hx     Social History Social History   Tobacco Use  . Smoking status: Never Smoker  . Smokeless tobacco: Never Used  Substance Use Topics  . Alcohol use: No  . Drug use: No     Allergies   Lupine bean extract, Morphine, and Ivp dye [iodinated diagnostic agents]   Review of Systems Review of Systems Pertinent negatives listed in HPI Physical Exam Triage Vital Signs ED Triage Vitals  Enc Vitals Group     BP 08/02/19 1638 125/87     Pulse Rate 08/02/19 1638 73      Resp 08/02/19 1638 16     Temp 08/02/19 1638 98.6 F (37 C)     Temp src --      SpO2 08/02/19 1638 99 %     Weight --      Height --      Head Circumference --      Peak Flow --      Pain Score 08/02/19 1639 4     Pain Loc --      Pain Edu? --      Excl. in [place]? --    No data found.  Updated Vital Signs BP 125/87   Pulse 73   Temp 98.6 F (37 C)   Resp 16   LMP  (Within Weeks)   SpO2 99%   Visual Acuity Right Eye Distance:   Left Eye Distance:   Bilateral Distance:    Right Eye Near:   Left Eye Near:    Bilateral Near:     Physical Exam General appearance: alert, well developed, well nourished, cooperative and in no distress Head: Normocephalic, without obvious abnormality, atraumatic Respiratory: Respirations even and unlabored, normal respiratory rate Heart: rate and rhythm normal. No gallop or murmurs noted on exam  Abdomen: BS +, no distention, no rebound tenderness, or no mass Extremities: No gross deformities Skin: Skin color, texture, turgor normal. No rashes seen  Psych: Appropriate mood and affect. Neurologic: Mental status: Alert, oriented to person, place, and time, thought content appropriate.  UC Treatments / Results  Labs (all labs ordered are listed, but only abnormal results are displayed) Labs Reviewed  POC URINE PREG, ED    EKG   Radiology No results found.  Procedures Procedures (including critical care time)  Medications Ordered in UC Medications - No data to display  Initial Impression / Assessment and Plan / UC Course  I have reviewed the triage vital signs and the nursing notes.  Pertinent labs & imaging results that were available during my care of the patient were reviewed by me and considered in my medical decision making (see chart for details).    Treating empirically with short-course of nitrofurantion for suspected cystitis. UA normal. Culture pending. Recommended follow-up if course of treatment did not resolve  problem.   Final Clinical Impressions(s) / UC Diagnoses   Final diagnoses:  Dysuria     Discharge Instructions     Los resultados de orina no fueron notables para bacterias obvias. Sin embargo, sus sntomas actuales son consistentes con UTI. Comenzando con Nitrofurantona 100 mg [place] x 3 das. Urocultivo pendiente si se necesita  tratamiento adicional despus de recibir los [place], se lo notificaremos por telfono utilizando un intrprete de espaol. De lo contrario, hidrtese bien con agua o jugo de arndano para eliminar la [place].   Urine results were unremarkable for obvious bacteria. However, your current symptoms are consistent with UTI. Starting you on Nitrofurantoin 100 mg twice daily x 3 days . Urine culture pending if additional treatment is needed after results of culture are received we will notify you via phone using a [place] interpreter.  Otherwise hydrate well with water or cranberry juice in order to flush urine.    ED Prescriptions    Medication Sig Dispense Auth. Provider   nitrofurantoin, macrocrystal-monohydrate, (MACROBID) 100 MG capsule Take 1 capsule (100 mg total) by mouth 2 (two) times daily for 3 days. 6 capsule Scot Jun, FNP     PDMP not reviewed this encounter.   Scot Jun, [place] 08/03/19 2128

## 2019-08-02 NOTE — Discharge Instructions (Addendum)
Los resultados de orina no fueron notables para bacterias obvias. Sin embargo, sus sntomas actuales son consistentes con UTI. Comenzando con Nitrofurantona 100 mg [place] x 3 das. Urocultivo pendiente si se necesita tratamiento adicional despus de recibir los [place], se lo notificaremos por telfono utilizando un intrprete de espaol. De lo contrario, hidrtese bien con agua o jugo de arndano para eliminar la [place].   Urine results were unremarkable for obvious bacteria. However, your current symptoms are consistent with UTI. Starting you on Nitrofurantoin 100 mg twice daily x 3 days . Urine culture pending if additional treatment is needed after results of culture are received we will notify you via phone using a [place] interpreter.  Otherwise hydrate well with water or cranberry juice in order to flush urine.

## 2019-08-02 NOTE — ED Triage Notes (Signed)
With spanish interpreter, ever since saturday "I started having brownish color whenever I urinate". Burning with urination.

## 2019-08-03 ENCOUNTER — Encounter (HOSPITAL_COMMUNITY): Payer: Self-pay | Admitting: Emergency Medicine

## 2019-08-03 ENCOUNTER — Emergency Department (HOSPITAL_COMMUNITY)
Admission: EM | Admit: 2019-08-03 | Discharge: 2019-08-04 | Disposition: A | Discharge status: Home / Self Care | Payer: Self-pay | Attending: Emergency Medicine | Admitting: Emergency Medicine

## 2019-08-03 ENCOUNTER — Other Ambulatory Visit: Payer: Self-pay

## 2019-08-03 DIAGNOSIS — M545 Low back pain: Secondary | ICD-10-CM | POA: Insufficient documentation

## 2019-08-03 DIAGNOSIS — Z5321 Procedure and treatment not carried out due to patient leaving prior to being seen by health care provider: Secondary | ICD-10-CM | POA: Insufficient documentation

## 2019-08-03 LAB — BASIC METABOLIC PANEL
Anion gap: 9 (ref 5–15)
BUN: 14 mg/dL (ref 6–20)
CO2: 25 mmol/L (ref 22–32)
Calcium: 9.7 mg/dL (ref 8.9–10.3)
Chloride: 106 mmol/L (ref 98–111)
Creatinine, Ser: 0.7 mg/dL (ref 0.44–1.00)
GFR calc Af Amer: >60 mL/min (ref >60)
GFR calc non Af Amer: >60 mL/min (ref >60)
Glucose, Bld: 93 mg/dL (ref 70–99)
Potassium: 3.5 mmol/L (ref 3.5–5.1)
Sodium: 140 mmol/L (ref 135–145)

## 2019-08-03 LAB — CBC
HCT: 42.6 % (ref 36.0–46.0)
Hemoglobin: 13.7 g/dL (ref 12.0–15.0)
MCH: 30 pg (ref 26.0–34.0)
MCHC: 32.2 g/dL (ref 30.0–36.0)
MCV: 93.4 fL (ref 80.0–100.0)
Platelets: 248 10*3/uL (ref 150–400)
RBC: 4.56 MIL/uL (ref 3.87–5.11)
RDW: 12.4 % (ref 11.5–15.5)
WBC: 8.9 10*3/uL (ref 4.0–10.5)
nRBC: 0 % (ref 0.0–0.2)

## 2019-08-03 LAB — URINE CULTURE

## 2019-08-03 LAB — HCG, QUANTITATIVE, PREGNANCY: hCG, Beta Chain, Quant, S: <1 m[IU]/mL (ref <5)

## 2019-08-03 NOTE — ED Triage Notes (Signed)
Patient complaining of having blood in urine, pain during urination, and lower back pain. Patient states it started Friday.

## 2019-08-04 LAB — URINALYSIS, ROUTINE W REFLEX MICROSCOPIC
Bilirubin Urine: NEGATIVE
Glucose, UA: NEGATIVE mg/dL
Ketones, ur: 20 mg/dL — AB
Leukocytes,Ua: NEGATIVE
Nitrite: NEGATIVE
Protein, ur: NEGATIVE mg/dL
Specific Gravity, Urine: 1.012 (ref 1.005–1.030)
pH: 5 (ref 5.0–8.0)

## 2019-08-04 LAB — URINE CULTURE: Culture: <10000 — AB

## 2019-08-09 ENCOUNTER — Encounter (HOSPITAL_COMMUNITY): Payer: Self-pay

## 2019-08-09 ENCOUNTER — Emergency Department (HOSPITAL_COMMUNITY)
Admission: EM | Admit: 2019-08-09 | Discharge: 2019-08-10 | Disposition: A | Discharge status: Home / Self Care | Payer: Self-pay | Attending: Emergency Medicine | Admitting: Emergency Medicine

## 2019-08-09 ENCOUNTER — Other Ambulatory Visit: Payer: Self-pay

## 2019-08-09 DIAGNOSIS — R21 Rash and other nonspecific skin eruption: Secondary | ICD-10-CM | POA: Insufficient documentation

## 2019-08-09 DIAGNOSIS — L299 Pruritus, unspecified: Secondary | ICD-10-CM | POA: Insufficient documentation

## 2019-08-09 DIAGNOSIS — J45909 Unspecified asthma, uncomplicated: Secondary | ICD-10-CM | POA: Insufficient documentation

## 2019-08-09 DIAGNOSIS — T7840XA Allergy, unspecified, initial encounter: Secondary | ICD-10-CM

## 2019-08-09 MED ORDER — HYDROXYZINE HCL 25 MG PO TABS
25.0000 mg | ORAL_TABLET | Freq: Once | ORAL | Status: AC
  Administered 2019-08-10: 25 mg via ORAL
  Filled 2019-08-09: qty 1

## 2019-08-09 MED ORDER — HYDROXYZINE HCL 25 MG PO TABS: 25.0000 mg | ORAL_TABLET | Freq: Four times a day (QID) | ORAL | 0 refills | Status: DC

## 2019-08-09 MED ORDER — PREDNISONE 10 MG PO TABS: 20.0000 mg | ORAL_TABLET | Freq: Two times a day (BID) | ORAL | 0 refills | Status: DC

## 2019-08-09 MED ORDER — PREDNISONE 20 MG PO TABS
20.0000 mg | ORAL_TABLET | Freq: Once | ORAL | Status: AC
  Administered 2019-08-10: 20 mg via ORAL
  Filled 2019-08-09: qty 1

## 2019-08-09 NOTE — ED Triage Notes (Signed)
Pt reports hives and itching that started 20 minutes ago. Denies SOB. No oral swelling noted. Denies any allergy exposure. Has not taken any meds.

## 2019-08-09 NOTE — ED Notes (Signed)
Interpreter [phone] used in triage

## 2019-08-09 NOTE — Discharge Instructions (Addendum)
Begin taking prednisone and hydroxyzine as prescribed.  Return to the emergency department if you develop worsening rash, high fever, difficulty breathing or swallowing, or other new and concerning symptoms.

## 2019-08-09 NOTE — ED Provider Notes (Signed)
[facility] DEPT Provider Note   CSN: [id number] Arrival date & time: 08/09/19  2102     History   Chief Complaint Chief Complaint  Patient presents with  . Allergic Reaction    HPI Lorraine [name] is a 33 y.o. female.     Patient is a 33 year old female with history of prediabetes, asthma presenting with complaints of rash and itching.  This began earlier today in the absence of any new contacts or exposures.  Patient denies any difficulty breathing or swallowing.  She states the more she scratches, the more it itches.  She denies fevers or chills.  She denies any ill contacts.  The history is provided by the patient.  Allergic Reaction Presenting symptoms: itching and rash   Presenting symptoms: no difficulty breathing and no difficulty swallowing   Severity:  Moderate Duration:  12 hours Prior allergic episodes:  No prior episodes Context comment:  Unknown Relieved by:  Nothing Worsened by:  Nothing   Past Medical History:  Diagnosis Date  . Angio-edema   . Asthma    Inhaler used 01/02/16  . Diverticulitis   . Nausea & vomiting 04/02/2017  . Pancreatitis   . Pre-diabetes   . Pyelonephritis   . Urticaria     Patient Active Problem List   Diagnosis Date Noted  . Intractable vomiting   . Acute diverticulitis 09/20/2018  . Cervical radiculopathy   . Left arm weakness   . Left-sided weakness 02/19/2018  . Weakness 02/19/2018  . Rash 02/09/2018  . Chest pain 02/08/2018  . Acute pancreatitis 04/02/2017  . Abdominal pain, acute, left upper quadrant 04/02/2017  . Intractable nausea and vomiting 04/02/2017  . Asthma in adult 04/02/2017  . Obesity (BMI 30.0-34.9) 04/02/2017  . Fatty infiltration of liver 04/02/2017  . Gastroesophageal reflux disease without esophagitis   . Pre-diabetes 07/15/2015  . Diarrhea   . Hypokalemia   . Bacterial vaginosis   . Pyelonephritis 07/07/2015  . Abdominal pain, right upper quadrant   .  Asthma 10/31/2014  . History of preterm delivery 10/22/2014  . Status post repeat low transverse cesarean section 09/06/2014  . Diverticulitis 09/10/2012    Past Surgical History:  Procedure Laterality Date  . CESAREAN SECTION    . CESAREAN SECTION N/A 2006  . CESAREAN SECTION N/A 09/03/2014   Procedure: CESAREAN SECTION;  Surgeon: Guss Bunde, MD;  Location: [facility];  Service: Obstetrics;  Laterality: N/A;  . CHOLECYSTECTOMY    . ESOPHAGOGASTRODUODENOSCOPY (EGD) WITH PROPOFOL Left 04/07/2017   Procedure: ESOPHAGOGASTRODUODENOSCOPY (EGD) WITH PROPOFOL;  Surgeon: Ronnette Juniper, MD;  Location: [facility];  Service: Gastroenterology;  Laterality: Left;  . TUBAL LIGATION       OB History    Gravida  5   Para  3   Term  1   Preterm  2   AB  2   Living  3     SAB  2   TAB      Ectopic      Multiple  1   Live Births  3            Home Medications    Prior to Admission medications   Medication Sig Start Date End Date Taking? Authorizing Provider  predniSONE (DELTASONE) 20 MG tablet Take 2 tablets daily with breakfast. Patient not taking: Reported on 08/02/2019 04/19/19   Jaynee Eagles, PA-C  tizanidine (ZANAFLEX) 2 MG capsule Take 1 capsule (2 mg total) by mouth 3 (three) times daily  as needed for muscle spasms. Patient not taking: Reported on 08/02/2019 04/19/19   Jaynee Eagles, PA-C  metoCLOPramide (REGLAN) 10 MG tablet Take 1 tablet (10 mg total) by mouth 3 (three) times daily as needed for nausea (headache / nausea). 11/29/18 04/19/19  Margarita Mail, PA-C    Family History Family History  Problem Relation Age of Onset  . Hyperlipidemia Mother   . Diabetes Father   . Ulcers Father   . Diabetes Maternal Uncle   . Diabetes Paternal Grandmother   . Asthma Daughter   . Allergic rhinitis Neg Hx   . Angioedema Neg Hx   . Eczema Neg Hx   . Immunodeficiency Neg Hx   . Urticaria Neg Hx     Social History Social History   Tobacco Use  . Smoking status: Never  Smoker  . Smokeless tobacco: Never Used  Substance Use Topics  . Alcohol use: No  . Drug use: No     Allergies   Iodinated diagnostic agents, Lupine bean extract, and Morphine   Review of Systems Review of Systems  HENT: Negative for trouble swallowing.   Skin: Positive for itching and rash.  All other systems reviewed and are negative.    Physical Exam Updated Vital Signs BP (!) 134/105 (BP Location: Left Arm)   Pulse 87   Temp 98.2 F (36.8 C) (Oral)   Resp (!) 22   Ht 5' (1.524 m)   Wt 83 kg   SpO2 100%   BMI 35.74 kg/m   Physical Exam Vitals signs and nursing note reviewed.  Constitutional:      General: She is not in acute distress.    Appearance: She is well-developed. She is not diaphoretic.  HENT:     Head: Normocephalic and atraumatic.     Mouth/Throat:     Mouth: Mucous membranes are moist.     Pharynx: Oropharynx is clear.  Neck:     Musculoskeletal: Normal range of motion and neck supple.  Cardiovascular:     Rate and Rhythm: Normal rate and regular rhythm.     Heart sounds: No murmur. No friction rub. No gallop.   Pulmonary:     Effort: Pulmonary effort is normal. No respiratory distress.     Breath sounds: Normal breath sounds. No stridor. No wheezing.  Abdominal:     General: Bowel sounds are normal. There is no distension.     Palpations: Abdomen is soft.     Tenderness: There is no abdominal tenderness.  Musculoskeletal: Normal range of motion.  Skin:    General: Skin is warm and dry.     Findings: Rash present.     Comments: There is a generalized urticarial rash noted to both upper extremities and torso.  Neurological:     Mental Status: She is alert and oriented to person, place, and time.      ED Treatments / Results  Labs (all labs ordered are listed, but only abnormal results are displayed) Labs Reviewed - No data to display  EKG None  Radiology No results found.  Procedures Procedures (including critical care time)   Medications Ordered in ED Medications  predniSONE (DELTASONE) tablet 20 mg (has no administration in time range)  hydrOXYzine (ATARAX/VISTARIL) tablet 25 mg (has no administration in time range)     Initial Impression / Assessment and Plan / ED Course  I have reviewed the triage vital signs and the nursing notes.  Pertinent labs & imaging results that were available during my care  of the patient were reviewed by me and considered in my medical decision making (see chart for details).  This appears to be an allergic reaction, the etiology of which is unknown.  Patient will be given steroids and antihistamines and is to return as needed if worsening.  No signs of anaphylaxis.  Final Clinical Impressions(s) / ED Diagnoses   Final diagnoses:  None    ED Discharge Orders    None       Veryl Speak, MD 08/09/19 2344

## 2019-08-10 MED ORDER — HYDROXYZINE HCL 25 MG PO TABS: 25.0000 mg | ORAL_TABLET | Freq: Four times a day (QID) | ORAL | 0 refills | Status: DC

## 2019-08-10 NOTE — ED Notes (Signed)
Discharge instructions reviewed using Interpreter [place] [phone]. Patient verbalized understanding of discharge instructions with no questions. Patient in NAD at time of discharge.

## 2019-08-26 ENCOUNTER — Other Ambulatory Visit: Payer: Self-pay

## 2019-08-26 ENCOUNTER — Emergency Department (HOSPITAL_COMMUNITY)
Admission: EM | Admit: 2019-08-26 | Discharge: 2019-08-27 | Disposition: A | Discharge status: Home / Self Care | Payer: Self-pay | Attending: Emergency Medicine | Admitting: Emergency Medicine

## 2019-08-26 ENCOUNTER — Encounter (HOSPITAL_COMMUNITY): Payer: Self-pay | Admitting: Emergency Medicine

## 2019-08-26 DIAGNOSIS — Z5321 Procedure and treatment not carried out due to patient leaving prior to being seen by health care provider: Secondary | ICD-10-CM | POA: Insufficient documentation

## 2019-08-26 LAB — COMPREHENSIVE METABOLIC PANEL
ALT: 23 U/L (ref 0–44)
AST: 24 U/L (ref 15–41)
Albumin: 4 g/dL (ref 3.5–5.0)
Alkaline Phosphatase: 82 U/L (ref 38–126)
Anion gap: 8 (ref 5–15)
BUN: 9 mg/dL (ref 6–20)
CO2: 27 mmol/L (ref 22–32)
Calcium: 9.7 mg/dL (ref 8.9–10.3)
Chloride: 106 mmol/L (ref 98–111)
Creatinine, Ser: 0.73 mg/dL (ref 0.44–1.00)
GFR calc Af Amer: >60 mL/min (ref >60)
GFR calc non Af Amer: >60 mL/min (ref >60)
Glucose, Bld: 97 mg/dL (ref 70–99)
Potassium: 4.4 mmol/L (ref 3.5–5.1)
Sodium: 141 mmol/L (ref 135–145)
Total Bilirubin: 0.3 mg/dL (ref 0.3–1.2)
Total Protein: 7.1 g/dL (ref 6.5–8.1)

## 2019-08-26 LAB — CBC WITH DIFFERENTIAL/PLATELET
Abs Immature Granulocytes: 0.01 10*3/uL (ref 0.00–0.07)
Basophils Absolute: 0 10*3/uL (ref 0.0–0.1)
Basophils Relative: 1 %
Eosinophils Absolute: 0.2 10*3/uL (ref 0.0–0.5)
Eosinophils Relative: 2 %
HCT: 37.3 % (ref 36.0–46.0)
Hemoglobin: 12.4 g/dL (ref 12.0–15.0)
Immature Granulocytes: 0 %
Lymphocytes Relative: 40 %
Lymphs Abs: 3.3 10*3/uL (ref 0.7–4.0)
MCH: 30.5 pg (ref 26.0–34.0)
MCHC: 33.2 g/dL (ref 30.0–36.0)
MCV: 91.6 fL (ref 80.0–100.0)
Monocytes Absolute: 0.5 10*3/uL (ref 0.1–1.0)
Monocytes Relative: 6 %
Neutro Abs: 4.4 10*3/uL (ref 1.7–7.7)
Neutrophils Relative %: 51 %
Platelets: 219 10*3/uL (ref 150–400)
RBC: 4.07 MIL/uL (ref 3.87–5.11)
RDW: 12.7 % (ref 11.5–15.5)
WBC: 8.4 10*3/uL (ref 4.0–10.5)
nRBC: 0 % (ref 0.0–0.2)

## 2019-08-26 LAB — URINALYSIS, ROUTINE W REFLEX MICROSCOPIC
Bilirubin Urine: NEGATIVE
Glucose, UA: NEGATIVE mg/dL
Hgb urine dipstick: NEGATIVE
Ketones, ur: NEGATIVE mg/dL
Leukocytes,Ua: NEGATIVE
Nitrite: NEGATIVE
Protein, ur: NEGATIVE mg/dL
Specific Gravity, Urine: 1.001 — ABNORMAL LOW (ref 1.005–1.030)
pH: 7 (ref 5.0–8.0)

## 2019-08-26 LAB — I-STAT BETA HCG BLOOD, ED (MC, WL, AP ONLY): I-stat hCG, quantitative: <5 m[IU]/mL (ref <5)

## 2019-08-26 NOTE — ED Triage Notes (Signed)
Patient reports worsening left lower back pain onset last night with mild dysuria and hematuria . Denies fever or chills .

## 2019-08-27 NOTE — ED Notes (Signed)
Pts named called multiple times with no response.

## 2019-08-27 NOTE — ED Notes (Signed)
No answer for VS x1 

## 2019-09-28 ENCOUNTER — Encounter (HOSPITAL_COMMUNITY): Payer: Self-pay | Admitting: Emergency Medicine

## 2019-09-28 ENCOUNTER — Emergency Department (HOSPITAL_COMMUNITY)
Admission: EM | Admit: 2019-09-28 | Discharge: 2019-09-28 | Disposition: A | Discharge status: Home / Self Care | Payer: Self-pay | Attending: Emergency Medicine | Admitting: Emergency Medicine

## 2019-09-28 ENCOUNTER — Emergency Department (HOSPITAL_COMMUNITY): Payer: Self-pay

## 2019-09-28 ENCOUNTER — Other Ambulatory Visit: Payer: Self-pay

## 2019-09-28 DIAGNOSIS — J45909 Unspecified asthma, uncomplicated: Secondary | ICD-10-CM | POA: Insufficient documentation

## 2019-09-28 DIAGNOSIS — R1010 Upper abdominal pain, unspecified: Secondary | ICD-10-CM | POA: Insufficient documentation

## 2019-09-28 DIAGNOSIS — R112 Nausea with vomiting, unspecified: Secondary | ICD-10-CM | POA: Insufficient documentation

## 2019-09-28 DIAGNOSIS — Z79899 Other long term (current) drug therapy: Secondary | ICD-10-CM | POA: Insufficient documentation

## 2019-09-28 LAB — COMPREHENSIVE METABOLIC PANEL
ALT: 24 U/L (ref 0–44)
AST: 22 U/L (ref 15–41)
Albumin: 4 g/dL (ref 3.5–5.0)
Alkaline Phosphatase: 85 U/L (ref 38–126)
Anion gap: 8 (ref 5–15)
BUN: 17 mg/dL (ref 6–20)
CO2: 25 mmol/L (ref 22–32)
Calcium: 9.3 mg/dL (ref 8.9–10.3)
Chloride: 107 mmol/L (ref 98–111)
Creatinine, Ser: 0.68 mg/dL (ref 0.44–1.00)
GFR calc Af Amer: >60 mL/min (ref >60)
GFR calc non Af Amer: >60 mL/min (ref >60)
Glucose, Bld: 98 mg/dL (ref 70–99)
Potassium: 3.1 mmol/L — ABNORMAL LOW (ref 3.5–5.1)
Sodium: 140 mmol/L (ref 135–145)
Total Bilirubin: 0.5 mg/dL (ref 0.3–1.2)
Total Protein: 7 g/dL (ref 6.5–8.1)

## 2019-09-28 LAB — CBC WITH DIFFERENTIAL/PLATELET
Abs Immature Granulocytes: 0.07 10*3/uL (ref 0.00–0.07)
Basophils Absolute: 0.1 10*3/uL (ref 0.0–0.1)
Basophils Relative: 1 %
Eosinophils Absolute: 0.1 10*3/uL (ref 0.0–0.5)
Eosinophils Relative: 1 %
HCT: 35.9 % — ABNORMAL LOW (ref 36.0–46.0)
Hemoglobin: 11.9 g/dL — ABNORMAL LOW (ref 12.0–15.0)
Immature Granulocytes: 1 %
Lymphocytes Relative: 40 %
Lymphs Abs: 3.7 10*3/uL (ref 0.7–4.0)
MCH: 30.4 pg (ref 26.0–34.0)
MCHC: 33.1 g/dL (ref 30.0–36.0)
MCV: 91.8 fL (ref 80.0–100.0)
Monocytes Absolute: 0.5 10*3/uL (ref 0.1–1.0)
Monocytes Relative: 6 %
Neutro Abs: 4.9 10*3/uL (ref 1.7–7.7)
Neutrophils Relative %: 51 %
Platelets: 252 10*3/uL (ref 150–400)
RBC: 3.91 MIL/uL (ref 3.87–5.11)
RDW: 12.7 % (ref 11.5–15.5)
WBC: 9.3 10*3/uL (ref 4.0–10.5)
nRBC: 0 % (ref 0.0–0.2)

## 2019-09-28 LAB — URINALYSIS, ROUTINE W REFLEX MICROSCOPIC
Bacteria, UA: NONE SEEN
Bilirubin Urine: NEGATIVE
Glucose, UA: NEGATIVE mg/dL
Ketones, ur: NEGATIVE mg/dL
Leukocytes,Ua: NEGATIVE
Nitrite: NEGATIVE
Protein, ur: NEGATIVE mg/dL
Specific Gravity, Urine: 1.008 (ref 1.005–1.030)
pH: 6 (ref 5.0–8.0)

## 2019-09-28 LAB — LIPASE, BLOOD: Lipase: 25 U/L (ref 11–51)

## 2019-09-28 LAB — PREGNANCY, URINE: Preg Test, Ur: NEGATIVE

## 2019-09-28 MED ORDER — ONDANSETRON HCL 4 MG/2ML IJ SOLN
4.0000 mg | Freq: Once | INTRAMUSCULAR | Status: AC
  Administered 2019-09-28: 02:00:00 4 mg via INTRAVENOUS
  Filled 2019-09-28: qty 2

## 2019-09-28 MED ORDER — HYDROMORPHONE HCL 1 MG/ML IJ SOLN
0.5000 mg | Freq: Once | INTRAMUSCULAR | Status: AC
  Administered 2019-09-28: 0.5 mg via INTRAVENOUS
  Filled 2019-09-28: qty 1

## 2019-09-28 MED ORDER — METOCLOPRAMIDE HCL 10 MG PO TABS: 10.0000 mg | ORAL_TABLET | Freq: Four times a day (QID) | ORAL | 0 refills | Status: DC

## 2019-09-28 MED ORDER — HYDROMORPHONE HCL 1 MG/ML IJ SOLN
0.5000 mg | Freq: Once | INTRAMUSCULAR | Status: AC
  Administered 2019-09-28: 02:00:00 0.5 mg via INTRAVENOUS
  Filled 2019-09-28: qty 1

## 2019-09-28 MED ORDER — PROMETHAZINE HCL 25 MG/ML IJ SOLN
12.5000 mg | Freq: Once | INTRAMUSCULAR | Status: AC
  Administered 2019-09-28: 03:00:00 12.5 mg via INTRAVENOUS
  Filled 2019-09-28: qty 1

## 2019-09-28 MED ORDER — DROPERIDOL 2.5 MG/ML IJ SOLN
1.2500 mg | Freq: Once | INTRAMUSCULAR | Status: AC
  Administered 2019-09-28: 05:00:00 1.25 mg via INTRAVENOUS
  Filled 2019-09-28: qty 2

## 2019-09-28 NOTE — ED Notes (Signed)
Pt was able to drink water without coughing. Pt still feels nauseated.

## 2019-09-28 NOTE — ED Notes (Signed)
Patient transported to CT 

## 2019-09-28 NOTE — Discharge Instructions (Signed)
Take Reglan for nausea and pain as directed. Follow up with your doctor for recheck in 2 days.   Return to the emergency department with any severe pain, uncontrolled vomiting or high fever.

## 2019-09-28 NOTE — ED Provider Notes (Signed)
[facility] DEPT Provider Note   CSN: [id number] Arrival date & time: 09/28/19  0029     History Chief Complaint  Patient presents with  . Abdominal Pain    Lorraine [name] is a 34 y.o. female.  Spanish speaking patient with history of pancreatitis, diverticulitis, asthma presents pain in the upper abdomen, worse on the left, since yesterday morning, 09/27/19, associated with nausea and vomiting. No hematemesis. She has had a total of 3 episodes vomiting since symptoms began. No chest pain, SOB, cough, fever or diarrhea. She reports some urinary frequency and one episode of hematuria earlier tonight. No dysuria. The pain is constant and nonradiating. It is worse when sitting up or when walking.   The history is provided by the patient. A language interpreter was used.       Past Medical History:  Diagnosis Date  . Angio-edema   . Asthma    Inhaler used 01/02/16  . Diverticulitis   . Nausea & vomiting 04/02/2017  . Pancreatitis   . Pre-diabetes   . Pyelonephritis   . Urticaria     Patient Active Problem List   Diagnosis Date Noted  . Intractable vomiting   . Acute diverticulitis 09/20/2018  . Cervical radiculopathy   . Left arm weakness   . Left-sided weakness 02/19/2018  . Weakness 02/19/2018  . Rash 02/09/2018  . Chest pain 02/08/2018  . Acute pancreatitis 04/02/2017  . Abdominal pain, acute, left upper quadrant 04/02/2017  . Intractable nausea and vomiting 04/02/2017  . Asthma in adult 04/02/2017  . Obesity (BMI 30.0-34.9) 04/02/2017  . Fatty infiltration of liver 04/02/2017  . Gastroesophageal reflux disease without esophagitis   . Pre-diabetes 07/15/2015  . Diarrhea   . Hypokalemia   . Bacterial vaginosis   . Pyelonephritis 07/07/2015  . Abdominal pain, right upper quadrant   . Asthma 10/31/2014  . History of preterm delivery 10/22/2014  . Status post repeat low transverse cesarean section 09/06/2014  . Diverticulitis  09/10/2012    Past Surgical History:  Procedure Laterality Date  . CESAREAN SECTION    . CESAREAN SECTION N/A 2006  . CESAREAN SECTION N/A 09/03/2014   Procedure: CESAREAN SECTION;  Surgeon: Guss Bunde, MD;  Location: [facility];  Service: Obstetrics;  Laterality: N/A;  . CHOLECYSTECTOMY    . ESOPHAGOGASTRODUODENOSCOPY (EGD) WITH PROPOFOL Left 04/07/2017   Procedure: ESOPHAGOGASTRODUODENOSCOPY (EGD) WITH PROPOFOL;  Surgeon: Ronnette Juniper, MD;  Location: [facility];  Service: Gastroenterology;  Laterality: Left;  . TUBAL LIGATION       OB History    Gravida  5   Para  3   Term  1   Preterm  2   AB  2   Living  3     SAB  2   TAB      Ectopic      Multiple  1   Live Births  3           Family History  Problem Relation Age of Onset  . Hyperlipidemia Mother   . Diabetes Father   . Ulcers Father   . Diabetes Maternal Uncle   . Diabetes Paternal Grandmother   . Asthma Daughter   . Allergic rhinitis Neg Hx   . Angioedema Neg Hx   . Eczema Neg Hx   . Immunodeficiency Neg Hx   . Urticaria Neg Hx     Social History   Tobacco Use  . Smoking status: Never Smoker  . Smokeless tobacco: Never  Used  Substance Use Topics  . Alcohol use: No  . Drug use: No    Home Medications Prior to Admission medications   Medication Sig Start Date End Date Taking? Authorizing Provider  hydrOXYzine (ATARAX/VISTARIL) 25 MG tablet Take 1 tablet (25 mg total) by mouth every 6 (six) hours. 08/10/19   Veryl Speak, MD  predniSONE (DELTASONE) 10 MG tablet Take 2 tablets (20 mg total) by mouth 2 (two) times daily with a meal. 08/09/19   Veryl Speak, MD  tizanidine (ZANAFLEX) 2 MG capsule Take 1 capsule (2 mg total) by mouth 3 (three) times daily as needed for muscle spasms. Patient not taking: Reported on 08/02/2019 04/19/19   Jaynee Eagles, PA-C  metoCLOPramide (REGLAN) 10 MG tablet Take 1 tablet (10 mg total) by mouth 3 (three) times daily as needed for nausea (headache /  nausea). 11/29/18 04/19/19  Margarita Mail, PA-C    Allergies    Iodinated diagnostic agents, Lupine bean extract, and Morphine  Review of Systems   Review of Systems  Constitutional: Negative for chills and fever.  HENT: Negative.   Respiratory: Negative.  Negative for shortness of breath.   Cardiovascular: Negative.  Negative for chest pain.  Gastrointestinal: Positive for abdominal pain, nausea and vomiting.  Genitourinary: Positive for frequency and hematuria. Negative for dysuria.  Musculoskeletal: Negative.  Negative for back pain and myalgias.  Skin: Negative.   Neurological: Negative.  Negative for syncope and weakness.    Physical Exam Updated Vital Signs BP 128/88 (BP Location: Left Arm)   Pulse 78   Temp 98.5 F (36.9 C) (Oral)   Resp 16   Ht 5' (1.524 m)   Wt 78.9 kg   LMP 06/28/2019 (LMP Unknown)   SpO2 100%   BMI 33.98 kg/m   Physical Exam Vitals and nursing note reviewed.  Constitutional:      Appearance: She is well-developed.  HENT:     Head: Normocephalic.  Cardiovascular:     Rate and Rhythm: Normal rate and regular rhythm.  Pulmonary:     Effort: Pulmonary effort is normal.     Breath sounds: Normal breath sounds.  Abdominal:     General: Bowel sounds are decreased.     Palpations: Abdomen is soft.     Tenderness: There is abdominal tenderness in the right upper quadrant, epigastric area and left upper quadrant. There is guarding. There is no rebound.  Musculoskeletal:        General: Normal range of motion.     Cervical back: Normal range of motion and neck supple.  Skin:    General: Skin is warm and dry.     Findings: No rash.  Neurological:     Mental Status: She is alert.     Cranial Nerves: No cranial nerve deficit.     ED Results / Procedures / Treatments   Labs (all labs ordered are listed, but only abnormal results are displayed) Labs Reviewed - No data to display Results for orders placed or performed during the hospital  encounter of 09/28/19  CBC with Differential  Result Value Ref Range   WBC 9.3 4.0 - 10.5 K/uL   RBC 3.91 3.87 - 5.11 MIL/uL   Hemoglobin 11.9 (L) 12.0 - 15.0 g/dL   HCT 35.9 (L) 36.0 - 46.0 %   MCV 91.8 80.0 - 100.0 fL   MCH 30.4 26.0 - 34.0 pg   MCHC 33.1 30.0 - 36.0 g/dL   RDW 12.7 11.5 - 15.5 %   Platelets  252 150 - 400 K/uL   nRBC 0.0 0.0 - 0.2 %   Neutrophils Relative % 51 %   Neutro Abs 4.9 1.7 - 7.7 K/uL   Lymphocytes Relative 40 %   Lymphs Abs 3.7 0.7 - 4.0 K/uL   Monocytes Relative 6 %   Monocytes Absolute 0.5 0.1 - 1.0 K/uL   Eosinophils Relative 1 %   Eosinophils Absolute 0.1 0.0 - 0.5 K/uL   Basophils Relative 1 %   Basophils Absolute 0.1 0.0 - 0.1 K/uL   Immature Granulocytes 1 %   Abs Immature Granulocytes 0.07 0.00 - 0.07 K/uL  Lipase, blood  Result Value Ref Range   Lipase 25 11 - 51 U/L  Comprehensive metabolic panel  Result Value Ref Range   Sodium 140 135 - 145 mmol/L   Potassium 3.1 (L) 3.5 - 5.1 mmol/L   Chloride 107 98 - 111 mmol/L   CO2 25 22 - 32 mmol/L   Glucose, Bld 98 70 - 99 mg/dL   BUN 17 6 - 20 mg/dL   Creatinine, Ser 0.68 0.44 - 1.00 mg/dL   Calcium 9.3 8.9 - 10.3 mg/dL   Total Protein 7.0 6.5 - 8.1 g/dL   Albumin 4.0 3.5 - 5.0 g/dL   AST 22 15 - 41 U/L   ALT 24 0 - 44 U/L   Alkaline Phosphatase 85 38 - 126 U/L   Total Bilirubin 0.5 0.3 - 1.2 mg/dL   GFR calc non Af Amer >60 >60 mL/min   GFR calc Af Amer >60 >60 mL/min   Anion gap 8 5 - 15  Urinalysis, Routine w reflex microscopic  Result Value Ref Range   Color, Urine YELLOW YELLOW   APPearance CLEAR CLEAR   Specific Gravity, Urine 1.008 1.005 - 1.030   pH 6.0 5.0 - 8.0   Glucose, UA NEGATIVE NEGATIVE mg/dL   Hgb urine dipstick LARGE (A) NEGATIVE   Bilirubin Urine NEGATIVE NEGATIVE   Ketones, ur NEGATIVE NEGATIVE mg/dL   Protein, ur NEGATIVE NEGATIVE mg/dL   Nitrite NEGATIVE NEGATIVE   Leukocytes,Ua NEGATIVE NEGATIVE   RBC / HPF 0-5 0 - 5 RBC/hpf   WBC, UA 0-5 0 - 5 WBC/hpf    Bacteria, UA NONE SEEN NONE SEEN   Squamous Epithelial / LPF 0-5 0 - 5   Mucus PRESENT   Pregnancy, urine  Result Value Ref Range   Preg Test, Ur NEGATIVE NEGATIVE    EKG None  Radiology No results found. CT ABDOMEN PELVIS WO CONTRAST  Result Date: 09/28/2019 CLINICAL DATA:  Abdominal pain with 3 episodes of vomiting EXAM: CT ABDOMEN AND PELVIS WITHOUT CONTRAST TECHNIQUE: Multidetector CT imaging of the abdomen and pelvis was performed following the standard protocol without IV contrast. COMPARISON:  09/20/2018 FINDINGS: Lower chest:  No contributory findings. Hepatobiliary: No focal liver abnormality.Cholecystectomy Pancreas: Unremarkable. Spleen: Unremarkable. Adrenals/Urinary Tract: Negative adrenals. No hydronephrosis or stone. Unremarkable bladder. Stomach/Bowel: No obstruction. No appendicitis. Mild left colonic diverticulosis. Vascular/Lymphatic: No acute vascular abnormality. No mass or adenopathy. Reproductive:Tubal ligation clips Other: No ascites or pneumoperitoneum. Musculoskeletal: No acute abnormalities. IMPRESSION: 1. No acute finding. No bowel obstruction or evidence of inflammation. 2. Left colonic diverticulosis. Electronically Signed   By: Monte Fantasia M.D.   On: 09/28/2019 05:07    Procedures Procedures (including critical care time)  Medications Ordered in ED Medications - No data to display  ED Course  I have reviewed the triage vital signs and the nursing notes.  Pertinent labs & imaging results  that were available during my care of the patient were reviewed by me and considered in my medical decision making (see chart for details).    MDM Rules/Calculators/A&P                      Patient to ED with LUQ abd pain, N, V x 1 day. No fever. Feels similar to previous dx pancreatitis.   Multiple medications provided for relief of pain and nausea without relief. Will provide droperidol IV.   Labs are unremarkable and do not identify the cause of pain,  vomiting. Normal lipase. Feel given the degree of symptoms she has, CT scan is appropriate to further evaluation symptoms. VSS  CT scan does not show any acute process, including anything to suggest acute pancreatitis.   On recheck, after multiple doses pain and nausea medications, the patient reports feeling much better and is ready for discharge home. Encourage close PCP follow up.    Final Clinical Impression(s) / ED Diagnoses Final diagnoses:  None   1. Abdominal pain 2. Nausea with vomiting  Rx / DC Orders ED Discharge Orders    None       Charlann Lange, Hershal Coria 10/03/19 0617    Mesner, Corene Cornea, MD 10/06/19 [phone]

## 2019-09-28 NOTE — ED Triage Notes (Signed)
Pt reports abdominal pain that started today with 3 episodes of vomiting.

## 2019-11-25 ENCOUNTER — Ambulatory Visit (INDEPENDENT_AMBULATORY_CARE_PROVIDER_SITE_OTHER): Payer: Self-pay

## 2019-11-25 ENCOUNTER — Other Ambulatory Visit: Payer: Self-pay

## 2019-11-25 ENCOUNTER — Ambulatory Visit (HOSPITAL_COMMUNITY)
Admission: EM | Admit: 2019-11-25 | Discharge: 2019-11-25 | Disposition: A | Discharge status: Home / Self Care | Payer: Self-pay

## 2019-11-25 ENCOUNTER — Encounter (HOSPITAL_COMMUNITY): Payer: Self-pay | Admitting: Emergency Medicine

## 2019-11-25 DIAGNOSIS — K5900 Constipation, unspecified: Secondary | ICD-10-CM

## 2019-11-25 DIAGNOSIS — Z3202 Encounter for pregnancy test, result negative: Secondary | ICD-10-CM

## 2019-11-25 DIAGNOSIS — R109 Unspecified abdominal pain: Secondary | ICD-10-CM

## 2019-11-25 LAB — POCT URINALYSIS DIP (DEVICE)
Bilirubin Urine: NEGATIVE
Glucose, UA: NEGATIVE mg/dL
Hgb urine dipstick: NEGATIVE
Ketones, ur: NEGATIVE mg/dL
Leukocytes,Ua: NEGATIVE
Nitrite: NEGATIVE
Protein, ur: NEGATIVE mg/dL
Specific Gravity, Urine: 1.025 (ref 1.005–1.030)
Urobilinogen, UA: 0.2 mg/dL (ref 0.0–1.0)
pH: 6.5 (ref 5.0–8.0)

## 2019-11-25 MED ORDER — POLYETHYLENE GLYCOL 3350 17 G PO PACK: 17.0000 g | PACK | Freq: Every day | ORAL | 0 refills | Status: DC

## 2019-11-25 NOTE — Discharge Instructions (Addendum)
Believe that your pain is due to constipation. There was a large amount of stool on the right side where your pain is. I am sending in some MiraLAX to do to help with this. You can do 1 pack twice a day until good bowel movement.  And then use as needed. Take this with full glass of water. If your symptoms worsen to include worsening abdominal pain you will need to go to the ER.

## 2019-11-25 NOTE — ED Triage Notes (Signed)
Right side of abdomen with pain in upper and lower quadrant.  Patient has had nausea, no vomiting.  Last BM was today.  Denies pain with urination

## 2019-11-26 NOTE — ED Provider Notes (Signed)
[facility]    CSN: [id number] Arrival date & time: 11/25/19  1013      History   Chief Complaint Chief Complaint  Patient presents with  . Abdominal Pain    HPI Lorraine [name] is a 34 y.o. female.   Patient is a 34 year old female, Spanish-speaking.  Interpreter used.  She presents today with right upper quadrant and right lower quadrant pain.  This started yesterday.  Denies any associated nausea, vomiting or diarrhea.  Reporting last BM was this morning and normal.  She does have a history of constipation.  She also has history of diverticulitis, pancreatitis and chronic abdominal discomfort.  Denies alcohol use.  No fevers, chills.  ROS per HPI      Past Medical History:  Diagnosis Date  . Angio-edema   . Asthma    Inhaler used 01/02/16  . Diverticulitis   . Nausea & vomiting 04/02/2017  . Pancreatitis   . Pre-diabetes   . Pyelonephritis   . Urticaria     Patient Active Problem List   Diagnosis Date Noted  . Intractable vomiting   . Acute diverticulitis 09/20/2018  . Cervical radiculopathy   . Left arm weakness   . Left-sided weakness 02/19/2018  . Weakness 02/19/2018  . Rash 02/09/2018  . Chest pain 02/08/2018  . Acute pancreatitis 04/02/2017  . Abdominal pain, acute, left upper quadrant 04/02/2017  . Intractable nausea and vomiting 04/02/2017  . Asthma in adult 04/02/2017  . Obesity (BMI 30.0-34.9) 04/02/2017  . Fatty infiltration of liver 04/02/2017  . Gastroesophageal reflux disease without esophagitis   . Pre-diabetes 07/15/2015  . Diarrhea   . Hypokalemia   . Bacterial vaginosis   . Pyelonephritis 07/07/2015  . Abdominal pain, right upper quadrant   . Asthma 10/31/2014  . History of preterm delivery 10/22/2014  . Status post repeat low transverse cesarean section 09/06/2014  . Diverticulitis 09/10/2012    Past Surgical History:  Procedure Laterality Date  . CESAREAN SECTION    . CESAREAN SECTION N/A 2006  . CESAREAN  SECTION N/A 09/03/2014   Procedure: CESAREAN SECTION;  Surgeon: Guss Bunde, MD;  Location: [facility];  Service: Obstetrics;  Laterality: N/A;  . CHOLECYSTECTOMY    . ESOPHAGOGASTRODUODENOSCOPY (EGD) WITH PROPOFOL Left 04/07/2017   Procedure: ESOPHAGOGASTRODUODENOSCOPY (EGD) WITH PROPOFOL;  Surgeon: Ronnette Juniper, MD;  Location: [facility];  Service: Gastroenterology;  Laterality: Left;  . TUBAL LIGATION      OB History    Gravida  5   Para  3   Term  1   Preterm  2   AB  2   Living  3     SAB  2   TAB      Ectopic      Multiple  1   Live Births  3            Home Medications    Prior to Admission medications   Medication Sig Start Date End Date Taking? Authorizing Provider  ALBUTEROL IN Inhale into the lungs.    [provider]  polyethylene glycol (MIRALAX / GLYCOLAX) 17 g packet Take 17 g by mouth daily. 11/25/19   Loura Halt A, NP  metoCLOPramide (REGLAN) 10 MG tablet Take 1 tablet (10 mg total) by mouth every 6 (six) hours. 09/28/19 11/25/19  Charlann Lange, PA-C    Family History Family History  Problem Relation Age of Onset  . Hyperlipidemia Mother   . Diabetes Father   . Ulcers Father   .  Diabetes Maternal Uncle   . Diabetes Paternal Grandmother   . Asthma Daughter   . Allergic rhinitis Neg Hx   . Angioedema Neg Hx   . Eczema Neg Hx   . Immunodeficiency Neg Hx   . Urticaria Neg Hx     Social History Social History   Tobacco Use  . Smoking status: Never Smoker  . Smokeless tobacco: Never Used  Substance Use Topics  . Alcohol use: No  . Drug use: No     Allergies   Iodinated diagnostic agents, Lupine bean extract, and Morphine   Review of Systems Review of Systems   Physical Exam Triage Vital Signs ED Triage Vitals  Enc Vitals Group     BP 11/25/19 1112 115/73     Pulse Rate 11/25/19 1112 73     Resp 11/25/19 1112 20     Temp 11/25/19 1112 98.3 F (36.8 C)     Temp Source 11/25/19 1112 Oral     SpO2 11/25/19  1112 100 %     Weight --      Height --      Head Circumference --      Peak Flow --      Pain Score 11/25/19 1106 6     Pain Loc --      Pain Edu? --      Excl. in [place]? --    No data found.  Updated Vital Signs BP 115/73 (BP Location: Left Arm)   Pulse 73   Temp 98.3 F (36.8 C) (Oral)   Resp 20   LMP 11/02/2019   SpO2 100%   Visual Acuity Right Eye Distance:   Left Eye Distance:   Bilateral Distance:    Right Eye Near:   Left Eye Near:    Bilateral Near:     Physical Exam Vitals and nursing note reviewed.  Constitutional:      General: She is not in acute distress.    Appearance: Normal appearance. She is obese. She is not ill-appearing, toxic-appearing or diaphoretic.  HENT:     Head: Normocephalic.     Nose: Nose normal.     Mouth/Throat:     Pharynx: Oropharynx is clear.  Eyes:     Conjunctiva/sclera: Conjunctivae normal.  Pulmonary:     Effort: Pulmonary effort is normal.  Abdominal:     General: Bowel sounds are normal.     Palpations: Abdomen is soft. There is no hepatomegaly or splenomegaly.     Tenderness: There is abdominal tenderness in the right upper quadrant and right lower quadrant. There is no right CVA tenderness, left CVA tenderness, guarding or rebound.  Musculoskeletal:        General: Normal range of motion.     Cervical back: Normal range of motion.  Skin:    General: Skin is warm and dry.     Findings: No rash.  Neurological:     Mental Status: She is alert.  Psychiatric:        Mood and Affect: Mood normal.      UC Treatments / Results  Labs (all labs ordered are listed, but only abnormal results are displayed) Labs Reviewed  POCT URINALYSIS DIP (DEVICE)  POCT PREGNANCY, URINE    EKG   Radiology DG Abd 1 View  Result Date: 11/25/2019 CLINICAL DATA:  Evaluation for constipation.  Abdominal pain. EXAM: ABDOMEN - 1 VIEW COMPARISON:  CT 09/28/2019. FINDINGS: Surgical clips in the right upper quadrant in the pelvis. Left  stool noted throughout the colon. No bowel distention or free air. No free air. No acute bony abnormality identified. IMPRESSION: No acute abnormality. Electronically Signed   By: Marcello Moores  Register   On: 11/25/2019 11:57    Procedures Procedures (including critical care time)  Medications Ordered in UC Medications - No data to display  Initial Impression / Assessment and Plan / UC Course  I have reviewed the triage vital signs and the nursing notes.  Pertinent labs & imaging results that were available during my care of the patient were reviewed by me and considered in my medical decision making (see chart for details).     Constipation-most likely diagnosis. X-ray with moderate amount of stool to right colon. No guarding or rebound on exam.  No concerns for appendicitis Not convinced of cholecystitis at this time. VSS, no N,V,D or fever  Patient had recent CT scan and has chronic abdominal pain and suffers from chronic constipation. Recommended MiraLAX and increase water. Recommended if the abdominal pain continues or worsens despite this treatment she will  need to go to the ER. Patient understanding and agree.  All information explained using the Spanish interpreter Final Clinical Impressions(s) / UC Diagnoses   Final diagnoses:  Constipation, unspecified constipation type     Discharge Instructions     Believe that your pain is due to constipation. There was a large amount of stool on the right side where your pain is. I am sending in some MiraLAX to do to help with this. You can do 1 pack twice a day until good bowel movement.  And then use as needed. Take this with full glass of water. If your symptoms worsen to include worsening abdominal pain you will need to go to the ER.    ED Prescriptions    Medication Sig Dispense Auth. Provider   polyethylene glycol (MIRALAX / GLYCOLAX) 17 g packet Take 17 g by mouth daily. 14 each Orvan July, NP     PDMP not reviewed  this encounter.   Orvan July, NP 11/26/19 [phone]

## 2019-11-27 ENCOUNTER — Emergency Department (HOSPITAL_COMMUNITY): Payer: Self-pay

## 2019-11-27 ENCOUNTER — Other Ambulatory Visit: Payer: Self-pay

## 2019-11-27 ENCOUNTER — Emergency Department (HOSPITAL_COMMUNITY)
Admission: EM | Admit: 2019-11-27 | Discharge: 2019-11-27 | Disposition: A | Discharge status: Home / Self Care | Payer: Self-pay | Attending: Emergency Medicine | Admitting: Emergency Medicine

## 2019-11-27 DIAGNOSIS — J45909 Unspecified asthma, uncomplicated: Secondary | ICD-10-CM | POA: Insufficient documentation

## 2019-11-27 DIAGNOSIS — R110 Nausea: Secondary | ICD-10-CM

## 2019-11-27 DIAGNOSIS — Z79899 Other long term (current) drug therapy: Secondary | ICD-10-CM | POA: Insufficient documentation

## 2019-11-27 DIAGNOSIS — R1011 Right upper quadrant pain: Secondary | ICD-10-CM | POA: Insufficient documentation

## 2019-11-27 DIAGNOSIS — R509 Fever, unspecified: Secondary | ICD-10-CM | POA: Insufficient documentation

## 2019-11-27 DIAGNOSIS — R112 Nausea with vomiting, unspecified: Secondary | ICD-10-CM | POA: Insufficient documentation

## 2019-11-27 LAB — CBC WITH DIFFERENTIAL/PLATELET
Abs Immature Granulocytes: 0.02 10*3/uL (ref 0.00–0.07)
Basophils Absolute: 0 10*3/uL (ref 0.0–0.1)
Basophils Relative: 1 %
Eosinophils Absolute: 0.1 10*3/uL (ref 0.0–0.5)
Eosinophils Relative: 2 %
HCT: 37.2 % (ref 36.0–46.0)
Hemoglobin: 12.2 g/dL (ref 12.0–15.0)
Immature Granulocytes: 0 %
Lymphocytes Relative: 44 %
Lymphs Abs: 3.1 10*3/uL (ref 0.7–4.0)
MCH: 31.3 pg (ref 26.0–34.0)
MCHC: 32.8 g/dL (ref 30.0–36.0)
MCV: 95.4 fL (ref 80.0–100.0)
Monocytes Absolute: 0.4 10*3/uL (ref 0.1–1.0)
Monocytes Relative: 6 %
Neutro Abs: 3.4 10*3/uL (ref 1.7–7.7)
Neutrophils Relative %: 47 %
Platelets: 271 10*3/uL (ref 150–400)
RBC: 3.9 MIL/uL (ref 3.87–5.11)
RDW: 12.5 % (ref 11.5–15.5)
WBC: 7.1 10*3/uL (ref 4.0–10.5)
nRBC: 0 % (ref 0.0–0.2)

## 2019-11-27 LAB — COMPREHENSIVE METABOLIC PANEL
ALT: 17 U/L (ref 0–44)
AST: 33 U/L (ref 15–41)
Albumin: 4.2 g/dL (ref 3.5–5.0)
Alkaline Phosphatase: 80 U/L (ref 38–126)
Anion gap: 9 (ref 5–15)
BUN: 14 mg/dL (ref 6–20)
CO2: 25 mmol/L (ref 22–32)
Calcium: 8.9 mg/dL (ref 8.9–10.3)
Chloride: 105 mmol/L (ref 98–111)
Creatinine, Ser: 0.86 mg/dL (ref 0.44–1.00)
GFR calc Af Amer: >60 mL/min (ref >60)
GFR calc non Af Amer: >60 mL/min (ref >60)
Glucose, Bld: 83 mg/dL (ref 70–99)
Potassium: 4.4 mmol/L (ref 3.5–5.1)
Sodium: 139 mmol/L (ref 135–145)
Total Bilirubin: 0.8 mg/dL (ref 0.3–1.2)
Total Protein: 7.3 g/dL (ref 6.5–8.1)

## 2019-11-27 LAB — URINALYSIS, ROUTINE W REFLEX MICROSCOPIC
Bacteria, UA: NONE SEEN
Bilirubin Urine: NEGATIVE
Glucose, UA: NEGATIVE mg/dL
Ketones, ur: 5 mg/dL — AB
Leukocytes,Ua: NEGATIVE
Nitrite: NEGATIVE
Protein, ur: NEGATIVE mg/dL
Specific Gravity, Urine: 1.013 (ref 1.005–1.030)
pH: 6 (ref 5.0–8.0)

## 2019-11-27 LAB — HCG, QUANTITATIVE, PREGNANCY: hCG, Beta Chain, Quant, S: <1 m[IU]/mL (ref <5)

## 2019-11-27 LAB — LIPASE, BLOOD: Lipase: 22 U/L (ref 11–51)

## 2019-11-27 MED ORDER — ONDANSETRON HCL 4 MG/2ML IJ SOLN
4.0000 mg | Freq: Once | INTRAMUSCULAR | Status: AC
  Administered 2019-11-27: 4 mg via INTRAVENOUS
  Filled 2019-11-27: qty 2

## 2019-11-27 MED ORDER — DICYCLOMINE HCL 20 MG PO TABS: 20.0000 mg | ORAL_TABLET | Freq: Two times a day (BID) | ORAL | 0 refills | Status: DC

## 2019-11-27 MED ORDER — METOCLOPRAMIDE HCL 5 MG/ML IJ SOLN
10.0000 mg | Freq: Once | INTRAMUSCULAR | Status: AC
  Administered 2019-11-27: 10 mg via INTRAVENOUS
  Filled 2019-11-27: qty 2

## 2019-11-27 MED ORDER — DICYCLOMINE HCL 10 MG PO CAPS
20.0000 mg | ORAL_CAPSULE | Freq: Once | ORAL | Status: AC
  Administered 2019-11-27: 20 mg via ORAL
  Filled 2019-11-27: qty 2

## 2019-11-27 MED ORDER — LIDOCAINE VISCOUS HCL 2 % MT SOLN
15.0000 mL | Freq: Once | OROMUCOSAL | Status: AC
  Administered 2019-11-27: 15 mL via ORAL
  Filled 2019-11-27: qty 15

## 2019-11-27 MED ORDER — ONDANSETRON 4 MG PO TBDP: 4.0000 mg | ORAL_TABLET | Freq: Three times a day (TID) | ORAL | 0 refills | Status: DC | PRN

## 2019-11-27 MED ORDER — SODIUM CHLORIDE 0.9 % IV SOLN: Freq: Once | INTRAVENOUS | Status: AC

## 2019-11-27 MED ORDER — ALUM & MAG HYDROXIDE-SIMETH 200-200-20 MG/5ML PO SUSP
30.0000 mL | Freq: Once | ORAL | Status: AC
  Administered 2019-11-27: 30 mL via ORAL
  Filled 2019-11-27: qty 30

## 2019-11-27 NOTE — ED Notes (Signed)
Pt sitting up in bed. NAD noted. Ready for d/c 

## 2019-11-27 NOTE — ED Notes (Signed)
ED Provider at bedside. 

## 2019-11-27 NOTE — ED Triage Notes (Addendum)
Patient reports her abd pain is not going away and it hurts to walk. Pain rated 7/10, in right ride of abdomen radiating toward belly button. Pain described as throbbing/stabbing. Pain started yesterday morning. Patient reports nausea now, vomiting 30 mins ago, diarrhea 1 hour ago.

## 2019-11-27 NOTE — ED Notes (Signed)
Pt up walking to the bathroom. PO challenge successful. Pt denies N/V

## 2019-11-27 NOTE — Discharge Instructions (Addendum)
Please call to make an appointment with [facility] gastroenterology.   Please use Bentyl as prescribed for abdominal pain.  Please use Zofran as prescribed for nausea.

## 2019-11-27 NOTE — ED Provider Notes (Signed)
[facility] DEPT Provider Note   CSN: [id number] Arrival date & time: 11/27/19  1159     History Chief Complaint  Patient presents with  . Abdominal Pain    Lorraine [name] is a 34 y.o. female.  HPI  Patient is a 34 year old female with a history of chronic abdominal pain, diverticulitis, pancreatitis, pyelonephritis, constipation.   Patient states that she was seen 2 days ago at urgent care for similar symptoms.  She states her symptoms are right upper quadrant abdominal pain that is sharp and stabbing.  She states that it is worse with walking and feels like it hurts most when she takes steps.  She denies any worsening of pain with eating.  She states that she was thought to have constipation at urgent care after she had a abdominal x-ray which showed colonic stool.  She states that she has been taking MiraLAX as prescribed and had a normal, soft bowel movement with no melena or hematochezia just prior to arrival in ED.  Patient states that her symptoms initially began 3 days ago.  Patient states that yesterday she noticed that she had a fever of 101.  She took some Tylenol yesterday but did not take any today.  She endorses nausea does prevent her from eating food as well as vomiting that occurred 6 times yesterday and 3 times today which is nonbloody nonbilious.   She denies any urinary symptoms, any vaginal discharge or bleeding.  She states that apart from 3 hysterectomy she has had a cholecystectomy and no other abdominal surgeries.   Denies any alcohol use.    Past Medical History:  Diagnosis Date  . Angio-edema   . Asthma    Inhaler used 01/02/16  . Diverticulitis   . Nausea & vomiting 04/02/2017  . Pancreatitis   . Pre-diabetes   . Pyelonephritis   . Urticaria     Patient Active Problem List   Diagnosis Date Noted  . Intractable vomiting   . Acute diverticulitis 09/20/2018  . Cervical radiculopathy   . Left arm weakness   .  Left-sided weakness 02/19/2018  . Weakness 02/19/2018  . Rash 02/09/2018  . Chest pain 02/08/2018  . Acute pancreatitis 04/02/2017  . Abdominal pain, acute, left upper quadrant 04/02/2017  . Intractable nausea and vomiting 04/02/2017  . Asthma in adult 04/02/2017  . Obesity (BMI 30.0-34.9) 04/02/2017  . Fatty infiltration of liver 04/02/2017  . Gastroesophageal reflux disease without esophagitis   . Pre-diabetes 07/15/2015  . Diarrhea   . Hypokalemia   . Bacterial vaginosis   . Pyelonephritis 07/07/2015  . Abdominal pain, right upper quadrant   . Asthma 10/31/2014  . History of preterm delivery 10/22/2014  . Status post repeat low transverse cesarean section 09/06/2014  . Diverticulitis 09/10/2012    Past Surgical History:  Procedure Laterality Date  . CESAREAN SECTION    . CESAREAN SECTION N/A 2006  . CESAREAN SECTION N/A 09/03/2014   Procedure: CESAREAN SECTION;  Surgeon: Guss Bunde, MD;  Location: [facility];  Service: Obstetrics;  Laterality: N/A;  . CHOLECYSTECTOMY    . ESOPHAGOGASTRODUODENOSCOPY (EGD) WITH PROPOFOL Left 04/07/2017   Procedure: ESOPHAGOGASTRODUODENOSCOPY (EGD) WITH PROPOFOL;  Surgeon: Ronnette Juniper, MD;  Location: [facility];  Service: Gastroenterology;  Laterality: Left;  . TUBAL LIGATION       OB History    Gravida  5   Para  3   Term  1   Preterm  2   AB  2  Living  3     SAB  2   TAB      Ectopic      Multiple  1   Live Births  3           Family History  Problem Relation Age of Onset  . Hyperlipidemia Mother   . Diabetes Father   . Ulcers Father   . Diabetes Maternal Uncle   . Diabetes Paternal Grandmother   . Asthma Daughter   . Allergic rhinitis Neg Hx   . Angioedema Neg Hx   . Eczema Neg Hx   . Immunodeficiency Neg Hx   . Urticaria Neg Hx     Social History   Tobacco Use  . Smoking status: Never Smoker  . Smokeless tobacco: Never Used  Substance Use Topics  . Alcohol use: No  . Drug use: No    Home  Medications Prior to Admission medications   Medication Sig Start Date End Date Taking? Authorizing Provider  dicyclomine (BENTYL) 20 MG tablet Take 1 tablet (20 mg total) by mouth 2 (two) times daily. 11/27/19   Tedd Sias, PA  ondansetron (ZOFRAN ODT) 4 MG disintegrating tablet Take 1 tablet (4 mg total) by mouth every 8 (eight) hours as needed for nausea or vomiting. 11/27/19   Elinora Weigand, Kathleene Hazel, PA  polyethylene glycol (MIRALAX / GLYCOLAX) 17 g packet Take 17 g by mouth daily. 11/25/19   Loura Halt A, NP  metoCLOPramide (REGLAN) 10 MG tablet Take 1 tablet (10 mg total) by mouth every 6 (six) hours. 09/28/19 11/25/19  Charlann Lange, PA-C    Allergies    Iodinated diagnostic agents, Lupine bean extract, and Morphine  Review of Systems   Review of Systems  Constitutional: Positive for fatigue and fever. Negative for chills.  HENT: Negative for congestion.   Eyes: Negative for pain.  Respiratory: Negative for cough and shortness of breath.   Cardiovascular: Negative for chest pain and leg swelling.  Gastrointestinal: Positive for abdominal pain, nausea and vomiting.  Genitourinary: Negative for dysuria, hematuria, vaginal bleeding and vaginal discharge.  Musculoskeletal: Negative for myalgias.  Skin: Negative for rash.  Neurological: Negative for dizziness and headaches.    Physical Exam Updated Vital Signs BP 110/69 (BP Location: Left Arm)   Pulse 66   Temp 98.1 F (36.7 C) (Oral)   Resp 16   Ht 5' (1.524 m)   Wt 78.9 kg   LMP 11/02/2019 Comment: preg test waiver signed 11/27/19  SpO2 100%   BMI 33.97 kg/m   Physical Exam Vitals and nursing note reviewed.  Constitutional:      General: She is not in acute distress.    Comments: Patient is 34 year old female appears uncomfortable.  Especially which interpreter was used in entirety of physical exam.  She is able to answer questions appropriately follow commands.  Pleasant.  HENT:     Head: Normocephalic and atraumatic.       Nose: Nose normal.     Mouth/Throat:     Mouth: Mucous membranes are dry.  Eyes:     General: No scleral icterus. Cardiovascular:     Rate and Rhythm: Normal rate and regular rhythm.     Pulses: Normal pulses.     Heart sounds: Normal heart sounds.  Pulmonary:     Effort: Pulmonary effort is normal. No respiratory distress.     Breath sounds: No wheezing.  Abdominal:     Palpations: Abdomen is soft.     Tenderness: There  is no abdominal tenderness. There is no right CVA tenderness, left CVA tenderness, guarding or rebound.     Comments: Abdomen is protuberant with no distention, soft, no focal tenderness to palpation, negative Murphy sign, negative McBurney.  Musculoskeletal:     Cervical back: Normal range of motion.     Right lower leg: No edema.     Left lower leg: No edema.  Skin:    General: Skin is warm and dry.     Capillary Refill: Capillary refill takes less than 2 seconds.  Neurological:     Mental Status: She is alert. Mental status is at baseline.  Psychiatric:        Mood and Affect: Mood normal.        Behavior: Behavior normal.     ED Results / Procedures / Treatments   Labs (all labs ordered are listed, but only abnormal results are displayed) Labs Reviewed  URINALYSIS, ROUTINE W REFLEX MICROSCOPIC - Abnormal; Notable for the following components:      Result Value   Hgb urine dipstick SMALL (*)    Ketones, ur 5 (*)    All other components within normal limits  CBC WITH DIFFERENTIAL/PLATELET  COMPREHENSIVE METABOLIC PANEL  LIPASE, BLOOD  HCG, QUANTITATIVE, PREGNANCY    EKG None  Radiology CT ABDOMEN PELVIS WO CONTRAST  Result Date: 11/27/2019 CLINICAL DATA:  Abdominal pain EXAM: CT ABDOMEN AND PELVIS WITHOUT CONTRAST TECHNIQUE: Multidetector CT imaging of the abdomen and pelvis was performed following the standard protocol without IV contrast. COMPARISON:  09/28/2019 FINDINGS: Lower chest: Lung bases are free of acute infiltrate or sizable  effusion. Hepatobiliary: No focal liver abnormality is seen. Status post cholecystectomy. No biliary dilatation. Pancreas: Unremarkable. No pancreatic ductal dilatation or surrounding inflammatory changes. Spleen: Normal in size without focal abnormality. Adrenals/Urinary Tract: Adrenal glands appear within normal limits. Kidneys are well visualized bilaterally. No obstructive changes are noted. Bladder is decompressed. Stomach/Bowel: Scattered diverticular change of the colon is noted without evidence of diverticulitis. The appendix is within normal limits. No obstructive or inflammatory changes of the small bowel are seen. Vascular/Lymphatic: No significant vascular findings are present. No enlarged abdominal or pelvic lymph nodes. Reproductive: Tubal ligation clips are noted and stable. Uterus and adnexa are stable. Other: No abdominal wall hernia or abnormality. No abdominopelvic ascites. Musculoskeletal: No acute or significant osseous findings. IMPRESSION: Diverticular change without diverticulitis. Electronically Signed   By: Inez Catalina M.D.   On: 11/27/2019 18:18    Procedures Procedures (including critical care time)  Medications Ordered in ED Medications  0.9 %  sodium chloride infusion ( Intravenous New Bag/Given (Non-Interop) 11/27/19 1729)  ondansetron (ZOFRAN) injection 4 mg (4 mg Intravenous Given 11/27/19 1729)  dicyclomine (BENTYL) capsule 20 mg (20 mg Oral Given 11/27/19 1843)  alum & mag hydroxide-simeth (MAALOX/MYLANTA) 200-200-20 MG/5ML suspension 30 mL (30 mLs Oral Given 11/27/19 1844)    And  lidocaine (XYLOCAINE) 2 % viscous mouth solution 15 mL (15 mLs Oral Given 11/27/19 1844)  metoCLOPramide (REGLAN) injection 10 mg (10 mg Intravenous Given 11/27/19 1843)    ED Course  I have reviewed the triage vital signs and the nursing notes.  Pertinent labs & imaging results that were available during my care of the patient were reviewed by me and considered in my medical decision  making (see chart for details).    MDM Rules/Calculators/A&P                      Patient  a 34 year old female with past medical history significant for chronic abdominal pain, diverticulitis, pancreatitis, pyelonephritis.  She presented today with right upper quadrant abdominal pain.  She denies any concern for sexually transmitted diseases.  States that she is not currently sexually active.  Has no pelvic pain, vaginal discharge or vaginal pain.  The differential diagnosis for RUQ is, but not limited to:  Cholelithiasis, cholecystitis, hepatitis, eg, viral, alcoholic, toxic, cholangitis or choledocholithiasis, appendicitis (retrocecal), peptic ulcer disease (duodenal), pancreatitis, functional or nonulcer dyspepsia, liver abscess, liver, pancreatic, or biliary tract cancer, ischemic hepatopathy (shock liver), hepatic vein obstruction (Budd-Chiari syndrome), right lower lobe pneumonia, pyelonephritis, urinary calculi, Fitz-Hugh-Curtis syndrome (with pelvic inflammatory disease), liver cell adenoma, herpes zoster, trauma or musculoskeletal pain, herniated disk, abdominal abscess , intestinal ischemia, and physical or sexual abuse. ectopic pregnancy, IUP, Mittelschmerz, appendicitis, cholecystitis, Ovarian cyst/torsion, threatened/ievitable abortion, PID, endometriosis, molar pregnancy, UTI/renal colic, heterotopic pregnancy, IBD, corpus luteum cyst  Given this patient's physical exam and history more concerning for chronic abdominal pain VS gastritis VS peptic ulcer.  Patient feels improved after GI cocktail, Zofran, Reglan.  CT abdomen pelvis independently viewed and resolved.  Appears no acute abnormality.  No diverticulitis or abnormal stool burden.  Doubt SBO/LBO, doubt Rochele Raring, doubt pyelonephritis as patient has no urinary symptoms and has a clean urinalysis  Beta-hCG is negative.  Lipase within normal months.  Doubt pancreatitis.  No leukocytosis or anemia on CBC.  No abnormality on  CMP.  Scant/small blood on urinalysis patient has no CVA tenderness indicate kidney stone.  Ketones present likely due to decreased p.o. intake.  Patient is tolerating p.o. without difficulty.  Is ambulatory.  Denies any pain at this time.  We will discharge with follow-up with gastroenterology.  She is discharged with Bentyl and Zofran.   ---------------------- The medical records were personally reviewed by myself. I personally reviewed all lab results and interpreted all imaging studies and either concurred with their official read or contacted radiology for clarification.   This patient appears reasonably screened and I doubt any other medical condition requiring further workup, evaluation, or treatment in the ED at this time prior to discharge.   Patient's vitals are WNL apart from vital sign abnormalities discussed above, patient is in NAD, and able to ambulate in the ED at their baseline and able to tolerate PO.  Pain has been managed or a plan has been made for home management and has no complaints prior to discharge. Patient is comfortable with above plan and for discharge at this time. All questions were answered prior to disposition. Results from the ER workup discussed with the patient face to face and all questions answered to the best of my ability. The patient is safe for discharge with strict return precautions. Patient appears safe for discharge with appropriate follow-up. Conveyed my impression with the patient and they voiced understanding and are agreeable to plan.   An After Visit Summary was printed and given to the patient.  Portions of this note were generated with [employer]. Dictation errors may occur despite best attempts at proofreading.    Final Clinical Impression(s) / ED Diagnoses Final diagnoses:  Right upper quadrant abdominal pain  Nausea    Rx / DC Orders ED Discharge Orders         Ordered    ondansetron (ZOFRAN ODT) 4 MG  disintegrating tablet  Every 8 hours PRN     11/27/19 1953    dicyclomine (BENTYL) 20 MG tablet  2 times  daily     11/27/19 1953           Tedd Sias, [place] 11/27/19 2012    Valarie Merino, MD 11/28/19 0030

## 2019-12-25 ENCOUNTER — Other Ambulatory Visit: Payer: Self-pay

## 2019-12-25 ENCOUNTER — Emergency Department (HOSPITAL_COMMUNITY)
Admission: EM | Admit: 2019-12-25 | Discharge: 2019-12-25 | Disposition: A | Discharge status: Home / Self Care | Payer: Self-pay | Attending: Emergency Medicine | Admitting: Emergency Medicine

## 2019-12-25 ENCOUNTER — Encounter (HOSPITAL_COMMUNITY): Payer: Self-pay

## 2019-12-25 ENCOUNTER — Emergency Department (HOSPITAL_COMMUNITY): Payer: Self-pay

## 2019-12-25 DIAGNOSIS — Z79899 Other long term (current) drug therapy: Secondary | ICD-10-CM | POA: Insufficient documentation

## 2019-12-25 DIAGNOSIS — Y92410 Unspecified street and highway as the place of occurrence of the external cause: Secondary | ICD-10-CM | POA: Insufficient documentation

## 2019-12-25 DIAGNOSIS — S161XXA Strain of muscle, fascia and tendon at neck level, initial encounter: Secondary | ICD-10-CM | POA: Diagnosis not present

## 2019-12-25 DIAGNOSIS — Y999 Unspecified external cause status: Secondary | ICD-10-CM | POA: Diagnosis not present

## 2019-12-25 DIAGNOSIS — J45909 Unspecified asthma, uncomplicated: Secondary | ICD-10-CM | POA: Diagnosis not present

## 2019-12-25 DIAGNOSIS — Y93I9 Activity, other involving external motion: Secondary | ICD-10-CM | POA: Insufficient documentation

## 2019-12-25 DIAGNOSIS — S199XXA Unspecified injury of neck, initial encounter: Secondary | ICD-10-CM | POA: Diagnosis present

## 2019-12-25 MED ORDER — CYCLOBENZAPRINE HCL 10 MG PO TABS: 10.0000 mg | ORAL_TABLET | Freq: Two times a day (BID) | ORAL | 0 refills | Status: DC | PRN

## 2019-12-25 MED ORDER — IBUPROFEN 800 MG PO TABS
800.0000 mg | ORAL_TABLET | Freq: Once | ORAL | Status: AC
  Administered 2019-12-25: 19:00:00 800 mg via ORAL
  Filled 2019-12-25: qty 1

## 2019-12-25 NOTE — ED Notes (Signed)
Discharge instructions discussed with pt. Pt verbalized understanding with no questions at this time. Pt to go home with spouse at bedside °
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
## 2019-12-25 NOTE — Discharge Instructions (Signed)
Take Flexeril as prescribed as needed for muscle tightness. Take Motrin and Tylenol as needed as directed. Apply warm compresses to sore muscles for 20 minutes as needed. Recheck with your doctor. Return to ER for severe symptoms.

## 2019-12-27 ENCOUNTER — Other Ambulatory Visit: Payer: Self-pay

## 2019-12-27 ENCOUNTER — Ambulatory Visit (HOSPITAL_COMMUNITY)
Admission: EM | Admit: 2019-12-27 | Discharge: 2019-12-27 | Disposition: A | Discharge status: Home / Self Care | Payer: Self-pay | Attending: Urgent Care | Admitting: Urgent Care

## 2019-12-27 ENCOUNTER — Encounter (HOSPITAL_COMMUNITY): Payer: Self-pay

## 2019-12-27 DIAGNOSIS — M2569 Stiffness of other specified joint, not elsewhere classified: Secondary | ICD-10-CM

## 2019-12-27 DIAGNOSIS — S39012A Strain of muscle, fascia and tendon of lower back, initial encounter: Secondary | ICD-10-CM

## 2019-12-27 DIAGNOSIS — M542 Cervicalgia: Secondary | ICD-10-CM

## 2019-12-27 DIAGNOSIS — M436 Torticollis: Secondary | ICD-10-CM

## 2019-12-27 DIAGNOSIS — S161XXA Strain of muscle, fascia and tendon at neck level, initial encounter: Secondary | ICD-10-CM

## 2019-12-27 MED ORDER — TIZANIDINE HCL 4 MG PO TABS: 4.0000 mg | ORAL_TABLET | Freq: Three times a day (TID) | ORAL | 0 refills | Status: DC | PRN

## 2019-12-27 MED ORDER — KETOROLAC TROMETHAMINE 60 MG/2ML IM SOLN
INTRAMUSCULAR | Status: AC
  Filled 2019-12-27: qty 2

## 2019-12-27 MED ORDER — NAPROXEN 500 MG PO TABS: 500.0000 mg | ORAL_TABLET | Freq: Two times a day (BID) | ORAL | 0 refills | Status: DC

## 2019-12-27 MED ORDER — KETOROLAC TROMETHAMINE 60 MG/2ML IM SOLN
60.0000 mg | Freq: Once | INTRAMUSCULAR | Status: AC
  Administered 2019-12-27: 12:00:00 60 mg via INTRAMUSCULAR
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
## 2020-01-20 ENCOUNTER — Emergency Department (HOSPITAL_COMMUNITY)
Admission: EM | Admit: 2020-01-20 | Discharge: 2020-01-20 | Disposition: A | Discharge status: Home / Self Care | Payer: Self-pay | Attending: Emergency Medicine | Admitting: Emergency Medicine

## 2020-01-20 ENCOUNTER — Other Ambulatory Visit: Payer: Self-pay

## 2020-01-20 ENCOUNTER — Encounter (HOSPITAL_COMMUNITY): Payer: Self-pay | Admitting: *Deleted

## 2020-01-20 ENCOUNTER — Emergency Department (HOSPITAL_COMMUNITY): Payer: Self-pay

## 2020-01-20 DIAGNOSIS — J45909 Unspecified asthma, uncomplicated: Secondary | ICD-10-CM | POA: Insufficient documentation

## 2020-01-20 DIAGNOSIS — M549 Dorsalgia, unspecified: Secondary | ICD-10-CM | POA: Insufficient documentation

## 2020-01-20 DIAGNOSIS — R1012 Left upper quadrant pain: Secondary | ICD-10-CM | POA: Insufficient documentation

## 2020-01-20 DIAGNOSIS — R109 Unspecified abdominal pain: Secondary | ICD-10-CM

## 2020-01-20 DIAGNOSIS — R112 Nausea with vomiting, unspecified: Secondary | ICD-10-CM | POA: Insufficient documentation

## 2020-01-20 LAB — URINALYSIS, ROUTINE W REFLEX MICROSCOPIC
Bilirubin Urine: NEGATIVE
Glucose, UA: NEGATIVE mg/dL
Hgb urine dipstick: NEGATIVE
Ketones, ur: 5 mg/dL — AB
Leukocytes,Ua: NEGATIVE
Nitrite: NEGATIVE
Protein, ur: NEGATIVE mg/dL
Specific Gravity, Urine: 1.018 (ref 1.005–1.030)
pH: 6 (ref 5.0–8.0)

## 2020-01-20 LAB — COMPREHENSIVE METABOLIC PANEL
ALT: 16 U/L (ref 0–44)
AST: 22 U/L (ref 15–41)
Albumin: 4.4 g/dL (ref 3.5–5.0)
Alkaline Phosphatase: 80 U/L (ref 38–126)
Anion gap: 9 (ref 5–15)
BUN: 19 mg/dL (ref 6–20)
CO2: 24 mmol/L (ref 22–32)
Calcium: 8.7 mg/dL — ABNORMAL LOW (ref 8.9–10.3)
Chloride: 106 mmol/L (ref 98–111)
Creatinine, Ser: 0.7 mg/dL (ref 0.44–1.00)
GFR calc Af Amer: >60 mL/min (ref >60)
GFR calc non Af Amer: >60 mL/min (ref >60)
Glucose, Bld: 87 mg/dL (ref 70–99)
Potassium: 3.4 mmol/L — ABNORMAL LOW (ref 3.5–5.1)
Sodium: 139 mmol/L (ref 135–145)
Total Bilirubin: 0.5 mg/dL (ref 0.3–1.2)
Total Protein: 7.2 g/dL (ref 6.5–8.1)

## 2020-01-20 LAB — CBC WITH DIFFERENTIAL/PLATELET
Abs Immature Granulocytes: 0.02 10*3/uL (ref 0.00–0.07)
Basophils Absolute: 0 10*3/uL (ref 0.0–0.1)
Basophils Relative: 1 %
Eosinophils Absolute: 0.1 10*3/uL (ref 0.0–0.5)
Eosinophils Relative: 2 %
HCT: 38.7 % (ref 36.0–46.0)
Hemoglobin: 12.8 g/dL (ref 12.0–15.0)
Immature Granulocytes: 0 %
Lymphocytes Relative: 44 %
Lymphs Abs: 3.8 10*3/uL (ref 0.7–4.0)
MCH: 30.8 pg (ref 26.0–34.0)
MCHC: 33.1 g/dL (ref 30.0–36.0)
MCV: 93.3 fL (ref 80.0–100.0)
Monocytes Absolute: 0.4 10*3/uL (ref 0.1–1.0)
Monocytes Relative: 5 %
Neutro Abs: 4.2 10*3/uL (ref 1.7–7.7)
Neutrophils Relative %: 48 %
Platelets: 154 10*3/uL (ref 150–400)
RBC: 4.15 MIL/uL (ref 3.87–5.11)
RDW: 11.9 % (ref 11.5–15.5)
WBC: 8.6 10*3/uL (ref 4.0–10.5)
nRBC: 0 % (ref 0.0–0.2)

## 2020-01-20 LAB — I-STAT BETA HCG BLOOD, ED (MC, WL, AP ONLY): I-stat hCG, quantitative: <5 m[IU]/mL (ref <5)

## 2020-01-20 LAB — LIPASE, BLOOD: Lipase: 26 U/L (ref 11–51)

## 2020-01-20 MED ORDER — HYDROMORPHONE HCL 1 MG/ML IJ SOLN
1.0000 mg | Freq: Once | INTRAMUSCULAR | Status: AC
  Administered 2020-01-20: 1 mg via INTRAVENOUS
  Filled 2020-01-20: qty 1

## 2020-01-20 MED ORDER — KETOROLAC TROMETHAMINE 30 MG/ML IJ SOLN
30.0000 mg | Freq: Once | INTRAMUSCULAR | Status: AC
  Administered 2020-01-20: 30 mg via INTRAVENOUS
  Filled 2020-01-20: qty 1

## 2020-01-20 MED ORDER — METOCLOPRAMIDE HCL 5 MG/ML IJ SOLN
10.0000 mg | Freq: Once | INTRAMUSCULAR | Status: AC
  Administered 2020-01-20: 10 mg via INTRAVENOUS
  Filled 2020-01-20: qty 2

## 2020-01-20 MED ORDER — PROMETHAZINE HCL 25 MG/ML IJ SOLN
25.0000 mg | Freq: Once | INTRAMUSCULAR | Status: AC
  Administered 2020-01-20: 25 mg via INTRAVENOUS
  Filled 2020-01-20: qty 1

## 2020-01-20 MED ORDER — SODIUM CHLORIDE 0.9 % IV BOLUS
1000.0000 mL | Freq: Once | INTRAVENOUS | Status: AC
  Administered 2020-01-20: 1000 mL via INTRAVENOUS

## 2020-01-20 MED ORDER — ALUM & MAG HYDROXIDE-SIMETH 200-200-20 MG/5ML PO SUSP
30.0000 mL | Freq: Once | ORAL | Status: DC
  Filled 2020-01-20: qty 30

## 2020-01-20 MED ORDER — ONDANSETRON HCL 4 MG PO TABS
4.0000 mg | ORAL_TABLET | Freq: Four times a day (QID) | ORAL | 0 refills | Status: DC
Start: 2020-01-20 — End: 2020-06-09

## 2020-01-20 MED ORDER — ONDANSETRON HCL 4 MG/2ML IJ SOLN
4.0000 mg | Freq: Once | INTRAMUSCULAR | Status: AC
  Administered 2020-01-20: 4 mg via INTRAVENOUS
  Filled 2020-01-20: qty 2
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
## 2020-01-20 NOTE — Discharge Instructions (Signed)
You were given a prescription for zofran to help with your nausea. Please take as directed.  Please follow up with your primary care provider within 5-7 days for re-evaluation of your symptoms. If you do not have a primary care provider, information for a healthcare clinic has been provided for you to make arrangements for follow up care. Please return to the emergency department for any new or worsening symptoms.  ------   Marin Comment recetaron zofran para aliviar las nuseas. Tmelo segn las indicaciones.  Haga un seguimiento con su proveedor de Chief Executive Officer de 5 a 7 das para una reevaluacin de sus sntomas. Si no tiene un proveedor de [employer], se le ha proporcionado informacin para una clnica de atencin mdica para que pueda hacer arreglos para la atencin de seguimiento. Regrese al departamento de emergencias por cualquier sntoma nuevo o que empeore.

## 2020-01-20 NOTE — ED Triage Notes (Signed)
Onset of  back pain and nausea. Dry heaves in triage. No urinary problems

## 2020-02-02 ENCOUNTER — Encounter (HOSPITAL_COMMUNITY): Payer: Self-pay

## 2020-02-02 ENCOUNTER — Emergency Department (HOSPITAL_COMMUNITY)
Admission: EM | Admit: 2020-02-02 | Discharge: 2020-02-02 | Disposition: A | Discharge status: Home / Self Care | Payer: Self-pay | Attending: Emergency Medicine | Admitting: Emergency Medicine

## 2020-02-02 ENCOUNTER — Other Ambulatory Visit: Payer: Self-pay

## 2020-02-02 DIAGNOSIS — R1012 Left upper quadrant pain: Secondary | ICD-10-CM | POA: Insufficient documentation

## 2020-02-02 DIAGNOSIS — K219 Gastro-esophageal reflux disease without esophagitis: Secondary | ICD-10-CM | POA: Insufficient documentation

## 2020-02-02 DIAGNOSIS — R112 Nausea with vomiting, unspecified: Secondary | ICD-10-CM | POA: Insufficient documentation

## 2020-02-02 LAB — COMPREHENSIVE METABOLIC PANEL
ALT: 17 U/L (ref 0–44)
AST: 19 U/L (ref 15–41)
Albumin: 4.2 g/dL (ref 3.5–5.0)
Alkaline Phosphatase: 82 U/L (ref 38–126)
Anion gap: 7 (ref 5–15)
BUN: 15 mg/dL (ref 6–20)
CO2: 26 mmol/L (ref 22–32)
Calcium: 8.8 mg/dL — ABNORMAL LOW (ref 8.9–10.3)
Chloride: 106 mmol/L (ref 98–111)
Creatinine, Ser: 0.71 mg/dL (ref 0.44–1.00)
GFR calc Af Amer: >60 mL/min (ref >60)
GFR calc non Af Amer: >60 mL/min (ref >60)
Glucose, Bld: 92 mg/dL (ref 70–99)
Potassium: 4 mmol/L (ref 3.5–5.1)
Sodium: 139 mmol/L (ref 135–145)
Total Bilirubin: 0.4 mg/dL (ref 0.3–1.2)
Total Protein: 7.2 g/dL (ref 6.5–8.1)

## 2020-02-02 LAB — CBC
HCT: 36.9 % (ref 36.0–46.0)
Hemoglobin: 12 g/dL (ref 12.0–15.0)
MCH: 30.6 pg (ref 26.0–34.0)
MCHC: 32.5 g/dL (ref 30.0–36.0)
MCV: 94.1 fL (ref 80.0–100.0)
Platelets: 250 10*3/uL (ref 150–400)
RBC: 3.92 MIL/uL (ref 3.87–5.11)
RDW: 12.3 % (ref 11.5–15.5)
WBC: 7.7 10*3/uL (ref 4.0–10.5)
nRBC: 0 % (ref 0.0–0.2)

## 2020-02-02 LAB — URINALYSIS, ROUTINE W REFLEX MICROSCOPIC
Bilirubin Urine: NEGATIVE
Glucose, UA: NEGATIVE mg/dL
Hgb urine dipstick: NEGATIVE
Ketones, ur: NEGATIVE mg/dL
Leukocytes,Ua: NEGATIVE
Nitrite: NEGATIVE
Protein, ur: NEGATIVE mg/dL
Specific Gravity, Urine: 1.005 (ref 1.005–1.030)
pH: 6 (ref 5.0–8.0)

## 2020-02-02 LAB — LIPASE, BLOOD: Lipase: 30 U/L (ref 11–51)

## 2020-02-02 MED ORDER — FAMOTIDINE IN NACL 20-0.9 MG/50ML-% IV SOLN
20.0000 mg | Freq: Once | INTRAVENOUS | Status: AC
  Administered 2020-02-02: 20 mg via INTRAVENOUS
  Filled 2020-02-02: qty 50

## 2020-02-02 MED ORDER — HYOSCYAMINE SULFATE 0.125 MG PO TABS
0.1250 mg | ORAL_TABLET | Freq: Once | ORAL | Status: DC
  Filled 2020-02-02: qty 1

## 2020-02-02 MED ORDER — ONDANSETRON HCL 4 MG/2ML IJ SOLN
4.0000 mg | Freq: Once | INTRAMUSCULAR | Status: AC
  Administered 2020-02-02: 4 mg via INTRAVENOUS
  Filled 2020-02-02: qty 2

## 2020-02-02 MED ORDER — HYOSCYAMINE SULFATE 0.125 MG SL SUBL
0.1250 mg | SUBLINGUAL_TABLET | Freq: Once | SUBLINGUAL | Status: AC
  Administered 2020-02-02: 0.125 mg via SUBLINGUAL

## 2020-02-02 MED ORDER — SODIUM CHLORIDE 0.9% FLUSH
3.0000 mL | Freq: Once | INTRAVENOUS | Status: AC
  Administered 2020-02-02: 3 mL via INTRAVENOUS

## 2020-02-02 MED ORDER — SODIUM CHLORIDE 0.9 % IV BOLUS (SEPSIS)
1000.0000 mL | Freq: Once | INTRAVENOUS | Status: AC
  Administered 2020-02-02: 1000 mL via INTRAVENOUS

## 2020-02-02 MED ORDER — SODIUM CHLORIDE 0.9 % IV SOLN: 1000.0000 mL | INTRAVENOUS | Status: DC

## 2020-02-02 MED ORDER — OMEPRAZOLE 20 MG PO CPDR
20.0000 mg | DELAYED_RELEASE_CAPSULE | Freq: Every day | ORAL | 1 refills | Status: DC
Start: 2020-02-02 — End: 2020-12-06

## 2020-02-02 MED ORDER — HYOSCYAMINE SULFATE SL 0.125 MG SL SUBL
0.1250 mg | SUBLINGUAL_TABLET | Freq: Three times a day (TID) | SUBLINGUAL | 0 refills | Status: DC | PRN
Start: 2020-02-02 — End: 2020-12-06

## 2020-02-02 NOTE — ED Provider Notes (Signed)
[facility] DEPT Provider Note   CSN: [id number] Arrival date & time: 02/02/20  [id number]     History No chief complaint on file.   Lorraine [name] is a 34 y.o. female.  HPI Patient reports that she is having left upper abdominal pain for several days.  Also episodes of vomiting periodically.  Chills but no fevers.  No urinary symptoms.  No pain burning or difficulty to urinate.  Nothing has been helping the pain.  She has been having similar episodes before.    Past Medical History:  Diagnosis Date  . Angio-edema   . Asthma    Inhaler used 01/02/16  . Diverticulitis   . Nausea & vomiting 04/02/2017  . Pancreatitis   . Pre-diabetes   . Pyelonephritis   . Urticaria     Patient Active Problem List   Diagnosis Date Noted  . Intractable vomiting   . Acute diverticulitis 09/20/2018  . Cervical radiculopathy   . Left arm weakness   . Left-sided weakness 02/19/2018  . Weakness 02/19/2018  . Rash 02/09/2018  . Chest pain 02/08/2018  . Acute pancreatitis 04/02/2017  . Abdominal pain, acute, left upper quadrant 04/02/2017  . Intractable nausea and vomiting 04/02/2017  . Asthma in adult 04/02/2017  . Obesity (BMI 30.0-34.9) 04/02/2017  . Fatty infiltration of liver 04/02/2017  . Gastroesophageal reflux disease without esophagitis   . Pre-diabetes 07/15/2015  . Diarrhea   . Hypokalemia   . Bacterial vaginosis   . Pyelonephritis 07/07/2015  . Abdominal pain, right upper quadrant   . Asthma 10/31/2014  . History of preterm delivery 10/22/2014  . Status post repeat low transverse cesarean section 09/06/2014  . Diverticulitis 09/10/2012    Past Surgical History:  Procedure Laterality Date  . CESAREAN SECTION    . CESAREAN SECTION N/A 2006  . CESAREAN SECTION N/A 09/03/2014   Procedure: CESAREAN SECTION;  Surgeon: Guss Bunde, MD;  Location: [facility];  Service: Obstetrics;  Laterality: N/A;  . CHOLECYSTECTOMY    .  ESOPHAGOGASTRODUODENOSCOPY (EGD) WITH PROPOFOL Left 04/07/2017   Procedure: ESOPHAGOGASTRODUODENOSCOPY (EGD) WITH PROPOFOL;  Surgeon: Ronnette Juniper, MD;  Location: [facility];  Service: Gastroenterology;  Laterality: Left;  . TUBAL LIGATION       OB History    Gravida  5   Para  3   Term  1   Preterm  2   AB  2   Living  3     SAB  2   TAB      Ectopic      Multiple  1   Live Births  3           Family History  Problem Relation Age of Onset  . Hyperlipidemia Mother   . Diabetes Father   . Ulcers Father   . Diabetes Maternal Uncle   . Diabetes Paternal Grandmother   . Asthma Daughter   . Allergic rhinitis Neg Hx   . Angioedema Neg Hx   . Eczema Neg Hx   . Immunodeficiency Neg Hx   . Urticaria Neg Hx     Social History   Tobacco Use  . Smoking status: Never Smoker  . Smokeless tobacco: Never Used  Substance Use Topics  . Alcohol use: No  . Drug use: No    Home Medications Prior to Admission medications   Medication Sig Start Date End Date Taking? Authorizing Provider  albuterol (VENTOLIN HFA) 108 (90 Base) MCG/ACT inhaler Inhale 1-2 puffs into the lungs  every 6 (six) hours as needed for wheezing or shortness of breath.    [provider]  Hyoscyamine Sulfate SL (LEVSIN/SL) 0.125 MG SUBL Place 0.125 mg under the tongue every 8 (eight) hours as needed. 02/02/20   Charlesetta Shanks, MD  naproxen (NAPROSYN) 500 MG tablet Take 1 tablet (500 mg total) by mouth 2 (two) times daily. Patient not taking: Reported on 01/20/2020 12/27/19   Jaynee Eagles, PA-C  omeprazole (PRILOSEC) 20 MG capsule Take 1 capsule (20 mg total) by mouth daily. 02/02/20   Charlesetta Shanks, MD  ondansetron (ZOFRAN) 4 MG tablet Take 1 tablet (4 mg total) by mouth every 6 (six) hours. 01/20/20   Couture, Cortni S, PA-C  tiZANidine (ZANAFLEX) 4 MG tablet Take 1 tablet (4 mg total) by mouth every 8 (eight) hours as needed for muscle spasms. Patient not taking: Reported on 01/20/2020 12/27/19    Jaynee Eagles, PA-C  dicyclomine (BENTYL) 20 MG tablet Take 1 tablet (20 mg total) by mouth 2 (two) times daily. 11/27/19 12/27/19  Tedd Sias, PA  metoCLOPramide (REGLAN) 10 MG tablet Take 1 tablet (10 mg total) by mouth every 6 (six) hours. 09/28/19 11/25/19  Charlann Lange, PA-C    Allergies    Iodinated diagnostic agents, Lupine bean extract, and Morphine  Review of Systems   Review of Systems Constitutional: No fevers positive for chills Respiratory: No cough no shortness of breath. Cardiovascular: No chest pain Physical Exam Updated Vital Signs BP 117/77 (BP Location: Right Arm)   Pulse (!) 57   Temp 97.7 F (36.5 C)   Resp 19   Ht 5' (1.524 m)   Wt 74.8 kg   LMP 01/26/2020 (Approximate)   SpO2 100%   BMI 32.22 kg/m   Physical Exam Constitutional:      Comments: Patient is alert and nontoxic.  Clinically well in appearance.  No respiratory distress.  HENT:     Head: Normocephalic and atraumatic.  Eyes:     Extraocular Movements: Extraocular movements intact.  Cardiovascular:     Rate and Rhythm: Normal rate and regular rhythm.     Pulses: Normal pulses.     Heart sounds: Normal heart sounds.  Pulmonary:     Effort: Pulmonary effort is normal.     Breath sounds: Normal breath sounds.  Abdominal:     Comments: Abdomen is soft.  No guarding.  Mild to moderate discomfort in the left upper quadrant.  Lower abdomen is nontender.  No CVA tenderness.  Musculoskeletal:        General: No swelling or tenderness. Normal range of motion.     Cervical back: Neck supple.     Right lower leg: No edema.     Left lower leg: No edema.     Comments: Calves are soft and pliable.  Skin:    General: Skin is warm and dry.  Neurological:     General: No focal deficit present.     Mental Status: She is oriented to person, place, and time.     Coordination: Coordination normal.     ED Results / Procedures / Treatments   Labs (all labs ordered are listed, but only abnormal  results are displayed) Labs Reviewed  COMPREHENSIVE METABOLIC PANEL - Abnormal; Notable for the following components:      Result Value   Calcium 8.8 (*)    All other components within normal limits  URINALYSIS, ROUTINE W REFLEX MICROSCOPIC - Abnormal; Notable for the following components:   Color, Urine STRAW (*)  APPearance HAZY (*)    All other components within normal limits  LIPASE, BLOOD  CBC  I-STAT BETA HCG BLOOD, ED ([facility], [facility], [facility] ONLY)    EKG None  Radiology No results found.  Procedures Procedures (including critical care time)  Medications Ordered in ED Medications  sodium chloride 0.9 % bolus 1,000 mL (1,000 mLs Intravenous New Bag/Given 02/02/20 1340)    Followed by  0.9 %  sodium chloride infusion (has no administration in time range)  hyoscyamine (LEVSIN) tablet 0.125 mg (has no administration in time range)  sodium chloride flush (NS) 0.9 % injection 3 mL (3 mLs Intravenous Given 02/02/20 1343)  famotidine (PEPCID) IVPB 20 mg premix (20 mg Intravenous New Bag/Given 02/02/20 1342)  ondansetron (ZOFRAN) injection 4 mg (4 mg Intravenous Given 02/02/20 1341)    ED Course  I have reviewed the triage vital signs and the nursing notes.  Pertinent labs & imaging results that were available during my care of the patient were reviewed by me and considered in my medical decision making (see chart for details).    MDM Rules/Calculators/A&P                      Patient reports left upper abdominal pain.  This has been a recurrent presentation.  I reviewed the EMR and patient has had multiple CT scans done this year.  None identify acute etiologies.  Patient's diagnostic evaluation today is within normal limits.  No signs of acute surgical abdomen.  Patient is rehydrated and given Pepcid, Levsin and Zofran.  At this time will recommend daily use of omeprazole and Levsin on a as needed basis.  Recommend follow-up with PCP. Final Clinical Impression(s) / ED Diagnoses Final  diagnoses:  Left upper quadrant abdominal pain  Non-intractable vomiting with nausea, unspecified vomiting type    Rx / DC Orders ED Discharge Orders         Ordered    omeprazole (PRILOSEC) 20 MG capsule  Daily     02/02/20 1438    Hyoscyamine Sulfate SL (LEVSIN/SL) 0.125 MG SUBL  Every 8 hours PRN     02/02/20 1438           Charlesetta Shanks, MD 02/02/20 1446

## 2020-02-02 NOTE — ED Triage Notes (Signed)
Patient c/o left upper abdominal pain. X 2 days. Patient denies any N/v/D.

## 2020-02-02 NOTE — ED Notes (Signed)
IV start attempted x2 unsuccessful.  [place] IV placement requested.

## 2020-02-02 NOTE — Discharge Instructions (Addendum)
1.  Take Prilosec as prescribed every day.  Take it in the morning before you eat breakfast.  Eat about 30 minutes after you take the medicine. 2.  You may take Levsin tablet every 8 hours for cramping pain.  Place the tablet under your tongue and let it dissolve. 3.  Is very important that you have an appointment with a family doctor.  If you do not have a family doctor call [facility] listed in your discharge instructions.

## 2020-02-02 NOTE — ED Notes (Signed)
Margreta Journey interpreter [phone]-  Interpreter used to review discharge paperwork with pt.

## 2020-03-11 ENCOUNTER — Emergency Department (HOSPITAL_COMMUNITY)
Admission: EM | Admit: 2020-03-11 | Discharge: 2020-03-12 | Disposition: A | Discharge status: Home / Self Care | Payer: Self-pay | Attending: Emergency Medicine | Admitting: Emergency Medicine

## 2020-03-11 ENCOUNTER — Encounter (HOSPITAL_COMMUNITY): Payer: Self-pay

## 2020-03-11 ENCOUNTER — Other Ambulatory Visit: Payer: Self-pay

## 2020-03-11 DIAGNOSIS — L299 Pruritus, unspecified: Secondary | ICD-10-CM | POA: Insufficient documentation

## 2020-03-11 NOTE — ED Triage Notes (Signed)
Patient c/o itching all over x 40 minutes ago.

## 2020-05-08 ENCOUNTER — Other Ambulatory Visit: Payer: Self-pay

## 2020-05-08 ENCOUNTER — Ambulatory Visit (HOSPITAL_COMMUNITY)
Admission: EM | Admit: 2020-05-08 | Discharge: 2020-05-08 | Disposition: A | Discharge status: Home / Self Care | Payer: Self-pay | Attending: Urgent Care | Admitting: Urgent Care

## 2020-05-08 DIAGNOSIS — R079 Chest pain, unspecified: Secondary | ICD-10-CM

## 2020-05-08 NOTE — ED Triage Notes (Signed)
Pt c/o mid-sternal CP characterized as pressure 6/10 and SOB, dizziness, approx 30 min PTA and "feels like when I have asthma".  Also reports HA since yesterday. Denies n/v, radiating pain to arm/jaw/back, sore throat, congestion, cough, runny nose.  Bilat breath sounds CTA, S1, S2 RRR  EKG performed and results given to [employer] and Jaynee Eagles who advised pt should go to ER STAT for further eval of acute onset CP to r/o heart causation. Pt verbalizes understanding and spouse will drive to ER.  Patient is being discharged from the Urgent Care and sent to the Emergency Department via POV . Per Jaynee Eagles, patient is in need of higher level of care due to chest pain, acute, r/o MI. Patient is aware and verbalizes understanding of plan of care.  Vitals:   05/08/20 1313  BP: 122/84  Pulse: 79  Resp: 20  Temp: 98 F (36.7 C)  SpO2: 100%

## 2020-05-23 ENCOUNTER — Other Ambulatory Visit: Payer: Self-pay

## 2020-05-23 ENCOUNTER — Encounter (HOSPITAL_COMMUNITY): Payer: Self-pay

## 2020-05-23 ENCOUNTER — Emergency Department (HOSPITAL_COMMUNITY)
Admission: EM | Admit: 2020-05-23 | Discharge: 2020-05-23 | Disposition: A | Discharge status: Home / Self Care | Payer: Self-pay | Attending: Emergency Medicine | Admitting: Emergency Medicine

## 2020-05-23 DIAGNOSIS — R109 Unspecified abdominal pain: Secondary | ICD-10-CM | POA: Insufficient documentation

## 2020-05-23 DIAGNOSIS — R112 Nausea with vomiting, unspecified: Secondary | ICD-10-CM | POA: Insufficient documentation

## 2020-05-23 DIAGNOSIS — Z5321 Procedure and treatment not carried out due to patient leaving prior to being seen by health care provider: Secondary | ICD-10-CM | POA: Insufficient documentation

## 2020-05-23 LAB — I-STAT BETA HCG BLOOD, ED (MC, WL, AP ONLY): I-stat hCG, quantitative: <5 m[IU]/mL (ref <5)

## 2020-05-23 LAB — COMPREHENSIVE METABOLIC PANEL
ALT: 17 U/L (ref 0–44)
AST: 16 U/L (ref 15–41)
Albumin: 4.1 g/dL (ref 3.5–5.0)
Alkaline Phosphatase: 88 U/L (ref 38–126)
Anion gap: 9 (ref 5–15)
BUN: 18 mg/dL (ref 6–20)
CO2: 24 mmol/L (ref 22–32)
Calcium: 8.8 mg/dL — ABNORMAL LOW (ref 8.9–10.3)
Chloride: 105 mmol/L (ref 98–111)
Creatinine, Ser: 0.65 mg/dL (ref 0.44–1.00)
GFR calc Af Amer: >60 mL/min (ref >60)
GFR calc non Af Amer: >60 mL/min (ref >60)
Glucose, Bld: 101 mg/dL — ABNORMAL HIGH (ref 70–99)
Potassium: 3.7 mmol/L (ref 3.5–5.1)
Sodium: 138 mmol/L (ref 135–145)
Total Bilirubin: 0.2 mg/dL — ABNORMAL LOW (ref 0.3–1.2)
Total Protein: 7.4 g/dL (ref 6.5–8.1)

## 2020-05-23 LAB — CBC
HCT: 37.5 % (ref 36.0–46.0)
Hemoglobin: 12.6 g/dL (ref 12.0–15.0)
MCH: 30.5 pg (ref 26.0–34.0)
MCHC: 33.6 g/dL (ref 30.0–36.0)
MCV: 90.8 fL (ref 80.0–100.0)
Platelets: 286 10*3/uL (ref 150–400)
RBC: 4.13 MIL/uL (ref 3.87–5.11)
RDW: 12.1 % (ref 11.5–15.5)
WBC: 9.4 10*3/uL (ref 4.0–10.5)
nRBC: 0 % (ref 0.0–0.2)

## 2020-05-23 LAB — LIPASE, BLOOD: Lipase: 29 U/L (ref 11–51)

## 2020-05-23 MED ORDER — ONDANSETRON 4 MG PO TBDP
4.0000 mg | ORAL_TABLET | Freq: Once | ORAL | Status: AC | PRN
  Administered 2020-05-23: 4 mg via ORAL
  Filled 2020-05-23: qty 1

## 2020-05-23 NOTE — ED Triage Notes (Signed)
Patient c/o left mid abdominal pain and N/V since yesterday.

## 2020-05-23 NOTE — ED Notes (Signed)
I called patient for vital sign recheck and no one responded 

## 2020-06-06 ENCOUNTER — Emergency Department (HOSPITAL_COMMUNITY)
Admission: EM | Admit: 2020-06-06 | Discharge: 2020-06-06 | Disposition: A | Discharge status: Home / Self Care | Payer: Self-pay | Attending: Emergency Medicine | Admitting: Emergency Medicine

## 2020-06-06 ENCOUNTER — Encounter (HOSPITAL_COMMUNITY): Payer: Self-pay | Admitting: *Deleted

## 2020-06-06 ENCOUNTER — Other Ambulatory Visit: Payer: Self-pay

## 2020-06-06 ENCOUNTER — Emergency Department (HOSPITAL_COMMUNITY): Payer: Self-pay

## 2020-06-06 DIAGNOSIS — J45909 Unspecified asthma, uncomplicated: Secondary | ICD-10-CM | POA: Insufficient documentation

## 2020-06-06 DIAGNOSIS — Z7951 Long term (current) use of inhaled steroids: Secondary | ICD-10-CM | POA: Insufficient documentation

## 2020-06-06 DIAGNOSIS — R0789 Other chest pain: Secondary | ICD-10-CM | POA: Insufficient documentation

## 2020-06-06 LAB — CBC WITH DIFFERENTIAL/PLATELET
Abs Immature Granulocytes: 0.02 10*3/uL (ref 0.00–0.07)
Basophils Absolute: 0.1 10*3/uL (ref 0.0–0.1)
Basophils Relative: 1 %
Eosinophils Absolute: 0.1 10*3/uL (ref 0.0–0.5)
Eosinophils Relative: 1 %
HCT: 37.1 % (ref 36.0–46.0)
Hemoglobin: 12.2 g/dL (ref 12.0–15.0)
Immature Granulocytes: 0 %
Lymphocytes Relative: 39 %
Lymphs Abs: 2.8 10*3/uL (ref 0.7–4.0)
MCH: 30.2 pg (ref 26.0–34.0)
MCHC: 32.9 g/dL (ref 30.0–36.0)
MCV: 91.8 fL (ref 80.0–100.0)
Monocytes Absolute: 0.5 10*3/uL (ref 0.1–1.0)
Monocytes Relative: 7 %
Neutro Abs: 3.7 10*3/uL (ref 1.7–7.7)
Neutrophils Relative %: 52 %
Platelets: 268 10*3/uL (ref 150–400)
RBC: 4.04 MIL/uL (ref 3.87–5.11)
RDW: 12.5 % (ref 11.5–15.5)
WBC: 7.2 10*3/uL (ref 4.0–10.5)
nRBC: 0 % (ref 0.0–0.2)

## 2020-06-06 LAB — BASIC METABOLIC PANEL
Anion gap: 10 (ref 5–15)
BUN: 12 mg/dL (ref 6–20)
CO2: 23 mmol/L (ref 22–32)
Calcium: 9 mg/dL (ref 8.9–10.3)
Chloride: 105 mmol/L (ref 98–111)
Creatinine, Ser: 0.67 mg/dL (ref 0.44–1.00)
GFR calc Af Amer: >60 mL/min (ref >60)
GFR calc non Af Amer: >60 mL/min (ref >60)
Glucose, Bld: 112 mg/dL — ABNORMAL HIGH (ref 70–99)
Potassium: 3.6 mmol/L (ref 3.5–5.1)
Sodium: 138 mmol/L (ref 135–145)

## 2020-06-06 LAB — TROPONIN I (HIGH SENSITIVITY): Troponin I (High Sensitivity): <2 ng/L (ref <18)

## 2020-06-06 MED ORDER — ACETAMINOPHEN 500 MG PO TABS
1000.0000 mg | ORAL_TABLET | Freq: Once | ORAL | Status: AC
  Administered 2020-06-06: 1000 mg via ORAL
  Filled 2020-06-06: qty 2

## 2020-06-06 NOTE — ED Triage Notes (Signed)
Pt reports left breast pain/chest wall pain since yesterday. States breast is sore, pain increases with palpation and moving left arm. Denies recent cough. No distress noted.

## 2020-06-06 NOTE — Discharge Instructions (Addendum)
You have been seen here for chest pain.  Lab work and imaging all look reassuring.  I recommend taking over-the-counter pain medications like ibuprofen and or Tylenol every 6 as needed for pain.  I also recommend placing ice or heat on the area as this can decrease swelling and inflammation.  Please stretch out your pectoral muscle as this can help decrease stiffness.  I would like you to follow-up with your primary care provider in 2 weeks time if symptoms have not fully resolved.  If you do not have 1 please contact community health and wellness they can help you find a primary care provider.  I want you to come back to emergency department if you develop severe chest pain, shortness of breath, severe abdominal pain, uncontrolled nausea vomiting diarrhea as these symptoms require further evaluation management.

## 2020-06-06 NOTE — ED Provider Notes (Signed)
[facility] EMERGENCY DEPARTMENT Provider Note   CSN: [id number] Arrival date & time: 06/06/20  1205     History Chief Complaint  Patient presents with  . Breast Pain    Lorraine [name] is a 34 y.o. female.  HPI   Patient speaks Spanish and a interpreter was used to obtain HPI.  Patient with significant medical history of asthma, angioedema, prediabetic presents emerged part with chief complaint of left sided chest pain that started yesterday.  Patient states she feels a constant tightness in her left chest which she cannot tell if it is in her breasts or in her chest.  She states the pain is worse when she touches her chest or when she moves her left arm.  She denies associated symptoms like shortness of breath, becoming diaphoretic, paresthesias in her upper or lower extremities, dizziness or lightheadedness.  She denies any recent trauma to her left side, denies breast-feeding, worse noticing any rash lacerations or drainage from that area.  She has not tried any medication to help with the pain.  Has no cardiac history, history of DVTs, PEs, does not smoke, is not on hormone therapy, no recent surgeries or prolonged immobilization.  Patient denies fever, chills, shortness of breath, abdominal pain, nausea, vomiting, diarrhea, pedal edema. Past Medical History:  Diagnosis Date  . Angio-edema   . Asthma    Inhaler used 01/02/16  . Diverticulitis   . Nausea & vomiting 04/02/2017  . Pancreatitis   . Pre-diabetes   . Pyelonephritis   . Urticaria     Patient Active Problem List   Diagnosis Date Noted  . Intractable vomiting   . Acute diverticulitis 09/20/2018  . Cervical radiculopathy   . Left arm weakness   . Left-sided weakness 02/19/2018  . Weakness 02/19/2018  . Rash 02/09/2018  . Chest pain 02/08/2018  . Acute pancreatitis 04/02/2017  . Abdominal pain, acute, left upper quadrant 04/02/2017  . Intractable nausea and vomiting 04/02/2017  . Asthma in  adult 04/02/2017  . Obesity (BMI 30.0-34.9) 04/02/2017  . Fatty infiltration of liver 04/02/2017  . Gastroesophageal reflux disease without esophagitis   . Pre-diabetes 07/15/2015  . Diarrhea   . Hypokalemia   . Bacterial vaginosis   . Pyelonephritis 07/07/2015  . Abdominal pain, right upper quadrant   . Asthma 10/31/2014  . History of preterm delivery 10/22/2014  . Status post repeat low transverse cesarean section 09/06/2014  . Diverticulitis 09/10/2012    Past Surgical History:  Procedure Laterality Date  . CESAREAN SECTION    . CESAREAN SECTION N/A 2006  . CESAREAN SECTION N/A 09/03/2014   Procedure: CESAREAN SECTION;  Surgeon: Guss Bunde, MD;  Location: [facility];  Service: Obstetrics;  Laterality: N/A;  . CHOLECYSTECTOMY    . ESOPHAGOGASTRODUODENOSCOPY (EGD) WITH PROPOFOL Left 04/07/2017   Procedure: ESOPHAGOGASTRODUODENOSCOPY (EGD) WITH PROPOFOL;  Surgeon: Ronnette Juniper, MD;  Location: [facility];  Service: Gastroenterology;  Laterality: Left;  . TUBAL LIGATION       OB History    Gravida  5   Para  3   Term  1   Preterm  2   AB  2   Living  3     SAB  2   TAB      Ectopic      Multiple  1   Live Births  3           Family History  Problem Relation Age of Onset  . Hyperlipidemia Mother   .  Diabetes Father   . Ulcers Father   . Diabetes Maternal Uncle   . Diabetes Paternal Grandmother   . Asthma Daughter   . Allergic rhinitis Neg Hx   . Angioedema Neg Hx   . Eczema Neg Hx   . Immunodeficiency Neg Hx   . Urticaria Neg Hx     Social History   Tobacco Use  . Smoking status: Never Smoker  . Smokeless tobacco: Never Used  Vaping Use  . Vaping Use: Never used  Substance Use Topics  . Alcohol use: No  . Drug use: No    Home Medications Prior to Admission medications   Medication Sig Start Date End Date Taking? Authorizing Provider  albuterol (VENTOLIN HFA) 108 (90 Base) MCG/ACT inhaler Inhale 1-2 puffs into the lungs every 6 (six)  hours as needed for wheezing or shortness of breath.    [provider]  Hyoscyamine Sulfate SL (LEVSIN/SL) 0.125 MG SUBL Place 0.125 mg under the tongue every 8 (eight) hours as needed. 02/02/20   Charlesetta Shanks, MD  naproxen (NAPROSYN) 500 MG tablet Take 1 tablet (500 mg total) by mouth 2 (two) times daily. Patient not taking: Reported on 01/20/2020 12/27/19   Jaynee Eagles, PA-C  omeprazole (PRILOSEC) 20 MG capsule Take 1 capsule (20 mg total) by mouth daily. 02/02/20   Charlesetta Shanks, MD  ondansetron (ZOFRAN) 4 MG tablet Take 1 tablet (4 mg total) by mouth every 6 (six) hours. 01/20/20   Couture, Cortni S, PA-C  tiZANidine (ZANAFLEX) 4 MG tablet Take 1 tablet (4 mg total) by mouth every 8 (eight) hours as needed for muscle spasms. Patient not taking: Reported on 01/20/2020 12/27/19   Jaynee Eagles, PA-C  dicyclomine (BENTYL) 20 MG tablet Take 1 tablet (20 mg total) by mouth 2 (two) times daily. 11/27/19 12/27/19  Tedd Sias, PA  metoCLOPramide (REGLAN) 10 MG tablet Take 1 tablet (10 mg total) by mouth every 6 (six) hours. 09/28/19 11/25/19  Charlann Lange, PA-C    Allergies    Iodinated diagnostic agents, Lupine bean extract, and Morphine  Review of Systems   Review of Systems  Constitutional: Negative for chills and fever.  HENT: Negative for congestion, sore throat, tinnitus, trouble swallowing and voice change.   Eyes: Negative for visual disturbance.  Respiratory: Negative for cough and shortness of breath.   Cardiovascular: Positive for chest pain.  Gastrointestinal: Negative for abdominal pain, diarrhea, nausea and vomiting.  Genitourinary: Negative for enuresis, flank pain and frequency.  Musculoskeletal: Negative for back pain.  Skin: Negative for rash.  Neurological: Negative for dizziness and headaches.  Hematological: Does not bruise/bleed easily.    Physical Exam Updated Vital Signs BP (!) 117/98   Pulse 73   Temp 98.6 F (37 C) (Oral)   Resp 17   Ht 5' (1.524 m)    Wt 77.1 kg   LMP 05/09/2020 (Approximate)   SpO2 100%   BMI 33.20 kg/m   Physical Exam Vitals and nursing note reviewed.  Constitutional:      General: She is not in acute distress.    Appearance: She is not ill-appearing.  HENT:     Head: Normocephalic and atraumatic.     Nose: No congestion.     Mouth/Throat:     Mouth: Mucous membranes are moist.     Pharynx: Oropharynx is clear. No oropharyngeal exudate or posterior oropharyngeal erythema.  Eyes:     General: No scleral icterus. Cardiovascular:     Rate and Rhythm: Normal rate  and regular rhythm.     Pulses: Normal pulses.     Heart sounds: No murmur heard.  No friction rub. No gallop.      Comments: Patient chest was visualized, no gross otherwise noted, slight tenderness to palpation along her left pectoral muscle.  Pain worsen with left arm movement.  Patient had normal heart tones, normal rate and rhythm noted. Pulmonary:     Effort: No respiratory distress.     Breath sounds: No wheezing, rhonchi or rales.  Abdominal:     General: There is no distension.     Palpations: Abdomen is soft.     Tenderness: There is no abdominal tenderness. There is no right CVA tenderness, left CVA tenderness or guarding.  Musculoskeletal:        General: No swelling.     Right lower leg: No edema.     Left lower leg: No edema.  Skin:    General: Skin is warm and dry.     Capillary Refill: Capillary refill takes less than 2 seconds.     Findings: No rash.  Neurological:     General: No focal deficit present.     Mental Status: She is alert and oriented to person, place, and time.  Psychiatric:        Mood and Affect: Mood normal.     ED Results / Procedures / Treatments   Labs (all labs ordered are listed, but only abnormal results are displayed) Labs Reviewed  BASIC METABOLIC PANEL - Abnormal; Notable for the following components:      Result Value   Glucose, Bld 112 (*)    All other components within normal limits  CBC  WITH DIFFERENTIAL/PLATELET  URINALYSIS, ROUTINE W REFLEX MICROSCOPIC  TROPONIN I (HIGH SENSITIVITY)  TROPONIN I (HIGH SENSITIVITY)    EKG EKG Interpretation  Date/Time:  Monday June 06 2020 13:01:02 EDT Ventricular Rate:  71 PR Interval:  144 QRS Duration: 86 QT Interval:  398 QTC Calculation: 432 R Axis:   50 Text Interpretation: Normal sinus rhythm No significant change since last tracing Confirmed by Lajean Saver [phone]) on 06/06/2020 6:09:44 PM   Radiology DG Chest 2 View  Result Date: 06/06/2020 CLINICAL DATA:  Left-sided chest pain. EXAM: CHEST - 2 VIEW COMPARISON:  05/12/2019 FINDINGS: The heart size and mediastinal contours are within normal limits. Both lungs are clear. No pleural effusions. No pneumothorax. The visualized skeletal structures are unremarkable. Cholecystectomy clips. IMPRESSION: No acute cardiopulmonary disease. Electronically Signed   By: Margaretha Sheffield MD   On: 06/06/2020 13:52    Procedures Procedures (including critical care time)  Medications Ordered in ED Medications  acetaminophen (TYLENOL) tablet 1,000 mg (1,000 mg Oral Given 06/06/20 1753)    ED Course  I have reviewed the triage vital signs and the nursing notes.  Pertinent labs & imaging results that were available during my care of the patient were reviewed by me and considered in my medical decision making (see chart for details).    MDM Rules/Calculators/A&P                          I have personally reviewed all imaging, labs and have interpreted them.  Patient presents to the emergency room with chief complaint of left-sided left pain that started yesterday.  She was alert and oriented, did not be in acute stress, vital signs reassuring.  On exam she had reproducible chest pain, worsens with left arm movement, lung sounds  are clear bilaterally, abdomen soft nontender to palpation, no pedal edema noted.  Will order screening labs, chest x-ray, EKG for further  evaluation.  CBC does not show leukocytosis, no signs of anemia.  BMP does not show electrolyte abnormalities, no signs of metabolic acidosis, no AKI, no anion gap.  Initial troponin less than 2. Chest x-ray does not show any acute abnormalities, EKG was sinus rhythm no signs of ischemia no ST elevation or depression noted.  I have low suspicion patient suffering from acute cardiac abnormality as history is atypical of MI, chest pain is reproducible upon palpation, worsens with left arm movement, EKG does not show ischemia, troponin less than 2..  Low suspicion for PE as patient denies pleuritic chest pain, shortness of breath, no pedal edema, she is PERC negative.  Low suspicion for AAA as she has low risk factors, no signs of hypoperfusion.  low suspicion patient suffering from systemic infection as patient is nontoxic-appearing, vital signs reassuring, no obvious source infection on exam, no leukocytosis seen on CBC.  I suspect patient suffering from muscle strain as it hurts with palpation as well as left arm movement.  But differential diagnosis includes costochondritis, acid reflux, anxiety.  Will recommend over-the-counter pain medications and following up with her PCP for further evaluation.  Patient appears to be resting comfortably, vital signs reassuring, no indication for hospital admission.  Patient was discussed with attending who agrees assessment plan.  Patient is given at home care and strict return precautions patient verbalized that she understood and agreed to plan.   Final Clinical Impression(s) / ED Diagnoses Final diagnoses:  Atypical chest pain    Rx / DC Orders ED Discharge Orders    None       Marcello Fennel, PA-C 06/06/20 1841    Little, Wenda Overland, MD 06/06/20 2032

## 2020-06-08 ENCOUNTER — Encounter (HOSPITAL_COMMUNITY): Payer: Self-pay

## 2020-06-08 ENCOUNTER — Emergency Department (HOSPITAL_COMMUNITY)
Admission: EM | Admit: 2020-06-08 | Discharge: 2020-06-09 | Disposition: A | Discharge status: Home / Self Care | Payer: Self-pay | Attending: Emergency Medicine | Admitting: Emergency Medicine

## 2020-06-08 ENCOUNTER — Emergency Department (HOSPITAL_COMMUNITY): Payer: Self-pay

## 2020-06-08 ENCOUNTER — Other Ambulatory Visit: Payer: Self-pay

## 2020-06-08 DIAGNOSIS — R112 Nausea with vomiting, unspecified: Secondary | ICD-10-CM | POA: Insufficient documentation

## 2020-06-08 DIAGNOSIS — J45909 Unspecified asthma, uncomplicated: Secondary | ICD-10-CM | POA: Insufficient documentation

## 2020-06-08 DIAGNOSIS — R109 Unspecified abdominal pain: Secondary | ICD-10-CM

## 2020-06-08 DIAGNOSIS — R319 Hematuria, unspecified: Secondary | ICD-10-CM | POA: Insufficient documentation

## 2020-06-08 DIAGNOSIS — Z79899 Other long term (current) drug therapy: Secondary | ICD-10-CM | POA: Insufficient documentation

## 2020-06-08 LAB — URINALYSIS, ROUTINE W REFLEX MICROSCOPIC
Bilirubin Urine: NEGATIVE
Glucose, UA: NEGATIVE mg/dL
Ketones, ur: NEGATIVE mg/dL
Nitrite: NEGATIVE
Protein, ur: NEGATIVE mg/dL
RBC / HPF: >50 RBC/hpf — ABNORMAL HIGH (ref 0–5)
Specific Gravity, Urine: 1.023 (ref 1.005–1.030)
pH: 5 (ref 5.0–8.0)

## 2020-06-08 LAB — I-STAT BETA HCG BLOOD, ED (MC, WL, AP ONLY): I-stat hCG, quantitative: <5 m[IU]/mL (ref <5)

## 2020-06-08 LAB — CBC
HCT: 36.7 % (ref 36.0–46.0)
Hemoglobin: 12.1 g/dL (ref 12.0–15.0)
MCH: 30.4 pg (ref 26.0–34.0)
MCHC: 33 g/dL (ref 30.0–36.0)
MCV: 92.2 fL (ref 80.0–100.0)
Platelets: 273 10*3/uL (ref 150–400)
RBC: 3.98 MIL/uL (ref 3.87–5.11)
RDW: 12.3 % (ref 11.5–15.5)
WBC: 11 10*3/uL — ABNORMAL HIGH (ref 4.0–10.5)
nRBC: 0 % (ref 0.0–0.2)

## 2020-06-08 LAB — COMPREHENSIVE METABOLIC PANEL
ALT: 17 U/L (ref 0–44)
AST: 19 U/L (ref 15–41)
Albumin: 4 g/dL (ref 3.5–5.0)
Alkaline Phosphatase: 91 U/L (ref 38–126)
Anion gap: 10 (ref 5–15)
BUN: 20 mg/dL (ref 6–20)
CO2: 24 mmol/L (ref 22–32)
Calcium: 8.8 mg/dL — ABNORMAL LOW (ref 8.9–10.3)
Chloride: 104 mmol/L (ref 98–111)
Creatinine, Ser: 0.66 mg/dL (ref 0.44–1.00)
GFR calc Af Amer: >60 mL/min (ref >60)
GFR calc non Af Amer: >60 mL/min (ref >60)
Glucose, Bld: 133 mg/dL — ABNORMAL HIGH (ref 70–99)
Potassium: 3.8 mmol/L (ref 3.5–5.1)
Sodium: 138 mmol/L (ref 135–145)
Total Bilirubin: 0.3 mg/dL (ref 0.3–1.2)
Total Protein: 7.3 g/dL (ref 6.5–8.1)

## 2020-06-08 LAB — LIPASE, BLOOD: Lipase: 37 U/L (ref 11–51)

## 2020-06-08 MED ORDER — KETOROLAC TROMETHAMINE 30 MG/ML IJ SOLN
30.0000 mg | Freq: Once | INTRAMUSCULAR | Status: AC
  Administered 2020-06-09: 30 mg via INTRAVENOUS
  Filled 2020-06-08: qty 1

## 2020-06-08 MED ORDER — ONDANSETRON HCL 4 MG/2ML IJ SOLN
4.0000 mg | Freq: Once | INTRAMUSCULAR | Status: AC
  Administered 2020-06-09: 4 mg via INTRAVENOUS
  Filled 2020-06-08: qty 2

## 2020-06-08 NOTE — ED Notes (Signed)
Labeled urine specimen and culture sent to lab. [facility] 

## 2020-06-08 NOTE — ED Triage Notes (Signed)
Pt c/o vomiting, chills, and RLQ abdominal pain radiating to right knee.

## 2020-06-09 MED ORDER — FAMOTIDINE IN NACL 20-0.9 MG/50ML-% IV SOLN
20.0000 mg | INTRAVENOUS | Status: AC
  Administered 2020-06-09: 20 mg via INTRAVENOUS
  Filled 2020-06-09: qty 50

## 2020-06-09 MED ORDER — HALOPERIDOL LACTATE 5 MG/ML IJ SOLN
2.0000 mg | Freq: Once | INTRAMUSCULAR | Status: AC
  Administered 2020-06-09: 2 mg via INTRAVENOUS
  Filled 2020-06-09: qty 1

## 2020-06-09 MED ORDER — ONDANSETRON HCL 4 MG PO TABS: 4.0000 mg | ORAL_TABLET | Freq: Four times a day (QID) | ORAL | 0 refills | Status: DC

## 2020-06-09 NOTE — ED Notes (Signed)
Pt difficult IV start. Attempted by 2 RNs unsuccessfully. IV team consulted

## 2020-06-09 NOTE — ED Notes (Signed)
Attempted to start PIV unsuccessful. Agricultural consultant to attempt

## 2020-06-09 NOTE — Discharge Instructions (Signed)
Your evaluation in the emergency department was reassuring. We recommend follow-up with a primary care doctor. Return for new or concerning symptoms.

## 2020-06-09 NOTE — ED Provider Notes (Signed)
[facility] DEPT Provider Note   CSN: [id number] Arrival date & time: 06/08/20  2101     History Chief Complaint  Patient presents with  . Abdominal Pain    [facility] [name] is a 34 y.o. female.  35 year old female with a history of angioedema, prediabetes, pyelonephritis presents to the emergency department for evaluation of abdominal pain.  She states the abdominal pain has been present in her right mid abdomen and radiates to her right thigh.  Has been constant and began around 1500 today.  Symptoms worsened after 3 hours when she stood up to walk.  She notes aggravation of her pain with ambulation.  She has had 5 episodes of vomiting since her pain began.  Further reporting associated chills.  No sick contacts, fever, hematemesis, diarrhea, melena, hematochezia, dysuria or hematuria, vaginal bleeding or discharge.  She is married with 1 sexual partner and denies concern for STDs.  Abdominal surgical history significant for tubal ligation, cesarean section, and cholecystectomy.  The history is provided by the patient. No language interpreter was used.  Abdominal Pain      Past Medical History:  Diagnosis Date  . Angio-edema   . Asthma    Inhaler used 01/02/16  . Diverticulitis   . Nausea & vomiting 04/02/2017  . Pancreatitis   . Pre-diabetes   . Pyelonephritis   . Urticaria     Patient Active Problem List   Diagnosis Date Noted  . Intractable vomiting   . Acute diverticulitis 09/20/2018  . Cervical radiculopathy   . Left arm weakness   . Left-sided weakness 02/19/2018  . Weakness 02/19/2018  . Rash 02/09/2018  . Chest pain 02/08/2018  . Acute pancreatitis 04/02/2017  . Abdominal pain, acute, left upper quadrant 04/02/2017  . Intractable nausea and vomiting 04/02/2017  . Asthma in adult 04/02/2017  . Obesity (BMI 30.0-34.9) 04/02/2017  . Fatty infiltration of liver 04/02/2017  . Gastroesophageal reflux disease without esophagitis     . Pre-diabetes 07/15/2015  . Diarrhea   . Hypokalemia   . Bacterial vaginosis   . Pyelonephritis 07/07/2015  . Abdominal pain, right upper quadrant   . Asthma 10/31/2014  . History of preterm delivery 10/22/2014  . Status post repeat low transverse cesarean section 09/06/2014  . Diverticulitis 09/10/2012    Past Surgical History:  Procedure Laterality Date  . CESAREAN SECTION    . CESAREAN SECTION N/A 2006  . CESAREAN SECTION N/A 09/03/2014   Procedure: CESAREAN SECTION;  Surgeon: Guss Bunde, MD;  Location: [facility];  Service: Obstetrics;  Laterality: N/A;  . CHOLECYSTECTOMY    . ESOPHAGOGASTRODUODENOSCOPY (EGD) WITH PROPOFOL Left 04/07/2017   Procedure: ESOPHAGOGASTRODUODENOSCOPY (EGD) WITH PROPOFOL;  Surgeon: Ronnette Juniper, MD;  Location: [facility];  Service: Gastroenterology;  Laterality: Left;  . TUBAL LIGATION       OB History    Gravida  5   Para  3   Term  1   Preterm  2   AB  2   Living  3     SAB  2   TAB      Ectopic      Multiple  1   Live Births  3           Family History  Problem Relation Age of Onset  . Hyperlipidemia Mother   . Diabetes Father   . Ulcers Father   . Diabetes Maternal Uncle   . Diabetes Paternal Grandmother   . Asthma Daughter   .  Allergic rhinitis Neg Hx   . Angioedema Neg Hx   . Eczema Neg Hx   . Immunodeficiency Neg Hx   . Urticaria Neg Hx     Social History   Tobacco Use  . Smoking status: Never Smoker  . Smokeless tobacco: Never Used  Vaping Use  . Vaping Use: Never used  Substance Use Topics  . Alcohol use: No  . Drug use: No    Home Medications Prior to Admission medications   Medication Sig Start Date End Date Taking? Authorizing Provider  albuterol (VENTOLIN HFA) 108 (90 Base) MCG/ACT inhaler Inhale 1-2 puffs into the lungs every 6 (six) hours as needed for wheezing or shortness of breath.   Yes [provider]  omeprazole (PRILOSEC) 20 MG capsule Take 1 capsule (20 mg total) by  mouth daily. 02/02/20  Yes Pfeiffer, Jeannie Done, MD  Hyoscyamine Sulfate SL (LEVSIN/SL) 0.125 MG SUBL Place 0.125 mg under the tongue every 8 (eight) hours as needed. 02/02/20   Charlesetta Shanks, MD  naproxen (NAPROSYN) 500 MG tablet Take 1 tablet (500 mg total) by mouth 2 (two) times daily. Patient not taking: Reported on 01/20/2020 12/27/19   Jaynee Eagles, PA-C  ondansetron (ZOFRAN) 4 MG tablet Take 1 tablet (4 mg total) by mouth every 6 (six) hours. 06/09/20   Antonietta Breach, PA-C  tiZANidine (ZANAFLEX) 4 MG tablet Take 1 tablet (4 mg total) by mouth every 8 (eight) hours as needed for muscle spasms. Patient not taking: Reported on 01/20/2020 12/27/19   Jaynee Eagles, PA-C  dicyclomine (BENTYL) 20 MG tablet Take 1 tablet (20 mg total) by mouth 2 (two) times daily. 11/27/19 12/27/19  Tedd Sias, PA  metoCLOPramide (REGLAN) 10 MG tablet Take 1 tablet (10 mg total) by mouth every 6 (six) hours. 09/28/19 11/25/19  Charlann Lange, PA-C    Allergies    Iodinated diagnostic agents, Lupine bean extract, and Morphine  Review of Systems   Review of Systems  Gastrointestinal: Positive for abdominal pain.  Ten systems reviewed and are negative for acute change, except as noted in the HPI.    Physical Exam Updated Vital Signs BP 93/77 (BP Location: Left Arm)   Pulse 76   Temp 98.4 F (36.9 C) (Oral)   Resp 18   Ht 5' (1.524 m)   Wt 79.4 kg   SpO2 100%   BMI 34.18 kg/m   Physical Exam Vitals and nursing note reviewed.  Constitutional:      General: She is not in acute distress.    Appearance: She is well-developed. She is not diaphoretic.     Comments: Patient appears uncomfortable, but nontoxic.  HENT:     Head: Normocephalic and atraumatic.  Eyes:     General: No scleral icterus.    Conjunctiva/sclera: Conjunctivae normal.  Cardiovascular:     Rate and Rhythm: Normal rate and regular rhythm.     Pulses: Normal pulses.  Pulmonary:     Effort: Pulmonary effort is normal. No respiratory distress.       Comments: Respirations even and unlabored Abdominal:     Palpations: Abdomen is soft. There is no mass.     Tenderness: There is abdominal tenderness. There is no guarding.     Comments: Obese abdomen with mild R mid abdominal TTP. No guarding or peritoneal signs.  Musculoskeletal:        General: Normal range of motion.     Cervical back: Normal range of motion.  Skin:    General: Skin is  warm and dry.     Coloration: Skin is not pale.     Findings: No erythema or rash.  Neurological:     Mental Status: She is alert and oriented to person, place, and time.     Coordination: Coordination normal.  Psychiatric:        Behavior: Behavior normal.     ED Results / Procedures / Treatments   Labs (all labs ordered are listed, but only abnormal results are displayed) Labs Reviewed  COMPREHENSIVE METABOLIC PANEL - Abnormal; Notable for the following components:      Result Value   Glucose, Bld 133 (*)    Calcium 8.8 (*)    All other components within normal limits  CBC - Abnormal; Notable for the following components:   WBC 11.0 (*)    All other components within normal limits  URINALYSIS, ROUTINE W REFLEX MICROSCOPIC - Abnormal; Notable for the following components:   Hgb urine dipstick LARGE (*)    Leukocytes,Ua MODERATE (*)    RBC / HPF >50 (*)    Bacteria, UA RARE (*)    All other components within normal limits  LIPASE, BLOOD  I-STAT BETA HCG BLOOD, ED ([facility], [facility], [facility] ONLY)    EKG None  Radiology CT Renal Stone Study  Result Date: 06/08/2020 CLINICAL DATA:  Right lower quadrant pain radiating to right knee, vomiting, chills. Intravenous contrast allergy. Lipase 37. White blood cell count 11. EXAM: CT ABDOMEN AND PELVIS WITHOUT CONTRAST TECHNIQUE: Multidetector CT imaging of the abdomen and pelvis was performed following the standard protocol without IV contrast. COMPARISON:  CT renal 01/20/2020.  CT abdomen pelvis 11/27/2019. FINDINGS: Lower chest: Bilateral lower lobe  subsegmental atelectasis. Otherwise no acute abnormality. Hepatobiliary: Similar-appearing punctate calcification within the hepatic dome likely represents sequelae of prior granulomatous disease. Otherwise no focal liver abnormality is seen. Status post cholecystectomy. No biliary dilatation. Pancreas: Unremarkable. No pancreatic ductal dilatation or surrounding inflammatory changes. Spleen: Normal in size without focal abnormality. Adrenals/Urinary Tract: No adrenal nodule bilaterally. No nephrolithiasis, no hydronephrosis, and no contour-deforming renal mass. No ureterolithiasis or hydroureter. The urinary bladder is unremarkable. No perivesicular fat stranding. Stomach/Bowel: Stomach is within normal limits. Appendix appears normal. No evidence of bowel wall thickening, distention, or inflammatory changes. Scattered colonic diverticulosis. Vascular/Lymphatic: No significant vascular findings are present. No enlarged abdominal or pelvic lymph nodes. Reproductive: Uterus and bilateral adnexa are unremarkable. Metallic clips within the pelvis consistent with tubal ligation. Other: No intraperitoneal free fluid. No intraperitoneal free gas. No organized fluid collection. Musculoskeletal: Tiny fat containing umbilical hernia. Healed anterior abdominal incision. No suspicious lytic or blastic osseous lesions. No acute displaced fracture. Multilevel degenerative changes of the spine. IMPRESSION: 1. No intra-abdominal intrapelvic abnormality T to explain etiology of patient's symptoms. 2. Scattered colonic diverticulosis with no acute diverticulitis. 3. Normal appendix. Electronically Signed   By: Iven Finn M.D.   On: 06/08/2020 23:56    Procedures Procedures (including critical care time)  Medications Ordered in ED Medications  ketorolac (TORADOL) 30 MG/ML injection 30 mg (30 mg Intravenous Given 06/09/20 0034)  ondansetron (ZOFRAN) injection 4 mg (4 mg Intravenous Given 06/09/20 0032)  famotidine  (PEPCID) IVPB 20 mg premix (20 mg Intravenous New Bag/Given 06/09/20 0351)  haloperidol lactate (HALDOL) injection 2 mg (2 mg Intravenous Given 06/09/20 0352)    ED Course  I have reviewed the triage vital signs and the nursing notes.  Pertinent labs & imaging results that were available during my care of the patient were  reviewed by me and considered in my medical decision making (see chart for details).  Clinical Course as of Jun 10 439  Thu Jun 09, 2020  0111 Patient appears more comfortable.  Communicated results of CT scan.  Patient verbalizes understanding.  Complains of persistent nausea.  Will give Haldol and Pepcid.  Anticipate discharge when nausea better managed.   [KH]    Clinical Course User Index [KH] Beverely Pace   MDM Rules/Calculators/A&P                          34 year old female presents to the emergency department for evaluation of abdominal pain, nausea, vomiting. Seen rather frequently in the ED for similar complaints; prior hx of nausea and vomiting. Primarily seen spitting up into an emesis bag rather than vomiting stomach contents. Primarily tender in the right mid abdomen. No peritoneal signs appreciated on abdominal exam.  Plan initially was for ultrasound, but unable to obtain this imaging given the hour. Decision was made to proceed with abdominal CT. This, specifically, shows no acute intra-abdominal pathology. No evidence of kidney stone given presence of hematuria. Ovarian cyst considered, but imaging with comments of normal adnexa. She has no fever, leukocytosis, criteria for SIRS/sepsis. Symptoms better controlled with medications given on arrival.  Do not feel further emergent work-up is presently indicated. Have encouraged the patient to follow-up with her primary doctor.  Return precautions discussed and provided. Patient discharged in stable condition with no unaddressed concerns.   Final Clinical Impression(s) / ED Diagnoses Final diagnoses:    Abdominal pain, unspecified abdominal location  Hematuria, unspecified type    Rx / DC Orders ED Discharge Orders         Ordered    ondansetron (ZOFRAN) 4 MG tablet  Every 6 hours        06/09/20 0408           Antonietta Breach, PA-C 06/09/20 0440    Molpus, Jenny Reichmann, MD 06/09/20 [phone]

## 2020-06-29 ENCOUNTER — Other Ambulatory Visit: Payer: Self-pay

## 2020-06-29 ENCOUNTER — Ambulatory Visit (HOSPITAL_COMMUNITY): Admission: EM | Admit: 2020-06-29 | Discharge: 2020-06-29 | Discharge status: Against Medical Advice | Payer: Self-pay

## 2020-06-29 NOTE — ED Notes (Signed)
Pt called once in lobby and once by phone with no answer.

## 2020-07-15 ENCOUNTER — Other Ambulatory Visit: Payer: Self-pay

## 2020-07-15 ENCOUNTER — Emergency Department (HOSPITAL_COMMUNITY)
Admission: EM | Admit: 2020-07-15 | Discharge: 2020-07-15 | Disposition: A | Discharge status: Home / Self Care | Payer: Self-pay | Attending: Emergency Medicine | Admitting: Emergency Medicine

## 2020-07-15 DIAGNOSIS — J45909 Unspecified asthma, uncomplicated: Secondary | ICD-10-CM | POA: Insufficient documentation

## 2020-07-15 DIAGNOSIS — N946 Dysmenorrhea, unspecified: Secondary | ICD-10-CM | POA: Insufficient documentation

## 2020-07-15 LAB — URINALYSIS, ROUTINE W REFLEX MICROSCOPIC
Bilirubin Urine: NEGATIVE
Glucose, UA: NEGATIVE mg/dL
Hgb urine dipstick: NEGATIVE
Ketones, ur: NEGATIVE mg/dL
Leukocytes,Ua: NEGATIVE
Nitrite: NEGATIVE
Protein, ur: NEGATIVE mg/dL
Specific Gravity, Urine: 1.018 (ref 1.005–1.030)
pH: 6 (ref 5.0–8.0)

## 2020-07-15 LAB — CBC WITH DIFFERENTIAL/PLATELET
Abs Immature Granulocytes: 0.03 10*3/uL (ref 0.00–0.07)
Basophils Absolute: 0 10*3/uL (ref 0.0–0.1)
Basophils Relative: 1 %
Eosinophils Absolute: 0.1 10*3/uL (ref 0.0–0.5)
Eosinophils Relative: 2 %
HCT: 36.3 % (ref 36.0–46.0)
Hemoglobin: 12.1 g/dL (ref 12.0–15.0)
Immature Granulocytes: 0 %
Lymphocytes Relative: 39 %
Lymphs Abs: 3 10*3/uL (ref 0.7–4.0)
MCH: 30.4 pg (ref 26.0–34.0)
MCHC: 33.3 g/dL (ref 30.0–36.0)
MCV: 91.2 fL (ref 80.0–100.0)
Monocytes Absolute: 0.6 10*3/uL (ref 0.1–1.0)
Monocytes Relative: 7 %
Neutro Abs: 4 10*3/uL (ref 1.7–7.7)
Neutrophils Relative %: 51 %
Platelets: 266 10*3/uL (ref 150–400)
RBC: 3.98 MIL/uL (ref 3.87–5.11)
RDW: 12.4 % (ref 11.5–15.5)
WBC: 7.7 10*3/uL (ref 4.0–10.5)
nRBC: 0 % (ref 0.0–0.2)

## 2020-07-15 LAB — BASIC METABOLIC PANEL
Anion gap: 9 (ref 5–15)
BUN: 12 mg/dL (ref 6–20)
CO2: 25 mmol/L (ref 22–32)
Calcium: 8.8 mg/dL — ABNORMAL LOW (ref 8.9–10.3)
Chloride: 106 mmol/L (ref 98–111)
Creatinine, Ser: 0.66 mg/dL (ref 0.44–1.00)
GFR, Estimated: >60 mL/min (ref >60)
Glucose, Bld: 103 mg/dL — ABNORMAL HIGH (ref 70–99)
Potassium: 3.6 mmol/L (ref 3.5–5.1)
Sodium: 140 mmol/L (ref 135–145)

## 2020-07-15 LAB — WET PREP, GENITAL
Clue Cells Wet Prep HPF POC: NONE SEEN
Sperm: NONE SEEN
Trich, Wet Prep: NONE SEEN
Yeast Wet Prep HPF POC: NONE SEEN

## 2020-07-15 LAB — PREGNANCY, URINE: Preg Test, Ur: NEGATIVE

## 2020-07-15 MED ORDER — KETOROLAC TROMETHAMINE 30 MG/ML IJ SOLN: 30.0000 mg | Freq: Once | INTRAMUSCULAR | Status: DC

## 2020-07-15 MED ORDER — ONDANSETRON HCL 4 MG/2ML IJ SOLN: 4.0000 mg | Freq: Once | INTRAMUSCULAR | Status: DC

## 2020-07-15 MED ORDER — ONDANSETRON 4 MG PO TBDP: 4.0000 mg | ORAL_TABLET | Freq: Three times a day (TID) | ORAL | 0 refills | Status: DC | PRN

## 2020-07-15 MED ORDER — ONDANSETRON 4 MG PO TBDP
4.0000 mg | ORAL_TABLET | Freq: Once | ORAL | Status: AC
  Administered 2020-07-15: 4 mg via ORAL
  Filled 2020-07-15: qty 1

## 2020-07-15 MED ORDER — NAPROXEN 500 MG PO TABS: 500.0000 mg | ORAL_TABLET | Freq: Two times a day (BID) | ORAL | 0 refills | Status: DC | PRN

## 2020-07-15 MED ORDER — KETOROLAC TROMETHAMINE 60 MG/2ML IM SOLN
60.0000 mg | Freq: Once | INTRAMUSCULAR | Status: AC
  Administered 2020-07-15: 60 mg via INTRAMUSCULAR
  Filled 2020-07-15: qty 2

## 2020-07-15 NOTE — Discharge Instructions (Addendum)
Your laboratory work-up and physical exam was reassuring.  I have lower suspicion for emergent pathology.  I prescribed you NSAIDs as well as Zofran ODT for nausea.  The Zofran will dissolve underneath your tongue.  Please discontinue the naproxen (NSAIDs) should you become pregnant or breast-feeding.  I have also placed a consult to transitions of care/case management who will reach out to you to assist you with insurance and outpatient follow-up.  Return to the ED or seek immediate medical attention should you experience any new or worsening symptoms.  Su anlisis de laboratorio y su examen fsico fueron reconfortantes. Tengo menos sospechas de Tour manager.  Le recet AINE y Zofran ODT para las nuseas. El Zofran se disolver debajo de tu lengua. Suspenda el naproxeno (AINE) si queda embarazada o amamantando.  Tambin he realizado [employer] para las transiciones de la atencin / administracin de Radio broadcast assistant, que se Cytogeneticist con usted para ayudarlo con el seguro y el seguimiento ambulatorio.  Regrese al servicio de urgencias o busque atencin mdica inmediata si experimenta algn sntoma nuevo o que empeora.

## 2020-07-15 NOTE — ED Provider Notes (Signed)
[facility] DEPT Provider Note   CSN: [id number] Arrival date & time: 07/15/20  0844     History Chief Complaint  Patient presents with  . Abdominal Pain    [name] is a 34 y.o. female with PMH of GERD, diverticulitis, pancreatitis who presents the ED with complaints of lower abdominal pain and nausea symptoms with onset of her period yesterday.  Patient was evaluated approximately 1 month ago 06/08/2020 for abdominal pain with radiation to her right thigh.  She received a comprehensive work-up including CT imaging which revealed no acute intra-abdominal abnormality.  Patient is s/p tubal ligation.  On my examination, patient tells me that she is experiencing severe lower abdominal pain.  Patient reports that her menses began 3 to 4 days ago and have already resolved.  She states this feels comparable to her prior dysmenorrhea, however much worse.  She also states that she has had diverticulitis in the past that feels similar.  She has been endorsing chills at home, no documented fevers.  She also has nausea, but without any emesis.  She denies any urinary symptoms, vaginal discharge, or changes in bowel habits.  She is married and has low suspicion for STI.  Patient reports that she was in the process of getting established with a primary care provider at [facility], but cannot receive her orange card due to inability to prove household income as her husband works under the table.  HPI     Past Medical History:  Diagnosis Date  . Angio-edema   . Asthma    Inhaler used 01/02/16  . Diverticulitis   . Nausea & vomiting 04/02/2017  . Pancreatitis   . Pre-diabetes   . Pyelonephritis   . Urticaria     Patient Active Problem List   Diagnosis Date Noted  . Intractable vomiting   . Acute diverticulitis 09/20/2018  . Cervical radiculopathy   . Left arm weakness   . Left-sided weakness 02/19/2018  . Weakness  02/19/2018  . Rash 02/09/2018  . Chest pain 02/08/2018  . Acute pancreatitis 04/02/2017  . Abdominal pain, acute, left upper quadrant 04/02/2017  . Intractable nausea and vomiting 04/02/2017  . Asthma in adult 04/02/2017  . Obesity (BMI 30.0-34.9) 04/02/2017  . Fatty infiltration of liver 04/02/2017  . Gastroesophageal reflux disease without esophagitis   . Pre-diabetes 07/15/2015  . Diarrhea   . Hypokalemia   . Bacterial vaginosis   . Pyelonephritis 07/07/2015  . Abdominal pain, right upper quadrant   . Asthma 10/31/2014  . History of preterm delivery 10/22/2014  . Status post repeat low transverse cesarean section 09/06/2014  . Diverticulitis 09/10/2012    Past Surgical History:  Procedure Laterality Date  . CESAREAN SECTION    . CESAREAN SECTION N/A 2006  . CESAREAN SECTION N/A 09/03/2014   Procedure: CESAREAN SECTION;  Surgeon: Guss Bunde, MD;  Location: [facility];  Service: Obstetrics;  Laterality: N/A;  . CHOLECYSTECTOMY    . ESOPHAGOGASTRODUODENOSCOPY (EGD) WITH PROPOFOL Left 04/07/2017   Procedure: ESOPHAGOGASTRODUODENOSCOPY (EGD) WITH PROPOFOL;  Surgeon: Ronnette Juniper, MD;  Location: [facility];  Service: Gastroenterology;  Laterality: Left;  . TUBAL LIGATION       OB History    Gravida  5   Para  3   Term  1   Preterm  2   AB  2   Living  3     SAB  2   TAB  Ectopic      Multiple  1   Live Births  3           Family History  Problem Relation Age of Onset  . Hyperlipidemia Mother   . Diabetes Father   . Ulcers Father   . Diabetes Maternal Uncle   . Diabetes Paternal Grandmother   . Asthma Daughter   . Allergic rhinitis Neg Hx   . Angioedema Neg Hx   . Eczema Neg Hx   . Immunodeficiency Neg Hx   . Urticaria Neg Hx     Social History   Tobacco Use  . Smoking status: Never Smoker  . Smokeless tobacco: Never Used  Vaping Use  . Vaping Use: Never used  Substance Use Topics  . Alcohol use: No  . Drug use: No    Home  Medications Prior to Admission medications   Medication Sig Start Date End Date Taking? Authorizing Provider  albuterol (VENTOLIN HFA) 108 (90 Base) MCG/ACT inhaler Inhale 1-2 puffs into the lungs every 6 (six) hours as needed for wheezing or shortness of breath.    [provider]  Hyoscyamine Sulfate SL (LEVSIN/SL) 0.125 MG SUBL Place 0.125 mg under the tongue every 8 (eight) hours as needed. 02/02/20   Charlesetta Shanks, MD  naproxen (NAPROSYN) 500 MG tablet Take 1 tablet (500 mg total) by mouth 2 (two) times daily as needed for moderate pain. 07/15/20   Corena Herter, PA-C  omeprazole (PRILOSEC) 20 MG capsule Take 1 capsule (20 mg total) by mouth daily. 02/02/20   Charlesetta Shanks, MD  ondansetron (ZOFRAN ODT) 4 MG disintegrating tablet Take 1 tablet (4 mg total) by mouth every 8 (eight) hours as needed for nausea or vomiting. 07/15/20   Corena Herter, PA-C  ondansetron (ZOFRAN) 4 MG tablet Take 1 tablet (4 mg total) by mouth every 6 (six) hours. 06/09/20   Antonietta Breach, PA-C  tiZANidine (ZANAFLEX) 4 MG tablet Take 1 tablet (4 mg total) by mouth every 8 (eight) hours as needed for muscle spasms. Patient not taking: Reported on 01/20/2020 12/27/19   Jaynee Eagles, PA-C  dicyclomine (BENTYL) 20 MG tablet Take 1 tablet (20 mg total) by mouth 2 (two) times daily. 11/27/19 12/27/19  Tedd Sias, PA  metoCLOPramide (REGLAN) 10 MG tablet Take 1 tablet (10 mg total) by mouth every 6 (six) hours. 09/28/19 11/25/19  Charlann Lange, PA-C    Allergies    Iodinated diagnostic agents, Lupine bean extract, and Morphine  Review of Systems   Review of Systems  All other systems reviewed and are negative.   Physical Exam Updated Vital Signs BP 129/78 (BP Location: Right Arm)   Pulse 69   Temp 98.1 F (36.7 C) (Oral)   Resp 18   Ht 5' (1.524 m)   Wt 80 kg   LMP 07/14/2020   SpO2 99%   BMI 34.44 kg/m   Physical Exam Vitals and nursing note reviewed. Exam conducted with a chaperone present.   Constitutional:      Appearance: Normal appearance.  HENT:     Head: Normocephalic and atraumatic.  Eyes:     General: No scleral icterus.    Conjunctiva/sclera: Conjunctivae normal.  Cardiovascular:     Rate and Rhythm: Normal rate and regular rhythm.     Pulses: Normal pulses.     Heart sounds: Normal heart sounds.  Pulmonary:     Effort: Pulmonary effort is normal. No respiratory distress.     Breath sounds: Normal  breath sounds.  Abdominal:     General: Abdomen is flat. There is no distension.     Palpations: Abdomen is soft.     Tenderness: There is abdominal tenderness. There is no guarding.     Comments: Soft, nondistended.  TTP in LLQ and suprapubic region.  No significant TTP elsewhere.  No overlying skin changes.  No guarding.  Genitourinary:    Comments: Speculum exam: Nonfriable cervix.  Pink.  Mild bleeding from cervical os.  No significant discharge in vaginal vault. Bimanual exam: No CMT or adnexal tenderness.  No obvious masses appreciated. Musculoskeletal:     Cervical back: Normal range of motion.     Right lower leg: No edema.     Left lower leg: No edema.  Skin:    General: Skin is dry.     Capillary Refill: Capillary refill takes less than 2 seconds.  Neurological:     Mental Status: She is alert and oriented to person, place, and time.     GCS: GCS eye subscore is 4. GCS verbal subscore is 5. GCS motor subscore is 6.  Psychiatric:        Mood and Affect: Mood normal.        Behavior: Behavior normal.        Thought Content: Thought content normal.     ED Results / Procedures / Treatments   Labs (all labs ordered are listed, but only abnormal results are displayed) Labs Reviewed  WET PREP, GENITAL - Abnormal; Notable for the following components:      Result Value   WBC, Wet Prep HPF POC FEW (*)    All other components within normal limits  BASIC METABOLIC PANEL - Abnormal; Notable for the following components:   Glucose, Bld 103 (*)    Calcium  8.8 (*)    All other components within normal limits  URINALYSIS, ROUTINE W REFLEX MICROSCOPIC  PREGNANCY, URINE  CBC WITH DIFFERENTIAL/PLATELET  CBC WITH DIFFERENTIAL/PLATELET  GC/CHLAMYDIA PROBE AMP ([place]) NOT AT [facility]    EKG None  Radiology No results found.  Procedures Procedures (including critical care time)  Medications Ordered in ED Medications  ondansetron (ZOFRAN-ODT) disintegrating tablet 4 mg (4 mg Oral Given 07/15/20 1028)  ketorolac (TORADOL) injection 60 mg (60 mg Intramuscular Given 07/15/20 1027)    ED Course  I have reviewed the triage vital signs and the nursing notes.  Pertinent labs & imaging results that were available during my care of the patient were reviewed by me and considered in my medical decision making (see chart for details).    MDM Rules/Calculators/A&P                          Patient's history and physical exam is suggestive of dysmenorrhea versus diverticulitis.  Will obtain basic laboratory work-up and perform pelvic exam.  Labs CBC: No anemia or leukocytosis concerning for infection. BMP: Unremarkable. UA: No evidence of infection. Wet prep: Few WBC, but no trichomoniasis or clue cells. GC testing: Pending.  Will not treat empirically for STI given patient's low suspicion/low risk.  She also denies any abnormal vaginal discharge.  Pelvic exam is reassuring.  On subsequent evaluation, patient is feeling entirely improved with Zofran ODT from a nausea standpoint.  She had some improvement with the Toradol, but still notes that her pain is still there.  Given her reassuring vital signs and laboratory work-up, particularly considering she had a CT imaging just last month,  I have lower suspicion for an acute or emergent process.  While she did have mild lower abdominal region tenderness, this is in the context of her menses which occurred, as expected.  I believe that she would benefit from OB/GYN evaluation on outpatient basis for  ongoing evaluation and management.  I have lower suspicion for diverticulitis despite her LLQ abdominal tenderness given that she has not had any fevers or changes in her bowel habits.  Her laboratory work-up is reassuring.  Do not feel as though imaging or empiric antibiotics is warranted.    Patient agrees with assessment and plan.  She will attempt to follow-up with OB/GYN on outpatient basis.  Strict ED return precautions discussed.  Patient voices understanding.  Will consult with case management so that they may reach out to her to assist with outpatient placement.  Interpreter services were utilized for the entire history, assessment, and plan.  Final Clinical Impression(s) / ED Diagnoses Final diagnoses:  Dysmenorrhea    Rx / DC Orders ED Discharge Orders         Ordered    naproxen (NAPROSYN) 500 MG tablet  2 times daily PRN        07/15/20 1250    ondansetron (ZOFRAN ODT) 4 MG disintegrating tablet  Every 8 hours PRN        07/15/20 1250           Corena Herter, PA-C 07/15/20 1250    Lennice Sites, DO 07/15/20 1536

## 2020-07-15 NOTE — ED Triage Notes (Signed)
C/C lower abdominal pain, reports nausea, onset yesterday. Denies emesis or diarrhea. Started her period yesterday, reports pain with ambulation.

## 2020-07-18 LAB — GC/CHLAMYDIA PROBE AMP (~~LOC~~) NOT AT ARMC
Chlamydia: NEGATIVE
Comment: NEGATIVE
Comment: NORMAL
Neisseria Gonorrhea: NEGATIVE

## 2020-09-23 ENCOUNTER — Encounter (HOSPITAL_COMMUNITY): Payer: Self-pay | Admitting: *Deleted

## 2020-09-23 ENCOUNTER — Emergency Department (HOSPITAL_COMMUNITY)
Admission: EM | Admit: 2020-09-23 | Discharge: 2020-09-23 | Disposition: A | Discharge status: Home / Self Care | Payer: Self-pay | Attending: Emergency Medicine | Admitting: Emergency Medicine

## 2020-09-23 ENCOUNTER — Other Ambulatory Visit: Payer: Self-pay

## 2020-09-23 DIAGNOSIS — R509 Fever, unspecified: Secondary | ICD-10-CM | POA: Insufficient documentation

## 2020-09-23 DIAGNOSIS — Z5321 Procedure and treatment not carried out due to patient leaving prior to being seen by health care provider: Secondary | ICD-10-CM | POA: Insufficient documentation

## 2020-09-23 DIAGNOSIS — R059 Cough, unspecified: Secondary | ICD-10-CM | POA: Insufficient documentation

## 2020-09-23 DIAGNOSIS — R070 Pain in throat: Secondary | ICD-10-CM | POA: Insufficient documentation

## 2020-09-23 DIAGNOSIS — M791 Myalgia, unspecified site: Secondary | ICD-10-CM | POA: Insufficient documentation

## 2020-09-23 NOTE — ED Notes (Signed)
Pt called 3x for room placement. Eloped from waiting area.  

## 2020-09-23 NOTE — ED Notes (Signed)
Patient was called to reassess vital signs but no response. x1 

## 2020-09-23 NOTE — ED Triage Notes (Signed)
Pt complains of fever, body aches, cough x 2 days. No known sick contacts.

## 2020-09-26 ENCOUNTER — Emergency Department (HOSPITAL_COMMUNITY)
Admission: EM | Admit: 2020-09-26 | Discharge: 2020-09-26 | Disposition: A | Discharge status: Home / Self Care | Payer: HRSA Program | Attending: Emergency Medicine | Admitting: Emergency Medicine

## 2020-09-26 DIAGNOSIS — R1110 Vomiting, unspecified: Secondary | ICD-10-CM | POA: Diagnosis not present

## 2020-09-26 DIAGNOSIS — Z20818 Contact with and (suspected) exposure to other bacterial communicable diseases: Secondary | ICD-10-CM

## 2020-09-26 DIAGNOSIS — R509 Fever, unspecified: Secondary | ICD-10-CM

## 2020-09-26 DIAGNOSIS — U071 COVID-19: Secondary | ICD-10-CM | POA: Diagnosis not present

## 2020-09-26 DIAGNOSIS — J45909 Unspecified asthma, uncomplicated: Secondary | ICD-10-CM | POA: Insufficient documentation

## 2020-09-26 DIAGNOSIS — R059 Cough, unspecified: Secondary | ICD-10-CM | POA: Diagnosis present

## 2020-09-26 LAB — SARS CORONAVIRUS 2 (TAT 6-24 HRS): SARS Coronavirus 2: POSITIVE — AB

## 2020-09-26 MED ORDER — AMOXICILLIN 500 MG PO CAPS: 1000.0000 mg | ORAL_CAPSULE | Freq: Every day | ORAL | 0 refills | Status: DC

## 2020-09-26 MED ORDER — ONDANSETRON 4 MG PO TBDP: ORAL_TABLET | ORAL | 0 refills | Status: DC

## 2020-09-26 MED ORDER — ONDANSETRON 4 MG PO TBDP
4.0000 mg | ORAL_TABLET | Freq: Once | ORAL | Status: AC
  Administered 2020-09-26: 4 mg via ORAL
  Filled 2020-09-26: qty 1

## 2020-09-26 NOTE — Discharge Instructions (Signed)
Use Zofran as needed for nausea and vomiting. Use antibiotics as prescribed as you likely have strep pharyngitis with close contact with all your children. Return for new or worsening signs or symptoms.

## 2020-09-26 NOTE — ED Provider Notes (Signed)
[facility] EMERGENCY DEPARTMENT Provider Note   CSN: [id number] Arrival date & time: 09/26/20  0350     History Chief Complaint  Patient presents with  . Emesis  . Fever  . Abdominal Pain    Lorraine [name] is a 34 y.o. female.  Patient presents with cough, fever, body aches, sore throat and upper abdominal discomfort for 2 days.  Patient's children sharing similar symptoms.  Patient's family member had symptoms but unsure if COVID tested.        Past Medical History:  Diagnosis Date  . Angio-edema   . Asthma    Inhaler used 01/02/16  . Diverticulitis   . Nausea & vomiting 04/02/2017  . Pancreatitis   . Pre-diabetes   . Pyelonephritis   . Urticaria     Patient Active Problem List   Diagnosis Date Noted  . Intractable vomiting   . Acute diverticulitis 09/20/2018  . Cervical radiculopathy   . Left arm weakness   . Left-sided weakness 02/19/2018  . Weakness 02/19/2018  . Rash 02/09/2018  . Chest pain 02/08/2018  . Acute pancreatitis 04/02/2017  . Abdominal pain, acute, left upper quadrant 04/02/2017  . Intractable nausea and vomiting 04/02/2017  . Asthma in adult 04/02/2017  . Obesity (BMI 30.0-34.9) 04/02/2017  . Fatty infiltration of liver 04/02/2017  . Gastroesophageal reflux disease without esophagitis   . Pre-diabetes 07/15/2015  . Diarrhea   . Hypokalemia   . Bacterial vaginosis   . Pyelonephritis 07/07/2015  . Abdominal pain, right upper quadrant   . Asthma 10/31/2014  . History of preterm delivery 10/22/2014  . Status post repeat low transverse cesarean section 09/06/2014  . Diverticulitis 09/10/2012    Past Surgical History:  Procedure Laterality Date  . CESAREAN SECTION    . CESAREAN SECTION N/A 2006  . CESAREAN SECTION N/A 09/03/2014   Procedure: CESAREAN SECTION;  Surgeon: Guss Bunde, MD;  Location: [facility];  Service: Obstetrics;  Laterality: N/A;  . CHOLECYSTECTOMY    . ESOPHAGOGASTRODUODENOSCOPY (EGD) WITH  PROPOFOL Left 04/07/2017   Procedure: ESOPHAGOGASTRODUODENOSCOPY (EGD) WITH PROPOFOL;  Surgeon: Ronnette Juniper, MD;  Location: [place];  Service: Gastroenterology;  Laterality: Left;  . TUBAL LIGATION       OB History    Gravida  5   Para  3   Term  1   Preterm  2   AB  2   Living  3     SAB  2   IAB      Ectopic      Multiple  1   Live Births  3           Family History  Problem Relation Age of Onset  . Hyperlipidemia Mother   . Diabetes Father   . Ulcers Father   . Diabetes Maternal Uncle   . Diabetes Paternal Grandmother   . Asthma Daughter   . Allergic rhinitis Neg Hx   . Angioedema Neg Hx   . Eczema Neg Hx   . Immunodeficiency Neg Hx   . Urticaria Neg Hx     Social History   Tobacco Use  . Smoking status: Never Smoker  . Smokeless tobacco: Never Used  Vaping Use  . Vaping Use: Never used  Substance Use Topics  . Alcohol use: No  . Drug use: No    Home Medications Prior to Admission medications   Medication Sig Start Date End Date Taking? Authorizing Provider  amoxicillin (AMOXIL) 500 MG capsule Take 2  capsules (1,000 mg total) by mouth daily. 09/26/20  Yes Elnora Morrison, MD  ondansetron (ZOFRAN ODT) 4 MG disintegrating tablet 4mg  ODT q4 hours prn nausea/vomit 09/26/20  Yes Elnora Morrison, MD  albuterol (VENTOLIN HFA) 108 (90 Base) MCG/ACT inhaler Inhale 1-2 puffs into the lungs every 6 (six) hours as needed for wheezing or shortness of breath.    [provider]  Hyoscyamine Sulfate SL (LEVSIN/SL) 0.125 MG SUBL Place 0.125 mg under the tongue every 8 (eight) hours as needed. 02/02/20   Charlesetta Shanks, MD  naproxen (NAPROSYN) 500 MG tablet Take 1 tablet (500 mg total) by mouth 2 (two) times daily as needed for moderate pain. 07/15/20   Corena Herter, PA-C  omeprazole (PRILOSEC) 20 MG capsule Take 1 capsule (20 mg total) by mouth daily. 02/02/20   Charlesetta Shanks, MD  ondansetron (ZOFRAN ODT) 4 MG disintegrating tablet Take 1 tablet  (4 mg total) by mouth every 8 (eight) hours as needed for nausea or vomiting. 07/15/20   Corena Herter, PA-C  ondansetron (ZOFRAN) 4 MG tablet Take 1 tablet (4 mg total) by mouth every 6 (six) hours. 06/09/20   Antonietta Breach, PA-C  tiZANidine (ZANAFLEX) 4 MG tablet Take 1 tablet (4 mg total) by mouth every 8 (eight) hours as needed for muscle spasms. Patient not taking: Reported on 01/20/2020 12/27/19   Jaynee Eagles, PA-C  dicyclomine (BENTYL) 20 MG tablet Take 1 tablet (20 mg total) by mouth 2 (two) times daily. 11/27/19 12/27/19  Tedd Sias, PA  metoCLOPramide (REGLAN) 10 MG tablet Take 1 tablet (10 mg total) by mouth every 6 (six) hours. 09/28/19 11/25/19  Charlann Lange, PA-C    Allergies    Iodinated diagnostic agents, Lupine bean extract, and Morphine  Review of Systems   Review of Systems  Constitutional: Positive for chills and fever.  HENT: Positive for congestion.   Eyes: Negative for visual disturbance.  Respiratory: Positive for cough. Negative for shortness of breath.   Cardiovascular: Negative for chest pain.  Gastrointestinal: Positive for nausea and vomiting. Negative for abdominal pain.  Genitourinary: Negative for dysuria and flank pain.  Musculoskeletal: Negative for back pain, neck pain and neck stiffness.  Skin: Negative for rash.  Neurological: Negative for light-headedness and headaches.    Physical Exam Updated Vital Signs BP 116/70 (BP Location: Left Arm)   Pulse 88   Temp 98.6 F (37 C) (Oral)   Resp 20   Wt 83.6 kg   LMP 08/25/2020   SpO2 98%   BMI 35.99 kg/m   Physical Exam Vitals and nursing note reviewed.  Constitutional:      Appearance: She is well-developed and well-nourished.  HENT:     Head: Normocephalic and atraumatic.     Mouth/Throat:     Pharynx: No pharyngeal swelling or oropharyngeal exudate.  Eyes:     General:        Right eye: No discharge.        Left eye: No discharge.     Conjunctiva/sclera: Conjunctivae normal.  Neck:      Trachea: No tracheal deviation.  Cardiovascular:     Rate and Rhythm: Normal rate and regular rhythm.  Pulmonary:     Effort: Pulmonary effort is normal.     Breath sounds: Normal breath sounds.  Abdominal:     General: There is no distension.     Palpations: Abdomen is soft.     Tenderness: There is abdominal tenderness in the epigastric area. There is no guarding.  Musculoskeletal:        General: No edema.     Cervical back: Normal range of motion and neck supple.  Skin:    General: Skin is warm.     Findings: No rash.  Neurological:     Mental Status: She is alert and oriented to person, place, and time.  Psychiatric:        Mood and Affect: Mood and affect normal.     ED Results / Procedures / Treatments   Labs (all labs ordered are listed, but only abnormal results are displayed) Labs Reviewed  SARS CORONAVIRUS 2 (TAT 6-24 HRS)    EKG None  Radiology No results found.  Procedures Procedures (including critical care time)  Medications Ordered in ED Medications  ondansetron (ZOFRAN-ODT) disintegrating tablet 4 mg (4 mg Oral Given 09/26/20 0745)    ED Course  I have reviewed the triage vital signs and the nursing notes.  Pertinent labs & imaging results that were available during my care of the patient were reviewed by me and considered in my medical decision making (see chart for details).    MDM Rules/Calculators/A&P                          Patient presents with clinical concern for viral symptoms/COVID for strep throat. Patient's children were assessed during the same visit and had positive strep test.  We will hold strep testing patient. Oral fluid challenge given. Discussed using interpreter supportive care, follow-up of COVID testing and outpatient follow-up. Oral antibiotics for home.  Lorraine [name] was evaluated in Emergency Department on 09/26/2020 for the symptoms described in the history of present illness. She was evaluated in the  context of the global COVID-19 pandemic, which necessitated consideration that the patient might be at risk for infection with the SARS-CoV-2 virus that causes COVID-19. Institutional protocols and algorithms that pertain to the evaluation of patients at risk for COVID-19 are in a state of rapid change based on information released by regulatory bodies including the CDC and federal and state organizations. These policies and algorithms were followed during the patient's care in the ED.   Final Clinical Impression(s) / ED Diagnoses Final diagnoses:  Fever in adult  Vomiting in adult  Strep throat exposure    Rx / DC Orders ED Discharge Orders         Ordered    ondansetron (ZOFRAN ODT) 4 MG disintegrating tablet        09/26/20 0846    amoxicillin (AMOXIL) 500 MG capsule  Daily        09/26/20 0846           Elnora Morrison, MD 09/26/20 [phone]

## 2020-09-26 NOTE — ED Notes (Signed)
Pt coughing , gagging and vomiting

## 2020-09-26 NOTE — ED Triage Notes (Signed)
Vomiting, cough, fever, body aches x2-3 days. Also reports left abdominal/flank pain. Last episode emesis this morning. Children sick with same symptoms.

## 2020-10-03 ENCOUNTER — Encounter (HOSPITAL_COMMUNITY): Payer: Self-pay | Admitting: Emergency Medicine

## 2020-10-03 ENCOUNTER — Emergency Department (HOSPITAL_COMMUNITY)
Admission: EM | Admit: 2020-10-03 | Discharge: 2020-10-03 | Disposition: A | Discharge status: Home / Self Care | Payer: HRSA Program | Attending: Emergency Medicine | Admitting: Emergency Medicine

## 2020-10-03 DIAGNOSIS — U071 COVID-19: Secondary | ICD-10-CM | POA: Diagnosis not present

## 2020-10-03 DIAGNOSIS — Z5321 Procedure and treatment not carried out due to patient leaving prior to being seen by health care provider: Secondary | ICD-10-CM | POA: Diagnosis not present

## 2020-10-03 DIAGNOSIS — R079 Chest pain, unspecified: Secondary | ICD-10-CM | POA: Diagnosis present

## 2020-10-03 NOTE — ED Triage Notes (Signed)
Per pt, states having a headache, cough and sore throat for 3 days-tested positive for covid-was seen at [facility] for similar symptoms

## 2020-10-18 ENCOUNTER — Ambulatory Visit (HOSPITAL_COMMUNITY)
Admission: EM | Admit: 2020-10-18 | Discharge: 2020-10-18 | Disposition: A | Discharge status: Home / Self Care | Payer: Self-pay | Attending: Emergency Medicine | Admitting: Emergency Medicine

## 2020-10-18 ENCOUNTER — Other Ambulatory Visit: Payer: Self-pay

## 2020-10-18 ENCOUNTER — Encounter (HOSPITAL_COMMUNITY): Payer: Self-pay

## 2020-10-18 DIAGNOSIS — J019 Acute sinusitis, unspecified: Secondary | ICD-10-CM

## 2020-10-18 DIAGNOSIS — Z8616 Personal history of COVID-19: Secondary | ICD-10-CM

## 2020-10-18 DIAGNOSIS — J3489 Other specified disorders of nose and nasal sinuses: Secondary | ICD-10-CM

## 2020-10-18 DIAGNOSIS — R059 Cough, unspecified: Secondary | ICD-10-CM

## 2020-10-18 MED ORDER — AMOXICILLIN-POT CLAVULANATE 875-125 MG PO TABS: 1.0000 | ORAL_TABLET | Freq: Two times a day (BID) | ORAL | 0 refills | Status: DC

## 2020-10-18 MED ORDER — CETIRIZINE HCL 10 MG PO TABS: 10.0000 mg | ORAL_TABLET | Freq: Every day | ORAL | 0 refills | Status: DC

## 2020-10-18 MED ORDER — PROMETHAZINE-DM 6.25-15 MG/5ML PO SYRP: 5.0000 mL | ORAL_SOLUTION | Freq: Every evening | ORAL | 0 refills | Status: DC | PRN

## 2020-10-18 MED ORDER — PREDNISONE 20 MG PO TABS: ORAL_TABLET | ORAL | 0 refills | Status: DC

## 2020-10-18 NOTE — ED Triage Notes (Signed)
Pt c/o headache and chest congestion for x 3 days. Pt states the chest congestion causes her to be SOB.

## 2020-10-18 NOTE — ED Provider Notes (Signed)
[place]   MRN: [id number] DOB: 12/25/1985  Subjective:   Lorraine [name] is a 35 y.o. female presenting for 3 day history of worsening sinus congestion, sinus pain, sinus headache. Has also had cough. Symptoms worse at night. Had COVID 19, tested positive 09/26/2020.  Has had some intermittent shortness of breath chest congestion since having had COVID-19.  No current facility-administered medications for this encounter.  Current Outpatient Medications:  .  albuterol (VENTOLIN HFA) 108 (90 Base) MCG/ACT inhaler, Inhale 1-2 puffs into the lungs every 6 (six) hours as needed for wheezing or shortness of breath., Disp: , Rfl:  .  amoxicillin (AMOXIL) 500 MG capsule, Take 2 capsules (1,000 mg total) by mouth daily., Disp: 20 capsule, Rfl: 0 .  Hyoscyamine Sulfate SL (LEVSIN/SL) 0.125 MG SUBL, Place 0.125 mg under the tongue every 8 (eight) hours as needed., Disp: 30 tablet, Rfl: 0 .  naproxen (NAPROSYN) 500 MG tablet, Take 1 tablet (500 mg total) by mouth 2 (two) times daily as needed for moderate pain., Disp: 30 tablet, Rfl: 0 .  omeprazole (PRILOSEC) 20 MG capsule, Take 1 capsule (20 mg total) by mouth daily., Disp: 30 capsule, Rfl: 1 .  ondansetron (ZOFRAN ODT) 4 MG disintegrating tablet, Take 1 tablet (4 mg total) by mouth every 8 (eight) hours as needed for nausea or vomiting., Disp: 20 tablet, Rfl: 0 .  ondansetron (ZOFRAN ODT) 4 MG disintegrating tablet, 4mg  ODT q4 hours prn nausea/vomit, Disp: 6 tablet, Rfl: 0 .  ondansetron (ZOFRAN) 4 MG tablet, Take 1 tablet (4 mg total) by mouth every 6 (six) hours., Disp: 12 tablet, Rfl: 0 .  tiZANidine (ZANAFLEX) 4 MG tablet, Take 1 tablet (4 mg total) by mouth every 8 (eight) hours as needed for muscle spasms. (Patient not taking: Reported on 01/20/2020), Disp: 30 tablet, Rfl: 0   Allergies  Allergen Reactions  . Iodinated Diagnostic Agents Hives and Itching  . Lupine Bean Extract Rash  . Morphine Rash    Past Medical  History:  Diagnosis Date  . Angio-edema   . Asthma    Inhaler used 01/02/16  . Diverticulitis   . Nausea & vomiting 04/02/2017  . Pancreatitis   . Pre-diabetes   . Pyelonephritis   . Urticaria      Past Surgical History:  Procedure Laterality Date  . CESAREAN SECTION    . CESAREAN SECTION N/A 2006  . CESAREAN SECTION N/A 09/03/2014   Procedure: CESAREAN SECTION;  Surgeon: Guss Bunde, MD;  Location: [facility];  Service: Obstetrics;  Laterality: N/A;  . CHOLECYSTECTOMY    . ESOPHAGOGASTRODUODENOSCOPY (EGD) WITH PROPOFOL Left 04/07/2017   Procedure: ESOPHAGOGASTRODUODENOSCOPY (EGD) WITH PROPOFOL;  Surgeon: Ronnette Juniper, MD;  Location: [place];  Service: Gastroenterology;  Laterality: Left;  . TUBAL LIGATION      Family History  Problem Relation Age of Onset  . Hyperlipidemia Mother   . Diabetes Father   . Ulcers Father   . Diabetes Maternal Uncle   . Diabetes Paternal Grandmother   . Asthma Daughter   . Allergic rhinitis Neg Hx   . Angioedema Neg Hx   . Eczema Neg Hx   . Immunodeficiency Neg Hx   . Urticaria Neg Hx     Social History   Tobacco Use  . Smoking status: Never Smoker  . Smokeless tobacco: Never Used  Vaping Use  . Vaping Use: Never used  Substance Use Topics  . Alcohol use: No  . Drug use: No  ROS   Objective:   Vitals: BP 110/75 (BP Location: Right Arm)   Pulse 74   Temp 99 F (37.2 C) (Oral)   Resp 16   LMP 09/12/2020 (Approximate)   SpO2 97%   Physical Exam Constitutional:      General: She is not in acute distress.    Appearance: Normal appearance. She is well-developed. She is not ill-appearing, toxic-appearing or diaphoretic.  HENT:     Head: Normocephalic and atraumatic.     Right Ear: Tympanic membrane and ear canal normal. No drainage or tenderness. No middle ear effusion. Tympanic membrane is not erythematous.     Left Ear: Tympanic membrane and ear canal normal. No drainage or tenderness.  No middle ear effusion.  Tympanic membrane is not erythematous.     Nose: Congestion and rhinorrhea present.     Comments: Bilateral maxillary sinus tenderness, frontal sinus tenderness.    Mouth/Throat:     Mouth: Mucous membranes are moist. No oral lesions.     Pharynx: No pharyngeal swelling, oropharyngeal exudate, posterior oropharyngeal erythema or uvula swelling.     Tonsils: No tonsillar exudate or tonsillar abscesses.     Comments: Thick postnasal drainage overlying pharynx. Eyes:     Extraocular Movements: Extraocular movements intact.     Right eye: Normal extraocular motion.     Left eye: Normal extraocular motion.     Conjunctiva/sclera: Conjunctivae normal.     Pupils: Pupils are equal, round, and reactive to light.  Cardiovascular:     Rate and Rhythm: Normal rate and regular rhythm.     Pulses: Normal pulses.     Heart sounds: Normal heart sounds. No murmur heard. No friction rub. No gallop.   Pulmonary:     Effort: Pulmonary effort is normal. No respiratory distress.     Breath sounds: Normal breath sounds. No stridor. No wheezing, rhonchi or rales.  Musculoskeletal:     Cervical back: Normal range of motion and neck supple.  Lymphadenopathy:     Cervical: No cervical adenopathy.  Skin:    General: Skin is warm and dry.     Findings: No rash.  Neurological:     General: No focal deficit present.     Mental Status: She is alert and oriented to person, place, and time.  Psychiatric:        Mood and Affect: Mood normal.        Behavior: Behavior normal.        Thought Content: Thought content normal.      Assessment and Plan :   PDMP not reviewed this encounter.  1. Acute non-recurrent sinusitis, unspecified location   2. Sinus pain   3. Cough   4. History of COVID-19     Will start empiric treatment for sinusitis with Augmentin, prednisone.  Recommended supportive care otherwise including the use of oral antihistamine, decongestant.  Given normal vital signs, normal  cardiopulmonary exam deferred imaging.  Counseled patient on potential for adverse effects with medications prescribed/recommended today, ER and return-to-clinic precautions discussed, patient verbalized understanding.    Jaynee Eagles, [place] 10/18/20 1948

## 2020-11-11 ENCOUNTER — Emergency Department (HOSPITAL_COMMUNITY)
Admission: EM | Admit: 2020-11-11 | Discharge: 2020-11-12 | Disposition: A | Discharge status: Home / Self Care | Payer: Self-pay | Attending: Emergency Medicine | Admitting: Emergency Medicine

## 2020-11-11 ENCOUNTER — Encounter (HOSPITAL_COMMUNITY): Payer: Self-pay

## 2020-11-11 ENCOUNTER — Other Ambulatory Visit: Payer: Self-pay

## 2020-11-11 DIAGNOSIS — X58XXXA Exposure to other specified factors, initial encounter: Secondary | ICD-10-CM | POA: Insufficient documentation

## 2020-11-11 DIAGNOSIS — R109 Unspecified abdominal pain: Secondary | ICD-10-CM

## 2020-11-11 DIAGNOSIS — R112 Nausea with vomiting, unspecified: Secondary | ICD-10-CM

## 2020-11-11 DIAGNOSIS — T7840XA Allergy, unspecified, initial encounter: Secondary | ICD-10-CM | POA: Insufficient documentation

## 2020-11-11 DIAGNOSIS — K219 Gastro-esophageal reflux disease without esophagitis: Secondary | ICD-10-CM | POA: Insufficient documentation

## 2020-11-11 DIAGNOSIS — N3000 Acute cystitis without hematuria: Secondary | ICD-10-CM | POA: Insufficient documentation

## 2020-11-11 DIAGNOSIS — J45909 Unspecified asthma, uncomplicated: Secondary | ICD-10-CM | POA: Insufficient documentation

## 2020-11-11 LAB — COMPREHENSIVE METABOLIC PANEL
ALT: 15 U/L (ref 0–44)
AST: 19 U/L (ref 15–41)
Albumin: 4.3 g/dL (ref 3.5–5.0)
Alkaline Phosphatase: 87 U/L (ref 38–126)
Anion gap: 11 (ref 5–15)
BUN: 12 mg/dL (ref 6–20)
CO2: 22 mmol/L (ref 22–32)
Calcium: 9.1 mg/dL (ref 8.9–10.3)
Chloride: 106 mmol/L (ref 98–111)
Creatinine, Ser: 0.68 mg/dL (ref 0.44–1.00)
GFR, Estimated: >60 mL/min (ref >60)
Glucose, Bld: 112 mg/dL — ABNORMAL HIGH (ref 70–99)
Potassium: 3 mmol/L — ABNORMAL LOW (ref 3.5–5.1)
Sodium: 139 mmol/L (ref 135–145)
Total Bilirubin: 0.5 mg/dL (ref 0.3–1.2)
Total Protein: 7.5 g/dL (ref 6.5–8.1)

## 2020-11-11 LAB — CBC
HCT: 36 % (ref 36.0–46.0)
Hemoglobin: 12.3 g/dL (ref 12.0–15.0)
MCH: 31 pg (ref 26.0–34.0)
MCHC: 34.2 g/dL (ref 30.0–36.0)
MCV: 90.7 fL (ref 80.0–100.0)
Platelets: 272 10*3/uL (ref 150–400)
RBC: 3.97 MIL/uL (ref 3.87–5.11)
RDW: 12.7 % (ref 11.5–15.5)
WBC: 8.1 10*3/uL (ref 4.0–10.5)
nRBC: 0 % (ref 0.0–0.2)

## 2020-11-11 LAB — I-STAT BETA HCG BLOOD, ED (MC, WL, AP ONLY): I-stat hCG, quantitative: <5 m[IU]/mL (ref <5)

## 2020-11-11 LAB — LIPASE, BLOOD: Lipase: 39 U/L (ref 11–51)

## 2020-11-11 MED ORDER — SODIUM CHLORIDE 0.9 % IV BOLUS
500.0000 mL | Freq: Once | INTRAVENOUS | Status: AC
  Administered 2020-11-11: 500 mL via INTRAVENOUS

## 2020-11-11 MED ORDER — ONDANSETRON HCL 4 MG/2ML IJ SOLN
4.0000 mg | Freq: Once | INTRAMUSCULAR | Status: AC
  Administered 2020-11-11: 4 mg via INTRAVENOUS
  Filled 2020-11-11: qty 2

## 2020-11-11 MED ORDER — FENTANYL CITRATE (PF) 100 MCG/2ML IJ SOLN
50.0000 ug | Freq: Once | INTRAMUSCULAR | Status: AC
  Administered 2020-11-11: 50 ug via INTRAVENOUS
  Filled 2020-11-11: qty 2

## 2020-11-11 NOTE — ED Provider Notes (Addendum)
[facility] DEPT Provider Note   CSN: [id number] Arrival date & time: 11/11/20  2111     History Chief Complaint  Patient presents with  . Emesis  . Flank Pain    Lorraine [name] is a 35 y.o. female presents to the Emergency Department complaining of gradual, persistent, progressively worsening bilateral flank pain with dysuria onset yesterday. Associated symptoms include subjective fevers, chills, nausea and vomiting.  Denies vaginal discharge, vaginal bleeding, hematuria. No aggravating or alleviating factors.  Pt denies headache, neck pain, chest pain, SOB, weakness, dizziness, syncope. Denies hx of nephrolithiasis, but records indicate recurrent pyelonephritis.  Previous microbiology has grown multiple species.    The history is provided by the patient and medical records. The history is limited by a language barrier. A language interpreter was used.       Past Medical History:  Diagnosis Date  . Angio-edema   . Asthma    Inhaler used 01/02/16  . Diverticulitis   . Nausea & vomiting 04/02/2017  . Pancreatitis   . Pre-diabetes   . Pyelonephritis   . Urticaria     Patient Active Problem List   Diagnosis Date Noted  . Intractable vomiting   . Acute diverticulitis 09/20/2018  . Cervical radiculopathy   . Left arm weakness   . Left-sided weakness 02/19/2018  . Weakness 02/19/2018  . Rash 02/09/2018  . Chest pain 02/08/2018  . Acute pancreatitis 04/02/2017  . Abdominal pain, acute, left upper quadrant 04/02/2017  . Intractable nausea and vomiting 04/02/2017  . Asthma in adult 04/02/2017  . Obesity (BMI 30.0-34.9) 04/02/2017  . Fatty infiltration of liver 04/02/2017  . Gastroesophageal reflux disease without esophagitis   . Pre-diabetes 07/15/2015  . Diarrhea   . Hypokalemia   . Bacterial vaginosis   . Pyelonephritis 07/07/2015  . Abdominal pain, right upper quadrant   . Asthma 10/31/2014  . History of preterm delivery 10/22/2014   . Status post repeat low transverse cesarean section 09/06/2014  . Diverticulitis 09/10/2012    Past Surgical History:  Procedure Laterality Date  . CESAREAN SECTION    . CESAREAN SECTION N/A 2006  . CESAREAN SECTION N/A 09/03/2014   Procedure: CESAREAN SECTION;  Surgeon: Guss Bunde, MD;  Location: [facility];  Service: Obstetrics;  Laterality: N/A;  . CHOLECYSTECTOMY    . ESOPHAGOGASTRODUODENOSCOPY (EGD) WITH PROPOFOL Left 04/07/2017   Procedure: ESOPHAGOGASTRODUODENOSCOPY (EGD) WITH PROPOFOL;  Surgeon: Ronnette Juniper, MD;  Location: [facility];  Service: Gastroenterology;  Laterality: Left;  . TUBAL LIGATION       OB History    Gravida  5   Para  3   Term  1   Preterm  2   AB  2   Living  3     SAB  2   IAB      Ectopic      Multiple  1   Live Births  3           Family History  Problem Relation Age of Onset  . Hyperlipidemia Mother   . Diabetes Father   . Ulcers Father   . Diabetes Maternal Uncle   . Diabetes Paternal Grandmother   . Asthma Daughter   . Allergic rhinitis Neg Hx   . Angioedema Neg Hx   . Eczema Neg Hx   . Immunodeficiency Neg Hx   . Urticaria Neg Hx     Social History   Tobacco Use  . Smoking status: Never Smoker  . Smokeless  tobacco: Never Used  Vaping Use  . Vaping Use: Never used  Substance Use Topics  . Alcohol use: No  . Drug use: No    Home Medications Prior to Admission medications   Medication Sig Start Date End Date Taking? Authorizing Provider  cetirizine (ZYRTEC ALLERGY) 10 MG tablet Take 1 tablet (10 mg total) by mouth 2 (two) times daily. 11/12/20  Yes Muthersbaugh, Jarrett Soho, PA-C  famotidine (PEPCID) 20 MG tablet Take 1 tablet (20 mg total) by mouth 2 (two) times daily. 11/12/20  Yes Muthersbaugh, Jarrett Soho, PA-C  ondansetron (ZOFRAN) 4 MG tablet Take 1 tablet (4 mg total) by mouth every 8 (eight) hours as needed for nausea or vomiting. 11/12/20  Yes Muthersbaugh, Jarrett Soho, PA-C  predniSONE (DELTASONE) 20 MG tablet  Take 2 tablets (40 mg total) by mouth daily. 11/12/20  Yes Muthersbaugh, Jarrett Soho, PA-C  sulfamethoxazole-trimethoprim (BACTRIM DS) 800-160 MG tablet Take 1 tablet by mouth 2 (two) times daily for 7 days. 11/12/20 11/19/20 Yes Muthersbaugh, Jarrett Soho, PA-C  albuterol (VENTOLIN HFA) 108 (90 Base) MCG/ACT inhaler Inhale 1-2 puffs into the lungs every 6 (six) hours as needed for wheezing or shortness of breath.    [provider]  Hyoscyamine Sulfate SL (LEVSIN/SL) 0.125 MG SUBL Place 0.125 mg under the tongue every 8 (eight) hours as needed. 02/02/20   Charlesetta Shanks, MD  naproxen (NAPROSYN) 500 MG tablet Take 1 tablet (500 mg total) by mouth 2 (two) times daily as needed for moderate pain. 07/15/20   Corena Herter, PA-C  omeprazole (PRILOSEC) 20 MG capsule Take 1 capsule (20 mg total) by mouth daily. 02/02/20   Charlesetta Shanks, MD  promethazine-dextromethorphan (PROMETHAZINE-DM) 6.25-15 MG/5ML syrup Take 5 mLs by mouth at bedtime as needed for cough. 10/18/20   Jaynee Eagles, PA-C  promethazine-dextromethorphan (PROMETHAZINE-DM) 6.25-15 MG/5ML syrup Take 5 mLs by mouth at bedtime as needed for cough. 10/18/20   Jaynee Eagles, PA-C  tiZANidine (ZANAFLEX) 4 MG tablet Take 1 tablet (4 mg total) by mouth every 8 (eight) hours as needed for muscle spasms. Patient not taking: Reported on 01/20/2020 12/27/19   Jaynee Eagles, PA-C  dicyclomine (BENTYL) 20 MG tablet Take 1 tablet (20 mg total) by mouth 2 (two) times daily. 11/27/19 12/27/19  Tedd Sias, PA  metoCLOPramide (REGLAN) 10 MG tablet Take 1 tablet (10 mg total) by mouth every 6 (six) hours. 09/28/19 11/25/19  Charlann Lange, PA-C    Allergies    Iodinated diagnostic agents, Lupine bean extract, Ceftriaxone, and Morphine  Review of Systems   Review of Systems  Constitutional: Positive for chills and fever ( subjective). Negative for appetite change, diaphoresis, fatigue and unexpected weight change.  HENT: Negative for mouth sores.   Eyes: Negative for  visual disturbance.  Respiratory: Negative for cough, chest tightness, shortness of breath and wheezing.   Cardiovascular: Negative for chest pain.  Gastrointestinal: Positive for abdominal pain, nausea and vomiting. Negative for constipation and diarrhea.  Endocrine: Negative for polydipsia, polyphagia and polyuria.  Genitourinary: Positive for dysuria, flank pain ( bialteral), frequency and urgency. Negative for hematuria.  Musculoskeletal: Negative for back pain and neck stiffness.  Skin: Negative for rash.  Allergic/Immunologic: Negative for immunocompromised state.  Neurological: Negative for syncope, light-headedness and headaches.  Hematological: Does not bruise/bleed easily.  Psychiatric/Behavioral: Negative for sleep disturbance. The patient is not nervous/anxious.     Physical Exam Updated Vital Signs BP (!) 153/115 (BP Location: Right Arm)   Pulse 95   Temp 98.4 F (36.9 C) (Oral)  Resp 18   SpO2 100%   Physical Exam Vitals and nursing note reviewed.  Constitutional:      General: She is not in acute distress.    Appearance: She is well-developed and well-nourished. She is not diaphoretic.  HENT:     Head: Normocephalic and atraumatic.     Mouth/Throat:     Mouth: Oropharynx is clear and moist.     Pharynx: No oropharyngeal exudate.  Cardiovascular:     Rate and Rhythm: Normal rate and regular rhythm.     Pulses: Intact distal pulses.     Heart sounds: Normal heart sounds.  Pulmonary:     Effort: Pulmonary effort is normal. No respiratory distress.     Breath sounds: Normal breath sounds. No wheezing.  Abdominal:     General: Bowel sounds are normal. There is no distension.     Palpations: Abdomen is soft. There is no mass.     Tenderness: There is abdominal tenderness (suprapubic). There is right CVA tenderness and left CVA tenderness. There is no guarding or rebound.  Musculoskeletal:        General: No edema. Normal range of motion.  Skin:    General:  Skin is warm and dry.     Findings: No rash.  Neurological:     Mental Status: She is alert.  Psychiatric:        Mood and Affect: Mood and affect normal.    ED Results / Procedures / Treatments   Labs (all labs ordered are listed, but only abnormal results are displayed) Labs Reviewed  COMPREHENSIVE METABOLIC PANEL - Abnormal; Notable for the following components:      Result Value   Potassium 3.0 (*)    Glucose, Bld 112 (*)    All other components within normal limits  URINALYSIS, ROUTINE W REFLEX MICROSCOPIC - Abnormal; Notable for the following components:   Color, Urine STRAW (*)    All other components within normal limits  URINE CULTURE  LIPASE, BLOOD  CBC  I-STAT BETA HCG BLOOD, ED ([facility], [facility], [facility] ONLY)    Radiology CT Renal Stone Study  Result Date: 11/12/2020 CLINICAL DATA:  Left-sided flank pain EXAM: CT ABDOMEN AND PELVIS WITHOUT CONTRAST TECHNIQUE: Multidetector CT imaging of the abdomen and pelvis was performed following the standard protocol without IV contrast. COMPARISON:  June 08, 2020 FINDINGS: Lower chest: The visualized heart size within normal limits. No pericardial fluid/thickening. No hiatal hernia. The visualized portions of the lungs are clear. Hepatobiliary: Although limited due to the lack of intravenous contrast, normal in appearance without gross focal abnormality. The patient is status post cholecystectomy. No biliary ductal dilation. Pancreas:  Unremarkable.  No surrounding inflammatory changes. Spleen: Normal in size. Although limited due to the lack of intravenous contrast, normal in appearance. Adrenals/Urinary Tract: Both adrenal glands appear normal. Punctate calcifications seen in the upper pole the left kidney. There is question of mild fat stranding changes seen along the superior bladder wall. Stomach/Bowel: The stomach, small bowel, and colon are normal in appearance. No inflammatory changes or obstructive findings. Scattered colonic diverticula  are noted. Appendix is normal. Vascular/Lymphatic: There are no enlarged abdominal or pelvic lymph nodes. No significant gross vascular findings are present given the lack of intravenous contrast. Reproductive: The uterus and adnexa are unremarkable. Clips are seen along the bilateral adnexa. Other: No evidence of abdominal wall mass or hernia. Musculoskeletal: No acute or significant osseous findings. IMPRESSION: Punctate nonobstructing left renal calculus. Findings which may be suggestive of  mild cystitis. Electronically Signed   By: Prudencio Pair M.D.   On: 11/12/2020 02:56    Procedures Procedures   Medications Ordered in ED Medications  fentaNYL (SUBLIMAZE) injection 50 mcg (50 mcg Intravenous Given 11/11/20 2254)  sodium chloride 0.9 % bolus 500 mL (0 mLs Intravenous Stopped 11/12/20 0117)  ondansetron (ZOFRAN) injection 4 mg (4 mg Intravenous Given 11/11/20 2254)  potassium chloride SA (KLOR-CON) CR tablet 40 mEq (40 mEq Oral Given 11/12/20 0346)  cefTRIAXone (ROCEPHIN) 1 g in sodium chloride 0.9 % 100 mL IVPB (0 g Intravenous Stopped 11/12/20 0548)  diphenhydrAMINE (BENADRYL) injection 25 mg (25 mg Intravenous Given 11/12/20 0551)  methylPREDNISolone sodium succinate (SOLU-MEDROL) 125 mg/2 mL injection 125 mg (125 mg Intravenous Given 11/12/20 0552)  famotidine (PEPCID) IVPB 20 mg premix (20 mg Intravenous New Bag/Given 11/12/20 0556)    ED Course  I have reviewed the triage vital signs and the nursing notes.  Pertinent labs & imaging results that were available during my care of the patient were reviewed by me and considered in my medical decision making (see chart for details).    MDM Rules/Calculators/A&P                           Patient presents with bilateral flank pain and urinary symptoms.  Afebrile here in the emergency department.  CVA tenderness.  Concern for pyelonephritis.  No history of nephrolithiasis.  Will obtain labs, UA.  Will give fluids, pain control and nausea  medication. Urine culture sent.  4:47 AM Patient reports she is feeling much better.  Pain well controlled and nausea improved.  She is been able to tolerate p.o. without difficulty.  UA without clear evidence of urinary tract infection however CT scan does show questionable cystitis.  Given patient's symptoms concern for cystitis/pyelonephritis.  Will treat for same.  Rocephin given here in the emergency department.  CT unremarkable for other abdominal/flank etiologies.  Discussed with patient who is comfortable with plan.  Questions answered.  5:25 AM At discharge, patient complaining of feeling "hot" and itching.  Urticarial rash noted.  Patient given IV Benadryl, Solu-Medrol and Pepcid.  Rocephin listed as allergy.  Keflex prescription canceled and changed to Bactrim.  6:31 AM  Patient urticaria has resolved.  She reports she is feeling better.  No difficulty breathing, throat swelling or vomiting.  Will add allergic reaction medications to her discharge.    Final Clinical Impression(s) / ED Diagnoses Final diagnoses:  Acute cystitis without hematuria  Non-intractable vomiting with nausea, unspecified vomiting type  Flank pain  Allergic reaction, initial encounter    Rx / DC Orders ED Discharge Orders         Ordered    cephALEXin (KEFLEX) 500 MG capsule  4 times daily,   Status:  Discontinued        11/12/20 0438    ondansetron (ZOFRAN) 4 MG tablet  Every 8 hours PRN        11/12/20 0438    sulfamethoxazole-trimethoprim (BACTRIM DS) 800-160 MG tablet  2 times daily        11/12/20 0605    cetirizine (ZYRTEC ALLERGY) 10 MG tablet  2 times daily        11/12/20 0627    predniSONE (DELTASONE) 20 MG tablet  Daily        11/12/20 0627    famotidine (PEPCID) 20 MG tablet  2 times daily        11/12/20  9093           Abigail Butts, PA-C 11/12/20 0449    Muthersbaugh, Jarrett Soho, PA-C 11/12/20 1121    Fatima Blank, MD 11/12/20 [phone]

## 2020-11-11 NOTE — ED Notes (Signed)
Provided pt w/labeled specimen cup for urine collection. [employer]

## 2020-11-11 NOTE — ED Triage Notes (Signed)
Pt reports left sided flank pain, chills, pain with urination, and N/V that began yesterday. Pt states she took Tylenol about 3 hours ago.

## 2020-11-12 ENCOUNTER — Emergency Department (HOSPITAL_COMMUNITY): Payer: Self-pay

## 2020-11-12 LAB — URINALYSIS, ROUTINE W REFLEX MICROSCOPIC
Bilirubin Urine: NEGATIVE
Glucose, UA: NEGATIVE mg/dL
Hgb urine dipstick: NEGATIVE
Ketones, ur: NEGATIVE mg/dL
Leukocytes,Ua: NEGATIVE
Nitrite: NEGATIVE
Protein, ur: NEGATIVE mg/dL
Specific Gravity, Urine: 1.009 (ref 1.005–1.030)
pH: 6 (ref 5.0–8.0)

## 2020-11-12 MED ORDER — ONDANSETRON HCL 4 MG PO TABS: 4.0000 mg | ORAL_TABLET | Freq: Three times a day (TID) | ORAL | 0 refills | Status: DC | PRN

## 2020-11-12 MED ORDER — FAMOTIDINE IN NACL 20-0.9 MG/50ML-% IV SOLN
20.0000 mg | Freq: Once | INTRAVENOUS | Status: AC
  Administered 2020-11-12: 20 mg via INTRAVENOUS
  Filled 2020-11-12: qty 50

## 2020-11-12 MED ORDER — PREDNISONE 20 MG PO TABS: 40.0000 mg | ORAL_TABLET | Freq: Every day | ORAL | 0 refills | Status: DC

## 2020-11-12 MED ORDER — FAMOTIDINE 20 MG PO TABS: 20.0000 mg | ORAL_TABLET | Freq: Two times a day (BID) | ORAL | 0 refills | Status: DC

## 2020-11-12 MED ORDER — CEPHALEXIN 500 MG PO CAPS: 500.0000 mg | ORAL_CAPSULE | Freq: Four times a day (QID) | ORAL | 0 refills | Status: DC

## 2020-11-12 MED ORDER — METHYLPREDNISOLONE SODIUM SUCC 125 MG IJ SOLR
125.0000 mg | Freq: Once | INTRAMUSCULAR | Status: AC
  Administered 2020-11-12: 125 mg via INTRAVENOUS
  Filled 2020-11-12: qty 2

## 2020-11-12 MED ORDER — POTASSIUM CHLORIDE CRYS ER 20 MEQ PO TBCR
40.0000 meq | EXTENDED_RELEASE_TABLET | Freq: Once | ORAL | Status: AC
  Administered 2020-11-12: 40 meq via ORAL
  Filled 2020-11-12: qty 2

## 2020-11-12 MED ORDER — DIPHENHYDRAMINE HCL 50 MG/ML IJ SOLN
25.0000 mg | Freq: Once | INTRAMUSCULAR | Status: AC
  Administered 2020-11-12: 25 mg via INTRAVENOUS
  Filled 2020-11-12: qty 1

## 2020-11-12 MED ORDER — CETIRIZINE HCL 10 MG PO TABS: 10.0000 mg | ORAL_TABLET | Freq: Two times a day (BID) | ORAL | 1 refills | Status: DC

## 2020-11-12 MED ORDER — SULFAMETHOXAZOLE-TRIMETHOPRIM 800-160 MG PO TABS: 1.0000 | ORAL_TABLET | Freq: Two times a day (BID) | ORAL | 0 refills | Status: AC

## 2020-11-12 MED ORDER — SODIUM CHLORIDE 0.9 % IV SOLN
1.0000 g | Freq: Once | INTRAVENOUS | Status: AC
  Administered 2020-11-12: 1 g via INTRAVENOUS
  Filled 2020-11-12: qty 10

## 2020-11-12 NOTE — Discharge Instructions (Addendum)
1. Medications: Bactrim (do not fill original keflex prescription) zofran, Zyrtec, prednisone and Pepcid for your allergic reaction, usual home medications 2. Treatment: rest, drink plenty of fluids, take medications as prescribed 3. Follow Up: Please followup with your primary doctor in 3 days for discussion of your diagnoses and further evaluation after today's visit; if you do not have a primary care doctor use the resource guide provided to find one; return to the ER for fevers, persistent vomiting, worsening abdominal pain or other concerning symptoms.

## 2020-11-12 NOTE — ED Notes (Signed)
Patient provided water and crackers.  

## 2020-11-12 NOTE — ED Notes (Addendum)
Nurse walked in to d/c patient, and patient was in obvious distress, itching and clutching her stomach. Nurse asked what was wrong and pt said she believed she was having an allergic reaction to her medication. Nurse called PA in to assess and PA placed orders for medication to combat allergic reaction. New allergy documented in chart.

## 2020-11-13 LAB — URINE CULTURE: Culture: <10000 — AB

## 2020-12-05 ENCOUNTER — Other Ambulatory Visit: Payer: Self-pay

## 2020-12-05 ENCOUNTER — Encounter (HOSPITAL_COMMUNITY): Payer: Self-pay

## 2020-12-05 DIAGNOSIS — R300 Dysuria: Secondary | ICD-10-CM | POA: Insufficient documentation

## 2020-12-05 DIAGNOSIS — R1012 Left upper quadrant pain: Secondary | ICD-10-CM | POA: Insufficient documentation

## 2020-12-05 DIAGNOSIS — J45909 Unspecified asthma, uncomplicated: Secondary | ICD-10-CM | POA: Insufficient documentation

## 2020-12-05 DIAGNOSIS — R112 Nausea with vomiting, unspecified: Secondary | ICD-10-CM | POA: Insufficient documentation

## 2020-12-05 LAB — CBC
HCT: 40.3 % (ref 36.0–46.0)
Hemoglobin: 13.4 g/dL (ref 12.0–15.0)
MCH: 30.4 pg (ref 26.0–34.0)
MCHC: 33.3 g/dL (ref 30.0–36.0)
MCV: 91.4 fL (ref 80.0–100.0)
Platelets: 259 10*3/uL (ref 150–400)
RBC: 4.41 MIL/uL (ref 3.87–5.11)
RDW: 12.4 % (ref 11.5–15.5)
WBC: 9.5 10*3/uL (ref 4.0–10.5)
nRBC: 0 % (ref 0.0–0.2)

## 2020-12-05 LAB — I-STAT BETA HCG BLOOD, ED (MC, WL, AP ONLY): I-stat hCG, quantitative: <5 m[IU]/mL (ref <5)

## 2020-12-05 NOTE — ED Triage Notes (Signed)
Pt sts LUQ abdominal pain since this morning that has been gradually getting worse all day. Nausea and 2 episodes of vomiting.

## 2020-12-06 ENCOUNTER — Emergency Department (HOSPITAL_COMMUNITY)
Admission: EM | Admit: 2020-12-06 | Discharge: 2020-12-06 | Disposition: A | Discharge status: Home / Self Care | Payer: Self-pay | Attending: Emergency Medicine | Admitting: Emergency Medicine

## 2020-12-06 ENCOUNTER — Emergency Department (HOSPITAL_COMMUNITY): Payer: Self-pay

## 2020-12-06 DIAGNOSIS — R1012 Left upper quadrant pain: Secondary | ICD-10-CM

## 2020-12-06 LAB — COMPREHENSIVE METABOLIC PANEL
ALT: 15 U/L (ref 0–44)
AST: 21 U/L (ref 15–41)
Albumin: 4.6 g/dL (ref 3.5–5.0)
Alkaline Phosphatase: 109 U/L (ref 38–126)
Anion gap: 11 (ref 5–15)
BUN: 12 mg/dL (ref 6–20)
CO2: 24 mmol/L (ref 22–32)
Calcium: 9.5 mg/dL (ref 8.9–10.3)
Chloride: 104 mmol/L (ref 98–111)
Creatinine, Ser: 0.64 mg/dL (ref 0.44–1.00)
GFR, Estimated: >60 mL/min (ref >60)
Glucose, Bld: 100 mg/dL — ABNORMAL HIGH (ref 70–99)
Potassium: 3.5 mmol/L (ref 3.5–5.1)
Sodium: 139 mmol/L (ref 135–145)
Total Bilirubin: 0.4 mg/dL (ref 0.3–1.2)
Total Protein: 8.1 g/dL (ref 6.5–8.1)

## 2020-12-06 LAB — URINALYSIS, ROUTINE W REFLEX MICROSCOPIC
Bilirubin Urine: NEGATIVE
Glucose, UA: NEGATIVE mg/dL
Hgb urine dipstick: NEGATIVE
Ketones, ur: 5 mg/dL — AB
Leukocytes,Ua: NEGATIVE
Nitrite: NEGATIVE
Protein, ur: NEGATIVE mg/dL
Specific Gravity, Urine: 1.016 (ref 1.005–1.030)
pH: 6 (ref 5.0–8.0)

## 2020-12-06 LAB — LIPASE, BLOOD: Lipase: 35 U/L (ref 11–51)

## 2020-12-06 MED ORDER — ONDANSETRON HCL 4 MG/2ML IJ SOLN
4.0000 mg | Freq: Once | INTRAMUSCULAR | Status: AC
  Administered 2020-12-06: 4 mg via INTRAVENOUS
  Filled 2020-12-06: qty 2

## 2020-12-06 MED ORDER — KETOROLAC TROMETHAMINE 15 MG/ML IJ SOLN
15.0000 mg | Freq: Once | INTRAMUSCULAR | Status: AC
  Administered 2020-12-06: 15 mg via INTRAVENOUS
  Filled 2020-12-06: qty 1

## 2020-12-06 MED ORDER — SODIUM CHLORIDE 0.9 % IV BOLUS
1000.0000 mL | Freq: Once | INTRAVENOUS | Status: AC
  Administered 2020-12-06: 1000 mL via INTRAVENOUS

## 2020-12-06 MED ORDER — ALUM & MAG HYDROXIDE-SIMETH 200-200-20 MG/5ML PO SUSP
30.0000 mL | Freq: Once | ORAL | Status: AC
  Administered 2020-12-06: 30 mL via ORAL
  Filled 2020-12-06: qty 30

## 2020-12-06 MED ORDER — LIDOCAINE VISCOUS HCL 2 % MT SOLN: 15.0000 mL | OROMUCOSAL | 0 refills | Status: DC | PRN

## 2020-12-06 MED ORDER — LIDOCAINE VISCOUS HCL 2 % MT SOLN
15.0000 mL | Freq: Once | OROMUCOSAL | Status: AC
  Administered 2020-12-06: 15 mL via ORAL

## 2020-12-06 MED ORDER — ALUM & MAG HYDROXIDE-SIMETH 400-400-40 MG/5ML PO SUSP: 10.0000 mL | Freq: Four times a day (QID) | ORAL | 0 refills | Status: DC | PRN

## 2020-12-06 MED ORDER — FENTANYL CITRATE (PF) 100 MCG/2ML IJ SOLN
50.0000 ug | Freq: Once | INTRAMUSCULAR | Status: AC
  Administered 2020-12-06: 50 ug via INTRAVENOUS
  Filled 2020-12-06: qty 2

## 2020-12-06 MED ORDER — ONDANSETRON 4 MG PO TBDP: 4.0000 mg | ORAL_TABLET | Freq: Three times a day (TID) | ORAL | 0 refills | Status: AC | PRN

## 2020-12-06 NOTE — ED Provider Notes (Signed)
[facility] DEPT Provider Note  CSN: [id number] Arrival date & time: 12/05/20 2127  Chief Complaint(s) Abdominal Pain  HPI Lorraine [name] is a 35 y.o. female with a past medical history listed below including diverticulitis who presents to the emergency department with left flank pain that began this morning and gradually got worse throughout the day.  Is associated with nausea and nonbloody nonbilious emesis.  She denies any diarrhea constipation.  She does endorse mild dysuria.  Patient was treated for urinary tract infection last month.  On CT scan she had a small nonobstructing left renal calculi.  She denies any fevers or chills.  No coughing or congestion.  Denies any other physical complaints.  HPI  Past Medical History Past Medical History:  Diagnosis Date  . Angio-edema   . Asthma    Inhaler used 01/02/16  . Diverticulitis   . Nausea & vomiting 04/02/2017  . Pancreatitis   . Pre-diabetes   . Pyelonephritis   . Urticaria    Patient Active Problem List   Diagnosis Date Noted  . Intractable vomiting   . Acute diverticulitis 09/20/2018  . Cervical radiculopathy   . Left arm weakness   . Left-sided weakness 02/19/2018  . Weakness 02/19/2018  . Rash 02/09/2018  . Chest pain 02/08/2018  . Acute pancreatitis 04/02/2017  . Abdominal pain, acute, left upper quadrant 04/02/2017  . Intractable nausea and vomiting 04/02/2017  . Asthma in adult 04/02/2017  . Obesity (BMI 30.0-34.9) 04/02/2017  . Fatty infiltration of liver 04/02/2017  . Gastroesophageal reflux disease without esophagitis   . Pre-diabetes 07/15/2015  . Diarrhea   . Hypokalemia   . Bacterial vaginosis   . Pyelonephritis 07/07/2015  . Abdominal pain, right upper quadrant   . Asthma 10/31/2014  . History of preterm delivery 10/22/2014  . Status post repeat low transverse cesarean section 09/06/2014  . Diverticulitis 09/10/2012   Home Medication(s) Prior to Admission  medications   Medication Sig Start Date End Date Taking? Authorizing Provider  albuterol (VENTOLIN HFA) 108 (90 Base) MCG/ACT inhaler Inhale 1-2 puffs into the lungs every 6 (six) hours as needed for wheezing or shortness of breath.   Yes [provider]  alum & mag hydroxide-simeth (MAALOX MAX) 400-400-40 MG/5ML suspension Take 10 mLs by mouth every 6 (six) hours as needed for indigestion. 12/06/20  Yes Dayelin Balducci, Grayce Sessions, MD  lidocaine (XYLOCAINE) 2 % solution Use as directed 15 mLs in the mouth or throat as needed for mouth pain. 12/06/20  Yes Elven Laboy, Grayce Sessions, MD  ondansetron (ZOFRAN ODT) 4 MG disintegrating tablet Take 1 tablet (4 mg total) by mouth every 8 (eight) hours as needed for up to 3 days for nausea or vomiting. 12/06/20 12/09/20 Yes Dashia Caldeira, Grayce Sessions, MD  cetirizine (ZYRTEC ALLERGY) 10 MG tablet Take 1 tablet (10 mg total) by mouth 2 (two) times daily. Patient not taking: Reported on 12/06/2020 11/12/20   Muthersbaugh, Jarrett Soho, PA-C  dicyclomine (BENTYL) 20 MG tablet Take 1 tablet (20 mg total) by mouth 2 (two) times daily. 11/27/19 12/27/19  Tedd Sias, PA  metoCLOPramide (REGLAN) 10 MG tablet Take 1 tablet (10 mg total) by mouth every 6 (six) hours. 09/28/19 11/25/19  Charlann Lange, PA-C  Past Surgical History Past Surgical History:  Procedure Laterality Date  . CESAREAN SECTION    . CESAREAN SECTION N/A 2006  . CESAREAN SECTION N/A 09/03/2014   Procedure: CESAREAN SECTION;  Surgeon: Guss Bunde, MD;  Location: [facility];  Service: Obstetrics;  Laterality: N/A;  . CHOLECYSTECTOMY    . ESOPHAGOGASTRODUODENOSCOPY (EGD) WITH PROPOFOL Left 04/07/2017   Procedure: ESOPHAGOGASTRODUODENOSCOPY (EGD) WITH PROPOFOL;  Surgeon: Ronnette Juniper, MD;  Location: [facility];  Service: Gastroenterology;  Laterality: Left;  . TUBAL LIGATION      Family History Family History  Problem Relation Age of Onset  . Hyperlipidemia Mother   . Diabetes Father   . Ulcers Father   . Diabetes Maternal Uncle   . Diabetes Paternal Grandmother   . Asthma Daughter   . Allergic rhinitis Neg Hx   . Angioedema Neg Hx   . Eczema Neg Hx   . Immunodeficiency Neg Hx   . Urticaria Neg Hx     Social History Social History   Tobacco Use  . Smoking status: Never Smoker  . Smokeless tobacco: Never Used  Vaping Use  . Vaping Use: Never used  Substance Use Topics  . Alcohol use: No  . Drug use: No   Allergies Iodinated diagnostic agents, Lupine bean extract, Ceftriaxone, and Morphine  Review of Systems Review of Systems All other systems are reviewed and are negative for acute change except as noted in the HPI  Physical Exam Vital Signs  I have reviewed the triage vital signs BP 110/63   Pulse 72   Temp 98.6 F (37 C) (Oral)   Resp 16   Ht 5' (1.524 m)   Wt 79.4 kg   SpO2 99%   BMI 34.18 kg/m   Physical Exam Vitals reviewed.  Constitutional:      General: She is not in acute distress.    Appearance: She is well-developed. She is not diaphoretic.  HENT:     Head: Normocephalic and atraumatic.     Right Ear: External ear normal.     Left Ear: External ear normal.     Nose: Nose normal.  Eyes:     General: No scleral icterus.    Conjunctiva/sclera: Conjunctivae normal.  Neck:     Trachea: Phonation normal.  Cardiovascular:     Rate and Rhythm: Normal rate and regular rhythm.  Pulmonary:     Effort: Pulmonary effort is normal. No respiratory distress.     Breath sounds: No stridor.  Abdominal:     General: There is no distension.     Tenderness: There is abdominal tenderness (left flank) in the left upper quadrant. There is left CVA tenderness and guarding. There is no rebound.  Musculoskeletal:        General: Normal range of motion.     Cervical back: Normal range of motion.  Neurological:     Mental Status:  She is alert and oriented to person, place, and time.  Psychiatric:        Behavior: Behavior normal.     ED Results and Treatments Labs (all labs ordered are listed, but only abnormal results are displayed) Labs Reviewed  COMPREHENSIVE METABOLIC PANEL - Abnormal; Notable for the following components:      Result Value   Glucose, Bld 100 (*)    All other components within normal limits  URINALYSIS, ROUTINE W REFLEX MICROSCOPIC - Abnormal; Notable for the following components:   Ketones, ur 5 (*)    All other components within  normal limits  LIPASE, BLOOD  CBC  I-STAT BETA HCG BLOOD, ED ([facility], [facility], [facility] ONLY)                                                                                                                         EKG  EKG Interpretation  Date/Time:    Ventricular Rate:    PR Interval:    QRS Duration:   QT Interval:    QTC Calculation:   R Axis:     Text Interpretation:        Radiology CT ABDOMEN PELVIS WO CONTRAST  Result Date: 12/06/2020 CLINICAL DATA:  35 year old female with concern for acute diverticulitis. EXAM: CT ABDOMEN AND PELVIS WITHOUT CONTRAST TECHNIQUE: Multidetector CT imaging of the abdomen and pelvis was performed following the standard protocol without IV contrast. COMPARISON:  CT abdomen pelvis dated 11/12/2020. FINDINGS: Evaluation of this exam is limited in the absence of intravenous contrast. Lower chest: Visualized lung bases are clear. No intra-abdominal free air.  Trace free fluid in the pelvis. Hepatobiliary: Fatty liver. No intrahepatic biliary ductal dilatation. Cholecystectomy. No retained calcified stone noted in the central CBD. Pancreas: Unremarkable. No pancreatic ductal dilatation or surrounding inflammatory changes. Spleen: Normal in size without focal abnormality. Adrenals/Urinary Tract: The adrenal glands unremarkable. The kidneys, visualized ureters, and urinary bladder appear unremarkable. Stomach/Bowel: There is sigmoid  diverticulosis without active inflammatory changes. Normal caliber fecalized loops of small bowel may represent increased transit time or small intestinal bacterial overgrowth. There is no bowel obstruction or active inflammation. The appendix is normal. Vascular/Lymphatic: The abdominal aorta and IVC unremarkable. No portal venous gas. There is no adenopathy. Reproductive: The uterus is anteverted and grossly unremarkable. Bilateral ovarian follicles. Tubal ligation clips noted. The right ligation clip is located in the anterior pelvis to the left of the midline and may be displaced. Other: None Musculoskeletal: No acute or significant osseous findings. IMPRESSION: 1. No acute intra-abdominal or pelvic pathology. No hydronephrosis or nephrolithiasis. 2. Sigmoid diverticulosis. No bowel obstruction. Normal appendix. 3. Fatty liver. 4. Right ligation clip is located in the anterior pelvis to the left of the midline and may be displaced. Electronically Signed   By: Anner Crete M.D.   On: 12/06/2020 02:52    Pertinent labs & imaging results that were available during my care of the patient were reviewed by me and considered in my medical decision making (see chart for details).  Medications Ordered in ED Medications  fentaNYL (SUBLIMAZE) injection 50 mcg (50 mcg Intravenous Given 12/06/20 0231)  ondansetron (ZOFRAN) injection 4 mg (4 mg Intravenous Given 12/06/20 0231)  sodium chloride 0.9 % bolus 1,000 mL (0 mLs Intravenous Stopped 12/06/20 0407)  ketorolac (TORADOL) 15 MG/ML injection 15 mg (15 mg Intravenous Given 12/06/20 0406)  alum & mag hydroxide-simeth (MAALOX/MYLANTA) 200-200-20 MG/5ML suspension 30 mL (30 mLs Oral Given 12/06/20 0418)    And  lidocaine (XYLOCAINE) 2 % viscous mouth solution 15 mL (15 mLs Oral Given 12/06/20 0418)  Procedures Procedures  (including  critical care time)  Medical Decision Making / ED Course I have reviewed the nursing notes for this encounter and the patient's prior records (if available in EHR or on provided paperwork).   Lorraine [name] was evaluated in Emergency Department on 12/06/2020 for the symptoms described in the history of present illness. She was evaluated in the context of the global COVID-19 pandemic, which necessitated consideration that the patient might be at risk for infection with the SARS-CoV-2 virus that causes COVID-19. Institutional protocols and algorithms that pertain to the evaluation of patients at risk for COVID-19 are in a state of rapid change based on information released by regulatory bodies including the CDC and federal and state organizations. These policies and algorithms were followed during the patient's care in the ED.  Left-sided abdominal pain with tenderness to palpation. Gastritis versus diverticulitis versus pyelonephritis versus renal colic.  Labs grossly reassuring without leukocytosis or anemia.  No significant electrolyte derangement or renal sufficiency.  No evidence of bili obstruction or pancreatitis. UA without evidence of infection. CT scan without evidence of obstruction or diverticulitis.  Treated with GI cocktail which did provide relief.       Final Clinical Impression(s) / ED Diagnoses Final diagnoses:  Left upper quadrant abdominal pain    The patient appears reasonably screened and/or stabilized for discharge and I doubt any other medical condition or other [facility] requiring further screening, evaluation, or treatment in the ED at this time prior to discharge. Safe for discharge with strict return precautions.  Disposition: Discharge  Condition: Good  I have discussed the results, Dx and Tx plan with the patient/family who expressed understanding and agree(s) with the plan. Discharge instructions discussed at length. The patient/family was given strict return  precautions who verbalized understanding of the instructions. No further questions at time of discharge.    ED Discharge Orders         Ordered    alum & mag hydroxide-simeth (MAALOX MAX) 400-400-40 MG/5ML suspension  Every 6 hours PRN        12/06/20 0538    lidocaine (XYLOCAINE) 2 % solution  As needed        12/06/20 0538    ondansetron (ZOFRAN ODT) 4 MG disintegrating tablet  Every 8 hours PRN        12/06/20 0538           Follow Up: Primary care provider  Schedule an appointment as soon as possible for a visit  if you do not have a primary care physician, contact [facility] at [phone] for referral     This chart was dictated using voice recognition software.  Despite best efforts to proofread,  errors can occur which can change the documentation meaning.   Fatima Blank, MD 12/06/20 [phone]

## 2020-12-06 NOTE — ED Notes (Signed)
Pt aware of need for urine sample.  

## 2020-12-06 NOTE — ED Notes (Signed)
Pt to ct via stretcher

## 2021-01-09 ENCOUNTER — Encounter (HOSPITAL_COMMUNITY): Payer: Self-pay | Admitting: *Deleted

## 2021-01-09 ENCOUNTER — Emergency Department (HOSPITAL_COMMUNITY)
Admission: EM | Admit: 2021-01-09 | Discharge: 2021-01-10 | Disposition: A | Discharge status: Home / Self Care | Payer: Self-pay | Attending: Emergency Medicine | Admitting: Emergency Medicine

## 2021-01-09 ENCOUNTER — Other Ambulatory Visit: Payer: Self-pay

## 2021-01-09 ENCOUNTER — Emergency Department (HOSPITAL_COMMUNITY): Payer: Self-pay

## 2021-01-09 DIAGNOSIS — R0602 Shortness of breath: Secondary | ICD-10-CM | POA: Insufficient documentation

## 2021-01-09 DIAGNOSIS — J45909 Unspecified asthma, uncomplicated: Secondary | ICD-10-CM | POA: Insufficient documentation

## 2021-01-09 DIAGNOSIS — K219 Gastro-esophageal reflux disease without esophagitis: Secondary | ICD-10-CM | POA: Insufficient documentation

## 2021-01-09 DIAGNOSIS — R112 Nausea with vomiting, unspecified: Secondary | ICD-10-CM | POA: Insufficient documentation

## 2021-01-09 DIAGNOSIS — R6883 Chills (without fever): Secondary | ICD-10-CM | POA: Insufficient documentation

## 2021-01-09 DIAGNOSIS — R1012 Left upper quadrant pain: Secondary | ICD-10-CM | POA: Insufficient documentation

## 2021-01-09 LAB — COMPREHENSIVE METABOLIC PANEL
ALT: 20 U/L (ref 0–44)
AST: 24 U/L (ref 15–41)
Albumin: 4.4 g/dL (ref 3.5–5.0)
Alkaline Phosphatase: 114 U/L (ref 38–126)
Anion gap: 10 (ref 5–15)
BUN: 16 mg/dL (ref 6–20)
CO2: 21 mmol/L — ABNORMAL LOW (ref 22–32)
Calcium: 9.3 mg/dL (ref 8.9–10.3)
Chloride: 106 mmol/L (ref 98–111)
Creatinine, Ser: 0.78 mg/dL (ref 0.44–1.00)
GFR, Estimated: >60 mL/min (ref >60)
Glucose, Bld: 115 mg/dL — ABNORMAL HIGH (ref 70–99)
Potassium: 4 mmol/L (ref 3.5–5.1)
Sodium: 137 mmol/L (ref 135–145)
Total Bilirubin: 0.3 mg/dL (ref 0.3–1.2)
Total Protein: 8 g/dL (ref 6.5–8.1)

## 2021-01-09 LAB — LIPASE, BLOOD: Lipase: 38 U/L (ref 11–51)

## 2021-01-09 LAB — CBC
HCT: 42.5 % (ref 36.0–46.0)
Hemoglobin: 13.8 g/dL (ref 12.0–15.0)
MCH: 31 pg (ref 26.0–34.0)
MCHC: 32.5 g/dL (ref 30.0–36.0)
MCV: 95.5 fL (ref 80.0–100.0)
Platelets: 243 10*3/uL (ref 150–400)
RBC: 4.45 MIL/uL (ref 3.87–5.11)
RDW: 12.4 % (ref 11.5–15.5)
WBC: 12.2 10*3/uL — ABNORMAL HIGH (ref 4.0–10.5)
nRBC: 0 % (ref 0.0–0.2)

## 2021-01-09 LAB — URINALYSIS, ROUTINE W REFLEX MICROSCOPIC
Bilirubin Urine: NEGATIVE
Glucose, UA: NEGATIVE mg/dL
Hgb urine dipstick: NEGATIVE
Ketones, ur: NEGATIVE mg/dL
Leukocytes,Ua: NEGATIVE
Nitrite: NEGATIVE
Protein, ur: NEGATIVE mg/dL
Specific Gravity, Urine: 1.021 (ref 1.005–1.030)
pH: 6 (ref 5.0–8.0)

## 2021-01-09 LAB — I-STAT BETA HCG BLOOD, ED (MC, WL, AP ONLY): I-stat hCG, quantitative: <5 m[IU]/mL (ref <5)

## 2021-01-09 MED ORDER — KETOROLAC TROMETHAMINE 15 MG/ML IJ SOLN
15.0000 mg | Freq: Once | INTRAMUSCULAR | Status: AC
  Administered 2021-01-09: 15 mg via INTRAVENOUS
  Filled 2021-01-09: qty 1

## 2021-01-09 MED ORDER — FENTANYL CITRATE (PF) 100 MCG/2ML IJ SOLN
50.0000 ug | Freq: Once | INTRAMUSCULAR | Status: AC
  Administered 2021-01-09: 50 ug via INTRAVENOUS
  Filled 2021-01-09: qty 2

## 2021-01-09 MED ORDER — ALUM & MAG HYDROXIDE-SIMETH 200-200-20 MG/5ML PO SUSP
30.0000 mL | Freq: Once | ORAL | Status: AC
  Administered 2021-01-09: 30 mL via ORAL
  Filled 2021-01-09: qty 30

## 2021-01-09 MED ORDER — LIDOCAINE VISCOUS HCL 2 % MT SOLN
15.0000 mL | Freq: Once | OROMUCOSAL | Status: AC
  Administered 2021-01-09: 15 mL via ORAL
  Filled 2021-01-09: qty 15

## 2021-01-09 MED ORDER — SODIUM CHLORIDE 0.9 % IV BOLUS
1000.0000 mL | Freq: Once | INTRAVENOUS | Status: AC
  Administered 2021-01-09: 1000 mL via INTRAVENOUS

## 2021-01-09 MED ORDER — ONDANSETRON HCL 4 MG/2ML IJ SOLN
4.0000 mg | Freq: Once | INTRAMUSCULAR | Status: AC
  Administered 2021-01-09: 4 mg via INTRAVENOUS
  Filled 2021-01-09: qty 2

## 2021-01-09 NOTE — ED Notes (Signed)
Unable to collect due to stop in blood flow.

## 2021-01-09 NOTE — ED Provider Notes (Signed)
[facility] DEPT Provider Note   CSN: [id number] Arrival date & time: 01/09/21  1938     History Chief Complaint  Patient presents with  . Abdominal Pain    Lorraine [name] is a 35 y.o. female.  35 y.o female with a PMH of Pancreatitis presents to the ED with a chief complaint of abdominal pain x yesterday. She describes the pain as constant cramping without any radiation. She has taken tylenol to help with symptoms without improvement.  The pain is exacerbated postprandial, does report mild improvement without any food intake.  She has a prior history of pancreatitis, she denies any alcohol abuse, no prior history of diabetes.  She does endorse about 5 episodes of nonbilious, nonbloody emesis. She reports similar symptoms with her last episode of pancreatitis.  Also reports some shortness of breath, this occurs whenever she is getting ready to have another episode of emesis.  She does have prior surgical history of cholecystectomy, C-section.  Endorses some chills.  She denies any fever, chest pain, urinary symptoms.  No vaginal bleeding, last menstrual period was last month in the month of March.  No sick exposures.  The history is provided by the patient and medical records.       Past Medical History:  Diagnosis Date  . Angio-edema   . Asthma    Inhaler used 01/02/16  . Diverticulitis   . Nausea & vomiting 04/02/2017  . Pancreatitis   . Pre-diabetes   . Pyelonephritis   . Urticaria     Patient Active Problem List   Diagnosis Date Noted  . Intractable vomiting   . Acute diverticulitis 09/20/2018  . Cervical radiculopathy   . Left arm weakness   . Left-sided weakness 02/19/2018  . Weakness 02/19/2018  . Rash 02/09/2018  . Chest pain 02/08/2018  . Acute pancreatitis 04/02/2017  . Abdominal pain, acute, left upper quadrant 04/02/2017  . Intractable nausea and vomiting 04/02/2017  . Asthma in adult 04/02/2017  . Obesity (BMI 30.0-34.9)  04/02/2017  . Fatty infiltration of liver 04/02/2017  . Gastroesophageal reflux disease without esophagitis   . Pre-diabetes 07/15/2015  . Diarrhea   . Hypokalemia   . Bacterial vaginosis   . Pyelonephritis 07/07/2015  . Abdominal pain, right upper quadrant   . Asthma 10/31/2014  . History of preterm delivery 10/22/2014  . Status post repeat low transverse cesarean section 09/06/2014  . Diverticulitis 09/10/2012    Past Surgical History:  Procedure Laterality Date  . CESAREAN SECTION    . CESAREAN SECTION N/A 2006  . CESAREAN SECTION N/A 09/03/2014   Procedure: CESAREAN SECTION;  Surgeon: Guss Bunde, MD;  Location: [facility];  Service: Obstetrics;  Laterality: N/A;  . CHOLECYSTECTOMY    . ESOPHAGOGASTRODUODENOSCOPY (EGD) WITH PROPOFOL Left 04/07/2017   Procedure: ESOPHAGOGASTRODUODENOSCOPY (EGD) WITH PROPOFOL;  Surgeon: Ronnette Juniper, MD;  Location: [facility];  Service: Gastroenterology;  Laterality: Left;  . TUBAL LIGATION       OB History    Gravida  5   Para  3   Term  1   Preterm  2   AB  2   Living  3     SAB  2   IAB      Ectopic      Multiple  1   Live Births  3           Family History  Problem Relation Age of Onset  . Hyperlipidemia Mother   . Diabetes Father   .  Ulcers Father   . Diabetes Maternal Uncle   . Diabetes Paternal Grandmother   . Asthma Daughter   . Allergic rhinitis Neg Hx   . Angioedema Neg Hx   . Eczema Neg Hx   . Immunodeficiency Neg Hx   . Urticaria Neg Hx     Social History   Tobacco Use  . Smoking status: Never Smoker  . Smokeless tobacco: Never Used  Vaping Use  . Vaping Use: Never used  Substance Use Topics  . Alcohol use: No  . Drug use: No    Home Medications Prior to Admission medications   Medication Sig Start Date End Date Taking? Authorizing Provider  ondansetron (ZOFRAN) 4 MG tablet Take 1 tablet (4 mg total) by mouth every 6 (six) hours for 5 days. 01/10/21 01/15/21 Yes Alicea Wente, Beverley Fiedler, PA-C   albuterol (VENTOLIN HFA) 108 (90 Base) MCG/ACT inhaler Inhale 1-2 puffs into the lungs every 6 (six) hours as needed for wheezing or shortness of breath.    [provider]  alum & mag hydroxide-simeth (MAALOX MAX) 400-400-40 MG/5ML suspension Take 10 mLs by mouth every 6 (six) hours as needed for up to 5 days for indigestion. 01/10/21 01/15/21  Janeece Fitting, PA-C  cetirizine (ZYRTEC ALLERGY) 10 MG tablet Take 1 tablet (10 mg total) by mouth 2 (two) times daily. Patient not taking: Reported on 12/06/2020 11/12/20   Muthersbaugh, Jarrett Soho, PA-C  lidocaine (XYLOCAINE) 2 % solution Use as directed 15 mLs in the mouth or throat as needed for mouth pain. 12/06/20   Fatima Blank, MD  dicyclomine (BENTYL) 20 MG tablet Take 1 tablet (20 mg total) by mouth 2 (two) times daily. 11/27/19 12/27/19  Tedd Sias, PA  metoCLOPramide (REGLAN) 10 MG tablet Take 1 tablet (10 mg total) by mouth every 6 (six) hours. 09/28/19 11/25/19  Charlann Lange, PA-C    Allergies    Iodinated diagnostic agents, Lupine bean extract, Ceftriaxone, and Morphine  Review of Systems   Review of Systems  Constitutional: Positive for chills. Negative for fever.  HENT: Negative for sneezing and sore throat.   Respiratory: Negative for shortness of breath.   Cardiovascular: Negative for chest pain.  Gastrointestinal: Positive for abdominal pain, nausea and vomiting.  Genitourinary: Negative for flank pain.  Musculoskeletal: Negative for back pain.  Skin: Negative for pallor and wound.  Neurological: Negative for light-headedness and headaches.  All other systems reviewed and are negative.   Physical Exam Updated Vital Signs BP (!) 145/102   Pulse 74   Temp 98.7 F (37.1 C) (Oral)   Resp (!) 25   Wt 79.4 kg   SpO2 100%   BMI 34.18 kg/m   Physical Exam Vitals and nursing note reviewed.  Constitutional:      Appearance: She is obese. She is ill-appearing.  HENT:     Head: Normocephalic and atraumatic.   Eyes:     Extraocular Movements: Extraocular movements intact.  Cardiovascular:     Rate and Rhythm: Tachycardia present.     Heart sounds: No murmur heard.   Pulmonary:     Effort: Pulmonary effort is normal.     Breath sounds: No wheezing or rales.  Abdominal:     General: Abdomen is flat. A surgical scar is present. Bowel sounds are decreased. There is distension.     Palpations: Abdomen is soft.     Tenderness: There is abdominal tenderness in the epigastric area and left upper quadrant. There is left CVA tenderness and rebound. There  is no right CVA tenderness or guarding. Negative signs include McBurney's sign.     Comments: Abdomen is soft, tender to palpation worsen with palpation. There is rebound present.   Skin:    General: Skin is warm and dry.  Neurological:     Mental Status: She is alert.     ED Results / Procedures / Treatments   Labs (all labs ordered are listed, but only abnormal results are displayed) Labs Reviewed  COMPREHENSIVE METABOLIC PANEL - Abnormal; Notable for the following components:      Result Value   CO2 21 (*)    Glucose, Bld 115 (*)    All other components within normal limits  CBC - Abnormal; Notable for the following components:   WBC 12.2 (*)    All other components within normal limits  LIPASE, BLOOD  URINALYSIS, ROUTINE W REFLEX MICROSCOPIC  I-STAT BETA HCG BLOOD, ED ([facility], [facility], [facility] ONLY)    EKG EKG Interpretation  Date/Time:  Monday January 09 2021 19:58:10 EDT Ventricular Rate:  81 PR Interval:  145 QRS Duration: 107 QT Interval:  379 QTC Calculation: 440 R Axis:   56 Text Interpretation: Sinus rhythm Low voltage, precordial leads 12 Lead; Mason-Likar No significant change since last tracing Confirmed by Dorie Rank [id number]) on 01/09/2021 9:48:58 PM   Radiology CT ABDOMEN PELVIS WO CONTRAST  Result Date: 01/09/2021 CLINICAL DATA:  35 year old female with left upper quadrant abdominal pain. EXAM: CT ABDOMEN AND PELVIS WITHOUT  CONTRAST TECHNIQUE: Multidetector CT imaging of the abdomen and pelvis was performed following the standard protocol without IV contrast. COMPARISON:  CT abdomen pelvis dated 12/06/2020. FINDINGS: Evaluation of this exam is limited in the absence of intravenous contrast. Lower chest: The visualized lung bases are clear. No intra-abdominal free air or free fluid. Hepatobiliary: The liver is unremarkable. No intrahepatic biliary dilatation. Cholecystectomy. No retained calcified stone noted in the central CBD. Pancreas: Unremarkable. No pancreatic ductal dilatation or surrounding inflammatory changes. Spleen: Normal in size without focal abnormality. Adrenals/Urinary Tract: The adrenal glands are unremarkable. The kidneys, visualized ureters, and urinary bladder appear unremarkable. Stomach/Bowel: There is sigmoid diverticulosis without active inflammatory changes. There is no bowel obstruction or active inflammation. The appendix is normal. Vascular/Lymphatic: The abdominal aorta and IVC unremarkable. No portal venous gas. There is no adenopathy. Reproductive: The uterus is anteverted. Probable 2 cm right ovarian dominant follicle or corpus luteum. The left ovary is unremarkable. Two tubal ligation clips noted to the left of the uterus. The right tubal ligation clip may have been displaced. Other: None Musculoskeletal: No acute or significant osseous findings. IMPRESSION: 1. No acute intra-abdominal or pelvic pathology. 2. Sigmoid diverticulosis. No bowel obstruction. Normal appendix. Electronically Signed   By: Anner Crete M.D.   On: 01/09/2021 22:22    Procedures Procedures   Medications Ordered in ED Medications  fentaNYL (SUBLIMAZE) injection 50 mcg (50 mcg Intravenous Given 01/09/21 2137)  sodium chloride 0.9 % bolus 1,000 mL (1,000 mLs Intravenous Bolus from Bag 01/09/21 2201)  ondansetron (ZOFRAN) injection 4 mg (4 mg Intravenous Given 01/09/21 2159)  ketorolac (TORADOL) 15 MG/ML injection 15 mg  (15 mg Intravenous Given 01/09/21 2325)  alum & mag hydroxide-simeth (MAALOX/MYLANTA) 200-200-20 MG/5ML suspension 30 mL (30 mLs Oral Given 01/09/21 2328)    And  lidocaine (XYLOCAINE) 2 % viscous mouth solution 15 mL (15 mLs Oral Given 01/09/21 2328)    ED Course  I have reviewed the triage vital signs and the nursing notes.  Pertinent labs &  imaging results that were available during my care of the patient were reviewed by me and considered in my medical decision making (see chart for details).  Clinical Course as of 01/10/21 0007  Mon Jan 09, 2021  2228 Lipase: 38 [JS]  2228 WBC(!): 12.2 [JS]    Clinical Course User Index [JS] Janeece Fitting, PA-C   MDM Rules/Calculators/A&P     Patient with a PMH of pancreatitis presents to the ED with a chief complaint of left upper quadrant pain which began yesterday.  Patient reports the pain has been sharp in nature, feeling like muscle spasms of the left upper quadrant, similar symptoms were experienced when patient had pancreatitis.  She denies any history of alcohol abuse, does not have any prior history of diabetes although her records indicate that she is prediabetic.  She has tried over-the-counter medication without much improvement in her symptoms.  Has had multiple episodes nonbilious, nonbloody emesis.  Denies any fever but does endorse some chills.  During primary evaluation heart rate is slightly elevated, blood pressure slightly elevated.  She appears very uncomfortable, lungs are clear to auscultation, there is some tenderness along the left CVA, denies any urinary symptoms or prior history of kidney stones.  Abdomen is soft, tender to palpation on the left upper quadrant, there is guarding and rebound present on my exam.  Bilateral calves without any pitting edema or calf tenderness.  The rest of her exam is benign moves all upper and lower extremities.  Differential diagnoses included but not limited to pancreatitis versus nephrolithiasis  versus enteritis.  Patient does have a history of allergy to CT contrast, given pain medication, fluid resuscitation along with symptomatic treatment at this time pending CT imaging.  Interpretation of her labs showed a CMP without any electrolyte derangement.  Creatinine levels within normal limits.  LFTs are unremarkable, has a prior history of cholecystectomy.  Denies any alcohol abuse.  Her lipase level on today's visit is within normal limits.  CBC with a slight leukocytosis of 12.2, globin is within normal limits.  UA without any signs of infection.  Beta hCG is negative.  CT Abdomen and Pelvis: 1. No acute intra-abdominal or pelvic pathology.  2. Sigmoid diverticulosis. No bowel obstruction. Normal appendix.     Patient has received medication for pain control, GI cocktail, Zofran for nausea.  12:07 AM patient was reevaluated by me, reports symptoms have improved significantly after nausea medication along with Toradol.  We discussed diet changes at home, she will also need to follow-up from GI as this has been recurring according to her chart review.  Patient is agreeable with plan and treatment, return precautions discussed at length.  Patient stable for discharge.  Portions of this note were generated with [employer]. Dictation errors may occur despite best attempts at proofreading.  Final Clinical Impression(s) / ED Diagnoses Final diagnoses:  Left upper quadrant abdominal pain    Rx / DC Orders ED Discharge Orders         Ordered    ondansetron (ZOFRAN) 4 MG tablet  Every 6 hours        01/10/21 0001    alum & mag hydroxide-simeth (MAALOX MAX) 287-867-67 MG/5ML suspension  Every 6 hours PRN        01/10/21 0001           Tanaiya, [place], PA-C 01/10/21 0007    Lacretia Leigh, MD 01/10/21 1932

## 2021-01-09 NOTE — ED Provider Notes (Incomplete)
[facility] DEPT Provider Note   CSN: [id number] Arrival date & time: 01/09/21  1938     History Chief Complaint  Patient presents with  . Abdominal Pain    Lorraine [name] is a 35 y.o. female.  35 y.o female with a PMH of Pancreatitis presents to the ED with a chief complaint of abdominal pain x yesterday. She describes the pain as constant cramping without any radiation. She has taken tylenol to help with symptoms without improvement.  The pain is exacerbated postprandial, does report mild improvement without any food intake.  She has a prior history of pancreatitis, she denies any alcohol abuse, no prior history of diabetes.  She does endorse about 5 episodes of nonbilious, nonbloody emesis. She reports similar symptoms with her last episode of pancreatitis.  Also reports some shortness of breath, this occurs whenever she is getting ready to have another episode of emesis.  She does have prior surgical history of cholecystectomy, C-section.  Endorses some chills.  She denies any fever, chest pain, urinary symptoms.  No vaginal bleeding, last menstrual period was last month in the month of March.  No sick exposures.  The history is provided by the patient and medical records.       Past Medical History:  Diagnosis Date  . Angio-edema   . Asthma    Inhaler used 01/02/16  . Diverticulitis   . Nausea & vomiting 04/02/2017  . Pancreatitis   . Pre-diabetes   . Pyelonephritis   . Urticaria     Patient Active Problem List   Diagnosis Date Noted  . Intractable vomiting   . Acute diverticulitis 09/20/2018  . Cervical radiculopathy   . Left arm weakness   . Left-sided weakness 02/19/2018  . Weakness 02/19/2018  . Rash 02/09/2018  . Chest pain 02/08/2018  . Acute pancreatitis 04/02/2017  . Abdominal pain, acute, left upper quadrant 04/02/2017  . Intractable nausea and vomiting 04/02/2017  . Asthma in adult 04/02/2017  . Obesity (BMI 30.0-34.9)  04/02/2017  . Fatty infiltration of liver 04/02/2017  . Gastroesophageal reflux disease without esophagitis   . Pre-diabetes 07/15/2015  . Diarrhea   . Hypokalemia   . Bacterial vaginosis   . Pyelonephritis 07/07/2015  . Abdominal pain, right upper quadrant   . Asthma 10/31/2014  . History of preterm delivery 10/22/2014  . Status post repeat low transverse cesarean section 09/06/2014  . Diverticulitis 09/10/2012    Past Surgical History:  Procedure Laterality Date  . CESAREAN SECTION    . CESAREAN SECTION N/A 2006  . CESAREAN SECTION N/A 09/03/2014   Procedure: CESAREAN SECTION;  Surgeon: Guss Bunde, MD;  Location: [facility];  Service: Obstetrics;  Laterality: N/A;  . CHOLECYSTECTOMY    . ESOPHAGOGASTRODUODENOSCOPY (EGD) WITH PROPOFOL Left 04/07/2017   Procedure: ESOPHAGOGASTRODUODENOSCOPY (EGD) WITH PROPOFOL;  Surgeon: Ronnette Juniper, MD;  Location: [facility];  Service: Gastroenterology;  Laterality: Left;  . TUBAL LIGATION       OB History    Gravida  5   Para  3   Term  1   Preterm  2   AB  2   Living  3     SAB  2   IAB      Ectopic      Multiple  1   Live Births  3           Family History  Problem Relation Age of Onset  . Hyperlipidemia Mother   . Diabetes Father   .  Ulcers Father   . Diabetes Maternal Uncle   . Diabetes Paternal Grandmother   . Asthma Daughter   . Allergic rhinitis Neg Hx   . Angioedema Neg Hx   . Eczema Neg Hx   . Immunodeficiency Neg Hx   . Urticaria Neg Hx     Social History   Tobacco Use  . Smoking status: Never Smoker  . Smokeless tobacco: Never Used  Vaping Use  . Vaping Use: Never used  Substance Use Topics  . Alcohol use: No  . Drug use: No    Home Medications Prior to Admission medications   Medication Sig Start Date End Date Taking? Authorizing Provider  ondansetron (ZOFRAN) 4 MG tablet Take 1 tablet (4 mg total) by mouth every 6 (six) hours for 5 days. 01/10/21 01/15/21 Yes Novia Lansberry, Beverley Fiedler, PA-C   albuterol (VENTOLIN HFA) 108 (90 Base) MCG/ACT inhaler Inhale 1-2 puffs into the lungs every 6 (six) hours as needed for wheezing or shortness of breath.    [provider]  alum & mag hydroxide-simeth (MAALOX MAX) 400-400-40 MG/5ML suspension Take 10 mLs by mouth every 6 (six) hours as needed for up to 5 days for indigestion. 01/10/21 01/15/21  Janeece Fitting, PA-C  cetirizine (ZYRTEC ALLERGY) 10 MG tablet Take 1 tablet (10 mg total) by mouth 2 (two) times daily. Patient not taking: Reported on 12/06/2020 11/12/20   Muthersbaugh, Jarrett Soho, PA-C  lidocaine (XYLOCAINE) 2 % solution Use as directed 15 mLs in the mouth or throat as needed for mouth pain. 12/06/20   Fatima Blank, MD  dicyclomine (BENTYL) 20 MG tablet Take 1 tablet (20 mg total) by mouth 2 (two) times daily. 11/27/19 12/27/19  Tedd Sias, PA  metoCLOPramide (REGLAN) 10 MG tablet Take 1 tablet (10 mg total) by mouth every 6 (six) hours. 09/28/19 11/25/19  Charlann Lange, PA-C    Allergies    Iodinated diagnostic agents, Lupine bean extract, Ceftriaxone, and Morphine  Review of Systems   Review of Systems  Constitutional: Positive for chills. Negative for fever.  HENT: Negative for sneezing and sore throat.   Respiratory: Negative for shortness of breath.   Cardiovascular: Negative for chest pain.  Gastrointestinal: Positive for abdominal pain, nausea and vomiting.  Genitourinary: Negative for flank pain.  Musculoskeletal: Negative for back pain.  Skin: Negative for pallor and wound.  Neurological: Negative for light-headedness and headaches.  All other systems reviewed and are negative.   Physical Exam Updated Vital Signs BP (!) 145/102   Pulse 74   Temp 98.7 F (37.1 C) (Oral)   Resp (!) 25   Wt 79.4 kg   SpO2 100%   BMI 34.18 kg/m   Physical Exam Vitals and nursing note reviewed.  Constitutional:      Appearance: She is obese. She is ill-appearing.  HENT:     Head: Normocephalic and atraumatic.   Eyes:     Extraocular Movements: Extraocular movements intact.  Cardiovascular:     Rate and Rhythm: Tachycardia present.     Heart sounds: No murmur heard.   Pulmonary:     Effort: Pulmonary effort is normal.     Breath sounds: No wheezing or rales.  Abdominal:     General: Abdomen is flat. A surgical scar is present. Bowel sounds are decreased. There is distension.     Palpations: Abdomen is soft.     Tenderness: There is abdominal tenderness in the epigastric area and left upper quadrant. There is left CVA tenderness and rebound. There  is no right CVA tenderness or guarding. Negative signs include McBurney's sign.     Comments: Abdomen is soft, tender to palpation worsen with palpation. There is rebound present.   Skin:    General: Skin is warm and dry.  Neurological:     Mental Status: She is alert.     ED Results / Procedures / Treatments   Labs (all labs ordered are listed, but only abnormal results are displayed) Labs Reviewed  COMPREHENSIVE METABOLIC PANEL - Abnormal; Notable for the following components:      Result Value   CO2 21 (*)    Glucose, Bld 115 (*)    All other components within normal limits  CBC - Abnormal; Notable for the following components:   WBC 12.2 (*)    All other components within normal limits  LIPASE, BLOOD  URINALYSIS, ROUTINE W REFLEX MICROSCOPIC  I-STAT BETA HCG BLOOD, ED ([facility], [facility], [facility] ONLY)    EKG EKG Interpretation  Date/Time:  Monday January 09 2021 19:58:10 EDT Ventricular Rate:  81 PR Interval:  145 QRS Duration: 107 QT Interval:  379 QTC Calculation: 440 R Axis:   56 Text Interpretation: Sinus rhythm Low voltage, precordial leads 12 Lead; Mason-Likar No significant change since last tracing Confirmed by Dorie Rank [phone]) on 01/09/2021 9:48:58 PM   Radiology CT ABDOMEN PELVIS WO CONTRAST  Result Date: 01/09/2021 CLINICAL DATA:  35 year old female with left upper quadrant abdominal pain. EXAM: CT ABDOMEN AND PELVIS WITHOUT  CONTRAST TECHNIQUE: Multidetector CT imaging of the abdomen and pelvis was performed following the standard protocol without IV contrast. COMPARISON:  CT abdomen pelvis dated 12/06/2020. FINDINGS: Evaluation of this exam is limited in the absence of intravenous contrast. Lower chest: The visualized lung bases are clear. No intra-abdominal free air or free fluid. Hepatobiliary: The liver is unremarkable. No intrahepatic biliary dilatation. Cholecystectomy. No retained calcified stone noted in the central CBD. Pancreas: Unremarkable. No pancreatic ductal dilatation or surrounding inflammatory changes. Spleen: Normal in size without focal abnormality. Adrenals/Urinary Tract: The adrenal glands are unremarkable. The kidneys, visualized ureters, and urinary bladder appear unremarkable. Stomach/Bowel: There is sigmoid diverticulosis without active inflammatory changes. There is no bowel obstruction or active inflammation. The appendix is normal. Vascular/Lymphatic: The abdominal aorta and IVC unremarkable. No portal venous gas. There is no adenopathy. Reproductive: The uterus is anteverted. Probable 2 cm right ovarian dominant follicle or corpus luteum. The left ovary is unremarkable. Two tubal ligation clips noted to the left of the uterus. The right tubal ligation clip may have been displaced. Other: None Musculoskeletal: No acute or significant osseous findings. IMPRESSION: 1. No acute intra-abdominal or pelvic pathology. 2. Sigmoid diverticulosis. No bowel obstruction. Normal appendix. Electronically Signed   By: Anner Crete M.D.   On: 01/09/2021 22:22    Procedures Procedures {Remember to document critical care time when appropriate:1}  Medications Ordered in ED Medications  fentaNYL (SUBLIMAZE) injection 50 mcg (50 mcg Intravenous Given 01/09/21 2137)  sodium chloride 0.9 % bolus 1,000 mL (1,000 mLs Intravenous Bolus from Bag 01/09/21 2201)  ondansetron (ZOFRAN) injection 4 mg (4 mg Intravenous Given  01/09/21 2159)  ketorolac (TORADOL) 15 MG/ML injection 15 mg (15 mg Intravenous Given 01/09/21 2325)  alum & mag hydroxide-simeth (MAALOX/MYLANTA) 200-200-20 MG/5ML suspension 30 mL (30 mLs Oral Given 01/09/21 2328)    And  lidocaine (XYLOCAINE) 2 % viscous mouth solution 15 mL (15 mLs Oral Given 01/09/21 2328)    ED Course  I have reviewed the triage vital signs and  the nursing notes.  Pertinent labs & imaging results that were available during my care of the patient were reviewed by me and considered in my medical decision making (see chart for details).  Clinical Course as of 01/10/21 0002  Mon Jan 09, 2021  2228 Lipase: 38 [JS]  2228 WBC(!): 12.2 [JS]    Clinical Course User Index [JS] Janeece Fitting, PA-C   MDM Rules/Calculators/A&P     Patient with a PMH of pancreatitis presents to the ED with a chief complaint of left upper quadrant pain which began yesterday.  Patient reports the pain has been sharp in nature, feeling like muscle spasms of the left upper quadrant, similar symptoms were experienced when patient had pancreatitis.  She denies any history of alcohol abuse, does not have any prior history of diabetes although her records indicate that she is prediabetic.  She has tried over-the-counter medication without much improvement in her symptoms.  Has had multiple episodes nonbilious, nonbloody emesis.  Denies any fever but does endorse some chills.  During primary evaluation heart rate is slightly elevated, blood pressure slightly elevated.  She appears very uncomfortable, lungs are clear to auscultation, there is some tenderness along the left CVA, denies any urinary symptoms or prior history of kidney stones.  Abdomen is soft, tender to palpation on the left upper quadrant, there is guarding and rebound present on my exam.  Bilateral calves without any pitting edema or calf tenderness.  The rest of her exam is benign moves all upper and lower extremities.  Differential diagnoses  included but not limited to pancreatitis versus nephrolithiasis versus enteritis.  Patient does have a history of allergy to CT contrast, given pain medication, fluid resuscitation along with symptomatic treatment at this time pending CT imaging.  Interpretation of her labs showed a CMP without any electrolyte derangement.  Creatinine levels within normal limits.  LFTs are unremarkable, has a prior history of cholecystectomy.  Denies any alcohol abuse.  Her lipase level on today's visit is within normal limits.  CBC with a slight leukocytosis of 12.2, globin is within normal limits.  UA without any signs of infection.  Beta hCG is negative.  CT Abdomen and Pelvis: 1. No acute intra-abdominal or pelvic pathology.  2. Sigmoid diverticulosis. No bowel obstruction. Normal appendix.     Patient has received medication for pain control, GI cocktail, Zofran for nausea.    Portions of this note were generated with [employer]. Dictation errors may occur despite best attempts at proofreading.  Final Clinical Impression(s) / ED Diagnoses Final diagnoses:  Left upper quadrant abdominal pain    Rx / DC Orders ED Discharge Orders         Ordered    ondansetron (ZOFRAN) 4 MG tablet  Every 6 hours        01/10/21 0001    alum & mag hydroxide-simeth (MAALOX MAX) 400-400-40 MG/5ML suspension  Every 6 hours PRN        01/10/21 0001

## 2021-01-09 NOTE — Discharge Instructions (Addendum)
[employer] de sus laboratorios fueron normales durante su visita de hoy.   Le he recetado medicina para la nausea. Tenga en cuenta su dieta en casa, evitando las frituras y [place].   Por favor haga una cita con el gastroenterologo para obtener mejor informacion con sus dolores de [place]. Marland Kitchenm

## 2021-01-09 NOTE — ED Triage Notes (Signed)
Emergency Medicine Provider Triage Evaluation Note  Lorraine [name] , a 35 y.o. female  was evaluated in triage.  Pt complains of left upper abdominal pain.  She states that last night she started having a mild pain in the region that is progressively worsened over the past 24 hours.  Her current pain is now 10/10 and she is tearful in triage.  She reports 3 episodes of emesis throughout the day today.  Nonbilious/nonbloody.  No diarrhea or constipation.  She states she had a normal bowel movement this afternoon.  She reports a surgical history to her abdomen including C-section as well as cholecystectomy.  She denies any other complaints at this time.  Physical Exam  BP (!) 167/119 (BP Location: Left Arm)   Pulse 91   Temp 98.5 F (36.9 C) (Oral)   Resp 16   Wt 79.4 kg   SpO2 100%   BMI 34.18 kg/m  Gen:   Awake, no distress   HEENT:  Atraumatic  Resp:  Normal effort  Cardiac:  Normal rate  Abd:   Nondistended, moderate tenderness in the left upper quadrant MSK:   Moves extremities without difficulty  Neuro:  Speech clear   Medical Decision Making  Medically screening exam initiated at 8:09 PM.  Appropriate orders placed.  Tene Perez-Matiano was informed that the remainder of the evaluation will be completed by another provider, this initial triage assessment does not replace that evaluation, and the importance of remaining in the ED until their evaluation is complete.   Rayna Sexton, PA-C 01/09/21 2011

## 2021-01-09 NOTE — ED Notes (Signed)
Pt returned to room  

## 2021-01-09 NOTE — ED Triage Notes (Addendum)
Pt is here for abdominal pain with nausea and vomiting, this began very mildly yesterday and has been increasing.  Pt reports that she vomited x3 today and has pain with urination. Denies any change in vaginal discharge.  LBM was today and was wnl

## 2021-01-09 NOTE — ED Notes (Signed)
Patient transported to CT 

## 2021-01-10 MED ORDER — ALUM & MAG HYDROXIDE-SIMETH 400-400-40 MG/5ML PO SUSP: 10.0000 mL | Freq: Four times a day (QID) | ORAL | 0 refills | Status: AC | PRN

## 2021-01-10 MED ORDER — ONDANSETRON HCL 4 MG PO TABS: 4.0000 mg | ORAL_TABLET | Freq: Four times a day (QID) | ORAL | 0 refills | Status: AC

## 2021-01-10 NOTE — ED Notes (Signed)
Pt tolerated PO fluids and crackers well.

## 2021-01-10 NOTE — ED Notes (Signed)
Pt verbalized understanding of d/c, medication, and follow up care. Ambulatory with steady gait. (interpreter used)

## 2021-01-23 ENCOUNTER — Emergency Department (HOSPITAL_COMMUNITY)
Admission: EM | Admit: 2021-01-23 | Discharge: 2021-01-23 | Disposition: A | Discharge status: Home / Self Care | Payer: Self-pay | Attending: Emergency Medicine | Admitting: Emergency Medicine

## 2021-01-23 ENCOUNTER — Other Ambulatory Visit: Payer: Self-pay

## 2021-01-23 ENCOUNTER — Encounter (HOSPITAL_COMMUNITY): Payer: Self-pay

## 2021-01-23 DIAGNOSIS — K5900 Constipation, unspecified: Secondary | ICD-10-CM | POA: Insufficient documentation

## 2021-01-23 DIAGNOSIS — Z5321 Procedure and treatment not carried out due to patient leaving prior to being seen by health care provider: Secondary | ICD-10-CM | POA: Insufficient documentation

## 2021-01-23 DIAGNOSIS — R109 Unspecified abdominal pain: Secondary | ICD-10-CM | POA: Insufficient documentation

## 2021-01-23 DIAGNOSIS — R6883 Chills (without fever): Secondary | ICD-10-CM | POA: Insufficient documentation

## 2021-01-23 DIAGNOSIS — R112 Nausea with vomiting, unspecified: Secondary | ICD-10-CM | POA: Insufficient documentation

## 2021-01-23 NOTE — ED Triage Notes (Signed)
Left sided abdominal pain with nausea, vomiting and constipation x 2 days.

## 2021-01-23 NOTE — ED Notes (Signed)
Pt called for room x1 with no response

## 2021-01-23 NOTE — ED Provider Notes (Cosign Needed)
Emergency Medicine Provider Triage Evaluation Note  Vaughn Frieze , a 35 y.o. female  was evaluated in triage.  Pt complains of left-sided abdominal pain with complaint of constipation, nausea, vomiting, chills. LMP 3 weeks ago..  Review of Systems  Positive: Abdominal pain, constipation, vomiting, chills Negative: Changes in bladder habits, possibility of pregnancy  Physical Exam  There were no vitals taken for this visit. Gen:   Awake, no distress, appears uncomfortable    Resp:  Normal effort  MSK:   Moves extremities without difficulty  Other:    Medical Decision Making  Medically screening exam initiated at 9:27 PM.  Appropriate orders placed.  Estelene Perez-Matiano was informed that the remainder of the evaluation will be completed by another provider, this initial triage assessment does not replace that evaluation, and the importance of remaining in the ED until their evaluation is complete.     Tacy Learn, PA-C 01/23/21 2128

## 2021-01-23 NOTE — ED Notes (Signed)
Pt called for room x3 with no response, pt not visualized in lobby.

## 2021-01-24 ENCOUNTER — Other Ambulatory Visit: Payer: Self-pay

## 2021-01-24 ENCOUNTER — Encounter (HOSPITAL_COMMUNITY): Payer: Self-pay | Admitting: Emergency Medicine

## 2021-01-24 ENCOUNTER — Emergency Department (HOSPITAL_COMMUNITY)
Admission: EM | Admit: 2021-01-24 | Discharge: 2021-01-25 | Disposition: A | Discharge status: Home / Self Care | Payer: Self-pay | Attending: Emergency Medicine | Admitting: Emergency Medicine

## 2021-01-24 ENCOUNTER — Emergency Department (HOSPITAL_COMMUNITY)
Admission: EM | Admit: 2021-01-24 | Discharge: 2021-01-24 | Disposition: A | Discharge status: Home / Self Care | Payer: Self-pay | Attending: Emergency Medicine | Admitting: Emergency Medicine

## 2021-01-24 ENCOUNTER — Encounter (HOSPITAL_COMMUNITY): Payer: Self-pay

## 2021-01-24 DIAGNOSIS — R1032 Left lower quadrant pain: Secondary | ICD-10-CM | POA: Insufficient documentation

## 2021-01-24 DIAGNOSIS — R109 Unspecified abdominal pain: Secondary | ICD-10-CM

## 2021-01-24 DIAGNOSIS — J45909 Unspecified asthma, uncomplicated: Secondary | ICD-10-CM | POA: Insufficient documentation

## 2021-01-24 DIAGNOSIS — R6883 Chills (without fever): Secondary | ICD-10-CM | POA: Insufficient documentation

## 2021-01-24 DIAGNOSIS — R112 Nausea with vomiting, unspecified: Secondary | ICD-10-CM | POA: Insufficient documentation

## 2021-01-24 DIAGNOSIS — D72829 Elevated white blood cell count, unspecified: Secondary | ICD-10-CM | POA: Insufficient documentation

## 2021-01-24 LAB — URINALYSIS, ROUTINE W REFLEX MICROSCOPIC
Bilirubin Urine: NEGATIVE
Glucose, UA: NEGATIVE mg/dL
Hgb urine dipstick: NEGATIVE
Ketones, ur: 5 mg/dL — AB
Leukocytes,Ua: NEGATIVE
Nitrite: NEGATIVE
Protein, ur: NEGATIVE mg/dL
Specific Gravity, Urine: 1.018 (ref 1.005–1.030)
pH: 6 (ref 5.0–8.0)

## 2021-01-24 LAB — CBC WITH DIFFERENTIAL/PLATELET
Abs Immature Granulocytes: 0.05 10*3/uL (ref 0.00–0.07)
Abs Immature Granulocytes: 0.04 10*3/uL (ref 0.00–0.07)
Basophils Absolute: 0 10*3/uL (ref 0.0–0.1)
Basophils Absolute: 0.1 10*3/uL (ref 0.0–0.1)
Basophils Relative: 0 %
Basophils Relative: 1 %
Eosinophils Absolute: 0 10*3/uL (ref 0.0–0.5)
Eosinophils Absolute: 0.1 10*3/uL (ref 0.0–0.5)
Eosinophils Relative: 0 %
Eosinophils Relative: 1 %
HCT: 39.3 % (ref 36.0–46.0)
HCT: 38.6 % (ref 36.0–46.0)
Hemoglobin: 12.9 g/dL (ref 12.0–15.0)
Hemoglobin: 12.7 g/dL (ref 12.0–15.0)
Immature Granulocytes: 0 %
Immature Granulocytes: 0 %
Lymphocytes Relative: 22 %
Lymphocytes Relative: 25 %
Lymphs Abs: 2.5 10*3/uL (ref 0.7–4.0)
Lymphs Abs: 2.7 10*3/uL (ref 0.7–4.0)
MCH: 29.9 pg (ref 26.0–34.0)
MCH: 30.5 pg (ref 26.0–34.0)
MCHC: 32.8 g/dL (ref 30.0–36.0)
MCHC: 32.9 g/dL (ref 30.0–36.0)
MCV: 91 fL (ref 80.0–100.0)
MCV: 92.6 fL (ref 80.0–100.0)
Monocytes Absolute: 0.6 10*3/uL (ref 0.1–1.0)
Monocytes Absolute: 0.6 10*3/uL (ref 0.1–1.0)
Monocytes Relative: 5 %
Monocytes Relative: 5 %
Neutro Abs: 8.3 10*3/uL — ABNORMAL HIGH (ref 1.7–7.7)
Neutro Abs: 7.2 10*3/uL (ref 1.7–7.7)
Neutrophils Relative %: 73 %
Neutrophils Relative %: 68 %
Platelets: 338 10*3/uL (ref 150–400)
Platelets: 313 10*3/uL (ref 150–400)
RBC: 4.32 MIL/uL (ref 3.87–5.11)
RBC: 4.17 MIL/uL (ref 3.87–5.11)
RDW: 12.2 % (ref 11.5–15.5)
RDW: 12.2 % (ref 11.5–15.5)
WBC: 11.5 10*3/uL — ABNORMAL HIGH (ref 4.0–10.5)
WBC: 10.7 10*3/uL — ABNORMAL HIGH (ref 4.0–10.5)
nRBC: 0 % (ref 0.0–0.2)
nRBC: 0 % (ref 0.0–0.2)

## 2021-01-24 LAB — LIPASE, BLOOD
Lipase: 33 U/L (ref 11–51)
Lipase: 33 U/L (ref 11–51)

## 2021-01-24 LAB — COMPREHENSIVE METABOLIC PANEL
ALT: 37 U/L (ref 0–44)
ALT: 35 U/L (ref 0–44)
AST: 25 U/L (ref 15–41)
AST: 26 U/L (ref 15–41)
Albumin: 4.2 g/dL (ref 3.5–5.0)
Albumin: 4 g/dL (ref 3.5–5.0)
Alkaline Phosphatase: 97 U/L (ref 38–126)
Alkaline Phosphatase: 92 U/L (ref 38–126)
Anion gap: 7 (ref 5–15)
Anion gap: 6 (ref 5–15)
BUN: 10 mg/dL (ref 6–20)
BUN: 17 mg/dL (ref 6–20)
CO2: 24 mmol/L (ref 22–32)
CO2: 25 mmol/L (ref 22–32)
Calcium: 9.3 mg/dL (ref 8.9–10.3)
Calcium: 8.9 mg/dL (ref 8.9–10.3)
Chloride: 105 mmol/L (ref 98–111)
Chloride: 105 mmol/L (ref 98–111)
Creatinine, Ser: 0.72 mg/dL (ref 0.44–1.00)
Creatinine, Ser: 0.91 mg/dL (ref 0.44–1.00)
GFR, Estimated: >60 mL/min (ref >60)
GFR, Estimated: >60 mL/min (ref >60)
Glucose, Bld: 110 mg/dL — ABNORMAL HIGH (ref 70–99)
Glucose, Bld: 115 mg/dL — ABNORMAL HIGH (ref 70–99)
Potassium: 3.4 mmol/L — ABNORMAL LOW (ref 3.5–5.1)
Potassium: 3.7 mmol/L (ref 3.5–5.1)
Sodium: 136 mmol/L (ref 135–145)
Sodium: 136 mmol/L (ref 135–145)
Total Bilirubin: 0.4 mg/dL (ref 0.3–1.2)
Total Bilirubin: 0.3 mg/dL (ref 0.3–1.2)
Total Protein: 7.9 g/dL (ref 6.5–8.1)
Total Protein: 7.5 g/dL (ref 6.5–8.1)

## 2021-01-24 LAB — I-STAT BETA HCG BLOOD, ED (MC, WL, AP ONLY): I-stat hCG, quantitative: <5 m[IU]/mL (ref <5)

## 2021-01-24 MED ORDER — SODIUM CHLORIDE 0.9 % IV BOLUS
1000.0000 mL | Freq: Once | INTRAVENOUS | Status: AC
  Administered 2021-01-24: 1000 mL via INTRAVENOUS

## 2021-01-24 MED ORDER — DICYCLOMINE HCL 20 MG PO TABS: 20.0000 mg | ORAL_TABLET | Freq: Two times a day (BID) | ORAL | 0 refills | Status: DC

## 2021-01-24 MED ORDER — METOCLOPRAMIDE HCL 10 MG PO TABS: 10.0000 mg | ORAL_TABLET | Freq: Four times a day (QID) | ORAL | 0 refills | Status: DC

## 2021-01-24 MED ORDER — HYDROMORPHONE HCL 1 MG/ML IJ SOLN
0.5000 mg | Freq: Once | INTRAMUSCULAR | Status: AC
  Administered 2021-01-24: 0.5 mg via INTRAVENOUS
  Filled 2021-01-24: qty 1

## 2021-01-24 MED ORDER — ONDANSETRON 4 MG PO TBDP
4.0000 mg | ORAL_TABLET | Freq: Once | ORAL | Status: AC
  Administered 2021-01-24: 4 mg via ORAL
  Filled 2021-01-24: qty 1

## 2021-01-24 MED ORDER — KETOROLAC TROMETHAMINE 30 MG/ML IJ SOLN
30.0000 mg | Freq: Once | INTRAMUSCULAR | Status: AC
  Administered 2021-01-24: 30 mg via INTRAVENOUS
  Filled 2021-01-24: qty 1

## 2021-01-24 MED ORDER — ONDANSETRON HCL 4 MG/2ML IJ SOLN
4.0000 mg | Freq: Once | INTRAMUSCULAR | Status: AC
  Administered 2021-01-24: 4 mg via INTRAVENOUS
  Filled 2021-01-24: qty 2

## 2021-01-24 MED ORDER — DIPHENHYDRAMINE HCL 50 MG/ML IJ SOLN
12.5000 mg | Freq: Once | INTRAMUSCULAR | Status: AC
  Administered 2021-01-24: 12.5 mg via INTRAVENOUS
  Filled 2021-01-24: qty 1

## 2021-01-24 MED ORDER — METOCLOPRAMIDE HCL 5 MG/ML IJ SOLN
10.0000 mg | Freq: Once | INTRAMUSCULAR | Status: AC
  Administered 2021-01-24: 10 mg via INTRAVENOUS
  Filled 2021-01-24: qty 2

## 2021-01-24 MED ORDER — DICYCLOMINE HCL 10 MG PO CAPS
10.0000 mg | ORAL_CAPSULE | Freq: Once | ORAL | Status: AC
  Administered 2021-01-24: 10 mg via ORAL
  Filled 2021-01-24: qty 1

## 2021-01-24 NOTE — ED Provider Notes (Signed)
Emergency Medicine Provider Triage Evaluation Note  Lorraine [name] , a 35 y.o. female  was evaluated in triage.  Pt complains of recurrent nausea vomiting, left lower abdominal pain.  Was evaluated early this morning for abdominal pain, improved after interventions.   Review of Systems  Positive: abd pain, N/V Negative: fever  Physical Exam  BP (!) 163/102 (BP Location: Left Arm)   Pulse 83   Temp 98.2 F (36.8 C) (Oral)   Resp 18   Ht 5\' 2"  (1.575 m)   Wt 79.4 kg   SpO2 100%   BMI 32.02 kg/m  Gen:   Awake Resp:  Normal effort  MSK:   Moves extremities without difficulty  Other:  Minimal TTP left abdomen, no guarding or rebound  Medical Decision Making  Medically screening exam initiated at 10:36 PM.  Appropriate orders placed.  Mahayla Perez-Matiano was informed that the remainder of the evaluation will be completed by another provider, this initial triage assessment does not replace that evaluation, and the importance of remaining in the ED until their evaluation is complete.  Repeat blood work ordered, zofran   Rukiya Hodgkins, [place] N, PA-C 01/24/21 2244    Valarie Merino, MD 01/25/21 [phone]

## 2021-01-24 NOTE — ED Triage Notes (Addendum)
Family member translated per pt request. Pt reports LLQ abdominal pain x 3 days. Admits to nausea, 4 episodes of vomiting and constipation . Labs obtained at [place] seen for same.

## 2021-01-24 NOTE — ED Provider Notes (Signed)
[facility] DEPT Provider Note   CSN: [id number] Arrival date & time: 01/24/21  0003     History Chief Complaint  Patient presents with  . Abdominal Pain    Lorraine [name] is a 35 y.o. female with a history of pancreatitis, pyelonephritis, diverticulitis, angioedema, asthma, and uterine fibroids who presents the emergency department with a chief complaint of abdominal pain.  The patient endorses waxing and waning cramping left sided upper and lower abdominal pain for the last 3 days.  She reports associated nausea and 4 episodes of nonbloody, nonbilious vomiting.  Her last bowel movement was yesterday, but she has been feeling more constipated.  She has been passing flatus.  She reports associated chills.  No fever, diarrhea, dysuria, hematuria, vaginal bleeding or discharge, back pain, flank pain, cough, chest pain, or shortness of breath.  No treatment prior to arrival.  The history is provided by the patient and medical records. A language interpreter was used ([place]).       Past Medical History:  Diagnosis Date  . Angio-edema   . Asthma    Inhaler used 01/02/16  . Diverticulitis   . Nausea & vomiting 04/02/2017  . Pancreatitis   . Pre-diabetes   . Pyelonephritis   . Urticaria     Patient Active Problem List   Diagnosis Date Noted  . Intractable vomiting   . Acute diverticulitis 09/20/2018  . Cervical radiculopathy   . Left arm weakness   . Left-sided weakness 02/19/2018  . Weakness 02/19/2018  . Rash 02/09/2018  . Chest pain 02/08/2018  . Acute pancreatitis 04/02/2017  . Abdominal pain, acute, left upper quadrant 04/02/2017  . Intractable nausea and vomiting 04/02/2017  . Asthma in adult 04/02/2017  . Obesity (BMI 30.0-34.9) 04/02/2017  . Fatty infiltration of liver 04/02/2017  . Gastroesophageal reflux disease without esophagitis   . Pre-diabetes 07/15/2015  . Diarrhea   . Hypokalemia   . Bacterial vaginosis   .  Pyelonephritis 07/07/2015  . Abdominal pain, right upper quadrant   . Asthma 10/31/2014  . History of preterm delivery 10/22/2014  . Status post repeat low transverse cesarean section 09/06/2014  . Diverticulitis 09/10/2012    Past Surgical History:  Procedure Laterality Date  . CESAREAN SECTION    . CESAREAN SECTION N/A 2006  . CESAREAN SECTION N/A 09/03/2014   Procedure: CESAREAN SECTION;  Surgeon: Guss Bunde, MD;  Location: [facility];  Service: Obstetrics;  Laterality: N/A;  . CHOLECYSTECTOMY    . ESOPHAGOGASTRODUODENOSCOPY (EGD) WITH PROPOFOL Left 04/07/2017   Procedure: ESOPHAGOGASTRODUODENOSCOPY (EGD) WITH PROPOFOL;  Surgeon: Ronnette Juniper, MD;  Location: [facility];  Service: Gastroenterology;  Laterality: Left;  . TUBAL LIGATION       OB History    Gravida  5   Para  3   Term  1   Preterm  2   AB  2   Living  3     SAB  2   IAB      Ectopic      Multiple  1   Live Births  3           Family History  Problem Relation Age of Onset  . Hyperlipidemia Mother   . Diabetes Father   . Ulcers Father   . Diabetes Maternal Uncle   . Diabetes Paternal Grandmother   . Asthma Daughter   . Allergic rhinitis Neg Hx   . Angioedema Neg Hx   . Eczema Neg Hx   .  Immunodeficiency Neg Hx   . Urticaria Neg Hx     Social History   Tobacco Use  . Smoking status: Never Smoker  . Smokeless tobacco: Never Used  Vaping Use  . Vaping Use: Never used  Substance Use Topics  . Alcohol use: No  . Drug use: No    Home Medications Prior to Admission medications   Medication Sig Start Date End Date Taking? Authorizing Provider  dicyclomine (BENTYL) 20 MG tablet Take 1 tablet (20 mg total) by mouth 2 (two) times daily. 01/24/21  Yes Bertil Brickey A, PA-C  metoCLOPramide (REGLAN) 10 MG tablet Take 1 tablet (10 mg total) by mouth every 6 (six) hours. 01/24/21  Yes Virgen Belland A, PA-C  albuterol (VENTOLIN HFA) 108 (90 Base) MCG/ACT inhaler Inhale 1-2 puffs into the  lungs every 6 (six) hours as needed for wheezing or shortness of breath.    [provider]  lidocaine (XYLOCAINE) 2 % solution Use as directed 15 mLs in the mouth or throat as needed for mouth pain. 12/06/20   Fatima Blank, MD  cetirizine (ZYRTEC ALLERGY) 10 MG tablet Take 1 tablet (10 mg total) by mouth 2 (two) times daily. Patient not taking: Reported on 12/06/2020 11/12/20 01/24/21  Muthersbaugh, Jarrett Soho, PA-C    Allergies    Iodinated diagnostic agents, Lupine bean extract, Ceftriaxone, and Morphine  Review of Systems   Review of Systems  Constitutional: Positive for chills. Negative for activity change and fever.  HENT: Negative for congestion, sinus pressure, sinus pain, sore throat and voice change.   Eyes: Negative for visual disturbance.  Respiratory: Negative for shortness of breath.   Cardiovascular: Negative for chest pain.  Gastrointestinal: Positive for abdominal pain, nausea and vomiting. Negative for diarrhea.  Genitourinary: Negative for dysuria, frequency, hematuria, urgency, vaginal bleeding, vaginal discharge and vaginal pain.  Musculoskeletal: Negative for back pain.  Skin: Negative for rash and wound.  Allergic/Immunologic: Negative for immunocompromised state.  Neurological: Negative for seizures, syncope, weakness, numbness and headaches.  Psychiatric/Behavioral: Negative for confusion.    Physical Exam Updated Vital Signs BP 128/82   Pulse 82   Temp 97.8 F (36.6 C) (Oral)   Resp 18   SpO2 100%   Physical Exam Vitals and nursing note reviewed.  Constitutional:      General: She is not in acute distress. HENT:     Head: Normocephalic.  Eyes:     Conjunctiva/sclera: Conjunctivae normal.  Cardiovascular:     Rate and Rhythm: Normal rate and regular rhythm.     Pulses: Normal pulses.     Heart sounds: Normal heart sounds. No murmur heard. No friction rub. No gallop.   Pulmonary:     Effort: Pulmonary effort is normal. No respiratory  distress.     Breath sounds: No stridor. No wheezing, rhonchi or rales.  Chest:     Chest wall: No tenderness.  Abdominal:     General: There is no distension.     Palpations: Abdomen is soft. There is no mass.     Tenderness: There is abdominal tenderness. There is no right CVA tenderness, left CVA tenderness, guarding or rebound.     Hernia: No hernia is present.     Comments: Tender to palpation in the left upper and lower abdomen.  No rebound or guarding.  Normoactive bowel sounds.  Abdomen is soft and nondistended.  No CVA tenderness bilaterally.  No pelvic tenderness.  Negative Murphy sign.  No tenderness over McBurney's point.  Musculoskeletal:  Cervical back: Neck supple.     Right lower leg: No edema.     Left lower leg: No edema.  Skin:    General: Skin is warm.     Findings: No rash.  Neurological:     Mental Status: She is alert.  Psychiatric:        Behavior: Behavior normal.     ED Results / Procedures / Treatments   Labs (all labs ordered are listed, but only abnormal results are displayed) Labs Reviewed  CBC WITH DIFFERENTIAL/PLATELET - Abnormal; Notable for the following components:      Result Value   WBC 11.5 (*)    Neutro Abs 8.3 (*)    All other components within normal limits  COMPREHENSIVE METABOLIC PANEL - Abnormal; Notable for the following components:   Potassium 3.4 (*)    Glucose, Bld 110 (*)    All other components within normal limits  URINALYSIS, ROUTINE W REFLEX MICROSCOPIC - Abnormal; Notable for the following components:   Ketones, ur 5 (*)    All other components within normal limits  LIPASE, BLOOD  I-STAT BETA HCG BLOOD, ED ([facility], [facility], [facility] ONLY)    EKG None  Radiology No results found.  Procedures Procedures   Medications Ordered in ED Medications  ondansetron (ZOFRAN) injection 4 mg (4 mg Intravenous Given 01/24/21 0149)  HYDROmorphone (DILAUDID) injection 0.5 mg (0.5 mg Intravenous Given 01/24/21 0148)  dicyclomine (BENTYL)  capsule 10 mg (10 mg Oral Given 01/24/21 0334)  ketorolac (TORADOL) 30 MG/ML injection 30 mg (30 mg Intravenous Given 01/24/21 0438)  metoCLOPramide (REGLAN) injection 10 mg (10 mg Intravenous Given 01/24/21 0438)  diphenhydrAMINE (BENADRYL) injection 12.5 mg (12.5 mg Intravenous Given 01/24/21 0439)  sodium chloride 0.9 % bolus 1,000 mL (0 mLs Intravenous Stopped 01/24/21 0539)    ED Course  I have reviewed the triage vital signs and the nursing notes.  Pertinent labs & imaging results that were available during my care of the patient were reviewed by me and considered in my medical decision making (see chart for details).    MDM Rules/Calculators/A&P                          35 year old female with a history of pancreatitis, pyelonephritis, diverticulitis, angioedema, asthma, and uterine fibroids who presents to the emergency department for 3-day history of left-sided abdominal pain, nausea, and vomiting.  She also endorses chills, but no fever.  Vital signs are stable.  Of note, this is the patient's ninth visit to the ER in the last 6 months.  She has had 7 CT A/P since January 2021, including 3 in the last 5 months.  Abdominal exam is benign.  Will order basic labs, pain control, and antiemetics.  Labs have been reviewed and independently interpreted by me.  She has a minimal leukocytosis, but this is improved from previous.  Urinalysis is on remarkable.  No evidence of infection.  No metabolic derangements.  Although patient does have left lower quadrant tenderness and has a history of diverticulitis, I have a low suspicion for acute diverticulitis today.  Doubt ovarian torsion, appendicitis, cholecystitis, bowel obstruction, mesenteric ischemia, or pancreatitis.  Initially, patient reported no improvement after Dilaudid.  She was given Bentyl and reported interval improvement in her pain.  She was successfully fluid challenge after she was given Reglan.  She did continue to endorse nausea,  but has had no further episodes of vomiting.  Will discharge home with  Reglan and follow-up to GI.  All questions answered.  She is hemodynamically stable and in no acute distress.  Safe for discharge to home with outpatient follow-up as indicated.  Final Clinical Impression(s) / ED Diagnoses Final diagnoses:  Left sided abdominal pain  Nausea and vomiting, intractability of vomiting not specified, unspecified vomiting type    Rx / DC Orders ED Discharge Orders         Ordered    metoCLOPramide (REGLAN) 10 MG tablet  Every 6 hours        01/24/21 0529    dicyclomine (BENTYL) 20 MG tablet  2 times daily        01/24/21 0529           Joline Maxcy A, PA-C 01/24/21 [id number]    Ripley Fraise, MD 01/24/21 [phone]

## 2021-01-24 NOTE — ED Triage Notes (Signed)
Family is translating per patient. Patient is complaining of lower left abdominal pain. Patient was here yesterday and patient states she started having the pain and throwing up when she got home.

## 2021-01-24 NOTE — ED Notes (Signed)
Attempted to start PIV unsuccessful. Charge RN to attempt at this time.

## 2021-01-24 NOTE — Discharge Instructions (Signed)
Gracias por permitirme atenderlo [employer] [facility].  Lo vieron hoy por dolor abdominal y vmitos. Su evaluacin fue tranquilizadora en el departamento de emergencias. Se resolvieron tus vmitos. Hay muchas causas de las nuseas, pero lo ms importante es que no ests vomitando activamente para no deshidratarte.  Tome 1 tableta de Reglan cada 6 horas segn sea necesario para los vmitos.  Tome 1 tableta de Bentyl 2 veces al da segn sea necesario para el dolor abdominal.  Llame para programar una cita de seguimiento con el Dr. Michail Sermon con gastroenterologa.  Regrese al departamento de emergencias si desarrolla vmitos incontrolables a pesar de tomar Reglan, si comienza a tener fiebre con dolor intenso, si deja de orinar u otros sntomas nuevos y preocupantes.  Thank you for allowing me to care for you today in the Emergency Department.   You were seen today for abdominal pain and vomiting.  Your work-up was reassuring in the emergency department.  Your vomiting resolved.  There are many causes of nausea, but it is mostly important that you are not actively vomiting so that you do not get dehydrated.  Take 1 tablet of Reglan every 6 hours as needed for vomiting.  Take 1 tablet of Bentyl 2 times daily as needed for abdominal pain.  Call to schedule follow-up appointment with Dr. Michail Sermon with gastroenterology.  Return to the emergency department if you develop uncontrollable vomiting despite taking Reglan, if you start having fevers with severe pain, if you stop making urine, or other new, concerning symptoms.

## 2021-01-25 ENCOUNTER — Emergency Department (HOSPITAL_COMMUNITY): Payer: Self-pay

## 2021-01-25 MED ORDER — ONDANSETRON HCL 4 MG/2ML IJ SOLN
4.0000 mg | Freq: Once | INTRAMUSCULAR | Status: DC
  Filled 2021-01-25: qty 2

## 2021-01-25 MED ORDER — DICYCLOMINE HCL 10 MG PO CAPS
10.0000 mg | ORAL_CAPSULE | Freq: Once | ORAL | Status: AC
  Administered 2021-01-25: 10 mg via ORAL
  Filled 2021-01-25: qty 1

## 2021-01-25 MED ORDER — HYDROMORPHONE HCL 1 MG/ML IJ SOLN
1.0000 mg | Freq: Once | INTRAMUSCULAR | Status: DC
Start: 2021-01-25 — End: 2021-01-25
  Filled 2021-01-25: qty 1

## 2021-01-25 MED ORDER — SODIUM CHLORIDE 0.9 % IV BOLUS: 1000.0000 mL | Freq: Once | INTRAVENOUS | Status: DC

## 2021-01-25 MED ORDER — ONDANSETRON 4 MG PO TBDP
4.0000 mg | ORAL_TABLET | Freq: Once | ORAL | Status: DC
  Filled 2021-01-25: qty 1

## 2021-01-25 MED ORDER — HYDROMORPHONE HCL 2 MG/ML IJ SOLN
2.0000 mg | Freq: Once | INTRAMUSCULAR | Status: AC
Start: 2021-01-25 — End: 2021-01-25
  Administered 2021-01-25: 2 mg via INTRAMUSCULAR
  Filled 2021-01-25: qty 1

## 2021-01-25 NOTE — ED Notes (Signed)
Pt verbalized understanding of d/c and follow up care. Ambulatory with steady gait. Interpreter services used

## 2021-01-25 NOTE — Discharge Instructions (Addendum)
Follow-up with gastroenterology in the next week.

## 2021-01-25 NOTE — ED Notes (Signed)
2 RNs had unsuccessful attempts to establish PIV.

## 2021-01-25 NOTE — ED Provider Notes (Signed)
[facility] DEPT Provider Note   CSN: [id number] Arrival date & time: 01/24/21  2114     History Chief Complaint  Patient presents with  . Abdominal Pain    Lorraine [name] is a 35 y.o. female.  Patient is a 35 year old female with history of chronic abdominal pain and frequent ER visits.  She presents today with complaints of left lower quadrant pain.  This has been ongoing for the past several days.  Patient was seen here last night.  She had laboratory studies performed and was given medications, then discharged.  Since returning home, she has had vomiting and increased pain.  She denies any fevers or bloody stool or vomit.  The history is provided by the patient. The history is limited by a language barrier. A language interpreter was used.  Abdominal Pain Pain location:  LLQ Pain quality: cramping   Pain radiates to:  Does not radiate Pain severity:  Severe Timing:  Constant Chronicity:  Chronic Relieved by:  Nothing Worsened by:  Nothing Ineffective treatments:  None tried      Past Medical History:  Diagnosis Date  . Angio-edema   . Asthma    Inhaler used 01/02/16  . Diverticulitis   . Nausea & vomiting 04/02/2017  . Pancreatitis   . Pre-diabetes   . Pyelonephritis   . Urticaria     Patient Active Problem List   Diagnosis Date Noted  . Intractable vomiting   . Acute diverticulitis 09/20/2018  . Cervical radiculopathy   . Left arm weakness   . Left-sided weakness 02/19/2018  . Weakness 02/19/2018  . Rash 02/09/2018  . Chest pain 02/08/2018  . Acute pancreatitis 04/02/2017  . Abdominal pain, acute, left upper quadrant 04/02/2017  . Intractable nausea and vomiting 04/02/2017  . Asthma in adult 04/02/2017  . Obesity (BMI 30.0-34.9) 04/02/2017  . Fatty infiltration of liver 04/02/2017  . Gastroesophageal reflux disease without esophagitis   . Pre-diabetes 07/15/2015  . Diarrhea   . Hypokalemia   . Bacterial vaginosis    . Pyelonephritis 07/07/2015  . Abdominal pain, right upper quadrant   . Asthma 10/31/2014  . History of preterm delivery 10/22/2014  . Status post repeat low transverse cesarean section 09/06/2014  . Diverticulitis 09/10/2012    Past Surgical History:  Procedure Laterality Date  . CESAREAN SECTION    . CESAREAN SECTION N/A 2006  . CESAREAN SECTION N/A 09/03/2014   Procedure: CESAREAN SECTION;  Surgeon: Guss Bunde, MD;  Location: [facility];  Service: Obstetrics;  Laterality: N/A;  . CHOLECYSTECTOMY    . ESOPHAGOGASTRODUODENOSCOPY (EGD) WITH PROPOFOL Left 04/07/2017   Procedure: ESOPHAGOGASTRODUODENOSCOPY (EGD) WITH PROPOFOL;  Surgeon: Ronnette Juniper, MD;  Location: [facility];  Service: Gastroenterology;  Laterality: Left;  . TUBAL LIGATION       OB History    Gravida  5   Para  3   Term  1   Preterm  2   AB  2   Living  3     SAB  2   IAB      Ectopic      Multiple  1   Live Births  3           Family History  Problem Relation Age of Onset  . Hyperlipidemia Mother   . Diabetes Father   . Ulcers Father   . Diabetes Maternal Uncle   . Diabetes Paternal Grandmother   . Asthma Daughter   . Allergic rhinitis Neg Hx   .  Angioedema Neg Hx   . Eczema Neg Hx   . Immunodeficiency Neg Hx   . Urticaria Neg Hx     Social History   Tobacco Use  . Smoking status: Never Smoker  . Smokeless tobacco: Never Used  Vaping Use  . Vaping Use: Never used  Substance Use Topics  . Alcohol use: No  . Drug use: No    Home Medications Prior to Admission medications   Medication Sig Start Date End Date Taking? Authorizing Provider  albuterol (VENTOLIN HFA) 108 (90 Base) MCG/ACT inhaler Inhale 1-2 puffs into the lungs every 6 (six) hours as needed for wheezing or shortness of breath.   Yes [provider]  dicyclomine (BENTYL) 20 MG tablet Take 1 tablet (20 mg total) by mouth 2 (two) times daily. 01/24/21  Yes McDonald, Mia A, PA-C  metoCLOPramide (REGLAN)  10 MG tablet Take 1 tablet (10 mg total) by mouth every 6 (six) hours. 01/24/21  Yes McDonald, Mia A, PA-C  lidocaine (XYLOCAINE) 2 % solution Use as directed 15 mLs in the mouth or throat as needed for mouth pain. Patient not taking: Reported on 01/25/2021 12/06/20   Fatima Blank, MD  cetirizine (ZYRTEC ALLERGY) 10 MG tablet Take 1 tablet (10 mg total) by mouth 2 (two) times daily. Patient not taking: Reported on 12/06/2020 11/12/20 01/24/21  Muthersbaugh, Jarrett Soho, PA-C    Allergies    Iodinated diagnostic agents, Lupine bean extract, Ceftriaxone, and Morphine  Review of Systems   Review of Systems  Gastrointestinal: Positive for abdominal pain.  All other systems reviewed and are negative.   Physical Exam Updated Vital Signs BP (!) 150/105   Pulse 93   Temp 98.7 F (37.1 C) (Oral)   Resp 17   Ht 5\' 2"  (1.575 m)   Wt 79.4 kg   SpO2 100%   BMI 32.02 kg/m   Physical Exam Vitals and nursing note reviewed.  Constitutional:      General: She is not in acute distress.    Appearance: She is well-developed. She is not diaphoretic.  HENT:     Head: Normocephalic and atraumatic.  Cardiovascular:     Rate and Rhythm: Normal rate and regular rhythm.     Heart sounds: No murmur heard. No friction rub. No gallop.   Pulmonary:     Effort: Pulmonary effort is normal. No respiratory distress.     Breath sounds: Normal breath sounds. No wheezing.  Abdominal:     General: Bowel sounds are normal. There is no distension.     Palpations: Abdomen is soft.     Tenderness: There is abdominal tenderness in the left lower quadrant. There is no right CVA tenderness, left CVA tenderness, guarding or rebound.  Musculoskeletal:        General: Normal range of motion.     Cervical back: Normal range of motion and neck supple.  Skin:    General: Skin is warm and dry.  Neurological:     Mental Status: She is alert and oriented to person, place, and time.     ED Results / Procedures /  Treatments   Labs (all labs ordered are listed, but only abnormal results are displayed) Labs Reviewed  COMPREHENSIVE METABOLIC PANEL - Abnormal; Notable for the following components:      Result Value   Glucose, Bld 115 (*)    All other components within normal limits  CBC WITH DIFFERENTIAL/PLATELET - Abnormal; Notable for the following components:   WBC 10.7 (*)  All other components within normal limits  LIPASE, BLOOD    EKG None  Radiology No results found.  Procedures Procedures   Medications Ordered in ED Medications  ondansetron (ZOFRAN) injection 4 mg (has no administration in time range)  HYDROmorphone (DILAUDID) injection 1 mg (has no administration in time range)  sodium chloride 0.9 % bolus 1,000 mL (has no administration in time range)  dicyclomine (BENTYL) capsule 10 mg (has no administration in time range)  ondansetron (ZOFRAN-ODT) disintegrating tablet 4 mg (4 mg Oral Given 01/24/21 2316)    ED Course  I have reviewed the triage vital signs and the nursing notes.  Pertinent labs & imaging results that were available during my care of the patient were reviewed by me and considered in my medical decision making (see chart for details).    MDM Rules/Calculators/A&P  Patient is a 35 year old female presenting with left lower quadrant pain.  Patient has history of chronic abdominal pain and multiple visits to the ER related to this.  She was seen yesterday for the same and laboratory studies were unremarkable.  Her pain was treated and she was discharged.  She returns today stating that she is vomiting and unable to keep anything down.  She reports worsening pain.  Patient's white count minimally elevated and improving from yesterday.  Laboratory studies are otherwise unremarkable CT scan shows no evidence for acute intra-abdominal pathology.  At this point, I see no indication for further testing or admission.  Patient to be discharged.  She was advised that  she should follow-up with gastroenterology regarding these frequent flareups of unexplained abdominal pain.  Final Clinical Impression(s) / ED Diagnoses Final diagnoses:  None    Rx / DC Orders ED Discharge Orders    None       Veryl Speak, MD 01/25/21 [phone]

## 2021-06-01 ENCOUNTER — Emergency Department (HOSPITAL_BASED_OUTPATIENT_CLINIC_OR_DEPARTMENT_OTHER)
Admission: EM | Admit: 2021-06-01 | Discharge: 2021-06-01 | Disposition: A | Discharge status: Home / Self Care | Payer: Self-pay | Attending: Emergency Medicine | Admitting: Emergency Medicine

## 2021-06-01 ENCOUNTER — Encounter (HOSPITAL_BASED_OUTPATIENT_CLINIC_OR_DEPARTMENT_OTHER): Payer: Self-pay | Admitting: *Deleted

## 2021-06-01 ENCOUNTER — Other Ambulatory Visit: Payer: Self-pay

## 2021-06-01 ENCOUNTER — Emergency Department (HOSPITAL_BASED_OUTPATIENT_CLINIC_OR_DEPARTMENT_OTHER): Payer: Self-pay

## 2021-06-01 DIAGNOSIS — K5732 Diverticulitis of large intestine without perforation or abscess without bleeding: Secondary | ICD-10-CM | POA: Insufficient documentation

## 2021-06-01 DIAGNOSIS — R300 Dysuria: Secondary | ICD-10-CM | POA: Insufficient documentation

## 2021-06-01 DIAGNOSIS — R110 Nausea: Secondary | ICD-10-CM | POA: Insufficient documentation

## 2021-06-01 DIAGNOSIS — J45909 Unspecified asthma, uncomplicated: Secondary | ICD-10-CM | POA: Insufficient documentation

## 2021-06-01 DIAGNOSIS — K5792 Diverticulitis of intestine, part unspecified, without perforation or abscess without bleeding: Secondary | ICD-10-CM

## 2021-06-01 LAB — URINALYSIS, ROUTINE W REFLEX MICROSCOPIC
Bilirubin Urine: NEGATIVE
Glucose, UA: NEGATIVE mg/dL
Hgb urine dipstick: NEGATIVE
Leukocytes,Ua: NEGATIVE
Nitrite: NEGATIVE
Protein, ur: 30 mg/dL — AB
Specific Gravity, Urine: 1.041 — ABNORMAL HIGH (ref 1.005–1.030)
pH: 6 (ref 5.0–8.0)

## 2021-06-01 LAB — COMPREHENSIVE METABOLIC PANEL
ALT: 16 U/L (ref 0–44)
AST: 17 U/L (ref 15–41)
Albumin: 4.4 g/dL (ref 3.5–5.0)
Alkaline Phosphatase: 91 U/L (ref 38–126)
Anion gap: 10 (ref 5–15)
BUN: 23 mg/dL — ABNORMAL HIGH (ref 6–20)
CO2: 25 mmol/L (ref 22–32)
Calcium: 9.6 mg/dL (ref 8.9–10.3)
Chloride: 106 mmol/L (ref 98–111)
Creatinine, Ser: 0.85 mg/dL (ref 0.44–1.00)
GFR, Estimated: >60 mL/min (ref >60)
Glucose, Bld: 100 mg/dL — ABNORMAL HIGH (ref 70–99)
Potassium: 3.7 mmol/L (ref 3.5–5.1)
Sodium: 141 mmol/L (ref 135–145)
Total Bilirubin: 0.3 mg/dL (ref 0.3–1.2)
Total Protein: 7.4 g/dL (ref 6.5–8.1)

## 2021-06-01 LAB — CBC WITH DIFFERENTIAL/PLATELET
Abs Immature Granulocytes: 0.02 10*3/uL (ref 0.00–0.07)
Basophils Absolute: 0.1 10*3/uL (ref 0.0–0.1)
Basophils Relative: 1 %
Eosinophils Absolute: 0.1 10*3/uL (ref 0.0–0.5)
Eosinophils Relative: 1 %
HCT: 36.4 % (ref 36.0–46.0)
Hemoglobin: 12.4 g/dL (ref 12.0–15.0)
Immature Granulocytes: 0 %
Lymphocytes Relative: 30 %
Lymphs Abs: 2.6 10*3/uL (ref 0.7–4.0)
MCH: 30.5 pg (ref 26.0–34.0)
MCHC: 34.1 g/dL (ref 30.0–36.0)
MCV: 89.4 fL (ref 80.0–100.0)
Monocytes Absolute: 0.4 10*3/uL (ref 0.1–1.0)
Monocytes Relative: 5 %
Neutro Abs: 5.6 10*3/uL (ref 1.7–7.7)
Neutrophils Relative %: 63 %
Platelets: 278 10*3/uL (ref 150–400)
RBC: 4.07 MIL/uL (ref 3.87–5.11)
RDW: 12.5 % (ref 11.5–15.5)
WBC: 8.6 10*3/uL (ref 4.0–10.5)
nRBC: 0 % (ref 0.0–0.2)

## 2021-06-01 LAB — LIPASE, BLOOD: Lipase: 22 U/L (ref 11–51)

## 2021-06-01 LAB — PREGNANCY, URINE: Preg Test, Ur: NEGATIVE

## 2021-06-01 MED ORDER — AMOXICILLIN-POT CLAVULANATE 875-125 MG PO TABS: 1.0000 | ORAL_TABLET | Freq: Two times a day (BID) | ORAL | 0 refills | Status: DC

## 2021-06-01 MED ORDER — SODIUM CHLORIDE 0.9 % IV BOLUS
1000.0000 mL | Freq: Once | INTRAVENOUS | Status: AC
  Administered 2021-06-01: 1000 mL via INTRAVENOUS

## 2021-06-01 MED ORDER — FENTANYL CITRATE PF 50 MCG/ML IJ SOSY
25.0000 ug | PREFILLED_SYRINGE | Freq: Once | INTRAMUSCULAR | Status: AC
  Administered 2021-06-01: 25 ug via INTRAVENOUS
  Filled 2021-06-01: qty 1

## 2021-06-01 MED ORDER — FAMOTIDINE IN NACL 20-0.9 MG/50ML-% IV SOLN
20.0000 mg | Freq: Once | INTRAVENOUS | Status: AC
  Administered 2021-06-01: 20 mg via INTRAVENOUS
  Filled 2021-06-01: qty 50

## 2021-06-01 MED ORDER — ONDANSETRON HCL 4 MG/2ML IJ SOLN
4.0000 mg | Freq: Once | INTRAMUSCULAR | Status: AC
  Administered 2021-06-01: 4 mg via INTRAVENOUS
  Filled 2021-06-01: qty 2

## 2021-06-01 MED ORDER — ONDANSETRON 4 MG PO TBDP: 4.0000 mg | ORAL_TABLET | Freq: Three times a day (TID) | ORAL | 0 refills | Status: DC | PRN

## 2021-06-01 NOTE — ED Provider Notes (Addendum)
[facility] Provider Note   CSN: [id number] Arrival date & time: 06/01/21  1249     History Chief Complaint  Patient presents with   Abdominal Pain    Lorraine [name] is a 35 y.o. female with a past medical history of pancreatitis, diverticulitis, pyelonephritis presenting to the ED with a chief complaint of abdominal pain.  Since last night started having left lower quadrant abdominal pain that is worse with ambulation.  She reports associated dysuria.  Reports nausea but denies any vomiting or changes to bowel movements.  She last took Tylenol approximately 8 hours ago with only minimal improvement in her symptoms.  She has had pain related to the left lower quadrant in the past.  Prior abdominal surgeries include C-section and cholecystectomy several years ago.  Denies any sick contacts with similar symptoms, chest pain, back pain or fever. History provided using Spanish medical interpreter  Abdominal Pain Associated symptoms: dysuria and nausea   Associated symptoms: no chest pain, no chills, no constipation, no cough, no diarrhea, no fever, no hematuria, no shortness of breath, no sore throat and no vomiting       Past Medical History:  Diagnosis Date   Angio-edema    Asthma    Inhaler used 01/02/16   Diverticulitis    Nausea & vomiting 04/02/2017   Pancreatitis    Pre-diabetes    Pyelonephritis    Urticaria     Patient Active Problem List   Diagnosis Date Noted   Intractable vomiting    Acute diverticulitis 09/20/2018   Cervical radiculopathy    Left arm weakness    Left-sided weakness 02/19/2018   Weakness 02/19/2018   Rash 02/09/2018   Chest pain 02/08/2018   Acute pancreatitis 04/02/2017   Abdominal pain, acute, left upper quadrant 04/02/2017   Intractable nausea and vomiting 04/02/2017   Asthma in adult 04/02/2017   Obesity (BMI 30.0-34.9) 04/02/2017   Fatty infiltration of liver 04/02/2017   Gastroesophageal reflux disease  without esophagitis    Pre-diabetes 07/15/2015   Diarrhea    Hypokalemia    Bacterial vaginosis    Pyelonephritis 07/07/2015   Abdominal pain, right upper quadrant    Asthma 10/31/2014   History of preterm delivery 10/22/2014   Status post repeat low transverse cesarean section 09/06/2014   Diverticulitis 09/10/2012    Past Surgical History:  Procedure Laterality Date   CESAREAN SECTION     CESAREAN SECTION N/A 2006   CESAREAN SECTION N/A 09/03/2014   Procedure: CESAREAN SECTION;  Surgeon: Guss Bunde, MD;  Location: [facility];  Service: Obstetrics;  Laterality: N/A;   CHOLECYSTECTOMY     ESOPHAGOGASTRODUODENOSCOPY (EGD) WITH PROPOFOL Left 04/07/2017   Procedure: ESOPHAGOGASTRODUODENOSCOPY (EGD) WITH PROPOFOL;  Surgeon: Ronnette Juniper, MD;  Location: [facility];  Service: Gastroenterology;  Laterality: Left;   TUBAL LIGATION       OB History     Gravida  5   Para  3   Term  1   Preterm  2   AB  2   Living  3      SAB  2   IAB      Ectopic      Multiple  1   Live Births  3           Family History  Problem Relation Age of Onset   Hyperlipidemia Mother    Diabetes Father    Ulcers Father    Diabetes Maternal Uncle    Diabetes Paternal  Grandmother    Asthma Daughter    Allergic rhinitis Neg Hx    Angioedema Neg Hx    Eczema Neg Hx    Immunodeficiency Neg Hx    Urticaria Neg Hx     Social History   Tobacco Use   Smoking status: Never   Smokeless tobacco: Never  Vaping Use   Vaping Use: Never used  Substance Use Topics   Alcohol use: No   Drug use: No    Home Medications Prior to Admission medications   Medication Sig Start Date End Date Taking? Authorizing Provider  amoxicillin-clavulanate (AUGMENTIN) 875-125 MG tablet Take 1 tablet by mouth every 12 (twelve) hours. 06/01/21  Yes Nadezhda Pollitt, PA-C  ondansetron (ZOFRAN ODT) 4 MG disintegrating tablet Take 1 tablet (4 mg total) by mouth every 8 (eight) hours as needed for nausea or  vomiting. 06/01/21  Yes Obbie Lewallen, PA-C  cetirizine (ZYRTEC ALLERGY) 10 MG tablet Take 1 tablet (10 mg total) by mouth 2 (two) times daily. Patient not taking: Reported on 12/06/2020 11/12/20 01/24/21  Muthersbaugh, Jarrett Soho, PA-C    Allergies    Iodinated diagnostic agents, Lupine bean extract, Ceftriaxone, and Morphine  Review of Systems   Review of Systems  Constitutional:  Negative for appetite change, chills and fever.  HENT:  Negative for ear pain, rhinorrhea, sneezing and sore throat.   Eyes:  Negative for photophobia and visual disturbance.  Respiratory:  Negative for cough, chest tightness, shortness of breath and wheezing.   Cardiovascular:  Negative for chest pain and palpitations.  Gastrointestinal:  Positive for abdominal pain and nausea. Negative for blood in stool, constipation, diarrhea and vomiting.  Genitourinary:  Positive for dysuria. Negative for hematuria and urgency.  Musculoskeletal:  Negative for myalgias.  Skin:  Negative for rash.  Neurological:  Negative for dizziness, weakness and light-headedness.   Physical Exam Updated Vital Signs BP (!) 123/101 (BP Location: Right Arm)   Pulse 68   Temp 98.5 F (36.9 C)   Resp 20   Ht '5\' 3"'$  (1.6 m)   Wt 81.6 kg   LMP 05/17/2021   SpO2 100%   BMI 31.89 kg/m   Physical Exam Vitals and nursing note reviewed.  Constitutional:      General: She is not in acute distress.    Appearance: She is well-developed.  HENT:     Head: Normocephalic and atraumatic.     Nose: Nose normal.  Eyes:     General: No scleral icterus.       Left eye: No discharge.     Conjunctiva/sclera: Conjunctivae normal.  Cardiovascular:     Rate and Rhythm: Normal rate and regular rhythm.     Heart sounds: Normal heart sounds. No murmur heard.   No friction rub. No gallop.  Pulmonary:     Effort: Pulmonary effort is normal. No respiratory distress.     Breath sounds: Normal breath sounds.  Abdominal:     General: Bowel sounds are  normal. There is no distension.     Palpations: Abdomen is soft.     Tenderness: There is abdominal tenderness in the left lower quadrant. There is no guarding.  Musculoskeletal:        General: Normal range of motion.     Cervical back: Normal range of motion and neck supple.  Skin:    General: Skin is warm and dry.     Findings: No rash.  Neurological:     Mental Status: She is alert.  Motor: No abnormal muscle tone.     Coordination: Coordination normal.    ED Results / Procedures / Treatments   Labs (all labs ordered are listed, but only abnormal results are displayed) Labs Reviewed  COMPREHENSIVE METABOLIC PANEL - Abnormal; Notable for the following components:      Result Value   Glucose, Bld 100 (*)    BUN 23 (*)    All other components within normal limits  URINALYSIS, ROUTINE W REFLEX MICROSCOPIC - Abnormal; Notable for the following components:   Specific Gravity, Urine 1.041 (*)    Ketones, ur TRACE (*)    Protein, ur 30 (*)    Crystals PRESENT (*)    All other components within normal limits  LIPASE, BLOOD  CBC WITH DIFFERENTIAL/PLATELET  PREGNANCY, URINE    EKG None  Radiology CT Renal Stone Study  Result Date: 06/01/2021 CLINICAL DATA:  Flank pain.  Kidney stone suspected. EXAM: CT ABDOMEN AND PELVIS WITHOUT CONTRAST TECHNIQUE: Multidetector CT imaging of the abdomen and pelvis was performed following the standard protocol without IV contrast. COMPARISON:  01/25/2021 FINDINGS: Lower chest: Clear lung bases. Normal heart size without pericardial or pleural effusion. Hepatobiliary: Normal liver. Cholecystectomy, without biliary ductal dilatation. Pancreas: Normal, without mass or ductal dilatation. Spleen: Normal in size, without focal abnormality. Adrenals/Urinary Tract: Normal adrenal glands. No renal calculi or hydronephrosis. No hydroureter or ureteric calculi. No bladder calculi. Stomach/Bowel: Normal stomach, without wall thickening. Scattered colonic  diverticula. Pericolonic edema adjacent the sigmoid is mild on 67/2. No drainable fluid collection or free perforation. Normal terminal ileum and appendix. Normal small bowel. Vascular/Lymphatic: Normal caliber of the aorta and branch vessels. No abdominopelvic adenopathy. Reproductive: Normal uterus and adnexa.  Probable tubal ligation. Other: Trace free pelvic fluid is likely physiologic. Musculoskeletal: No acute osseous abnormality. IMPRESSION: 1. No urinary tract calculi or hydronephrosis. 2. Non complicated sigmoid diverticulitis. Electronically Signed   By: Abigail Miyamoto M.D.   On: 06/01/2021 19:24    Procedures Procedures   Medications Ordered in ED Medications  sodium chloride 0.9 % bolus 1,000 mL (0 mLs Intravenous Stopped 06/01/21 1910)  fentaNYL (SUBLIMAZE) injection 25 mcg (25 mcg Intravenous Given 06/01/21 1735)  ondansetron (ZOFRAN) injection 4 mg (4 mg Intravenous Given 06/01/21 1735)  famotidine (PEPCID) IVPB 20 mg premix (0 mg Intravenous Stopped 06/01/21 1813)    ED Course  I have reviewed the triage vital signs and the nursing notes.  Pertinent labs & imaging results that were available during my care of the patient were reviewed by me and considered in my medical decision making (see chart for details).  Clinical Course as of 06/01/21 1935  Thu Jun 01, 2021  1706 Ketones, ur(!): TRACE [HK]    Clinical Course User Index [HK] Delia Heady, PA-C   MDM Rules/Calculators/A&P                           35 year old female presenting to the ED for left lower quadrant pain.  Symptoms began last night but have persisted today.  Minimal improvement with Tylenol.  Denies any vomiting, changes to bowel movements but does endorse associated nausea and dysuria.  On exam there is left lower quadrant tenderness without rebound or guarding.  Her vital signs are within normal limits.  Chart review shows that she has had similar pain in the past, last 3 CT scans done in this year have all  been negative for acute abnormality.  Her lab work today  including her CMP, lipase and CBC are unremarkable.  Awaiting urinalysis, urine pregnancy and will reassess.  Urinalysis, urine pregnancy test unremarkable.  CT of abdomen pelvis shows findings consistent with noncomplicated sigmoid diverticulitis.  Suspect this is the cause of her pain.  Symptoms controlled here.  Will discharge with antibiotics and symptomatic treatment.  Advised PCP follow-up.  Return precautions given.   Patient is hemodynamically stable, in NAD, and able to ambulate in the ED. Evaluation does not show pathology that would require ongoing emergent intervention or inpatient treatment. I explained the diagnosis to the patient. Pain has been managed and has no complaints prior to discharge. Patient is comfortable with above plan and is stable for discharge at this time. All questions were answered prior to disposition. Strict return precautions for returning to the ED were discussed. Encouraged follow up with PCP.   An After Visit Summary was printed and given to the patient.   Portions of this note were generated with Lobbyist. Dictation errors may occur despite best attempts at proofreading.  Final Clinical Impression(s) / ED Diagnoses Final diagnoses:  Diverticulitis    Rx / DC Orders ED Discharge Orders          Ordered    amoxicillin-clavulanate (AUGMENTIN) 875-125 MG tablet  Every 12 hours        06/01/21 1934    ondansetron (ZOFRAN ODT) 4 MG disintegrating tablet  Every 8 hours PRN        06/01/21 1934             Delia Heady, PA-C 06/01/21 1935    Lennice Sites, DO 06/01/21 2234

## 2021-06-01 NOTE — Discharge Instructions (Signed)
Your CT scan shows that you have diverticulitis. Take the antibiotics as prescribed. Take the Zofran as needed for nausea. Make sure you are drinking plenty of fluids. Return to the ER if you start to experience worsening pain, fever, bloody stools, lightheadedness or chest pain.  Su tomografa computarizada muestra que tiene diverticulitis. Tome los antibiticos segn lo prescrito. Tome el Zofran segn sea necesario para las nuseas. Asegrese de beber muchos lquidos. Regrese a la sala de emergencias si comienza a experimentar un empeoramiento del dolor, fiebre, heces con sangre, mareos o dolor en el pecho.

## 2021-06-01 NOTE — ED Triage Notes (Signed)
Abdominal pain last night.  Denies nausea or vomiting.

## 2021-06-05 ENCOUNTER — Other Ambulatory Visit: Payer: Self-pay

## 2021-06-05 ENCOUNTER — Encounter (HOSPITAL_BASED_OUTPATIENT_CLINIC_OR_DEPARTMENT_OTHER): Payer: Self-pay | Admitting: Emergency Medicine

## 2021-06-05 DIAGNOSIS — Z5321 Procedure and treatment not carried out due to patient leaving prior to being seen by health care provider: Secondary | ICD-10-CM | POA: Insufficient documentation

## 2021-06-05 DIAGNOSIS — R1012 Left upper quadrant pain: Secondary | ICD-10-CM | POA: Insufficient documentation

## 2021-06-05 LAB — COMPREHENSIVE METABOLIC PANEL
ALT: 13 U/L (ref 0–44)
AST: 13 U/L — ABNORMAL LOW (ref 15–41)
Albumin: 4.2 g/dL (ref 3.5–5.0)
Alkaline Phosphatase: 85 U/L (ref 38–126)
Anion gap: 9 (ref 5–15)
BUN: 16 mg/dL (ref 6–20)
CO2: 23 mmol/L (ref 22–32)
Calcium: 9.1 mg/dL (ref 8.9–10.3)
Chloride: 106 mmol/L (ref 98–111)
Creatinine, Ser: 0.58 mg/dL (ref 0.44–1.00)
GFR, Estimated: >60 mL/min (ref >60)
Glucose, Bld: 106 mg/dL — ABNORMAL HIGH (ref 70–99)
Potassium: 3.3 mmol/L — ABNORMAL LOW (ref 3.5–5.1)
Sodium: 138 mmol/L (ref 135–145)
Total Bilirubin: 0.2 mg/dL — ABNORMAL LOW (ref 0.3–1.2)
Total Protein: 6.9 g/dL (ref 6.5–8.1)

## 2021-06-05 LAB — URINALYSIS, ROUTINE W REFLEX MICROSCOPIC
Bilirubin Urine: NEGATIVE
Glucose, UA: NEGATIVE mg/dL
Hgb urine dipstick: NEGATIVE
Ketones, ur: NEGATIVE mg/dL
Leukocytes,Ua: NEGATIVE
Nitrite: NEGATIVE
Protein, ur: NEGATIVE mg/dL
Specific Gravity, Urine: 1.024 (ref 1.005–1.030)
pH: 7.5 (ref 5.0–8.0)

## 2021-06-05 LAB — CBC
HCT: 34.7 % — ABNORMAL LOW (ref 36.0–46.0)
Hemoglobin: 11.5 g/dL — ABNORMAL LOW (ref 12.0–15.0)
MCH: 29.7 pg (ref 26.0–34.0)
MCHC: 33.1 g/dL (ref 30.0–36.0)
MCV: 89.7 fL (ref 80.0–100.0)
Platelets: 275 10*3/uL (ref 150–400)
RBC: 3.87 MIL/uL (ref 3.87–5.11)
RDW: 12.4 % (ref 11.5–15.5)
WBC: 9.1 10*3/uL (ref 4.0–10.5)
nRBC: 0 % (ref 0.0–0.2)

## 2021-06-05 LAB — LIPASE, BLOOD: Lipase: 29 U/L (ref 11–51)

## 2021-06-05 LAB — PREGNANCY, URINE: Preg Test, Ur: NEGATIVE

## 2021-06-05 NOTE — ED Triage Notes (Signed)
Pt presents to ED Pov. Pt c/o LUQ abd pain. Pt reports pain worsens while walking. Pt reports pain has worsened since d/c on 9/15.

## 2021-06-06 ENCOUNTER — Emergency Department (HOSPITAL_BASED_OUTPATIENT_CLINIC_OR_DEPARTMENT_OTHER)
Admission: EM | Admit: 2021-06-06 | Discharge: 2021-06-06 | Disposition: A | Discharge status: Home / Self Care | Payer: Self-pay | Attending: Emergency Medicine | Admitting: Emergency Medicine

## 2021-06-06 ENCOUNTER — Emergency Department (HOSPITAL_BASED_OUTPATIENT_CLINIC_OR_DEPARTMENT_OTHER): Payer: Self-pay

## 2021-06-06 NOTE — ED Notes (Signed)
Notified by CT staff that pt was no longer in lobby. Called by this RN x2. Not found

## 2021-06-13 ENCOUNTER — Emergency Department (HOSPITAL_BASED_OUTPATIENT_CLINIC_OR_DEPARTMENT_OTHER)
Admission: EM | Admit: 2021-06-13 | Discharge: 2021-06-14 | Disposition: A | Discharge status: Home / Self Care | Payer: Self-pay | Attending: Emergency Medicine | Admitting: Emergency Medicine

## 2021-06-13 ENCOUNTER — Other Ambulatory Visit: Payer: Self-pay

## 2021-06-13 ENCOUNTER — Encounter (HOSPITAL_BASED_OUTPATIENT_CLINIC_OR_DEPARTMENT_OTHER): Payer: Self-pay | Admitting: *Deleted

## 2021-06-13 ENCOUNTER — Emergency Department (HOSPITAL_BASED_OUTPATIENT_CLINIC_OR_DEPARTMENT_OTHER): Payer: Self-pay

## 2021-06-13 DIAGNOSIS — K5792 Diverticulitis of intestine, part unspecified, without perforation or abscess without bleeding: Secondary | ICD-10-CM

## 2021-06-13 DIAGNOSIS — E876 Hypokalemia: Secondary | ICD-10-CM | POA: Insufficient documentation

## 2021-06-13 DIAGNOSIS — D72829 Elevated white blood cell count, unspecified: Secondary | ICD-10-CM | POA: Insufficient documentation

## 2021-06-13 DIAGNOSIS — K5732 Diverticulitis of large intestine without perforation or abscess without bleeding: Secondary | ICD-10-CM | POA: Insufficient documentation

## 2021-06-13 LAB — CBC WITH DIFFERENTIAL/PLATELET
Abs Immature Granulocytes: 0.04 10*3/uL (ref 0.00–0.07)
Basophils Absolute: 0.1 10*3/uL (ref 0.0–0.1)
Basophils Relative: 0 %
Eosinophils Absolute: 0.2 10*3/uL (ref 0.0–0.5)
Eosinophils Relative: 1 %
HCT: 36 % (ref 36.0–46.0)
Hemoglobin: 12 g/dL (ref 12.0–15.0)
Immature Granulocytes: 0 %
Lymphocytes Relative: 23 %
Lymphs Abs: 3 10*3/uL (ref 0.7–4.0)
MCH: 29.6 pg (ref 26.0–34.0)
MCHC: 33.3 g/dL (ref 30.0–36.0)
MCV: 88.9 fL (ref 80.0–100.0)
Monocytes Absolute: 0.6 10*3/uL (ref 0.1–1.0)
Monocytes Relative: 5 %
Neutro Abs: 9.1 10*3/uL — ABNORMAL HIGH (ref 1.7–7.7)
Neutrophils Relative %: 71 %
Platelets: 294 10*3/uL (ref 150–400)
RBC: 4.05 MIL/uL (ref 3.87–5.11)
RDW: 12.7 % (ref 11.5–15.5)
WBC: 13 10*3/uL — ABNORMAL HIGH (ref 4.0–10.5)
nRBC: 0 % (ref 0.0–0.2)

## 2021-06-13 LAB — COMPREHENSIVE METABOLIC PANEL
ALT: 15 U/L (ref 0–44)
AST: 17 U/L (ref 15–41)
Albumin: 4.5 g/dL (ref 3.5–5.0)
Alkaline Phosphatase: 108 U/L (ref 38–126)
Anion gap: 9 (ref 5–15)
BUN: 20 mg/dL (ref 6–20)
CO2: 27 mmol/L (ref 22–32)
Calcium: 9.5 mg/dL (ref 8.9–10.3)
Chloride: 104 mmol/L (ref 98–111)
Creatinine, Ser: 0.77 mg/dL (ref 0.44–1.00)
GFR, Estimated: >60 mL/min (ref >60)
Glucose, Bld: 97 mg/dL (ref 70–99)
Potassium: 3.1 mmol/L — ABNORMAL LOW (ref 3.5–5.1)
Sodium: 140 mmol/L (ref 135–145)
Total Bilirubin: 0.3 mg/dL (ref 0.3–1.2)
Total Protein: 7.6 g/dL (ref 6.5–8.1)

## 2021-06-13 LAB — HCG, SERUM, QUALITATIVE: Preg, Serum: NEGATIVE

## 2021-06-13 LAB — LIPASE, BLOOD: Lipase: 15 U/L (ref 11–51)

## 2021-06-13 MED ORDER — KETOROLAC TROMETHAMINE 15 MG/ML IJ SOLN
15.0000 mg | Freq: Once | INTRAMUSCULAR | Status: AC
  Administered 2021-06-13: 15 mg via INTRAVENOUS
  Filled 2021-06-13: qty 1

## 2021-06-13 MED ORDER — HYDROCODONE-ACETAMINOPHEN 5-325 MG PO TABS: 2.0000 | ORAL_TABLET | ORAL | 0 refills | Status: DC | PRN

## 2021-06-13 MED ORDER — AMOXICILLIN-POT CLAVULANATE 875-125 MG PO TABS: 1.0000 | ORAL_TABLET | Freq: Two times a day (BID) | ORAL | 0 refills | Status: DC

## 2021-06-13 MED ORDER — ONDANSETRON HCL 4 MG/2ML IJ SOLN
4.0000 mg | Freq: Once | INTRAMUSCULAR | Status: AC
  Administered 2021-06-13: 4 mg via INTRAVENOUS
  Filled 2021-06-13: qty 2

## 2021-06-13 MED ORDER — ONDANSETRON HCL 4 MG PO TABS: 4.0000 mg | ORAL_TABLET | Freq: Three times a day (TID) | ORAL | 0 refills | Status: DC | PRN

## 2021-06-13 MED ORDER — SODIUM CHLORIDE 0.9 % IV BOLUS
1000.0000 mL | Freq: Once | INTRAVENOUS | Status: AC
  Administered 2021-06-13: 1000 mL via INTRAVENOUS

## 2021-06-13 NOTE — ED Provider Notes (Signed)
[facility] Provider Note   CSN: [id number] Arrival date & time: 06/13/21  1706     History Chief Complaint  Patient presents with  . Abdominal Pain    Lorraine [name] is a 35 y.o. female.  35 yo female with history of pancreatitis, diverticulitis presents to ED with acute LLQ abdominal pain.  History of uncomplicated diverticulitis.  Multiple ER evaluations with similar complaint.  Abdominal surgical history includes cholecystectomy, c-section.  No changes to bowel or bladder function.  No fevers or chills.  No sick contacts or recent travel.  Pain is sharp, stabbing, nonradiating.  Localized left lower quadrant.  Worsened with ambulation, direct palpation, twisting.  Alleviated by rest.  No medications prior to arrival.  Reduced oral intake over the past 24 hours secondary to nausea and vomiting.  Onset of symptoms approximately 36 hours ago. No change to bowel or bladder function, no blood in stool or melena.   Offered translation services but prefers to have daughter at bedside translate for her.   The history is provided by the patient and a relative. The history is limited by a developmental delay. No language interpreter was used.  Abdominal Pain Associated symptoms: nausea and vomiting   Associated symptoms: no chest pain, no chills, no cough, no fever, no hematuria and no shortness of breath       Past Medical History:  Diagnosis Date  . Angio-edema   . Asthma    Inhaler used 01/02/16  . Diverticulitis   . Nausea & vomiting 04/02/2017  . Pancreatitis   . Pre-diabetes   . Pyelonephritis   . Urticaria     Patient Active Problem List   Diagnosis Date Noted  . Intractable vomiting   . Acute diverticulitis 09/20/2018  . Cervical radiculopathy   . Left arm weakness   . Left-sided weakness 02/19/2018  . Weakness 02/19/2018  . Rash 02/09/2018  . Chest pain 02/08/2018  . Acute pancreatitis 04/02/2017  . Abdominal pain, acute, left upper  quadrant 04/02/2017  . Intractable nausea and vomiting 04/02/2017  . Asthma in adult 04/02/2017  . Obesity (BMI 30.0-34.9) 04/02/2017  . Fatty infiltration of liver 04/02/2017  . Gastroesophageal reflux disease without esophagitis   . Pre-diabetes 07/15/2015  . Diarrhea   . Hypokalemia   . Bacterial vaginosis   . Pyelonephritis 07/07/2015  . Abdominal pain, right upper quadrant   . Asthma 10/31/2014  . History of preterm delivery 10/22/2014  . Status post repeat low transverse cesarean section 09/06/2014  . Diverticulitis 09/10/2012    Past Surgical History:  Procedure Laterality Date  . CESAREAN SECTION    . CESAREAN SECTION N/A 2006  . CESAREAN SECTION N/A 09/03/2014   Procedure: CESAREAN SECTION;  Surgeon: Guss Bunde, MD;  Location: [facility];  Service: Obstetrics;  Laterality: N/A;  . CHOLECYSTECTOMY    . ESOPHAGOGASTRODUODENOSCOPY (EGD) WITH PROPOFOL Left 04/07/2017   Procedure: ESOPHAGOGASTRODUODENOSCOPY (EGD) WITH PROPOFOL;  Surgeon: Ronnette Juniper, MD;  Location: [facility];  Service: Gastroenterology;  Laterality: Left;  . TUBAL LIGATION       OB History     Gravida  5   Para  3   Term  1   Preterm  2   AB  2   Living  3      SAB  2   IAB      Ectopic      Multiple  1   Live Births  3  Family History  Problem Relation Age of Onset  . Hyperlipidemia Mother   . Diabetes Father   . Ulcers Father   . Diabetes Maternal Uncle   . Diabetes Paternal Grandmother   . Asthma Daughter   . Allergic rhinitis Neg Hx   . Angioedema Neg Hx   . Eczema Neg Hx   . Immunodeficiency Neg Hx   . Urticaria Neg Hx     Social History   Tobacco Use  . Smoking status: Never  . Smokeless tobacco: Never  Vaping Use  . Vaping Use: Never used  Substance Use Topics  . Alcohol use: No  . Drug use: No    Home Medications Prior to Admission medications   Medication Sig Start Date End Date Taking? Authorizing Provider  amoxicillin-clavulanate  (AUGMENTIN) 875-125 MG tablet Take 1 tablet by mouth every 12 (twelve) hours. 06/13/21  Yes Jeanell Sparrow, DO  HYDROcodone-acetaminophen (NORCO/VICODIN) 5-325 MG tablet Take 2 tablets by mouth every 4 (four) hours as needed. 06/13/21  Yes Wynona Dove A, DO  ondansetron (ZOFRAN) 4 MG tablet Take 1 tablet (4 mg total) by mouth every 8 (eight) hours as needed for nausea or vomiting. 06/13/21  Yes Wynona Dove A, DO  potassium chloride SA (KLOR-CON) 20 MEQ tablet Take 1 tablet (20 mEq total) by mouth daily for 3 days. 06/14/21 06/17/21 Yes Wynona Dove A, DO  ondansetron (ZOFRAN ODT) 4 MG disintegrating tablet Take 1 tablet (4 mg total) by mouth every 8 (eight) hours as needed for nausea or vomiting. 06/01/21   Khatri, Hina, PA-C  cetirizine (ZYRTEC ALLERGY) 10 MG tablet Take 1 tablet (10 mg total) by mouth 2 (two) times daily. Patient not taking: Reported on 12/06/2020 11/12/20 01/24/21  Muthersbaugh, Jarrett Soho, PA-C    Allergies    Iodinated diagnostic agents, Lupine bean extract, Ceftriaxone, and Morphine  Review of Systems   Review of Systems  Constitutional:  Negative for chills and fever.  HENT:  Negative for facial swelling and trouble swallowing.   Eyes:  Negative for photophobia and visual disturbance.  Respiratory:  Negative for cough and shortness of breath.   Cardiovascular:  Negative for chest pain and palpitations.  Gastrointestinal:  Positive for abdominal pain, nausea and vomiting. Negative for blood in stool.  Endocrine: Negative for polydipsia and polyuria.  Genitourinary:  Negative for difficulty urinating and hematuria.  Musculoskeletal:  Negative for gait problem and joint swelling.  Skin:  Negative for pallor and rash.  Neurological:  Negative for syncope and headaches.  Psychiatric/Behavioral:  Negative for agitation and confusion.    Physical Exam Updated Vital Signs BP 100/88 (BP Location: Right Arm)   Pulse 66   Temp 99.4 F (37.4 C) (Oral)   Resp 16   Ht 5\' 3"  (1.6 m)    Wt 81.6 kg   LMP 05/17/2021 Comment: negative HCG  SpO2 100%   BMI 31.89 kg/m   Physical Exam Vitals and nursing note reviewed.  Constitutional:      General: She is not in acute distress.    Appearance: Normal appearance.  HENT:     Head: Normocephalic and atraumatic.     Right Ear: External ear normal.     Left Ear: External ear normal.     Nose: Nose normal.     Mouth/Throat:     Mouth: Mucous membranes are moist.  Eyes:     General: No scleral icterus.       Right eye: No discharge.  Left eye: No discharge.  Cardiovascular:     Rate and Rhythm: Normal rate and regular rhythm.     Pulses: Normal pulses.     Heart sounds: Normal heart sounds.  Pulmonary:     Effort: Pulmonary effort is normal. No respiratory distress.     Breath sounds: Normal breath sounds.  Abdominal:     General: Abdomen is flat.     Tenderness: There is abdominal tenderness in the left lower quadrant. There is no guarding or rebound.  Musculoskeletal:        General: Normal range of motion.     Cervical back: Normal range of motion.     Right lower leg: No edema.     Left lower leg: No edema.  Skin:    General: Skin is warm and dry.     Capillary Refill: Capillary refill takes less than 2 seconds.  Neurological:     Mental Status: She is alert.  Psychiatric:        Mood and Affect: Mood normal.        Behavior: Behavior normal.    ED Results / Procedures / Treatments   Labs (all labs ordered are listed, but only abnormal results are displayed) Labs Reviewed  CBC WITH DIFFERENTIAL/PLATELET - Abnormal; Notable for the following components:      Result Value   WBC 13.0 (*)    Neutro Abs 9.1 (*)    All other components within normal limits  COMPREHENSIVE METABOLIC PANEL - Abnormal; Notable for the following components:   Potassium 3.1 (*)    All other components within normal limits  LIPASE, BLOOD  HCG, SERUM, QUALITATIVE  URINALYSIS, ROUTINE W REFLEX MICROSCOPIC     EKG None  Radiology CT ABDOMEN PELVIS WO CONTRAST  Result Date: 06/13/2021 CLINICAL DATA:  LLQ abdominal pain hx diverticulitis, pain acutely worsened, no tolerating PO EXAM: CT ABDOMEN AND PELVIS WITHOUT CONTRAST TECHNIQUE: Multidetector CT imaging of the abdomen and pelvis was performed following the standard protocol without IV contrast. COMPARISON:  CT renal 06/01/2021 FINDINGS: Lower chest: No acute abnormality. Hepatobiliary: No focal liver abnormality. Status post cholecystectomy. No biliary dilatation. Pancreas: No focal lesion. Normal pancreatic contour. No surrounding inflammatory changes. No main pancreatic ductal dilatation. Spleen: Normal in size without focal abnormality. Adrenals/Urinary Tract: No adrenal nodule bilaterally. No nephrolithiasis and no hydronephrosis. No definite contour-deforming renal mass. No ureterolithiasis or hydroureter. The urinary bladder is unremarkable. Stomach/Bowel: Stomach is within normal limits. No evidence of bowel wall thickening or dilatation. Scattered colonic diverticulosis. Distal descending/proximal sigmoid colon bowel wall thickening and pericolonic fat stranding. No intramural abscess formation. Appendix appears normal. Vascular/Lymphatic: No abdominal aorta or iliac aneurysm. No abdominal, pelvic, or inguinal lymphadenopathy. Reproductive: Bilateral adnexal surgical clips likely related to tubal ligation. Uterus and bilateral adnexa are unremarkable. Other: Trace free fluid within the pelvis. No intraperitoneal free gas. No organized fluid collection. Musculoskeletal: No abdominal wall hernia or abnormality. No suspicious lytic or blastic osseous lesions. No acute displaced fracture. Multilevel degenerative changes of the spine. IMPRESSION: Colonic diverticulosis with uncomplicated acute distal descending/proximal sigmoid diverticulitis. (Slightly more proximal location compared to findings on CT 06/01/2021). Electronically Signed   By: Iven Finn M.D.   On: 06/13/2021 22:37    Procedures Procedures   Medications Ordered in ED Medications  sodium chloride 0.9 % bolus 1,000 mL (0 mLs Intravenous Stopped 06/14/21 0000)  ketorolac (TORADOL) 15 MG/ML injection 15 mg (15 mg Intravenous Given 06/13/21 2146)  ondansetron (ZOFRAN) injection 4 mg (  4 mg Intravenous Given 06/13/21 2146)    ED Course  I have reviewed the triage vital signs and the nursing notes.  Pertinent labs & imaging results that were available during my care of the patient were reviewed by me and considered in my medical decision making (see chart for details).    MDM Rules/Calculators/A&P                         This patient complains of abdominal pain; this involves an extensive number of treatment options and is a complaint that carries with it a high risk of complications and Morbidity.  Tender palpation left lower quadrant.  Abdomen is not peritoneal.  Vital signs reviewed and are stable.  Serious etiologies considered.   I ordered, reviewed and interpreted labs.  Minimal leukocytosis on CBC.  Likely reactive in setting of emesis.  Potassium is mildly depleted discussed dietary changes.  I ordered medication Zofran, Toradol, IV fluids  I ordered imaging studies which included CT abdomen pelvis without IV contrast and I independently visualized and interpreted imaging which showed uncomplicated diverticulitis  Additional history obtained from family at bedside  Previous records obtained and reviewed    Patient does report her symptoms have greatly improved after intervention.  She is tolerant oral intake without difficulty.  No further nausea.  No vomiting.  Does report that she is feeling much better.  At this time it is reasonable to trial outpatient treatment for uncomplicated diverticulitis.  Recommend patient follow-up with gastroenterology for possible ongoing evaluation, colonoscopy.  Advised patient follow-up with PCP in the next few days for  repeat potassium check.  The patient improved significantly and was discharged in stable condition. Detailed discussions were had with the patient regarding current findings, and need for close f/u with PCP or on call doctor. The patient has been instructed to return immediately if the symptoms worsen in any way for re-evaluation. Patient verbalized understanding and is in agreement with current care plan. All questions answered prior to discharge.   Final Clinical Impression(s) / ED Diagnoses Final diagnoses:  Diverticulitis  Hypokalemia    Rx / DC Orders ED Discharge Orders          Ordered    potassium chloride SA (KLOR-CON) 20 MEQ tablet  Daily        06/14/21 0056    HYDROcodone-acetaminophen (NORCO/VICODIN) 5-325 MG tablet  Every 4 hours PRN        06/13/21 2303    ondansetron (ZOFRAN) 4 MG tablet  Every 8 hours PRN        06/13/21 2303    amoxicillin-clavulanate (AUGMENTIN) 875-125 MG tablet  Every 12 hours        06/13/21 2330             Jeanell Sparrow, DO 06/14/21 [id number]

## 2021-06-13 NOTE — ED Triage Notes (Signed)
Pt was diagnosed with diverticulitis and came here for LLQ pain since last night.  Denies vomiting but has been nauseous.

## 2021-06-14 MED ORDER — POTASSIUM CHLORIDE CRYS ER 20 MEQ PO TBCR: 20.0000 meq | EXTENDED_RELEASE_TABLET | Freq: Every day | ORAL | 0 refills | Status: DC

## 2021-06-14 NOTE — ED Notes (Signed)
Opened chart to clarify order for pharmacy.

## 2021-06-14 NOTE — ED Notes (Signed)
This RN presented the AVS utilizing Teachback Method. Patient verbalizes understanding of Discharge Instructions. Opportunity for Questioning and Answers were provided. Patient Discharged from ED ambulatory to Home with Family  

## 2021-08-14 ENCOUNTER — Emergency Department (HOSPITAL_COMMUNITY)
Admission: EM | Admit: 2021-08-14 | Discharge: 2021-08-15 | Disposition: A | Discharge status: Home / Self Care | Payer: Self-pay | Attending: Emergency Medicine | Admitting: Emergency Medicine

## 2021-08-14 ENCOUNTER — Encounter (HOSPITAL_COMMUNITY): Payer: Self-pay | Admitting: Emergency Medicine

## 2021-08-14 ENCOUNTER — Other Ambulatory Visit: Payer: Self-pay

## 2021-08-14 DIAGNOSIS — M549 Dorsalgia, unspecified: Secondary | ICD-10-CM | POA: Insufficient documentation

## 2021-08-14 DIAGNOSIS — Z5321 Procedure and treatment not carried out due to patient leaving prior to being seen by health care provider: Secondary | ICD-10-CM | POA: Insufficient documentation

## 2021-08-14 DIAGNOSIS — R079 Chest pain, unspecified: Secondary | ICD-10-CM | POA: Insufficient documentation

## 2021-08-14 LAB — CBC WITH DIFFERENTIAL/PLATELET
Abs Immature Granulocytes: 0.03 10*3/uL (ref 0.00–0.07)
Basophils Absolute: 0 10*3/uL (ref 0.0–0.1)
Basophils Relative: 1 %
Eosinophils Absolute: 0.1 10*3/uL (ref 0.0–0.5)
Eosinophils Relative: 1 %
HCT: 38.9 % (ref 36.0–46.0)
Hemoglobin: 12.6 g/dL (ref 12.0–15.0)
Immature Granulocytes: 0 %
Lymphocytes Relative: 35 %
Lymphs Abs: 3 10*3/uL (ref 0.7–4.0)
MCH: 29.9 pg (ref 26.0–34.0)
MCHC: 32.4 g/dL (ref 30.0–36.0)
MCV: 92.2 fL (ref 80.0–100.0)
Monocytes Absolute: 0.5 10*3/uL (ref 0.1–1.0)
Monocytes Relative: 6 %
Neutro Abs: 4.9 10*3/uL (ref 1.7–7.7)
Neutrophils Relative %: 57 %
Platelets: 268 10*3/uL (ref 150–400)
RBC: 4.22 MIL/uL (ref 3.87–5.11)
RDW: 12.6 % (ref 11.5–15.5)
WBC: 8.6 10*3/uL (ref 4.0–10.5)
nRBC: 0 % (ref 0.0–0.2)

## 2021-08-14 LAB — COMPREHENSIVE METABOLIC PANEL
ALT: 21 U/L (ref 0–44)
AST: 24 U/L (ref 15–41)
Albumin: 4.2 g/dL (ref 3.5–5.0)
Alkaline Phosphatase: 94 U/L (ref 38–126)
Anion gap: 7 (ref 5–15)
BUN: 19 mg/dL (ref 6–20)
CO2: 23 mmol/L (ref 22–32)
Calcium: 9.2 mg/dL (ref 8.9–10.3)
Chloride: 110 mmol/L (ref 98–111)
Creatinine, Ser: 0.8 mg/dL (ref 0.44–1.00)
GFR, Estimated: >60 mL/min (ref >60)
Glucose, Bld: 84 mg/dL (ref 70–99)
Potassium: 3.3 mmol/L — ABNORMAL LOW (ref 3.5–5.1)
Sodium: 140 mmol/L (ref 135–145)
Total Bilirubin: 0.4 mg/dL (ref 0.3–1.2)
Total Protein: 7.3 g/dL (ref 6.5–8.1)

## 2021-08-14 LAB — URINALYSIS, ROUTINE W REFLEX MICROSCOPIC
Bilirubin Urine: NEGATIVE
Glucose, UA: NEGATIVE mg/dL
Ketones, ur: NEGATIVE mg/dL
Leukocytes,Ua: NEGATIVE
Nitrite: NEGATIVE
Protein, ur: NEGATIVE mg/dL
Specific Gravity, Urine: 1.025 (ref 1.005–1.030)
pH: 6 (ref 5.0–8.0)

## 2021-08-14 LAB — TROPONIN I (HIGH SENSITIVITY): Troponin I (High Sensitivity): <2 ng/L (ref <18)

## 2021-08-14 NOTE — ED Notes (Signed)
Requested urine from patient. 

## 2021-08-14 NOTE — ED Triage Notes (Signed)
Patient is complaining of back pain that started at work around 12pm. Patient states it has not gotten better. She is also complaining of mid chest pain.

## 2021-08-19 ENCOUNTER — Encounter (HOSPITAL_COMMUNITY): Payer: Self-pay | Admitting: Emergency Medicine

## 2021-08-19 ENCOUNTER — Other Ambulatory Visit: Payer: Self-pay

## 2021-08-19 ENCOUNTER — Emergency Department (HOSPITAL_COMMUNITY)
Admission: EM | Admit: 2021-08-19 | Discharge: 2021-08-20 | Disposition: A | Discharge status: Home / Self Care | Payer: Self-pay | Attending: Emergency Medicine | Admitting: Emergency Medicine

## 2021-08-19 DIAGNOSIS — U071 COVID-19: Secondary | ICD-10-CM | POA: Insufficient documentation

## 2021-08-19 DIAGNOSIS — R000 Tachycardia, unspecified: Secondary | ICD-10-CM | POA: Insufficient documentation

## 2021-08-19 DIAGNOSIS — J45909 Unspecified asthma, uncomplicated: Secondary | ICD-10-CM | POA: Insufficient documentation

## 2021-08-19 DIAGNOSIS — Y990 Civilian activity done for income or pay: Secondary | ICD-10-CM | POA: Insufficient documentation

## 2021-08-19 LAB — RESP PANEL BY RT-PCR (FLU A&B, COVID) ARPGX2
Influenza A by PCR: NEGATIVE
Influenza B by PCR: NEGATIVE
SARS Coronavirus 2 by RT PCR: POSITIVE — AB

## 2021-08-19 LAB — URINALYSIS, ROUTINE W REFLEX MICROSCOPIC
Bilirubin Urine: NEGATIVE
Glucose, UA: NEGATIVE mg/dL
Hgb urine dipstick: NEGATIVE
Ketones, ur: NEGATIVE mg/dL
Leukocytes,Ua: NEGATIVE
Nitrite: NEGATIVE
Protein, ur: NEGATIVE mg/dL
Specific Gravity, Urine: 1.02 (ref 1.005–1.030)
pH: 8 (ref 5.0–8.0)

## 2021-08-19 MED ORDER — SODIUM CHLORIDE 0.9 % IV BOLUS
1000.0000 mL | Freq: Once | INTRAVENOUS | Status: AC
Start: 2021-08-19 — End: 2021-08-20
  Administered 2021-08-19: 1000 mL via INTRAVENOUS

## 2021-08-19 MED ORDER — ACETAMINOPHEN 500 MG PO TABS
1000.0000 mg | ORAL_TABLET | Freq: Once | ORAL | Status: AC
  Administered 2021-08-19: 1000 mg via ORAL
  Filled 2021-08-19: qty 2

## 2021-08-19 NOTE — ED Triage Notes (Signed)
Pt reports backache, headache, fever. Pt states she felt like she had a fever and she took ibuprofen for it.

## 2021-08-19 NOTE — ED Provider Notes (Signed)
Emergency Medicine Provider Triage Evaluation Note  Harini Dearmond , a 35 y.o. female  was evaluated in triage.  Pt complains of fever, back pain. Known injury at work around a week ago. Pain in mid back and with walking. Radiates into left leg and left leg with swelling. Denies chest pain, shortness of breath. Does endorse cough, chills. Denies nausea, vomiting. Took ibuprofen earlier for pain.  Review of Systems  Positive:  As above Negative: As above  Physical Exam  BP (!) 150/108 (BP Location: Left Arm)   Pulse (!) 128   Temp 98.1 F (36.7 C) (Oral)   Resp 20   SpO2 99%  Gen:   Awake, no distress   Resp:  Normal effort  MSK:   Moves extremities without difficulty  Other:  TTP thoracic paraspinous muscles on right side especially  Medical Decision Making  Medically screening exam initiated at 7:23 PM.  Appropriate orders placed.  Marshia Perez-Matiano was informed that the remainder of the evaluation will be completed by another provider, this initial triage assessment does not replace that evaluation, and the importance of remaining in the ED until their evaluation is complete.  Patient taken to room prior to orders being placed   Dorien Chihuahua 08/19/21 [place], Ankit, MD 08/20/21 1410

## 2021-08-19 NOTE — ED Notes (Signed)
Pt reports her son had COVID recently.

## 2021-08-20 NOTE — Discharge Instructions (Signed)
You have tested positive for COVID-19 virus.  Please continue to quarantine at home and monitor your symptoms closely. You chest x-ray was clear. Antibiotics are not helpful in treating viral infection, the virus should run its course in about 7-10 days. Please make sure you are drinking plenty of fluids. You can treat your symptoms supportively with tylenol 1000 mg and ibuprofen 600 mg every 6 hours for fevers and pains, and over the counter cough syrups and throat lozenges to help with cough. If your symptoms are not improving please follow up with you Primary doctor.   I recommend that you purchase a home pulse ox to help better monitor your oxygen at home, if you start to have increased work of breathing or shortness of breath or your oxygen drops below 90% please immediately return to the hospital for reevaluation.  If you develop persistent fevers, shortness of breath or difficulty breathing, chest pain, severe headache and neck pain, persistent nausea and vomiting or other new or concerning symptoms return to the Emergency department.

## 2021-08-30 NOTE — ED Provider Notes (Signed)
[facility] DEPT Provider Note   CSN: [id number] Arrival date & time: 08/19/21  1847     History Chief Complaint  Patient presents with   Back Pain   Headache   Fever    Lorraine [name] is a 35 y.o. female.  Lorraine [name] is a 35 y.o. female with a history of asthma, prediabetes, diverticulitis and pancreatitis, who presents to the emergency department for evaluation of fever, headache, body aches.  Patient reports that she has been dealing with a backache after a recent back injury at work but then started having more generalized body ache, fever and cough over the past 2 to 3 days.  She has not had nausea or vomiting but has had very little appetite with decreased p.o. intake.  No diarrhea or abdominal pain.  No chest pain or shortness of breath.  She took some ibuprofen earlier which seemed to help with her chills and body aches.  She reports no worsening and mild backache, no flank pain or dysuria.  Family member with recent COVID infection..  No other aggravating or alleviating factors.  The history is provided by the patient, the spouse and medical records.  Headache Associated symptoms: back pain, congestion, cough, fatigue, fever and myalgias   Associated symptoms: no diarrhea, no nausea, no numbness, no vomiting and no weakness   Fever Associated symptoms: chills, congestion, cough, headaches, myalgias and rhinorrhea   Associated symptoms: no chest pain, no diarrhea, no nausea, no rash and no vomiting       Past Medical History:  Diagnosis Date   Angio-edema    Asthma    Inhaler used 01/02/16   Diverticulitis    Nausea & vomiting 04/02/2017   Pancreatitis    Pre-diabetes    Pyelonephritis    Urticaria     Patient Active Problem List   Diagnosis Date Noted   Intractable vomiting    Acute diverticulitis 09/20/2018   Cervical radiculopathy    Left arm weakness    Left-sided weakness 02/19/2018   Weakness 02/19/2018   Rash  02/09/2018   Chest pain 02/08/2018   Acute pancreatitis 04/02/2017   Abdominal pain, acute, left upper quadrant 04/02/2017   Intractable nausea and vomiting 04/02/2017   Asthma in adult 04/02/2017   Obesity (BMI 30.0-34.9) 04/02/2017   Fatty infiltration of liver 04/02/2017   Gastroesophageal reflux disease without esophagitis    Pre-diabetes 07/15/2015   Diarrhea    Hypokalemia    Bacterial vaginosis    Pyelonephritis 07/07/2015   Abdominal pain, right upper quadrant    Asthma 10/31/2014   History of preterm delivery 10/22/2014   Status post repeat low transverse cesarean section 09/06/2014   Diverticulitis 09/10/2012    Past Surgical History:  Procedure Laterality Date   CESAREAN SECTION     CESAREAN SECTION N/A 2006   CESAREAN SECTION N/A 09/03/2014   Procedure: CESAREAN SECTION;  Surgeon: Guss Bunde, MD;  Location: [facility];  Service: Obstetrics;  Laterality: N/A;   CHOLECYSTECTOMY     ESOPHAGOGASTRODUODENOSCOPY (EGD) WITH PROPOFOL Left 04/07/2017   Procedure: ESOPHAGOGASTRODUODENOSCOPY (EGD) WITH PROPOFOL;  Surgeon: Ronnette Juniper, MD;  Location: [facility];  Service: Gastroenterology;  Laterality: Left;   TUBAL LIGATION       OB History     Gravida  5   Para  3   Term  1   Preterm  2   AB  2   Living  3      SAB  2  IAB      Ectopic      Multiple  1   Live Births  3           Family History  Problem Relation Age of Onset   Hyperlipidemia Mother    Diabetes Father    Ulcers Father    Diabetes Maternal Uncle    Diabetes Paternal Grandmother    Asthma Daughter    Allergic rhinitis Neg Hx    Angioedema Neg Hx    Eczema Neg Hx    Immunodeficiency Neg Hx    Urticaria Neg Hx     Social History   Tobacco Use   Smoking status: Never   Smokeless tobacco: Never  Vaping Use   Vaping Use: Never used  Substance Use Topics   Alcohol use: No   Drug use: No    Home Medications Prior to Admission medications   Medication Sig Start  Date End Date Taking? Authorizing Provider  amoxicillin-clavulanate (AUGMENTIN) 875-125 MG tablet Take 1 tablet by mouth every 12 (twelve) hours. 06/13/21   Jeanell Sparrow, DO  HYDROcodone-acetaminophen (NORCO/VICODIN) 5-325 MG tablet Take 2 tablets by mouth every 4 (four) hours as needed. 06/13/21   Jeanell Sparrow, DO  ondansetron (ZOFRAN ODT) 4 MG disintegrating tablet Take 1 tablet (4 mg total) by mouth every 8 (eight) hours as needed for nausea or vomiting. 06/01/21   Khatri, Hina, PA-C  ondansetron (ZOFRAN) 4 MG tablet Take 1 tablet (4 mg total) by mouth every 8 (eight) hours as needed for nausea or vomiting. 06/13/21   Wynona Dove A, DO  potassium chloride SA (KLOR-CON) 20 MEQ tablet Take 1 tablet (20 mEq total) by mouth daily for 3 days. 06/14/21 06/17/21  Jeanell Sparrow, DO  cetirizine (ZYRTEC ALLERGY) 10 MG tablet Take 1 tablet (10 mg total) by mouth 2 (two) times daily. Patient not taking: Reported on 12/06/2020 11/12/20 01/24/21  Muthersbaugh, Jarrett Soho, PA-C    Allergies    Iodinated diagnostic agents, Lupine bean extract, Ceftriaxone, and Morphine  Review of Systems   Review of Systems  Constitutional:  Positive for chills, fatigue and fever.  HENT:  Positive for congestion and rhinorrhea.   Respiratory:  Positive for cough. Negative for shortness of breath.   Cardiovascular:  Negative for chest pain.  Gastrointestinal:  Negative for abdominal distention, diarrhea, nausea and vomiting.  Musculoskeletal:  Positive for back pain and myalgias.  Skin:  Negative for rash.  Neurological:  Positive for headaches. Negative for weakness and numbness.  All other systems reviewed and are negative.  Physical Exam Updated Vital Signs BP 141/81    Pulse (!) 121    Temp 102.2 F (39 C) (Oral)    Resp 15    SpO2 97%   Physical Exam Vitals and nursing note reviewed.  Constitutional:      General: She is not in acute distress.    Appearance: She is well-developed. She is not ill-appearing or  diaphoretic.  HENT:     Head: Normocephalic and atraumatic.     Nose: Rhinorrhea present.     Mouth/Throat:     Mouth: Mucous membranes are moist.     Pharynx: Oropharynx is clear.  Eyes:     General:        Right eye: No discharge.        Left eye: No discharge.  Neck:     Comments: No rigidity Cardiovascular:     Rate and Rhythm: Regular rhythm. Tachycardia present.  Heart sounds: Normal heart sounds. No murmur heard.   No friction rub. No gallop.  Pulmonary:     Effort: Pulmonary effort is normal. No respiratory distress.     Breath sounds: Normal breath sounds.     Comments: Respirations equal and unlabored, patient able to speak in full sentences, lungs clear to auscultation bilaterally  Abdominal:     General: Bowel sounds are normal. There is no distension.     Palpations: Abdomen is soft. There is no mass.     Tenderness: There is no abdominal tenderness. There is no guarding.     Comments: Abdomen soft, nondistended, nontender to palpation in all quadrants without guarding or peritoneal signs  Musculoskeletal:        General: No deformity.     Cervical back: Neck supple.  Lymphadenopathy:     Cervical: No cervical adenopathy.  Skin:    General: Skin is warm and dry.     Capillary Refill: Capillary refill takes less than 2 seconds.  Neurological:     Mental Status: She is alert and oriented to person, place, and time.  Psychiatric:        Mood and Affect: Mood normal.        Behavior: Behavior normal.    ED Results / Procedures / Treatments   Labs (all labs ordered are listed, but only abnormal results are displayed) Labs Reviewed  RESP PANEL BY RT-PCR (FLU A&B, COVID) ARPGX2 - Abnormal; Notable for the following components:      Result Value   SARS Coronavirus 2 by RT PCR POSITIVE (*)    All other components within normal limits  URINALYSIS, ROUTINE W REFLEX MICROSCOPIC - Abnormal; Notable for the following components:   APPearance HAZY (*)    All other  components within normal limits    EKG None  Radiology No results found.  Procedures Procedures   Medications Ordered in ED Medications  acetaminophen (TYLENOL) tablet 1,000 mg (1,000 mg Oral Given 08/19/21 2118)  sodium chloride 0.9 % bolus 1,000 mL (0 mLs Intravenous Stopped 08/20/21 0044)    ED Course  I have reviewed the triage vital signs and the nursing notes.  Pertinent labs & imaging results that were available during my care of the patient were reviewed by me and considered in my medical decision making (see chart for details).    MDM Rules/Calculators/A&P                           35 y.o. female presents with 2 days of cough, bodyaches, headache and fever. Concerned for possible COVID, influenza or other viral illness.  Family member with recent COVID infection.. Fortunately patient is overall well-appearing, febrile and tachycardic on arrival but this resolved with medication. Patient with no hypoxia or increased work of breathing at rest or with activity.   Given reassuring O2 sats do not feel that chest x-ray is indicated at this time.  Patient has tested positive for COVID, negative for influenza.  Urinalysis without signs of infection.  Patient diagnosed with COVID infection today but overall symptoms appear mild and evaluation has been very reassuring today. No criteria for admission at this time. Discussed appropriate quarantine at home as well as continued symptomatic treatment. Encourage patient to purchase pulse ox for monitoring of O2 sats at home and discussed strict return precaution. Provided information for post-COVID care clinic as well. Strict return precautions discussed. Patient expresses understanding and agreement. Discharged home in  good condition.  Keigan Girten was evaluated in Emergency Department on 08/30/2021 for the symptoms described in the history of present illness. She was evaluated in the context of the global COVID-19 pandemic, which  necessitated consideration that the patient might be at risk for infection with the SARS-CoV-2 virus that causes COVID-19. Institutional protocols and algorithms that pertain to the evaluation of patients at risk for COVID-19 are in a state of rapid change based on information released by regulatory bodies including the CDC and federal and state organizations. These policies and algorithms were followed during the patient's care in the ED.  Final Clinical Impression(s) / ED Diagnoses Final diagnoses:  COVID-19 virus infection    Rx / DC Orders ED Discharge Orders     None        Janet Berlin 08/30/21 0109    Regan Lemming, MD 09/03/21 1649

## 2021-10-22 ENCOUNTER — Emergency Department (HOSPITAL_COMMUNITY): Payer: Self-pay

## 2021-10-22 ENCOUNTER — Emergency Department (HOSPITAL_COMMUNITY)
Admission: EM | Admit: 2021-10-22 | Discharge: 2021-10-22 | Disposition: A | Discharge status: Home / Self Care | Payer: Self-pay | Attending: Emergency Medicine | Admitting: Emergency Medicine

## 2021-10-22 ENCOUNTER — Encounter (HOSPITAL_COMMUNITY): Payer: Self-pay

## 2021-10-22 ENCOUNTER — Other Ambulatory Visit: Payer: Self-pay

## 2021-10-22 DIAGNOSIS — K5732 Diverticulitis of large intestine without perforation or abscess without bleeding: Secondary | ICD-10-CM | POA: Insufficient documentation

## 2021-10-22 DIAGNOSIS — K5730 Diverticulosis of large intestine without perforation or abscess without bleeding: Secondary | ICD-10-CM | POA: Insufficient documentation

## 2021-10-22 DIAGNOSIS — N9489 Other specified conditions associated with female genital organs and menstrual cycle: Secondary | ICD-10-CM | POA: Insufficient documentation

## 2021-10-22 DIAGNOSIS — K5792 Diverticulitis of intestine, part unspecified, without perforation or abscess without bleeding: Secondary | ICD-10-CM

## 2021-10-22 LAB — URINALYSIS, ROUTINE W REFLEX MICROSCOPIC
Bacteria, UA: NONE SEEN
Bilirubin Urine: NEGATIVE
Glucose, UA: NEGATIVE mg/dL
Hgb urine dipstick: NEGATIVE
Ketones, ur: NEGATIVE mg/dL
Leukocytes,Ua: NEGATIVE
Nitrite: NEGATIVE
Protein, ur: NEGATIVE mg/dL
Specific Gravity, Urine: 1.025 (ref 1.005–1.030)
pH: 6.5 (ref 5.0–8.0)

## 2021-10-22 LAB — CBC WITH DIFFERENTIAL/PLATELET
Abs Immature Granulocytes: 0.02 10*3/uL (ref 0.00–0.07)
Basophils Absolute: 0 10*3/uL (ref 0.0–0.1)
Basophils Relative: 1 %
Eosinophils Absolute: 0.1 10*3/uL (ref 0.0–0.5)
Eosinophils Relative: 1 %
HCT: 36.7 % (ref 36.0–46.0)
Hemoglobin: 12 g/dL (ref 12.0–15.0)
Immature Granulocytes: 0 %
Lymphocytes Relative: 40 %
Lymphs Abs: 3.5 10*3/uL (ref 0.7–4.0)
MCH: 29.9 pg (ref 26.0–34.0)
MCHC: 32.7 g/dL (ref 30.0–36.0)
MCV: 91.5 fL (ref 80.0–100.0)
Monocytes Absolute: 0.5 10*3/uL (ref 0.1–1.0)
Monocytes Relative: 6 %
Neutro Abs: 4.6 10*3/uL (ref 1.7–7.7)
Neutrophils Relative %: 52 %
Platelets: 256 10*3/uL (ref 150–400)
RBC: 4.01 MIL/uL (ref 3.87–5.11)
RDW: 12.6 % (ref 11.5–15.5)
WBC: 8.8 10*3/uL (ref 4.0–10.5)
nRBC: 0 % (ref 0.0–0.2)

## 2021-10-22 LAB — COMPREHENSIVE METABOLIC PANEL
ALT: 16 U/L (ref 0–44)
AST: 18 U/L (ref 15–41)
Albumin: 4 g/dL (ref 3.5–5.0)
Alkaline Phosphatase: 92 U/L (ref 38–126)
Anion gap: 5 (ref 5–15)
BUN: 15 mg/dL (ref 6–20)
CO2: 27 mmol/L (ref 22–32)
Calcium: 8.9 mg/dL (ref 8.9–10.3)
Chloride: 106 mmol/L (ref 98–111)
Creatinine, Ser: 0.63 mg/dL (ref 0.44–1.00)
GFR, Estimated: >60 mL/min (ref >60)
Glucose, Bld: 99 mg/dL (ref 70–99)
Potassium: 3.5 mmol/L (ref 3.5–5.1)
Sodium: 138 mmol/L (ref 135–145)
Total Bilirubin: 0.1 mg/dL — ABNORMAL LOW (ref 0.3–1.2)
Total Protein: 7.5 g/dL (ref 6.5–8.1)

## 2021-10-22 LAB — HCG, QUANTITATIVE, PREGNANCY: hCG, Beta Chain, Quant, S: <1 m[IU]/mL (ref <5)

## 2021-10-22 LAB — LIPASE, BLOOD: Lipase: 31 U/L (ref 11–51)

## 2021-10-22 MED ORDER — LACTATED RINGERS IV BOLUS
1000.0000 mL | Freq: Once | INTRAVENOUS | Status: AC
  Administered 2021-10-22: 1000 mL via INTRAVENOUS

## 2021-10-22 MED ORDER — OXYCODONE-ACETAMINOPHEN 5-325 MG PO TABS: 1.0000 | ORAL_TABLET | Freq: Four times a day (QID) | ORAL | 0 refills | Status: DC | PRN

## 2021-10-22 MED ORDER — METRONIDAZOLE 500 MG PO TABS: 500.0000 mg | ORAL_TABLET | Freq: Two times a day (BID) | ORAL | 0 refills | Status: DC

## 2021-10-22 MED ORDER — LACTATED RINGERS IV SOLN: INTRAVENOUS | Status: DC

## 2021-10-22 MED ORDER — ONDANSETRON HCL 4 MG/2ML IJ SOLN
4.0000 mg | Freq: Once | INTRAMUSCULAR | Status: AC
  Administered 2021-10-22: 4 mg via INTRAVENOUS
  Filled 2021-10-22: qty 2

## 2021-10-22 MED ORDER — KETOROLAC TROMETHAMINE 30 MG/ML IJ SOLN
30.0000 mg | Freq: Once | INTRAMUSCULAR | Status: AC
  Administered 2021-10-22: 30 mg via INTRAVENOUS
  Filled 2021-10-22: qty 1

## 2021-10-22 MED ORDER — CIPROFLOXACIN HCL 500 MG PO TABS: 500.0000 mg | ORAL_TABLET | Freq: Two times a day (BID) | ORAL | 0 refills | Status: DC

## 2021-10-22 MED ORDER — FENTANYL CITRATE PF 50 MCG/ML IJ SOSY
50.0000 ug | PREFILLED_SYRINGE | Freq: Once | INTRAMUSCULAR | Status: AC
  Administered 2021-10-22: 50 ug via INTRAVENOUS
  Filled 2021-10-22: qty 1

## 2021-10-22 NOTE — ED Notes (Signed)
Pt discharged. Instructions and prescriptions given. AAOX4. Pt in no apparent distress with moderate pain. The opportunity to ask questions was provided.  

## 2021-10-22 NOTE — ED Notes (Signed)
Video Interpreter Arlene [phone] used to interview the pt.

## 2021-10-22 NOTE — ED Provider Notes (Signed)
[facility] DEPT Provider Note   CSN: [id number] Arrival date & time: 10/22/21  1908     History  Chief Complaint  Patient presents with   Abdominal Pain    Lorraine [name] is a 36 y.o. female.  36 year old female presents with 24 hours of right lower quadrant pain.  Pain started gradually and has been persistent.  No associated vaginal bleeding or discharge.  No urinary symptoms.  No fever or chills.  No emesis.  Pain is worse with walking.  No treatment use prior to arrival.  An interpreter was used for this encounter      Home Medications Prior to Admission medications   Medication Sig Start Date End Date Taking? Authorizing Provider  amoxicillin-clavulanate (AUGMENTIN) 875-125 MG tablet Take 1 tablet by mouth every 12 (twelve) hours. 06/13/21   Jeanell Sparrow, DO  HYDROcodone-acetaminophen (NORCO/VICODIN) 5-325 MG tablet Take 2 tablets by mouth every 4 (four) hours as needed. 06/13/21   Jeanell Sparrow, DO  ondansetron (ZOFRAN ODT) 4 MG disintegrating tablet Take 1 tablet (4 mg total) by mouth every 8 (eight) hours as needed for nausea or vomiting. 06/01/21   Khatri, Hina, PA-C  ondansetron (ZOFRAN) 4 MG tablet Take 1 tablet (4 mg total) by mouth every 8 (eight) hours as needed for nausea or vomiting. 06/13/21   Wynona Dove A, DO  potassium chloride SA (KLOR-CON) 20 MEQ tablet Take 1 tablet (20 mEq total) by mouth daily for 3 days. 06/14/21 06/17/21  Jeanell Sparrow, DO  cetirizine (ZYRTEC ALLERGY) 10 MG tablet Take 1 tablet (10 mg total) by mouth 2 (two) times daily. Patient not taking: Reported on 12/06/2020 11/12/20 01/24/21  Muthersbaugh, Jarrett Soho, PA-C      Allergies    Iodinated contrast media, Lupine bean extract, Ceftriaxone, and Morphine    Review of Systems   Review of Systems  All other systems reviewed and are negative.  Physical Exam Updated Vital Signs BP (!) 133/94 (BP Location: Left Arm)    Pulse 76    Temp 98 F (36.7 C) (Oral)     Resp 18    Ht 1.524 m (5')    Wt 81.6 kg    SpO2 100%    BMI 35.15 kg/m  Physical Exam Vitals and nursing note reviewed.  Constitutional:      General: She is not in acute distress.    Appearance: Normal appearance. She is well-developed. She is not toxic-appearing.  HENT:     Head: Normocephalic and atraumatic.  Eyes:     General: Lids are normal.     Conjunctiva/sclera: Conjunctivae normal.     Pupils: Pupils are equal, round, and reactive to light.  Neck:     Thyroid: No thyroid mass.     Trachea: No tracheal deviation.  Cardiovascular:     Rate and Rhythm: Normal rate and regular rhythm.     Heart sounds: Normal heart sounds. No murmur heard.   No gallop.  Pulmonary:     Effort: Pulmonary effort is normal. No respiratory distress.     Breath sounds: Normal breath sounds. No stridor. No decreased breath sounds, wheezing, rhonchi or rales.  Abdominal:     General: There is no distension.     Palpations: Abdomen is soft.     Tenderness: There is abdominal tenderness in the right lower quadrant. There is guarding. There is no rebound.    Musculoskeletal:        General: No tenderness. Normal range of  motion.     Cervical back: Normal range of motion and neck supple.  Skin:    General: Skin is warm and dry.     Findings: No abrasion or rash.  Neurological:     Mental Status: She is alert and oriented to person, place, and time. Mental status is at baseline.     GCS: GCS eye subscore is 4. GCS verbal subscore is 5. GCS motor subscore is 6.     Cranial Nerves: No cranial nerve deficit.     Sensory: No sensory deficit.     Motor: Motor function is intact.  Psychiatric:        Attention and Perception: Attention normal.        Speech: Speech normal.        Behavior: Behavior normal.    ED Results / Procedures / Treatments   Labs (all labs ordered are listed, but only abnormal results are displayed) Labs Reviewed  COMPREHENSIVE METABOLIC PANEL  LIPASE, BLOOD  CBC WITH  DIFFERENTIAL/PLATELET  URINALYSIS, ROUTINE W REFLEX MICROSCOPIC  I-STAT BETA HCG BLOOD, ED ([facility], [facility], [facility] ONLY)    EKG None  Radiology No results found.  Procedures Procedures    Medications Ordered in ED Medications  lactated ringers bolus 1,000 mL (has no administration in time range)  lactated ringers infusion (has no administration in time range)  fentaNYL (SUBLIMAZE) injection 50 mcg (has no administration in time range)    ED Course/ Medical Decision Making/ A&P                           Medical Decision Making Amount and/or Complexity of Data Reviewed Labs: ordered. Radiology: ordered.  Risk Prescription drug management.  Interpreter used for this encounter and all records reviewed.  Care during in presence of patient's family. Concern for possible intra-abdominal process and therefore labs and x-rays ordered.  Differential diagnosis initial and included appendicitis versus intestinal abnormality.  Patient's abdominal CT was positive for diverticulitis.  Patient medicated for pain here and feels better.  Will place on Augmentin as well as give opiates and give referral to GI        Final Clinical Impression(s) / ED Diagnoses Final diagnoses:  None    Rx / DC Orders ED Discharge Orders     None         Lacretia Leigh, MD 10/22/21 2223

## 2021-10-22 NOTE — ED Triage Notes (Signed)
Pt reports with right lower abdominal pain since last night.

## 2021-12-16 ENCOUNTER — Encounter (HOSPITAL_BASED_OUTPATIENT_CLINIC_OR_DEPARTMENT_OTHER): Payer: Self-pay | Admitting: Emergency Medicine

## 2021-12-16 ENCOUNTER — Emergency Department (HOSPITAL_BASED_OUTPATIENT_CLINIC_OR_DEPARTMENT_OTHER)
Admission: EM | Admit: 2021-12-16 | Discharge: 2021-12-16 | Disposition: A | Discharge status: Home / Self Care | Payer: Self-pay | Attending: Emergency Medicine | Admitting: Emergency Medicine

## 2021-12-16 ENCOUNTER — Other Ambulatory Visit: Payer: Self-pay

## 2021-12-16 DIAGNOSIS — X500XXA Overexertion from strenuous movement or load, initial encounter: Secondary | ICD-10-CM | POA: Insufficient documentation

## 2021-12-16 DIAGNOSIS — S39012A Strain of muscle, fascia and tendon of lower back, initial encounter: Secondary | ICD-10-CM | POA: Insufficient documentation

## 2021-12-16 MED ORDER — CYCLOBENZAPRINE HCL 10 MG PO TABS: 10.0000 mg | ORAL_TABLET | Freq: Two times a day (BID) | ORAL | 0 refills | Status: DC | PRN

## 2021-12-16 NOTE — Discharge Instructions (Addendum)
Take ibuprofen 800 mg (4 200 mg tabs) every 8 hours for the next 3 days, then decrease as tolerated. ?Use Flexeril 10 mg up to 2 times a day for the next week ?Do not lift over 5 pounds and no bending from the waist ?Return if you are having new symptoms that we discussed such as loss of bowel or bladder control, weakness on one side or the other, or numbness in the groin area ?

## 2021-12-16 NOTE — ED Notes (Signed)
Dc instructions reviewed with patient. Patient voiced understanding. Dc with belongings.  °

## 2021-12-16 NOTE — ED Notes (Signed)
Dc instructions reviewed with patient, and her husband with translater ipad ?

## 2021-12-16 NOTE — ED Triage Notes (Signed)
Pt via pov from home with back pain x 3 days, worsening yesterday. Pt denies any injury; states she felt a "contraction" and since then has been unable to work or bend.  ? ?Pt alert & oriented, spanish interpreter used for triage. Pt moaning during triage.  ?

## 2021-12-16 NOTE — ED Provider Notes (Signed)
?[facility]PT ?Provider Note ? ? ?CSN: [id number] ?Arrival date & time: 12/16/21  4098 ? ?  ? ?History ? ?Chief Complaint  ?Patient presents with  ? Back Pain  ? ? ?Lorraine [name] is a 36 y.o. female. ? ?HPI ?36 year old female presents today complaining of low back pain.  She states this began 2 to 3 days ago.  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  She can not delineate any specific event.  There were no falls or direct trauma.  She has pain in the low back area that is nonradiating.  Is worse with movement especially with bending over.  She denies any numbness or tingling in her extremities.  She is not having any loss of bowel or bladder control, frequency of urination, or abdominal pain. ? ?  ? ?Home Medications ?Prior to Admission medications   ?Medication Sig Start Date End Date Taking? Authorizing Provider  ?cyclobenzaprine (FLEXERIL) 10 MG tablet Take 1 tablet (10 mg total) by mouth 2 (two) times daily as needed for muscle spasms. 12/16/21  Yes Pattricia Boss, MD  ?amoxicillin-clavulanate (AUGMENTIN) 875-125 MG tablet Take 1 tablet by mouth every 12 (twelve) hours. 06/13/21   Jeanell Sparrow, DO  ?ciprofloxacin (CIPRO) 500 MG tablet Take 1 tablet (500 mg total) by mouth every 12 (twelve) hours. 10/22/21   Lacretia Leigh, MD  ?HYDROcodone-acetaminophen (NORCO/VICODIN) 5-325 MG tablet Take 2 tablets by mouth every 4 (four) hours as needed. 06/13/21   Jeanell Sparrow, DO  ?metroNIDAZOLE (FLAGYL) 500 MG tablet Take 1 tablet (500 mg total) by mouth 2 (two) times daily. 10/22/21   Lacretia Leigh, MD  ?ondansetron (ZOFRAN ODT) 4 MG disintegrating tablet Take 1 tablet (4 mg total) by mouth every 8 (eight) hours as needed for nausea or vomiting. 06/01/21   Khatri, Hina, PA-C  ?ondansetron (ZOFRAN) 4 MG tablet Take 1 tablet (4 mg total) by mouth every 8 (eight) hours as needed for nausea or vomiting. 06/13/21   Jeanell Sparrow, DO  ?oxyCODONE-acetaminophen (PERCOCET/ROXICET) 5-325  MG tablet Take 1-2 tablets by mouth every 6 (six) hours as needed for severe pain. 10/22/21   Lacretia Leigh, MD  ?potassium chloride SA (KLOR-CON) 20 MEQ tablet Take 1 tablet (20 mEq total) by mouth daily for 3 days. 06/14/21 06/17/21  Jeanell Sparrow, DO  ?cetirizine (ZYRTEC ALLERGY) 10 MG tablet Take 1 tablet (10 mg total) by mouth 2 (two) times daily. ?Patient not taking: Reported on 12/06/2020 11/12/20 01/24/21  Muthersbaugh, Jarrett Soho, PA-C  ?   ? ?Allergies    ?Iodinated contrast media, Lupine bean extract, Ceftriaxone, and Morphine   ? ?Review of Systems   ?Review of Systems  ?All other systems reviewed and are negative. ? ?Physical Exam ?Updated Vital Signs ?BP 126/82 (BP Location: Right Arm)   Pulse 69   Temp 98.4 ?F (36.9 ?C) (Oral)   Resp 18   Ht 1.524 m (5')   Wt 79.8 kg   LMP 12/02/2021 (Approximate)   SpO2 99%   BMI 34.37 kg/m?  ?Physical Exam ?Vitals and nursing note reviewed.  ?Constitutional:   ?   General: She is not in acute distress. ?   Appearance: She is well-developed.  ?HENT:  ?   Head: Normocephalic and atraumatic.  ?   Right Ear: External ear normal.  ?   Left Ear: External ear normal.  ?   Nose: Nose normal.  ?Eyes:  ?   Conjunctiva/sclera: Conjunctivae normal.  ?   Pupils:  Pupils are equal, round, and reactive to light.  ?Pulmonary:  ?   Effort: Pulmonary effort is normal.  ?Musculoskeletal:     ?   General: No swelling or tenderness. Normal range of motion.  ?   Cervical back: Normal range of motion and neck supple.  ?   Comments: Back visually inspected and no obvious signs of trauma, redness, or swelling ?Mild tenderness over the lower lumbar spine ?Extremities show no obvious signs of trauma pulses are intact sensation is intact  ?Skin: ?   General: Skin is warm and dry.  ?Neurological:  ?   Mental Status: She is alert and oriented to person, place, and time.  ?   Sensory: No sensory deficit.  ?   Motor: No weakness or abnormal muscle tone.  ?   Coordination: Coordination normal.  ?    Gait: Gait normal.  ?   Deep Tendon Reflexes: Reflexes normal.  ?Psychiatric:     ?   Behavior: Behavior normal.     ?   Thought Content: Thought content normal.  ? ? ?ED Results / Procedures / Treatments   ?Labs ?(all labs ordered are listed, but only abnormal results are displayed) ?Labs Reviewed - No data to display ? ?EKG ?None ? ?Radiology ?No results found. ? ?Procedures ?Procedures  ? ? ?Medications Ordered in ED ?Medications - No data to display ? ?ED Course/ Medical Decision Making/ A&P ?  ?                        ?Medical Decision Making ?36 year old female presents today complaining of low back pain.  Pain appears musculoskeletal by history and on exam.  Differential diagnosis includes disc disease, cauda equina, infectious etiology, tumor, other masses.  I have a low index of suspicion for disc disease as patient has no radiation of pain, no symptoms of cauda equina, no history of IV drug, or cancer.  Given age, patient will be treated conservatively with nonsteroidal anti-inflammatories and musculoskeletal relaxant agents.  We have discussed work restrictions.  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 ? ?Risk ?Prescription drug management. ? ? ? ? ? ? ? ? ? ?Final Clinical Impression(s) / ED Diagnoses ?Final diagnoses:  ?Strain of lumbar region, initial encounter  ? ? ?Rx / DC Orders ?ED Discharge Orders   ? ?      Ordered  ?  cyclobenzaprine (FLEXERIL) 10 MG tablet  2 times daily PRN       ? 12/16/21 0936  ? ?  ?  ? ?  ? ? ?  ?Pattricia Boss, MD ?12/16/21 [phone] ? ?

## 2022-02-15 ENCOUNTER — Emergency Department (HOSPITAL_COMMUNITY): Payer: Self-pay

## 2022-02-15 ENCOUNTER — Other Ambulatory Visit: Payer: Self-pay

## 2022-02-15 ENCOUNTER — Encounter (HOSPITAL_COMMUNITY): Payer: Self-pay

## 2022-02-15 ENCOUNTER — Emergency Department (HOSPITAL_COMMUNITY)
Admission: EM | Admit: 2022-02-15 | Discharge: 2022-02-15 | Disposition: A | Discharge status: Home / Self Care | Payer: Self-pay | Attending: Emergency Medicine | Admitting: Emergency Medicine

## 2022-02-15 DIAGNOSIS — R197 Diarrhea, unspecified: Secondary | ICD-10-CM | POA: Insufficient documentation

## 2022-02-15 DIAGNOSIS — R1012 Left upper quadrant pain: Secondary | ICD-10-CM | POA: Insufficient documentation

## 2022-02-15 DIAGNOSIS — R112 Nausea with vomiting, unspecified: Secondary | ICD-10-CM | POA: Insufficient documentation

## 2022-02-15 LAB — CBC WITH DIFFERENTIAL/PLATELET
Abs Immature Granulocytes: 0.02 10*3/uL (ref 0.00–0.07)
Basophils Absolute: 0 10*3/uL (ref 0.0–0.1)
Basophils Relative: 1 %
Eosinophils Absolute: 0.1 10*3/uL (ref 0.0–0.5)
Eosinophils Relative: 2 %
HCT: 39.6 % (ref 36.0–46.0)
Hemoglobin: 13.2 g/dL (ref 12.0–15.0)
Immature Granulocytes: 0 %
Lymphocytes Relative: 42 %
Lymphs Abs: 3 10*3/uL (ref 0.7–4.0)
MCH: 30.3 pg (ref 26.0–34.0)
MCHC: 33.3 g/dL (ref 30.0–36.0)
MCV: 91 fL (ref 80.0–100.0)
Monocytes Absolute: 0.5 10*3/uL (ref 0.1–1.0)
Monocytes Relative: 7 %
Neutro Abs: 3.6 10*3/uL (ref 1.7–7.7)
Neutrophils Relative %: 48 %
Platelets: 248 10*3/uL (ref 150–400)
RBC: 4.35 MIL/uL (ref 3.87–5.11)
RDW: 12.2 % (ref 11.5–15.5)
WBC: 7.3 10*3/uL (ref 4.0–10.5)
nRBC: 0 % (ref 0.0–0.2)

## 2022-02-15 LAB — COMPREHENSIVE METABOLIC PANEL
ALT: 24 U/L (ref 0–44)
AST: 25 U/L (ref 15–41)
Albumin: 4.2 g/dL (ref 3.5–5.0)
Alkaline Phosphatase: 109 U/L (ref 38–126)
Anion gap: 8 (ref 5–15)
BUN: 15 mg/dL (ref 6–20)
CO2: 24 mmol/L (ref 22–32)
Calcium: 9.2 mg/dL (ref 8.9–10.3)
Chloride: 107 mmol/L (ref 98–111)
Creatinine, Ser: 0.72 mg/dL (ref 0.44–1.00)
GFR, Estimated: >60 mL/min (ref >60)
Glucose, Bld: 105 mg/dL — ABNORMAL HIGH (ref 70–99)
Potassium: 3.2 mmol/L — ABNORMAL LOW (ref 3.5–5.1)
Sodium: 139 mmol/L (ref 135–145)
Total Bilirubin: 0.4 mg/dL (ref 0.3–1.2)
Total Protein: 7.6 g/dL (ref 6.5–8.1)

## 2022-02-15 LAB — URINALYSIS, ROUTINE W REFLEX MICROSCOPIC
Bilirubin Urine: NEGATIVE
Glucose, UA: NEGATIVE mg/dL
Hgb urine dipstick: NEGATIVE
Ketones, ur: NEGATIVE mg/dL
Leukocytes,Ua: NEGATIVE
Nitrite: NEGATIVE
Protein, ur: NEGATIVE mg/dL
Specific Gravity, Urine: 1.025 (ref 1.005–1.030)
pH: 5 (ref 5.0–8.0)

## 2022-02-15 LAB — LIPASE, BLOOD: Lipase: 34 U/L (ref 11–51)

## 2022-02-15 LAB — I-STAT BETA HCG BLOOD, ED (MC, WL, AP ONLY): I-stat hCG, quantitative: <5 m[IU]/mL (ref <5)

## 2022-02-15 MED ORDER — SUCRALFATE 1 GM/10ML PO SUSP
1.0000 g | Freq: Three times a day (TID) | ORAL | Status: DC
  Administered 2022-02-15: 1 g via ORAL
  Filled 2022-02-15: qty 10

## 2022-02-15 MED ORDER — ONDANSETRON HCL 4 MG/2ML IJ SOLN
4.0000 mg | Freq: Once | INTRAMUSCULAR | Status: AC
  Administered 2022-02-15: 4 mg via INTRAVENOUS
  Filled 2022-02-15: qty 2

## 2022-02-15 MED ORDER — ONDANSETRON HCL 8 MG PO TABS: 8.0000 mg | ORAL_TABLET | Freq: Three times a day (TID) | ORAL | 0 refills | Status: DC | PRN

## 2022-02-15 MED ORDER — KETOROLAC TROMETHAMINE 30 MG/ML IJ SOLN
30.0000 mg | Freq: Once | INTRAMUSCULAR | Status: AC
  Administered 2022-02-15: 30 mg via INTRAVENOUS
  Filled 2022-02-15: qty 1

## 2022-02-15 MED ORDER — ALUM & MAG HYDROXIDE-SIMETH 200-200-20 MG/5ML PO SUSP
30.0000 mL | Freq: Once | ORAL | Status: AC
  Administered 2022-02-15: 30 mL via ORAL
  Filled 2022-02-15: qty 30

## 2022-02-15 MED ORDER — DICYCLOMINE HCL 20 MG PO TABS: 20.0000 mg | ORAL_TABLET | Freq: Two times a day (BID) | ORAL | 0 refills | Status: DC

## 2022-02-15 MED ORDER — ACETAMINOPHEN 500 MG PO TABS
1000.0000 mg | ORAL_TABLET | Freq: Once | ORAL | Status: AC
  Administered 2022-02-15: 1000 mg via ORAL
  Filled 2022-02-15: qty 2

## 2022-02-15 NOTE — ED Notes (Signed)
Pt requesting something for pain. States that her nausea is slightly improved. Dr Randal Buba notified.

## 2022-02-15 NOTE — ED Notes (Addendum)
Discharge instructions reviewed, questions answered. Rx education provided. Pt states understanding and no further questions. Pt encouraged to return for worsening of symptoms. Pt ambulatory with steady gait upon discharge. No s/s of distress noted.

## 2022-02-15 NOTE — ED Triage Notes (Signed)
Pt reports with left sided abdominal pain since yesterday. Pt states that it feels like her diverticulitis.

## 2022-02-15 NOTE — ED Provider Notes (Signed)
[facility] DEPT Provider Note   CSN: [id number] Arrival date & time: 02/15/22  0001     History  Chief Complaint  Patient presents with   Abdominal Pain   Vomiting    Lorraine [name] is a 36 y.o. female.  The history is provided by the patient. The history is limited by a language barrier. A language interpreter was used.  Abdominal Pain Pain location:  LUQ Pain quality: cramping   Pain radiates to:  Does not radiate Pain severity:  Severe Onset quality:  Gradual Duration:  1 day Timing:  Constant Progression:  Unchanged Chronicity:  New Context: retching   Relieved by:  Nothing Worsened by:  Nothing Ineffective treatments:  None tried Associated symptoms: anorexia, diarrhea, nausea and vomiting   Associated symptoms: no chest pain and no fever   Risk factors: no alcohol abuse and not pregnant       Home Medications Prior to Admission medications   Medication Sig Start Date End Date Taking? Authorizing Provider  dicyclomine (BENTYL) 20 MG tablet Take 1 tablet (20 mg total) by mouth 2 (two) times daily. 02/15/22  Yes Patryk Conant, MD  ondansetron (ZOFRAN) 8 MG tablet Take 1 tablet (8 mg total) by mouth every 8 (eight) hours as needed for nausea. 02/15/22  Yes Cassy Sprowl, MD  amoxicillin-clavulanate (AUGMENTIN) 875-125 MG tablet Take 1 tablet by mouth every 12 (twelve) hours. 06/13/21   Jeanell Sparrow, DO  ciprofloxacin (CIPRO) 500 MG tablet Take 1 tablet (500 mg total) by mouth every 12 (twelve) hours. 10/22/21   Lacretia Leigh, MD  cyclobenzaprine (FLEXERIL) 10 MG tablet Take 1 tablet (10 mg total) by mouth 2 (two) times daily as needed for muscle spasms. 12/16/21   Pattricia Boss, MD  HYDROcodone-acetaminophen (NORCO/VICODIN) 5-325 MG tablet Take 2 tablets by mouth every 4 (four) hours as needed. 06/13/21   Jeanell Sparrow, DO  metroNIDAZOLE (FLAGYL) 500 MG tablet Take 1 tablet (500 mg total) by mouth 2 (two) times daily. 10/22/21   Lacretia Leigh, MD  ondansetron (ZOFRAN ODT) 4 MG disintegrating tablet Take 1 tablet (4 mg total) by mouth every 8 (eight) hours as needed for nausea or vomiting. 06/01/21   Khatri, Hina, PA-C  ondansetron (ZOFRAN) 4 MG tablet Take 1 tablet (4 mg total) by mouth every 8 (eight) hours as needed for nausea or vomiting. 06/13/21   Jeanell Sparrow, DO  oxyCODONE-acetaminophen (PERCOCET/ROXICET) 5-325 MG tablet Take 1-2 tablets by mouth every 6 (six) hours as needed for severe pain. 10/22/21   Lacretia Leigh, MD  potassium chloride SA (KLOR-CON) 20 MEQ tablet Take 1 tablet (20 mEq total) by mouth daily for 3 days. 06/14/21 06/17/21  Jeanell Sparrow, DO  cetirizine (ZYRTEC ALLERGY) 10 MG tablet Take 1 tablet (10 mg total) by mouth 2 (two) times daily. Patient not taking: Reported on 12/06/2020 11/12/20 01/24/21  Muthersbaugh, Jarrett Soho, PA-C      Allergies    Iodinated contrast media, Lupine bean extract, Ceftriaxone, and Morphine    Review of Systems   Review of Systems  Constitutional:  Negative for fever.  HENT:  Negative for congestion and ear discharge.   Eyes:  Negative for redness.  Respiratory:  Negative for wheezing and stridor.   Cardiovascular:  Negative for chest pain.  Gastrointestinal:  Positive for abdominal pain, anorexia, diarrhea, nausea and vomiting.  All other systems reviewed and are negative.  Physical Exam Updated Vital Signs BP 120/77   Pulse 69   Temp  98 F (36.7 C) (Oral)   Resp 18   SpO2 99%  Physical Exam Vitals and nursing note reviewed.  Constitutional:      General: She is not in acute distress.    Appearance: Normal appearance. She is well-developed.  HENT:     Head: Normocephalic and atraumatic.     Nose: Nose normal.  Eyes:     Conjunctiva/sclera: Conjunctivae normal.     Pupils: Pupils are equal, round, and reactive to light.     Comments: Normal appearance  Cardiovascular:     Rate and Rhythm: Normal rate and regular rhythm.     Pulses: Normal pulses.     Heart  sounds: Normal heart sounds.  Pulmonary:     Effort: Pulmonary effort is normal. No respiratory distress.     Breath sounds: Normal breath sounds.  Abdominal:     General: Abdomen is flat. Bowel sounds are normal. There is no distension.     Palpations: Abdomen is soft. There is no mass.     Tenderness: There is no abdominal tenderness. There is no guarding or rebound.  Genitourinary:    Comments: No CVA tenderness Musculoskeletal:        General: Normal range of motion.     Cervical back: Normal range of motion.  Skin:    General: Skin is warm and dry.     Capillary Refill: Capillary refill takes less than 2 seconds.     Findings: No rash.  Neurological:     General: No focal deficit present.     Mental Status: She is alert and oriented to person, place, and time.  Psychiatric:        Behavior: Behavior normal.    ED Results / Procedures / Treatments   Labs (all labs ordered are listed, but only abnormal results are displayed) Results for orders placed or performed during the hospital encounter of 02/15/22  Comprehensive metabolic panel  Result Value Ref Range   Sodium 139 135 - 145 mmol/L   Potassium 3.2 (L) 3.5 - 5.1 mmol/L   Chloride 107 98 - 111 mmol/L   CO2 24 22 - 32 mmol/L   Glucose, Bld 105 (H) 70 - 99 mg/dL   BUN 15 6 - 20 mg/dL   Creatinine, Ser 0.72 0.44 - 1.00 mg/dL   Calcium 9.2 8.9 - 10.3 mg/dL   Total Protein 7.6 6.5 - 8.1 g/dL   Albumin 4.2 3.5 - 5.0 g/dL   AST 25 15 - 41 U/L   ALT 24 0 - 44 U/L   Alkaline Phosphatase 109 38 - 126 U/L   Total Bilirubin 0.4 0.3 - 1.2 mg/dL   GFR, Estimated >60 >60 mL/min   Anion gap 8 5 - 15  Lipase, blood  Result Value Ref Range   Lipase 34 11 - 51 U/L  CBC with Diff  Result Value Ref Range   WBC 7.3 4.0 - 10.5 K/uL   RBC 4.35 3.87 - 5.11 MIL/uL   Hemoglobin 13.2 12.0 - 15.0 g/dL   HCT 39.6 36.0 - 46.0 %   MCV 91.0 80.0 - 100.0 fL   MCH 30.3 26.0 - 34.0 pg   MCHC 33.3 30.0 - 36.0 g/dL   RDW 12.2 11.5 - 15.5 %    Platelets 248 150 - 400 K/uL   nRBC 0.0 0.0 - 0.2 %   Neutrophils Relative % 48 %   Neutro Abs 3.6 1.7 - 7.7 K/uL   Lymphocytes Relative 42 %  Lymphs Abs 3.0 0.7 - 4.0 K/uL   Monocytes Relative 7 %   Monocytes Absolute 0.5 0.1 - 1.0 K/uL   Eosinophils Relative 2 %   Eosinophils Absolute 0.1 0.0 - 0.5 K/uL   Basophils Relative 1 %   Basophils Absolute 0.0 0.0 - 0.1 K/uL   Immature Granulocytes 0 %   Abs Immature Granulocytes 0.02 0.00 - 0.07 K/uL  Urinalysis, Routine w reflex microscopic Urine, Clean Catch  Result Value Ref Range   Color, Urine YELLOW YELLOW   APPearance HAZY (A) CLEAR   Specific Gravity, Urine 1.025 1.005 - 1.030   pH 5.0 5.0 - 8.0   Glucose, UA NEGATIVE NEGATIVE mg/dL   Hgb urine dipstick NEGATIVE NEGATIVE   Bilirubin Urine NEGATIVE NEGATIVE   Ketones, ur NEGATIVE NEGATIVE mg/dL   Protein, ur NEGATIVE NEGATIVE mg/dL   Nitrite NEGATIVE NEGATIVE   Leukocytes,Ua NEGATIVE NEGATIVE  I-Stat beta hCG blood, ED  Result Value Ref Range   I-stat hCG, quantitative <5.0 <5 mIU/mL   Comment 3           CT Renal Stone Study  Result Date: 02/15/2022 CLINICAL DATA:  Left flank pain EXAM: CT ABDOMEN AND PELVIS WITHOUT CONTRAST TECHNIQUE: Multidetector CT imaging of the abdomen and pelvis was performed following the standard protocol without IV contrast. RADIATION DOSE REDUCTION: This exam was performed according to the departmental dose-optimization program which includes automated exposure control, adjustment of the mA and/or kV according to patient size and/or use of iterative reconstruction technique. COMPARISON:  10/22/2021 FINDINGS: Lower chest: No acute abnormality Hepatobiliary: No focal liver abnormality is seen. Status post cholecystectomy. No biliary dilatation. Pancreas: No focal abnormality or ductal dilatation. Spleen: No focal abnormality.  Normal size. Adrenals/Urinary Tract: No adrenal abnormality. No focal renal abnormality. No stones or hydronephrosis. Urinary  bladder is unremarkable. Stomach/Bowel: Left colonic diverticulosis. No active diverticulitis. Appendix is normal. Stomach and small bowel decompressed, unremarkable. Vascular/Lymphatic: No evidence of aneurysm or adenopathy. Reproductive: Uterus and adnexa unremarkable. No mass. Bilateral tubal ligation clips noted. Other: No free fluid or free air. Musculoskeletal: No acute bony abnormality. IMPRESSION: No renal or ureteral stones.  No hydronephrosis. No acute findings. Left colonic diverticulosis. Electronically Signed   By: Rolm Baptise M.D.   On: 02/15/2022 01:41     EKG None  Radiology CT Renal Stone Study  Result Date: 02/15/2022 CLINICAL DATA:  Left flank pain EXAM: CT ABDOMEN AND PELVIS WITHOUT CONTRAST TECHNIQUE: Multidetector CT imaging of the abdomen and pelvis was performed following the standard protocol without IV contrast. RADIATION DOSE REDUCTION: This exam was performed according to the departmental dose-optimization program which includes automated exposure control, adjustment of the mA and/or kV according to patient size and/or use of iterative reconstruction technique. COMPARISON:  10/22/2021 FINDINGS: Lower chest: No acute abnormality Hepatobiliary: No focal liver abnormality is seen. Status post cholecystectomy. No biliary dilatation. Pancreas: No focal abnormality or ductal dilatation. Spleen: No focal abnormality.  Normal size. Adrenals/Urinary Tract: No adrenal abnormality. No focal renal abnormality. No stones or hydronephrosis. Urinary bladder is unremarkable. Stomach/Bowel: Left colonic diverticulosis. No active diverticulitis. Appendix is normal. Stomach and small bowel decompressed, unremarkable. Vascular/Lymphatic: No evidence of aneurysm or adenopathy. Reproductive: Uterus and adnexa unremarkable. No mass. Bilateral tubal ligation clips noted. Other: No free fluid or free air. Musculoskeletal: No acute bony abnormality. IMPRESSION: No renal or ureteral stones.  No  hydronephrosis. No acute findings. Left colonic diverticulosis. Electronically Signed   By: Rolm Baptise M.D.   On: 02/15/2022  01:41    Procedures Procedures    Medications Ordered in ED Medications  sucralfate (CARAFATE) 1 GM/10ML suspension 1 g (has no administration in time range)  acetaminophen (TYLENOL) tablet 1,000 mg (has no administration in time range)  ondansetron (ZOFRAN) injection 4 mg (4 mg Intravenous Given 02/15/22 0111)  alum & mag hydroxide-simeth (MAALOX/MYLANTA) 200-200-20 MG/5ML suspension 30 mL (30 mLs Oral Given 02/15/22 0227)  ketorolac (TORADOL) 30 MG/ML injection 30 mg (30 mg Intravenous Given 02/15/22 0226)    ED Course/ Medical Decision Making/ A&P                           Medical Decision Making LUQ pain with nausea vomiting and diarrhea   Amount and/or Complexity of Data Reviewed External Data Reviewed: notes.    Details: previous notes reviewed Labs: ordered.    Details: all labs reviewed:  normal sodium 139, normal BUN 15 and creatinine .72.  Normal hepatic function panel.  Normal lipase.  Normal white count 7.3 and normal hemoglobin 13.2.  urine is without UTI Radiology: ordered and independent interpretation performed.    Details: Normal CT without stones or diverticulitis by me  Risk OTC drugs. Prescription drug management. Risk Details: Patient is here with minor daughter and patient drove.  I have given oral and IV pain medication.  I have also given antispasmotics.  There are no non verbal cues of pain.  Symptoms are consistent with viral nausea vomiting and diarrhea.  No diverticulitis.  No signs of infection or surgical abdomen.  Stable for discharge with close follow up.      Final Clinical Impression(s) / ED Diagnoses Final diagnoses:  Nausea vomiting and diarrhea   Return for intractable cough, coughing up blood, fevers > 100.4 unrelieved by medication, shortness of breath, intractable vomiting, chest pain, shortness of breath, weakness,  numbness, changes in speech, facial asymmetry, abdominal pain, passing out, Inability to tolerate liquids or food, cough, altered mental status or any concerns. No signs of systemic illness or infection. The patient is nontoxic-appearing on exam and vital signs are within normal limits.  I have reviewed the triage vital signs and the nursing notes. Pertinent labs & imaging results that were available during my care of the patient were reviewed by me and considered in my medical decision making (see chart for details). After history, exam, and medical workup I feel the patient has been appropriately medically screened and is safe for discharge home. Pertinent diagnoses were discussed with the patient. Patient was given return precautions. Rx / DC Orders ED Discharge Orders          Ordered    dicyclomine (BENTYL) 20 MG tablet  2 times daily        02/15/22 0234    ondansetron (ZOFRAN) 8 MG tablet  Every 8 hours PRN        02/15/22 0306              Suzannah Bettes, MD 02/15/22 8185

## 2022-04-15 ENCOUNTER — Emergency Department (HOSPITAL_COMMUNITY)
Admission: EM | Admit: 2022-04-15 | Discharge: 2022-04-16 | Disposition: A | Discharge status: Home / Self Care | Payer: Self-pay | Attending: Emergency Medicine | Admitting: Emergency Medicine

## 2022-04-15 ENCOUNTER — Other Ambulatory Visit: Payer: Self-pay

## 2022-04-15 ENCOUNTER — Encounter (HOSPITAL_COMMUNITY): Payer: Self-pay

## 2022-04-15 DIAGNOSIS — R109 Unspecified abdominal pain: Secondary | ICD-10-CM | POA: Insufficient documentation

## 2022-04-15 DIAGNOSIS — R519 Headache, unspecified: Secondary | ICD-10-CM | POA: Insufficient documentation

## 2022-04-15 LAB — COMPREHENSIVE METABOLIC PANEL
ALT: 21 U/L (ref 0–44)
AST: 21 U/L (ref 15–41)
Albumin: 4.1 g/dL (ref 3.5–5.0)
Alkaline Phosphatase: 83 U/L (ref 38–126)
Anion gap: 9 (ref 5–15)
BUN: 20 mg/dL (ref 6–20)
CO2: 23 mmol/L (ref 22–32)
Calcium: 9 mg/dL (ref 8.9–10.3)
Chloride: 110 mmol/L (ref 98–111)
Creatinine, Ser: 1.12 mg/dL — ABNORMAL HIGH (ref 0.44–1.00)
GFR, Estimated: >60 mL/min (ref >60)
Glucose, Bld: 101 mg/dL — ABNORMAL HIGH (ref 70–99)
Potassium: 3.6 mmol/L (ref 3.5–5.1)
Sodium: 142 mmol/L (ref 135–145)
Total Bilirubin: 0.3 mg/dL (ref 0.3–1.2)
Total Protein: 7 g/dL (ref 6.5–8.1)

## 2022-04-15 LAB — PREGNANCY, URINE: Preg Test, Ur: NEGATIVE

## 2022-04-15 LAB — URINALYSIS, ROUTINE W REFLEX MICROSCOPIC
Bilirubin Urine: NEGATIVE
Glucose, UA: NEGATIVE mg/dL
Hgb urine dipstick: NEGATIVE
Ketones, ur: NEGATIVE mg/dL
Leukocytes,Ua: NEGATIVE
Nitrite: NEGATIVE
Protein, ur: NEGATIVE mg/dL
Specific Gravity, Urine: 1.027 (ref 1.005–1.030)
pH: 6 (ref 5.0–8.0)

## 2022-04-15 LAB — CBC
HCT: 36.4 % (ref 36.0–46.0)
Hemoglobin: 12 g/dL (ref 12.0–15.0)
MCH: 30.3 pg (ref 26.0–34.0)
MCHC: 33 g/dL (ref 30.0–36.0)
MCV: 91.9 fL (ref 80.0–100.0)
Platelets: 248 10*3/uL (ref 150–400)
RBC: 3.96 MIL/uL (ref 3.87–5.11)
RDW: 12.5 % (ref 11.5–15.5)
WBC: 8.6 10*3/uL (ref 4.0–10.5)
nRBC: 0 % (ref 0.0–0.2)

## 2022-04-15 LAB — LIPASE, BLOOD: Lipase: 31 U/L (ref 11–51)

## 2022-04-15 NOTE — ED Triage Notes (Signed)
Patients stomach began hurting today in the left upper quadrant. Patient has vomited x1. No diarrhea. Complaining of headache.

## 2022-04-16 MED ORDER — DICYCLOMINE HCL 10 MG PO CAPS
10.0000 mg | ORAL_CAPSULE | Freq: Once | ORAL | Status: AC
  Administered 2022-04-16: 10 mg via ORAL
  Filled 2022-04-16: qty 1

## 2022-04-16 MED ORDER — SODIUM CHLORIDE 0.9 % IV BOLUS
1000.0000 mL | Freq: Once | INTRAVENOUS | Status: AC
  Administered 2022-04-16: 1000 mL via INTRAVENOUS

## 2022-04-16 MED ORDER — BUTALBITAL-APAP-CAFFEINE 50-325-40 MG PO TABS: 1.0000 | ORAL_TABLET | Freq: Three times a day (TID) | ORAL | 0 refills | Status: DC | PRN

## 2022-04-16 MED ORDER — AMOXICILLIN-POT CLAVULANATE 875-125 MG PO TABS
1.0000 | ORAL_TABLET | Freq: Once | ORAL | Status: AC
  Administered 2022-04-16: 1 via ORAL
  Filled 2022-04-16: qty 1

## 2022-04-16 MED ORDER — AMOXICILLIN-POT CLAVULANATE 875-125 MG PO TABS: 1.0000 | ORAL_TABLET | Freq: Two times a day (BID) | ORAL | 0 refills | Status: DC

## 2022-04-16 MED ORDER — KETOROLAC TROMETHAMINE 15 MG/ML IJ SOLN
15.0000 mg | Freq: Once | INTRAMUSCULAR | Status: AC
  Administered 2022-04-16: 15 mg via INTRAVENOUS
  Filled 2022-04-16: qty 1

## 2022-04-16 MED ORDER — OXYCODONE-ACETAMINOPHEN 5-325 MG PO TABS
1.0000 | ORAL_TABLET | Freq: Once | ORAL | Status: AC
  Administered 2022-04-16: 1 via ORAL
  Filled 2022-04-16: qty 1

## 2022-04-16 MED ORDER — KETOROLAC TROMETHAMINE 15 MG/ML IJ SOLN
15.0000 mg | Freq: Once | INTRAMUSCULAR | Status: AC
Start: 2022-04-16 — End: 2022-04-16
  Administered 2022-04-16: 15 mg via INTRAVENOUS
  Filled 2022-04-16: qty 1

## 2022-04-16 MED ORDER — METOCLOPRAMIDE HCL 5 MG/ML IJ SOLN
10.0000 mg | INTRAMUSCULAR | Status: AC
  Administered 2022-04-16: 10 mg via INTRAVENOUS
  Filled 2022-04-16: qty 2

## 2022-04-16 NOTE — ED Provider Notes (Signed)
[facility] DEPT Provider Note   CSN: [id number] Arrival date & time: 04/15/22  2005     History  Chief Complaint  Patient presents with   Abdominal Pain    Lorraine [name] is a 36 y.o. female.  36 year old female presents to the emergency department for evaluation of abdominal pain which began around noontime today.  Pain has been constant and unrelieved with an over-the-counter analgesic.  Does report ongoing nausea.  Had 1 episode of vomiting at home.  No diarrhea or bowel changes, urinary symptoms, fevers.  She also is complaining of a global headache.  Has had headaches in the past, but reports they typically resolve spontaneously.  This 1 has been more persistent.  No history of head injury or trauma.  Abdominal surgical history significant for gallbladder removal, tubal ligation, C-section x3.  The history is provided by the patient. A language interpreter was used.  Abdominal Pain      Home Medications Prior to Admission medications   Medication Sig Start Date End Date Taking? Authorizing Provider  butalbital-acetaminophen-caffeine (FIORICET) 50-325-40 MG tablet Take 1-2 tablets by mouth every 8 (eight) hours as needed for headache. 04/16/22 04/16/23 Yes Antonietta Breach, PA-C  amoxicillin-clavulanate (AUGMENTIN) 875-125 MG tablet Take 1 tablet by mouth every 12 (twelve) hours. 04/16/22   Antonietta Breach, PA-C  dicyclomine (BENTYL) 20 MG tablet Take 1 tablet (20 mg total) by mouth 2 (two) times daily. Patient not taking: Reported on 04/16/2022 02/15/22   Palumbo, April, MD  ondansetron (ZOFRAN) 8 MG tablet Take 1 tablet (8 mg total) by mouth every 8 (eight) hours as needed for nausea. Patient not taking: Reported on 04/16/2022 02/15/22   Palumbo, April, MD  potassium chloride SA (KLOR-CON) 20 MEQ tablet Take 1 tablet (20 mEq total) by mouth daily for 3 days. 06/14/21 06/17/21  Jeanell Sparrow, DO  cetirizine (ZYRTEC ALLERGY) 10 MG tablet Take 1 tablet (10 mg  total) by mouth 2 (two) times daily. Patient not taking: Reported on 12/06/2020 11/12/20 01/24/21  Muthersbaugh, Jarrett Soho, PA-C      Allergies    Iodinated contrast media, Lupine bean extract, Ceftriaxone, and Morphine    Review of Systems   Review of Systems  Gastrointestinal:  Positive for abdominal pain.  Ten systems reviewed and are negative for acute change, except as noted in the HPI.    Physical Exam Updated Vital Signs BP 109/88   Pulse 98   Temp 98.8 F (37.1 C)   Resp 17   SpO2 100%   Physical Exam Vitals and nursing note reviewed.  Constitutional:      General: She is not in acute distress.    Appearance: She is well-developed. She is not diaphoretic.     Comments: Nontoxic appearing and in NAD  HENT:     Head: Normocephalic and atraumatic.     Right Ear: External ear normal.     Left Ear: External ear normal.  Eyes:     General: No scleral icterus.    Extraocular Movements: Extraocular movements intact.     Conjunctiva/sclera: Conjunctivae normal.  Neck:     Comments: No meningismus Cardiovascular:     Rate and Rhythm: Normal rate and regular rhythm.     Pulses: Normal pulses.  Pulmonary:     Effort: Pulmonary effort is normal. No respiratory distress.     Breath sounds: No stridor. No wheezing.     Comments: Respirations even and unlabored Abdominal:     Tenderness: There is abdominal  tenderness.     Comments: Abdomen soft, nondistended. There is TTP in the left mid abdomen. No peritoneal signs.  Musculoskeletal:        General: Normal range of motion.     Cervical back: Normal range of motion.  Skin:    General: Skin is warm and dry.     Coloration: Skin is not pale.     Findings: No erythema or rash.  Neurological:     Mental Status: She is alert and oriented to person, place, and time.     Cranial Nerves: No cranial nerve deficit.     Coordination: Coordination normal.     Comments: GCS 15. Patient moving extremities spontaneously. No focal  deficits noted.  Psychiatric:        Behavior: Behavior normal.     ED Results / Procedures / Treatments   Labs (all labs ordered are listed, but only abnormal results are displayed) Labs Reviewed  COMPREHENSIVE METABOLIC PANEL - Abnormal; Notable for the following components:      Result Value   Glucose, Bld 101 (*)    Creatinine, Ser 1.12 (*)    All other components within normal limits  URINALYSIS, ROUTINE W REFLEX MICROSCOPIC - Abnormal; Notable for the following components:   APPearance HAZY (*)    All other components within normal limits  LIPASE, BLOOD  CBC  PREGNANCY, URINE    EKG None  Radiology No results found.  Procedures Procedures    Medications Ordered in ED Medications  ketorolac (TORADOL) 15 MG/ML injection 15 mg (15 mg Intravenous Given 04/16/22 0111)  sodium chloride 0.9 % bolus 1,000 mL (1,000 mLs Intravenous Bolus 04/16/22 0116)  metoCLOPramide (REGLAN) injection 10 mg (10 mg Intravenous Given 04/16/22 0112)  amoxicillin-clavulanate (AUGMENTIN) 875-125 MG per tablet 1 tablet (1 tablet Oral Given 04/16/22 0413)  ketorolac (TORADOL) 15 MG/ML injection 15 mg (15 mg Intravenous Given 04/16/22 0413)  dicyclomine (BENTYL) capsule 10 mg (10 mg Oral Given 04/16/22 0413)  oxyCODONE-acetaminophen (PERCOCET/ROXICET) 5-325 MG per tablet 1 tablet (1 tablet Oral Given 04/16/22 0413)    ED Course/ Medical Decision Making/ A&P                           Medical Decision Making Amount and/or Complexity of Data Reviewed Labs: ordered.  Risk Prescription drug management.   This patient presents to the ED for concern of abdominal pain, this involves an extensive number of treatment options, and is a complaint that carries with it a high risk of complications and morbidity.  The differential diagnosis includes viral illness vs constipation vs diverticulitis vs UTI vs gastritis/PUD   Co morbidities that complicate the patient evaluation  Obesity    Additional  history obtained:  Additional history obtained from husband at bedside External records from outside source obtained and reviewed including prior abdominal CT scans which show hx of recurrent, uncomplicated diverticulitis.    Lab Tests:  I Ordered, and personally interpreted labs.  The pertinent results include:  Mild creatinine elevation (0.7>1.12), otherwise normal CMP. Normal CBC, lipase, UA. Upreg negative.   Cardiac Monitoring:  The patient was maintained on a cardiac monitor.  I personally viewed and interpreted the cardiac monitored which showed an underlying rhythm of: NSR   Medicines ordered and prescription drug management:  I ordered medication including Toradol for abdominal pain and headache. Reglan for nausea.  Reevaluation of the patient after these medicines showed that the patient improved I have reviewed  the patients home medicines and have made adjustments as needed   Test Considered:  CT abdomen/pelvis   Reevaluation:  After the interventions noted above, I reevaluated the patient and found that they have :improved   Social Determinants of Health:  Uninsured No established PCP   Dispostion:  After consideration of the diagnostic results and the patients response to treatment, I feel that the patent would benefit from course of Augment for presumed diverticulitis. Hx of same on prior CTs despite negative blood work. Noted to have some focal TTP in the left mid abdomen. No peritoneal signs, afebrile. Rx for Fioricet also prescribed for any persistent headache. Will refer to GI given multiple prior ED visits for abdominal pain. Return precautions discussed and provided. Patient discharged in stable condition with no unaddressed concerns.         Final Clinical Impression(s) / ED Diagnoses Final diagnoses:  Abdominal pain, unspecified abdominal location  Bad headache    Rx / DC Orders ED Discharge Orders          Ordered     amoxicillin-clavulanate (AUGMENTIN) 875-125 MG tablet  Every 12 hours        04/16/22 0344    butalbital-acetaminophen-caffeine (FIORICET) 50-325-40 MG tablet  Every 8 hours PRN        04/16/22 0344              Antonietta Breach, PA-C 04/16/22 0424    Quintella Reichert, MD 04/16/22 [phone]

## 2022-04-16 NOTE — Discharge Instructions (Signed)
Given your history of diverticulitis, you have been started on a course of Augmentin.  Take as prescribed until finished.  You have also been given a prescription for Fioricet to use for any persistent headache.  Follow-up with a primary care doctor to ensure that symptoms resolve.  You have been provided a referral to gastroenterology given your frequent visits for abdominal pain.  We recommend that you make an appointment with a gastroenterologist for further management of your ongoing/recurrent symptoms.

## 2022-04-18 ENCOUNTER — Emergency Department (HOSPITAL_BASED_OUTPATIENT_CLINIC_OR_DEPARTMENT_OTHER)
Admission: EM | Admit: 2022-04-18 | Discharge: 2022-04-19 | Disposition: A | Discharge status: Home / Self Care | Payer: Self-pay | Attending: Emergency Medicine | Admitting: Emergency Medicine

## 2022-04-18 ENCOUNTER — Encounter (HOSPITAL_BASED_OUTPATIENT_CLINIC_OR_DEPARTMENT_OTHER): Payer: Self-pay | Admitting: Urology

## 2022-04-18 DIAGNOSIS — J45909 Unspecified asthma, uncomplicated: Secondary | ICD-10-CM | POA: Insufficient documentation

## 2022-04-18 DIAGNOSIS — R1030 Lower abdominal pain, unspecified: Secondary | ICD-10-CM | POA: Insufficient documentation

## 2022-04-18 DIAGNOSIS — R112 Nausea with vomiting, unspecified: Secondary | ICD-10-CM | POA: Insufficient documentation

## 2022-04-18 LAB — CBC WITH DIFFERENTIAL/PLATELET
Abs Immature Granulocytes: 0.03 10*3/uL (ref 0.00–0.07)
Basophils Absolute: 0 10*3/uL (ref 0.0–0.1)
Basophils Relative: 0 %
Eosinophils Absolute: 0.1 10*3/uL (ref 0.0–0.5)
Eosinophils Relative: 1 %
HCT: 36.2 % (ref 36.0–46.0)
Hemoglobin: 12.1 g/dL (ref 12.0–15.0)
Immature Granulocytes: 0 %
Lymphocytes Relative: 38 %
Lymphs Abs: 3.6 10*3/uL (ref 0.7–4.0)
MCH: 30.3 pg (ref 26.0–34.0)
MCHC: 33.4 g/dL (ref 30.0–36.0)
MCV: 90.7 fL (ref 80.0–100.0)
Monocytes Absolute: 0.7 10*3/uL (ref 0.1–1.0)
Monocytes Relative: 8 %
Neutro Abs: 5 10*3/uL (ref 1.7–7.7)
Neutrophils Relative %: 53 %
Platelets: 241 10*3/uL (ref 150–400)
RBC: 3.99 MIL/uL (ref 3.87–5.11)
RDW: 12.4 % (ref 11.5–15.5)
WBC: 9.4 10*3/uL (ref 4.0–10.5)
nRBC: 0 % (ref 0.0–0.2)

## 2022-04-18 LAB — COMPREHENSIVE METABOLIC PANEL
ALT: 34 U/L (ref 0–44)
AST: 25 U/L (ref 15–41)
Albumin: 3.8 g/dL (ref 3.5–5.0)
Alkaline Phosphatase: 99 U/L (ref 38–126)
Anion gap: 4 — ABNORMAL LOW (ref 5–15)
BUN: 20 mg/dL (ref 6–20)
CO2: 26 mmol/L (ref 22–32)
Calcium: 8.8 mg/dL — ABNORMAL LOW (ref 8.9–10.3)
Chloride: 106 mmol/L (ref 98–111)
Creatinine, Ser: 0.72 mg/dL (ref 0.44–1.00)
GFR, Estimated: >60 mL/min (ref >60)
Glucose, Bld: 104 mg/dL — ABNORMAL HIGH (ref 70–99)
Potassium: 3.5 mmol/L (ref 3.5–5.1)
Sodium: 136 mmol/L (ref 135–145)
Total Bilirubin: 0.4 mg/dL (ref 0.3–1.2)
Total Protein: 7.1 g/dL (ref 6.5–8.1)

## 2022-04-18 LAB — LIPASE, BLOOD: Lipase: 32 U/L (ref 11–51)

## 2022-04-18 MED ORDER — FENTANYL CITRATE PF 50 MCG/ML IJ SOSY
50.0000 ug | PREFILLED_SYRINGE | Freq: Once | INTRAMUSCULAR | Status: AC
  Administered 2022-04-18: 50 ug via INTRAVENOUS
  Filled 2022-04-18: qty 1

## 2022-04-18 MED ORDER — SODIUM CHLORIDE 0.9 % IV BOLUS
1000.0000 mL | Freq: Once | INTRAVENOUS | Status: AC
  Administered 2022-04-18: 1000 mL via INTRAVENOUS

## 2022-04-18 MED ORDER — ONDANSETRON HCL 4 MG/2ML IJ SOLN
4.0000 mg | Freq: Once | INTRAMUSCULAR | Status: AC
Start: 2022-04-18 — End: 2022-04-18
  Administered 2022-04-18: 4 mg via INTRAVENOUS
  Filled 2022-04-18: qty 2

## 2022-04-18 NOTE — ED Provider Notes (Signed)
[facility] DE[facility] Provider Note: Georgena Spurling, MD, FACEP  CSN: [id number] MRN: [id number] ARRIVAL: 04/18/22 at 2213 ROOM: [place]  Abdominal Pain   HISTORY OF PRESENT ILLNESS  04/18/22 10:48 PM Lorraine [name] is a 36 y.o. female with frequent visits to the emergency department for abdominal pain.  She has received 9 CTs of the abdomen and pelvis in the last calendar year.  She is here with lower abdominal pain that began on the left-hand side 3 days ago but has migrated to the right side..  She has had nausea and vomiting with this but no diarrhea.  She has not had a fever.  She rates her pain as a 7 out of 10, worse with palpation.  She was given IV fentanyl, Zofran and normal saline bolus per protocol prior to my evaluation.   Past Medical History:  Diagnosis Date   Angio-edema    Asthma    Inhaler used 01/02/16   Diverticulitis    Nausea & vomiting 04/02/2017   Pancreatitis    Pre-diabetes    Pyelonephritis    Urticaria     Past Surgical History:  Procedure Laterality Date   CESAREAN SECTION     CESAREAN SECTION N/A 2006   CESAREAN SECTION N/A 09/03/2014   Procedure: CESAREAN SECTION;  Surgeon: Guss Bunde, MD;  Location: [facility];  Service: Obstetrics;  Laterality: N/A;   CHOLECYSTECTOMY     ESOPHAGOGASTRODUODENOSCOPY (EGD) WITH PROPOFOL Left 04/07/2017   Procedure: ESOPHAGOGASTRODUODENOSCOPY (EGD) WITH PROPOFOL;  Surgeon: Ronnette Juniper, MD;  Location: [facility];  Service: Gastroenterology;  Laterality: Left;   TUBAL LIGATION      Family History  Problem Relation Age of Onset   Hyperlipidemia Mother    Diabetes Father    Ulcers Father    Diabetes Maternal Uncle    Diabetes Paternal Grandmother    Asthma Daughter    Allergic rhinitis Neg Hx    Angioedema Neg Hx    Eczema Neg Hx    Immunodeficiency Neg Hx    Urticaria Neg Hx     Social History   Tobacco Use   Smoking status: Never   Smokeless tobacco: Never  Vaping Use    Vaping Use: Never used  Substance Use Topics   Alcohol use: No   Drug use: No    Prior to Admission medications   Medication Sig Start Date End Date Taking? Authorizing Provider  butalbital-acetaminophen-caffeine (FIORICET) 50-325-40 MG tablet Take 1-2 tablets by mouth every 8 (eight) hours as needed for headache. 04/16/22 04/16/23  Antonietta Breach, PA-C  potassium chloride SA (KLOR-CON) 20 MEQ tablet Take 1 tablet (20 mEq total) by mouth daily for 3 days. 06/14/21 06/17/21  Jeanell Sparrow, DO  cetirizine (ZYRTEC ALLERGY) 10 MG tablet Take 1 tablet (10 mg total) by mouth 2 (two) times daily. Patient not taking: Reported on 12/06/2020 11/12/20 01/24/21  Muthersbaugh, Jarrett Soho, PA-C    Allergies Iodinated contrast media, Lupine bean extract, Ceftriaxone, and Morphine   REVIEW OF SYSTEMS  Negative except as noted here or in the History of Present Illness.   PHYSICAL EXAMINATION  Initial Vital Signs Blood pressure 125/67, pulse 71, temperature 98.4 F (36.9 C), temperature source Oral, resp. rate 18, height 5' (1.524 m), weight 79.8 kg, SpO2 98 %.  Examination General: Well-developed, well-nourished female in no acute distress; appearance consistent with age of record HENT: normocephalic; atraumatic Eyes: Normal appearance Neck: supple Heart: regular rate and rhythm Lungs: clear to auscultation bilaterally  Abdomen: soft; nondistended; mild left lower quadrant tenderness, right lower and upper quadrant tenderness; bowel sounds present Extremities: No deformity; full range of motion; pulses normal Neurologic: Awake, alert and oriented; motor function intact in all extremities and symmetric; no facial droop Skin: Warm and dry Psychiatric: Normal mood and affect   RESULTS  Summary of this visit's results, reviewed and interpreted by myself:   EKG Interpretation  Date/Time:    Ventricular Rate:    PR Interval:    QRS Duration:   QT Interval:    QTC Calculation:   R Axis:     Text  Interpretation:         Laboratory Studies: Results for orders placed or performed during the hospital encounter of 04/18/22 (from the past 24 hour(s))  CBC with Differential     Status: None   Collection Time: 04/18/22 10:53 PM  Result Value Ref Range   WBC 9.4 4.0 - 10.5 K/uL   RBC 3.99 3.87 - 5.11 MIL/uL   Hemoglobin 12.1 12.0 - 15.0 g/dL   HCT 36.2 36.0 - 46.0 %   MCV 90.7 80.0 - 100.0 fL   MCH 30.3 26.0 - 34.0 pg   MCHC 33.4 30.0 - 36.0 g/dL   RDW 12.4 11.5 - 15.5 %   Platelets 241 150 - 400 K/uL   nRBC 0.0 0.0 - 0.2 %   Neutrophils Relative % 53 %   Neutro Abs 5.0 1.7 - 7.7 K/uL   Lymphocytes Relative 38 %   Lymphs Abs 3.6 0.7 - 4.0 K/uL   Monocytes Relative 8 %   Monocytes Absolute 0.7 0.1 - 1.0 K/uL   Eosinophils Relative 1 %   Eosinophils Absolute 0.1 0.0 - 0.5 K/uL   Basophils Relative 0 %   Basophils Absolute 0.0 0.0 - 0.1 K/uL   Immature Granulocytes 0 %   Abs Immature Granulocytes 0.03 0.00 - 0.07 K/uL  Comprehensive metabolic panel     Status: Abnormal   Collection Time: 04/18/22 10:53 PM  Result Value Ref Range   Sodium 136 135 - 145 mmol/L   Potassium 3.5 3.5 - 5.1 mmol/L   Chloride 106 98 - 111 mmol/L   CO2 26 22 - 32 mmol/L   Glucose, Bld 104 (H) 70 - 99 mg/dL   BUN 20 6 - 20 mg/dL   Creatinine, Ser 0.72 0.44 - 1.00 mg/dL   Calcium 8.8 (L) 8.9 - 10.3 mg/dL   Total Protein 7.1 6.5 - 8.1 g/dL   Albumin 3.8 3.5 - 5.0 g/dL   AST 25 15 - 41 U/L   ALT 34 0 - 44 U/L   Alkaline Phosphatase 99 38 - 126 U/L   Total Bilirubin 0.4 0.3 - 1.2 mg/dL   GFR, Estimated >60 >60 mL/min   Anion gap 4 (L) 5 - 15  Lipase, blood     Status: None   Collection Time: 04/18/22 10:53 PM  Result Value Ref Range   Lipase 32 11 - 51 U/L  Urinalysis, Routine w reflex microscopic Urine, Clean Catch     Status: None   Collection Time: 04/18/22 10:53 PM  Result Value Ref Range   Color, Urine YELLOW YELLOW   APPearance CLEAR CLEAR   Specific Gravity, Urine 1.015 1.005 - 1.030    pH 6.5 5.0 - 8.0   Glucose, UA NEGATIVE NEGATIVE mg/dL   Hgb urine dipstick NEGATIVE NEGATIVE   Bilirubin Urine NEGATIVE NEGATIVE   Ketones, ur NEGATIVE NEGATIVE mg/dL   Protein, ur NEGATIVE  NEGATIVE mg/dL   Nitrite NEGATIVE NEGATIVE   Leukocytes,Ua NEGATIVE NEGATIVE  Pregnancy, urine     Status: None   Collection Time: 04/18/22 10:53 PM  Result Value Ref Range   Preg Test, Ur NEGATIVE NEGATIVE   Imaging Studies: No results found.  ED COURSE and MDM  Nursing notes, initial and subsequent vitals signs, including pulse oximetry, reviewed and interpreted by myself.  Vitals:   04/18/22 2220 04/18/22 2223 04/19/22 0131  BP:  125/67 121/66  Pulse:  71 72  Resp:  18 17  Temp:  98.4 F (36.9 C) 98.4 F (36.9 C)  TempSrc:  Oral Oral  SpO2:  98% 99%  Weight: 79.8 kg    Height: 5' (1.524 m)     Medications  fentaNYL (SUBLIMAZE) injection 50 mcg (has no administration in time range)  sodium chloride 0.9 % bolus 1,000 mL ( Intravenous Stopped 04/18/22 2356)  ondansetron (ZOFRAN) injection 4 mg (4 mg Intravenous Given 04/18/22 2251)  fentaNYL (SUBLIMAZE) injection 50 mcg (50 mcg Intravenous Given 04/18/22 2252)   Patient's laboratory studies are normal.  She has no leukocytosis and no fever.  She has a history of recurrent abdominal pain and has received 9 CTs of the abdomen and pelvis in the past calendar year.  I do not believe an additional CT scan would be in her interest as her risk of cancer is already significantly elevated due to the large number of CT scans she has received over the years (likely in the dozens).  Since her pain is located low in the abdomen we will have her return for pelvic ultrasound later this morning.  She does have a history of diverticulitis but if this is diverticulitis I suspect it is early and/or mild and current recommendations discourage antibiotics for uncomplicated diverticulitis.   PROCEDURES  Procedures   ED DIAGNOSES     ICD-10-CM   1. Lower  abdominal pain  R10.30          Addiel Mccardle, MD 04/19/22 1601

## 2022-04-18 NOTE — ED Triage Notes (Signed)
Pt states RLQ pain today with abdominal pain x 2 day , reports N/V as well  Denies fever  Seen on 7/30  for same

## 2022-04-19 ENCOUNTER — Ambulatory Visit (HOSPITAL_BASED_OUTPATIENT_CLINIC_OR_DEPARTMENT_OTHER)
Admission: RE | Admit: 2022-04-19 | Discharge: 2022-04-19 | Disposition: A | Discharge status: Home / Self Care | Payer: Self-pay | Source: Ambulatory Visit | Attending: Emergency Medicine | Admitting: Emergency Medicine

## 2022-04-19 DIAGNOSIS — R102 Pelvic and perineal pain: Secondary | ICD-10-CM | POA: Insufficient documentation

## 2022-04-19 LAB — URINALYSIS, ROUTINE W REFLEX MICROSCOPIC
Bilirubin Urine: NEGATIVE
Glucose, UA: NEGATIVE mg/dL
Hgb urine dipstick: NEGATIVE
Ketones, ur: NEGATIVE mg/dL
Leukocytes,Ua: NEGATIVE
Nitrite: NEGATIVE
Protein, ur: NEGATIVE mg/dL
Specific Gravity, Urine: 1.015 (ref 1.005–1.030)
pH: 6.5 (ref 5.0–8.0)

## 2022-04-19 LAB — PREGNANCY, URINE: Preg Test, Ur: NEGATIVE

## 2022-04-19 MED ORDER — FENTANYL CITRATE PF 50 MCG/ML IJ SOSY
50.0000 ug | PREFILLED_SYRINGE | Freq: Once | INTRAMUSCULAR | Status: AC
  Administered 2022-04-19: 50 ug via INTRAVENOUS
  Filled 2022-04-19: qty 1

## 2022-04-19 NOTE — ED Provider Notes (Signed)
Blood pressure 121/66, pulse 72, temperature 98.4 F (36.9 C), temperature source Oral, resp. rate 17, height 5' (1.524 m), weight 79.8 kg, SpO2 99 %.  In short, Lorraine [name] is a 36 y.o. female with a chief complaint of Abdominal Pain .  Refer to the original H&P for additional details.  02:20 PM Patient returns for pelvic ultrasound today after being seen last night by Dr. Florina Ou.  See original H&P for additional details.  Ultrasound shows no evidence of torsion or other acute abnormality.  This was discussed with patient who will continue to follow as an outpatient with her PCP for further work-up.    Margette Fast, MD 04/19/22 1420

## 2022-04-28 ENCOUNTER — Encounter (HOSPITAL_COMMUNITY): Payer: Self-pay

## 2022-04-28 ENCOUNTER — Other Ambulatory Visit: Payer: Self-pay

## 2022-04-28 ENCOUNTER — Emergency Department (HOSPITAL_COMMUNITY)
Admission: EM | Admit: 2022-04-28 | Discharge: 2022-04-29 | Disposition: A | Discharge status: Home / Self Care | Payer: Self-pay | Attending: Emergency Medicine | Admitting: Emergency Medicine

## 2022-04-28 DIAGNOSIS — J45909 Unspecified asthma, uncomplicated: Secondary | ICD-10-CM | POA: Insufficient documentation

## 2022-04-28 DIAGNOSIS — R1031 Right lower quadrant pain: Secondary | ICD-10-CM | POA: Insufficient documentation

## 2022-04-28 NOTE — ED Triage Notes (Signed)
Mid right sided abdominal pain x 2 weeks.

## 2022-04-29 ENCOUNTER — Emergency Department (HOSPITAL_COMMUNITY): Payer: Self-pay

## 2022-04-29 LAB — LIPASE, BLOOD: Lipase: 31 U/L (ref 11–51)

## 2022-04-29 LAB — CBC
HCT: 36.9 % (ref 36.0–46.0)
Hemoglobin: 12.4 g/dL (ref 12.0–15.0)
MCH: 30.8 pg (ref 26.0–34.0)
MCHC: 33.6 g/dL (ref 30.0–36.0)
MCV: 91.6 fL (ref 80.0–100.0)
Platelets: 257 10*3/uL (ref 150–400)
RBC: 4.03 MIL/uL (ref 3.87–5.11)
RDW: 12.8 % (ref 11.5–15.5)
WBC: 9.4 10*3/uL (ref 4.0–10.5)
nRBC: 0 % (ref 0.0–0.2)

## 2022-04-29 LAB — HCG, QUANTITATIVE, PREGNANCY: hCG, Beta Chain, Quant, S: <1 m[IU]/mL (ref <5)

## 2022-04-29 LAB — COMPREHENSIVE METABOLIC PANEL
ALT: 17 U/L (ref 0–44)
AST: 18 U/L (ref 15–41)
Albumin: 4.1 g/dL (ref 3.5–5.0)
Alkaline Phosphatase: 87 U/L (ref 38–126)
Anion gap: 9 (ref 5–15)
BUN: 24 mg/dL — ABNORMAL HIGH (ref 6–20)
CO2: 22 mmol/L (ref 22–32)
Calcium: 9.3 mg/dL (ref 8.9–10.3)
Chloride: 111 mmol/L (ref 98–111)
Creatinine, Ser: 0.86 mg/dL (ref 0.44–1.00)
GFR, Estimated: >60 mL/min (ref >60)
Glucose, Bld: 99 mg/dL (ref 70–99)
Potassium: 3.2 mmol/L — ABNORMAL LOW (ref 3.5–5.1)
Sodium: 142 mmol/L (ref 135–145)
Total Bilirubin: 0.4 mg/dL (ref 0.3–1.2)
Total Protein: 7.3 g/dL (ref 6.5–8.1)

## 2022-04-29 LAB — URINALYSIS, ROUTINE W REFLEX MICROSCOPIC
Bilirubin Urine: NEGATIVE
Glucose, UA: NEGATIVE mg/dL
Hgb urine dipstick: NEGATIVE
Ketones, ur: NEGATIVE mg/dL
Leukocytes,Ua: NEGATIVE
Nitrite: NEGATIVE
Protein, ur: NEGATIVE mg/dL
Specific Gravity, Urine: 1.02 (ref 1.005–1.030)
pH: 6 (ref 5.0–8.0)

## 2022-04-29 MED ORDER — KETOROLAC TROMETHAMINE 15 MG/ML IJ SOLN
15.0000 mg | Freq: Once | INTRAMUSCULAR | Status: AC
  Administered 2022-04-29: 15 mg via INTRAVENOUS
  Filled 2022-04-29: qty 1

## 2022-04-29 MED ORDER — HYDROCODONE-ACETAMINOPHEN 5-325 MG PO TABS
1.0000 | ORAL_TABLET | Freq: Once | ORAL | Status: AC
  Administered 2022-04-29: 1 via ORAL
  Filled 2022-04-29: qty 1

## 2022-04-29 MED ORDER — SODIUM CHLORIDE 0.9 % IV BOLUS
500.0000 mL | Freq: Once | INTRAVENOUS | Status: AC
  Administered 2022-04-29: 500 mL via INTRAVENOUS

## 2022-04-29 MED ORDER — FENTANYL CITRATE PF 50 MCG/ML IJ SOSY
50.0000 ug | PREFILLED_SYRINGE | Freq: Once | INTRAMUSCULAR | Status: AC
  Administered 2022-04-29: 50 ug via INTRAVENOUS
  Filled 2022-04-29: qty 1

## 2022-04-29 NOTE — ED Provider Notes (Signed)
[facility] DEPT Provider Note   CSN: [id number] Arrival date & time: 04/28/22  2153     History  Chief Complaint  Patient presents with   Abdominal Pain    Lorraine [name] is a 36 y.o. female.  The history is provided by the patient, medical records and a significant other. A language interpreter was used.  Abdominal Pain Lorraine [name] is a 36 y.o. female who presents to the Emergency Department complaining of abdominal pain.  She presents to the ED for evaluation of right sided abdominal pain.  Pain radiates to the right flank. Has BLE edema.  Pain started today and is described as something is pulsating or like something might burst.  Pain is constant.  Pain is worse with walking.   No fever.  Has chills. No vomiting.  Has nausea.  No dysuria.    Hx/o asthma, c/s, cholecystectomy.     Home Medications Prior to Admission medications   Medication Sig Start Date End Date Taking? Authorizing Provider  ibuprofen (ADVIL) 800 MG tablet Take 800 mg by mouth 3 (three) times daily. 04/21/22  Yes [provider]  butalbital-acetaminophen-caffeine (FIORICET) 50-325-40 MG tablet Take 1-2 tablets by mouth every 8 (eight) hours as needed for headache. Patient not taking: Reported on 04/29/2022 04/16/22 04/16/23  Antonietta Breach, PA-C  cyclobenzaprine (FLEXERIL) 10 MG tablet Take 10 mg by mouth 2 (two) times daily as needed. Patient not taking: Reported on 04/29/2022 04/21/22   [provider]  potassium chloride SA (KLOR-CON) 20 MEQ tablet Take 1 tablet (20 mEq total) by mouth daily for 3 days. 06/14/21 06/17/21  Jeanell Sparrow, DO  cetirizine (ZYRTEC ALLERGY) 10 MG tablet Take 1 tablet (10 mg total) by mouth 2 (two) times daily. Patient not taking: Reported on 12/06/2020 11/12/20 01/24/21  Muthersbaugh, Jarrett Soho, PA-C      Allergies    Iodinated contrast media, Lupine bean extract, Ceftriaxone, and Morphine    Review of Systems   Review of Systems   Gastrointestinal:  Positive for abdominal pain.  All other systems reviewed and are negative.   Physical Exam Updated Vital Signs BP 118/75   Pulse 77   Temp 97.8 F (36.6 C) (Oral)   Resp 18   Ht 5' (1.524 m)   Wt 79.4 kg   SpO2 99%   BMI 34.18 kg/m  Physical Exam Vitals and nursing note reviewed.  Constitutional:      Appearance: She is well-developed.  HENT:     Head: Normocephalic and atraumatic.  Cardiovascular:     Rate and Rhythm: Normal rate and regular rhythm.     Heart sounds: No murmur heard. Pulmonary:     Effort: Pulmonary effort is normal. No respiratory distress.     Breath sounds: Normal breath sounds.  Abdominal:     Palpations: Abdomen is soft.     Tenderness: There is no guarding or rebound.     Comments: Moderate right mid abdominal tenderness  Musculoskeletal:        General: No swelling or tenderness.  Skin:    General: Skin is warm and dry.  Neurological:     Mental Status: She is alert and oriented to person, place, and time.  Psychiatric:        Behavior: Behavior normal.     ED Results / Procedures / Treatments   Labs (all labs ordered are listed, but only abnormal results are displayed) Labs Reviewed  COMPREHENSIVE METABOLIC PANEL - Abnormal; Notable for the following  components:      Result Value   Potassium 3.2 (*)    BUN 24 (*)    All other components within normal limits  LIPASE, BLOOD  CBC  URINALYSIS, ROUTINE W REFLEX MICROSCOPIC  HCG, QUANTITATIVE, PREGNANCY    EKG None  Radiology CT Abdomen Pelvis Wo Contrast  Result Date: 04/29/2022 CLINICAL DATA:  Right lower quadrant pain for 2 weeks EXAM: CT ABDOMEN AND PELVIS WITHOUT CONTRAST TECHNIQUE: Multidetector CT imaging of the abdomen and pelvis was performed following the standard protocol without IV contrast. RADIATION DOSE REDUCTION: This exam was performed according to the departmental dose-optimization program which includes automated exposure control, adjustment of  the mA and/or kV according to patient size and/or use of iterative reconstruction technique. COMPARISON:  02/15/2022, ultrasound from 04/19/2022 FINDINGS: Lower chest: No acute abnormality. Hepatobiliary: No focal liver abnormality is seen. Status post cholecystectomy. No biliary dilatation. Pancreas: Unremarkable. No pancreatic ductal dilatation or surrounding inflammatory changes. Spleen: Normal in size without focal abnormality. Adrenals/Urinary Tract: Adrenal glands are within normal limits. Kidneys demonstrate no renal calculi or urinary tract obstructive changes. Bladder is well distended. Stomach/Bowel: No obstructive or inflammatory changes of the colon are seen. Mild diverticular change is noted within the sigmoid. The appendix is within normal limits. Small bowel and stomach are unremarkable. Vascular/Lymphatic: No significant vascular findings are present. No enlarged abdominal or pelvic lymph nodes. Reproductive: Uterus and bilateral adnexa are unremarkable. Changes of tubal ligation are noted. Other: No abdominal wall hernia or abnormality. No abdominopelvic ascites. Musculoskeletal: No acute or significant osseous findings. IMPRESSION: No acute abnormality is noted to correspond with the given clinical history. Specifically the appendix is normal limits. Electronically Signed   By: Inez Catalina M.D.   On: 04/29/2022 02:44    Procedures Procedures    Medications Ordered in ED Medications  HYDROcodone-acetaminophen (NORCO/VICODIN) 5-325 MG per tablet 1 tablet (has no administration in time range)  fentaNYL (SUBLIMAZE) injection 50 mcg (50 mcg Intravenous Given 04/29/22 0137)  sodium chloride 0.9 % bolus 500 mL (0 mLs Intravenous Stopped 04/29/22 0243)  fentaNYL (SUBLIMAZE) injection 50 mcg (50 mcg Intravenous Given 04/29/22 0243)  ketorolac (TORADOL) 15 MG/ML injection 15 mg (15 mg Intravenous Given 04/29/22 0446)    ED Course/ Medical Decision Making/ A&P                           Medical  Decision Making Amount and/or Complexity of Data Reviewed Labs: ordered. Radiology: ordered.  Risk Prescription drug management.   Patient here for evaluation of right-sided abdominal pain, chills.  She has tenderness on examination without peritoneal findings.  Labs are overall reassuring.  Given her tenderness a CT abdomen pelvis was obtained.  She does have a contrast allergy so this was a noncontrasted study.  CT is negative for acute abnormality, in particular no evidence of appendicitis.  She has partial improvement in her symptoms during her ED stay.  Discussed with patient unclear source of symptoms.  Discussed OTC analgesics for pain control.  Discussed outpatient follow-up.        Final Clinical Impression(s) / ED Diagnoses Final diagnoses:  Right lower quadrant abdominal pain    Rx / DC Orders ED Discharge Orders     None         Quintella Reichert, MD 04/29/22 [phone]

## 2022-05-01 ENCOUNTER — Emergency Department (HOSPITAL_BASED_OUTPATIENT_CLINIC_OR_DEPARTMENT_OTHER)
Admission: EM | Admit: 2022-05-01 | Discharge: 2022-05-01 | Disposition: A | Discharge status: Home / Self Care | Payer: Self-pay | Attending: Emergency Medicine | Admitting: Emergency Medicine

## 2022-05-01 ENCOUNTER — Other Ambulatory Visit: Payer: Self-pay

## 2022-05-01 ENCOUNTER — Encounter (HOSPITAL_BASED_OUTPATIENT_CLINIC_OR_DEPARTMENT_OTHER): Payer: Self-pay | Admitting: Urology

## 2022-05-01 DIAGNOSIS — R112 Nausea with vomiting, unspecified: Secondary | ICD-10-CM | POA: Insufficient documentation

## 2022-05-01 DIAGNOSIS — R1031 Right lower quadrant pain: Secondary | ICD-10-CM | POA: Insufficient documentation

## 2022-05-01 DIAGNOSIS — R1030 Lower abdominal pain, unspecified: Secondary | ICD-10-CM

## 2022-05-01 LAB — CBC
HCT: 37.4 % (ref 36.0–46.0)
Hemoglobin: 12.8 g/dL (ref 12.0–15.0)
MCH: 30.5 pg (ref 26.0–34.0)
MCHC: 34.2 g/dL (ref 30.0–36.0)
MCV: 89 fL (ref 80.0–100.0)
Platelets: 296 10*3/uL (ref 150–400)
RBC: 4.2 MIL/uL (ref 3.87–5.11)
RDW: 12.7 % (ref 11.5–15.5)
WBC: 8.2 10*3/uL (ref 4.0–10.5)
nRBC: 0 % (ref 0.0–0.2)

## 2022-05-01 LAB — URINALYSIS, MICROSCOPIC (REFLEX)

## 2022-05-01 LAB — COMPREHENSIVE METABOLIC PANEL
ALT: 20 U/L (ref 0–44)
AST: 27 U/L (ref 15–41)
Albumin: 4.2 g/dL (ref 3.5–5.0)
Alkaline Phosphatase: 91 U/L (ref 38–126)
Anion gap: 9 (ref 5–15)
BUN: 21 mg/dL — ABNORMAL HIGH (ref 6–20)
CO2: 23 mmol/L (ref 22–32)
Calcium: 9.4 mg/dL (ref 8.9–10.3)
Chloride: 108 mmol/L (ref 98–111)
Creatinine, Ser: 0.96 mg/dL (ref 0.44–1.00)
GFR, Estimated: >60 mL/min (ref >60)
Glucose, Bld: 100 mg/dL — ABNORMAL HIGH (ref 70–99)
Potassium: 3.4 mmol/L — ABNORMAL LOW (ref 3.5–5.1)
Sodium: 140 mmol/L (ref 135–145)
Total Bilirubin: 0.4 mg/dL (ref 0.3–1.2)
Total Protein: 7.8 g/dL (ref 6.5–8.1)

## 2022-05-01 LAB — URINALYSIS, ROUTINE W REFLEX MICROSCOPIC
Bilirubin Urine: NEGATIVE
Glucose, UA: NEGATIVE mg/dL
Hgb urine dipstick: NEGATIVE
Ketones, ur: NEGATIVE mg/dL
Leukocytes,Ua: NEGATIVE
Nitrite: NEGATIVE
Protein, ur: 30 mg/dL — AB
Specific Gravity, Urine: >=1.03 (ref 1.005–1.030)
pH: 5.5 (ref 5.0–8.0)

## 2022-05-01 LAB — PREGNANCY, URINE: Preg Test, Ur: NEGATIVE

## 2022-05-01 LAB — LIPASE, BLOOD: Lipase: 31 U/L (ref 11–51)

## 2022-05-01 MED ORDER — KETOROLAC TROMETHAMINE 15 MG/ML IJ SOLN
15.0000 mg | Freq: Once | INTRAMUSCULAR | Status: AC
  Administered 2022-05-01: 15 mg via INTRAVENOUS
  Filled 2022-05-01: qty 1

## 2022-05-01 MED ORDER — DICYCLOMINE HCL 20 MG PO TABS: 20.0000 mg | ORAL_TABLET | Freq: Two times a day (BID) | ORAL | 0 refills | Status: DC | PRN

## 2022-05-01 MED ORDER — ONDANSETRON HCL 4 MG PO TABS: 4.0000 mg | ORAL_TABLET | Freq: Four times a day (QID) | ORAL | 0 refills | Status: DC | PRN

## 2022-05-01 MED ORDER — SODIUM CHLORIDE 0.9 % IV BOLUS
1000.0000 mL | Freq: Once | INTRAVENOUS | Status: AC
  Administered 2022-05-01: 1000 mL via INTRAVENOUS

## 2022-05-01 MED ORDER — ONDANSETRON HCL 4 MG/2ML IJ SOLN
4.0000 mg | Freq: Once | INTRAMUSCULAR | Status: AC
  Administered 2022-05-01: 4 mg via INTRAVENOUS
  Filled 2022-05-01: qty 2

## 2022-05-01 NOTE — Discharge Instructions (Addendum)
Diagnosed with non specific abdominal pain. ED evaluation today was reassuring. Vital were normal. Abdominal pain could be related to and MSK injury or gut spasm. Nevertheless, given recent history of prolonged abdominal pain, recommend follow up with gastroenterology. If abdominal pain worsens, new chest pain or shortness of breath, and/or fever please return to the ED for further evaluation. I ordered zofran for nausea as needed. And bentyl for bowel spasm as needed.

## 2022-05-01 NOTE — ED Triage Notes (Signed)
RUQ pain that started today  States N/V Denies fever   H/o Cholecystectomy

## 2022-05-01 NOTE — ED Provider Notes (Signed)
[facility] Emergency Department Provider Note MRN:  [id number]  Arrival date & time: 05/01/22     Chief Complaint   Abdominal Pain   History of Present Illness   Lorraine [name] is a 36 y.o. year-old female presents to the ED with chief complaint of abdominal pain. Pain located in RLQ. Radiates to the left side at times. It is a "pulsating" pain. Started at 12 noon today and has gotten progressively worsened throughout the day. Endorses painful/burning urination. Endorses nausea and emesis x2 today.  Denies, hematuria, hematachezia, melena, or hemoptysis. Denies CP and SOB. Denies fevers and diarrhea. Was seen in the ED two ago for right sided abdominal pain. Work up was unremarkable. Patient also mentioned that she does lift heavy objects all day long at work and believe that could be contributing to her pain.      Review of Systems  Pertinent positive and negative review of systems noted in HPI.    Physical Exam   Vitals:   05/01/22 1900 05/01/22 1905  BP: 111/82   Pulse: 78   Resp: 18   Temp:  98.3 F (36.8 C)  SpO2: 98%     CONSTITUTIONAL:  well-appearing, NAD NEURO:  Alert and oriented x 3, CN 3-12 grossly intact EYES:  eyes equal and reactive ENT/NECK:  Supple, no stridor  CARDIO:  regular rate and rhythm, appears well-perfused  PULM:  No respiratory distress, CTAB GI/GU:  non-distended, soft, RLQ TTP, no guarding or rebound tenderness MSK/SPINE:  No gross deformities, no edema, moves all extremities  SKIN:  no rash, atraumatic   *Additional and/or pertinent findings included in MDM below  Diagnostic and Interventional Summary    EKG Interpretation  Date/Time:    Ventricular Rate:    PR Interval:    QRS Duration:   QT Interval:    QTC Calculation:   R Axis:     Text Interpretation:         Labs Reviewed  COMPREHENSIVE METABOLIC PANEL - Abnormal; Notable for the following components:      Result Value   Potassium 3.4  (*)    Glucose, Bld 100 (*)    BUN 21 (*)    All other components within normal limits  URINALYSIS, ROUTINE W REFLEX MICROSCOPIC - Abnormal; Notable for the following components:   APPearance HAZY (*)    Protein, ur 30 (*)    All other components within normal limits  URINALYSIS, MICROSCOPIC (REFLEX) - Abnormal; Notable for the following components:   Bacteria, UA FEW (*)    All other components within normal limits  LIPASE, BLOOD  CBC  PREGNANCY, URINE    No orders to display    Medications  sodium chloride 0.9 % bolus 1,000 mL (0 mLs Intravenous Stopped 05/01/22 1752)  ondansetron (ZOFRAN) injection 4 mg (4 mg Intravenous Given 05/01/22 1651)  ketorolac (TORADOL) 15 MG/ML injection 15 mg (15 mg Intravenous Given 05/01/22 1841)     Procedures  /  Critical Care Procedures  ED Course and Medical Decision Making  I have reviewed the triage vital signs, the nursing notes, and pertinent available records from the EMR.  Social Determinants Affecting Complexity of Care: Patient .   This patient presents to the ED for concern of abdominal pain, this involves a number of treatment options, and is a complaint that carries with it a high risk of complications and morbidity.  The differential diagnosis includes appendicitis, ovarian torsion, and ACS. Seen in the ED two  days ago, CT abdomen/pelvis was negative. Considered ACS but unlikely given no CP or SOB and normal vitals.   Co morbidities: Discussed in HPI    EMR reviewed including pt PMHx, past surgical history and past visits to ER.   See HPI for more details   Lab Tests:   I ordered and independently interpreted labs. Labs notable for mild hypokalemia   Imaging Studies:  Reviewed CT ab/pelvis from two days ago which was     Cardiac Monitoring:  The patient was maintained on a cardiac monitor.  I personally viewed and interpreted the cardiac monitored which showed an underlying rhythm of: nsr NA   Medicines  ordered:  I ordered medication including zofran for nausea  and 1L NS bolus for volume resuscitation  Reevaluation of the patient after these medicines showed that the patient improved I have reviewed the patients home medicines and have made adjustments as needed      Consults/Attending Physician    NA   Reevaluation:  After the interventions noted above I re-evaluated patient and found that they have :improved   Social Determinants of Health:   Social determinants not a factor in care today    Problem List / ED Course:  Patient presented with abdominal pain. Patient has been evaluated multiple times with past few months with at least 13 CT/US scans which have been unremarkable. Her vitals were stable here with no fever, or leukocytosis. Her most recent evaluation for abdominal pain was two days ago and CT ab/pelvis was unremarkable. For this reason I felt that additional imaging was not warranted. Treated nausea with zofran and NS bolus for volume resuscitation. On reeval, patient symptoms were improved. Discharged with PRN zofran and bentyl for bowel spasm and referred her to GI for ongoing abdominal pain with unclear etiology.    Dispostion:  After consideration of the diagnostic results and the patients response to treatment, I feel that the patent would benefit from discharge and outpatient f/u with GI.     Consultants: No consultations were needed in caring for this patient.   Treatment and Plan: Emergency department workup does not suggest an emergent condition requiring admission or immediate intervention beyond  what has been performed at this time. The patient is safe for discharge and has  been instructed to return immediately for worsening symptoms, change in  symptoms or any other concerns    Final Clinical Impressions(s) / ED Diagnoses     ICD-10-CM   1. Lower abdominal pain  R10.30       ED Discharge Orders          Ordered    ondansetron  (ZOFRAN) 4 MG tablet  Every 6 hours PRN        05/01/22 1938    dicyclomine (BENTYL) 20 MG tablet  2 times daily between meals and at bedtime PRN        05/01/22 1938    Ambulatory referral to Gastroenterology        05/01/22 1939              Discharge Instructions Discussed with and Provided to Patient:     Discharge Instructions      Diagnosed with non specific abdominal pain. ED evaluation today was reassuring. Vital were normal. Abdominal pain could be related to and MSK injury or gut spasm. Nevertheless, given recent history of prolonged abdominal pain, recommend follow up with gastroenterology. If abdominal pain worsens, new chest pain or shortness of breath, and/or fever  please return to the ED for further evaluation. I ordered zofran for nausea as needed. And bentyl for bowel spasm as needed.         Harriet Pho, PA-C 05/01/22 1956    Hayden Rasmussen, MD 05/02/22 1125

## 2022-05-04 ENCOUNTER — Encounter (HOSPITAL_COMMUNITY): Payer: Self-pay | Admitting: Emergency Medicine

## 2022-05-04 ENCOUNTER — Other Ambulatory Visit: Payer: Self-pay

## 2022-05-04 ENCOUNTER — Emergency Department (HOSPITAL_COMMUNITY)
Admission: EM | Admit: 2022-05-04 | Discharge: 2022-05-05 | Discharge status: Against Medical Advice | Payer: Self-pay | Attending: Emergency Medicine | Admitting: Emergency Medicine

## 2022-05-04 DIAGNOSIS — K5900 Constipation, unspecified: Secondary | ICD-10-CM | POA: Insufficient documentation

## 2022-05-04 DIAGNOSIS — R42 Dizziness and giddiness: Secondary | ICD-10-CM | POA: Insufficient documentation

## 2022-05-04 DIAGNOSIS — R112 Nausea with vomiting, unspecified: Secondary | ICD-10-CM | POA: Insufficient documentation

## 2022-05-04 DIAGNOSIS — Z5321 Procedure and treatment not carried out due to patient leaving prior to being seen by health care provider: Secondary | ICD-10-CM | POA: Insufficient documentation

## 2022-05-04 DIAGNOSIS — R300 Dysuria: Secondary | ICD-10-CM | POA: Insufficient documentation

## 2022-05-04 LAB — COMPREHENSIVE METABOLIC PANEL
ALT: 27 U/L (ref 0–44)
AST: 28 U/L (ref 15–41)
Albumin: 4.1 g/dL (ref 3.5–5.0)
Alkaline Phosphatase: 88 U/L (ref 38–126)
Anion gap: 7 (ref 5–15)
BUN: 17 mg/dL (ref 6–20)
CO2: 27 mmol/L (ref 22–32)
Calcium: 9.1 mg/dL (ref 8.9–10.3)
Chloride: 105 mmol/L (ref 98–111)
Creatinine, Ser: 0.89 mg/dL (ref 0.44–1.00)
GFR, Estimated: >60 mL/min (ref >60)
Glucose, Bld: 100 mg/dL — ABNORMAL HIGH (ref 70–99)
Potassium: 3.5 mmol/L (ref 3.5–5.1)
Sodium: 139 mmol/L (ref 135–145)
Total Bilirubin: 0.6 mg/dL (ref 0.3–1.2)
Total Protein: 7.3 g/dL (ref 6.5–8.1)

## 2022-05-04 LAB — CBC WITH DIFFERENTIAL/PLATELET
Abs Immature Granulocytes: 0.12 10*3/uL — ABNORMAL HIGH (ref 0.00–0.07)
Basophils Absolute: 0.1 10*3/uL (ref 0.0–0.1)
Basophils Relative: 0 %
Eosinophils Absolute: 0.1 10*3/uL (ref 0.0–0.5)
Eosinophils Relative: 0 %
HCT: 37.7 % (ref 36.0–46.0)
Hemoglobin: 12.6 g/dL (ref 12.0–15.0)
Immature Granulocytes: 1 %
Lymphocytes Relative: 26 %
Lymphs Abs: 2.9 10*3/uL (ref 0.7–4.0)
MCH: 30.5 pg (ref 26.0–34.0)
MCHC: 33.4 g/dL (ref 30.0–36.0)
MCV: 91.3 fL (ref 80.0–100.0)
Monocytes Absolute: 0.5 10*3/uL (ref 0.1–1.0)
Monocytes Relative: 4 %
Neutro Abs: 7.6 10*3/uL (ref 1.7–7.7)
Neutrophils Relative %: 69 %
Platelets: 280 10*3/uL (ref 150–400)
RBC: 4.13 MIL/uL (ref 3.87–5.11)
RDW: 12.8 % (ref 11.5–15.5)
WBC: 11.2 10*3/uL — ABNORMAL HIGH (ref 4.0–10.5)
nRBC: 0 % (ref 0.0–0.2)

## 2022-05-04 LAB — URINALYSIS, ROUTINE W REFLEX MICROSCOPIC
Bilirubin Urine: NEGATIVE
Glucose, UA: NEGATIVE mg/dL
Ketones, ur: 5 mg/dL — AB
Nitrite: NEGATIVE
Protein, ur: 100 mg/dL — AB
RBC / HPF: >50 RBC/hpf — ABNORMAL HIGH (ref 0–5)
Specific Gravity, Urine: 1.026 (ref 1.005–1.030)
pH: 6 (ref 5.0–8.0)

## 2022-05-04 LAB — LIPASE, BLOOD: Lipase: 27 U/L (ref 11–51)

## 2022-05-04 NOTE — ED Provider Triage Note (Signed)
Emergency Medicine Provider Triage Evaluation Note  Lorraine [name] , a 36 y.o. female  was evaluated in triage.  Pt complains of abdominal pain, constipation, nausea, dysuria.  Denies fever, chills.  Reports left flank pain  Review of Systems  Positive: As above Negative: As above  Physical Exam  BP (!) 138/96 (BP Location: Right Arm)   Pulse (!) 104   Temp 98.7 F (37.1 C) (Oral)   Resp 20   LMP 05/04/2022 (Approximate)   SpO2 100%  Gen:   Awake, no distress   Resp:  Normal effort  MSK:   Moves extremities without difficulty  Other:  Generalized abdominal tenderness.  Left flank tenderness present  Medical Decision Making  Medically screening exam initiated at 9:19 PM.  Appropriate orders placed.  Lorraine [name] was informed that the remainder of the evaluation will be completed by another provider, this initial triage assessment does not replace that evaluation, and the importance of remaining in the ED until their evaluation is complete.     Evlyn Courier, PA-C 05/04/22 2121

## 2022-05-04 NOTE — ED Triage Notes (Signed)
Patient here with abdominal pain that started on the 15th of this month.  She has been nausea, vomiting and dizziness.  Patient has been having some constipation.  Last BM yesterday.  She has not been able to eat for the last 24 hours.  She has been having some dysuria.  No vaginal discharge.

## 2022-05-05 ENCOUNTER — Encounter (HOSPITAL_COMMUNITY): Payer: Self-pay

## 2022-05-05 ENCOUNTER — Emergency Department (HOSPITAL_COMMUNITY)
Admission: EM | Admit: 2022-05-05 | Discharge: 2022-05-05 | Disposition: A | Discharge status: Home / Self Care | Payer: Self-pay | Attending: Emergency Medicine | Admitting: Emergency Medicine

## 2022-05-05 DIAGNOSIS — R1032 Left lower quadrant pain: Secondary | ICD-10-CM | POA: Insufficient documentation

## 2022-05-05 DIAGNOSIS — R300 Dysuria: Secondary | ICD-10-CM | POA: Insufficient documentation

## 2022-05-05 DIAGNOSIS — R109 Unspecified abdominal pain: Secondary | ICD-10-CM

## 2022-05-05 DIAGNOSIS — N390 Urinary tract infection, site not specified: Secondary | ICD-10-CM

## 2022-05-05 DIAGNOSIS — R112 Nausea with vomiting, unspecified: Secondary | ICD-10-CM | POA: Insufficient documentation

## 2022-05-05 DIAGNOSIS — N9489 Other specified conditions associated with female genital organs and menstrual cycle: Secondary | ICD-10-CM | POA: Insufficient documentation

## 2022-05-05 LAB — CBC WITH DIFFERENTIAL/PLATELET
Abs Immature Granulocytes: 0.03 10*3/uL (ref 0.00–0.07)
Basophils Absolute: 0 10*3/uL (ref 0.0–0.1)
Basophils Relative: 0 %
Eosinophils Absolute: 0.1 10*3/uL (ref 0.0–0.5)
Eosinophils Relative: 1 %
HCT: 38.8 % (ref 36.0–46.0)
Hemoglobin: 12.7 g/dL (ref 12.0–15.0)
Immature Granulocytes: 0 %
Lymphocytes Relative: 35 %
Lymphs Abs: 3.3 10*3/uL (ref 0.7–4.0)
MCH: 30.5 pg (ref 26.0–34.0)
MCHC: 32.7 g/dL (ref 30.0–36.0)
MCV: 93 fL (ref 80.0–100.0)
Monocytes Absolute: 0.6 10*3/uL (ref 0.1–1.0)
Monocytes Relative: 6 %
Neutro Abs: 5.4 10*3/uL (ref 1.7–7.7)
Neutrophils Relative %: 58 %
Platelets: UNDETERMINED 10*3/uL (ref 150–400)
RBC: 4.17 MIL/uL (ref 3.87–5.11)
RDW: 12.6 % (ref 11.5–15.5)
WBC: 9.5 10*3/uL (ref 4.0–10.5)
nRBC: 0 % (ref 0.0–0.2)

## 2022-05-05 LAB — COMPREHENSIVE METABOLIC PANEL
ALT: 22 U/L (ref 0–44)
AST: 24 U/L (ref 15–41)
Albumin: 4.1 g/dL (ref 3.5–5.0)
Alkaline Phosphatase: 88 U/L (ref 38–126)
Anion gap: 11 (ref 5–15)
BUN: 18 mg/dL (ref 6–20)
CO2: 19 mmol/L — ABNORMAL LOW (ref 22–32)
Calcium: 8.8 mg/dL — ABNORMAL LOW (ref 8.9–10.3)
Chloride: 109 mmol/L (ref 98–111)
Creatinine, Ser: 0.71 mg/dL (ref 0.44–1.00)
GFR, Estimated: >60 mL/min (ref >60)
Glucose, Bld: 97 mg/dL (ref 70–99)
Potassium: 3.6 mmol/L (ref 3.5–5.1)
Sodium: 139 mmol/L (ref 135–145)
Total Bilirubin: 0.6 mg/dL (ref 0.3–1.2)
Total Protein: 7.5 g/dL (ref 6.5–8.1)

## 2022-05-05 LAB — URINALYSIS, ROUTINE W REFLEX MICROSCOPIC
Bilirubin Urine: NEGATIVE
Glucose, UA: NEGATIVE mg/dL
Ketones, ur: 20 mg/dL — AB
Nitrite: NEGATIVE
Protein, ur: 100 mg/dL — AB
RBC / HPF: >50 RBC/hpf — ABNORMAL HIGH (ref 0–5)
Specific Gravity, Urine: 1.027 (ref 1.005–1.030)
pH: 6 (ref 5.0–8.0)

## 2022-05-05 LAB — HCG, QUANTITATIVE, PREGNANCY: hCG, Beta Chain, Quant, S: <1 m[IU]/mL (ref <5)

## 2022-05-05 LAB — LIPASE, BLOOD: Lipase: 30 U/L (ref 11–51)

## 2022-05-05 MED ORDER — CIPROFLOXACIN HCL 500 MG PO TABS: 500.0000 mg | ORAL_TABLET | Freq: Two times a day (BID) | ORAL | 0 refills | Status: DC

## 2022-05-05 MED ORDER — OXYCODONE-ACETAMINOPHEN 5-325 MG PO TABS
1.0000 | ORAL_TABLET | Freq: Once | ORAL | Status: AC
  Administered 2022-05-05: 1 via ORAL
  Filled 2022-05-05: qty 1

## 2022-05-05 MED ORDER — PROMETHAZINE HCL 25 MG PO TABS: 25.0000 mg | ORAL_TABLET | Freq: Four times a day (QID) | ORAL | 0 refills | Status: DC | PRN

## 2022-05-05 NOTE — ED Notes (Signed)
Patient given discharge instructions, all questions answered. Patient in possession of all belongings, directed to the discharge area  

## 2022-05-05 NOTE — ED Provider Notes (Signed)
[facility] DEPT Provider Note   CSN: [id number] Arrival date & time: 05/05/22  1210     History PMH: Diverticulitis, Asthma, Pancreatitis, Pyelonephritis, s/p cholecystectomy Chief Complaint  Patient presents with   Abdominal Pain   Nausea   Emesis   Dysuria    Atiya [name] is a 36 y.o. female. Patient presenting with left lower quadrant tenderness that radiates to her left flank for several days.  She had associated dysuria prior to the symptoms developing.  She has associated nausea and vomiting every time she eats.  She has been seen multiple times for various abdominal symptoms over the past month to several months. She had numerous CT scans that did not show any acute abnormalities.  She was also seen yesterday and diagnosed with a urinary tract infection, however she did not start taking her antibiotics as she did not think they were prescribed to her. .  She denies any chest pain, shortness of breath, diarrhea, constipation, vaginal discharge, fevers   Abdominal Pain Associated symptoms: dysuria, nausea and vomiting   Associated symptoms: no diarrhea and no fever   Emesis Associated symptoms: abdominal pain   Associated symptoms: no diarrhea and no fever   Dysuria Associated symptoms: abdominal pain, flank pain, nausea and vomiting   Associated symptoms: no fever        Home Medications Prior to Admission medications   Medication Sig Start Date End Date Taking? Authorizing Provider  butalbital-acetaminophen-caffeine (FIORICET) 50-325-40 MG tablet Take 1-2 tablets by mouth every 8 (eight) hours as needed for headache. Patient not taking: Reported on 04/29/2022 04/16/22 04/16/23  Antonietta Breach, PA-C  cyclobenzaprine (FLEXERIL) 10 MG tablet Take 10 mg by mouth 2 (two) times daily as needed. Patient not taking: Reported on 04/29/2022 04/21/22   [provider]  dicyclomine (BENTYL) 20 MG tablet Take 1 tablet (20 mg total) by mouth 3  times/day as needed-between meals & bedtime for spasms. 05/01/22   Harriet Pho, PA-C  ibuprofen (ADVIL) 800 MG tablet Take 800 mg by mouth 3 (three) times daily. 04/21/22   [provider]  ondansetron (ZOFRAN) 4 MG tablet Take 1 tablet (4 mg total) by mouth every 6 (six) hours as needed for nausea or vomiting. 05/01/22   Harriet Pho, PA-C  potassium chloride SA (KLOR-CON) 20 MEQ tablet Take 1 tablet (20 mEq total) by mouth daily for 3 days. 06/14/21 06/17/21  Jeanell Sparrow, DO  cetirizine (ZYRTEC ALLERGY) 10 MG tablet Take 1 tablet (10 mg total) by mouth 2 (two) times daily. Patient not taking: Reported on 12/06/2020 11/12/20 01/24/21  Muthersbaugh, Jarrett Soho, PA-C      Allergies    Iodinated contrast media, Lupine bean extract, Ceftriaxone, and Morphine    Review of Systems   Review of Systems  Constitutional:  Negative for fever.  Gastrointestinal:  Positive for abdominal pain, nausea and vomiting. Negative for abdominal distention and diarrhea.  Genitourinary:  Positive for dysuria and flank pain.  All other systems reviewed and are negative.   Physical Exam Updated Vital Signs BP (!) 140/94 (BP Location: Left Arm)   Pulse 79   Temp 98.2 F (36.8 C) (Oral)   Resp 16   Ht 5' (1.524 m)   Wt 80.7 kg   LMP 05/04/2022 (Approximate)   SpO2 99%   BMI 34.76 kg/m  Physical Exam Vitals and nursing note reviewed.  Constitutional:      General: She is not in acute distress.    Appearance: Normal  appearance. She is not ill-appearing, toxic-appearing or diaphoretic.  HENT:     Head: Normocephalic and atraumatic.     Nose: No nasal deformity.     Mouth/Throat:     Lips: Pink. No lesions.     Mouth: Mucous membranes are moist. No injury, lacerations, oral lesions or angioedema.     Pharynx: Oropharynx is clear. Uvula midline. No pharyngeal swelling, oropharyngeal exudate, posterior oropharyngeal erythema or uvula swelling.  Eyes:     General: Gaze aligned appropriately. No  scleral icterus.       Right eye: No discharge.        Left eye: No discharge.     Conjunctiva/sclera: Conjunctivae normal.     Right eye: Right conjunctiva is not injected. No exudate or hemorrhage.    Left eye: Left conjunctiva is not injected. No exudate or hemorrhage.    Pupils: Pupils are equal, round, and reactive to light.  Cardiovascular:     Rate and Rhythm: Normal rate and regular rhythm.     Pulses: Normal pulses.          Radial pulses are 2+ on the right side and 2+ on the left side.       Dorsalis pedis pulses are 2+ on the right side and 2+ on the left side.     Heart sounds: Normal heart sounds, S1 normal and S2 normal. Heart sounds not distant. No murmur heard.    No friction rub. No gallop. No S3 or S4 sounds.  Pulmonary:     Effort: Pulmonary effort is normal. No accessory muscle usage or respiratory distress.     Breath sounds: Normal breath sounds. No stridor. No wheezing, rhonchi or rales.  Chest:     Chest wall: No tenderness.  Abdominal:     General: Abdomen is flat. There is no distension.     Palpations: Abdomen is soft. There is no mass or pulsatile mass.     Tenderness: There is abdominal tenderness in the left lower quadrant. There is left CVA tenderness. There is no guarding or rebound.     Hernia: No hernia is present.  Musculoskeletal:     Right lower leg: No edema.     Left lower leg: No edema.  Skin:    General: Skin is warm and dry.     Coloration: Skin is not jaundiced or pale.     Findings: No bruising, erythema, lesion or rash.  Neurological:     General: No focal deficit present.     Mental Status: She is alert and oriented to person, place, and time.     GCS: GCS eye subscore is 4. GCS verbal subscore is 5. GCS motor subscore is 6.  Psychiatric:        Mood and Affect: Mood normal.        Behavior: Behavior normal. Behavior is cooperative.     ED Results / Procedures / Treatments   Labs (all labs ordered are listed, but only abnormal  results are displayed) Labs Reviewed  COMPREHENSIVE METABOLIC PANEL - Abnormal; Notable for the following components:      Result Value   CO2 19 (*)    Calcium 8.8 (*)    All other components within normal limits  LIPASE, BLOOD  CBC WITH DIFFERENTIAL/PLATELET  CBC WITH DIFFERENTIAL/PLATELET  URINALYSIS, ROUTINE W REFLEX MICROSCOPIC  HCG, QUANTITATIVE, PREGNANCY    EKG None  Radiology No results found.  Procedures Procedures  This patient was on telemetry or cardiac monitoring  during their time in the ED.    Medications Ordered in ED Medications  oxyCODONE-acetaminophen (PERCOCET/ROXICET) 5-325 MG per tablet 1 tablet (1 tablet Oral Given 05/05/22 1925)    ED Course/ Medical Decision Making/ A&P                            Medical Decision Making Amount and/or Complexity of Data Reviewed Labs: ordered.  Risk Prescription drug management.    MDM  This is a 36 y.o. female who presents to the ED with left flank pain and dysuria The differential of this patient includes but is not limited to pyelonephritis, diverticulitis, UTI, kidney stone, MSK  Initial Impression  patient is well-appearing and in no acute distress.  She is afebrile and has stable vitals.  I reviewed her past records and she was just seen on August 12 and August 15 for right lower quadrant abdominal pain.  She had negative Cts as well as a normal pelvic US during those visits.  she also had a negative CT stone study at the beginning of this month.  She was seen again on the 17th with right upper quadrant abdominal pain and ultimately diagnosed with a urinary tract infection.  She did not take the antibiotics that she was prescribed.  The left flank pain started today radiating to her left lower quadrant.  She has no other abdominal symptoms.  Given that she has had multiple negative CT scans of her abdomen, I do not feel that this is indicated at this time. We will repeat her urinalysis and labs.  She is  stable and does not appear septic.  I personally ordered, reviewed, and interpreted all laboratory work and imaging and agree with radiologist interpretation. Results interpreted below:  CBC pending CMP: Normal renal function, Normal LFTs, stable electrolytes Pregnancy pending UA pending  Assessment/Plan:  Workup is still pending prior to shift change.  8:25 PM Care of Lorraine [name] transferred to Dr. Zenia Resides at the end of my shift as the patient will require reassessment once labs/imaging have resulted. Patient presentation, ED course, and plan of care discussed with review of all pertinent labs and imaging. Please see his/her note for further details regarding further ED course and disposition. Plan at time of handoff is f/u on labs, send home on abx if needed. This may be altered or completely changed at the discretion of the oncoming team pending results of further workup.   Charting Requirements Additional history is obtained from:  Independent historian External Records from outside source obtained and reviewed including: Prior CT imaging reports Social Determinants of Health:   [employer] PMH that complicates patient's illness: diverticulitis, pyelonephritis  Patient Care Problems that were addressed during this visit: - Left flank/LLQ pain: Acute illness with systemic symptoms This patient was maintained on a cardiac monitor/telemetry. I personally viewed and interpreted the cardiac monitor which reveals an underlying rhythm of NSR Medications given in ED: Percocet Reevaluation of the patient after these medicines showed that the patient improved I have reviewed home medications and made changes accordingly.  Critical Care Interventions: n/a Consultations: n/a Disposition: see oncoming provider note  This is a shared visit with my attending physician, Dr. Zenia Resides.  We have discussed this patient and they have independently evaluated this patient. The plan was  altered or changed as needed.  Portions of this note were generated with Lobbyist. Dictation errors may occur despite best attempts at proofreading.  Final Clinical Impression(s) / ED Diagnoses Final diagnoses:  Left flank pain    Rx / DC Orders ED Discharge Orders     None         Adolphus Birchwood, PA-C 05/05/22 2025    Lacretia Leigh, MD 05/05/22 2130

## 2022-05-05 NOTE — ED Notes (Signed)
X2 no response and not visible in lobby nor outside

## 2022-05-05 NOTE — ED Triage Notes (Signed)
Spanish interpreter used: Pt c/o ongoing right flank pain that radiates to her back and lower abdomen for the past couple of weeks. Pt reports associated nausea, vomiting, and pain with urination.

## 2022-05-07 ENCOUNTER — Ambulatory Visit (HOSPITAL_COMMUNITY)
Admission: EM | Admit: 2022-05-07 | Discharge: 2022-05-07 | Disposition: A | Discharge status: Home / Self Care | Payer: Self-pay | Attending: Family Medicine | Admitting: Family Medicine

## 2022-05-07 ENCOUNTER — Encounter (HOSPITAL_COMMUNITY): Payer: Self-pay | Admitting: *Deleted

## 2022-05-07 ENCOUNTER — Other Ambulatory Visit: Payer: Self-pay

## 2022-05-07 DIAGNOSIS — H8110 Benign paroxysmal vertigo, unspecified ear: Secondary | ICD-10-CM

## 2022-05-07 MED ORDER — MECLIZINE HCL 25 MG PO TABS: 25.0000 mg | ORAL_TABLET | Freq: Three times a day (TID) | ORAL | 0 refills | Status: DC | PRN

## 2022-05-07 NOTE — Discharge Instructions (Addendum)
Hoy te diagnosticaron vrtigo. Le he dado papeleo para esto para ms informacin. He enviado un medicamento para ayudar con los [employer] y las nuseas. Esto debera [employer] a 1 semana. Te recomiendo que descanses lo suficiente y bebas muchos lquidos. Haga un seguimiento aqu o con su proveedor de atencin primaria si no mejora como se esperaba.  You were diagnosed with vertigo today.  I have given you paperwork for this for more information.  I have sent out a medication to help with dizziness and nausea.  This should resolve in the next several days to 1 week.  I recommend you get plenty of rest and drink plenty of fluids.  Please follow up here or with your primary care provider if not improving as expected.

## 2022-05-07 NOTE — ED Triage Notes (Signed)
Pt reports for 2 days she has had N/V  with HA. Pt also reports the room is spinning . Pt also reports blurred vision.

## 2022-05-07 NOTE — ED Provider Notes (Signed)
[facility]    CSN: [id number] Arrival date & time: 05/07/22  1118      History   Chief Complaint Chief Complaint  Patient presents with   Headache   Emesis   Nausea    HPI Lorraine [name] is a 36 y.o. female.   Patient is here for dizziness and headache.  Started 2 days ago.   Room is spinning. + n/v.   She has had dizziness before, but never lasted this long.  No runny nose, congestion, drainage.   Past Medical History:  Diagnosis Date   Angio-edema    Asthma    Inhaler used 01/02/16   Diverticulitis    Nausea & vomiting 04/02/2017   Pancreatitis    Pre-diabetes    Pyelonephritis    Urticaria     Patient Active Problem List   Diagnosis Date Noted   Intractable vomiting    Acute diverticulitis 09/20/2018   Cervical radiculopathy    Left arm weakness    Left-sided weakness 02/19/2018   Weakness 02/19/2018   Rash 02/09/2018   Chest pain 02/08/2018   Acute pancreatitis 04/02/2017   Abdominal pain, acute, left upper quadrant 04/02/2017   Intractable nausea and vomiting 04/02/2017   Asthma in adult 04/02/2017   Obesity (BMI 30.0-34.9) 04/02/2017   Fatty infiltration of liver 04/02/2017   Gastroesophageal reflux disease without esophagitis    Pre-diabetes 07/15/2015   Diarrhea    Hypokalemia    Bacterial vaginosis    Pyelonephritis 07/07/2015   Abdominal pain, right upper quadrant    Asthma 10/31/2014   History of preterm delivery 10/22/2014   Status post repeat low transverse cesarean section 09/06/2014   Diverticulitis 09/10/2012    Past Surgical History:  Procedure Laterality Date   CESAREAN SECTION     CESAREAN SECTION N/A 2006   CESAREAN SECTION N/A 09/03/2014   Procedure: CESAREAN SECTION;  Surgeon: Guss Bunde, MD;  Location: [facility];  Service: Obstetrics;  Laterality: N/A;   CHOLECYSTECTOMY     ESOPHAGOGASTRODUODENOSCOPY (EGD) WITH PROPOFOL Left 04/07/2017   Procedure: ESOPHAGOGASTRODUODENOSCOPY (EGD) WITH PROPOFOL;  Surgeon:  Ronnette Juniper, MD;  Location: [facility];  Service: Gastroenterology;  Laterality: Left;   TUBAL LIGATION      OB History     Gravida  5   Para  3   Term  1   Preterm  2   AB  2   Living  3      SAB  2   IAB      Ectopic      Multiple  1   Live Births  3            Home Medications    Prior to Admission medications   Medication Sig Start Date End Date Taking? Authorizing Provider  butalbital-acetaminophen-caffeine (FIORICET) 50-325-40 MG tablet Take 1-2 tablets by mouth every 8 (eight) hours as needed for headache. Patient not taking: Reported on 04/29/2022 04/16/22 04/16/23  Antonietta Breach, PA-C  ciprofloxacin (CIPRO) 500 MG tablet Take 1 tablet (500 mg total) by mouth every 12 (twelve) hours. 05/05/22   Lacretia Leigh, MD  cyclobenzaprine (FLEXERIL) 10 MG tablet Take 10 mg by mouth 2 (two) times daily as needed. Patient not taking: Reported on 04/29/2022 04/21/22   [provider]  dicyclomine (BENTYL) 20 MG tablet Take 1 tablet (20 mg total) by mouth 3 times/day as needed-between meals & bedtime for spasms. 05/01/22   Harriet Pho, PA-C  ibuprofen (ADVIL) 800 MG tablet  Take 800 mg by mouth 3 (three) times daily. 04/21/22   [provider]  ondansetron (ZOFRAN) 4 MG tablet Take 1 tablet (4 mg total) by mouth every 6 (six) hours as needed for nausea or vomiting. 05/01/22   Harriet Pho, PA-C  potassium chloride SA (KLOR-CON) 20 MEQ tablet Take 1 tablet (20 mEq total) by mouth daily for 3 days. 06/14/21 06/17/21  Jeanell Sparrow, DO  promethazine (PHENERGAN) 25 MG tablet Take 1 tablet (25 mg total) by mouth every 6 (six) hours as needed for nausea or vomiting. 05/05/22   Lacretia Leigh, MD  cetirizine (ZYRTEC ALLERGY) 10 MG tablet Take 1 tablet (10 mg total) by mouth 2 (two) times daily. Patient not taking: Reported on 12/06/2020 11/12/20 01/24/21  Muthersbaugh, Jarrett Soho, PA-C    Family History Family History  Problem Relation Age of Onset   Hyperlipidemia  Mother    Diabetes Father    Ulcers Father    Diabetes Maternal Uncle    Diabetes Paternal Grandmother    Asthma Daughter    Allergic rhinitis Neg Hx    Angioedema Neg Hx    Eczema Neg Hx    Immunodeficiency Neg Hx    Urticaria Neg Hx     Social History Social History   Tobacco Use   Smoking status: Never   Smokeless tobacco: Never  Vaping Use   Vaping Use: Never used  Substance Use Topics   Alcohol use: No   Drug use: No     Allergies   Iodinated contrast media, Lupine bean extract, Ceftriaxone, and Morphine   Review of Systems Review of Systems  Constitutional: Negative.   HENT: Negative.    Respiratory: Negative.    Cardiovascular: Negative.   Gastrointestinal:  Positive for nausea and vomiting.  Musculoskeletal: Negative.   Skin: Negative.   Neurological:  Positive for dizziness and headaches.     Physical Exam Triage Vital Signs ED Triage Vitals  Enc Vitals Group     BP 05/07/22 1219 (!) 143/83     Pulse Rate 05/07/22 1219 76     Resp 05/07/22 1219 18     Temp 05/07/22 1219 99.1 F (37.3 C)     Temp src --      SpO2 05/07/22 1219 96 %     Weight --      Height --      Head Circumference --      Peak Flow --      Pain Score 05/07/22 1216 5     Pain Loc --      Pain Edu? --      Excl. in [place]? --    No data found.  Updated Vital Signs BP (!) 143/83   Pulse 76   Temp 99.1 F (37.3 C)   Resp 18   LMP 05/04/2022 (Approximate)   SpO2 96%   Visual Acuity Right Eye Distance:   Left Eye Distance:   Bilateral Distance:    Right Eye Near:   Left Eye Near:    Bilateral Near:     Physical Exam Constitutional:      Appearance: Normal appearance.  HENT:     Head: Normocephalic and atraumatic.     Right Ear: Tympanic membrane normal.     Left Ear: Tympanic membrane normal.     Mouth/Throat:     Mouth: Mucous membranes are moist.  Cardiovascular:     Rate and Rhythm: Normal rate and regular rhythm.  Pulmonary:  Effort: Pulmonary  effort is normal.     Breath sounds: Normal breath sounds.  Abdominal:     Palpations: Abdomen is soft.  Musculoskeletal:        General: Normal range of motion.     Cervical back: Normal range of motion.  Skin:    General: Skin is warm.  Neurological:     General: No focal deficit present.     Mental Status: She is alert and oriented to person, place, and time.     Cranial Nerves: Cranial nerves 2-12 are intact.     Sensory: Sensation is intact.     Motor: Motor function is intact.     Coordination: Coordination is intact.     Comments: Dix halpike positive bilaterally  Psychiatric:        Mood and Affect: Mood normal.      UC Treatments / Results  Labs (all labs ordered are listed, but only abnormal results are displayed) Labs Reviewed - No data to display  EKG   Radiology No results found.  Procedures Procedures (including critical care time)  Medications Ordered in UC Medications - No data to display  Initial Impression / Assessment and Plan / UC Course  I have reviewed the triage vital signs and the nursing notes.  Pertinent labs & imaging results that were available during my care of the patient were reviewed by me and considered in my medical decision making (see chart for details).    Final Clinical Impressions(s) / UC Diagnoses   Final diagnoses:  Benign paroxysmal positional vertigo, unspecified laterality     Discharge Instructions      Hoy te diagnosticaron vrtigo. Le he dado papeleo para esto para ms informacin. He enviado un medicamento para ayudar con los [employer] y las nuseas. Esto debera [employer] a 1 semana. Te recomiendo que descanses lo suficiente y bebas muchos lquidos. Haga un seguimiento aqu o con su proveedor de atencin primaria si no mejora como se esperaba.  You were diagnosed with vertigo today.  I have given you paperwork for this for more information.  I have sent out a medication to help with  dizziness and nausea.  This should resolve in the next several days to 1 week.  I recommend you get plenty of rest and drink plenty of fluids.  Please follow up here or with your primary care provider if not improving as expected.     ED Prescriptions     Medication Sig Dispense Auth. Provider   meclizine (ANTIVERT) 25 MG tablet Take 1 tablet (25 mg total) by mouth 3 (three) times daily as needed for dizziness. 30 tablet Rondel Oh, MD      PDMP not reviewed this encounter.   Rondel Oh, MD 05/07/22 1258

## 2022-05-31 ENCOUNTER — Emergency Department (HOSPITAL_COMMUNITY): Payer: No Typology Code available for payment source

## 2022-05-31 ENCOUNTER — Emergency Department (HOSPITAL_COMMUNITY)
Admission: EM | Admit: 2022-05-31 | Discharge: 2022-05-31 | Disposition: A | Discharge status: Home / Self Care | Payer: No Typology Code available for payment source | Attending: Emergency Medicine | Admitting: Emergency Medicine

## 2022-05-31 ENCOUNTER — Encounter (HOSPITAL_COMMUNITY): Payer: Self-pay

## 2022-05-31 DIAGNOSIS — M545 Low back pain, unspecified: Secondary | ICD-10-CM | POA: Insufficient documentation

## 2022-05-31 DIAGNOSIS — Y92410 Unspecified street and highway as the place of occurrence of the external cause: Secondary | ICD-10-CM | POA: Insufficient documentation

## 2022-05-31 DIAGNOSIS — J45909 Unspecified asthma, uncomplicated: Secondary | ICD-10-CM | POA: Insufficient documentation

## 2022-05-31 DIAGNOSIS — R202 Paresthesia of skin: Secondary | ICD-10-CM | POA: Diagnosis not present

## 2022-05-31 DIAGNOSIS — M542 Cervicalgia: Secondary | ICD-10-CM | POA: Diagnosis present

## 2022-05-31 LAB — CBC WITH DIFFERENTIAL/PLATELET
Abs Immature Granulocytes: 0.05 10*3/uL (ref 0.00–0.07)
Basophils Absolute: 0.1 10*3/uL (ref 0.0–0.1)
Basophils Relative: 1 %
Eosinophils Absolute: 0.1 10*3/uL (ref 0.0–0.5)
Eosinophils Relative: 1 %
HCT: 41.9 % (ref 36.0–46.0)
Hemoglobin: 13.3 g/dL (ref 12.0–15.0)
Immature Granulocytes: 1 %
Lymphocytes Relative: 31 %
Lymphs Abs: 2.7 10*3/uL (ref 0.7–4.0)
MCH: 30.3 pg (ref 26.0–34.0)
MCHC: 31.7 g/dL (ref 30.0–36.0)
MCV: 95.4 fL (ref 80.0–100.0)
Monocytes Absolute: 0.6 10*3/uL (ref 0.1–1.0)
Monocytes Relative: 7 %
Neutro Abs: 5.3 10*3/uL (ref 1.7–7.7)
Neutrophils Relative %: 59 %
Platelets: 255 10*3/uL (ref 150–400)
RBC: 4.39 MIL/uL (ref 3.87–5.11)
RDW: 12.7 % (ref 11.5–15.5)
WBC: 8.7 10*3/uL (ref 4.0–10.5)
nRBC: 0 % (ref 0.0–0.2)

## 2022-05-31 LAB — I-STAT CHEM 8, ED
BUN: 22 mg/dL — ABNORMAL HIGH (ref 6–20)
Calcium, Ion: 0.91 mmol/L — ABNORMAL LOW (ref 1.15–1.40)
Chloride: 108 mmol/L (ref 98–111)
Creatinine, Ser: 0.6 mg/dL (ref 0.44–1.00)
Glucose, Bld: 113 mg/dL — ABNORMAL HIGH (ref 70–99)
HCT: 37 % (ref 36.0–46.0)
Hemoglobin: 12.6 g/dL (ref 12.0–15.0)
Potassium: 4.9 mmol/L (ref 3.5–5.1)
Sodium: 136 mmol/L (ref 135–145)
TCO2: 21 mmol/L — ABNORMAL LOW (ref 22–32)

## 2022-05-31 LAB — I-STAT BETA HCG BLOOD, ED (MC, WL, AP ONLY): I-stat hCG, quantitative: <5 m[IU]/mL (ref <5)

## 2022-05-31 MED ORDER — HYDROCODONE-ACETAMINOPHEN 5-325 MG PO TABS
1.0000 | ORAL_TABLET | Freq: Once | ORAL | Status: AC
  Administered 2022-05-31: 1 via ORAL
  Filled 2022-05-31: qty 1

## 2022-05-31 MED ORDER — NAPROXEN 500 MG PO TABS: 500.0000 mg | ORAL_TABLET | Freq: Two times a day (BID) | ORAL | 0 refills | Status: DC

## 2022-05-31 MED ORDER — METHOCARBAMOL 500 MG PO TABS: 500.0000 mg | ORAL_TABLET | Freq: Two times a day (BID) | ORAL | 0 refills | Status: DC

## 2022-05-31 MED ORDER — METHOCARBAMOL 500 MG PO TABS
500.0000 mg | ORAL_TABLET | Freq: Once | ORAL | Status: AC
  Administered 2022-05-31: 500 mg via ORAL
  Filled 2022-05-31: qty 1

## 2022-05-31 MED ORDER — KETOROLAC TROMETHAMINE 60 MG/2ML IM SOLN
60.0000 mg | Freq: Once | INTRAMUSCULAR | Status: AC
  Administered 2022-05-31: 60 mg via INTRAMUSCULAR
  Filled 2022-05-31: qty 2
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
## 2022-05-31 NOTE — Discharge Instructions (Addendum)
Tome AINE o Tylenol segn sea necesario durante la prxima semana. Tome este medicamento con alimentos. Tome un relajante muscular antes de acostarse para ayudarle a dormir. Este medicamento produce somnolencia, por lo que no lo tome antes de Forensic psychologist o Fish farm manager. Use una almohadilla trmica para los msculos adoloridos; sela durante 20 minutos varias veces al da. Pruebe ejercicios suaves de rango de movimiento. Regresar si los sntomas empeoran   Take NSAIDs or Tylenol as needed for the next week. Take this medicine with food. Take muscle relaxer at bedtime to help you sleep. This medicine makes you drowsy so do not take before driving or work Use a heating pad for sore muscles - use for 20 minutes several times a day Try gentle range of motion exercises Return for worsening symptoms

## 2022-05-31 NOTE — ED Notes (Signed)
Pt was able to ambulate w/ no problem. Reports she is stiff, but was steady on her feet.
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
## 2022-06-19 ENCOUNTER — Encounter (HOSPITAL_COMMUNITY): Payer: Self-pay | Admitting: Emergency Medicine

## 2022-06-19 ENCOUNTER — Emergency Department (HOSPITAL_COMMUNITY): Payer: Self-pay

## 2022-06-19 ENCOUNTER — Emergency Department (HOSPITAL_COMMUNITY)
Admission: EM | Admit: 2022-06-19 | Discharge: 2022-06-20 | Disposition: A | Discharge status: Home / Self Care | Payer: Self-pay | Attending: Emergency Medicine | Admitting: Emergency Medicine

## 2022-06-19 DIAGNOSIS — R509 Fever, unspecified: Secondary | ICD-10-CM | POA: Insufficient documentation

## 2022-06-19 DIAGNOSIS — R109 Unspecified abdominal pain: Secondary | ICD-10-CM

## 2022-06-19 DIAGNOSIS — J45909 Unspecified asthma, uncomplicated: Secondary | ICD-10-CM | POA: Insufficient documentation

## 2022-06-19 DIAGNOSIS — R1032 Left lower quadrant pain: Secondary | ICD-10-CM | POA: Insufficient documentation

## 2022-06-19 LAB — CBC WITH DIFFERENTIAL/PLATELET
Abs Immature Granulocytes: 0.03 10*3/uL (ref 0.00–0.07)
Basophils Absolute: 0.1 10*3/uL (ref 0.0–0.1)
Basophils Relative: 1 %
Eosinophils Absolute: 0.1 10*3/uL (ref 0.0–0.5)
Eosinophils Relative: 1 %
HCT: 37.2 % (ref 36.0–46.0)
Hemoglobin: 12.1 g/dL (ref 12.0–15.0)
Immature Granulocytes: 0 %
Lymphocytes Relative: 37 %
Lymphs Abs: 3.5 10*3/uL (ref 0.7–4.0)
MCH: 30 pg (ref 26.0–34.0)
MCHC: 32.5 g/dL (ref 30.0–36.0)
MCV: 92.3 fL (ref 80.0–100.0)
Monocytes Absolute: 0.5 10*3/uL (ref 0.1–1.0)
Monocytes Relative: 5 %
Neutro Abs: 5.3 10*3/uL (ref 1.7–7.7)
Neutrophils Relative %: 56 %
Platelets: 222 10*3/uL (ref 150–400)
RBC: 4.03 MIL/uL (ref 3.87–5.11)
RDW: 12.4 % (ref 11.5–15.5)
WBC: 9.5 10*3/uL (ref 4.0–10.5)
nRBC: 0 % (ref 0.0–0.2)

## 2022-06-19 LAB — URINALYSIS, ROUTINE W REFLEX MICROSCOPIC
Bilirubin Urine: NEGATIVE
Glucose, UA: NEGATIVE mg/dL
Hgb urine dipstick: NEGATIVE
Ketones, ur: 80 mg/dL — AB
Leukocytes,Ua: NEGATIVE
Nitrite: NEGATIVE
Protein, ur: NEGATIVE mg/dL
Specific Gravity, Urine: 1.025 (ref 1.005–1.030)
pH: 5 (ref 5.0–8.0)

## 2022-06-19 LAB — COMPREHENSIVE METABOLIC PANEL
ALT: 18 U/L (ref 0–44)
AST: 22 U/L (ref 15–41)
Albumin: 4.3 g/dL (ref 3.5–5.0)
Alkaline Phosphatase: 87 U/L (ref 38–126)
Anion gap: 6 (ref 5–15)
BUN: 21 mg/dL — ABNORMAL HIGH (ref 6–20)
CO2: 27 mmol/L (ref 22–32)
Calcium: 9.3 mg/dL (ref 8.9–10.3)
Chloride: 107 mmol/L (ref 98–111)
Creatinine, Ser: 0.77 mg/dL (ref 0.44–1.00)
GFR, Estimated: >60 mL/min (ref >60)
Glucose, Bld: 87 mg/dL (ref 70–99)
Potassium: 3 mmol/L — ABNORMAL LOW (ref 3.5–5.1)
Sodium: 140 mmol/L (ref 135–145)
Total Bilirubin: 0.4 mg/dL (ref 0.3–1.2)
Total Protein: 7.5 g/dL (ref 6.5–8.1)

## 2022-06-19 LAB — I-STAT BETA HCG BLOOD, ED (MC, WL, AP ONLY): I-stat hCG, quantitative: <5 m[IU]/mL (ref <5)

## 2022-06-19 LAB — LIPASE, BLOOD: Lipase: 34 U/L (ref 11–51)

## 2022-06-19 NOTE — ED Triage Notes (Signed)
Pt reports that she has been having LRQ pain and hip pain for 3 days. Pain in R hip when she bends over. Pain is constant. Pt reports she has been having body aches but unsure of fevers. She does endorse pain in belly when she urinates or has BM. Denies diarrhea or vomiting.

## 2022-06-19 NOTE — ED Provider Triage Note (Signed)
Emergency Medicine Provider Triage Evaluation Note  Lorraine [name] , a 36 y.o. female  was evaluated in triage.  Pt complains of lower quadrant pain for 3 days that has been gradually worsening.  She says it sometimes radiates into her left flank which wax and wanes.  She reports having body aches and chills but unsure of fevers.  Pain worsens when she has a bowel movement or when she urinates, but she does not have any other urinary symptoms, diarrhea, or vomiting.  Review of Systems  Positive:  Negative:   Physical Exam  BP (!) 133/99 (BP Location: Left Arm)   Pulse 77   Temp 98.5 F (36.9 C) (Oral)   Resp 17   Ht 5' (1.524 m)   Wt 78.9 kg   LMP 06/07/2022   SpO2 99%   BMI 33.98 kg/m  Gen:   Awake, no distress   Resp:  Normal effort  MSK:   Moves extremities without difficulty  Other:  +LLQ and L CVA tenderness  Medical Decision Making  Medically screening exam initiated at 8:30 PM.  Appropriate orders placed.  Priscila Perez-Matiano was informed that the remainder of the evaluation will be completed by another provider, this initial triage assessment does not replace that evaluation, and the importance of remaining in the ED until their evaluation is complete.     Adolphus Birchwood, [place] 06/19/22 2031

## 2022-06-20 ENCOUNTER — Telehealth: Payer: Self-pay

## 2022-06-20 MED ORDER — OXYCODONE HCL 5 MG PO TABS
10.0000 mg | ORAL_TABLET | Freq: Once | ORAL | Status: AC
  Administered 2022-06-20: 10 mg via ORAL
  Filled 2022-06-20: qty 2

## 2022-06-20 MED ORDER — AMOXICILLIN-POT CLAVULANATE 875-125 MG PO TABS: 1.0000 | ORAL_TABLET | Freq: Two times a day (BID) | ORAL | 0 refills | Status: AC

## 2022-06-20 MED ORDER — CIPROFLOXACIN HCL 500 MG PO TABS
500.0000 mg | ORAL_TABLET | Freq: Once | ORAL | Status: AC
  Administered 2022-06-20: 500 mg via ORAL
  Filled 2022-06-20: qty 1

## 2022-06-20 MED ORDER — NAPROXEN 500 MG PO TABS: 500.0000 mg | ORAL_TABLET | Freq: Two times a day (BID) | ORAL | 0 refills | Status: DC

## 2022-06-20 MED ORDER — METRONIDAZOLE 500 MG PO TABS
500.0000 mg | ORAL_TABLET | Freq: Once | ORAL | Status: AC
  Administered 2022-06-20: 500 mg via ORAL
  Filled 2022-06-20: qty 1

## 2022-06-20 NOTE — Telephone Encounter (Signed)
Cirigliano, Dominic Pea, DO  Marice Potter, RN ER sent me this, but has been followed by Dr. Therisa Doyne at [facility]. Can you send this to them for review and follow-up scheduling? Thanks

## 2022-06-20 NOTE — Telephone Encounter (Signed)
ER notes faxed to [facility] at [phone].

## 2022-06-20 NOTE — Discharge Instructions (Signed)
You were evaluated in the Emergency Department and after careful evaluation, we did not find any emergent condition requiring admission or further testing in the hospital.  Your exam/testing today is overall reassuring.  Take the Augmentin antibiotic as directed, use the Naprosyn as needed for pain.  Follow-up with your regular doctor or the gastroenterologist.  Please return to the Emergency Department if you experience any worsening of your condition.   Thank you for allowing [place] to be a part of your care.

## 2022-06-20 NOTE — ED Provider Notes (Signed)
[facility] Emergency Department Provider Note MRN:  [id number]  Arrival date & time: 06/20/22     Chief Complaint   Abdominal Pain   History of Present Illness   Lorraine [name] is a 36 y.o. year-old female with no pertinent past medical history presenting to the ED with chief complaint of abdominal pain.  Left lower quadrant and left flank pain for the past 3 days.  Has happened before but has come back and is more severe.  Subjective fever at home, no chest pain or shortness of breath, burning with urination, pain is worse with movements.  Review of Systems  A thorough review of systems was obtained and all systems are negative except as noted in the HPI and PMH.   Patient's Health History    Past Medical History:  Diagnosis Date   Angio-edema    Asthma    Inhaler used 01/02/16   Diverticulitis    Nausea & vomiting 04/02/2017   Pancreatitis    Pre-diabetes    Pyelonephritis    Urticaria     Past Surgical History:  Procedure Laterality Date   CESAREAN SECTION     CESAREAN SECTION N/A 2006   CESAREAN SECTION N/A 09/03/2014   Procedure: CESAREAN SECTION;  Surgeon: Guss Bunde, MD;  Location: [facility];  Service: Obstetrics;  Laterality: N/A;   CHOLECYSTECTOMY     ESOPHAGOGASTRODUODENOSCOPY (EGD) WITH PROPOFOL Left 04/07/2017   Procedure: ESOPHAGOGASTRODUODENOSCOPY (EGD) WITH PROPOFOL;  Surgeon: Ronnette Juniper, MD;  Location: [facility];  Service: Gastroenterology;  Laterality: Left;   TUBAL LIGATION      Family History  Problem Relation Age of Onset   Hyperlipidemia Mother    Diabetes Father    Ulcers Father    Diabetes Maternal Uncle    Diabetes Paternal Grandmother    Asthma Daughter    Allergic rhinitis Neg Hx    Angioedema Neg Hx    Eczema Neg Hx    Immunodeficiency Neg Hx    Urticaria Neg Hx     Social History   Socioeconomic History   Marital status: Married    Spouse name: Not on file   Number of children: Not on file    Years of education: Not on file   Highest education level: Not on file  Occupational History   Not on file  Tobacco Use   Smoking status: Never   Smokeless tobacco: Never  Vaping Use   Vaping Use: Never used  Substance and Sexual Activity   Alcohol use: No   Drug use: No   Sexual activity: Yes    Birth control/protection: Surgical    Comment: tubal ligation that got untied by themselves in june 2018  Other Topics Concern   Not on file  Social History Narrative   ** Merged History Encounter **       Social Determinants of Health   Financial Resource Strain: Not on file  Food Insecurity: Not on file  Transportation Needs: Not on file  Physical Activity: Not on file  Stress: Not on file  Social Connections: Not on file  Intimate Partner Violence: Not on file     Physical Exam   Vitals:   06/20/22 0015 06/20/22 0100  BP: 120/79 121/73  Pulse: 78 71  Resp: (!) 23 (!) 24  Temp: 98.5 F (36.9 C)   SpO2: 99% 98%    CONSTITUTIONAL: Well-appearing, NAD NEURO/PSYCH:  Alert and oriented x 3, no focal deficits EYES:  eyes equal and reactive ENT/NECK:  no LAD, no JVD CARDIO: Regular rate, well-perfused, normal S1 and S2 PULM:  CTAB no wheezing or rhonchi GI/GU:  non-distended, mild left lower quadrant tenderness to palpation MSK/SPINE:  No gross deformities, no edema SKIN:  no rash, atraumatic   *Additional and/or pertinent findings included in MDM below  Diagnostic and Interventional Summary    EKG Interpretation  Date/Time:    Ventricular Rate:    PR Interval:    QRS Duration:   QT Interval:    QTC Calculation:   R Axis:     Text Interpretation:         Labs Reviewed  COMPREHENSIVE METABOLIC PANEL - Abnormal; Notable for the following components:      Result Value   Potassium 3.0 (*)    BUN 21 (*)    All other components within normal limits  URINALYSIS, ROUTINE W REFLEX MICROSCOPIC - Abnormal; Notable for the following components:   Ketones, ur 80  (*)    All other components within normal limits  CBC WITH DIFFERENTIAL/PLATELET  LIPASE, BLOOD  I-STAT BETA HCG BLOOD, ED ([facility], [facility], [facility] ONLY)    CT Renal Stone Study  Final Result      Medications  ciprofloxacin (CIPRO) tablet 500 mg (500 mg Oral Given 06/20/22 0040)  metroNIDAZOLE (FLAGYL) tablet 500 mg (500 mg Oral Given 06/20/22 0040)  oxyCODONE (Oxy IR/ROXICODONE) immediate release tablet 10 mg (10 mg Oral Given 06/20/22 0040)     Procedures  /  Critical Care Procedures  ED Course and Medical Decision Making  Initial Impression and Ddx Differential diagnosis includes ovarian cyst, diverticulitis, pyelonephritis, kidney stone.  Over the past few months patient has had 6 or more ED visits for similar pain, seems to be becoming more of a chronic issue.  She did have acute diverticulitis diagnosed earlier in the year.  Past medical/surgical history that increases complexity of ED encounter: None  Interpretation of Diagnostics I personally reviewed the laboratory assessment and my interpretation is as follows: No significant blood count or electrolyte disturbance, no evidence of UTI  CT abdomen is largely unremarkable, evidence of diverticulosis  Patient Reassessment and Ultimate Disposition/Management     Patient with continued pain, the CT scan is noncontrast and clinically there is some suspicion for diverticulitis.  Difficult given that patient has had repeated presentations for the same pain, repeated courses of antibiotics, stressed the importance of PCP or GI follow-up.  Patient management required discussion with the following services or consulting groups:  None  Complexity of Problems Addressed Acute illness or injury that poses threat of life of bodily function  Additional Data Reviewed and Analyzed Further history obtained from: Further history from spouse/family member  Additional Factors Impacting ED Encounter Risk Prescriptions  Barth Kirks. [place] Small, [place] [email or web address]  Final Clinical Impressions(s) / ED Diagnoses     ICD-10-CM   1. Flank pain  R10.9       ED Discharge Orders          Ordered    naproxen (NAPROSYN) 500 MG tablet  2 times daily        06/20/22 0143    amoxicillin-clavulanate (AUGMENTIN) 875-125 MG tablet  Every 12 hours        06/20/22 0143             Discharge Instructions Discussed with and Provided to Patient:     Discharge Instructions      You were evaluated in the Emergency  Department and after careful evaluation, we did not find any emergent condition requiring admission or further testing in the hospital.  Your exam/testing today is overall reassuring.  Take the Augmentin antibiotic as directed, use the Naprosyn as needed for pain.  Follow-up with your regular doctor or the gastroenterologist.  Please return to the Emergency Department if you experience any worsening of your condition.   Thank you for allowing [place] to be a part of your care.       Maudie Flakes, MD 06/20/22 [phone]

## 2022-07-08 ENCOUNTER — Encounter (HOSPITAL_COMMUNITY): Payer: Self-pay | Admitting: Emergency Medicine

## 2022-07-08 ENCOUNTER — Emergency Department (HOSPITAL_COMMUNITY)
Admission: EM | Admit: 2022-07-08 | Discharge: 2022-07-08 | Discharge status: Against Medical Advice | Payer: Self-pay | Attending: Emergency Medicine | Admitting: Emergency Medicine

## 2022-07-08 DIAGNOSIS — R0602 Shortness of breath: Secondary | ICD-10-CM | POA: Insufficient documentation

## 2022-07-08 DIAGNOSIS — J020 Streptococcal pharyngitis: Secondary | ICD-10-CM

## 2022-07-08 DIAGNOSIS — J029 Acute pharyngitis, unspecified: Secondary | ICD-10-CM | POA: Insufficient documentation

## 2022-07-08 DIAGNOSIS — M791 Myalgia, unspecified site: Secondary | ICD-10-CM | POA: Insufficient documentation

## 2022-07-08 DIAGNOSIS — Z5321 Procedure and treatment not carried out due to patient leaving prior to being seen by health care provider: Secondary | ICD-10-CM | POA: Insufficient documentation

## 2022-07-08 DIAGNOSIS — Z1152 Encounter for screening for COVID-19: Secondary | ICD-10-CM | POA: Insufficient documentation

## 2022-07-08 DIAGNOSIS — R509 Fever, unspecified: Secondary | ICD-10-CM | POA: Insufficient documentation

## 2022-07-08 LAB — SARS CORONAVIRUS 2 BY RT PCR: SARS Coronavirus 2 by RT PCR: NEGATIVE

## 2022-07-08 NOTE — ED Triage Notes (Signed)
Pt reports sore throat, body aches, fever, chills, and SOB when lying down x 2 days.

## 2022-07-08 NOTE — ED Notes (Signed)
No answer from pt when called for room x 3.

## 2022-07-08 NOTE — ED Notes (Signed)
No answer from pt when called for room x 1.  

## 2022-07-08 NOTE — ED Notes (Signed)
No answer from pt when called for room x 2.  

## 2022-07-09 ENCOUNTER — Ambulatory Visit: Payer: Self-pay | Admitting: Nurse Practitioner

## 2022-07-18 ENCOUNTER — Encounter (HOSPITAL_COMMUNITY): Payer: Self-pay

## 2022-07-18 ENCOUNTER — Emergency Department (HOSPITAL_COMMUNITY)
Admission: EM | Admit: 2022-07-18 | Discharge: 2022-07-19 | Disposition: A | Discharge status: Home / Self Care | Payer: Self-pay | Attending: Emergency Medicine | Admitting: Emergency Medicine

## 2022-07-18 DIAGNOSIS — R1084 Generalized abdominal pain: Secondary | ICD-10-CM | POA: Insufficient documentation

## 2022-07-18 DIAGNOSIS — R1032 Left lower quadrant pain: Secondary | ICD-10-CM

## 2022-07-18 DIAGNOSIS — R112 Nausea with vomiting, unspecified: Secondary | ICD-10-CM | POA: Insufficient documentation

## 2022-07-18 DIAGNOSIS — R197 Diarrhea, unspecified: Secondary | ICD-10-CM | POA: Insufficient documentation

## 2022-07-18 LAB — CBC WITH DIFFERENTIAL/PLATELET
Abs Immature Granulocytes: 0.1 10*3/uL — ABNORMAL HIGH (ref 0.00–0.07)
Basophils Absolute: 0.1 10*3/uL (ref 0.0–0.1)
Basophils Relative: 1 %
Eosinophils Absolute: 0.1 10*3/uL (ref 0.0–0.5)
Eosinophils Relative: 1 %
HCT: 37.1 % (ref 36.0–46.0)
Hemoglobin: 12.3 g/dL (ref 12.0–15.0)
Immature Granulocytes: 1 %
Lymphocytes Relative: 32 %
Lymphs Abs: 3 10*3/uL (ref 0.7–4.0)
MCH: 30.4 pg (ref 26.0–34.0)
MCHC: 33.2 g/dL (ref 30.0–36.0)
MCV: 91.6 fL (ref 80.0–100.0)
Monocytes Absolute: 0.4 10*3/uL (ref 0.1–1.0)
Monocytes Relative: 5 %
Neutro Abs: 5.8 10*3/uL (ref 1.7–7.7)
Neutrophils Relative %: 60 %
Platelets: 280 10*3/uL (ref 150–400)
RBC: 4.05 MIL/uL (ref 3.87–5.11)
RDW: 12.2 % (ref 11.5–15.5)
WBC: 9.4 10*3/uL (ref 4.0–10.5)
nRBC: 0 % (ref 0.0–0.2)

## 2022-07-18 LAB — COMPREHENSIVE METABOLIC PANEL
ALT: 18 U/L (ref 0–44)
AST: 20 U/L (ref 15–41)
Albumin: 4.2 g/dL (ref 3.5–5.0)
Alkaline Phosphatase: 78 U/L (ref 38–126)
Anion gap: 4 — ABNORMAL LOW (ref 5–15)
BUN: 23 mg/dL — ABNORMAL HIGH (ref 6–20)
CO2: 26 mmol/L (ref 22–32)
Calcium: 9.1 mg/dL (ref 8.9–10.3)
Chloride: 110 mmol/L (ref 98–111)
Creatinine, Ser: 0.71 mg/dL (ref 0.44–1.00)
GFR, Estimated: >60 mL/min (ref >60)
Glucose, Bld: 103 mg/dL — ABNORMAL HIGH (ref 70–99)
Potassium: 3.5 mmol/L (ref 3.5–5.1)
Sodium: 140 mmol/L (ref 135–145)
Total Bilirubin: 0.5 mg/dL (ref 0.3–1.2)
Total Protein: 7.6 g/dL (ref 6.5–8.1)

## 2022-07-18 LAB — LIPASE, BLOOD: Lipase: 30 U/L (ref 11–51)

## 2022-07-18 MED ORDER — DICYCLOMINE HCL 10 MG PO CAPS
10.0000 mg | ORAL_CAPSULE | Freq: Once | ORAL | Status: AC
  Administered 2022-07-18: 10 mg via ORAL
  Filled 2022-07-18: qty 1

## 2022-07-18 MED ORDER — ONDANSETRON 4 MG PO TBDP
4.0000 mg | ORAL_TABLET | Freq: Once | ORAL | Status: AC
  Administered 2022-07-18: 4 mg via ORAL
  Filled 2022-07-18: qty 1

## 2022-07-18 NOTE — ED Notes (Signed)
Urine cup given for sample

## 2022-07-18 NOTE — ED Triage Notes (Signed)
Pt arrived via POV, c/o abd pain, n/v diarrhea. States concerned it could be due to sandwich from gas station.

## 2022-07-18 NOTE — ED Provider Triage Note (Signed)
Emergency Medicine Provider Triage Evaluation Note  Lorraine [name] , a 36 y.o. female  was evaluated in triage.  Pt complains of abdominal pain, nausea, vomiting. States she ate a chicken sandwich for lunch and thought there might be raw chicken in it. States she has had symptoms since then. >5 episodes of vomiting. 3 episodes of nonbloody diarrhea. Spouse also has similar symptoms. .  Review of Systems  Positive:  Negative: See above  Physical Exam  BP (!) 156/104   Pulse 92   Temp 98.3 F (36.8 C) (Oral)   Resp 18   LMP 06/07/2022   SpO2 95%  Gen:   Awake, no distress   Resp:  Normal effort  MSK:   Moves extremities without difficulty  Other:  Abdomen soft   Medical Decision Making  Medically screening exam initiated at 6:41 PM.  Appropriate orders placed.  Letta Perez-Matiano was informed that the remainder of the evaluation will be completed by another provider, this initial triage assessment does not replace that evaluation, and the importance of remaining in the ED until their evaluation is complete.     Mickie Hillier, PA-C 07/18/22 1842

## 2022-07-19 ENCOUNTER — Emergency Department (HOSPITAL_COMMUNITY): Payer: Self-pay

## 2022-07-19 ENCOUNTER — Emergency Department (HOSPITAL_COMMUNITY)
Admission: EM | Admit: 2022-07-19 | Discharge: 2022-07-19 | Disposition: A | Discharge status: Home / Self Care | Payer: Self-pay | Attending: Emergency Medicine | Admitting: Emergency Medicine

## 2022-07-19 ENCOUNTER — Other Ambulatory Visit: Payer: Self-pay

## 2022-07-19 ENCOUNTER — Encounter (HOSPITAL_COMMUNITY): Payer: Self-pay

## 2022-07-19 DIAGNOSIS — N9489 Other specified conditions associated with female genital organs and menstrual cycle: Secondary | ICD-10-CM | POA: Insufficient documentation

## 2022-07-19 DIAGNOSIS — R109 Unspecified abdominal pain: Secondary | ICD-10-CM | POA: Insufficient documentation

## 2022-07-19 DIAGNOSIS — K921 Melena: Secondary | ICD-10-CM | POA: Insufficient documentation

## 2022-07-19 LAB — COMPREHENSIVE METABOLIC PANEL
ALT: 20 U/L (ref 0–44)
AST: 27 U/L (ref 15–41)
Albumin: 4.1 g/dL (ref 3.5–5.0)
Alkaline Phosphatase: 94 U/L (ref 38–126)
Anion gap: 6 (ref 5–15)
BUN: 23 mg/dL — ABNORMAL HIGH (ref 6–20)
CO2: 25 mmol/L (ref 22–32)
Calcium: 9.1 mg/dL (ref 8.9–10.3)
Chloride: 109 mmol/L (ref 98–111)
Creatinine, Ser: 0.86 mg/dL (ref 0.44–1.00)
GFR, Estimated: >60 mL/min (ref >60)
Glucose, Bld: 96 mg/dL (ref 70–99)
Potassium: 4.3 mmol/L (ref 3.5–5.1)
Sodium: 140 mmol/L (ref 135–145)
Total Bilirubin: 0.5 mg/dL (ref 0.3–1.2)
Total Protein: 7.4 g/dL (ref 6.5–8.1)

## 2022-07-19 LAB — CBC WITH DIFFERENTIAL/PLATELET
Abs Immature Granulocytes: 0.03 10*3/uL (ref 0.00–0.07)
Basophils Absolute: 0 10*3/uL (ref 0.0–0.1)
Basophils Relative: 0 %
Eosinophils Absolute: 0.1 10*3/uL (ref 0.0–0.5)
Eosinophils Relative: 1 %
HCT: 37.4 % (ref 36.0–46.0)
Hemoglobin: 12.4 g/dL (ref 12.0–15.0)
Immature Granulocytes: 0 %
Lymphocytes Relative: 27 %
Lymphs Abs: 2.8 10*3/uL (ref 0.7–4.0)
MCH: 30.5 pg (ref 26.0–34.0)
MCHC: 33.2 g/dL (ref 30.0–36.0)
MCV: 92.1 fL (ref 80.0–100.0)
Monocytes Absolute: 0.5 10*3/uL (ref 0.1–1.0)
Monocytes Relative: 5 %
Neutro Abs: 7.2 10*3/uL (ref 1.7–7.7)
Neutrophils Relative %: 67 %
Platelets: 272 10*3/uL (ref 150–400)
RBC: 4.06 MIL/uL (ref 3.87–5.11)
RDW: 12.4 % (ref 11.5–15.5)
WBC: 10.7 10*3/uL — ABNORMAL HIGH (ref 4.0–10.5)
nRBC: 0 % (ref 0.0–0.2)

## 2022-07-19 LAB — URINALYSIS, ROUTINE W REFLEX MICROSCOPIC
Bilirubin Urine: NEGATIVE
Glucose, UA: NEGATIVE mg/dL
Hgb urine dipstick: NEGATIVE
Ketones, ur: 5 mg/dL — AB
Leukocytes,Ua: NEGATIVE
Nitrite: NEGATIVE
Protein, ur: NEGATIVE mg/dL
Specific Gravity, Urine: 1.028 (ref 1.005–1.030)
pH: 5 (ref 5.0–8.0)

## 2022-07-19 LAB — I-STAT BETA HCG BLOOD, ED (MC, WL, AP ONLY): I-stat hCG, quantitative: <5 m[IU]/mL (ref <5)

## 2022-07-19 LAB — LIPASE, BLOOD: Lipase: 34 U/L (ref 11–51)

## 2022-07-19 LAB — PREGNANCY, URINE: Preg Test, Ur: NEGATIVE — AB

## 2022-07-19 MED ORDER — ONDANSETRON HCL 4 MG/2ML IJ SOLN
4.0000 mg | Freq: Once | INTRAMUSCULAR | Status: AC
  Administered 2022-07-19: 4 mg via INTRAVENOUS
  Filled 2022-07-19: qty 2

## 2022-07-19 MED ORDER — KETOROLAC TROMETHAMINE 30 MG/ML IJ SOLN: 30.0000 mg | Freq: Once | INTRAMUSCULAR | Status: DC

## 2022-07-19 MED ORDER — KETOROLAC TROMETHAMINE 30 MG/ML IJ SOLN
30.0000 mg | Freq: Once | INTRAMUSCULAR | Status: AC
  Administered 2022-07-19: 30 mg via INTRAMUSCULAR
  Filled 2022-07-19: qty 1

## 2022-07-19 MED ORDER — PANTOPRAZOLE SODIUM 40 MG IV SOLR
40.0000 mg | Freq: Once | INTRAVENOUS | Status: AC
  Administered 2022-07-19: 40 mg via INTRAVENOUS
  Filled 2022-07-19: qty 10

## 2022-07-19 MED ORDER — ONDANSETRON 4 MG PO TBDP: 4.0000 mg | ORAL_TABLET | ORAL | 0 refills | Status: DC | PRN

## 2022-07-19 MED ORDER — ONDANSETRON HCL 4 MG PO TABS: 4.0000 mg | ORAL_TABLET | Freq: Four times a day (QID) | ORAL | 0 refills | Status: DC | PRN

## 2022-07-19 MED ORDER — DICYCLOMINE HCL 20 MG PO TABS: 20.0000 mg | ORAL_TABLET | Freq: Two times a day (BID) | ORAL | 0 refills | Status: DC | PRN

## 2022-07-19 MED ORDER — OMEPRAZOLE 20 MG PO CPDR: 20.0000 mg | DELAYED_RELEASE_CAPSULE | Freq: Every day | ORAL | 0 refills | Status: DC

## 2022-07-19 NOTE — ED Provider Triage Note (Signed)
Emergency Medicine Provider Triage Evaluation Note  Lorraine [name] , a 36 y.o. female  was evaluated in triage.  Pt complains of abd pain. Report 2 days of lower abdominal cramping, BRBPR, nausea, vomiting.  No fever, but does have chills, no dysuria, no rectal pain.  Hx of diverticulitis  Review of Systems  Positive: As above Negative: As above  Physical Exam  BP (!) 150/117   Pulse 84   Temp 98 F (36.7 C) (Oral)   Resp 18   LMP 06/07/2022   SpO2 100%  Gen:   Awake, no distress   Resp:  Normal effort  MSK:   Moves extremities without difficulty  Other:    Medical Decision Making  Medically screening exam initiated at 4:36 PM.  Appropriate orders placed.  Lyndell Perez-Matiano was informed that the remainder of the evaluation will be completed by another provider, this initial triage assessment does not replace that evaluation, and the importance of remaining in the ED until their evaluation is complete.     Domenic Moras, PA-C 07/19/22 1637

## 2022-07-19 NOTE — ED Provider Notes (Signed)
[facility] DEPT Provider Note   CSN: [id number] Arrival date & time: 07/18/22  1735     History Chief Complaint  Patient presents with   Abdominal Pain    Lorraine [name] is a 36 y.o. female with history of pancreatitis and cholecystectomy presents the emergency department for evaluation of abdominal pain with nausea and vomiting.  She reports that she had a chicken sandwich from a street vendor and 2 hours afterward started having some diffuse stomach cramping.  She reports that she had 5 episodes of nonbloody, nonbilious emesis yesterday.  Still endorses some nausea today.  She reports some abdominal pain diffusely, mainly in her left lower quadrant.  Denies any fevers.  Denies any dysuria or hematuria.  No medications tried prior to arrival.  Patient reports that she is unable to tolerate any food or fluids.  Interpreter service used during this encounter.   Abdominal Pain Associated symptoms: diarrhea, nausea and vomiting   Associated symptoms: no chest pain, no chills, no constipation, no dysuria, no fever, no hematuria and no shortness of breath        Home Medications Prior to Admission medications   Medication Sig Start Date End Date Taking? Authorizing Provider  ciprofloxacin (CIPRO) 500 MG tablet Take 1 tablet (500 mg total) by mouth every 12 (twelve) hours. 05/05/22   Lacretia Leigh, MD  dicyclomine (BENTYL) 20 MG tablet Take 1 tablet (20 mg total) by mouth 3 times/day as needed-between meals & bedtime for spasms. 05/01/22   Harriet Pho, PA-C  ibuprofen (ADVIL) 800 MG tablet Take 800 mg by mouth 3 (three) times daily. 04/21/22   [provider]  meclizine (ANTIVERT) 25 MG tablet Take 1 tablet (25 mg total) by mouth 3 (three) times daily as needed for dizziness. 05/07/22   Piontek, Junie Panning, MD  methocarbamol (ROBAXIN) 500 MG tablet Take 1 tablet (500 mg total) by mouth 2 (two) times daily. 05/31/22   Garald Balding, PA-C  naproxen  (NAPROSYN) 500 MG tablet Take 1 tablet (500 mg total) by mouth 2 (two) times daily. 05/31/22   Garald Balding, PA-C  naproxen (NAPROSYN) 500 MG tablet Take 1 tablet (500 mg total) by mouth 2 (two) times daily. 06/20/22   Maudie Flakes, MD  ondansetron (ZOFRAN) 4 MG tablet Take 1 tablet (4 mg total) by mouth every 6 (six) hours as needed for nausea or vomiting. 05/01/22   Harriet Pho, PA-C  potassium chloride SA (KLOR-CON) 20 MEQ tablet Take 1 tablet (20 mEq total) by mouth daily for 3 days. 06/14/21 06/17/21  Jeanell Sparrow, DO  promethazine (PHENERGAN) 25 MG tablet Take 1 tablet (25 mg total) by mouth every 6 (six) hours as needed for nausea or vomiting. 05/05/22   Lacretia Leigh, MD  cetirizine (ZYRTEC ALLERGY) 10 MG tablet Take 1 tablet (10 mg total) by mouth 2 (two) times daily. Patient not taking: Reported on 12/06/2020 11/12/20 01/24/21  Muthersbaugh, Jarrett Soho, PA-C      Allergies    Iodinated contrast media, Lupine bean extract, Ceftriaxone, and Morphine    Review of Systems   Review of Systems  Constitutional:  Negative for chills and fever.  Respiratory:  Negative for shortness of breath.   Cardiovascular:  Negative for chest pain.  Gastrointestinal:  Positive for abdominal pain, diarrhea, nausea and vomiting. Negative for blood in stool and constipation.  Genitourinary:  Negative for dysuria and hematuria.    Physical Exam Updated Vital Signs BP (!) 136/95 (BP Location:  Left Arm)   Pulse 83   Temp 98.2 F (36.8 C) (Oral)   Resp 16   LMP 06/07/2022   SpO2 97%  Physical Exam Vitals and nursing note reviewed.  Constitutional:      Appearance: Normal appearance.  HENT:     Head: Normocephalic and atraumatic.  Eyes:     General: No scleral icterus. Cardiovascular:     Rate and Rhythm: Normal rate and regular rhythm.  Pulmonary:     Effort: Pulmonary effort is normal. No respiratory distress.     Breath sounds: Normal breath sounds.  Abdominal:     General: Bowel sounds  are normal. There is no distension.     Palpations: Abdomen is soft.     Tenderness: There is generalized abdominal tenderness.     Comments: Abdomen soft, normal active bowel sounds.  Diffuse abdominal tenderness mainly over the left lower quadrant.  No guarding or rebound noted.  Musculoskeletal:        General: No deformity.     Cervical back: Normal range of motion.  Skin:    General: Skin is warm and dry.  Neurological:     General: No focal deficit present.     Mental Status: She is alert. Mental status is at baseline.     ED Results / Procedures / Treatments   Labs (all labs ordered are listed, but only abnormal results are displayed) Labs Reviewed  COMPREHENSIVE METABOLIC PANEL - Abnormal; Notable for the following components:      Result Value   Glucose, Bld 103 (*)    BUN 23 (*)    Anion gap 4 (*)    All other components within normal limits  CBC WITH DIFFERENTIAL/PLATELET - Abnormal; Notable for the following components:   Abs Immature Granulocytes 0.10 (*)    All other components within normal limits  URINALYSIS, ROUTINE W REFLEX MICROSCOPIC - Abnormal; Notable for the following components:   APPearance HAZY (*)    Ketones, ur 5 (*)    All other components within normal limits  PREGNANCY, URINE - Abnormal; Notable for the following components:   Preg Test, Ur NEGATIVE (*)    All other components within normal limits  LIPASE, BLOOD    EKG None  Radiology No results found.  Procedures Procedures   Medications Ordered in ED Medications  ondansetron (ZOFRAN-ODT) disintegrating tablet 4 mg (4 mg Oral Given 07/18/22 1848)  dicyclomine (BENTYL) capsule 10 mg (10 mg Oral Given 07/18/22 1849)    ED Course/ Medical Decision Making/ A&P                           Medical Decision Making Amount and/or Complexity of Data Reviewed Labs: ordered.  Risk Prescription drug management.   36 year old female presents emergency department for evaluation of abdominal  pain with nausea and vomiting.  Differential diagnosis includes was limited to gastroenteritis, pancreatitis, diverticulitis, dehydration, electrolyte abnormality.  Vital signs show elevated blood pressure 136/95 otherwise afebrile, normal pulse rate, satting well on room air without any increased work of breathing.  Physical exam as noted above.  Labs initiated in triage.  Patient was ordered Zofran and Bentyl.  I independently reviewed and interpreted the patient's labs.  CMP shows mild glucose at 103.  Mildly elevated BUN at 23 otherwise no electrolyte or LFT abnormality.  Lipase within normal limits.  Urinalysis shows hazy urine with 5 ketones otherwise unremarkable.  Pregnancy test is negative.  CBC without  leukocytosis or anemia.  Lab result looks interesting for pregnancy as it shows abnormal, but negative.  I did call laboratory to confirm the patient is in fact negative for pregnancy.  Will PO challenge. Patient was able to eat cracker and drink water without emesis.   I discussed with my attending about ordering CT imaging, he does not see any is needed at this time.  Her vital signs are stable, she does not have an elevated white blood cell count.  She is able to tolerate p.o. without any emesis.  No major electrolyte abnormality.  Some ketones in her urine however does not show any signs of dehydration.  Not tachycardic.  Her abdomen is soft, I do not think this is a surgical abdomen.  We will send this patient home with some Bentyl and Zofran for her nausea and diarrhea.  We discussed return precautions and red flag symptoms.  Patient verbalized understanding agrees the plan.  Patient is stable being discharged home in good condition.  Interpretor service used during this encounter.   I discussed this case with my attending physician who cosigned this note including patient's presenting symptoms, physical exam, and planned diagnostics and interventions. Attending physician stated agreement  with plan or made changes to plan which were implemented.    Final Clinical Impression(s) / ED Diagnoses Final diagnoses:  Left lower quadrant abdominal pain  Nausea vomiting and diarrhea    Rx / DC Orders ED Discharge Orders          Ordered    dicyclomine (BENTYL) 20 MG tablet  2 times daily between meals and at bedtime PRN        07/19/22 0325    ondansetron (ZOFRAN) 4 MG tablet  Every 6 hours PRN        07/19/22 0325              Sherrell Puller, PA-C 07/19/22 0417    Ripley Fraise, MD 07/19/22 [phone]

## 2022-07-19 NOTE — ED Notes (Signed)
Crackers and water given to pt.

## 2022-07-19 NOTE — Discharge Instructions (Signed)
1.  Starting taking omeprazole daily as prescribed. 2.  Read instructions about managing gastritis at home. 3.  Follow-up with your doctor soon as possible for recheck.

## 2022-07-19 NOTE — ED Notes (Addendum)
Pt resting in [place]. IV placed labs sent.

## 2022-07-19 NOTE — ED Triage Notes (Addendum)
Pt c/o abdominal pain, nausea, straining to have a bowel movement x2 days. Pt seen yesterday here at [facility] ED for similar complaints. Pt states she is also having some blood in her stool starting today. Pt states it was a little bit of blood x2 times today.

## 2022-07-19 NOTE — Discharge Instructions (Addendum)
You were seen in the emergency department for evaluation of your abdominal pain, nausea, vomiting, and diarrhea.  I do think you have some gastroenteritis from the food you had earlier.  I will prescribe you some Bentyl which is a antispasmodic to help you with your diarrhea.  I also prescribed you some Zofran.  Take as needed for your nausea.  Please make sure you are drinking plenty of fluids and staying well-hydrated.  If you have any worsening pain, fevers, vomiting any blood, black or bloody stools, please make sure to return to the nearest emergency room for evaluation.  If you have any other concerns, new or worsening symptoms, please return to the nearest emergency department for evaluation.  -------  Lo atendieron en el departamento de emergencias para evaluar su dolor abdominal, nuseas, vmitos y [place]. Creo que tienes algo de gastroenteritis por la comida que comiste antes. Le recetar Bentyl, que es un antiespasmdico para ayudarle con la diarrea. Tambin te recet Zofran. Tmelo segn sea necesario para las nuseas. Asegrese de beber muchos lquidos y [employer] bien hidratado. Si tiene [employer] [place], [place], vmitos con [place], heces negras o con [place], asegrese de regresar a la sala de emergencias ms cercana para una evaluacin. Si tiene [place] inquietud, sntomas nuevos o que [place], regrese al departamento de emergencias ms cercano para una evaluacin.  Comunquese con un mdico si: Sus sntomas empeoran. Aparecen nuevos sntomas. Tiene fiebre. No puede beber lquidos sin vomitar. Las nuseas no desaparecen despus de 2 das. Se siente aturdido o mareado. Tiene dolor de [place]. Tiene calambres musculares. Tiene una erupcin cutnea. Siente dolor al [employer]. Solicite ayuda de inmediato si: [employer], el cuello, los brazos o la [place]. Se siente muy dbil o se desmaya. Tiene vmitos persistentes. Vomita y el vmito es de color rojo intenso  o se asemeja al poso del caf. Tiene heces (deposiciones) negras o sanguinolentas, o heces de aspecto alquitranado. Siente dolor de cabeza intenso, rigidez en el cuello, o ambas cosas. Tiene dolor intenso, clicos o distensin abdominal. Tiene dificultades para respirar, o respira muy rpido. Su corazn late muy rpidamente. Siente la piel fra y hmeda. Se siente confundido. Tiene signos de deshidratacin, como los siguientes: [place] de color oscuro, muy escasa o falta de [place]. Labios agrietados. Sequedad de boca. Ojos hundidos. Somnolencia. Debilidad. Estos sntomas pueden Sales executive. Solicite ayuda de inmediato. Llame al 911. No espere a ver si los sntomas desaparecen. No conduzca por sus propios medios [employer].

## 2022-07-19 NOTE — ED Notes (Signed)
PT never ate crackers nor water stated to husband "NO". PT state she does not want it

## 2022-07-19 NOTE — ED Notes (Signed)
Pt ambulated from emergency dept . Discharge instructions reviewed.

## 2022-07-19 NOTE — ED Notes (Signed)
Pt ate one cracker and a few sips of water.

## 2022-07-19 NOTE — ED Provider Notes (Signed)
[facility] DEPT Provider Note   CSN: [id number] Arrival date & time: 07/19/22  1619     History  Chief Complaint  Patient presents with   Melena   Abdominal Pain    Lorraine [name] is a 36 y.o. female.  HPI Patient reports she is having abdominal pain.  Initially in triage it appeared to be lower abdominal pain.  In my evaluation she indicates pain is more central and slightly upper.  She also reports blood in the bowel movement that started within the past day.  Patient had been seen just yesterday after having eaten a chicken sandwich from a street vendor and then developing abdominal cramping and vomiting.  Still no fever.    Home Medications Prior to Admission medications   Medication Sig Start Date End Date Taking? Authorizing Provider  omeprazole (PRILOSEC) 20 MG capsule Take 1 capsule (20 mg total) by mouth daily. 07/19/22  Yes Charlesetta Shanks, MD  ondansetron (ZOFRAN-ODT) 4 MG disintegrating tablet Take 1 tablet (4 mg total) by mouth every 4 (four) hours as needed for nausea or vomiting. 07/19/22  Yes Charlesetta Shanks, MD  ciprofloxacin (CIPRO) 500 MG tablet Take 1 tablet (500 mg total) by mouth every 12 (twelve) hours. 05/05/22   Lacretia Leigh, MD  dicyclomine (BENTYL) 20 MG tablet Take 1 tablet (20 mg total) by mouth 3 times/day as needed-between meals & bedtime for spasms. 07/19/22   Sherrell Puller, PA-C  ibuprofen (ADVIL) 800 MG tablet Take 800 mg by mouth 3 (three) times daily. 04/21/22   [provider]  meclizine (ANTIVERT) 25 MG tablet Take 1 tablet (25 mg total) by mouth 3 (three) times daily as needed for dizziness. 05/07/22   Piontek, Junie Panning, MD  methocarbamol (ROBAXIN) 500 MG tablet Take 1 tablet (500 mg total) by mouth 2 (two) times daily. 05/31/22   Garald Balding, PA-C  naproxen (NAPROSYN) 500 MG tablet Take 1 tablet (500 mg total) by mouth 2 (two) times daily. 05/31/22   Garald Balding, PA-C  naproxen (NAPROSYN) 500 MG tablet  Take 1 tablet (500 mg total) by mouth 2 (two) times daily. 06/20/22   Maudie Flakes, MD  ondansetron (ZOFRAN) 4 MG tablet Take 1 tablet (4 mg total) by mouth every 6 (six) hours as needed for nausea or vomiting. 07/19/22   Sherrell Puller, PA-C  potassium chloride SA (KLOR-CON) 20 MEQ tablet Take 1 tablet (20 mEq total) by mouth daily for 3 days. 06/14/21 06/17/21  Jeanell Sparrow, DO  promethazine (PHENERGAN) 25 MG tablet Take 1 tablet (25 mg total) by mouth every 6 (six) hours as needed for nausea or vomiting. 05/05/22   Lacretia Leigh, MD  cetirizine (ZYRTEC ALLERGY) 10 MG tablet Take 1 tablet (10 mg total) by mouth 2 (two) times daily. Patient not taking: Reported on 12/06/2020 11/12/20 01/24/21  Muthersbaugh, Jarrett Soho, PA-C      Allergies    Iodinated contrast media, Lupine bean extract, Ceftriaxone, and Morphine    Review of Systems   Review of Systems  Physical Exam Updated Vital Signs BP (!) 148/93   Pulse 79   Temp 98.5 F (36.9 C) (Oral)   Resp 18   LMP 06/07/2022   SpO2 99%  Physical Exam Constitutional:      Comments: Alert nontoxic.  Clinically well in appearance.  HENT:     Mouth/Throat:     Pharynx: Oropharynx is clear.  Eyes:     Extraocular Movements: Extraocular movements intact.  Cardiovascular:  Rate and Rhythm: Normal rate and regular rhythm.  Pulmonary:     Effort: Pulmonary effort is normal.     Breath sounds: Normal breath sounds.  Abdominal:     Comments: Abdomen soft without guarding.  Patient endorses some central discomfort to palpation.  Musculoskeletal:        General: No swelling. Normal range of motion.     Right lower leg: No edema.     Left lower leg: No edema.  Skin:    General: Skin is warm and dry.  Neurological:     General: No focal deficit present.     Mental Status: She is oriented to person, place, and time.     Motor: No weakness.     Coordination: Coordination normal.  Psychiatric:        Mood and Affect: Mood normal.     ED  Results / Procedures / Treatments   Labs (all labs ordered are listed, but only abnormal results are displayed) Labs Reviewed  CBC WITH DIFFERENTIAL/PLATELET - Abnormal; Notable for the following components:      Result Value   WBC 10.7 (*)    All other components within normal limits  COMPREHENSIVE METABOLIC PANEL - Abnormal; Notable for the following components:   BUN 23 (*)    All other components within normal limits  LIPASE, BLOOD  URINALYSIS, ROUTINE W REFLEX MICROSCOPIC  I-STAT BETA HCG BLOOD, ED ([facility], [facility], [facility] ONLY)  POC OCCULT BLOOD, ED    EKG None  Radiology CT ABDOMEN PELVIS WO CONTRAST  Result Date: 07/19/2022 CLINICAL DATA:  Lower abdominal pain nausea vomiting EXAM: CT ABDOMEN AND PELVIS WITHOUT CONTRAST TECHNIQUE: Multidetector CT imaging of the abdomen and pelvis was performed following the standard protocol without IV contrast. RADIATION DOSE REDUCTION: This exam was performed according to the departmental dose-optimization program which includes automated exposure control, adjustment of the mA and/or kV according to patient size and/or use of iterative reconstruction technique. COMPARISON:  CT 06/19/2022, 04/29/2022 FINDINGS: Lower chest: No acute abnormality. Hepatobiliary: No focal liver abnormality is seen. Status post cholecystectomy. No biliary dilatation. Pancreas: Unremarkable. No pancreatic ductal dilatation or surrounding inflammatory changes. Spleen: Normal in size without focal abnormality. Adrenals/Urinary Tract: Adrenal glands are unremarkable. Kidneys are normal, without renal calculi, focal lesion, or hydronephrosis. Bladder is unremarkable. Stomach/Bowel: Stomach is within normal limits. Appendix appears normal. No evidence of bowel wall thickening, distention, or inflammatory changes. 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 Vascular/Lymphatic: No significant vascular findings are present. No enlarged abdominal or pelvic lymph nodes.  Reproductive: Uterus unremarkable. No suspicious adnexal mass. Dislodged tubal ligation clips within the inferior pelvis slightly to the left of midline, chronic finding. Other: No abdominal wall hernia or abnormality. No abdominopelvic ascites. Musculoskeletal: No acute or significant osseous findings. IMPRESSION: 1. No CT evidence for acute intra-abdominal or pelvic abnormality. 2. Diverticular disease without evidence for acute diverticulitis. Electronically Signed   By: Donavan Foil M.D.   On: 07/19/2022 17:16    Procedures Procedures    Medications Ordered in ED Medications  pantoprazole (PROTONIX) injection 40 mg (40 mg Intravenous Given 07/19/22 2027)  ondansetron (ZOFRAN) injection 4 mg (4 mg Intravenous Given 07/19/22 2028)    ED Course/ Medical Decision Making/ A&P                           Medical Decision Making Risk Prescription drug management.   Patient presents as outlined.  She  did have diagnostic evaluation earlier today.  Patient has suffered from frequent abdominal pain episodes.  At this time she is nontoxic.  Abdominal exam is nonsurgical.  With worsening symptoms CT scan was obtained after triage evaluation.  CT scan reviewed by radiology no acute findings.  Diverticular disease without acute diverticulitis.  Patient's white count 10.7, no fever, no anemia.  At this time no suspicion for acute diverticulitis needing antibiotics.  Hemoglobin stable, no sign of any significant blood loss.  Vital signs stable without hypotension or tachycardia.  At this time with patient clinically well in appearance and otherwise stable work-up, suspect possible gastritis with vomiting reported yesterday and ongoing discomfort.  Will recommend patient PPI with omeprazole and Zofran as needed for nausea.  No active vomiting in the emergency department.  At this time stable for discharge with outpatient follow-up.         Final Clinical Impression(s) / ED Diagnoses Final diagnoses:   Abdominal pain, unspecified abdominal location  Symptom of blood in stool    Rx / DC Orders ED Discharge Orders          Ordered    omeprazole (PRILOSEC) 20 MG capsule  Daily        07/19/22 2127    ondansetron (ZOFRAN-ODT) 4 MG disintegrating tablet  Every 4 hours PRN        07/19/22 2127              Charlesetta Shanks, MD 07/19/22 2134

## 2022-07-23 ENCOUNTER — Other Ambulatory Visit: Payer: Self-pay

## 2022-07-23 ENCOUNTER — Emergency Department (HOSPITAL_COMMUNITY)
Admission: EM | Admit: 2022-07-23 | Discharge: 2022-07-23 | Disposition: A | Discharge status: Home / Self Care | Payer: Self-pay | Attending: Emergency Medicine | Admitting: Emergency Medicine

## 2022-07-23 ENCOUNTER — Encounter (HOSPITAL_COMMUNITY): Payer: Self-pay | Admitting: Emergency Medicine

## 2022-07-23 DIAGNOSIS — D649 Anemia, unspecified: Secondary | ICD-10-CM | POA: Insufficient documentation

## 2022-07-23 DIAGNOSIS — K5790 Diverticulosis of intestine, part unspecified, without perforation or abscess without bleeding: Secondary | ICD-10-CM | POA: Insufficient documentation

## 2022-07-23 DIAGNOSIS — E119 Type 2 diabetes mellitus without complications: Secondary | ICD-10-CM | POA: Insufficient documentation

## 2022-07-23 DIAGNOSIS — R1084 Generalized abdominal pain: Secondary | ICD-10-CM

## 2022-07-23 DIAGNOSIS — J45909 Unspecified asthma, uncomplicated: Secondary | ICD-10-CM | POA: Insufficient documentation

## 2022-07-23 LAB — CBC WITH DIFFERENTIAL/PLATELET
Abs Immature Granulocytes: 0.03 10*3/uL (ref 0.00–0.07)
Basophils Absolute: 0 10*3/uL (ref 0.0–0.1)
Basophils Relative: 1 %
Eosinophils Absolute: 0.1 10*3/uL (ref 0.0–0.5)
Eosinophils Relative: 1 %
HCT: 35.3 % — ABNORMAL LOW (ref 36.0–46.0)
Hemoglobin: 11.8 g/dL — ABNORMAL LOW (ref 12.0–15.0)
Immature Granulocytes: 0 %
Lymphocytes Relative: 39 %
Lymphs Abs: 3.1 10*3/uL (ref 0.7–4.0)
MCH: 31.1 pg (ref 26.0–34.0)
MCHC: 33.4 g/dL (ref 30.0–36.0)
MCV: 92.9 fL (ref 80.0–100.0)
Monocytes Absolute: 0.5 10*3/uL (ref 0.1–1.0)
Monocytes Relative: 6 %
Neutro Abs: 4.2 10*3/uL (ref 1.7–7.7)
Neutrophils Relative %: 53 %
Platelets: 239 10*3/uL (ref 150–400)
RBC: 3.8 MIL/uL — ABNORMAL LOW (ref 3.87–5.11)
RDW: 12.4 % (ref 11.5–15.5)
WBC: 8 10*3/uL (ref 4.0–10.5)
nRBC: 0 % (ref 0.0–0.2)

## 2022-07-23 LAB — COMPREHENSIVE METABOLIC PANEL
ALT: 20 U/L (ref 0–44)
AST: 18 U/L (ref 15–41)
Albumin: 3.8 g/dL (ref 3.5–5.0)
Alkaline Phosphatase: 78 U/L (ref 38–126)
Anion gap: 3 — ABNORMAL LOW (ref 5–15)
BUN: 18 mg/dL (ref 6–20)
CO2: 26 mmol/L (ref 22–32)
Calcium: 8.8 mg/dL — ABNORMAL LOW (ref 8.9–10.3)
Chloride: 109 mmol/L (ref 98–111)
Creatinine, Ser: 0.76 mg/dL (ref 0.44–1.00)
GFR, Estimated: >60 mL/min (ref >60)
Glucose, Bld: 97 mg/dL (ref 70–99)
Potassium: 3.5 mmol/L (ref 3.5–5.1)
Sodium: 138 mmol/L (ref 135–145)
Total Bilirubin: 0.3 mg/dL (ref 0.3–1.2)
Total Protein: 6.8 g/dL (ref 6.5–8.1)

## 2022-07-23 LAB — URINALYSIS, ROUTINE W REFLEX MICROSCOPIC
Bilirubin Urine: NEGATIVE
Glucose, UA: NEGATIVE mg/dL
Hgb urine dipstick: NEGATIVE
Ketones, ur: NEGATIVE mg/dL
Leukocytes,Ua: NEGATIVE
Nitrite: NEGATIVE
Protein, ur: NEGATIVE mg/dL
Specific Gravity, Urine: 1.016 (ref 1.005–1.030)
pH: 6 (ref 5.0–8.0)

## 2022-07-23 LAB — I-STAT BETA HCG BLOOD, ED (MC, WL, AP ONLY): I-stat hCG, quantitative: <5 m[IU]/mL (ref <5)

## 2022-07-23 LAB — LIPASE, BLOOD: Lipase: 35 U/L (ref 11–51)

## 2022-07-23 MED ORDER — ONDANSETRON HCL 4 MG/2ML IJ SOLN
4.0000 mg | Freq: Once | INTRAMUSCULAR | Status: AC
  Administered 2022-07-23: 4 mg via INTRAVENOUS
  Filled 2022-07-23: qty 2

## 2022-07-23 MED ORDER — DICYCLOMINE HCL 10 MG PO CAPS
10.0000 mg | ORAL_CAPSULE | Freq: Once | ORAL | Status: AC
  Administered 2022-07-23: 10 mg via ORAL
  Filled 2022-07-23: qty 1

## 2022-07-23 MED ORDER — HYDROMORPHONE HCL 1 MG/ML IJ SOLN
0.5000 mg | Freq: Once | INTRAMUSCULAR | Status: AC
  Administered 2022-07-23: 0.5 mg via INTRAVENOUS
  Filled 2022-07-23: qty 1

## 2022-07-23 MED ORDER — SODIUM CHLORIDE 0.9 % IV BOLUS
1000.0000 mL | Freq: Once | INTRAVENOUS | Status: AC
  Administered 2022-07-23: 1000 mL via INTRAVENOUS

## 2022-07-23 NOTE — ED Provider Notes (Signed)
[facility] DEPT Provider Note   CSN: [id number] Arrival date & time: 07/23/22  0127     History  Chief Complaint  Patient presents with   Abdominal Pain    Lorraine [name] is a 36 y.o. female.  36 year old female with complaint of abdominal pain, onset 1 hour prior to arrival in the emergency room, located right side of abdomen, radiates towards left side of abdomen and back again.  Nothing makes her pain better or worse, has not taken anything for pain prior to arrival.  Reports associated nausea without vomiting.  Denies changes in bowel or bladder habits.  Prior history includes diverticulitis, pyelonephritis, asthma pancreatitis and diabetes.  A language interpreter was used (spanish).  Abdominal Pain      Home Medications Prior to Admission medications   Medication Sig Start Date End Date Taking? Authorizing Provider  ciprofloxacin (CIPRO) 500 MG tablet Take 1 tablet (500 mg total) by mouth every 12 (twelve) hours. 05/05/22   Lacretia Leigh, MD  dicyclomine (BENTYL) 20 MG tablet Take 1 tablet (20 mg total) by mouth 3 times/day as needed-between meals & bedtime for spasms. 07/19/22   Sherrell Puller, PA-C  ibuprofen (ADVIL) 800 MG tablet Take 800 mg by mouth 3 (three) times daily. 04/21/22   [provider]  meclizine (ANTIVERT) 25 MG tablet Take 1 tablet (25 mg total) by mouth 3 (three) times daily as needed for dizziness. 05/07/22   Piontek, Junie Panning, MD  methocarbamol (ROBAXIN) 500 MG tablet Take 1 tablet (500 mg total) by mouth 2 (two) times daily. 05/31/22   Garald Balding, PA-C  naproxen (NAPROSYN) 500 MG tablet Take 1 tablet (500 mg total) by mouth 2 (two) times daily. 05/31/22   Garald Balding, PA-C  naproxen (NAPROSYN) 500 MG tablet Take 1 tablet (500 mg total) by mouth 2 (two) times daily. 06/20/22   Maudie Flakes, MD  omeprazole (PRILOSEC) 20 MG capsule Take 1 capsule (20 mg total) by mouth daily. 07/19/22   Charlesetta Shanks, MD   ondansetron (ZOFRAN) 4 MG tablet Take 1 tablet (4 mg total) by mouth every 6 (six) hours as needed for nausea or vomiting. 07/19/22   Sherrell Puller, PA-C  ondansetron (ZOFRAN-ODT) 4 MG disintegrating tablet Take 1 tablet (4 mg total) by mouth every 4 (four) hours as needed for nausea or vomiting. 07/19/22   Charlesetta Shanks, MD  potassium chloride SA (KLOR-CON) 20 MEQ tablet Take 1 tablet (20 mEq total) by mouth daily for 3 days. 06/14/21 06/17/21  Jeanell Sparrow, DO  promethazine (PHENERGAN) 25 MG tablet Take 1 tablet (25 mg total) by mouth every 6 (six) hours as needed for nausea or vomiting. 05/05/22   Lacretia Leigh, MD  cetirizine (ZYRTEC ALLERGY) 10 MG tablet Take 1 tablet (10 mg total) by mouth 2 (two) times daily. Patient not taking: Reported on 12/06/2020 11/12/20 01/24/21  Muthersbaugh, Jarrett Soho, PA-C      Allergies    Iodinated contrast media, Lupine bean extract, Ceftriaxone, and Morphine    Review of Systems   Review of Systems  Gastrointestinal:  Positive for abdominal pain.  Negative except as per HPI   Physical Exam Updated Vital Signs BP 118/78   Pulse 71   Temp 98.9 F (37.2 C) (Oral)   Resp 18   Ht 5' (1.524 m)   Wt 78 kg   SpO2 99%   BMI 33.58 kg/m  Physical Exam Vitals and nursing note reviewed.  Constitutional:  General: She is not in acute distress.    Appearance: She is well-developed. She is not diaphoretic.  HENT:     Head: Normocephalic and atraumatic.  Cardiovascular:     Rate and Rhythm: Normal rate and regular rhythm.     Heart sounds: Normal heart sounds.  Pulmonary:     Effort: Pulmonary effort is normal.     Breath sounds: Normal breath sounds.  Abdominal:     Palpations: Abdomen is soft.     Tenderness: There is abdominal tenderness. There is no right CVA tenderness or left CVA tenderness.  Skin:    General: Skin is warm and dry.     Findings: No erythema or rash.  Neurological:     Mental Status: She is alert and oriented to person, place,  and time.  Psychiatric:        Behavior: Behavior normal.     ED Results / Procedures / Treatments   Labs (all labs ordered are listed, but only abnormal results are displayed) Labs Reviewed  CBC WITH DIFFERENTIAL/PLATELET - Abnormal; Notable for the following components:      Result Value   RBC 3.80 (*)    Hemoglobin 11.8 (*)    HCT 35.3 (*)    All other components within normal limits  COMPREHENSIVE METABOLIC PANEL - Abnormal; Notable for the following components:   Calcium 8.8 (*)    Anion gap 3 (*)    All other components within normal limits  LIPASE, BLOOD  URINALYSIS, ROUTINE W REFLEX MICROSCOPIC  I-STAT BETA HCG BLOOD, ED ([facility], [facility], [facility] ONLY)    EKG None  Radiology No results found.  Procedures Procedures    Medications Ordered in ED Medications  dicyclomine (BENTYL) capsule 10 mg (has no administration in time range)  sodium chloride 0.9 % bolus 1,000 mL (1,000 mLs Intravenous New Bag/Given 07/23/22 0357)  ondansetron (ZOFRAN) injection 4 mg (4 mg Intravenous Given 07/23/22 0355)  HYDROmorphone (DILAUDID) injection 0.5 mg (0.5 mg Intravenous Given 07/23/22 0357)    ED Course/ Medical Decision Making/ A&P                           Medical Decision Making Amount and/or Complexity of Data Reviewed Labs: ordered.  Risk Prescription drug management.   This patient presents to the ED for concern of abdominal pain, this involves an extensive number of treatment options, and is a complaint that carries with it a high risk of complications and morbidity.  The differential diagnosis includes but not limited toPeptic ulcer disease, GERD, appendicitis, kidney stone, urinary tract infection   Co morbidities that complicate the patient evaluation  Diabetes, diverticulitis, pyelonephritis, asthma, pancreatitis.  Prior abdominal surgery includes C-section and cholecystectomy   Additional history obtained:  External records from outside source obtained and reviewed  including recent imaging from 07/19/2022 CT abdomen pelvis without contrast showing diverticular disease without acute diverticulitis.   Lab Tests:  I Ordered, and personally interpreted labs.  The pertinent results include: CBC with mild anemia with hemoglobin 11.8, normal WBC.  CMP is unremarkable.  Lipase normal.  hCG negative.   Consultations Obtained:  I requested consultation with the ER attending Dr. Roxanne Mins,  and discussed lab and imaging findings as well as pertinent plan - they recommend: agree with plan of care   Problem List / ED Course / Critical interventions / Medication management  36 year old female with complaint of abdominal pain onset just prior to arrival in the  emergency room, diffuse in nature.  Interpreter is used for history and physical.  She is found to have generalized abdominal discomfort without guarding or rebound.  Negative psoas and operator signs.  Labs are reviewed and are reassuring pending her urinalysis.  Recent imaging with diverticular changes without diverticulitis.  She is afebrile with a normal white count today, do not suspect her pain is related to diverticulitis.  Multiple CT scans recently, with reassuring lab work, will hold off on further scan at this time.  Urinalysis is unremarkable.  Temperature is used for results and discharge planning.  Patient has had some relief of her pain, we will plan to give her Bentyl prior to discharge.  She last took this medication 2 days ago.  She has not been referred to GI as of yet, will provide referral today and encourage patient to schedule appointment. I ordered medication including Dilaudid, Zofran, IV fluids for pain Reevaluation of the patient after these medicines showed that the patient improved I have reviewed the patients home medicines and have made adjustments as needed   Social Determinants of Health:  Has PCP   Test / Admission - Considered:  Consider abdominal imaging with CT however patient  has had 5 CT scans of the abdomen pelvis this year with most recent being 4 days ago.  Imaging obtained 4 days ago shows diverticular disease without diverticulitis.  She is afebrile today with a normal white count.  Do not feel repeat CT scan is indicated at this time.          Final Clinical Impression(s) / ED Diagnoses Final diagnoses:  Generalized abdominal pain    Rx / DC Orders ED Discharge Orders     None         Roque Lias 09/81/19 1478    Delora Fuel, MD 29/56/21 [phone]

## 2022-07-23 NOTE — ED Notes (Signed)
Need urine   Tacy Learn, PA-C 07/23/22 [phone]

## 2022-07-23 NOTE — ED Triage Notes (Signed)
Pt c/o abdominal pain x 30 minutes.

## 2022-07-23 NOTE — ED Notes (Signed)
Pt unable to provide UA sample att. Will re-attempt later

## 2022-07-23 NOTE — Discharge Instructions (Signed)
Take Bentyl as prescribed previously in the ER for your abdominal pain.  Follow-up with your primary care provider.  Tome Bentyl como se le recet anteriormente en la sala de emergencias para su dolor abdominal. Haga un seguimiento con su proveedor de Midwife.

## 2022-08-11 ENCOUNTER — Emergency Department (HOSPITAL_COMMUNITY)
Admission: EM | Admit: 2022-08-11 | Discharge: 2022-08-11 | Discharge status: Against Medical Advice | Payer: Self-pay | Attending: Emergency Medicine | Admitting: Emergency Medicine

## 2022-08-11 ENCOUNTER — Other Ambulatory Visit: Payer: Self-pay

## 2022-08-11 ENCOUNTER — Encounter (HOSPITAL_COMMUNITY): Payer: Self-pay

## 2022-08-11 ENCOUNTER — Emergency Department (HOSPITAL_COMMUNITY): Payer: Self-pay

## 2022-08-11 DIAGNOSIS — R509 Fever, unspecified: Secondary | ICD-10-CM | POA: Insufficient documentation

## 2022-08-11 DIAGNOSIS — R0602 Shortness of breath: Secondary | ICD-10-CM | POA: Insufficient documentation

## 2022-08-11 DIAGNOSIS — J45909 Unspecified asthma, uncomplicated: Secondary | ICD-10-CM | POA: Insufficient documentation

## 2022-08-11 DIAGNOSIS — Z5321 Procedure and treatment not carried out due to patient leaving prior to being seen by health care provider: Secondary | ICD-10-CM | POA: Insufficient documentation

## 2022-08-11 DIAGNOSIS — Z1152 Encounter for screening for COVID-19: Secondary | ICD-10-CM | POA: Insufficient documentation

## 2022-08-11 DIAGNOSIS — R0789 Other chest pain: Secondary | ICD-10-CM | POA: Insufficient documentation

## 2022-08-11 LAB — CBC
HCT: 42.2 % (ref 36.0–46.0)
Hemoglobin: 14 g/dL (ref 12.0–15.0)
MCH: 30.6 pg (ref 26.0–34.0)
MCHC: 33.2 g/dL (ref 30.0–36.0)
MCV: 92.1 fL (ref 80.0–100.0)
Platelets: 322 10*3/uL (ref 150–400)
RBC: 4.58 MIL/uL (ref 3.87–5.11)
RDW: 12.5 % (ref 11.5–15.5)
WBC: 6.5 10*3/uL (ref 4.0–10.5)
nRBC: 0 % (ref 0.0–0.2)

## 2022-08-11 LAB — BASIC METABOLIC PANEL
Anion gap: 7 (ref 5–15)
BUN: 17 mg/dL (ref 6–20)
CO2: 26 mmol/L (ref 22–32)
Calcium: 9.3 mg/dL (ref 8.9–10.3)
Chloride: 104 mmol/L (ref 98–111)
Creatinine, Ser: 0.78 mg/dL (ref 0.44–1.00)
GFR, Estimated: >60 mL/min (ref >60)
Glucose, Bld: 93 mg/dL (ref 70–99)
Potassium: 3.7 mmol/L (ref 3.5–5.1)
Sodium: 137 mmol/L (ref 135–145)

## 2022-08-11 LAB — TROPONIN I (HIGH SENSITIVITY): Troponin I (High Sensitivity): 3 ng/L (ref <18)

## 2022-08-11 LAB — I-STAT BETA HCG BLOOD, ED (MC, WL, AP ONLY): I-stat hCG, quantitative: <5 m[IU]/mL (ref <5)

## 2022-08-11 LAB — RESP PANEL BY RT-PCR (FLU A&B, COVID) ARPGX2
Influenza A by PCR: NEGATIVE
Influenza B by PCR: NEGATIVE
SARS Coronavirus 2 by RT PCR: NEGATIVE

## 2022-08-11 NOTE — ED Provider Triage Note (Signed)
Emergency Medicine Provider Triage Evaluation Note  Lorraine [name] , a 36 y.o. female  was evaluated in triage.  Pt complains of chest tightness, shortness of breath.  Patient reports that the symptoms been ongoing for the last 4 days.  Patient states that she believed it was her asthma however she does not have her inhaler.  Patient also endorsing fevers.  Patient states she is having centralized chest tightness that does not radiate.  Patient denies sick contacts.  Patient denies nausea, vomiting.  Review of Systems  Positive:  Negative:   Physical Exam  BP (!) 110/98   Pulse 85   Temp 98 F (36.7 C) (Oral)   Resp 17   Ht 5' (1.524 m)   Wt 78.9 kg   LMP 08/04/2022 (Approximate)   SpO2 95%   BMI 33.98 kg/m  Gen:   Awake, no distress   Resp:  Normal effort  MSK:   Moves extremities without difficulty  Other:    Medical Decision Making  Medically screening exam initiated at 10:35 AM.  Appropriate orders placed.  [name] was informed that the remainder of the evaluation will be completed by another provider, this initial triage assessment does not replace that evaluation, and the importance of remaining in the ED until their evaluation is complete.     Azucena Cecil, PA-C 08/11/22 1035

## 2022-08-11 NOTE — ED Triage Notes (Signed)
Patient c/o mid chest pain that it feels like squeezing since this AM. Patient also c/o SOB.

## 2022-08-16 ENCOUNTER — Other Ambulatory Visit: Payer: Self-pay

## 2022-08-16 ENCOUNTER — Encounter (HOSPITAL_COMMUNITY): Payer: Self-pay

## 2022-08-16 ENCOUNTER — Emergency Department (HOSPITAL_COMMUNITY): Payer: Self-pay

## 2022-08-16 ENCOUNTER — Emergency Department (HOSPITAL_COMMUNITY)
Admission: EM | Admit: 2022-08-16 | Discharge: 2022-08-17 | Disposition: A | Discharge status: Home / Self Care | Payer: Self-pay | Attending: Emergency Medicine | Admitting: Emergency Medicine

## 2022-08-16 DIAGNOSIS — R0789 Other chest pain: Secondary | ICD-10-CM

## 2022-08-16 DIAGNOSIS — J45909 Unspecified asthma, uncomplicated: Secondary | ICD-10-CM | POA: Insufficient documentation

## 2022-08-16 DIAGNOSIS — Z20822 Contact with and (suspected) exposure to covid-19: Secondary | ICD-10-CM | POA: Insufficient documentation

## 2022-08-16 DIAGNOSIS — J209 Acute bronchitis, unspecified: Secondary | ICD-10-CM | POA: Insufficient documentation

## 2022-08-16 LAB — RESP PANEL BY RT-PCR (FLU A&B, COVID) ARPGX2
Influenza A by PCR: NEGATIVE
Influenza B by PCR: NEGATIVE
SARS Coronavirus 2 by RT PCR: NEGATIVE

## 2022-08-16 NOTE — ED Provider Triage Note (Signed)
Emergency Medicine Provider Triage Evaluation Note  Lorraine [name] , a 36 y.o. female  was evaluated in triage.  Pt complains of 3 days of a cough.  Occasionally productive of white phlegm.  Also says her breathing feels tight.  History of asthma and feels similar.  No recent travel, surgery, leg swelling or history of DVT/PE.  Review of Systems  Positive:  Negative:   Physical Exam  BP 117/72 (BP Location: Right Arm)   Pulse (!) 108   Temp 98.5 F (36.9 C) (Oral)   Resp 16   LMP 08/04/2022 (Approximate)   SpO2 100%  Gen:   Awake, no distress   Resp:  Normal effort  MSK:   Moves extremities without difficulty  Other:  Lung sounds clear, mildly tachycardic, regular rhythm  Medical Decision Making  Medically screening exam initiated at 10:06 PM.  Appropriate orders placed.  Lorraine [name] was informed that the remainder of the evaluation will be completed by another provider, this initial triage assessment does not replace that evaluation, and the importance of remaining in the ED until their evaluation is complete.     Lorraine [name], [place] 08/16/22 2207

## 2022-08-16 NOTE — ED Triage Notes (Signed)
Pt reports with cough and chest pain x 3 days. Pt states that she is coughing up white mucus. Pt has a hx of asthma.

## 2022-08-17 MED ORDER — HYDROCOD POLI-CHLORPHE POLI ER 10-8 MG/5ML PO SUER: 5.0000 mL | Freq: Two times a day (BID) | ORAL | 0 refills | Status: DC | PRN

## 2022-08-17 MED ORDER — ALBUTEROL SULFATE HFA 108 (90 BASE) MCG/ACT IN AERS
2.0000 | INHALATION_SPRAY | RESPIRATORY_TRACT | Status: DC | PRN
  Administered 2022-08-17: 2 via RESPIRATORY_TRACT
  Filled 2022-08-17: qty 6.7

## 2022-08-17 MED ORDER — NAPROXEN 500 MG PO TABS
500.0000 mg | ORAL_TABLET | Freq: Once | ORAL | Status: AC
  Administered 2022-08-17: 500 mg via ORAL
  Filled 2022-08-17: qty 1

## 2022-08-17 MED ORDER — HYDROCOD POLI-CHLORPHE POLI ER 10-8 MG/5ML PO SUER
5.0000 mL | Freq: Once | ORAL | Status: AC
  Administered 2022-08-17: 5 mL via ORAL
  Filled 2022-08-17: qty 5

## 2022-08-17 MED ORDER — AEROCHAMBER PLUS FLO-VU MISC
1.0000 | Freq: Once | Status: AC
  Administered 2022-08-17: 1
  Filled 2022-08-17: qty 1

## 2022-08-17 MED ORDER — NAPROXEN 500 MG PO TABS: ORAL_TABLET | ORAL | 0 refills | Status: DC

## 2022-08-17 NOTE — ED Provider Notes (Signed)
[facility] DEPT Provider Note: Georgena Spurling, MD, FACEP  CSN: [id number] MRN: [id number] ARRIVAL: 08/16/22 at 2132 ROOM: WA12/WA12   CHIEF COMPLAINT  Cough and Chest Pain   HISTORY OF PRESENT ILLNESS  08/17/22 4:11 AM Lorraine [name] is a 36 y.o. female with a history of asthma.  She was seen at [facility] on 08/15/2022 for 1 week of sore throat, cough and shortness of breath.  She was out of her asthma medications at that time.  She was diagnosed with pneumonia and given a prescription for Zithromax.  She was not given a new inhaler.  She is here today complaining of a persistent cough productive of white mucus with pain in her chest, worse with coughing.  The pain is located to her mid upper chest and is aching in nature.  She rates it as a 6 out of 10.    Past Medical History:  Diagnosis Date   Angio-edema    Asthma    Inhaler used 01/02/16   Diverticulitis    Nausea & vomiting 04/02/2017   Pancreatitis    Pre-diabetes    Pyelonephritis    Urticaria     Past Surgical History:  Procedure Laterality Date   CESAREAN SECTION     CESAREAN SECTION N/A 2006   CESAREAN SECTION N/A 09/03/2014   Procedure: CESAREAN SECTION;  Surgeon: Guss Bunde, MD;  Location: [facility];  Service: Obstetrics;  Laterality: N/A;   CHOLECYSTECTOMY     ESOPHAGOGASTRODUODENOSCOPY (EGD) WITH PROPOFOL Left 04/07/2017   Procedure: ESOPHAGOGASTRODUODENOSCOPY (EGD) WITH PROPOFOL;  Surgeon: Ronnette Juniper, MD;  Location: [facility];  Service: Gastroenterology;  Laterality: Left;   TUBAL LIGATION      Family History  Problem Relation Age of Onset   Hyperlipidemia Mother    Diabetes Father    Ulcers Father    Diabetes Maternal Uncle    Diabetes Paternal Grandmother    Asthma Daughter    Allergic rhinitis Neg Hx    Angioedema Neg Hx    Eczema Neg Hx    Immunodeficiency Neg Hx    Urticaria Neg Hx     Social History   Tobacco Use   Smoking status: Never   Smokeless tobacco: Never   Vaping Use   Vaping Use: Never used  Substance Use Topics   Alcohol use: No   Drug use: No    Prior to Admission medications   Medication Sig Start Date End Date Taking? Authorizing Provider  ciprofloxacin (CIPRO) 500 MG tablet Take 1 tablet (500 mg total) by mouth every 12 (twelve) hours. 05/05/22   Lacretia Leigh, MD  dicyclomine (BENTYL) 20 MG tablet Take 1 tablet (20 mg total) by mouth 3 times/day as needed-between meals & bedtime for spasms. 07/19/22   Sherrell Puller, PA-C  ibuprofen (ADVIL) 800 MG tablet Take 800 mg by mouth 3 (three) times daily. 04/21/22   [provider]  meclizine (ANTIVERT) 25 MG tablet Take 1 tablet (25 mg total) by mouth 3 (three) times daily as needed for dizziness. 05/07/22   Piontek, Junie Panning, MD  methocarbamol (ROBAXIN) 500 MG tablet Take 1 tablet (500 mg total) by mouth 2 (two) times daily. 05/31/22   Garald Balding, PA-C  naproxen (NAPROSYN) 500 MG tablet Take 1 tablet (500 mg total) by mouth 2 (two) times daily. 05/31/22   Garald Balding, PA-C  naproxen (NAPROSYN) 500 MG tablet Take 1 tablet (500 mg total) by mouth 2 (two) times daily. 06/20/22   Maudie Flakes, MD  omeprazole (PRILOSEC) 20 MG capsule Take 1 capsule (20 mg total) by mouth daily. 07/19/22   Charlesetta Shanks, MD  ondansetron (ZOFRAN) 4 MG tablet Take 1 tablet (4 mg total) by mouth every 6 (six) hours as needed for nausea or vomiting. 07/19/22   Sherrell Puller, PA-C  ondansetron (ZOFRAN-ODT) 4 MG disintegrating tablet Take 1 tablet (4 mg total) by mouth every 4 (four) hours as needed for nausea or vomiting. 07/19/22   Charlesetta Shanks, MD  potassium chloride SA (KLOR-CON) 20 MEQ tablet Take 1 tablet (20 mEq total) by mouth daily for 3 days. 06/14/21 06/17/21  Jeanell Sparrow, DO  promethazine (PHENERGAN) 25 MG tablet Take 1 tablet (25 mg total) by mouth every 6 (six) hours as needed for nausea or vomiting. 05/05/22   Lacretia Leigh, MD  cetirizine (ZYRTEC ALLERGY) 10 MG tablet Take 1 tablet (10 mg total)  by mouth 2 (two) times daily. Patient not taking: Reported on 12/06/2020 11/12/20 01/24/21  Muthersbaugh, Jarrett Soho, PA-C    Allergies Iodinated contrast media, Lupine bean extract, Ceftriaxone, and Morphine   REVIEW OF SYSTEMS  Negative except as noted here or in the History of Present Illness.   PHYSICAL EXAMINATION  Initial Vital Signs Blood pressure 117/72, pulse (!) 108, temperature 98.5 F (36.9 C), temperature source Oral, resp. rate 16, last menstrual period 08/04/2022, SpO2 100 %.  Examination General: Well-developed, well-nourished female in no acute distress; appearance consistent with age of record HENT: normocephalic; atraumatic Eyes: Normal appearance Neck: supple Heart: regular rate and rhythm Lungs: Shallow breaths; persistent cough Abdomen: soft; nondistended; nontender; bowel sounds present Extremities: No deformity; full range of motion; pulses normal Neurologic: Awake, alert and oriented; motor function intact in all extremities and symmetric; no facial droop Skin: Warm and dry Psychiatric: Normal mood and affect   RESULTS  Summary of this visit's results, reviewed and interpreted by myself:   EKG Interpretation  Date/Time:    Ventricular Rate:    PR Interval:    QRS Duration:   QT Interval:    QTC Calculation:   R Axis:     Text Interpretation:         Laboratory Studies: Results for orders placed or performed during the hospital encounter of 08/16/22 (from the past 24 hour(s))  Resp Panel by RT-PCR (Flu A&B, Covid) Anterior Nasal Swab     Status: None   Collection Time: 08/16/22 10:01 PM   Specimen: Anterior Nasal Swab  Result Value Ref Range   SARS Coronavirus 2 by RT PCR NEGATIVE NEGATIVE   Influenza A by PCR NEGATIVE NEGATIVE   Influenza B by PCR NEGATIVE NEGATIVE   Imaging Studies: DG Chest 2 View  Result Date: 08/16/2022 CLINICAL DATA:  sob EXAM: CHEST - 2 VIEW COMPARISON:  Chest x-ray 08/11/2022 FINDINGS: The heart and mediastinal  contours are within normal limits. No focal consolidation. No pulmonary edema. No pleural effusion. No pneumothorax. No acute osseous abnormality. IMPRESSION: No active cardiopulmonary disease. Electronically Signed   By: Iven Finn M.D.   On: 08/16/2022 22:33    ED COURSE and MDM  Nursing notes, initial and subsequent vitals signs, including pulse oximetry, reviewed and interpreted by myself.  Vitals:   08/16/22 2154  BP: 117/72  Pulse: (!) 108  Resp: 16  Temp: 98.5 F (36.9 C)  TempSrc: Oral  SpO2: 100%   Medications  aerochamber plus with mask device 1 each (has no administration in time range)  albuterol (VENTOLIN HFA) 108 (90 Base) MCG/ACT inhaler 2 puff (  has no administration in time range)  naproxen (NAPROSYN) tablet 500 mg (has no administration in time range)  chlorpheniramine-HYDROcodone (TUSSIONEX) 10-8 MG/5ML suspension 5 mL (has no administration in time range)   The patient is already on an antibiotic.  We will refill her albuterol since she is out.  We will treat her chest wall pain with naproxen and her cough with Tussionex.  No evidence of ongoing pneumonia on chest x-ray but she should continue taking her Zithromax.  She is in no distress on exam.   PROCEDURES  Procedures   ED DIAGNOSES     ICD-10-CM   1. Acute bronchitis with bronchospasm  J20.9     2. Chest wall pain  R07.89          Shanon Rosser, MD 08/17/22 [phone]

## 2022-08-25 ENCOUNTER — Other Ambulatory Visit: Payer: Self-pay

## 2022-08-25 ENCOUNTER — Emergency Department (HOSPITAL_COMMUNITY): Payer: Self-pay

## 2022-08-25 ENCOUNTER — Emergency Department (HOSPITAL_COMMUNITY)
Admission: EM | Admit: 2022-08-25 | Discharge: 2022-08-25 | Disposition: A | Discharge status: Home / Self Care | Payer: Self-pay | Attending: Emergency Medicine | Admitting: Emergency Medicine

## 2022-08-25 ENCOUNTER — Encounter (HOSPITAL_COMMUNITY): Payer: Self-pay

## 2022-08-25 DIAGNOSIS — J9801 Acute bronchospasm: Secondary | ICD-10-CM | POA: Insufficient documentation

## 2022-08-25 DIAGNOSIS — Z1152 Encounter for screening for COVID-19: Secondary | ICD-10-CM | POA: Insufficient documentation

## 2022-08-25 LAB — CBC WITH DIFFERENTIAL/PLATELET
Abs Immature Granulocytes: 0.02 10*3/uL (ref 0.00–0.07)
Basophils Absolute: 0.1 10*3/uL (ref 0.0–0.1)
Basophils Relative: 1 %
Eosinophils Absolute: 0.3 10*3/uL (ref 0.0–0.5)
Eosinophils Relative: 3 %
HCT: 36.2 % (ref 36.0–46.0)
Hemoglobin: 11.8 g/dL — ABNORMAL LOW (ref 12.0–15.0)
Immature Granulocytes: 0 %
Lymphocytes Relative: 42 %
Lymphs Abs: 4.1 10*3/uL — ABNORMAL HIGH (ref 0.7–4.0)
MCH: 30.1 pg (ref 26.0–34.0)
MCHC: 32.6 g/dL (ref 30.0–36.0)
MCV: 92.3 fL (ref 80.0–100.0)
Monocytes Absolute: 0.4 10*3/uL (ref 0.1–1.0)
Monocytes Relative: 4 %
Neutro Abs: 4.7 10*3/uL (ref 1.7–7.7)
Neutrophils Relative %: 50 %
Platelets: 256 10*3/uL (ref 150–400)
RBC: 3.92 MIL/uL (ref 3.87–5.11)
RDW: 12.9 % (ref 11.5–15.5)
WBC: 9.6 10*3/uL (ref 4.0–10.5)
nRBC: 0 % (ref 0.0–0.2)

## 2022-08-25 LAB — BASIC METABOLIC PANEL
Anion gap: 9 (ref 5–15)
BUN: 19 mg/dL (ref 6–20)
CO2: 19 mmol/L — ABNORMAL LOW (ref 22–32)
Calcium: 8.9 mg/dL (ref 8.9–10.3)
Chloride: 111 mmol/L (ref 98–111)
Creatinine, Ser: 0.74 mg/dL (ref 0.44–1.00)
GFR, Estimated: >60 mL/min (ref >60)
Glucose, Bld: 112 mg/dL — ABNORMAL HIGH (ref 70–99)
Potassium: 3.2 mmol/L — ABNORMAL LOW (ref 3.5–5.1)
Sodium: 139 mmol/L (ref 135–145)

## 2022-08-25 LAB — RESP PANEL BY RT-PCR (RSV, FLU A&B, COVID)  RVPGX2
Influenza A by PCR: NEGATIVE
Influenza B by PCR: NEGATIVE
Resp Syncytial Virus by PCR: NEGATIVE
SARS Coronavirus 2 by RT PCR: NEGATIVE

## 2022-08-25 LAB — TROPONIN I (HIGH SENSITIVITY): Troponin I (High Sensitivity): <2 ng/L (ref <18)

## 2022-08-25 MED ORDER — ALBUTEROL SULFATE HFA 108 (90 BASE) MCG/ACT IN AERS: 2.0000 | INHALATION_SPRAY | RESPIRATORY_TRACT | 1 refills | Status: AC | PRN

## 2022-08-25 MED ORDER — PREDNISONE 20 MG PO TABS
40.0000 mg | ORAL_TABLET | Freq: Once | ORAL | Status: AC
  Administered 2022-08-25: 40 mg via ORAL
  Filled 2022-08-25: qty 2

## 2022-08-25 MED ORDER — ALBUTEROL SULFATE (2.5 MG/3ML) 0.083% IN NEBU
2.5000 mg | INHALATION_SOLUTION | Freq: Once | RESPIRATORY_TRACT | Status: AC
  Administered 2022-08-25: 2.5 mg via RESPIRATORY_TRACT
  Filled 2022-08-25: qty 3

## 2022-08-25 MED ORDER — PREDNISONE 10 MG PO TABS: 20.0000 mg | ORAL_TABLET | Freq: Every day | ORAL | 0 refills | Status: DC

## 2022-08-25 MED ORDER — IPRATROPIUM-ALBUTEROL 0.5-2.5 (3) MG/3ML IN SOLN
3.0000 mL | Freq: Once | RESPIRATORY_TRACT | Status: AC
  Administered 2022-08-25: 3 mL via RESPIRATORY_TRACT
  Filled 2022-08-25: qty 3

## 2022-08-25 NOTE — ED Triage Notes (Signed)
Pt reports with cough and chest pain x 2 weeks.

## 2022-08-25 NOTE — ED Provider Notes (Signed)
[facility] DEPT Provider Note   CSN: [id number] Arrival date & time: 08/25/22  0222     History {Add pertinent medical, surgical, social history, OB history to HPI:1} Chief Complaint  Patient presents with   Cough   Chest Pain    Lorraine [name] is a 36 y.o. female.  Patient has a history of asthma.  She complains of cough with mild shortness of breath   Cough Associated symptoms: chest pain   Chest Pain Associated symptoms: cough        Home Medications Prior to Admission medications   Medication Sig Start Date End Date Taking? Authorizing Provider  albuterol (VENTOLIN HFA) 108 (90 Base) MCG/ACT inhaler Inhale 2 puffs into the lungs every 4 (four) hours as needed for wheezing or shortness of breath. 08/25/22  Yes Milton Ferguson, MD  predniSONE (DELTASONE) 10 MG tablet Take 2 tablets (20 mg total) by mouth daily. 08/25/22  Yes Milton Ferguson, MD  chlorpheniramine-HYDROcodone (TUSSIONEX) 10-8 MG/5ML Take 5 mLs by mouth every 12 (twelve) hours as needed for cough. 08/17/22   Molpus, John, MD  dicyclomine (BENTYL) 20 MG tablet Take 1 tablet (20 mg total) by mouth 3 times/day as needed-between meals & bedtime for spasms. 07/19/22   Sherrell Puller, PA-C  meclizine (ANTIVERT) 25 MG tablet Take 1 tablet (25 mg total) by mouth 3 (three) times daily as needed for dizziness. 05/07/22   Piontek, Junie Panning, MD  naproxen (NAPROSYN) 500 MG tablet Take 1 tablet twice daily as needed for chest wall pain. 08/17/22   Molpus, John, MD  omeprazole (PRILOSEC) 20 MG capsule Take 1 capsule (20 mg total) by mouth daily. 07/19/22   Charlesetta Shanks, MD  ondansetron (ZOFRAN) 4 MG tablet Take 1 tablet (4 mg total) by mouth every 6 (six) hours as needed for nausea or vomiting. 07/19/22   Sherrell Puller, PA-C  ondansetron (ZOFRAN-ODT) 4 MG disintegrating tablet Take 1 tablet (4 mg total) by mouth every 4 (four) hours as needed for nausea or vomiting. 07/19/22   Charlesetta Shanks, MD   promethazine (PHENERGAN) 25 MG tablet Take 1 tablet (25 mg total) by mouth every 6 (six) hours as needed for nausea or vomiting. 05/05/22   Lacretia Leigh, MD  cetirizine (ZYRTEC ALLERGY) 10 MG tablet Take 1 tablet (10 mg total) by mouth 2 (two) times daily. Patient not taking: Reported on 12/06/2020 11/12/20 01/24/21  Muthersbaugh, Jarrett Soho, PA-C      Allergies    Iodinated contrast media, Lupine bean extract, Ceftriaxone, and Morphine    Review of Systems   Review of Systems  Respiratory:  Positive for cough.   Cardiovascular:  Positive for chest pain.    Physical Exam Updated Vital Signs BP 125/80 (BP Location: Left Arm)   Pulse 68   Temp 98.7 F (37.1 C) (Oral)   Resp 18   Ht 5' (1.524 m)   Wt 79.4 kg   LMP 08/04/2022 (Approximate)   SpO2 99%   BMI 34.18 kg/m  Physical Exam  ED Results / Procedures / Treatments   Labs (all labs ordered are listed, but only abnormal results are displayed) Labs Reviewed  CBC WITH DIFFERENTIAL/PLATELET - Abnormal; Notable for the following components:      Result Value   Hemoglobin 11.8 (*)    Lymphs Abs 4.1 (*)    All other components within normal limits  BASIC METABOLIC PANEL - Abnormal; Notable for the following components:   Potassium 3.2 (*)    CO2 19 (*)  Glucose, Bld 112 (*)    All other components within normal limits  RESP PANEL BY RT-PCR (RSV, FLU A&B, COVID)  RVPGX2  TROPONIN I (HIGH SENSITIVITY)    EKG EKG Interpretation  Date/Time:  Saturday August 25 2022 03:13:42 EST Ventricular Rate:  99 PR Interval:  139 QRS Duration: 100 QT Interval:  350 QTC Calculation: 450 R Axis:   56 Text Interpretation: Sinus rhythm Low voltage, precordial leads Confirmed by Milton Ferguson [phone]) on 08/25/2022 9:22:42 AM  Radiology DG Chest 2 View  Result Date: 08/25/2022 CLINICAL DATA:  Chest pain. EXAM: CHEST - 2 VIEW COMPARISON:  08/16/2022. FINDINGS: The heart size and mediastinal contours are within normal limits. No  consolidation, effusion, or pneumothorax. No acute osseous abnormality. Surgical clips are present in the right upper quadrant. IMPRESSION: No active cardiopulmonary disease. Electronically Signed   By: Brett Fairy M.D.   On: 08/25/2022 03:03    Procedures Procedures  {Document cardiac monitor, telemetry assessment procedure when appropriate:1}  Medications Ordered in ED Medications  ipratropium-albuterol (DUONEB) 0.5-2.5 (3) MG/3ML nebulizer solution 3 mL (3 mLs Nebulization Given 08/25/22 0953)  albuterol (PROVENTIL) (2.5 MG/3ML) 0.083% nebulizer solution 2.5 mg (2.5 mg Nebulization Given 08/25/22 0953)  predniSONE (DELTASONE) tablet 40 mg (40 mg Oral Given 08/25/22 4967)    ED Course/ Medical Decision Making/ A&P                           Medical Decision Making Risk Prescription drug management.   Patient with bronchospasm.  She improved with neb treatment.  She is given prednisone and albuterol and will follow-up with PCP  {Document critical care time when appropriate:1} {Document review of labs and clinical decision tools ie heart score, Chads2Vasc2 etc:1}  {Document your independent review of radiology images, and any outside records:1} {Document your discussion with family members, caretakers, and with consultants:1} {Document social determinants of health affecting pt's care:1} {Document your decision making why or why not admission, treatments were needed:1} Final Clinical Impression(s) / ED Diagnoses Final diagnoses:  Bronchospasm    Rx / DC Orders ED Discharge Orders          Ordered    predniSONE (DELTASONE) 10 MG tablet  Daily        08/25/22 1030    albuterol (VENTOLIN HFA) 108 (90 Base) MCG/ACT inhaler  Every 4 hours PRN        08/25/22 1030

## 2022-08-25 NOTE — Discharge Instructions (Signed)
Follow-up with you for any doctor in a couple weeks for recheck

## 2022-08-25 NOTE — ED Provider Triage Note (Signed)
Emergency Medicine Provider Triage Evaluation Note  Lorraine [name] , a 36 y.o. female  was evaluated in triage.  Pt complains of central, nonradiating chest pain which began at 2100. Pain has been constant. Has tried an inhaler w/o relief. No fever, hemoptysis, vomiting. Diagnosed with PNA on 08/15/22 and prescribed a Z-pack.  Review of Systems  Positive: As above Negative: As above  Physical Exam  LMP 08/04/2022 (Approximate)  Gen:   Awake, no distress   Resp:  Normal effort  MSK:   Moves extremities without difficulty  Other:  Lungs grossly CTAB.  Medical Decision Making  Medically screening exam initiated at 2:41 AM.  Appropriate orders placed.  Lorraine [name] was informed that the remainder of the evaluation will be completed by another provider, this initial triage assessment does not replace that evaluation, and the importance of remaining in the ED until their evaluation is complete.  Nonspecific chest pain - work up initiated   Antonietta Breach, [place] 08/25/22 [place]

## 2022-10-15 ENCOUNTER — Other Ambulatory Visit: Payer: Self-pay

## 2022-10-15 ENCOUNTER — Emergency Department (HOSPITAL_COMMUNITY)
Admission: EM | Admit: 2022-10-15 | Discharge: 2022-10-16 | Discharge status: Against Medical Advice | Payer: Self-pay | Attending: Emergency Medicine | Admitting: Emergency Medicine

## 2022-10-15 DIAGNOSIS — R110 Nausea: Secondary | ICD-10-CM | POA: Insufficient documentation

## 2022-10-15 DIAGNOSIS — M545 Low back pain, unspecified: Secondary | ICD-10-CM | POA: Insufficient documentation

## 2022-10-15 DIAGNOSIS — Z5321 Procedure and treatment not carried out due to patient leaving prior to being seen by health care provider: Secondary | ICD-10-CM | POA: Insufficient documentation

## 2022-10-15 NOTE — ED Provider Triage Note (Signed)
Emergency Medicine Provider Triage Evaluation Note  Lorraine [name] , a 37 y.o. female  was evaluated in triage.  Pt complains of lower back pain.  History of the same as well as urine infection.  She has had nausea without vomiting.  No fevers.  Review of Systems  Positive: Flank pain Negative: Fever  Physical Exam  BP (!) 144/98 (BP Location: Left Arm)   Pulse 78   Temp 98.4 F (36.9 C) (Oral)   Resp 17   SpO2 100%  Gen:   Awake, no distress   Resp:  Normal effort  MSK:   Moves extremities without difficulty  Other:    Medical Decision Making  Medically screening exam initiated at 6:43 PM.  Appropriate orders placed.  Lorraine [name] was informed that the remainder of the evaluation will be completed by another provider, this initial triage assessment does not replace that evaluation, and the importance of remaining in the ED until their evaluation is complete.     Carlisle Cater, PA-C 10/15/22 1845

## 2022-10-15 NOTE — ED Triage Notes (Signed)
Pt c/o bi-lateral flank pain since yesterday with nausea.

## 2022-11-08 ENCOUNTER — Other Ambulatory Visit: Payer: Self-pay

## 2022-11-08 ENCOUNTER — Emergency Department (HOSPITAL_COMMUNITY)
Admission: EM | Admit: 2022-11-08 | Discharge: 2022-11-09 | Disposition: A | Discharge status: Home / Self Care | Payer: Self-pay | Attending: Emergency Medicine | Admitting: Emergency Medicine

## 2022-11-08 ENCOUNTER — Emergency Department (HOSPITAL_COMMUNITY): Payer: Self-pay

## 2022-11-08 DIAGNOSIS — J45909 Unspecified asthma, uncomplicated: Secondary | ICD-10-CM | POA: Insufficient documentation

## 2022-11-08 DIAGNOSIS — R1011 Right upper quadrant pain: Secondary | ICD-10-CM | POA: Insufficient documentation

## 2022-11-08 DIAGNOSIS — R110 Nausea: Secondary | ICD-10-CM | POA: Insufficient documentation

## 2022-11-08 LAB — COMPREHENSIVE METABOLIC PANEL
ALT: 17 U/L (ref 0–44)
AST: 22 U/L (ref 15–41)
Albumin: 4.3 g/dL (ref 3.5–5.0)
Alkaline Phosphatase: 93 U/L (ref 38–126)
Anion gap: 9 (ref 5–15)
BUN: 18 mg/dL (ref 6–20)
CO2: 26 mmol/L (ref 22–32)
Calcium: 9.1 mg/dL (ref 8.9–10.3)
Chloride: 105 mmol/L (ref 98–111)
Creatinine, Ser: 0.83 mg/dL (ref 0.44–1.00)
GFR, Estimated: >60 mL/min (ref >60)
Glucose, Bld: 103 mg/dL — ABNORMAL HIGH (ref 70–99)
Potassium: 3.4 mmol/L — ABNORMAL LOW (ref 3.5–5.1)
Sodium: 140 mmol/L (ref 135–145)
Total Bilirubin: 0.2 mg/dL — ABNORMAL LOW (ref 0.3–1.2)
Total Protein: 7.4 g/dL (ref 6.5–8.1)

## 2022-11-08 LAB — CBC
HCT: 38.8 % (ref 36.0–46.0)
Hemoglobin: 12.9 g/dL (ref 12.0–15.0)
MCH: 30.4 pg (ref 26.0–34.0)
MCHC: 33.2 g/dL (ref 30.0–36.0)
MCV: 91.3 fL (ref 80.0–100.0)
Platelets: 267 10*3/uL (ref 150–400)
RBC: 4.25 MIL/uL (ref 3.87–5.11)
RDW: 12.2 % (ref 11.5–15.5)
WBC: 6.9 10*3/uL (ref 4.0–10.5)
nRBC: 0 % (ref 0.0–0.2)

## 2022-11-08 LAB — I-STAT BETA HCG BLOOD, ED (MC, WL, AP ONLY): I-stat hCG, quantitative: <5 m[IU]/mL (ref <5)

## 2022-11-08 LAB — LIPASE, BLOOD: Lipase: 42 U/L (ref 11–51)

## 2022-11-08 NOTE — ED Triage Notes (Signed)
Pt c/o RLQ abdominal pain that worsens with ambulation that started yesterday. Pain associated with suspected fevers, chills, nausea, and diarrhea.   Pt requires spanish speaking interpreter

## 2022-11-08 NOTE — ED Provider Triage Note (Signed)
Emergency Medicine Provider Triage Evaluation Note  Lorraine [name] , a 37 y.o. female  was evaluated in triage.  Pt complains of abd pain. RLQ pain since yesterday with nausea, diarrhea and fever/chills.  No cold sxs, no dysuria or vaginal bleeding/discharge  Review of Systems  Positive: As above Negative: As above  Physical Exam  BP (!) 146/92 (BP Location: Left Arm)   Pulse 65   Temp 99.1 F (37.3 C) (Oral)   Resp 19   SpO2 100%  Gen:   Awake, no distress   Resp:  Normal effort  MSK:   Moves extremities without difficulty  Other:    Medical Decision Making  Medically screening exam initiated at 8:30 PM.  Appropriate orders placed.  Afrika Perez-Matiano was informed that the remainder of the evaluation will be completed by another provider, this initial triage assessment does not replace that evaluation, and the importance of remaining in the ED until their evaluation is complete.     Domenic Moras, PA-C 11/08/22 2031

## 2022-11-09 MED ORDER — OMEPRAZOLE 20 MG PO CPDR: 20.0000 mg | DELAYED_RELEASE_CAPSULE | Freq: Every day | ORAL | 0 refills | Status: DC

## 2022-11-09 MED ORDER — ALUM & MAG HYDROXIDE-SIMETH 200-200-20 MG/5ML PO SUSP
30.0000 mL | Freq: Once | ORAL | Status: AC
  Administered 2022-11-09: 30 mL via ORAL
  Filled 2022-11-09: qty 30

## 2022-11-09 MED ORDER — LIDOCAINE VISCOUS HCL 2 % MT SOLN
15.0000 mL | Freq: Once | OROMUCOSAL | Status: AC
  Administered 2022-11-09: 15 mL via ORAL
  Filled 2022-11-09: qty 15

## 2022-11-09 NOTE — ED Provider Notes (Signed)
[facility] Provider Note   CSN: [id number] Arrival date & time: 11/08/22  2011     History  Chief Complaint  Patient presents with   Abdominal Pain    Lorraine [name] is a 37 y.o. female.  38 year old female with complaint of RUQ abdominal pain onset yesterday, associated with nausea, and feeling hot/cold. Denies changes in bowel or bladder habits. No know sick contacts. Not taking anything for her symptoms. Prior cholecystectomy, c-section. History of asthma, pancreatitis, diverticulitis.   A language interpreter was used (friend at bedside, declines formal translation service (Spanish)).  Abdominal Pain      Home Medications Prior to Admission medications   Medication Sig Start Date End Date Taking? Authorizing Provider  albuterol (VENTOLIN HFA) 108 (90 Base) MCG/ACT inhaler Inhale 2 puffs into the lungs every 4 (four) hours as needed for wheezing or shortness of breath. 08/25/22   Milton Ferguson, MD  omeprazole (PRILOSEC) 20 MG capsule Take 1 capsule (20 mg total) by mouth daily. 11/09/22   Tacy Learn, PA-C  cetirizine (ZYRTEC ALLERGY) 10 MG tablet Take 1 tablet (10 mg total) by mouth 2 (two) times daily. Patient not taking: Reported on 12/06/2020 11/12/20 01/24/21  Muthersbaugh, Jarrett Soho, PA-C      Allergies    Iodinated contrast media, Lupine bean extract, Ceftriaxone, and Morphine    Review of Systems   Review of Systems  Gastrointestinal:  Positive for abdominal pain.    Physical Exam Updated Vital Signs BP (!) 146/92 (BP Location: Left Arm)   Pulse 65   Temp 99.1 F (37.3 C) (Oral)   Resp 19   SpO2 100%  Physical Exam Vitals and nursing note reviewed.  Constitutional:      General: She is not in acute distress.    Appearance: She is well-developed. She is not diaphoretic.  HENT:     Head: Normocephalic and atraumatic.  Cardiovascular:     Rate and Rhythm: Normal rate and regular rhythm.     Heart  sounds: Normal heart sounds.  Pulmonary:     Effort: Pulmonary effort is normal.     Breath sounds: Normal breath sounds.  Abdominal:     Palpations: Abdomen is soft.     Tenderness: There is abdominal tenderness in the right upper quadrant.  Skin:    General: Skin is warm and dry.     Findings: No erythema or rash.  Neurological:     Mental Status: She is alert and oriented to person, place, and time.  Psychiatric:        Behavior: Behavior normal.     ED Results / Procedures / Treatments   Labs (all labs ordered are listed, but only abnormal results are displayed) Labs Reviewed  COMPREHENSIVE METABOLIC PANEL - Abnormal; Notable for the following components:      Result Value   Potassium 3.4 (*)    Glucose, Bld 103 (*)    Total Bilirubin 0.2 (*)    All other components within normal limits  LIPASE, BLOOD  CBC  I-STAT BETA HCG BLOOD, ED ([facility], [facility], [facility] ONLY)    EKG None  Radiology CT ABDOMEN PELVIS WO CONTRAST  Result Date: 11/08/2022 CLINICAL DATA:  Right lower quadrant abdominal pain, fever, chills, nausea, diarrhea EXAM: CT ABDOMEN AND PELVIS WITHOUT CONTRAST TECHNIQUE: Multidetector CT imaging of the abdomen and pelvis was performed following the standard protocol without IV contrast. Unenhanced CT was performed per clinician order. Lack of IV contrast  limits sensitivity and specificity, especially for evaluation of abdominal/pelvic solid viscera. RADIATION DOSE REDUCTION: This exam was performed according to the departmental dose-optimization program which includes automated exposure control, adjustment of the mA and/or kV according to patient size and/or use of iterative reconstruction technique. COMPARISON:  07/19/2022 FINDINGS: Lower chest: No acute pleural or parenchymal lung disease. Hepatobiliary: Cholecystectomy. Unremarkable unenhanced appearance of the liver. Pancreas: Unremarkable unenhanced appearance. Spleen: Unremarkable unenhanced appearance. Adrenals/Urinary  Tract: No urinary tract calculi or obstructive uropathy within either kidney. The adrenals and bladder are unremarkable. Stomach/Bowel: No bowel obstruction or ileus. Normal appendix right lower quadrant. Diverticulosis of the descending and sigmoid colon without diverticulitis. No bowel wall thickening or inflammatory change. Vascular/Lymphatic: No significant vascular findings are present. No enlarged abdominal or pelvic lymph nodes. Reproductive: Uterus and bilateral adnexa are unremarkable. Stable migrated tubal ligation clips. Other: No free fluid or free intraperitoneal gas. No abdominal wall hernia. Musculoskeletal: No acute or destructive bony lesions. Reconstructed images demonstrate no additional findings. IMPRESSION: 1. No acute intra-abdominal or intrapelvic process. Normal appendix. 2. Distal colonic diverticulosis without diverticulitis. Electronically Signed   By: Randa Ngo M.D.   On: 11/08/2022 21:14    Procedures Procedures    Medications Ordered in ED Medications  alum & mag hydroxide-simeth (MAALOX/MYLANTA) 200-200-20 MG/5ML suspension 30 mL (30 mLs Oral Given 11/09/22 0053)    And  lidocaine (XYLOCAINE) 2 % viscous mouth solution 15 mL (15 mLs Oral Given 11/09/22 0053)    ED Course/ Medical Decision Making/ A&P                             Medical Decision Making Amount and/or Complexity of Data Reviewed Labs: ordered.  Risk OTC drugs. Prescription drug management.   This patient presents to the ED for concern of RUQ abdominal pain, this involves an extensive number of treatment options, and is a complaint that carries with it a high risk of complications and morbidity.  The differential diagnosis includes but not limited to diverticulitis, hepatitis, pancreatitis   Co morbidities that complicate the patient evaluation  Prior cholecystectomy, pancreatitis, diverticulitis    Additional history obtained:  Additional history obtained from friend at bedside who  assists with translation  External records from outside source obtained and reviewed including prior labs and imaging on file   Lab Tests:  I Ordered, and personally interpreted labs.  The pertinent results include:  CBC WNL. CMP without significant findings. Hcg negative. Lipase WNL   Imaging Studies ordered:  I ordered imaging studies including CT abd/pelvis  I independently visualized and interpreted imaging which showed negative for free air or other acute findings.  I agree with the radiologist interpretation    Problem List / ED Course / Critical interventions / Medication management  37 year old female with complaint of right upper quadrant abdominal pain, similar symptoms resulting in frequent ER evaluations.  Nothing new or different today.  Associated with nausea, denies vomiting or fevers.  On exam, has mild right upper quadrant tenderness without guarding or rebound, post cholecystectomy.  Labs are reassuring and nonspecific.  CT obtained through triage negative for acute findings.  Patient is provided with GI cocktail in the ER with some improvement in her pain.  Discharged with omeprazole.  Referral to [facility] with request consult TOC to help with follow-up plan. I ordered medication including GI cocktail  for abdominal pain  Reevaluation of the patient after these medicines showed that  the patient improved I have reviewed the patients home medicines and have made adjustments as needed   Social Determinants of Health:  No PCP, referral to GI but unable to see due to lack of funds. Awaiting call back from [facility].    Test / Admission - Considered:  Stable for dc, referral to [facility] to try and assist with PCP follow up and specialty referral if needed.         Final Clinical Impression(s) / ED Diagnoses Final diagnoses:  Right upper quadrant abdominal pain    Rx / DC Orders ED Discharge Orders          Ordered    omeprazole  (PRILOSEC) 20 MG capsule  Daily        11/09/22 0033              Tacy Learn, PA-C 11/09/22 [id number]    Merryl Hacker, MD 11/10/22 [id number]

## 2022-11-09 NOTE — Discharge Instructions (Signed)
Take omeprazole daily as prescribed.  Follow-up with primary care provider.  They requested assistance from the social worker for primary care follow-up.  A primary care provider can help determine if you need to see a specialist and help schedule an appointment for you.

## 2023-01-10 ENCOUNTER — Emergency Department (HOSPITAL_COMMUNITY)
Admission: EM | Admit: 2023-01-10 | Discharge: 2023-01-10 | Disposition: A | Discharge status: Home / Self Care | Payer: Self-pay | Attending: Emergency Medicine | Admitting: Emergency Medicine

## 2023-01-10 ENCOUNTER — Encounter (HOSPITAL_COMMUNITY): Payer: Self-pay

## 2023-01-10 ENCOUNTER — Other Ambulatory Visit: Payer: Self-pay

## 2023-01-10 ENCOUNTER — Emergency Department (HOSPITAL_COMMUNITY): Payer: Self-pay

## 2023-01-10 DIAGNOSIS — R197 Diarrhea, unspecified: Secondary | ICD-10-CM

## 2023-01-10 DIAGNOSIS — R1084 Generalized abdominal pain: Secondary | ICD-10-CM

## 2023-01-10 DIAGNOSIS — E860 Dehydration: Secondary | ICD-10-CM | POA: Insufficient documentation

## 2023-01-10 LAB — URINALYSIS, ROUTINE W REFLEX MICROSCOPIC
Glucose, UA: NEGATIVE mg/dL
Hgb urine dipstick: NEGATIVE
Ketones, ur: 15 mg/dL — AB
Leukocytes,Ua: NEGATIVE
Nitrite: NEGATIVE
Specific Gravity, Urine: >1.03 — ABNORMAL HIGH (ref 1.005–1.030)
pH: 6 (ref 5.0–8.0)

## 2023-01-10 LAB — CBC WITH DIFFERENTIAL/PLATELET
Abs Immature Granulocytes: 0.01 10*3/uL (ref 0.00–0.07)
Basophils Absolute: 0 10*3/uL (ref 0.0–0.1)
Basophils Relative: 1 %
Eosinophils Absolute: 0.1 10*3/uL (ref 0.0–0.5)
Eosinophils Relative: 1 %
HCT: 37.2 % (ref 36.0–46.0)
Hemoglobin: 12.2 g/dL (ref 12.0–15.0)
Immature Granulocytes: 0 %
Lymphocytes Relative: 19 %
Lymphs Abs: 0.8 10*3/uL (ref 0.7–4.0)
MCH: 30.3 pg (ref 26.0–34.0)
MCHC: 32.8 g/dL (ref 30.0–36.0)
MCV: 92.3 fL (ref 80.0–100.0)
Monocytes Absolute: 0.4 10*3/uL (ref 0.1–1.0)
Monocytes Relative: 8 %
Neutro Abs: 3.1 10*3/uL (ref 1.7–7.7)
Neutrophils Relative %: 71 %
Platelets: 147 10*3/uL — ABNORMAL LOW (ref 150–400)
RBC: 4.03 MIL/uL (ref 3.87–5.11)
RDW: 12.6 % (ref 11.5–15.5)
WBC: 4.4 10*3/uL (ref 4.0–10.5)
nRBC: 0 % (ref 0.0–0.2)

## 2023-01-10 LAB — COMPREHENSIVE METABOLIC PANEL
ALT: 21 U/L (ref 0–44)
AST: 27 U/L (ref 15–41)
Albumin: 4.3 g/dL (ref 3.5–5.0)
Alkaline Phosphatase: 94 U/L (ref 38–126)
Anion gap: 11 (ref 5–15)
BUN: 21 mg/dL — ABNORMAL HIGH (ref 6–20)
CO2: 22 mmol/L (ref 22–32)
Calcium: 8.9 mg/dL (ref 8.9–10.3)
Chloride: 104 mmol/L (ref 98–111)
Creatinine, Ser: 0.93 mg/dL (ref 0.44–1.00)
GFR, Estimated: >60 mL/min (ref >60)
Glucose, Bld: 95 mg/dL (ref 70–99)
Potassium: 3.3 mmol/L — ABNORMAL LOW (ref 3.5–5.1)
Sodium: 137 mmol/L (ref 135–145)
Total Bilirubin: 0.6 mg/dL (ref 0.3–1.2)
Total Protein: 7.7 g/dL (ref 6.5–8.1)

## 2023-01-10 LAB — URINALYSIS, MICROSCOPIC (REFLEX)

## 2023-01-10 LAB — LIPASE, BLOOD: Lipase: 27 U/L (ref 11–51)

## 2023-01-10 LAB — C DIFFICILE QUICK SCREEN W PCR REFLEX
C Diff antigen: NEGATIVE
C Diff interpretation: NOT DETECTED
C Diff toxin: NEGATIVE

## 2023-01-10 LAB — POC URINE PREG, ED: Preg Test, Ur: NEGATIVE

## 2023-01-10 MED ORDER — SODIUM CHLORIDE 0.9 % IV BOLUS
1000.0000 mL | Freq: Once | INTRAVENOUS | Status: AC
  Administered 2023-01-10: 1000 mL via INTRAVENOUS

## 2023-01-10 MED ORDER — ONDANSETRON HCL 4 MG/2ML IJ SOLN
4.0000 mg | Freq: Once | INTRAMUSCULAR | Status: AC
  Administered 2023-01-10: 4 mg via INTRAVENOUS
  Filled 2023-01-10: qty 2

## 2023-01-10 MED ORDER — HYDROMORPHONE HCL 1 MG/ML IJ SOLN
1.0000 mg | Freq: Once | INTRAMUSCULAR | Status: AC
  Administered 2023-01-10: 1 mg via INTRAVENOUS
  Filled 2023-01-10: qty 1

## 2023-01-10 MED ORDER — DROPERIDOL 2.5 MG/ML IJ SOLN
1.2500 mg | Freq: Once | INTRAMUSCULAR | Status: AC
  Administered 2023-01-10: 1.25 mg via INTRAVENOUS
  Filled 2023-01-10: qty 2

## 2023-01-10 MED ORDER — FENTANYL CITRATE PF 50 MCG/ML IJ SOSY
25.0000 ug | PREFILLED_SYRINGE | Freq: Once | INTRAMUSCULAR | Status: AC
  Administered 2023-01-10: 25 ug via INTRAVENOUS
  Filled 2023-01-10: qty 1

## 2023-01-10 MED ORDER — ONDANSETRON 4 MG PO TBDP: 4.0000 mg | ORAL_TABLET | Freq: Three times a day (TID) | ORAL | 0 refills | Status: DC | PRN

## 2023-01-10 NOTE — ED Triage Notes (Signed)
Patient has had centralized abdominal pain since yesterday along with chills and diarrhea.

## 2023-01-10 NOTE — ED Provider Notes (Signed)
6:00 PM Assumed care of patient from off-going team. For more details, please see note from same day.  In brief, this is a 37 y.o. female with N/V/D, clear CT abd pelvis. Still vomiting.  Plan/Dispo at time of sign-out & ED Course since sign-out:  symptomatic improvement, inapsine, stool sample  BP 112/69   Pulse 85   Temp 98.4 F (36.9 C)   Resp 16   Ht 5' (1.524 m)   Wt 79.4 kg   SpO2 97%   BMI 34.18 kg/m    ED Course:   Clinical Course as of 01/10/23 1800  Thu Jan 10, 2023  1702 Patient symptomatically improved after fluids droperidol.  Stool sample sent for C. difficile PCR as well as GI PCR stool panel.  Patient states that she works in [employer] and I advised her to stay out of work until her symptoms have resolved.  Given a work note. [HN]    Clinical Course User Index [HN] Loetta Rough, MD    Dispo: DC ------------------------------- Vivi Barrack, MD Emergency Medicine  This note was created using dictation software, which may contain spelling or grammatical errors.   Loetta Rough, MD 01/10/23 1800

## 2023-01-10 NOTE — Discharge Instructions (Addendum)
Gracias por venir al [facility]. Lo atendieron por nuseas/vmitos/diarrea. Hicimos un examen, laboratorios e imgenes, y Psychiatrist. Probablemente tenga gastroenteritis, una infeccin viral que causa nuseas, vmitos o diarrea. Por favor mantnganse bien hidratados en casa. Le hemos recetado 4 mg de zofran debajo de la lengua para tomar cada 6 a 8 horas segn sea necesario para las nuseas/vmitos.  Haga un seguimiento con su proveedor de atencin primaria dentro de 1 semana.  No dude en regresar al servicio de urgencias o llamar al 911 si experimenta: -Empeoramiento de los sntomas -Nuseas/vmitos tan intensos que no puede comer ni beber nada. -Aturdimiento, desmayo. -Fiebre/escalofros -Cualquier otra cosa que te preocupe

## 2023-01-10 NOTE — ED Provider Notes (Signed)
[place] EMERGENCY DEPARTMENT AT [facility] Provider Note   CSN: [id number] Arrival date & time: 01/10/23  0945     History  Chief Complaint  Patient presents with   Abdominal Pain    Lorraine [name] is a 37 y.o. female.  Pt is a 36 yo female with pmhx significant for diverticulitis and pyelonephritis.  Pt said she has had abd pain with n/v since yesterday.  She's had diarrhea as well.  No fever.  Due to language barrier, an interpreter was present during the history-taking and subsequent discussion (and for part of the physical exam) with this patient.        Home Medications Prior to Admission medications   Medication Sig Start Date End Date Taking? Authorizing Provider  albuterol (VENTOLIN HFA) 108 (90 Base) MCG/ACT inhaler Inhale 2 puffs into the lungs every 4 (four) hours as needed for wheezing or shortness of breath. 08/25/22   Bethann Berkshire, MD  omeprazole (PRILOSEC) 20 MG capsule Take 1 capsule (20 mg total) by mouth daily. 11/09/22   Jeannie Fend, PA-C  cetirizine (ZYRTEC ALLERGY) 10 MG tablet Take 1 tablet (10 mg total) by mouth 2 (two) times daily. Patient not taking: Reported on 12/06/2020 11/12/20 01/24/21  Muthersbaugh, Dahlia Client, PA-C      Allergies    Iodinated contrast media, Lupine bean extract, Ceftriaxone, and Morphine    Review of Systems   Review of Systems  Gastrointestinal:  Positive for abdominal pain, diarrhea, nausea and vomiting.  All other systems reviewed and are negative.   Physical Exam Updated Vital Signs BP 111/80   Pulse 88   Temp 98.4 F (36.9 C)   Resp 16   Ht 5' (1.524 m)   Wt 79.4 kg   SpO2 97%   BMI 34.18 kg/m  Physical Exam Vitals and nursing note reviewed.  Constitutional:      Appearance: She is well-developed. She is ill-appearing.  HENT:     Head: Normocephalic and atraumatic.     Mouth/Throat:     Mouth: Mucous membranes are dry.     Pharynx: Oropharynx is clear.  Eyes:     Extraocular  Movements: Extraocular movements intact.     Pupils: Pupils are equal, round, and reactive to light.  Cardiovascular:     Rate and Rhythm: Normal rate and regular rhythm.     Heart sounds: Normal heart sounds.  Pulmonary:     Effort: Pulmonary effort is normal.     Breath sounds: Normal breath sounds.  Abdominal:     General: Abdomen is flat. Bowel sounds are normal.     Palpations: Abdomen is soft.     Tenderness: There is generalized abdominal tenderness.  Skin:    General: Skin is warm.     Capillary Refill: Capillary refill takes less than 2 seconds.  Neurological:     General: No focal deficit present.     Mental Status: She is alert and oriented to person, place, and time.  Psychiatric:        Mood and Affect: Mood normal.        Behavior: Behavior normal.     ED Results / Procedures / Treatments   Labs (all labs ordered are listed, but only abnormal results are displayed) Labs Reviewed  COMPREHENSIVE METABOLIC PANEL - Abnormal; Notable for the following components:      Result Value   Potassium 3.3 (*)    BUN 21 (*)    All other components within normal limits  URINALYSIS, ROUTINE W REFLEX MICROSCOPIC - Abnormal; Notable for the following components:   Specific Gravity, Urine >1.030 (*)    Bilirubin Urine SMALL (*)    Ketones, ur 15 (*)    Protein, ur TRACE (*)    All other components within normal limits  CBC WITH DIFFERENTIAL/PLATELET - Abnormal; Notable for the following components:   Platelets 147 (*)    All other components within normal limits  URINALYSIS, MICROSCOPIC (REFLEX) - Abnormal; Notable for the following components:   Bacteria, UA FEW (*)    All other components within normal limits  GASTROINTESTINAL PANEL BY PCR, STOOL (REPLACES STOOL CULTURE)  C DIFFICILE QUICK SCREEN W PCR REFLEX    LIPASE, BLOOD  CBC WITH DIFFERENTIAL/PLATELET  POC URINE PREG, ED    EKG None  Radiology CT ABDOMEN PELVIS WO CONTRAST  Result Date: 01/10/2023 CLINICAL  DATA:  Abdominal pain EXAM: CT ABDOMEN AND PELVIS WITHOUT CONTRAST TECHNIQUE: Multidetector CT imaging of the abdomen and pelvis was performed following the standard protocol without IV contrast. RADIATION DOSE REDUCTION: This exam was performed according to the departmental dose-optimization program which includes automated exposure control, adjustment of the mA and/or kV according to patient size and/or use of iterative reconstruction technique. COMPARISON:  CT 11/08/2022 and older FINDINGS: Lower chest: There is some linear opacity lung bases likely scar or atelectasis. No pleural effusion. The right hemidiaphragm is clipped off the edge of the film as is the extreme upper portion of the liver. Hepatobiliary: Previous cholecystectomy. No obvious hepatic mass on this noncontrast examination. There are some punctate calcifications in segment 7. Possibly related to old infectious or inflammatory process. Unchanged from previous. Pancreas: Unremarkable. No pancreatic ductal dilatation or surrounding inflammatory changes. Spleen: Normal in size without focal abnormality. Adrenals/Urinary Tract: The adrenal glands are preserved. No right renal stone. There is a punctate midportion and upper pole left-sided renal stone. No ureteral stones. The bladder is underdistended. Stomach/Bowel: On this non oral contrast exam, large bowel has a normal course and caliber. There are some descending and sigmoid colon diverticula. Normal appendix extends medial and inferior to the cecum in the right lower quadrant. There is some fluid and debris in the stomach. Small bowel is nondilated. Vascular/Lymphatic: Normal caliber aorta and IVC. There are some small mesenteric and retroperitoneal nodes identified. Slightly more numerous than usually seen but not pathologic by size criteria and these were seen previously. Reproductive: Preserved uterus ovaries. There are presumed tubal ligation clips identified caudal to the body of the uterus  between the uterus in the bladder dome. Slightly different configuration than previous. Other: No free air or free fluid. Tiny fat containing umbilical hernia. Musculoskeletal: Slight curvature of the lumbar spine. Mild degenerative changes. IMPRESSION: Left-sided colonic diverticula. No bowel obstruction, free air or free fluid. Normal appendix. Two punctate nonobstructing left-sided renal stones. No ureteral stones. Electronically Signed   By: Karen Kays M.D.   On: 01/10/2023 15:12    Procedures Procedures    Medications Ordered in ED Medications  droperidol (INAPSINE) 2.5 MG/ML injection 1.25 mg (has no administration in time range)  sodium chloride 0.9 % bolus 1,000 mL (has no administration in time range)  sodium chloride 0.9 % bolus 1,000 mL (0 mLs Intravenous Stopped 01/10/23 1517)  ondansetron (ZOFRAN) injection 4 mg (4 mg Intravenous Given 01/10/23 1047)  fentaNYL (SUBLIMAZE) injection 25 mcg (25 mcg Intravenous Given 01/10/23 1048)  HYDROmorphone (DILAUDID) injection 1 mg (1 mg Intravenous Given 01/10/23 1219)  ondansetron (ZOFRAN) injection 4 mg (  4 mg Intravenous Given 01/10/23 1322)    ED Course/ Medical Decision Making/ A&P                             Medical Decision Making Amount and/or Complexity of Data Reviewed Labs: ordered. Radiology: ordered.  Risk Prescription drug management.   This patient presents to the ED for concern of abd pain, this involves an extensive number of treatment options, and is a complaint that carries with it a high risk of complications and morbidity.  The differential diagnosis includes pregnancy, uti, pyelo, diverticulitis   Co morbidities that complicate the patient evaluation  Hx pyelo and diverticulitis   Additional history obtained:  Additional history obtained from epic chart review  Lab Tests:  I Ordered, and personally interpreted labs.  The pertinent results include:  preg neg, cbc nl, cmp nl, lip nl, ua nl   Imaging  Studies ordered:  I ordered imaging studies including ct abd/pelvis wo  I independently visualized and interpreted imaging which showed  Left-sided colonic diverticula. No bowel obstruction, free air or  free fluid. Normal appendix.    Two punctate nonobstructing left-sided renal stones. No ureteral  stones.   I agree with the radiologist interpretation   Cardiac Monitoring:  The patient was maintained on a cardiac monitor.  I personally viewed and interpreted the cardiac monitored which showed an underlying rhythm of: nsr   Medicines ordered and prescription drug management:  I ordered medication including fentnayl/dilaudid/zofran  for pain and nausea  Reevaluation of the patient after these medicines showed that the patient improved I have reviewed the patients home medicines and have made adjustments as needed   Test Considered:  ct   Critical Interventions:  Pain control   Problem List / ED Course:  Abd pain, n/v/d:  pt is still feeling nauseous, so additional meds/fluids ordered.  Pt signed out to Dr. Jearld Fenton at shift change.   Reevaluation:  After the interventions noted above, I reevaluated the patient and found that they have :improved   Social Determinants of Health:  Spanish speaker; no insurance; no pcp   Dispostion:  Pending symptomatic improvement        Final Clinical Impression(s) / ED Diagnoses Final diagnoses:  Generalized abdominal pain  Nausea vomiting and diarrhea  Dehydration    Rx / DC Orders ED Discharge Orders     None         Jacalyn Lefevre, MD 01/10/23 1539

## 2023-01-11 LAB — GASTROINTESTINAL PANEL BY PCR, STOOL (REPLACES STOOL CULTURE)

## 2023-02-28 ENCOUNTER — Encounter (HOSPITAL_COMMUNITY): Payer: Self-pay

## 2023-02-28 ENCOUNTER — Emergency Department (HOSPITAL_COMMUNITY): Payer: Self-pay

## 2023-02-28 ENCOUNTER — Other Ambulatory Visit: Payer: Self-pay

## 2023-02-28 ENCOUNTER — Emergency Department (HOSPITAL_COMMUNITY)
Admission: EM | Admit: 2023-02-28 | Discharge: 2023-02-28 | Disposition: A | Discharge status: Home / Self Care | Payer: Self-pay | Attending: Emergency Medicine | Admitting: Emergency Medicine

## 2023-02-28 DIAGNOSIS — R072 Precordial pain: Secondary | ICD-10-CM | POA: Insufficient documentation

## 2023-02-28 DIAGNOSIS — R0981 Nasal congestion: Secondary | ICD-10-CM | POA: Insufficient documentation

## 2023-02-28 DIAGNOSIS — R051 Acute cough: Secondary | ICD-10-CM | POA: Insufficient documentation

## 2023-02-28 DIAGNOSIS — J45909 Unspecified asthma, uncomplicated: Secondary | ICD-10-CM | POA: Insufficient documentation

## 2023-02-28 DIAGNOSIS — Z7951 Long term (current) use of inhaled steroids: Secondary | ICD-10-CM | POA: Insufficient documentation

## 2023-02-28 MED ORDER — ALBUTEROL SULFATE HFA 108 (90 BASE) MCG/ACT IN AERS
2.0000 | INHALATION_SPRAY | Freq: Once | RESPIRATORY_TRACT | Status: AC
  Administered 2023-02-28: 2 via RESPIRATORY_TRACT
  Filled 2023-02-28: qty 6.7

## 2023-02-28 MED ORDER — IBUPROFEN 200 MG PO TABS
400.0000 mg | ORAL_TABLET | Freq: Once | ORAL | Status: AC
  Administered 2023-02-28: 400 mg via ORAL
  Filled 2023-02-28: qty 2

## 2023-02-28 NOTE — ED Triage Notes (Signed)
Patient reports chest pain due to congestion and pressure in her chest. Patient states she is having a hard time sleeping. Denies fever, N/V,  endorses chills, cough. Hx of asthma.

## 2023-02-28 NOTE — ED Provider Notes (Signed)
[place] EMERGENCY DEPARTMENT AT [facility] Provider Note   CSN: [id number] Arrival date & time: 02/28/23  0257     History  Chief Complaint  Patient presents with   Nasal Congestion   Chest Pain    Lorraine [name] is a 37 y.o. female.  The history is provided by the patient. The history is limited by a language barrier. A language interpreter was used [employer] boy - edgar [phone]).  Chest Pain Pain location:  Substernal area Pain quality: pressure   Timing:  Intermittent Chronicity:  New Associated symptoms: cough    Patient reports over the past 2 days she has had congestion.  She reports increasing cough without hemoptysis.  She reports she has pressure and congestion in her face, and is now having chest pressure.  No fevers, no vomiting, she reports chills.  She has asthma, but does not have any home medications.  She is a non-smoker.  No recent travel. No sick contacts She is unable to describe what causes the chest pressure to worsen.   Past Medical History:  Diagnosis Date   Angio-edema    Asthma    Inhaler used 01/02/16   Diverticulitis    Nausea & vomiting 04/02/2017   Pancreatitis    Pre-diabetes    Pyelonephritis    Urticaria     Home Medications Prior to Admission medications   Medication Sig Start Date End Date Taking? Authorizing Provider  albuterol (VENTOLIN HFA) 108 (90 Base) MCG/ACT inhaler Inhale 2 puffs into the lungs every 4 (four) hours as needed for wheezing or shortness of breath. 08/25/22   Bethann Berkshire, MD  omeprazole (PRILOSEC) 20 MG capsule Take 1 capsule (20 mg total) by mouth daily. 11/09/22   Jeannie Fend, PA-C  ondansetron (ZOFRAN-ODT) 4 MG disintegrating tablet Take 1 tablet (4 mg total) by mouth every 8 (eight) hours as needed for nausea or vomiting. 01/10/23   Loetta Rough, MD  cetirizine (ZYRTEC ALLERGY) 10 MG tablet Take 1 tablet (10 mg total) by mouth 2 (two) times daily. Patient not taking: Reported on 12/06/2020  11/12/20 01/24/21  Muthersbaugh, Dahlia Client, PA-C      Allergies    Iodinated contrast media, Lupine bean extract, Ceftriaxone, and Morphine    Review of Systems   Review of Systems  Constitutional:  Positive for chills.  HENT:  Positive for congestion.   Respiratory:  Positive for cough.   Cardiovascular:  Positive for chest pain.    Physical Exam Updated Vital Signs BP 129/73 (BP Location: Left Arm)   Pulse 73   Temp 97.8 F (36.6 C) (Oral)   Resp 17   LMP 02/04/2023   SpO2 100%  Physical Exam CONSTITUTIONAL: Well developed/well nourished HEAD: Normocephalic/atraumatic EYES: EOMI/PERRL ENMT: Mucous membranes moist, nasal congestion NECK: supple no meningeal signs SPINE/BACK:entire spine nontender CV: S1/S2 noted, no murmurs/rubs/gallops noted LUNGS: Lungs are clear to auscultation bilaterally, no apparent distress Chest-tenderness to palpation ABDOMEN: soft NEURO: Pt is awake/alert/appropriate, moves all extremitiesx4.  No facial droop.  Patient ambulates independently without difficulty EXTREMITIES: pulses normal/equal, full ROM, no lower extremity edema SKIN: warm, color normal PSYCH: Flat affect  ED Results / Procedures / Treatments   Labs (all labs ordered are listed, but only abnormal results are displayed) Labs Reviewed - No data to display  EKG EKG Interpretation  Date/Time:  Thursday February 28 2023 03:11:39 EDT Ventricular Rate:  74 PR Interval:  146 QRS Duration: 103 QT Interval:  412 QTC Calculation: 458 R Axis:  64 Text Interpretation: Sinus rhythm Low voltage, precordial leads Abnormal R-wave progression, early transition Confirmed by Zadie Rhine ([id number]) on 02/28/2023 3:14:35 AM  Radiology DG Chest 2 View  Result Date: 02/28/2023 CLINICAL DATA:  Chest pain, shortness of breath, congestion EXAM: CHEST - 2 VIEW COMPARISON:  08/25/2022 FINDINGS: The heart size and mediastinal contours are within normal limits. Both lungs are clear. The visualized  skeletal structures are unremarkable. IMPRESSION: Normal study. Electronically Signed   By: Charlett Nose M.D.   On: 02/28/2023 03:33    Procedures Procedures    Medications Ordered in ED Medications  ibuprofen (ADVIL) tablet 400 mg (400 mg Oral Given 02/28/23 0315)  albuterol (VENTOLIN HFA) 108 (90 Base) MCG/ACT inhaler 2 puff (2 puffs Inhalation Given 02/28/23 0316)    ED Course/ Medical Decision Making/ A&P Clinical Course as of 02/28/23 0427  Thu Feb 28, 2023  0316 Patient reports recent cough and congestion having chest pressure.  She is in no acute distress.  She clearly has an upper respiratory infection.  Will obtain chest x-ray.  EKG is unremarkable [DW]  0426 Patient resting comfortably.  No acute distress.  Reports feeling improved.  I utilized interpreter at time of discharge.   At this point, patient appears to have upper respiratory infection with cough and congestion Chest pain is clearly reproducible on exam.  Discharged home [DW]    Clinical Course User Index [DW] Zadie Rhine, MD                             Medical Decision Making Amount and/or Complexity of Data Reviewed Radiology: ordered. ECG/medicine tests: ordered.  Risk OTC drugs. Prescription drug management.   This patient presents to the ED for concern of chest pain, this involves an extensive number of treatment options, and is a complaint that carries with it a high risk of complications and morbidity.  The differential diagnosis includes but is not limited to acute coronary syndrome, aortic dissection, pulmonary embolism, pericarditis, pneumothorax, pneumonia, myocarditis, pleurisy, esophageal rupture   Comorbidities that complicate the patient evaluation: Patient's presentation is complicated by their history of asthma  Social Determinants of Health: Patient's  limited english proficiency   increases the complexity of managing their presentation   Imaging Studies ordered: I ordered  imaging studies including X-ray chest   I independently visualized and interpreted imaging which showed no acute findings I agree with the radiologist interpretation  Cardiac Monitoring: The patient was maintained on a cardiac monitor.  I personally viewed and interpreted the cardiac monitor which showed an underlying rhythm of:  sinus rhythm  Medicines ordered and prescription drug management: I ordered medication including ibuprofen, albuterol  for chest pain  Reevaluation of the patient after these medicines showed that the patient    improved  Test Considered: Patient is low risk / negative by [facility] score, therefore do not feel that further workup is indicated.   Reevaluation: After the interventions noted above, I reevaluated the patient and found that they have :improved  Complexity of problems addressed: Patient's presentation is most consistent with  acute presentation with potential threat to life or bodily function  Disposition: After consideration of the diagnostic results and the patient's response to treatment,  I feel that the patent would benefit from discharge   .           Final Clinical Impression(s) / ED Diagnoses Final diagnoses:  Acute cough  Precordial pain  Rx / DC Orders ED Discharge Orders     None         Zadie Rhine, MD 02/28/23 [phone]

## 2023-03-08 ENCOUNTER — Other Ambulatory Visit: Payer: Self-pay

## 2023-03-08 ENCOUNTER — Emergency Department (HOSPITAL_COMMUNITY)
Admission: EM | Admit: 2023-03-08 | Discharge: 2023-03-08 | Discharge status: Against Medical Advice | Payer: Self-pay | Attending: Emergency Medicine | Admitting: Emergency Medicine

## 2023-03-08 DIAGNOSIS — Z5321 Procedure and treatment not carried out due to patient leaving prior to being seen by health care provider: Secondary | ICD-10-CM | POA: Insufficient documentation

## 2023-03-08 DIAGNOSIS — R112 Nausea with vomiting, unspecified: Secondary | ICD-10-CM | POA: Insufficient documentation

## 2023-03-08 DIAGNOSIS — R1031 Right lower quadrant pain: Secondary | ICD-10-CM | POA: Insufficient documentation

## 2023-03-08 NOTE — ED Triage Notes (Signed)
Pt arrived via POV. C/o RLQ abd pain, N/V since last night  AOx4

## 2023-03-30 ENCOUNTER — Emergency Department (HOSPITAL_BASED_OUTPATIENT_CLINIC_OR_DEPARTMENT_OTHER)
Admission: EM | Admit: 2023-03-30 | Discharge: 2023-03-30 | Disposition: A | Discharge status: Home / Self Care | Payer: Self-pay | Attending: Emergency Medicine | Admitting: Emergency Medicine

## 2023-03-30 ENCOUNTER — Other Ambulatory Visit: Payer: Self-pay

## 2023-03-30 ENCOUNTER — Encounter (HOSPITAL_BASED_OUTPATIENT_CLINIC_OR_DEPARTMENT_OTHER): Payer: Self-pay

## 2023-03-30 ENCOUNTER — Emergency Department (HOSPITAL_BASED_OUTPATIENT_CLINIC_OR_DEPARTMENT_OTHER): Payer: Self-pay

## 2023-03-30 DIAGNOSIS — R112 Nausea with vomiting, unspecified: Secondary | ICD-10-CM | POA: Insufficient documentation

## 2023-03-30 DIAGNOSIS — R1031 Right lower quadrant pain: Secondary | ICD-10-CM | POA: Insufficient documentation

## 2023-03-30 DIAGNOSIS — R1011 Right upper quadrant pain: Secondary | ICD-10-CM | POA: Insufficient documentation

## 2023-03-30 DIAGNOSIS — R1084 Generalized abdominal pain: Secondary | ICD-10-CM | POA: Insufficient documentation

## 2023-03-30 DIAGNOSIS — R1013 Epigastric pain: Secondary | ICD-10-CM | POA: Insufficient documentation

## 2023-03-30 LAB — CBC
HCT: 37.9 % (ref 36.0–46.0)
Hemoglobin: 12.7 g/dL (ref 12.0–15.0)
MCH: 30.4 pg (ref 26.0–34.0)
MCHC: 33.5 g/dL (ref 30.0–36.0)
MCV: 90.7 fL (ref 80.0–100.0)
Platelets: 280 10*3/uL (ref 150–400)
RBC: 4.18 MIL/uL (ref 3.87–5.11)
RDW: 12.3 % (ref 11.5–15.5)
WBC: 6.4 10*3/uL (ref 4.0–10.5)
nRBC: 0 % (ref 0.0–0.2)

## 2023-03-30 LAB — COMPREHENSIVE METABOLIC PANEL
ALT: 17 U/L (ref 0–44)
AST: 20 U/L (ref 15–41)
Albumin: 3.9 g/dL (ref 3.5–5.0)
Alkaline Phosphatase: 91 U/L (ref 38–126)
Anion gap: 8 (ref 5–15)
BUN: 18 mg/dL (ref 6–20)
CO2: 23 mmol/L (ref 22–32)
Calcium: 8.8 mg/dL — ABNORMAL LOW (ref 8.9–10.3)
Chloride: 107 mmol/L (ref 98–111)
Creatinine, Ser: 0.77 mg/dL (ref 0.44–1.00)
GFR, Estimated: >60 mL/min (ref >60)
Glucose, Bld: 133 mg/dL — ABNORMAL HIGH (ref 70–99)
Potassium: 3.3 mmol/L — ABNORMAL LOW (ref 3.5–5.1)
Sodium: 138 mmol/L (ref 135–145)
Total Bilirubin: 0.4 mg/dL (ref 0.3–1.2)
Total Protein: 7.2 g/dL (ref 6.5–8.1)

## 2023-03-30 LAB — URINALYSIS, ROUTINE W REFLEX MICROSCOPIC
Bilirubin Urine: NEGATIVE
Glucose, UA: NEGATIVE mg/dL
Ketones, ur: NEGATIVE mg/dL
Leukocytes,Ua: NEGATIVE
Nitrite: NEGATIVE
Protein, ur: 30 mg/dL — AB
Specific Gravity, Urine: >=1.03 (ref 1.005–1.030)
pH: 6 (ref 5.0–8.0)

## 2023-03-30 LAB — URINALYSIS, MICROSCOPIC (REFLEX)

## 2023-03-30 LAB — PREGNANCY, URINE: Preg Test, Ur: NEGATIVE

## 2023-03-30 LAB — LIPASE, BLOOD: Lipase: 35 U/L (ref 11–51)

## 2023-03-30 MED ORDER — SODIUM CHLORIDE 0.9 % IV BOLUS
1000.0000 mL | Freq: Once | INTRAVENOUS | Status: AC
  Administered 2023-03-30: 1000 mL via INTRAVENOUS

## 2023-03-30 MED ORDER — KETOROLAC TROMETHAMINE 30 MG/ML IJ SOLN
30.0000 mg | Freq: Once | INTRAMUSCULAR | Status: AC
  Administered 2023-03-30: 30 mg via INTRAVENOUS
  Filled 2023-03-30: qty 1

## 2023-03-30 MED ORDER — FENTANYL CITRATE PF 50 MCG/ML IJ SOSY
50.0000 ug | PREFILLED_SYRINGE | Freq: Once | INTRAMUSCULAR | Status: AC
  Administered 2023-03-30: 50 ug via INTRAVENOUS
  Filled 2023-03-30: qty 1

## 2023-03-30 MED ORDER — ONDANSETRON HCL 4 MG/2ML IJ SOLN
4.0000 mg | Freq: Once | INTRAMUSCULAR | Status: AC
  Administered 2023-03-30: 4 mg via INTRAVENOUS
  Filled 2023-03-30: qty 2

## 2023-03-30 MED ORDER — ONDANSETRON 4 MG PO TBDP: 4.0000 mg | ORAL_TABLET | Freq: Three times a day (TID) | ORAL | 0 refills | Status: DC | PRN

## 2023-03-30 MED ORDER — DICYCLOMINE HCL 20 MG PO TABS: 20.0000 mg | ORAL_TABLET | Freq: Two times a day (BID) | ORAL | 0 refills | Status: DC

## 2023-03-30 NOTE — ED Provider Notes (Signed)
[place] [facility] Provider Note   CSN: [id number] Arrival date & time: 03/30/23  1554     History  Chief Complaint  Patient presents with   Abdominal Pain    Lorraine [name] is a 37 y.o. female here for evaluation abdominal pain over the last 2 days.  Located to left flank as well as diffuse right side of her abdomen.  Worse with movement and bending over, not worse with food intake.  She has been having some nausea that vomiting.  No diarrhea.  Having normal stools.  She fell like she has had a fever however has not taking her temperature.  No cough, congestion, rhinorrhea, no recent falls or injuries.  No dysuria or hematuria.  No pelvic pain, vaginal discharge or concern for STD.  S/p cholecystectomy   HPI     Home Medications Prior to Admission medications   Medication Sig Start Date End Date Taking? Authorizing Provider  dicyclomine (BENTYL) 20 MG tablet Take 1 tablet (20 mg total) by mouth 2 (two) times daily. 03/30/23  Yes Talena Neira A, PA-C  ondansetron (ZOFRAN-ODT) 4 MG disintegrating tablet Take 1 tablet (4 mg total) by mouth every 8 (eight) hours as needed for nausea or vomiting. 03/30/23  Yes Ryen Heitmeyer A, PA-C  albuterol (VENTOLIN HFA) 108 (90 Base) MCG/ACT inhaler Inhale 2 puffs into the lungs every 4 (four) hours as needed for wheezing or shortness of breath. 08/25/22   Bethann Berkshire, MD  omeprazole (PRILOSEC) 20 MG capsule Take 1 capsule (20 mg total) by mouth daily. 11/09/22   Jeannie Fend, PA-C  cetirizine (ZYRTEC ALLERGY) 10 MG tablet Take 1 tablet (10 mg total) by mouth 2 (two) times daily. Patient not taking: Reported on 12/06/2020 11/12/20 01/24/21  Muthersbaugh, Dahlia Client, PA-C      Allergies    Iodinated contrast media, Lupine bean extract, Ceftriaxone, and Morphine    Review of Systems   Review of Systems  Constitutional: Negative.   HENT: Negative.    Respiratory: Negative.    Cardiovascular: Negative.    Gastrointestinal:  Positive for abdominal pain and nausea. Negative for abdominal distention, anal bleeding, blood in stool, constipation, diarrhea, rectal pain and vomiting.  Genitourinary: Negative.   Musculoskeletal: Negative.   Skin: Negative.   Neurological: Negative.   All other systems reviewed and are negative.  Physical Exam Updated Vital Signs BP 101/78   Pulse 73   Temp 98.4 F (36.9 C) (Oral)   Resp 15   Ht 5' (1.524 m)   Wt 78 kg   LMP 01/28/2023   SpO2 97%   BMI 33.59 kg/m  Physical Exam Vitals and nursing note reviewed.  Constitutional:      General: She is not in acute distress.    Appearance: She is well-developed. She is not ill-appearing, toxic-appearing or diaphoretic.  HENT:     Head: Normocephalic and atraumatic.  Eyes:     Pupils: Pupils are equal, round, and reactive to light.  Cardiovascular:     Rate and Rhythm: Normal rate.     Heart sounds: Normal heart sounds.  Pulmonary:     Effort: Pulmonary effort is normal. No respiratory distress.     Breath sounds: Normal breath sounds.     Comments: Clear bilaterally, speaks in full sentences without difficulty Abdominal:     General: Bowel sounds are normal. There is no distension.     Palpations: Abdomen is soft.     Tenderness: There is generalized  abdominal tenderness and tenderness in the right upper quadrant, right lower quadrant, epigastric area, left upper quadrant and left lower quadrant. There is no right CVA tenderness, left CVA tenderness, guarding or rebound.     Hernia: No hernia is present.     Comments: Generalized tenderness, no rebound or guarding.  Musculoskeletal:        General: Normal range of motion.     Cervical back: Normal range of motion.  Skin:    General: Skin is warm and dry.     Capillary Refill: Capillary refill takes less than 2 seconds.  Neurological:     General: No focal deficit present.     Mental Status: She is alert.  Psychiatric:        Mood and Affect:  Mood normal.     ED Results / Procedures / Treatments   Labs (all labs ordered are listed, but only abnormal results are displayed) Labs Reviewed  COMPREHENSIVE METABOLIC PANEL - Abnormal; Notable for the following components:      Result Value   Potassium 3.3 (*)    Glucose, Bld 133 (*)    Calcium 8.8 (*)    All other components within normal limits  URINALYSIS, ROUTINE W REFLEX MICROSCOPIC - Abnormal; Notable for the following components:   APPearance HAZY (*)    Hgb urine dipstick LARGE (*)    Protein, ur 30 (*)    All other components within normal limits  URINALYSIS, MICROSCOPIC (REFLEX) - Abnormal; Notable for the following components:   Bacteria, UA RARE (*)    All other components within normal limits  LIPASE, BLOOD  CBC  PREGNANCY, URINE    EKG None  Radiology CT ABDOMEN PELVIS WO CONTRAST  Result Date: 03/30/2023 CLINICAL DATA:  Right-sided abdominal pain with nausea EXAM: CT ABDOMEN AND PELVIS WITHOUT CONTRAST TECHNIQUE: Multidetector CT imaging of the abdomen and pelvis was performed following the standard protocol without IV contrast. RADIATION DOSE REDUCTION: This exam was performed according to the departmental dose-optimization program which includes automated exposure control, adjustment of the mA and/or kV according to patient size and/or use of iterative reconstruction technique. COMPARISON:  CT 01/10/2023 FINDINGS: Lower chest: No acute abnormality. Hepatobiliary: No focal liver abnormality is seen. Status post cholecystectomy. No biliary dilatation. Small right knee low meperidine Pancreas: Unremarkable. No pancreatic ductal dilatation or surrounding inflammatory changes. Spleen: Normal in size without focal abnormality. Adrenals/Urinary Tract: Adrenal glands are unremarkable. Kidneys are normal, focal lesion, or hydronephrosis. Bladder is unremarkable. Punctate nonobstructing left kidney stone. Stomach/Bowel: Stomach is within normal limits. Appendix appears  normal. No evidence of bowel wall thickening, distention, or inflammatory changes. Diverticular disease of left colon. Vascular/Lymphatic: No significant vascular findings are present. No enlarged abdominal or pelvic lymph nodes. Reproductive: Uterus unremarkable. No adnexal mass. Dislodged tubal ligation clips position between the uterus and bladder. Other: No abdominal wall hernia or abnormality. No abdominopelvic ascites. Musculoskeletal: No acute or significant osseous findings. IMPRESSION: 1. No CT evidence for acute intra-abdominal or pelvic abnormality. 2. Status post cholecystectomy. 3. Diverticular disease of left colon without acute inflammatory process. 4. Punctate nonobstructing left kidney stone. Electronically Signed   By: Jasmine Pang M.D.   On: 03/30/2023 21:10    Procedures Procedures    Medications Ordered in ED Medications  sodium chloride 0.9 % bolus 1,000 mL (1,000 mLs Intravenous New Bag/Given 03/30/23 2042)  ondansetron (ZOFRAN) injection 4 mg (4 mg Intravenous Given 03/30/23 2039)  fentaNYL (SUBLIMAZE) injection 50 mcg (50 mcg Intravenous Given 03/30/23  2039)  ketorolac (TORADOL) 30 MG/ML injection 30 mg (30 mg Intravenous Given 03/30/23 2124)   ED Course/ Medical Decision Making/ A&P   37 year old here for evaluation abdominal pain and nausea over the last 2 days.  Subjective fever at home however no documented fever.  She is afebrile, nonseptic, not ill-appearing.  Normal bowel movements not any blood.  No chest pain, shortness of breath, cough.  No URI symptoms to suggest infectious process, PE, dissection, pneumothorax.  Pain worse with movement.  Patient has negative Eulah Pont sign, McBurney point.  Negative CVA tap bilaterally.  No urinary symptoms.  No pelvic pain, vaginal discharge to suggest torsion, PID, ectopic pregnancy.  Treat symptomatically, plan on labs and imaging and reassess  Labs and imaging personally viewed  and interpreted:  CBC without leukocytosis,  metabolic panel potassium 3.3 Lipase 35 UA negative for infection, does show blood Preg neg CT abdomen and pelvis without any acute abnormality  Reassessed.  Pain controlled.  Reevaluation abdomen soft, nontender.  Unclear etiology of her symptoms.  Will have her follow-up outpatient, return for new or worsening symptoms.  Patient is nontoxic, nonseptic appearing, in no apparent distress.  Patient's pain and other symptoms adequately managed in emergency department.  Fluid bolus given.  Labs, imaging and vitals reviewed.  Patient does not meet the SIRS or Sepsis criteria.  On repeat exam patient does not have a surgical abdomin and there are no peritoneal signs.  No indication of appendicitis, bowel obstruction, bowel perforation, cholecystitis, diverticulitis, PID, torsion, ectopic pregnancy, AAA, dissection, thoracic etiology such as pneumonia, pneumothorax, PE.  Patient discharged home with symptomatic treatment and given strict instructions for follow-up with their primary care physician.  I have also discussed reasons to return immediately to the ER.  Patient expresses understanding and agrees with plan.                              Medical Decision Making Amount and/or Complexity of Data Reviewed Independent Historian: spouse External Data Reviewed: labs, radiology and notes. Labs: ordered. Decision-making details documented in ED Course. Radiology: ordered and independent interpretation performed. Decision-making details documented in ED Course.  Risk OTC drugs. Prescription drug management. Parenteral controlled substances. Decision regarding hospitalization. Diagnosis or treatment significantly limited by social determinants of health.          Final Clinical Impression(s) / ED Diagnoses Final diagnoses:  Generalized abdominal pain    Rx / DC Orders ED Discharge Orders          Ordered    dicyclomine (BENTYL) 20 MG tablet  2 times daily        03/30/23 2228     ondansetron (ZOFRAN-ODT) 4 MG disintegrating tablet  Every 8 hours PRN        03/30/23 2228              Barbie Croston A, PA-C 03/30/23 2229    Maia Plan, MD 04/03/23 1128

## 2023-03-30 NOTE — Discharge Instructions (Addendum)
Su tomografa computarizada es tranquilizadora.  Le hemos comenzado a tomar Bentyl, un medicamento para los espasmos del Aspers, y Zofran, un medicamento para las nuseas.  Tomar segn lo prescrito  Regrese si los sntomas son nuevos o Psychologist, prison and probation services

## 2023-03-30 NOTE — ED Triage Notes (Signed)
Patient has right abd pain for two days. She is having nausea and fever. She denied V/D.

## 2023-05-26 ENCOUNTER — Emergency Department (HOSPITAL_COMMUNITY)
Admission: EM | Admit: 2023-05-26 | Discharge: 2023-05-26 | Disposition: A | Discharge status: Home / Self Care | Payer: Self-pay | Attending: Emergency Medicine | Admitting: Emergency Medicine

## 2023-05-26 ENCOUNTER — Other Ambulatory Visit: Payer: Self-pay

## 2023-05-26 ENCOUNTER — Emergency Department (HOSPITAL_COMMUNITY): Payer: Self-pay

## 2023-05-26 ENCOUNTER — Encounter (HOSPITAL_COMMUNITY): Payer: Self-pay

## 2023-05-26 DIAGNOSIS — J45909 Unspecified asthma, uncomplicated: Secondary | ICD-10-CM | POA: Insufficient documentation

## 2023-05-26 DIAGNOSIS — R1010 Upper abdominal pain, unspecified: Secondary | ICD-10-CM | POA: Insufficient documentation

## 2023-05-26 DIAGNOSIS — E876 Hypokalemia: Secondary | ICD-10-CM | POA: Insufficient documentation

## 2023-05-26 DIAGNOSIS — R112 Nausea with vomiting, unspecified: Secondary | ICD-10-CM | POA: Insufficient documentation

## 2023-05-26 LAB — URINALYSIS, ROUTINE W REFLEX MICROSCOPIC
Bilirubin Urine: NEGATIVE
Glucose, UA: NEGATIVE mg/dL
Ketones, ur: NEGATIVE mg/dL
Leukocytes,Ua: NEGATIVE
Nitrite: NEGATIVE
Protein, ur: NEGATIVE mg/dL
Specific Gravity, Urine: 1.016 (ref 1.005–1.030)
pH: 7 (ref 5.0–8.0)

## 2023-05-26 LAB — CBC WITH DIFFERENTIAL/PLATELET
Abs Immature Granulocytes: 0.01 10*3/uL (ref 0.00–0.07)
Basophils Absolute: 0.1 10*3/uL (ref 0.0–0.1)
Basophils Relative: 1 %
Eosinophils Absolute: 0.2 10*3/uL (ref 0.0–0.5)
Eosinophils Relative: 2 %
HCT: 37.4 % (ref 36.0–46.0)
Hemoglobin: 12.5 g/dL (ref 12.0–15.0)
Immature Granulocytes: 0 %
Lymphocytes Relative: 44 %
Lymphs Abs: 3.3 10*3/uL (ref 0.7–4.0)
MCH: 30.7 pg (ref 26.0–34.0)
MCHC: 33.4 g/dL (ref 30.0–36.0)
MCV: 91.9 fL (ref 80.0–100.0)
Monocytes Absolute: 0.4 10*3/uL (ref 0.1–1.0)
Monocytes Relative: 5 %
Neutro Abs: 3.7 10*3/uL (ref 1.7–7.7)
Neutrophils Relative %: 48 %
Platelets: 288 10*3/uL (ref 150–400)
RBC: 4.07 MIL/uL (ref 3.87–5.11)
RDW: 11.9 % (ref 11.5–15.5)
WBC: 7.6 10*3/uL (ref 4.0–10.5)
nRBC: 0 % (ref 0.0–0.2)

## 2023-05-26 LAB — COMPREHENSIVE METABOLIC PANEL
ALT: 32 U/L (ref 0–44)
AST: 29 U/L (ref 15–41)
Albumin: 4 g/dL (ref 3.5–5.0)
Alkaline Phosphatase: 92 U/L (ref 38–126)
Anion gap: 11 (ref 5–15)
BUN: 18 mg/dL (ref 6–20)
CO2: 24 mmol/L (ref 22–32)
Calcium: 8.9 mg/dL (ref 8.9–10.3)
Chloride: 104 mmol/L (ref 98–111)
Creatinine, Ser: 1.02 mg/dL — ABNORMAL HIGH (ref 0.44–1.00)
GFR, Estimated: >60 mL/min (ref >60)
Glucose, Bld: 106 mg/dL — ABNORMAL HIGH (ref 70–99)
Potassium: 3.2 mmol/L — ABNORMAL LOW (ref 3.5–5.1)
Sodium: 139 mmol/L (ref 135–145)
Total Bilirubin: 0.4 mg/dL (ref 0.3–1.2)
Total Protein: 7.3 g/dL (ref 6.5–8.1)

## 2023-05-26 LAB — HCG, SERUM, QUALITATIVE: Preg, Serum: NEGATIVE

## 2023-05-26 LAB — LIPASE, BLOOD: Lipase: 34 U/L (ref 11–51)

## 2023-05-26 MED ORDER — ONDANSETRON 4 MG PO TBDP: 4.0000 mg | ORAL_TABLET | Freq: Three times a day (TID) | ORAL | 0 refills | Status: DC | PRN

## 2023-05-26 MED ORDER — POTASSIUM CHLORIDE CRYS ER 20 MEQ PO TBCR
20.0000 meq | EXTENDED_RELEASE_TABLET | Freq: Once | ORAL | Status: AC
  Administered 2023-05-26: 20 meq via ORAL
  Filled 2023-05-26: qty 1

## 2023-05-26 MED ORDER — PROMETHAZINE (PHENERGAN) 6.25MG IN NS 50ML IVPB
6.2500 mg | Freq: Four times a day (QID) | INTRAVENOUS | Status: DC | PRN
  Administered 2023-05-26: 6.25 mg via INTRAVENOUS
  Filled 2023-05-26: qty 6.25

## 2023-05-26 MED ORDER — KETOROLAC TROMETHAMINE 15 MG/ML IJ SOLN
15.0000 mg | Freq: Once | INTRAMUSCULAR | Status: AC
  Administered 2023-05-26: 15 mg via INTRAVENOUS
  Filled 2023-05-26: qty 1

## 2023-05-26 MED ORDER — ONDANSETRON HCL 4 MG/2ML IJ SOLN
4.0000 mg | Freq: Once | INTRAMUSCULAR | Status: AC
  Administered 2023-05-26: 4 mg via INTRAVENOUS
  Filled 2023-05-26: qty 2

## 2023-05-26 MED ORDER — ALUM & MAG HYDROXIDE-SIMETH 200-200-20 MG/5ML PO SUSP
30.0000 mL | Freq: Once | ORAL | Status: AC
  Administered 2023-05-26: 30 mL via ORAL
  Filled 2023-05-26: qty 30

## 2023-05-26 MED ORDER — HYDROMORPHONE HCL 1 MG/ML IJ SOLN
1.0000 mg | Freq: Once | INTRAMUSCULAR | Status: AC
  Administered 2023-05-26: 1 mg via INTRAVENOUS
  Filled 2023-05-26: qty 1

## 2023-05-26 NOTE — ED Triage Notes (Signed)
Spanish interpreter used for triage.   Says she has had RUQ abdominal pain x 1 week. Pain usually manifesting after eating a meal.   Also admits to n/v/d. Last episode of vomiting was just prior to arrival.

## 2023-05-26 NOTE — Discharge Instructions (Signed)
It was a pleasure taking care of you today.  As discussed, your CT scan was reassuring.  Your labs are reassuring.  I am sending you home with nausea medication.  Take as needed.  I have included the number of the GI doctor.  Please call to schedule an appointment for further evaluation of your chronic abdominal pain.  Return to the ER for new or worsening symptoms.

## 2023-05-26 NOTE — ED Provider Notes (Signed)
[place] [facility] AT [facility] Provider Note   CSN: [id number] Arrival date & time: 05/26/23  8657     History  Chief Complaint  Patient presents with   Abdominal Pain    Kevisha Sobon is a 37 y.o. female with a past medical history significant for history of pancreatitis, asthma, history of diverticulitis, and obesity who presents to the ED due to upper abdominal pain x 1 week.  Patient she has been unable to tolerate p.o. due to significant pain.  Pain worse after eating or drinking.  Denies alcohol use.  No history of chronic NSAIDs.  Denies melena, hematemesis, hematochezia.  No urinary or vaginal symptoms.  Admits to feeling warm however, has not checked her temperature.  Previous cholecystectomy. Denies chest pain and shortness of breath.   History obtained from patient and past medical records. Official spanish interpreter used during entire encounter.       Home Medications Prior to Admission medications   Medication Sig Start Date End Date Taking? Authorizing Provider  albuterol (VENTOLIN HFA) 108 (90 Base) MCG/ACT inhaler Inhale 2 puffs into the lungs every 4 (four) hours as needed for wheezing or shortness of breath. 08/25/22  Yes Bethann Berkshire, MD  ondansetron (ZOFRAN-ODT) 4 MG disintegrating tablet Take 1 tablet (4 mg total) by mouth every 8 (eight) hours as needed for nausea or vomiting. 05/26/23  Yes Laytoya Ion, Merla Riches, PA-C  dicyclomine (BENTYL) 20 MG tablet Take 1 tablet (20 mg total) by mouth 2 (two) times daily. Patient not taking: Reported on 05/26/2023 03/30/23   Henderly, Britni A, PA-C  omeprazole (PRILOSEC) 20 MG capsule Take 1 capsule (20 mg total) by mouth daily. Patient not taking: Reported on 05/26/2023 11/09/22   Army Melia A, PA-C  ondansetron (ZOFRAN-ODT) 4 MG disintegrating tablet Take 1 tablet (4 mg total) by mouth every 8 (eight) hours as needed for nausea or vomiting. Patient not taking: Reported on 05/26/2023 03/30/23    Henderly, Britni A, PA-C  cetirizine (ZYRTEC ALLERGY) 10 MG tablet Take 1 tablet (10 mg total) by mouth 2 (two) times daily. Patient not taking: Reported on 12/06/2020 11/12/20 01/24/21  Muthersbaugh, Dahlia Client, PA-C      Allergies    Iodinated contrast media, Lupine bean extract, Ceftriaxone, and Morphine    Review of Systems   Review of Systems  Constitutional:  Positive for fever.  Respiratory:  Negative for shortness of breath.   Cardiovascular:  Negative for chest pain.  Gastrointestinal:  Positive for abdominal pain, nausea and vomiting.  Genitourinary:  Negative for dysuria.    Physical Exam Updated Vital Signs BP 116/75   Pulse 67   Temp 98 F (36.7 C) (Oral)   Resp 17   Ht 5' (1.524 m)   Wt 77.1 kg   LMP  (LMP Unknown)   SpO2 100%   BMI 33.20 kg/m  Physical Exam Vitals and nursing note reviewed.  Constitutional:      General: She is not in acute distress.    Appearance: She is not ill-appearing.  HENT:     Head: Normocephalic.  Eyes:     Pupils: Pupils are equal, round, and reactive to light.  Cardiovascular:     Rate and Rhythm: Normal rate and regular rhythm.     Pulses: Normal pulses.     Heart sounds: Normal heart sounds. No murmur heard.    No friction rub. No gallop.  Pulmonary:     Effort: Pulmonary effort is normal.  Breath sounds: Normal breath sounds.  Abdominal:     General: Abdomen is flat. There is no distension.     Palpations: Abdomen is soft.     Tenderness: There is abdominal tenderness. There is no guarding or rebound.     Comments: Upper abdominal tenderness  Musculoskeletal:        General: Normal range of motion.     Cervical back: Neck supple.  Skin:    General: Skin is warm and dry.  Neurological:     General: No focal deficit present.     Mental Status: She is alert.  Psychiatric:        Mood and Affect: Mood normal.        Behavior: Behavior normal.     ED Results / Procedures / Treatments   Labs (all labs ordered are  listed, but only abnormal results are displayed) Labs Reviewed  COMPREHENSIVE METABOLIC PANEL - Abnormal; Notable for the following components:      Result Value   Potassium 3.2 (*)    Glucose, Bld 106 (*)    Creatinine, Ser 1.02 (*)    All other components within normal limits  URINALYSIS, ROUTINE W REFLEX MICROSCOPIC - Abnormal; Notable for the following components:   APPearance CLOUDY (*)    Hgb urine dipstick LARGE (*)    Bacteria, UA RARE (*)    Non Squamous Epithelial 0-5 (*)    All other components within normal limits  LIPASE, BLOOD  HCG, SERUM, QUALITATIVE  CBC WITH DIFFERENTIAL/PLATELET    EKG EKG Interpretation Date/Time:  Sunday May 26 2023 07:31:37 EDT Ventricular Rate:  79 PR Interval:  150 QRS Duration:  103 QT Interval:  396 QTC Calculation: 454 R Axis:   47  Text Interpretation: Sinus rhythm Low voltage, precordial leads Confirmed by Kristine Royal [phone]) on 05/26/2023 8:22:55 AM  Radiology CT ABDOMEN PELVIS WO CONTRAST  Result Date: 05/26/2023 CLINICAL DATA:  37 year old female with history of acute onset of severe pancreatitis. EXAM: CT ABDOMEN AND PELVIS WITHOUT CONTRAST TECHNIQUE: Multidetector CT imaging of the abdomen and pelvis was performed following the standard protocol without IV contrast. RADIATION DOSE REDUCTION: This exam was performed according to the departmental dose-optimization program which includes automated exposure control, adjustment of the mA and/or kV according to patient size and/or use of iterative reconstruction technique. COMPARISON:  CT of the abdomen and pelvis 03/30/2023. FINDINGS: Lower chest: Unremarkable. Hepatobiliary: No definite suspicious cystic or solid hepatic lesions are confidently identified on today's noncontrast CT examination. Status post cholecystectomy. Pancreas: No pancreatic mass or peripancreatic fluid collections or inflammatory changes are confidently identified on today's noncontrast CT examination. Spleen:  Unremarkable. Adrenals/Urinary Tract: There are no abnormal calcifications within the collecting system of either kidney, along the course of either ureter, or within the lumen of the urinary bladder. No hydroureteronephrosis or perinephric stranding to suggest urinary tract obstruction at this time. The unenhanced appearance of the kidneys is unremarkable bilaterally. Unenhanced appearance of the urinary bladder is unremarkable. Bilateral adrenal glands are normal in appearance. Stomach/Bowel: The appearance of the stomach is normal. No pathologic dilatation of small bowel or colon. Numerous colonic diverticula are noted, without surrounding inflammatory changes to suggest an acute diverticulitis at this time. Normal appendix. Vascular/Lymphatic: No atherosclerotic calcifications are noted in the abdominal aorta or pelvic vasculature. Reproductive: Uterus and ovaries are unremarkable in appearance. Other: 2 surgical clips are noted in the low anatomic pelvis between the uterus and urinary bladder, similar to the prior study. No significant  volume of ascites. No pneumoperitoneum. Musculoskeletal: There are no aggressive appearing lytic or blastic lesions noted in the visualized portions of the skeleton. IMPRESSION: 1. No acute findings are noted in the abdomen or pelvis. Specifically, no imaging findings of complicated pancreatitis on today's noncontrast CT examination. 2. Colonic diverticulosis without evidence of acute diverticulitis at this time. Electronically Signed   By: Trudie Reed M.D.   On: 05/26/2023 09:11    Procedures Procedures    Medications Ordered in ED Medications  promethazine (PHENERGAN) 6.25 mg/NS 50 mL IVPB (6.25 mg Intravenous New Bag/Given 05/26/23 1256)  ondansetron (ZOFRAN) injection 4 mg (4 mg Intravenous Given 05/26/23 0717)  HYDROmorphone (DILAUDID) injection 1 mg (1 mg Intravenous Given 05/26/23 0717)  alum & mag hydroxide-simeth (MAALOX/MYLANTA) 200-200-20 MG/5ML suspension 30  mL (30 mLs Oral Given 05/26/23 0940)  ketorolac (TORADOL) 15 MG/ML injection 15 mg (15 mg Intravenous Given 05/26/23 1031)  potassium chloride SA (KLOR-CON M) CR tablet 20 mEq (20 mEq Oral Given 05/26/23 1031)    ED Course/ Medical Decision Making/ A&P Clinical Course as of 05/26/23 1355  Sun May 26, 2023  1610 Reassessed patient at bedside.  Patient still admits to significant pain.  Dose of Toradol given.  Will reassess.  CT reassuring. [CA]  1219 Reassessed patient at bedside.  Patient admits to continued nausea and vomiting.  Awaiting Phenergan from pharmacy. [CA]    Clinical Course User Index [CA] Mannie Stabile, PA-C                                 Medical Decision Making Amount and/or Complexity of Data Reviewed External Data Reviewed: notes. Labs: ordered. Decision-making details documented in ED Course. Radiology: ordered and independent interpretation performed. Decision-making details documented in ED Course. ECG/medicine tests: ordered and independent interpretation performed. Decision-making details documented in ED Course.  Risk OTC drugs. Prescription drug management.   This patient presents to the ED for concern of upper abdominal pain, this involves an extensive number of treatment options, and is a complaint that carries with it a high risk of complications and morbidity.  The differential diagnosis includes pancreatitis, gastritis, ACS, gastroenteritis, etc  38 year old female presents to the ED due to upper abdominal pain x 1 week.  Unable to tolerate p.o. due to significant pain.  Pain worse after eating.  Previous cholecystectomy.  Admits to feeling warm however, no documented fever.  Upon arrival, vitals all within normal limits.  Patient appears uncomfortable in bed during initial evaluation.  Abdomen soft, nondistended with upper abdominal tenderness.  Routine labs ordered.  CT abdomen ordered. Previous cholecystectomy.   CBC unremarkable.  No leukocytosis.   Normal hemoglobin.  Lipase normal.  Low suspicion for pancreatitis.  CMP significant for hypokalemia at 3.2.  Mild elevation creatinine 1.02.  Pregnancy test negative.  Low suspicion for ectopic pregnancy.  UA significant for large hematuria.  Rare bacteria.  No leukocytes or nitrites.  CT abdomen personally reviewed and interpreted which is negative for any acute abnormalities.  No evidence of diverticulitis or pancreatitis.  EKG demonstrates normal sinus rhythm.  No signs of acute ischemia.  Low suspicion for atypical ACS.  Upon reassessment patient able to tolerate p.o.  Patient admits to some improvement in pain.  Unclear etiology of symptoms.  Gastritis? Per chart review, appears patient has more chronic abdominal pain.  Patient stable for discharge.  Will refer to GI for further evaluation due to chronic  abdominal pain. Strict ED precautions discussed with patient. Patient states understanding and agrees to plan. Patient discharged home in no acute distress and stable vitals  No PCP Lives at home Hx asthma, pancreatitis       Final Clinical Impression(s) / ED Diagnoses Final diagnoses:  Pain of upper abdomen  Nausea and vomiting, unspecified vomiting type    Rx / DC Orders ED Discharge Orders          Ordered    ondansetron (ZOFRAN-ODT) 4 MG disintegrating tablet  Every 8 hours PRN        05/26/23 1354              Mannie Stabile, PA-C 05/26/23 1400    Wynetta Fines, MD 05/26/23 1513

## 2023-05-26 NOTE — ED Notes (Signed)
Patient stated that she is unable to eat but was able to drink a small amount of water. But also got sick after water.

## 2023-06-03 ENCOUNTER — Emergency Department (HOSPITAL_COMMUNITY)
Admission: EM | Admit: 2023-06-03 | Discharge: 2023-06-03 | Disposition: A | Discharge status: Home / Self Care | Payer: Self-pay | Attending: Emergency Medicine | Admitting: Emergency Medicine

## 2023-06-03 ENCOUNTER — Emergency Department (HOSPITAL_COMMUNITY): Payer: Self-pay

## 2023-06-03 ENCOUNTER — Encounter (HOSPITAL_COMMUNITY): Payer: Self-pay

## 2023-06-03 ENCOUNTER — Other Ambulatory Visit: Payer: Self-pay

## 2023-06-03 DIAGNOSIS — R1084 Generalized abdominal pain: Secondary | ICD-10-CM

## 2023-06-03 DIAGNOSIS — N390 Urinary tract infection, site not specified: Secondary | ICD-10-CM | POA: Insufficient documentation

## 2023-06-03 LAB — CBC
HCT: 37.7 % (ref 36.0–46.0)
Hemoglobin: 12 g/dL (ref 12.0–15.0)
MCH: 30.8 pg (ref 26.0–34.0)
MCHC: 31.8 g/dL (ref 30.0–36.0)
MCV: 96.7 fL (ref 80.0–100.0)
Platelets: 191 10*3/uL (ref 150–400)
RBC: 3.9 MIL/uL (ref 3.87–5.11)
RDW: 11.9 % (ref 11.5–15.5)
WBC: 10.7 10*3/uL — ABNORMAL HIGH (ref 4.0–10.5)
nRBC: 0 % (ref 0.0–0.2)

## 2023-06-03 LAB — COMPREHENSIVE METABOLIC PANEL
ALT: 22 U/L (ref 0–44)
AST: 20 U/L (ref 15–41)
Albumin: 4 g/dL (ref 3.5–5.0)
Alkaline Phosphatase: 89 U/L (ref 38–126)
Anion gap: 8 (ref 5–15)
BUN: 14 mg/dL (ref 6–20)
CO2: 21 mmol/L — ABNORMAL LOW (ref 22–32)
Calcium: 8.6 mg/dL — ABNORMAL LOW (ref 8.9–10.3)
Chloride: 108 mmol/L (ref 98–111)
Creatinine, Ser: 0.73 mg/dL (ref 0.44–1.00)
GFR, Estimated: >60 mL/min (ref >60)
Glucose, Bld: 89 mg/dL (ref 70–99)
Potassium: 3.1 mmol/L — ABNORMAL LOW (ref 3.5–5.1)
Sodium: 137 mmol/L (ref 135–145)
Total Bilirubin: 0.4 mg/dL (ref 0.3–1.2)
Total Protein: 7.3 g/dL (ref 6.5–8.1)

## 2023-06-03 LAB — URINALYSIS, ROUTINE W REFLEX MICROSCOPIC
Bilirubin Urine: NEGATIVE
Glucose, UA: NEGATIVE mg/dL
Hgb urine dipstick: NEGATIVE
Ketones, ur: NEGATIVE mg/dL
Nitrite: NEGATIVE
Protein, ur: NEGATIVE mg/dL
Specific Gravity, Urine: 1.021 (ref 1.005–1.030)
pH: 5 (ref 5.0–8.0)

## 2023-06-03 LAB — LIPASE, BLOOD: Lipase: 31 U/L (ref 11–51)

## 2023-06-03 LAB — PREGNANCY, URINE: Preg Test, Ur: NEGATIVE

## 2023-06-03 MED ORDER — SODIUM CHLORIDE 0.9 % IV BOLUS
1000.0000 mL | Freq: Once | INTRAVENOUS | Status: AC
  Administered 2023-06-03: 1000 mL via INTRAVENOUS

## 2023-06-03 MED ORDER — ONDANSETRON HCL 4 MG PO TABS: 4.0000 mg | ORAL_TABLET | Freq: Four times a day (QID) | ORAL | 0 refills | Status: DC

## 2023-06-03 MED ORDER — NITROFURANTOIN MONOHYD MACRO 100 MG PO CAPS: 100.0000 mg | ORAL_CAPSULE | Freq: Two times a day (BID) | ORAL | 0 refills | Status: DC

## 2023-06-03 MED ORDER — MORPHINE SULFATE (PF) 4 MG/ML IV SOLN
4.0000 mg | Freq: Once | INTRAVENOUS | Status: AC
  Administered 2023-06-03: 4 mg via INTRAVENOUS
  Filled 2023-06-03: qty 1

## 2023-06-03 MED ORDER — ONDANSETRON HCL 4 MG/2ML IJ SOLN
4.0000 mg | Freq: Once | INTRAMUSCULAR | Status: AC
  Administered 2023-06-03: 4 mg via INTRAVENOUS
  Filled 2023-06-03: qty 2

## 2023-06-03 NOTE — ED Triage Notes (Signed)
Pt complaining of a left sided abd pain that increases in pain with movement. States she has had this pian since last night, does endorse nausea/vomiting.

## 2023-06-03 NOTE — Discharge Instructions (Addendum)
Fue atendido en urgencias por dolor abdominal. La tomografa computarizada no mostr ninguna evidencia de infeccin ahora, pero s mostr diverticulosis, que es una condicin crnica. Es posible que tengas una infeccin del tracto urinario. Por este motivo te administramos una dosis de antibiticos en urgencias. Deber recoger la receta de antibiticos y tomarlos segn las indicaciones para tratar la infeccin del tracto urinario. Regrese al departamento de emergencias si tiene dolor intenso, si no puede comer ni beber o tiene alguna otra inquietud. De lo contrario, haga un seguimiento con su mdico de atencin primaria en 1 semana para una reevaluacin.

## 2023-06-03 NOTE — ED Provider Notes (Signed)
[place] EMERGENCY DEPARTMENT AT [facility] Provider Note   CSN: [id number] Arrival date & time: 06/03/23  4098     History  Chief Complaint  Patient presents with   Abdominal Pain    Lorraine [name] is a 37 y.o. female.  With a past medical history of diverticulitis, acute pancreatitis, hepatic steatosis and GERD who presents to the ED for abdominal pain.  Beginning last night after dinner she began to experience left-sided abdominal pain which is made worse with movement.  Associated symptoms include nausea and vomiting.  No urinary symptoms or changes in bowel habits.  Denies fevers, chills, chest pain shortness of breath.  Feels similar to prior episodes of diverticulitis   Abdominal Pain      Home Medications Prior to Admission medications   Medication Sig Start Date End Date Taking? Authorizing Provider  nitrofurantoin, macrocrystal-monohydrate, (MACROBID) 100 MG capsule Take 1 capsule (100 mg total) by mouth 2 (two) times daily. 06/03/23  Yes Estelle June A, DO  ondansetron (ZOFRAN) 4 MG tablet Take 1 tablet (4 mg total) by mouth every 6 (six) hours. 06/03/23  Yes Royanne Foots, DO  albuterol (VENTOLIN HFA) 108 (90 Base) MCG/ACT inhaler Inhale 2 puffs into the lungs every 4 (four) hours as needed for wheezing or shortness of breath. 08/25/22   Bethann Berkshire, MD  dicyclomine (BENTYL) 20 MG tablet Take 1 tablet (20 mg total) by mouth 2 (two) times daily. Patient not taking: Reported on 05/26/2023 03/30/23   Henderly, Britni A, PA-C  omeprazole (PRILOSEC) 20 MG capsule Take 1 capsule (20 mg total) by mouth daily. Patient not taking: Reported on 05/26/2023 11/09/22   Army Melia A, PA-C  ondansetron (ZOFRAN-ODT) 4 MG disintegrating tablet Take 1 tablet (4 mg total) by mouth every 8 (eight) hours as needed for nausea or vomiting. Patient not taking: Reported on 05/26/2023 03/30/23   Henderly, Britni A, PA-C  ondansetron (ZOFRAN-ODT) 4 MG disintegrating tablet Take 1  tablet (4 mg total) by mouth every 8 (eight) hours as needed for nausea or vomiting. 05/26/23   Mannie Stabile, PA-C  cetirizine (ZYRTEC ALLERGY) 10 MG tablet Take 1 tablet (10 mg total) by mouth 2 (two) times daily. Patient not taking: Reported on 12/06/2020 11/12/20 01/24/21  Muthersbaugh, Dahlia Client, PA-C      Allergies    Iodinated contrast media, Lupine bean extract, Ceftriaxone, and Morphine    Review of Systems   Review of Systems  Gastrointestinal:  Positive for abdominal pain.  All other systems reviewed and are negative.   Physical Exam Updated Vital Signs BP 126/88   Pulse 72   Temp 98.2 F (36.8 C)   Resp 18   Ht 5' (1.524 m)   Wt 77.1 kg   LMP  (LMP Unknown)   SpO2 100%   BMI 33.20 kg/m  Physical Exam Vitals and nursing note reviewed.  HENT:     Head: Normocephalic and atraumatic.  Eyes:     Pupils: Pupils are equal, round, and reactive to light.  Cardiovascular:     Rate and Rhythm: Normal rate and regular rhythm.  Pulmonary:     Effort: Pulmonary effort is normal.     Breath sounds: Normal breath sounds.  Abdominal:     Palpations: Abdomen is soft.     Tenderness: There is abdominal tenderness in the suprapubic area and left lower quadrant. There is no guarding or rebound.  Skin:    General: Skin is warm and dry.  Neurological:  Mental Status: She is alert.  Psychiatric:        Mood and Affect: Mood normal.     ED Results / Procedures / Treatments   Labs (all labs ordered are listed, but only abnormal results are displayed) Labs Reviewed  COMPREHENSIVE METABOLIC PANEL - Abnormal; Notable for the following components:      Result Value   Potassium 3.1 (*)    CO2 21 (*)    Calcium 8.6 (*)    All other components within normal limits  CBC - Abnormal; Notable for the following components:   WBC 10.7 (*)    All other components within normal limits  URINALYSIS, ROUTINE W REFLEX MICROSCOPIC - Abnormal; Notable for the following components:    Leukocytes,Ua TRACE (*)    Bacteria, UA RARE (*)    All other components within normal limits  LIPASE, BLOOD  PREGNANCY, URINE  HCG, SERUM, QUALITATIVE    EKG None  Radiology CT ABDOMEN PELVIS WO CONTRAST  Result Date: 06/03/2023 CLINICAL DATA:  Intra-abdominal infection/peritonitis Left-sided abdominal pain, which increases with movement. EXAM: CT ABDOMEN AND PELVIS WITHOUT CONTRAST TECHNIQUE: Multidetector CT imaging of the abdomen and pelvis was performed following the standard protocol without IV contrast. RADIATION DOSE REDUCTION: This exam was performed according to the departmental dose-optimization program which includes automated exposure control, adjustment of the mA and/or kV according to patient size and/or use of iterative reconstruction technique. COMPARISON:  05/26/2023 FINDINGS: Lower chest: No acute abnormality. Hepatobiliary: No focal liver abnormality is seen. No gallstones, gallbladder wall thickening, or biliary dilatation. Pancreas: Unremarkable. No pancreatic ductal dilatation or surrounding inflammatory changes. Spleen: Normal in size without focal abnormality. Adrenals/Urinary Tract: Adrenal glands are unremarkable. Kidneys are normal, without renal calculi, focal lesion, or hydronephrosis. Bladder is unremarkable. Stomach/Bowel: Diffuse diverticulosis of the descending and sigmoid colon without evidence of acute diverticulitis. No bowel dilatation to indicate ileus or obstruction. Appendix is normal. Vascular/Lymphatic: No significant vascular findings are present. No enlarged abdominal or pelvic lymph nodes. Reproductive: Multiple surgical clips again seen in the left adnexa. Uterus and right adnexa are unremarkable. Other: No abdominal wall hernia or abnormality. No abdominopelvic ascites. Musculoskeletal: No acute or significant osseous findings. IMPRESSION: 1. No acute abnormality of the abdomen or pelvis. 2. Diffuse diverticulosis of the descending and sigmoid colon  without evidence of acute diverticulitis. Electronically Signed   By: Acquanetta Belling M.D.   On: 06/03/2023 14:15    Procedures Procedures    Medications Ordered in ED Medications  sodium chloride 0.9 % bolus 1,000 mL (0 mLs Intravenous Stopped 06/03/23 1135)  ondansetron (ZOFRAN) injection 4 mg (4 mg Intravenous Given 06/03/23 0953)  morphine (PF) 4 MG/ML injection 4 mg (4 mg Intravenous Given 06/03/23 0981)    ED Course/ Medical Decision Making/ A&P Clinical Course as of 06/03/23 1455  Mon Jun 03, 2023  1019 Pregnancy, urine [MP]  1155 Reviewed laboratory workup.  Pregnancy test negative.  Urinalysis shows trace leukocytes and white cells we will send for culture.  Will obtain CT abdomen pelvis [MP]  1450 CT abdomen pelvis shows chronic diverticulosis but no evidence of acute diverticulitis or other concerning findings.  Reassessed patient.  She does now report some burning with urination.  Will treat empirically for UTI and send for culture.  Abdominal pain slightly better than earlier.  She was able to tolerate p.o. intake.  Will discharge instructions for PCP follow-up as well [MP]    Clinical Course User Index [MP] Royanne Foots, DO  Medical Decision Making This patient presents to the ED for concern of abdominal pain, this involves an extensive number of treatment options, and is a complaint that carries with it a high risk of complications and morbidity.  The differential diagnosis includes diverticulitis, appendicitis, cholecystitis, UTI, gastritis and viral syndrome   Co morbidities that complicate the patient evaluation  History of diverticulitis   Additional history obtained:  Additional history obtained from EMR    Lab Tests:  I Ordered, and personally interpreted labs.  The pertinent results include: UA concerning for UTI will send for culture.  Pregnancy test negative   Imaging Studies ordered:  I ordered imaging studies  including CT abdomen pelvis I independently visualized and interpreted imaging which showed diverticulosis I agree with the radiologist interpretation         Amount and/or Complexity of Data Reviewed Labs: ordered. Decision-making details documented in ED Course. Radiology: ordered.  Risk Prescription drug management.           Final Clinical Impression(s) / ED Diagnoses Final diagnoses:  Lower urinary tract infectious disease  Generalized abdominal pain    Rx / DC Orders ED Discharge Orders          Ordered    nitrofurantoin, macrocrystal-monohydrate, (MACROBID) 100 MG capsule  2 times daily        06/03/23 1454    ondansetron (ZOFRAN) 4 MG tablet  Every 6 hours        06/03/23 1455              Estelle June A, DO 06/03/23 1456

## 2023-06-03 NOTE — ED Notes (Signed)
IV attempted x1 no success

## 2023-06-03 NOTE — ED Notes (Signed)
Pt ambulatory to bathroom w/o assist. Steady gait

## 2023-07-19 ENCOUNTER — Emergency Department (HOSPITAL_COMMUNITY)
Admission: EM | Admit: 2023-07-19 | Discharge: 2023-07-19 | Discharge status: Against Medical Advice | Payer: Self-pay | Attending: Emergency Medicine | Admitting: Emergency Medicine

## 2023-07-19 ENCOUNTER — Encounter (HOSPITAL_COMMUNITY): Payer: Self-pay | Admitting: Emergency Medicine

## 2023-07-19 ENCOUNTER — Other Ambulatory Visit: Payer: Self-pay

## 2023-07-19 ENCOUNTER — Emergency Department (HOSPITAL_COMMUNITY): Payer: Self-pay

## 2023-07-19 DIAGNOSIS — R509 Fever, unspecified: Secondary | ICD-10-CM | POA: Insufficient documentation

## 2023-07-19 DIAGNOSIS — R0602 Shortness of breath: Secondary | ICD-10-CM | POA: Insufficient documentation

## 2023-07-19 DIAGNOSIS — Z5321 Procedure and treatment not carried out due to patient leaving prior to being seen by health care provider: Secondary | ICD-10-CM | POA: Insufficient documentation

## 2023-07-19 DIAGNOSIS — R079 Chest pain, unspecified: Secondary | ICD-10-CM | POA: Insufficient documentation

## 2023-07-19 LAB — CBC
HCT: 43.5 % (ref 36.0–46.0)
Hemoglobin: 14.4 g/dL (ref 12.0–15.0)
MCH: 30.7 pg (ref 26.0–34.0)
MCHC: 33.1 g/dL (ref 30.0–36.0)
MCV: 92.8 fL (ref 80.0–100.0)
Platelets: 316 10*3/uL (ref 150–400)
RBC: 4.69 MIL/uL (ref 3.87–5.11)
RDW: 12.4 % (ref 11.5–15.5)
WBC: 8.3 10*3/uL (ref 4.0–10.5)
nRBC: 0 % (ref 0.0–0.2)

## 2023-07-19 LAB — HCG, SERUM, QUALITATIVE: Preg, Serum: NEGATIVE

## 2023-07-19 LAB — BASIC METABOLIC PANEL
Anion gap: 14 (ref 5–15)
BUN: 17 mg/dL (ref 6–20)
CO2: 21 mmol/L — ABNORMAL LOW (ref 22–32)
Calcium: 9.9 mg/dL (ref 8.9–10.3)
Chloride: 102 mmol/L (ref 98–111)
Creatinine, Ser: 0.79 mg/dL (ref 0.44–1.00)
GFR, Estimated: >60 mL/min (ref >60)
Glucose, Bld: 99 mg/dL (ref 70–99)
Potassium: 3.2 mmol/L — ABNORMAL LOW (ref 3.5–5.1)
Sodium: 137 mmol/L (ref 135–145)

## 2023-07-19 LAB — TROPONIN I (HIGH SENSITIVITY): Troponin I (High Sensitivity): 2 ng/L (ref <18)

## 2023-07-19 NOTE — ED Triage Notes (Signed)
Patient presents with chest pain and right side pain since yesterday. She also endorses shortness and breath and fever.

## 2023-07-25 ENCOUNTER — Encounter (HOSPITAL_COMMUNITY): Payer: Self-pay

## 2023-07-25 ENCOUNTER — Emergency Department (HOSPITAL_COMMUNITY)
Admission: EM | Admit: 2023-07-25 | Discharge: 2023-07-25 | Disposition: A | Discharge status: Home / Self Care | Payer: Self-pay

## 2023-07-25 DIAGNOSIS — R1010 Upper abdominal pain, unspecified: Secondary | ICD-10-CM | POA: Insufficient documentation

## 2023-07-25 DIAGNOSIS — R109 Unspecified abdominal pain: Secondary | ICD-10-CM

## 2023-07-25 LAB — URINALYSIS, ROUTINE W REFLEX MICROSCOPIC
Bilirubin Urine: NEGATIVE
Glucose, UA: NEGATIVE mg/dL
Hgb urine dipstick: NEGATIVE
Ketones, ur: NEGATIVE mg/dL
Leukocytes,Ua: NEGATIVE
Nitrite: NEGATIVE
Protein, ur: NEGATIVE mg/dL
Specific Gravity, Urine: 1.026 (ref 1.005–1.030)
pH: 5 (ref 5.0–8.0)

## 2023-07-25 LAB — CBC
HCT: 38.4 % (ref 36.0–46.0)
Hemoglobin: 12.9 g/dL (ref 12.0–15.0)
MCH: 30.9 pg (ref 26.0–34.0)
MCHC: 33.6 g/dL (ref 30.0–36.0)
MCV: 91.9 fL (ref 80.0–100.0)
Platelets: 304 10*3/uL (ref 150–400)
RBC: 4.18 MIL/uL (ref 3.87–5.11)
RDW: 12.1 % (ref 11.5–15.5)
WBC: 8.2 10*3/uL (ref 4.0–10.5)
nRBC: 0 % (ref 0.0–0.2)

## 2023-07-25 LAB — COMPREHENSIVE METABOLIC PANEL
ALT: 20 U/L (ref 0–44)
AST: 20 U/L (ref 15–41)
Albumin: 4.3 g/dL (ref 3.5–5.0)
Alkaline Phosphatase: 103 U/L (ref 38–126)
Anion gap: 10 (ref 5–15)
BUN: 18 mg/dL (ref 6–20)
CO2: 24 mmol/L (ref 22–32)
Calcium: 9.3 mg/dL (ref 8.9–10.3)
Chloride: 107 mmol/L (ref 98–111)
Creatinine, Ser: 0.61 mg/dL (ref 0.44–1.00)
GFR, Estimated: >60 mL/min (ref >60)
Glucose, Bld: 103 mg/dL — ABNORMAL HIGH (ref 70–99)
Potassium: 3.2 mmol/L — ABNORMAL LOW (ref 3.5–5.1)
Sodium: 141 mmol/L (ref 135–145)
Total Bilirubin: 0.5 mg/dL (ref <1.2)
Total Protein: 8 g/dL (ref 6.5–8.1)

## 2023-07-25 LAB — POC URINE PREG, ED: Preg Test, Ur: NEGATIVE

## 2023-07-25 LAB — LIPASE, BLOOD: Lipase: 29 U/L (ref 11–51)

## 2023-07-25 MED ORDER — PANTOPRAZOLE SODIUM 20 MG PO TBEC: 20.0000 mg | DELAYED_RELEASE_TABLET | Freq: Every day | ORAL | 0 refills | Status: DC

## 2023-07-25 MED ORDER — ALUM & MAG HYDROXIDE-SIMETH 200-200-20 MG/5ML PO SUSP
30.0000 mL | Freq: Once | ORAL | Status: AC
  Administered 2023-07-25: 30 mL via ORAL
  Filled 2023-07-25: qty 30

## 2023-07-25 MED ORDER — SUCRALFATE 1 G PO TABS
1.0000 g | ORAL_TABLET | Freq: Once | ORAL | Status: AC
  Administered 2023-07-25: 1 g via ORAL
  Filled 2023-07-25: qty 1

## 2023-07-25 MED ORDER — FAMOTIDINE 20 MG PO TABS
20.0000 mg | ORAL_TABLET | Freq: Once | ORAL | Status: AC
  Administered 2023-07-25: 20 mg via ORAL
  Filled 2023-07-25: qty 1

## 2023-07-25 MED ORDER — SUCRALFATE 1 G PO TABS: 1.0000 g | ORAL_TABLET | Freq: Three times a day (TID) | ORAL | 0 refills | Status: DC

## 2023-07-25 MED ORDER — ONDANSETRON HCL 4 MG PO TABS
4.0000 mg | ORAL_TABLET | Freq: Once | ORAL | Status: AC
  Administered 2023-07-25: 4 mg via ORAL
  Filled 2023-07-25: qty 1

## 2023-07-25 NOTE — ED Provider Triage Note (Signed)
Emergency Medicine Provider Triage Evaluation Note  Lorraine [name] , a 37 y.o. female  was evaluated in triage.  Pt complains of upper abdominal discomfort.    Review of Systems  Positive: pain Negative: fever  Physical Exam  BP (!) 137/95 (BP Location: Left Arm)   Pulse 75   Temp 98 F (36.7 C) (Oral)   Resp 18   LMP 07/22/2023 (Within Days)   SpO2 100%  Gen:   Awake, no distress   Resp:  Normal effort  MSK:   Moves extremities without difficulty  Other:    Medical Decision Making  Medically screening exam initiated at 2:11 PM.  Appropriate orders placed.  Lorraine [name] was informed that the remainder of the evaluation will be completed by another provider, this initial triage assessment does not replace that evaluation, and the importance of remaining in the ED until their evaluation is complete.     Elson Areas, [place] 07/25/23 1412

## 2023-07-25 NOTE — ED Triage Notes (Signed)
Pt presents with c/o left side abdominal pain that radiates to the left flank area. Pt reports the pain started last night.

## 2023-07-25 NOTE — Discharge Instructions (Addendum)
Please follow-up with the stomach doctor.  Return if felt fevers, chills, worsening pain, ability to eat or drink due to nausea vomiting or he develop any new or worsening symptoms that are concerning to you.  We are prescribing these medications that she can pick up at the pharmacy for symptom relief.

## 2023-07-25 NOTE — ED Provider Notes (Signed)
[place] EMERGENCY DEPARTMENT AT [facility] Provider Note   CSN: [id number] Arrival date & time: 07/25/23  1314     History  Chief Complaint  Patient presents with   Abdominal Pain    Lorraine [name] is a 37 y.o. female.  37 year old female presenting emergency department with upper abdominal pain radiating to left side.  Symptoms are last night been constant.  Nausea no vomiting.  Normal bowel movements yesterday.  Still passing gas.  Prior cholecystectomy no other surgeries.   Abdominal Pain      Home Medications Prior to Admission medications   Medication Sig Start Date End Date Taking? Authorizing Provider  pantoprazole (PROTONIX) 20 MG tablet Take 1 tablet (20 mg total) by mouth daily for 14 days. 07/25/23 08/08/23 Yes Charlisa Cham, Harmon Dun, DO  sucralfate (CARAFATE) 1 g tablet Take 1 tablet (1 g total) by mouth with breakfast, with lunch, and with evening meal for 14 days. 07/25/23 08/08/23 Yes Shawna Kiener, Harmon Dun, DO  albuterol (VENTOLIN HFA) 108 (90 Base) MCG/ACT inhaler Inhale 2 puffs into the lungs every 4 (four) hours as needed for wheezing or shortness of breath. 08/25/22   Bethann Berkshire, MD  dicyclomine (BENTYL) 20 MG tablet Take 1 tablet (20 mg total) by mouth 2 (two) times daily. Patient not taking: Reported on 05/26/2023 03/30/23   Henderly, Britni A, PA-C  nitrofurantoin, macrocrystal-monohydrate, (MACROBID) 100 MG capsule Take 1 capsule (100 mg total) by mouth 2 (two) times daily. 06/03/23   Royanne Foots, DO  omeprazole (PRILOSEC) 20 MG capsule Take 1 capsule (20 mg total) by mouth daily. Patient not taking: Reported on 05/26/2023 11/09/22   Army Melia A, PA-C  ondansetron (ZOFRAN) 4 MG tablet Take 1 tablet (4 mg total) by mouth every 6 (six) hours. 06/03/23   Royanne Foots, DO  ondansetron (ZOFRAN-ODT) 4 MG disintegrating tablet Take 1 tablet (4 mg total) by mouth every 8 (eight) hours as needed for nausea or vomiting. Patient not taking: Reported on  05/26/2023 03/30/23   Henderly, Britni A, PA-C  ondansetron (ZOFRAN-ODT) 4 MG disintegrating tablet Take 1 tablet (4 mg total) by mouth every 8 (eight) hours as needed for nausea or vomiting. 05/26/23   Mannie Stabile, PA-C  cetirizine (ZYRTEC ALLERGY) 10 MG tablet Take 1 tablet (10 mg total) by mouth 2 (two) times daily. Patient not taking: Reported on 12/06/2020 11/12/20 01/24/21  Muthersbaugh, Dahlia Client, PA-C      Allergies    Iodinated contrast media, Lupine bean extract, Ceftriaxone, and Morphine    Review of Systems   Review of Systems  Gastrointestinal:  Positive for abdominal pain.    Physical Exam Updated Vital Signs BP 127/80 (BP Location: Left Arm)   Pulse 76   Temp 97.8 F (36.6 C) (Oral)   Resp 16   LMP 07/22/2023 (Within Days)   SpO2 99%  Physical Exam Vitals and nursing note reviewed.  Constitutional:      General: She is not in acute distress.    Appearance: She is not toxic-appearing.  Cardiovascular:     Rate and Rhythm: Normal rate and regular rhythm.  Abdominal:     General: Abdomen is flat.     Palpations: Abdomen is soft.     Tenderness: There is no abdominal tenderness. There is no guarding or rebound.  Neurological:     Mental Status: She is alert and oriented to person, place, and time.  Psychiatric:        Mood and Affect: Mood  normal.        Behavior: Behavior normal.     ED Results / Procedures / Treatments   Labs (all labs ordered are listed, but only abnormal results are displayed) Labs Reviewed  COMPREHENSIVE METABOLIC PANEL - Abnormal; Notable for the following components:      Result Value   Potassium 3.2 (*)    Glucose, Bld 103 (*)    All other components within normal limits  LIPASE, BLOOD  CBC  URINALYSIS, ROUTINE W REFLEX MICROSCOPIC  POC URINE PREG, ED    EKG None  Radiology No results found.  Procedures Procedures    Medications Ordered in ED Medications  alum & mag hydroxide-simeth (MAALOX/MYLANTA) 200-200-20  MG/5ML suspension 30 mL (30 mLs Oral Given 07/25/23 1614)  sucralfate (CARAFATE) tablet 1 g (1 g Oral Given 07/25/23 1615)  ondansetron (ZOFRAN) tablet 4 mg (4 mg Oral Given 07/25/23 1615)  famotidine (PEPCID) tablet 20 mg (20 mg Oral Given 07/25/23 1614)    ED Course/ Medical Decision Making/ A&P Clinical Course as of 07/25/23 1802  Thu Jul 25, 2023  1543 Per chart review appears to have had 6 negative CT abdomen and pelvis scans over this past year. [TY]  1544 Comprehensive metabolic panel(!) No significant metabolic derangements.  Mild hypokalemia.  No transaminitis to suggest hepatobiliary disease.  Normal kidney function [TY]  1545 Urinalysis, Routine w reflex microscopic -Urine, Clean Catch Not consistent with UTI.  No hematuria to suggest stone [TY]  1545 CBC No leukocytosis to suggest systemic infection. [TY]  1545 Lipase: 29 Pancreatitis unlikely.  [TY]  1756 Patient reevaluated.  Feeling improved after medications.  Workup today reassuring.  Low suspicion for acute intra-abdominal pathology.  Similar presentation to prior.  Has had numerous negative CT scans.  She has follow-up with gastro enterology in the next several weeks.  Stable for discharge at this time. [TY]    Clinical Course User Index [TY] Coral Spikes, DO                                 Medical Decision Making 37 year old female present emergency department abdominal pain.  Per chart review she has numerous visits to the emergency department with similar type complaints.  She is afebrile nontachycardic hemodynamically stable.  Soft nontender abdomen.  Basic labs and trial supportive medications.  Suspect gastritis type picture.  See ED course for reevaluation/final MDM/disposition.  Amount and/or Complexity of Data Reviewed External Data Reviewed:     Details: See ED course Labs: ordered. Decision-making details documented in ED Course.  Risk OTC drugs. Prescription drug management. Decision regarding  hospitalization. Diagnosis or treatment significantly limited by social determinants of health. Risk Details: Patient is primarily Hispanic speaking and history was obtained with aid of translator.  Given negative workup, vital signs physical exam and numerous negative CT scans in the past with seemingly similar presentation do not feel that patient needs advanced imaging or would warrant inpatient workup at this time.  Stable for discharge.          Final Clinical Impression(s) / ED Diagnoses Final diagnoses:  Abdominal pain, unspecified abdominal location    Rx / DC Orders ED Discharge Orders          Ordered    sucralfate (CARAFATE) 1 g tablet  3 times daily with meals        07/25/23 1759    pantoprazole (PROTONIX) 20 MG tablet  Daily        07/25/23 1759              Coral Spikes, DO 07/25/23 1802

## 2023-08-02 ENCOUNTER — Other Ambulatory Visit: Payer: Self-pay

## 2023-08-02 ENCOUNTER — Emergency Department (HOSPITAL_COMMUNITY)
Admission: EM | Admit: 2023-08-02 | Discharge: 2023-08-02 | Disposition: A | Discharge status: Home / Self Care | Payer: Self-pay | Attending: Emergency Medicine | Admitting: Emergency Medicine

## 2023-08-02 ENCOUNTER — Encounter (HOSPITAL_COMMUNITY): Payer: Self-pay | Admitting: Emergency Medicine

## 2023-08-02 DIAGNOSIS — R112 Nausea with vomiting, unspecified: Secondary | ICD-10-CM | POA: Insufficient documentation

## 2023-08-02 DIAGNOSIS — R1011 Right upper quadrant pain: Secondary | ICD-10-CM | POA: Insufficient documentation

## 2023-08-02 LAB — COMPREHENSIVE METABOLIC PANEL
ALT: 19 U/L (ref 0–44)
AST: 18 U/L (ref 15–41)
Albumin: 3.5 g/dL (ref 3.5–5.0)
Alkaline Phosphatase: 98 U/L (ref 38–126)
Anion gap: 9 (ref 5–15)
BUN: 15 mg/dL (ref 6–20)
CO2: 22 mmol/L (ref 22–32)
Calcium: 8.8 mg/dL — ABNORMAL LOW (ref 8.9–10.3)
Chloride: 106 mmol/L (ref 98–111)
Creatinine, Ser: 0.67 mg/dL (ref 0.44–1.00)
GFR, Estimated: >60 mL/min (ref >60)
Glucose, Bld: 130 mg/dL — ABNORMAL HIGH (ref 70–99)
Potassium: 3.6 mmol/L (ref 3.5–5.1)
Sodium: 137 mmol/L (ref 135–145)
Total Bilirubin: 0.6 mg/dL (ref <1.2)
Total Protein: 6.7 g/dL (ref 6.5–8.1)

## 2023-08-02 LAB — URINALYSIS, ROUTINE W REFLEX MICROSCOPIC
Bilirubin Urine: NEGATIVE
Glucose, UA: NEGATIVE mg/dL
Hgb urine dipstick: NEGATIVE
Ketones, ur: NEGATIVE mg/dL
Leukocytes,Ua: NEGATIVE
Nitrite: NEGATIVE
Protein, ur: NEGATIVE mg/dL
Specific Gravity, Urine: 1.02 (ref 1.005–1.030)
pH: 6 (ref 5.0–8.0)

## 2023-08-02 LAB — CBC
HCT: 41 % (ref 36.0–46.0)
Hemoglobin: 13.4 g/dL (ref 12.0–15.0)
MCH: 30.6 pg (ref 26.0–34.0)
MCHC: 32.7 g/dL (ref 30.0–36.0)
MCV: 93.6 fL (ref 80.0–100.0)
Platelets: 282 10*3/uL (ref 150–400)
RBC: 4.38 MIL/uL (ref 3.87–5.11)
RDW: 12.4 % (ref 11.5–15.5)
WBC: 10.4 10*3/uL (ref 4.0–10.5)
nRBC: 0 % (ref 0.0–0.2)

## 2023-08-02 LAB — LIPASE, BLOOD: Lipase: 37 U/L (ref 11–51)

## 2023-08-02 MED ORDER — METOCLOPRAMIDE HCL 10 MG PO TABS: 10.0000 mg | ORAL_TABLET | Freq: Three times a day (TID) | ORAL | 0 refills | Status: DC | PRN

## 2023-08-02 MED ORDER — KETOROLAC TROMETHAMINE 15 MG/ML IJ SOLN
15.0000 mg | Freq: Once | INTRAMUSCULAR | Status: AC
  Administered 2023-08-02: 15 mg via INTRAVENOUS
  Filled 2023-08-02: qty 1

## 2023-08-02 MED ORDER — SUCRALFATE 1 GM/10ML PO SUSP: 1.0000 g | Freq: Three times a day (TID) | ORAL | 0 refills | Status: DC

## 2023-08-02 MED ORDER — SUCRALFATE 1 GM/10ML PO SUSP
1.0000 g | Freq: Once | ORAL | Status: AC
  Administered 2023-08-02: 1 g via ORAL
  Filled 2023-08-02: qty 10

## 2023-08-02 NOTE — ED Triage Notes (Signed)
Patient complaining of right sided abdominal pain that started yesterday afternoon but became more severe tonight. Endorses nausea.

## 2023-08-02 NOTE — ED Provider Notes (Signed)
[place] EMERGENCY DEPARTMENT AT [facility] Provider Note  CSN: [id number] Arrival date & time: 08/02/23 0137  Chief Complaint(s) Abdominal Pain  HPI Lorraine [name] is a 37 y.o. female     Abdominal Pain Pain location:  RUQ Pain quality: aching and cramping   Pain radiates to:  R flank Pain severity:  Moderate Onset quality:  Gradual Duration:  1 week Timing:  Constant Progression:  Waxing and waning Chronicity:  New Relieved by:  Nothing Worsened by:  Eating Associated symptoms: nausea and vomiting   Associated symptoms: no constipation, no cough, no diarrhea, no dysuria, no fever, no hematemesis and no melena   Risk factors: no alcohol abuse, no aspirin use, no NSAID use and not pregnant     Past Medical History Past Medical History:  Diagnosis Date   Angio-edema    Asthma    Inhaler used 01/02/16   Diverticulitis    Nausea & vomiting 04/02/2017   Pancreatitis    Pre-diabetes    Pyelonephritis    Urticaria    Patient Active Problem List   Diagnosis Date Noted   Intractable vomiting    Acute diverticulitis 09/20/2018   Cervical radiculopathy    Left arm weakness    Left-sided weakness 02/19/2018   Weakness 02/19/2018   Rash 02/09/2018   Chest pain 02/08/2018   Acute pancreatitis 04/02/2017   Abdominal pain, acute, left upper quadrant 04/02/2017   Intractable nausea and vomiting 04/02/2017   Asthma in adult 04/02/2017   Obesity (BMI 30.0-34.9) 04/02/2017   Fatty infiltration of liver 04/02/2017   Gastroesophageal reflux disease without esophagitis    Pre-diabetes 07/15/2015   Diarrhea    Hypokalemia    Bacterial vaginosis    Pyelonephritis 07/07/2015   Abdominal pain, right upper quadrant    Asthma 10/31/2014   History of preterm delivery 10/22/2014   Status post repeat low transverse cesarean section 09/06/2014   Diverticulitis 09/10/2012   Home Medication(s) Prior to Admission medications   Medication Sig Start Date End Date  Taking? Authorizing Provider  albuterol (VENTOLIN HFA) 108 (90 Base) MCG/ACT inhaler Inhale 2 puffs into the lungs every 4 (four) hours as needed for wheezing or shortness of breath. 08/25/22  Yes Bethann Berkshire, MD  metoCLOPramide (REGLAN) 10 MG tablet Take 1 tablet (10 mg total) by mouth every 8 (eight) hours as needed for nausea. 08/02/23  Yes Aniken Monestime, Amadeo Garnet, MD  pantoprazole (PROTONIX) 20 MG tablet Take 1 tablet (20 mg total) by mouth daily for 14 days. 07/25/23 08/08/23 Yes Young, Harmon Dun, DO  sucralfate (CARAFATE) 1 GM/10ML suspension Take 10 mLs (1 g total) by mouth 4 (four) times daily -  with meals and at bedtime. 08/02/23  Yes Soriya Worster, Amadeo Garnet, MD  dicyclomine (BENTYL) 20 MG tablet Take 1 tablet (20 mg total) by mouth 2 (two) times daily. Patient not taking: Reported on 05/26/2023 03/30/23   Henderly, Britni A, PA-C  nitrofurantoin, macrocrystal-monohydrate, (MACROBID) 100 MG capsule Take 1 capsule (100 mg total) by mouth 2 (two) times daily. Patient not taking: Reported on 08/02/2023 06/03/23   Royanne Foots, DO  omeprazole (PRILOSEC) 20 MG capsule Take 1 capsule (20 mg total) by mouth daily. Patient not taking: Reported on 05/26/2023 11/09/22   Army Melia A, PA-C  ondansetron (ZOFRAN) 4 MG tablet Take 1 tablet (4 mg total) by mouth every 6 (six) hours. Patient not taking: Reported on 08/02/2023 06/03/23   Estelle June A, DO  ondansetron (ZOFRAN-ODT) 4 MG disintegrating tablet Take  1 tablet (4 mg total) by mouth every 8 (eight) hours as needed for nausea or vomiting. Patient not taking: Reported on 05/26/2023 03/30/23   Henderly, Britni A, PA-C  ondansetron (ZOFRAN-ODT) 4 MG disintegrating tablet Take 1 tablet (4 mg total) by mouth every 8 (eight) hours as needed for nausea or vomiting. Patient not taking: Reported on 08/02/2023 05/26/23   Mannie Stabile, PA-C  cetirizine (ZYRTEC ALLERGY) 10 MG tablet Take 1 tablet (10 mg total) by mouth 2 (two) times daily. Patient not  taking: Reported on 12/06/2020 11/12/20 01/24/21  Muthersbaugh, Dahlia Client, PA-C                                                                                                                                    Allergies Iodinated contrast media, Lupine bean extract, Ceftriaxone, and Morphine  Review of Systems Review of Systems  Constitutional:  Negative for fever.  Respiratory:  Negative for cough.   Gastrointestinal:  Positive for abdominal pain, nausea and vomiting. Negative for constipation, diarrhea, hematemesis and melena.  Genitourinary:  Negative for dysuria.   As noted in HPI  Physical Exam Vital Signs  I have reviewed the triage vital signs BP 111/74 (BP Location: Left Arm)   Pulse 86   Temp 98 F (36.7 C) (Oral)   Resp 18   Ht 5' (1.524 m)   Wt 78 kg   LMP 07/22/2023 (Within Days)   SpO2 96%   BMI 33.59 kg/m   Physical Exam Vitals reviewed.  Constitutional:      General: She is not in acute distress.    Appearance: She is well-developed. She is not diaphoretic.  HENT:     Head: Normocephalic and atraumatic.     Right Ear: External ear normal.     Left Ear: External ear normal.     Nose: Nose normal.  Eyes:     General: No scleral icterus.    Conjunctiva/sclera: Conjunctivae normal.  Neck:     Trachea: Phonation normal.  Cardiovascular:     Rate and Rhythm: Normal rate and regular rhythm.  Pulmonary:     Effort: Pulmonary effort is normal. No respiratory distress.     Breath sounds: No stridor.  Abdominal:     General: There is no distension.     Tenderness: There is abdominal tenderness in the right upper quadrant. There is no guarding. Negative signs include Murphy's sign.  Musculoskeletal:        General: Normal range of motion.     Cervical back: Normal range of motion.  Neurological:     Mental Status: She is alert and oriented to person, place, and time.  Psychiatric:        Behavior: Behavior normal.     ED Results and Treatments Labs (all  labs ordered are listed, but only abnormal results are displayed) Labs Reviewed  COMPREHENSIVE METABOLIC PANEL - Abnormal; Notable for the following components:  Result Value   Glucose, Bld 130 (*)    Calcium 8.8 (*)    All other components within normal limits  CBC  URINALYSIS, ROUTINE W REFLEX MICROSCOPIC  LIPASE, BLOOD  HCG, SERUM, QUALITATIVE                                                                                                                         EKG  EKG Interpretation Date/Time:    Ventricular Rate:    PR Interval:    QRS Duration:    QT Interval:    QTC Calculation:   R Axis:      Text Interpretation:         Radiology No results found.  Medications Ordered in ED Medications  ketorolac (TORADOL) 15 MG/ML injection 15 mg (15 mg Intravenous Given 08/02/23 0543)  sucralfate (CARAFATE) 1 GM/10ML suspension 1 g (1 g Oral Given 08/02/23 0542)   Procedures Procedures  (including critical care time) Medical Decision Making / ED Course   Medical Decision Making Amount and/or Complexity of Data Reviewed Labs: ordered.  Risk Prescription drug management.    RUQ pain DDx and w/u below:  She is s/p cholecystectomy.  Possible in situ stone but feel this is unlikely.  Patient has a history of pancreatitis and will rule this out as well.  Also considering peptic ulcer disease.  Less suspicious for serious intra-abdominal inflammatory/infectious process.  Will reconsider if labs are concerning.  CBC without leukocytosis or anemia.  CMP without significant electrolyte derangements or renal sufficiency.  No evidence of bili obstruction or pancreatitis.  UA without evidence of infection ruling out pyelonephritis.  No need for imaging at this time.  Patient provided with IV Toradol and liquid Carafate which provided some relief.  Will change Carafate to liquid and also prescribe patient Reglan.  She is already scheduled for GI follow-up in 4 days.     Final Clinical Impression(s) / ED Diagnoses Final diagnoses:  RUQ pain   The patient appears reasonably screened and/or stabilized for discharge and I doubt any other medical condition or other [facility] requiring further screening, evaluation, or treatment in the ED at this time. I have discussed the findings, Dx and Tx plan with the patient/family who expressed understanding and agree(s) with the plan. Discharge instructions discussed at length. The patient/family was given strict return precautions who verbalized understanding of the instructions. No further questions at time of discharge.  Disposition: Discharge  Condition: Good  ED Discharge Orders          Ordered    sucralfate (CARAFATE) 1 GM/10ML suspension  3 times daily with meals & bedtime        08/02/23 0601    metoCLOPramide (REGLAN) 10 MG tablet  Every 8 hours PRN        08/02/23 0601             Follow Up: Gastroenterology, Deboraha Sprang [address] [place] [address] [phone]  Go  to  as scheduled    This chart was dictated using voice recognition software.  Despite best efforts to proofread,  errors can occur which can change the documentation meaning.    Nira Conn, MD 08/02/23 [phone]

## 2023-08-04 ENCOUNTER — Emergency Department (HOSPITAL_BASED_OUTPATIENT_CLINIC_OR_DEPARTMENT_OTHER)
Admission: EM | Admit: 2023-08-04 | Discharge: 2023-08-04 | Disposition: A | Discharge status: Home / Self Care | Payer: Self-pay | Attending: Emergency Medicine | Admitting: Emergency Medicine

## 2023-08-04 ENCOUNTER — Other Ambulatory Visit: Payer: Self-pay

## 2023-08-04 DIAGNOSIS — R112 Nausea with vomiting, unspecified: Secondary | ICD-10-CM | POA: Insufficient documentation

## 2023-08-04 DIAGNOSIS — R1031 Right lower quadrant pain: Secondary | ICD-10-CM | POA: Insufficient documentation

## 2023-08-04 DIAGNOSIS — G8929 Other chronic pain: Secondary | ICD-10-CM

## 2023-08-04 DIAGNOSIS — R1011 Right upper quadrant pain: Secondary | ICD-10-CM | POA: Insufficient documentation

## 2023-08-04 LAB — COMPREHENSIVE METABOLIC PANEL
ALT: 14 U/L (ref 0–44)
AST: 16 U/L (ref 15–41)
Albumin: 4 g/dL (ref 3.5–5.0)
Alkaline Phosphatase: 112 U/L (ref 38–126)
Anion gap: 9 (ref 5–15)
BUN: 18 mg/dL (ref 6–20)
CO2: 21 mmol/L — ABNORMAL LOW (ref 22–32)
Calcium: 9.2 mg/dL (ref 8.9–10.3)
Chloride: 108 mmol/L (ref 98–111)
Creatinine, Ser: 0.79 mg/dL (ref 0.44–1.00)
GFR, Estimated: >60 mL/min (ref >60)
Glucose, Bld: 110 mg/dL — ABNORMAL HIGH (ref 70–99)
Potassium: 3.6 mmol/L (ref 3.5–5.1)
Sodium: 138 mmol/L (ref 135–145)
Total Bilirubin: 0.2 mg/dL (ref <1.2)
Total Protein: 7 g/dL (ref 6.5–8.1)

## 2023-08-04 LAB — CBC WITH DIFFERENTIAL/PLATELET
Abs Immature Granulocytes: 0.04 10*3/uL (ref 0.00–0.07)
Basophils Absolute: 0.1 10*3/uL (ref 0.0–0.1)
Basophils Relative: 1 %
Eosinophils Absolute: 0.2 10*3/uL (ref 0.0–0.5)
Eosinophils Relative: 2 %
HCT: 34.5 % — ABNORMAL LOW (ref 36.0–46.0)
Hemoglobin: 11.6 g/dL — ABNORMAL LOW (ref 12.0–15.0)
Immature Granulocytes: 0 %
Lymphocytes Relative: 37 %
Lymphs Abs: 3.4 10*3/uL (ref 0.7–4.0)
MCH: 30.3 pg (ref 26.0–34.0)
MCHC: 33.6 g/dL (ref 30.0–36.0)
MCV: 90.1 fL (ref 80.0–100.0)
Monocytes Absolute: 0.4 10*3/uL (ref 0.1–1.0)
Monocytes Relative: 5 %
Neutro Abs: 5.1 10*3/uL (ref 1.7–7.7)
Neutrophils Relative %: 55 %
Platelets: 233 10*3/uL (ref 150–400)
RBC: 3.83 MIL/uL — ABNORMAL LOW (ref 3.87–5.11)
RDW: 12.5 % (ref 11.5–15.5)
WBC: 9.2 10*3/uL (ref 4.0–10.5)
nRBC: 0 % (ref 0.0–0.2)

## 2023-08-04 LAB — LIPASE, BLOOD: Lipase: 23 U/L (ref 11–51)

## 2023-08-04 MED ORDER — ALUM & MAG HYDROXIDE-SIMETH 200-200-20 MG/5ML PO SUSP
30.0000 mL | Freq: Once | ORAL | Status: AC
  Administered 2023-08-04: 30 mL via ORAL
  Filled 2023-08-04: qty 30

## 2023-08-04 MED ORDER — SODIUM CHLORIDE 0.9 % IV BOLUS
1000.0000 mL | Freq: Once | INTRAVENOUS | Status: AC
Start: 2023-08-04 — End: 2023-08-04
  Administered 2023-08-04: 1000 mL via INTRAVENOUS

## 2023-08-04 MED ORDER — KETOROLAC TROMETHAMINE 30 MG/ML IJ SOLN
30.0000 mg | Freq: Once | INTRAMUSCULAR | Status: AC
Start: 2023-08-04 — End: 2023-08-04
  Administered 2023-08-04: 30 mg via INTRAVENOUS
  Filled 2023-08-04: qty 1

## 2023-08-04 MED ORDER — ONDANSETRON HCL 4 MG/2ML IJ SOLN
4.0000 mg | Freq: Once | INTRAMUSCULAR | Status: AC
Start: 2023-08-04 — End: 2023-08-04
  Administered 2023-08-04: 4 mg via INTRAVENOUS
  Filled 2023-08-04: qty 2

## 2023-08-04 MED ORDER — LIDOCAINE VISCOUS HCL 2 % MT SOLN
15.0000 mL | Freq: Once | OROMUCOSAL | Status: AC
  Administered 2023-08-04: 15 mL via ORAL
  Filled 2023-08-04: qty 15

## 2023-08-04 NOTE — ED Triage Notes (Signed)
Spanish speaking PT to exam 13 c/o right sided ABD pain 7/10 x 3 days with Nausea. Pt seen on 11/15 @ [facility] and Dx stomach Ulcers. Pt VSS NAD PT on room air.

## 2023-08-04 NOTE — Discharge Instructions (Signed)
Take ibuprofen 600 mg every 6 hours as needed for pain.  Follow-up with gastroenterology on Wednesday as scheduled.

## 2023-08-04 NOTE — ED Provider Notes (Signed)
[place] EMERGENCY DEPARTMENT AT [facility] Provider Note   CSN: [id number] Arrival date & time: 08/04/23  4098     History  Chief Complaint  Patient presents with   Abdominal Pain    Lorraine [name] is a 37 y.o. female.  Patient is a 37 year old female with past medical history of chronic abdominal pain for many years.  Patient frequents the emergency department with similar complaints.  She presents today with complaints of right sided abdominal pain, nausea, and vomiting that started several days ago.  She was seen here 2 days ago with similar complaints and given medications, however symptoms have returned.  No fevers or chills.  No bloody stool or vomit.  She tells me she has not seen a GI doctor, but does have a follow-up appointment this Wednesday.  The history is provided by the patient.       Home Medications Prior to Admission medications   Medication Sig Start Date End Date Taking? Authorizing Provider  albuterol (VENTOLIN HFA) 108 (90 Base) MCG/ACT inhaler Inhale 2 puffs into the lungs every 4 (four) hours as needed for wheezing or shortness of breath. 08/25/22   Bethann Berkshire, MD  dicyclomine (BENTYL) 20 MG tablet Take 1 tablet (20 mg total) by mouth 2 (two) times daily. Patient not taking: Reported on 05/26/2023 03/30/23   Henderly, Britni A, PA-C  metoCLOPramide (REGLAN) 10 MG tablet Take 1 tablet (10 mg total) by mouth every 8 (eight) hours as needed for nausea. 08/02/23   Cardama, Amadeo Garnet, MD  nitrofurantoin, macrocrystal-monohydrate, (MACROBID) 100 MG capsule Take 1 capsule (100 mg total) by mouth 2 (two) times daily. Patient not taking: Reported on 08/02/2023 06/03/23   Royanne Foots, DO  omeprazole (PRILOSEC) 20 MG capsule Take 1 capsule (20 mg total) by mouth daily. Patient not taking: Reported on 05/26/2023 11/09/22   Army Melia A, PA-C  ondansetron (ZOFRAN) 4 MG tablet Take 1 tablet (4 mg total) by mouth every 6 (six) hours. Patient not  taking: Reported on 08/02/2023 06/03/23   Estelle June A, DO  ondansetron (ZOFRAN-ODT) 4 MG disintegrating tablet Take 1 tablet (4 mg total) by mouth every 8 (eight) hours as needed for nausea or vomiting. Patient not taking: Reported on 05/26/2023 03/30/23   Henderly, Britni A, PA-C  ondansetron (ZOFRAN-ODT) 4 MG disintegrating tablet Take 1 tablet (4 mg total) by mouth every 8 (eight) hours as needed for nausea or vomiting. Patient not taking: Reported on 08/02/2023 05/26/23   Mannie Stabile, PA-C  pantoprazole (PROTONIX) 20 MG tablet Take 1 tablet (20 mg total) by mouth daily for 14 days. 07/25/23 08/08/23  Coral Spikes, DO  sucralfate (CARAFATE) 1 GM/10ML suspension Take 10 mLs (1 g total) by mouth 4 (four) times daily -  with meals and at bedtime. 08/02/23   Nira Conn, MD  cetirizine (ZYRTEC ALLERGY) 10 MG tablet Take 1 tablet (10 mg total) by mouth 2 (two) times daily. Patient not taking: Reported on 12/06/2020 11/12/20 01/24/21  Muthersbaugh, Dahlia Client, PA-C      Allergies    Iodinated contrast media, Lupine bean extract, Ceftriaxone, and Morphine    Review of Systems   Review of Systems  All other systems reviewed and are negative.   Physical Exam Updated Vital Signs BP 130/88 (BP Location: Left Arm)   Pulse 73   Temp 97.7 F (36.5 C) (Oral)   Resp 18   Wt 78 kg   LMP 07/22/2023 (Within Days)   SpO2 100%  BMI 33.58 kg/m  Physical Exam Vitals and nursing note reviewed.  Constitutional:      General: She is not in acute distress.    Appearance: She is well-developed. She is not diaphoretic.  HENT:     Head: Normocephalic and atraumatic.  Cardiovascular:     Rate and Rhythm: Normal rate and regular rhythm.     Heart sounds: No murmur heard.    No friction rub. No gallop.  Pulmonary:     Effort: Pulmonary effort is normal. No respiratory distress.     Breath sounds: Normal breath sounds. No wheezing.  Abdominal:     General: Bowel sounds are normal. There  is no distension.     Palpations: Abdomen is soft.     Tenderness: There is abdominal tenderness in the right upper quadrant and right lower quadrant. There is no right CVA tenderness, left CVA tenderness, guarding or rebound.  Musculoskeletal:        General: Normal range of motion.     Cervical back: Normal range of motion and neck supple.  Skin:    General: Skin is warm and dry.  Neurological:     General: No focal deficit present.     Mental Status: She is alert and oriented to person, place, and time.     ED Results / Procedures / Treatments   Labs (all labs ordered are listed, but only abnormal results are displayed) Labs Reviewed  COMPREHENSIVE METABOLIC PANEL  CBC WITH DIFFERENTIAL/PLATELET  LIPASE, BLOOD    EKG None  Radiology No results found.  Procedures Procedures    Medications Ordered in ED Medications  ondansetron (ZOFRAN) injection 4 mg (has no administration in time range)  ketorolac (TORADOL) 30 MG/ML injection 30 mg (has no administration in time range)  sodium chloride 0.9 % bolus 1,000 mL (has no administration in time range)    ED Course/ Medical Decision Making/ A&P  Patient is a 37 year old female with history of chronic abdominal pain ongoing for many years.  Patient well-known to the emergency department with frequent visits.  She presents today with right sided abdominal pain after being seen for the same 2 days ago.  Patient arrives with stable vital signs and is afebrile.  She does have mild tenderness to the right mid abdomen, but no peritoneal signs.  Laboratory studies obtained including CBC, CMP, and lipase, all of which are unremarkable.  There is no leukocytosis, no elevation of liver or pancreatic enzymes, and no electrolyte derangement.  Patient hydrated with normal saline and has been given Toradol for pain and Zofran for nausea.  She has also been given a GI cocktail.  At this point, given no change in her laboratory studies and  chronic nature of her complaints, I do not feel imaging studies are indicated.  She tells me she has an appointment with GI on Wednesday and I will advise her to keep this appointment.  Patient to be discharged with outpatient follow-up.  Final Clinical Impression(s) / ED Diagnoses Final diagnoses:  None    Rx / DC Orders ED Discharge Orders     None         Geoffery Lyons, MD 08/04/23 [phone]

## 2023-08-05 ENCOUNTER — Telehealth: Payer: Self-pay

## 2023-08-05 NOTE — Telephone Encounter (Signed)
Spoke with interpreter and pt. Pt understands she need to contact [facility] to be seen by them Pt is established with [facility].

## 2023-08-06 ENCOUNTER — Ambulatory Visit: Payer: Self-pay | Admitting: Gastroenterology

## 2023-08-06 ENCOUNTER — Encounter (HOSPITAL_COMMUNITY): Payer: Self-pay

## 2023-08-06 ENCOUNTER — Emergency Department (HOSPITAL_COMMUNITY)
Admission: EM | Admit: 2023-08-06 | Discharge: 2023-08-06 | Disposition: A | Discharge status: Home / Self Care | Payer: Self-pay | Attending: Emergency Medicine | Admitting: Emergency Medicine

## 2023-08-06 ENCOUNTER — Other Ambulatory Visit: Payer: Self-pay

## 2023-08-06 DIAGNOSIS — R112 Nausea with vomiting, unspecified: Secondary | ICD-10-CM | POA: Insufficient documentation

## 2023-08-06 DIAGNOSIS — J45909 Unspecified asthma, uncomplicated: Secondary | ICD-10-CM | POA: Insufficient documentation

## 2023-08-06 DIAGNOSIS — R1011 Right upper quadrant pain: Secondary | ICD-10-CM | POA: Insufficient documentation

## 2023-08-06 LAB — HCG, SERUM, QUALITATIVE: Preg, Serum: NEGATIVE

## 2023-08-06 LAB — CBC WITH DIFFERENTIAL/PLATELET
Abs Immature Granulocytes: 0.04 10*3/uL (ref 0.00–0.07)
Basophils Absolute: 0.1 10*3/uL (ref 0.0–0.1)
Basophils Relative: 1 %
Eosinophils Absolute: 0.2 10*3/uL (ref 0.0–0.5)
Eosinophils Relative: 3 %
HCT: 35.8 % — ABNORMAL LOW (ref 36.0–46.0)
Hemoglobin: 12.2 g/dL (ref 12.0–15.0)
Immature Granulocytes: 1 %
Lymphocytes Relative: 36 %
Lymphs Abs: 2.9 10*3/uL (ref 0.7–4.0)
MCH: 31.4 pg (ref 26.0–34.0)
MCHC: 34.1 g/dL (ref 30.0–36.0)
MCV: 92 fL (ref 80.0–100.0)
Monocytes Absolute: 0.5 10*3/uL (ref 0.1–1.0)
Monocytes Relative: 6 %
Neutro Abs: 4.5 10*3/uL (ref 1.7–7.7)
Neutrophils Relative %: 53 %
Platelets: 248 10*3/uL (ref 150–400)
RBC: 3.89 MIL/uL (ref 3.87–5.11)
RDW: 12.4 % (ref 11.5–15.5)
WBC: 8.1 10*3/uL (ref 4.0–10.5)
nRBC: 0 % (ref 0.0–0.2)

## 2023-08-06 LAB — URINALYSIS, ROUTINE W REFLEX MICROSCOPIC
Bilirubin Urine: NEGATIVE
Glucose, UA: NEGATIVE mg/dL
Hgb urine dipstick: NEGATIVE
Ketones, ur: NEGATIVE mg/dL
Leukocytes,Ua: NEGATIVE
Nitrite: NEGATIVE
Protein, ur: NEGATIVE mg/dL
Specific Gravity, Urine: 1.021 (ref 1.005–1.030)
pH: 5 (ref 5.0–8.0)

## 2023-08-06 LAB — COMPREHENSIVE METABOLIC PANEL
ALT: 17 U/L (ref 0–44)
AST: 27 U/L (ref 15–41)
Albumin: 3.8 g/dL (ref 3.5–5.0)
Alkaline Phosphatase: 94 U/L (ref 38–126)
Anion gap: 10 (ref 5–15)
BUN: 17 mg/dL (ref 6–20)
CO2: 21 mmol/L — ABNORMAL LOW (ref 22–32)
Calcium: 8.6 mg/dL — ABNORMAL LOW (ref 8.9–10.3)
Chloride: 106 mmol/L (ref 98–111)
Creatinine, Ser: 0.74 mg/dL (ref 0.44–1.00)
GFR, Estimated: >60 mL/min (ref >60)
Glucose, Bld: 112 mg/dL — ABNORMAL HIGH (ref 70–99)
Potassium: 3.5 mmol/L (ref 3.5–5.1)
Sodium: 137 mmol/L (ref 135–145)
Total Bilirubin: 0.8 mg/dL (ref <1.2)
Total Protein: 7.1 g/dL (ref 6.5–8.1)

## 2023-08-06 LAB — LIPASE, BLOOD: Lipase: 35 U/L (ref 11–51)

## 2023-08-06 MED ORDER — ALUM & MAG HYDROXIDE-SIMETH 200-200-20 MG/5ML PO SUSP
30.0000 mL | Freq: Once | ORAL | Status: AC
  Administered 2023-08-06: 30 mL via ORAL
  Filled 2023-08-06: qty 30

## 2023-08-06 MED ORDER — ONDANSETRON HCL 4 MG/2ML IJ SOLN
4.0000 mg | Freq: Once | INTRAMUSCULAR | Status: AC
  Administered 2023-08-06: 4 mg via INTRAVENOUS
  Filled 2023-08-06: qty 2

## 2023-08-06 MED ORDER — KETOROLAC TROMETHAMINE 15 MG/ML IJ SOLN
15.0000 mg | Freq: Once | INTRAMUSCULAR | Status: AC
  Administered 2023-08-06: 15 mg via INTRAVENOUS
  Filled 2023-08-06: qty 1

## 2023-08-06 MED ORDER — LIDOCAINE VISCOUS HCL 2 % MT SOLN
15.0000 mL | Freq: Once | OROMUCOSAL | Status: AC
  Administered 2023-08-06: 15 mL via ORAL
  Filled 2023-08-06: qty 15

## 2023-08-06 MED ORDER — ACETAMINOPHEN 325 MG PO TABS
650.0000 mg | ORAL_TABLET | Freq: Once | ORAL | Status: AC
  Administered 2023-08-06: 650 mg via ORAL
  Filled 2023-08-06: qty 2

## 2023-08-06 NOTE — Discharge Instructions (Addendum)
Gracias por dejarnos evaluarte hoy.  Su anlisis de laboratorio fue negativo para infeccin, anomala Scientist, research (life sciences), cualquier cosa que requiera intervencin en el servicio de urgencias.  Le dimos Maalox con analgsicos y antinauseas aqu en el servicio de urgencias.  Le he proporcionado 2 recursos para llamar y programar una cita en gastroenterologa para un tratamiento posterior. tome sus medicamentos recetados segn lo recetado (incluidos Carafate, Reglan, Protonix)  Puede tomar tylenol e ibuprofeno para el dolor abdominal o usar parches de lidocana que se encuentran sin receta en las farmacias.  Regrese al departamento de emergencias si experimenta vmitos excesivos que amenacen su estado de hidratacin, empeoramiento significativo del dolor abdominal, sangre en las heces o vmito con sangre.

## 2023-08-06 NOTE — ED Triage Notes (Signed)
Interpreter used. Pt reports stomachache for several days. Had her appointment canceled by specialist and needed to be seen to know what is going on. Wants to have H Pylori tested.

## 2023-08-06 NOTE — ED Provider Notes (Signed)
[place] EMERGENCY DEPARTMENT AT [facility] Provider Note   CSN: [id number] Arrival date & time: 08/06/23  0750     History  Chief Complaint  Patient presents with   Nausea   Emesis   Abdominal Pain   Leg Pain    Lorraine [name] is a 37 y.o. female with past medical history of asthma, diverticulitis, pancreatitis, cholecystectomy presents emergency department for evaluation of RUQ abdominal pain that radiates down her right leg that started four days ago.  She describes her pain as "how it feels after drinking a lot of water then walking". She reports that GI canceled her appointment today and she needs to know what is going on. She is interested in H. Pylori testing.  Last BM was yesterday and normal. She denies constipation, diarrhea hematochezia, fevers.  The history is provided by the patient. The history is limited by a language barrier. A language interpreter was used.  Emesis Associated symptoms: abdominal pain   Associated symptoms: no chills, no cough, no diarrhea, no fever and no headaches   Abdominal Pain Associated symptoms: vomiting   Associated symptoms: no chest pain, no chills, no constipation, no cough, no diarrhea, no fatigue, no fever, no nausea and no shortness of breath   Leg Pain Associated symptoms: no fatigue and no fever       Home Medications Prior to Admission medications   Medication Sig Start Date End Date Taking? Authorizing Provider  albuterol (VENTOLIN HFA) 108 (90 Base) MCG/ACT inhaler Inhale 2 puffs into the lungs every 4 (four) hours as needed for wheezing or shortness of breath. 08/25/22   Bethann Berkshire, MD  dicyclomine (BENTYL) 20 MG tablet Take 1 tablet (20 mg total) by mouth 2 (two) times daily. Patient not taking: Reported on 05/26/2023 03/30/23   Henderly, Britni A, PA-C  metoCLOPramide (REGLAN) 10 MG tablet Take 1 tablet (10 mg total) by mouth every 8 (eight) hours as needed for nausea. 08/02/23   Cardama, Amadeo Garnet, MD   nitrofurantoin, macrocrystal-monohydrate, (MACROBID) 100 MG capsule Take 1 capsule (100 mg total) by mouth 2 (two) times daily. Patient not taking: Reported on 08/02/2023 06/03/23   Royanne Foots, DO  omeprazole (PRILOSEC) 20 MG capsule Take 1 capsule (20 mg total) by mouth daily. Patient not taking: Reported on 05/26/2023 11/09/22   Army Melia A, PA-C  ondansetron (ZOFRAN) 4 MG tablet Take 1 tablet (4 mg total) by mouth every 6 (six) hours. Patient not taking: Reported on 08/02/2023 06/03/23   Estelle June A, DO  ondansetron (ZOFRAN-ODT) 4 MG disintegrating tablet Take 1 tablet (4 mg total) by mouth every 8 (eight) hours as needed for nausea or vomiting. Patient not taking: Reported on 05/26/2023 03/30/23   Henderly, Britni A, PA-C  ondansetron (ZOFRAN-ODT) 4 MG disintegrating tablet Take 1 tablet (4 mg total) by mouth every 8 (eight) hours as needed for nausea or vomiting. Patient not taking: Reported on 08/02/2023 05/26/23   Mannie Stabile, PA-C  pantoprazole (PROTONIX) 20 MG tablet Take 1 tablet (20 mg total) by mouth daily for 14 days. 07/25/23 08/08/23  Coral Spikes, DO  sucralfate (CARAFATE) 1 GM/10ML suspension Take 10 mLs (1 g total) by mouth 4 (four) times daily -  with meals and at bedtime. 08/02/23   Nira Conn, MD  cetirizine (ZYRTEC ALLERGY) 10 MG tablet Take 1 tablet (10 mg total) by mouth 2 (two) times daily. Patient not taking: Reported on 12/06/2020 11/12/20 01/24/21  Muthersbaugh, Dahlia Client, PA-C  Allergies    Iodinated contrast media, Lupine bean extract, Ceftriaxone, and Morphine    Review of Systems   Review of Systems  Constitutional:  Negative for chills, fatigue and fever.  Respiratory:  Negative for cough, chest tightness, shortness of breath and wheezing.   Cardiovascular:  Negative for chest pain and palpitations.  Gastrointestinal:  Positive for abdominal pain and vomiting. Negative for constipation, diarrhea and nausea.  Neurological:  Negative  for dizziness, seizures, weakness, light-headedness, numbness and headaches.    Physical Exam Updated Vital Signs BP 115/85   Pulse 78   Temp 98.7 F (37.1 C) (Oral)   Resp 16   Ht 5' (1.524 m)   Wt 77.1 kg   LMP 07/22/2023 (Within Days)   SpO2 98%   BMI 33.20 kg/m  Physical Exam Vitals and nursing note reviewed.  Constitutional:      General: She is not in acute distress.    Appearance: Normal appearance.  HENT:     Head: Normocephalic and atraumatic.  Eyes:     Conjunctiva/sclera: Conjunctivae normal.  Cardiovascular:     Rate and Rhythm: Normal rate.     Pulses: Normal pulses.  Pulmonary:     Effort: Pulmonary effort is normal. No respiratory distress.     Breath sounds: Normal breath sounds.  Chest:     Chest wall: No tenderness.  Abdominal:     General: Bowel sounds are normal. There is no distension.     Palpations: Abdomen is soft. There is no mass.     Tenderness: There is abdominal tenderness (Mild TTP of RUQ). There is no right CVA tenderness, left CVA tenderness, guarding or rebound.     Comments: No peritoneal signs  Musculoskeletal:        General: No swelling or signs of injury.     Right lower leg: No edema.     Left lower leg: No edema.  Skin:    Capillary Refill: Capillary refill takes less than 2 seconds.     Coloration: Skin is not jaundiced or pale.  Neurological:     Mental Status: She is alert and oriented to person, place, and time. Mental status is at baseline.     Sensory: No sensory deficit.     Motor: No weakness.     Gait: Gait normal.     ED Results / Procedures / Treatments   Labs (all labs ordered are listed, but only abnormal results are displayed) Labs Reviewed  CBC WITH DIFFERENTIAL/PLATELET - Abnormal; Notable for the following components:      Result Value   HCT 35.8 (*)    All other components within normal limits  COMPREHENSIVE METABOLIC PANEL - Abnormal; Notable for the following components:   CO2 21 (*)    Glucose,  Bld 112 (*)    Calcium 8.6 (*)    All other components within normal limits  LIPASE, BLOOD  URINALYSIS, ROUTINE W REFLEX MICROSCOPIC  HCG, SERUM, QUALITATIVE    EKG None  Radiology No results found.  Procedures Procedures    Medications Ordered in ED Medications  ondansetron (ZOFRAN) injection 4 mg (4 mg Intravenous Given 08/06/23 0914)  acetaminophen (TYLENOL) tablet 650 mg (650 mg Oral Given 08/06/23 0912)  alum & mag hydroxide-simeth (MAALOX/MYLANTA) 200-200-20 MG/5ML suspension 30 mL (30 mLs Oral Given 08/06/23 0913)    And  lidocaine (XYLOCAINE) 2 % viscous mouth solution 15 mL (15 mLs Oral Given 08/06/23 0913)  ketorolac (TORADOL) 15 MG/ML injection 15 mg (15 mg  Intravenous Given 08/06/23 1048)    ED Course/ Medical Decision Making/ A&P                                 Medical Decision Making   Patient presents to the ED for concern of RUQ abdominal pain, this involves an extensive number of treatment options, and is a complaint that carries with it a high risk of complications and morbidity.  The differential diagnosis includes GERD, gastritis, gastroenteritis, ulcer, pylo, other intraabdominal pathology.   Co morbidities that complicate the patient evaluation  None   Additional history obtained:  Additional history obtained from Nursing and Outside Medical Records   External records from outside source obtained and reviewed including multiple ED visits since July regarding similar symptoms Last visit from 08/04/2023 is significant for similar pain of RUQ abdominal pain.  Lab workup was negative for abnormalities and reported improvement following NS bolus, Toradol, Zofran, GI cocktail She was prescribed Carafate, Reglan for symptoms from ED visit on 08/02/2023   Lab Tests:  I Ordered, and personally interpreted labs.  The pertinent results include:  no leukocytosis, anemia, electrolyte abnormality. Neg lipase. Neg hCG. UA neg for infection   Medicines  ordered and prescription drug management:  I ordered medication including tylenol, zofran, gi cocktail, and toradol  for pain management, nausea  Reevaluation of the patient after these medicines showed that the patient improved I have reviewed the patients home medicines and have made adjustments as needed   Test Considered:  I considered CT of abdomen and right upper quadrant ultrasound.  However, patient is status post cholecystectomy and pathology would likely not be visualized on ultrasound. I do not feel that imaging is required based on chronic nature of symptoms.  Her exam is remarkable for mild tenderness to RUQ without peritoneal signs.  Lab work is not significant for lipid abnormality, elevated LFTs, leukocytosis.    Problem List / ED Course:  RUQ abdominal pain   Reevaluation:  After the interventions noted above, I reevaluated the patient and found that they have :improved   Social Determinants of Health:  No current PCP nor GI provider - will provide an additional GI reference   Dispostion:  Patient is well appearing.  She does not appear to be in physical distress or significant amount of pain.  Vital signs within normal limits and afebrile.  See HPI.  Upon questioning, patient is constantly rubbing her face causing difficulty with translator understanding and hearing her.  She reports this is due to significant pain.  She complains of right upper quadrant abdominal pain that radiates into her right lower leg for the past 4 days.  Physical exam is significant for mild tenderness to palpation with deep palpation.  There is no guarding there is no peritoneal signs.  There is no skin changes to abdomen.  Will obtain labs and give her GI cocktail to see if this improves her symptoms. As she has had 4 normal ED workups over the past month with reassuring PE, will attempt to manage symptoms. However, will consider imaging if labs grossly abnormal or indicate infection. No  TTP of R leg. Dorsalis pedis 2+ equal bilaterally. No skin changes to RLE. She is neurologically intact.  After consideration of the diagnostic results and the patients response to treatment, I feel that the patent would benefit from outpatient management and GI follow up.  Following pain management and GI cocktail, patient  reports significant improvement in pain.  She appears to be in less pain and continues to be well-appearing.  She remains afebrile with vital signs within normal limits throughout ED visit.  I have provided patient with two references as she reports that he previous reference cancelled her appointment today and are not taking new patients. I discussed importance of taking her medications as prescribed to decrease GERD pain and following up with GI.  I discussed plan, disposition, return to emerged part precautions with patient expresses understanding agrees with plan.  Return to emerged part with precautions include intractable vomiting affecting hydration status, hematochezia, melena, worsening abdominal pain especially in setting of fever. All questions answered to her satisfaction.        Final Clinical Impression(s) / ED Diagnoses Final diagnoses:  Right upper quadrant abdominal pain    Rx / DC Orders ED Discharge Orders     None         Judithann Sheen, PA 08/06/23 1132    Melene Plan, DO 08/06/23 1244

## 2023-08-19 ENCOUNTER — Encounter (HOSPITAL_COMMUNITY): Payer: Self-pay

## 2023-08-19 ENCOUNTER — Emergency Department (HOSPITAL_COMMUNITY)
Admission: EM | Admit: 2023-08-19 | Discharge: 2023-08-19 | Disposition: A | Discharge status: Home / Self Care | Payer: Self-pay | Attending: Emergency Medicine | Admitting: Emergency Medicine

## 2023-08-19 ENCOUNTER — Other Ambulatory Visit: Payer: Self-pay

## 2023-08-19 DIAGNOSIS — R1084 Generalized abdominal pain: Secondary | ICD-10-CM

## 2023-08-19 DIAGNOSIS — Z1152 Encounter for screening for COVID-19: Secondary | ICD-10-CM | POA: Insufficient documentation

## 2023-08-19 LAB — COMPREHENSIVE METABOLIC PANEL
ALT: 18 U/L (ref 0–44)
AST: 22 U/L (ref 15–41)
Albumin: 3.6 g/dL (ref 3.5–5.0)
Alkaline Phosphatase: 101 U/L (ref 38–126)
Anion gap: 9 (ref 5–15)
BUN: 21 mg/dL — ABNORMAL HIGH (ref 6–20)
CO2: 22 mmol/L (ref 22–32)
Calcium: 9.2 mg/dL (ref 8.9–10.3)
Chloride: 109 mmol/L (ref 98–111)
Creatinine, Ser: 0.86 mg/dL (ref 0.44–1.00)
GFR, Estimated: >60 mL/min (ref >60)
Glucose, Bld: 118 mg/dL — ABNORMAL HIGH (ref 70–99)
Potassium: 3.3 mmol/L — ABNORMAL LOW (ref 3.5–5.1)
Sodium: 140 mmol/L (ref 135–145)
Total Bilirubin: 0.4 mg/dL (ref <1.2)
Total Protein: 6.9 g/dL (ref 6.5–8.1)

## 2023-08-19 LAB — CBC
HCT: 38.3 % (ref 36.0–46.0)
Hemoglobin: 12.5 g/dL (ref 12.0–15.0)
MCH: 30.2 pg (ref 26.0–34.0)
MCHC: 32.6 g/dL (ref 30.0–36.0)
MCV: 92.5 fL (ref 80.0–100.0)
Platelets: 312 10*3/uL (ref 150–400)
RBC: 4.14 MIL/uL (ref 3.87–5.11)
RDW: 12.3 % (ref 11.5–15.5)
WBC: 7.4 10*3/uL (ref 4.0–10.5)
nRBC: 0 % (ref 0.0–0.2)

## 2023-08-19 LAB — URINALYSIS, ROUTINE W REFLEX MICROSCOPIC
Bacteria, UA: NONE SEEN
Bilirubin Urine: NEGATIVE
Glucose, UA: NEGATIVE mg/dL
Ketones, ur: NEGATIVE mg/dL
Leukocytes,Ua: NEGATIVE
Nitrite: NEGATIVE
Protein, ur: NEGATIVE mg/dL
Specific Gravity, Urine: 1.029 (ref 1.005–1.030)
pH: 5 (ref 5.0–8.0)

## 2023-08-19 LAB — RESP PANEL BY RT-PCR (RSV, FLU A&B, COVID)  RVPGX2
Influenza A by PCR: NEGATIVE
Influenza B by PCR: NEGATIVE
Resp Syncytial Virus by PCR: NEGATIVE
SARS Coronavirus 2 by RT PCR: NEGATIVE

## 2023-08-19 LAB — HCG, SERUM, QUALITATIVE: Preg, Serum: NEGATIVE

## 2023-08-19 LAB — LIPASE, BLOOD: Lipase: 33 U/L (ref 11–51)

## 2023-08-19 MED ORDER — ONDANSETRON 4 MG PO TBDP
4.0000 mg | ORAL_TABLET | Freq: Once | ORAL | Status: AC
  Administered 2023-08-19: 4 mg via ORAL
  Filled 2023-08-19: qty 1

## 2023-08-19 MED ORDER — OMEPRAZOLE 20 MG PO CPDR
20.0000 mg | DELAYED_RELEASE_CAPSULE | Freq: Every day | ORAL | 0 refills | Status: DC
Start: 2023-08-19 — End: 2024-06-05

## 2023-08-19 MED ORDER — METOCLOPRAMIDE HCL 10 MG PO TABS: 10.0000 mg | ORAL_TABLET | Freq: Four times a day (QID) | ORAL | 0 refills | Status: DC

## 2023-08-19 NOTE — ED Triage Notes (Signed)
[place] Spanish interpretor  used to assist with triage, Pt c/o abdominal pain, n/v, cough, fever x 2 days.   Reports hx of chronic abdominal pain and has an upcoming apt with a specialist.   Reports minimal relief with OTC medications.

## 2023-08-19 NOTE — Discharge Instructions (Addendum)
While you are in the emergency department, your labs done that were normal.  You can take Reglan every 6 hours for abdominal pain and nausea.  Take omeprazole each day.  Follow-up with your GI doctor in 2 weeks.

## 2023-08-19 NOTE — ED Provider Notes (Signed)
[place] EMERGENCY DEPARTMENT AT [facility] Provider Note   CSN: [id number] Arrival date & time: 08/19/23  1117     History  Chief Complaint  Patient presents with   Abdominal Pain   Nausea   Emesis   Cough    Lorraine [name] is a 37 y.o. female.  This is a 37 year old female is here today for chronic abdominal pain.  Patient has had 17 visits to the emergency department in the last 1 year.  She is here today for continued pain.  She has an appointment with her GI doctor on the 18th of this month.  She is not taking over-the-counter medications without any improvement.  Patient says that she has had her typical pain, has had intermittent nausea.  She is felt as though she has had a fever.   Abdominal Pain Associated symptoms: cough and vomiting   Emesis Associated symptoms: abdominal pain and cough   Cough      Home Medications Prior to Admission medications   Medication Sig Start Date End Date Taking? Authorizing Provider  metoCLOPramide (REGLAN) 10 MG tablet Take 1 tablet (10 mg total) by mouth every 6 (six) hours. 08/19/23  Yes Anders Simmonds T, DO  omeprazole (PRILOSEC) 20 MG capsule Take 1 capsule (20 mg total) by mouth daily. 08/19/23 09/18/23 Yes Anders Simmonds T, DO  albuterol (VENTOLIN HFA) 108 (90 Base) MCG/ACT inhaler Inhale 2 puffs into the lungs every 4 (four) hours as needed for wheezing or shortness of breath. 08/25/22   Bethann Berkshire, MD  dicyclomine (BENTYL) 20 MG tablet Take 1 tablet (20 mg total) by mouth 2 (two) times daily. Patient not taking: Reported on 05/26/2023 03/30/23   Henderly, Britni A, PA-C  nitrofurantoin, macrocrystal-monohydrate, (MACROBID) 100 MG capsule Take 1 capsule (100 mg total) by mouth 2 (two) times daily. Patient not taking: Reported on 08/02/2023 06/03/23   Estelle June A, DO  ondansetron (ZOFRAN) 4 MG tablet Take 1 tablet (4 mg total) by mouth every 6 (six) hours. Patient not taking: Reported on 08/02/2023 06/03/23    Estelle June A, DO  ondansetron (ZOFRAN-ODT) 4 MG disintegrating tablet Take 1 tablet (4 mg total) by mouth every 8 (eight) hours as needed for nausea or vomiting. Patient not taking: Reported on 05/26/2023 03/30/23   Henderly, Britni A, PA-C  ondansetron (ZOFRAN-ODT) 4 MG disintegrating tablet Take 1 tablet (4 mg total) by mouth every 8 (eight) hours as needed for nausea or vomiting. Patient not taking: Reported on 08/02/2023 05/26/23   Mannie Stabile, PA-C  pantoprazole (PROTONIX) 20 MG tablet Take 1 tablet (20 mg total) by mouth daily for 14 days. 07/25/23 08/08/23  Coral Spikes, DO  sucralfate (CARAFATE) 1 GM/10ML suspension Take 10 mLs (1 g total) by mouth 4 (four) times daily -  with meals and at bedtime. 08/02/23   Nira Conn, MD  cetirizine (ZYRTEC ALLERGY) 10 MG tablet Take 1 tablet (10 mg total) by mouth 2 (two) times daily. Patient not taking: Reported on 12/06/2020 11/12/20 01/24/21  Muthersbaugh, Dahlia Client, PA-C      Allergies    Iodinated contrast media, Lupine bean extract, Ceftriaxone, and Morphine    Review of Systems   Review of Systems  Respiratory:  Positive for cough.   Gastrointestinal:  Positive for abdominal pain and vomiting.    Physical Exam Updated Vital Signs BP (!) 138/126 (BP Location: Right Arm)   Pulse (!) 109   Temp 98.2 F (36.8 C)   Resp 18  Ht 5' (1.524 m)   Wt 77.1 kg   LMP 08/14/2023 (Within Days)   SpO2 99%   BMI 33.20 kg/m  Physical Exam Vitals reviewed.  HENT:     Head: Normocephalic.  Cardiovascular:     Rate and Rhythm: Normal rate.  Pulmonary:     Effort: Pulmonary effort is normal.  Abdominal:     General: Abdomen is flat.     Palpations: Abdomen is soft.     Tenderness: There is no abdominal tenderness.  Skin:    General: Skin is warm.  Neurological:     Mental Status: She is alert.     ED Results / Procedures / Treatments   Labs (all labs ordered are listed, but only abnormal results are displayed) Labs  Reviewed  COMPREHENSIVE METABOLIC PANEL - Abnormal; Notable for the following components:      Result Value   Potassium 3.3 (*)    Glucose, Bld 118 (*)    BUN 21 (*)    All other components within normal limits  URINALYSIS, ROUTINE W REFLEX MICROSCOPIC - Abnormal; Notable for the following components:   APPearance HAZY (*)    Hgb urine dipstick SMALL (*)    All other components within normal limits  RESP PANEL BY RT-PCR (RSV, FLU A&B, COVID)  RVPGX2  LIPASE, BLOOD  CBC  HCG, SERUM, QUALITATIVE    EKG None  Radiology No results found.  Procedures Procedures    Medications Ordered in ED Medications  ondansetron (ZOFRAN-ODT) disintegrating tablet 4 mg (has no administration in time range)    ED Course/ Medical Decision Making/ A&P                                 Medical Decision Making 37 year old female is here today with abdominal pain.  Differential diagnoses include chronic abdominal pain, less likely acute intra-abdominal infection, less likely obstruction.  Plan- patient with a soft abdomen, no tenderness.  She had labs drawn at triage which did not show any significant abnormalities.  Will symptomatically manage the patient.  Will treat her with omeprazole, Reglan.  She will follow-up with GI.  Return precaution discussed with patient at bedside.  Spanish interpreter present.    Amount and/or Complexity of Data Reviewed Labs: ordered.  Risk Prescription drug management.           Final Clinical Impression(s) / ED Diagnoses Final diagnoses:  Generalized abdominal pain    Rx / DC Orders ED Discharge Orders          Ordered    metoCLOPramide (REGLAN) 10 MG tablet  Every 6 hours        08/19/23 1341    omeprazole (PRILOSEC) 20 MG capsule  Daily        08/19/23 1341              Anders Simmonds T, DO 08/19/23 1341

## 2023-08-20 ENCOUNTER — Other Ambulatory Visit: Payer: Self-pay

## 2023-08-20 ENCOUNTER — Emergency Department (HOSPITAL_COMMUNITY)
Admission: EM | Admit: 2023-08-20 | Discharge: 2023-08-20 | Disposition: A | Discharge status: Home / Self Care | Payer: Self-pay | Attending: Emergency Medicine | Admitting: Emergency Medicine

## 2023-08-20 ENCOUNTER — Encounter (HOSPITAL_COMMUNITY): Payer: Self-pay

## 2023-08-20 DIAGNOSIS — R1012 Left upper quadrant pain: Secondary | ICD-10-CM | POA: Insufficient documentation

## 2023-08-20 DIAGNOSIS — E876 Hypokalemia: Secondary | ICD-10-CM

## 2023-08-20 DIAGNOSIS — R1013 Epigastric pain: Secondary | ICD-10-CM | POA: Insufficient documentation

## 2023-08-20 DIAGNOSIS — R112 Nausea with vomiting, unspecified: Secondary | ICD-10-CM

## 2023-08-20 DIAGNOSIS — G8929 Other chronic pain: Secondary | ICD-10-CM

## 2023-08-20 DIAGNOSIS — R1011 Right upper quadrant pain: Secondary | ICD-10-CM | POA: Insufficient documentation

## 2023-08-20 DIAGNOSIS — J45909 Unspecified asthma, uncomplicated: Secondary | ICD-10-CM | POA: Insufficient documentation

## 2023-08-20 LAB — CBC WITH DIFFERENTIAL/PLATELET
Abs Immature Granulocytes: 0.01 10*3/uL (ref 0.00–0.07)
Basophils Absolute: 0.1 10*3/uL (ref 0.0–0.1)
Basophils Relative: 1 %
Eosinophils Absolute: 0.1 10*3/uL (ref 0.0–0.5)
Eosinophils Relative: 2 %
HCT: 39.3 % (ref 36.0–46.0)
Hemoglobin: 12.8 g/dL (ref 12.0–15.0)
Immature Granulocytes: 0 %
Lymphocytes Relative: 39 %
Lymphs Abs: 3.1 10*3/uL (ref 0.7–4.0)
MCH: 30.4 pg (ref 26.0–34.0)
MCHC: 32.6 g/dL (ref 30.0–36.0)
MCV: 93.3 fL (ref 80.0–100.0)
Monocytes Absolute: 0.5 10*3/uL (ref 0.1–1.0)
Monocytes Relative: 7 %
Neutro Abs: 4.2 10*3/uL (ref 1.7–7.7)
Neutrophils Relative %: 51 %
Platelets: 302 10*3/uL (ref 150–400)
RBC: 4.21 MIL/uL (ref 3.87–5.11)
RDW: 12.2 % (ref 11.5–15.5)
WBC: 8.1 10*3/uL (ref 4.0–10.5)
nRBC: 0 % (ref 0.0–0.2)

## 2023-08-20 LAB — COMPREHENSIVE METABOLIC PANEL
ALT: 28 U/L (ref 0–44)
AST: 27 U/L (ref 15–41)
Albumin: 4.1 g/dL (ref 3.5–5.0)
Alkaline Phosphatase: 118 U/L (ref 38–126)
Anion gap: 7 (ref 5–15)
BUN: 18 mg/dL (ref 6–20)
CO2: 26 mmol/L (ref 22–32)
Calcium: 8.9 mg/dL (ref 8.9–10.3)
Chloride: 101 mmol/L (ref 98–111)
Creatinine, Ser: 0.81 mg/dL (ref 0.44–1.00)
GFR, Estimated: >60 mL/min (ref >60)
Glucose, Bld: 103 mg/dL — ABNORMAL HIGH (ref 70–99)
Potassium: 3.2 mmol/L — ABNORMAL LOW (ref 3.5–5.1)
Sodium: 134 mmol/L — ABNORMAL LOW (ref 135–145)
Total Bilirubin: 0.3 mg/dL (ref <1.2)
Total Protein: 7.3 g/dL (ref 6.5–8.1)

## 2023-08-20 LAB — LIPASE, BLOOD: Lipase: 34 U/L (ref 11–51)

## 2023-08-20 MED ORDER — DICYCLOMINE HCL 10 MG PO CAPS
10.0000 mg | ORAL_CAPSULE | Freq: Once | ORAL | Status: AC
  Administered 2023-08-20: 10 mg via ORAL
  Filled 2023-08-20: qty 1

## 2023-08-20 MED ORDER — METOCLOPRAMIDE HCL 5 MG/ML IJ SOLN
10.0000 mg | Freq: Once | INTRAMUSCULAR | Status: AC
Start: 2023-08-20 — End: 2023-08-20
  Administered 2023-08-20: 10 mg via INTRAVENOUS
  Filled 2023-08-20: qty 2

## 2023-08-20 MED ORDER — DICYCLOMINE HCL 20 MG PO TABS: 20.0000 mg | ORAL_TABLET | Freq: Two times a day (BID) | ORAL | 0 refills | Status: DC

## 2023-08-20 MED ORDER — LACTATED RINGERS IV BOLUS
1000.0000 mL | Freq: Once | INTRAVENOUS | Status: AC
  Administered 2023-08-20: 1000 mL via INTRAVENOUS

## 2023-08-20 MED ORDER — ALUM & MAG HYDROXIDE-SIMETH 200-200-20 MG/5ML PO SUSP
30.0000 mL | Freq: Once | ORAL | Status: AC
  Administered 2023-08-20: 30 mL via ORAL
  Filled 2023-08-20: qty 30

## 2023-08-20 MED ORDER — ONDANSETRON HCL 4 MG/2ML IJ SOLN
4.0000 mg | Freq: Once | INTRAMUSCULAR | Status: AC
Start: 2023-08-20 — End: 2023-08-20
  Administered 2023-08-20: 4 mg via INTRAVENOUS
  Filled 2023-08-20: qty 2

## 2023-08-20 MED ORDER — FAMOTIDINE IN NACL 20-0.9 MG/50ML-% IV SOLN
20.0000 mg | Freq: Once | INTRAVENOUS | Status: AC
  Administered 2023-08-20: 20 mg via INTRAVENOUS
  Filled 2023-08-20: qty 50

## 2023-08-20 MED ORDER — POTASSIUM CHLORIDE CRYS ER 20 MEQ PO TBCR
40.0000 meq | EXTENDED_RELEASE_TABLET | Freq: Once | ORAL | Status: AC
  Administered 2023-08-20: 40 meq via ORAL
  Filled 2023-08-20: qty 2

## 2023-08-20 MED ORDER — ACETAMINOPHEN 325 MG PO TABS
650.0000 mg | ORAL_TABLET | Freq: Once | ORAL | Status: AC
  Administered 2023-08-20: 650 mg via ORAL
  Filled 2023-08-20: qty 2

## 2023-08-20 NOTE — ED Notes (Signed)
IV attempt x2.

## 2023-08-20 NOTE — Discharge Instructions (Signed)
Are seen in the emergency department for your nausea and your abdominal pain.  Your workup showed no abnormality with your liver or kidneys and no severe dehydration.  Your potassium was mildly low which we repleted for you in the ER.  You should make sure that you are taking your antacid every day as prescribed and can take your nausea medications as needed.  You can take Tylenol or Bentyl as needed for abdominal pain or cramps and can take Maalox and Tums as needed for additional acid reflux pain.  You can follow-up with your GI doctor as scheduled.  You should return to the emergency department for repetitive vomiting despite your nausea medication, significantly worsening pain or any other new or concerning symptoms.  Acude al servicio de urgencias por nuseas y dolor abdominal. Los anlisis no muestran ninguna anomala en el hgado o los riones, ni deshidratacin grave. El potasio estaba ligeramente bajo, que le reponemos en urgencias. Debe asegurarse de tomar su anticido [employer] segn lo prescrito y puede tomar sus medicamentos para las nuseas segn sea necesario. Puede tomar Tylenol o Bentyl segn sea necesario para el dolor o los calambres abdominales y puede tomar Maalox y Tums segn sea necesario para el dolor adicional por reflujo cido. Puede hacer un seguimiento con su gastroenterlogo segn lo programado. Debe regresar al servicio de urgencias si vomita repetidamente a pesar de tomar su medicamento para las nuseas, si el dolor empeora significativamente o si tiene otros sntomas nuevos o preocupantes.

## 2023-08-20 NOTE — ED Triage Notes (Signed)
C/o generalized abd pain with n/v that started this am.  Denies diarrhea.  Patient seen for same yesterday  Denies urinary s/sy

## 2023-08-20 NOTE — ED Notes (Signed)
Pt tolerated PO challenge

## 2023-08-20 NOTE — ED Notes (Signed)
Gave pt some water, instructed her to drink after 15 mins.

## 2023-08-20 NOTE — ED Provider Notes (Signed)
[place] EMERGENCY DEPARTMENT AT [facility] Provider Note   CSN: [id number] Arrival date & time: 08/20/23  1314     History  Chief Complaint  Patient presents with   Abdominal Pain    Lorraine [name] is a 37 y.o. female.  Patient is a 37 year old female with a past medical history of asthma and chronic abdominal pain presenting to the emergency department with worsening of her pain.  She states that her pain is across her epigastrium and is worse in the right upper quadrant.  She states that yesterday she developed nausea and vomiting.  She states that she is not had any bowel movement in the last day but states that she has not been able to eat or drink anything.  She states that she felt feverish at home but has not checked her temperature.  She denies any dysuria or hematuria.  She states she has had a prior cholecystectomy.  She denies any alcohol or illicit substance use.  He states she does have GI follow-up on December 18.  The history is provided by the patient. A language interpreter was used (Spanish ID number [id number]).  Abdominal Pain      Home Medications Prior to Admission medications   Medication Sig Start Date End Date Taking? Authorizing Provider  albuterol (VENTOLIN HFA) 108 (90 Base) MCG/ACT inhaler Inhale 2 puffs into the lungs every 4 (four) hours as needed for wheezing or shortness of breath. 08/25/22   Bethann Berkshire, MD  dicyclomine (BENTYL) 20 MG tablet Take 1 tablet (20 mg total) by mouth 2 (two) times daily. 08/20/23   Rexford Maus, DO  metoCLOPramide (REGLAN) 10 MG tablet Take 1 tablet (10 mg total) by mouth every 6 (six) hours. 08/19/23   Arletha Pili, DO  nitrofurantoin, macrocrystal-monohydrate, (MACROBID) 100 MG capsule Take 1 capsule (100 mg total) by mouth 2 (two) times daily. Patient not taking: Reported on 08/02/2023 06/03/23   Royanne Foots, DO  omeprazole (PRILOSEC) 20 MG capsule Take 1 capsule (20 mg total) by mouth  daily. 08/19/23 09/18/23  Anders Simmonds T, DO  ondansetron (ZOFRAN) 4 MG tablet Take 1 tablet (4 mg total) by mouth every 6 (six) hours. Patient not taking: Reported on 08/02/2023 06/03/23   Estelle June A, DO  ondansetron (ZOFRAN-ODT) 4 MG disintegrating tablet Take 1 tablet (4 mg total) by mouth every 8 (eight) hours as needed for nausea or vomiting. Patient not taking: Reported on 05/26/2023 03/30/23   Henderly, Britni A, PA-C  ondansetron (ZOFRAN-ODT) 4 MG disintegrating tablet Take 1 tablet (4 mg total) by mouth every 8 (eight) hours as needed for nausea or vomiting. Patient not taking: Reported on 08/02/2023 05/26/23   Mannie Stabile, PA-C  pantoprazole (PROTONIX) 20 MG tablet Take 1 tablet (20 mg total) by mouth daily for 14 days. 07/25/23 08/08/23  Coral Spikes, DO  sucralfate (CARAFATE) 1 GM/10ML suspension Take 10 mLs (1 g total) by mouth 4 (four) times daily -  with meals and at bedtime. 08/02/23   Nira Conn, MD  cetirizine (ZYRTEC ALLERGY) 10 MG tablet Take 1 tablet (10 mg total) by mouth 2 (two) times daily. Patient not taking: Reported on 12/06/2020 11/12/20 01/24/21  Muthersbaugh, Dahlia Client, PA-C      Allergies    Iodinated contrast media, Lupine bean extract, Ceftriaxone, and Morphine    Review of Systems   Review of Systems  Gastrointestinal:  Positive for abdominal pain.    Physical Exam Updated Vital Signs  BP 124/74   Pulse 83   Temp 98.1 F (36.7 C) (Oral)   Resp 17   Ht 5' (1.524 m)   Wt 77 kg   LMP 08/14/2023 (Within Days)   SpO2 99%   BMI 33.15 kg/m  Physical Exam Vitals and nursing note reviewed.  Constitutional:      General: She is not in acute distress.    Appearance: She is well-developed.  HENT:     Head: Normocephalic.     Mouth/Throat:     Mouth: Mucous membranes are moist.     Pharynx: Oropharynx is clear.  Eyes:     Extraocular Movements: Extraocular movements intact.  Cardiovascular:     Rate and Rhythm: Normal rate and regular  rhythm.  Pulmonary:     Effort: Pulmonary effort is normal.     Breath sounds: Normal breath sounds.  Abdominal:     General: Abdomen is flat.     Palpations: Abdomen is soft.     Tenderness: There is abdominal tenderness (Mild) in the right upper quadrant, epigastric area and left upper quadrant. There is no guarding or rebound.  Skin:    General: Skin is warm and dry.  Neurological:     General: No focal deficit present.     Mental Status: She is alert and oriented to person, place, and time.  Psychiatric:        Mood and Affect: Mood normal.        Behavior: Behavior normal.     ED Results / Procedures / Treatments   Labs (all labs ordered are listed, but only abnormal results are displayed) Labs Reviewed  COMPREHENSIVE METABOLIC PANEL - Abnormal; Notable for the following components:      Result Value   Sodium 134 (*)    Potassium 3.2 (*)    Glucose, Bld 103 (*)    All other components within normal limits  LIPASE, BLOOD  CBC WITH DIFFERENTIAL/PLATELET    EKG None  Radiology No results found.  Procedures Procedures    Medications Ordered in ED Medications  lactated ringers bolus 1,000 mL (0 mLs Intravenous Stopped 08/20/23 1932)  ondansetron (ZOFRAN) injection 4 mg (4 mg Intravenous Given 08/20/23 1624)  famotidine (PEPCID) IVPB 20 mg premix (0 mg Intravenous Stopped 08/20/23 1816)  alum & mag hydroxide-simeth (MAALOX/MYLANTA) 200-200-20 MG/5ML suspension 30 mL (30 mLs Oral Given 08/20/23 1623)  dicyclomine (BENTYL) capsule 10 mg (10 mg Oral Given 08/20/23 1623)  potassium chloride SA (KLOR-CON M) CR tablet 40 mEq (40 mEq Oral Given 08/20/23 1855)  metoCLOPramide (REGLAN) injection 10 mg (10 mg Intravenous Given 08/20/23 1855)  acetaminophen (TYLENOL) tablet 650 mg (650 mg Oral Given 08/20/23 1853)    ED Course/ Medical Decision Making/ A&P Clinical Course as of 08/20/23 2025  Tue Aug 20, 2023  1830 Mild hypokalemia will be repleted, otherwise no signficant lab  abnormalities. [VK]  1845 Patient reports some improvement of pain but still having significant nausea. Will be given reglan and will have PO trial. [VK]  2022 Patient has tolerated PO and reports improvement of symptoms. She is stable for discharge home. Reports she has nausea medications at home and has been taking her antacids daily. Recommended to follow up with GI as planned. [VK]    Clinical Course User Index [VK] Rexford Maus, DO  Medical Decision Making This patient presents to the ED with chief complaint(s) of abdominal pain with pertinent past medical history of asthma, chronic abd pain which further complicates the presenting complaint. The complaint involves an extensive differential diagnosis and also carries with it a high risk of complications and morbidity.    The differential diagnosis includes dehydration, electrolyte abnormality, hepatitis, pancreatitis, gastritis, GERD, chronic pain  Additional history obtained: Additional history obtained from N/A Records reviewed outpatient GI records, 8 ED visits in the last month with similar complaints  ED Course and Reassessment: On patient's arrival she is hemodynamically stable in no acute distress.  Exam appears consistent with similar visits over the last month.  Last CT imaging in September showed no acute abnormality to explain symptoms.  She has already had a cholecystectomy.  Patient will have repeat labs performed and will be treated symptomatically with Zofran and GI cocktail and will be closely reassessed.  Independent labs interpretation:  The following labs were independently interpreted: mild hypokalemia otherwise within normal range  Independent visualization of imaging: - N/A  Consultation: - Consulted or discussed management/test interpretation w/ external professional: N/A  Consideration for admission or further workup: Patient has no emergent conditions requiring  admission or further work-up at this time and is stable for discharge home with GI follow-up  Social Determinants of health: N/A    Amount and/or Complexity of Data Reviewed Labs: ordered.  Risk OTC drugs. Prescription drug management.          Final Clinical Impression(s) / ED Diagnoses Final diagnoses:  Nausea and vomiting, unspecified vomiting type  Hypokalemia  Chronic abdominal pain    Rx / DC Orders ED Discharge Orders          Ordered    dicyclomine (BENTYL) 20 MG tablet  2 times daily        08/20/23 2023              Rexford Maus, DO 08/20/23 2025

## 2023-09-03 ENCOUNTER — Emergency Department (HOSPITAL_COMMUNITY): Payer: Self-pay

## 2023-09-03 ENCOUNTER — Encounter (HOSPITAL_COMMUNITY): Payer: Self-pay

## 2023-09-03 ENCOUNTER — Emergency Department (HOSPITAL_COMMUNITY)
Admission: EM | Admit: 2023-09-03 | Discharge: 2023-09-03 | Disposition: A | Discharge status: Home / Self Care | Payer: Self-pay | Attending: Emergency Medicine | Admitting: Emergency Medicine

## 2023-09-03 ENCOUNTER — Other Ambulatory Visit: Payer: Self-pay

## 2023-09-03 DIAGNOSIS — R0789 Other chest pain: Secondary | ICD-10-CM | POA: Insufficient documentation

## 2023-09-03 DIAGNOSIS — J029 Acute pharyngitis, unspecified: Secondary | ICD-10-CM | POA: Insufficient documentation

## 2023-09-03 DIAGNOSIS — R1084 Generalized abdominal pain: Secondary | ICD-10-CM | POA: Insufficient documentation

## 2023-09-03 DIAGNOSIS — R350 Frequency of micturition: Secondary | ICD-10-CM | POA: Insufficient documentation

## 2023-09-03 DIAGNOSIS — J45909 Unspecified asthma, uncomplicated: Secondary | ICD-10-CM | POA: Insufficient documentation

## 2023-09-03 DIAGNOSIS — Z20822 Contact with and (suspected) exposure to covid-19: Secondary | ICD-10-CM | POA: Insufficient documentation

## 2023-09-03 LAB — URINALYSIS, ROUTINE W REFLEX MICROSCOPIC
Bilirubin Urine: NEGATIVE
Glucose, UA: NEGATIVE mg/dL
Hgb urine dipstick: NEGATIVE
Ketones, ur: NEGATIVE mg/dL
Leukocytes,Ua: NEGATIVE
Nitrite: NEGATIVE
Protein, ur: NEGATIVE mg/dL
Specific Gravity, Urine: 1.027 (ref 1.005–1.030)
pH: 6 (ref 5.0–8.0)

## 2023-09-03 LAB — CBC WITH DIFFERENTIAL/PLATELET
Abs Immature Granulocytes: 0.04 10*3/uL (ref 0.00–0.07)
Basophils Absolute: 0.1 10*3/uL (ref 0.0–0.1)
Basophils Relative: 1 %
Eosinophils Absolute: 0.1 10*3/uL (ref 0.0–0.5)
Eosinophils Relative: 1 %
HCT: 35.8 % — ABNORMAL LOW (ref 36.0–46.0)
Hemoglobin: 12 g/dL (ref 12.0–15.0)
Immature Granulocytes: 0 %
Lymphocytes Relative: 33 %
Lymphs Abs: 2.9 10*3/uL (ref 0.7–4.0)
MCH: 31 pg (ref 26.0–34.0)
MCHC: 33.5 g/dL (ref 30.0–36.0)
MCV: 92.5 fL (ref 80.0–100.0)
Monocytes Absolute: 0.5 10*3/uL (ref 0.1–1.0)
Monocytes Relative: 5 %
Neutro Abs: 5.3 10*3/uL (ref 1.7–7.7)
Neutrophils Relative %: 60 %
Platelets: 280 10*3/uL (ref 150–400)
RBC: 3.87 MIL/uL (ref 3.87–5.11)
RDW: 12.9 % (ref 11.5–15.5)
WBC: 8.9 10*3/uL (ref 4.0–10.5)
nRBC: 0 % (ref 0.0–0.2)

## 2023-09-03 LAB — RESP PANEL BY RT-PCR (RSV, FLU A&B, COVID)  RVPGX2
Influenza A by PCR: NEGATIVE
Influenza B by PCR: NEGATIVE
Resp Syncytial Virus by PCR: NEGATIVE
SARS Coronavirus 2 by RT PCR: NEGATIVE

## 2023-09-03 LAB — TROPONIN I (HIGH SENSITIVITY): Troponin I (High Sensitivity): <2 ng/L (ref <18)

## 2023-09-03 LAB — GROUP A STREP BY PCR: Group A Strep by PCR: NOT DETECTED

## 2023-09-03 LAB — LIPASE, BLOOD: Lipase: 33 U/L (ref 11–51)

## 2023-09-03 MED ORDER — ONDANSETRON HCL 4 MG/2ML IJ SOLN
4.0000 mg | Freq: Once | INTRAMUSCULAR | Status: AC
  Administered 2023-09-03: 4 mg via INTRAVENOUS
  Filled 2023-09-03: qty 2

## 2023-09-03 MED ORDER — ALUM & MAG HYDROXIDE-SIMETH 200-200-20 MG/5ML PO SUSP
30.0000 mL | Freq: Once | ORAL | Status: AC
  Administered 2023-09-03: 30 mL via ORAL
  Filled 2023-09-03: qty 30

## 2023-09-03 NOTE — ED Triage Notes (Signed)
Patient has had a sore throat since last night. Burns to swallow.

## 2023-09-03 NOTE — Discharge Instructions (Signed)
It was a pleasure taking care of you today.  You were evaluated in the emergency room for sore throat, chest tightness, abdominal pain and increased urinary frequency.  Your lab work was all normal.  You you were provided resources to establish with a primary care doctor in the area.  Please call make an appointment within the next 5 days.  If you experience any new or worsening symptoms including sudden worsening chest pain, difficulty breathing, fevers, vomiting please return to the emergency room.

## 2023-09-03 NOTE — ED Provider Triage Note (Signed)
Emergency Medicine Provider Triage Evaluation Note  Lorraine [name] , a 37 y.o. female  was evaluated in triage.  Pt complains of sore throat.  Symptoms started yesterday.  Patient also reports a tightness in the upper chest that is causing some shortness of breath.  No cough.  She had left ear pain this morning that has resolved.  Review of Systems  Positive: Sore throat Negative: Fever  Physical Exam  BP 123/64   Pulse 92   Temp 98.4 F (36.9 C) (Oral)   Resp 18   Ht 5' (1.524 m)   Wt 77.1 kg   LMP 08/14/2023 (Within Days)   SpO2 98%   BMI 33.20 kg/m  Gen:   Awake, no distress   Resp:  Normal effort  MSK:   Moves extremities without difficulty  Other:  Full range of motion of neck, no trismus, pharyngitis actually is fairly clear.  Lungs are clear to auscultation bilaterally.  Medical Decision Making  Medically screening exam initiated at 12:52 PM.  Appropriate orders placed.  Lorraine [name] was informed that the remainder of the evaluation will be completed by another provider, this initial triage assessment does not replace that evaluation, and the importance of remaining in the ED until their evaluation is complete.  Strep test pending.  Will check chest x-ray given reported chest tightness and exam.   Renne Crigler, PA-C 09/03/23 1253

## 2023-09-03 NOTE — ED Notes (Signed)
Lab said to recollect the light green. It hemolyzed.

## 2023-09-03 NOTE — ED Provider Notes (Signed)
[place] EMERGENCY DEPARTMENT AT [facility] Provider Note   CSN: [id number] Arrival date & time: 09/03/23  1228     History  Chief Complaint  Patient presents with   Sore Throat    Lorraine [name] is a 37 y.o. female with history of chronic abdominal pain status post cholecystectomy, diverticulitis, PUD who presents with multiple complaints including sore throat, chest tightness and left lower quadrant abdominal pain, and dysuria increased frequency.  The sore throat and chest tightness started last night.  She denies any cough or congestion.  No one else is sick at home.  She does endorse some shortness of breath.  Has a history of asthma, has had no improvement with use of inhaler.  No fevers or chills.  Abdominal pain has been ongoing for the past 4 days, reported to be constant.  No vomiting or diarrhea.  Patient reports that she has had peptic ulcer disease in the past with similar symptoms.  Urinary symptoms have been ongoing for the past week.  History of blood clot, not on birth control.   Sore Throat Associated symptoms include abdominal pain.   Past Medical History:  Diagnosis Date   Angio-edema    Asthma    Inhaler used 01/02/16   Diverticulitis    Nausea & vomiting 04/02/2017   Pancreatitis    Pre-diabetes    Pyelonephritis    Urticaria        Home Medications Prior to Admission medications   Medication Sig Start Date End Date Taking? Authorizing Provider  albuterol (VENTOLIN HFA) 108 (90 Base) MCG/ACT inhaler Inhale 2 puffs into the lungs every 4 (four) hours as needed for wheezing or shortness of breath. 08/25/22   Bethann Berkshire, MD  dicyclomine (BENTYL) 20 MG tablet Take 1 tablet (20 mg total) by mouth 2 (two) times daily. 08/20/23   Rexford Maus, DO  metoCLOPramide (REGLAN) 10 MG tablet Take 1 tablet (10 mg total) by mouth every 6 (six) hours. 08/19/23   Arletha Pili, DO  nitrofurantoin, macrocrystal-monohydrate, (MACROBID) 100 MG  capsule Take 1 capsule (100 mg total) by mouth 2 (two) times daily. Patient not taking: Reported on 08/02/2023 06/03/23   Royanne Foots, DO  omeprazole (PRILOSEC) 20 MG capsule Take 1 capsule (20 mg total) by mouth daily. 08/19/23 09/18/23  Anders Simmonds T, DO  ondansetron (ZOFRAN) 4 MG tablet Take 1 tablet (4 mg total) by mouth every 6 (six) hours. Patient not taking: Reported on 08/02/2023 06/03/23   Estelle June A, DO  ondansetron (ZOFRAN-ODT) 4 MG disintegrating tablet Take 1 tablet (4 mg total) by mouth every 8 (eight) hours as needed for nausea or vomiting. Patient not taking: Reported on 05/26/2023 03/30/23   Henderly, Britni A, PA-C  ondansetron (ZOFRAN-ODT) 4 MG disintegrating tablet Take 1 tablet (4 mg total) by mouth every 8 (eight) hours as needed for nausea or vomiting. Patient not taking: Reported on 08/02/2023 05/26/23   Mannie Stabile, PA-C  pantoprazole (PROTONIX) 20 MG tablet Take 1 tablet (20 mg total) by mouth daily for 14 days. 07/25/23 08/08/23  Coral Spikes, DO  sucralfate (CARAFATE) 1 GM/10ML suspension Take 10 mLs (1 g total) by mouth 4 (four) times daily -  with meals and at bedtime. 08/02/23   Nira Conn, MD  cetirizine (ZYRTEC ALLERGY) 10 MG tablet Take 1 tablet (10 mg total) by mouth 2 (two) times daily. Patient not taking: Reported on 12/06/2020 11/12/20 01/24/21  Muthersbaugh, Dahlia Client, PA-C  Allergies    Iodinated contrast media, Lupine bean extract, Ceftriaxone, and Morphine    Review of Systems   Review of Systems  HENT:  Positive for sore throat.   Respiratory:  Positive for chest tightness.   Gastrointestinal:  Positive for abdominal pain.    Physical Exam Updated Vital Signs BP 131/87   Pulse 70   Temp 98.4 F (36.9 C) (Oral)   Resp 20   Ht 5' (1.524 m)   Wt 77.1 kg   LMP 08/14/2023 (Within Days)   SpO2 100%   BMI 33.20 kg/m  Physical Exam Vitals and nursing note reviewed.  Constitutional:      General: She is not in acute  distress.    Appearance: She is well-developed.  HENT:     Head: Normocephalic and atraumatic.     Mouth/Throat:     Mouth: No oral lesions.     Pharynx: No pharyngeal swelling or oropharyngeal exudate.     Tonsils: No tonsillar exudate.  Eyes:     Conjunctiva/sclera: Conjunctivae normal.  Cardiovascular:     Rate and Rhythm: Normal rate and regular rhythm.     Heart sounds: No murmur heard. Pulmonary:     Effort: Pulmonary effort is normal. No respiratory distress.     Breath sounds: Normal breath sounds.  Abdominal:     Palpations: Abdomen is soft.     Tenderness: There is no guarding or rebound.     Comments: Mild right upper quadrant and left lower quadrant tenderness  Musculoskeletal:        General: No swelling.     Cervical back: Neck supple.  Skin:    General: Skin is warm and dry.     Capillary Refill: Capillary refill takes less than 2 seconds.  Neurological:     Mental Status: She is alert.  Psychiatric:        Mood and Affect: Mood normal.     ED Results / Procedures / Treatments   Labs (all labs ordered are listed, but only abnormal results are displayed) Labs Reviewed  CBC WITH DIFFERENTIAL/PLATELET - Abnormal; Notable for the following components:      Result Value   HCT 35.8 (*)    All other components within normal limits  GROUP A STREP BY PCR  RESP PANEL BY RT-PCR (RSV, FLU A&B, COVID)  RVPGX2  LIPASE, BLOOD  URINALYSIS, ROUTINE W REFLEX MICROSCOPIC  COMPREHENSIVE METABOLIC PANEL  TROPONIN I (HIGH SENSITIVITY)  TROPONIN I (HIGH SENSITIVITY)    EKG None  Radiology DG Chest 2 View Result Date: 09/03/2023 CLINICAL DATA:  Sore throat and upper chest tightness. EXAM: CHEST - 2 VIEW COMPARISON:  07/19/2023 FINDINGS: Mild right hemidiaphragm elevation. Midline trachea.  Normal heart size and mediastinal contours. Sharp costophrenic angles.  No pneumothorax.  Clear lungs. IMPRESSION: No active cardiopulmonary disease. Electronically Signed   By: Jeronimo Greaves M.D.   On: 09/03/2023 15:03    Procedures Procedures    Medications Ordered in ED Medications  alum & mag hydroxide-simeth (MAALOX/MYLANTA) 200-200-20 MG/5ML suspension 30 mL (30 mLs Oral Given 09/03/23 1612)  ondansetron (ZOFRAN) injection 4 mg (4 mg Intravenous Given 09/03/23 1612)    ED Course/ Medical Decision Making/ A&P                                 Medical Decision Making  This patient presents to the ED with chief complaint(s) of numerous complaints,  mostly concerned with left lower quadrant pain.  The complaint involves an extensive differential diagnosis and also carries with it a high risk of complications and morbidity.   pertinent past medical history as listed in HPI  The differential diagnosis includes  ACS, PE, PUD, diverticulitis, UTI The initial plan is to  Will obtain respiratory panel, strep test, chest x-ray and basic labs Additional history obtained: No additional historians Records reviewed previous admission documents  Initial Assessment:   Patient is hemodynamically stable, afebrile, nontoxic-appearing, sitting comfortably.  Presenting with numerous complaints, seems to be more concerned with left lower quadrant pain she does have a history of diverticulitis and PUD.  However her exam is more notable for right upper quadrant tenderness.  She does have a history of chronic abdominal pain status post cholecystectomy.  She endorses chest tightness.  She does have reproducible chest tenderness on exam.  No cardiac history.  She is PERC negative.  It appears that she has had nearly 20 ED visits in the past year for similar complaints with negative workups.  Independent ECG interpretation:  Sinus rhythm  Independent labs interpretation:  The following labs were independently interpreted:  Negative respiratory panel, negative strep, CBC without significant abnormality,  Independent visualization and interpretation of imaging: I independently  visualized the following imaging with scope of interpretation limited to determining acute life threatening conditions related to emergency care: CXR, which revealed no active cardiopulmonary disease  Treatment and Reassessment: Patient given GI cocktail and Zofran following first assessment  6:45 PM upon reassessment patient reports improvement of symptoms.  Discussed discharge plan, patient is agreeable.  Consultations obtained:   none  Disposition:   Patient discharged home.  Provided primary care resources and encouraged to follow-up with within the next 5 days. The patient has been appropriately medically screened and/or stabilized in the ED. I have low suspicion for any other emergent medical condition which would require further screening, evaluation or treatment in the ED or require inpatient management. At time of discharge the patient is hemodynamically stable and in no acute distress. I have discussed work-up results and diagnosis with patient and answered all questions. Patient is agreeable with discharge plan. We discussed strict return precautions for returning to the emergency department and they verbalized understanding.     Social Determinants of Health:   Patient's impaired access to primary care  increases the complexity of managing their presentation  This note was dictated with voice recognition software.  Despite best efforts at proofreading, errors may have occurred which can change the documentation meaning.          Final Clinical Impression(s) / ED Diagnoses Final diagnoses:  Sore throat  Atypical chest pain  Generalized abdominal pain  Urinary frequency    Rx / DC Orders ED Discharge Orders     None         Halford Decamp, PA-C 09/03/23 1854    Glyn Ade, MD 09/13/23 [phone]

## 2023-09-11 ENCOUNTER — Emergency Department (HOSPITAL_COMMUNITY)
Admission: EM | Admit: 2023-09-11 | Discharge: 2023-09-11 | Disposition: A | Discharge status: Home / Self Care | Payer: Self-pay | Attending: Emergency Medicine | Admitting: Emergency Medicine

## 2023-09-11 ENCOUNTER — Emergency Department (HOSPITAL_COMMUNITY): Payer: Self-pay

## 2023-09-11 ENCOUNTER — Other Ambulatory Visit: Payer: Self-pay

## 2023-09-11 ENCOUNTER — Encounter (HOSPITAL_COMMUNITY): Payer: Self-pay | Admitting: Radiology

## 2023-09-11 DIAGNOSIS — J111 Influenza due to unidentified influenza virus with other respiratory manifestations: Secondary | ICD-10-CM

## 2023-09-11 DIAGNOSIS — J101 Influenza due to other identified influenza virus with other respiratory manifestations: Secondary | ICD-10-CM | POA: Insufficient documentation

## 2023-09-11 DIAGNOSIS — Z20822 Contact with and (suspected) exposure to covid-19: Secondary | ICD-10-CM | POA: Insufficient documentation

## 2023-09-11 LAB — BASIC METABOLIC PANEL
Anion gap: 11 (ref 5–15)
BUN: 12 mg/dL (ref 6–20)
CO2: 22 mmol/L (ref 22–32)
Calcium: 9 mg/dL (ref 8.9–10.3)
Chloride: 106 mmol/L (ref 98–111)
Creatinine, Ser: 0.85 mg/dL (ref 0.44–1.00)
GFR, Estimated: >60 mL/min (ref >60)
Glucose, Bld: 109 mg/dL — ABNORMAL HIGH (ref 70–99)
Potassium: 3.3 mmol/L — ABNORMAL LOW (ref 3.5–5.1)
Sodium: 139 mmol/L (ref 135–145)

## 2023-09-11 LAB — HCG, QUANTITATIVE, PREGNANCY: hCG, Beta Chain, Quant, S: <1 m[IU]/mL (ref <5)

## 2023-09-11 LAB — RESP PANEL BY RT-PCR (RSV, FLU A&B, COVID)  RVPGX2
Influenza A by PCR: POSITIVE — AB
Influenza B by PCR: NEGATIVE
Resp Syncytial Virus by PCR: NEGATIVE
SARS Coronavirus 2 by RT PCR: NEGATIVE

## 2023-09-11 LAB — TROPONIN I (HIGH SENSITIVITY): Troponin I (High Sensitivity): 4 ng/L (ref <18)

## 2023-09-11 LAB — D-DIMER, QUANTITATIVE: D-Dimer, Quant: 0.92 ug{FEU}/mL — ABNORMAL HIGH (ref 0.00–0.50)

## 2023-09-11 MED ORDER — FAMOTIDINE IN NACL 20-0.9 MG/50ML-% IV SOLN
20.0000 mg | Freq: Once | INTRAVENOUS | Status: AC
  Administered 2023-09-11: 20 mg via INTRAVENOUS
  Filled 2023-09-11: qty 50

## 2023-09-11 MED ORDER — DIPHENHYDRAMINE HCL 25 MG PO CAPS
50.0000 mg | ORAL_CAPSULE | Freq: Once | ORAL | Status: AC
  Administered 2023-09-11: 50 mg via ORAL
  Filled 2023-09-11: qty 2

## 2023-09-11 MED ORDER — LIDOCAINE VISCOUS HCL 2 % MT SOLN
15.0000 mL | Freq: Once | OROMUCOSAL | Status: AC
  Administered 2023-09-11: 15 mL via OROMUCOSAL
  Filled 2023-09-11: qty 15

## 2023-09-11 MED ORDER — IBUPROFEN 800 MG PO TABS
800.0000 mg | ORAL_TABLET | Freq: Once | ORAL | Status: AC
  Administered 2023-09-11: 800 mg via ORAL
  Filled 2023-09-11: qty 1

## 2023-09-11 MED ORDER — PREDNISONE 20 MG PO TABS
60.0000 mg | ORAL_TABLET | Freq: Once | ORAL | Status: AC
  Administered 2023-09-11: 60 mg via ORAL
  Filled 2023-09-11: qty 3

## 2023-09-11 MED ORDER — ALUM & MAG HYDROXIDE-SIMETH 200-200-20 MG/5ML PO SUSP
30.0000 mL | Freq: Once | ORAL | Status: AC
  Administered 2023-09-11: 30 mL via ORAL
  Filled 2023-09-11: qty 30

## 2023-09-11 MED ORDER — IOHEXOL 350 MG/ML SOLN
75.0000 mL | Freq: Once | INTRAVENOUS | Status: AC | PRN
  Administered 2023-09-11: 75 mL via INTRAVENOUS

## 2023-09-11 MED ORDER — PREDNISONE 50 MG PO TABS
50.0000 mg | ORAL_TABLET | Freq: Four times a day (QID) | ORAL | Status: DC
  Administered 2023-09-11: 50 mg via ORAL
  Filled 2023-09-11: qty 1

## 2023-09-11 MED ORDER — IPRATROPIUM-ALBUTEROL 0.5-2.5 (3) MG/3ML IN SOLN
3.0000 mL | Freq: Once | RESPIRATORY_TRACT | Status: AC
  Administered 2023-09-11: 3 mL via RESPIRATORY_TRACT
  Filled 2023-09-11: qty 3

## 2023-09-11 NOTE — ED Provider Notes (Addendum)
Patient care taken over at shift change.  She has been having cough, headaches, and chills since yesterday.  She is positive for flu here in the ED.  D-dimer was ordered due to shortness of breath and tachycardia, which came back positive.  Disposition pending CTA results. Physical Exam  BP (!) 156/92   Pulse (!) 117   Temp 99.9 F (37.7 C) (Oral)   Resp (!) 22   LMP 08/14/2023 (Within Days)   SpO2 100%   Physical Exam Vitals and nursing note reviewed.  Constitutional:      Appearance: Normal appearance. She is ill-appearing.  HENT:     Head: Normocephalic and atraumatic.     Mouth/Throat:     Mouth: Mucous membranes are moist.     Pharynx: Oropharynx is clear.     Comments: No angioedema or swelling of the oropharynx Eyes:     Conjunctiva/sclera: Conjunctivae normal.     Pupils: Pupils are equal, round, and reactive to light.  Cardiovascular:     Rate and Rhythm: Normal rate and regular rhythm.     Heart sounds: Normal heart sounds.  Pulmonary:     Effort: Pulmonary effort is normal.     Breath sounds: Normal breath sounds.  Skin:    General: Skin is warm and dry.     Findings: No rash.  Neurological:     General: No focal deficit present.     Mental Status: She is alert.  Psychiatric:        Mood and Affect: Mood normal.        Behavior: Behavior normal.     Procedures  Procedures  ED Course / MDM   Clinical Course as of 09/11/23 1507  Wed Sep 11, 2023  1338 hCG, quantitative, pregnancy [JR]    Clinical Course User Index [JR] Gareth Eagle, PA-C   Medical Decision Making Amount and/or Complexity of Data Reviewed Labs: ordered. Decision-making details documented in ED Course. Radiology: ordered.  Risk OTC drugs. Prescription drug management.   Patient felt short of breath when laying on the table for CT. Dr. Silverio Lay ordered patient a GI cocktail to help with symptoms since her airway looked clear on evaluation.  CTA was negative for pulmonary embolism.   Signed with patient.  She send she is feeling more or less the same during the GI cocktail.  Airway was clear on reevaluation. Shortness of breath experienced at CT was most likely due to her viral illness and not a result of contrast dye. Patient safe to receive contrast in the future if premedicated prior.  She is stable and safe for discharge home.  Return precautions provided.   Maxwell Marion, PA-C 09/11/23 1939    Charlynne Pander, MD 09/11/23 2230    Maxwell Marion, PA-C 09/12/23 [id number]    Charlynne Pander, MD 09/13/23 [phone]

## 2023-09-11 NOTE — ED Triage Notes (Signed)
Pt arrived via POV. C/o cough, headache, and chills since yesterday.  AOx4

## 2023-09-11 NOTE — ED Provider Notes (Signed)
[place] EMERGENCY DEPARTMENT AT [facility] Provider Note   CSN: [id number] Arrival date & time: 09/11/23  1231     History  Chief Complaint  Patient presents with   URI   HPI Lorraine [name] is a 37 y.o. female with history of asthma presenting for cough headache and chills. Started yesterday. Also endorses subjective fever.  States she is also somewhat short of breath with chest pain. Chest pain is generalized all over the chest and is worse with coughing. The chest pain is intermittent and feels sharp.  Denies calf tenderness.   URI      Home Medications Prior to Admission medications   Medication Sig Start Date End Date Taking? Authorizing Provider  albuterol (VENTOLIN HFA) 108 (90 Base) MCG/ACT inhaler Inhale 2 puffs into the lungs every 4 (four) hours as needed for wheezing or shortness of breath. 08/25/22   Bethann Berkshire, MD  dicyclomine (BENTYL) 20 MG tablet Take 1 tablet (20 mg total) by mouth 2 (two) times daily. 08/20/23   Rexford Maus, DO  metoCLOPramide (REGLAN) 10 MG tablet Take 1 tablet (10 mg total) by mouth every 6 (six) hours. 08/19/23   Arletha Pili, DO  nitrofurantoin, macrocrystal-monohydrate, (MACROBID) 100 MG capsule Take 1 capsule (100 mg total) by mouth 2 (two) times daily. Patient not taking: Reported on 08/02/2023 06/03/23   Royanne Foots, DO  omeprazole (PRILOSEC) 20 MG capsule Take 1 capsule (20 mg total) by mouth daily. 08/19/23 09/18/23  Anders Simmonds T, DO  ondansetron (ZOFRAN) 4 MG tablet Take 1 tablet (4 mg total) by mouth every 6 (six) hours. Patient not taking: Reported on 08/02/2023 06/03/23   Estelle June A, DO  ondansetron (ZOFRAN-ODT) 4 MG disintegrating tablet Take 1 tablet (4 mg total) by mouth every 8 (eight) hours as needed for nausea or vomiting. Patient not taking: Reported on 05/26/2023 03/30/23   Henderly, Britni A, PA-C  ondansetron (ZOFRAN-ODT) 4 MG disintegrating tablet Take 1 tablet (4 mg total) by  mouth every 8 (eight) hours as needed for nausea or vomiting. Patient not taking: Reported on 08/02/2023 05/26/23   Mannie Stabile, PA-C  pantoprazole (PROTONIX) 20 MG tablet Take 1 tablet (20 mg total) by mouth daily for 14 days. 07/25/23 08/08/23  Coral Spikes, DO  sucralfate (CARAFATE) 1 GM/10ML suspension Take 10 mLs (1 g total) by mouth 4 (four) times daily -  with meals and at bedtime. 08/02/23   Nira Conn, MD  cetirizine (ZYRTEC ALLERGY) 10 MG tablet Take 1 tablet (10 mg total) by mouth 2 (two) times daily. Patient not taking: Reported on 12/06/2020 11/12/20 01/24/21  Muthersbaugh, Dahlia Client, PA-C      Allergies    Iodinated contrast media, Lupine bean extract, Ceftriaxone, and Morphine    Review of Systems   See HPI  Physical Exam Updated Vital Signs BP (!) 156/92   Pulse (!) 117   Temp 99.9 F (37.7 C) (Oral)   Resp (!) 22   LMP 08/14/2023 (Within Days)   SpO2 100%  Physical Exam Vitals and nursing note reviewed.  HENT:     Head: Normocephalic and atraumatic.     Mouth/Throat:     Mouth: Mucous membranes are moist.  Eyes:     General:        Right eye: No discharge.        Left eye: No discharge.     Conjunctiva/sclera: Conjunctivae normal.  Cardiovascular:     Rate and Rhythm: Normal  rate and regular rhythm.     Pulses: Normal pulses.     Heart sounds: Normal heart sounds.  Pulmonary:     Effort: Pulmonary effort is normal.     Breath sounds: Normal breath sounds.  Abdominal:     General: Abdomen is flat.     Palpations: Abdomen is soft.  Skin:    General: Skin is warm and dry.  Neurological:     General: No focal deficit present.  Psychiatric:        Mood and Affect: Mood normal.     ED Results / Procedures / Treatments   Labs (all labs ordered are listed, but only abnormal results are displayed) Labs Reviewed  RESP PANEL BY RT-PCR (RSV, FLU A&B, COVID)  RVPGX2 - Abnormal; Notable for the following components:      Result Value    Influenza A by PCR POSITIVE (*)    All other components within normal limits  BASIC METABOLIC PANEL - Abnormal; Notable for the following components:   Potassium 3.3 (*)    Glucose, Bld 109 (*)    All other components within normal limits  D-DIMER, QUANTITATIVE - Abnormal; Notable for the following components:   D-Dimer, Quant 0.92 (*)    All other components within normal limits  HCG, QUANTITATIVE, PREGNANCY  TROPONIN I (HIGH SENSITIVITY)  TROPONIN I (HIGH SENSITIVITY)    EKG None  Radiology DG Chest 2 View Result Date: 09/11/2023 CLINICAL DATA:  Cough and chills beginning yesterday. EXAM: CHEST - 2 VIEW COMPARISON:  09/03/2023 FINDINGS: The heart size and mediastinal contours are within normal limits. Both lungs are clear. The visualized skeletal structures are unremarkable. IMPRESSION: Normal exam. Electronically Signed   By: Danae Orleans M.D.   On: 09/11/2023 14:37    Procedures Procedures    Medications Ordered in ED Medications  predniSONE (DELTASONE) tablet 50 mg (50 mg Oral Given 09/11/23 1504)  diphenhydrAMINE (BENADRYL) capsule 50 mg (has no administration in time range)  ipratropium-albuterol (DUONEB) 0.5-2.5 (3) MG/3ML nebulizer solution 3 mL (3 mLs Nebulization Given 09/11/23 1313)  predniSONE (DELTASONE) tablet 60 mg (60 mg Oral Given 09/11/23 1313)  ibuprofen (ADVIL) tablet 800 mg (800 mg Oral Given 09/11/23 1313)  diphenhydrAMINE (BENADRYL) capsule 50 mg (50 mg Oral Given 09/11/23 1504)    ED Course/ Medical Decision Making/ A&P Clinical Course as of 09/11/23 1532  Wed Sep 11, 2023  1338 hCG, quantitative, pregnancy [JR]    Clinical Course User Index [JR] Gareth Eagle, PA-C                                 Medical Decision Making  37 year old well-appearing female presenting for URI symptoms chest pain and shortness of breath.  Exam notable for tachycardia but otherwise reassuring.  DDx includes ACS, PE, pneumonia, sepsis, URI, other.  Labs reveal  that she is flu positive and has an elevated D-dimer.  I suspect that her symptoms are likely related to the flu but given her tachycardia, shortness of breath and chest pain and elevated D-dimer cannot definitively rule out PE.  Chest CTA is pending.  Plan will be to follow-up on the chest CTA and reassess.  If remaining workup is reassuring likely discharge with conservative management at home for flu.  Signed out patient to PA Petra Kuba.        Final Clinical Impression(s) / ED Diagnoses Final diagnoses:  Influenza    Rx /  DC Orders ED Discharge Orders     None         Vaughan Browner 09/11/23 1532    Lorre Nick, MD 09/12/23 [phone]

## 2023-09-11 NOTE — Discharge Instructions (Addendum)
Como se mencion, lo ms probable es que sus sntomas estn relacionados con la gripe. Alterne entre ibuprofeno y Tylenol cada cuatro horas segn sea necesario para la fiebre, los dolores de [place] y los dolores corporales. Tambin puede tomar Robitussin de venta libre para la tos. Asegrese de beber mucho lquido. Realice un seguimiento con su mdico de cabecera en siete das para reevaluar sus sntomas.   Solicite ayuda de inmediato si: Le falta el aire. Tiene dificultad para respirar. La piel o las uas se ponen de un color azulado. Presenta dolor muy intenso o rigidez en el cuello. Tiene dolor de cabeza repentino. Le duele la cara o el odo de forma repentina. No puede comer ni beber sin vomitar.

## 2023-09-13 ENCOUNTER — Encounter (HOSPITAL_COMMUNITY): Payer: Self-pay

## 2023-09-13 ENCOUNTER — Emergency Department (HOSPITAL_COMMUNITY)
Admission: EM | Admit: 2023-09-13 | Discharge: 2023-09-13 | Disposition: A | Discharge status: Home / Self Care | Payer: Self-pay

## 2023-09-13 ENCOUNTER — Other Ambulatory Visit: Payer: Self-pay

## 2023-09-13 DIAGNOSIS — J45909 Unspecified asthma, uncomplicated: Secondary | ICD-10-CM | POA: Insufficient documentation

## 2023-09-13 DIAGNOSIS — J101 Influenza due to other identified influenza virus with other respiratory manifestations: Secondary | ICD-10-CM | POA: Insufficient documentation

## 2023-09-13 DIAGNOSIS — R051 Acute cough: Secondary | ICD-10-CM | POA: Insufficient documentation

## 2023-09-13 MED ORDER — ACETAMINOPHEN 500 MG PO TABS: 500.0000 mg | ORAL_TABLET | Freq: Four times a day (QID) | ORAL | 0 refills | Status: DC | PRN

## 2023-09-13 MED ORDER — BENZONATATE 100 MG PO CAPS: 100.0000 mg | ORAL_CAPSULE | Freq: Three times a day (TID) | ORAL | 0 refills | Status: DC

## 2023-09-13 MED ORDER — BENZONATATE 100 MG PO CAPS
100.0000 mg | ORAL_CAPSULE | Freq: Once | ORAL | Status: AC
  Administered 2023-09-13: 100 mg via ORAL
  Filled 2023-09-13: qty 1

## 2023-09-13 NOTE — ED Provider Notes (Signed)
[place] EMERGENCY DEPARTMENT AT [facility] Provider Note   CSN: [id number] Arrival date & time: 09/13/23  2151     History  Chief Complaint  Patient presents with   Cough    Lorraine [name] is a 37 y.o. female.  The history is provided by the patient and medical records. No language interpreter was used.  Cough    37 year old female significant history of asthma, recently diagnosed with influenza A several days ago presenting with complaints of chest pain.  Patient states since her diagnosis she has been coughing quite a bit and endorsing pain with cough.  Symptoms persist.  She is not coughing up any blood.  She denies having fever or chills no nausea vomiting diarrhea.  No worsening shortness of breath.  Home Medications Prior to Admission medications   Medication Sig Start Date End Date Taking? Authorizing Provider  albuterol (VENTOLIN HFA) 108 (90 Base) MCG/ACT inhaler Inhale 2 puffs into the lungs every 4 (four) hours as needed for wheezing or shortness of breath. 08/25/22   Bethann Berkshire, MD  dicyclomine (BENTYL) 20 MG tablet Take 1 tablet (20 mg total) by mouth 2 (two) times daily. 08/20/23   Rexford Maus, DO  metoCLOPramide (REGLAN) 10 MG tablet Take 1 tablet (10 mg total) by mouth every 6 (six) hours. 08/19/23   Arletha Pili, DO  nitrofurantoin, macrocrystal-monohydrate, (MACROBID) 100 MG capsule Take 1 capsule (100 mg total) by mouth 2 (two) times daily. Patient not taking: Reported on 08/02/2023 06/03/23   Royanne Foots, DO  omeprazole (PRILOSEC) 20 MG capsule Take 1 capsule (20 mg total) by mouth daily. 08/19/23 09/18/23  Anders Simmonds T, DO  ondansetron (ZOFRAN) 4 MG tablet Take 1 tablet (4 mg total) by mouth every 6 (six) hours. Patient not taking: Reported on 08/02/2023 06/03/23   Estelle June A, DO  ondansetron (ZOFRAN-ODT) 4 MG disintegrating tablet Take 1 tablet (4 mg total) by mouth every 8 (eight) hours as needed for nausea or  vomiting. Patient not taking: Reported on 05/26/2023 03/30/23   Henderly, Britni A, PA-C  ondansetron (ZOFRAN-ODT) 4 MG disintegrating tablet Take 1 tablet (4 mg total) by mouth every 8 (eight) hours as needed for nausea or vomiting. Patient not taking: Reported on 08/02/2023 05/26/23   Mannie Stabile, PA-C  pantoprazole (PROTONIX) 20 MG tablet Take 1 tablet (20 mg total) by mouth daily for 14 days. 07/25/23 08/08/23  Coral Spikes, DO  sucralfate (CARAFATE) 1 GM/10ML suspension Take 10 mLs (1 g total) by mouth 4 (four) times daily -  with meals and at bedtime. 08/02/23   Nira Conn, MD  cetirizine (ZYRTEC ALLERGY) 10 MG tablet Take 1 tablet (10 mg total) by mouth 2 (two) times daily. Patient not taking: Reported on 12/06/2020 11/12/20 01/24/21  Muthersbaugh, Dahlia Client, PA-C      Allergies    Iodinated contrast media, Lupine bean extract, Ceftriaxone, and Morphine    Review of Systems   Review of Systems  Respiratory:  Positive for cough.   All other systems reviewed and are negative.   Physical Exam Updated Vital Signs BP 127/78 (BP Location: Right Arm)   Pulse (!) 113   Temp 98.5 F (36.9 C) (Oral)   Resp 20   LMP 08/21/2023 (Within Days)   SpO2 100%  Physical Exam Vitals and nursing note reviewed.  Constitutional:      General: She is not in acute distress.    Appearance: She is well-developed.  Comments: Patient actively coughing while in the room.  HENT:     Head: Atraumatic.  Eyes:     Conjunctiva/sclera: Conjunctivae normal.  Cardiovascular:     Rate and Rhythm: Tachycardia present.     Pulses: Normal pulses.     Heart sounds: Normal heart sounds.  Pulmonary:     Effort: Pulmonary effort is normal.     Breath sounds: No wheezing, rhonchi or rales.  Abdominal:     Palpations: Abdomen is soft.     Tenderness: There is no abdominal tenderness.  Musculoskeletal:     Cervical back: Neck supple.     Right lower leg: No edema.     Left lower leg: No edema.   Skin:    Findings: No rash.  Neurological:     Mental Status: She is alert.  Psychiatric:        Mood and Affect: Mood normal.     ED Results / Procedures / Treatments   Labs (all labs ordered are listed, but only abnormal results are displayed) Labs Reviewed - No data to display  EKG None  Radiology No results found.  Procedures Procedures    Medications Ordered in ED Medications  benzonatate (TESSALON) capsule 100 mg (has no administration in time range)    ED Course/ Medical Decision Making/ A&P                                 Medical Decision Making  BP 127/78 (BP Location: Right Arm)   Pulse (!) 113   Temp 98.5 F (36.9 C) (Oral)   Resp 20   LMP 08/21/2023 (Within Days)   SpO2 100%   28:36 PM   37 year old female significant history of asthma, recently diagnosed with influenza A several days ago presenting with complaints of chest pain.  Patient states since her diagnosis she has been coughing quite a bit and endorsing pain with cough.  Symptoms persist.  She is not coughing up any blood.  She denies having fever or chills no nausea vomiting diarrhea.  No worsening shortness of breath.  Exam notable for active coughing while in the room however lungs are clear no active wheezing no rales or rhonchi.  She is mildly tachycardic.  Vitals are notable for mild tachycardia with heart rate of 113.  Patient is afebrile no hypoxia.  EMR reviewed patient has been seen twice within the past week for the same complaint.  She was recently diagnosed positive for influenza A.  During her last visit yesterday a chest CT angiogram was performed showing no evidence of pneumonia and no evidence of PE.  I felt her symptoms likely due to costochondritis secondary to persistent coughing.  I do not think patient will benefit from additional repeat CT scan of the chest or additional lab work.  I will provide patient with supportive care which includes cough medication.  She does  not appear to have an asthma exacerbation at this time.  Patient is otherwise stable for discharge.  Doubt PE, PTX, pneumonia, pleural effusion.  Patient is outside the window to treat with Tamiflu.  Hospital admission considered however patient is not hypoxic and she is afebrile.        Final Clinical Impression(s) / ED Diagnoses Final diagnoses:  None    Rx / DC Orders ED Discharge Orders     None         Fayrene Helper, PA-C 09/13/23 2220  Durwin Glaze, MD 09/13/23 2256

## 2023-09-13 NOTE — Discharge Instructions (Addendum)
You have been evaluated for your symptoms.  Your cough and chest discomfort is likely due to influenza A.  You may take medication prescribed as needed for your symptoms.  Your recent CT scan of your chest did not show any pneumonia or blood clot in your lungs.  Usually a viral infection such as yours may last for 1 to 2 weeks.  Return if you have any concern.  Le han evaluado sus sntomas.  Es probable que su tos y Dentist en el pecho se deban a la influenza A. Puede tomar los medicamentos recetados segn sea necesario para sus sntomas.  Su reciente tomografa computarizada de su trax no mostr ninguna neumona ni cogulos de sangre en sus pulmones.  Por lo general, una infeccin viral como la suya puede durar de 1 a 2 semanas.  Regrese si tiene alguna inquietud.

## 2023-09-13 NOTE — ED Triage Notes (Signed)
Pt to ED by POV from home with c/o a dry cough which began 2 days ago. Pt was seen 2 days ago for the same. Pt arrives A+O, VSS, NADN.

## 2023-09-17 ENCOUNTER — Emergency Department (HOSPITAL_COMMUNITY)
Admission: EM | Admit: 2023-09-17 | Discharge: 2023-09-17 | Disposition: A | Discharge status: Home / Self Care | Payer: Self-pay | Attending: Emergency Medicine | Admitting: Emergency Medicine

## 2023-09-17 ENCOUNTER — Other Ambulatory Visit: Payer: Self-pay

## 2023-09-17 ENCOUNTER — Encounter (HOSPITAL_COMMUNITY): Payer: Self-pay

## 2023-09-17 DIAGNOSIS — J101 Influenza due to other identified influenza virus with other respiratory manifestations: Secondary | ICD-10-CM | POA: Insufficient documentation

## 2023-09-17 DIAGNOSIS — R1011 Right upper quadrant pain: Secondary | ICD-10-CM | POA: Insufficient documentation

## 2023-09-17 DIAGNOSIS — Z20822 Contact with and (suspected) exposure to covid-19: Secondary | ICD-10-CM | POA: Insufficient documentation

## 2023-09-17 DIAGNOSIS — E119 Type 2 diabetes mellitus without complications: Secondary | ICD-10-CM | POA: Insufficient documentation

## 2023-09-17 LAB — CBC WITH DIFFERENTIAL/PLATELET
Abs Immature Granulocytes: 0.01 10*3/uL (ref 0.00–0.07)
Basophils Absolute: 0 10*3/uL (ref 0.0–0.1)
Basophils Relative: 0 %
Eosinophils Absolute: 0.1 10*3/uL (ref 0.0–0.5)
Eosinophils Relative: 1 %
HCT: 36.9 % (ref 36.0–46.0)
Hemoglobin: 12 g/dL (ref 12.0–15.0)
Immature Granulocytes: 0 %
Lymphocytes Relative: 65 %
Lymphs Abs: 4.7 10*3/uL — ABNORMAL HIGH (ref 0.7–4.0)
MCH: 29.3 pg (ref 26.0–34.0)
MCHC: 32.5 g/dL (ref 30.0–36.0)
MCV: 90.2 fL (ref 80.0–100.0)
Monocytes Absolute: 0.3 10*3/uL (ref 0.1–1.0)
Monocytes Relative: 5 %
Neutro Abs: 2.1 10*3/uL (ref 1.7–7.7)
Neutrophils Relative %: 29 %
Platelets: 254 10*3/uL (ref 150–400)
RBC: 4.09 MIL/uL (ref 3.87–5.11)
RDW: 12.2 % (ref 11.5–15.5)
WBC: 7.2 10*3/uL (ref 4.0–10.5)
nRBC: 0 % (ref 0.0–0.2)

## 2023-09-17 LAB — URINALYSIS, ROUTINE W REFLEX MICROSCOPIC
Bacteria, UA: NONE SEEN
Bilirubin Urine: NEGATIVE
Glucose, UA: NEGATIVE mg/dL
Ketones, ur: NEGATIVE mg/dL
Leukocytes,Ua: NEGATIVE
Nitrite: NEGATIVE
Protein, ur: NEGATIVE mg/dL
Specific Gravity, Urine: 1.02 (ref 1.005–1.030)
pH: 6 (ref 5.0–8.0)

## 2023-09-17 LAB — COMPREHENSIVE METABOLIC PANEL
ALT: 23 U/L (ref 0–44)
AST: 24 U/L (ref 15–41)
Albumin: 3.6 g/dL (ref 3.5–5.0)
Alkaline Phosphatase: 85 U/L (ref 38–126)
Anion gap: 7 (ref 5–15)
BUN: 14 mg/dL (ref 6–20)
CO2: 24 mmol/L (ref 22–32)
Calcium: 8.6 mg/dL — ABNORMAL LOW (ref 8.9–10.3)
Chloride: 107 mmol/L (ref 98–111)
Creatinine, Ser: 0.68 mg/dL (ref 0.44–1.00)
GFR, Estimated: >60 mL/min (ref >60)
Glucose, Bld: 102 mg/dL — ABNORMAL HIGH (ref 70–99)
Potassium: 3.3 mmol/L — ABNORMAL LOW (ref 3.5–5.1)
Sodium: 138 mmol/L (ref 135–145)
Total Bilirubin: 0.3 mg/dL (ref 0.0–1.2)
Total Protein: 7 g/dL (ref 6.5–8.1)

## 2023-09-17 LAB — RESP PANEL BY RT-PCR (RSV, FLU A&B, COVID)  RVPGX2
Influenza A by PCR: POSITIVE — AB
Influenza B by PCR: NEGATIVE
Resp Syncytial Virus by PCR: NEGATIVE
SARS Coronavirus 2 by RT PCR: NEGATIVE

## 2023-09-17 LAB — PREGNANCY, URINE: Preg Test, Ur: NEGATIVE

## 2023-09-17 MED ORDER — ONDANSETRON 4 MG PO TBDP
4.0000 mg | ORAL_TABLET | Freq: Three times a day (TID) | ORAL | 0 refills | Status: DC | PRN
Start: 2023-09-17 — End: 2023-09-19

## 2023-09-17 MED ORDER — ONDANSETRON 4 MG PO TBDP
4.0000 mg | ORAL_TABLET | Freq: Once | ORAL | Status: AC
  Administered 2023-09-17: 4 mg via ORAL
  Filled 2023-09-17: qty 1

## 2023-09-17 MED ORDER — ACETAMINOPHEN 500 MG PO TABS
1000.0000 mg | ORAL_TABLET | Freq: Once | ORAL | Status: AC
  Administered 2023-09-17: 1000 mg via ORAL
  Filled 2023-09-17: qty 2

## 2023-09-17 MED ORDER — OSELTAMIVIR PHOSPHATE 75 MG PO CAPS: 75.0000 mg | ORAL_CAPSULE | Freq: Two times a day (BID) | ORAL | 0 refills | Status: DC

## 2023-09-17 NOTE — ED Provider Notes (Signed)
 [place] EMERGENCY DEPARTMENT AT [facility] Provider Note   CSN: [id number] Arrival date & time: 09/17/23  0101     History  Chief Complaint  Patient presents with   Dysuria    Lorraine [name] is a 37 y.o. female with history of diverticulitis, diabetes, presents with concern for generalized bodyaches, nausea and vomiting, and headache for the past 2 days.  States she has been having some chills, but unknown if she has been having fevers at home.  States her symptoms came on very suddenly.  Denies any diarrhea, shortness of breath, cough.  Last menstrual period ended 2 days ago.   Dysuria Associated symptoms: no fever        Home Medications Prior to Admission medications   Medication Sig Start Date End Date Taking? Authorizing Provider  ondansetron  (ZOFRAN -ODT) 4 MG disintegrating tablet Take 1 tablet (4 mg total) by mouth every 8 (eight) hours as needed for nausea or vomiting. 09/17/23  Yes Veta Palma, PA-C  oseltamivir  (TAMIFLU ) 75 MG capsule Take 1 capsule (75 mg total) by mouth every 12 (twelve) hours. 09/17/23  Yes Veta Palma, PA-C  acetaminophen  (TYLENOL ) 500 MG tablet Take 1 tablet (500 mg total) by mouth every 6 (six) hours as needed. 09/13/23   Nivia Colon, PA-C  albuterol  (VENTOLIN  HFA) 108 (90 Base) MCG/ACT inhaler Inhale 2 puffs into the lungs every 4 (four) hours as needed for wheezing or shortness of breath. 08/25/22   Suzette Pac, MD  benzonatate  (TESSALON ) 100 MG capsule Take 1 capsule (100 mg total) by mouth every 8 (eight) hours. 09/13/23   Nivia Colon, PA-C  dicyclomine  (BENTYL ) 20 MG tablet Take 1 tablet (20 mg total) by mouth 2 (two) times daily. 08/20/23   Kingsley, Victoria K, DO  metoCLOPramide  (REGLAN ) 10 MG tablet Take 1 tablet (10 mg total) by mouth every 6 (six) hours. 08/19/23   Mannie Pac DASEN, DO  omeprazole  (PRILOSEC) 20 MG capsule Take 1 capsule (20 mg total) by mouth daily. 08/19/23 09/18/23  Mannie Pac T, DO   pantoprazole  (PROTONIX ) 20 MG tablet Take 1 tablet (20 mg total) by mouth daily for 14 days. 07/25/23 08/08/23  Neysa Caron PARAS, DO  sucralfate  (CARAFATE ) 1 GM/10ML suspension Take 10 mLs (1 g total) by mouth 4 (four) times daily -  with meals and at bedtime. 08/02/23   Trine Raynell Moder, MD  cetirizine  (ZYRTEC  ALLERGY) 10 MG tablet Take 1 tablet (10 mg total) by mouth 2 (two) times daily. Patient not taking: Reported on 12/06/2020 11/12/20 01/24/21  Muthersbaugh, Chiquita, PA-C      Allergies    Iodinated contrast media, Lupine bean extract, Ceftriaxone , and Morphine     Review of Systems   Review of Systems  Constitutional:  Positive for chills. Negative for fever.  Respiratory:  Negative for shortness of breath.   Genitourinary:  Positive for dysuria.    Physical Exam Updated Vital Signs BP 108/68 (BP Location: Left Arm)   Pulse 76   Temp 97.8 F (36.6 C) (Oral)   Resp 18   Ht 5' (1.524 m)   Wt 77.1 kg   LMP 09/13/2023 (Within Days)   SpO2 98%   BMI 33.20 kg/m  Physical Exam Vitals and nursing note reviewed.  Constitutional:      General: She is not in acute distress.    Appearance: She is well-developed.     Comments: Curled up sleeping when I enter the room, no active vomiting.  Appears tired  HENT:  Head: Normocephalic and atraumatic.  Eyes:     Conjunctiva/sclera: Conjunctivae normal.  Cardiovascular:     Rate and Rhythm: Normal rate and regular rhythm.     Heart sounds: No murmur heard. Pulmonary:     Effort: Pulmonary effort is normal. No respiratory distress.     Breath sounds: Normal breath sounds.  Abdominal:     Palpations: Abdomen is soft.     Tenderness: There is no abdominal tenderness.     Comments: Mildly tender to palpation in the right upper quadrant, no rebound or guarding  Musculoskeletal:        General: No swelling.     Cervical back: Neck supple.  Skin:    General: Skin is warm and dry.     Capillary Refill: Capillary refill takes less  than 2 seconds.  Neurological:     Mental Status: She is alert.  Psychiatric:        Mood and Affect: Mood normal.     ED Results / Procedures / Treatments   Labs (all labs ordered are listed, but only abnormal results are displayed) Labs Reviewed  RESP PANEL BY RT-PCR (RSV, FLU A&B, COVID)  RVPGX2 - Abnormal; Notable for the following components:      Result Value   Influenza A by PCR POSITIVE (*)    All other components within normal limits  CBC WITH DIFFERENTIAL/PLATELET - Abnormal; Notable for the following components:   Lymphs Abs 4.7 (*)    All other components within normal limits  URINALYSIS, ROUTINE W REFLEX MICROSCOPIC - Abnormal; Notable for the following components:   Hgb urine dipstick SMALL (*)    All other components within normal limits  COMPREHENSIVE METABOLIC PANEL - Abnormal; Notable for the following components:   Potassium 3.3 (*)    Glucose, Bld 102 (*)    Calcium  8.6 (*)    All other components within normal limits  PREGNANCY, URINE    EKG None  Radiology No results found.  Procedures Procedures    Medications Ordered in ED Medications  ondansetron  (ZOFRAN -ODT) disintegrating tablet 4 mg (4 mg Oral Given 09/17/23 0906)  acetaminophen  (TYLENOL ) tablet 1,000 mg (1,000 mg Oral Given 09/17/23 0906)    ED Course/ Medical Decision Making/ A&P                                 Medical Decision Making Risk OTC drugs. Prescription drug management.     Differential diagnosis includes but is not limited to COVID, flu, RSV, viral URI, strep pharyngitis, viral pharyngitis, allergic rhinitis, pneumonia, bronchitis  ED Course:  Patient does appear tired and curled up upon my initial evaluation.  Her vital signs are stable.  Patient tested positive for influenza A.  Negative for COVID and RSV.  CBC unremarkable, CMP without any elevation in LFTs, normal creatinine.  Hypokalemia at 3.3.  Urinalysis unremarkable, no nitrites, leukocytes, white blood  cells, no concern for UTI at this time.  Lipase within normal limits.  Low concern for any acute abdominal pathology.  Feel her symptoms are due to her flu. Patient given Tylenol  for headache, Zofran  for nausea  Patient stable and appropriate for discharge home this time.  Impression: Influenza A  Disposition:  The patient was discharged home with instructions to take Tamiflu  as prescribed.  Over-the-counter medications as needed. Return precautions given.           Final Clinical Impression(s) / ED Diagnoses  Final diagnoses:  Influenza A    Rx / DC Orders ED Discharge Orders          Ordered    oseltamivir  (TAMIFLU ) 75 MG capsule  Every 12 hours        09/17/23 1141    ondansetron  (ZOFRAN -ODT) 4 MG disintegrating tablet  Every 8 hours PRN        09/17/23 1141              Geneveive, [place], PA-C 09/17/23 1155    Levander Houston, MD 09/23/23 [phone]

## 2023-09-17 NOTE — ED Triage Notes (Signed)
 Pt POV d/t dysuria with lower back, some emesis and headache for 2 days.  Pt took Tylenol 2 hours ago.

## 2023-09-17 NOTE — Discharge Instructions (Addendum)
 You tested positive for the flu today.  Your COVID, RSV are negative.  Medications prescribed:   You have been prescribed Tamiflu  to help decrease the duration of your symptoms.  This is an antiviral medication for the flu.  Please take this as prescribed.  You also have been prescribed Zofran  to help with nausea.  Please take this as prescribed.  Home care instructions:  You can take Tylenol  and/or Ibuprofen  as directed on the packaging for fever reduction and pain relief.    For cough: honey 1/2 to 1 teaspoon (you can dilute the honey in water or another fluid).  You can also use guaifenesin  and dextromethorphan for cough which are over-the-counter medications. You can use a humidifier for chest congestion and cough.  If you don't have a humidifier, you can sit in the bathroom with the hot shower running.      For sore throat: try warm salt water gargles, cepacol lozenges, throat spray, warm tea or water with lemon/honey, popsicles or ice, or OTC cold relief medicine for throat discomfort.    For congestion: Flonase  1-2 sprays in each nostril daily.    It is important to stay hydrated: drink plenty of fluids (water, gatorade/powerade/pedialyte, juices, or teas) to keep your throat moisturized and help further relieve irritation/discomfort.   Your illness is contagious and can be spread to others, especially during the first 3 or 4 days. It cannot be cured by antibiotics or other medicines. Take basic precautions such as wearing a mask, washing your hands often, covering your mouth when you cough or sneeze, and avoiding public places where you could spread your illness to others.   You may return to normal activities (work/school) when:  - You are having improvement in symptoms  - AND have had resolution of fever without the use of fever-reducing medications for 24 hours  Follow-up instructions: Please follow-up with your primary care provider for further evaluation of your symptoms if you  are not feeling better within the next 5 days.  Return instructions:  Please return to the Emergency Department if you experience worsening symptoms.  RETURN IMMEDIATELY IF you develop shortness of breath, confusion or altered mental status, a new rash, become dizzy, faint, or poorly responsive, or are unable to be cared for at home. Please return if you have persistent vomiting and cannot keep down fluids or develop a fever that is not controlled by tylenol  or motrin .   Please return if you have any other emergent concerns.

## 2023-09-17 NOTE — ED Provider Triage Note (Signed)
 Emergency Medicine Provider Triage Evaluation Note  Phylis Javed , a 37 y.o. female  was evaluated in triage.  Pt complains of generalized bodyaches, nausea and vomiting, and headache for 2 days.  Also reports subjective chills at home but no fever.  Feels like she has some discomfort when urinating.  She got off of her menstrual period 2 days ago.  Review of Systems  Positive: As above Negative: As above  Physical Exam  BP 116/83 (BP Location: Left Arm)   Pulse 81   Temp 97.9 F (36.6 C)   Resp 18   Ht 5' (1.524 m)   Wt 77.1 kg   LMP 09/13/2023 (Within Days)   SpO2 100%   BMI 33.20 kg/m  Gen:   Awake, no distress   Resp:  Normal effort  MSK:   Moves extremities without difficulty  Other:  Mild TTP of the RUQ  Medical Decision Making  Medically screening exam initiated at 8:46 AM.  Appropriate orders placed.  Truc Perez-Matiano was informed that the remainder of the evaluation will be completed by another provider, this initial triage assessment does not replace that evaluation, and the importance of remaining in the ED until their evaluation is complete.     Veta Palma, PA-C 09/17/23 [phone]

## 2023-09-19 ENCOUNTER — Other Ambulatory Visit: Payer: Self-pay

## 2023-09-19 ENCOUNTER — Emergency Department (HOSPITAL_BASED_OUTPATIENT_CLINIC_OR_DEPARTMENT_OTHER)
Admission: EM | Admit: 2023-09-19 | Discharge: 2023-09-19 | Disposition: A | Discharge status: Home / Self Care | Payer: Self-pay | Attending: Emergency Medicine | Admitting: Emergency Medicine

## 2023-09-19 DIAGNOSIS — M545 Low back pain, unspecified: Secondary | ICD-10-CM | POA: Insufficient documentation

## 2023-09-19 DIAGNOSIS — R112 Nausea with vomiting, unspecified: Secondary | ICD-10-CM | POA: Insufficient documentation

## 2023-09-19 DIAGNOSIS — R300 Dysuria: Secondary | ICD-10-CM | POA: Insufficient documentation

## 2023-09-19 DIAGNOSIS — R051 Acute cough: Secondary | ICD-10-CM | POA: Insufficient documentation

## 2023-09-19 DIAGNOSIS — R519 Headache, unspecified: Secondary | ICD-10-CM | POA: Insufficient documentation

## 2023-09-19 DIAGNOSIS — R42 Dizziness and giddiness: Secondary | ICD-10-CM | POA: Insufficient documentation

## 2023-09-19 LAB — COMPREHENSIVE METABOLIC PANEL
ALT: 15 U/L (ref 0–44)
AST: 17 U/L (ref 15–41)
Albumin: 4.2 g/dL (ref 3.5–5.0)
Alkaline Phosphatase: 82 U/L (ref 38–126)
Anion gap: 9 (ref 5–15)
BUN: 13 mg/dL (ref 6–20)
CO2: 22 mmol/L (ref 22–32)
Calcium: 8.7 mg/dL — ABNORMAL LOW (ref 8.9–10.3)
Chloride: 106 mmol/L (ref 98–111)
Creatinine, Ser: 0.7 mg/dL (ref 0.44–1.00)
GFR, Estimated: >60 mL/min (ref >60)
Glucose, Bld: 106 mg/dL — ABNORMAL HIGH (ref 70–99)
Potassium: 3.5 mmol/L (ref 3.5–5.1)
Sodium: 137 mmol/L (ref 135–145)
Total Bilirubin: 0.3 mg/dL (ref 0.0–1.2)
Total Protein: 7.5 g/dL (ref 6.5–8.1)

## 2023-09-19 LAB — CBC
HCT: 37.8 % (ref 36.0–46.0)
Hemoglobin: 12.7 g/dL (ref 12.0–15.0)
MCH: 30 pg (ref 26.0–34.0)
MCHC: 33.6 g/dL (ref 30.0–36.0)
MCV: 89.2 fL (ref 80.0–100.0)
Platelets: 242 10*3/uL (ref 150–400)
RBC: 4.24 MIL/uL (ref 3.87–5.11)
RDW: 12.2 % (ref 11.5–15.5)
WBC: 6.6 10*3/uL (ref 4.0–10.5)
nRBC: 0 % (ref 0.0–0.2)

## 2023-09-19 LAB — URINALYSIS, ROUTINE W REFLEX MICROSCOPIC
Bacteria, UA: NONE SEEN
Bilirubin Urine: NEGATIVE
Glucose, UA: NEGATIVE mg/dL
Hgb urine dipstick: NEGATIVE
Ketones, ur: NEGATIVE mg/dL
Leukocytes,Ua: NEGATIVE
Nitrite: NEGATIVE
Protein, ur: 30 mg/dL — AB
Specific Gravity, Urine: 1.03 (ref 1.005–1.030)
pH: 6.5 (ref 5.0–8.0)

## 2023-09-19 LAB — LIPASE, BLOOD: Lipase: 15 U/L (ref 11–51)

## 2023-09-19 LAB — PREGNANCY, URINE: Preg Test, Ur: NEGATIVE

## 2023-09-19 LAB — CBG MONITORING, ED: Glucose-Capillary: 102 mg/dL — ABNORMAL HIGH (ref 70–99)

## 2023-09-19 MED ORDER — METOCLOPRAMIDE HCL 10 MG PO TABS
10.0000 mg | ORAL_TABLET | Freq: Once | ORAL | Status: AC
Start: 2023-09-19 — End: 2023-09-19
  Administered 2023-09-19: 10 mg via ORAL
  Filled 2023-09-19: qty 1

## 2023-09-19 MED ORDER — KETOROLAC TROMETHAMINE 30 MG/ML IJ SOLN
30.0000 mg | Freq: Once | INTRAMUSCULAR | Status: AC
  Administered 2023-09-19: 30 mg via INTRAMUSCULAR
  Filled 2023-09-19: qty 1

## 2023-09-19 MED ORDER — MECLIZINE HCL 25 MG PO TABS
25.0000 mg | ORAL_TABLET | Freq: Three times a day (TID) | ORAL | 0 refills | Status: DC | PRN
Start: 2023-09-19 — End: 2024-06-05

## 2023-09-19 MED ORDER — DIPHENHYDRAMINE HCL 25 MG PO CAPS
25.0000 mg | ORAL_CAPSULE | Freq: Once | ORAL | Status: AC
Start: 2023-09-19 — End: 2023-09-19
  Administered 2023-09-19: 25 mg via ORAL
  Filled 2023-09-19: qty 1

## 2023-09-19 MED ORDER — ONDANSETRON 4 MG PO TBDP
4.0000 mg | ORAL_TABLET | Freq: Once | ORAL | Status: AC
  Administered 2023-09-19: 4 mg via ORAL
  Filled 2023-09-19: qty 1

## 2023-09-19 MED ORDER — KETOROLAC TROMETHAMINE 10 MG PO TABS: 10.0000 mg | ORAL_TABLET | Freq: Four times a day (QID) | ORAL | 0 refills | Status: DC | PRN

## 2023-09-19 MED ORDER — ONDANSETRON 4 MG PO TBDP: 4.0000 mg | ORAL_TABLET | Freq: Three times a day (TID) | ORAL | 0 refills | Status: DC | PRN

## 2023-09-19 MED ORDER — PROMETHAZINE HCL 25 MG RE SUPP: 25.0000 mg | Freq: Four times a day (QID) | RECTAL | 0 refills | Status: DC | PRN

## 2023-09-19 MED ORDER — ONDANSETRON HCL 4 MG/2ML IJ SOLN: 4.0000 mg | Freq: Once | INTRAMUSCULAR | Status: DC

## 2023-09-19 MED ORDER — DROPERIDOL 2.5 MG/ML IJ SOLN
1.2500 mg | Freq: Once | INTRAMUSCULAR | Status: AC
  Administered 2023-09-19: 1.25 mg via INTRAVENOUS
  Filled 2023-09-19: qty 2

## 2023-09-19 MED ORDER — MECLIZINE HCL 25 MG PO TABS
25.0000 mg | ORAL_TABLET | Freq: Once | ORAL | Status: AC
  Administered 2023-09-19: 25 mg via ORAL
  Filled 2023-09-19: qty 1

## 2023-09-19 NOTE — ED Triage Notes (Signed)
 Spanish interpreter used.  1 week HA. Dizziness and vomiting.  Endorses dysuria, and right flank pain. Reports blood when wiping after urination cannot tell if vaginal. LMP last week.

## 2023-09-19 NOTE — ED Notes (Signed)
 ED Provider at bedside.

## 2023-09-19 NOTE — ED Provider Notes (Signed)
 [facility] AT [facility] Provider Note   CSN: [id number] Arrival date & time: 09/19/23  1522     History  Chief Complaint  Patient presents with   Dizziness    Lorraine [name] is a 38 y.o. female.   Dizziness  38 year old female presents emergency department with a myriad of complaints.  Patient has been seen now 12 times in the emergency department the past 2 months.  States that she has been with intermittent headache/lightheadedness for the past 4 days.  States that she has been taking Tylenol  at home with some improvement of symptoms.  Headache in forehead area without radiation.  Denies any visual disturbance, gait abnormality, slurred speech, facial droop, weakness/sensory deficits in upper lower extremities.  Reports vomiting as well as feelings of lightheadedness.  These feelings of lightheadedness do not necessarily correlated with exertion but usually right before she vomits and relieved thereafter.  Reports some tolerance of the quids.  Was seen on 12/31 for coughing as well as some chest discomfort and tested positive for influenza A.  States that she did not pick up her antiemetic at that time.  Patient also reporting some dysuria as well as some left-sided flank pain.  Patient states that she noticed a little bit of blood in her urine yesterday but is unaware of whether or not it was from vaginal bleeding or from a urinary source.  Denies any fever, chills, chest pain, shortness of breath.  States that she is still with cough as well as some nasal congestion.    Somewhat difficulty obtaining history via tele interpreter used as story seems somewhat incongruent.  Will obtain labs, treat symptoms and reassess.   Past medical history significant for pyelonephritis, nephrolithiasis, chronic abdominal pain, acute pancreatitis, GERD  Home Medications Prior to Admission medications   Medication Sig Start Date End Date Taking? Authorizing Provider   ketorolac  (TORADOL ) 10 MG tablet Take 1 tablet (10 mg total) by mouth every 6 (six) hours as needed. 09/19/23  Yes Silver Fell A, PA  meclizine  (ANTIVERT ) 25 MG tablet Take 1 tablet (25 mg total) by mouth 3 (three) times daily as needed for dizziness. 09/19/23  Yes Silver Fell A, PA  ondansetron  (ZOFRAN -ODT) 4 MG disintegrating tablet Take 1 tablet (4 mg total) by mouth every 8 (eight) hours as needed. 09/19/23  Yes Silver Fell A, PA  promethazine  (PHENERGAN ) 25 MG suppository Place 1 suppository (25 mg total) rectally every 6 (six) hours as needed for nausea or vomiting. 09/19/23  Yes Silver Fell A, PA  acetaminophen  (TYLENOL ) 500 MG tablet Take 1 tablet (500 mg total) by mouth every 6 (six) hours as needed. 09/13/23   Nivia Colon, PA-C  albuterol  (VENTOLIN  HFA) 108 (90 Base) MCG/ACT inhaler Inhale 2 puffs into the lungs every 4 (four) hours as needed for wheezing or shortness of breath. 08/25/22   Suzette Pac, MD  benzonatate  (TESSALON ) 100 MG capsule Take 1 capsule (100 mg total) by mouth every 8 (eight) hours. 09/13/23   Nivia Colon, PA-C  dicyclomine  (BENTYL ) 20 MG tablet Take 1 tablet (20 mg total) by mouth 2 (two) times daily. 08/20/23   Kingsley, Victoria K, DO  metoCLOPramide  (REGLAN ) 10 MG tablet Take 1 tablet (10 mg total) by mouth every 6 (six) hours. 08/19/23   Mannie Pac DASEN, DO  omeprazole  (PRILOSEC) 20 MG capsule Take 1 capsule (20 mg total) by mouth daily. 08/19/23 09/18/23  Mannie Pac T, DO  oseltamivir  (TAMIFLU ) 75 MG capsule Take 1  capsule (75 mg total) by mouth every 12 (twelve) hours. 09/17/23   Veta Palma, PA-C  pantoprazole  (PROTONIX ) 20 MG tablet Take 1 tablet (20 mg total) by mouth daily for 14 days. 07/25/23 08/08/23  Neysa Caron PARAS, DO  sucralfate  (CARAFATE ) 1 GM/10ML suspension Take 10 mLs (1 g total) by mouth 4 (four) times daily -  with meals and at bedtime. 08/02/23   Trine Raynell Moder, MD  cetirizine  (ZYRTEC  ALLERGY) 10 MG tablet Take 1 tablet  (10 mg total) by mouth 2 (two) times daily. Patient not taking: Reported on 12/06/2020 11/12/20 01/24/21  Muthersbaugh, Chiquita, PA-C      Allergies    Iodinated contrast media, Lupine bean extract, Ceftriaxone , and Morphine     Review of Systems   Review of Systems  Neurological:  Positive for dizziness.  All other systems reviewed and are negative.   Physical Exam Updated Vital Signs BP 110/81   Pulse (!) 57   Temp 98.2 F (36.8 C) (Oral)   Resp 16   LMP 09/13/2023 (Within Days)   SpO2 97%  Physical Exam Vitals and nursing note reviewed.  Constitutional:      General: She is not in acute distress.    Appearance: She is well-developed.  HENT:     Head: Normocephalic and atraumatic.     Right Ear: Tympanic membrane normal.     Left Ear: Tympanic membrane normal.     Nose: Congestion and rhinorrhea present.  Eyes:     Conjunctiva/sclera: Conjunctivae normal.  Cardiovascular:     Rate and Rhythm: Normal rate and regular rhythm.     Heart sounds: No murmur heard. Pulmonary:     Effort: Pulmonary effort is normal. No respiratory distress.     Breath sounds: Normal breath sounds. No wheezing, rhonchi or rales.  Abdominal:     Palpations: Abdomen is soft.     Tenderness: There is no abdominal tenderness.     Comments: Paraspinal tenderness in the left lumbar region versus left CVA tenderness.  Musculoskeletal:        General: No swelling.     Cervical back: Neck supple.     Right lower leg: No edema.     Left lower leg: No edema.  Skin:    General: Skin is warm and dry.     Capillary Refill: Capillary refill takes less than 2 seconds.  Neurological:     Mental Status: She is alert.     Comments: Alert and oriented to self, place, time and event.   Speech is fluent, clear without dysarthria or dysphasia.   Strength 5/5 in upper/lower extremities   Sensation intact in upper/lower extremities   Normal gait.  No pronator drift.  Normal finger-to-nose and feet tapping.   CN I not tested  CN II not tested CN III, IV, VI PERRLA and EOMs intact bilaterally. Rightward beating horizontal nystagmus on EOMs. CN V Intact sensation to sharp and light touch to the face  CN VII facial movements symmetric  CN VIII not tested  CN IX, X no uvula deviation, symmetric rise of soft palate  CN XI 5/5 SCM and trapezius strength bilaterally  CN XII Midline tongue protrusion, symmetric L/R movements       Psychiatric:        Mood and Affect: Mood normal.     ED Results / Procedures / Treatments   Labs (all labs ordered are listed, but only abnormal results are displayed) Labs Reviewed  COMPREHENSIVE METABOLIC PANEL - Abnormal;  Notable for the following components:      Result Value   Glucose, Bld 106 (*)    Calcium  8.7 (*)    All other components within normal limits  URINALYSIS, ROUTINE W REFLEX MICROSCOPIC - Abnormal; Notable for the following components:   APPearance HAZY (*)    Protein, ur 30 (*)    All other components within normal limits  CBG MONITORING, ED - Abnormal; Notable for the following components:   Glucose-Capillary 102 (*)    All other components within normal limits  URINE CULTURE  LIPASE, BLOOD  CBC  PREGNANCY, URINE    EKG None  Radiology No results found.  Procedures Procedures    Medications Ordered in ED Medications  ondansetron  (ZOFRAN -ODT) disintegrating tablet 4 mg (4 mg Oral Given 09/19/23 1609)  ketorolac  (TORADOL ) 30 MG/ML injection 30 mg (30 mg Intramuscular Given 09/19/23 2056)  metoCLOPramide  (REGLAN ) tablet 10 mg (10 mg Oral Given 09/19/23 2055)  diphenhydrAMINE  (BENADRYL ) capsule 25 mg (25 mg Oral Given 09/19/23 2055)  droperidol  (INAPSINE ) 2.5 MG/ML injection 1.25 mg (1.25 mg Intravenous Given 09/19/23 2158)  meclizine  (ANTIVERT ) tablet 25 mg (25 mg Oral Given 09/19/23 2232)    ED Course/ Medical Decision Making/ A&P Clinical Course as of 09/20/23 0026  Thu Sep 19, 2023  2145 Patient began to vomit during p.o.  challenge.  Will trial additional antiemetic and reassess thereafter. [CR]  2230 Reevaluation the patient showed improvement of symptoms after droperidol  administered.  Describing now with dizziness which was not aforementioned but was mentioned to triage staff.  States that she was having lightheaded originally.  Now describing room spinning dizziness.  Suspect there could be translator error along the way as this was not relayed as room spinning dizziness upon initial HPI.  Patient reports 3 days of room spinning dizziness worsened with positional changes and relieved with rest.  Says she has not had the symptoms before.  Denies any double vision, difficulty speaking/swallowing.  States that when she has the symptoms she has difficulty walking but when she is not feeling room spinning dizziness, she is able to ambulate independently.  Rightward beating horizontal nystagmus appreciated on EOMs.  Will trial meclizine  for treatment of patient's dizziness and reassess thereafter. [CR]    Clinical Course User Index [CR] Silver Wonda LABOR, PA                                 Medical Decision Making Amount and/or Complexity of Data Reviewed Labs: ordered.  Risk Prescription drug management.   This patient presents to the ED for concern of headache, left flank pain, cough, congestion, this involves an extensive number of treatment options, and is a complaint that carries with it a high risk of complications and morbidity.  The differential diagnosis includes gastritis, PUD, cholecystitis, pyelonephritis, nephrolithiasis, exposures will be open volvulus, diverticulitis, appendicitis, CVA, migraine/tension/cluster headache, meningitis/encephalitis, CVA, cerebral venous thrombosis, coronary/vertebral artery dissection, tumor, other   Co morbidities that complicate the patient evaluation  See HPI   Additional history obtained:  Additional history obtained from EMR External records from outside source  obtained and reviewed including hospital records   Lab Tests:  I Ordered, and personally interpreted labs.  The pertinent results include: No leukocytosis.  No evidence of anemia.  Platelets within range.  Hypocalcemia of 8.7 but otherwise, electrolytes within normal limits.  No transaminitis.  No renal dysfunction.  UA with 30 proteins but otherwise  unremarkable.  Urine pregnancy negative.  Lipase within normal limits.  Urine culture pending.   Imaging Studies ordered:  N/a   Cardiac Monitoring: / EKG:  The patient was maintained on a cardiac monitor.  I personally viewed and interpreted the cardiac monitored which showed an underlying rhythm of: sinus rhythm   Consultations Obtained:  N/a   Problem List / ED Course / Critical interventions / Medication management  Dizziness, left back pain, cough, congestion I ordered medication including Toradol , Reglan , Benadryl , Zofran , droperidol , meclizine  Reevaluation of the patient after these medicines showed that the patient improved I have reviewed the patients home medicines and have made adjustments as needed   Social Determinants of Health:  Denies tobacco, licit drug use   Test / Admission - Considered:  Dizziness, left back pain, cough, congestion Vitals signs significant for hypertension blood pressure 145/79. Otherwise within normal range and stable throughout visit. Laboratory studies significant for: See above 38 year old female presents emergency department with multiple different complaints.  Has been seen 12 times in the emergency department over the past 2 months.   Initially with first interpreter, patient symptoms were described as lightheadedness without room spinning sensation and more so feelings of faintness.  Second interpreter was used upon reevaluation as patient was still feeling nauseous with more description of room spinning type sensation worsened with positional changes as described in ED course and  subsequently below. Regarding dizziness, described as room spinning type sensation and worsened with positional changes of the head.  Nonfocal neurologic exam including cerebellar testing.  CT imaging of the head was not obtained given most likely peripheral nature of vertigo.  Treated with meclizine  as well as antiemetic and noted resolution of symptoms.  Will recommend continue use of meclizine  as needed in the outpatient setting and follow-up with ENT.   Regarding vomiting, closely correlated with feeling of room spinning dizziness.  Labs reassuring.  Abdomen nontender.  Further imaging of patient's abdomen deemed unnecessary. Regarding cough and congestion, patient tested positive for influenza A on 12/1.  Lungs clear to auscultation bilaterally.  No leukocytosis. Has had 3 separate images of her chest in the past month for similar symptoms; given clear nature of lung sounds, chest x-ray imaging was foregone.  Suspect likely secondary to influenza A.   Regarding left low back pain, UA without evidence of infection or RBCs; low suspicion for pyelonephritis/nephrolithiasis.  No red flag signs for back pain on HPI/PE; low suspicion for cauda equina/spinal epidural abscess/spinal cord compression/impingement.  Symptoms most likely secondary to muscular tenderness paraspinally left lumbar region.  Recommend NSAIDs for treatment of pain and follow-up with primary care.   Treatment plan discussed at length with patient and she acknowledged understanding was agreeable to said plan.  Patient overall well-appearing, afebrile in no acute distress. Worrisome signs and symptoms were discussed with the patient, and the patient acknowledged understanding to return to the ED if noticed. Patient was stable upon discharge.          Final Clinical Impression(s) / ED Diagnoses Final diagnoses:  Dizziness  Acute cough  Acute left-sided low back pain without sciatica  Nausea and vomiting, unspecified vomiting type     Rx / DC Orders      Silver Wonda LABOR, PA 09/20/23 9973    Jerral Meth, MD 09/20/23 [phone]

## 2023-09-19 NOTE — ED Notes (Signed)
 Spanish interpretor used to review discharge instructions, medications, and home care with pt. Pt verbalized understanding and had no further questions. Pt exited ED without complications.

## 2023-09-19 NOTE — Discharge Instructions (Addendum)
 As discussed, your workup today was overall reassuring.  Your urine did not look infected or did have gross amount of blood in the urine.  Does not seem like you have a kidney infection or a kidney stone as cause of your left back pain.  I suspect this is more muscular in nature.  Regarding headache, you been treated with medications in the emergency department.  Will send you home with the same medicines to use for headache as well as nausea medicine.  Regarding room spinning dizziness, suspect that you have peripheral cause of vertigo.  Will send in medicines called meclizine  to use as needed.  Recommend follow-up with the ENT specialist in the outpatient setting for further assessment.  I have attached the number to discharge papers as well as center referral.  Please do not hesitate to return if the worrisome signs and symptoms we discussed become apparent.

## 2023-09-19 NOTE — ED Notes (Addendum)
 Pt started feeling nauseas and dry heaving after taking sips of water with meds. No vomiting. PA notified.

## 2023-09-19 NOTE — ED Notes (Signed)
 Pt given water and saltine crackers for PO challenge. Pt able to tolerate well.

## 2023-09-19 NOTE — ED Notes (Signed)
 Emesis x1, Cooper PA made aware, meds ordered

## 2023-09-21 LAB — URINE CULTURE

## 2023-09-22 ENCOUNTER — Encounter (HOSPITAL_BASED_OUTPATIENT_CLINIC_OR_DEPARTMENT_OTHER): Payer: Self-pay | Admitting: Emergency Medicine

## 2023-09-22 ENCOUNTER — Emergency Department (HOSPITAL_BASED_OUTPATIENT_CLINIC_OR_DEPARTMENT_OTHER): Payer: Self-pay

## 2023-09-22 ENCOUNTER — Emergency Department (HOSPITAL_BASED_OUTPATIENT_CLINIC_OR_DEPARTMENT_OTHER)
Admission: EM | Admit: 2023-09-22 | Discharge: 2023-09-22 | Disposition: A | Discharge status: Home / Self Care | Payer: Self-pay | Attending: Emergency Medicine | Admitting: Emergency Medicine

## 2023-09-22 ENCOUNTER — Other Ambulatory Visit: Payer: Self-pay

## 2023-09-22 DIAGNOSIS — Z20822 Contact with and (suspected) exposure to covid-19: Secondary | ICD-10-CM | POA: Insufficient documentation

## 2023-09-22 DIAGNOSIS — R0602 Shortness of breath: Secondary | ICD-10-CM | POA: Insufficient documentation

## 2023-09-22 LAB — CBC WITH DIFFERENTIAL/PLATELET
Abs Immature Granulocytes: 0.04 10*3/uL (ref 0.00–0.07)
Basophils Absolute: 0 10*3/uL (ref 0.0–0.1)
Basophils Relative: 0 %
Eosinophils Absolute: 0 10*3/uL (ref 0.0–0.5)
Eosinophils Relative: 0 %
HCT: 37.6 % (ref 36.0–46.0)
Hemoglobin: 12.4 g/dL (ref 12.0–15.0)
Immature Granulocytes: 0 %
Lymphocytes Relative: 39 %
Lymphs Abs: 3.4 10*3/uL (ref 0.7–4.0)
MCH: 29.6 pg (ref 26.0–34.0)
MCHC: 33 g/dL (ref 30.0–36.0)
MCV: 89.7 fL (ref 80.0–100.0)
Monocytes Absolute: 0.7 10*3/uL (ref 0.1–1.0)
Monocytes Relative: 7 %
Neutro Abs: 4.7 10*3/uL (ref 1.7–7.7)
Neutrophils Relative %: 54 %
Platelets: 278 10*3/uL (ref 150–400)
RBC: 4.19 MIL/uL (ref 3.87–5.11)
RDW: 12.5 % (ref 11.5–15.5)
WBC: 8.9 10*3/uL (ref 4.0–10.5)
nRBC: 0 % (ref 0.0–0.2)

## 2023-09-22 LAB — BASIC METABOLIC PANEL
Anion gap: 9 (ref 5–15)
BUN: 20 mg/dL (ref 6–20)
CO2: 22 mmol/L (ref 22–32)
Calcium: 8.9 mg/dL (ref 8.9–10.3)
Chloride: 108 mmol/L (ref 98–111)
Creatinine, Ser: 0.84 mg/dL (ref 0.44–1.00)
GFR, Estimated: >60 mL/min (ref >60)
Glucose, Bld: 108 mg/dL — ABNORMAL HIGH (ref 70–99)
Potassium: 3.5 mmol/L (ref 3.5–5.1)
Sodium: 139 mmol/L (ref 135–145)

## 2023-09-22 LAB — I-STAT VENOUS BLOOD GAS, ED
Acid-Base Excess: 1 mmol/L (ref 0.0–2.0)
Bicarbonate: 25.4 mmol/L (ref 20.0–28.0)
Calcium, Ion: 1.1 mmol/L — ABNORMAL LOW (ref 1.15–1.40)
HCT: 37 % (ref 36.0–46.0)
Hemoglobin: 12.6 g/dL (ref 12.0–15.0)
O2 Saturation: 90 %
Potassium: 4.8 mmol/L (ref 3.5–5.1)
Sodium: 140 mmol/L (ref 135–145)
TCO2: 27 mmol/L (ref 22–32)
pCO2, Ven: 38 mm[Hg] — ABNORMAL LOW (ref 44–60)
pH, Ven: 7.434 — ABNORMAL HIGH (ref 7.25–7.43)
pO2, Ven: 56 mm[Hg] — ABNORMAL HIGH (ref 32–45)

## 2023-09-22 LAB — RESP PANEL BY RT-PCR (RSV, FLU A&B, COVID)  RVPGX2
Influenza A by PCR: NEGATIVE
Influenza B by PCR: NEGATIVE
Resp Syncytial Virus by PCR: NEGATIVE
SARS Coronavirus 2 by RT PCR: NEGATIVE

## 2023-09-22 MED ORDER — KETOROLAC TROMETHAMINE 15 MG/ML IJ SOLN
15.0000 mg | Freq: Once | INTRAMUSCULAR | Status: AC
  Administered 2023-09-22: 15 mg via INTRAVENOUS
  Filled 2023-09-22: qty 1

## 2023-09-22 MED ORDER — ALBUTEROL SULFATE (2.5 MG/3ML) 0.083% IN NEBU
INHALATION_SOLUTION | RESPIRATORY_TRACT | Status: AC
  Filled 2023-09-22: qty 6

## 2023-09-22 MED ORDER — IPRATROPIUM BROMIDE 0.02 % IN SOLN
0.5000 mg | Freq: Once | RESPIRATORY_TRACT | Status: AC
  Administered 2023-09-22: 0.5 mg via RESPIRATORY_TRACT
  Filled 2023-09-22: qty 2.5

## 2023-09-22 MED ORDER — ALBUTEROL SULFATE HFA 108 (90 BASE) MCG/ACT IN AERS
2.0000 | INHALATION_SPRAY | Freq: Four times a day (QID) | RESPIRATORY_TRACT | Status: DC | PRN
  Administered 2023-09-22: 2 via RESPIRATORY_TRACT
  Filled 2023-09-22: qty 6.7

## 2023-09-22 MED ORDER — ALBUTEROL SULFATE (2.5 MG/3ML) 0.083% IN NEBU
5.0000 mg | INHALATION_SOLUTION | Freq: Once | RESPIRATORY_TRACT | Status: AC
  Administered 2023-09-22: 5 mg via RESPIRATORY_TRACT

## 2023-09-22 MED ORDER — ACETAMINOPHEN 500 MG PO TABS
1000.0000 mg | ORAL_TABLET | Freq: Once | ORAL | Status: AC
  Administered 2023-09-22: 1000 mg via ORAL
  Filled 2023-09-22: qty 2

## 2023-09-22 MED ORDER — AEROCHAMBER PLUS FLO-VU SMALL MISC
1.0000 | Freq: Once | Status: AC
  Administered 2023-09-22: 1
  Filled 2023-09-22: qty 1

## 2023-09-22 NOTE — ED Notes (Signed)
 RT Note: Patient was having continuous coughing in triage. Patient could not speak full sentences. An Albuterol  5mg  and Atrovent  0.5mg  was ordered for this patient. SPO2 94% RA on [facility] at [facility].   Patient currently SPO2 100% on RA with nebulizer treatment on face.

## 2023-09-22 NOTE — ED Notes (Signed)
 RT Note: Patient completed the breathing treatment and said she can breath easier.  Patient HR 122, RR 20, SPO2 100% RA. And has a decreased dry cough with no noted wheezes

## 2023-09-22 NOTE — ED Notes (Signed)
 RT Note: Patient requested an Albuterol inhaler with spacer to take home. She did not need education on the use just a refill

## 2023-09-22 NOTE — ED Triage Notes (Signed)
 Pt with SHOB and cough, asthma exacerbation since yesterday; last used inhaler 40 min PTA; RT in to assess pt

## 2023-09-22 NOTE — ED Provider Notes (Signed)
 [place] [facility] Provider Note   CSN: [id number] Arrival date & time: 09/22/23  1427     History Chief Complaint  Patient presents with   Shortness of Breath    HPI Lorraine [name] is a 38 y.o. female presenting for multiple complaints.  Seen recently, 16 visits in 6 months and.8 visits in the past month for a variety of complaints. Has been worked up for the flu, treated multiple times for asthma in the setting of flu, seen for nausea vomiting and treated.  Noted to be changing her chief complaint frequently during past visit.  Patient's recorded medical, surgical, social, medication list and allergies were reviewed in the Snapshot window as part of the initial history.   Review of Systems   Review of Systems  Constitutional:  Positive for chills, fatigue, fever and unexpected weight change.  HENT:  Positive for congestion and sore throat. Negative for ear pain.   Eyes:  Negative for pain and visual disturbance.  Respiratory:  Positive for cough and shortness of breath. Negative for choking.   Cardiovascular:  Positive for chest pain and leg swelling. Negative for palpitations.  Gastrointestinal:  Positive for abdominal distention, abdominal pain and nausea. Negative for vomiting.  Genitourinary:  Negative for dysuria and hematuria.  Musculoskeletal:  Negative for arthralgias and back pain.  Skin:  Negative for color change and rash.  Neurological:  Positive for weakness. Negative for seizures and syncope.  All other systems reviewed and are negative.   Physical Exam Updated Vital Signs BP 135/71   Pulse (!) 110   Temp 98.2 F (36.8 C) (Oral)   Resp (!) 29   Ht 5' (1.524 m)   Wt 77.1 kg   LMP 09/13/2023 (Within Days)   SpO2 97%   BMI 33.20 kg/m  Physical Exam Vitals and nursing note reviewed.  Constitutional:      General: She is not in acute distress.    Appearance: She is well-developed.  HENT:     Head: Normocephalic  and atraumatic.  Eyes:     Conjunctiva/sclera: Conjunctivae normal.  Cardiovascular:     Rate and Rhythm: Normal rate and regular rhythm.     Heart sounds: No murmur heard. Pulmonary:     Effort: Pulmonary effort is normal. No respiratory distress.     Breath sounds: Normal breath sounds.  Abdominal:     General: There is no distension.     Palpations: Abdomen is soft.     Tenderness: There is no abdominal tenderness. There is no right CVA tenderness or left CVA tenderness.  Musculoskeletal:        General: No swelling or tenderness. Normal range of motion.     Cervical back: Neck supple.  Skin:    General: Skin is warm and dry.  Neurological:     General: No focal deficit present.     Mental Status: She is alert and oriented to person, place, and time. Mental status is at baseline.     Cranial Nerves: No cranial nerve deficit.      ED Course/ Medical Decision Making/ A&P    Procedures Procedures   Medications Ordered in ED Medications  albuterol  (PROVENTIL ) (2.5 MG/3ML) 0.083% nebulizer solution (  Not Given 09/22/23 1450)  albuterol  (PROVENTIL ) (2.5 MG/3ML) 0.083% nebulizer solution 5 mg (5 mg Nebulization Given 09/22/23 1442)  ipratropium (ATROVENT ) nebulizer solution 0.5 mg (0.5 mg Nebulization Given 09/22/23 1450)  acetaminophen  (TYLENOL ) tablet 1,000 mg (1,000 mg Oral Given  09/22/23 1649)  ketorolac  (TORADOL ) 15 MG/ML injection 15 mg (15 mg Intravenous Given 09/22/23 1649)    Medical Decision Making:    Lorraine [name] is a 38 y.o. female who presented to the ED today with a myriad of complaints detailed above.     Patient placed on continuous vitals and telemetry monitoring while in ED which was reviewed periodically.   Complete initial physical exam performed, notably the patient  was HDS in NAD.      Reviewed and confirmed nursing documentation for past medical history, family history, social history.    Initial Assessment:   With the patient's presentation of  full body diffuse symptoms, most likely diagnosis is viral syndrome. Other diagnoses were considered including (but not limited to) ACS/PE, appendicitis, cholecystitis, small bowel obstruction, asthma exacerbation, pneumonia, pneumothorax, metabolic crisis, anemia. These are considered less likely due to history of present illness and physical exam findings.   This is most consistent with an acute life/limb threatening illness complicated by underlying chronic conditions.  Initial Plan:  VBG to evaluate for asthma exacerbation and CO2 retention Screening labs including CBC and Metabolic panel to evaluate for infectious or metabolic etiology of disease.  Urinalysis with reflex culture ordered to evaluate for UTI or relevant urologic/nephrologic pathology.  CXR to evaluate for structural/infectious intrathoracic pathology.  EKG to evaluate for cardiac pathology. Objective evaluation as below reviewed with plan for close reassessment  Initial Study Results:   Laboratory  All laboratory results reviewed without evidence of clinically relevant pathology.    EKG EKG was reviewed independently. Rate, rhythm, axis, intervals all examined and without medically relevant abnormality. ST segments without concerns for elevations.    Radiology  All images reviewed independently. Agree with radiology report at this time.   DG Chest Portable 1 View Result Date: 09/22/2023 CLINICAL DATA:  Shortness of breath and cough EXAM: PORTABLE CHEST 1 VIEW COMPARISON:  09/11/2023 FINDINGS: The heart size and mediastinal contours are within normal limits. Both lungs are clear. The visualized skeletal structures are unremarkable. IMPRESSION: No active disease. Electronically Signed   By: Luke Bun M.D.   On: 09/22/2023 16:05     Reassessment and Plan:   History of present illness physical exam findings are not consistent with any acute pathology at this time.  All results negative.  Patient ambulatory tolerating p.o.  intake no acute distress.  I have referred her [facility] the [facility] for outpatient care management given 16 emergency department visits in 6 months.  Informed her that she needs to obtain close follow-up with PCP within 72 hours for management of these chronic conditions.  No evidence of acute pathology today.   Disposition:  I have considered need for hospitalization, however, considering all of the above, I believe this patient is stable for discharge at this time.  Patient/family educated about specific return precautions for given chief complaint and symptoms.  Patient/family educated about follow-up with PCP.     Patient/family expressed understanding of return precautions and need for follow-up. Patient spoken to regarding all imaging and laboratory results and appropriate follow up for these results. All education provided in verbal form with additional information in written form. Time was allowed for answering of patient questions. Patient discharged.    Emergency Department Medication Summary:   Medications  albuterol  (PROVENTIL ) (2.5 MG/3ML) 0.083% nebulizer solution (  Not Given 09/22/23 1450)  albuterol  (PROVENTIL ) (2.5 MG/3ML) 0.083% nebulizer solution 5 mg (5 mg Nebulization Given 09/22/23 1442)  ipratropium (  ATROVENT ) nebulizer solution 0.5 mg (0.5 mg Nebulization Given 09/22/23 1450)  acetaminophen  (TYLENOL ) tablet 1,000 mg (1,000 mg Oral Given 09/22/23 1649)  ketorolac  (TORADOL ) 15 MG/ML injection 15 mg (15 mg Intravenous Given 09/22/23 1649)         Clinical Impression:  1. SOB (shortness of breath)      Discharge   Final Clinical Impression(s) / ED Diagnoses Final diagnoses:  SOB (shortness of breath)    Rx / DC Orders ED Discharge Orders     None         Jerral Meth, MD 09/22/23 [phone]

## 2023-09-22 NOTE — ED Notes (Signed)
 RT Note: VBG completed

## 2023-09-28 ENCOUNTER — Emergency Department
Admission: EM | Admit: 2023-09-28 | Discharge: 2023-09-28 | Discharge status: Against Medical Advice | Payer: Self-pay | Attending: Emergency Medicine | Admitting: Emergency Medicine

## 2023-09-28 ENCOUNTER — Other Ambulatory Visit: Payer: Self-pay

## 2023-09-28 ENCOUNTER — Emergency Department: Payer: Self-pay

## 2023-09-28 DIAGNOSIS — R55 Syncope and collapse: Secondary | ICD-10-CM | POA: Insufficient documentation

## 2023-09-28 DIAGNOSIS — R0602 Shortness of breath: Secondary | ICD-10-CM | POA: Insufficient documentation

## 2023-09-28 DIAGNOSIS — Z5321 Procedure and treatment not carried out due to patient leaving prior to being seen by health care provider: Secondary | ICD-10-CM | POA: Insufficient documentation

## 2023-09-28 DIAGNOSIS — R079 Chest pain, unspecified: Secondary | ICD-10-CM | POA: Insufficient documentation

## 2023-09-28 DIAGNOSIS — R059 Cough, unspecified: Secondary | ICD-10-CM | POA: Insufficient documentation

## 2023-09-28 LAB — CBC
HCT: 37.6 % (ref 36.0–46.0)
Hemoglobin: 12.5 g/dL (ref 12.0–15.0)
MCH: 30.3 pg (ref 26.0–34.0)
MCHC: 33.2 g/dL (ref 30.0–36.0)
MCV: 91 fL (ref 80.0–100.0)
Platelets: 252 10*3/uL (ref 150–400)
RBC: 4.13 MIL/uL (ref 3.87–5.11)
RDW: 12.5 % (ref 11.5–15.5)
WBC: 8.6 10*3/uL (ref 4.0–10.5)
nRBC: 0 % (ref 0.0–0.2)

## 2023-09-28 LAB — BASIC METABOLIC PANEL
Anion gap: 8 (ref 5–15)
BUN: 22 mg/dL — ABNORMAL HIGH (ref 6–20)
CO2: 22 mmol/L (ref 22–32)
Calcium: 8.7 mg/dL — ABNORMAL LOW (ref 8.9–10.3)
Chloride: 104 mmol/L (ref 98–111)
Creatinine, Ser: 0.65 mg/dL (ref 0.44–1.00)
GFR, Estimated: >60 mL/min (ref >60)
Glucose, Bld: 96 mg/dL (ref 70–99)
Potassium: 3.6 mmol/L (ref 3.5–5.1)
Sodium: 134 mmol/L — ABNORMAL LOW (ref 135–145)

## 2023-09-28 LAB — TROPONIN I (HIGH SENSITIVITY): Troponin I (High Sensitivity): 5 ng/L (ref <18)

## 2023-09-28 NOTE — ED Notes (Signed)
 No answer x 3 for repeat VS.

## 2023-09-28 NOTE — ED Triage Notes (Signed)
 Pt to ED via POV from home. Pt reports she was at work and started coughing a lot and started to have CP and blacked out and fell down 4 stairs. Pt reports SOB.

## 2023-10-19 ENCOUNTER — Emergency Department (HOSPITAL_BASED_OUTPATIENT_CLINIC_OR_DEPARTMENT_OTHER): Payer: Self-pay

## 2023-10-19 ENCOUNTER — Encounter (HOSPITAL_BASED_OUTPATIENT_CLINIC_OR_DEPARTMENT_OTHER): Payer: Self-pay | Admitting: Emergency Medicine

## 2023-10-19 ENCOUNTER — Emergency Department (HOSPITAL_COMMUNITY): Payer: Self-pay

## 2023-10-19 ENCOUNTER — Emergency Department (HOSPITAL_BASED_OUTPATIENT_CLINIC_OR_DEPARTMENT_OTHER)
Admission: EM | Admit: 2023-10-19 | Discharge: 2023-10-19 | Disposition: A | Discharge status: Home / Self Care | Payer: Self-pay

## 2023-10-19 ENCOUNTER — Other Ambulatory Visit: Payer: Self-pay

## 2023-10-19 DIAGNOSIS — X500XXA Overexertion from strenuous movement or load, initial encounter: Secondary | ICD-10-CM | POA: Insufficient documentation

## 2023-10-19 DIAGNOSIS — M899 Disorder of bone, unspecified: Secondary | ICD-10-CM

## 2023-10-19 DIAGNOSIS — M25531 Pain in right wrist: Secondary | ICD-10-CM | POA: Insufficient documentation

## 2023-10-19 DIAGNOSIS — Y990 Civilian activity done for income or pay: Secondary | ICD-10-CM | POA: Insufficient documentation

## 2023-10-19 DIAGNOSIS — J45909 Unspecified asthma, uncomplicated: Secondary | ICD-10-CM | POA: Insufficient documentation

## 2023-10-19 DIAGNOSIS — M79641 Pain in right hand: Secondary | ICD-10-CM | POA: Insufficient documentation

## 2023-10-19 MED ORDER — NAPROXEN 250 MG PO TABS
500.0000 mg | ORAL_TABLET | Freq: Once | ORAL | Status: AC
  Administered 2023-10-19: 500 mg via ORAL
  Filled 2023-10-19: qty 2

## 2023-10-19 MED ORDER — KETOROLAC TROMETHAMINE 15 MG/ML IJ SOLN
15.0000 mg | Freq: Once | INTRAMUSCULAR | Status: AC
  Administered 2023-10-19: 15 mg via INTRAVENOUS
  Filled 2023-10-19: qty 1

## 2023-10-19 MED ORDER — GADOBUTROL 1 MMOL/ML IV SOLN
7.0000 mL | Freq: Once | INTRAVENOUS | Status: AC | PRN
  Administered 2023-10-19: 7 mL via INTRAVENOUS

## 2023-10-19 MED ORDER — HYDROCODONE-ACETAMINOPHEN 5-325 MG PO TABS: 2.0000 | ORAL_TABLET | Freq: Four times a day (QID) | ORAL | 0 refills | Status: DC | PRN

## 2023-10-19 NOTE — ED Notes (Signed)
Called for room-in MRI

## 2023-10-19 NOTE — ED Notes (Signed)
Pt coming to room from MRI.

## 2023-10-19 NOTE — ED Provider Notes (Signed)
[place] [facility] Provider Note   CSN: [id number] Arrival date & time: 10/19/23  1310     History  Chief Complaint  Patient presents with   Wrist Pain    RIGHT    Lorraine [name] is a 38 y.o. female with past medical history of diverticulitis, asthma, pyelo-, pancreatitis, asthma, GERD presents to emergency department for evaluation of right wrist pain for past 2 days.  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  She states that there is no pain at rest but pain worsens with movement of wrist or palpation.  She has tried RICE and ibuprofen at home with minimal relief.   Wrist Pain Pertinent negatives include no chest pain, no abdominal pain, no headaches and no shortness of breath.     Home Medications Prior to Admission medications   Medication Sig Start Date End Date Taking? Authorizing Provider  acetaminophen (TYLENOL) 500 MG tablet Take 1 tablet (500 mg total) by mouth every 6 (six) hours as needed. 09/13/23   Fayrene Helper, PA-C  albuterol (VENTOLIN HFA) 108 (90 Base) MCG/ACT inhaler Inhale 2 puffs into the lungs every 4 (four) hours as needed for wheezing or shortness of breath. 08/25/22   Bethann Berkshire, MD  benzonatate (TESSALON) 100 MG capsule Take 1 capsule (100 mg total) by mouth every 8 (eight) hours. 09/13/23   Fayrene Helper, PA-C  dicyclomine (BENTYL) 20 MG tablet Take 1 tablet (20 mg total) by mouth 2 (two) times daily. 08/20/23   Elayne Snare K, DO  ketorolac (TORADOL) 10 MG tablet Take 1 tablet (10 mg total) by mouth every 6 (six) hours as needed. 09/19/23   Peter Garter, PA  meclizine (ANTIVERT) 25 MG tablet Take 1 tablet (25 mg total) by mouth 3 (three) times daily as needed for dizziness. 09/19/23   Peter Garter, PA  metoCLOPramide (REGLAN) 10 MG tablet Take 1 tablet (10 mg total) by mouth every 6 (six) hours. 08/19/23   Arletha Pili, DO   omeprazole (PRILOSEC) 20 MG capsule Take 1 capsule (20 mg total) by mouth daily. 08/19/23 09/18/23  Anders Simmonds T, DO  ondansetron (ZOFRAN-ODT) 4 MG disintegrating tablet Take 1 tablet (4 mg total) by mouth every 8 (eight) hours as needed. 09/19/23   Peter Garter, PA  oseltamivir (TAMIFLU) 75 MG capsule Take 1 capsule (75 mg total) by mouth every 12 (twelve) hours. 09/17/23   Arabella Merles, PA-C  pantoprazole (PROTONIX) 20 MG tablet Take 1 tablet (20 mg total) by mouth daily for 14 days. 07/25/23 08/08/23  Coral Spikes, DO  promethazine (PHENERGAN) 25 MG suppository Place 1 suppository (25 mg total) rectally every 6 (six) hours as needed for nausea or vomiting. 09/19/23   Peter Garter, PA  sucralfate (CARAFATE) 1 GM/10ML suspension Take 10 mLs (1 g total) by mouth 4 (four) times daily -  with meals and at bedtime. 08/02/23   Nira Conn, MD  cetirizine (ZYRTEC ALLERGY) 10 MG tablet Take 1 tablet (10 mg total) by mouth 2 (two) times daily. Patient not taking: Reported on 12/06/2020 11/12/20 01/24/21  Muthersbaugh, Dahlia Client, PA-C      Allergies    Iodinated contrast media, Lupine bean extract, Ceftriaxone, and Morphine    Review of Systems   Review of Systems  Constitutional:  Negative for chills, fatigue and fever.  Respiratory:  Negative for cough, chest tightness, shortness of breath  and wheezing.   Cardiovascular:  Negative for chest pain and palpitations.  Gastrointestinal:  Negative for abdominal pain, constipation, diarrhea, nausea and vomiting.  Musculoskeletal:  Positive for arthralgias.  Neurological:  Negative for dizziness, seizures, weakness, light-headedness, numbness and headaches.    Physical Exam Updated Vital Signs BP 119/79 (BP Location: Right Arm)   Pulse 71   Temp 98.2 F (36.8 C) (Oral)   Resp (!) 24   Ht 5' (1.524 m)   Wt 78 kg   LMP 10/05/2023 (Approximate)   SpO2 100%   BMI 33.59 kg/m  Physical Exam Vitals and nursing note reviewed.   Constitutional:      General: She is not in acute distress.    Appearance: Normal appearance.  HENT:     Head: Normocephalic and atraumatic.  Eyes:     Conjunctiva/sclera: Conjunctivae normal.  Cardiovascular:     Rate and Rhythm: Normal rate.     Pulses:          Radial pulses are 2+ on the right side and 2+ on the left side.  Pulmonary:     Effort: Pulmonary effort is normal. No respiratory distress.  Musculoskeletal:     Comments: TTP of medial aspect of right wrist as well as over medial carpal bones.  Pain with making fist, flexion, and extension of right wrist.  No overlying skin changes, swelling, warmth, erythema, nor pallor.  Skin:    Capillary Refill: Capillary refill takes less than 2 seconds.     Coloration: Skin is not jaundiced or pale.  Neurological:     Mental Status: She is alert and oriented to person, place, and time. Mental status is at baseline.     Comments: Acting appropriately.  Ambulates without difficulty.  Sensation 2/2 of C4-T2.  Sharp sensation intact.  Median, radial, ulnar, AIN nerves intact.     ED Results / Procedures / Treatments   Labs (all labs ordered are listed, but only abnormal results are displayed) Labs Reviewed - No data to display  EKG None  Radiology DG Wrist Complete Right Result Date: 10/19/2023 CLINICAL DATA:  Right wrist pain for 2 days without known injury. EXAM: RIGHT WRIST - COMPLETE 3+ VIEW COMPARISON:  None Available. FINDINGS: There is no evidence of fracture or dislocation. Rounded irregular lucency is seen involving the lunate bone which may represent some chondral cyst secondary to degenerative change or possibly avascular necrosis. Soft tissues are unremarkable. IMPRESSION: Rounded irregular lucency seen in lunate bone. MRI is recommended for further evaluation. Electronically Signed   By: Lupita Raider M.D.   On: 10/19/2023 13:58    Procedures Procedures    Medications Ordered in ED Medications  naproxen  (NAPROSYN) tablet 500 mg (500 mg Oral Given 10/19/23 1455)    ED Course/ Medical Decision Making/ A&P                                 Medical Decision Making Amount and/or Complexity of Data Reviewed Radiology: ordered.  Risk Prescription drug management.   Patient presents to the ED for concern of right wrist pain for past 2 days, this involves an extensive number of treatment options, and is a complaint that carries with it a high risk of complications and morbidity.  The differential diagnosis includes traumatic injury, fracture, sprain, neoplasm.  Less likely infection, septic joint, nerve injury, vascular injury   Co morbidities that complicate the patient evaluation  None   Additional history obtained:  Additional history obtained from Nursing and Outside Medical Records   External records from outside source obtained and reviewed including  Triage RN note Recent medical evaluation not pertinent to today's visit Medication list    Imaging Studies ordered:  I ordered imaging studies including right wrist x-ray I independently visualized and interpreted imaging which showed rounded irregular lucency seen in lunate bone.  MRI is recommended for further evaluation I agree with the radiologist interpretation    Medicines ordered and prescription drug management:  I ordered medication including naprosyn  for pain  Reevaluation of the patient after these medicines showed that the patient improved I have reviewed the patients home medicines and have made adjustments as needed    Problem List / ED Course:  Right hand and wrist pain Tenderness to palpation of right carpal bones and right wrist.  Pain with flexion extension of wrist Low suspicion for traumatic injury, septic joint, infection. Neuro intact X-ray recommends MRI for further evaluation of lucency on lunate bone of right hand Provided naproxen [facility] department There is no MRI  available here at [facility] today.  I discussed patient with Dr. Freida Busman who agrees to accept patient for MRI at [facility] Patient's brother drove her here and will drive her to [facility] to obtain MRI I discussed risks of POV transportation to [facility].  She expresses understanding agrees with plan.  All questions answered to her satisfaction.   Reevaluation:  After the interventions noted above, I reevaluated the patient and found that they have :improved    Dispostion:  After consideration of the diagnostic results and the patients response to treatment, I feel that the patent would benefit from ED to ED transfer to [facility] for MRI and further management.   Final Clinical Impression(s) / ED Diagnoses Final diagnoses:  Right wrist pain    Rx / DC Orders ED Discharge Orders     None         Judithann Sheen, PA 10/19/23 1549    Durwin Glaze, MD 10/20/23 [phone]

## 2023-10-19 NOTE — ED Triage Notes (Signed)
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 Pain worse with movement. RICE and ibuprofen with some relief.

## 2023-10-19 NOTE — ED Notes (Addendum)
Pt alert and oriented X 4 at the time of discharge. RR even and unlabored. No acute distress noted. Pt verbalized understanding of discharge instructions as discussed. Pt going pov to [facility] ED. Pt ambulatory to lobby at time of discharge.

## 2023-10-19 NOTE — ED Notes (Signed)
Wrist brace placed on right wrist and PT given adapt care paperwork. Other copy filed.

## 2023-10-19 NOTE — ED Notes (Signed)
Pt. currently at MRI .  

## 2023-10-20 NOTE — ED Provider Notes (Signed)
  Physical Exam  BP 122/78 (BP Location: Left Arm)   Pulse 77   Temp 98.6 F (37 C) (Oral)   Resp 20   Ht 5' (1.524 m)   Wt 78 kg   LMP 10/05/2023 (Approximate)   SpO2 99%   BMI 33.59 kg/m   Physical Exam Vitals and nursing note reviewed.  Constitutional:      General: She is not in acute distress.    Appearance: She is well-developed.  HENT:     Head: Normocephalic and atraumatic.  Eyes:     Conjunctiva/sclera: Conjunctivae normal.  Cardiovascular:     Rate and Rhythm: Normal rate and regular rhythm.     Heart sounds: No murmur heard. Pulmonary:     Effort: Pulmonary effort is normal. No respiratory distress.     Breath sounds: Normal breath sounds.  Abdominal:     Palpations: Abdomen is soft.     Tenderness: There is no abdominal tenderness.  Musculoskeletal:        General: Tenderness present. No swelling.     Cervical back: Neck supple.  Skin:    General: Skin is warm and dry.     Capillary Refill: Capillary refill takes less than 2 seconds.  Neurological:     Mental Status: She is alert.  Psychiatric:        Mood and Affect: Mood normal.     Procedures  Procedures  ED Course / MDM    Medical Decision Making Amount and/or Complexity of Data Reviewed Radiology: ordered.  Risk Prescription drug management.   Patient received in transfer.  Wrist pain pending MRI.  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  Placed in a splint, medication given for pain control and and a referral was sent to outpatient hand surgery.       Glendora Score, MD 10/20/23 [phone]

## 2023-10-21 ENCOUNTER — Emergency Department (HOSPITAL_BASED_OUTPATIENT_CLINIC_OR_DEPARTMENT_OTHER)
Admission: EM | Admit: 2023-10-21 | Discharge: 2023-10-22 | Disposition: A | Discharge status: Home / Self Care | Payer: Self-pay | Attending: Emergency Medicine | Admitting: Emergency Medicine

## 2023-10-21 ENCOUNTER — Other Ambulatory Visit: Payer: Self-pay

## 2023-10-21 ENCOUNTER — Encounter (HOSPITAL_BASED_OUTPATIENT_CLINIC_OR_DEPARTMENT_OTHER): Payer: Self-pay | Admitting: Urology

## 2023-10-21 DIAGNOSIS — M779 Enthesopathy, unspecified: Secondary | ICD-10-CM | POA: Insufficient documentation

## 2023-10-21 MED ORDER — KETOROLAC TROMETHAMINE 30 MG/ML IJ SOLN
15.0000 mg | Freq: Once | INTRAMUSCULAR | Status: AC
  Administered 2023-10-22: 15 mg via INTRAVENOUS
  Filled 2023-10-21: qty 1

## 2023-10-21 MED ORDER — LIDOCAINE HCL (PF) 1 % IJ SOLN
5.0000 mL | Freq: Once | INTRAMUSCULAR | Status: AC
  Administered 2023-10-22: 5 mL
  Filled 2023-10-21: qty 5

## 2023-10-21 NOTE — ED Triage Notes (Signed)
Pt states continued right wrist pain since last seen on 2/1 Pain worse with movement  Is in a lot of pain but does not have money to see ortho  Has brace to right wrist at this time

## 2023-10-22 LAB — CBC WITH DIFFERENTIAL/PLATELET
Abs Immature Granulocytes: 0.02 10*3/uL (ref 0.00–0.07)
Basophils Absolute: 0.1 10*3/uL (ref 0.0–0.1)
Basophils Relative: 1 %
Eosinophils Absolute: 0.1 10*3/uL (ref 0.0–0.5)
Eosinophils Relative: 2 %
HCT: 32 % — ABNORMAL LOW (ref 36.0–46.0)
Hemoglobin: 10.7 g/dL — ABNORMAL LOW (ref 12.0–15.0)
Immature Granulocytes: 0 %
Lymphocytes Relative: 41 %
Lymphs Abs: 3.5 10*3/uL (ref 0.7–4.0)
MCH: 29.8 pg (ref 26.0–34.0)
MCHC: 33.4 g/dL (ref 30.0–36.0)
MCV: 89.1 fL (ref 80.0–100.0)
Monocytes Absolute: 0.4 10*3/uL (ref 0.1–1.0)
Monocytes Relative: 5 %
Neutro Abs: 4.4 10*3/uL (ref 1.7–7.7)
Neutrophils Relative %: 51 %
Platelets: 238 10*3/uL (ref 150–400)
RBC: 3.59 MIL/uL — ABNORMAL LOW (ref 3.87–5.11)
RDW: 12.8 % (ref 11.5–15.5)
WBC: 8.5 10*3/uL (ref 4.0–10.5)
nRBC: 0 % (ref 0.0–0.2)

## 2023-10-22 LAB — BASIC METABOLIC PANEL
Anion gap: 6 (ref 5–15)
BUN: 15 mg/dL (ref 6–20)
CO2: 24 mmol/L (ref 22–32)
Calcium: 8.6 mg/dL — ABNORMAL LOW (ref 8.9–10.3)
Chloride: 106 mmol/L (ref 98–111)
Creatinine, Ser: 0.74 mg/dL (ref 0.44–1.00)
GFR, Estimated: >60 mL/min (ref >60)
Glucose, Bld: 97 mg/dL (ref 70–99)
Potassium: 3.3 mmol/L — ABNORMAL LOW (ref 3.5–5.1)
Sodium: 136 mmol/L (ref 135–145)

## 2023-10-22 LAB — SEDIMENTATION RATE: Sed Rate: 18 mm/h (ref 0–22)

## 2023-10-22 LAB — C-REACTIVE PROTEIN: CRP: <0.5 mg/dL (ref <1.0)

## 2023-10-22 MED ORDER — PREDNISONE 20 MG PO TABS: 40.0000 mg | ORAL_TABLET | Freq: Every day | ORAL | 0 refills | Status: DC

## 2023-10-22 NOTE — ED Provider Notes (Signed)
 [place] [facility] AT [facility] Provider Note   CSN: [id number] Arrival date & time: 10/21/23  1830     History  Chief Complaint  Patient presents with   Wrist Pain    Lorraine [name] is a 38 y.o. female.  Patient returns to the emergency department for worsening pain in her right wrist.  She was seen t 3 days ago with pain, had an abnormal x-ray.  Patient underwent MRI at that time.  She was told to follow-up with hand surgery but reports that she does not have money to do that.  Patient has been wearing the wrist brace, pain is worse.       Home Medications Prior to Admission medications   Medication Sig Start Date End Date Taking? Authorizing Provider  predniSONE  (DELTASONE ) 20 MG tablet Take 2 tablets (40 mg total) by mouth daily with breakfast. 10/22/23  Yes Cejay Cambre, Lonni PARAS, MD  acetaminophen  (TYLENOL ) 500 MG tablet Take 1 tablet (500 mg total) by mouth every 6 (six) hours as needed. 09/13/23   Nivia Colon, PA-C  albuterol  (VENTOLIN  HFA) 108 (90 Base) MCG/ACT inhaler Inhale 2 puffs into the lungs every 4 (four) hours as needed for wheezing or shortness of breath. 08/25/22   Suzette Pac, MD  benzonatate  (TESSALON ) 100 MG capsule Take 1 capsule (100 mg total) by mouth every 8 (eight) hours. 09/13/23   Nivia Colon, PA-C  dicyclomine  (BENTYL ) 20 MG tablet Take 1 tablet (20 mg total) by mouth 2 (two) times daily. 08/20/23   Kingsley, Victoria K, DO  HYDROcodone -acetaminophen  (NORCO/VICODIN) 5-325 MG tablet Take 2 tablets by mouth every 6 (six) hours as needed for severe pain (pain score 7-10). 10/19/23   Kommor, Madison, MD  ketorolac  (TORADOL ) 10 MG tablet Take 1 tablet (10 mg total) by mouth every 6 (six) hours as needed. 09/19/23   Silver Wonda LABOR, PA  meclizine  (ANTIVERT ) 25 MG tablet Take 1 tablet (25 mg total) by mouth 3 (three) times daily as needed for dizziness. 09/19/23   Silver Wonda LABOR, PA  metoCLOPramide  (REGLAN ) 10 MG tablet Take 1 tablet  (10 mg total) by mouth every 6 (six) hours. 08/19/23   Mannie Pac DASEN, DO  omeprazole  (PRILOSEC) 20 MG capsule Take 1 capsule (20 mg total) by mouth daily. 08/19/23 09/18/23  Mannie Pac T, DO  ondansetron  (ZOFRAN -ODT) 4 MG disintegrating tablet Take 1 tablet (4 mg total) by mouth every 8 (eight) hours as needed. 09/19/23   Silver Wonda LABOR, PA  oseltamivir  (TAMIFLU ) 75 MG capsule Take 1 capsule (75 mg total) by mouth every 12 (twelve) hours. 09/17/23   Veta Palma, PA-C  pantoprazole  (PROTONIX ) 20 MG tablet Take 1 tablet (20 mg total) by mouth daily for 14 days. 07/25/23 08/08/23  Neysa Caron PARAS, DO  promethazine  (PHENERGAN ) 25 MG suppository Place 1 suppository (25 mg total) rectally every 6 (six) hours as needed for nausea or vomiting. 09/19/23   Silver Wonda LABOR, PA  sucralfate  (CARAFATE ) 1 GM/10ML suspension Take 10 mLs (1 g total) by mouth 4 (four) times daily -  with meals and at bedtime. 08/02/23   Trine Raynell Moder, MD  cetirizine  (ZYRTEC  ALLERGY) 10 MG tablet Take 1 tablet (10 mg total) by mouth 2 (two) times daily. Patient not taking: Reported on 12/06/2020 11/12/20 01/24/21  Muthersbaugh, Chiquita, PA-C      Allergies    Iodinated contrast media, Lupine bean extract, Ceftriaxone , and Morphine     Review of Systems   Review of Systems  Physical Exam Updated Vital Signs BP 129/79 (BP Location: Left Arm)   Pulse 67   Temp 98 F (36.7 C)   Resp 16   Ht 5' (1.524 m)   Wt 78 kg   LMP 10/05/2023 (Approximate)   SpO2 100%   BMI 33.58 kg/m  Physical Exam Vitals and nursing note reviewed.  Constitutional:      Appearance: Normal appearance.  HENT:     Head: Normocephalic and atraumatic.  Cardiovascular:     Rate and Rhythm: Normal rate.  Pulmonary:     Effort: Pulmonary effort is normal.  Musculoskeletal:     Right wrist: Swelling, effusion, tenderness and snuff box tenderness present. No deformity or lacerations. Decreased range of motion. Normal pulse.   Neurological:     Mental Status: She is alert.     ED Results / Procedures / Treatments   Labs (all labs ordered are listed, but only abnormal results are displayed) Labs Reviewed  CBC WITH DIFFERENTIAL/PLATELET - Abnormal; Notable for the following components:      Result Value   RBC 3.59 (*)    Hemoglobin 10.7 (*)    HCT 32.0 (*)    All other components within normal limits  BASIC METABOLIC PANEL - Abnormal; Notable for the following components:   Potassium 3.3 (*)    Calcium  8.6 (*)    All other components within normal limits  BODY FLUID CULTURE W GRAM STAIN  SEDIMENTATION RATE  C-REACTIVE PROTEIN  SYNOVIAL CELL COUNT + DIFF, W/ CRYSTALS    EKG None  Radiology No results found.  Procedures .Joint Aspiration/Arthrocentesis  Date/Time: 10/22/2023 12:30 AM  Performed by: Haze Lonni PARAS, MD Authorized by: Haze Lonni PARAS, MD   Consent:    Consent obtained:  Verbal   Consent given by:  Patient   Risks, benefits, and alternatives were discussed: yes     Risks discussed:  Bleeding, infection and pain Universal protocol:    Procedure explained and questions answered to patient or proxy's satisfaction: yes     Relevant documents present and verified: yes     Test results available: yes     Imaging studies available: yes     Required blood products, implants, devices, and special equipment available: yes     Site/side marked: yes     Immediately prior to procedure, a time out was called: yes     Patient identity confirmed:  Verbally with patient Location:    Location:  Wrist   Wrist:  R radiocarpal Anesthesia:    Anesthesia method:  Local infiltration   Local anesthetic:  Lidocaine  1% w/o epi Procedure details:    Preparation: Patient was prepped and draped in usual sterile fashion     Needle gauge: 21 G.   Approach:  Anterior   Aspirate characteristics:  Clear   Specimen collected: yes   Post-procedure details:    Dressing:  Adhesive  bandage   Procedure completion:  Tolerated well, no immediate complications     Medications Ordered in ED Medications  lidocaine  (PF) (XYLOCAINE ) 1 % injection 5 mL (5 mLs Infiltration Given 10/22/23 0013)  ketorolac  (TORADOL ) 30 MG/ML injection 15 mg (15 mg Intravenous Given 10/22/23 0304)    ED Course/ Medical Decision Making/ A&P                                 Medical Decision Making Amount and/or Complexity of Data Reviewed External Data  Reviewed: radiology and notes. Labs: ordered.  Risk Prescription drug management.   Patient reports increased pain.  I did review her MRI from the other day.  There was joint effusion.  This seemed to be in conjunction with tendinitis but septic arthritis is still in the differential.  I recommended arthrocentesis and it was performed.  I was able to get less than 0.25 mL of clear synovial fluid.  This was not enough for cell count, differential or crystals.  Culture is pending.  She does not have a white count.  Sed rate is normal.  There is no redness or warmth of the joint.  Suspect original diagnosis of tendinitis is correct.  Continue brace, provide analgesia.  Follow-up with hand surgery.        Final Clinical Impression(s) / ED Diagnoses Final diagnoses:  Tendinitis    Rx / DC Orders ED Discharge Orders          Ordered    predniSONE  (DELTASONE ) 20 MG tablet  Daily with breakfast        10/22/23 0351              Haze Lonni PARAS, MD 10/22/23 [phone]

## 2023-10-25 ENCOUNTER — Emergency Department (HOSPITAL_COMMUNITY)
Admission: EM | Admit: 2023-10-25 | Discharge: 2023-10-25 | Disposition: A | Discharge status: Home / Self Care | Payer: Self-pay | Attending: Emergency Medicine | Admitting: Emergency Medicine

## 2023-10-25 ENCOUNTER — Encounter (HOSPITAL_COMMUNITY): Payer: Self-pay

## 2023-10-25 ENCOUNTER — Emergency Department (HOSPITAL_COMMUNITY): Payer: Self-pay

## 2023-10-25 ENCOUNTER — Other Ambulatory Visit: Payer: Self-pay

## 2023-10-25 DIAGNOSIS — M25531 Pain in right wrist: Secondary | ICD-10-CM | POA: Insufficient documentation

## 2023-10-25 LAB — BODY FLUID CULTURE W GRAM STAIN: Culture: NO GROWTH

## 2023-10-25 MED ORDER — ACETAMINOPHEN 500 MG PO TABS
1000.0000 mg | ORAL_TABLET | Freq: Once | ORAL | Status: AC
  Administered 2023-10-25: 1000 mg via ORAL
  Filled 2023-10-25: qty 2

## 2023-10-25 NOTE — ED Triage Notes (Signed)
 Patient has had right wrist pain for 8 days. No falls. Wearing a brace on arrival.

## 2023-10-25 NOTE — ED Provider Notes (Signed)
 [place] [facility] AT [facility] Provider Note   CSN: [id number] Arrival date & time: 10/25/23  1748     History  Chief Complaint  Patient presents with   Wrist Pain   HPI Lorraine [name] is a 38 y.o. female presenting right wrist pain. Started 8 days ago. Pain is all about the wrist joint. Denies trauma.  Endorses some swelling but no erythema.  Denies fever.  States she has been using Tylenol  and ibuprofen  which has helped somewhat.   Wrist Pain       Home Medications Prior to Admission medications   Medication Sig Start Date End Date Taking? Authorizing Provider  acetaminophen  (TYLENOL ) 500 MG tablet Take 1 tablet (500 mg total) by mouth every 6 (six) hours as needed. 09/13/23   Nivia Colon, PA-C  albuterol  (VENTOLIN  HFA) 108 [phone] Base) MCG/ACT inhaler Inhale 2 puffs into the lungs every 4 (four) hours as needed for wheezing or shortness of breath. 08/25/22   Suzette Pac, MD  benzonatate  (TESSALON ) 100 MG capsule Take 1 capsule (100 mg total) by mouth every 8 (eight) hours. 09/13/23   Nivia Colon, PA-C  dicyclomine  (BENTYL ) 20 MG tablet Take 1 tablet (20 mg total) by mouth 2 (two) times daily. 08/20/23   Kingsley, Victoria K, DO  HYDROcodone -acetaminophen  (NORCO/VICODIN) 5-325 MG tablet Take 2 tablets by mouth every 6 (six) hours as needed for severe pain (pain score 7-10). 10/19/23   Kommor, Madison, MD  ketorolac  (TORADOL ) 10 MG tablet Take 1 tablet (10 mg total) by mouth every 6 (six) hours as needed. 09/19/23   Silver Wonda LABOR, PA  meclizine  (ANTIVERT ) 25 MG tablet Take 1 tablet (25 mg total) by mouth 3 (three) times daily as needed for dizziness. 09/19/23   Silver Wonda LABOR, PA  metoCLOPramide  (REGLAN ) 10 MG tablet Take 1 tablet (10 mg total) by mouth every 6 (six) hours. 08/19/23   Mannie Pac DASEN, DO  omeprazole  (PRILOSEC) 20 MG capsule Take 1 capsule (20 mg total) by mouth daily. 08/19/23 09/18/23  Mannie Pac T, DO  ondansetron  (ZOFRAN -ODT) 4  MG disintegrating tablet Take 1 tablet (4 mg total) by mouth every 8 (eight) hours as needed. 09/19/23   Silver Wonda LABOR, PA  oseltamivir  (TAMIFLU ) 75 MG capsule Take 1 capsule (75 mg total) by mouth every 12 (twelve) hours. 09/17/23   Veta Palma, PA-C  pantoprazole  (PROTONIX ) 20 MG tablet Take 1 tablet (20 mg total) by mouth daily for 14 days. 07/25/23 08/08/23  Neysa Caron PARAS, DO  predniSONE  (DELTASONE ) 20 MG tablet Take 2 tablets (40 mg total) by mouth daily with breakfast. 10/22/23   Pollina, Lonni PARAS, MD  promethazine  (PHENERGAN ) 25 MG suppository Place 1 suppository (25 mg total) rectally every 6 (six) hours as needed for nausea or vomiting. 09/19/23   Silver Wonda LABOR, PA  sucralfate  (CARAFATE ) 1 GM/10ML suspension Take 10 mLs (1 g total) by mouth 4 (four) times daily -  with meals and at bedtime. 08/02/23   Trine Raynell Moder, MD  cetirizine  (ZYRTEC  ALLERGY) 10 MG tablet Take 1 tablet (10 mg total) by mouth 2 (two) times daily. Patient not taking: Reported on 12/06/2020 11/12/20 01/24/21  Muthersbaugh, Chiquita, PA-C      Allergies    Iodinated contrast media, Lupine bean extract, Ceftriaxone , and Morphine     Review of Systems   See HPI for pertinent positives  Physical Exam Updated Vital Signs BP (!) 138/98   Pulse 77   Temp 98.7 F (37.1 C) (  Oral)   Resp 17   Ht 5' (1.524 m)   Wt 78 kg   LMP 10/05/2023 (Approximate)   SpO2 98%   BMI 33.58 kg/m  Physical Exam Constitutional:      Appearance: Normal appearance.  HENT:     Head: Normocephalic.     Nose: Nose normal.  Eyes:     Conjunctiva/sclera: Conjunctivae normal.  Pulmonary:     Effort: Pulmonary effort is normal.  Musculoskeletal:     Right wrist: Tenderness present. No swelling, deformity, effusion or snuff box tenderness. Normal range of motion. Normal pulse.     Left wrist: Normal. Normal pulse.  Neurological:     Mental Status: She is alert.  Psychiatric:        Mood and Affect: Mood normal.      ED Results / Procedures / Treatments   Labs (all labs ordered are listed, but only abnormal results are displayed) Labs Reviewed - No data to display  EKG None  Radiology DG Wrist Complete Right Result Date: 10/25/2023 CLINICAL DATA:  Right wrist pain for the past 8 days.  No injury. EXAM: RIGHT WRIST - COMPLETE 3+ VIEW COMPARISON:  Right wrist x-rays dated October 19, 2023. FINDINGS: No acute fracture or dislocation. Joint spaces are preserved. Unchanged degenerative cyst in the ulnar aspect of the lunate. Bone mineralization is normal. Soft tissues are unremarkable. IMPRESSION: 1. No acute osseous abnormality. Electronically Signed   By: Elsie ONEIDA Shoulder M.D.   On: 10/25/2023 18:58    Procedures Procedures    Medications Ordered in ED Medications  acetaminophen  (TYLENOL ) tablet 1,000 mg (has no administration in time range)    ED Course/ Medical Decision Making/ A&P                                 Medical Decision Making Amount and/or Complexity of Data Reviewed Radiology: ordered.   38 yo well appearing for right wrist pain. Exam notable for generalized wrist tenderness otherwise unremarkable.  DDx includes fracture, dislocation, septic joint, gout.  I personally reviewed and interpreted x-ray which did not reveal any acute osseous abnormality.  The joint itself does not appear infected or suggestive of gout.  Advised continued supportive treatment at home and follow-up with her PCP.  Discharged good condition.        Final Clinical Impression(s) / ED Diagnoses Final diagnoses:  Right wrist pain    Rx / DC Orders ED Discharge Orders     None         Lang Norleen POUR, PA-C 10/25/23 2112    Cottie Donnice PARAS, MD 10/25/23 [phone]

## 2023-10-25 NOTE — Discharge Instructions (Signed)
 Evaluation today was overall reassuring.  Recommend that you continue supportive treatment at home which includes resting your wrist, applying ice 3-4 times a day for the next 4 days, you can also take ibuprofen  and Tylenol .  Please follow-up your PCP.

## 2023-11-11 ENCOUNTER — Encounter (HOSPITAL_COMMUNITY): Payer: Self-pay | Admitting: Emergency Medicine

## 2023-11-11 ENCOUNTER — Emergency Department (HOSPITAL_COMMUNITY)
Admission: EM | Admit: 2023-11-11 | Discharge: 2023-11-11 | Disposition: A | Discharge status: Home / Self Care | Payer: Self-pay | Attending: Emergency Medicine | Admitting: Emergency Medicine

## 2023-11-11 ENCOUNTER — Emergency Department (HOSPITAL_COMMUNITY): Payer: Self-pay

## 2023-11-11 ENCOUNTER — Other Ambulatory Visit: Payer: Self-pay

## 2023-11-11 DIAGNOSIS — X500XXA Overexertion from strenuous movement or load, initial encounter: Secondary | ICD-10-CM | POA: Insufficient documentation

## 2023-11-11 DIAGNOSIS — S93401A Sprain of unspecified ligament of right ankle, initial encounter: Secondary | ICD-10-CM | POA: Insufficient documentation

## 2023-11-11 DIAGNOSIS — M25532 Pain in left wrist: Secondary | ICD-10-CM | POA: Insufficient documentation

## 2023-11-11 DIAGNOSIS — Y9302 Activity, running: Secondary | ICD-10-CM | POA: Insufficient documentation

## 2023-11-11 DIAGNOSIS — J45909 Unspecified asthma, uncomplicated: Secondary | ICD-10-CM | POA: Insufficient documentation

## 2023-11-11 DIAGNOSIS — M25572 Pain in left ankle and joints of left foot: Secondary | ICD-10-CM | POA: Insufficient documentation

## 2023-11-11 NOTE — ED Triage Notes (Signed)
 Patient had mechanical fall, c/o right ankle pain.

## 2023-11-11 NOTE — ED Provider Notes (Signed)
 [place] [facility] AT [facility] Provider Note   CSN: [id number] Arrival date & time: 11/11/23  0059     History  Chief Complaint  Patient presents with   Marletta Lor    Lovey Crupi is a 38 y.o. female.  The history is provided by the patient. The history is limited by a language barrier. A language interpreter was used.  Fall  Patient is a 38 year old female presents the ED today with left wrist pain and left ankle pain after suffering a fall while running away from neighbors dog due to her fear of dogs.  Previous medical history of diverticulitis, asthma, pyelonephritis, hypokalemia, pancreatitis, GERD, intractable vomiting.  States that she fell onto her left side of her body with her left wrist being extended and twisting left ankle.  And has ambulated since fall.  Denies neck pain, vision changes, chest pain, shortness of breath, abdominal pain, vomiting,, weakness, numbness, tingling.     Home Medications Prior to Admission medications   Medication Sig Start Date End Date Taking? Authorizing Provider  acetaminophen (TYLENOL) 500 MG tablet Take 1 tablet (500 mg total) by mouth every 6 (six) hours as needed. 09/13/23   Fayrene Helper, PA-C  albuterol (VENTOLIN HFA) 108 (90 Base) MCG/ACT inhaler Inhale 2 puffs into the lungs every 4 (four) hours as needed for wheezing or shortness of breath. 08/25/22   Bethann Berkshire, MD  benzonatate (TESSALON) 100 MG capsule Take 1 capsule (100 mg total) by mouth every 8 (eight) hours. 09/13/23   Fayrene Helper, PA-C  dicyclomine (BENTYL) 20 MG tablet Take 1 tablet (20 mg total) by mouth 2 (two) times daily. 08/20/23   Rexford Maus, DO  HYDROcodone-acetaminophen (NORCO/VICODIN) 5-325 MG tablet Take 2 tablets by mouth every 6 (six) hours as needed for severe pain (pain score 7-10). 10/19/23   Kommor, Madison, MD  ketorolac (TORADOL) 10 MG tablet Take 1 tablet (10 mg total) by mouth every 6 (six) hours as needed. 09/19/23    Peter Garter, PA  meclizine (ANTIVERT) 25 MG tablet Take 1 tablet (25 mg total) by mouth 3 (three) times daily as needed for dizziness. 09/19/23   Peter Garter, PA  metoCLOPramide (REGLAN) 10 MG tablet Take 1 tablet (10 mg total) by mouth every 6 (six) hours. 08/19/23   Arletha Pili, DO  omeprazole (PRILOSEC) 20 MG capsule Take 1 capsule (20 mg total) by mouth daily. 08/19/23 09/18/23  Anders Simmonds T, DO  ondansetron (ZOFRAN-ODT) 4 MG disintegrating tablet Take 1 tablet (4 mg total) by mouth every 8 (eight) hours as needed. 09/19/23   Peter Garter, PA  oseltamivir (TAMIFLU) 75 MG capsule Take 1 capsule (75 mg total) by mouth every 12 (twelve) hours. 09/17/23   Arabella Merles, PA-C  pantoprazole (PROTONIX) 20 MG tablet Take 1 tablet (20 mg total) by mouth daily for 14 days. 07/25/23 08/08/23  Coral Spikes, DO  predniSONE (DELTASONE) 20 MG tablet Take 2 tablets (40 mg total) by mouth daily with breakfast. 10/22/23   Pollina, Canary Brim, MD  promethazine (PHENERGAN) 25 MG suppository Place 1 suppository (25 mg total) rectally every 6 (six) hours as needed for nausea or vomiting. 09/19/23   Peter Garter, PA  sucralfate (CARAFATE) 1 GM/10ML suspension Take 10 mLs (1 g total) by mouth 4 (four) times daily -  with meals and at bedtime. 08/02/23   Nira Conn, MD  cetirizine (ZYRTEC ALLERGY) 10 MG tablet Take 1 tablet (10 mg total) by  mouth 2 (two) times daily. Patient not taking: Reported on 12/06/2020 11/12/20 01/24/21  Muthersbaugh, Dahlia Client, PA-C      Allergies    Iodinated contrast media, Lupine bean extract, Ceftriaxone, and Morphine    Review of Systems   Review of Systems  Musculoskeletal:  Positive for arthralgias.  All other systems reviewed and are negative.   Physical Exam Updated Vital Signs BP 108/81   Pulse 68   Temp 97.7 F (36.5 C) (Oral)   Resp 16   Wt 80 kg   LMP 10/05/2023 (Approximate)   SpO2 100%   BMI 34.44 kg/m  Physical Exam Vitals  and nursing note reviewed.  Constitutional:      General: She is not in acute distress.    Appearance: Normal appearance. She is not ill-appearing.  HENT:     Head: Normocephalic and atraumatic.  Eyes:     Extraocular Movements: Extraocular movements intact.     Conjunctiva/sclera: Conjunctivae normal.  Cardiovascular:     Rate and Rhythm: Normal rate and regular rhythm.     Pulses: Normal pulses.     Heart sounds: Normal heart sounds. No murmur heard.    No friction rub. No gallop.  Pulmonary:     Effort: Pulmonary effort is normal. No respiratory distress.     Breath sounds: Normal breath sounds.  Abdominal:     General: Abdomen is flat.     Palpations: Abdomen is soft.     Tenderness: There is no abdominal tenderness.  Musculoskeletal:        General: Swelling (Right ankle edema noted with tenderness to the dorsal aspect of foot and inferior lateral and medial of right ankle) and tenderness (Patient noted to have tenderness over the right wrist upon palpation.) present. No deformity.     Cervical back: Normal range of motion and neck supple. No rigidity or tenderness.  Skin:    General: Skin is warm and dry.     Coloration: Skin is not jaundiced or pale.     Findings: No bruising or erythema.  Neurological:     General: No focal deficit present.     Mental Status: She is alert and oriented to person, place, and time. Mental status is at baseline.     Sensory: No sensory deficit.     Motor: No weakness.  Psychiatric:        Mood and Affect: Mood normal.     ED Results / Procedures / Treatments   Labs (all labs ordered are listed, but only abnormal results are displayed) Labs Reviewed - No data to display  EKG None  Radiology DG Wrist Complete Right Result Date: 11/11/2023 CLINICAL DATA:  Fall.  Pain. EXAM: RIGHT WRIST - COMPLETE 3+ VIEW COMPARISON:  10/25/23. FINDINGS: No signs of acute fracture or dislocation. Joint spaces are well preserved. Unchanged cyst within the  ulnar aspect of the lunate bone. No new findings. IMPRESSION: 1. No acute findings. 2. Unchanged cyst within the ulnar aspect of the lunate bone. Electronically Signed   By: Signa Kell M.D.   On: 11/11/2023 08:15   DG Ankle Complete Right Result Date: 11/11/2023 CLINICAL DATA:  Fall EXAM: RIGHT ANKLE - COMPLETE 3+ VIEW COMPARISON:  None Available. FINDINGS: There is mild soft tissue swelling surrounding the ankle. Punctate well corticated densities are seen adjacent to the tip of the lateral malleolus and medial malleolus, most likely related to old injury. No definite acute fractures are seen. Joint spaces are well maintained and alignment is  anatomic. IMPRESSION: Mild soft tissue swelling surrounding the ankle. No definite acute fractures. Electronically Signed   By: Darliss Cheney M.D.   On: 11/11/2023 01:58    Procedures Procedures    Medications Ordered in ED Medications - No data to display  ED Course/ Medical Decision Making/ A&P                                 Medical Decision Making Amount and/or Complexity of Data Reviewed Radiology: ordered.   This patient is a 38 year old female who presents to the ED for concern of right ankle and right wrist pain post mechanical fall.   Differential diagnoses prior to evaluation: The emergent differential diagnosis includes, but is not limited to, fracture, ligamentous injury, muscle strain, sprain. This is not an exhaustive differential.   Past Medical History / Co-morbidities / Social History: diverticulitis, asthma, pyelonephritis, hypokalemia, pancreatitis, GERD, intractable vomiting.  Additional history: Chart reviewed. Pertinent results include: Patient recently seen 3 times in the last month for right wrist pain.  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  Lab Tests/Imaging studies: I  personally interpreted labs/imaging and the pertinent results include:   X-ray of right wrist shows No acute findings with an unchanged cyst to the ulnar aspect of the lunate bone X-ray of right ankle shows mild soft tissue swelling with no acute fractures. I agree with the radiologist interpretation.    Medications: No medications needed for this visit I have reviewed the patients home medicines and have made adjustments as needed.   ED Course:  Patient is a 38 year old female presents the ED today after mechanical fall after running away from a dog where she tripped and fell onto the right wrist and twisted right ankle.  Does not complain of any other pain.  Has ambulated since the fall.  Did not hit head, no loss of consciousness.  Exam is notable for mild edema noted to right ankle, with tenderness noted.  Full range of motion intact, pulses intact, sensation intact.  Patient also has tenderness over the right wrist.  Exam is otherwise unremarkable  X-rays were done which did not show any acute fractures, no dislocation of right wrist or right ankle.  Suspicion for any emergent process present this time, will recommend that she follow-up with Ortho for further evaluation of her right ankle, and meantime we will do symptomatic management.  Right ankle sprain due to patient saying she is unable to bear weight on her ankle, despite saying that she needed to have walked with assistance.  Patient vital signs remained stable throughout the course of her time here.  I believe patient is safe to discharge at this time and follow-up outpatient.  Disposition: After consideration of the diagnostic results and the patients response to treatment, I feel that the patient benefit from discharge treatment as above.   emergency department workup does not suggest an emergent condition requiring admission or immediate intervention beyond what has been performed at this time. The plan is: Symptomatic treatment,  follow-up with Ortho, return for new or worsening symptoms. The patient is safe for discharge and has been instructed to return immediately for worsening symptoms, change in symptoms or any other concerns.  Final Clinical Impression(s) / ED Diagnoses Final diagnoses:  Sprain of right ankle,  unspecified ligament, initial encounter    Rx / DC Orders ED Discharge Orders     None         Lunette Stands, PA-C 11/11/23 1005    Anders Simmonds T, DO 11/13/23 [phone]

## 2023-11-11 NOTE — Discharge Instructions (Addendum)
 You were seen today for right ankle sprain.  No fracture dislocations noted today.  Recommend that you follow-up with Ortho for further evaluation at this time.  You can take Tylenol and ibuprofen for pain relief as well as be sure to rest and elevate leg to prevent further swelling.  Use ice for further pain relief.  Take Tylenol (acetominophen)  650mg  every 4-6 hours, as needed for pain or fever. Do not take more than 4,000 mg in a 24-hour period. As this may cause liver damage. While this is rare, if you begin to develop yellowing of the skin or eyes, stop taking and return to ER immediately.  Take Ibuprofen 400mg  every 4-6 hours for pain or fever, not exceeding 3,200 mg per day as more than 3,200mg  can cause Stomach irritation, dizziness, kidney issues with long-term use.  Return to the ED for begin experiencing any new weakness, numbness, tingling, worsening pain

## 2023-11-11 NOTE — Progress Notes (Signed)
 Orthopedic Tech Progress Note Patient Details:  Lorraine [name] 06/01/1986 [id number]  Ortho Devices Type of Ortho Device: CAM walker Ortho Device/Splint Location: RLE Ortho Device/Splint Interventions: Ordered, Application, Adjustment   Post Interventions Patient Tolerated: Well Instructions Provided: Care of device Spoke with ordering provider via secure chat who changed order from splint to cam boot since there is no fracture within the ankle.  Darleen Crocker 11/11/2023, 10:21 AM

## 2024-01-09 ENCOUNTER — Emergency Department (HOSPITAL_BASED_OUTPATIENT_CLINIC_OR_DEPARTMENT_OTHER): Payer: Self-pay

## 2024-01-09 ENCOUNTER — Other Ambulatory Visit: Payer: Self-pay

## 2024-01-09 ENCOUNTER — Encounter (HOSPITAL_BASED_OUTPATIENT_CLINIC_OR_DEPARTMENT_OTHER): Payer: Self-pay | Admitting: Emergency Medicine

## 2024-01-09 ENCOUNTER — Emergency Department (HOSPITAL_BASED_OUTPATIENT_CLINIC_OR_DEPARTMENT_OTHER)
Admission: EM | Admit: 2024-01-09 | Discharge: 2024-01-09 | Disposition: A | Discharge status: Home / Self Care | Payer: Self-pay | Attending: Emergency Medicine | Admitting: Emergency Medicine

## 2024-01-09 DIAGNOSIS — R0602 Shortness of breath: Secondary | ICD-10-CM | POA: Insufficient documentation

## 2024-01-09 DIAGNOSIS — R300 Dysuria: Secondary | ICD-10-CM | POA: Insufficient documentation

## 2024-01-09 DIAGNOSIS — J45909 Unspecified asthma, uncomplicated: Secondary | ICD-10-CM | POA: Insufficient documentation

## 2024-01-09 DIAGNOSIS — E876 Hypokalemia: Secondary | ICD-10-CM | POA: Insufficient documentation

## 2024-01-09 DIAGNOSIS — R509 Fever, unspecified: Secondary | ICD-10-CM | POA: Insufficient documentation

## 2024-01-09 DIAGNOSIS — R059 Cough, unspecified: Secondary | ICD-10-CM | POA: Insufficient documentation

## 2024-01-09 LAB — RESP PANEL BY RT-PCR (RSV, FLU A&B, COVID)  RVPGX2
Influenza A by PCR: NEGATIVE
Influenza B by PCR: NEGATIVE
Resp Syncytial Virus by PCR: NEGATIVE
SARS Coronavirus 2 by RT PCR: NEGATIVE

## 2024-01-09 LAB — URINALYSIS, ROUTINE W REFLEX MICROSCOPIC
Bilirubin Urine: NEGATIVE
Glucose, UA: NEGATIVE mg/dL
Hgb urine dipstick: NEGATIVE
Ketones, ur: NEGATIVE mg/dL
Leukocytes,Ua: NEGATIVE
Nitrite: NEGATIVE
Protein, ur: NEGATIVE mg/dL
Specific Gravity, Urine: 1.025 (ref 1.005–1.030)
pH: 6.5 (ref 5.0–8.0)

## 2024-01-09 LAB — BASIC METABOLIC PANEL WITH GFR
Anion gap: 15 (ref 5–15)
BUN: 13 mg/dL (ref 6–20)
CO2: 18 mmol/L — ABNORMAL LOW (ref 22–32)
Calcium: 9.2 mg/dL (ref 8.9–10.3)
Chloride: 106 mmol/L (ref 98–111)
Creatinine, Ser: 0.74 mg/dL (ref 0.44–1.00)
GFR, Estimated: >60 mL/min (ref >60)
Glucose, Bld: 137 mg/dL — ABNORMAL HIGH (ref 70–99)
Potassium: 3.2 mmol/L — ABNORMAL LOW (ref 3.5–5.1)
Sodium: 139 mmol/L (ref 135–145)

## 2024-01-09 LAB — CBC
HCT: 39.3 % (ref 36.0–46.0)
Hemoglobin: 13.2 g/dL (ref 12.0–15.0)
MCH: 30.1 pg (ref 26.0–34.0)
MCHC: 33.6 g/dL (ref 30.0–36.0)
MCV: 89.5 fL (ref 80.0–100.0)
Platelets: 274 10*3/uL (ref 150–400)
RBC: 4.39 MIL/uL (ref 3.87–5.11)
RDW: 13.1 % (ref 11.5–15.5)
WBC: 5.8 10*3/uL (ref 4.0–10.5)
nRBC: 0 % (ref 0.0–0.2)

## 2024-01-09 LAB — PREGNANCY, URINE: Preg Test, Ur: NEGATIVE

## 2024-01-09 LAB — TROPONIN T, HIGH SENSITIVITY: Troponin T High Sensitivity: <15 ng/L (ref <19)

## 2024-01-09 MED ORDER — ALBUTEROL SULFATE HFA 108 (90 BASE) MCG/ACT IN AERS
2.0000 | INHALATION_SPRAY | Freq: Once | RESPIRATORY_TRACT | Status: AC
Start: 2024-01-09 — End: 2024-01-09
  Administered 2024-01-09: 2 via RESPIRATORY_TRACT
  Filled 2024-01-09: qty 6.7

## 2024-01-09 MED ORDER — PREDNISONE 50 MG PO TABS
60.0000 mg | ORAL_TABLET | Freq: Once | ORAL | Status: AC
  Administered 2024-01-09: 60 mg via ORAL
  Filled 2024-01-09: qty 1
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
## 2024-01-09 NOTE — ED Triage Notes (Signed)
 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 Also reports chest pain since this AM. Reports symptomatic fever, denies known sick contacts.    RT to triage to assess.

## 2024-01-09 NOTE — Discharge Instructions (Addendum)
 You were evaluated in the emergency room for shortness of breath and chest pain. You may use Tylenol  1000 mg and/or Motrin  600 mg every 4-6 hours up to 3 times a day for fever or body aches.  Please keep in mind that this dosing is not meant to be continued long-term and that many over-the-counter cough and flu medications contain acetaminophen  or ibuprofen . You can expect your current symptoms to linger over the next week or two but please return to the emergency room if you experience any new or worsening symptoms including persistent fevers, worsening productive cough and persistent vomiting.   Le evaluaron en urgencias por dificultad para respirar y Journalist, newspaper. Puede usar Tylenol  1000 mg o Motrin  600 mg cada 4 a 6 horas, hasta 3 veces al da, para la fiebre o Orthoptist. [place] Henri en cuenta que esta dosis no debe administrarse a largo plazo y que muchos medicamentos de venta libre para la tos y la gripe contienen acetaminofn o ibuprofeno. Es probable que sus sntomas actuales persistan durante una o [address], [place] regrese a urgencias si experimenta sntomas nuevos o que empeoran, como fiebre persistente, tos productiva que empeora y vmitos persistentes. Consulte con su mdico de cabecera sobre su visita a urgencias. Es su responsabilidad [employer] de [place] visita y revisarlos con su mdico de Information systems manager.Please follow-up with your primary care provider regarding your ER visit.  It is your responsibility to obtain any results from your visit and review any findings with your primary care doctor.

## 2024-01-09 NOTE — ED Provider Notes (Addendum)
 [place] EMERGENCY DEPARTMENT AT [facility] Provider Note   CSN: [id number] Arrival date & time: 01/09/24  1522     History  Chief Complaint  Patient presents with   Shortness of Breath    Lorraine [name] is a 38 y.o. female.  Of asthma presents with complaints of shortness of breath and chest pain that started earlier this morning.  Patient states she lost her inhaler.  She additionally endorses nonproductive cough and subjective fevers.  No vomiting or diarrhea.  Although she does endorse dysuria.  Has a history of pyelonephritis.  She is not on birth control, no recent surgeries or extended travel.  No prior cardiac issues.   Shortness of Breath  Past Medical History:  Diagnosis Date   Angio-edema    Asthma    Inhaler used 01/02/16   Diverticulitis    Nausea & vomiting 04/02/2017   Pancreatitis    Pre-diabetes    Pyelonephritis    Urticaria        Home Medications Prior to Admission medications   Medication Sig Start Date End Date Taking? Authorizing Provider  acetaminophen  (TYLENOL ) 500 MG tablet Take 1 tablet (500 mg total) by mouth every 6 (six) hours as needed. 09/13/23   Debbra Fairy, PA-C  albuterol  (VENTOLIN  HFA) 108 (90 Base) MCG/ACT inhaler Inhale 2 puffs into the lungs every 4 (four) hours as needed for wheezing or shortness of breath. 08/25/22   Cheyenne Cotta, MD  benzonatate  (TESSALON ) 100 MG capsule Take 1 capsule (100 mg total) by mouth every 8 (eight) hours. 09/13/23   Debbra Fairy, PA-C  dicyclomine  (BENTYL ) 20 MG tablet Take 1 tablet (20 mg total) by mouth 2 (two) times daily. 08/20/23   Kingsley, Victoria K, DO  HYDROcodone -acetaminophen  (NORCO/VICODIN) 5-325 MG tablet Take 2 tablets by mouth every 6 (six) hours as needed for severe pain (pain score 7-10). 10/19/23   Kommor, Madison, MD  ketorolac  (TORADOL ) 10 MG tablet Take 1 tablet (10 mg total) by mouth every 6 (six) hours as needed. 09/19/23   [place] Butter, PA  meclizine  (ANTIVERT ) 25  MG tablet Take 1 tablet (25 mg total) by mouth 3 (three) times daily as needed for dizziness. 09/19/23   [place] Butter, PA  metoCLOPramide  (REGLAN ) 10 MG tablet Take 1 tablet (10 mg total) by mouth every 6 (six) hours. 08/19/23   Nathanael Baker, DO  omeprazole  (PRILOSEC) 20 MG capsule Take 1 capsule (20 mg total) by mouth daily. 08/19/23 09/18/23  Afton Horse T, DO  ondansetron  (ZOFRAN -ODT) 4 MG disintegrating tablet Take 1 tablet (4 mg total) by mouth every 8 (eight) hours as needed. 09/19/23   [place] Butter, PA  oseltamivir  (TAMIFLU ) 75 MG capsule Take 1 capsule (75 mg total) by mouth every 12 (twelve) hours. 09/17/23   Rexie Catena, PA-C  pantoprazole  (PROTONIX ) 20 MG tablet Take 1 tablet (20 mg total) by mouth daily for 14 days. 07/25/23 08/08/23  Rolinda Climes, DO  predniSONE  (DELTASONE ) 20 MG tablet Take 2 tablets (40 mg total) by mouth daily with breakfast. 10/22/23   Pollina, Marine Sia, MD  promethazine  (PHENERGAN ) 25 MG suppository Place 1 suppository (25 mg total) rectally every 6 (six) hours as needed for nausea or vomiting. 09/19/23   [place] Butter, PA  sucralfate  (CARAFATE ) 1 GM/10ML suspension Take 10 mLs (1 g total) by mouth 4 (four) times daily -  with meals and at bedtime. 08/02/23   Lindle Rhea, MD  cetirizine  (ZYRTEC  ALLERGY) 10  MG tablet Take 1 tablet (10 mg total) by mouth 2 (two) times daily. Patient not taking: Reported on 12/06/2020 11/12/20 01/24/21  Muthersbaugh, Alisa App, PA-C      Allergies    Iodinated contrast media, Lupine bean extract, Ceftriaxone , and Morphine     Review of Systems   Review of Systems  Respiratory:  Positive for shortness of breath.     Physical Exam Updated Vital Signs BP (!) 143/82   Pulse 99   Temp (!) 97.4 F (36.3 C)   Resp 20   Ht 5' (1.524 m)   Wt 78 kg   LMP 12/19/2023 (Approximate)   SpO2 100%   BMI 33.59 kg/m  Physical Exam Vitals and nursing note reviewed.  Constitutional:      General: She is not  in acute distress.    Appearance: She is well-developed.  HENT:     Head: Normocephalic and atraumatic.     Mouth/Throat:     Pharynx: No oropharyngeal exudate.  Eyes:     Conjunctiva/sclera: Conjunctivae normal.  Cardiovascular:     Rate and Rhythm: Normal rate and regular rhythm.     Heart sounds: No murmur heard. Pulmonary:     Effort: Pulmonary effort is normal. No respiratory distress.     Breath sounds: Normal breath sounds. No wheezing or rales.  Chest:     Chest wall: Tenderness present.  Abdominal:     Palpations: Abdomen is soft.     Tenderness: There is no abdominal tenderness.  Musculoskeletal:        General: No swelling.     Cervical back: Neck supple.  Skin:    General: Skin is warm and dry.     Capillary Refill: Capillary refill takes less than 2 seconds.  Neurological:     Mental Status: She is alert.  Psychiatric:        Mood and Affect: Mood normal.     ED Results / Procedures / Treatments   Labs (all labs ordered are listed, but only abnormal results are displayed) Labs Reviewed  BASIC METABOLIC PANEL WITH GFR - Abnormal; Notable for the following components:      Result Value   Potassium 3.2 (*)    CO2 18 (*)    Glucose, Bld 137 (*)    All other components within normal limits  RESP PANEL BY RT-PCR (RSV, FLU A&B, COVID)  RVPGX2  CBC  PREGNANCY, URINE  URINALYSIS, ROUTINE W REFLEX MICROSCOPIC  TROPONIN T, HIGH SENSITIVITY    EKG None  Radiology DG Chest 2 View Result Date: 01/09/2024 CLINICAL DATA:  Shortness of breath. EXAM: CHEST - 2 VIEW COMPARISON:  Chest radiograph dated 09/28/2023 FINDINGS: The heart size and mediastinal contours are within normal limits. Both lungs are clear. The visualized skeletal structures are unremarkable. IMPRESSION: No active cardiopulmonary disease. Electronically Signed   By: Angus Bark M.D.   On: 01/09/2024 16:40    Procedures Procedures    Medications Ordered in ED Medications  albuterol   (VENTOLIN  HFA) 108 (90 Base) MCG/ACT inhaler 2 puff (2 puffs Inhalation Given 01/09/24 1626)  predniSONE  (DELTASONE ) tablet 60 mg (60 mg Oral Given 01/09/24 1726)    ED Course/ Medical Decision Making/ A&P                                 Medical Decision Making Amount and/or Complexity of Data Reviewed Labs: ordered. Radiology: ordered.   This patient presents to  the ED with chief complaint(s) of shortness of breath.  The complaint involves an extensive differential diagnosis and also carries with it a high risk of complications and morbidity.   Pertinent past medical history as listed in HPI  The differential diagnosis includes  ACS, PE, asthma, URI, pneumonia, UTI, pyelonephritis Additional history obtained: Records reviewed Care Everywhere/External Records  Assessment and management:   Patient presents tachycardic and tachypneic with complaints of chest pain and shortness of breath that started earlier this morning.  She does have a history of inhaler and states that she lost it today.  She has no cardiac history or prior blood clots.  No risk factors either.  Reassuringly she has reproducible chest tenderness on exam.  Her lung sounds are clear without any wheezing or rales.  She is coughing during exam.  Respiratory panel is pending.  UA without any evidence of UTI.  Will provide inhaler for her asthma.  She is no longer tachycardic.  EKG without ischemic changes.  Troponin without elevation.  Discharged home with PCP follow-up.  Independent ECG interpretation:  Sinus tachycardia, low voltage precordial leads, borderline T wave abnormalities  Independent labs interpretation:  The following labs were independently interpreted:  BMP with mild hypokalemia 3.2, CBC unremarkable, troponin without elevation HCG negative, respiratory panel, UA unremarkable  Independent visualization and interpretation of imaging: I independently visualized the following imaging with scope of  interpretation limited to determining acute life threatening conditions related to emergency care: Chest x-ray without any acute cardiopulmonary disease   Consultations obtained:   none  Disposition:   Patient will be discharged home. The patient has been appropriately medically screened and/or stabilized in the ED. I have low suspicion for any other emergent medical condition which would require further screening, evaluation or treatment in the ED or require inpatient management. At time of discharge the patient is hemodynamically stable and in no acute distress. I have discussed work-up results and diagnosis with patient and answered all questions. Patient is agreeable with discharge plan. We discussed strict return precautions for returning to the emergency department and they verbalized understanding.     Social Determinants of Health:   Patient's impaired access to primary care  increases the complexity of managing their presentation  This note was dictated with voice recognition software.  Despite best efforts at proofreading, errors may have occurred which can change the documentation meaning.          Final Clinical Impression(s) / ED Diagnoses Final diagnoses:  SOB (shortness of breath)    Rx / DC Orders ED Discharge Orders     None         Felicie Horning, PA-C 01/09/24 1757    Felicie Horning, PA-C 01/09/24 1804    Lowery Rue, DO 01/09/24 1829

## 2024-01-14 ENCOUNTER — Emergency Department (HOSPITAL_COMMUNITY): Payer: Self-pay

## 2024-01-14 ENCOUNTER — Emergency Department (HOSPITAL_COMMUNITY)
Admission: EM | Admit: 2024-01-14 | Discharge: 2024-01-15 | Disposition: A | Discharge status: Home / Self Care | Payer: Self-pay | Attending: Emergency Medicine | Admitting: Emergency Medicine

## 2024-01-14 ENCOUNTER — Encounter (HOSPITAL_COMMUNITY): Payer: Self-pay

## 2024-01-14 ENCOUNTER — Other Ambulatory Visit: Payer: Self-pay

## 2024-01-14 DIAGNOSIS — J45909 Unspecified asthma, uncomplicated: Secondary | ICD-10-CM | POA: Insufficient documentation

## 2024-01-14 DIAGNOSIS — J069 Acute upper respiratory infection, unspecified: Secondary | ICD-10-CM | POA: Insufficient documentation

## 2024-01-14 LAB — RESP PANEL BY RT-PCR (RSV, FLU A&B, COVID)  RVPGX2
Influenza A by PCR: NEGATIVE
Influenza B by PCR: NEGATIVE
Resp Syncytial Virus by PCR: NEGATIVE
SARS Coronavirus 2 by RT PCR: NEGATIVE

## 2024-01-14 MED ORDER — PREDNISONE 20 MG PO TABS
60.0000 mg | ORAL_TABLET | Freq: Once | ORAL | Status: AC
  Administered 2024-01-14: 60 mg via ORAL
  Filled 2024-01-14: qty 3

## 2024-01-14 MED ORDER — BENZONATATE 100 MG PO CAPS
100.0000 mg | ORAL_CAPSULE | Freq: Once | ORAL | Status: AC
  Administered 2024-01-14: 100 mg via ORAL
  Filled 2024-01-14: qty 1

## 2024-01-14 MED ORDER — BENZONATATE 100 MG PO CAPS: 100.0000 mg | ORAL_CAPSULE | Freq: Three times a day (TID) | ORAL | 0 refills | Status: DC

## 2024-01-14 MED ORDER — AZITHROMYCIN 250 MG PO TABS: 250.0000 mg | ORAL_TABLET | Freq: Every day | ORAL | 0 refills | Status: DC

## 2024-01-14 MED ORDER — AZITHROMYCIN 250 MG PO TABS
500.0000 mg | ORAL_TABLET | Freq: Once | ORAL | Status: AC
  Administered 2024-01-14: 500 mg via ORAL
  Filled 2024-01-14: qty 2

## 2024-01-14 MED ORDER — PREDNISONE 50 MG PO TABS: 50.0000 mg | ORAL_TABLET | Freq: Every day | ORAL | 0 refills | Status: DC

## 2024-01-14 NOTE — ED Provider Notes (Signed)
 [place] EMERGENCY DEPARTMENT AT [facility] Provider Note   CSN: [id number] Arrival date & time: 01/14/24  2125     History Chief Complaint  Patient presents with   Cough    HPI Lorraine [name] is a 38 y.o. female presenting for chief complaint of cough. History of asthma. Has been sick for nearly 10 days.   Patient's recorded medical, surgical, social, medication list and allergies were reviewed in the Snapshot window as part of the initial history.   Review of Systems   Review of Systems  Constitutional:  Negative for chills and fever.  HENT:  Positive for congestion and sore throat. Negative for ear pain.   Eyes:  Negative for pain and visual disturbance.  Respiratory:  Positive for cough. Negative for choking and shortness of breath.   Cardiovascular:  Negative for chest pain and palpitations.  Gastrointestinal:  Negative for abdominal pain and vomiting.  Genitourinary:  Negative for dysuria and hematuria.  Musculoskeletal:  Negative for arthralgias and back pain.  Skin:  Negative for color change and rash.  Neurological:  Negative for seizures and syncope.  All other systems reviewed and are negative.   Physical Exam Updated Vital Signs BP (!) 148/84   Pulse 91   Temp 98.3 F (36.8 C) (Oral)   Resp 20   Ht 5' (1.524 m)   Wt 78 kg   LMP 12/19/2023 (Approximate)   SpO2 100%   BMI 33.59 kg/m  Physical Exam Vitals and nursing note reviewed.  Constitutional:      General: She is not in acute distress.    Appearance: She is well-developed.  HENT:     Head: Normocephalic and atraumatic.  Eyes:     Conjunctiva/sclera: Conjunctivae normal.  Cardiovascular:     Rate and Rhythm: Normal rate and regular rhythm.     Heart sounds: No murmur heard. Pulmonary:     Effort: Pulmonary effort is normal. No respiratory distress.     Breath sounds: Normal breath sounds.  Abdominal:     General: There is no distension.     Palpations: Abdomen is soft.      Tenderness: There is no abdominal tenderness. There is no right CVA tenderness or left CVA tenderness.  Musculoskeletal:        General: No swelling or tenderness. Normal range of motion.     Cervical back: Neck supple.  Skin:    General: Skin is warm and dry.  Neurological:     General: No focal deficit present.     Mental Status: She is alert and oriented to person, place, and time. Mental status is at baseline.     Cranial Nerves: No cranial nerve deficit.      ED Course/ Medical Decision Making/ A&P    Procedures Procedures   Medications Ordered in ED Medications  predniSONE  (DELTASONE ) tablet 60 mg (has no administration in time range)  azithromycin  (ZITHROMAX ) tablet 500 mg (has no administration in time range)  benzonatate  (TESSALON ) capsule 100 mg (has no administration in time range)   Medical Decision Making:   Lorraine [name] is a 38 y.o. female who presented to the ED today with subjective fever, cough, congestion detailed above.    Patient placed on continuous vitals and telemetry monitoring while in ED which was reviewed periodically.   Complete initial physical exam performed, notably the patient  was hemodynamically stable in no acute distress.  Posterior oropharynx illuminated and without obvious swelling or deformity.  Patient is without neck  stiffness.    Reviewed and confirmed nursing documentation for past medical history, family history, social history.    Initial Assessment:   With the patient's presentation of fever cough congestion, most likely diagnosis is developing viral upper respiratory infection. Other diagnoses were considered including (but not limited to) peritonsillar abscess, retropharyngeal abscess, pneumonia. These are considered less likely due to history of present illness and physical exam findings.   This is most consistent with an acute complicated illness Considered meningitis, however patient's symptoms, vital signs, physical  exam findings including lack of meningismus seem grossly less consistent at this time. Initial Plan:  Viral screening including COVID/flu testing to evaluate for common viral etiologies that need to be tracked CXR to evaluate for structural/infectious intrathoracic pathology.  Empiric treatment with antipyretics including acetaminophen  in ambulatory setting Objective evaluation as below reviewed   Initial Study Results:   Radiology:  All images reviewed independently. Agree with radiology report at this time.   DG Chest Portable 1 View  Final Result         Final Assessment and Plan:   On reassessment, patient is ambulatory tolerating p.o. intake in no acute distress.   Patient's COVID test is negative. Given duration and severity, will treat as asthma exacerbation vs infectious bronchitis with steroids and azithromycin . Patient is currently stable for outpatient care and management with no indication for hospitalization or transfer at this time.  Discussed all findings with patient expressed understanding.  Disposition:  Based on the above findings, I believe patient is stable for discharge.    Patient/family educated about specific return precautions for given chief complaint and symptoms.  Patient/family educated about follow-up with PCP.     Patient/family expressed understanding of return precautions and need for follow-up. Patient spoken to regarding all imaging and laboratory results and appropriate follow up for these results. All education provided in verbal form with additional information in written form. Time was allowed for answering of patient questions. Patient discharged.    Emergency Department Medication Summary:   Medications  predniSONE  (DELTASONE ) tablet 60 mg (has no administration in time range)  azithromycin  (ZITHROMAX ) tablet 500 mg (has no administration in time range)  benzonatate  (TESSALON ) capsule 100 mg (has no administration in time range)            Clinical Impression:  1. Upper respiratory tract infection, unspecified type      Discharge   Clinical Impression:  1. Upper respiratory tract infection, unspecified type      Discharge   Final Clinical Impression(s) / ED Diagnoses Final diagnoses:  Upper respiratory tract infection, unspecified type    Rx / DC Orders ED Discharge Orders          Ordered    azithromycin  (ZITHROMAX ) 250 MG tablet  Daily        01/14/24 2351    benzonatate  (TESSALON ) 100 MG capsule  Every 8 hours        01/14/24 2351    predniSONE  (DELTASONE ) 50 MG tablet  Daily        01/14/24 2351              Onetha Bile, MD 01/14/24 2352

## 2024-01-14 NOTE — ED Triage Notes (Signed)
 Cough for 5 days, reports chest hurts when coughing. C/o sore throat with redness and itching.

## 2024-02-11 ENCOUNTER — Emergency Department (HOSPITAL_BASED_OUTPATIENT_CLINIC_OR_DEPARTMENT_OTHER)
Admission: EM | Admit: 2024-02-11 | Discharge: 2024-02-11 | Disposition: A | Discharge status: Home / Self Care | Payer: Self-pay | Attending: Emergency Medicine | Admitting: Emergency Medicine

## 2024-02-11 ENCOUNTER — Other Ambulatory Visit: Payer: Self-pay

## 2024-02-11 ENCOUNTER — Emergency Department (HOSPITAL_BASED_OUTPATIENT_CLINIC_OR_DEPARTMENT_OTHER): Payer: Self-pay

## 2024-02-11 ENCOUNTER — Encounter (HOSPITAL_BASED_OUTPATIENT_CLINIC_OR_DEPARTMENT_OTHER): Payer: Self-pay

## 2024-02-11 DIAGNOSIS — R1084 Generalized abdominal pain: Secondary | ICD-10-CM

## 2024-02-11 DIAGNOSIS — R319 Hematuria, unspecified: Secondary | ICD-10-CM

## 2024-02-11 LAB — CBC WITH DIFFERENTIAL/PLATELET
Abs Immature Granulocytes: 0.04 10*3/uL (ref 0.00–0.07)
Basophils Absolute: 0.1 10*3/uL (ref 0.0–0.1)
Basophils Relative: 1 %
Eosinophils Absolute: 0.3 10*3/uL (ref 0.0–0.5)
Eosinophils Relative: 4 %
HCT: 36.4 % (ref 36.0–46.0)
Hemoglobin: 12.4 g/dL (ref 12.0–15.0)
Immature Granulocytes: 1 %
Lymphocytes Relative: 38 %
Lymphs Abs: 3 10*3/uL (ref 0.7–4.0)
MCH: 30.1 pg (ref 26.0–34.0)
MCHC: 34.1 g/dL (ref 30.0–36.0)
MCV: 88.3 fL (ref 80.0–100.0)
Monocytes Absolute: 0.5 10*3/uL (ref 0.1–1.0)
Monocytes Relative: 7 %
Neutro Abs: 4 10*3/uL (ref 1.7–7.7)
Neutrophils Relative %: 49 %
Platelets: 288 10*3/uL (ref 150–400)
RBC: 4.12 MIL/uL (ref 3.87–5.11)
RDW: 12.7 % (ref 11.5–15.5)
WBC: 7.9 10*3/uL (ref 4.0–10.5)
nRBC: 0 % (ref 0.0–0.2)

## 2024-02-11 LAB — URINALYSIS, MICROSCOPIC (REFLEX): RBC / HPF: >50 RBC/hpf (ref 0–5)

## 2024-02-11 LAB — COMPREHENSIVE METABOLIC PANEL WITH GFR
ALT: 27 U/L (ref 0–44)
AST: 38 U/L (ref 15–41)
Albumin: 4 g/dL (ref 3.5–5.0)
Alkaline Phosphatase: 132 U/L — ABNORMAL HIGH (ref 38–126)
Anion gap: 14 (ref 5–15)
BUN: 18 mg/dL (ref 6–20)
CO2: 21 mmol/L — ABNORMAL LOW (ref 22–32)
Calcium: 8.9 mg/dL (ref 8.9–10.3)
Chloride: 104 mmol/L (ref 98–111)
Creatinine, Ser: 0.8 mg/dL (ref 0.44–1.00)
GFR, Estimated: >60 mL/min (ref >60)
Glucose, Bld: 99 mg/dL (ref 70–99)
Sodium: 139 mmol/L (ref 135–145)
Total Bilirubin: <0.2 mg/dL (ref 0.0–1.2)
Total Protein: 6.9 g/dL (ref 6.5–8.1)

## 2024-02-11 LAB — URINALYSIS, ROUTINE W REFLEX MICROSCOPIC

## 2024-02-11 LAB — PREGNANCY, URINE: Preg Test, Ur: NEGATIVE

## 2024-02-11 LAB — LIPASE, BLOOD: Lipase: 25 U/L (ref 11–51)

## 2024-02-11 MED ORDER — NITROFURANTOIN MONOHYD MACRO 100 MG PO CAPS: 100.0000 mg | ORAL_CAPSULE | Freq: Two times a day (BID) | ORAL | 0 refills | Status: DC

## 2024-02-11 MED ORDER — FENTANYL CITRATE PF 50 MCG/ML IJ SOSY
50.0000 ug | PREFILLED_SYRINGE | Freq: Once | INTRAMUSCULAR | Status: AC
  Administered 2024-02-11: 50 ug via INTRAVENOUS
  Filled 2024-02-11: qty 1

## 2024-02-11 MED ORDER — ONDANSETRON HCL 4 MG/2ML IJ SOLN
4.0000 mg | Freq: Once | INTRAMUSCULAR | Status: AC
Start: 2024-02-11 — End: 2024-02-11
  Administered 2024-02-11: 4 mg via INTRAVENOUS
  Filled 2024-02-11: qty 2

## 2024-02-11 MED ORDER — NITROFURANTOIN MONOHYD MACRO 100 MG PO CAPS
100.0000 mg | ORAL_CAPSULE | Freq: Once | ORAL | Status: AC
  Administered 2024-02-11: 100 mg via ORAL
  Filled 2024-02-11: qty 1

## 2024-02-11 MED ORDER — SODIUM CHLORIDE 0.9 % IV BOLUS
1000.0000 mL | Freq: Once | INTRAVENOUS | Status: AC
  Administered 2024-02-11: 1000 mL via INTRAVENOUS

## 2024-02-11 MED ORDER — ONDANSETRON HCL 4 MG/2ML IJ SOLN
4.0000 mg | Freq: Once | INTRAMUSCULAR | Status: AC
  Administered 2024-02-11: 4 mg via INTRAVENOUS
  Filled 2024-02-11: qty 2

## 2024-02-11 MED ORDER — ONDANSETRON 4 MG PO TBDP: 4.0000 mg | ORAL_TABLET | Freq: Three times a day (TID) | ORAL | 0 refills | Status: DC | PRN

## 2024-02-11 MED ORDER — IBUPROFEN 800 MG PO TABS: 800.0000 mg | ORAL_TABLET | Freq: Three times a day (TID) | ORAL | 0 refills | Status: DC | PRN

## 2024-02-11 NOTE — ED Provider Notes (Signed)
 [facility] Provider Note   CSN: [id number] Arrival date & time: 02/11/24  0002     History  Chief Complaint  Patient presents with   Abdominal Pain    Lorraine [name] is a 38 y.o. female.  Level 5 caveat for language barrier.  Translator used.  Patient here with 1 day of right mid abdominal pain that radiates to her right lower quadrant periumbilical area.  Pain is constant.  Nothing makes it better or worse.  Worse with palpation and movement.  Some nausea without vomiting.  Some pain with urination but no blood in the urine.  No vaginal bleeding or discharge.  Does not believe she is pregnant.  Previous cholecystectomy.  Has not had this kind of pain in the past.  Has had a kidney stone before but this does not feel similar.  No diarrhea.  No fever.  No abnormal vaginal bleeding or discharge.  Pain is constant and worse with walking and palpation.  Does have a history of ovarian cyst as well as kidney stone.  The history is provided by the patient.  Abdominal Pain Associated symptoms: dysuria and nausea   Associated symptoms: no cough, no fever, no hematuria, no shortness of breath and no vomiting        Home Medications Prior to Admission medications   Medication Sig Start Date End Date Taking? Authorizing Provider  acetaminophen  (TYLENOL ) 500 MG tablet Take 1 tablet (500 mg total) by mouth every 6 (six) hours as needed. Patient not taking: Reported on 01/14/2024 09/13/23   Debbra Fairy, PA-C  albuterol  (VENTOLIN  HFA) 108 [phone] Base) MCG/ACT inhaler Inhale 2 puffs into the lungs every 4 (four) hours as needed for wheezing or shortness of breath. 08/25/22   Cheyenne Cotta, MD  azithromycin  (ZITHROMAX ) 250 MG tablet Take 1 tablet (250 mg total) by mouth daily. Take first 2 tablets together, then 1 every day until finished. 01/14/24   Onetha Bile, MD  benzonatate  (TESSALON ) 100 MG capsule Take 1 capsule (100 mg total) by mouth every 8  (eight) hours. 01/14/24   Onetha Bile, MD  dicyclomine  (BENTYL ) 20 MG tablet Take 1 tablet (20 mg total) by mouth 2 (two) times daily. Patient not taking: Reported on 01/14/2024 08/20/23   Kingsley, Victoria K, DO  HYDROcodone -acetaminophen  (NORCO/VICODIN) 5-325 MG tablet Take 2 tablets by mouth every 6 (six) hours as needed for severe pain (pain score 7-10). Patient not taking: Reported on 01/14/2024 10/19/23   Kommor, Alyse July, MD  ketorolac  (TORADOL ) 10 MG tablet Take 1 tablet (10 mg total) by mouth every 6 (six) hours as needed. Patient not taking: Reported on 01/14/2024 09/19/23   [place] Butter, PA  meclizine  (ANTIVERT ) 25 MG tablet Take 1 tablet (25 mg total) by mouth 3 (three) times daily as needed for dizziness. Patient not taking: Reported on 01/14/2024 09/19/23   Neil Balls A, PA  metoCLOPramide  (REGLAN ) 10 MG tablet Take 1 tablet (10 mg total) by mouth every 6 (six) hours. Patient not taking: Reported on 01/14/2024 08/19/23   Afton Horse T, DO  omeprazole  (PRILOSEC) 20 MG capsule Take 1 capsule (20 mg total) by mouth daily. 08/19/23 09/18/23  Afton Horse T, DO  ondansetron  (ZOFRAN -ODT) 4 MG disintegrating tablet Take 1 tablet (4 mg total) by mouth every 8 (eight) hours as needed. Patient not taking: Reported on 01/14/2024 09/19/23   Neil Balls A, PA  oseltamivir  (TAMIFLU ) 75 MG capsule Take 1 capsule (75 mg total) by mouth  every 12 (twelve) hours. Patient not taking: Reported on 01/14/2024 09/17/23   Rexie Catena, PA-C  pantoprazole  (PROTONIX ) 20 MG tablet Take 1 tablet (20 mg total) by mouth daily for 14 days. 07/25/23 08/08/23  Rolinda Climes, DO  predniSONE  (DELTASONE ) 50 MG tablet Take 1 tablet (50 mg total) by mouth daily. 01/14/24   Onetha Bile, MD  promethazine  (PHENERGAN ) 25 MG suppository Place 1 suppository (25 mg total) rectally every 6 (six) hours as needed for nausea or vomiting. Patient not taking: Reported on 01/14/2024 09/19/23   Neil Balls A, PA   sucralfate  (CARAFATE ) 1 GM/10ML suspension Take 10 mLs (1 g total) by mouth 4 (four) times daily -  with meals and at bedtime. Patient not taking: Reported on 01/14/2024 08/02/23   Lindle Rhea, MD  cetirizine  (ZYRTEC  ALLERGY) 10 MG tablet Take 1 tablet (10 mg total) by mouth 2 (two) times daily. Patient not taking: Reported on 12/06/2020 11/12/20 01/24/21  Muthersbaugh, Alisa App, PA-C      Allergies    Iodinated contrast media, Lupine bean extract, Ceftriaxone , and Morphine     Review of Systems   Review of Systems  Constitutional:  Positive for activity change and appetite change. Negative for fever.  HENT:  Negative for congestion and rhinorrhea.   Respiratory:  Negative for cough, chest tightness and shortness of breath.   Gastrointestinal:  Positive for abdominal pain and nausea. Negative for vomiting.  Genitourinary:  Positive for dysuria. Negative for hematuria.  Musculoskeletal:  Positive for back pain. Negative for arthralgias and joint swelling.  Skin:  Negative for rash.  Neurological:  Negative for dizziness, weakness and headaches.   all other systems are negative except as noted in the HPI and PMH.    Physical Exam Updated Vital Signs BP (!) 133/103 (BP Location: Left Arm)   Pulse 80   Temp 97.7 F (36.5 C) (Oral)   Resp 16   Ht 5' (1.524 m)   Wt 78.5 kg   LMP 02/11/2024 (Exact Date)   SpO2 100%   BMI 33.79 kg/m  Physical Exam Vitals and nursing note reviewed.  Constitutional:      General: She is not in acute distress.    Appearance: She is well-developed.  HENT:     Head: Normocephalic and atraumatic.     Mouth/Throat:     Pharynx: No oropharyngeal exudate.  Eyes:     Conjunctiva/sclera: Conjunctivae normal.     Pupils: Pupils are equal, round, and reactive to light.  Neck:     Comments: No meningismus. Cardiovascular:     Rate and Rhythm: Normal rate and regular rhythm.     Heart sounds: Normal heart sounds. No murmur heard. Pulmonary:      Effort: Pulmonary effort is normal. No respiratory distress.     Breath sounds: Normal breath sounds.  Abdominal:     Palpations: Abdomen is soft.     Tenderness: There is abdominal tenderness. There is guarding. There is no rebound.     Comments: Tender right lower quadrant periumbilical area voluntary guarding.  No rebound.  Musculoskeletal:        General: No tenderness. Normal range of motion.     Cervical back: Normal range of motion and neck supple.     Comments: No CVA tenderness  Skin:    General: Skin is warm.  Neurological:     Mental Status: She is alert and oriented to person, place, and time.     Cranial Nerves: No cranial nerve deficit.  Motor: No abnormal muscle tone.     Coordination: Coordination normal.     Comments:  5/5 strength throughout. CN 2-12 intact.Equal grip strength.   Psychiatric:        Behavior: Behavior normal.     ED Results / Procedures / Treatments   Labs (all labs ordered are listed, but only abnormal results are displayed) Labs Reviewed  URINALYSIS, ROUTINE W REFLEX MICROSCOPIC - Abnormal; Notable for the following components:      Result Value   Color, Urine RED (*)    APPearance TURBID (*)    Glucose, UA   (*)    Value: TEST NOT REPORTED DUE TO COLOR INTERFERENCE OF URINE PIGMENT   Hgb urine dipstick   (*)    Value: TEST NOT REPORTED DUE TO COLOR INTERFERENCE OF URINE PIGMENT   Bilirubin Urine   (*)    Value: TEST NOT REPORTED DUE TO COLOR INTERFERENCE OF URINE PIGMENT   Ketones, ur   (*)    Value: TEST NOT REPORTED DUE TO COLOR INTERFERENCE OF URINE PIGMENT   Protein, ur   (*)    Value: TEST NOT REPORTED DUE TO COLOR INTERFERENCE OF URINE PIGMENT   Nitrite   (*)    Value: TEST NOT REPORTED DUE TO COLOR INTERFERENCE OF URINE PIGMENT   Leukocytes,Ua   (*)    Value: TEST NOT REPORTED DUE TO COLOR INTERFERENCE OF URINE PIGMENT   All other components within normal limits  COMPREHENSIVE METABOLIC PANEL WITH GFR - Abnormal; Notable  for the following components:   CO2 21 (*)    Alkaline Phosphatase 132 (*)    All other components within normal limits  URINALYSIS, MICROSCOPIC (REFLEX) - Abnormal; Notable for the following components:   Bacteria, UA RARE (*)    All other components within normal limits  URINE CULTURE  PREGNANCY, URINE  CBC WITH DIFFERENTIAL/PLATELET  LIPASE, BLOOD  CBC WITH DIFFERENTIAL/PLATELET    EKG None  Radiology CT Renal Stone Study Result Date: 02/11/2024 CLINICAL DATA:  Right lower quadrant pain. EXAM: CT ABDOMEN AND PELVIS WITHOUT CONTRAST TECHNIQUE: Multidetector CT imaging of the abdomen and pelvis was performed following the standard protocol without IV contrast. RADIATION DOSE REDUCTION: This exam was performed according to the departmental dose-optimization program which includes automated exposure control, adjustment of the mA and/or kV according to patient size and/or use of iterative reconstruction technique. COMPARISON:  June 03, 2023 FINDINGS: Lower chest: No acute abnormality. Hepatobiliary: No focal liver abnormality is seen. Status post cholecystectomy. No biliary dilatation. Pancreas: Unremarkable. No pancreatic ductal dilatation or surrounding inflammatory changes. Spleen: Normal in size without focal abnormality. Adrenals/Urinary Tract: Adrenal glands are unremarkable. Kidneys are normal in size, without obstructing renal calculi, focal lesion, or hydronephrosis. A punctate nonobstructing renal calculus is seen within the mid right kidney (axial CT image 33, CT series 2). Tubal ligation clips are seen adjacent to the dome of an otherwise normal appearing urinary bladder. Stomach/Bowel: Stomach is within normal limits. Appendix appears normal. No evidence of bowel wall thickening, distention, or inflammatory changes. Noninflamed diverticula are seen throughout the large bowel. Vascular/Lymphatic: No significant vascular findings are present. No enlarged abdominal or pelvic lymph  nodes. Reproductive: Uterus and bilateral adnexa are unremarkable. Other: No abdominal wall hernia or abnormality. No abdominopelvic ascites. Musculoskeletal: No acute or significant osseous findings. IMPRESSION: 1. Punctate nonobstructing right renal calculus. 2. Colonic diverticulosis. 3. Evidence of prior cholecystectomy. Electronically Signed   By: Virgle Grime M.D.   On: 02/11/2024 03:18  US  PELVIC COMPLETE W TRANSVAGINAL AND TORSION R/O Result Date: 02/11/2024 CLINICAL DATA:  Right lower quadrant pain EXAM: TRANSABDOMINAL AND TRANSVAGINAL ULTRASOUND OF PELVIS DOPPLER ULTRASOUND OF OVARIES TECHNIQUE: Both transabdominal and transvaginal ultrasound examinations of the pelvis were performed. Transabdominal technique was performed for global imaging of the pelvis including uterus, ovaries, adnexal regions, and pelvic cul-de-sac. Color and duplex Doppler ultrasound was utilized to evaluate blood flow to the ovaries. COMPARISON:  CT abdomen pelvis 06/03/2023 FINDINGS: Uterus Measurements: 9.1 x 4.4 x 5.4 cm = volume: 111 mL. No fibroids or other mass visualized. C-section scar in the anterior lower uterine segment. Endometrium Thickness: 5 mm.  No focal abnormality visualized. Right ovary Measurements: 2.9 x 2.0 x 2.1 cm = volume: 6.3 mL. 1.3 cm corpus luteum cyst. No follow-up recommended. Left ovary Measurements: 2.9 x 2.0 x 1.4 cm = volume: 4.0 mL. Normal appearance/no adnexal mass. Pulsed Doppler evaluation of both ovaries demonstrates normal low-resistance arterial and venous waveforms. Other findings Trace free fluid in the pelvis. IMPRESSION: Unremarkable pelvic ultrasound.  No evidence of torsion. Electronically Signed   By: Rozell Cornet M.D.   On: 02/11/2024 02:14    Procedures Procedures    Medications Ordered in ED Medications - No data to display  ED Course/ Medical Decision Making/ A&P                                 Medical Decision Making Amount and/or Complexity of Data  Reviewed Labs: ordered. Decision-making details documented in ED Course. Radiology: ordered and independent interpretation performed. Decision-making details documented in ED Course. ECG/medicine tests: ordered and independent interpretation performed. Decision-making details documented in ED Course.  Risk Prescription drug management.   Right lower quadrant abdominal pain with nausea, dysuria, subjective fever.  Vital stable.  No distress.  Abdomen soft without peritoneal signs  hCG is negative.  Urinalysis is grossly bloody. Labs show no significant leukocytosis.  Pelvic ultrasound was negative for ovarian cyst or ovarian torsion.  Previous cholecystectomy.  CT scan is obtained and shows punctate right renal kidney stone.  Consider passed kidney stone given her hematuria. Diverticulosis without evidence of diverticulitis. Appendix is normal.  Much difficulty with lab studies including hemolysis of multiple samples.  Lab unable to give a potassium result but remainder of chemistry is normal with normal kidney function.  Low suspicion for hyperkalemia.  Will treat empirically for possible hemorrhagic cystitis with antibiotics with cultures pending.  Consider likely passed kidney stone.  Pain is improved and patient is tolerating p.o.  Will give antibiotics for possibility of hemorrhagic cystitis.  CT scan negative for acute pathology.  No evidence of ovarian torsion.  Discussed follow-up with PCP.  Return to the ED with worsening pain, fever, vomiting, unable to urinate or any concerns.       Final Clinical Impression(s) / ED Diagnoses Final diagnoses:  Generalized abdominal pain  Hematuria, unspecified type    Rx / DC Orders ED Discharge Orders     None         Izaiyah Kleinman, Mara [place], MD 02/11/24 [phone]

## 2024-02-11 NOTE — ED Notes (Signed)
 Abdominal pain x 1 day today becoming increasingly more painful.  +nausea

## 2024-02-11 NOTE — Progress Notes (Signed)
 Attempted ABG multiple times, RT unsuccessful.

## 2024-02-11 NOTE — Discharge Instructions (Signed)
 As we discussed you may have passed a kidney stone.  Take the antibiotic for possible urinary tract infection while the culture is pending.  Follow-up with your doctor.  Return to the ED with worsening pain, fever, vomiting, not able to urinate or other concerns

## 2024-02-11 NOTE — ED Triage Notes (Addendum)
 POV/ presents with RLQ ABD pain x 1 day/ radiates to back/ walking makes pain worse/ denies V/D/ mild nausea/ pain w/ urination/ fever @ home/ took pain meds but does not remember the name/ pt is A&Ox4/ ambulatory

## 2024-02-11 NOTE — ED Notes (Signed)
 Patient transported to Ultrasound

## 2024-02-12 LAB — URINE CULTURE

## 2024-02-19 ENCOUNTER — Emergency Department (HOSPITAL_BASED_OUTPATIENT_CLINIC_OR_DEPARTMENT_OTHER): Payer: Self-pay

## 2024-02-19 ENCOUNTER — Emergency Department (HOSPITAL_BASED_OUTPATIENT_CLINIC_OR_DEPARTMENT_OTHER)
Admission: EM | Admit: 2024-02-19 | Discharge: 2024-02-19 | Disposition: A | Discharge status: Home / Self Care | Payer: Self-pay | Attending: Emergency Medicine | Admitting: Emergency Medicine

## 2024-02-19 ENCOUNTER — Other Ambulatory Visit: Payer: Self-pay

## 2024-02-19 ENCOUNTER — Encounter (HOSPITAL_BASED_OUTPATIENT_CLINIC_OR_DEPARTMENT_OTHER): Payer: Self-pay

## 2024-02-19 DIAGNOSIS — R109 Unspecified abdominal pain: Secondary | ICD-10-CM

## 2024-02-19 DIAGNOSIS — M549 Dorsalgia, unspecified: Secondary | ICD-10-CM | POA: Insufficient documentation

## 2024-02-19 DIAGNOSIS — R1010 Upper abdominal pain, unspecified: Secondary | ICD-10-CM | POA: Insufficient documentation

## 2024-02-19 LAB — URINALYSIS, ROUTINE W REFLEX MICROSCOPIC
Bilirubin Urine: NEGATIVE
Glucose, UA: NEGATIVE mg/dL
Hgb urine dipstick: NEGATIVE
Ketones, ur: NEGATIVE mg/dL
Leukocytes,Ua: NEGATIVE
Nitrite: NEGATIVE
Protein, ur: 30 mg/dL — AB
Specific Gravity, Urine: >=1.03 (ref 1.005–1.030)
pH: 6 (ref 5.0–8.0)

## 2024-02-19 LAB — CBC WITH DIFFERENTIAL/PLATELET
Abs Immature Granulocytes: 0.03 10*3/uL (ref 0.00–0.07)
Basophils Absolute: 0 10*3/uL (ref 0.0–0.1)
Basophils Relative: 1 %
Eosinophils Absolute: 0.1 10*3/uL (ref 0.0–0.5)
Eosinophils Relative: 1 %
HCT: 34.7 % — ABNORMAL LOW (ref 36.0–46.0)
Hemoglobin: 11.5 g/dL — ABNORMAL LOW (ref 12.0–15.0)
Immature Granulocytes: 0 %
Lymphocytes Relative: 38 %
Lymphs Abs: 3.3 10*3/uL (ref 0.7–4.0)
MCH: 29.3 pg (ref 26.0–34.0)
MCHC: 33.1 g/dL (ref 30.0–36.0)
MCV: 88.5 fL (ref 80.0–100.0)
Monocytes Absolute: 0.6 10*3/uL (ref 0.1–1.0)
Monocytes Relative: 7 %
Neutro Abs: 4.6 10*3/uL (ref 1.7–7.7)
Neutrophils Relative %: 53 %
Platelets: 271 10*3/uL (ref 150–400)
RBC: 3.92 MIL/uL (ref 3.87–5.11)
RDW: 12.6 % (ref 11.5–15.5)
WBC: 8.7 10*3/uL (ref 4.0–10.5)
nRBC: 0 % (ref 0.0–0.2)

## 2024-02-19 LAB — COMPREHENSIVE METABOLIC PANEL WITH GFR
ALT: 31 U/L (ref 0–44)
AST: 32 U/L (ref 15–41)
Albumin: 4 g/dL (ref 3.5–5.0)
Alkaline Phosphatase: 116 U/L (ref 38–126)
Anion gap: 10 (ref 5–15)
BUN: 17 mg/dL (ref 6–20)
CO2: 25 mmol/L (ref 22–32)
Calcium: 8.6 mg/dL — ABNORMAL LOW (ref 8.9–10.3)
Chloride: 105 mmol/L (ref 98–111)
Creatinine, Ser: 0.58 mg/dL (ref 0.44–1.00)
GFR, Estimated: >60 mL/min (ref >60)
Glucose, Bld: 103 mg/dL — ABNORMAL HIGH (ref 70–99)
Potassium: 3.7 mmol/L (ref 3.5–5.1)
Sodium: 139 mmol/L (ref 135–145)
Total Bilirubin: 0.2 mg/dL (ref 0.0–1.2)
Total Protein: 6.6 g/dL (ref 6.5–8.1)

## 2024-02-19 LAB — URINALYSIS, MICROSCOPIC (REFLEX)

## 2024-02-19 LAB — LIPASE, BLOOD: Lipase: 28 U/L (ref 11–51)

## 2024-02-19 LAB — PREGNANCY, URINE: Preg Test, Ur: NEGATIVE

## 2024-02-19 MED ORDER — SODIUM CHLORIDE 0.9 % IV BOLUS
1000.0000 mL | Freq: Once | INTRAVENOUS | Status: AC
  Administered 2024-02-19: 1000 mL via INTRAVENOUS

## 2024-02-19 MED ORDER — HYDROMORPHONE HCL 1 MG/ML IJ SOLN
0.5000 mg | Freq: Once | INTRAMUSCULAR | Status: AC
  Administered 2024-02-19: 0.5 mg via INTRAVENOUS
  Filled 2024-02-19: qty 1

## 2024-02-19 MED ORDER — CIPROFLOXACIN HCL 500 MG PO TABS: 500.0000 mg | ORAL_TABLET | Freq: Two times a day (BID) | ORAL | 0 refills | Status: DC

## 2024-02-19 MED ORDER — PROCHLORPERAZINE EDISYLATE 10 MG/2ML IJ SOLN
5.0000 mg | Freq: Once | INTRAMUSCULAR | Status: AC
  Administered 2024-02-19: 5 mg via INTRAVENOUS
  Filled 2024-02-19: qty 2

## 2024-02-19 MED ORDER — ONDANSETRON HCL 4 MG PO TABS: 4.0000 mg | ORAL_TABLET | Freq: Four times a day (QID) | ORAL | 0 refills | Status: DC

## 2024-02-19 MED ORDER — OXYCODONE-ACETAMINOPHEN 5-325 MG PO TABS: 1.0000 | ORAL_TABLET | Freq: Four times a day (QID) | ORAL | 0 refills | Status: DC | PRN

## 2024-02-19 MED ORDER — ONDANSETRON HCL 4 MG/2ML IJ SOLN
4.0000 mg | Freq: Once | INTRAMUSCULAR | Status: AC
  Administered 2024-02-19: 4 mg via INTRAVENOUS
  Filled 2024-02-19: qty 2

## 2024-02-19 MED ORDER — SODIUM CHLORIDE 0.9 % IV SOLN: INTRAVENOUS | Status: DC

## 2024-02-19 MED ORDER — METRONIDAZOLE 500 MG PO TABS: 500.0000 mg | ORAL_TABLET | Freq: Two times a day (BID) | ORAL | 0 refills | Status: DC

## 2024-02-19 NOTE — ED Notes (Signed)
 Lorraine [name] [id number] utilized for DC.

## 2024-02-19 NOTE — ED Triage Notes (Signed)
 Pt was seen last week for back pain and vomiting. Was taking medication that improved symptoms, however symptoms returned yesterday and now having pain with urination. C/o lower back pain radiating to abdomen.  Spanish interpreter used during triage.

## 2024-02-19 NOTE — ED Notes (Signed)
 Marva attempted 2x IV's without success. Will attempt now.

## 2024-02-19 NOTE — ED Notes (Signed)
 Discharge paperwork reviewed entirely with patient, including follow up care. Pain was under control. The patient received instruction and coaching on their prescriptions, and all follow-up questions were answered.  Pt verbalized understanding as well as all parties involved. No questions or concerns voiced at the time of discharge. No acute distress noted. Pt was encouraged to stay adequately hydrated and eat a healthy diet.   Pt ambulated out to PVA without incident or assistance.  Pt advised they will seek followup care with a specialist and followup with their PCP.  and Pt was given information to obtain and notify a PCP.   The pt was instructed to set up and/or review MyChart for their results; and was informed their Providers all have access to the information as well.

## 2024-02-19 NOTE — ED Provider Notes (Addendum)
 [place] [facility] Provider Note   CSN: [id number] Arrival date & time: 02/19/24  4098     History  Chief Complaint  Patient presents with   Emesis   Abdominal Pain    Lorraine [name] is a 38 y.o. female presents to the ED today due to back pain, upper abdominal pain, and bilateral flank pain.  She was initially seen at this ED on 27 May for similar symptoms, treated for cystitis with Macrobid , initially noted improvement however states that over the last 24 to 36 hours she has had increasing abdominal pain primarily upper abdominal pain.  She further endorses hematochezia.  It is noted that she has a previous medical history of diverticulitis, has previously been managed for acute pancreatitis, has a previous medical history of GERD.  She endorses nausea, decreased appetite, decreased p.o. fluid intake.  Further she states that when she attempted to eat yesterday morning she became nauseous and vomited her food.  She further endorses dizziness, particularly with standing.    HPI     Home Medications Prior to Admission medications   Medication Sig Start Date End Date Taking? Authorizing Provider  acetaminophen  (TYLENOL ) 500 MG tablet Take 1 tablet (500 mg total) by mouth every 6 (six) hours as needed. Patient not taking: Reported on 01/14/2024 09/13/23   Debbra Fairy, PA-C  albuterol  (VENTOLIN  HFA) 108 [phone] Base) MCG/ACT inhaler Inhale 2 puffs into the lungs every 4 (four) hours as needed for wheezing or shortness of breath. 08/25/22   Cheyenne Cotta, MD  azithromycin  (ZITHROMAX ) 250 MG tablet Take 1 tablet (250 mg total) by mouth daily. Take first 2 tablets together, then 1 every day until finished. Patient not taking: Reported on 02/19/2024 01/14/24   Onetha Bile, MD  benzonatate  (TESSALON ) 100 MG capsule Take 1 capsule (100 mg total) by mouth every 8 (eight) hours. Patient not taking: Reported on 02/19/2024 01/14/24   Onetha Bile, MD   dicyclomine  (BENTYL ) 20 MG tablet Take 1 tablet (20 mg total) by mouth 2 (two) times daily. Patient not taking: Reported on 01/14/2024 08/20/23   Kingsley, Victoria K, DO  HYDROcodone -acetaminophen  (NORCO/VICODIN) 5-325 MG tablet Take 2 tablets by mouth every 6 (six) hours as needed for severe pain (pain score 7-10). Patient not taking: Reported on 01/14/2024 10/19/23   Kommor, Alyse July, MD  ibuprofen  (ADVIL ) 800 MG tablet Take 1 tablet (800 mg total) by mouth every 8 (eight) hours as needed for moderate pain (pain score 4-6). 02/11/24   Rancour, Mara Seminole, MD  meclizine  (ANTIVERT ) 25 MG tablet Take 1 tablet (25 mg total) by mouth 3 (three) times daily as needed for dizziness. Patient not taking: Reported on 01/14/2024 09/19/23   Neil Balls A, PA  metoCLOPramide  (REGLAN ) 10 MG tablet Take 1 tablet (10 mg total) by mouth every 6 (six) hours. Patient not taking: Reported on 01/14/2024 08/19/23   Afton Horse T, DO  nitrofurantoin , macrocrystal-monohydrate, (MACROBID ) 100 MG capsule Take 1 capsule (100 mg total) by mouth 2 (two) times daily. 02/11/24   Rancour, Mara Seminole, MD  omeprazole  (PRILOSEC) 20 MG capsule Take 1 capsule (20 mg total) by mouth daily. 08/19/23 09/18/23  Afton Horse T, DO  ondansetron  (ZOFRAN -ODT) 4 MG disintegrating tablet Take 1 tablet (4 mg total) by mouth every 8 (eight) hours as needed for nausea or vomiting. 02/11/24   Rancour, Mara Seminole, MD  oseltamivir  (TAMIFLU ) 75 MG capsule Take 1 capsule (75 mg total) by mouth every 12 (twelve) hours. Patient not taking: Reported  on 01/14/2024 09/17/23   Rexie Catena, PA-C  pantoprazole  (PROTONIX ) 20 MG tablet Take 1 tablet (20 mg total) by mouth daily for 14 days. 07/25/23 08/08/23  Rolinda Climes, DO  predniSONE  (DELTASONE ) 50 MG tablet Take 1 tablet (50 mg total) by mouth daily. 01/14/24   Onetha Bile, MD  promethazine  (PHENERGAN ) 25 MG suppository Place 1 suppository (25 mg total) rectally every 6 (six) hours as needed for nausea or  vomiting. Patient not taking: Reported on 01/14/2024 09/19/23   Lyerly Butter, PA  sucralfate  (CARAFATE ) 1 GM/10ML suspension Take 10 mLs (1 g total) by mouth 4 (four) times daily -  with meals and at bedtime. Patient not taking: Reported on 01/14/2024 08/02/23   Lindle Rhea, MD  cetirizine  (ZYRTEC  ALLERGY) 10 MG tablet Take 1 tablet (10 mg total) by mouth 2 (two) times daily. Patient not taking: Reported on 12/06/2020 11/12/20 01/24/21  Muthersbaugh, Alisa App, PA-C      Allergies    Iodinated contrast media, Lupine bean extract, Ceftriaxone , and Morphine     Review of Systems   Review of Systems  Constitutional:  Positive for activity change, appetite change, chills and fatigue.  Respiratory:  Negative for shortness of breath.   Cardiovascular:  Negative for chest pain and leg swelling.  Gastrointestinal:  Positive for abdominal pain, blood in stool, nausea and vomiting. Negative for abdominal distention.  Genitourinary:  Positive for dysuria. Negative for difficulty urinating, pelvic pain, vaginal bleeding and vaginal discharge.  Neurological:  Positive for dizziness.  All other systems reviewed and are negative.   Physical Exam Updated Vital Signs BP (!) 123/90 (BP Location: Right Arm)   Pulse 78   Temp 98.7 F (37.1 C) (Oral)   Resp 18   Ht 5' (1.524 m)   Wt 79.8 kg   LMP 02/11/2024 (Exact Date)   SpO2 98%   BMI 34.37 kg/m  Physical Exam Vitals and nursing note reviewed.  Constitutional:      General: She is not in acute distress.    Appearance: Normal appearance. She is well-developed.  HENT:     Head: Normocephalic and atraumatic.     Mouth/Throat:     Mouth: Mucous membranes are moist.     Pharynx: Oropharynx is clear.  Eyes:     Extraocular Movements: Extraocular movements intact.     Conjunctiva/sclera: Conjunctivae normal.     Pupils: Pupils are equal, round, and reactive to light.  Cardiovascular:     Rate and Rhythm: Normal rate and regular rhythm.      Pulses: Normal pulses.     Heart sounds: Normal heart sounds, S1 normal and S2 normal. No murmur heard.    No friction rub. No gallop.  Pulmonary:     Effort: Pulmonary effort is normal.     Breath sounds: Normal breath sounds and air entry.  Abdominal:     General: Abdomen is flat. Bowel sounds are normal. There is no distension.     Palpations: Abdomen is soft. There is no hepatomegaly, splenomegaly, mass or pulsatile mass.     Tenderness: There is abdominal tenderness in the right upper quadrant, epigastric area and left lower quadrant. There is no right CVA tenderness or left CVA tenderness. Negative signs include Murphy's sign and McBurney's sign.  Musculoskeletal:        General: Normal range of motion.     Cervical back: Normal range of motion and neck supple.     Right lower leg: No edema.  Left lower leg: No edema.  Skin:    General: Skin is warm and dry.     Capillary Refill: Capillary refill takes less than 2 seconds.  Neurological:     General: No focal deficit present.     Mental Status: She is alert and oriented to person, place, and time. Mental status is at baseline.     GCS: GCS eye subscore is 4. GCS verbal subscore is 5. GCS motor subscore is 6.  Psychiatric:        Mood and Affect: Mood normal.     ED Results / Procedures / Treatments   Labs (all labs ordered are listed, but only abnormal results are displayed) Labs Reviewed  URINALYSIS, ROUTINE W REFLEX MICROSCOPIC - Abnormal; Notable for the following components:      Result Value   Protein, ur 30 (*)    All other components within normal limits  URINALYSIS, MICROSCOPIC (REFLEX) - Abnormal; Notable for the following components:   Bacteria, UA RARE (*)    All other components within normal limits  COMPREHENSIVE METABOLIC PANEL WITH GFR - Abnormal; Notable for the following components:   Glucose, Bld 103 (*)    Calcium  8.6 (*)    All other components within normal limits  CBC WITH  DIFFERENTIAL/PLATELET - Abnormal; Notable for the following components:   Hemoglobin 11.5 (*)    HCT 34.7 (*)    All other components within normal limits  PREGNANCY, URINE  LIPASE, BLOOD    EKG EKG Interpretation Date/Time:  Wednesday February 19 2024 10:14:07 EDT Ventricular Rate:  75 PR Interval:  147 QRS Duration:  77 QT Interval:  381 QTC Calculation: 426 R Axis:   43  Text Interpretation: Sinus rhythm Confirmed by Lowery Rue [phone]) on 02/19/2024 10:29:28 AM  Radiology CT ABDOMEN PELVIS WO CONTRAST Result Date: 02/19/2024 CLINICAL DATA:  Abdominal pain acute, nonlocalized EXAM: CT ABDOMEN AND PELVIS WITHOUT CONTRAST TECHNIQUE: Multidetector CT imaging of the abdomen and pelvis was performed following the standard protocol without IV contrast. RADIATION DOSE REDUCTION: This exam was performed according to the departmental dose-optimization program which includes automated exposure control, adjustment of the mA and/or kV according to patient size and/or use of iterative reconstruction technique. COMPARISON:  Feb 11, 2024 FINDINGS: Lower chest: No infiltrates or consolidations, no pleural effusions Hepatobiliary: No focal liver abnormality is seen. Status post cholecystectomy. No biliary dilatation. Pancreas: Pancreas normal size. No masses calcifications or inflammatory changes. Spleen: Spleen normal size.  No masses. Adrenals/Urinary Tract: Adrenal glands are normal size. Follow-up recommended. Kidneys are normal. No masses calcifications or hydronephrosis Stomach/Bowel: No small or large bowel obstruction or inflammatory changes. Moderate amount of residual fecal material throughout the colon without obstruction or constipation. Vascular/Lymphatic: No significant vascular findings are present. No enlarged abdominal or pelvic lymph nodes. Reproductive: Uterus and bilateral adnexa are unremarkable. Prior tubal ligation Other: Anterior abdominal wall unremarkable without evidence of umbilical  or inguinal hernias Musculoskeletal: Visualized portion of the thoracolumbar spine and pelvic structures grossly unremarkable without evidence of fracture bony abnormalities or soft tissue masses. IMPRESSION: *No acute findings in the abdomen or pelvis. *Moderate amount of residual fecal material throughout the colon without obstruction or constipation. *Status post cholecystectomy. Electronically Signed   By: Fredrich Jefferson M.D.   On: 02/19/2024 11:52    Procedures Procedures    Medications Ordered in ED Medications  sodium chloride  0.9 % bolus 1,000 mL (1,000 mLs Intravenous New Bag/Given 02/19/24 1101)    And  0.9 %  sodium chloride  infusion (has no administration in time range)  HYDROmorphone  (DILAUDID ) injection 0.5 mg (0.5 mg Intravenous Given 02/19/24 1101)  ondansetron  (ZOFRAN ) injection 4 mg (4 mg Intravenous Given 02/19/24 1100)  prochlorperazine  (COMPAZINE ) injection 5 mg (5 mg Intravenous Given 02/19/24 1212)    ED Course/ Medical Decision Making/ A&P                                 Medical Decision Making Amount and/or Complexity of Data Reviewed Labs: ordered. Radiology: ordered.  Risk Prescription drug management.   Medical Decision Making:   Maebell Lyvers is a 38 y.o. female who presented to the ED today with abdominal pain and back pain detailed above.    External chart has been reviewed including previous ED visits. Patient placed on continuous vitals and telemetry monitoring while in ED which was reviewed periodically.  Complete initial physical exam performed, notably the patient  was alert and oriented no apparent distress.  Abdomen was tender to the right upper quadrant and particularly tender to the left lower quadrant.  There is no CVA tenderness elicited.  Bowel sounds are present and normal.  Pulmonary and cardiac auscultation are unremarkable.  Abdomen is normal-appearing nondistended, no discoloration.  Oral mucosa or moist and normal-appearing.     Reviewed  and confirmed nursing documentation for past medical history, family history, social history.    Initial Assessment:   With the patient's presentation of abdominal and back pain, most likely diagnosis is potential continued cystitis, diverticulitis, enterocolitis. Other diagnoses were considered including (but not limited to) cardiac pathology, obstetric pathology. These are considered less likely due to history of present illness and physical exam findings.     Initial Plan:  Obtain CT imaging of the abdomen pelvis to evaluate for intra-abdominal pathologies. Screening labs including CBC and Metabolic panel to evaluate for infectious or metabolic etiology of disease.  Assess serum lipase to rule out pancreatic etiology Urinalysis with reflex culture ordered to evaluate for UTI or relevant urologic/nephrologic pathology.   EKG to evaluate for cardiac pathology Provide dose of IV hydromorphone  to manage her pain Provide dose of IV ondansetron  to manage nausea Objective evaluation as below reviewed   Initial Study Results:   Laboratory  All laboratory results reviewed without evidence of clinically relevant pathology.   Exceptions include: Hemoglobin 11.5.  EKG EKG was reviewed independently. Rate, rhythm, axis, intervals all examined and without medically relevant abnormality. ST segments without concerns for elevations.    Radiology:  All images reviewed independently. Agree with radiology report at this time.   CT ABDOMEN PELVIS WO CONTRAST Result Date: 02/19/2024 CLINICAL DATA:  Abdominal pain acute, nonlocalized EXAM: CT ABDOMEN AND PELVIS WITHOUT CONTRAST TECHNIQUE: Multidetector CT imaging of the abdomen and pelvis was performed following the standard protocol without IV contrast. RADIATION DOSE REDUCTION: This exam was performed according to the departmental dose-optimization program which includes automated exposure control, adjustment of the mA and/or kV according to patient size  and/or use of iterative reconstruction technique. COMPARISON:  Feb 11, 2024 FINDINGS: Lower chest: No infiltrates or consolidations, no pleural effusions Hepatobiliary: No focal liver abnormality is seen. Status post cholecystectomy. No biliary dilatation. Pancreas: Pancreas normal size. No masses calcifications or inflammatory changes. Spleen: Spleen normal size.  No masses. Adrenals/Urinary Tract: Adrenal glands are normal size. Follow-up recommended. Kidneys are normal. No masses calcifications or hydronephrosis Stomach/Bowel: No small or large bowel obstruction or inflammatory changes. Moderate  amount of residual fecal material throughout the colon without obstruction or constipation. Vascular/Lymphatic: No significant vascular findings are present. No enlarged abdominal or pelvic lymph nodes. Reproductive: Uterus and bilateral adnexa are unremarkable. Prior tubal ligation Other: Anterior abdominal wall unremarkable without evidence of umbilical or inguinal hernias Musculoskeletal: Visualized portion of the thoracolumbar spine and pelvic structures grossly unremarkable without evidence of fracture bony abnormalities or soft tissue masses. IMPRESSION: *No acute findings in the abdomen or pelvis. *Moderate amount of residual fecal material throughout the colon without obstruction or constipation. *Status post cholecystectomy. Electronically Signed   By: Fredrich Jefferson M.D.   On: 02/19/2024 11:52   CT Renal Stone Study Result Date: 02/11/2024 CLINICAL DATA:  Right lower quadrant pain. EXAM: CT ABDOMEN AND PELVIS WITHOUT CONTRAST TECHNIQUE: Multidetector CT imaging of the abdomen and pelvis was performed following the standard protocol without IV contrast. RADIATION DOSE REDUCTION: This exam was performed according to the departmental dose-optimization program which includes automated exposure control, adjustment of the mA and/or kV according to patient size and/or use of iterative reconstruction technique.  COMPARISON:  June 03, 2023 FINDINGS: Lower chest: No acute abnormality. Hepatobiliary: No focal liver abnormality is seen. Status post cholecystectomy. No biliary dilatation. Pancreas: Unremarkable. No pancreatic ductal dilatation or surrounding inflammatory changes. Spleen: Normal in size without focal abnormality. Adrenals/Urinary Tract: Adrenal glands are unremarkable. Kidneys are normal in size, without obstructing renal calculi, focal lesion, or hydronephrosis. A punctate nonobstructing renal calculus is seen within the mid right kidney (axial CT image 33, CT series 2). Tubal ligation clips are seen adjacent to the dome of an otherwise normal appearing urinary bladder. Stomach/Bowel: Stomach is within normal limits. Appendix appears normal. No evidence of bowel wall thickening, distention, or inflammatory changes. Noninflamed diverticula are seen throughout the large bowel. Vascular/Lymphatic: No significant vascular findings are present. No enlarged abdominal or pelvic lymph nodes. Reproductive: Uterus and bilateral adnexa are unremarkable. Other: No abdominal wall hernia or abnormality. No abdominopelvic ascites. Musculoskeletal: No acute or significant osseous findings. IMPRESSION: 1. Punctate nonobstructing right renal calculus. 2. Colonic diverticulosis. 3. Evidence of prior cholecystectomy. Electronically Signed   By: Virgle Grime M.D.   On: 02/11/2024 03:18   US  PELVIC COMPLETE W TRANSVAGINAL AND TORSION R/O Result Date: 02/11/2024 CLINICAL DATA:  Right lower quadrant pain EXAM: TRANSABDOMINAL AND TRANSVAGINAL ULTRASOUND OF PELVIS DOPPLER ULTRASOUND OF OVARIES TECHNIQUE: Both transabdominal and transvaginal ultrasound examinations of the pelvis were performed. Transabdominal technique was performed for global imaging of the pelvis including uterus, ovaries, adnexal regions, and pelvic cul-de-sac. Color and duplex Doppler ultrasound was utilized to evaluate blood flow to the ovaries.  COMPARISON:  CT abdomen pelvis 06/03/2023 FINDINGS: Uterus Measurements: 9.1 x 4.4 x 5.4 cm = volume: 111 mL. No fibroids or other mass visualized. C-section scar in the anterior lower uterine segment. Endometrium Thickness: 5 mm.  No focal abnormality visualized. Right ovary Measurements: 2.9 x 2.0 x 2.1 cm = volume: 6.3 mL. 1.3 cm corpus luteum cyst. No follow-up recommended. Left ovary Measurements: 2.9 x 2.0 x 1.4 cm = volume: 4.0 mL. Normal appearance/no adnexal mass. Pulsed Doppler evaluation of both ovaries demonstrates normal low-resistance arterial and venous waveforms. Other findings Trace free fluid in the pelvis. IMPRESSION: Unremarkable pelvic ultrasound.  No evidence of torsion. Electronically Signed   By: Rozell Cornet M.D.   On: 02/11/2024 02:14    Reassessment and Plan:    Clinical assessment along with CT imaging showing no acute findings, however was noncontrast due to contrast allergy,  and history of diverticulitis, will empirically treat for diverticulitis due to history of bloody stools and abdominal and back pain.  Urinalysis did not show any acute UTI, CBC did show a mild anemia of 11.5.  As such we will manage diverticulitis with outpatient course of Cipro  and Flagyl  due to allergies to Augmentin , also provide course of ondansetron  to manage her nausea, and provide an outpatient course of oxycodone  to manage pain.  Encourage follow-up with gastroenterology and primary care within the following week for management of her condition.  Through an interpreter, this was discussed thoroughly with the patient, she is understanding and agreeing to the care plan and has no further questions or concerns at this time.          Final Clinical Impression(s) / ED Diagnoses Final diagnoses:  None    Rx / DC Orders ED Discharge Orders     None         Juanetta Nordmann, PA 02/19/24 1326    Juanetta Nordmann, PA 02/19/24 1330    Lowery Rue, DO 02/19/24  1343

## 2024-02-19 NOTE — ED Notes (Signed)
 Attempted IV access x 2 using US .  Unable to obtain IV

## 2024-02-28 ENCOUNTER — Emergency Department (HOSPITAL_COMMUNITY): Payer: Self-pay

## 2024-02-28 ENCOUNTER — Other Ambulatory Visit: Payer: Self-pay

## 2024-02-28 ENCOUNTER — Emergency Department (HOSPITAL_COMMUNITY)
Admission: EM | Admit: 2024-02-28 | Discharge: 2024-02-28 | Disposition: A | Discharge status: Home / Self Care | Payer: Self-pay | Attending: Emergency Medicine | Admitting: Emergency Medicine

## 2024-02-28 DIAGNOSIS — R109 Unspecified abdominal pain: Secondary | ICD-10-CM | POA: Insufficient documentation

## 2024-02-28 LAB — URINALYSIS, ROUTINE W REFLEX MICROSCOPIC
Bilirubin Urine: NEGATIVE
Glucose, UA: NEGATIVE mg/dL
Hgb urine dipstick: NEGATIVE
Ketones, ur: NEGATIVE mg/dL
Leukocytes,Ua: NEGATIVE
Nitrite: NEGATIVE
Protein, ur: NEGATIVE mg/dL
Specific Gravity, Urine: 1.03 (ref 1.005–1.030)
pH: 5 (ref 5.0–8.0)

## 2024-02-28 LAB — CBC WITH DIFFERENTIAL/PLATELET
Abs Immature Granulocytes: 0.04 10*3/uL (ref 0.00–0.07)
Basophils Absolute: 0.1 10*3/uL (ref 0.0–0.1)
Basophils Relative: 1 %
Eosinophils Absolute: 0.1 10*3/uL (ref 0.0–0.5)
Eosinophils Relative: 1 %
HCT: 42.3 % (ref 36.0–46.0)
Hemoglobin: 14.1 g/dL (ref 12.0–15.0)
Immature Granulocytes: 0 %
Lymphocytes Relative: 29 %
Lymphs Abs: 2.6 10*3/uL (ref 0.7–4.0)
MCH: 30.5 pg (ref 26.0–34.0)
MCHC: 33.3 g/dL (ref 30.0–36.0)
MCV: 91.4 fL (ref 80.0–100.0)
Monocytes Absolute: 0.4 10*3/uL (ref 0.1–1.0)
Monocytes Relative: 5 %
Neutro Abs: 5.9 10*3/uL (ref 1.7–7.7)
Neutrophils Relative %: 64 %
Platelets: 287 10*3/uL (ref 150–400)
RBC: 4.63 MIL/uL (ref 3.87–5.11)
RDW: 12.9 % (ref 11.5–15.5)
WBC: 9.1 10*3/uL (ref 4.0–10.5)
nRBC: 0 % (ref 0.0–0.2)

## 2024-02-28 LAB — BASIC METABOLIC PANEL WITH GFR
Anion gap: 11 (ref 5–15)
BUN: 23 mg/dL — ABNORMAL HIGH (ref 6–20)
CO2: 20 mmol/L — ABNORMAL LOW (ref 22–32)
Calcium: 9.2 mg/dL (ref 8.9–10.3)
Chloride: 106 mmol/L (ref 98–111)
Creatinine, Ser: 0.89 mg/dL (ref 0.44–1.00)
GFR, Estimated: >60 mL/min (ref >60)
Glucose, Bld: 101 mg/dL — ABNORMAL HIGH (ref 70–99)
Potassium: 3.5 mmol/L (ref 3.5–5.1)
Sodium: 137 mmol/L (ref 135–145)

## 2024-02-28 LAB — PREGNANCY, URINE: Preg Test, Ur: NEGATIVE

## 2024-02-28 MED ORDER — ALUM & MAG HYDROXIDE-SIMETH 200-200-20 MG/5ML PO SUSP
30.0000 mL | Freq: Once | ORAL | Status: AC
  Administered 2024-02-28: 30 mL via ORAL
  Filled 2024-02-28: qty 30

## 2024-02-28 MED ORDER — LIDOCAINE VISCOUS HCL 2 % MT SOLN
15.0000 mL | Freq: Once | OROMUCOSAL | Status: AC
  Administered 2024-02-28: 15 mL via ORAL
  Filled 2024-02-28: qty 15

## 2024-02-28 MED ORDER — KETOROLAC TROMETHAMINE 15 MG/ML IJ SOLN
15.0000 mg | Freq: Once | INTRAMUSCULAR | Status: AC
  Administered 2024-02-28: 15 mg via INTRAVENOUS
  Filled 2024-02-28: qty 1

## 2024-02-28 MED ORDER — ONDANSETRON HCL 4 MG/2ML IJ SOLN
4.0000 mg | Freq: Once | INTRAMUSCULAR | Status: AC
  Administered 2024-02-28: 4 mg via INTRAVENOUS
  Filled 2024-02-28: qty 2

## 2024-02-28 MED ORDER — PANTOPRAZOLE SODIUM 20 MG PO TBEC: 20.0000 mg | DELAYED_RELEASE_TABLET | Freq: Every day | ORAL | 0 refills | Status: DC

## 2024-02-28 NOTE — ED Provider Notes (Signed)
 [place] EMERGENCY DEPARTMENT AT [facility] Provider Note   CSN: [id number] Arrival date & time: 02/28/24  1127     Patient presents with: Back Pain   Lorraine [name] is a 38 y.o. female.  {Add pertinent medical, surgical, social history, OB history to HPI:6672} 38 year old female with prior medical history as detailed below presents for evaluation.  Patient complains of left-sided back pain.  She reports that the pain travels to the left front.  She reports prior history of renal colic.  She reports that her pain began 2 to 3 days prior.  She denies fever.  She denies nausea or vomiting.  The history is provided by the patient.       Prior to Admission medications   Medication Sig Start Date End Date Taking? Authorizing Provider  acetaminophen  (TYLENOL ) 500 MG tablet Take 1 tablet (500 mg total) by mouth every 6 (six) hours as needed. Patient not taking: Reported on 01/14/2024 09/13/23   Debbra Fairy, PA-C  albuterol  (VENTOLIN  HFA) [phone] Base) MCG/ACT inhaler Inhale 2 puffs into the lungs every 4 (four) hours as needed for wheezing or shortness of breath. 08/25/22   Cheyenne Cotta, MD  azithromycin  (ZITHROMAX ) 250 MG tablet Take 1 tablet (250 mg total) by mouth daily. Take first 2 tablets together, then 1 every day until finished. Patient not taking: Reported on 02/19/2024 01/14/24   Onetha Bile, MD  benzonatate  (TESSALON ) 100 MG capsule Take 1 capsule (100 mg total) by mouth every 8 (eight) hours. Patient not taking: Reported on 02/19/2024 01/14/24   Onetha Bile, MD  ciprofloxacin  (CIPRO ) 500 MG tablet Take 1 tablet (500 mg total) by mouth every 12 (twelve) hours. 02/19/24   Juanetta Nordmann, PA  dicyclomine  (BENTYL ) 20 MG tablet Take 1 tablet (20 mg total) by mouth 2 (two) times daily. Patient not taking: Reported on 01/14/2024 08/20/23   Kingsley, Victoria K, DO  HYDROcodone -acetaminophen  (NORCO/VICODIN) 5-325 MG tablet Take 2 tablets by mouth every 6 (six)  hours as needed for severe pain (pain score 7-10). Patient not taking: Reported on 01/14/2024 10/19/23   Kommor, Alyse July, MD  ibuprofen  (ADVIL ) 800 MG tablet Take 1 tablet (800 mg total) by mouth every 8 (eight) hours as needed for moderate pain (pain score 4-6). 02/11/24   Rancour, Mara Seminole, MD  meclizine  (ANTIVERT ) 25 MG tablet Take 1 tablet (25 mg total) by mouth 3 (three) times daily as needed for dizziness. Patient not taking: Reported on 01/14/2024 09/19/23   Neil Balls A, PA  metoCLOPramide  (REGLAN ) 10 MG tablet Take 1 tablet (10 mg total) by mouth every 6 (six) hours. Patient not taking: Reported on 01/14/2024 08/19/23   Afton Horse T, DO  metroNIDAZOLE  (FLAGYL ) 500 MG tablet Take 1 tablet (500 mg total) by mouth 2 (two) times daily. 02/19/24   Juanetta Nordmann, PA  nitrofurantoin , macrocrystal-monohydrate, (MACROBID ) 100 MG capsule Take 1 capsule (100 mg total) by mouth 2 (two) times daily. 02/11/24   Rancour, Mara Seminole, MD  omeprazole  (PRILOSEC) 20 MG capsule Take 1 capsule (20 mg total) by mouth daily. Patient not taking: Reported on 02/19/2024 08/19/23 02/19/24  Afton Horse T, DO  ondansetron  (ZOFRAN ) 4 MG tablet Take 1 tablet (4 mg total) by mouth every 6 (six) hours. 02/19/24   Juanetta Nordmann, PA  ondansetron  (ZOFRAN -ODT) 4 MG disintegrating tablet Take 1 tablet (4 mg total) by mouth every 8 (eight) hours as needed for nausea or vomiting. 02/11/24   Earma Gloss, MD  oseltamivir  (TAMIFLU ) 75  MG capsule Take 1 capsule (75 mg total) by mouth every 12 (twelve) hours. Patient not taking: Reported on 01/14/2024 09/17/23   Franaszek, Amanda, PA-C  oxyCODONE -acetaminophen  (PERCOCET/ROXICET) 5-325 MG tablet Take 1 tablet by mouth every 6 (six) hours as needed for severe pain (pain score 7-10). 02/19/24   Juanetta Nordmann, PA  pantoprazole  (PROTONIX ) 20 MG tablet Take 1 tablet (20 mg total) by mouth daily for 14 days. Patient not taking: Reported on 02/19/2024 07/25/23 02/19/24  Rolinda Climes,  DO  predniSONE  (DELTASONE ) 50 MG tablet Take 1 tablet (50 mg total) by mouth daily. Patient not taking: Reported on 02/19/2024 01/14/24   Onetha Bile, MD  promethazine  (PHENERGAN ) 25 MG suppository Place 1 suppository (25 mg total) rectally every 6 (six) hours as needed for nausea or vomiting. Patient not taking: Reported on 01/14/2024 09/19/23   Belleair Butter, PA  sucralfate  (CARAFATE ) 1 GM/10ML suspension Take 10 mLs (1 g total) by mouth 4 (four) times daily -  with meals and at bedtime. Patient not taking: Reported on 01/14/2024 08/02/23   Lindle Rhea, MD  cetirizine  (ZYRTEC  ALLERGY) 10 MG tablet Take 1 tablet (10 mg total) by mouth 2 (two) times daily. Patient not taking: Reported on 12/06/2020 11/12/20 01/24/21  Muthersbaugh, Alisa App, PA-C    Allergies: Iodinated contrast media, Lupine bean extract, Ceftriaxone , and Morphine     Review of Systems  All other systems reviewed and are negative.   Updated Vital Signs BP (!) 130/92 (BP Location: Right Arm)   Pulse 81   Temp 98.3 F (36.8 C) (Oral)   Resp 16   LMP 02/11/2024 (Exact Date)   SpO2 99%   Physical Exam Vitals and nursing note reviewed.  Constitutional:      General: She is not in acute distress.    Appearance: Normal appearance. She is well-developed.  HENT:     Head: Normocephalic and atraumatic.   Eyes:     Conjunctiva/sclera: Conjunctivae normal.     Pupils: Pupils are equal, round, and reactive to light.    Cardiovascular:     Rate and Rhythm: Normal rate and regular rhythm.     Heart sounds: Normal heart sounds.  Pulmonary:     Effort: Pulmonary effort is normal. No respiratory distress.     Breath sounds: Normal breath sounds.  Abdominal:     General: There is no distension.     Palpations: Abdomen is soft.     Tenderness: There is no abdominal tenderness.   Musculoskeletal:        General: No deformity. Normal range of motion.     Cervical back: Normal range of motion and neck supple.    Skin:    General: Skin is warm and dry.   Neurological:     General: No focal deficit present.     Mental Status: She is alert and oriented to person, place, and time.     (all labs ordered are listed, but only abnormal results are displayed) Labs Reviewed  BASIC METABOLIC PANEL WITH GFR - Abnormal; Notable for the following components:      Result Value   CO2 20 (*)    Glucose, Bld 101 (*)    BUN 23 (*)    All other components within normal limits  URINALYSIS, ROUTINE W REFLEX MICROSCOPIC - Abnormal; Notable for the following components:   APPearance HAZY (*)    All other components within normal limits  PREGNANCY, URINE  CBC WITH DIFFERENTIAL/PLATELET    EKG:  None  Radiology: No results found.  {Document cardiac monitor, telemetry assessment procedure when appropriate:32947} Procedures   Medications Ordered in the ED  ondansetron  (ZOFRAN ) injection 4 mg (has no administration in time range)  ketorolac  (TORADOL ) 15 MG/ML injection 15 mg (has no administration in time range)      {Click here for ABCD2, HEART and other calculators REFRESH Note before signing:1}                              Medical Decision Making Amount and/or Complexity of Data Reviewed Labs: ordered. Radiology: ordered.  Risk Prescription drug management.    Medical Screen Complete  This patient presented to the ED with complaint of left-sided flank pain.  This complaint involves an extensive number of treatment options. The initial differential diagnosis includes, but is not limited to, renal colic, other intra-abdominal pathology, metabolic abnormality  This presentation is: Acute, Self-Limited, Previously Undiagnosed, Uncertain Prognosis, Complicated, Systemic Symptoms, and Threat to Life/Bodily Function  Patient presenting with left-sided flank pain.  Screening labs obtained are without significant abnormality.    Additional history obtained: External records from outside  sources obtained and reviewed including prior ED visits and prior Inpatient records.    Lab Tests:  I ordered and personally interpreted labs.  The pertinent results include:  ***   Imaging Studies ordered:  I ordered imaging studies including ***  I independently visualized and interpreted obtained imaging which showed *** I agree with the radiologist interpretation.   Cardiac Monitoring:  The patient was maintained on a cardiac monitor.  I personally viewed and interpreted the cardiac monitor which showed an underlying rhythm of: ***   Medicines ordered:  I ordered medication including ***  for ***  Reevaluation of the patient after these medicines showed that the patient: {resolved/improved/worsened:23923::improved}    Test Considered:  ***   Critical Interventions:  ***   Consultations Obtained:  I consulted ***,  and discussed lab and imaging findings as well as pertinent plan of care.    Problem List / ED Course:  ***   Reevaluation:  After the interventions noted above, I reevaluated the patient and found that they have: {resolved/improved/worsened:23923::improved}   Social Determinants of Health:  ***   Disposition:  After consideration of the diagnostic results and the patients response to treatment, I feel that the patent would benefit from ***.    {Document critical care time when appropriate  Document review of labs and clinical decision tools ie CHADS2VASC2, etc  Document your independent review of radiology images and any outside records  Document your discussion with family members, caretakers and with consultants  Document social determinants of health affecting pt's care  Document your decision making why or why not admission, treatments were needed:32947:::1}   Final diagnoses:  None    ED Discharge Orders     None

## 2024-02-28 NOTE — ED Triage Notes (Addendum)
 Pt has been experiencing intermittent back L sided/flank pain that began last night. Pain starts in L lower back and travels to front of abdomen. Pt endorses painful urination and hematuria. Endorses nausea and constipation. Denies chest pain/sob. Last BM this morning

## 2024-02-28 NOTE — Discharge Instructions (Signed)
 Return for any problem.  ?

## 2024-05-22 ENCOUNTER — Emergency Department (HOSPITAL_BASED_OUTPATIENT_CLINIC_OR_DEPARTMENT_OTHER): Payer: Self-pay

## 2024-05-22 ENCOUNTER — Other Ambulatory Visit: Payer: Self-pay

## 2024-05-22 ENCOUNTER — Emergency Department (HOSPITAL_BASED_OUTPATIENT_CLINIC_OR_DEPARTMENT_OTHER)
Admission: EM | Admit: 2024-05-22 | Discharge: 2024-05-22 | Disposition: A | Discharge status: Home / Self Care | Payer: Self-pay | Attending: Emergency Medicine | Admitting: Emergency Medicine

## 2024-05-22 ENCOUNTER — Encounter (HOSPITAL_BASED_OUTPATIENT_CLINIC_OR_DEPARTMENT_OTHER): Payer: Self-pay | Admitting: Urology

## 2024-05-22 DIAGNOSIS — J45909 Unspecified asthma, uncomplicated: Secondary | ICD-10-CM | POA: Insufficient documentation

## 2024-05-22 DIAGNOSIS — R102 Pelvic and perineal pain: Secondary | ICD-10-CM | POA: Insufficient documentation

## 2024-05-22 DIAGNOSIS — R109 Unspecified abdominal pain: Secondary | ICD-10-CM

## 2024-05-22 DIAGNOSIS — R110 Nausea: Secondary | ICD-10-CM | POA: Insufficient documentation

## 2024-05-22 DIAGNOSIS — R309 Painful micturition, unspecified: Secondary | ICD-10-CM | POA: Insufficient documentation

## 2024-05-22 LAB — COMPREHENSIVE METABOLIC PANEL WITH GFR
ALT: 54 U/L — ABNORMAL HIGH (ref 0–44)
AST: 64 U/L — ABNORMAL HIGH (ref 15–41)
Albumin: 4.6 g/dL (ref 3.5–5.0)
Alkaline Phosphatase: 109 U/L (ref 38–126)
Anion gap: 14 (ref 5–15)
BUN: 14 mg/dL (ref 6–20)
CO2: 22 mmol/L (ref 22–32)
Calcium: 9.4 mg/dL (ref 8.9–10.3)
Chloride: 104 mmol/L (ref 98–111)
Creatinine, Ser: 0.82 mg/dL (ref 0.44–1.00)
GFR, Estimated: >60 mL/min (ref >60)
Glucose, Bld: 105 mg/dL — ABNORMAL HIGH (ref 70–99)
Potassium: 3.2 mmol/L — ABNORMAL LOW (ref 3.5–5.1)
Sodium: 140 mmol/L (ref 135–145)
Total Bilirubin: 0.3 mg/dL (ref 0.0–1.2)
Total Protein: 7.8 g/dL (ref 6.5–8.1)

## 2024-05-22 LAB — CBC WITH DIFFERENTIAL/PLATELET
Abs Immature Granulocytes: 0.03 K/uL (ref 0.00–0.07)
Basophils Absolute: 0 K/uL (ref 0.0–0.1)
Basophils Relative: 1 %
Eosinophils Absolute: 0.1 K/uL (ref 0.0–0.5)
Eosinophils Relative: 1 %
HCT: 38.8 % (ref 36.0–46.0)
Hemoglobin: 13.1 g/dL (ref 12.0–15.0)
Immature Granulocytes: 0 %
Lymphocytes Relative: 34 %
Lymphs Abs: 2.9 K/uL (ref 0.7–4.0)
MCH: 30.1 pg (ref 26.0–34.0)
MCHC: 33.8 g/dL (ref 30.0–36.0)
MCV: 89.2 fL (ref 80.0–100.0)
Monocytes Absolute: 0.4 K/uL (ref 0.1–1.0)
Monocytes Relative: 4 %
Neutro Abs: 5.1 K/uL (ref 1.7–7.7)
Neutrophils Relative %: 60 %
Platelets: 291 K/uL (ref 150–400)
RBC: 4.35 MIL/uL (ref 3.87–5.11)
RDW: 13 % (ref 11.5–15.5)
WBC: 8.5 K/uL (ref 4.0–10.5)
nRBC: 0 % (ref 0.0–0.2)

## 2024-05-22 LAB — URINALYSIS, ROUTINE W REFLEX MICROSCOPIC
Bilirubin Urine: NEGATIVE
Glucose, UA: NEGATIVE mg/dL
Hgb urine dipstick: NEGATIVE
Ketones, ur: NEGATIVE mg/dL
Leukocytes,Ua: NEGATIVE
Nitrite: NEGATIVE
Protein, ur: NEGATIVE mg/dL
Specific Gravity, Urine: 1.025 (ref 1.005–1.030)
pH: 6 (ref 5.0–8.0)

## 2024-05-22 LAB — WET PREP, GENITAL
Clue Cells Wet Prep HPF POC: NONE SEEN
Sperm: NONE SEEN
Trich, Wet Prep: NONE SEEN
WBC, Wet Prep HPF POC: <10 (ref <10)
Yeast Wet Prep HPF POC: NONE SEEN

## 2024-05-22 LAB — PREGNANCY, URINE: Preg Test, Ur: NEGATIVE

## 2024-05-22 LAB — LIPASE, BLOOD: Lipase: 37 U/L (ref 11–51)

## 2024-05-22 MED ORDER — ONDANSETRON 4 MG PO TBDP: 4.0000 mg | ORAL_TABLET | Freq: Three times a day (TID) | ORAL | 0 refills | Status: DC | PRN

## 2024-05-22 MED ORDER — DICYCLOMINE HCL 20 MG PO TABS: 20.0000 mg | ORAL_TABLET | Freq: Three times a day (TID) | ORAL | 0 refills | Status: DC | PRN

## 2024-05-22 MED ORDER — KETOROLAC TROMETHAMINE 30 MG/ML IJ SOLN
30.0000 mg | Freq: Once | INTRAMUSCULAR | Status: AC
  Administered 2024-05-22: 30 mg via INTRAVENOUS
  Filled 2024-05-22: qty 1

## 2024-05-22 NOTE — ED Triage Notes (Signed)
 Interpreter [phone] Lower pelvic pain with urination that started today  Reports some nausea  Pain is constant, denies hematuria  H/o kidney stones

## 2024-05-22 NOTE — ED Provider Notes (Signed)
 Emergency Department Provider Note   I have reviewed the triage vital signs and the nursing notes.   HISTORY  Chief Complaint Dysuria and Pelvic Pain  Spanish interpreter used for this encounter.   HPI Lorraine [name] is a 38 y.o. female with past history reviewed below presents to the emergency department for evaluation of left flank pain.  She has had some associated lower pelvic pain with urination.  Symptoms began today.  She has had some associated nausea but no vomiting.  No hematuria.  No diarrhea.  No pain into the chest or shortness of breath. No fever.   Past Medical History:  Diagnosis Date   Angio-edema    Asthma    Inhaler used 01/02/16   Diverticulitis    Nausea & vomiting 04/02/2017   Pancreatitis    Pre-diabetes    Pyelonephritis    Urticaria     Review of Systems  Constitutional: No fever/chills Cardiovascular: Denies chest pain. Respiratory: Denies shortness of breath. Gastrointestinal: Positive left abdominal pain.  No nausea, no vomiting.   Skin: Negative for rash. Neurological: Negative for headaches.   ____________________________________________   PHYSICAL EXAM:  VITAL SIGNS: ED Triage Vitals  Encounter Vitals Group     BP 05/22/24 2016 121/67     Pulse Rate 05/22/24 2016 78     Resp 05/22/24 2016 18     Temp 05/22/24 2016 98.5 F (36.9 C)     Temp Source 05/22/24 2016 Oral     SpO2 05/22/24 2016 100 %     Weight 05/22/24 2015 175 lb 14.8 oz (79.8 kg)     Height 05/22/24 2015 5' (1.524 m)   Constitutional: Alert and oriented. Well appearing and in no acute distress. Eyes: Conjunctivae are normal.  Head: Atraumatic. Nose: No congestion/rhinnorhea. Mouth/Throat: Mucous membranes are moist.  Oropharynx non-erythematous. Neck: No stridor.   Cardiovascular: Normal rate, regular rhythm. Good peripheral circulation. Grossly normal heart sounds.   Respiratory: Normal respiratory effort.  No retractions. Lungs CTAB. Gastrointestinal:  Soft and nontender. No distention.  Genitourinary: Pelvic exam performed after obtaining verbal consent using the Spanish interpreter at bedside.  Normal external genitalia.  No CMT. mild discharge.  Musculoskeletal: No gross deformities of extremities. Neurologic:  Normal speech and language.  Skin:  Skin is warm, dry and intact. No rash noted.   ____________________________________________   LABS (all labs ordered are listed, but only abnormal results are displayed)  Labs Reviewed  URINALYSIS, ROUTINE W REFLEX MICROSCOPIC - Abnormal; Notable for the following components:      Result Value   APPearance HAZY (*)    All other components within normal limits  COMPREHENSIVE METABOLIC PANEL WITH GFR - Abnormal; Notable for the following components:   Potassium 3.2 (*)    Glucose, Bld 105 (*)    AST 64 (*)    ALT 54 (*)    All other components within normal limits  WET PREP, GENITAL  PREGNANCY, URINE  LIPASE, BLOOD  CBC WITH DIFFERENTIAL/PLATELET  GC/CHLAMYDIA PROBE AMP ([place]) NOT AT [facility]   ____________________________________________   PROCEDURES  Procedure(s) performed:   Procedures  None  ____________________________________________   INITIAL IMPRESSION / ASSESSMENT AND PLAN / ED COURSE  Pertinent labs & imaging results that were available during my care of the patient were reviewed by me and considered in my medical decision making (see chart for details).   This patient is Presenting for Evaluation of flank pain, which does require a range of treatment options, and is  a complaint that involves a high risk of morbidity and mortality.  The Differential Diagnoses includes but is not exclusive to ectopic pregnancy, ovarian cyst, ovarian torsion, acute appendicitis, urinary tract infection, endometriosis, bowel obstruction, hernia, colitis, renal colic, gastroenteritis, volvulus etc.   Critical Interventions-    Medications  ketorolac  (TORADOL ) 30 MG/ML  injection 30 mg (30 mg Intravenous Given 05/22/24 2106)    Reassessment after intervention: pain improved.      Clinical Laboratory Tests Ordered, included UA without evidence of infection.  Pregnancy negative.  CBC without leukocytosis or anemia.  No AKI.  Lipase and LFTs largely unremarkable.  Radiologic Tests Ordered, included CT renal. . I independently interpreted the images and agree with radiology interpretation.   Medical Decision Making: Summary:  Patient presents emergency department left flank pain.  No anterior tenderness on exam.  Pelvic exam reassuring.  Not consistent with PID or torsion.  Adnexa visualized on CT unremarkable without inflammation, large cyst, obvious/large dilated ovary.  Do not feel that pelvic ultrasound is indicated on an emergent basis.  Reevaluation with update and discussion with patient.  CT unremarkable.  Pain improved with Toradol .  Plan for close PCP follow-up.  Stable for discharge. Discussed results and follow up plan with Spanish interpreter.   Patient's presentation is most consistent with acute presentation with potential threat to life or bodily function.   Disposition: discharge  ____________________________________________  FINAL CLINICAL IMPRESSION(S) / ED DIAGNOSES  Final diagnoses:  Pelvic pain in female  Left flank pain     Note:  This document was prepared using Dragon voice recognition software and may include unintentional dictation errors.  Fonda Law, MD, [facility] Emergency Medicine    Yehudis Monceaux, Fonda MATSU, MD 05/25/24 [phone]

## 2024-05-25 LAB — GC/CHLAMYDIA PROBE AMP (~~LOC~~) NOT AT ARMC
Chlamydia: NEGATIVE
Comment: NEGATIVE
Comment: NORMAL
Neisseria Gonorrhea: NEGATIVE

## 2024-06-05 ENCOUNTER — Emergency Department (HOSPITAL_COMMUNITY): Payer: Self-pay

## 2024-06-05 ENCOUNTER — Other Ambulatory Visit: Payer: Self-pay

## 2024-06-05 ENCOUNTER — Emergency Department (HOSPITAL_COMMUNITY)
Admission: EM | Admit: 2024-06-05 | Discharge: 2024-06-05 | Disposition: A | Discharge status: Home / Self Care | Payer: Self-pay | Attending: Emergency Medicine | Admitting: Emergency Medicine

## 2024-06-05 DIAGNOSIS — R739 Hyperglycemia, unspecified: Secondary | ICD-10-CM | POA: Insufficient documentation

## 2024-06-05 DIAGNOSIS — R1012 Left upper quadrant pain: Secondary | ICD-10-CM | POA: Insufficient documentation

## 2024-06-05 DIAGNOSIS — N390 Urinary tract infection, site not specified: Secondary | ICD-10-CM | POA: Insufficient documentation

## 2024-06-05 DIAGNOSIS — J45909 Unspecified asthma, uncomplicated: Secondary | ICD-10-CM | POA: Insufficient documentation

## 2024-06-05 DIAGNOSIS — E876 Hypokalemia: Secondary | ICD-10-CM | POA: Insufficient documentation

## 2024-06-05 DIAGNOSIS — R109 Unspecified abdominal pain: Secondary | ICD-10-CM

## 2024-06-05 LAB — URINALYSIS, W/ REFLEX TO CULTURE (INFECTION SUSPECTED)
Bilirubin Urine: NEGATIVE
Glucose, UA: NEGATIVE mg/dL
Hgb urine dipstick: NEGATIVE
Ketones, ur: NEGATIVE mg/dL
Nitrite: NEGATIVE
Protein, ur: NEGATIVE mg/dL
Specific Gravity, Urine: 1.014 (ref 1.005–1.030)
pH: 6 (ref 5.0–8.0)

## 2024-06-05 LAB — CBC WITH DIFFERENTIAL/PLATELET
Abs Immature Granulocytes: 0.04 K/uL (ref 0.00–0.07)
Basophils Absolute: 0.1 K/uL (ref 0.0–0.1)
Basophils Relative: 1 %
Eosinophils Absolute: 0.1 K/uL (ref 0.0–0.5)
Eosinophils Relative: 2 %
HCT: 37.5 % (ref 36.0–46.0)
Hemoglobin: 12.1 g/dL (ref 12.0–15.0)
Immature Granulocytes: 1 %
Lymphocytes Relative: 42 %
Lymphs Abs: 3.8 K/uL (ref 0.7–4.0)
MCH: 29.8 pg (ref 26.0–34.0)
MCHC: 32.3 g/dL (ref 30.0–36.0)
MCV: 92.4 fL (ref 80.0–100.0)
Monocytes Absolute: 0.5 K/uL (ref 0.1–1.0)
Monocytes Relative: 5 %
Neutro Abs: 4.4 K/uL (ref 1.7–7.7)
Neutrophils Relative %: 49 %
Platelets: 251 K/uL (ref 150–400)
RBC: 4.06 MIL/uL (ref 3.87–5.11)
RDW: 12.5 % (ref 11.5–15.5)
WBC: 8.9 K/uL (ref 4.0–10.5)
nRBC: 0 % (ref 0.0–0.2)

## 2024-06-05 LAB — D-DIMER, QUANTITATIVE: D-Dimer, Quant: <0.27 ug{FEU}/mL (ref 0.00–0.50)

## 2024-06-05 LAB — COMPREHENSIVE METABOLIC PANEL WITH GFR
ALT: 43 U/L (ref 0–44)
AST: 37 U/L (ref 15–41)
Albumin: 4.1 g/dL (ref 3.5–5.0)
Alkaline Phosphatase: 109 U/L (ref 38–126)
Anion gap: 14 (ref 5–15)
BUN: 12 mg/dL (ref 6–20)
CO2: 20 mmol/L — ABNORMAL LOW (ref 22–32)
Calcium: 8.9 mg/dL (ref 8.9–10.3)
Chloride: 106 mmol/L (ref 98–111)
Creatinine, Ser: 0.63 mg/dL (ref 0.44–1.00)
GFR, Estimated: >60 mL/min (ref >60)
Glucose, Bld: 104 mg/dL — ABNORMAL HIGH (ref 70–99)
Potassium: 3.2 mmol/L — ABNORMAL LOW (ref 3.5–5.1)
Sodium: 139 mmol/L (ref 135–145)
Total Bilirubin: 0.3 mg/dL (ref 0.0–1.2)
Total Protein: 7 g/dL (ref 6.5–8.1)

## 2024-06-05 LAB — HCG, SERUM, QUALITATIVE: Preg, Serum: NEGATIVE

## 2024-06-05 MED ORDER — ONDANSETRON 4 MG PO TBDP: 4.0000 mg | ORAL_TABLET | Freq: Three times a day (TID) | ORAL | 0 refills | Status: DC | PRN

## 2024-06-05 MED ORDER — ONDANSETRON HCL 4 MG/2ML IJ SOLN
4.0000 mg | Freq: Once | INTRAMUSCULAR | Status: AC
  Administered 2024-06-05: 4 mg via INTRAVENOUS
  Filled 2024-06-05: qty 2

## 2024-06-05 MED ORDER — POTASSIUM CHLORIDE CRYS ER 20 MEQ PO TBCR: 20.0000 meq | EXTENDED_RELEASE_TABLET | Freq: Two times a day (BID) | ORAL | 0 refills | Status: DC

## 2024-06-05 MED ORDER — OXYCODONE HCL 5 MG PO TABS: 5.0000 mg | ORAL_TABLET | ORAL | 0 refills | Status: DC | PRN

## 2024-06-05 MED ORDER — KETOROLAC TROMETHAMINE 30 MG/ML IJ SOLN
30.0000 mg | Freq: Once | INTRAMUSCULAR | Status: AC
  Administered 2024-06-05: 30 mg via INTRAVENOUS
  Filled 2024-06-05: qty 1

## 2024-06-05 MED ORDER — CIPROFLOXACIN HCL 500 MG PO TABS
500.0000 mg | ORAL_TABLET | Freq: Once | ORAL | Status: AC
Start: 2024-06-05 — End: 2024-06-05
  Administered 2024-06-05: 500 mg via ORAL
  Filled 2024-06-05: qty 1

## 2024-06-05 MED ORDER — CIPROFLOXACIN HCL 500 MG PO TABS: 500.0000 mg | ORAL_TABLET | Freq: Two times a day (BID) | ORAL | 0 refills | Status: AC

## 2024-06-05 MED ORDER — OXYCODONE-ACETAMINOPHEN 5-325 MG PO TABS
1.0000 | ORAL_TABLET | Freq: Once | ORAL | Status: AC
  Administered 2024-06-05: 1 via ORAL
  Filled 2024-06-05: qty 1

## 2024-06-05 MED ORDER — POTASSIUM CHLORIDE CRYS ER 20 MEQ PO TBCR
40.0000 meq | EXTENDED_RELEASE_TABLET | Freq: Once | ORAL | Status: AC
Start: 2024-06-05 — End: 2024-06-05
  Administered 2024-06-05: 40 meq via ORAL
  Filled 2024-06-05: qty 2

## 2024-06-05 NOTE — Discharge Instructions (Signed)
 Su tomografa computarizada no mostr ningn problema grave. Su orina mostr signos de infeccin, y creo que eso es lo que le est causando Chief Technology Officer. Tome el [employer] veces al da. Beba muchos lquidos. Puede tomar acetaminofn o ibuprofeno segn sea necesario para el dolor. Al combinar acetaminofn e ibuprofeno, se obtiene un mayor alivio del dolor que tomando cualquiera de los [place] medicamentos por separado. Si an necesita ms alivio del [employer], Health and safety inspector oxicodona.

## 2024-06-05 NOTE — ED Notes (Signed)
 Pt still complaining of a pain of 8 in the lower back

## 2024-06-05 NOTE — ED Provider Notes (Signed)
 [place] EMERGENCY DEPARTMENT AT [facility] Provider Note   CSN: [id number] Arrival date & time: 06/05/24  0010     Patient presents with: Back Pain   Lorraine [name] is a 38 y.o. female.   The history is provided by the patient. A language interpreter was used Gennie [phone]).  Back Pain  She has history of asthma, diverticulitis, pancreatitis and comes in because of flank pain which woke her up at 4 AM.  Pain does radiate around to the left upper abdomen/lower chest.  It is worse when she lays on her left side and worse when she takes a deep breath.  She endorses some dyspnea as well as nausea and vomiting.  She denies any dysuria or urinary urgency or frequency or tenesmus.  She has had subjective fever and chills but no sweats.  She denies any cough.  She took acetaminophen  with    Prior to Admission medications   Medication Sig Start Date End Date Taking? Authorizing Provider  acetaminophen  (TYLENOL ) 500 MG tablet Take 1 tablet (500 mg total) by mouth every 6 (six) hours as needed. Patient not taking: Reported on 01/14/2024 09/13/23   Nivia Colon, PA-C  albuterol  (VENTOLIN  HFA) 108 [phone] Base) MCG/ACT inhaler Inhale 2 puffs into the lungs every 4 (four) hours as needed for wheezing or shortness of breath. 08/25/22   Suzette Pac, MD  azithromycin  (ZITHROMAX ) 250 MG tablet Take 1 tablet (250 mg total) by mouth daily. Take first 2 tablets together, then 1 every day until finished. Patient not taking: Reported on 02/19/2024 01/14/24   Jerral Meth, MD  benzonatate  (TESSALON ) 100 MG capsule Take 1 capsule (100 mg total) by mouth every 8 (eight) hours. Patient not taking: Reported on 02/19/2024 01/14/24   Jerral Meth, MD  ciprofloxacin  (CIPRO ) 500 MG tablet Take 1 tablet (500 mg total) by mouth every 12 (twelve) hours. 02/19/24   Myriam Dorn BROCKS, PA  dicyclomine  (BENTYL ) 20 MG tablet Take 1 tablet (20 mg total) by mouth 3 (three) times daily as needed for spasms.  05/22/24   Long, Fonda MATSU, MD  HYDROcodone -acetaminophen  (NORCO/VICODIN) 5-325 MG tablet Take 2 tablets by mouth every 6 (six) hours as needed for severe pain (pain score 7-10). Patient not taking: Reported on 01/14/2024 10/19/23   Kommor, Lum, MD  ibuprofen  (ADVIL ) 800 MG tablet Take 1 tablet (800 mg total) by mouth every 8 (eight) hours as needed for moderate pain (pain score 4-6). 02/11/24   Rancour, Garnette, MD  meclizine  (ANTIVERT ) 25 MG tablet Take 1 tablet (25 mg total) by mouth 3 (three) times daily as needed for dizziness. Patient not taking: Reported on 01/14/2024 09/19/23   Silver Fell A, PA  metoCLOPramide  (REGLAN ) 10 MG tablet Take 1 tablet (10 mg total) by mouth every 6 (six) hours. Patient not taking: Reported on 01/14/2024 08/19/23   Mannie Pac T, DO  metroNIDAZOLE  (FLAGYL ) 500 MG tablet Take 1 tablet (500 mg total) by mouth 2 (two) times daily. 02/19/24   Myriam Dorn BROCKS, PA  nitrofurantoin , macrocrystal-monohydrate, (MACROBID ) 100 MG capsule Take 1 capsule (100 mg total) by mouth 2 (two) times daily. 02/11/24   Rancour, Garnette, MD  omeprazole  (PRILOSEC) 20 MG capsule Take 1 capsule (20 mg total) by mouth daily. Patient not taking: Reported on 02/19/2024 08/19/23 02/19/24  Mannie Pac T, DO  ondansetron  (ZOFRAN ) 4 MG tablet Take 1 tablet (4 mg total) by mouth every 6 (six) hours. 02/19/24   Myriam Dorn BROCKS, PA  ondansetron  (ZOFRAN -ODT) 4  MG disintegrating tablet Take 1 tablet (4 mg total) by mouth every 8 (eight) hours as needed. 05/22/24   Long, Fonda MATSU, MD  oseltamivir  (TAMIFLU ) 75 MG capsule Take 1 capsule (75 mg total) by mouth every 12 (twelve) hours. Patient not taking: Reported on 01/14/2024 09/17/23   Franaszek, Amanda, PA-C  oxyCODONE -acetaminophen  (PERCOCET/ROXICET) 5-325 MG tablet Take 1 tablet by mouth every 6 (six) hours as needed for severe pain (pain score 7-10). 02/19/24   Myriam Dorn BROCKS, PA  pantoprazole  (PROTONIX ) 20 MG tablet Take 1 tablet (20 mg total) by  mouth daily for 14 days. Patient not taking: Reported on 02/19/2024 07/25/23 02/19/24  Neysa Caron PARAS, DO  pantoprazole  (PROTONIX ) 20 MG tablet Take 1 tablet (20 mg total) by mouth daily. 02/28/24   Laurice Maude BROCKS, MD  predniSONE  (DELTASONE ) 50 MG tablet Take 1 tablet (50 mg total) by mouth daily. Patient not taking: Reported on 02/19/2024 01/14/24   Jerral Meth, MD  promethazine  (PHENERGAN ) 25 MG suppository Place 1 suppository (25 mg total) rectally every 6 (six) hours as needed for nausea or vomiting. Patient not taking: Reported on 01/14/2024 09/19/23   Silver Fell A, PA  sucralfate  (CARAFATE ) 1 GM/10ML suspension Take 10 mLs (1 g total) by mouth 4 (four) times daily -  with meals and at bedtime. Patient not taking: Reported on 01/14/2024 08/02/23   Trine Raynell Moder, MD  cetirizine  (ZYRTEC  ALLERGY) 10 MG tablet Take 1 tablet (10 mg total) by mouth 2 (two) times daily. Patient not taking: Reported on 12/06/2020 11/12/20 01/24/21  Muthersbaugh, Chiquita, PA-C    Allergies: Iodinated contrast media, Lupine bean extract, Ceftriaxone , and Morphine     Review of Systems  Musculoskeletal:  Positive for back pain.  All other systems reviewed and are negative.   Updated Vital Signs BP (!) 152/100   Pulse 70   Temp 98 F (36.7 C) (Oral)   Resp 20   LMP 06/01/2024 (Approximate)   SpO2 100%   Physical Exam Vitals and nursing note reviewed.   38 year old female, appears uncomfortable, but is in no acute distress. Vital signs are significant for elevated blood pressure. Oxygen  saturation is 100%, which is normal. Head is normocephalic and atraumatic. PERRLA, EOMI.  Neck is nontender and supple Back is nontender in the midline.  There is mild to moderate left CVA tenderness. Lungs are clear without rales, wheezes, or rhonchi. Chest is nontender. Heart has regular rate and rhythm without murmur. Abdomen is soft, flat, with mild left upper quadrant tenderness.  There is no rebound or  guarding. Extremities have no cyanosis or edema, full range of motion is present. Skin is warm and dry without rash. Neurologic: Mental status is normal, cranial nerves are intact, moves all extremities equally.  (all labs ordered are listed, but only abnormal results are displayed) Labs Reviewed  URINALYSIS, W/ REFLEX TO CULTURE (INFECTION SUSPECTED) - Abnormal; Notable for the following components:      Result Value   APPearance HAZY (*)    Leukocytes,Ua LARGE (*)    Bacteria, UA RARE (*)    All other components within normal limits  COMPREHENSIVE METABOLIC PANEL WITH GFR - Abnormal; Notable for the following components:   Potassium 3.2 (*)    CO2 20 (*)    Glucose, Bld 104 (*)    All other components within normal limits  URINE CULTURE  CBC WITH DIFFERENTIAL/PLATELET  D-DIMER, QUANTITATIVE  HCG, SERUM, QUALITATIVE    EKG Interpretation Date/Time:  Friday June 05 2024  02:18:01 EDT Ventricular Rate:  63 PR Interval:  147 QRS Duration:  104 QT Interval:  447 QTC Calculation: 458 R Axis:   52  Text Interpretation: Sinus rhythm Low voltage, precordial leads Otherwise within normal limits When compared with ECG of 02/19/2024, No significant change was found Confirmed by Raford Lenis ([id number]) on 06/05/2024 2:28:05 AM        Radiology: CT Renal Stone Study Result Date: 06/05/2024 CLINICAL DATA:  Flank pain EXAM: CT ABDOMEN AND PELVIS WITHOUT CONTRAST TECHNIQUE: Multidetector CT imaging of the abdomen and pelvis was performed following the standard protocol without IV contrast. RADIATION DOSE REDUCTION: This exam was performed according to the departmental dose-optimization program which includes automated exposure control, adjustment of the mA and/or kV according to patient size and/or use of iterative reconstruction technique. COMPARISON:  05/22/2024 FINDINGS: Lower chest: No acute abnormality. Hepatobiliary: No focal liver abnormality is seen. Status post cholecystectomy. No  biliary dilatation. Pancreas: Unremarkable. No pancreatic ductal dilatation or surrounding inflammatory changes. Spleen: Normal in size without focal abnormality. Adrenals/Urinary Tract: Adrenal glands are within normal limits. Kidneys are well visualized bilaterally. No renal calculi or obstructive changes are seen. The bladder is well distended. Stomach/Bowel: No obstructive or inflammatory changes of the colon are seen. Mild diverticular changes noted without evidence of diverticulitis. The appendix is within normal limits. No inflammatory changes are seen. Small bowel and stomach are within normal limits. Vascular/Lymphatic: No significant vascular findings are present. No enlarged abdominal or pelvic lymph nodes. Reproductive: Uterus and bilateral adnexa are unremarkable. Other: None Musculoskeletal: No acute bony abnormality is noted IMPRESSION: Diverticulosis without diverticulitis. No renal or ureteral stones are seen. The overall appearance is stable from the prior exam. Electronically Signed   By: Oneil Devonshire M.D.   On: 06/05/2024 04:00     Procedures   Medications Ordered in the ED  ondansetron  (ZOFRAN ) injection 4 mg (4 mg Intravenous Given 06/05/24 0254)  ketorolac  (TORADOL ) 30 MG/ML injection 30 mg (30 mg Intravenous Given 06/05/24 0254)  ciprofloxacin  (CIPRO ) tablet 500 mg (500 mg Oral Given 06/05/24 0521)  oxyCODONE -acetaminophen  (PERCOCET/ROXICET) 5-325 MG per tablet 1 tablet (1 tablet Oral Given 06/05/24 0521)  potassium chloride  SA (KLOR-CON  M) CR tablet 40 mEq (40 mEq Oral Given 06/05/24 0521)                                    Medical Decision Making Amount and/or Complexity of Data Reviewed Labs: ordered. Radiology: ordered.  Risk Prescription drug management.   Left flank and chest pain which has a pleuritic component.  This is a presentation with wide range of treatment options and carries with it a high risk of morbidity and complications.  Differential diagnosis  includes, but is not limited to, musculoskeletal pain, urolithiasis, pyelonephritis, pulmonary embolism, pneumonia, ACS.  I have reviewed her past records, and she has had multiple CT scans of her abdomen and pelvis over the last 4 months, most recently on ninth test 01/2024 which showed no urinary tract calculi although when I reviewed the images I do believe I see some punctate calculi in both kidneys.  I have ordered a repeat CT scan.  ED visit on 02/28/2024 was for left flank pain although patient states that today's pain is different from what she had then.  I have ordered ketorolac  for pain and screening labs including urinalysis.  Because of pleuritic component of pain, I am also ordering a  D-dimer to rule out pulmonary embolism.  I have reviewed her electrocardiogram and my interpretation is low voltage but otherwise normal ECG.  I have reviewed her laboratory tests, and my interpretation is not pregnant, normal D-dimer-no evidence of pulmonary embolism, normal CBC, hypokalemia, borderline elevated random glucose level, pyuria suggesting UTI.  Urine has been sent for culture.  CT scan shows diverticulosis without diverticulitis, no evidence of urolithiasis or other acute pathology.  I have independently viewed the images, and agree with the radiologist's interpretation.  I have ordered a dose of ciprofloxacin  (patient is allergic to cephalosporins).  I have ordered a dose of oral potassium.  Patient complained of ongoing pain, and a dose of oxycodone -acetaminophen .  I am discharging her with prescription for ciprofloxacin , ondansetron  oral dissolving tablet, oral potassium, and a small number of oxycodone  tablets to use for breakthrough pain.  Primary pain control should be with acetaminophen  and ibuprofen .     Final diagnoses:  Left flank pain  Urinary tract infection without hematuria, site unspecified  Hypokalemia  Elevated random blood glucose level    ED Discharge Orders          Ordered     potassium chloride  SA (KLOR-CON  M) 20 MEQ tablet  2 times daily        06/05/24 0511    oxyCODONE  (ROXICODONE ) 5 MG immediate release tablet  Every 4 hours PRN        06/05/24 0511    ciprofloxacin  (CIPRO ) 500 MG tablet  Every 12 hours        06/05/24 0511    ondansetron  (ZOFRAN -ODT) 4 MG disintegrating tablet  Every 8 hours PRN        06/05/24 0513               Raford Lenis, MD 06/05/24 [phone]

## 2024-06-05 NOTE — ED Triage Notes (Signed)
 PT stated when she takes a deep breath or lays down she has pain on the left side of her back. The pain started in the morning when she woke up.

## 2024-06-07 LAB — URINE CULTURE: Culture: >=100000 — AB

## 2024-06-08 ENCOUNTER — Telehealth (HOSPITAL_BASED_OUTPATIENT_CLINIC_OR_DEPARTMENT_OTHER): Payer: Self-pay | Admitting: *Deleted

## 2024-06-08 NOTE — Telephone Encounter (Signed)
 Post ED Visit - Positive Culture Follow-up  Culture report reviewed by antimicrobial stewardship pharmacist: [facility] []  Rankin Dee, Pharm.D. []  Venetia Gully, Pharm.D., BCPS AQ-ID []  Garrel Crews, Pharm.D., BCPS []  Almarie Lunger, Pharm.D., BCPS []  [place], [address].D., BCPS, AAHIVP []  Rosaline Bihari, Pharm.D., BCPS, AAHIVP []  Vernell Meier, PharmD, BCPS []  Latanya Hint, PharmD, BCPS []  Donald Medley, PharmD, BCPS []  Rocky Bold, PharmD []  Dorothyann Alert, PharmD, BCPS []  Morene Babe, PharmD  [facility] [x] Almarie Lunger,  PharmD []  Romona Bliss, PharmD []  Dolphus Roller, PharmD []  Veva Seip, Rph []  Vernell Daunt) Leonce, PharmD []  Eva Allis, PharmD []  Rosaline Millet, PharmD []  Iantha Batch, PharmD []  Arvin Gauss, PharmD []  Wanda Hasting, PharmD []  Ronal Rav, PharmD []  Rocky Slade, PharmD []  Bard Jeans, PharmD   Positive urine culture Treated with Ciprofloxacin , organism sensitive to the same and no further patient follow-up is required at this time.  Albino Alan Novak 06/08/2024, 12:38 PM

## 2024-06-09 ENCOUNTER — Encounter (HOSPITAL_COMMUNITY): Payer: Self-pay

## 2024-06-09 ENCOUNTER — Other Ambulatory Visit: Payer: Self-pay

## 2024-06-09 ENCOUNTER — Emergency Department (HOSPITAL_COMMUNITY)
Admission: EM | Admit: 2024-06-09 | Discharge: 2024-06-09 | Disposition: A | Discharge status: Home / Self Care | Payer: Self-pay | Attending: Emergency Medicine | Admitting: Emergency Medicine

## 2024-06-09 DIAGNOSIS — E876 Hypokalemia: Secondary | ICD-10-CM | POA: Insufficient documentation

## 2024-06-09 DIAGNOSIS — R1084 Generalized abdominal pain: Secondary | ICD-10-CM | POA: Insufficient documentation

## 2024-06-09 DIAGNOSIS — J45909 Unspecified asthma, uncomplicated: Secondary | ICD-10-CM | POA: Insufficient documentation

## 2024-06-09 LAB — CBC WITH DIFFERENTIAL/PLATELET
Abs Immature Granulocytes: 0.04 K/uL (ref 0.00–0.07)
Basophils Absolute: 0.1 K/uL (ref 0.0–0.1)
Basophils Relative: 1 %
Eosinophils Absolute: 0.1 K/uL (ref 0.0–0.5)
Eosinophils Relative: 1 %
HCT: 38.5 % (ref 36.0–46.0)
Hemoglobin: 12.6 g/dL (ref 12.0–15.0)
Immature Granulocytes: 1 %
Lymphocytes Relative: 35 %
Lymphs Abs: 3 K/uL (ref 0.7–4.0)
MCH: 29.7 pg (ref 26.0–34.0)
MCHC: 32.7 g/dL (ref 30.0–36.0)
MCV: 90.8 fL (ref 80.0–100.0)
Monocytes Absolute: 0.6 K/uL (ref 0.1–1.0)
Monocytes Relative: 7 %
Neutro Abs: 4.8 K/uL (ref 1.7–7.7)
Neutrophils Relative %: 55 %
Platelets: 261 K/uL (ref 150–400)
RBC: 4.24 MIL/uL (ref 3.87–5.11)
RDW: 12.6 % (ref 11.5–15.5)
WBC: 8.6 K/uL (ref 4.0–10.5)
nRBC: 0 % (ref 0.0–0.2)

## 2024-06-09 LAB — COMPREHENSIVE METABOLIC PANEL WITH GFR
ALT: 43 U/L (ref 0–44)
AST: 33 U/L (ref 15–41)
Albumin: 3.8 g/dL (ref 3.5–5.0)
Alkaline Phosphatase: 112 U/L (ref 38–126)
Anion gap: 12 (ref 5–15)
BUN: 13 mg/dL (ref 6–20)
CO2: 21 mmol/L — ABNORMAL LOW (ref 22–32)
Calcium: 8.9 mg/dL (ref 8.9–10.3)
Chloride: 107 mmol/L (ref 98–111)
Creatinine, Ser: 0.68 mg/dL (ref 0.44–1.00)
GFR, Estimated: >60 mL/min (ref >60)
Glucose, Bld: 107 mg/dL — ABNORMAL HIGH (ref 70–99)
Potassium: 3.2 mmol/L — ABNORMAL LOW (ref 3.5–5.1)
Sodium: 140 mmol/L (ref 135–145)
Total Bilirubin: 0.5 mg/dL (ref 0.0–1.2)
Total Protein: 7.3 g/dL (ref 6.5–8.1)

## 2024-06-09 LAB — URINALYSIS, ROUTINE W REFLEX MICROSCOPIC
Bilirubin Urine: NEGATIVE
Glucose, UA: NEGATIVE mg/dL
Hgb urine dipstick: NEGATIVE
Ketones, ur: NEGATIVE mg/dL
Leukocytes,Ua: NEGATIVE
Nitrite: NEGATIVE
Protein, ur: NEGATIVE mg/dL
Specific Gravity, Urine: 1.02 (ref 1.005–1.030)
pH: 6 (ref 5.0–8.0)

## 2024-06-09 LAB — LIPASE, BLOOD: Lipase: 33 U/L (ref 11–51)

## 2024-06-09 MED ORDER — POTASSIUM CHLORIDE CRYS ER 20 MEQ PO TBCR
20.0000 meq | EXTENDED_RELEASE_TABLET | Freq: Once | ORAL | Status: AC
  Administered 2024-06-09: 20 meq via ORAL
  Filled 2024-06-09: qty 1

## 2024-06-09 MED ORDER — ONDANSETRON HCL 4 MG/2ML IJ SOLN
4.0000 mg | Freq: Once | INTRAMUSCULAR | Status: DC
  Filled 2024-06-09: qty 2

## 2024-06-09 MED ORDER — ONDANSETRON 4 MG PO TBDP
4.0000 mg | ORAL_TABLET | Freq: Once | ORAL | Status: AC
  Administered 2024-06-09: 4 mg via ORAL
  Filled 2024-06-09: qty 1

## 2024-06-09 MED ORDER — KETOROLAC TROMETHAMINE 15 MG/ML IJ SOLN
15.0000 mg | Freq: Once | INTRAMUSCULAR | Status: AC
  Administered 2024-06-09: 15 mg via INTRAMUSCULAR
  Filled 2024-06-09: qty 1

## 2024-06-09 MED ORDER — PANTOPRAZOLE SODIUM 40 MG PO TBEC: 40.0000 mg | DELAYED_RELEASE_TABLET | Freq: Every day | ORAL | 0 refills | Status: DC

## 2024-06-09 NOTE — ED Triage Notes (Signed)
 Pt arrived via POV c/o recurrent left flank pain. Pt reports taking prescribed medications from [facility] a few days ago w/o relief. Pt reports it hurts more when she urinates.

## 2024-06-09 NOTE — ED Provider Notes (Signed)
 [place] EMERGENCY DEPARTMENT AT [facility] Provider Note   CSN: [id number] Arrival date & time: 06/09/24  1625     Patient presents with: Flank Pain   Lorraine [name] is a 38 y.o. female.  She reports history of asthma, prediabetes, pancreatitis, diverticulitis.  Presents ER today for continued left-sided flank pain that spreads across mid abdomen all the way to the right.  She was seen in the emergency department on 9/19 for same pain and was prescribed antibiotics for UTI and given small amount of Norco.  She states the pain is now affecting the right side.  She has been compliant with her medicines, she denies any known fever but states she has felt hot.  She admits to nausea.  Denies vomiting.  She reports this feels the same as when she has been in the ER for abdominal pain multiple times over the past several months without definitive answers.  She denies exacerbating or alleviating factors.  Patient speak Spanish, translator used, Gonzalo [phone], was used for this encounter.    Flank Pain       Prior to Admission medications   Medication Sig Start Date End Date Taking? Authorizing Provider  albuterol  (VENTOLIN  HFA) 108 (90 Base) MCG/ACT inhaler Inhale 2 puffs into the lungs every 4 (four) hours as needed for wheezing or shortness of breath. 08/25/22   Suzette Pac, MD  ciprofloxacin  (CIPRO ) 500 MG tablet Take 1 tablet (500 mg total) by mouth every 12 (twelve) hours. 06/05/24   Raford Lenis, MD  dicyclomine  (BENTYL ) 20 MG tablet Take 1 tablet (20 mg total) by mouth 3 (three) times daily as needed for spasms. 05/22/24   Long, Fonda MATSU, MD  ibuprofen  (ADVIL ) 800 MG tablet Take 1 tablet (800 mg total) by mouth every 8 (eight) hours as needed for moderate pain (pain score 4-6). 02/11/24   Rancour, Garnette, MD  ondansetron  (ZOFRAN ) 4 MG tablet Take 1 tablet (4 mg total) by mouth every 6 (six) hours. 02/19/24   Myriam Dorn BROCKS, PA  ondansetron  (ZOFRAN -ODT) 4 MG  disintegrating tablet Take 1 tablet (4 mg total) by mouth every 8 (eight) hours as needed. 06/05/24   Raford Lenis, MD  oxyCODONE  (ROXICODONE ) 5 MG immediate release tablet Take 1 tablet (5 mg total) by mouth every 4 (four) hours as needed for severe pain (pain score 7-10). 06/05/24   Raford Lenis, MD  potassium chloride  SA (KLOR-CON  M) 20 MEQ tablet Take 1 tablet (20 mEq total) by mouth 2 (two) times daily. 06/05/24   Raford Lenis, MD  cetirizine  (ZYRTEC  ALLERGY) 10 MG tablet Take 1 tablet (10 mg total) by mouth 2 (two) times daily. Patient not taking: Reported on 12/06/2020 11/12/20 01/24/21  Muthersbaugh, Chiquita, PA-C    Allergies: Iodinated contrast media, Lupine bean extract, Ceftriaxone , and Morphine     Review of Systems  Genitourinary:  Positive for flank pain.    Updated Vital Signs BP (!) 129/92 (BP Location: Right Arm)   Pulse 88   Temp 98.2 F (36.8 C) (Oral)   Resp 18   Ht 5' (1.524 m)   Wt 79.8 kg   LMP 06/01/2024 (Approximate)   SpO2 100%   BMI 34.36 kg/m   Physical Exam Vitals and nursing note reviewed.  Constitutional:      General: She is not in acute distress.    Appearance: She is well-developed.  HENT:     Head: Normocephalic and atraumatic.  Eyes:     Conjunctiva/sclera: Conjunctivae normal.  Cardiovascular:  Rate and Rhythm: Normal rate and regular rhythm.     Heart sounds: No murmur heard. Pulmonary:     Effort: Pulmonary effort is normal. No respiratory distress.     Breath sounds: Normal breath sounds.  Abdominal:     Palpations: Abdomen is soft.     Tenderness: There is no abdominal tenderness. There is no right CVA tenderness, left CVA tenderness, guarding or rebound.  Musculoskeletal:        General: No swelling.     Cervical back: Neck supple.  Skin:    General: Skin is warm and dry.     Capillary Refill: Capillary refill takes less than 2 seconds.  Neurological:     General: No focal deficit present.     Mental Status: She is alert and  oriented to person, place, and time.  Psychiatric:        Mood and Affect: Mood normal.     (all labs ordered are listed, but only abnormal results are displayed) Labs Reviewed  URINALYSIS, ROUTINE W REFLEX MICROSCOPIC - Abnormal; Notable for the following components:      Result Value   APPearance HAZY (*)    All other components within normal limits  COMPREHENSIVE METABOLIC PANEL WITH GFR - Abnormal; Notable for the following components:   Potassium 3.2 (*)    CO2 21 (*)    Glucose, Bld 107 (*)    All other components within normal limits  LIPASE, BLOOD  CBC WITH DIFFERENTIAL/PLATELET    EKG: None  Radiology: No results found.   Procedures   Medications Ordered in the ED - No data to display                                  Medical Decision Making This patient presents to the ED for concern of continued abdominal pain, this involves an extensive number of treatment options, and is a complaint that carries with it a high risk of complications and morbidity.  The differential diagnosis includes UTI, pyelonephritis, sepsis, electrolyte derangement, gastritis, gastroenteritis, pancreatitis, ureterolithiasis, other   Co morbidities that complicate the patient evaluation  History of pancreatitis, diverticulitis, recent diagnosis of UTI   Additional history obtained:  Additional history obtained from EMR External records from outside source obtained and reviewed including previous notes, labs, recent urine culture showing E. coli sensitive to the ciprofloxacin  that she was prescribed.   Lab Tests:  I Ordered, and personally interpreted labs.  The pertinent results include: CBC with no leukocytosis or anemia, lipase is normal, CMP with mild hypokalemia, UA with hazy urine but no nitrate, no leukocytes     Problem List / ED Course / Critical interventions / Medication management  Patient having continued abdominal pain, she has had several episodes of this starting  in May and she states this feels the same.  She has had about 5 CT scans and also had a pelvic ultrasound that were all essentially normal.  She denies any change in symptoms.  She is tolerating p.o., she clinically looks well, her abdomen has tenderness in the left and right upper quadrants but no rebound guarding or rigidity.  At this time I do not feel further imaging is indicated.  Patient is not appropriate back for her UTI, plan to treat her symptoms and have her follow-up with PCP as well as with GI given recurrent episodes of abdominal pain. I ordered medication including Toradol  and Zofran   for abdominal pain Reevaluation of the patient after these medicines showed that the patient improved I have reviewed the patients home medicines and have made adjustments as needed       Amount and/or Complexity of Data Reviewed Labs: ordered.  Risk Prescription drug management.        Final diagnoses:  None    ED Discharge Orders     None          Suellen Sherran DELENA DEVONNA 06/09/24 2049    Elnor Savant A, DO 06/17/24 1514

## 2024-06-09 NOTE — Discharge Instructions (Addendum)
 Hoy lo evaluaron en urgencias por dolor persistente en la espalda y el abdomen. Sus pruebas son tranquilizadoras. Termine sus antibiticos para la infeccin urinaria. Sus tomografas computarizadas han resultado normales, as que no la repetimos hoy. Dado que tiene dolor abdominal recurrente, debera consultar con [employer]. Le voy a recetar un medicamento para la [employer]. Vuelva si aparecen sntomas nuevos o si empeoran.

## 2024-07-10 ENCOUNTER — Other Ambulatory Visit: Payer: Self-pay

## 2024-07-10 ENCOUNTER — Encounter (HOSPITAL_COMMUNITY): Payer: Self-pay

## 2024-07-10 ENCOUNTER — Emergency Department (HOSPITAL_COMMUNITY)
Admission: EM | Admit: 2024-07-10 | Discharge: 2024-07-10 | Disposition: A | Discharge status: Home / Self Care | Payer: Self-pay | Attending: Emergency Medicine | Admitting: Emergency Medicine

## 2024-07-10 ENCOUNTER — Emergency Department (HOSPITAL_COMMUNITY): Payer: Self-pay

## 2024-07-10 DIAGNOSIS — J45909 Unspecified asthma, uncomplicated: Secondary | ICD-10-CM | POA: Insufficient documentation

## 2024-07-10 DIAGNOSIS — R1084 Generalized abdominal pain: Secondary | ICD-10-CM | POA: Insufficient documentation

## 2024-07-10 DIAGNOSIS — E876 Hypokalemia: Secondary | ICD-10-CM | POA: Insufficient documentation

## 2024-07-10 LAB — URINALYSIS, ROUTINE W REFLEX MICROSCOPIC
Bilirubin Urine: NEGATIVE
Glucose, UA: NEGATIVE mg/dL
Hgb urine dipstick: NEGATIVE
Ketones, ur: 20 mg/dL — AB
Leukocytes,Ua: NEGATIVE
Nitrite: NEGATIVE
Protein, ur: NEGATIVE mg/dL
Specific Gravity, Urine: 1.026 (ref 1.005–1.030)
pH: 5 (ref 5.0–8.0)

## 2024-07-10 LAB — CBC WITH DIFFERENTIAL/PLATELET
Abs Immature Granulocytes: 0.02 K/uL (ref 0.00–0.07)
Basophils Absolute: 0.1 K/uL (ref 0.0–0.1)
Basophils Relative: 1 %
Eosinophils Absolute: 0.3 K/uL (ref 0.0–0.5)
Eosinophils Relative: 4 %
HCT: 36.7 % (ref 36.0–46.0)
Hemoglobin: 12.3 g/dL (ref 12.0–15.0)
Immature Granulocytes: 0 %
Lymphocytes Relative: 45 %
Lymphs Abs: 3.4 K/uL (ref 0.7–4.0)
MCH: 30.6 pg (ref 26.0–34.0)
MCHC: 33.5 g/dL (ref 30.0–36.0)
MCV: 91.3 fL (ref 80.0–100.0)
Monocytes Absolute: 0.5 K/uL (ref 0.1–1.0)
Monocytes Relative: 6 %
Neutro Abs: 3.3 K/uL (ref 1.7–7.7)
Neutrophils Relative %: 44 %
Platelets: 238 K/uL (ref 150–400)
RBC: 4.02 MIL/uL (ref 3.87–5.11)
RDW: 13 % (ref 11.5–15.5)
WBC: 7.6 K/uL (ref 4.0–10.5)
nRBC: 0 % (ref 0.0–0.2)

## 2024-07-10 LAB — COMPREHENSIVE METABOLIC PANEL WITH GFR
ALT: 30 U/L (ref 0–44)
AST: 26 U/L (ref 15–41)
Albumin: 4.4 g/dL (ref 3.5–5.0)
Alkaline Phosphatase: 93 U/L (ref 38–126)
Anion gap: 10 (ref 5–15)
BUN: 14 mg/dL (ref 6–20)
CO2: 27 mmol/L (ref 22–32)
Calcium: 9 mg/dL (ref 8.9–10.3)
Chloride: 103 mmol/L (ref 98–111)
Creatinine, Ser: 0.76 mg/dL (ref 0.44–1.00)
GFR, Estimated: >60 mL/min (ref >60)
Glucose, Bld: 107 mg/dL — ABNORMAL HIGH (ref 70–99)
Potassium: 3 mmol/L — ABNORMAL LOW (ref 3.5–5.1)
Sodium: 140 mmol/L (ref 135–145)
Total Bilirubin: 0.3 mg/dL (ref 0.0–1.2)
Total Protein: 7.2 g/dL (ref 6.5–8.1)

## 2024-07-10 LAB — POC URINE PREG, ED: Preg Test, Ur: NEGATIVE

## 2024-07-10 LAB — TROPONIN T, HIGH SENSITIVITY
Troponin T High Sensitivity: <15 ng/L (ref 0–19)
Troponin T High Sensitivity: <15 ng/L (ref 0–19)

## 2024-07-10 LAB — LIPASE, BLOOD: Lipase: 23 U/L (ref 11–51)

## 2024-07-10 MED ORDER — ONDANSETRON 4 MG PO TBDP: 4.0000 mg | ORAL_TABLET | Freq: Three times a day (TID) | ORAL | 0 refills | Status: DC | PRN

## 2024-07-10 MED ORDER — KETOROLAC TROMETHAMINE 15 MG/ML IJ SOLN
15.0000 mg | Freq: Once | INTRAMUSCULAR | Status: AC
  Administered 2024-07-10: 15 mg via INTRAVENOUS
  Filled 2024-07-10: qty 1

## 2024-07-10 MED ORDER — HYDROMORPHONE HCL 1 MG/ML IJ SOLN
0.5000 mg | Freq: Once | INTRAMUSCULAR | Status: AC
  Administered 2024-07-10: 0.5 mg via INTRAVENOUS
  Filled 2024-07-10: qty 0.5

## 2024-07-10 MED ORDER — DICYCLOMINE HCL 20 MG PO TABS: 20.0000 mg | ORAL_TABLET | Freq: Two times a day (BID) | ORAL | 0 refills | Status: DC

## 2024-07-10 MED ORDER — SODIUM CHLORIDE 0.9 % IV BOLUS
1000.0000 mL | Freq: Once | INTRAVENOUS | Status: AC
  Administered 2024-07-10: 1000 mL via INTRAVENOUS

## 2024-07-10 MED ORDER — POTASSIUM CHLORIDE CRYS ER 20 MEQ PO TBCR
40.0000 meq | EXTENDED_RELEASE_TABLET | Freq: Once | ORAL | Status: AC
  Administered 2024-07-10: 40 meq via ORAL
  Filled 2024-07-10: qty 2

## 2024-07-10 MED ORDER — ONDANSETRON HCL 4 MG/2ML IJ SOLN
4.0000 mg | Freq: Once | INTRAMUSCULAR | Status: AC
  Administered 2024-07-10: 4 mg via INTRAVENOUS
  Filled 2024-07-10: qty 2

## 2024-07-10 NOTE — ED Provider Notes (Signed)
 [place] EMERGENCY DEPARTMENT AT [facility] Provider Note   CSN: [id number] Arrival date & time: 07/10/24  9072     Patient presents with: Chest Pain   Lorraine [name] is a 38 y.o. female.   Patient with history of diverticulitis, pancreatitis presents today with complaints of bilateral flank pain and upper abdominal pain.  Reports that same began yesterday when she was working.  Reports that she did have some tightness in her chest and some shortness of breath during this episode when her pain began yesterday, however this is since resolved.  However her flank and abdominal pain have persisted.  She reports that her pain is similar in nature to previous visits.  She tried to get into see GI, however she could not afford the co-pay.  Reports she had a few episodes of nausea and vomiting this morning.  Denies any hematemesis.  She is having regular bowel movements and passing flatus.  Denies any recent travel or surgeries, no leg pain or leg swelling.  No current chest pain or shortness of breath.  Does report she thought she might have had a fever as she felt warm this morning but she did not check her temperature.  Denies cough or congestion.   The history is provided by the patient. A language interpreter was used (Family member at bedside interprets).  Chest Pain Associated symptoms: abdominal pain        Prior to Admission medications   Medication Sig Start Date End Date Taking? Authorizing Provider  albuterol  (VENTOLIN  HFA) 108 (90 Base) MCG/ACT inhaler Inhale 2 puffs into the lungs every 4 (four) hours as needed for wheezing or shortness of breath. 08/25/22   Zammit, Joseph, MD  ciprofloxacin  (CIPRO ) 500 MG tablet Take 1 tablet (500 mg total) by mouth every 12 (twelve) hours. 06/05/24   Raford Lenis, MD  dicyclomine  (BENTYL ) 20 MG tablet Take 1 tablet (20 mg total) by mouth 3 (three) times daily as needed for spasms. 05/22/24   Long, Fonda MATSU, MD  ibuprofen  (ADVIL ) 800  MG tablet Take 1 tablet (800 mg total) by mouth every 8 (eight) hours as needed for moderate pain (pain score 4-6). 02/11/24   Rancour, Garnette, MD  ondansetron  (ZOFRAN ) 4 MG tablet Take 1 tablet (4 mg total) by mouth every 6 (six) hours. 02/19/24   Myriam Dorn BROCKS, PA  ondansetron  (ZOFRAN -ODT) 4 MG disintegrating tablet Take 1 tablet (4 mg total) by mouth every 8 (eight) hours as needed. 06/05/24   Raford Lenis, MD  oxyCODONE  (ROXICODONE ) 5 MG immediate release tablet Take 1 tablet (5 mg total) by mouth every 4 (four) hours as needed for severe pain (pain score 7-10). 06/05/24   Raford Lenis, MD  pantoprazole  (PROTONIX ) 40 MG tablet Take 1 tablet (40 mg total) by mouth daily. 06/09/24   Suellen Cantor A, PA-C  potassium chloride  SA (KLOR-CON  M) 20 MEQ tablet Take 1 tablet (20 mEq total) by mouth 2 (two) times daily. 06/05/24   Raford Lenis, MD  cetirizine  (ZYRTEC  ALLERGY) 10 MG tablet Take 1 tablet (10 mg total) by mouth 2 (two) times daily. Patient not taking: Reported on 12/06/2020 11/12/20 01/24/21  Muthersbaugh, Chiquita, PA-C    Allergies: Iodinated contrast media, Lupine bean extract, Ceftriaxone , and Morphine     Review of Systems  Cardiovascular:  Positive for chest pain.  Gastrointestinal:  Positive for abdominal pain.  Genitourinary:  Positive for flank pain.  All other systems reviewed and are negative.   Updated Vital Signs BP  120/77   Pulse 65   Temp 97.7 F (36.5 C) (Oral)   Resp 14   Ht 5' (1.524 m)   Wt 79.8 kg   LMP 06/26/2024   SpO2 98%   BMI 34.36 kg/m   Physical Exam Vitals and nursing note reviewed.  Constitutional:      General: She is not in acute distress.    Appearance: Normal appearance. She is normal weight. She is not ill-appearing, toxic-appearing or diaphoretic.  HENT:     Head: Normocephalic and atraumatic.  Cardiovascular:     Rate and Rhythm: Normal rate and regular rhythm.     Heart sounds: Normal heart sounds.  Pulmonary:     Effort: Pulmonary  effort is normal. No respiratory distress.     Breath sounds: Normal breath sounds.  Abdominal:     Comments: Tenderness noted to the bilateral flank areas as well as the bilateral upper abdominal area. No rebound or guarding. Negative Murphy sign  Musculoskeletal:        General: Normal range of motion.     Cervical back: Normal range of motion.     Right lower leg: No tenderness. No edema.     Left lower leg: No tenderness. No edema.  Skin:    General: Skin is warm and dry.  Neurological:     General: No focal deficit present.     Mental Status: She is alert.  Psychiatric:        Mood and Affect: Mood normal.        Behavior: Behavior normal.     (all labs ordered are listed, but only abnormal results are displayed) Labs Reviewed  URINALYSIS, ROUTINE W REFLEX MICROSCOPIC - Abnormal; Notable for the following components:      Result Value   APPearance HAZY (*)    Ketones, ur 20 (*)    All other components within normal limits  COMPREHENSIVE METABOLIC PANEL WITH GFR - Abnormal; Notable for the following components:   Potassium 3.0 (*)    Glucose, Bld 107 (*)    All other components within normal limits  LIPASE, BLOOD  CBC WITH DIFFERENTIAL/PLATELET  CBC WITH DIFFERENTIAL/PLATELET  POC URINE PREG, ED  TROPONIN T, HIGH SENSITIVITY  TROPONIN T, HIGH SENSITIVITY    EKG: EKG Interpretation Date/Time:  Friday July 10 2024 10:02:14 EDT Ventricular Rate:  61 PR Interval:  141 QRS Duration:  81 QT Interval:  462 QTC Calculation: 466 R Axis:   43  Text Interpretation: Sinus rhythm Low voltage, precordial leads no stemi Interpretation limited secondary to artifact Confirmed by Elnor Savant ([id number]) on 07/10/2024 10:25:25 AM  Radiology: DG Chest Portable 1 View Result Date: 07/10/2024 CLINICAL DATA:  chest pain EXAM: DG CHEST 1V PORT COMPARISON:  01/14/2024 FINDINGS: No focal airspace consolidation, pleural effusion, or pneumothorax. No cardiomegaly.No acute fracture or  destructive lesion. IMPRESSION: No acute cardiopulmonary abnormality. Electronically Signed   By: Rogelia Myers M.D.   On: 07/10/2024 10:32     Procedures   Medications Ordered in the ED  HYDROmorphone  (DILAUDID ) injection 0.5 mg (has no administration in time range)  ondansetron  (ZOFRAN ) injection 4 mg (has no administration in time range)                                    Medical Decision Making Amount and/or Complexity of Data Reviewed Labs: ordered. Radiology: ordered.  Risk Prescription drug management.   This  patient is a 38 y.o. female who presents to the ED for concern of bilateral upper flank and upper abdominal pain, this involves an extensive number of treatment options, and is a complaint that carries with it a high risk of complications and morbidity. The emergent differential diagnosis prior to evaluation includes, but is not limited to,  PUD, gastritis, pancreatitis, gastroparesis, malignancy, biliary disease, ACS, pericarditis, pneumonia, intestinal ischemia, esophageal rupture, hepatitis, pregnancy  This is not an exhaustive differential.   Past Medical History / Co-morbidities / Social History:  has a past medical history of Angio-edema, Asthma, Diverticulitis, Nausea & vomiting (04/02/2017), Pancreatitis, Pre-diabetes, Pyelonephritis, and Urticaria.  Patient with no pcp, spanish speaking requiring family member at bedside to interpret  Additional history: Chart reviewed. Pertinent results include: seen several times for similar symptoms previously, has had 5 Cts and a pelvic US  in the past 6 months that were grossly normal  Physical Exam: Physical exam performed. The pertinent findings include: Patient well-appearing, tenderness noted to the bilateral flank areas as well as the bilateral upper abdominal area. No rebound or guarding. Negative Murphy sign  Lab Tests: I ordered, and personally interpreted labs.  The pertinent results include:  K 3.0, UA with  ketones, noninfectious, 2 troponins negative, no other acute laboratory abnormalities   Imaging Studies: I ordered imaging studies including CXR. I independently visualized and interpreted imaging which showed NAD. I agree with the radiologist interpretation.   Cardiac Monitoring:  The patient was maintained on a cardiac monitor.  My attending physician viewed and interpreted the cardiac monitored which showed an underlying rhythm of: sinus rhythm, no STEMI. I agree with this interpretation.   Medications: I ordered medication including dilaudid , toradol , zofran , oral potassium, IV fluids  for pain, nausea, hypokalemia. Reevaluation of the patient after these medicines showed that the patient improved. I have reviewed the patients home medicines and have made adjustments as needed.  Disposition: After consideration of the diagnostic results and the patients response to treatment, I feel that emergency department workup does not suggest an emergent condition requiring admission or immediate intervention beyond what has been performed at this time. The plan is: Discharge with close outpatient follow-up and return precautions.  Will send Bentyl  and Zofran  for symptomatic improvement, emphasized importance of close outpatient GI follow-up.  After above interventions, she is able to eat and drink without any residual nausea or vomiting.  Given that her symptoms are similar in nature to previous ER visits and she has had 5 negative CT scans and her symptoms improved with above interventions, no further evaluation with imaging is indicated at this time.  Evaluation and diagnostic testing in the emergency department does not suggest an emergent condition requiring admission or immediate intervention beyond what has been performed at this time.  Plan for discharge with close PCP follow-up.  Patient is understanding and amenable with plan, educated on red flag symptoms that would prompt immediate return.  Patient  discharged in stable condition.  Final diagnoses:  Generalized abdominal pain    ED Discharge Orders          Ordered    dicyclomine  (BENTYL ) 20 MG tablet  2 times daily        07/10/24 1425    ondansetron  (ZOFRAN -ODT) 4 MG disintegrating tablet  Every 8 hours PRN        07/10/24 1425          An After Visit Summary was printed and given to the patient.  Nora Lauraine LABOR, PA-C 07/10/24 1427    Elnor Jayson LABOR, DO 07/16/24 [phone]

## 2024-07-10 NOTE — ED Triage Notes (Signed)
 Pt c/o left chest wall pain that radiates to her left abdomen upper and lower and radiates to her back. Pt reports n/v no diarrhea. Pt reports chills at home unsure of how high fever is did not check.

## 2024-07-10 NOTE — Discharge Instructions (Signed)
 As we discussed, your workup in the ER today was reassuring for acute findings.  Laboratory evaluation and imaging did not reveal any emergent cause of your symptoms.  If you have a new prescription for Zofran  you can take for nausea and vomiting and Bentyl  you can take for stomach cramps.  Follow-up with your PCP.  If you do not have 1, you can use the resources listed below.  Return if development of any new or worsening symptoms.  [facility] Primary Care Doctor List    Rollene Pesa, MD. Specialty: [facility] Medicine Contact information: [address]  [place] [address]  [phone]   Glendia Fielding, MD. Specialty: [facility] Medicine Contact information: [address]  [place] [address]  [phone]   Benita Outhouse, MD Specialty: Internal Medicine Contact information: [address] [place] [address]  [phone]   Darlyn Hurst, MD. Specialty: Internal Medicine Contact information: [address]  [place] [address]  [phone]    [facility] (Dr. Luke) Specialty: Family Medicine Contact information: [address]  [place] [address]  [phone]   Garnette Lolling, MD. Specialty: [facility] Medicine Contact information: [address]  [address]  [place] [address]  [phone]   Gaither Langton, MD. Specialty: Internal Medicine Contact information: [address]  [address]  [place] [address]  [phone]    [facility]  [address] [place], [place] [address] [phone]  Services [facility] offers a variety of basic health services.  Services include but are not limited to: Blood pressure checks  Heart rate checks  Blood sugar checks  Urine analysis  Rapid strep tests  Pregnancy tests.  Health education and referrals  People needing more complex services will be directed to a physician online. Using these virtual  visits, doctors can evaluate and prescribe medicine and treatments. There will be no medication on-site, though [facility] will help patients fill their prescriptions at little to no cost.   For More information please go to: [email or web address]

## 2024-07-10 NOTE — ED Notes (Signed)
 Provider notified of pt pain 7/10

## 2024-07-19 ENCOUNTER — Encounter (HOSPITAL_COMMUNITY): Payer: Self-pay

## 2024-07-19 ENCOUNTER — Emergency Department (HOSPITAL_COMMUNITY)
Admission: EM | Admit: 2024-07-19 | Discharge: 2024-07-19 | Disposition: A | Discharge status: Home / Self Care | Attending: Emergency Medicine | Admitting: Emergency Medicine

## 2024-07-19 ENCOUNTER — Emergency Department (HOSPITAL_COMMUNITY)

## 2024-07-19 ENCOUNTER — Other Ambulatory Visit: Payer: Self-pay

## 2024-07-19 DIAGNOSIS — R0789 Other chest pain: Secondary | ICD-10-CM | POA: Insufficient documentation

## 2024-07-19 DIAGNOSIS — Y92410 Unspecified street and highway as the place of occurrence of the external cause: Secondary | ICD-10-CM | POA: Diagnosis not present

## 2024-07-19 DIAGNOSIS — T07XXXA Unspecified multiple injuries, initial encounter: Secondary | ICD-10-CM

## 2024-07-19 DIAGNOSIS — S43402A Unspecified sprain of left shoulder joint, initial encounter: Secondary | ICD-10-CM | POA: Diagnosis present

## 2024-07-19 MED ORDER — NAPROXEN 250 MG PO TABS
500.0000 mg | ORAL_TABLET | Freq: Once | ORAL | Status: AC
Start: 2024-07-19 — End: 2024-07-19
  Administered 2024-07-19: 500 mg via ORAL
  Filled 2024-07-19 (×2): qty 2

## 2024-07-19 MED ORDER — IBUPROFEN 600 MG PO TABS: 600.0000 mg | ORAL_TABLET | Freq: Four times a day (QID) | ORAL | 0 refills | Status: DC | PRN

## 2024-07-19 MED ORDER — ACETAMINOPHEN ER 650 MG PO TBCR: 650.0000 mg | EXTENDED_RELEASE_TABLET | Freq: Three times a day (TID) | ORAL | 0 refills | Status: DC | PRN

## 2024-07-19 MED ORDER — METHOCARBAMOL 500 MG PO TABS: 500.0000 mg | ORAL_TABLET | Freq: Two times a day (BID) | ORAL | 0 refills | Status: DC

## 2024-07-19 NOTE — Discharge Instructions (Addendum)
 X-ray of the chest and ribs looks normal.  Please read the instructions provided.  Take ibuprofen  every 6 hours as needed for pain.  Start with ice for now, and then alternate between ice and heat over affected area of pain.  If your shoulder pain does not get better in the next 3 to 5 days, then call the orthopedic doctor at the number provided.
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
## 2024-08-13 ENCOUNTER — Emergency Department (HOSPITAL_COMMUNITY): Payer: Self-pay

## 2024-08-13 ENCOUNTER — Encounter (HOSPITAL_COMMUNITY): Payer: Self-pay

## 2024-08-13 ENCOUNTER — Emergency Department (HOSPITAL_COMMUNITY)
Admission: EM | Admit: 2024-08-13 | Discharge: 2024-08-13 | Disposition: A | Discharge status: Home / Self Care | Payer: Self-pay | Attending: Emergency Medicine | Admitting: Emergency Medicine

## 2024-08-13 DIAGNOSIS — M545 Low back pain, unspecified: Secondary | ICD-10-CM | POA: Insufficient documentation

## 2024-08-13 LAB — POC URINE PREG, ED: Preg Test, Ur: NEGATIVE

## 2024-08-13 MED ORDER — METHOCARBAMOL 500 MG PO TABS
500.0000 mg | ORAL_TABLET | Freq: Once | ORAL | Status: AC
Start: 2024-08-13 — End: 2024-08-13
  Administered 2024-08-13: 500 mg via ORAL
  Filled 2024-08-13: qty 1

## 2024-08-13 MED ORDER — METHOCARBAMOL 500 MG PO TABS: 500.0000 mg | ORAL_TABLET | Freq: Two times a day (BID) | ORAL | 0 refills | Status: AC

## 2024-08-13 MED ORDER — IBUPROFEN 600 MG PO TABS: 600.0000 mg | ORAL_TABLET | Freq: Three times a day (TID) | ORAL | 0 refills | Status: DC

## 2024-08-13 MED ORDER — IBUPROFEN 800 MG PO TABS
800.0000 mg | ORAL_TABLET | Freq: Once | ORAL | Status: AC
  Administered 2024-08-13: 800 mg via ORAL
  Filled 2024-08-13: qty 1
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
## 2024-08-13 NOTE — Discharge Instructions (Signed)
 Take the medications prescribed, also recommend a heating pad applied to your lower back for 20 minutes 3-4 times daily.  Get rechecked if you develop any weakness or numbness in your legs or you develop inability to control your bowels or bladder.  The symptoms are potentially signs of a worsening condition.

## 2024-08-17 DIAGNOSIS — N939 Abnormal uterine and vaginal bleeding, unspecified: Secondary | ICD-10-CM | POA: Insufficient documentation

## 2024-08-17 DIAGNOSIS — R1024 Suprapubic pain: Secondary | ICD-10-CM | POA: Insufficient documentation

## 2024-08-18 ENCOUNTER — Other Ambulatory Visit: Payer: Self-pay

## 2024-08-18 ENCOUNTER — Encounter (HOSPITAL_COMMUNITY): Payer: Self-pay | Admitting: Emergency Medicine

## 2024-08-18 ENCOUNTER — Emergency Department (HOSPITAL_COMMUNITY)
Admission: EM | Admit: 2024-08-18 | Discharge: 2024-08-18 | Disposition: A | Payer: Self-pay | Attending: Emergency Medicine | Admitting: Emergency Medicine

## 2024-08-18 DIAGNOSIS — N938 Other specified abnormal uterine and vaginal bleeding: Secondary | ICD-10-CM

## 2024-08-18 LAB — CBC WITH DIFFERENTIAL/PLATELET
Abs Immature Granulocytes: 0.02 K/uL (ref 0.00–0.07)
Basophils Absolute: 0.1 K/uL (ref 0.0–0.1)
Basophils Relative: 1 %
Eosinophils Absolute: 0.3 K/uL (ref 0.0–0.5)
Eosinophils Relative: 4 %
HCT: 37.6 % (ref 36.0–46.0)
Hemoglobin: 12.6 g/dL (ref 12.0–15.0)
Immature Granulocytes: 0 %
Lymphocytes Relative: 35 %
Lymphs Abs: 2.8 K/uL (ref 0.7–4.0)
MCH: 30.3 pg (ref 26.0–34.0)
MCHC: 33.5 g/dL (ref 30.0–36.0)
MCV: 90.4 fL (ref 80.0–100.0)
Monocytes Absolute: 0.5 K/uL (ref 0.1–1.0)
Monocytes Relative: 6 %
Neutro Abs: 4.3 K/uL (ref 1.7–7.7)
Neutrophils Relative %: 54 %
Platelets: 271 K/uL (ref 150–400)
RBC: 4.16 MIL/uL (ref 3.87–5.11)
RDW: 12.9 % (ref 11.5–15.5)
WBC: 7.9 K/uL (ref 4.0–10.5)
nRBC: 0 % (ref 0.0–0.2)

## 2024-08-18 LAB — URINALYSIS, ROUTINE W REFLEX MICROSCOPIC
Bacteria, UA: NONE SEEN
Bilirubin Urine: NEGATIVE
Glucose, UA: NEGATIVE mg/dL
Ketones, ur: NEGATIVE mg/dL
Leukocytes,Ua: NEGATIVE
Nitrite: NEGATIVE
Protein, ur: 100 mg/dL — AB
RBC / HPF: >50 RBC/hpf (ref 0–5)
Specific Gravity, Urine: 1.023 (ref 1.005–1.030)
pH: 6 (ref 5.0–8.0)

## 2024-08-18 LAB — POC URINE PREG, ED: Preg Test, Ur: NEGATIVE

## 2024-08-18 MED ORDER — MEDROXYPROGESTERONE ACETATE 10 MG PO TABS: 5.0000 mg | ORAL_TABLET | Freq: Every day | ORAL | 0 refills | Status: DC

## 2024-08-18 NOTE — ED Triage Notes (Signed)
 Pt c/o vaginal bleeding with clots and abd cramping since 08/08/24.

## 2024-08-18 NOTE — ED Notes (Signed)
 Pt has been on menstrual cycle since 08/08/2024- pt having cramping and bleeding-

## 2024-08-18 NOTE — Discharge Instructions (Addendum)
 If your bleeding is not improving in the next 1 to 2 days, fill the prescription for Provera you have been given this evening and begin taking this medication as prescribed.  Return tomorrow for an ultrasound at the given time.  Follow-up with a gynecologist in the next week.  The contact information for [facility] has been provided in this discharge summary for you to call and make these arrangements.

## 2024-08-18 NOTE — ED Provider Notes (Signed)
 [place] EMERGENCY DEPARTMENT AT [facility] Provider Note   CSN: [id number] Arrival date & time: 08/17/24  2357     Patient presents with: Vaginal Bleeding   Lorraine [name] is a 38 y.o. female.   Patient is a 38 year old female with no significant past medical history.  Patient presenting today with vaginal bleeding.  She started her period on 22 November, but continues to bleed 10 days later.  She tells me her period normally last 3 or 4 days and this is very unusual.  She describes some discomfort across her lower abdomen.  No fevers or chills.  She doubts the possibility of pregnancy.  Of note is that this patient does not speak English, only Spanish.  History taken with the use of the translator tablet.       Prior to Admission medications   Medication Sig Start Date End Date Taking? Authorizing Provider  acetaminophen  (TYLENOL  8 HOUR) 650 MG CR tablet Take 1 tablet (650 mg total) by mouth every 8 (eight) hours as needed for pain or fever. 07/19/24   Charlyn Sora, MD  albuterol  (VENTOLIN  HFA) 108 (90 Base) MCG/ACT inhaler Inhale 2 puffs into the lungs every 4 (four) hours as needed for wheezing or shortness of breath. 08/25/22   Suzette Pac, MD  ciprofloxacin  (CIPRO ) 500 MG tablet Take 1 tablet (500 mg total) by mouth every 12 (twelve) hours. 06/05/24   Raford Lenis, MD  dicyclomine  (BENTYL ) 20 MG tablet Take 1 tablet (20 mg total) by mouth 2 (two) times daily. 07/10/24   Smoot, Lauraine LABOR, PA-C  ibuprofen  (ADVIL ) 600 MG tablet Take 1 tablet (600 mg total) by mouth 3 (three) times daily. 08/13/24   Idol, Julie, PA-C  methocarbamol  (ROBAXIN ) 500 MG tablet Take 1 tablet (500 mg total) by mouth 2 (two) times daily. 08/13/24   Idol, Julie, PA-C  ondansetron  (ZOFRAN ) 4 MG tablet Take 1 tablet (4 mg total) by mouth every 6 (six) hours. 02/19/24   Myriam Dorn BROCKS, PA  ondansetron  (ZOFRAN -ODT) 4 MG disintegrating tablet Take 1 tablet (4 mg total) by mouth every 8 (eight)  hours as needed for nausea or vomiting. 07/10/24   Smoot, Lauraine LABOR, PA-C  oxyCODONE  (ROXICODONE ) 5 MG immediate release tablet Take 1 tablet (5 mg total) by mouth every 4 (four) hours as needed for severe pain (pain score 7-10). 06/05/24   Raford Lenis, MD  pantoprazole  (PROTONIX ) 40 MG tablet Take 1 tablet (40 mg total) by mouth daily. 06/09/24   Suellen Cantor A, PA-C  potassium chloride  SA (KLOR-CON  M) 20 MEQ tablet Take 1 tablet (20 mEq total) by mouth 2 (two) times daily. 06/05/24   Raford Lenis, MD  cetirizine  (ZYRTEC  ALLERGY) 10 MG tablet Take 1 tablet (10 mg total) by mouth 2 (two) times daily. Patient not taking: Reported on 12/06/2020 11/12/20 01/24/21  Muthersbaugh, Chiquita, PA-C    Allergies: Iodinated contrast media, Lupine bean extract, Ceftriaxone , and Morphine     Review of Systems  All other systems reviewed and are negative.   Updated Vital Signs BP 132/86 (BP Location: Right Arm)   Pulse 73   Temp 98.3 F (36.8 C) (Oral)   Resp 17   Ht 5' (1.524 m)   Wt 81.2 kg   LMP 08/08/2024 (Approximate)   SpO2 100%   BMI 34.96 kg/m   Physical Exam Vitals and nursing note reviewed.  Constitutional:      General: She is not in acute distress.    Appearance: She is well-developed.  She is not diaphoretic.  HENT:     Head: Normocephalic and atraumatic.  Cardiovascular:     Rate and Rhythm: Normal rate and regular rhythm.     Heart sounds: No murmur heard.    No friction rub. No gallop.  Pulmonary:     Effort: Pulmonary effort is normal. No respiratory distress.     Breath sounds: Normal breath sounds. No wheezing.  Abdominal:     General: Bowel sounds are normal. There is no distension.     Palpations: Abdomen is soft.     Tenderness: There is no abdominal tenderness.     Comments: There is mild suprapubic tenderness.  Musculoskeletal:        General: Normal range of motion.     Cervical back: Normal range of motion and neck supple.  Skin:    General: Skin is warm and dry.   Neurological:     General: No focal deficit present.     Mental Status: She is alert and oriented to person, place, and time.     (all labs ordered are listed, but only abnormal results are displayed) Labs Reviewed  URINALYSIS, ROUTINE W REFLEX MICROSCOPIC - Abnormal; Notable for the following components:      Result Value   Color, Urine AMBER (*)    APPearance HAZY (*)    Hgb urine dipstick LARGE (*)    Protein, ur 100 (*)    All other components within normal limits  CBC WITH DIFFERENTIAL/PLATELET  POC URINE PREG, ED    EKG: None  Radiology: No results found.   Procedures   Medications Ordered in the ED - No data to display                                  Medical Decision Making Amount and/or Complexity of Data Reviewed Labs: ordered.   Patient presenting with vaginal bleeding as described in the HPI.  She states that her period has been ongoing and lasted for 10 days.  She arrives here with stable vital signs and is afebrile.  Physical examination reveals mild tenderness to the lower abdomen, but is otherwise benign.  Laboratory studies obtained including CBC.  Hemoglobin is 12.6 and there is no leukocytosis.  Urinalysis just shows blood, likely contamination and pregnancy test is negative.  Cause of patient's bleeding unknown, but will arrange for an ultrasound as an outpatient as they are not here at night.  This will help rule out ovarian cyst or uterine fibroid.  Patient's history is not consistent with torsion.  I will also prescribe Provera which she can begin taking if her bleeding has not resolved in the next 1 to 2 days.     Final diagnoses:  None    ED Discharge Orders     None          Geroldine Berg, MD 08/18/24 [phone]

## 2024-08-19 ENCOUNTER — Emergency Department (HOSPITAL_COMMUNITY)
Admission: EM | Admit: 2024-08-19 | Discharge: 2024-08-19 | Disposition: A | Discharge status: Home / Self Care | Payer: Self-pay | Attending: Emergency Medicine | Admitting: Emergency Medicine

## 2024-08-19 ENCOUNTER — Other Ambulatory Visit: Payer: Self-pay

## 2024-08-19 ENCOUNTER — Emergency Department (HOSPITAL_COMMUNITY): Payer: Self-pay

## 2024-08-19 DIAGNOSIS — N939 Abnormal uterine and vaginal bleeding, unspecified: Secondary | ICD-10-CM | POA: Insufficient documentation

## 2024-08-19 LAB — CBC
HCT: 34.9 % — ABNORMAL LOW (ref 36.0–46.0)
Hemoglobin: 11.8 g/dL — ABNORMAL LOW (ref 12.0–15.0)
MCH: 30.6 pg (ref 26.0–34.0)
MCHC: 33.8 g/dL (ref 30.0–36.0)
MCV: 90.4 fL (ref 80.0–100.0)
Platelets: 271 K/uL (ref 150–400)
RBC: 3.86 MIL/uL — ABNORMAL LOW (ref 3.87–5.11)
RDW: 12.8 % (ref 11.5–15.5)
WBC: 8.8 K/uL (ref 4.0–10.5)
nRBC: 0 % (ref 0.0–0.2)

## 2024-08-19 LAB — URINALYSIS, ROUTINE W REFLEX MICROSCOPIC
Bilirubin Urine: NEGATIVE
Glucose, UA: NEGATIVE mg/dL
Ketones, ur: NEGATIVE mg/dL
Leukocytes,Ua: NEGATIVE
Nitrite: NEGATIVE
Protein, ur: 30 mg/dL — AB
RBC / HPF: >50 RBC/hpf (ref 0–5)
Specific Gravity, Urine: 1.02 (ref 1.005–1.030)
pH: 6 (ref 5.0–8.0)

## 2024-08-19 LAB — COMPREHENSIVE METABOLIC PANEL WITH GFR
ALT: 36 U/L (ref 0–44)
AST: 30 U/L (ref 15–41)
Albumin: 4 g/dL (ref 3.5–5.0)
Alkaline Phosphatase: 128 U/L — ABNORMAL HIGH (ref 38–126)
Anion gap: 10 (ref 5–15)
BUN: 14 mg/dL (ref 6–20)
CO2: 25 mmol/L (ref 22–32)
Calcium: 9 mg/dL (ref 8.9–10.3)
Chloride: 105 mmol/L (ref 98–111)
Creatinine, Ser: 0.68 mg/dL (ref 0.44–1.00)
GFR, Estimated: >60 mL/min (ref >60)
Glucose, Bld: 103 mg/dL — ABNORMAL HIGH (ref 70–99)
Potassium: 3.2 mmol/L — ABNORMAL LOW (ref 3.5–5.1)
Sodium: 141 mmol/L (ref 135–145)
Total Bilirubin: <0.2 mg/dL (ref 0.0–1.2)
Total Protein: 6.8 g/dL (ref 6.5–8.1)

## 2024-08-19 LAB — HCG, SERUM, QUALITATIVE: Preg, Serum: NEGATIVE

## 2024-08-19 LAB — LIPASE, BLOOD: Lipase: 34 U/L (ref 11–51)

## 2024-08-19 MED ORDER — ONDANSETRON 4 MG PO TBDP: 4.0000 mg | ORAL_TABLET | Freq: Three times a day (TID) | ORAL | 0 refills | Status: DC | PRN

## 2024-08-19 MED ORDER — IBUPROFEN 800 MG PO TABS
800.0000 mg | ORAL_TABLET | Freq: Once | ORAL | Status: AC
  Administered 2024-08-19: 800 mg via ORAL
  Filled 2024-08-19: qty 1

## 2024-08-19 MED ORDER — IBUPROFEN 800 MG PO TABS: 800.0000 mg | ORAL_TABLET | Freq: Three times a day (TID) | ORAL | 0 refills | Status: DC

## 2024-08-19 MED ORDER — POTASSIUM CHLORIDE CRYS ER 20 MEQ PO TBCR
40.0000 meq | EXTENDED_RELEASE_TABLET | Freq: Once | ORAL | Status: AC
  Administered 2024-08-19: 40 meq via ORAL
  Filled 2024-08-19: qty 2

## 2024-08-19 MED ORDER — ONDANSETRON 4 MG PO TBDP
4.0000 mg | ORAL_TABLET | Freq: Once | ORAL | Status: AC
  Administered 2024-08-19: 4 mg via ORAL
  Filled 2024-08-19: qty 1

## 2024-08-19 NOTE — Discharge Instructions (Signed)
 Fue un engineer, manufacturing systems. Fue atendida en Urgencias para una evaluacin de sangrado vaginal. Su evaluacin fue tranquilizadora. Sus anlisis de laboratorio y hydrologist no teacher, adult education. No se observaron anomalas. Su hemoglobina se mantiene estable en 11.8. Consulte la documentacin adjunta para obtener ms informacin sobre el manejo de sus sntomas. Como ya se mencion, la derivar a chief of staff para que le realicen ms metlife. Deber llamarlo para programar una cita. Su informacin de contacto se encuentra en su documentacin de alta.  Por favor, regrese a Urgencias si presenta dolor en el pecho, dificultad para respirar, nuseas o vmitos persistentes o cualquier otra enfermedad potencialmente mortal.  It was a pleasure taking care of you today. You were seen in the Emergency Department for evaluation of vaginal bleeding. Your work-up was reassuring. Your labs and ultrasound were unremarkable.  There were no abnormalities noted.  Your hemoglobin is stable at 11.8. Refer to the attached documentation for further management of your symptoms.  As discussed, I am giving you a referral to OB/GYN for further workup.  You will need to call them to schedule an appointment.  Their contact information is provided on your discharge paperwork.  Please return to the ER if you experience chest pain, trouble breathing, intractable nausea/vomiting or any other life threatening illnesses.

## 2024-08-19 NOTE — ED Triage Notes (Signed)
 Pt c/o vaginal bleeding w/clots & abd cramping  since her menstrual cycle on 08/08/24. Pt states she bleeds through sanitary pads very rapidly uses x20 daily. States she feels tired, nauseous, & dizziness. No c/o SOB.

## 2024-08-19 NOTE — ED Provider Notes (Signed)
 [place] EMERGENCY DEPARTMENT AT [facility] Provider Note   CSN: [id number] Arrival date & time: 08/19/24  9089     Patient presents with: Vaginal Bleeding (abdominal) and Abdominal Pain   Lorraine [name] is a 38 y.o. female with no pertinent past medical history who presents emergency department for evaluation of abdominal pain and vaginal bleeding.  Patient reports her vaginal bleeding began on 11/22 and has continued since.  She states her normal menstrual cycles are 3 to 4 days.  Patient reports that since Saturday 11/29 she has been passing clots as well.  Patient reports she soaked through 20 pads yesterday.  Patient is endorsing abdominal cramping and nausea as well.  She is denying any vomiting.  She denies recent sexual activity, denies concern for STIs and is not currently on any birth control.  Of note, patient was evaluated in the emergency department on 12/2 with recommendation to obtain an outpatient ultrasound, but she states she did not do this.    Vaginal Bleeding Associated symptoms: abdominal pain   Abdominal Pain Associated symptoms: vaginal bleeding        Prior to Admission medications   Medication Sig Start Date End Date Taking? Authorizing Provider  ibuprofen  (ADVIL ) 800 MG tablet Take 1 tablet (800 mg total) by mouth 3 (three) times daily. 08/19/24  Yes Valia Wingard, Marry RAMAN, PA-C  ondansetron  (ZOFRAN -ODT) 4 MG disintegrating tablet Take 1 tablet (4 mg total) by mouth every 8 (eight) hours as needed for nausea or vomiting. 08/19/24  Yes Marra Fraga, Marry RAMAN, PA-C  acetaminophen  (TYLENOL  8 HOUR) 650 MG CR tablet Take 1 tablet (650 mg total) by mouth every 8 (eight) hours as needed for pain or fever. 07/19/24   Charlyn Sora, MD  albuterol  (VENTOLIN  HFA) 108 (90 Base) MCG/ACT inhaler Inhale 2 puffs into the lungs every 4 (four) hours as needed for wheezing or shortness of breath. 08/25/22   Suzette Pac, MD  ciprofloxacin  (CIPRO ) 500 MG tablet Take 1  tablet (500 mg total) by mouth every 12 (twelve) hours. 06/05/24   Raford Lenis, MD  dicyclomine  (BENTYL ) 20 MG tablet Take 1 tablet (20 mg total) by mouth 2 (two) times daily. 07/10/24   Smoot, Lauraine LABOR, PA-C  medroxyPROGESTERone (PROVERA) 10 MG tablet Take 0.5 tablets (5 mg total) by mouth daily. 08/18/24   Geroldine Berg, MD  methocarbamol  (ROBAXIN ) 500 MG tablet Take 1 tablet (500 mg total) by mouth 2 (two) times daily. 08/13/24   Idol, Julie, PA-C  ondansetron  (ZOFRAN ) 4 MG tablet Take 1 tablet (4 mg total) by mouth every 6 (six) hours. 02/19/24   Myriam Dorn BROCKS, PA  oxyCODONE  (ROXICODONE ) 5 MG immediate release tablet Take 1 tablet (5 mg total) by mouth every 4 (four) hours as needed for severe pain (pain score 7-10). 06/05/24   Raford Lenis, MD  pantoprazole  (PROTONIX ) 40 MG tablet Take 1 tablet (40 mg total) by mouth daily. 06/09/24   Suellen Cantor A, PA-C  potassium chloride  SA (KLOR-CON  M) 20 MEQ tablet Take 1 tablet (20 mEq total) by mouth 2 (two) times daily. 06/05/24   Raford Lenis, MD  cetirizine  (ZYRTEC  ALLERGY) 10 MG tablet Take 1 tablet (10 mg total) by mouth 2 (two) times daily. Patient not taking: Reported on 12/06/2020 11/12/20 01/24/21  Muthersbaugh, Chiquita, PA-C    Allergies: Iodinated contrast media, Lupine bean extract, Ceftriaxone , and Morphine     Review of Systems  Gastrointestinal:  Positive for abdominal pain.  Genitourinary:  Positive for vaginal bleeding.  Updated Vital Signs BP (!) 152/98 (BP Location: Left Arm)   Pulse (!) 107   Temp (!) 97.4 F (36.3 C) (Oral)   Resp 18   Ht 5' (1.524 m)   Wt 81.2 kg   LMP 08/08/2024 (Approximate)   SpO2 100%   BMI 34.96 kg/m   Physical Exam Vitals and nursing note reviewed.  Constitutional:      Appearance: Normal appearance.  HENT:     Head: Normocephalic and atraumatic.     Mouth/Throat:     Mouth: Mucous membranes are moist.  Eyes:     General: No scleral icterus.       Right eye: No discharge.        Left  eye: No discharge.     Conjunctiva/sclera: Conjunctivae normal.  Cardiovascular:     Rate and Rhythm: Normal rate and regular rhythm.     Pulses: Normal pulses.  Pulmonary:     Effort: Pulmonary effort is normal.     Breath sounds: Normal breath sounds.  Abdominal:     General: There is no distension.     Tenderness: There is abdominal tenderness in the right lower quadrant.     Comments: RLQ tenderness to palpation.  Genitourinary:    Vagina: Bleeding present.     Comments: Pelvic exam with bright red blood in vaginal vault.  2 clots approximately the size of a quarter were removed.  Patient tolerated exam without difficulty.  No pain with exam. Musculoskeletal:        General: No deformity.     Cervical back: Normal range of motion.  Skin:    General: Skin is warm and dry.     Capillary Refill: Capillary refill takes less than 2 seconds.  Neurological:     Mental Status: She is alert.     Motor: No weakness.  Psychiatric:        Mood and Affect: Mood normal.     (all labs ordered are listed, but only abnormal results are displayed) Labs Reviewed  COMPREHENSIVE METABOLIC PANEL WITH GFR - Abnormal; Notable for the following components:      Result Value   Potassium 3.2 (*)    Glucose, Bld 103 (*)    Alkaline Phosphatase 128 (*)    All other components within normal limits  CBC - Abnormal; Notable for the following components:   RBC 3.86 (*)    Hemoglobin 11.8 (*)    HCT 34.9 (*)    All other components within normal limits  URINALYSIS, ROUTINE W REFLEX MICROSCOPIC - Abnormal; Notable for the following components:   Color, Urine AMBER (*)    APPearance HAZY (*)    Hgb urine dipstick LARGE (*)    Protein, ur 30 (*)    Bacteria, UA RARE (*)    All other components within normal limits  LIPASE, BLOOD  HCG, SERUM, QUALITATIVE    EKG: None  Radiology: US  Pelvis Complete Result Date: 08/19/2024 EXAM: US  Pelvis, Complete Transvaginal and Transabdominal with Doppler  08/19/2024 10:49:26 AM TECHNIQUE: Transabdominal and transvaginal pelvic duplex ultrasound using B-mode/gray scaled imaging with Doppler spectral analysis and color flow was obtained. COMPARISON: CT 06/05/2024 CLINICAL HISTORY: Vaginal bleeding with dizziness and lightheadedness. FINDINGS: UTERUS: Uterus is normal in size, shape, and position and measures 8.0 x 4.1 x 5.3 cm with volume 90.5 ml. Uterus demonstrates normal myometrial echotexture. ENDOMETRIAL STRIPE: Endometrial thickness is 7.2 mm. RIGHT OVARY: Right ovary measures 2.6 x 1.6 x 2.2 cm with volume 4.7 ml.  Right ovary is within normal limits. There is normal arterial and venous Doppler flow. LEFT OVARY: Left ovary measures 2.9 x 3.1 x 1.8 cm with volume 8.6 ml. Left ovary is within normal limits. There is normal arterial and venous Doppler flow. FREE FLUID: No free fluid. IMPRESSION: 1. Normal uterus and ovaries. No acute findings. Electronically signed by: Toribio Agreste MD 08/19/2024 11:16 AM EST RP Workstation: [id number]   US  Art/Ven Flow Abd Pelv Doppler Result Date: 08/19/2024 EXAM: US  Pelvis, Complete Transvaginal and Transabdominal with Doppler 08/19/2024 10:49:26 AM TECHNIQUE: Transabdominal and transvaginal pelvic duplex ultrasound using B-mode/gray scaled imaging with Doppler spectral analysis and color flow was obtained. COMPARISON: CT 06/05/2024 CLINICAL HISTORY: Vaginal bleeding with dizziness and lightheadedness. FINDINGS: UTERUS: Uterus is normal in size, shape, and position and measures 8.0 x 4.1 x 5.3 cm with volume 90.5 ml. Uterus demonstrates normal myometrial echotexture. ENDOMETRIAL STRIPE: Endometrial thickness is 7.2 mm. RIGHT OVARY: Right ovary measures 2.6 x 1.6 x 2.2 cm with volume 4.7 ml. Right ovary is within normal limits. There is normal arterial and venous Doppler flow. LEFT OVARY: Left ovary measures 2.9 x 3.1 x 1.8 cm with volume 8.6 ml. Left ovary is within normal limits. There is normal arterial and venous Doppler  flow. FREE FLUID: No free fluid. IMPRESSION: 1. Normal uterus and ovaries. No acute findings. Electronically signed by: Toribio Agreste MD 08/19/2024 11:16 AM EST RP Workstation: [id number]     .Pelvic exam  Date/Time: 08/19/2024 12:36 PM  Performed by: Torrence Marry RAMAN, PA-C Authorized by: Torrence Marry RAMAN, PA-C  Consent: Verbal consent obtained Consent given by: patient Patient understanding: patient states understanding of the procedure being performed Imaging studies: imaging studies available Patient identity confirmed: verbally with patient Local anesthesia used: no  Anesthesia: Local anesthesia used: no  Sedation: Patient sedated: no  Patient tolerance: patient tolerated the procedure well with no immediate complications      Medications Ordered in the ED  potassium chloride  SA (KLOR-CON  M) CR tablet 40 mEq (has no administration in time range)  ondansetron  (ZOFRAN -ODT) disintegrating tablet 4 mg (4 mg Oral Given 08/19/24 1112)  ibuprofen  (ADVIL ) tablet 800 mg (800 mg Oral Given 08/19/24 1112)                                 Medical Decision Making Amount and/or Complexity of Data Reviewed Labs: ordered. Radiology: ordered.  Risk Prescription drug management.   This patient presents to the ED for concern of vaginal bleeding, this involves an extensive number of treatment options, and is a complaint that carries with it a high risk of complications and morbidity.   Differential diagnosis includes: Pregnancy, spontaneous abortion, ectopic pregnancy, normal menses, ovarian torsion  Co morbidities:  none  Social Determinants of Health:   non-English-speaking  Additional history: Patient was recently seen in the emergency department for the same concern, most recently on the night of 12/2.  She was given a recommendation to obtain an outpatient ultrasound.  However, she states she was unable to do this.  Lab Tests:  I Ordered, and personally interpreted  labs.  The pertinent results include:   - Potassium: 3.2  Imaging Studies:  I ordered imaging studies including pelvic ultrasound I independently visualized and interpreted imaging which showed no acute uterus or ovary abnormality noted I agree with the radiologist interpretation  Cardiac Monitoring/ECG:  The patient was maintained on a cardiac monitor.  I personally  viewed and interpreted the cardiac monitored which showed an underlying rhythm of: [place] rhythm  Medicines ordered and prescription drug management:  I ordered medication including  Medications  potassium chloride  SA (KLOR-CON  M) CR tablet 40 mEq (has no administration in time range)  ondansetron  (ZOFRAN -ODT) disintegrating tablet 4 mg (4 mg Oral Given 08/19/24 1112)  ibuprofen  (ADVIL ) tablet 800 mg (800 mg Oral Given 08/19/24 1112)   for pain and nausea Reevaluation of the patient after these medicines showed that the patient improved I have reviewed the patients home medicines and have made adjustments as needed  Test Considered:   none  Critical Interventions:   none  Consultations Obtained: None  Problem List / ED Course:     ICD-10-CM   1. Abnormal vaginal bleeding  N93.9       MDM: 38 year old female who presents emergency department for evaluation of vaginal bleeding.  Patient reports vaginal bleeding since 11/22 with the passage of bright red clots.  She was evaluated on 12/2 for the same complaint and advised to follow-up for an outpatient ultrasound.  At that time, her labs were stable.  However, today, patient is also reporting abdominal cramping, nausea and lightheadedness without LOC.  On physical exam, patient is tender to the left lower quadrant.  I reordered basic labs which show no acute abnormality.  Patient's hemoglobin is stable at 11.8.  I also ordered a pelvic ultrasound which shows no acute abnormality noted to the uterus or ovaries.  I gave patient Advil  and Zofran  for her pain and  nausea.  Upon reassessment, patient reports her nausea has mildly improved.  I did also perform a pelvic exam which revealed bright red blood in the vaginal vault as well as 2 clots.  Please see procedure note for further detail.  At this time, patient's workup has been reassuring.  I have provided a referral for her to see OB/GYN as she does not currently have OB/GYN care at this time.  I did also provide her with a prescription for Advil  and Zofran .  Patient verbalized her understanding to this.  Vital signs are stable.  Patient is appropriate for discharge at this time.   Dispostion:  After consideration of the diagnostic results and the patients response to treatment, I feel that the patient would benefit from OB/GYN follow-up.   Final diagnoses:  Abnormal vaginal bleeding    ED Discharge Orders          Ordered    ibuprofen  (ADVIL ) 800 MG tablet  3 times daily        08/19/24 1238    ondansetron  (ZOFRAN -ODT) 4 MG disintegrating tablet  Every 8 hours PRN        08/19/24 1238               Makia Bossi, Marry GORMAN RIGGERS 08/19/24 1244    Dasie Faden, MD 08/20/24 [phone]

## 2024-08-22 ENCOUNTER — Other Ambulatory Visit: Payer: Self-pay

## 2024-08-22 ENCOUNTER — Encounter (HOSPITAL_COMMUNITY): Payer: Self-pay | Admitting: Pharmacy Technician

## 2024-08-22 ENCOUNTER — Emergency Department (HOSPITAL_COMMUNITY)
Admission: EM | Admit: 2024-08-22 | Discharge: 2024-08-22 | Disposition: A | Discharge status: Home / Self Care | Payer: Self-pay

## 2024-08-22 DIAGNOSIS — N938 Other specified abnormal uterine and vaginal bleeding: Secondary | ICD-10-CM | POA: Insufficient documentation

## 2024-08-22 LAB — CBC WITH DIFFERENTIAL/PLATELET
Abs Immature Granulocytes: 0.04 K/uL (ref 0.00–0.07)
Basophils Absolute: 0.1 K/uL (ref 0.0–0.1)
Basophils Relative: 1 %
Eosinophils Absolute: 0.5 K/uL (ref 0.0–0.5)
Eosinophils Relative: 5 %
HCT: 36.2 % (ref 36.0–46.0)
Hemoglobin: 12.4 g/dL (ref 12.0–15.0)
Immature Granulocytes: 0 %
Lymphocytes Relative: 34 %
Lymphs Abs: 3.7 K/uL (ref 0.7–4.0)
MCH: 30.4 pg (ref 26.0–34.0)
MCHC: 34.3 g/dL (ref 30.0–36.0)
MCV: 88.7 fL (ref 80.0–100.0)
Monocytes Absolute: 0.5 K/uL (ref 0.1–1.0)
Monocytes Relative: 5 %
Neutro Abs: 6.1 K/uL (ref 1.7–7.7)
Neutrophils Relative %: 55 %
Platelets: 306 K/uL (ref 150–400)
RBC: 4.08 MIL/uL (ref 3.87–5.11)
RDW: 12.8 % (ref 11.5–15.5)
WBC: 11 K/uL — ABNORMAL HIGH (ref 4.0–10.5)
nRBC: 0 % (ref 0.0–0.2)

## 2024-08-22 LAB — URINALYSIS, ROUTINE W REFLEX MICROSCOPIC
Bilirubin Urine: NEGATIVE
Glucose, UA: NEGATIVE mg/dL
Ketones, ur: NEGATIVE mg/dL
Nitrite: NEGATIVE
Protein, ur: NEGATIVE mg/dL
RBC / HPF: >50 RBC/hpf (ref 0–5)
Specific Gravity, Urine: 1.019 (ref 1.005–1.030)
pH: 6 (ref 5.0–8.0)

## 2024-08-22 LAB — HCG, SERUM, QUALITATIVE: Preg, Serum: NEGATIVE

## 2024-08-22 MED ORDER — MEGESTROL ACETATE 40 MG PO TABS: 40.0000 mg | ORAL_TABLET | Freq: Every day | ORAL | 0 refills | Status: AC

## 2024-08-22 MED ORDER — MEGESTROL ACETATE 40 MG PO TABS
40.0000 mg | ORAL_TABLET | Freq: Once | ORAL | Status: AC
  Administered 2024-08-22: 40 mg via ORAL
  Filled 2024-08-22: qty 1

## 2024-08-22 NOTE — ED Triage Notes (Signed)
 PT states vaginal bleeding x 2 weeks.  Uses over 10 pads per day.  Was seen at another [facility] facility on 12/3 and, per pt, was told she needs a blood transfusion.

## 2024-08-22 NOTE — Discharge Instructions (Signed)
 Please take the Megace  as prescribed.  Take this once daily.  If you are still having bleeding then you may take this twice daily.  I did reach out to one of our OB/GYN's on-call and they should call next week to help schedule you a follow-up appointment.  If you do not hear from them by Tuesday or Wednesday please call the number provided.  Return to the ER for worsening symptoms or if you are bleeding and having to use more than 1 pad per hour.

## 2024-08-22 NOTE — ED Provider Notes (Signed)
 [place] EMERGENCY DEPARTMENT AT [facility] Provider Note   CSN: [id number] Arrival date & time: 08/22/24  1437     Patient presents with: Vaginal Bleeding   Lorraine [name] is a 38 y.o. female.   38 year old female with no reported past medical history presenting to the emergency department today with vaginal bleeding.  The patient has been having bleeding now over the past few weeks.  She reports she has been using 9-10 pads per day.  She reports that she is having some lower abdominal/suprapubic cramping.  Patient has been seen here in the emergency department twice for this.  She was unaware that she had a prescription for Provera  sent to her pharmacy and states that she has been taking ibuprofen  for cramping.  She denies any chest pain, shortness of breath, or lightheadedness.  Spanish interpreter services used for interview   Vaginal Bleeding      Prior to Admission medications   Medication Sig Start Date End Date Taking? Authorizing Provider  megestrol  (MEGACE ) 40 MG tablet Take 1 tablet (40 mg total) by mouth daily. 08/22/24  Yes Ula Prentice SAUNDERS, MD  acetaminophen  (TYLENOL  8 HOUR) 650 MG CR tablet Take 1 tablet (650 mg total) by mouth every 8 (eight) hours as needed for pain or fever. 07/19/24   Charlyn Sora, MD  albuterol  (VENTOLIN  HFA) 108 (90 Base) MCG/ACT inhaler Inhale 2 puffs into the lungs every 4 (four) hours as needed for wheezing or shortness of breath. 08/25/22   Suzette Pac, MD  ciprofloxacin  (CIPRO ) 500 MG tablet Take 1 tablet (500 mg total) by mouth every 12 (twelve) hours. 06/05/24   Raford Lenis, MD  dicyclomine  (BENTYL ) 20 MG tablet Take 1 tablet (20 mg total) by mouth 2 (two) times daily. 07/10/24   Smoot, Lauraine LABOR, PA-C  ibuprofen  (ADVIL ) 800 MG tablet Take 1 tablet (800 mg total) by mouth 3 (three) times daily. 08/19/24   Dufour, Marry RAMAN, PA-C  methocarbamol  (ROBAXIN ) 500 MG tablet Take 1 tablet (500 mg total) by mouth 2 (two) times daily.  08/13/24   Idol, Julie, PA-C  ondansetron  (ZOFRAN ) 4 MG tablet Take 1 tablet (4 mg total) by mouth every 6 (six) hours. 02/19/24   Myriam Dorn BROCKS, PA  ondansetron  (ZOFRAN -ODT) 4 MG disintegrating tablet Take 1 tablet (4 mg total) by mouth every 8 (eight) hours as needed for nausea or vomiting. 08/19/24   Dufour, Marry RAMAN, PA-C  oxyCODONE  (ROXICODONE ) 5 MG immediate release tablet Take 1 tablet (5 mg total) by mouth every 4 (four) hours as needed for severe pain (pain score 7-10). 06/05/24   Raford Lenis, MD  pantoprazole  (PROTONIX ) 40 MG tablet Take 1 tablet (40 mg total) by mouth daily. 06/09/24   Suellen Cantor A, PA-C  potassium chloride  SA (KLOR-CON  M) 20 MEQ tablet Take 1 tablet (20 mEq total) by mouth 2 (two) times daily. 06/05/24   Raford Lenis, MD  cetirizine  (ZYRTEC  ALLERGY) 10 MG tablet Take 1 tablet (10 mg total) by mouth 2 (two) times daily. Patient not taking: Reported on 12/06/2020 11/12/20 01/24/21  Muthersbaugh, Chiquita, PA-C    Allergies: Iodinated contrast media, Lupine bean extract, Ceftriaxone , and Morphine     Review of Systems  Genitourinary:  Positive for vaginal bleeding.  All other systems reviewed and are negative.   Updated Vital Signs BP (!) 139/94 (BP Location: Right Arm)   Pulse 79   Temp (!) 97.5 F (36.4 C)   Resp 16   LMP 08/08/2024 (Approximate)  SpO2 100%   Physical Exam Vitals and nursing note reviewed.   Gen: NAD Eyes: PERRL, EOMI HEENT: no oropharyngeal swelling Neck: trachea midline Resp: clear to auscultation bilaterally Card: RRR, no murmurs, rubs, or gallops Abd: nontender, nondistended Extremities: no calf tenderness, no edema Vascular: 2+ radial pulses bilaterally, 2+ DP pulses bilaterally Skin: no rashes Psyc: acting appropriately   (all labs ordered are listed, but only abnormal results are displayed) Labs Reviewed  CBC WITH DIFFERENTIAL/PLATELET - Abnormal; Notable for the following components:      Result Value   WBC 11.0  (*)    All other components within normal limits  URINALYSIS, ROUTINE W REFLEX MICROSCOPIC - Abnormal; Notable for the following components:   APPearance HAZY (*)    Hgb urine dipstick LARGE (*)    Leukocytes,Ua TRACE (*)    Bacteria, UA RARE (*)    All other components within normal limits  HCG, SERUM, QUALITATIVE    EKG: None  Radiology: No results found.   Procedures   Medications Ordered in the ED  megestrol  (MEGACE ) tablet 40 mg (has no administration in time range)                                    Medical Decision Making 38 year old female with no reported past medical history presenting to the emergency department today with vaginal bleeding.  The patient's labs drawn at triage are unremarkable.  I will start the patient on Provera .  I will call and discuss her case with our OB/GYN on-call to try to see if their office can reach out to schedule follow-up appointment since she has had multiple ER visits for this.  The patient's hemoglobin here is stable.The patient's pelvic exam chaperoned by female nursing staff shows minimal blood coming from the cervical os.  There are no other concerning findings on exam.  I did call and discussed this with Dr. Nicholaus who is on-call for GYN since patient has had multiple ER visits.  She will try to reach out to have the patient seen in their [place] office.  Will start the patient on Megace  after discussion with Dr. Nicholaus.  She will be discharged with return precautions.  Amount and/or Complexity of Data Reviewed Labs: ordered.  Risk Prescription drug management.        Final diagnoses:  Dysfunctional uterine bleeding    ED Discharge Orders          Ordered    megestrol  (MEGACE ) 40 MG tablet  Daily        08/22/24 1758               Ula Prentice SAUNDERS, MD 08/22/24 [phone]

## 2024-09-01 ENCOUNTER — Encounter (HOSPITAL_COMMUNITY): Payer: Self-pay | Admitting: Emergency Medicine

## 2024-09-01 ENCOUNTER — Emergency Department (HOSPITAL_COMMUNITY): Payer: Self-pay

## 2024-09-01 ENCOUNTER — Other Ambulatory Visit: Payer: Self-pay

## 2024-09-01 ENCOUNTER — Emergency Department (HOSPITAL_COMMUNITY)
Admission: EM | Admit: 2024-09-01 | Discharge: 2024-09-01 | Disposition: A | Discharge status: Home / Self Care | Payer: Self-pay | Attending: Emergency Medicine | Admitting: Emergency Medicine

## 2024-09-01 DIAGNOSIS — N3001 Acute cystitis with hematuria: Secondary | ICD-10-CM | POA: Insufficient documentation

## 2024-09-01 LAB — CBC WITH DIFFERENTIAL/PLATELET
Abs Immature Granulocytes: 0.02 K/uL (ref 0.00–0.07)
Basophils Absolute: 0 K/uL (ref 0.0–0.1)
Basophils Relative: 0 %
Eosinophils Absolute: 0 K/uL (ref 0.0–0.5)
Eosinophils Relative: 0 %
HCT: 35.7 % — ABNORMAL LOW (ref 36.0–46.0)
Hemoglobin: 12.2 g/dL (ref 12.0–15.0)
Immature Granulocytes: 0 %
Lymphocytes Relative: 20 %
Lymphs Abs: 1.1 K/uL (ref 0.7–4.0)
MCH: 30.6 pg (ref 26.0–34.0)
MCHC: 34.2 g/dL (ref 30.0–36.0)
MCV: 89.5 fL (ref 80.0–100.0)
Monocytes Absolute: 0.2 K/uL (ref 0.1–1.0)
Monocytes Relative: 3 %
Neutro Abs: 4.4 K/uL (ref 1.7–7.7)
Neutrophils Relative %: 77 %
Platelets: 245 K/uL (ref 150–400)
RBC: 3.99 MIL/uL (ref 3.87–5.11)
RDW: 13.2 % (ref 11.5–15.5)
WBC: 5.8 K/uL (ref 4.0–10.5)
nRBC: 0 % (ref 0.0–0.2)

## 2024-09-01 LAB — URINALYSIS, W/ REFLEX TO CULTURE (INFECTION SUSPECTED)
Bilirubin Urine: NEGATIVE
Glucose, UA: NEGATIVE mg/dL
Hgb urine dipstick: NEGATIVE
Ketones, ur: NEGATIVE mg/dL
Leukocytes,Ua: NEGATIVE
Nitrite: NEGATIVE
Protein, ur: NEGATIVE mg/dL
Specific Gravity, Urine: 1.023 (ref 1.005–1.030)
pH: 5 (ref 5.0–8.0)

## 2024-09-01 LAB — RESP PANEL BY RT-PCR (RSV, FLU A&B, COVID)  RVPGX2
Influenza A by PCR: NEGATIVE
Influenza B by PCR: NEGATIVE
Resp Syncytial Virus by PCR: NEGATIVE
SARS Coronavirus 2 by RT PCR: NEGATIVE

## 2024-09-01 LAB — COMPREHENSIVE METABOLIC PANEL WITH GFR
ALT: 26 U/L (ref 0–44)
AST: 32 U/L (ref 15–41)
Albumin: 4.5 g/dL (ref 3.5–5.0)
Alkaline Phosphatase: 137 U/L — ABNORMAL HIGH (ref 38–126)
Anion gap: 15 (ref 5–15)
BUN: 11 mg/dL (ref 6–20)
CO2: 19 mmol/L — ABNORMAL LOW (ref 22–32)
Calcium: 9 mg/dL (ref 8.9–10.3)
Chloride: 104 mmol/L (ref 98–111)
Creatinine, Ser: 0.7 mg/dL (ref 0.44–1.00)
GFR, Estimated: >60 mL/min (ref >60)
Glucose, Bld: 111 mg/dL — ABNORMAL HIGH (ref 70–99)
Potassium: 3.2 mmol/L — ABNORMAL LOW (ref 3.5–5.1)
Sodium: 138 mmol/L (ref 135–145)
Total Bilirubin: 0.2 mg/dL (ref 0.0–1.2)
Total Protein: 7.4 g/dL (ref 6.5–8.1)

## 2024-09-01 LAB — HCG, QUANTITATIVE, PREGNANCY: hCG, Beta Chain, Quant, S: <1 m[IU]/mL (ref <5)

## 2024-09-01 MED ORDER — LEVOFLOXACIN 500 MG PO TABS: 500.0000 mg | ORAL_TABLET | Freq: Every day | ORAL | 0 refills | Status: AC

## 2024-09-01 MED ORDER — SULFAMETHOXAZOLE-TRIMETHOPRIM 800-160 MG PO TABS: 1.0000 | ORAL_TABLET | Freq: Two times a day (BID) | ORAL | 0 refills | Status: DC

## 2024-09-01 MED ORDER — KETOROLAC TROMETHAMINE 30 MG/ML IJ SOLN
30.0000 mg | Freq: Once | INTRAMUSCULAR | Status: AC
  Administered 2024-09-01: 21:00:00 30 mg via INTRAVENOUS
  Filled 2024-09-01: qty 1

## 2024-09-01 MED ORDER — ONDANSETRON HCL 4 MG/2ML IJ SOLN
4.0000 mg | Freq: Once | INTRAMUSCULAR | Status: AC
  Administered 2024-09-01: 21:00:00 4 mg via INTRAVENOUS
  Filled 2024-09-01: qty 2

## 2024-09-01 MED ORDER — IBUPROFEN 800 MG PO TABS: 800.0000 mg | ORAL_TABLET | Freq: Three times a day (TID) | ORAL | 0 refills | Status: AC | PRN

## 2024-09-01 NOTE — ED Notes (Signed)
 Pt ambulated to the bathroom for urine sample in NAD.

## 2024-09-01 NOTE — ED Triage Notes (Signed)
 Pt c/o fever, headache, and emesis since this AM.

## 2024-09-01 NOTE — Discharge Instructions (Signed)
 Follow-up with family doctor or at the health department

## 2024-09-01 NOTE — ED Notes (Signed)
 Patient transported to CT via stretcher.

## 2024-09-02 NOTE — ED Provider Notes (Signed)
 [place] EMERGENCY DEPARTMENT AT [facility] Provider Note   CSN: [id number] Arrival date & time: 09/01/24  1643     Patient presents with: Fever   Lorraine [name] is a 38 y.o. female.   Patient complains of a fever and a headache but no cough no sore throat  The history is provided by the patient and medical records. No language interpreter was used.  Fever Temp source:  Subjective Severity:  Mild Onset quality:  Sudden Timing:  Intermittent Progression:  Waxing and waning Chronicity:  New Relieved by:  Nothing Worsened by:  Nothing Ineffective treatments:  None tried Associated symptoms: no chest pain, no congestion, no cough, no diarrhea, no headaches and no rash        Prior to Admission medications  Medication Sig Start Date End Date Taking? Authorizing Provider  ibuprofen  (ADVIL ) 800 MG tablet Take 1 tablet (800 mg total) by mouth every 8 (eight) hours as needed for moderate pain (pain score 4-6). 09/01/24  Yes Suzette Pac, MD  levofloxacin  (LEVAQUIN ) 500 MG tablet Take 1 tablet (500 mg total) by mouth daily. X 7 days 09/01/24  Yes Kaytee Taliercio, MD  acetaminophen  (TYLENOL  8 HOUR) 650 MG CR tablet Take 1 tablet (650 mg total) by mouth every 8 (eight) hours as needed for pain or fever. 07/19/24   Charlyn Sora, MD  albuterol  (VENTOLIN  HFA) 108 (90 Base) MCG/ACT inhaler Inhale 2 puffs into the lungs every 4 (four) hours as needed for wheezing or shortness of breath. 08/25/22   Suzette Pac, MD  ciprofloxacin  (CIPRO ) 500 MG tablet Take 1 tablet (500 mg total) by mouth every 12 (twelve) hours. 06/05/24   Raford Lenis, MD  dicyclomine  (BENTYL ) 20 MG tablet Take 1 tablet (20 mg total) by mouth 2 (two) times daily. 07/10/24   Smoot, Lauraine LABOR, PA-C  megestrol  (MEGACE ) 40 MG tablet Take 1 tablet (40 mg total) by mouth daily. 08/22/24   Ula Prentice SAUNDERS, MD  methocarbamol  (ROBAXIN ) 500 MG tablet Take 1 tablet (500 mg total) by mouth 2 (two) times daily. 08/13/24    Idol, Julie, PA-C  ondansetron  (ZOFRAN ) 4 MG tablet Take 1 tablet (4 mg total) by mouth every 6 (six) hours. 02/19/24   Myriam Dorn BROCKS, PA  ondansetron  (ZOFRAN -ODT) 4 MG disintegrating tablet Take 1 tablet (4 mg total) by mouth every 8 (eight) hours as needed for nausea or vomiting. 08/19/24   Dufour, Marry RAMAN, PA-C  oxyCODONE  (ROXICODONE ) 5 MG immediate release tablet Take 1 tablet (5 mg total) by mouth every 4 (four) hours as needed for severe pain (pain score 7-10). 06/05/24   Raford Lenis, MD  pantoprazole  (PROTONIX ) 40 MG tablet Take 1 tablet (40 mg total) by mouth daily. 06/09/24   Suellen Cantor A, PA-C  potassium chloride  SA (KLOR-CON  M) 20 MEQ tablet Take 1 tablet (20 mEq total) by mouth 2 (two) times daily. 06/05/24   Raford Lenis, MD  cetirizine  (ZYRTEC  ALLERGY) 10 MG tablet Take 1 tablet (10 mg total) by mouth 2 (two) times daily. Patient not taking: Reported on 12/06/2020 11/12/20 01/24/21  Muthersbaugh, Chiquita, PA-C    Allergies: Iodinated contrast media, Lupine bean extract, Ceftriaxone , and Morphine     Review of Systems  Constitutional:  Positive for fever. Negative for appetite change and fatigue.  HENT:  Negative for congestion, ear discharge and sinus pressure.   Eyes:  Negative for discharge.  Respiratory:  Negative for cough.   Cardiovascular:  Negative for chest pain.  Gastrointestinal:  Negative  for abdominal pain and diarrhea.  Genitourinary:  Negative for frequency and hematuria.  Musculoskeletal:  Negative for back pain.  Skin:  Negative for rash.  Neurological:  Negative for seizures and headaches.  Psychiatric/Behavioral:  Negative for hallucinations.     Updated Vital Signs BP (!) 128/94   Pulse 95   Temp 100.1 F (37.8 C) (Oral)   Resp 16   Ht 5' (1.524 m)   Wt 81.2 kg   LMP 08/28/2024 (Approximate)   SpO2 96%   BMI 34.96 kg/m   Physical Exam Vitals and nursing note reviewed.  Constitutional:      Appearance: She is well-developed.  HENT:      Head: Normocephalic.     Nose: Nose normal.     Mouth/Throat:     Mouth: Mucous membranes are moist.  Eyes:     General: No scleral icterus.    Conjunctiva/sclera: Conjunctivae normal.  Neck:     Thyroid : No thyromegaly.  Cardiovascular:     Rate and Rhythm: Normal rate and regular rhythm.     Heart sounds: No murmur heard.    No friction rub. No gallop.  Pulmonary:     Breath sounds: No stridor. No wheezing or rales.  Chest:     Chest wall: No tenderness.  Abdominal:     General: There is no distension.     Tenderness: There is no abdominal tenderness. There is no rebound.  Musculoskeletal:        General: Normal range of motion.     Cervical back: Neck supple.  Lymphadenopathy:     Cervical: No cervical adenopathy.  Skin:    Findings: No erythema or rash.  Neurological:     Mental Status: She is alert and oriented to person, place, and time.     Motor: No abnormal muscle tone.     Coordination: Coordination normal.  Psychiatric:        Behavior: Behavior normal.     (all labs ordered are listed, but only abnormal results are displayed) Labs Reviewed  CBC WITH DIFFERENTIAL/PLATELET - Abnormal; Notable for the following components:      Result Value   HCT 35.7 (*)    All other components within normal limits  COMPREHENSIVE METABOLIC PANEL WITH GFR - Abnormal; Notable for the following components:   Potassium 3.2 (*)    CO2 19 (*)    Glucose, Bld 111 (*)    Alkaline Phosphatase 137 (*)    All other components within normal limits  URINALYSIS, W/ REFLEX TO CULTURE (INFECTION SUSPECTED) - Abnormal; Notable for the following components:   APPearance HAZY (*)    Bacteria, UA RARE (*)    All other components within normal limits  RESP PANEL BY RT-PCR (RSV, FLU A&B, COVID)  RVPGX2  URINE CULTURE  HCG, QUANTITATIVE, PREGNANCY    EKG: None  Radiology: CT Renal Stone Study Result Date: 09/01/2024 EXAM: CT ABDOMEN AND PELVIS WITHOUT CONTRAST 09/01/2024 08:22:43 PM  TECHNIQUE: CT of the abdomen and pelvis was performed without the administration of intravenous contrast. Multiplanar reformatted images are provided for review. Automated exposure control, iterative reconstruction, and/or weight-based adjustment of the mA/kV was utilized to reduce the radiation dose to as low as reasonably achievable. COMPARISON: 06/05/2024 CLINICAL HISTORY: Fevers and abdominal pain. FINDINGS: LOWER CHEST: Lung bases are clear. LIVER: The liver is unremarkable. GALLBLADDER AND BILE DUCTS: The gallbladder has been surgically removed. No biliary ductal dilatation. SPLEEN: No acute abnormality. PANCREAS: No acute abnormality. ADRENAL GLANDS: No  acute abnormality. KIDNEYS, URETERS AND BLADDER: No stones in the kidneys or ureters. No hydronephrosis. Urinary bladder is unremarkable. GI AND BOWEL: Stomach demonstrates no acute abnormality. The appendix is within normal limits. Diverticular change of the colon is noted without diverticulitis. There is no bowel obstruction. PERITONEUM AND RETROPERITONEUM: No ascites. No free air. VASCULATURE: Aorta is normal in caliber. LYMPH NODES: No lymphadenopathy. REPRODUCTIVE ORGANS: Changes of prior tubal ligation are seen. A 2.7 cm simple left ovarian cyst is noted. No follow-up is recommended. BONES AND SOFT TISSUES: No acute osseous abnormality. No focal soft tissue abnormality. IMPRESSION: 1. No acute findings in the abdomen or pelvis. Electronically signed by: Oneil Devonshire MD 09/01/2024 08:28 PM EST RP Workstation: [id number]   DG Chest 2 View Result Date: 09/01/2024 EXAM: 2 VIEW(S) XRAY OF THE CHEST 09/01/2024 07:44:00 PM COMPARISON: 07/19/2024 CLINICAL HISTORY: Fevers FINDINGS: LUNGS AND PLEURA: No focal pulmonary opacity. No pleural effusion. No pneumothorax. HEART AND MEDIASTINUM: No acute abnormality of the cardiac and mediastinal silhouettes. BONES AND SOFT TISSUES: Cholecystectomy clips noted. No acute osseous abnormality. IMPRESSION: 1. No acute  findings. Electronically signed by: Oneil Devonshire MD 09/01/2024 07:48 PM EST RP Workstation: [id number]     Procedures   Medications Ordered in the ED  ondansetron  (ZOFRAN ) injection 4 mg (4 mg Intravenous Given 09/01/24 2119)  ketorolac  (TORADOL ) 30 MG/ML injection 30 mg (30 mg Intravenous Given 09/01/24 2118)                                    Medical Decision Making Amount and/or Complexity of Data Reviewed Labs: ordered. Radiology: ordered.  Risk Prescription drug management.   Patient with hematuria and possible urinary tract infection.  She was placed on Bactrim  and will follow-up with a family doctor     Final diagnoses:  Hematuria due to acute cystitis    ED Discharge Orders          Ordered    sulfamethoxazole -trimethoprim  (BACTRIM  DS) 800-160 MG tablet  2 times daily,   Status:  Discontinued        09/01/24 2234    ibuprofen  (ADVIL ) 800 MG tablet  Every 8 hours PRN        09/01/24 2234    levofloxacin  (LEVAQUIN ) 500 MG tablet  Daily        09/01/24 2236               Suzette Pac, MD 09/02/24 1142

## 2024-09-03 LAB — URINE CULTURE

## 2024-09-25 ENCOUNTER — Encounter (HOSPITAL_COMMUNITY): Payer: Self-pay

## 2024-09-25 ENCOUNTER — Emergency Department (HOSPITAL_COMMUNITY)
Admission: EM | Admit: 2024-09-25 | Discharge: 2024-09-25 | Disposition: A | Discharge status: Home / Self Care | Payer: Self-pay | Attending: Emergency Medicine | Admitting: Emergency Medicine

## 2024-09-25 ENCOUNTER — Other Ambulatory Visit: Payer: Self-pay

## 2024-09-25 DIAGNOSIS — N3000 Acute cystitis without hematuria: Secondary | ICD-10-CM | POA: Insufficient documentation

## 2024-09-25 DIAGNOSIS — J45909 Unspecified asthma, uncomplicated: Secondary | ICD-10-CM | POA: Insufficient documentation

## 2024-09-25 DIAGNOSIS — M545 Low back pain, unspecified: Secondary | ICD-10-CM

## 2024-09-25 LAB — URINALYSIS, ROUTINE W REFLEX MICROSCOPIC
Bilirubin Urine: NEGATIVE
Glucose, UA: NEGATIVE mg/dL
Hgb urine dipstick: NEGATIVE
Ketones, ur: NEGATIVE mg/dL
Nitrite: NEGATIVE
Protein, ur: NEGATIVE mg/dL
Specific Gravity, Urine: >1.03 — ABNORMAL HIGH (ref 1.005–1.030)
pH: 6 (ref 5.0–8.0)

## 2024-09-25 LAB — URINALYSIS, MICROSCOPIC (REFLEX)
Bacteria, UA: NONE SEEN
WBC, UA: >50 WBC/hpf (ref 0–5)

## 2024-09-25 LAB — CBC
HCT: 37.9 % (ref 36.0–46.0)
Hemoglobin: 12.3 g/dL (ref 12.0–15.0)
MCH: 30.1 pg (ref 26.0–34.0)
MCHC: 32.5 g/dL (ref 30.0–36.0)
MCV: 92.7 fL (ref 80.0–100.0)
Platelets: 313 K/uL (ref 150–400)
RBC: 4.09 MIL/uL (ref 3.87–5.11)
RDW: 12.6 % (ref 11.5–15.5)
WBC: 7.4 K/uL (ref 4.0–10.5)
nRBC: 0 % (ref 0.0–0.2)

## 2024-09-25 LAB — BASIC METABOLIC PANEL WITH GFR
Anion gap: 14 (ref 5–15)
BUN: 17 mg/dL (ref 6–20)
CO2: 21 mmol/L — ABNORMAL LOW (ref 22–32)
Calcium: 9.7 mg/dL (ref 8.9–10.3)
Chloride: 103 mmol/L (ref 98–111)
Creatinine, Ser: 0.73 mg/dL (ref 0.44–1.00)
GFR, Estimated: >60 mL/min
Glucose, Bld: 103 mg/dL — ABNORMAL HIGH (ref 70–99)
Potassium: 3.6 mmol/L (ref 3.5–5.1)
Sodium: 138 mmol/L (ref 135–145)

## 2024-09-25 LAB — HCG, SERUM, QUALITATIVE: Preg, Serum: NEGATIVE

## 2024-09-25 MED ORDER — HYDROCODONE-ACETAMINOPHEN 5-325 MG PO TABS
1.0000 | ORAL_TABLET | Freq: Once | ORAL | Status: AC
  Administered 2024-09-25: 1 via ORAL
  Filled 2024-09-25: qty 1

## 2024-09-25 MED ORDER — IBUPROFEN 600 MG PO TABS: 600.0000 mg | ORAL_TABLET | Freq: Four times a day (QID) | ORAL | 0 refills | Status: AC | PRN

## 2024-09-25 MED ORDER — PHENAZOPYRIDINE HCL 200 MG PO TABS: 200.0000 mg | ORAL_TABLET | Freq: Three times a day (TID) | ORAL | 0 refills | Status: AC

## 2024-09-25 MED ORDER — KETOROLAC TROMETHAMINE 30 MG/ML IJ SOLN
30.0000 mg | Freq: Once | INTRAMUSCULAR | Status: AC
  Administered 2024-09-25: 30 mg via INTRAMUSCULAR
  Filled 2024-09-25: qty 1

## 2024-09-25 MED ORDER — SULFAMETHOXAZOLE-TRIMETHOPRIM 800-160 MG PO TABS: 1.0000 | ORAL_TABLET | Freq: Two times a day (BID) | ORAL | 0 refills | Status: AC

## 2024-09-25 MED ORDER — HYDROCODONE-ACETAMINOPHEN 5-325 MG PO TABS: 1.0000 | ORAL_TABLET | ORAL | 0 refills | Status: DC | PRN

## 2024-09-25 MED ORDER — SULFAMETHOXAZOLE-TRIMETHOPRIM 800-160 MG PO TABS
1.0000 | ORAL_TABLET | Freq: Once | ORAL | Status: AC
  Administered 2024-09-25: 1 via ORAL
  Filled 2024-09-25: qty 1

## 2024-09-25 NOTE — ED Triage Notes (Signed)
 Pt bib pov c/o lower abdominal pain that travels to her back. Pt states when she pee it hurts really bad. Pt sx started two days ago. Pt states the pain hurts when she walks and cough.  Pt endorse fever/chills/N  Pt denies burning and blood in urine.   Pt has been taking ibuprofen  without relief  Interpretor Morse # [phone]

## 2024-09-25 NOTE — ED Provider Notes (Signed)
 " [place] EMERGENCY DEPARTMENT AT [facility] Provider Note   CSN: [id number] Arrival date & time: 09/25/24  9188     Patient presents with: Back Pain and Abdominal Pain   Lorraine [name] is a 39 y.o. female.   Pt is a 39 yo female with pmhx significant for diverticulitis and asthma.  She presents to the ED today with lower abd pain, dysuria and some back pain.  Pt took ibuprofen  without improvement.  Due to language barrier, an interpreter was present during the history-taking and subsequent discussion (and for part of the physical exam) with this patient.        Prior to Admission medications  Medication Sig Start Date End Date Taking? Authorizing Provider  HYDROcodone -acetaminophen  (NORCO/VICODIN) 5-325 MG tablet Take 1 tablet by mouth every 4 (four) hours as needed. 09/25/24  Yes Dean Clarity, MD  ibuprofen  (ADVIL ) 600 MG tablet Take 1 tablet (600 mg total) by mouth every 6 (six) hours as needed. 09/25/24  Yes Dean Clarity, MD  phenazopyridine  (PYRIDIUM ) 200 MG tablet Take 1 tablet (200 mg total) by mouth 3 (three) times daily. 09/25/24  Yes Dean Clarity, MD  sulfamethoxazole -trimethoprim  (BACTRIM  DS) 800-160 MG tablet Take 1 tablet by mouth 2 (two) times daily for 7 days. 09/25/24 10/02/24 Yes Dean Clarity, MD  acetaminophen  (TYLENOL  8 HOUR) 650 MG CR tablet Take 1 tablet (650 mg total) by mouth every 8 (eight) hours as needed for pain or fever. 07/19/24   Charlyn Sora, MD  albuterol  (VENTOLIN  HFA) 108 (90 Base) MCG/ACT inhaler Inhale 2 puffs into the lungs every 4 (four) hours as needed for wheezing or shortness of breath. 08/25/22   Suzette Pac, MD  ciprofloxacin  (CIPRO ) 500 MG tablet Take 1 tablet (500 mg total) by mouth every 12 (twelve) hours. 06/05/24   Raford Lenis, MD  dicyclomine  (BENTYL ) 20 MG tablet Take 1 tablet (20 mg total) by mouth 2 (two) times daily. 07/10/24   Smoot, Lauraine LABOR, PA-C  ibuprofen  (ADVIL ) 800 MG tablet Take 1 tablet (800 mg total) by  mouth every 8 (eight) hours as needed for moderate pain (pain score 4-6). 09/01/24   Suzette Pac, MD  levofloxacin  (LEVAQUIN ) 500 MG tablet Take 1 tablet (500 mg total) by mouth daily. X 7 days 09/01/24   Suzette Pac, MD  megestrol  (MEGACE ) 40 MG tablet Take 1 tablet (40 mg total) by mouth daily. 08/22/24   Ula Prentice SAUNDERS, MD  methocarbamol  (ROBAXIN ) 500 MG tablet Take 1 tablet (500 mg total) by mouth 2 (two) times daily. 08/13/24   Idol, Hubbard Seldon, PA-C  ondansetron  (ZOFRAN ) 4 MG tablet Take 1 tablet (4 mg total) by mouth every 6 (six) hours. 02/19/24   Myriam Dorn BROCKS, PA  ondansetron  (ZOFRAN -ODT) 4 MG disintegrating tablet Take 1 tablet (4 mg total) by mouth every 8 (eight) hours as needed for nausea or vomiting. 08/19/24   Dufour, Marry RAMAN, PA-C  oxyCODONE  (ROXICODONE ) 5 MG immediate release tablet Take 1 tablet (5 mg total) by mouth every 4 (four) hours as needed for severe pain (pain score 7-10). 06/05/24   Raford Lenis, MD  pantoprazole  (PROTONIX ) 40 MG tablet Take 1 tablet (40 mg total) by mouth daily. 06/09/24   Suellen Cantor A, PA-C  potassium chloride  SA (KLOR-CON  M) 20 MEQ tablet Take 1 tablet (20 mEq total) by mouth 2 (two) times daily. 06/05/24   Raford Lenis, MD  cetirizine  (ZYRTEC  ALLERGY) 10 MG tablet Take 1 tablet (10 mg total) by mouth 2 (two) times  daily. Patient not taking: Reported on 12/06/2020 11/12/20 01/24/21  Muthersbaugh, Chiquita, PA-C    Allergies: Iodinated contrast media, Lupine bean extract, Ceftriaxone , and Morphine     Review of Systems  Genitourinary:  Positive for dysuria.  Musculoskeletal:  Positive for back pain.  All other systems reviewed and are negative.   Updated Vital Signs BP (!) 125/97   Pulse 62   Temp 98.1 F (36.7 C)   Resp 20   Ht 5' (1.524 m)   Wt 79.8 kg   LMP 09/13/2024 (Approximate)   SpO2 100%   BMI 34.37 kg/m   Physical Exam Vitals and nursing note reviewed.  Constitutional:      Appearance: She is well-developed. She is  obese.  HENT:     Head: Normocephalic and atraumatic.     Mouth/Throat:     Mouth: Mucous membranes are moist.     Pharynx: Oropharynx is clear.  Eyes:     Extraocular Movements: Extraocular movements intact.     Pupils: Pupils are equal, round, and reactive to light.  Cardiovascular:     Rate and Rhythm: Normal rate and regular rhythm.  Abdominal:     General: Abdomen is flat. Bowel sounds are normal.     Palpations: Abdomen is soft.     Tenderness: There is abdominal tenderness in the suprapubic area.  Skin:    General: Skin is warm.     Capillary Refill: Capillary refill takes less than 2 seconds.  Neurological:     General: No focal deficit present.     Mental Status: She is alert and oriented to person, place, and time.  Psychiatric:        Mood and Affect: Mood normal.        Behavior: Behavior normal.     (all labs ordered are listed, but only abnormal results are displayed) Labs Reviewed  URINALYSIS, ROUTINE W REFLEX MICROSCOPIC - Abnormal; Notable for the following components:      Result Value   Specific Gravity, Urine >1.030 (*)    Leukocytes,Ua SMALL (*)    All other components within normal limits  BASIC METABOLIC PANEL WITH GFR - Abnormal; Notable for the following components:   CO2 21 (*)    Glucose, Bld 103 (*)    All other components within normal limits  HCG, SERUM, QUALITATIVE  CBC  URINALYSIS, MICROSCOPIC (REFLEX)    EKG: None  Radiology: No results found.   Procedures   Medications Ordered in the ED  ketorolac  (TORADOL ) 30 MG/ML injection 30 mg (30 mg Intramuscular Given 09/25/24 1239)  sulfamethoxazole -trimethoprim  (BACTRIM  DS) 800-160 MG per tablet 1 tablet (1 tablet Oral Given 09/25/24 1239)  HYDROcodone -acetaminophen  (NORCO/VICODIN) 5-325 MG per tablet 1 tablet (1 tablet Oral Given 09/25/24 1239)                                    Medical Decision Making Amount and/or Complexity of Data Reviewed Labs: ordered.  Risk Prescription  drug management.   This patient presents to the ED for concern of dysuria, back pain, this involves an extensive number of treatment options, and is a complaint that carries with it a high risk of complications and morbidity.  The differential diagnosis includes uti, pyelo, kidney stones   Co morbidities that complicate the patient evaluation  diverticulitis and asthma   Additional history obtained:  Additional history obtained from epic chart review    Lab Tests:  I Ordered, and personally interpreted labs.  The pertinent results include:  cbc nl, bmp nl, preg neg, ua + uti  Medicines ordered and prescription drug management:  I ordered medication including toradol /bactrim /lortab  for sx  Reevaluation of the patient after these medicines showed that the patient improved I have reviewed the patients home medicines and have made adjustments as needed   Test Considered:  ct   Problem List / ED Course:  Uti with back pain:  possible early pyelo.  No significant cva tenderness.  She is not febrile and nl wbc.  No hematuria.  I will d/c her with bactrim .  She is to return if worse.  F/u with pcp.   Reevaluation:  After the interventions noted above, I reevaluated the patient and found that they have :improved   Social Determinants of Health:  Lives at home.  Spanish speaker.   Dispostion:  After consideration of the diagnostic results and the patients response to treatment, I feel that the patent would benefit from discharge with outpatient f/u.       Final diagnoses:  Acute cystitis without hematuria  Acute bilateral low back pain without sciatica    ED Discharge Orders          Ordered    sulfamethoxazole -trimethoprim  (BACTRIM  DS) 800-160 MG tablet  2 times daily        09/25/24 1259    ibuprofen  (ADVIL ) 600 MG tablet  Every 6 hours PRN        09/25/24 1259    HYDROcodone -acetaminophen  (NORCO/VICODIN) 5-325 MG tablet  Every 4 hours PRN        09/25/24  1259    phenazopyridine  (PYRIDIUM ) 200 MG tablet  3 times daily        09/25/24 1300               Dean Clarity, MD 09/25/24 1317  "

## 2024-09-26 ENCOUNTER — Other Ambulatory Visit: Payer: Self-pay

## 2024-09-26 ENCOUNTER — Encounter (HOSPITAL_COMMUNITY): Payer: Self-pay

## 2024-09-26 ENCOUNTER — Emergency Department (HOSPITAL_COMMUNITY)
Admission: EM | Admit: 2024-09-26 | Discharge: 2024-09-26 | Disposition: A | Discharge status: Home / Self Care | Payer: Self-pay | Attending: Emergency Medicine | Admitting: Emergency Medicine

## 2024-09-26 ENCOUNTER — Emergency Department (HOSPITAL_COMMUNITY): Payer: Self-pay

## 2024-09-26 DIAGNOSIS — R112 Nausea with vomiting, unspecified: Secondary | ICD-10-CM | POA: Insufficient documentation

## 2024-09-26 LAB — URINALYSIS, ROUTINE W REFLEX MICROSCOPIC
Bilirubin Urine: NEGATIVE
Glucose, UA: NEGATIVE mg/dL
Hgb urine dipstick: NEGATIVE
Ketones, ur: NEGATIVE mg/dL
Leukocytes,Ua: NEGATIVE
Nitrite: NEGATIVE
Protein, ur: NEGATIVE mg/dL
Specific Gravity, Urine: 1.016 (ref 1.005–1.030)
pH: 6 (ref 5.0–8.0)

## 2024-09-26 LAB — CBC
HCT: 33.9 % — ABNORMAL LOW (ref 36.0–46.0)
Hemoglobin: 11 g/dL — ABNORMAL LOW (ref 12.0–15.0)
MCH: 29.6 pg (ref 26.0–34.0)
MCHC: 32.4 g/dL (ref 30.0–36.0)
MCV: 91.4 fL (ref 80.0–100.0)
Platelets: 279 K/uL (ref 150–400)
RBC: 3.71 MIL/uL — ABNORMAL LOW (ref 3.87–5.11)
RDW: 12.4 % (ref 11.5–15.5)
WBC: 7.4 K/uL (ref 4.0–10.5)
nRBC: 0 % (ref 0.0–0.2)

## 2024-09-26 LAB — COMPREHENSIVE METABOLIC PANEL WITH GFR
ALT: 38 U/L (ref 0–44)
AST: 36 U/L (ref 15–41)
Albumin: 4.1 g/dL (ref 3.5–5.0)
Alkaline Phosphatase: 149 U/L — ABNORMAL HIGH (ref 38–126)
Anion gap: 14 (ref 5–15)
BUN: 12 mg/dL (ref 6–20)
CO2: 20 mmol/L — ABNORMAL LOW (ref 22–32)
Calcium: 8.8 mg/dL — ABNORMAL LOW (ref 8.9–10.3)
Chloride: 106 mmol/L (ref 98–111)
Creatinine, Ser: 0.73 mg/dL (ref 0.44–1.00)
GFR, Estimated: >60 mL/min
Glucose, Bld: 93 mg/dL (ref 70–99)
Potassium: 3.4 mmol/L — ABNORMAL LOW (ref 3.5–5.1)
Sodium: 140 mmol/L (ref 135–145)
Total Bilirubin: <0.2 mg/dL (ref 0.0–1.2)
Total Protein: 7 g/dL (ref 6.5–8.1)

## 2024-09-26 MED ORDER — ONDANSETRON HCL 4 MG PO TABS: 4.0000 mg | ORAL_TABLET | Freq: Four times a day (QID) | ORAL | 0 refills | Status: DC

## 2024-09-26 MED ORDER — ONDANSETRON 4 MG PO TBDP: 4.0000 mg | ORAL_TABLET | Freq: Once | ORAL | Status: DC | PRN

## 2024-09-26 MED ORDER — ONDANSETRON HCL 4 MG/2ML IJ SOLN: 4.0000 mg | Freq: Once | INTRAMUSCULAR | Status: DC

## 2024-09-26 MED ORDER — NAPROXEN 500 MG PO TABS: 500.0000 mg | ORAL_TABLET | Freq: Two times a day (BID) | ORAL | 0 refills | Status: AC

## 2024-09-26 MED ORDER — SODIUM CHLORIDE 0.9 % IV BOLUS
1000.0000 mL | Freq: Once | INTRAVENOUS | Status: AC
  Administered 2024-09-26: 1000 mL via INTRAVENOUS

## 2024-09-26 MED ORDER — ONDANSETRON HCL 4 MG/2ML IJ SOLN
4.0000 mg | Freq: Once | INTRAMUSCULAR | Status: AC
  Administered 2024-09-26: 4 mg via INTRAVENOUS
  Filled 2024-09-26: qty 2

## 2024-09-26 NOTE — ED Triage Notes (Signed)
 Pt was seen yesterday in the ED and was treated for a UTI with questionable pyelonephritis and she was started on Bactrim  DS. Today pt started having N/V and has vomited 6-8 times. She reports not being able to keep down any fluids or medication.

## 2024-09-26 NOTE — Discharge Instructions (Addendum)
 Your testing today reveals that you do not have a urinary tract infection so the medication must be working.  All of your tests have been normal, the CT scan shows that after you had a tubal ligation, the clips have fallen off and are no longer holding things together.  You may want to follow-up with a gynecologist to discuss this as this could cause you to become pregnant in the future.  I have prescribed a medication called Zofran  which can be taken every 6 hours as needed for nausea, Naprosyn  twice a day as needed for pain  If you do not have a family doctor see the list below  Los anlisis de hoy revelan que no tiene una infeccin del tracto urinario, por lo que el medicamento debe estar funcionando. Todos sus anlisis han sido normales. La tomografa computarizada sysco, despus de la ligadura de trompas, los clips se han desprendido y ya no johnson controls tejidos unidos. Le recomiendo que consulte con un gineclogo para hablar sobre esto, ya que podra quedar embarazada en el futuro.  Le he recetado Zofran , que puede tomar cada 6 horas segn sea necesario para las nuseas, y Naprosyn  dos veces al da segn sea necesario para chief technology officer.  Si no tiene un mdico de cabecera, m.d.c. holdings lista a continuacin.  [facility] Primary Care Doctor List    Rollene Pesa, MD. Specialty: [facility] Contact information: [address]  [place] [address]  [phone]   Glendia Fielding, MD. Specialty: [facility] Contact information: [address]  [place] [address]  [phone]   Benita Outhouse, MD Specialty: Internal Medicine Contact information: [address] [place] [address]  [phone]   Darlyn Hurst, MD. Specialty: Internal Medicine Contact information: [address]  [place] [address]  [phone]    [facility] ([facility]) Specialty: Family Medicine Contact information: [address]  [place] [address]  [phone]    Garnette Lolling, MD. Specialty: [facility] Contact information: [address]  [address]  [place] [address]  [phone]   Gaither Langton, MD. Specialty: Internal Medicine Contact information: [address]  [address]  [place] [address]  [phone]   [facility]: [address] [phone]  [place], Family medicine [address]  [phone]  [facility] [address] [place], [place] [phone]  [facility]: [address] [phone]    [facility]  [address] [place], [place] [address] [phone]  Services [facility] offers a variety of basic health services.  Services include but are not limited to: Blood pressure checks  Heart rate checks  Blood sugar checks  Urine analysis  Rapid strep tests  Pregnancy tests.  Health education and referrals  People needing more complex services will be directed to a physician online. Using these virtual visits, doctors can evaluate and prescribe medicine and treatments. There will be no medication on-site, though [facility] will help patients fill their prescriptions at little to no cost.   For More information please go to: [email or web address]  Allergy and Asthma:    [address] [place] [phone]  Urology:  [address]  [place] [phone]  [facility]  [address] [place], [place] [phone]  Orthopedics   [address] [place], [place] [phone]  Endocrinology  [address], [place] [phone]  Podiatry: [facility] [phone]

## 2024-09-26 NOTE — ED Provider Notes (Signed)
 " [place] EMERGENCY DEPARTMENT AT [facility] Provider Note   CSN: [id number] Arrival date & time: 09/26/24  1906     Patient presents with: Emesis   Lorraine [name] is a 40 y.o. female.    Emesis    This patient is a 39 year old female, she has a history of frequent visits to the emergency department, she has been seen for a UTI as recently as yesterday, she was seen for hematuria in the middle of December, dysfunctional uterine bleeding multiple times, abdominal pain multiple times, she presents to the emergency department multiple times per month for these complaints.  She reports that since being treated with Bactrim  for UTI starting yesterday she has had multiple episodes of nausea and vomiting, she states that she might have a fever but has not measured it, thinks that she might have chills, has not had any blood in the urine, pain is mostly in the right flank  Prior to Admission medications  Medication Sig Start Date End Date Taking? Authorizing Provider  naproxen  (NAPROSYN ) 500 MG tablet Take 1 tablet (500 mg total) by mouth 2 (two) times daily with a meal. 09/26/24  Yes Cleotilde Rogue, MD  ondansetron  (ZOFRAN ) 4 MG tablet Take 1 tablet (4 mg total) by mouth every 6 (six) hours. 09/26/24  Yes Cleotilde Rogue, MD  acetaminophen  (TYLENOL  8 HOUR) 650 MG CR tablet Take 1 tablet (650 mg total) by mouth every 8 (eight) hours as needed for pain or fever. 07/19/24   Charlyn Sora, MD  albuterol  (VENTOLIN  HFA) 108 (90 Base) MCG/ACT inhaler Inhale 2 puffs into the lungs every 4 (four) hours as needed for wheezing or shortness of breath. 08/25/22   Suzette Pac, MD  ciprofloxacin  (CIPRO ) 500 MG tablet Take 1 tablet (500 mg total) by mouth every 12 (twelve) hours. 06/05/24   Raford Lenis, MD  dicyclomine  (BENTYL ) 20 MG tablet Take 1 tablet (20 mg total) by mouth 2 (two) times daily. 07/10/24   Smoot, Lauraine LABOR, PA-C  HYDROcodone -acetaminophen  (NORCO/VICODIN) 5-325 MG tablet Take 1  tablet by mouth every 4 (four) hours as needed. 09/25/24   Dean Clarity, MD  ibuprofen  (ADVIL ) 600 MG tablet Take 1 tablet (600 mg total) by mouth every 6 (six) hours as needed. 09/25/24   Dean Clarity, MD  ibuprofen  (ADVIL ) 800 MG tablet Take 1 tablet (800 mg total) by mouth every 8 (eight) hours as needed for moderate pain (pain score 4-6). 09/01/24   Suzette Pac, MD  levofloxacin  (LEVAQUIN ) 500 MG tablet Take 1 tablet (500 mg total) by mouth daily. X 7 days 09/01/24   Suzette Pac, MD  megestrol  (MEGACE ) 40 MG tablet Take 1 tablet (40 mg total) by mouth daily. 08/22/24   Ula Prentice SAUNDERS, MD  methocarbamol  (ROBAXIN ) 500 MG tablet Take 1 tablet (500 mg total) by mouth 2 (two) times daily. 08/13/24   Idol, Julie, PA-C  pantoprazole  (PROTONIX ) 40 MG tablet Take 1 tablet (40 mg total) by mouth daily. 06/09/24   Suellen Cantor A, PA-C  phenazopyridine  (PYRIDIUM ) 200 MG tablet Take 1 tablet (200 mg total) by mouth 3 (three) times daily. 09/25/24   Dean Clarity, MD  potassium chloride  SA (KLOR-CON  M) 20 MEQ tablet Take 1 tablet (20 mEq total) by mouth 2 (two) times daily. 06/05/24   Raford Lenis, MD  sulfamethoxazole -trimethoprim  (BACTRIM  DS) 800-160 MG tablet Take 1 tablet by mouth 2 (two) times daily for 7 days. 09/25/24 10/02/24  Dean Clarity, MD  cetirizine  (ZYRTEC  ALLERGY) 10 MG tablet  Take 1 tablet (10 mg total) by mouth 2 (two) times daily. Patient not taking: Reported on 12/06/2020 11/12/20 01/24/21  Muthersbaugh, Chiquita, PA-C    Allergies: Iodinated contrast media, Lupine bean extract, Ceftriaxone , and Morphine     Review of Systems  Gastrointestinal:  Positive for vomiting.  All other systems reviewed and are negative.   Updated Vital Signs BP 131/85 (BP Location: Right Arm)   Pulse 74   Temp 98.8 F (37.1 C) (Oral)   Resp 20   Ht 1.524 m (5')   Wt 79.8 kg   LMP 09/13/2024 (Approximate)   SpO2 100%   BMI 34.37 kg/m   Physical Exam Vitals and nursing note reviewed.   Constitutional:      General: She is not in acute distress.    Appearance: She is well-developed.  HENT:     Head: Normocephalic and atraumatic.     Mouth/Throat:     Pharynx: No oropharyngeal exudate.  Eyes:     General: No scleral icterus.       Right eye: No discharge.        Left eye: No discharge.     Conjunctiva/sclera: Conjunctivae normal.     Pupils: Pupils are equal, round, and reactive to light.  Neck:     Thyroid : No thyromegaly.     Vascular: No JVD.  Cardiovascular:     Rate and Rhythm: Normal rate and regular rhythm.     Heart sounds: Normal heart sounds. No murmur heard.    No friction rub. No gallop.  Pulmonary:     Effort: Pulmonary effort is normal. No respiratory distress.     Breath sounds: Normal breath sounds. No wheezing or rales.  Abdominal:     General: Bowel sounds are normal. There is no distension.     Palpations: Abdomen is soft. There is no mass.     Tenderness: There is abdominal tenderness.     Comments: Minimal left lower quadrant tenderness, right CVA tenderness present  Musculoskeletal:        General: No tenderness. Normal range of motion.     Cervical back: Normal range of motion and neck supple.     Right lower leg: No edema.     Left lower leg: No edema.  Lymphadenopathy:     Cervical: No cervical adenopathy.  Skin:    General: Skin is warm and dry.     Findings: No erythema or rash.  Neurological:     Mental Status: She is alert.     Coordination: Coordination normal.  Psychiatric:        Behavior: Behavior normal.     (all labs ordered are listed, but only abnormal results are displayed) Labs Reviewed  COMPREHENSIVE METABOLIC PANEL WITH GFR - Abnormal; Notable for the following components:      Result Value   Potassium 3.4 (*)    CO2 20 (*)    Calcium  8.8 (*)    Alkaline Phosphatase 149 (*)    All other components within normal limits  CBC - Abnormal; Notable for the following components:   RBC 3.71 (*)    Hemoglobin  11.0 (*)    HCT 33.9 (*)    All other components within normal limits  URINE CULTURE  URINALYSIS, ROUTINE W REFLEX MICROSCOPIC    EKG: None  Radiology: CT ABDOMEN PELVIS WO CONTRAST Result Date: 09/26/2024 EXAM: CT ABDOMEN AND PELVIS WITHOUT CONTRAST 09/26/2024 09:56:44 PM TECHNIQUE: CT of the abdomen and pelvis was performed without the administration  of intravenous contrast. Multiplanar reformatted images are provided for review. Automated exposure control, iterative reconstruction, and/or weight-based adjustment of the mA/kV was utilized to reduce the radiation dose to as low as reasonably achievable. COMPARISON: 09/01/2024 CLINICAL HISTORY: Abdominal/flank pain, stone suspected; R flank pain / UTI / r/o pyelo. Nausea, vomiting, pyelonephritis. FINDINGS: LOWER CHEST: No acute abnormality. LIVER: The liver is unremarkable. GALLBLADDER AND BILE DUCTS: Status post cholecystectomy. No biliary ductal dilatation. SPLEEN: No acute abnormality. PANCREAS: No acute abnormality. ADRENAL GLANDS: No acute abnormality. KIDNEYS, URETERS AND BLADDER: No stones in the kidneys or ureters. No hydronephrosis. No perinephric or periureteral stranding. Urinary bladder is unremarkable. GI AND BOWEL: Moderate descending and sigmoid colonic diverticulosis without superimposed acute inflammatory change. The stomach, small bowel, and large bowel are otherwise unremarkable. Appendix normal. There is no bowel obstruction. PERITONEUM AND RETROPERITONEUM: No ascites. No free air. VASCULATURE: Aorta is normal in caliber. LYMPH NODES: No lymphadenopathy. REPRODUCTIVE ORGANS: Tubal ligation clips are seen displaced from their expected location along the fallopian tubes into the dependent pelvis. The pelvic organs are otherwise unremarkable. BONES AND SOFT TISSUES: Osseous structures are age appropriate. No acute bone abnormality. No lytic or blastic bone lesion. No focal soft tissue abnormality. IMPRESSION: 1. No acute findings in the  abdomen or pelvis. 2. Displaced tubal ligation clips within the dependent pelvis. 3. Moderate descending and sigmoid colonic diverticulosis without superimposed acute inflammatory change. 4. Status post cholecystectomy. Electronically signed by: Dorethia Molt MD MD 09/26/2024 10:24 PM EST RP Workstation: [id number]     Procedures   Medications Ordered in the ED  ondansetron  (ZOFRAN ) injection 4 mg (4 mg Intravenous Given 09/26/24 2100)  sodium chloride  0.9 % bolus 1,000 mL (1,000 mLs Intravenous New Bag/Given 09/26/24 2100)                                    Medical Decision Making Amount and/or Complexity of Data Reviewed Labs: ordered. Radiology: ordered.  Risk Prescription drug management.   The patient is certainly higher risk for complications given that she is morbidly obese and has a recent UTI, will add imaging although she has had multiple CT scans in the past I think this is reasonable given her escalating symptoms.  Labs:  I  personally viewed and interpreted the labs which show urinalysis without signs of infection or bleeding, metabolic panel with normal renal function, CBC with normal white blood cell count, minimal anemia at 11.0, no abnormalities of the platelets   Radiology Imaging: I personally viewed the images of the ordered radiographic studies and find no acute findings however there are tubal ligation clips that no longer in place, the patient was informed of this I agree with the radiologist interpretation as well  The patient has presented multiple times for abdominal pain, flank pain has had multiple CT scans and today she has had them yet again.  She was informed of her results, her urinalysis does not show infection, she appears medically stable with normal vital signs, she will be given medications for comfort including Zofran  and Naprosyn  to help with pain and nausea and she was referred to local family doctors as needed.  Translator services used for this  interview     Final diagnoses:  Nausea and vomiting, unspecified vomiting type    ED Discharge Orders          Ordered    ondansetron  (ZOFRAN ) 4 MG tablet  Every  6 hours        09/26/24 2229    naproxen  (NAPROSYN ) 500 MG tablet  2 times daily with meals        09/26/24 2229               Cleotilde Rogue, MD 09/26/24 2230  "

## 2024-09-28 ENCOUNTER — Encounter (HOSPITAL_COMMUNITY): Payer: Self-pay

## 2024-09-28 ENCOUNTER — Emergency Department (HOSPITAL_COMMUNITY)
Admission: EM | Admit: 2024-09-28 | Discharge: 2024-09-28 | Discharge status: Against Medical Advice | Payer: Self-pay | Attending: Emergency Medicine | Admitting: Emergency Medicine

## 2024-09-28 ENCOUNTER — Other Ambulatory Visit: Payer: Self-pay

## 2024-09-28 DIAGNOSIS — R112 Nausea with vomiting, unspecified: Secondary | ICD-10-CM | POA: Insufficient documentation

## 2024-09-28 DIAGNOSIS — R1084 Generalized abdominal pain: Secondary | ICD-10-CM | POA: Insufficient documentation

## 2024-09-28 DIAGNOSIS — R300 Dysuria: Secondary | ICD-10-CM | POA: Insufficient documentation

## 2024-09-28 DIAGNOSIS — Z5321 Procedure and treatment not carried out due to patient leaving prior to being seen by health care provider: Secondary | ICD-10-CM | POA: Insufficient documentation

## 2024-09-28 DIAGNOSIS — M545 Low back pain, unspecified: Secondary | ICD-10-CM | POA: Insufficient documentation

## 2024-09-28 LAB — COMPREHENSIVE METABOLIC PANEL WITH GFR
ALT: 48 U/L — ABNORMAL HIGH (ref 0–44)
AST: 45 U/L — ABNORMAL HIGH (ref 15–41)
Albumin: 4.4 g/dL (ref 3.5–5.0)
Alkaline Phosphatase: 136 U/L — ABNORMAL HIGH (ref 38–126)
Anion gap: 13 (ref 5–15)
BUN: 11 mg/dL (ref 6–20)
CO2: 22 mmol/L (ref 22–32)
Calcium: 9.5 mg/dL (ref 8.9–10.3)
Chloride: 102 mmol/L (ref 98–111)
Creatinine, Ser: 0.68 mg/dL (ref 0.44–1.00)
GFR, Estimated: >60 mL/min
Glucose, Bld: 95 mg/dL (ref 70–99)
Potassium: 3.5 mmol/L (ref 3.5–5.1)
Sodium: 137 mmol/L (ref 135–145)
Total Bilirubin: 0.3 mg/dL (ref 0.0–1.2)
Total Protein: 7.6 g/dL (ref 6.5–8.1)

## 2024-09-28 LAB — URINE CULTURE

## 2024-09-28 LAB — CBC WITH DIFFERENTIAL/PLATELET
Abs Immature Granulocytes: 0.01 K/uL (ref 0.00–0.07)
Basophils Absolute: 0 K/uL (ref 0.0–0.1)
Basophils Relative: 1 %
Eosinophils Absolute: 0.1 K/uL (ref 0.0–0.5)
Eosinophils Relative: 1 %
HCT: 38.2 % (ref 36.0–46.0)
Hemoglobin: 12.6 g/dL (ref 12.0–15.0)
Immature Granulocytes: 0 %
Lymphocytes Relative: 36 %
Lymphs Abs: 2.7 K/uL (ref 0.7–4.0)
MCH: 30.4 pg (ref 26.0–34.0)
MCHC: 33 g/dL (ref 30.0–36.0)
MCV: 92 fL (ref 80.0–100.0)
Monocytes Absolute: 0.4 K/uL (ref 0.1–1.0)
Monocytes Relative: 5 %
Neutro Abs: 4.3 K/uL (ref 1.7–7.7)
Neutrophils Relative %: 57 %
Platelets: 276 K/uL (ref 150–400)
RBC: 4.15 MIL/uL (ref 3.87–5.11)
RDW: 12.5 % (ref 11.5–15.5)
WBC: 7.5 K/uL (ref 4.0–10.5)
nRBC: 0 % (ref 0.0–0.2)

## 2024-09-28 LAB — LIPASE, BLOOD: Lipase: 22 U/L (ref 11–51)

## 2024-09-28 LAB — URINALYSIS, ROUTINE W REFLEX MICROSCOPIC
Bilirubin Urine: NEGATIVE
Glucose, UA: NEGATIVE mg/dL
Hgb urine dipstick: NEGATIVE
Ketones, ur: NEGATIVE mg/dL
Leukocytes,Ua: NEGATIVE
Nitrite: NEGATIVE
Protein, ur: NEGATIVE mg/dL
Specific Gravity, Urine: 1.011 (ref 1.005–1.030)
pH: 7 (ref 5.0–8.0)

## 2024-09-28 LAB — HCG, QUANTITATIVE, PREGNANCY: hCG, Beta Chain, Quant, S: <1 m[IU]/mL

## 2024-09-28 NOTE — ED Provider Triage Note (Signed)
 Emergency Medicine Provider Triage Evaluation Note  Laureen Frederic , a 39 y.o. female  was evaluated in triage.  Pt complains of bilateral lower back pain. Reports pain for 4 days with nausea and vomiting. Has been seen mutliple times for this. Recently scanned yesterday.  Told she had a UTI.  Reports she is having nausea vomiting but no recorded fevers.  Still having some dysuria.  Review of Systems  Positive:  Negative:   Physical Exam  BP (!) 134/101 (BP Location: Right Arm)   Pulse 78   Temp 98.2 F (36.8 C)   Resp 16   Ht 5' (1.524 m)   Wt 80.3 kg   LMP 09/13/2024 (Approximate)   SpO2 100%   BMI 34.57 kg/m  Gen:   Awake, no distress   Resp:  Normal effort  MSK:   Moves extremities without difficulty  Other:  Abdomen soft nontender  Medical Decision Making  Medically screening exam initiated at 2:30 PM.  Appropriate orders placed.  Kelilah Perez-Matiano was informed that the remainder of the evaluation will be completed by another provider, this initial triage assessment does not replace that evaluation, and the importance of remaining in the ED until their evaluation is complete.  Labs ordered   Bernis Ernst, PA-C 09/28/24 1433

## 2024-09-28 NOTE — ED Notes (Signed)
 Called PT three times for vitals check and no response .SABRASABRASABRA

## 2024-09-28 NOTE — ED Triage Notes (Signed)
 Pt c/o generalized abdominal pain all over with vomiting starting 2 days ago. Was seen at [facility] for same on Saturday.

## 2024-09-28 NOTE — ED Triage Notes (Signed)
 Pt came in via POV d/t the last few days vomiting & being told at another ED that she had an infection in her kidneys & recently Tx for a UTI, does have bil flank pain, has been taking ABT but not able to hold it down when she takes it. A/Ox4, rates her pain 6/10 during triage.

## 2024-09-30 ENCOUNTER — Emergency Department (HOSPITAL_COMMUNITY)
Admission: EM | Admit: 2024-09-30 | Discharge: 2024-09-30 | Disposition: A | Discharge status: Home / Self Care | Payer: Self-pay

## 2024-09-30 ENCOUNTER — Encounter (HOSPITAL_COMMUNITY): Payer: Self-pay | Admitting: *Deleted

## 2024-09-30 ENCOUNTER — Other Ambulatory Visit: Payer: Self-pay

## 2024-09-30 DIAGNOSIS — K2970 Gastritis, unspecified, without bleeding: Secondary | ICD-10-CM | POA: Insufficient documentation

## 2024-09-30 DIAGNOSIS — G8929 Other chronic pain: Secondary | ICD-10-CM | POA: Insufficient documentation

## 2024-09-30 LAB — URINALYSIS, W/ REFLEX TO CULTURE (INFECTION SUSPECTED)
Bilirubin Urine: NEGATIVE
Glucose, UA: NEGATIVE mg/dL
Hgb urine dipstick: NEGATIVE
Ketones, ur: 20 mg/dL — AB
Leukocytes,Ua: NEGATIVE
Nitrite: NEGATIVE
Protein, ur: NEGATIVE mg/dL
Specific Gravity, Urine: 1.02 (ref 1.005–1.030)
pH: 6 (ref 5.0–8.0)

## 2024-09-30 LAB — CBC WITH DIFFERENTIAL/PLATELET
Abs Immature Granulocytes: 0.01 K/uL (ref 0.00–0.07)
Basophils Absolute: 0.1 K/uL (ref 0.0–0.1)
Basophils Relative: 1 %
Eosinophils Absolute: 0.1 K/uL (ref 0.0–0.5)
Eosinophils Relative: 1 %
HCT: 36.9 % (ref 36.0–46.0)
Hemoglobin: 12.4 g/dL (ref 12.0–15.0)
Immature Granulocytes: 0 %
Lymphocytes Relative: 40 %
Lymphs Abs: 3.5 K/uL (ref 0.7–4.0)
MCH: 30.2 pg (ref 26.0–34.0)
MCHC: 33.6 g/dL (ref 30.0–36.0)
MCV: 90 fL (ref 80.0–100.0)
Monocytes Absolute: 0.4 K/uL (ref 0.1–1.0)
Monocytes Relative: 5 %
Neutro Abs: 4.6 K/uL (ref 1.7–7.7)
Neutrophils Relative %: 53 %
Platelets: 314 K/uL (ref 150–400)
RBC: 4.1 MIL/uL (ref 3.87–5.11)
RDW: 12.4 % (ref 11.5–15.5)
WBC: 8.7 K/uL (ref 4.0–10.5)
nRBC: 0 % (ref 0.0–0.2)

## 2024-09-30 LAB — COMPREHENSIVE METABOLIC PANEL WITH GFR
ALT: 48 U/L — ABNORMAL HIGH (ref 0–44)
AST: 45 U/L — ABNORMAL HIGH (ref 15–41)
Albumin: 4.5 g/dL (ref 3.5–5.0)
Alkaline Phosphatase: 140 U/L — ABNORMAL HIGH (ref 38–126)
Anion gap: 14 (ref 5–15)
BUN: 17 mg/dL (ref 6–20)
CO2: 23 mmol/L (ref 22–32)
Calcium: 9.6 mg/dL (ref 8.9–10.3)
Chloride: 103 mmol/L (ref 98–111)
Creatinine, Ser: 0.94 mg/dL (ref 0.44–1.00)
GFR, Estimated: >60 mL/min
Glucose, Bld: 97 mg/dL (ref 70–99)
Potassium: 3.6 mmol/L (ref 3.5–5.1)
Sodium: 140 mmol/L (ref 135–145)
Total Bilirubin: 0.3 mg/dL (ref 0.0–1.2)
Total Protein: 7.9 g/dL (ref 6.5–8.1)

## 2024-09-30 LAB — HCG, SERUM, QUALITATIVE: Preg, Serum: NEGATIVE

## 2024-09-30 MED ORDER — METHOCARBAMOL 500 MG PO TABS: 1000.0000 mg | ORAL_TABLET | Freq: Three times a day (TID) | ORAL | 0 refills | Status: AC | PRN

## 2024-09-30 MED ORDER — PANTOPRAZOLE SODIUM 20 MG PO TBEC: 40.0000 mg | DELAYED_RELEASE_TABLET | Freq: Every day | ORAL | 0 refills | Status: DC

## 2024-09-30 MED ORDER — ONDANSETRON HCL 4 MG PO TABS: 4.0000 mg | ORAL_TABLET | Freq: Three times a day (TID) | ORAL | 0 refills | Status: DC | PRN

## 2024-09-30 MED ORDER — SUCRALFATE 1 G PO TABS
1.0000 g | ORAL_TABLET | Freq: Once | ORAL | Status: AC
  Administered 2024-09-30: 1 g via ORAL
  Filled 2024-09-30: qty 1

## 2024-09-30 MED ORDER — SUCRALFATE 1 G PO TABS: 1.0000 g | ORAL_TABLET | Freq: Three times a day (TID) | ORAL | 0 refills | Status: DC

## 2024-09-30 MED ORDER — METHOCARBAMOL 500 MG PO TABS
1000.0000 mg | ORAL_TABLET | Freq: Once | ORAL | Status: AC
  Administered 2024-09-30: 1000 mg via ORAL
  Filled 2024-09-30: qty 2

## 2024-09-30 MED ORDER — ONDANSETRON 4 MG PO TBDP
4.0000 mg | ORAL_TABLET | Freq: Once | ORAL | Status: AC
  Administered 2024-09-30: 4 mg via ORAL
  Filled 2024-09-30: qty 1

## 2024-09-30 MED ORDER — PANTOPRAZOLE SODIUM 40 MG PO TBEC
40.0000 mg | DELAYED_RELEASE_TABLET | Freq: Once | ORAL | Status: AC
  Administered 2024-09-30: 40 mg via ORAL
  Filled 2024-09-30: qty 1

## 2024-09-30 NOTE — ED Triage Notes (Signed)
 Via spanish interpreter, the patient states that she has had 3 days she has had nausea and vomiting after eating. She is having bilateral flank pain and lower mid abdominal pain. Painful urination. She says she was prescribed antibiotics, but the [facility] did not have my medicines.

## 2024-09-30 NOTE — Discharge Instructions (Signed)
 Call and follow-up with your primary care doctor.  Take your Carafate  and Protonix  as prescribed, and your Zofran  as needed for nausea and vomiting.  You can use Robaxin  as needed for pain.  A GI referral was placed in the ER.  They will call you to arrange a follow-up appointment.

## 2024-09-30 NOTE — ED Provider Notes (Signed)
 " [place] EMERGENCY DEPARTMENT AT [facility] Provider Note   CSN: [id number] Arrival date & time: 09/30/24  9795     Patient presents with: No chief complaint on file.   Lorraine [name] is a 39 y.o. female.   39 year old female presents for evaluation of abdominal pain.  This has been ongoing for quite some time.  She has had multiple ER visits for this with negative workups including 1 4 days ago.  She had negative CT scan a few days ago as well.  Patient describes crampy lower abdominal pain and sometimes upper abdominal pain and nausea as well.  States she has vomited a few times.  She states she does have some dysuria.  Denies any other symptoms or concerns.        Prior to Admission medications  Medication Sig Start Date End Date Taking? Authorizing Provider  methocarbamol  (ROBAXIN ) 500 MG tablet Take 2 tablets (1,000 mg total) by mouth every 8 (eight) hours as needed for up to 7 days for muscle spasms. 09/30/24 10/07/24 Yes Calyx Hawker L, DO  ondansetron  (ZOFRAN ) 4 MG tablet Take 1 tablet (4 mg total) by mouth every 8 (eight) hours as needed for up to 4 days. 09/30/24 10/04/24 Yes Destry Bezdek L, DO  pantoprazole  (PROTONIX ) 20 MG tablet Take 2 tablets (40 mg total) by mouth daily. 09/30/24 10/30/24 Yes Matheu Ploeger L, DO  sucralfate  (CARAFATE ) 1 g tablet Take 1 tablet (1 g total) by mouth 4 (four) times daily -  with meals and at bedtime. 09/30/24 10/10/24 Yes Zabian Swayne L, DO  acetaminophen  (TYLENOL  8 HOUR) 650 MG CR tablet Take 1 tablet (650 mg total) by mouth every 8 (eight) hours as needed for pain or fever. 07/19/24   Charlyn Sora, MD  albuterol  (VENTOLIN  HFA) 108 (90 Base) MCG/ACT inhaler Inhale 2 puffs into the lungs every 4 (four) hours as needed for wheezing or shortness of breath. 08/25/22   Suzette Pac, MD  ciprofloxacin  (CIPRO ) 500 MG tablet Take 1 tablet (500 mg total) by mouth every 12 (twelve) hours. 06/05/24   Raford Lenis, MD  dicyclomine   (BENTYL ) 20 MG tablet Take 1 tablet (20 mg total) by mouth 2 (two) times daily. 07/10/24   Smoot, Lauraine LABOR, PA-C  HYDROcodone -acetaminophen  (NORCO/VICODIN) 5-325 MG tablet Take 1 tablet by mouth every 4 (four) hours as needed. 09/25/24   Dean Clarity, MD  ibuprofen  (ADVIL ) 600 MG tablet Take 1 tablet (600 mg total) by mouth every 6 (six) hours as needed. 09/25/24   Dean Clarity, MD  ibuprofen  (ADVIL ) 800 MG tablet Take 1 tablet (800 mg total) by mouth every 8 (eight) hours as needed for moderate pain (pain score 4-6). 09/01/24   Suzette Pac, MD  levofloxacin  (LEVAQUIN ) 500 MG tablet Take 1 tablet (500 mg total) by mouth daily. X 7 days 09/01/24   Suzette Pac, MD  megestrol  (MEGACE ) 40 MG tablet Take 1 tablet (40 mg total) by mouth daily. 08/22/24   Ula Prentice SAUNDERS, MD  methocarbamol  (ROBAXIN ) 500 MG tablet Take 1 tablet (500 mg total) by mouth 2 (two) times daily. 08/13/24   Idol, Julie, PA-C  naproxen  (NAPROSYN ) 500 MG tablet Take 1 tablet (500 mg total) by mouth 2 (two) times daily with a meal. 09/26/24   Cleotilde Rogue, MD  ondansetron  (ZOFRAN ) 4 MG tablet Take 1 tablet (4 mg total) by mouth every 6 (six) hours. 09/26/24   Cleotilde Rogue, MD  pantoprazole  (PROTONIX ) 40 MG tablet Take 1 tablet (40  mg total) by mouth daily. 06/09/24   Suellen Cantor A, PA-C  phenazopyridine  (PYRIDIUM ) 200 MG tablet Take 1 tablet (200 mg total) by mouth 3 (three) times daily. 09/25/24   Dean Clarity, MD  potassium chloride  SA (KLOR-CON  M) 20 MEQ tablet Take 1 tablet (20 mEq total) by mouth 2 (two) times daily. 06/05/24   Raford Lenis, MD  sulfamethoxazole -trimethoprim  (BACTRIM  DS) 800-160 MG tablet Take 1 tablet by mouth 2 (two) times daily for 7 days. 09/25/24 10/02/24  Dean Clarity, MD  cetirizine  (ZYRTEC  ALLERGY) 10 MG tablet Take 1 tablet (10 mg total) by mouth 2 (two) times daily. Patient not taking: Reported on 12/06/2020 11/12/20 01/24/21  Muthersbaugh, Chiquita, PA-C    Allergies: Iodinated contrast media, Lupine  bean extract, Ceftriaxone , and Morphine     Review of Systems  Constitutional:  Negative for chills and fever.  HENT:  Negative for ear pain and sore throat.   Eyes:  Negative for pain and visual disturbance.  Respiratory:  Negative for cough and shortness of breath.   Cardiovascular:  Negative for chest pain and palpitations.  Gastrointestinal:  Positive for abdominal pain, nausea and vomiting.  Genitourinary:  Positive for dysuria. Negative for hematuria.  Musculoskeletal:  Negative for arthralgias and back pain.  Skin:  Negative for color change and rash.  Neurological:  Negative for seizures and syncope.  All other systems reviewed and are negative.   Updated Vital Signs BP 123/81   Pulse 70   Temp 98.1 F (36.7 C)   Resp 17   LMP 09/13/2024 (Approximate)   SpO2 100%   Physical Exam Vitals and nursing note reviewed.  Constitutional:      General: She is not in acute distress.    Appearance: Normal appearance. She is well-developed. She is not ill-appearing.  HENT:     Head: Normocephalic and atraumatic.  Eyes:     Conjunctiva/sclera: Conjunctivae normal.  Cardiovascular:     Rate and Rhythm: Normal rate and regular rhythm.     Heart sounds: No murmur heard. Pulmonary:     Effort: Pulmonary effort is normal. No respiratory distress.     Breath sounds: Normal breath sounds.  Abdominal:     General: There is no distension.     Palpations: Abdomen is soft. There is no mass.     Tenderness: There is no abdominal tenderness.     Hernia: No hernia is present.  Musculoskeletal:        General: No swelling.     Cervical back: Neck supple.  Skin:    General: Skin is warm and dry.     Capillary Refill: Capillary refill takes less than 2 seconds.  Neurological:     Mental Status: She is alert.  Psychiatric:        Mood and Affect: Mood normal.     (all labs ordered are listed, but only abnormal results are displayed) Labs Reviewed  COMPREHENSIVE METABOLIC PANEL WITH  GFR - Abnormal; Notable for the following components:      Result Value   AST 45 (*)    ALT 48 (*)    Alkaline Phosphatase 140 (*)    All other components within normal limits  URINALYSIS, W/ REFLEX TO CULTURE (INFECTION SUSPECTED) - Abnormal; Notable for the following components:   APPearance HAZY (*)    Ketones, ur 20 (*)    Bacteria, UA RARE (*)    All other components within normal limits  CBC WITH DIFFERENTIAL/PLATELET  HCG, SERUM, QUALITATIVE  EKG: None  Radiology: No results found.   Procedures   Medications Ordered in the ED  ondansetron  (ZOFRAN -ODT) disintegrating tablet 4 mg (4 mg Oral Given 09/30/24 0223)  sucralfate  (CARAFATE ) tablet 1 g (1 g Oral Given 09/30/24 1221)  pantoprazole  (PROTONIX ) EC tablet 40 mg (40 mg Oral Given 09/30/24 1221)  methocarbamol  (ROBAXIN ) tablet 1,000 mg (1,000 mg Oral Given 09/30/24 1221)                                    Medical Decision Making Social determinants of health: Poor follow-up, non-English-speaking  Patient here for ongoing abdominal pain.  She has had multiple ER visits for this with negative workup.  Never picked up her medications from the pharmacy after her last ER visit.  At her last visit she had normal labs and normal CT scan.  She again today has normal lab work.  Her vitals are stable.  She does not have any evidence of UTI.  I will give her a prescription for Carafate  and Protonix  as I think some of it may be GERD/gastritis related.  She is not very tender on exam.  Will give her prescription for Zofran  and Robaxin  to use as needed.  Placed a referral for GI and advised her to obtain close up with her primary care doctor.  Advised to return for new or worsening symptoms.  She feels comfortable being discharged home.  Spanish interpreter service was used throughout the duration of this visit.  Problems Addressed: Chronic abdominal pain: chronic illness or injury with exacerbation, progression, or side effects of  treatment Gastritis without bleeding, unspecified chronicity, unspecified gastritis type: acute illness or injury  Amount and/or Complexity of Data Reviewed External Data Reviewed: notes.    Details: Prior ED records from 1-10 reviewed and patient with negative workup and CT scan at that time Labs: ordered. Decision-making details documented in ED Course.    Details: Ordered and reviewed by me and unremarkable  Risk OTC drugs. Prescription drug management. Diagnosis or treatment significantly limited by social determinants of health.     Final diagnoses:  Gastritis without bleeding, unspecified chronicity, unspecified gastritis type  Chronic abdominal pain    ED Discharge Orders          Ordered    Ambulatory referral to Gastroenterology       Comments: Chronic abdominal pain, multiple ER visits with negative workup, nausea   09/30/24 1213    sucralfate  (CARAFATE ) 1 g tablet  3 times daily with meals & bedtime        09/30/24 1213    pantoprazole  (PROTONIX ) 20 MG tablet  Daily        09/30/24 1213    ondansetron  (ZOFRAN ) 4 MG tablet  Every 8 hours PRN        09/30/24 1213    methocarbamol  (ROBAXIN ) 500 MG tablet  Every 8 hours PRN        09/30/24 1213               Aleesa Sweigert L, DO 09/30/24 1240  "

## 2024-09-30 NOTE — ED Provider Triage Note (Signed)
 Emergency Medicine Provider Triage Evaluation Note  Lorraine [name] , a 39 y.o. female  was evaluated in triage.  Pt complains of abdominal pain, nausea, and vomiting.  Has been seen many times recently for same.  Just had CT on 09/26/24 which was normal.  Review of Systems  Positive: Abd pain, nausea, vomiting Negative: fever  Physical Exam  BP (!) 142/96   Pulse 81   Temp 98.2 F (36.8 C) (Oral)   Resp 16   LMP 09/13/2024 (Approximate)   Gen:   Awake, no distress   Resp:  Normal effort  MSK:   Moves extremities without difficulty  Other:  No active emesis in triage  Medical Decision Making  Medically screening exam initiated at 2:16 AM.  Appropriate orders placed.  Zenovia Perez-Matiano was informed that the remainder of the evaluation will be completed by another provider, this initial triage assessment does not replace that evaluation, and the importance of remaining in the ED until their evaluation is complete.  Abd pain, nausea/vomiting.  Multiple visits for same.  Neg CT 4 days ago.  Will repeat labs and UA.   Jarold Olam HERO, PA-C 09/30/24 [id number]

## 2024-10-04 ENCOUNTER — Emergency Department (HOSPITAL_COMMUNITY): Payer: Self-pay

## 2024-10-04 ENCOUNTER — Other Ambulatory Visit: Payer: Self-pay

## 2024-10-04 ENCOUNTER — Emergency Department (HOSPITAL_COMMUNITY)
Admission: EM | Admit: 2024-10-04 | Discharge: 2024-10-04 | Disposition: A | Discharge status: Home / Self Care | Payer: Self-pay | Attending: Emergency Medicine | Admitting: Emergency Medicine

## 2024-10-04 ENCOUNTER — Encounter (HOSPITAL_COMMUNITY): Payer: Self-pay

## 2024-10-04 DIAGNOSIS — R39851 Costovertebral (angle) tenderness, right side: Secondary | ICD-10-CM | POA: Insufficient documentation

## 2024-10-04 DIAGNOSIS — R10A1 Flank pain, right side: Secondary | ICD-10-CM | POA: Insufficient documentation

## 2024-10-04 LAB — COMPREHENSIVE METABOLIC PANEL WITH GFR
ALT: 48 U/L — ABNORMAL HIGH (ref 0–44)
AST: 41 U/L (ref 15–41)
Albumin: 4.1 g/dL (ref 3.5–5.0)
Alkaline Phosphatase: 122 U/L (ref 38–126)
Anion gap: 13 (ref 5–15)
BUN: 16 mg/dL (ref 6–20)
CO2: 22 mmol/L (ref 22–32)
Calcium: 9.5 mg/dL (ref 8.9–10.3)
Chloride: 105 mmol/L (ref 98–111)
Creatinine, Ser: 0.63 mg/dL (ref 0.44–1.00)
GFR, Estimated: >60 mL/min
Glucose, Bld: 102 mg/dL — ABNORMAL HIGH (ref 70–99)
Potassium: 3.7 mmol/L (ref 3.5–5.1)
Sodium: 140 mmol/L (ref 135–145)
Total Bilirubin: 0.2 mg/dL (ref 0.0–1.2)
Total Protein: 7.2 g/dL (ref 6.5–8.1)

## 2024-10-04 LAB — LIPASE, BLOOD: Lipase: 27 U/L (ref 11–51)

## 2024-10-04 LAB — CBC
HCT: 36.2 % (ref 36.0–46.0)
Hemoglobin: 11.9 g/dL — ABNORMAL LOW (ref 12.0–15.0)
MCH: 30.4 pg (ref 26.0–34.0)
MCHC: 32.9 g/dL (ref 30.0–36.0)
MCV: 92.3 fL (ref 80.0–100.0)
Platelets: 290 K/uL (ref 150–400)
RBC: 3.92 MIL/uL (ref 3.87–5.11)
RDW: 12.4 % (ref 11.5–15.5)
WBC: 7 K/uL (ref 4.0–10.5)
nRBC: 0 % (ref 0.0–0.2)

## 2024-10-04 LAB — URINALYSIS, ROUTINE W REFLEX MICROSCOPIC
Bilirubin Urine: NEGATIVE
Glucose, UA: NEGATIVE mg/dL
Hgb urine dipstick: NEGATIVE
Ketones, ur: NEGATIVE mg/dL
Leukocytes,Ua: NEGATIVE
Nitrite: NEGATIVE
Protein, ur: NEGATIVE mg/dL
Specific Gravity, Urine: 1.019 (ref 1.005–1.030)
pH: 6 (ref 5.0–8.0)

## 2024-10-04 LAB — HCG, SERUM, QUALITATIVE: Preg, Serum: NEGATIVE

## 2024-10-04 MED ORDER — DICYCLOMINE HCL 20 MG PO TABS: 20.0000 mg | ORAL_TABLET | Freq: Two times a day (BID) | ORAL | 0 refills | Status: DC

## 2024-10-04 MED ORDER — KETOROLAC TROMETHAMINE 30 MG/ML IJ SOLN
15.0000 mg | Freq: Once | INTRAMUSCULAR | Status: AC
  Administered 2024-10-04: 15 mg via INTRAVENOUS
  Filled 2024-10-04: qty 1

## 2024-10-04 MED ORDER — ACETAMINOPHEN 500 MG PO TABS
ORAL_TABLET | ORAL | Status: AC
  Administered 2024-10-04: 1000 mg via ORAL
  Filled 2024-10-04: qty 2

## 2024-10-04 MED ORDER — ONDANSETRON HCL 4 MG/2ML IJ SOLN
4.0000 mg | Freq: Once | INTRAMUSCULAR | Status: AC
  Administered 2024-10-04: 4 mg via INTRAVENOUS
  Filled 2024-10-04: qty 2

## 2024-10-04 MED ORDER — ACETAMINOPHEN 500 MG PO TABS
1000.0000 mg | ORAL_TABLET | Freq: Once | ORAL | Status: AC
  Filled 2024-10-04: qty 2

## 2024-10-04 MED ORDER — HYDROMORPHONE HCL 1 MG/ML IJ SOLN: 0.5000 mg | Freq: Once | INTRAMUSCULAR | Status: DC

## 2024-10-04 MED ORDER — ACETAMINOPHEN ER 650 MG PO TBCR: 650.0000 mg | EXTENDED_RELEASE_TABLET | Freq: Three times a day (TID) | ORAL | 0 refills | Status: AC | PRN

## 2024-10-04 MED ORDER — ONDANSETRON HCL 4 MG PO TABS: 4.0000 mg | ORAL_TABLET | Freq: Three times a day (TID) | ORAL | 0 refills | Status: AC | PRN

## 2024-10-04 NOTE — Discharge Instructions (Signed)
 Thank you for visiting the Emergency Department today. It was a pleasure to be part of your healthcare team.   Your were seen today for right sided flank pain  As discussed, continue to hydrate, resume diet as tolerated, utilize Tylenol  and ibuprofen  as needed for pain management.  You should follow-up with OB/GYN regarding your tubal ligation.  It is important to watch for warning signs such as worsening pain, fever, trouble breathing, chest pain. If any of these happen, return to the Emergency Department or call 911.  Thank you for trusting us  with your health.

## 2024-10-04 NOTE — ED Provider Notes (Signed)
 " [place] [facility] Provider Note   CSN: [id number] Arrival date & time: 10/04/24  0830     Patient presents with: Flank Pain (/) and Dizziness   Asna Muldrow is a 39 y.o. female who presents with right flank symptoms for the past few days. The patient states that the pain in wax/wane and non-radiating with associated nausea. The patient reports no dysuria, urinary frequency, urgency, or suprapubic discomfort. She denies fevers, vomiting or diarrhea. There is no history of recent urological procedures.  The patient reports prior episodes of nephrolithiasis. Oral intake has been normal and pain control at home has not been adequate. The patient is in no acute distress.    Flank Pain  Dizziness      Prior to Admission medications  Medication Sig Start Date End Date Taking? Authorizing Provider  acetaminophen  (TYLENOL  8 HOUR) 650 MG CR tablet Take 1 tablet (650 mg total) by mouth every 8 (eight) hours as needed for pain or fever. 10/04/24   Willma Duwaine CROME, PA  albuterol  (VENTOLIN  HFA) 108 (90 Base) MCG/ACT inhaler Inhale 2 puffs into the lungs every 4 (four) hours as needed for wheezing or shortness of breath. 08/25/22   Suzette Pac, MD  ciprofloxacin  (CIPRO ) 500 MG tablet Take 1 tablet (500 mg total) by mouth every 12 (twelve) hours. 06/05/24   Raford Lenis, MD  dicyclomine  (BENTYL ) 20 MG tablet Take 1 tablet (20 mg total) by mouth 2 (two) times daily. 10/04/24   Austina Constantin L, PA  ibuprofen  (ADVIL ) 600 MG tablet Take 1 tablet (600 mg total) by mouth every 6 (six) hours as needed. 09/25/24   Dean Clarity, MD  ibuprofen  (ADVIL ) 800 MG tablet Take 1 tablet (800 mg total) by mouth every 8 (eight) hours as needed for moderate pain (pain score 4-6). 09/01/24   Suzette Pac, MD  levofloxacin  (LEVAQUIN ) 500 MG tablet Take 1 tablet (500 mg total) by mouth daily. X 7 days 09/01/24   Suzette Pac, MD  megestrol  (MEGACE ) 40 MG tablet Take 1 tablet (40  mg total) by mouth daily. 08/22/24   Ula Prentice SAUNDERS, MD  methocarbamol  (ROBAXIN ) 500 MG tablet Take 1 tablet (500 mg total) by mouth 2 (two) times daily. 08/13/24   Idol, Julie, PA-C  naproxen  (NAPROSYN ) 500 MG tablet Take 1 tablet (500 mg total) by mouth 2 (two) times daily with a meal. 09/26/24   Cleotilde Rogue, MD  ondansetron  (ZOFRAN -ODT) 4 MG disintegrating tablet Take 1 tablet (4 mg total) by mouth every 8 (eight) hours as needed for nausea or vomiting. 10/09/24   Minnie Tinnie BRAVO, PA  pantoprazole  (PROTONIX ) 20 MG tablet Take 2 tablets (40 mg total) by mouth daily. 09/30/24 10/30/24  Kammerer, Brylan Dec L, DO  pantoprazole  (PROTONIX ) 40 MG tablet Take 1 tablet (40 mg total) by mouth daily. 06/09/24   Suellen Cantor A, PA-C  phenazopyridine  (PYRIDIUM ) 200 MG tablet Take 1 tablet (200 mg total) by mouth 3 (three) times daily. 09/25/24   Dean Clarity, MD  sucralfate  (CARAFATE ) 1 g tablet Take 1 tablet (1 g total) by mouth 4 (four) times daily -  with meals and at bedtime. 09/30/24 10/10/24  Kammerer, Drakkar Medeiros L, DO  cetirizine  (ZYRTEC  ALLERGY) 10 MG tablet Take 1 tablet (10 mg total) by mouth 2 (two) times daily. Patient not taking: Reported on 12/06/2020 11/12/20 01/24/21  Muthersbaugh, Chiquita, PA-C    Allergies: Iodinated contrast media, Lupine bean extract, Ceftriaxone , and Morphine     Review of  Systems  Genitourinary:  Positive for flank pain.    Updated Vital Signs BP 108/66   Pulse 60   Temp 98.6 F (37 C) (Oral)   Resp 16   LMP 09/13/2024 (Approximate)   SpO2 100%   Physical Exam Constitutional:      General: She is awake. She is not in acute distress.    Appearance: She is not toxic-appearing.  HENT:     Head: Normocephalic and atraumatic.     Right Ear: Hearing normal.     Left Ear: Hearing normal.     Nose: Nose normal.  Eyes:     Extraocular Movements: Extraocular movements intact.     Conjunctiva/sclera: Conjunctivae normal.     Pupils: Pupils are equal, round, and reactive to  light.  Cardiovascular:     Rate and Rhythm: Normal rate and regular rhythm.     Pulses: Normal pulses.  Pulmonary:     Effort: Pulmonary effort is normal.  Abdominal:     General: Abdomen is flat. Bowel sounds are normal.     Palpations: Abdomen is soft.     Tenderness: There is no abdominal tenderness. There is right CVA tenderness.     Comments: Right CVA tenderness to palpation. No abdominal tenderness. No guarding or rebound. No hernia appreciated on exam. There is no ecchymosis or additional skin changes to the abdomen.   Musculoskeletal:        General: Normal range of motion.     Cervical back: Normal range of motion.  Skin:    General: Skin is warm and dry.     Capillary Refill: Capillary refill takes less than 2 seconds.  Neurological:     General: No focal deficit present.     Mental Status: She is alert.  Psychiatric:        Mood and Affect: Mood normal.     (all labs ordered are listed, but only abnormal results are displayed) Labs Reviewed  COMPREHENSIVE METABOLIC PANEL WITH GFR - Abnormal; Notable for the following components:      Result Value   Glucose, Bld 102 (*)    ALT 48 (*)    All other components within normal limits  CBC - Abnormal; Notable for the following components:   Hemoglobin 11.9 (*)    All other components within normal limits  LIPASE, BLOOD  URINALYSIS, ROUTINE W REFLEX MICROSCOPIC  HCG, SERUM, QUALITATIVE    EKG: None  Radiology:   Procedures   Medications Ordered in the ED  ondansetron  (ZOFRAN ) injection 4 mg (4 mg Intravenous Given 10/04/24 1120)  ketorolac  (TORADOL ) 30 MG/ML injection 15 mg (15 mg Intravenous Given 10/04/24 1120)  acetaminophen  (TYLENOL ) tablet 1,000 mg (1,000 mg Oral Given 10/04/24 1224)                                    Medical Decision Making Amount and/or Complexity of Data Reviewed Labs: ordered. Radiology: ordered.   Patient presents to the ED for: right flank pain  This involves an extensive  number of treatment options  Differential diagnosis includes:  Urolithiasis  Minor, MSK etiology Other, gastrointestinal etiology Co-morbid conditions: Diverticulitis, pancreatitis, pyelonephritis  Additional history/records obtained and reviewed: Additional history obtained from Spanish interpreter  External records from outside source obtained and reviewed including several emergency department visits for same complaint.  Data Reviewed / Actions Taken: Labs ordered -without acute finding - in ED course above.  Imaging ordered -CT abdomen showed no acute intra-abdominal pathology or urolithiasis -  I agree with the radiologists interpretation.   Management / Treatments: Patient given ondansetron , ketorolac , and acetaminophen  for pain relief-well-tolerated Reevaluation of the patient after these medicines showed that the patient improved . I have reviewed the patients home medicines and have made adjustments as needed  ED Course / Reassessments: Problem List:  39 year old female presented for right flank pain. Initial assessment included history, physical exam, and review of prior medical records. Laboratory evaluation and CT imaging were both without acute finding. Based on the patient's reassuring vital signs, physical exam findings, and overall reassuring workup, there is low clinical suspicion for nephrolithiasis, pyelonephritis, cystitis, acute surgical abdomen, bowel obstruction, appendicitis, cholecystitis, pancreatitis, or other emergent intra-abdominal process.  The patient was treated symptomatically with antiemetics and analgesic therapy with improvement of symptoms during the ED course.  The patient remained clinically stable and deemed appropriate for outpatient management - discomfort likely viral process or minor musculoskeletal in nature.  Discharge instructions included strict return precautions for worsening or localized abdominal/flank pain, persistent vomiting, fever,  inability to tolerate oral intake, or new concerning symptoms.   Social determinants impacting care: language barrier  Disposition: Disposition: Discharge with close follow-up with PCP for further evaluation and care Rationale for disposition: Stable for discharge The disposition plan and rationale were discussed with the patient at the bedside, all questions were addressed, and the patient demonstrated understanding.  This note was produced using Electronics Engineer. While I have reviewed and verified all clinical information, transcription errors may remain.      Final diagnoses:  Right flank pain    ED Discharge Orders          Ordered    acetaminophen  (TYLENOL  8 HOUR) 650 MG CR tablet  Every 8 hours PRN        10/04/24 1257    dicyclomine  (BENTYL ) 20 MG tablet  2 times daily        10/04/24 1257    ondansetron  (ZOFRAN ) 4 MG tablet  Every 8 hours PRN        10/04/24 1257               Aleta Manternach L, [place] 10/10/24 2238  "

## 2024-10-04 NOTE — ED Notes (Signed)
 Patient transported to CT

## 2024-10-04 NOTE — ED Notes (Signed)
 Awaiting pt from lobby

## 2024-10-04 NOTE — ED Triage Notes (Signed)
 Pt bib pov with son c/o right side flank pain with dizziness that started last night.

## 2024-10-08 ENCOUNTER — Emergency Department (HOSPITAL_COMMUNITY)
Admission: EM | Admit: 2024-10-08 | Discharge: 2024-10-08 | Disposition: A | Discharge status: Home / Self Care | Payer: Self-pay | Attending: Emergency Medicine | Admitting: Emergency Medicine

## 2024-10-08 ENCOUNTER — Encounter (HOSPITAL_COMMUNITY): Payer: Self-pay

## 2024-10-08 ENCOUNTER — Other Ambulatory Visit: Payer: Self-pay

## 2024-10-08 ENCOUNTER — Emergency Department (HOSPITAL_COMMUNITY): Payer: Self-pay

## 2024-10-08 ENCOUNTER — Emergency Department (HOSPITAL_COMMUNITY): Admission: EM | Admit: 2024-10-08 | Discharge: 2024-10-09 | Discharge status: Against Medical Advice | Payer: Self-pay

## 2024-10-08 DIAGNOSIS — E876 Hypokalemia: Secondary | ICD-10-CM | POA: Insufficient documentation

## 2024-10-08 DIAGNOSIS — Z5321 Procedure and treatment not carried out due to patient leaving prior to being seen by health care provider: Secondary | ICD-10-CM | POA: Insufficient documentation

## 2024-10-08 DIAGNOSIS — R1013 Epigastric pain: Secondary | ICD-10-CM | POA: Insufficient documentation

## 2024-10-08 DIAGNOSIS — R739 Hyperglycemia, unspecified: Secondary | ICD-10-CM

## 2024-10-08 DIAGNOSIS — R7309 Other abnormal glucose: Secondary | ICD-10-CM | POA: Insufficient documentation

## 2024-10-08 DIAGNOSIS — R197 Diarrhea, unspecified: Secondary | ICD-10-CM | POA: Insufficient documentation

## 2024-10-08 DIAGNOSIS — D649 Anemia, unspecified: Secondary | ICD-10-CM | POA: Insufficient documentation

## 2024-10-08 DIAGNOSIS — R1031 Right lower quadrant pain: Secondary | ICD-10-CM | POA: Insufficient documentation

## 2024-10-08 DIAGNOSIS — R1011 Right upper quadrant pain: Secondary | ICD-10-CM | POA: Insufficient documentation

## 2024-10-08 DIAGNOSIS — R112 Nausea with vomiting, unspecified: Secondary | ICD-10-CM | POA: Insufficient documentation

## 2024-10-08 DIAGNOSIS — J45909 Unspecified asthma, uncomplicated: Secondary | ICD-10-CM | POA: Insufficient documentation

## 2024-10-08 DIAGNOSIS — R748 Abnormal levels of other serum enzymes: Secondary | ICD-10-CM | POA: Insufficient documentation

## 2024-10-08 LAB — CBC WITH DIFFERENTIAL/PLATELET
Abs Immature Granulocytes: 0.02 K/uL (ref 0.00–0.07)
Abs Immature Granulocytes: 0.02 K/uL (ref 0.00–0.07)
Basophils Absolute: 0.1 K/uL (ref 0.0–0.1)
Basophils Absolute: 0.1 K/uL (ref 0.0–0.1)
Basophils Relative: 1 %
Basophils Relative: 1 %
Eosinophils Absolute: 0.2 K/uL (ref 0.0–0.5)
Eosinophils Absolute: 0 K/uL (ref 0.0–0.5)
Eosinophils Relative: 2 %
Eosinophils Relative: 0 %
HCT: 33.8 % — ABNORMAL LOW (ref 36.0–46.0)
HCT: 36.5 % (ref 36.0–46.0)
Hemoglobin: 11.1 g/dL — ABNORMAL LOW (ref 12.0–15.0)
Hemoglobin: 12 g/dL (ref 12.0–15.0)
Immature Granulocytes: 0 %
Immature Granulocytes: 0 %
Lymphocytes Relative: 35 %
Lymphocytes Relative: 34 %
Lymphs Abs: 3.1 K/uL (ref 0.7–4.0)
Lymphs Abs: 2.7 K/uL (ref 0.7–4.0)
MCH: 30.1 pg (ref 26.0–34.0)
MCH: 30.3 pg (ref 26.0–34.0)
MCHC: 32.8 g/dL (ref 30.0–36.0)
MCHC: 32.9 g/dL (ref 30.0–36.0)
MCV: 91.6 fL (ref 80.0–100.0)
MCV: 92.2 fL (ref 80.0–100.0)
Monocytes Absolute: 0.5 K/uL (ref 0.1–1.0)
Monocytes Absolute: 0.4 K/uL (ref 0.1–1.0)
Monocytes Relative: 6 %
Monocytes Relative: 5 %
Neutro Abs: 5 K/uL (ref 1.7–7.7)
Neutro Abs: 4.8 K/uL (ref 1.7–7.7)
Neutrophils Relative %: 56 %
Neutrophils Relative %: 60 %
Platelets: 273 K/uL (ref 150–400)
Platelets: 298 K/uL (ref 150–400)
RBC: 3.69 MIL/uL — ABNORMAL LOW (ref 3.87–5.11)
RBC: 3.96 MIL/uL (ref 3.87–5.11)
RDW: 12.5 % (ref 11.5–15.5)
RDW: 12.6 % (ref 11.5–15.5)
WBC: 8.8 K/uL (ref 4.0–10.5)
WBC: 7.9 K/uL (ref 4.0–10.5)
nRBC: 0 % (ref 0.0–0.2)
nRBC: 0 % (ref 0.0–0.2)

## 2024-10-08 LAB — COMPREHENSIVE METABOLIC PANEL WITH GFR
ALT: 44 U/L (ref 0–44)
AST: 34 U/L (ref 15–41)
Albumin: 4 g/dL (ref 3.5–5.0)
Alkaline Phosphatase: 131 U/L — ABNORMAL HIGH (ref 38–126)
Anion gap: 11 (ref 5–15)
BUN: 16 mg/dL (ref 6–20)
CO2: 24 mmol/L (ref 22–32)
Calcium: 8.6 mg/dL — ABNORMAL LOW (ref 8.9–10.3)
Chloride: 103 mmol/L (ref 98–111)
Creatinine, Ser: 0.72 mg/dL (ref 0.44–1.00)
GFR, Estimated: >60 mL/min
Glucose, Bld: 125 mg/dL — ABNORMAL HIGH (ref 70–99)
Potassium: 3.3 mmol/L — ABNORMAL LOW (ref 3.5–5.1)
Sodium: 138 mmol/L (ref 135–145)
Total Bilirubin: <0.2 mg/dL (ref 0.0–1.2)
Total Protein: 6.7 g/dL (ref 6.5–8.1)

## 2024-10-08 LAB — BASIC METABOLIC PANEL WITH GFR
Anion gap: 11 (ref 5–15)
BUN: 11 mg/dL (ref 6–20)
CO2: 24 mmol/L (ref 22–32)
Calcium: 8.9 mg/dL (ref 8.9–10.3)
Chloride: 103 mmol/L (ref 98–111)
Creatinine, Ser: 0.69 mg/dL (ref 0.44–1.00)
GFR, Estimated: >60 mL/min
Glucose, Bld: 90 mg/dL (ref 70–99)
Potassium: 3.9 mmol/L (ref 3.5–5.1)
Sodium: 139 mmol/L (ref 135–145)

## 2024-10-08 LAB — LIPASE, BLOOD: Lipase: 33 U/L (ref 11–51)

## 2024-10-08 MED ORDER — SODIUM CHLORIDE 0.9 % IV BOLUS
1000.0000 mL | Freq: Once | INTRAVENOUS | Status: AC
  Administered 2024-10-08: 1000 mL via INTRAVENOUS

## 2024-10-08 MED ORDER — ONDANSETRON HCL 4 MG/2ML IJ SOLN
4.0000 mg | Freq: Once | INTRAMUSCULAR | Status: AC
  Administered 2024-10-08: 4 mg via INTRAVENOUS
  Filled 2024-10-08: qty 2

## 2024-10-08 MED ORDER — HYDROMORPHONE HCL 1 MG/ML IJ SOLN
0.5000 mg | Freq: Once | INTRAMUSCULAR | Status: AC
  Administered 2024-10-08: 0.5 mg via INTRAVENOUS
  Filled 2024-10-08: qty 0.5

## 2024-10-08 MED ORDER — PANTOPRAZOLE SODIUM 40 MG IV SOLR
40.0000 mg | Freq: Once | INTRAVENOUS | Status: AC
  Administered 2024-10-08: 40 mg via INTRAVENOUS
  Filled 2024-10-08: qty 10

## 2024-10-08 MED ORDER — OXYCODONE-ACETAMINOPHEN 5-325 MG PO TABS
2.0000 | ORAL_TABLET | Freq: Once | ORAL | Status: AC
  Administered 2024-10-08: 2 via ORAL
  Filled 2024-10-08: qty 2

## 2024-10-08 MED ORDER — PROCHLORPERAZINE EDISYLATE 10 MG/2ML IJ SOLN
10.0000 mg | Freq: Once | INTRAMUSCULAR | Status: AC
  Administered 2024-10-08: 10 mg via INTRAVENOUS
  Filled 2024-10-08: qty 2

## 2024-10-08 MED ORDER — ONDANSETRON 4 MG PO TBDP
8.0000 mg | ORAL_TABLET | Freq: Once | ORAL | Status: AC
  Administered 2024-10-08: 8 mg via ORAL
  Filled 2024-10-08: qty 2

## 2024-10-08 MED ORDER — LOPERAMIDE HCL 2 MG PO CAPS
4.0000 mg | ORAL_CAPSULE | Freq: Once | ORAL | Status: AC
  Administered 2024-10-08: 4 mg via ORAL
  Filled 2024-10-08: qty 2

## 2024-10-08 MED ORDER — HYDROMORPHONE HCL 1 MG/ML IJ SOLN
1.0000 mg | Freq: Once | INTRAMUSCULAR | Status: AC
  Administered 2024-10-08: 1 mg via INTRAVENOUS
  Filled 2024-10-08: qty 1

## 2024-10-08 NOTE — ED Triage Notes (Signed)
 Pt arrived POV from home c/o RLQ abdominal pain. Pt was seen at [facility] and discharged with no meds per family. Per pt pain is worse and she had had N/V all day.

## 2024-10-08 NOTE — Discharge Instructions (Signed)
 You were seen in the emergency department for abdominal pain.  You had lab work and a CAT scan that did not show an obvious explanation for your symptoms.  Please continue pantoprazole  daily.  Maalox or Tums in between meals and at bedtime.  It will be important to establish care with a primary care doctor.  You should also follow-up with gastroenterology.

## 2024-10-08 NOTE — ED Provider Triage Note (Signed)
 Emergency Medicine Provider Triage Evaluation Note  Lorraine [name] , a 39 y.o. female  was evaluated in triage.  Pt complains of right lower quadrant abdominal pain, does not radiate, is associated with increased diarrhea.  Seen at [facility] this morning for upper right abdominal pain, CT imaging obtained at that time did not demonstrate any acute abnormalities, she did not have a leukocytosis.  States that in the intervening time the pain has worsened in the right lower quadrant, and is also associated with nausea and vomiting.  She states that she is allergic to IV contrast with hives and itching is the documented reaction, is also allergic to morphine  with rash as a noted reaction.  Review of Systems  Positive: As above Negative:   Physical Exam  BP 120/71   Pulse 70   Temp 97.7 F (36.5 C)   Resp 16   Ht 5' (1.524 m)   Wt 78 kg   LMP 09/13/2024 (Approximate)   SpO2 98%   BMI 33.59 kg/m  Gen:   Awake, no distress   Resp:  Normal effort  MSK:   Moves extremities without difficulty  Other:  Positive Rovsing sign, positive psoas sign, abdomen diffusely tender with increased tenderness of McBurney's point.  Medical Decision Making  Medically screening exam initiated at 4:42 PM.  Appropriate orders placed.  Lorraine [name] was informed that the remainder of the evaluation will be completed by another provider, this initial triage assessment does not replace that evaluation, and the importance of remaining in the ED until their evaluation is complete.  Initial labs and imaging obtained, she has had a recent CT scan and has allergy to contrast so ordered ultrasound imaging at this time to reevaluate for possible acute appendicitis.   Myriam Dorn BROCKS, [place] 10/08/24 [phone]

## 2024-10-08 NOTE — ED Triage Notes (Signed)
 Pt pov from home c/o abdominal pain in RLQ that started tonight. Pt reports n/v and diarrhea.

## 2024-10-08 NOTE — ED Notes (Signed)
 Patient transported to CT

## 2024-10-08 NOTE — ED Triage Notes (Signed)
 C/O right sided abd pain since this morning. Pt states having diarrhea 6 times today. No urinary issues. Pt states 7 vomiting episodes since this morning. Denies CP and SHOB. Axox4.

## 2024-10-08 NOTE — ED Provider Notes (Signed)
 " [place] EMERGENCY DEPARTMENT AT [facility] Provider Note   CSN: [id number] Arrival date & time: 10/08/24  9658     Patient presents with: Abdominal Pain   Lorraine [name] is a 39 y.o. female.   The history is provided by the patient. A language interpreter was used.  Abdominal Pain  She has history of diverticulitis, pancreatitis, pyelonephritis, asthma and is postcholecystectomy and comes in complaining of epigastric and right upper quadrant pain which started tonight.  There is associated nausea, vomiting, diarrhea.  She denies fever but has had some chills.  She denies any sick contacts.    Prior to Admission medications  Medication Sig Start Date End Date Taking? Authorizing Provider  acetaminophen  (TYLENOL  8 HOUR) 650 MG CR tablet Take 1 tablet (650 mg total) by mouth every 8 (eight) hours as needed for pain or fever. 10/04/24   Willma Duwaine CROME, PA  albuterol  (VENTOLIN  HFA) 108 (90 Base) MCG/ACT inhaler Inhale 2 puffs into the lungs every 4 (four) hours as needed for wheezing or shortness of breath. 08/25/22   Suzette Pac, MD  ciprofloxacin  (CIPRO ) 500 MG tablet Take 1 tablet (500 mg total) by mouth every 12 (twelve) hours. 06/05/24   Raford Lenis, MD  dicyclomine  (BENTYL ) 20 MG tablet Take 1 tablet (20 mg total) by mouth 2 (two) times daily. 10/04/24   Lloyd, Megan L, PA  ibuprofen  (ADVIL ) 600 MG tablet Take 1 tablet (600 mg total) by mouth every 6 (six) hours as needed. 09/25/24   Dean Clarity, MD  ibuprofen  (ADVIL ) 800 MG tablet Take 1 tablet (800 mg total) by mouth every 8 (eight) hours as needed for moderate pain (pain score 4-6). 09/01/24   Suzette Pac, MD  levofloxacin  (LEVAQUIN ) 500 MG tablet Take 1 tablet (500 mg total) by mouth daily. X 7 days 09/01/24   Suzette Pac, MD  megestrol  (MEGACE ) 40 MG tablet Take 1 tablet (40 mg total) by mouth daily. 08/22/24   Ula Prentice SAUNDERS, MD  methocarbamol  (ROBAXIN ) 500 MG tablet Take 1 tablet (500 mg total) by mouth 2  (two) times daily. 08/13/24   Idol, Julie, PA-C  naproxen  (NAPROSYN ) 500 MG tablet Take 1 tablet (500 mg total) by mouth 2 (two) times daily with a meal. 09/26/24   Cleotilde Rogue, MD  ondansetron  (ZOFRAN ) 4 MG tablet Take 1 tablet (4 mg total) by mouth every 8 (eight) hours as needed for up to 4 days. 10/04/24 10/08/24  Lloyd, Megan L, PA  pantoprazole  (PROTONIX ) 20 MG tablet Take 2 tablets (40 mg total) by mouth daily. 09/30/24 10/30/24  Kammerer, Megan L, DO  pantoprazole  (PROTONIX ) 40 MG tablet Take 1 tablet (40 mg total) by mouth daily. 06/09/24   Suellen Cantor A, PA-C  phenazopyridine  (PYRIDIUM ) 200 MG tablet Take 1 tablet (200 mg total) by mouth 3 (three) times daily. 09/25/24   Dean Clarity, MD  sucralfate  (CARAFATE ) 1 g tablet Take 1 tablet (1 g total) by mouth 4 (four) times daily -  with meals and at bedtime. 09/30/24 10/10/24  Kammerer, Megan L, DO  cetirizine  (ZYRTEC  ALLERGY) 10 MG tablet Take 1 tablet (10 mg total) by mouth 2 (two) times daily. Patient not taking: Reported on 12/06/2020 11/12/20 01/24/21  Muthersbaugh, Chiquita, PA-C    Allergies: Iodinated contrast media, Lupine bean extract, Ceftriaxone , and Morphine     Review of Systems  Gastrointestinal:  Positive for abdominal pain.  All other systems reviewed and are negative.   Updated Vital Signs BP 110/89 (BP Location:  Left Arm)   Pulse 81   Temp 98.3 F (36.8 C) (Oral)   Resp 16   Ht 5' (1.524 m)   Wt 80.3 kg   LMP 09/13/2024 (Approximate)   SpO2 97%   BMI 34.57 kg/m   Physical Exam Vitals and nursing note reviewed.   39 year old female, resting comfortably and in no acute distress. Vital signs are normal. Oxygen  saturation is 97%, which is normal. Head is normocephalic and atraumatic. PERRLA, EOMI.  Lungs are clear without rales, wheezes, or rhonchi. Heart has regular rate and rhythm without murmur. Abdomen is soft, flat, with moderate epigastric tenderness.  There is no rebound or guarding. Skin is warm and dry  without rash. Neurologic: Mental status is normal, cranial nerves are intact, moves all extremities equally.  (all labs ordered are listed, but only abnormal results are displayed) Labs Reviewed  COMPREHENSIVE METABOLIC PANEL WITH GFR - Abnormal; Notable for the following components:      Result Value   Potassium 3.3 (*)    Glucose, Bld 125 (*)    Calcium  8.6 (*)    Alkaline Phosphatase 131 (*)    All other components within normal limits  CBC WITH DIFFERENTIAL/PLATELET - Abnormal; Notable for the following components:   RBC 3.69 (*)    Hemoglobin 11.1 (*)    HCT 33.8 (*)    All other components within normal limits  LIPASE, BLOOD     Radiology: No results found.   Procedures   Medications Ordered in the ED  prochlorperazine  (COMPAZINE ) injection 10 mg (has no administration in time range)  sodium chloride  0.9 % bolus 1,000 mL (0 mLs Intravenous Stopped 10/08/24 0516)  ondansetron  (ZOFRAN ) injection 4 mg (4 mg Intravenous Given 10/08/24 0437)  HYDROmorphone  (DILAUDID ) injection 0.5 mg (0.5 mg Intravenous Given 10/08/24 0435)  pantoprazole  (PROTONIX ) injection 40 mg (40 mg Intravenous Given 10/08/24 0438)  loperamide  (IMODIUM ) capsule 4 mg (4 mg Oral Given 10/08/24 0442)  HYDROmorphone  (DILAUDID ) injection 1 mg (1 mg Intravenous Given 10/08/24 0538)                                    Medical Decision Making Amount and/or Complexity of Data Reviewed Labs: ordered. Radiology: ordered.  Risk Prescription drug management.   Abdominal pain, nausea, vomiting, diarrhea.  Presentation is suggestive of viral gastroenteritis.  Differential diagnosis also includes, but is not limited to, pancreatitis, peptic ulcer disease, diverticulitis.  I have reviewed her past records, and I note that she had an ED visit on 10/04/2024 for right flank pain at which time CT scan was unremarkable.  Exam is benign, I do not feel she needs repeat CT scan.  I have ordered screening labs and IV fluids as  well as morphine  for pain, ondansetron  for nausea, loperamide  for diarrhea.  She had relief of nausea with above-noted treatment but pain was not improved.  I ordered additional hydromorphone , but pain was not relieved and she started vomiting.  I have reviewed her laboratory tests, my interpretation is stable anemia, normal WBC, borderline hypokalemia likely secondary to vomiting, elevated random glucose level, stable elevation of alkaline phosphatase.  Because of failure to respond, I do feel I need to proceed with CT of abdomen and pelvis.  Case is signed out to Dr. Towana.     Final diagnoses:  Epigastric pain  Nausea vomiting and diarrhea  Hypokalemia  Elevated alkaline phosphatase level  Normochromic normocytic anemia  Elevated random blood glucose level    ED Discharge Orders     None          Raford Lenis, MD 10/08/24 [phone]  "

## 2024-10-08 NOTE — ED Provider Notes (Signed)
 Signout from Dr. Raford.  39 year old female here with epigastric right upper abdominal pain.  Labs without significant abnormalities.  Pending CT. Physical Exam  BP 107/61   Pulse 73   Temp 98.3 F (36.8 C) (Oral)   Resp 16   Ht 5' (1.524 m)   Wt 80.3 kg   LMP 09/13/2024 (Approximate)   SpO2 95%   BMI 34.57 kg/m   Physical Exam  Procedures  Procedures  ED Course / MDM    Medical Decision Making Amount and/or Complexity of Data Reviewed Labs: ordered. Radiology: ordered.  Risk Prescription drug management.   7:20 AM.  CT without acute findings.  Reviewed results with patient via Spanish interpreter iPad [phone].  She does not have a PCP or insurance.  Given contact information for outpatient GI.       Lorraine Ozell BROCKS, MD 10/08/24 1700

## 2024-10-09 ENCOUNTER — Emergency Department (HOSPITAL_COMMUNITY)
Admission: EM | Admit: 2024-10-09 | Discharge: 2024-10-09 | Disposition: A | Discharge status: Home / Self Care | Payer: Self-pay | Attending: Emergency Medicine | Admitting: Emergency Medicine

## 2024-10-09 ENCOUNTER — Encounter (HOSPITAL_COMMUNITY): Payer: Self-pay

## 2024-10-09 ENCOUNTER — Emergency Department (HOSPITAL_COMMUNITY): Payer: Self-pay

## 2024-10-09 DIAGNOSIS — R7401 Elevation of levels of liver transaminase levels: Secondary | ICD-10-CM | POA: Insufficient documentation

## 2024-10-09 DIAGNOSIS — R1084 Generalized abdominal pain: Secondary | ICD-10-CM | POA: Insufficient documentation

## 2024-10-09 DIAGNOSIS — R112 Nausea with vomiting, unspecified: Secondary | ICD-10-CM | POA: Insufficient documentation

## 2024-10-09 DIAGNOSIS — J45909 Unspecified asthma, uncomplicated: Secondary | ICD-10-CM | POA: Insufficient documentation

## 2024-10-09 LAB — COMPREHENSIVE METABOLIC PANEL WITH GFR
ALT: 53 U/L — ABNORMAL HIGH (ref 0–44)
AST: 46 U/L — ABNORMAL HIGH (ref 15–41)
Albumin: 4.3 g/dL (ref 3.5–5.0)
Alkaline Phosphatase: 147 U/L — ABNORMAL HIGH (ref 38–126)
Anion gap: 12 (ref 5–15)
BUN: 17 mg/dL (ref 6–20)
CO2: 22 mmol/L (ref 22–32)
Calcium: 9.4 mg/dL (ref 8.9–10.3)
Chloride: 105 mmol/L (ref 98–111)
Creatinine, Ser: 0.88 mg/dL (ref 0.44–1.00)
GFR, Estimated: >60 mL/min
Glucose, Bld: 121 mg/dL — ABNORMAL HIGH (ref 70–99)
Potassium: 3.8 mmol/L (ref 3.5–5.1)
Sodium: 139 mmol/L (ref 135–145)
Total Bilirubin: 0.2 mg/dL (ref 0.0–1.2)
Total Protein: 7.6 g/dL (ref 6.5–8.1)

## 2024-10-09 LAB — CBC WITH DIFFERENTIAL/PLATELET
Abs Immature Granulocytes: 0.01 K/uL (ref 0.00–0.07)
Basophils Absolute: 0.1 K/uL (ref 0.0–0.1)
Basophils Relative: 1 %
Eosinophils Absolute: 0.1 K/uL (ref 0.0–0.5)
Eosinophils Relative: 1 %
HCT: 38.3 % (ref 36.0–46.0)
Hemoglobin: 12.4 g/dL (ref 12.0–15.0)
Immature Granulocytes: 0 %
Lymphocytes Relative: 39 %
Lymphs Abs: 3 K/uL (ref 0.7–4.0)
MCH: 30.2 pg (ref 26.0–34.0)
MCHC: 32.4 g/dL (ref 30.0–36.0)
MCV: 93.4 fL (ref 80.0–100.0)
Monocytes Absolute: 0.4 K/uL (ref 0.1–1.0)
Monocytes Relative: 5 %
Neutro Abs: 4.2 K/uL (ref 1.7–7.7)
Neutrophils Relative %: 54 %
Platelets: 284 K/uL (ref 150–400)
RBC: 4.1 MIL/uL (ref 3.87–5.11)
RDW: 12.7 % (ref 11.5–15.5)
WBC: 7.8 K/uL (ref 4.0–10.5)
nRBC: 0 % (ref 0.0–0.2)

## 2024-10-09 LAB — URINALYSIS, ROUTINE W REFLEX MICROSCOPIC
Bacteria, UA: NONE SEEN
Bilirubin Urine: NEGATIVE
Glucose, UA: NEGATIVE mg/dL
Ketones, ur: NEGATIVE mg/dL
Leukocytes,Ua: NEGATIVE
Nitrite: NEGATIVE
Protein, ur: NEGATIVE mg/dL
Specific Gravity, Urine: 1.024 (ref 1.005–1.030)
pH: 5 (ref 5.0–8.0)

## 2024-10-09 LAB — PREGNANCY, URINE: Preg Test, Ur: NEGATIVE

## 2024-10-09 MED ORDER — DIPHENHYDRAMINE HCL 50 MG/ML IJ SOLN
25.0000 mg | Freq: Once | INTRAMUSCULAR | Status: AC
  Administered 2024-10-09: 25 mg via INTRAVENOUS
  Filled 2024-10-09: qty 1

## 2024-10-09 MED ORDER — DICYCLOMINE HCL 10 MG PO CAPS
10.0000 mg | ORAL_CAPSULE | Freq: Once | ORAL | Status: AC
  Administered 2024-10-09: 10 mg via ORAL
  Filled 2024-10-09: qty 1

## 2024-10-09 MED ORDER — SODIUM CHLORIDE 0.9 % IV BOLUS
1000.0000 mL | Freq: Once | INTRAVENOUS | Status: AC
  Administered 2024-10-09: 1000 mL via INTRAVENOUS

## 2024-10-09 MED ORDER — ONDANSETRON 4 MG PO TBDP: 4.0000 mg | ORAL_TABLET | Freq: Three times a day (TID) | ORAL | 0 refills | Status: DC | PRN

## 2024-10-09 MED ORDER — ONDANSETRON 4 MG PO TBDP: 4.0000 mg | ORAL_TABLET | Freq: Three times a day (TID) | ORAL | 0 refills | Status: AC | PRN

## 2024-10-09 MED ORDER — DROPERIDOL 2.5 MG/ML IJ SOLN
1.2500 mg | Freq: Once | INTRAMUSCULAR | Status: AC
  Administered 2024-10-09: 1.25 mg via INTRAVENOUS
  Filled 2024-10-09: qty 2

## 2024-10-09 MED ORDER — OXYCODONE-ACETAMINOPHEN 5-325 MG PO TABS
1.0000 | ORAL_TABLET | Freq: Once | ORAL | Status: AC
  Administered 2024-10-09: 1 via ORAL
  Filled 2024-10-09: qty 1

## 2024-10-09 NOTE — ED Notes (Signed)
Pt stated she was leaving.  

## 2024-10-09 NOTE — ED Provider Notes (Signed)
 Received patient at signout from previous provider pending p.o. challenge, reassessment.  See their note.  In short, patient presents to ED for evaluation of generalized abdominal pain, worse in LLQ and associated with nausea, vomiting.  Was seen 12 hours ago with complaints of epigastric pain.  She had CT abdomen pelvis without contrast that showed no acute findings.   Vital signs hemodynamic stable no fever no tachycardia  Labs notable for large Hgb and UA but otherwise noninfectious.  CBG 121.  Mild transaminitis per patient's baseline.  AST 46 ALT 53 ALP 147.  hCG negative.    TVUS negative for ovarian torsion but does show stable bilateral ovarian cysts.  Was provided Percocet, bentyl  for pain.  Droperidol , zofran , IVF for hydration and nausea, vomiting  Passes po challenge   Physical Exam  BP 135/87 (BP Location: Right Arm)   Pulse 71   Temp 98.8 F (37.1 C)   Resp 17   LMP 09/13/2024 (Approximate)   SpO2 98%    ED Course / MDM    Medical Decision Making Risk Prescription drug management.   Vital signs remained hemodynamically stable.  Patient appears stable for discharge.  She has had 3 CT of her abdomen within this month alone with no acute findings. Pain and nausea improved following analgesia and antiemetics. She has bentyl  prescription. Will provide her with zofran  prescription for nausea. She has been seen multiple times for abdominal pain, nausea, vomiting.  It is imperative that she follows up with GI.  We have given her GI follow-up for further management.  Discussed ED workup, disposition, return to ED precautions with patient who expresses understanding agrees with plan.  All questions answered to their satisfaction.  They are agreeable to plan.  Discharge instructions provided on paperwork    Minnie Tinnie BRAVO, PA 10/09/24 1754    Dean Clarity, MD 10/10/24 1742

## 2024-10-09 NOTE — ED Triage Notes (Signed)
 Pt reports LLQ pain with n/v/d that started yesterday morning. Pt was seen at [facility] yesterday and was told that her appendix was inflamed. Pt here yesterday but left due to long wait time. Pain continues to increase.

## 2024-10-09 NOTE — Discharge Instructions (Addendum)
 You need to call gastroenterology to schedule an appointment for follow-up.  Please return to the ED if your symptoms worsen.  Debe llamar al servicio de gastroenterologa para programar una cita de seguimiento. Por favor, regrese al servicio de urgencias si sus sntomas empeoran.

## 2024-10-09 NOTE — ED Provider Triage Note (Signed)
 Emergency Medicine Provider Triage Evaluation Note  Lorraine [name] , a 39 y.o. female  was evaluated in triage.  Pt complains of worsening lower abdominal pain going on for the last few days.  Seen a few days ago at [facility] with negative imaging and labs.  Seen yesterday at [facility].  Right lower quadrant ultrasound indeterminant.  No urinary symptoms.  Last menstrual period 3 weeks ago.  Does not have a PCP.  Review of Systems  Positive: Abdominal pain Negative: Fever  Physical Exam  BP 135/87 (BP Location: Right Arm)   Pulse 71   Temp 98.8 F (37.1 C)   Resp 17   LMP 09/13/2024 (Approximate)   SpO2 98%  Gen:   Awake, no distress   Resp:  Normal effort  MSK:   Moves extremities without difficulty  Other:    Medical Decision Making  Medically screening exam initiated at 11:50 AM.  Appropriate orders placed.  Lorraine [name] was informed that the remainder of the evaluation will be completed by another provider, this initial triage assessment does not replace that evaluation, and the importance of remaining in the ED until their evaluation is complete.     Towana Ozell BROCKS, MD 10/09/24 [phone]

## 2024-10-09 NOTE — ED Provider Notes (Signed)
 " [place] [facility] AT [facility] Provider Note   CSN: [id number] Arrival date & time: 10/09/24  1135     Patient presents with: No chief complaint on file.   Lorraine [name] is a 39 y.o. female.  Patient with past medical history of diverticulitis, pancreatitis, pyelonephritis, asthma, status post cholecystectomy comes into the emergency room today complaining of abdominal pain generalized but worse in LLQ and associated with NV.  Of note she was seen about 12 hours ago with complaint of epigastric pain and right lower quadrant pain. No fever. No urinary symptoms. Dry heaving.    HPI     Prior to Admission medications  Medication Sig Start Date End Date Taking? Authorizing Provider  acetaminophen  (TYLENOL  8 HOUR) 650 MG CR tablet Take 1 tablet (650 mg total) by mouth every 8 (eight) hours as needed for pain or fever. 10/04/24   Willma Duwaine CROME, PA  albuterol  (VENTOLIN  HFA) 108 (90 Base) MCG/ACT inhaler Inhale 2 puffs into the lungs every 4 (four) hours as needed for wheezing or shortness of breath. 08/25/22   Suzette Pac, MD  ciprofloxacin  (CIPRO ) 500 MG tablet Take 1 tablet (500 mg total) by mouth every 12 (twelve) hours. 06/05/24   Raford Lenis, MD  dicyclomine  (BENTYL ) 20 MG tablet Take 1 tablet (20 mg total) by mouth 2 (two) times daily. 10/04/24   Lloyd, Megan L, PA  ibuprofen  (ADVIL ) 600 MG tablet Take 1 tablet (600 mg total) by mouth every 6 (six) hours as needed. 09/25/24   Dean Clarity, MD  ibuprofen  (ADVIL ) 800 MG tablet Take 1 tablet (800 mg total) by mouth every 8 (eight) hours as needed for moderate pain (pain score 4-6). 09/01/24   Suzette Pac, MD  levofloxacin  (LEVAQUIN ) 500 MG tablet Take 1 tablet (500 mg total) by mouth daily. X 7 days 09/01/24   Suzette Pac, MD  megestrol  (MEGACE ) 40 MG tablet Take 1 tablet (40 mg total) by mouth daily. 08/22/24   Ula Prentice SAUNDERS, MD  methocarbamol  (ROBAXIN ) 500 MG tablet Take 1 tablet (500 mg total) by mouth  2 (two) times daily. 08/13/24   Idol, Julie, PA-C  naproxen  (NAPROSYN ) 500 MG tablet Take 1 tablet (500 mg total) by mouth 2 (two) times daily with a meal. 09/26/24   Cleotilde Rogue, MD  pantoprazole  (PROTONIX ) 20 MG tablet Take 2 tablets (40 mg total) by mouth daily. 09/30/24 10/30/24  Kammerer, Megan L, DO  pantoprazole  (PROTONIX ) 40 MG tablet Take 1 tablet (40 mg total) by mouth daily. 06/09/24   Suellen Cantor A, PA-C  phenazopyridine  (PYRIDIUM ) 200 MG tablet Take 1 tablet (200 mg total) by mouth 3 (three) times daily. 09/25/24   Dean Clarity, MD  sucralfate  (CARAFATE ) 1 g tablet Take 1 tablet (1 g total) by mouth 4 (four) times daily -  with meals and at bedtime. 09/30/24 10/10/24  Kammerer, Megan L, DO  cetirizine  (ZYRTEC  ALLERGY) 10 MG tablet Take 1 tablet (10 mg total) by mouth 2 (two) times daily. Patient not taking: Reported on 12/06/2020 11/12/20 01/24/21  Muthersbaugh, Chiquita, PA-C    Allergies: Iodinated contrast media, Lupine bean extract, Ceftriaxone , and Morphine     Review of Systems  Gastrointestinal:  Positive for abdominal pain.    Updated Vital Signs BP 135/87 (BP Location: Right Arm)   Pulse 71   Temp 98.8 F (37.1 C)   Resp 17   LMP 09/13/2024 (Approximate)   SpO2 98%   Physical Exam Vitals and nursing note reviewed.  Constitutional:      General: She is not in acute distress.    Appearance: She is not toxic-appearing.  HENT:     Head: Normocephalic and atraumatic.  Eyes:     General: No scleral icterus.    Conjunctiva/sclera: Conjunctivae normal.  Cardiovascular:     Rate and Rhythm: Normal rate and regular rhythm.     Pulses: Normal pulses.     Heart sounds: Normal heart sounds.  Pulmonary:     Effort: Pulmonary effort is normal. No respiratory distress.     Breath sounds: Normal breath sounds.  Abdominal:     General: Abdomen is flat. Bowel sounds are normal.     Palpations: Abdomen is soft.     Tenderness: There is no abdominal tenderness.  Skin:     General: Skin is warm and dry.     Findings: No lesion.  Neurological:     General: No focal deficit present.     Mental Status: She is alert and oriented to person, place, and time. Mental status is at baseline.     (all labs ordered are listed, but only abnormal results are displayed) Labs Reviewed  COMPREHENSIVE METABOLIC PANEL WITH GFR - Abnormal; Notable for the following components:      Result Value   Glucose, Bld 121 (*)    AST 46 (*)    ALT 53 (*)    Alkaline Phosphatase 147 (*)    All other components within normal limits  URINALYSIS, ROUTINE W REFLEX MICROSCOPIC - Abnormal; Notable for the following components:   APPearance HAZY (*)    Hgb urine dipstick LARGE (*)    All other components within normal limits  CBC WITH DIFFERENTIAL/PLATELET  PREGNANCY, URINE    EKG: None  Radiology: US  APPENDIX (ABDOMEN LIMITED) Result Date: 10/08/2024 CLINICAL DATA:  Lower abdominal pain. EXAM: ULTRASOUND ABDOMEN LIMITED TECHNIQUE: Elnor scale imaging of the right lower quadrant was performed to evaluate for suspected appendicitis. Standard imaging planes and graded compression technique were utilized. COMPARISON:  None Available. FINDINGS: The appendix is not visualized. Ancillary findings: Tenderness to transducer pressure is noted by the ultrasound technologist. Factors affecting image quality: Patient body habitus. Other findings: None. IMPRESSION: Non visualization of the appendix. Non-visualization of appendix by US  does not definitely exclude appendicitis. If there is sufficient clinical concern, consider abdomen pelvis CT with contrast for further evaluation. Electronically Signed   By: Suzen Dials M.D.   On: 10/08/2024 18:38   CT ABDOMEN PELVIS WO CONTRAST Result Date: 10/08/2024 EXAM: CT ABDOMEN AND PELVIS WITHOUT CONTRAST 10/08/2024 06:44:29 AM TECHNIQUE: CT of the abdomen and pelvis was performed without the administration of intravenous contrast. Multiplanar reformatted  images are provided for review. Automated exposure control, iterative reconstruction, and/or weight-based adjustment of the mA/kV was utilized to reduce the radiation dose to as low as reasonably achievable. COMPARISON: Large number of CTs. Comparison is made to the two most recent CTs, both without contrast from 10/04/2024 and 09/26/2024. CLINICAL HISTORY: Right lower quadrant pain onset tonight with nausea, vomiting, and a suspected small bowel obstruction. FINDINGS: LOWER CHEST: No acute abnormality. LIVER: The liver is mildly enlarged and mildly steatotic. No focal abnormality is seen without contrast. GALLBLADDER AND BILE DUCTS: The gallbladder is surgically absent. No biliary ductal dilatation. SPLEEN: The spleen is normal in size and noncontrast attenuation. PANCREAS: The pancreas is normal in size and contour. No ductal dilatation. ADRENAL GLANDS: There is no adrenal mass. KIDNEYS, URETERS AND BLADDER: There is no contour deforming  renal mass. No urinary stone or findings of obstructive uropathy. No stones in the kidneys or ureters. No hydronephrosis. No perinephric or periureteral stranding. Urinary bladder is unremarkable. GI AND BOWEL: Mild fluid filling in the stomach without wall thickening. There is no bowel obstruction or inflammation. There is diverticulosis in the descending and sigmoid colon without acute inflammatory changes. PERITONEUM AND RETROPERITONEUM: No free fluid, free hemorrhage, or free air. VASCULATURE: Aorta is normal in caliber. LYMPH NODES: No lymphadenopathy. REPRODUCTIVE ORGANS: The uterus and ovaries are not enlarged. There are displaced tubal ligation clips in the left pelvis ventral to the uterine fundus to the left. This has been seen previously. BONES AND SOFT TISSUES: No acute osseous abnormality. A small umbilical fat hernia. There was no incarcerated hernia. No focal soft tissue abnormality. IMPRESSION: 1. No acute noncontrast CT findings in the abdomen or pelvis. 2.  Diverticulosis without evidence of acute diverticulitis. 3. No urinary stones on the current or prior studies. 4. Fatty infiltration in the liver. Electronically signed by: Francis Quam MD 10/08/2024 07:15 AM EST RP Workstation: [id number]     Procedures   Medications Ordered in the ED  oxyCODONE -acetaminophen  (PERCOCET/ROXICET) 5-325 MG per tablet 1 tablet (1 tablet Oral Given 10/09/24 1152)                                    Medical Decision Making Risk Prescription drug management.   This patient presents to the ED for concern of abdominal pain, this involves an extensive number of treatment options, and is a complaint that carries with it a high risk of complications and morbidity.  The differential diagnosis includes cholecystitis, AAA, appendicitis, renal stone, UTI   Co morbidities that complicate the patient evaluation  History of diverticulitis, history of abdominal pain, prediabetes   Additional history obtained:  Additional history obtained from seems to have been seen for the same thing Jan 14, 18, 22, and also earlier today 9 CT scans of abd/pelvis within the last 1 year   Lab Tests:  I personally interpreted labs.  The pertinent results include:   CBC shows no leukocytosis and no anemia.  CMP shows baseline elevation in liver enzymes, normal total bilirubin Urine without bacteria and negative for nitrites leukocytes   Imaging Studies ordered:  I ordered imaging studies including pelvic US  ordered in triage.   I independently visualized and interpreted imaging which showed no acute findings, no evidence of ovarian torsion.  I agree with the radiologist interpretation   Cardiac Monitoring: / EKG:  The patient was maintained on a cardiac monitor.     Problem List / ED Course / Critical interventions / Medication management  Patient presents to emergency room with complaint of abdominal pain.  She does admit that she has chronic abdominal pain and she  reports that today she started having mostly left lower quadrant abdominal pain and nausea and vomiting.  She reports that she is having a hard time keeping food down and that is why she came here.  On my exam she has no focal area of abdominal tenderness and no rebound tenderness.  Her vitals are stable and she is well-appearing.  She is primarily complaining of nausea and vomiting.  Her lab work is overall reassuring although she does have some baseline elevation in her liver enzymes.  In triage she had normal pelvic ultrasound and normal Doppler.  She denies any urinary symptoms or vaginal  discharge so I do not suspect tubo-ovarian abscess or pelvic inflammatory disease at this time. I ordered medication including droperidol  and fluids and reassess I have reviewed the patients home medicines and have made adjustments as needed. Patient has been stable throughout stay will trial antiemetic and p.o. challenge with hopeful discharge with GI follow-up.      Final diagnoses:  Nausea and vomiting, unspecified vomiting type    ED Discharge Orders     None          Shermon Warren SAILOR, PA-C 10/09/24 1526    Emil Share, DO 10/09/24 1530  "

## 2024-10-12 ENCOUNTER — Telehealth (HOSPITAL_COMMUNITY): Payer: Self-pay | Admitting: Emergency Medicine

## 2024-10-12 MED ORDER — ONDANSETRON 4 MG PO TBDP: 4.0000 mg | ORAL_TABLET | Freq: Three times a day (TID) | ORAL | 0 refills | Status: AC | PRN

## 2024-10-14 ENCOUNTER — Encounter (HOSPITAL_COMMUNITY): Payer: Self-pay

## 2024-10-14 ENCOUNTER — Other Ambulatory Visit: Payer: Self-pay

## 2024-10-14 ENCOUNTER — Emergency Department (HOSPITAL_COMMUNITY)
Admission: EM | Admit: 2024-10-14 | Discharge: 2024-10-14 | Disposition: A | Discharge status: Home / Self Care | Payer: Self-pay | Attending: Emergency Medicine | Admitting: Emergency Medicine

## 2024-10-14 DIAGNOSIS — D72829 Elevated white blood cell count, unspecified: Secondary | ICD-10-CM | POA: Insufficient documentation

## 2024-10-14 DIAGNOSIS — G8929 Other chronic pain: Secondary | ICD-10-CM | POA: Insufficient documentation

## 2024-10-14 DIAGNOSIS — R1033 Periumbilical pain: Secondary | ICD-10-CM | POA: Insufficient documentation

## 2024-10-14 DIAGNOSIS — R1031 Right lower quadrant pain: Secondary | ICD-10-CM | POA: Insufficient documentation

## 2024-10-14 LAB — URINALYSIS, ROUTINE W REFLEX MICROSCOPIC
Bilirubin Urine: NEGATIVE
Glucose, UA: NEGATIVE mg/dL
Hgb urine dipstick: NEGATIVE
Ketones, ur: NEGATIVE mg/dL
Leukocytes,Ua: NEGATIVE
Nitrite: NEGATIVE
Protein, ur: NEGATIVE mg/dL
Specific Gravity, Urine: 1.014 (ref 1.005–1.030)
pH: 6 (ref 5.0–8.0)

## 2024-10-14 LAB — COMPREHENSIVE METABOLIC PANEL WITH GFR
ALT: 33 U/L (ref 0–44)
AST: 29 U/L (ref 15–41)
Albumin: 4.1 g/dL (ref 3.5–5.0)
Alkaline Phosphatase: 136 U/L — ABNORMAL HIGH (ref 38–126)
Anion gap: 13 (ref 5–15)
BUN: 15 mg/dL (ref 6–20)
CO2: 20 mmol/L — ABNORMAL LOW (ref 22–32)
Calcium: 9.3 mg/dL (ref 8.9–10.3)
Chloride: 104 mmol/L (ref 98–111)
Creatinine, Ser: 0.72 mg/dL (ref 0.44–1.00)
GFR, Estimated: >60 mL/min
Glucose, Bld: 95 mg/dL (ref 70–99)
Potassium: 3.9 mmol/L (ref 3.5–5.1)
Sodium: 137 mmol/L (ref 135–145)
Total Bilirubin: <0.2 mg/dL (ref 0.0–1.2)
Total Protein: 7.2 g/dL (ref 6.5–8.1)

## 2024-10-14 LAB — CBC WITH DIFFERENTIAL/PLATELET
Abs Immature Granulocytes: 0.03 10*3/uL (ref 0.00–0.07)
Basophils Absolute: 0.1 10*3/uL (ref 0.0–0.1)
Basophils Relative: 1 %
Eosinophils Absolute: 0.2 10*3/uL (ref 0.0–0.5)
Eosinophils Relative: 1 %
HCT: 35.9 % — ABNORMAL LOW (ref 36.0–46.0)
Hemoglobin: 11.7 g/dL — ABNORMAL LOW (ref 12.0–15.0)
Immature Granulocytes: 0 %
Lymphocytes Relative: 36 %
Lymphs Abs: 3.8 10*3/uL (ref 0.7–4.0)
MCH: 30.1 pg (ref 26.0–34.0)
MCHC: 32.6 g/dL (ref 30.0–36.0)
MCV: 92.3 fL (ref 80.0–100.0)
Monocytes Absolute: 0.6 10*3/uL (ref 0.1–1.0)
Monocytes Relative: 5 %
Neutro Abs: 6.1 10*3/uL (ref 1.7–7.7)
Neutrophils Relative %: 57 %
Platelets: UNDETERMINED 10*3/uL (ref 150–400)
RBC: 3.89 MIL/uL (ref 3.87–5.11)
RDW: 12.7 % (ref 11.5–15.5)
WBC: 10.8 10*3/uL — ABNORMAL HIGH (ref 4.0–10.5)
nRBC: 0 % (ref 0.0–0.2)

## 2024-10-14 LAB — HCG, SERUM, QUALITATIVE: Preg, Serum: NEGATIVE

## 2024-10-14 LAB — LIPASE, BLOOD: Lipase: 35 U/L (ref 11–51)

## 2024-10-14 MED ORDER — KETOROLAC TROMETHAMINE 15 MG/ML IJ SOLN
15.0000 mg | Freq: Once | INTRAMUSCULAR | Status: AC
  Administered 2024-10-14: 15 mg via INTRAVENOUS
  Filled 2024-10-14: qty 1

## 2024-10-14 MED ORDER — SUCRALFATE 1 G PO TABS: 1.0000 g | ORAL_TABLET | Freq: Three times a day (TID) | ORAL | 0 refills | Status: AC

## 2024-10-14 MED ORDER — PROCHLORPERAZINE EDISYLATE 10 MG/2ML IJ SOLN
5.0000 mg | Freq: Once | INTRAMUSCULAR | Status: AC
  Administered 2024-10-14: 5 mg via INTRAVENOUS
  Filled 2024-10-14: qty 2

## 2024-10-14 MED ORDER — DICYCLOMINE HCL 20 MG PO TABS: 20.0000 mg | ORAL_TABLET | Freq: Two times a day (BID) | ORAL | 0 refills | Status: AC

## 2024-10-14 MED ORDER — PANTOPRAZOLE SODIUM 40 MG IV SOLR
40.0000 mg | Freq: Once | INTRAVENOUS | Status: AC
  Administered 2024-10-14: 40 mg via INTRAVENOUS
  Filled 2024-10-14: qty 10

## 2024-10-14 MED ORDER — PANTOPRAZOLE SODIUM 20 MG PO TBEC: 40.0000 mg | DELAYED_RELEASE_TABLET | Freq: Every day | ORAL | 0 refills | Status: AC

## 2024-10-14 MED ORDER — SUCRALFATE 1 GM/10ML PO SUSP
1.0000 g | Freq: Once | ORAL | Status: AC
  Administered 2024-10-14: 1 g via ORAL
  Filled 2024-10-14: qty 10

## 2024-10-14 NOTE — ED Triage Notes (Signed)
 Pt reports with right sided abdominal pain, vomiting, and diarrhea x 2 days.

## 2024-10-14 NOTE — ED Provider Notes (Signed)
 " [place] EMERGENCY DEPARTMENT AT [facility] Provider Note  CSN: [id number] Arrival date & time: 10/14/24 9960  Chief Complaint(s) Abdominal Pain  History provided by patient. HPI & MDM Lorraine [name] is a 39 y.o. female .   Abdominal Pain  Periumbilical and right sided. On going for several months constantly but fluctuating in nature.  Worse over the past couple days. Patient has had numerous reassuring CT scans in the last several months(last of which was 2 days ago at [facility] ) as well as ultrasound (last of which was 4 days ago in our emergency department). Patient was referred to gastroenterologist but was unable to make the appointment due to freezing weather.  Patient was also prescribed several medications which she has not been able to pick up.  She has been taking Zofran  which she believes will help with her pain. Reports nausea, vomiting and loose BM.    Medical Decision Making Amount and/or Complexity of Data Reviewed Labs: ordered. Decision-making details documented in ED Course.  Risk Prescription drug management.    The patient's presentation involves an extensive number of treatment options, and the complaint(s) carry with it a high risk of complications and morbidity. The differential diagnosis includes but not limited to those listed below:  Right-sided abdominal pain.   CBC with mild leukocytosis and anemia. CMP without significant electrolyte derangements and renal insufficiency.  No evidence of bili obstruction or pancreatitis. hCG negative ruling out pregnancy related process. UA negative for infection.  Given the patient's reassuring imaging recently, I have low suspicion for serious intra-abdominal inflammatory/infectious process or bowel obstruction requiring advanced imaging at this time.  Doubt GU etiology.  Provided with medication as noted below which did provide relief.   Final Clinical Impression(s) / ED Diagnoses Final diagnoses:   Chronic abdominal pain   The patient appears reasonably screened and/or stabilized for discharge and I doubt any other medical condition or other [facility] requiring further screening, evaluation, or treatment in the ED at this time. I have discussed the findings, Dx and Tx plan with the patient/family who expressed understanding and agree(s) with the plan. Discharge instructions discussed at length. The patient/family was given strict return precautions who verbalized understanding of the instructions. No further questions at time of discharge.  Disposition: Discharge  Condition: Good  ED Discharge Orders          Ordered    sucralfate  (CARAFATE ) 1 g tablet  3 times daily with meals & bedtime        10/14/24 0409    pantoprazole  (PROTONIX ) 20 MG tablet  Daily        10/14/24 0409    dicyclomine  (BENTYL ) 20 MG tablet  2 times daily        10/14/24 0409              Follow Up: Primary care provider  Call  to schedule an appointment for close follow up     Past Medical History Past Medical History:  Diagnosis Date   Angio-edema    Asthma    Inhaler used 01/02/16   Diverticulitis    Nausea & vomiting 04/02/2017   Pancreatitis    Pre-diabetes    Pyelonephritis    Urticaria    Patient Active Problem List   Diagnosis Date Noted   Intractable vomiting    Acute diverticulitis 09/20/2018   Cervical radiculopathy    Left arm weakness    Left-sided weakness 02/19/2018   Weakness 02/19/2018   Rash 02/09/2018  Chest pain 02/08/2018   Acute pancreatitis 04/02/2017   Abdominal pain, acute, left upper quadrant 04/02/2017   Intractable nausea and vomiting 04/02/2017   Asthma in adult 04/02/2017   Obesity (BMI 30.0-34.9) 04/02/2017   Fatty infiltration of liver 04/02/2017   Gastroesophageal reflux disease without esophagitis    Pre-diabetes 07/15/2015   Diarrhea    Hypokalemia    Bacterial vaginosis    Pyelonephritis 07/07/2015   Abdominal pain, right upper quadrant     Asthma 10/31/2014   History of preterm delivery 10/22/2014   Status post repeat low transverse cesarean section 09/06/2014   Diverticulitis 09/10/2012   Home Medication(s) Prior to Admission medications  Medication Sig Start Date End Date Taking? Authorizing Provider  acetaminophen  (TYLENOL  8 HOUR) 650 MG CR tablet Take 1 tablet (650 mg total) by mouth every 8 (eight) hours as needed for pain or fever. 10/04/24   Willma Duwaine CROME, PA  albuterol  (VENTOLIN  HFA) 108 (90 Base) MCG/ACT inhaler Inhale 2 puffs into the lungs every 4 (four) hours as needed for wheezing or shortness of breath. 08/25/22   Suzette Pac, MD  ciprofloxacin  (CIPRO ) 500 MG tablet Take 1 tablet (500 mg total) by mouth every 12 (twelve) hours. 06/05/24   Raford Lenis, MD  dicyclomine  (BENTYL ) 20 MG tablet Take 1 tablet (20 mg total) by mouth 2 (two) times daily. 10/14/24   Trine Raynell Moder, MD  ibuprofen  (ADVIL ) 600 MG tablet Take 1 tablet (600 mg total) by mouth every 6 (six) hours as needed. 09/25/24   Dean Clarity, MD  ibuprofen  (ADVIL ) 800 MG tablet Take 1 tablet (800 mg total) by mouth every 8 (eight) hours as needed for moderate pain (pain score 4-6). 09/01/24   Suzette Pac, MD  levofloxacin  (LEVAQUIN ) 500 MG tablet Take 1 tablet (500 mg total) by mouth daily. X 7 days 09/01/24   Suzette Pac, MD  megestrol  (MEGACE ) 40 MG tablet Take 1 tablet (40 mg total) by mouth daily. 08/22/24   Ula Prentice SAUNDERS, MD  methocarbamol  (ROBAXIN ) 500 MG tablet Take 1 tablet (500 mg total) by mouth 2 (two) times daily. 08/13/24   Idol, Julie, PA-C  naproxen  (NAPROSYN ) 500 MG tablet Take 1 tablet (500 mg total) by mouth 2 (two) times daily with a meal. 09/26/24   Cleotilde Rogue, MD  ondansetron  (ZOFRAN -ODT) 4 MG disintegrating tablet Take 1 tablet (4 mg total) by mouth every 8 (eight) hours as needed for nausea or vomiting. 10/09/24   Minnie Tinnie BRAVO, PA  ondansetron  (ZOFRAN -ODT) 4 MG disintegrating tablet Take 1 tablet (4 mg total) by mouth  every 8 (eight) hours as needed for up to 5 days for nausea or vomiting. 10/12/24 10/17/24  Pamella Ozell LABOR, DO  pantoprazole  (PROTONIX ) 20 MG tablet Take 2 tablets (40 mg total) by mouth daily. 10/14/24 11/13/24  Trine Raynell Moder, MD  phenazopyridine  (PYRIDIUM ) 200 MG tablet Take 1 tablet (200 mg total) by mouth 3 (three) times daily. 09/25/24   Dean Clarity, MD  sucralfate  (CARAFATE ) 1 g tablet Take 1 tablet (1 g total) by mouth 4 (four) times daily -  with meals and at bedtime. 10/14/24 10/24/24  Trine Raynell Moder, MD  cetirizine  (ZYRTEC  ALLERGY) 10 MG tablet Take 1 tablet (10 mg total) by mouth 2 (two) times daily. Patient not taking: Reported on 12/06/2020 11/12/20 01/24/21  Muthersbaugh, Chiquita, PA-C  Allergies Iodinated contrast media, Lupine bean extract, Ceftriaxone , and Morphine   Review of Systems Review of Systems  Gastrointestinal:  Positive for abdominal pain.   As noted in HPI  Physical Exam Vital Signs  I have reviewed the triage vital signs BP (!) 150/95   Pulse 78   Temp 98.3 F (36.8 C) (Oral)   Resp 16   LMP 09/13/2024 (Approximate)   SpO2 100%   Physical Exam Vitals reviewed.  Constitutional:      General: She is not in acute distress.    Appearance: She is well-developed. She is not diaphoretic.  HENT:     Head: Normocephalic and atraumatic.     Right Ear: External ear normal.     Left Ear: External ear normal.     Nose: Nose normal.  Eyes:     General: No scleral icterus.    Conjunctiva/sclera: Conjunctivae normal.  Neck:     Trachea: Phonation normal.  Cardiovascular:     Rate and Rhythm: Normal rate and regular rhythm.  Pulmonary:     Effort: Pulmonary effort is normal. No respiratory distress.     Breath sounds: No stridor.  Abdominal:     General: There is no distension.     Tenderness: There is abdominal  tenderness (mild) in the right lower quadrant, periumbilical area and suprapubic area.  Musculoskeletal:        General: Normal range of motion.     Cervical back: Normal range of motion.  Neurological:     Mental Status: She is alert and oriented to person, place, and time.  Psychiatric:        Behavior: Behavior normal.     ED Results and Treatments Labs (all labs ordered are listed, but only abnormal results are displayed) Labs Reviewed  COMPREHENSIVE METABOLIC PANEL WITH GFR - Abnormal; Notable for the following components:      Result Value   CO2 20 (*)    Alkaline Phosphatase 136 (*)    All other components within normal limits  CBC WITH DIFFERENTIAL/PLATELET - Abnormal; Notable for the following components:   WBC 10.8 (*)    Hemoglobin 11.7 (*)    HCT 35.9 (*)    All other components within normal limits  LIPASE, BLOOD  URINALYSIS, ROUTINE W REFLEX MICROSCOPIC  HCG, SERUM, QUALITATIVE                                                                                                                         EKG  EKG Interpretation Date/Time:    Ventricular Rate:    PR Interval:    QRS Duration:    QT Interval:    QTC Calculation:   R Axis:      Text Interpretation:         Radiology No results found.  Medications Ordered in ED Medications  prochlorperazine  (COMPAZINE ) injection 5 mg (5 mg Intravenous Given 10/14/24 0221)  pantoprazole  (PROTONIX ) injection 40 mg (40 mg Intravenous Given 10/14/24 0221)  sucralfate  (CARAFATE ) 1 GM/10ML suspension 1 g (1 g Oral Given 10/14/24 0221)  ketorolac  (TORADOL ) 15 MG/ML injection 15 mg (15 mg Intravenous Given 10/14/24 0221)   Procedures Procedures  (including critical care time)   This chart was dictated using voice recognition software.  Despite best efforts to proofread,  errors can occur which can change the documentation meaning.   Trine Raynell Moder, MD 10/14/24 [phone]  "

## 2024-11-12 ENCOUNTER — Ambulatory Visit: Payer: Self-pay | Admitting: Gastroenterology
